# Patient Record
Sex: Female | Born: 1950
Health system: Southern US, Community
[De-identification: ages and names within clinical notes are randomized; demographics above are authoritative.]

## PROBLEM LIST (undated history)

## (undated) DIAGNOSIS — K469 Unspecified abdominal hernia without obstruction or gangrene: Secondary | ICD-10-CM

## (undated) DIAGNOSIS — K743 Primary biliary cirrhosis: Secondary | ICD-10-CM

## (undated) DIAGNOSIS — E039 Hypothyroidism, unspecified: Secondary | ICD-10-CM

## (undated) DIAGNOSIS — I10 Essential (primary) hypertension: Secondary | ICD-10-CM

## (undated) DIAGNOSIS — E079 Disorder of thyroid, unspecified: Secondary | ICD-10-CM

## (undated) DIAGNOSIS — Z1509 Genetic susceptibility to other malignant neoplasm: Secondary | ICD-10-CM

## (undated) DIAGNOSIS — M8580 Other specified disorders of bone density and structure, unspecified site: Secondary | ICD-10-CM

## (undated) DIAGNOSIS — D638 Anemia in other chronic diseases classified elsewhere: Secondary | ICD-10-CM

## (undated) DIAGNOSIS — C4491 Basal cell carcinoma of skin, unspecified: Secondary | ICD-10-CM

## (undated) DIAGNOSIS — K769 Liver disease, unspecified: Secondary | ICD-10-CM

## (undated) DIAGNOSIS — C189 Malignant neoplasm of colon, unspecified: Secondary | ICD-10-CM

## (undated) DIAGNOSIS — Z8679 Personal history of other diseases of the circulatory system: Secondary | ICD-10-CM

## (undated) DIAGNOSIS — K439 Ventral hernia without obstruction or gangrene: Secondary | ICD-10-CM

## (undated) DIAGNOSIS — Z87898 Personal history of other specified conditions: Secondary | ICD-10-CM

## (undated) DIAGNOSIS — Z1507 Genetic susceptibility to malignant neoplasm of urinary tract: Secondary | ICD-10-CM

## (undated) HISTORY — PX: HERNIA REPAIR: SHX51

## (undated) HISTORY — DX: Personal history of other diseases of the circulatory system: Z86.79

## (undated) HISTORY — DX: Malignant neoplasm of colon, unspecified: C18.9

## (undated) HISTORY — DX: Hypothyroidism, unspecified: E03.9

## (undated) HISTORY — DX: Ventral hernia without obstruction or gangrene: K43.9

## (undated) HISTORY — PX: ABDOMINAL HYSTERECTOMY: SHX81

## (undated) HISTORY — PX: UPPER GASTROINTESTINAL ENDOSCOPY: SHX188

## (undated) HISTORY — DX: Other specified disorders of bone density and structure, unspecified site: M85.80

## (undated) HISTORY — PX: EYE SURGERY: SHX253

## (undated) HISTORY — DX: Basal cell carcinoma of skin, unspecified: C44.91

## (undated) HISTORY — DX: Disorder of thyroid, unspecified: E07.9

## (undated) HISTORY — DX: Liver disease, unspecified: K76.9

## (undated) HISTORY — DX: Unspecified abdominal hernia without obstruction or gangrene: K46.9

## (undated) HISTORY — PX: FRACTURE SURGERY: SHX138

## (undated) HISTORY — PX: TYMPANOSTOMY TUBE PLACEMENT: SHX32

## (undated) HISTORY — PX: OTHER SURGICAL HISTORY: SHX169

## (undated) HISTORY — PX: ABDOMINAL HERNIA REPAIR: SHX539

## (undated) HISTORY — PX: COLON RESECTION: SHX5231

## (undated) HISTORY — DX: Personal history of other specified conditions: Z87.898

## (undated) HISTORY — PX: COLONOSCOPY: SHX174

---

## 2000-10-25 ENCOUNTER — Encounter: Admission: RE | Admit: 2000-10-25 | Discharge: 2000-10-25 | Payer: Self-pay | Admitting: Gastroenterology

## 2000-10-25 ENCOUNTER — Encounter: Payer: Self-pay | Admitting: Gastroenterology

## 2000-11-19 ENCOUNTER — Encounter: Payer: Self-pay | Admitting: Gastroenterology

## 2000-11-19 ENCOUNTER — Encounter: Admission: RE | Admit: 2000-11-19 | Discharge: 2000-11-19 | Payer: Self-pay | Admitting: Gastroenterology

## 2000-11-29 ENCOUNTER — Encounter: Payer: Self-pay | Admitting: Gastroenterology

## 2000-11-29 ENCOUNTER — Ambulatory Visit (HOSPITAL_COMMUNITY): Admission: RE | Admit: 2000-11-29 | Discharge: 2000-11-29 | Payer: Self-pay | Admitting: Gastroenterology

## 2004-02-24 ENCOUNTER — Ambulatory Visit (HOSPITAL_COMMUNITY): Admission: RE | Admit: 2004-02-24 | Discharge: 2004-02-24 | Payer: Self-pay | Admitting: Internal Medicine

## 2004-07-31 ENCOUNTER — Ambulatory Visit (HOSPITAL_COMMUNITY): Admission: RE | Admit: 2004-07-31 | Discharge: 2004-07-31 | Payer: Self-pay | Admitting: Internal Medicine

## 2004-08-10 ENCOUNTER — Ambulatory Visit (HOSPITAL_COMMUNITY): Admission: RE | Admit: 2004-08-10 | Discharge: 2004-08-10 | Payer: Self-pay | Admitting: Internal Medicine

## 2004-08-18 ENCOUNTER — Encounter (HOSPITAL_COMMUNITY): Admission: RE | Admit: 2004-08-18 | Discharge: 2004-08-18 | Payer: Self-pay | Admitting: Internal Medicine

## 2004-10-24 ENCOUNTER — Ambulatory Visit: Payer: Self-pay | Admitting: Urgent Care

## 2004-11-12 HISTORY — PX: SPLENECTOMY: SUR1306

## 2005-11-12 HISTORY — PX: CHOLECYSTECTOMY: SHX55

## 2006-11-12 HISTORY — PX: COLON SURGERY: SHX602

## 2010-02-08 ENCOUNTER — Encounter: Payer: Self-pay | Admitting: Internal Medicine

## 2010-12-12 NOTE — Letter (Signed)
Summary: DISABILITY CLAIM  DISABILITY CLAIM   Imported By: Diana Eves 02/08/2010 15:26:03  _____________________________________________________________________  External Attachment:    Type:   Image     Comment:   External Document

## 2011-03-30 NOTE — Op Note (Signed)
NAME:  Cassidy Barber, Cassidy Barber                           ACCOUNT NO.:  [id number]   MEDICAL RECORD NO.:  [id number]                   PATIENT TYPE:  AMB   LOCATION:  DAY                                  FACILITY:  [facility]   PHYSICIAN:  Lionel December, M.D.                 DATE OF BIRTH:  [birth year]   DATE OF PROCEDURE:  02/24/2004  DATE OF DISCHARGE:                                 OPERATIVE REPORT   PROCEDURE:  Esophagogastroduodenoscopy.   ENDOSCOPIST:  Lionel December, M.D.   INDICATIONS:  Cassidy Barber is a 60 year old Caucasian female who was recently  diagnosed to have cirrhosis at the time of repair of incisional hernia at  the site of laparoscopy port.  Histology was consistent with PPC.  Her AMA  and M2 antibody are also positive.  Her INR is normal. Her platelet count is  also normal at 389,000.  Her bilirubin is 0.7, AP is 953, AST at 111, and  ALT is 42.  Albumin is 3.1.  She is here for esophagogastroduodenoscopy to  find out whether or not she has esophageal varices which would have an  impact on her low term prognosis; esophageal and/or gastric varices which  would assist in her overall management and also have prognostic failure.  The procedure and risks were reviewed with the patient and informed consent  was obtained.   PREOPERATIVE MEDICATIONS:  Cetacaine spray for oropharyngeal topical  anesthesia, Demerol 50 mg IV and Versed 8 mg IV in divided dose.   FINDINGS:  Procedure performed in endoscopy suite.  The patient's vital  signs and O2 saturation were monitored during the procedure and remained  stable.  The patient was placed in the left lateral recumbent position and  Olympus videoscope was passed via the oropharynx without any difficulty into  the esophagus.   ESOPHAGUS:  Mucosa of the proximal and middle segment was normal.  One  column of esophageal varices was 10 cm long; it was grade 2.  Perhaps a  little more prominent just above the GE junction.  Two other columns were  much shorter less than 3 cm in length.  The GE junction was at 38 cm.   STOMACH:  It was empty and distended very well with insufflation.  Examination of the mucosa revealed mosaic pattern with edema.  There was  prominence to antral folds with patchy erythema, but no erosions or ulcers  are noted.  The pyloric channel was patent.  Angularis was unremarkable.  No  fundal varices identified.  Multiple tiny polyps are noted at fundal mucosa  and 3 of these were biopsied for histology.  The largest one was 4 mm.  Others were small.  Endoscopically these were suspicious for hyperplastic  polyps.   DUODENUM:  Examination of the bulb and second part of the duodenum was  normal.   Endoscope was withdrawn.  The patient tolerated the procedure well.  FINAL DIAGNOSES:  1. Grade 3 columns of esophageal varices.  Two columns are very short and     the third column is 10-cm long.  2. No evidence of gastric varices.  3. Portal gastropathy.  4. Helicobacter pylori  infection, however, will be ruled out.  5. Multiple small fundal polyps, 3 of which were biopsied for histology.  6. Endoscopically these are suspicious for hyperplastic polyps.  7. Normal examination of the bulb and postbulbar duodenum.   RECOMMENDATIONS:  1. She will continue her __________.  2. I will be contacting the patient with biopsy results.  3. She will have LFTs and H. pylori serology prior to her next visit 3     months' from now.  4. Given endoscopic findings she will be reendoscoped in a year to monitor     progress of these varices.      ___________________________________________                                            Lionel December, M.D.   NR/MEDQ  D:  02/24/2004  T:  02/24/2004  Job:  [id number]   cc:   Henry A. Cleotis Nipper, M.D.  [address]  [place]  [place] [address]  Fax: [phone]   Wyvonnia Lora  [address]  [place]  [place] [address]  Fax: [phone]   Lynett Fish, M.D.  [address] [place]  [place]  [address]  Fax: [phone]

## 2011-03-30 NOTE — Consult Note (Signed)
NAME:  Maire, Govan NO.:  [id number]   MEDICAL RECORD NO.:  [id number]                  PATIENT TYPE:   LOCATION:                                       FACILITY:   PHYSICIAN:  Lionel December, M.D.                 DATE OF BIRTH:  July 20, 1951   DATE OF CONSULTATION:  DATE OF DISCHARGE:                                   CONSULTATION   REASON FOR CONSULTATION:  For management and further evaluation of recently  diagnosed cirrhosis felt to be PBC.   HISTORY OF PRESENT ILLNESS:  The patient is a 60 year old Caucasian female  who is referred through the courtesy of Dr. Cleotis Nipper for evaluation of  recently diagnosed cirrhosis. She is accompanied to our office by her  husband. The patient underwent laparoscopy splenectomy in March 2004 for  ITP. At one point, her platelet count dropped to 10,000, and she required  platelet transfusion. She did not response to prednisone. Dr. Cleotis Nipper  reviewed the liver which looked normal and therefore did not biopsy it. The  patient developed incisional hernia at one of the port sites, and she  therefore had this repaired on January 25, 2004. At the time of the surgery,  she was noted to have obvious changes of cirrhosis to her liver. Biopsy was  therefore obtained. It showed fibrosis, marked lymphoplasmacytic  inflammatory response around and in the bile ducts with ductal  proliferation. Therefore, PBC was suspected. She went on to have  antimitochondrial antibody which is 4.7 (February 04, 2004) which is abnormally  strongly positive. The patient meanwhile denies prior history of jaundice  or hepatitis. Although she does not remember, her husband feels that she had  elevated transaminases four years ago when she went for blood donation. It  is unclear what other studies she had by [employer]. She denies pruritus,  skin rash. She has noted some weakness and fatigue. She has chronic back  pain. She recalls she had EGD over 10  years ago which was normal. Her  appetite is fair. She has not lost any weight recently. Her bowels move  regularly.   She is presently on Synthroid 75 mcg q.d. and Darvocet-N 100 which she  generally takes b.i.d. She is not on any OTC medications.   PAST MEDICAL HISTORY:  She has been hypothyroid for 22 years. She had  splenectomy for refractory ITP in March 2004 and repair of ventral  incisional hernia last month. Chronic low back pain.   She had high risk screening colonoscopy in March 2004 by Dr. Cleotis Nipper which  was within normal limits.   She has been given pneumococcal/meningococcal vaccine prior to her  splenectomy last year. She has not had ARB vaccine.   ALLERGIES:  None known.   FAMILY HISTORY:  Both parents are in good health. Father has been treated  for colon carcinoma when he was 62 and  then again at age 66, and he is 74.  One sister had surgery for colon carcinoma at age 66 when she had part of  colon resected for carcinoma about 5 years ago when she was 60 years old.  She has a brother with CVA but doing fairly well.   SOCIAL HISTORY:  She is married. She has one child in good health. She works  at [employer] in [place]. She does office work. She has never smoked  cigarettes and drinks alcohol very occasionally.   PHYSICAL EXAMINATION:  GENERAL:  Pleasant, well-developed, well-nourished,  Caucasian female who is in no acute distress. She weighs 154 pounds. She is  5 feet 6 inches tall. Pulse 84 per minute, blood pressure 110/70,  temperature 98.0.  HEENT:  Conjunctivae is pink. Sclerae is nonicteric. Oropharyngeal mucosa is  normal.  NECK:  Without masses or thyromegaly. No spider angiomata are noted.  CARDIAC:  Regular rate and rhythm. Normal S1 and S3. No murmur or gallop  noted.  LUNGS:  Clear to auscultation.  ABDOMEN:  Symmetrical. She has well healed laparoscopy wounds. She has some  induration around the site. She had herniorrhaphy a few weeks ago.  Abdomen  is soft. Liver edge is listing below RCM. Span is percussed to be 11 cm.  EXTREMITIES:  She does not have peripheral edema, clubbing, or skin rash.   STUDIES:  Ultrasound from February 09, 2004 revealed mild coarsening to texture  of the liver. Hepatic and portal veins were normal with a normal flow.  Gallbladder was unremarkable. She had tiny amount of fluid adjacent to the  gallbladder. There was no free ascites.   Liver histology as above. I will review slides from Dr. [place]az[place] at a later  date.   I do not have a copy of recent LFTs.   Hepatitis antibody A IgM, hepatitis B surface antigen, hepatitis C antibody  were all negative.   ASSESSMENT:  The patient is a 60 year old Caucasian female who was recently  found to have cirrhosis. Histology shows typical changes of PBC, at least  stage II, maybe III. Her ANA is also positive. She, however, does not have  any symptoms or stigmata of chronic liver disease. Last year, she had  splenectomy for refractory ITP which is a separate issue. I am not aware of  close association of ITP and PBC. Given that she has compensated disease, I  feel her long prognosis is excellent.   RECOMMENDATIONS:  1. She will have CBC, Chem 20, prothrombin time, and M2 antibody __________     lab today.  2. Will schedule her for bone density study.  3. She will undergo esophagogastroduodenoscopy to make sure she does not     have esophageal varices. The presence of varices would indicate less     favorable prognosis.  4. She will also have a baseline bone density study.  5. Esophagogastroduodenoscopy looking for esophageal varices. Based on     clinical evaluation, the index of suspicion is very low that she would     have varices.  6. She needs to be given vaccine for hepatitis A and B and will ask Dr.     Margo Common to assist with this.  7. She will also have bone density. 8. Will start Urso 500 mg p.o. b.i.d., prescription given for three months      with three refills.  9. I have also suggested she should take calcium with vitamin D, at least 1  g of calcium daily, and take one multivitamin with minerals.  10.      We will plan to see her back in the office in six months from now.  11.      I will be contacting the patient with the results of pending     studies.   I would like to thank Dr. Cleotis Nipper for the opportunity to participate in  the care of this nice lady.      ___________________________________________                                            Lionel December, M.D.   NR/MEDQ  D:  02/17/2004  T:  02/18/2004  Job:  [id number]   cc:   Sherilyn Cooter A. Cleotis Nipper, M.D.  [address]  [place]  [place] [address]  Fax: [phone]   Wyvonnia Lora  [address]  [place]  [place] [address]  Fax: [phone]   Lynett Fish, M.D.  [address] [place]  [place] [address]  Fax: [phone]

## 2011-07-23 ENCOUNTER — Other Ambulatory Visit (HOSPITAL_COMMUNITY): Payer: Self-pay | Admitting: Cardiovascular Disease

## 2011-07-23 DIAGNOSIS — Z01810 Encounter for preprocedural cardiovascular examination: Secondary | ICD-10-CM

## 2011-07-24 ENCOUNTER — Ambulatory Visit (HOSPITAL_BASED_OUTPATIENT_CLINIC_OR_DEPARTMENT_OTHER): Payer: Medicare Other | Admitting: Radiology

## 2011-07-24 ENCOUNTER — Ambulatory Visit (HOSPITAL_COMMUNITY): Payer: Medicare Other | Attending: Cardiology | Admitting: Radiology

## 2011-07-24 ENCOUNTER — Other Ambulatory Visit (HOSPITAL_COMMUNITY): Payer: Self-pay | Admitting: Radiology

## 2011-07-24 ENCOUNTER — Encounter (INDEPENDENT_AMBULATORY_CARE_PROVIDER_SITE_OTHER): Payer: Medicare Other

## 2011-07-24 VITALS — Ht 64.0 in | Wt 176.0 lb

## 2011-07-24 DIAGNOSIS — R0609 Other forms of dyspnea: Secondary | ICD-10-CM

## 2011-07-24 DIAGNOSIS — K7460 Unspecified cirrhosis of liver: Secondary | ICD-10-CM | POA: Insufficient documentation

## 2011-07-24 DIAGNOSIS — K745 Biliary cirrhosis, unspecified: Secondary | ICD-10-CM

## 2011-07-24 DIAGNOSIS — Z85038 Personal history of other malignant neoplasm of large intestine: Secondary | ICD-10-CM | POA: Insufficient documentation

## 2011-07-24 DIAGNOSIS — E039 Hypothyroidism, unspecified: Secondary | ICD-10-CM | POA: Insufficient documentation

## 2011-07-24 DIAGNOSIS — I059 Rheumatic mitral valve disease, unspecified: Secondary | ICD-10-CM | POA: Insufficient documentation

## 2011-07-24 DIAGNOSIS — Z01810 Encounter for preprocedural cardiovascular examination: Secondary | ICD-10-CM

## 2011-07-24 DIAGNOSIS — R0989 Other specified symptoms and signs involving the circulatory and respiratory systems: Secondary | ICD-10-CM

## 2011-07-24 DIAGNOSIS — Z01818 Encounter for other preprocedural examination: Secondary | ICD-10-CM

## 2011-07-24 DIAGNOSIS — I079 Rheumatic tricuspid valve disease, unspecified: Secondary | ICD-10-CM | POA: Insufficient documentation

## 2011-07-24 DIAGNOSIS — I379 Nonrheumatic pulmonary valve disorder, unspecified: Secondary | ICD-10-CM | POA: Insufficient documentation

## 2011-07-24 DIAGNOSIS — I10 Essential (primary) hypertension: Secondary | ICD-10-CM | POA: Insufficient documentation

## 2011-07-24 DIAGNOSIS — E669 Obesity, unspecified: Secondary | ICD-10-CM | POA: Insufficient documentation

## 2011-07-24 MED ORDER — REGADENOSON 0.4 MG/5ML IV SOLN: 0.4000 mg | Freq: Once | INTRAVENOUS | Status: DC

## 2011-07-24 MED ORDER — TECHNETIUM TC 99M TETROFOSMIN IV KIT: 33.0000 | PACK | Freq: Once | INTRAVENOUS | Status: DC | PRN

## 2011-07-24 MED ORDER — TECHNETIUM TC 99M TETROFOSMIN IV KIT: 11.0000 | PACK | Freq: Once | INTRAVENOUS | Status: DC | PRN

## 2011-07-24 NOTE — Progress Notes (Signed)
[facility] SITE 3 NUCLEAR MED [address] [place] [address] [phone]  Cardiology Nuclear Med Study  Cassidy Barber is a 60 y.o. female [id number] [birth year]   Nuclear Med Background Indication for Stress Test:  Evaluation for Ischemia and Surgical Clearance: Pending liver transplant History:  '10 Stress Echo:EF=60-65%; '11 [id number] Cardiac Risk Factors: Hypertension and Overweight  Symptoms:  DOE and Fatigue   Nuclear Pre-Procedure Caffeine/Decaff Intake:  None NPO After: 7:00pm   Lungs:  Clear.  O2 Sat 98% on RA. IV 0.9% NS with Angio Cath:  20g  IV Site: R Antecubital  IV Started by:  Irean Hong, RN  Chest Size (in):  36 Cup Size: B  Height: 5\' 4"  (1.626 m)  Weight:  176 lb (79.833 kg)  BMI:  Body mass index is 30.21 kg/(m^2). Tech Comments:  Propranolol held x 24 hours    Nuclear Med Study 1 or 2 day study: 1 day  Stress Test Type:  Treadmill/Lexiscan  Reading MD: Olga Millers, MD  Order Authorizing Provider:  Sandria Senter, MD  Resting Radionuclide: Technetium 81m Tetrofosmin  Resting Radionuclide Dose: 10.8 mCi   Stress Radionuclide:  Technetium 58m Tetrofosmin  Stress Radionuclide Dose: 33.0 mCi           Stress Protocol Rest HR: 59 Stress HR: 87  Rest BP: 105/73 Stress BP: 123/54  Exercise Time (min): 2:00 METS: n/a   Predicted Max HR: 160 bpm % Max HR: 54.38 bpm Rate Pressure Product: 03474   Dose of Adenosine (mg):  n/a Dose of Lexiscan: 0.4 mg  Dose of Atropine (mg): n/a Dose of Dobutamine: n/a mcg/kg/min (at max HR)  Stress Test Technologist: Smiley Houseman, CMA-N  Nuclear Technologist:  Domenic Polite, CNMT     Rest Procedure:  Myocardial perfusion imaging was performed at rest 45 minutes following the intravenous administration of Technetium 53m Tetrofosmin.  Rest ECG: No acute changes.  Stress Procedure:  The patient received IV Lexiscan 0.4 mg over 15-seconds.  Technetium 31m Tetrofosmin injected at  30-seconds.  There were no significant changes with Lexiscan, occasional PAC's.  Quantitative spect images were obtained after a 45 minute delay.  Stress ECG: No significant change from baseline ECG  QPS Raw Data Images:  Acquisition technically good; normal left ventricular size. Stress Images:  Normal homogeneous uptake in all areas of the myocardium. Rest Images:  Normal homogeneous uptake in all areas of the myocardium. Subtraction (SDS):  No evidence of ischemia. Transient Ischemic Dilatation (Normal <1.22):  1.00 Lung/Heart Ratio (Normal <0.45):  0.44  Quantitative Gated Spect Images QGS EDV:  87 ml QGS ESV:  24 ml QGS cine images:  NL LV Function; NL Wall Motion QGS EF: 73%  Impression Exercise Capacity:  Lexiscan with low level exercise. BP Response:  Normal blood pressure response. Clinical Symptoms:  No chest pain. ECG Impression:  No significant ST segment change suggestive of ischemia. Comparison with Prior Nuclear Study: No images to compare  Overall Impression:  Normal stress nuclear study.  Olga Millers

## 2011-07-25 ENCOUNTER — Encounter (HOSPITAL_COMMUNITY): Payer: Self-pay

## 2011-08-16 DIAGNOSIS — I2729 Other secondary pulmonary hypertension: Secondary | ICD-10-CM | POA: Insufficient documentation

## 2011-09-04 DIAGNOSIS — K469 Unspecified abdominal hernia without obstruction or gangrene: Secondary | ICD-10-CM | POA: Insufficient documentation

## 2011-11-16 DIAGNOSIS — K432 Incisional hernia without obstruction or gangrene: Secondary | ICD-10-CM | POA: Diagnosis not present

## 2011-11-16 DIAGNOSIS — K745 Biliary cirrhosis, unspecified: Secondary | ICD-10-CM | POA: Diagnosis not present

## 2011-12-11 DIAGNOSIS — I8510 Secondary esophageal varices without bleeding: Secondary | ICD-10-CM | POA: Diagnosis present

## 2011-12-11 DIAGNOSIS — R918 Other nonspecific abnormal finding of lung field: Secondary | ICD-10-CM | POA: Diagnosis not present

## 2011-12-11 DIAGNOSIS — Z7682 Awaiting organ transplant status: Secondary | ICD-10-CM | POA: Diagnosis not present

## 2011-12-11 DIAGNOSIS — L02219 Cutaneous abscess of trunk, unspecified: Secondary | ICD-10-CM | POA: Diagnosis not present

## 2011-12-11 DIAGNOSIS — K439 Ventral hernia without obstruction or gangrene: Secondary | ICD-10-CM | POA: Diagnosis not present

## 2011-12-11 DIAGNOSIS — R188 Other ascites: Secondary | ICD-10-CM | POA: Diagnosis not present

## 2011-12-11 DIAGNOSIS — T8140XA Infection following a procedure, unspecified, initial encounter: Secondary | ICD-10-CM | POA: Diagnosis not present

## 2011-12-11 DIAGNOSIS — I10 Essential (primary) hypertension: Secondary | ICD-10-CM | POA: Diagnosis present

## 2011-12-11 DIAGNOSIS — E039 Hypothyroidism, unspecified: Secondary | ICD-10-CM | POA: Diagnosis present

## 2011-12-11 DIAGNOSIS — Z9049 Acquired absence of other specified parts of digestive tract: Secondary | ICD-10-CM | POA: Diagnosis not present

## 2011-12-11 DIAGNOSIS — K766 Portal hypertension: Secondary | ICD-10-CM | POA: Diagnosis present

## 2011-12-11 DIAGNOSIS — Z79899 Other long term (current) drug therapy: Secondary | ICD-10-CM | POA: Diagnosis not present

## 2011-12-11 DIAGNOSIS — Z85038 Personal history of other malignant neoplasm of large intestine: Secondary | ICD-10-CM | POA: Diagnosis not present

## 2011-12-11 DIAGNOSIS — K7460 Unspecified cirrhosis of liver: Secondary | ICD-10-CM | POA: Diagnosis not present

## 2011-12-11 DIAGNOSIS — Z9889 Other specified postprocedural states: Secondary | ICD-10-CM | POA: Diagnosis not present

## 2011-12-11 DIAGNOSIS — K745 Biliary cirrhosis, unspecified: Secondary | ICD-10-CM | POA: Diagnosis not present

## 2011-12-11 DIAGNOSIS — Z5181 Encounter for therapeutic drug level monitoring: Secondary | ICD-10-CM | POA: Diagnosis not present

## 2011-12-11 DIAGNOSIS — Z48816 Encounter for surgical aftercare following surgery on the genitourinary system: Secondary | ICD-10-CM | POA: Diagnosis not present

## 2011-12-11 DIAGNOSIS — K319 Disease of stomach and duodenum, unspecified: Secondary | ICD-10-CM | POA: Diagnosis present

## 2011-12-11 DIAGNOSIS — Z452 Encounter for adjustment and management of vascular access device: Secondary | ICD-10-CM | POA: Diagnosis not present

## 2011-12-11 DIAGNOSIS — K8309 Other cholangitis: Secondary | ICD-10-CM | POA: Diagnosis not present

## 2011-12-11 DIAGNOSIS — R935 Abnormal findings on diagnostic imaging of other abdominal regions, including retroperitoneum: Secondary | ICD-10-CM | POA: Diagnosis not present

## 2011-12-11 DIAGNOSIS — J90 Pleural effusion, not elsewhere classified: Secondary | ICD-10-CM | POA: Diagnosis not present

## 2011-12-11 DIAGNOSIS — L03319 Cellulitis of trunk, unspecified: Secondary | ICD-10-CM | POA: Diagnosis not present

## 2011-12-19 DIAGNOSIS — L02219 Cutaneous abscess of trunk, unspecified: Secondary | ICD-10-CM | POA: Diagnosis not present

## 2011-12-19 DIAGNOSIS — Z48816 Encounter for surgical aftercare following surgery on the genitourinary system: Secondary | ICD-10-CM | POA: Diagnosis not present

## 2011-12-19 DIAGNOSIS — K7460 Unspecified cirrhosis of liver: Secondary | ICD-10-CM | POA: Diagnosis not present

## 2011-12-19 DIAGNOSIS — Z5181 Encounter for therapeutic drug level monitoring: Secondary | ICD-10-CM | POA: Diagnosis not present

## 2011-12-19 DIAGNOSIS — Z452 Encounter for adjustment and management of vascular access device: Secondary | ICD-10-CM | POA: Diagnosis not present

## 2011-12-21 DIAGNOSIS — Z48816 Encounter for surgical aftercare following surgery on the genitourinary system: Secondary | ICD-10-CM | POA: Diagnosis not present

## 2011-12-21 DIAGNOSIS — K7460 Unspecified cirrhosis of liver: Secondary | ICD-10-CM | POA: Diagnosis not present

## 2011-12-21 DIAGNOSIS — Z452 Encounter for adjustment and management of vascular access device: Secondary | ICD-10-CM | POA: Diagnosis not present

## 2011-12-21 DIAGNOSIS — Z7901 Long term (current) use of anticoagulants: Secondary | ICD-10-CM | POA: Diagnosis not present

## 2011-12-21 DIAGNOSIS — L02219 Cutaneous abscess of trunk, unspecified: Secondary | ICD-10-CM | POA: Diagnosis not present

## 2011-12-21 DIAGNOSIS — Z792 Long term (current) use of antibiotics: Secondary | ICD-10-CM | POA: Diagnosis not present

## 2011-12-21 DIAGNOSIS — Z5181 Encounter for therapeutic drug level monitoring: Secondary | ICD-10-CM | POA: Diagnosis not present

## 2011-12-26 DIAGNOSIS — Z9889 Other specified postprocedural states: Secondary | ICD-10-CM | POA: Diagnosis not present

## 2011-12-26 DIAGNOSIS — Z452 Encounter for adjustment and management of vascular access device: Secondary | ICD-10-CM | POA: Diagnosis not present

## 2011-12-26 DIAGNOSIS — Z9089 Acquired absence of other organs: Secondary | ICD-10-CM | POA: Diagnosis not present

## 2011-12-26 DIAGNOSIS — K469 Unspecified abdominal hernia without obstruction or gangrene: Secondary | ICD-10-CM | POA: Diagnosis not present

## 2011-12-26 DIAGNOSIS — L02219 Cutaneous abscess of trunk, unspecified: Secondary | ICD-10-CM | POA: Diagnosis not present

## 2011-12-26 DIAGNOSIS — K7460 Unspecified cirrhosis of liver: Secondary | ICD-10-CM | POA: Diagnosis not present

## 2011-12-26 DIAGNOSIS — Z980 Intestinal bypass and anastomosis status: Secondary | ICD-10-CM | POA: Diagnosis not present

## 2011-12-26 DIAGNOSIS — Z5181 Encounter for therapeutic drug level monitoring: Secondary | ICD-10-CM | POA: Diagnosis not present

## 2011-12-26 DIAGNOSIS — Z48816 Encounter for surgical aftercare following surgery on the genitourinary system: Secondary | ICD-10-CM | POA: Diagnosis not present

## 2012-01-02 DIAGNOSIS — L02219 Cutaneous abscess of trunk, unspecified: Secondary | ICD-10-CM | POA: Diagnosis not present

## 2012-01-02 DIAGNOSIS — L03319 Cellulitis of trunk, unspecified: Secondary | ICD-10-CM | POA: Diagnosis not present

## 2012-01-02 DIAGNOSIS — K7460 Unspecified cirrhosis of liver: Secondary | ICD-10-CM | POA: Diagnosis not present

## 2012-01-02 DIAGNOSIS — Z5181 Encounter for therapeutic drug level monitoring: Secondary | ICD-10-CM | POA: Diagnosis not present

## 2012-01-02 DIAGNOSIS — Z452 Encounter for adjustment and management of vascular access device: Secondary | ICD-10-CM | POA: Diagnosis not present

## 2012-01-02 DIAGNOSIS — Z48816 Encounter for surgical aftercare following surgery on the genitourinary system: Secondary | ICD-10-CM | POA: Diagnosis not present

## 2012-01-02 DIAGNOSIS — Z9089 Acquired absence of other organs: Secondary | ICD-10-CM | POA: Diagnosis not present

## 2012-01-09 DIAGNOSIS — L03319 Cellulitis of trunk, unspecified: Secondary | ICD-10-CM | POA: Diagnosis not present

## 2012-01-09 DIAGNOSIS — Z452 Encounter for adjustment and management of vascular access device: Secondary | ICD-10-CM | POA: Diagnosis not present

## 2012-01-09 DIAGNOSIS — Z48816 Encounter for surgical aftercare following surgery on the genitourinary system: Secondary | ICD-10-CM | POA: Diagnosis not present

## 2012-01-09 DIAGNOSIS — K7460 Unspecified cirrhosis of liver: Secondary | ICD-10-CM | POA: Diagnosis not present

## 2012-01-09 DIAGNOSIS — L02219 Cutaneous abscess of trunk, unspecified: Secondary | ICD-10-CM | POA: Diagnosis not present

## 2012-01-09 DIAGNOSIS — Z5181 Encounter for therapeutic drug level monitoring: Secondary | ICD-10-CM | POA: Diagnosis not present

## 2012-01-11 DIAGNOSIS — K7460 Unspecified cirrhosis of liver: Secondary | ICD-10-CM | POA: Diagnosis not present

## 2012-01-11 DIAGNOSIS — K430 Incisional hernia with obstruction, without gangrene: Secondary | ICD-10-CM | POA: Diagnosis not present

## 2012-01-21 DIAGNOSIS — K7460 Unspecified cirrhosis of liver: Secondary | ICD-10-CM | POA: Diagnosis not present

## 2012-02-13 DIAGNOSIS — K7460 Unspecified cirrhosis of liver: Secondary | ICD-10-CM | POA: Diagnosis not present

## 2012-02-18 DIAGNOSIS — K7460 Unspecified cirrhosis of liver: Secondary | ICD-10-CM | POA: Diagnosis not present

## 2012-02-21 DIAGNOSIS — L821 Other seborrheic keratosis: Secondary | ICD-10-CM | POA: Diagnosis not present

## 2012-02-21 DIAGNOSIS — D239 Other benign neoplasm of skin, unspecified: Secondary | ICD-10-CM | POA: Diagnosis not present

## 2012-02-21 DIAGNOSIS — L570 Actinic keratosis: Secondary | ICD-10-CM | POA: Diagnosis not present

## 2012-03-11 DIAGNOSIS — K439 Ventral hernia without obstruction or gangrene: Secondary | ICD-10-CM | POA: Diagnosis not present

## 2012-03-11 DIAGNOSIS — Z9889 Other specified postprocedural states: Secondary | ICD-10-CM | POA: Diagnosis not present

## 2012-03-11 DIAGNOSIS — R188 Other ascites: Secondary | ICD-10-CM | POA: Diagnosis not present

## 2012-03-11 DIAGNOSIS — K469 Unspecified abdominal hernia without obstruction or gangrene: Secondary | ICD-10-CM | POA: Diagnosis not present

## 2012-03-24 ENCOUNTER — Encounter (INDEPENDENT_AMBULATORY_CARE_PROVIDER_SITE_OTHER): Payer: Self-pay | Admitting: Internal Medicine

## 2012-03-24 ENCOUNTER — Ambulatory Visit (INDEPENDENT_AMBULATORY_CARE_PROVIDER_SITE_OTHER): Payer: BLUE CROSS/BLUE SHIELD | Admitting: Internal Medicine

## 2012-03-24 VITALS — BP 110/70 | HR 74 | Temp 98.2°F | Resp 20 | Ht 65.0 in | Wt 147.0 lb

## 2012-03-24 DIAGNOSIS — K745 Biliary cirrhosis, unspecified: Secondary | ICD-10-CM

## 2012-03-24 DIAGNOSIS — R188 Other ascites: Secondary | ICD-10-CM | POA: Diagnosis not present

## 2012-03-24 DIAGNOSIS — E039 Hypothyroidism, unspecified: Secondary | ICD-10-CM | POA: Insufficient documentation

## 2012-03-24 DIAGNOSIS — K432 Incisional hernia without obstruction or gangrene: Secondary | ICD-10-CM | POA: Insufficient documentation

## 2012-03-24 DIAGNOSIS — K743 Primary biliary cirrhosis: Secondary | ICD-10-CM | POA: Insufficient documentation

## 2012-03-24 NOTE — Progress Notes (Signed)
Presenting complaint;  Followup for primary biliary cirrhosis.  History of present illness;  Patient is  61 year old Caucasian female who was diagnosed with PBC in April 2005 when she had laparoscopy for repair of ventral hernia. She had advanced disease on liver biopsy. She was begun on Urso and referred to [facility] [place] for transplant evaluation. As far as her liver disease is concerned she had problems with fluid overload and ascites following her last surgery in November 2012 for hernia repair. She states her fluid overload has been well controlled with medications. She has developed yet another ventral hernia at  right flank. She still has drainage of mucopurulent material from midline abdominal scar/wound. She was advised by her hepatologist to reestablish care locally in case of an emergency. She was seen at [facility] 2 weeks ago. She has EGD and colonoscopy scheduled for next week. She has good appetite and eats multiple small meals. She denies nausea vomiting or dysphagia. She rarely experiences heartburn; her bowels move daily. She denies melena or rectal bleeding. She is using abdominal binder to support hernia in the right flank. This hernia was repaired in November 2012 complicated by wound infection and she was on IV antibiotics in the hospital for 8 days. Her weight has been stable. She denies confusion or somnolence. She had complete blood count 2 weeks ago and her MELD score was 6.   Current Medications: Current Outpatient Prescriptions  Medication Sig Dispense Refill  . Calcium Carbonate-Vitamin D (CALCIUM 600 + D PO) Take by mouth 2 (two) times daily.      . ciprofloxacin (CIPRO) 750 MG tablet Take 750 mg by mouth once a week.      . furosemide (LASIX) 40 MG tablet Take 60 mg by mouth 2 (two) times daily.      [name] GAVILYTE-N WITH FLAVOR PACK 420 G solution       . hydrOXYzine (ATARAX/VISTARIL) 25 MG tablet Take 25 mg by mouth as needed.      [name] levothyroxine  (SYNTHROID, LEVOTHROID) 150 MCG tablet Take 150 mcg by mouth daily.      . potassium chloride (K-DUR) 10 MEQ tablet 10 mEq daily.       . propranolol (INDERAL) 80 MG tablet Take 80 mg by mouth daily.      [name] spironolactone (ALDACTONE) 100 MG tablet Take 150 mg by mouth 2 (two) times daily.      . ursodiol (ACTIGALL) 300 MG capsule Take 300 mg by mouth. The patient states that she is taking 3 in the morning and 3 in the evening      . Zinc 50 MG TABS Take by mouth 2 (two) times daily.       Past medical history; Hypothyroidism for 30 years. Chronic low back pain. History of multiple right-sided colonic polyp one or more with high-grade dysplasia leading to right, colectomy in 2006 or 2007. Splenectomy in March 2004 for ITP. Her platelet count apparently was 10K. CBC was diagnosed in April 2005 as above. Multiple surgeries for ventral herniae(8 or 9 total0. She still has a large hernia and right flank. Most recent surgery was in November 2012 when she had segment of her colon resected because of adhesions. Allergies; Codeine and oxycodone. Family history; Father was diagnosed with colon carcinoma at age 33 and had second primary at 22 died of pancreatic carcinoma at age 67. Mother is 57 years old and in good health. One brother died at age 55. Cause unknown but he drank  too much alcohol. Sister had surgery for colon carcinoma at age 59 and is fine at age 81. Social history; She's married. Has a 76 year old son in good health. She worked at [employer] for 30 years but now on disability. She has never smoked cigarettes. Has had a few drinks of alcohol in her lifetime but none since March 2005.  Objective: Blood pressure 110/70, pulse 74, temperature 98.2 F (36.8 C), temperature source Oral, resp. rate 20, height 5\' 5"  (1.651 m), weight 147 lb (66.679 kg).  Conjunctiva is pink. Sclera is nonicteric Oropharyngeal mucosa is normal. No neck masses or thyromegaly noted. Cardiac exam with  regular rhythm normal S1 and S2. No murmur or gallop noted. Lungs are clear to auscultation. Abdomen;  No LE edema or clubbing noted.  Labs/studies Results:  Assessment:  As far as patient's PBC is concerned she appears to be doing well. She developed ascites following her last surgery November 2012 for hernia repair. Clinically she does not have ascites. I believe diuretic dose could be reduced. Since she has an appointment at [facility] next week I would like her to discuss diuretic therapy with her hepatologist.    Plan:. EGD and colonoscopy at [facility] as planned. Consider diuretic dose reduction. Will talk with her hepatologist at [facility] next week Office visit in 3 months unless new symptoms develop.

## 2012-03-24 NOTE — Patient Instructions (Signed)
Check with your physician at [facility] if furosemide or spironolactone does could be reviewed.

## 2012-04-03 DIAGNOSIS — B999 Unspecified infectious disease: Secondary | ICD-10-CM | POA: Diagnosis not present

## 2012-04-03 DIAGNOSIS — K31819 Angiodysplasia of stomach and duodenum without bleeding: Secondary | ICD-10-CM | POA: Diagnosis not present

## 2012-04-03 DIAGNOSIS — Z980 Intestinal bypass and anastomosis status: Secondary | ICD-10-CM | POA: Diagnosis not present

## 2012-04-03 DIAGNOSIS — I8510 Secondary esophageal varices without bleeding: Secondary | ICD-10-CM | POA: Diagnosis not present

## 2012-04-03 DIAGNOSIS — K7460 Unspecified cirrhosis of liver: Secondary | ICD-10-CM | POA: Diagnosis not present

## 2012-04-03 DIAGNOSIS — Z86010 Personal history of colonic polyps: Secondary | ICD-10-CM | POA: Diagnosis not present

## 2012-04-03 DIAGNOSIS — D126 Benign neoplasm of colon, unspecified: Secondary | ICD-10-CM | POA: Diagnosis not present

## 2012-04-03 DIAGNOSIS — T8579XA Infection and inflammatory reaction due to other internal prosthetic devices, implants and grafts, initial encounter: Secondary | ICD-10-CM | POA: Diagnosis not present

## 2012-04-22 ENCOUNTER — Encounter (INDEPENDENT_AMBULATORY_CARE_PROVIDER_SITE_OTHER): Payer: Self-pay

## 2012-06-16 DIAGNOSIS — Z800 Family history of malignant neoplasm of digestive organs: Secondary | ICD-10-CM | POA: Diagnosis not present

## 2012-06-16 DIAGNOSIS — K7460 Unspecified cirrhosis of liver: Secondary | ICD-10-CM | POA: Diagnosis not present

## 2012-06-16 DIAGNOSIS — T8579XA Infection and inflammatory reaction due to other internal prosthetic devices, implants and grafts, initial encounter: Secondary | ICD-10-CM | POA: Diagnosis not present

## 2012-06-16 DIAGNOSIS — I8500 Esophageal varices without bleeding: Secondary | ICD-10-CM | POA: Diagnosis not present

## 2012-06-16 DIAGNOSIS — K469 Unspecified abdominal hernia without obstruction or gangrene: Secondary | ICD-10-CM | POA: Diagnosis not present

## 2012-06-16 DIAGNOSIS — E039 Hypothyroidism, unspecified: Secondary | ICD-10-CM | POA: Diagnosis not present

## 2012-06-24 ENCOUNTER — Ambulatory Visit (INDEPENDENT_AMBULATORY_CARE_PROVIDER_SITE_OTHER): Payer: Medicare Other | Admitting: Internal Medicine

## 2012-07-30 DIAGNOSIS — K7460 Unspecified cirrhosis of liver: Secondary | ICD-10-CM | POA: Diagnosis not present

## 2012-07-30 DIAGNOSIS — Z79899 Other long term (current) drug therapy: Secondary | ICD-10-CM | POA: Diagnosis not present

## 2012-07-30 DIAGNOSIS — B958 Unspecified staphylococcus as the cause of diseases classified elsewhere: Secondary | ICD-10-CM | POA: Diagnosis not present

## 2012-07-30 DIAGNOSIS — A491 Streptococcal infection, unspecified site: Secondary | ICD-10-CM | POA: Diagnosis not present

## 2012-07-30 DIAGNOSIS — L02219 Cutaneous abscess of trunk, unspecified: Secondary | ICD-10-CM | POA: Diagnosis not present

## 2012-07-30 DIAGNOSIS — B9689 Other specified bacterial agents as the cause of diseases classified elsewhere: Secondary | ICD-10-CM | POA: Diagnosis not present

## 2012-07-30 DIAGNOSIS — Z85038 Personal history of other malignant neoplasm of large intestine: Secondary | ICD-10-CM | POA: Diagnosis not present

## 2012-07-30 DIAGNOSIS — L03319 Cellulitis of trunk, unspecified: Secondary | ICD-10-CM | POA: Diagnosis not present

## 2012-07-30 DIAGNOSIS — L98499 Non-pressure chronic ulcer of skin of other sites with unspecified severity: Secondary | ICD-10-CM | POA: Diagnosis not present

## 2012-07-30 DIAGNOSIS — T8579XA Infection and inflammatory reaction due to other internal prosthetic devices, implants and grafts, initial encounter: Secondary | ICD-10-CM | POA: Diagnosis not present

## 2012-08-02 DIAGNOSIS — L03319 Cellulitis of trunk, unspecified: Secondary | ICD-10-CM | POA: Diagnosis not present

## 2012-08-02 DIAGNOSIS — Z85038 Personal history of other malignant neoplasm of large intestine: Secondary | ICD-10-CM | POA: Diagnosis not present

## 2012-08-02 DIAGNOSIS — T8140XA Infection following a procedure, unspecified, initial encounter: Secondary | ICD-10-CM | POA: Diagnosis not present

## 2012-08-02 DIAGNOSIS — L02219 Cutaneous abscess of trunk, unspecified: Secondary | ICD-10-CM | POA: Diagnosis not present

## 2012-08-02 DIAGNOSIS — K7460 Unspecified cirrhosis of liver: Secondary | ICD-10-CM | POA: Diagnosis not present

## 2012-08-04 DIAGNOSIS — L03319 Cellulitis of trunk, unspecified: Secondary | ICD-10-CM | POA: Diagnosis not present

## 2012-08-04 DIAGNOSIS — L02219 Cutaneous abscess of trunk, unspecified: Secondary | ICD-10-CM | POA: Diagnosis not present

## 2012-08-04 DIAGNOSIS — K7460 Unspecified cirrhosis of liver: Secondary | ICD-10-CM | POA: Diagnosis not present

## 2012-08-04 DIAGNOSIS — T8140XA Infection following a procedure, unspecified, initial encounter: Secondary | ICD-10-CM | POA: Diagnosis not present

## 2012-08-04 DIAGNOSIS — Z85038 Personal history of other malignant neoplasm of large intestine: Secondary | ICD-10-CM | POA: Diagnosis not present

## 2012-08-06 DIAGNOSIS — Z85038 Personal history of other malignant neoplasm of large intestine: Secondary | ICD-10-CM | POA: Diagnosis not present

## 2012-08-06 DIAGNOSIS — A491 Streptococcal infection, unspecified site: Secondary | ICD-10-CM | POA: Diagnosis not present

## 2012-08-06 DIAGNOSIS — K7460 Unspecified cirrhosis of liver: Secondary | ICD-10-CM | POA: Diagnosis not present

## 2012-08-06 DIAGNOSIS — B958 Unspecified staphylococcus as the cause of diseases classified elsewhere: Secondary | ICD-10-CM | POA: Diagnosis not present

## 2012-08-06 DIAGNOSIS — B9689 Other specified bacterial agents as the cause of diseases classified elsewhere: Secondary | ICD-10-CM | POA: Diagnosis not present

## 2012-08-06 DIAGNOSIS — T8579XA Infection and inflammatory reaction due to other internal prosthetic devices, implants and grafts, initial encounter: Secondary | ICD-10-CM | POA: Diagnosis not present

## 2012-08-07 DIAGNOSIS — L02219 Cutaneous abscess of trunk, unspecified: Secondary | ICD-10-CM | POA: Diagnosis not present

## 2012-08-07 DIAGNOSIS — Z85038 Personal history of other malignant neoplasm of large intestine: Secondary | ICD-10-CM | POA: Diagnosis not present

## 2012-08-07 DIAGNOSIS — T8140XA Infection following a procedure, unspecified, initial encounter: Secondary | ICD-10-CM | POA: Diagnosis not present

## 2012-08-07 DIAGNOSIS — K7460 Unspecified cirrhosis of liver: Secondary | ICD-10-CM | POA: Diagnosis not present

## 2012-08-16 DIAGNOSIS — T8140XA Infection following a procedure, unspecified, initial encounter: Secondary | ICD-10-CM | POA: Diagnosis not present

## 2012-08-16 DIAGNOSIS — L02219 Cutaneous abscess of trunk, unspecified: Secondary | ICD-10-CM | POA: Diagnosis not present

## 2012-08-16 DIAGNOSIS — K7460 Unspecified cirrhosis of liver: Secondary | ICD-10-CM | POA: Diagnosis not present

## 2012-08-16 DIAGNOSIS — Z85038 Personal history of other malignant neoplasm of large intestine: Secondary | ICD-10-CM | POA: Diagnosis not present

## 2012-08-20 DIAGNOSIS — L02219 Cutaneous abscess of trunk, unspecified: Secondary | ICD-10-CM | POA: Diagnosis not present

## 2012-08-20 DIAGNOSIS — B958 Unspecified staphylococcus as the cause of diseases classified elsewhere: Secondary | ICD-10-CM | POA: Diagnosis not present

## 2012-08-20 DIAGNOSIS — M795 Residual foreign body in soft tissue: Secondary | ICD-10-CM | POA: Diagnosis not present

## 2012-08-20 DIAGNOSIS — E8809 Other disorders of plasma-protein metabolism, not elsewhere classified: Secondary | ICD-10-CM | POA: Diagnosis not present

## 2012-08-20 DIAGNOSIS — T8130XA Disruption of wound, unspecified, initial encounter: Secondary | ICD-10-CM | POA: Diagnosis not present

## 2012-08-20 DIAGNOSIS — D638 Anemia in other chronic diseases classified elsewhere: Secondary | ICD-10-CM | POA: Diagnosis not present

## 2012-08-20 DIAGNOSIS — L98499 Non-pressure chronic ulcer of skin of other sites with unspecified severity: Secondary | ICD-10-CM | POA: Diagnosis not present

## 2012-08-20 DIAGNOSIS — L03319 Cellulitis of trunk, unspecified: Secondary | ICD-10-CM | POA: Diagnosis not present

## 2012-08-20 DIAGNOSIS — Z1889 Other specified retained foreign body fragments: Secondary | ICD-10-CM | POA: Diagnosis not present

## 2012-08-20 DIAGNOSIS — T8140XA Infection following a procedure, unspecified, initial encounter: Secondary | ICD-10-CM | POA: Diagnosis not present

## 2012-08-20 DIAGNOSIS — T8131XA Disruption of external operation (surgical) wound, not elsewhere classified, initial encounter: Secondary | ICD-10-CM | POA: Diagnosis not present

## 2012-08-20 DIAGNOSIS — A491 Streptococcal infection, unspecified site: Secondary | ICD-10-CM | POA: Diagnosis not present

## 2012-08-22 DIAGNOSIS — K7460 Unspecified cirrhosis of liver: Secondary | ICD-10-CM | POA: Diagnosis not present

## 2012-08-22 DIAGNOSIS — T8140XA Infection following a procedure, unspecified, initial encounter: Secondary | ICD-10-CM | POA: Diagnosis not present

## 2012-08-22 DIAGNOSIS — L02219 Cutaneous abscess of trunk, unspecified: Secondary | ICD-10-CM | POA: Diagnosis not present

## 2012-08-22 DIAGNOSIS — Z85038 Personal history of other malignant neoplasm of large intestine: Secondary | ICD-10-CM | POA: Diagnosis not present

## 2012-08-26 DIAGNOSIS — Z85038 Personal history of other malignant neoplasm of large intestine: Secondary | ICD-10-CM | POA: Diagnosis not present

## 2012-08-26 DIAGNOSIS — T8140XA Infection following a procedure, unspecified, initial encounter: Secondary | ICD-10-CM | POA: Diagnosis not present

## 2012-08-26 DIAGNOSIS — L03319 Cellulitis of trunk, unspecified: Secondary | ICD-10-CM | POA: Diagnosis not present

## 2012-08-26 DIAGNOSIS — K7460 Unspecified cirrhosis of liver: Secondary | ICD-10-CM | POA: Diagnosis not present

## 2012-08-26 DIAGNOSIS — L02219 Cutaneous abscess of trunk, unspecified: Secondary | ICD-10-CM | POA: Diagnosis not present

## 2012-08-29 DIAGNOSIS — L03319 Cellulitis of trunk, unspecified: Secondary | ICD-10-CM | POA: Diagnosis not present

## 2012-08-29 DIAGNOSIS — L02219 Cutaneous abscess of trunk, unspecified: Secondary | ICD-10-CM | POA: Diagnosis not present

## 2012-08-29 DIAGNOSIS — K7460 Unspecified cirrhosis of liver: Secondary | ICD-10-CM | POA: Diagnosis not present

## 2012-08-29 DIAGNOSIS — T8140XA Infection following a procedure, unspecified, initial encounter: Secondary | ICD-10-CM | POA: Diagnosis not present

## 2012-08-29 DIAGNOSIS — Z85038 Personal history of other malignant neoplasm of large intestine: Secondary | ICD-10-CM | POA: Diagnosis not present

## 2012-09-01 DIAGNOSIS — Z1231 Encounter for screening mammogram for malignant neoplasm of breast: Secondary | ICD-10-CM | POA: Diagnosis not present

## 2012-09-02 DIAGNOSIS — L02219 Cutaneous abscess of trunk, unspecified: Secondary | ICD-10-CM | POA: Diagnosis not present

## 2012-09-02 DIAGNOSIS — T8140XA Infection following a procedure, unspecified, initial encounter: Secondary | ICD-10-CM | POA: Diagnosis not present

## 2012-09-02 DIAGNOSIS — K7460 Unspecified cirrhosis of liver: Secondary | ICD-10-CM | POA: Diagnosis not present

## 2012-09-02 DIAGNOSIS — Z85038 Personal history of other malignant neoplasm of large intestine: Secondary | ICD-10-CM | POA: Diagnosis not present

## 2012-09-04 DIAGNOSIS — T8140XA Infection following a procedure, unspecified, initial encounter: Secondary | ICD-10-CM | POA: Diagnosis not present

## 2012-09-04 DIAGNOSIS — L02219 Cutaneous abscess of trunk, unspecified: Secondary | ICD-10-CM | POA: Diagnosis not present

## 2012-09-04 DIAGNOSIS — M795 Residual foreign body in soft tissue: Secondary | ICD-10-CM | POA: Diagnosis not present

## 2012-09-04 DIAGNOSIS — L03319 Cellulitis of trunk, unspecified: Secondary | ICD-10-CM | POA: Diagnosis not present

## 2012-09-04 DIAGNOSIS — B958 Unspecified staphylococcus as the cause of diseases classified elsewhere: Secondary | ICD-10-CM | POA: Diagnosis not present

## 2012-09-04 DIAGNOSIS — T8130XA Disruption of wound, unspecified, initial encounter: Secondary | ICD-10-CM | POA: Diagnosis not present

## 2012-09-04 DIAGNOSIS — L98499 Non-pressure chronic ulcer of skin of other sites with unspecified severity: Secondary | ICD-10-CM | POA: Diagnosis not present

## 2012-09-05 DIAGNOSIS — L02219 Cutaneous abscess of trunk, unspecified: Secondary | ICD-10-CM | POA: Diagnosis not present

## 2012-09-05 DIAGNOSIS — K7460 Unspecified cirrhosis of liver: Secondary | ICD-10-CM | POA: Diagnosis not present

## 2012-09-05 DIAGNOSIS — Z85038 Personal history of other malignant neoplasm of large intestine: Secondary | ICD-10-CM | POA: Diagnosis not present

## 2012-09-05 DIAGNOSIS — T8140XA Infection following a procedure, unspecified, initial encounter: Secondary | ICD-10-CM | POA: Diagnosis not present

## 2012-09-08 DIAGNOSIS — L02219 Cutaneous abscess of trunk, unspecified: Secondary | ICD-10-CM | POA: Diagnosis not present

## 2012-09-08 DIAGNOSIS — T8140XA Infection following a procedure, unspecified, initial encounter: Secondary | ICD-10-CM | POA: Diagnosis not present

## 2012-09-08 DIAGNOSIS — K7460 Unspecified cirrhosis of liver: Secondary | ICD-10-CM | POA: Diagnosis not present

## 2012-09-08 DIAGNOSIS — Z85038 Personal history of other malignant neoplasm of large intestine: Secondary | ICD-10-CM | POA: Diagnosis not present

## 2012-09-11 DIAGNOSIS — T8140XA Infection following a procedure, unspecified, initial encounter: Secondary | ICD-10-CM | POA: Diagnosis not present

## 2012-09-11 DIAGNOSIS — L98499 Non-pressure chronic ulcer of skin of other sites with unspecified severity: Secondary | ICD-10-CM | POA: Diagnosis not present

## 2012-09-11 DIAGNOSIS — M795 Residual foreign body in soft tissue: Secondary | ICD-10-CM | POA: Diagnosis not present

## 2012-09-11 DIAGNOSIS — B958 Unspecified staphylococcus as the cause of diseases classified elsewhere: Secondary | ICD-10-CM | POA: Diagnosis not present

## 2012-09-11 DIAGNOSIS — L02219 Cutaneous abscess of trunk, unspecified: Secondary | ICD-10-CM | POA: Diagnosis not present

## 2012-09-11 DIAGNOSIS — Z182 Retained plastic fragments: Secondary | ICD-10-CM | POA: Diagnosis not present

## 2012-09-11 DIAGNOSIS — T8130XA Disruption of wound, unspecified, initial encounter: Secondary | ICD-10-CM | POA: Diagnosis not present

## 2012-09-11 DIAGNOSIS — S31109A Unspecified open wound of abdominal wall, unspecified quadrant without penetration into peritoneal cavity, initial encounter: Secondary | ICD-10-CM | POA: Diagnosis not present

## 2012-09-12 DIAGNOSIS — K7460 Unspecified cirrhosis of liver: Secondary | ICD-10-CM | POA: Diagnosis not present

## 2012-09-12 DIAGNOSIS — Z85038 Personal history of other malignant neoplasm of large intestine: Secondary | ICD-10-CM | POA: Diagnosis not present

## 2012-09-12 DIAGNOSIS — L02219 Cutaneous abscess of trunk, unspecified: Secondary | ICD-10-CM | POA: Diagnosis not present

## 2012-09-12 DIAGNOSIS — T8140XA Infection following a procedure, unspecified, initial encounter: Secondary | ICD-10-CM | POA: Diagnosis not present

## 2012-09-15 DIAGNOSIS — K7460 Unspecified cirrhosis of liver: Secondary | ICD-10-CM | POA: Diagnosis not present

## 2012-09-15 DIAGNOSIS — T8140XA Infection following a procedure, unspecified, initial encounter: Secondary | ICD-10-CM | POA: Diagnosis not present

## 2012-09-15 DIAGNOSIS — L02219 Cutaneous abscess of trunk, unspecified: Secondary | ICD-10-CM | POA: Diagnosis not present

## 2012-09-15 DIAGNOSIS — Z85038 Personal history of other malignant neoplasm of large intestine: Secondary | ICD-10-CM | POA: Diagnosis not present

## 2012-09-17 DIAGNOSIS — Z85038 Personal history of other malignant neoplasm of large intestine: Secondary | ICD-10-CM | POA: Diagnosis not present

## 2012-09-17 DIAGNOSIS — K7460 Unspecified cirrhosis of liver: Secondary | ICD-10-CM | POA: Diagnosis not present

## 2012-09-17 DIAGNOSIS — L02219 Cutaneous abscess of trunk, unspecified: Secondary | ICD-10-CM | POA: Diagnosis not present

## 2012-09-17 DIAGNOSIS — L03319 Cellulitis of trunk, unspecified: Secondary | ICD-10-CM | POA: Diagnosis not present

## 2012-09-17 DIAGNOSIS — T8140XA Infection following a procedure, unspecified, initial encounter: Secondary | ICD-10-CM | POA: Diagnosis not present

## 2012-09-19 DIAGNOSIS — K7460 Unspecified cirrhosis of liver: Secondary | ICD-10-CM | POA: Diagnosis not present

## 2012-09-19 DIAGNOSIS — Z85038 Personal history of other malignant neoplasm of large intestine: Secondary | ICD-10-CM | POA: Diagnosis not present

## 2012-09-19 DIAGNOSIS — L03319 Cellulitis of trunk, unspecified: Secondary | ICD-10-CM | POA: Diagnosis not present

## 2012-09-19 DIAGNOSIS — L02219 Cutaneous abscess of trunk, unspecified: Secondary | ICD-10-CM | POA: Diagnosis not present

## 2012-09-19 DIAGNOSIS — T8140XA Infection following a procedure, unspecified, initial encounter: Secondary | ICD-10-CM | POA: Diagnosis not present

## 2012-09-23 DIAGNOSIS — K7460 Unspecified cirrhosis of liver: Secondary | ICD-10-CM | POA: Diagnosis not present

## 2012-09-23 DIAGNOSIS — Z85038 Personal history of other malignant neoplasm of large intestine: Secondary | ICD-10-CM | POA: Diagnosis not present

## 2012-09-23 DIAGNOSIS — L02219 Cutaneous abscess of trunk, unspecified: Secondary | ICD-10-CM | POA: Diagnosis not present

## 2012-09-23 DIAGNOSIS — L03319 Cellulitis of trunk, unspecified: Secondary | ICD-10-CM | POA: Diagnosis not present

## 2012-09-23 DIAGNOSIS — T8140XA Infection following a procedure, unspecified, initial encounter: Secondary | ICD-10-CM | POA: Diagnosis not present

## 2012-09-26 DIAGNOSIS — K7460 Unspecified cirrhosis of liver: Secondary | ICD-10-CM | POA: Diagnosis not present

## 2012-09-26 DIAGNOSIS — Z85038 Personal history of other malignant neoplasm of large intestine: Secondary | ICD-10-CM | POA: Diagnosis not present

## 2012-09-26 DIAGNOSIS — L03319 Cellulitis of trunk, unspecified: Secondary | ICD-10-CM | POA: Diagnosis not present

## 2012-09-26 DIAGNOSIS — T8140XA Infection following a procedure, unspecified, initial encounter: Secondary | ICD-10-CM | POA: Diagnosis not present

## 2012-09-26 DIAGNOSIS — L02219 Cutaneous abscess of trunk, unspecified: Secondary | ICD-10-CM | POA: Diagnosis not present

## 2012-09-30 DIAGNOSIS — T8140XA Infection following a procedure, unspecified, initial encounter: Secondary | ICD-10-CM | POA: Diagnosis not present

## 2012-09-30 DIAGNOSIS — Z85038 Personal history of other malignant neoplasm of large intestine: Secondary | ICD-10-CM | POA: Diagnosis not present

## 2012-09-30 DIAGNOSIS — K7460 Unspecified cirrhosis of liver: Secondary | ICD-10-CM | POA: Diagnosis not present

## 2012-09-30 DIAGNOSIS — L03319 Cellulitis of trunk, unspecified: Secondary | ICD-10-CM | POA: Diagnosis not present

## 2012-09-30 DIAGNOSIS — T8579XA Infection and inflammatory reaction due to other internal prosthetic devices, implants and grafts, initial encounter: Secondary | ICD-10-CM | POA: Diagnosis not present

## 2012-09-30 DIAGNOSIS — L98499 Non-pressure chronic ulcer of skin of other sites with unspecified severity: Secondary | ICD-10-CM | POA: Diagnosis not present

## 2012-09-30 DIAGNOSIS — L02219 Cutaneous abscess of trunk, unspecified: Secondary | ICD-10-CM | POA: Diagnosis not present

## 2012-09-30 DIAGNOSIS — T8131XA Disruption of external operation (surgical) wound, not elsewhere classified, initial encounter: Secondary | ICD-10-CM | POA: Diagnosis not present

## 2012-09-30 DIAGNOSIS — K439 Ventral hernia without obstruction or gangrene: Secondary | ICD-10-CM | POA: Diagnosis not present

## 2012-09-30 DIAGNOSIS — B999 Unspecified infectious disease: Secondary | ICD-10-CM | POA: Diagnosis not present

## 2012-09-30 DIAGNOSIS — Z1889 Other specified retained foreign body fragments: Secondary | ICD-10-CM | POA: Diagnosis not present

## 2012-10-01 DIAGNOSIS — K7460 Unspecified cirrhosis of liver: Secondary | ICD-10-CM | POA: Diagnosis not present

## 2012-10-01 DIAGNOSIS — L02219 Cutaneous abscess of trunk, unspecified: Secondary | ICD-10-CM | POA: Diagnosis not present

## 2012-10-01 DIAGNOSIS — T8140XA Infection following a procedure, unspecified, initial encounter: Secondary | ICD-10-CM | POA: Diagnosis not present

## 2012-10-01 DIAGNOSIS — Z85038 Personal history of other malignant neoplasm of large intestine: Secondary | ICD-10-CM | POA: Diagnosis not present

## 2012-10-28 DIAGNOSIS — T8140XA Infection following a procedure, unspecified, initial encounter: Secondary | ICD-10-CM | POA: Diagnosis not present

## 2012-10-28 DIAGNOSIS — L03319 Cellulitis of trunk, unspecified: Secondary | ICD-10-CM | POA: Diagnosis not present

## 2012-10-28 DIAGNOSIS — T85898A Other specified complication of other internal prosthetic devices, implants and grafts, initial encounter: Secondary | ICD-10-CM | POA: Diagnosis not present

## 2012-10-28 DIAGNOSIS — Z1889 Other specified retained foreign body fragments: Secondary | ICD-10-CM | POA: Diagnosis not present

## 2012-10-28 DIAGNOSIS — M795 Residual foreign body in soft tissue: Secondary | ICD-10-CM | POA: Diagnosis not present

## 2012-10-28 DIAGNOSIS — T8131XA Disruption of external operation (surgical) wound, not elsewhere classified, initial encounter: Secondary | ICD-10-CM | POA: Diagnosis not present

## 2012-10-28 DIAGNOSIS — L02219 Cutaneous abscess of trunk, unspecified: Secondary | ICD-10-CM | POA: Diagnosis not present

## 2012-11-11 DIAGNOSIS — T8131XA Disruption of external operation (surgical) wound, not elsewhere classified, initial encounter: Secondary | ICD-10-CM | POA: Diagnosis not present

## 2012-11-11 DIAGNOSIS — Z182 Retained plastic fragments: Secondary | ICD-10-CM | POA: Diagnosis not present

## 2012-11-11 DIAGNOSIS — M795 Residual foreign body in soft tissue: Secondary | ICD-10-CM | POA: Diagnosis not present

## 2012-11-11 DIAGNOSIS — T8140XA Infection following a procedure, unspecified, initial encounter: Secondary | ICD-10-CM | POA: Diagnosis not present

## 2012-11-11 DIAGNOSIS — Z1889 Other specified retained foreign body fragments: Secondary | ICD-10-CM | POA: Diagnosis not present

## 2012-11-11 DIAGNOSIS — T85898A Other specified complication of other internal prosthetic devices, implants and grafts, initial encounter: Secondary | ICD-10-CM | POA: Diagnosis not present

## 2012-11-11 DIAGNOSIS — L02219 Cutaneous abscess of trunk, unspecified: Secondary | ICD-10-CM | POA: Diagnosis not present

## 2012-11-25 DIAGNOSIS — T8131XA Disruption of external operation (surgical) wound, not elsewhere classified, initial encounter: Secondary | ICD-10-CM | POA: Diagnosis not present

## 2012-11-25 DIAGNOSIS — T8130XA Disruption of wound, unspecified, initial encounter: Secondary | ICD-10-CM | POA: Diagnosis not present

## 2012-11-30 DIAGNOSIS — Z85038 Personal history of other malignant neoplasm of large intestine: Secondary | ICD-10-CM | POA: Diagnosis not present

## 2012-11-30 DIAGNOSIS — K7460 Unspecified cirrhosis of liver: Secondary | ICD-10-CM | POA: Diagnosis not present

## 2012-11-30 DIAGNOSIS — T8140XA Infection following a procedure, unspecified, initial encounter: Secondary | ICD-10-CM | POA: Diagnosis not present

## 2012-11-30 DIAGNOSIS — L02219 Cutaneous abscess of trunk, unspecified: Secondary | ICD-10-CM | POA: Diagnosis not present

## 2012-12-03 DIAGNOSIS — T8140XA Infection following a procedure, unspecified, initial encounter: Secondary | ICD-10-CM | POA: Diagnosis not present

## 2012-12-03 DIAGNOSIS — K7460 Unspecified cirrhosis of liver: Secondary | ICD-10-CM | POA: Diagnosis not present

## 2012-12-03 DIAGNOSIS — Z85038 Personal history of other malignant neoplasm of large intestine: Secondary | ICD-10-CM | POA: Diagnosis not present

## 2012-12-03 DIAGNOSIS — L02219 Cutaneous abscess of trunk, unspecified: Secondary | ICD-10-CM | POA: Diagnosis not present

## 2012-12-10 DIAGNOSIS — K7460 Unspecified cirrhosis of liver: Secondary | ICD-10-CM | POA: Diagnosis not present

## 2012-12-10 DIAGNOSIS — Z85038 Personal history of other malignant neoplasm of large intestine: Secondary | ICD-10-CM | POA: Diagnosis not present

## 2012-12-10 DIAGNOSIS — L02219 Cutaneous abscess of trunk, unspecified: Secondary | ICD-10-CM | POA: Diagnosis not present

## 2012-12-10 DIAGNOSIS — T8140XA Infection following a procedure, unspecified, initial encounter: Secondary | ICD-10-CM | POA: Diagnosis not present

## 2012-12-18 DIAGNOSIS — L02219 Cutaneous abscess of trunk, unspecified: Secondary | ICD-10-CM | POA: Diagnosis not present

## 2012-12-18 DIAGNOSIS — T8140XA Infection following a procedure, unspecified, initial encounter: Secondary | ICD-10-CM | POA: Diagnosis not present

## 2012-12-18 DIAGNOSIS — L03319 Cellulitis of trunk, unspecified: Secondary | ICD-10-CM | POA: Diagnosis not present

## 2012-12-18 DIAGNOSIS — Z85038 Personal history of other malignant neoplasm of large intestine: Secondary | ICD-10-CM | POA: Diagnosis not present

## 2012-12-18 DIAGNOSIS — K7460 Unspecified cirrhosis of liver: Secondary | ICD-10-CM | POA: Diagnosis not present

## 2012-12-23 ENCOUNTER — Encounter (INDEPENDENT_AMBULATORY_CARE_PROVIDER_SITE_OTHER): Payer: Self-pay | Admitting: Internal Medicine

## 2012-12-23 ENCOUNTER — Ambulatory Visit (INDEPENDENT_AMBULATORY_CARE_PROVIDER_SITE_OTHER): Payer: Medicare Other | Admitting: Internal Medicine

## 2012-12-23 VITALS — BP 102/68 | HR 74 | Temp 97.4°F | Resp 18 | Ht 65.0 in | Wt 148.0 lb

## 2012-12-23 DIAGNOSIS — R109 Unspecified abdominal pain: Secondary | ICD-10-CM | POA: Diagnosis not present

## 2012-12-23 DIAGNOSIS — K745 Biliary cirrhosis, unspecified: Secondary | ICD-10-CM | POA: Diagnosis not present

## 2012-12-23 DIAGNOSIS — K439 Ventral hernia without obstruction or gangrene: Secondary | ICD-10-CM

## 2012-12-23 DIAGNOSIS — L089 Local infection of the skin and subcutaneous tissue, unspecified: Secondary | ICD-10-CM | POA: Diagnosis not present

## 2012-12-23 DIAGNOSIS — K738 Other chronic hepatitis, not elsewhere classified: Secondary | ICD-10-CM | POA: Diagnosis not present

## 2012-12-23 DIAGNOSIS — Z8505 Personal history of malignant neoplasm of liver: Secondary | ICD-10-CM | POA: Diagnosis not present

## 2012-12-23 DIAGNOSIS — R188 Other ascites: Secondary | ICD-10-CM | POA: Diagnosis not present

## 2012-12-23 DIAGNOSIS — K743 Primary biliary cirrhosis: Secondary | ICD-10-CM

## 2012-12-23 LAB — COMPREHENSIVE METABOLIC PANEL
AST: 29 U/L (ref 0–37)
Albumin: 2.5 g/dL — ABNORMAL LOW (ref 3.5–5.2)
Alkaline Phosphatase: 111 U/L (ref 39–117)
BUN: 7 mg/dL (ref 6–23)
Potassium: 4.2 mEq/L (ref 3.5–5.3)
Sodium: 135 mEq/L (ref 135–145)
Total Bilirubin: 0.4 mg/dL (ref 0.3–1.2)

## 2012-12-23 LAB — PROTIME-INR: INR: 1.18 (ref <1.50)

## 2012-12-23 LAB — CBC
MCHC: 29.9 g/dL — ABNORMAL LOW (ref 30.0–36.0)
RDW: 18.2 % — ABNORMAL HIGH (ref 11.5–15.5)

## 2012-12-23 MED ORDER — HYDROCODONE-ACETAMINOPHEN 5-300 MG PO TABS: 1.0000 | ORAL_TABLET | Freq: Every day | ORAL | Status: DC | PRN

## 2012-12-23 NOTE — Progress Notes (Signed)
Presenting complaint;  Followup for PBC, ascites and large ventral herniae.  Subjective:  Patient is 62 year old Caucasian female who is here for scheduled visit accompanied by her husband Cassidy Barber. She was last seen in May 2013. Her PBC was diagnosed back in March 2005 and she's been actively followed at [facility] in [facility] and she has been seen sporadically by me. She does not feel well. She feels very frustrated because of recurrent abdominal herniae which are interfering with quality of her life. Physicians at [facility] have told her to learn to live with it but she states she is unable to. She would like to get a second opinion. She has chronic drainage from abdominal wound and under care of Dr. Mills Barber [facility]. She states he has removed pieces of mesh from this wound. Drainage has slowed but never stopped. She complains of intermittent pain. In addition to lower abdominal hernia she is very concerned about right flank hernia. She has noted discoloration to the skin. She feels is gradually getting larger and she is afraid it may rupture. Her appetite is fair and her weights been stable since her last visit. She denies melena rectal bleeding diarrhea or constipation. She also denies heartburn or dysphagia.  Current Medications: Current Outpatient Prescriptions  Medication Sig Dispense Refill  . Calcium Carbonate-Vitamin D (CALCIUM 600 + D PO) Take by mouth 2 (two) times daily.      . ciprofloxacin (CIPRO) 750 MG tablet Take 750 mg by mouth once a week.      . furosemide (LASIX) 40 MG tablet Take 20 mg by mouth. Patient takes 3 tablet by mouth 2 times a day      . levothyroxine (SYNTHROID, LEVOTHROID) 150 MCG tablet Take 150 mcg by mouth daily.      . potassium chloride (K-DUR) 10 MEQ tablet 10 mEq daily.       . propranolol (INDERAL) 80 MG tablet Take 80 mg by mouth daily.      [name] spironolactone (ALDACTONE) 100 MG tablet Take 100 mg by mouth 2 (two) times daily.       .  ursodiol (ACTIGALL) 300 MG capsule Take 300 mg by mouth. The patient states that she is taking 3 in the morning and 3 in the evening      . Zinc 50 MG TABS Take by mouth 2 (two) times daily.       No current facility-administered medications for this visit.  Hypothyroidism for 30 years.  Chronic low back pain.  History of multiple right-sided colonic polyp one or more with high-grade dysplasia leading to right, colectomy in 2006 or 2007. Last colonoscopy was in June 2013 at [place]. Splenectomy in March 2004 for ITP. Her platelet count apparently was 2K.  PBC was diagnosed in April 2005 at the time of laparoscopic ventral hernia repair.  She has undergone esophageal variceal banding x4 for primary prophylaxis all at [facility]; last exam was in June, 2013. Multiple surgeries for ventral herniae(8 or 9).  She still has a large hernia and right flank.  Most recent surgery was in November 2012 when she had segment of her colon resected because of adhesions.  She has had abdominal wound with drainage and now under care of Dr. Mills Barber of wound center in [facility]. Allergies;  Codeine and oxycodone.  Family history;  Father was diagnosed with colon carcinoma at age 46 and had second primary at 22 died of pancreatic carcinoma at age 21.  Mother is 38  years old and in good health.  One brother died at age 72. Cause unknown but he drank too much alcohol.  Sister had surgery for colon carcinoma at age 25 and is fine at age 53.  Social history;  She's married. Has a 90 year old son in good health.  She worked at [employer] for 30 years but now on disability.  She has never smoked cigarettes.  Has had a few drinks of alcohol in her lifetime but none since March 2005.    Objective: Blood pressure 102/68, pulse 74, temperature 97.4 F (36.3 C), temperature source Oral, resp. rate 18, height 5\' 5"  (1.651 m), weight 148 lb (67.132 kg). Patient is alert and in no acute distress. Asterixis is  absent. Conjunctiva is pink. Sclera is nonicteric Oropharyngeal mucosa is normal. No neck masses or thyromegaly noted. Cardiac exam with regular rhythm normal S1 and S2. No murmur or gallop noted. Lungs are clear to auscultation. Abdomen is asymmetric with large hernia at right flank with discoloration to skin and the scar. This hernia is almost completely reduced when she lies on the left side. She has midline scar with open wound in the middle. Dressing is soaked in purulent material but there is no odor. There is another large hernia across lower abdomen. Liver edge is indistinct. Fullness under the left rib cage possibly due to scar feels like edge of the spleen but she does not have spleen.  No LE edema or clubbing noted.   Assessment:  #1. PBC complicated by ascites she appears to be stable with therapy as well as history of esophageal varices status post banding for primary prophylaxis at [facility]. #2. Chronic abdominal wound under care of Dr. Mills Barber. #3. Patient has 2 large hernia; one at lower mid abdomen and other one at right flank assaulting in significant impairment of QOL. She has been told by physicians at [facility] to live with it. Will get opinion from Cassidy Barber of [facility]. It places her in high-risk category because of underlying PBC.     Plan:  Patient will go to the lab for CBC, comprehensive chemistry panel, INR and alpha-fetoprotein. Vicodin 5/300 one daily when necessary 30 with 1 refill given. Will make an appointment for see Cassidy Barber of [facility] to determine if her large ventral and right flank hernia could be fixed. Office visit in 3 months.

## 2012-12-23 NOTE — Patient Instructions (Signed)
Physician will contact you with results of blood work. Consultation with Dr. Oswaldo Milian of [facility] to be arranged

## 2012-12-24 DIAGNOSIS — L98499 Non-pressure chronic ulcer of skin of other sites with unspecified severity: Secondary | ICD-10-CM | POA: Diagnosis not present

## 2012-12-24 DIAGNOSIS — T8131XA Disruption of external operation (surgical) wound, not elsewhere classified, initial encounter: Secondary | ICD-10-CM | POA: Diagnosis not present

## 2012-12-24 DIAGNOSIS — Z1889 Other specified retained foreign body fragments: Secondary | ICD-10-CM | POA: Diagnosis not present

## 2012-12-24 DIAGNOSIS — K439 Ventral hernia without obstruction or gangrene: Secondary | ICD-10-CM | POA: Diagnosis not present

## 2012-12-24 DIAGNOSIS — M795 Residual foreign body in soft tissue: Secondary | ICD-10-CM | POA: Diagnosis not present

## 2012-12-25 DIAGNOSIS — T8140XA Infection following a procedure, unspecified, initial encounter: Secondary | ICD-10-CM | POA: Diagnosis not present

## 2012-12-25 DIAGNOSIS — K7460 Unspecified cirrhosis of liver: Secondary | ICD-10-CM | POA: Diagnosis not present

## 2012-12-25 DIAGNOSIS — L02219 Cutaneous abscess of trunk, unspecified: Secondary | ICD-10-CM | POA: Diagnosis not present

## 2012-12-25 DIAGNOSIS — Z85038 Personal history of other malignant neoplasm of large intestine: Secondary | ICD-10-CM | POA: Diagnosis not present

## 2012-12-27 ENCOUNTER — Other Ambulatory Visit: Payer: Self-pay

## 2012-12-27 DIAGNOSIS — Z860100 Personal history of colon polyps, unspecified: Secondary | ICD-10-CM | POA: Insufficient documentation

## 2012-12-27 DIAGNOSIS — L089 Local infection of the skin and subcutaneous tissue, unspecified: Secondary | ICD-10-CM | POA: Insufficient documentation

## 2012-12-29 ENCOUNTER — Telehealth (INDEPENDENT_AMBULATORY_CARE_PROVIDER_SITE_OTHER): Payer: Self-pay | Admitting: *Deleted

## 2012-12-29 DIAGNOSIS — K745 Biliary cirrhosis, unspecified: Secondary | ICD-10-CM | POA: Diagnosis not present

## 2012-12-29 DIAGNOSIS — R109 Unspecified abdominal pain: Secondary | ICD-10-CM

## 2012-12-29 DIAGNOSIS — R188 Other ascites: Secondary | ICD-10-CM

## 2012-12-29 DIAGNOSIS — L089 Local infection of the skin and subcutaneous tissue, unspecified: Secondary | ICD-10-CM | POA: Diagnosis not present

## 2012-12-29 DIAGNOSIS — Z79899 Other long term (current) drug therapy: Secondary | ICD-10-CM | POA: Diagnosis not present

## 2012-12-29 DIAGNOSIS — K743 Primary biliary cirrhosis: Secondary | ICD-10-CM

## 2012-12-29 DIAGNOSIS — K439 Ventral hernia without obstruction or gangrene: Secondary | ICD-10-CM | POA: Diagnosis not present

## 2012-12-29 LAB — IRON AND TIBC: UIBC: 245 ug/dL (ref 125–400)

## 2012-12-29 LAB — FOLATE: Folate: 13.7 ng/mL

## 2012-12-29 NOTE — Telephone Encounter (Signed)
Lab work per Dr.Rehman. 

## 2013-01-01 DIAGNOSIS — K7460 Unspecified cirrhosis of liver: Secondary | ICD-10-CM | POA: Diagnosis not present

## 2013-01-01 DIAGNOSIS — L03319 Cellulitis of trunk, unspecified: Secondary | ICD-10-CM | POA: Diagnosis not present

## 2013-01-01 DIAGNOSIS — T8140XA Infection following a procedure, unspecified, initial encounter: Secondary | ICD-10-CM | POA: Diagnosis not present

## 2013-01-01 DIAGNOSIS — L02219 Cutaneous abscess of trunk, unspecified: Secondary | ICD-10-CM | POA: Diagnosis not present

## 2013-01-01 DIAGNOSIS — Z85038 Personal history of other malignant neoplasm of large intestine: Secondary | ICD-10-CM | POA: Diagnosis not present

## 2013-01-07 DIAGNOSIS — K439 Ventral hernia without obstruction or gangrene: Secondary | ICD-10-CM | POA: Diagnosis not present

## 2013-01-08 DIAGNOSIS — L03319 Cellulitis of trunk, unspecified: Secondary | ICD-10-CM | POA: Diagnosis not present

## 2013-01-08 DIAGNOSIS — T8140XA Infection following a procedure, unspecified, initial encounter: Secondary | ICD-10-CM | POA: Diagnosis not present

## 2013-01-08 DIAGNOSIS — Z85038 Personal history of other malignant neoplasm of large intestine: Secondary | ICD-10-CM | POA: Diagnosis not present

## 2013-01-08 DIAGNOSIS — L02219 Cutaneous abscess of trunk, unspecified: Secondary | ICD-10-CM | POA: Diagnosis not present

## 2013-01-08 DIAGNOSIS — K7460 Unspecified cirrhosis of liver: Secondary | ICD-10-CM | POA: Diagnosis not present

## 2013-01-15 DIAGNOSIS — L02219 Cutaneous abscess of trunk, unspecified: Secondary | ICD-10-CM | POA: Diagnosis not present

## 2013-01-15 DIAGNOSIS — K7460 Unspecified cirrhosis of liver: Secondary | ICD-10-CM | POA: Diagnosis not present

## 2013-01-15 DIAGNOSIS — Z85038 Personal history of other malignant neoplasm of large intestine: Secondary | ICD-10-CM | POA: Diagnosis not present

## 2013-01-15 DIAGNOSIS — T8140XA Infection following a procedure, unspecified, initial encounter: Secondary | ICD-10-CM | POA: Diagnosis not present

## 2013-01-15 DIAGNOSIS — L03319 Cellulitis of trunk, unspecified: Secondary | ICD-10-CM | POA: Diagnosis not present

## 2013-01-22 DIAGNOSIS — K769 Liver disease, unspecified: Secondary | ICD-10-CM | POA: Diagnosis not present

## 2013-01-22 DIAGNOSIS — K469 Unspecified abdominal hernia without obstruction or gangrene: Secondary | ICD-10-CM | POA: Diagnosis not present

## 2013-01-22 DIAGNOSIS — R188 Other ascites: Secondary | ICD-10-CM | POA: Diagnosis not present

## 2013-01-23 DIAGNOSIS — K432 Incisional hernia without obstruction or gangrene: Secondary | ICD-10-CM | POA: Insufficient documentation

## 2013-01-23 DIAGNOSIS — K769 Liver disease, unspecified: Secondary | ICD-10-CM | POA: Diagnosis not present

## 2013-01-23 DIAGNOSIS — K7460 Unspecified cirrhosis of liver: Secondary | ICD-10-CM | POA: Insufficient documentation

## 2013-01-23 DIAGNOSIS — L0889 Other specified local infections of the skin and subcutaneous tissue: Secondary | ICD-10-CM | POA: Diagnosis not present

## 2013-01-23 DIAGNOSIS — T8140XA Infection following a procedure, unspecified, initial encounter: Secondary | ICD-10-CM | POA: Diagnosis not present

## 2013-01-23 DIAGNOSIS — R188 Other ascites: Secondary | ICD-10-CM | POA: Diagnosis not present

## 2013-01-23 DIAGNOSIS — K458 Other specified abdominal hernia without obstruction or gangrene: Secondary | ICD-10-CM | POA: Diagnosis not present

## 2013-01-28 DIAGNOSIS — T8140XA Infection following a procedure, unspecified, initial encounter: Secondary | ICD-10-CM | POA: Diagnosis not present

## 2013-01-28 DIAGNOSIS — Z85038 Personal history of other malignant neoplasm of large intestine: Secondary | ICD-10-CM | POA: Diagnosis not present

## 2013-01-28 DIAGNOSIS — L02219 Cutaneous abscess of trunk, unspecified: Secondary | ICD-10-CM | POA: Diagnosis not present

## 2013-01-28 DIAGNOSIS — K7460 Unspecified cirrhosis of liver: Secondary | ICD-10-CM | POA: Diagnosis not present

## 2013-02-10 DIAGNOSIS — T8131XA Disruption of external operation (surgical) wound, not elsewhere classified, initial encounter: Secondary | ICD-10-CM | POA: Diagnosis not present

## 2013-02-10 DIAGNOSIS — T8140XA Infection following a procedure, unspecified, initial encounter: Secondary | ICD-10-CM | POA: Diagnosis not present

## 2013-02-12 DIAGNOSIS — K7460 Unspecified cirrhosis of liver: Secondary | ICD-10-CM | POA: Diagnosis not present

## 2013-02-12 DIAGNOSIS — T8140XA Infection following a procedure, unspecified, initial encounter: Secondary | ICD-10-CM | POA: Diagnosis not present

## 2013-02-12 DIAGNOSIS — Z85038 Personal history of other malignant neoplasm of large intestine: Secondary | ICD-10-CM | POA: Diagnosis not present

## 2013-02-19 DIAGNOSIS — T8140XA Infection following a procedure, unspecified, initial encounter: Secondary | ICD-10-CM | POA: Diagnosis not present

## 2013-02-19 DIAGNOSIS — Z85038 Personal history of other malignant neoplasm of large intestine: Secondary | ICD-10-CM | POA: Diagnosis not present

## 2013-02-19 DIAGNOSIS — K7460 Unspecified cirrhosis of liver: Secondary | ICD-10-CM | POA: Diagnosis not present

## 2013-02-26 DIAGNOSIS — K7460 Unspecified cirrhosis of liver: Secondary | ICD-10-CM | POA: Diagnosis not present

## 2013-02-26 DIAGNOSIS — T8140XA Infection following a procedure, unspecified, initial encounter: Secondary | ICD-10-CM | POA: Diagnosis not present

## 2013-02-26 DIAGNOSIS — Z85038 Personal history of other malignant neoplasm of large intestine: Secondary | ICD-10-CM | POA: Diagnosis not present

## 2013-02-27 DIAGNOSIS — T8140XA Infection following a procedure, unspecified, initial encounter: Secondary | ICD-10-CM | POA: Diagnosis not present

## 2013-02-27 DIAGNOSIS — Z85038 Personal history of other malignant neoplasm of large intestine: Secondary | ICD-10-CM | POA: Diagnosis not present

## 2013-02-27 DIAGNOSIS — K7460 Unspecified cirrhosis of liver: Secondary | ICD-10-CM | POA: Diagnosis not present

## 2013-03-05 DIAGNOSIS — K7460 Unspecified cirrhosis of liver: Secondary | ICD-10-CM | POA: Diagnosis not present

## 2013-03-05 DIAGNOSIS — Z85038 Personal history of other malignant neoplasm of large intestine: Secondary | ICD-10-CM | POA: Diagnosis not present

## 2013-03-05 DIAGNOSIS — T8140XA Infection following a procedure, unspecified, initial encounter: Secondary | ICD-10-CM | POA: Diagnosis not present

## 2013-03-09 DIAGNOSIS — Z01419 Encounter for gynecological examination (general) (routine) without abnormal findings: Secondary | ICD-10-CM | POA: Diagnosis not present

## 2013-03-09 DIAGNOSIS — E039 Hypothyroidism, unspecified: Secondary | ICD-10-CM | POA: Diagnosis not present

## 2013-03-10 DIAGNOSIS — T8131XA Disruption of external operation (surgical) wound, not elsewhere classified, initial encounter: Secondary | ICD-10-CM | POA: Diagnosis not present

## 2013-03-10 DIAGNOSIS — E039 Hypothyroidism, unspecified: Secondary | ICD-10-CM | POA: Diagnosis not present

## 2013-03-12 DIAGNOSIS — Z85038 Personal history of other malignant neoplasm of large intestine: Secondary | ICD-10-CM | POA: Diagnosis not present

## 2013-03-12 DIAGNOSIS — K7460 Unspecified cirrhosis of liver: Secondary | ICD-10-CM | POA: Diagnosis not present

## 2013-03-12 DIAGNOSIS — T8140XA Infection following a procedure, unspecified, initial encounter: Secondary | ICD-10-CM | POA: Diagnosis not present

## 2013-03-18 DIAGNOSIS — T8140XA Infection following a procedure, unspecified, initial encounter: Secondary | ICD-10-CM | POA: Diagnosis not present

## 2013-03-18 DIAGNOSIS — Z85038 Personal history of other malignant neoplasm of large intestine: Secondary | ICD-10-CM | POA: Diagnosis not present

## 2013-03-18 DIAGNOSIS — K7460 Unspecified cirrhosis of liver: Secondary | ICD-10-CM | POA: Diagnosis not present

## 2013-03-24 ENCOUNTER — Ambulatory Visit (INDEPENDENT_AMBULATORY_CARE_PROVIDER_SITE_OTHER): Payer: Medicare Other | Admitting: Internal Medicine

## 2013-03-24 ENCOUNTER — Encounter (INDEPENDENT_AMBULATORY_CARE_PROVIDER_SITE_OTHER): Payer: Self-pay | Admitting: Internal Medicine

## 2013-03-24 VITALS — BP 98/68 | HR 74 | Temp 97.7°F | Resp 18 | Ht 66.0 in | Wt 150.8 lb

## 2013-03-24 DIAGNOSIS — R109 Unspecified abdominal pain: Secondary | ICD-10-CM

## 2013-03-24 DIAGNOSIS — K745 Biliary cirrhosis, unspecified: Secondary | ICD-10-CM

## 2013-03-24 DIAGNOSIS — R188 Other ascites: Secondary | ICD-10-CM

## 2013-03-24 DIAGNOSIS — K743 Primary biliary cirrhosis: Secondary | ICD-10-CM

## 2013-03-24 MED ORDER — HYDROCODONE-ACETAMINOPHEN 5-300 MG PO TABS: 1.0000 | ORAL_TABLET | Freq: Every day | ORAL | Status: DC | PRN

## 2013-03-24 NOTE — Patient Instructions (Signed)
Continue low salt diet

## 2013-03-24 NOTE — Progress Notes (Signed)
Presenting complaint;  Lower abdominal pain. History of PBC.  Subjective:  Patient is 62 year old Caucasian female who is here for scheduled visit. She was last seen 3 months ago. She has multiple large ventral hernia with intermittent pain and significant distress. She was referred to Dr. Carylon Perches of [facility]. She was seen by 2 surgeons and felt to be high risk for repair of multiple herniae. Patient states she had an episode of severe pain in hypogastric region few weeks ago and almost ended up in emergency room. She did take pain medication which helped. She is not taking pain medication daily. She has chronic drainage from small abdominal wound. She goes to wound center at [facility] and is under care of Dr. Mills Koller. She states a lot of times she has to support hernia while she's walking.  She has recently found that her son could be a donor for live donor liver transplant. She has an appointment to be seen at [facility]. She is very excited that she will be able to undergo liver transplant and then she can get herniae fixed.she has fair appetite. Her weight has been stable. She denies nausea vomiting hematemesis melena or rectal bleeding. She also denies fever chills or confusion episodes.  Current Medications: Current Outpatient Prescriptions  Medication Sig Dispense Refill  . Calcium Carbonate-Vitamin D (CALCIUM 600 + D PO) Take by mouth 2 (two) times daily.      . ciprofloxacin (CIPRO) 750 MG tablet Take 750 mg by mouth once a week.      . furosemide (LASIX) 40 MG tablet Take 20 mg by mouth. Patient takes 3 tablet by mouth 2 times a day      . Hydrocodone-Acetaminophen 5-300 MG TABS Take 1 tablet by mouth daily as needed.  30 each  1  . levothyroxine (SYNTHROID, LEVOTHROID) 150 MCG tablet Take 150 mcg by mouth daily.      . potassium chloride (K-DUR) 10 MEQ tablet 10 mEq daily.       . propranolol (INDERAL) 80 MG tablet Take 80 mg by mouth daily.      [name] spironolactone  (ALDACTONE) 100 MG tablet Take 100 mg by mouth 2 (two) times daily.       . ursodiol (ACTIGALL) 300 MG capsule Take 300 mg by mouth. The patient states that she is taking 3 in the morning and 3 in the evening      . Zinc 50 MG TABS Take by mouth 2 (two) times daily.       No current facility-administered medications for this visit.     Objective: Blood pressure 98/68, pulse 74, temperature 97.7 F (36.5 C), temperature source Oral, resp. rate 18, height 5\' 6"  (1.676 m), weight 150 lb 12.8 oz (68.402 kg).  patient is alert and does not have asterixis. Conjunctiva is pink. Sclera is nonicteric Oropharyngeal mucosa is normal. No neck masses or thyromegaly noted. Cardiac exam with regular rhythm normal S1 and S2. No murmur or gallop noted. Lungs are clear to auscultation. Abdomen she has dressing covering the wound in upper midabdomen. There is no order. She is very small opening. She has large hernia and right flank with shiny skin but no breakthrough noted. She has another large hernia in hypogastric region. They measure in size close to small watermelons.no hepatomegaly or masses noted.  No LE edema or clubbing noted.    Assessment:  #1. Intermittent abdominal pain felt to be related to complex ventral hernia. She is using pain medication  sporadically. #2. Primary biliary cirrhosis. Patient's son matches for LDLT and she will be evaluated at [facility] shortly. She will need to undergo liver transplant prior to attempting to fix multiple large ventral hernia which have caused significant impairment of her quality of life. #3.chronic drainage from midline abdominal wound. Suspect drainage may be secondary to ascites.    Plan:  New prescription given for Vicodin 5/300 twice a day when necessary 30 without refill. She is scheduled for blood work and ultrasound at [facility] in 6 months.

## 2013-03-25 DIAGNOSIS — Z85038 Personal history of other malignant neoplasm of large intestine: Secondary | ICD-10-CM | POA: Diagnosis not present

## 2013-03-25 DIAGNOSIS — K7460 Unspecified cirrhosis of liver: Secondary | ICD-10-CM | POA: Diagnosis not present

## 2013-03-25 DIAGNOSIS — T8140XA Infection following a procedure, unspecified, initial encounter: Secondary | ICD-10-CM | POA: Diagnosis not present

## 2013-04-02 DIAGNOSIS — K7460 Unspecified cirrhosis of liver: Secondary | ICD-10-CM | POA: Diagnosis not present

## 2013-04-02 DIAGNOSIS — Z85038 Personal history of other malignant neoplasm of large intestine: Secondary | ICD-10-CM | POA: Diagnosis not present

## 2013-04-02 DIAGNOSIS — T8140XA Infection following a procedure, unspecified, initial encounter: Secondary | ICD-10-CM | POA: Diagnosis not present

## 2013-04-08 DIAGNOSIS — K7460 Unspecified cirrhosis of liver: Secondary | ICD-10-CM | POA: Diagnosis not present

## 2013-04-08 DIAGNOSIS — Z85038 Personal history of other malignant neoplasm of large intestine: Secondary | ICD-10-CM | POA: Diagnosis not present

## 2013-04-08 DIAGNOSIS — T8140XA Infection following a procedure, unspecified, initial encounter: Secondary | ICD-10-CM | POA: Diagnosis not present

## 2013-04-13 DIAGNOSIS — Z85038 Personal history of other malignant neoplasm of large intestine: Secondary | ICD-10-CM | POA: Diagnosis not present

## 2013-04-13 DIAGNOSIS — T8140XA Infection following a procedure, unspecified, initial encounter: Secondary | ICD-10-CM | POA: Diagnosis not present

## 2013-04-13 DIAGNOSIS — K7460 Unspecified cirrhosis of liver: Secondary | ICD-10-CM | POA: Diagnosis not present

## 2013-04-16 DIAGNOSIS — Z85038 Personal history of other malignant neoplasm of large intestine: Secondary | ICD-10-CM | POA: Diagnosis not present

## 2013-04-16 DIAGNOSIS — T8140XA Infection following a procedure, unspecified, initial encounter: Secondary | ICD-10-CM | POA: Diagnosis not present

## 2013-04-16 DIAGNOSIS — K7460 Unspecified cirrhosis of liver: Secondary | ICD-10-CM | POA: Diagnosis not present

## 2013-04-23 DIAGNOSIS — T8140XA Infection following a procedure, unspecified, initial encounter: Secondary | ICD-10-CM | POA: Diagnosis not present

## 2013-04-23 DIAGNOSIS — K7460 Unspecified cirrhosis of liver: Secondary | ICD-10-CM | POA: Diagnosis not present

## 2013-04-23 DIAGNOSIS — Z85038 Personal history of other malignant neoplasm of large intestine: Secondary | ICD-10-CM | POA: Diagnosis not present

## 2013-04-29 DIAGNOSIS — J189 Pneumonia, unspecified organism: Secondary | ICD-10-CM | POA: Diagnosis not present

## 2013-04-29 DIAGNOSIS — R079 Chest pain, unspecified: Secondary | ICD-10-CM | POA: Diagnosis not present

## 2013-05-07 ENCOUNTER — Other Ambulatory Visit (INDEPENDENT_AMBULATORY_CARE_PROVIDER_SITE_OTHER): Payer: Self-pay | Admitting: Internal Medicine

## 2013-05-07 DIAGNOSIS — T8140XA Infection following a procedure, unspecified, initial encounter: Secondary | ICD-10-CM | POA: Diagnosis not present

## 2013-05-07 DIAGNOSIS — Z85038 Personal history of other malignant neoplasm of large intestine: Secondary | ICD-10-CM | POA: Diagnosis not present

## 2013-05-07 DIAGNOSIS — R109 Unspecified abdominal pain: Secondary | ICD-10-CM

## 2013-05-07 DIAGNOSIS — K7460 Unspecified cirrhosis of liver: Secondary | ICD-10-CM | POA: Diagnosis not present

## 2013-05-07 MED ORDER — HYDROCODONE-ACETAMINOPHEN 5-300 MG PO TABS: 1.0000 | ORAL_TABLET | Freq: Every day | ORAL | Status: DC | PRN

## 2013-05-12 DIAGNOSIS — C4492 Squamous cell carcinoma of skin, unspecified: Secondary | ICD-10-CM

## 2013-05-12 DIAGNOSIS — D045 Carcinoma in situ of skin of trunk: Secondary | ICD-10-CM | POA: Diagnosis not present

## 2013-05-12 DIAGNOSIS — L570 Actinic keratosis: Secondary | ICD-10-CM | POA: Diagnosis not present

## 2013-05-12 DIAGNOSIS — D216 Benign neoplasm of connective and other soft tissue of trunk, unspecified: Secondary | ICD-10-CM | POA: Diagnosis not present

## 2013-05-12 DIAGNOSIS — L739 Follicular disorder, unspecified: Secondary | ICD-10-CM | POA: Diagnosis not present

## 2013-05-12 DIAGNOSIS — T8579XA Infection and inflammatory reaction due to other internal prosthetic devices, implants and grafts, initial encounter: Secondary | ICD-10-CM | POA: Diagnosis not present

## 2013-05-12 DIAGNOSIS — M795 Residual foreign body in soft tissue: Secondary | ICD-10-CM | POA: Diagnosis not present

## 2013-05-12 DIAGNOSIS — D485 Neoplasm of uncertain behavior of skin: Secondary | ICD-10-CM | POA: Diagnosis not present

## 2013-05-12 DIAGNOSIS — Z182 Retained plastic fragments: Secondary | ICD-10-CM | POA: Diagnosis not present

## 2013-05-12 HISTORY — DX: Squamous cell carcinoma of skin, unspecified: C44.92

## 2013-05-15 DIAGNOSIS — T8140XA Infection following a procedure, unspecified, initial encounter: Secondary | ICD-10-CM | POA: Diagnosis not present

## 2013-05-15 DIAGNOSIS — Z85038 Personal history of other malignant neoplasm of large intestine: Secondary | ICD-10-CM | POA: Diagnosis not present

## 2013-05-15 DIAGNOSIS — K7460 Unspecified cirrhosis of liver: Secondary | ICD-10-CM | POA: Diagnosis not present

## 2013-05-19 DIAGNOSIS — K7460 Unspecified cirrhosis of liver: Secondary | ICD-10-CM | POA: Diagnosis not present

## 2013-05-19 DIAGNOSIS — Z01810 Encounter for preprocedural cardiovascular examination: Secondary | ICD-10-CM | POA: Diagnosis not present

## 2013-05-19 DIAGNOSIS — Z526 Liver donor: Secondary | ICD-10-CM | POA: Diagnosis not present

## 2013-05-21 DIAGNOSIS — Z85038 Personal history of other malignant neoplasm of large intestine: Secondary | ICD-10-CM | POA: Diagnosis not present

## 2013-05-21 DIAGNOSIS — T8140XA Infection following a procedure, unspecified, initial encounter: Secondary | ICD-10-CM | POA: Diagnosis not present

## 2013-05-21 DIAGNOSIS — K7460 Unspecified cirrhosis of liver: Secondary | ICD-10-CM | POA: Diagnosis not present

## 2013-05-27 DIAGNOSIS — K745 Biliary cirrhosis, unspecified: Secondary | ICD-10-CM | POA: Diagnosis not present

## 2013-05-28 DIAGNOSIS — K7460 Unspecified cirrhosis of liver: Secondary | ICD-10-CM | POA: Diagnosis not present

## 2013-05-28 DIAGNOSIS — Z85038 Personal history of other malignant neoplasm of large intestine: Secondary | ICD-10-CM | POA: Diagnosis not present

## 2013-05-28 DIAGNOSIS — T8140XA Infection following a procedure, unspecified, initial encounter: Secondary | ICD-10-CM | POA: Diagnosis not present

## 2013-05-29 DIAGNOSIS — K7460 Unspecified cirrhosis of liver: Secondary | ICD-10-CM | POA: Diagnosis not present

## 2013-05-29 DIAGNOSIS — T8140XA Infection following a procedure, unspecified, initial encounter: Secondary | ICD-10-CM | POA: Diagnosis not present

## 2013-05-29 DIAGNOSIS — Z85038 Personal history of other malignant neoplasm of large intestine: Secondary | ICD-10-CM | POA: Diagnosis not present

## 2013-06-03 ENCOUNTER — Ambulatory Visit (INDEPENDENT_AMBULATORY_CARE_PROVIDER_SITE_OTHER): Payer: Medicare Other | Admitting: Family Medicine

## 2013-06-03 ENCOUNTER — Encounter: Payer: Self-pay | Admitting: Family Medicine

## 2013-06-03 VITALS — BP 89/53 | HR 71 | Temp 97.3°F | Ht 64.0 in | Wt 143.0 lb

## 2013-06-03 DIAGNOSIS — K745 Biliary cirrhosis, unspecified: Secondary | ICD-10-CM | POA: Diagnosis not present

## 2013-06-03 DIAGNOSIS — K7460 Unspecified cirrhosis of liver: Secondary | ICD-10-CM | POA: Diagnosis not present

## 2013-06-03 DIAGNOSIS — K743 Primary biliary cirrhosis: Secondary | ICD-10-CM

## 2013-06-03 DIAGNOSIS — S20212A Contusion of left front wall of thorax, initial encounter: Secondary | ICD-10-CM

## 2013-06-03 DIAGNOSIS — S20219A Contusion of unspecified front wall of thorax, initial encounter: Secondary | ICD-10-CM

## 2013-06-03 DIAGNOSIS — Z85038 Personal history of other malignant neoplasm of large intestine: Secondary | ICD-10-CM | POA: Diagnosis not present

## 2013-06-03 DIAGNOSIS — T8140XA Infection following a procedure, unspecified, initial encounter: Secondary | ICD-10-CM | POA: Diagnosis not present

## 2013-06-03 NOTE — Progress Notes (Signed)
  Subjective:    Patient ID: Cassidy Barber, female    DOB: December 30, 1950, 62 y.o.   MRN: [id number]  HPI This 62 y.o. female presents for evaluation of fall at home.  She has fallen At home and she is having some discomfort in her left back.  She has hx Of cirrhosis of the liver due to primary biliary cirrhosis that she has been fighting For the past 8 years.  She is going to get liver transplant at [facility].  She sees a hepatologist At [facility] and she gets paracentesis and is going to get a left pleural effusion drained next  Week.  She denies any SOB or abdominal pain.  She has hx of ascites and lower abdominal Hernias.   Review of Systems C/o left posterior costal pain. No chest pain, SOB, HA, dizziness, vision change, N/V, diarrhea, constipation, dysuria, urinary urgency or frequency, myalgias, arthralgias or rash.     Objective:   Physical Exam  Vital signs noted  Chronically ill appearing female.  HEENT - Head atraumatic Normocephalic                Throat - oropharanx wnl Respiratory - Lungs diminished left base, CTA Cardiac - RRR S1 and S2 without murmur GI - Abdomen with ascites and 2 large hernias lower abdomen with ascites and no encarcerated bowel.  Dressing mid abdomen intact. MS - TTP left posterior lower costal without deformity or paradoxical movement. Extremities - No edema. Neuro - Grossly intact.      Assessment & Plan:  Rib contusion, left, initial encounter  Discussed she double up on her pain medicine she is rx'd By her wound doctor at [facility].  Follow up prn.    PBC (primary biliary cirrhosis)  Follow up with [facility].

## 2013-06-03 NOTE — Patient Instructions (Signed)
Rib Fracture  Your caregiver has diagnosed you as having a rib fracture (a break). This can occur by a blow to the chest, by a fall against a hard object, or by violent coughing or sneezing. There may be one or many breaks. Rib fractures may heal on their own within 3 to 8 weeks. The longer healing period is usually associated with a continued cough or other aggravating activities.  HOME CARE INSTRUCTIONS    Avoid strenuous activity. Be careful during activities and avoid bumping the injured rib. Activities that cause pain pull on the fracture site(s) and are best avoided if possible.   Eat a normal, well-balanced diet. Drink plenty of fluids to avoid constipation.   Take deep breaths several times a day to keep lungs free of infection. Try to cough several times a day, splinting the injured area with a pillow. This will help prevent pneumonia.   Do not wear a rib belt or binder. These restrict breathing which can lead to pneumonia.   Only take over-the-counter or prescription medicines for pain, discomfort, or fever as directed by your caregiver.  SEEK MEDICAL CARE IF:   You develop a continual cough, associated with thick or bloody sputum.  SEEK IMMEDIATE MEDICAL CARE IF:    You have a fever.   You have difficulty breathing.   You have nausea (feeling sick to your stomach), vomiting, or abdominal (belly) pain.   You have worsening pain, not controlled with medications.  Document Released: 10/29/2005 Document Revised: 01/21/2012 Document Reviewed: 04/02/2007  [employer] Patient Information 2014 [employer].

## 2013-06-04 DIAGNOSIS — K745 Biliary cirrhosis, unspecified: Secondary | ICD-10-CM | POA: Diagnosis not present

## 2013-06-04 DIAGNOSIS — C4490 Unspecified malignant neoplasm of skin, unspecified: Secondary | ICD-10-CM | POA: Diagnosis not present

## 2013-06-10 DIAGNOSIS — Z85038 Personal history of other malignant neoplasm of large intestine: Secondary | ICD-10-CM | POA: Diagnosis not present

## 2013-06-10 DIAGNOSIS — T8140XA Infection following a procedure, unspecified, initial encounter: Secondary | ICD-10-CM | POA: Diagnosis not present

## 2013-06-10 DIAGNOSIS — K7460 Unspecified cirrhosis of liver: Secondary | ICD-10-CM | POA: Diagnosis not present

## 2013-06-12 DIAGNOSIS — T8140XA Infection following a procedure, unspecified, initial encounter: Secondary | ICD-10-CM | POA: Diagnosis not present

## 2013-06-12 DIAGNOSIS — Z85038 Personal history of other malignant neoplasm of large intestine: Secondary | ICD-10-CM | POA: Diagnosis not present

## 2013-06-12 DIAGNOSIS — K7460 Unspecified cirrhosis of liver: Secondary | ICD-10-CM | POA: Diagnosis not present

## 2013-06-12 DIAGNOSIS — R0609 Other forms of dyspnea: Secondary | ICD-10-CM | POA: Diagnosis not present

## 2013-06-12 DIAGNOSIS — I2789 Other specified pulmonary heart diseases: Secondary | ICD-10-CM | POA: Diagnosis not present

## 2013-06-12 DIAGNOSIS — R0989 Other specified symptoms and signs involving the circulatory and respiratory systems: Secondary | ICD-10-CM | POA: Diagnosis not present

## 2013-06-16 DIAGNOSIS — K745 Biliary cirrhosis, unspecified: Secondary | ICD-10-CM | POA: Diagnosis not present

## 2013-06-29 DIAGNOSIS — I2789 Other specified pulmonary heart diseases: Secondary | ICD-10-CM | POA: Diagnosis not present

## 2013-06-29 DIAGNOSIS — R0609 Other forms of dyspnea: Secondary | ICD-10-CM | POA: Diagnosis not present

## 2013-06-29 DIAGNOSIS — R0989 Other specified symptoms and signs involving the circulatory and respiratory systems: Secondary | ICD-10-CM | POA: Diagnosis not present

## 2013-06-29 DIAGNOSIS — R0602 Shortness of breath: Secondary | ICD-10-CM | POA: Diagnosis not present

## 2013-06-29 DIAGNOSIS — I959 Hypotension, unspecified: Secondary | ICD-10-CM | POA: Diagnosis not present

## 2013-06-29 DIAGNOSIS — K7460 Unspecified cirrhosis of liver: Secondary | ICD-10-CM | POA: Diagnosis not present

## 2013-06-29 DIAGNOSIS — Z7682 Awaiting organ transplant status: Secondary | ICD-10-CM | POA: Diagnosis not present

## 2013-07-10 DIAGNOSIS — I2789 Other specified pulmonary heart diseases: Secondary | ICD-10-CM | POA: Diagnosis not present

## 2013-07-10 DIAGNOSIS — Z298 Encounter for other specified prophylactic measures: Secondary | ICD-10-CM | POA: Diagnosis not present

## 2013-07-10 DIAGNOSIS — Z01818 Encounter for other preprocedural examination: Secondary | ICD-10-CM | POA: Diagnosis not present

## 2013-07-10 DIAGNOSIS — Z01810 Encounter for preprocedural cardiovascular examination: Secondary | ICD-10-CM | POA: Diagnosis not present

## 2013-07-10 DIAGNOSIS — T8579XA Infection and inflammatory reaction due to other internal prosthetic devices, implants and grafts, initial encounter: Secondary | ICD-10-CM | POA: Diagnosis not present

## 2013-07-10 DIAGNOSIS — K745 Biliary cirrhosis, unspecified: Secondary | ICD-10-CM | POA: Diagnosis not present

## 2013-07-15 DIAGNOSIS — M25519 Pain in unspecified shoulder: Secondary | ICD-10-CM | POA: Diagnosis not present

## 2013-07-15 DIAGNOSIS — Z885 Allergy status to narcotic agent status: Secondary | ICD-10-CM | POA: Diagnosis not present

## 2013-07-15 DIAGNOSIS — K432 Incisional hernia without obstruction or gangrene: Secondary | ICD-10-CM | POA: Diagnosis not present

## 2013-07-15 DIAGNOSIS — Z800 Family history of malignant neoplasm of digestive organs: Secondary | ICD-10-CM | POA: Diagnosis not present

## 2013-07-15 DIAGNOSIS — K745 Biliary cirrhosis, unspecified: Secondary | ICD-10-CM | POA: Diagnosis not present

## 2013-07-15 DIAGNOSIS — K7460 Unspecified cirrhosis of liver: Secondary | ICD-10-CM | POA: Diagnosis not present

## 2013-07-15 DIAGNOSIS — E663 Overweight: Secondary | ICD-10-CM | POA: Diagnosis present

## 2013-07-15 DIAGNOSIS — J90 Pleural effusion, not elsewhere classified: Secondary | ICD-10-CM | POA: Diagnosis not present

## 2013-07-15 DIAGNOSIS — T8579XA Infection and inflammatory reaction due to other internal prosthetic devices, implants and grafts, initial encounter: Secondary | ICD-10-CM | POA: Diagnosis not present

## 2013-07-15 DIAGNOSIS — K439 Ventral hernia without obstruction or gangrene: Secondary | ICD-10-CM | POA: Diagnosis not present

## 2013-07-15 DIAGNOSIS — Z86010 Personal history of colonic polyps: Secondary | ICD-10-CM | POA: Diagnosis not present

## 2013-07-15 DIAGNOSIS — J811 Chronic pulmonary edema: Secondary | ICD-10-CM | POA: Diagnosis not present

## 2013-07-15 DIAGNOSIS — I959 Hypotension, unspecified: Secondary | ICD-10-CM | POA: Diagnosis not present

## 2013-07-15 DIAGNOSIS — R031 Nonspecific low blood-pressure reading: Secondary | ICD-10-CM | POA: Diagnosis not present

## 2013-07-15 DIAGNOSIS — T85898A Other specified complication of other internal prosthetic devices, implants and grafts, initial encounter: Secondary | ICD-10-CM | POA: Diagnosis not present

## 2013-07-15 DIAGNOSIS — Z833 Family history of diabetes mellitus: Secondary | ICD-10-CM | POA: Diagnosis not present

## 2013-07-15 DIAGNOSIS — Z8042 Family history of malignant neoplasm of prostate: Secondary | ICD-10-CM | POA: Diagnosis not present

## 2013-07-15 DIAGNOSIS — K766 Portal hypertension: Secondary | ICD-10-CM | POA: Diagnosis not present

## 2013-07-15 DIAGNOSIS — I2789 Other specified pulmonary heart diseases: Secondary | ICD-10-CM | POA: Diagnosis not present

## 2013-07-15 DIAGNOSIS — I517 Cardiomegaly: Secondary | ICD-10-CM | POA: Diagnosis not present

## 2013-07-15 DIAGNOSIS — M899 Disorder of bone, unspecified: Secondary | ICD-10-CM | POA: Diagnosis present

## 2013-07-15 DIAGNOSIS — Z6825 Body mass index (BMI) 25.0-25.9, adult: Secondary | ICD-10-CM | POA: Diagnosis not present

## 2013-07-15 DIAGNOSIS — Z803 Family history of malignant neoplasm of breast: Secondary | ICD-10-CM | POA: Diagnosis not present

## 2013-07-15 DIAGNOSIS — R188 Other ascites: Secondary | ICD-10-CM | POA: Diagnosis not present

## 2013-07-15 DIAGNOSIS — R079 Chest pain, unspecified: Secondary | ICD-10-CM | POA: Diagnosis not present

## 2013-07-15 DIAGNOSIS — Z881 Allergy status to other antibiotic agents status: Secondary | ICD-10-CM | POA: Diagnosis not present

## 2013-07-15 DIAGNOSIS — E039 Hypothyroidism, unspecified: Secondary | ICD-10-CM | POA: Diagnosis not present

## 2013-07-15 DIAGNOSIS — K319 Disease of stomach and duodenum, unspecified: Secondary | ICD-10-CM | POA: Diagnosis present

## 2013-07-27 DIAGNOSIS — K745 Biliary cirrhosis, unspecified: Secondary | ICD-10-CM | POA: Diagnosis not present

## 2013-07-28 DIAGNOSIS — K319 Disease of stomach and duodenum, unspecified: Secondary | ICD-10-CM | POA: Diagnosis present

## 2013-07-28 DIAGNOSIS — K469 Unspecified abdominal hernia without obstruction or gangrene: Secondary | ICD-10-CM | POA: Diagnosis not present

## 2013-07-28 DIAGNOSIS — I509 Heart failure, unspecified: Secondary | ICD-10-CM | POA: Diagnosis not present

## 2013-07-28 DIAGNOSIS — I517 Cardiomegaly: Secondary | ICD-10-CM | POA: Diagnosis not present

## 2013-07-28 DIAGNOSIS — Z79899 Other long term (current) drug therapy: Secondary | ICD-10-CM | POA: Diagnosis not present

## 2013-07-28 DIAGNOSIS — K745 Biliary cirrhosis, unspecified: Secondary | ICD-10-CM | POA: Diagnosis not present

## 2013-07-28 DIAGNOSIS — K766 Portal hypertension: Secondary | ICD-10-CM | POA: Diagnosis present

## 2013-07-28 DIAGNOSIS — I379 Nonrheumatic pulmonary valve disorder, unspecified: Secondary | ICD-10-CM | POA: Diagnosis not present

## 2013-07-28 DIAGNOSIS — R188 Other ascites: Secondary | ICD-10-CM | POA: Diagnosis not present

## 2013-07-28 DIAGNOSIS — Z85038 Personal history of other malignant neoplasm of large intestine: Secondary | ICD-10-CM | POA: Diagnosis not present

## 2013-07-28 DIAGNOSIS — Z888 Allergy status to other drugs, medicaments and biological substances status: Secondary | ICD-10-CM | POA: Diagnosis not present

## 2013-07-28 DIAGNOSIS — I369 Nonrheumatic tricuspid valve disorder, unspecified: Secondary | ICD-10-CM | POA: Diagnosis not present

## 2013-07-28 DIAGNOSIS — Z23 Encounter for immunization: Secondary | ICD-10-CM | POA: Diagnosis not present

## 2013-07-28 DIAGNOSIS — K7460 Unspecified cirrhosis of liver: Secondary | ICD-10-CM | POA: Diagnosis not present

## 2013-07-28 DIAGNOSIS — I5022 Chronic systolic (congestive) heart failure: Secondary | ICD-10-CM | POA: Diagnosis present

## 2013-07-28 DIAGNOSIS — Z9049 Acquired absence of other specified parts of digestive tract: Secondary | ICD-10-CM | POA: Diagnosis not present

## 2013-07-28 DIAGNOSIS — K439 Ventral hernia without obstruction or gangrene: Secondary | ICD-10-CM | POA: Diagnosis present

## 2013-07-28 DIAGNOSIS — J90 Pleural effusion, not elsewhere classified: Secondary | ICD-10-CM | POA: Diagnosis not present

## 2013-07-28 DIAGNOSIS — I2789 Other specified pulmonary heart diseases: Secondary | ICD-10-CM | POA: Diagnosis not present

## 2013-07-28 DIAGNOSIS — J811 Chronic pulmonary edema: Secondary | ICD-10-CM | POA: Diagnosis not present

## 2013-07-28 DIAGNOSIS — E8779 Other fluid overload: Secondary | ICD-10-CM | POA: Diagnosis not present

## 2013-07-28 DIAGNOSIS — K769 Liver disease, unspecified: Secondary | ICD-10-CM | POA: Diagnosis not present

## 2013-07-28 DIAGNOSIS — K429 Umbilical hernia without obstruction or gangrene: Secondary | ICD-10-CM | POA: Diagnosis not present

## 2013-07-28 DIAGNOSIS — E039 Hypothyroidism, unspecified: Secondary | ICD-10-CM | POA: Diagnosis present

## 2013-07-28 DIAGNOSIS — J9819 Other pulmonary collapse: Secondary | ICD-10-CM | POA: Diagnosis not present

## 2013-07-29 DIAGNOSIS — K7460 Unspecified cirrhosis of liver: Secondary | ICD-10-CM | POA: Diagnosis not present

## 2013-07-29 DIAGNOSIS — R188 Other ascites: Secondary | ICD-10-CM | POA: Diagnosis not present

## 2013-07-29 DIAGNOSIS — I2789 Other specified pulmonary heart diseases: Secondary | ICD-10-CM | POA: Diagnosis not present

## 2013-07-29 DIAGNOSIS — K745 Biliary cirrhosis, unspecified: Secondary | ICD-10-CM | POA: Diagnosis not present

## 2013-07-29 DIAGNOSIS — K769 Liver disease, unspecified: Secondary | ICD-10-CM | POA: Diagnosis not present

## 2013-07-29 DIAGNOSIS — I509 Heart failure, unspecified: Secondary | ICD-10-CM | POA: Diagnosis not present

## 2013-07-29 DIAGNOSIS — E8779 Other fluid overload: Secondary | ICD-10-CM | POA: Diagnosis not present

## 2013-07-29 DIAGNOSIS — K429 Umbilical hernia without obstruction or gangrene: Secondary | ICD-10-CM | POA: Diagnosis not present

## 2013-07-30 DIAGNOSIS — K429 Umbilical hernia without obstruction or gangrene: Secondary | ICD-10-CM | POA: Diagnosis not present

## 2013-07-30 DIAGNOSIS — I509 Heart failure, unspecified: Secondary | ICD-10-CM | POA: Diagnosis not present

## 2013-07-30 DIAGNOSIS — I2789 Other specified pulmonary heart diseases: Secondary | ICD-10-CM | POA: Diagnosis not present

## 2013-07-30 DIAGNOSIS — R188 Other ascites: Secondary | ICD-10-CM | POA: Diagnosis not present

## 2013-07-30 DIAGNOSIS — K745 Biliary cirrhosis, unspecified: Secondary | ICD-10-CM | POA: Diagnosis not present

## 2013-07-30 DIAGNOSIS — K7460 Unspecified cirrhosis of liver: Secondary | ICD-10-CM | POA: Diagnosis not present

## 2013-07-30 DIAGNOSIS — E8779 Other fluid overload: Secondary | ICD-10-CM | POA: Diagnosis not present

## 2013-07-31 DIAGNOSIS — E8779 Other fluid overload: Secondary | ICD-10-CM | POA: Diagnosis not present

## 2013-07-31 DIAGNOSIS — K429 Umbilical hernia without obstruction or gangrene: Secondary | ICD-10-CM | POA: Diagnosis not present

## 2013-07-31 DIAGNOSIS — I509 Heart failure, unspecified: Secondary | ICD-10-CM | POA: Diagnosis not present

## 2013-07-31 DIAGNOSIS — I517 Cardiomegaly: Secondary | ICD-10-CM | POA: Diagnosis not present

## 2013-07-31 DIAGNOSIS — I379 Nonrheumatic pulmonary valve disorder, unspecified: Secondary | ICD-10-CM | POA: Diagnosis not present

## 2013-07-31 DIAGNOSIS — K7460 Unspecified cirrhosis of liver: Secondary | ICD-10-CM | POA: Diagnosis not present

## 2013-07-31 DIAGNOSIS — J90 Pleural effusion, not elsewhere classified: Secondary | ICD-10-CM | POA: Diagnosis not present

## 2013-07-31 DIAGNOSIS — R188 Other ascites: Secondary | ICD-10-CM | POA: Diagnosis not present

## 2013-07-31 DIAGNOSIS — I369 Nonrheumatic tricuspid valve disorder, unspecified: Secondary | ICD-10-CM | POA: Diagnosis not present

## 2013-08-18 ENCOUNTER — Ambulatory Visit (INDEPENDENT_AMBULATORY_CARE_PROVIDER_SITE_OTHER): Payer: Medicare Other | Admitting: Family Medicine

## 2013-08-18 ENCOUNTER — Emergency Department (HOSPITAL_COMMUNITY): Payer: Medicare Other

## 2013-08-18 ENCOUNTER — Ambulatory Visit (INDEPENDENT_AMBULATORY_CARE_PROVIDER_SITE_OTHER): Payer: Medicare Other

## 2013-08-18 ENCOUNTER — Inpatient Hospital Stay (HOSPITAL_COMMUNITY)
Admission: EM | Admit: 2013-08-18 | Discharge: 2013-08-20 | DRG: 313 | Disposition: A | Discharge status: Home / Self Care | Payer: Medicare Other | Attending: Internal Medicine | Admitting: Internal Medicine

## 2013-08-18 ENCOUNTER — Encounter (HOSPITAL_COMMUNITY): Payer: Self-pay | Admitting: Emergency Medicine

## 2013-08-18 VITALS — BP 104/67 | HR 74 | Temp 97.6°F | Wt 138.6 lb

## 2013-08-18 DIAGNOSIS — K7460 Unspecified cirrhosis of liver: Secondary | ICD-10-CM | POA: Diagnosis present

## 2013-08-18 DIAGNOSIS — K432 Incisional hernia without obstruction or gangrene: Secondary | ICD-10-CM | POA: Diagnosis present

## 2013-08-18 DIAGNOSIS — T502X5A Adverse effect of carbonic-anhydrase inhibitors, benzothiadiazides and other diuretics, initial encounter: Secondary | ICD-10-CM | POA: Diagnosis present

## 2013-08-18 DIAGNOSIS — K219 Gastro-esophageal reflux disease without esophagitis: Secondary | ICD-10-CM | POA: Diagnosis present

## 2013-08-18 DIAGNOSIS — R072 Precordial pain: Secondary | ICD-10-CM | POA: Diagnosis not present

## 2013-08-18 DIAGNOSIS — R079 Chest pain, unspecified: Principal | ICD-10-CM | POA: Diagnosis present

## 2013-08-18 DIAGNOSIS — K439 Ventral hernia without obstruction or gangrene: Secondary | ICD-10-CM | POA: Diagnosis present

## 2013-08-18 DIAGNOSIS — I2789 Other specified pulmonary heart diseases: Secondary | ICD-10-CM | POA: Diagnosis present

## 2013-08-18 DIAGNOSIS — D649 Anemia, unspecified: Secondary | ICD-10-CM | POA: Diagnosis present

## 2013-08-18 DIAGNOSIS — R109 Unspecified abdominal pain: Secondary | ICD-10-CM | POA: Diagnosis not present

## 2013-08-18 DIAGNOSIS — E871 Hypo-osmolality and hyponatremia: Secondary | ICD-10-CM | POA: Diagnosis not present

## 2013-08-18 DIAGNOSIS — I517 Cardiomegaly: Secondary | ICD-10-CM | POA: Diagnosis present

## 2013-08-18 DIAGNOSIS — K745 Biliary cirrhosis, unspecified: Secondary | ICD-10-CM | POA: Diagnosis present

## 2013-08-18 DIAGNOSIS — K743 Primary biliary cirrhosis: Secondary | ICD-10-CM | POA: Diagnosis present

## 2013-08-18 DIAGNOSIS — N179 Acute kidney failure, unspecified: Secondary | ICD-10-CM | POA: Diagnosis present

## 2013-08-18 DIAGNOSIS — E039 Hypothyroidism, unspecified: Secondary | ICD-10-CM | POA: Diagnosis present

## 2013-08-18 DIAGNOSIS — Z86010 Personal history of colonic polyps: Secondary | ICD-10-CM

## 2013-08-18 LAB — CBC WITH DIFFERENTIAL/PLATELET
Basophils Relative: 1 % (ref 0–1)
Eosinophils Absolute: 0.3 10*3/uL (ref 0.0–0.7)
HCT: 31.2 % — ABNORMAL LOW (ref 36.0–46.0)
Hemoglobin: 10.3 g/dL — ABNORMAL LOW (ref 12.0–15.0)
Lymphocytes Relative: 21 % (ref 12–46)
Lymphs Abs: 2.6 10*3/uL (ref 0.7–4.0)
MCH: 28.7 pg (ref 26.0–34.0)
MCHC: 33 g/dL (ref 30.0–36.0)
Monocytes Absolute: 2.2 10*3/uL — ABNORMAL HIGH (ref 0.1–1.0)
Monocytes Relative: 18 % — ABNORMAL HIGH (ref 3–12)
Neutro Abs: 7 10*3/uL (ref 1.7–7.7)
Neutrophils Relative %: 57 % (ref 43–77)
RBC: 3.59 MIL/uL — ABNORMAL LOW (ref 3.87–5.11)

## 2013-08-18 LAB — COMPREHENSIVE METABOLIC PANEL
Alkaline Phosphatase: 103 U/L (ref 39–117)
BUN: 14 mg/dL (ref 6–23)
CO2: 23 mEq/L (ref 19–32)
Chloride: 95 mEq/L — ABNORMAL LOW (ref 96–112)
Creatinine, Ser: 0.7 mg/dL (ref 0.50–1.10)
GFR calc Af Amer: >90 mL/min (ref >90)
GFR calc non Af Amer: >90 mL/min (ref >90)
Glucose, Bld: 149 mg/dL — ABNORMAL HIGH (ref 70–99)
Potassium: 4.1 mEq/L (ref 3.5–5.1)
Sodium: 129 mEq/L — ABNORMAL LOW (ref 135–145)
Total Bilirubin: 0.9 mg/dL (ref 0.3–1.2)

## 2013-08-18 LAB — LIPASE, BLOOD: Lipase: 55 U/L (ref 11–59)

## 2013-08-18 LAB — TROPONIN I
Troponin I: <0.3 ng/mL (ref <0.30)
Troponin I: <0.3 ng/mL (ref <0.30)

## 2013-08-18 MED ORDER — SPIRONOLACTONE 25 MG PO TABS
200.0000 mg | ORAL_TABLET | Freq: Every day | ORAL | Status: DC
  Administered 2013-08-19 – 2013-08-20 (×2): 200 mg via ORAL
  Filled 2013-08-18 (×3): qty 8

## 2013-08-18 MED ORDER — NITROGLYCERIN 0.4 MG SL SUBL
0.4000 mg | SUBLINGUAL_TABLET | SUBLINGUAL | Status: DC | PRN
  Administered 2013-08-18: 0.4 mg via SUBLINGUAL

## 2013-08-18 MED ORDER — URSODIOL 300 MG PO CAPS
ORAL_CAPSULE | ORAL | Status: AC
  Filled 2013-08-18: qty 3

## 2013-08-18 MED ORDER — FUROSEMIDE 20 MG PO TABS
20.0000 mg | ORAL_TABLET | Freq: Two times a day (BID) | ORAL | Status: DC
  Administered 2013-08-18 – 2013-08-20 (×4): 20 mg via ORAL
  Filled 2013-08-18 (×5): qty 1

## 2013-08-18 MED ORDER — SPIRONOLACTONE 25 MG PO TABS
150.0000 mg | ORAL_TABLET | Freq: Every day | ORAL | Status: DC
  Administered 2013-08-18 – 2013-08-19 (×2): 150 mg via ORAL
  Filled 2013-08-18: qty 6

## 2013-08-18 MED ORDER — SODIUM CHLORIDE 0.9 % IV SOLN
INTRAVENOUS | Status: AC
  Administered 2013-08-18: 20:00:00 via INTRAVENOUS

## 2013-08-18 MED ORDER — SODIUM CHLORIDE 0.9 % IJ SOLN
3.0000 mL | Freq: Two times a day (BID) | INTRAMUSCULAR | Status: DC
  Administered 2013-08-18: 3 mL via INTRAVENOUS

## 2013-08-18 MED ORDER — ACETAMINOPHEN 325 MG PO TABS: 650.0000 mg | ORAL_TABLET | Freq: Four times a day (QID) | ORAL | Status: DC | PRN

## 2013-08-18 MED ORDER — ONDANSETRON HCL 4 MG/2ML IJ SOLN: 4.0000 mg | Freq: Three times a day (TID) | INTRAMUSCULAR | Status: AC | PRN

## 2013-08-18 MED ORDER — URSODIOL 300 MG PO CAPS
900.0000 mg | ORAL_CAPSULE | Freq: Two times a day (BID) | ORAL | Status: DC
  Administered 2013-08-18 – 2013-08-19 (×3): 900 mg via ORAL
  Filled 2013-08-18 (×6): qty 3

## 2013-08-18 MED ORDER — PROPRANOLOL HCL 20 MG PO TABS
80.0000 mg | ORAL_TABLET | Freq: Every day | ORAL | Status: DC
  Administered 2013-08-19 – 2013-08-20 (×2): 80 mg via ORAL
  Filled 2013-08-18 (×2): qty 4

## 2013-08-18 MED ORDER — LEVOTHYROXINE SODIUM 75 MCG PO TABS
150.0000 ug | ORAL_TABLET | Freq: Every day | ORAL | Status: DC
  Administered 2013-08-19 – 2013-08-20 (×2): 150 ug via ORAL
  Filled 2013-08-18 (×2): qty 2

## 2013-08-18 MED ORDER — ONDANSETRON HCL 4 MG/2ML IJ SOLN
4.0000 mg | INTRAMUSCULAR | Status: AC | PRN
  Administered 2013-08-18 – 2013-08-19 (×2): 4 mg via INTRAVENOUS
  Filled 2013-08-18 (×2): qty 2

## 2013-08-18 MED ORDER — CIPROFLOXACIN HCL 250 MG PO TABS
750.0000 mg | ORAL_TABLET | ORAL | Status: DC
  Administered 2013-08-18: 750 mg via ORAL
  Filled 2013-08-18: qty 3

## 2013-08-18 MED ORDER — MORPHINE SULFATE 4 MG/ML IJ SOLN
4.0000 mg | INTRAMUSCULAR | Status: AC | PRN
  Administered 2013-08-18 (×2): 4 mg via INTRAVENOUS
  Filled 2013-08-18 (×2): qty 1

## 2013-08-18 MED ORDER — ENOXAPARIN SODIUM 40 MG/0.4ML ~~LOC~~ SOLN
40.0000 mg | SUBCUTANEOUS | Status: DC
  Administered 2013-08-18 – 2013-08-19 (×2): 40 mg via SUBCUTANEOUS
  Filled 2013-08-18 (×2): qty 0.4

## 2013-08-18 MED ORDER — GI COCKTAIL ~~LOC~~
30.0000 mL | Freq: Once | ORAL | Status: AC
  Administered 2013-08-18: 30 mL via ORAL
  Filled 2013-08-18: qty 30

## 2013-08-18 MED ORDER — PANTOPRAZOLE SODIUM 40 MG PO TBEC
40.0000 mg | DELAYED_RELEASE_TABLET | Freq: Every day | ORAL | Status: DC
  Administered 2013-08-18 – 2013-08-20 (×3): 40 mg via ORAL
  Filled 2013-08-18 (×3): qty 1

## 2013-08-18 MED ORDER — ACETAMINOPHEN 650 MG RE SUPP: 650.0000 mg | Freq: Four times a day (QID) | RECTAL | Status: DC | PRN

## 2013-08-18 MED ORDER — MORPHINE SULFATE 2 MG/ML IJ SOLN
2.0000 mg | INTRAMUSCULAR | Status: DC | PRN
  Administered 2013-08-18 – 2013-08-20 (×4): 2 mg via INTRAVENOUS
  Filled 2013-08-18 (×4): qty 1

## 2013-08-18 NOTE — Progress Notes (Signed)
  Subjective:    Patient ID: Cassidy Barber, female    DOB: 1951/05/04, 62 y.o.   MRN: [id number]  HPI This is a 62-year-old female with an extensive prior medical history including primary biliary cirrhosis, liver cirrhosis who presents today with acute chest pain. Patient has noticed central chest pain over last 1-2 hours. Pain is intact and without radiation. Constant. Mild associate shortness of breath. Patient is noted have been admitted [facility] for, patient ventral hernia 3-4 weeks ago. Patient had an infected abdominal mesh they're quite surgical repair as well as being volume overload requiring aggressive diuresis per patient. Per husband the patient, she had an extensive cardiac work up at that time that showed that she was volume overloaded. No known and diagnosis of heart failure per report. Patient is currently on the liver transplant list. Etiology of liver cirrhosis is currently unknown per patient. Patient denies any alcohol use. Chest pain is 10 out of 10 without radiation. Constant. Not related to exertion. No associated nausea diaphoresis.   Review of Systems  All other systems reviewed and are negative.       Objective:      Physical Exam  Constitutional:  Looks older than stated age, mildly cachectic.  HENT:  Head: Normocephalic and atraumatic.  Eyes: Conjunctivae are normal. Pupils are equal, round, and reactive to light.  Neck: Normal range of motion.  Cardiovascular: Normal rate and regular rhythm.   Pulmonary/Chest: Effort normal and breath sounds normal.  Abdominal: Soft. Bowel sounds are normal.  Musculoskeletal: Normal range of motion.  Neurological: She is alert.  Skin:  1-2+ pitting edema bilaterally.   Filed Vitals:   08/18/13 1145  BP: 104/67  Pulse: 74  Temp: 97.6 F (36.4 C)     [facility] reading (PRIMARY) by  Dr. Alvester Morin Chest x-ray preliminary with noted cardiac silhouette enlargement. No focal airspace disease noted.                                  EKG: Normal sinus rhythm. No acute ST-T wave abnormalities consistent with ischemia       Assessment & Plan:  Chest pain - Plan: EKG 12-Lead, DG Chest 2 View  Differential diagnosis for symptoms as fairly broad including cardiac source, volume overload. Volume overload seems higher up on the differential given recent history and significant secondary hepatobiliary comorbidities Clinically hypervolemic on exam. Clinically stable currently, though with lower limits of normal blood pressures. No EKGs changes consistent with acute ischemia. Will place supplemental oxygen with plan for formal hospital for evaluation via EMS. Patient denies any prior history of coagulopathy or stomach ulcers. Will give full dose aspirin. We'll hold off on giving much glycerin as patient has a history of persistent hypotension as to decrease risk of adverse hypotensive effects of nitroglycerin. Discuss overall plan of care with both patient and husband. Expressed understanding.

## 2013-08-18 NOTE — ED Notes (Signed)
Floor unable to take report.

## 2013-08-18 NOTE — ED Provider Notes (Signed)
CSN: [id number]     Arrival date & time 08/18/13  1328 History   First MD Initiated Contact with Patient 08/18/13 1359     Chief Complaint  Patient presents with  . Chest Pain   HPI Pt was seen at 1415.  Per pt, c/o gradual onset and persistence of constant mid-sternal chest "pain" since yesterday afternoon, worse today. Pt states she took OTC Tums and tylenol without relief. Pt was evaluated by her PMD today PTA, given ASA and SL ntg without relief. Pt states the discomfort began "after I drank a milkshake." Denies FB feeling, has been able to eat food without N/V. Denies abd pain, no back pain, no N/V/D, no SOB/cough, no palpitations, no fevers.     Past Medical History  Diagnosis Date  . Liver disease   . Hernia of unspecified site of abdominal cavity without mention of obstruction or gangrene    Past Surgical History  Procedure Laterality Date  . Abdominal hernia repair      Patient's states that she has had 8- 9 hernia surgeries  . Splenectomy  2006  . Cholecystectomy  2007  . Colonoscopy      Done at [facility]  . Upper gastrointestinal endoscopy      Done at [facility]  . Colon surgery  2008    Done at [facility]  . Hernia repair     Family History  Problem Relation Age of Onset  . Healthy Mother   . Prostate cancer Father   . Colon cancer Sister   . Healthy Son    History  Substance Use Topics  . Smoking status: Never Smoker   . Smokeless tobacco: Never Used  . Alcohol Use: No    Review of Systems ROS: Statement: All systems negative except as marked or noted in the HPI; Constitutional: Negative for fever and chills. ; ; Eyes: Negative for eye pain, redness and discharge. ; ; ENMT: Negative for ear pain, hoarseness, nasal congestion, sinus pressure and sore throat. ; ; Cardiovascular: +CP. Negative for palpitations, diaphoresis, dyspnea and peripheral edema. ; ; Respiratory: Negative for cough, wheezing and stridor. ; ; Gastrointestinal: Negative for nausea, vomiting, diarrhea,  abdominal pain, blood in stool, hematemesis, jaundice and rectal bleeding. . ; ; Genitourinary: Negative for dysuria, flank pain and hematuria. ; ; Musculoskeletal: Negative for back pain and neck pain. Negative for swelling and trauma.; ; Skin: Negative for pruritus, rash, abrasions, blisters, bruising and skin lesion.; ; Neuro: Negative for headache, lightheadedness and neck stiffness. Negative for weakness, altered level of consciousness , altered mental status, extremity weakness, paresthesias, involuntary movement, seizure and syncope.      Allergies  Codeine and Oxycodone  Home Medications   Current Outpatient Rx  Name  Route  Sig  Dispense  Refill  . Calcium Carbonate-Vitamin D (CALCIUM 600 + D PO)   Oral   Take by mouth 2 (two) times daily.         . furosemide (LASIX) 40 MG tablet   Oral   Take 20 mg by mouth 2 (two) times daily.          [name] levothyroxine (SYNTHROID, LEVOTHROID) 150 MCG tablet   Oral   Take 150 mcg by mouth daily.         . propranolol (INDERAL) 80 MG tablet   Oral   Take 80 mg by mouth daily.         [name] spironolactone (ALDACTONE) 100 MG tablet   Oral   Take 150-200  mg by mouth 2 (two) times daily. Patient takes 2 tablets in the morning and 1&1/2 tablet in the afternoon         . ursodiol (ACTIGALL) 300 MG capsule   Oral   Take 900 mg by mouth 2 (two) times daily. The patient states that she is taking 3 in the morning and 3 in the evening         . Zinc 50 MG TABS   Oral   Take by mouth 2 (two) times daily.         . ciprofloxacin (CIPRO) 750 MG tablet   Oral   Take 750 mg by mouth once a week.          BP 124/86  Pulse 72  Temp(Src) 98.3 F (36.8 C) (Oral)  Resp 19  Ht 5\' 4"  (1.626 m)  Wt 135 lb (61.236 kg)  BMI 23.16 kg/m2  SpO2 96% Physical Exam 1420: Physical examination:  Nursing notes reviewed; Vital signs and O2 SAT reviewed;  Constitutional: Well developed, Well nourished, Well hydrated, In no acute distress; Head:   Normocephalic, atraumatic; Eyes: EOMI, PERRL, No scleral icterus; ENMT: Mouth and pharynx normal, Mucous membranes moist; Neck: Supple, Full range of motion, No lymphadenopathy; Cardiovascular: Regular rate and rhythm, No gallop; Respiratory: Breath sounds clear & equal bilaterally, No wheezes.  Speaking full sentences with ease, Normal respiratory effort/excursion; Chest: Nontender, Movement normal; Abdomen: Soft, Nontender, Nondistended, Normal bowel sounds; Genitourinary: No CVA tenderness; Extremities: Pulses normal, No tenderness, No edema, No calf edema or asymmetry.; Neuro: AA&Ox3, Major CN grossly intact.  Speech clear. No gross focal motor or sensory deficits in extremities.; Skin: Color normal, Warm, Dry.   ED Course  Procedures   MDM  MDM Reviewed: previous chart, nursing note and vitals Reviewed previous: labs Interpretation: labs, ECG and x-ray      Date: 08/18/2013  Rate: 72  Rhythm: normal sinus rhythm  QRS Axis: normal  Intervals: normal  ST/T Wave abnormalities: normal  Conduction Disutrbances:none  Narrative Interpretation:   Old EKG Reviewed: none available.  Results for orders placed during the hospital encounter of 08/18/13  CBC WITH DIFFERENTIAL      Result Value Range   WBC 12.2 (*) 4.0 - 10.5 K/uL   RBC 3.59 (*) 3.87 - 5.11 MIL/uL   Hemoglobin 10.3 (*) 12.0 - 15.0 g/dL   HCT 04.5 (*) 40.9 - 81.1 %   MCV 86.9  78.0 - 100.0 fL   MCH 28.7  26.0 - 34.0 pg   MCHC 33.0  30.0 - 36.0 g/dL   RDW 91.4 (*) 78.2 - 95.6 %   Platelets 364  150 - 400 K/uL   Neutrophils Relative % 57  43 - 77 %   Neutro Abs 7.0  1.7 - 7.7 K/uL   Lymphocytes Relative 21  12 - 46 %   Lymphs Abs 2.6  0.7 - 4.0 K/uL   Monocytes Relative 18 (*) 3 - 12 %   Monocytes Absolute 2.2 (*) 0.1 - 1.0 K/uL   Eosinophils Relative 2  0 - 5 %   Eosinophils Absolute 0.3  0.0 - 0.7 K/uL   Basophils Relative 1  0 - 1 %   Basophils Absolute 0.2 (*) 0.0 - 0.1 K/uL  COMPREHENSIVE METABOLIC PANEL       Result Value Range   Sodium 129 (*) 135 - 145 mEq/L   Potassium 4.1  3.5 - 5.1 mEq/L   Chloride 95 (*) 96 - 112 mEq/L  CO2 23  19 - 32 mEq/L   Glucose, Bld 149 (*) 70 - 99 mg/dL   BUN 14  6 - 23 mg/dL   Creatinine, Ser 1.61  0.50 - 1.10 mg/dL   Calcium 8.9  8.4 - 09.6 mg/dL   Total Protein 8.3  6.0 - 8.3 g/dL   Albumin 2.4 (*) 3.5 - 5.2 g/dL   AST 26  0 - 37 U/L   ALT 9  0 - 35 U/L   Alkaline Phosphatase 103  39 - 117 U/L   Total Bilirubin 0.9  0.3 - 1.2 mg/dL   GFR calc non Af Amer >90  >90 mL/min   GFR calc Af Amer >90  >90 mL/min  TROPONIN I      Result Value Range   Troponin I <0.30  <0.30 ng/mL  LIPASE, BLOOD      Result Value Range   Lipase 55  11 - 59 U/L   Dg Chest 2 View 08/18/2013   CLINICAL DATA:  Chest pain.  EXAM: CHEST  2 VIEW  COMPARISON:  None.  FINDINGS: Midline trachea. Mild to moderate cardiomegaly. Mediastinal contours otherwise within normal limits. Trace left-sided pleural fluid or thickening. No pneumothorax. No congestive failure. No lobar consolidation. Extensive upper abdominal surgical changes.  IMPRESSION: Cardiomegaly, without congestive failure.  Trace left-sided pleural fluid or thickening.   Electronically Signed   By: Jeronimo Greaves M.D.   On: 08/18/2013 13:57    Results for CORENE, RESNICK (MRN [id number]) as of 08/18/2013 15:34  Ref. Range 12/23/2012 11:40 08/18/2013 13:51  Hemoglobin Latest Range: 12.0-15.0 g/dL 9.0 (L) 91.4 (L)  HCT Latest Range: 36.0-46.0 % 30.1 (L) 31.2 (L)   Results for DEEPA, BARTHEL (MRN [id number]) as of 08/18/2013 15:34  Ref. Range 12/23/2012 11:40 08/18/2013 13:51  Sodium Latest Range: 135-145 mEq/L 135 129 (L)    1640:  Improved after ASA, SL ntg, morphine and GI cocktail. Will re-check 2nd/delta troponin. H/H per baseline. Mildly hyponatremic compared to previous labs. Pt very concerned regarding "any heart issue" that "might hold up my liver transplant" scheduled in the next few weeks.  Dx and testing d/w pt and family.   Questions answered.  Verb understanding, agreeable to observation admit.  T/C to [facility], case discussed, including:  HPI, pertinent PM/SHx, VS/PE, dx testing, ED course and treatment:  Agreeable to observation admit, requests to write temporary orders, obtain tele bed to team 2.   Laray Anger, DO 08/21/13 1408

## 2013-08-18 NOTE — ED Notes (Signed)
Floor unable to take report at this time.

## 2013-08-18 NOTE — H&P (Signed)
[facility] History and Physical  JAYLEE FREEZE WUJ:[id number] DOB: Jun 27, 1951 DOA: 08/18/2013  Referring physician:  PCP: Rudi Heap, MD  Specialists:   Chief Complaint: chest pain   HPI: GAYNA BRADDY is a 62 y.o. female with PMH of hypothyroidism, Primary biliary cirrhosis (awaiting liver transplant) presented with substernal non radiating 8/10 constant chest pain since yesterday; denies exertional symptoms; no SOB, no cough, no fever, no nausea vomiting or diarrhea;   Review of Systems: The patient denies anorexia, fever, weight loss,, vision loss, decreased hearing, hoarseness, syncope, dyspnea on exertion, peripheral edema, balance deficits, hemoptysis, abdominal pain, melena, hematochezia, severe indigestion/heartburn, hematuria, incontinence, genital sores, muscle weakness, suspicious skin lesions, transient blindness, difficulty walking, depression, unusual weight change, abnormal bleeding, enlarged lymph nodes, angioedema, and breast masses.    Past Medical History  Diagnosis Date  . Liver disease   . Hernia of unspecified site of abdominal cavity without mention of obstruction or gangrene    Past Surgical History  Procedure Laterality Date  . Abdominal hernia repair      Patient's states that she has had 8- 9 hernia surgeries  . Splenectomy  2006  . Cholecystectomy  2007  . Colonoscopy      Done at [facility]  . Upper gastrointestinal endoscopy      Done at [facility]  . Colon surgery  2008    Done at [facility]  . Hernia repair     Social History:  reports that she has never smoked. She has never used smokeless tobacco. She reports that she does not drink alcohol or use illicit drugs. Lives at home:  where does patient live--home, ALF, SNF? and with whom if at home? Yes:  Can patient participate in ADLs?  Allergies  Allergen Reactions  . Codeine Nausea Only  . Oxycodone Nausea Only    Family History  Problem Relation Age of Onset  . Healthy Mother   . Prostate cancer  Father   . Colon cancer Sister   . Healthy Son     (be sure to complete)  Prior to Admission medications   Medication Sig Start Date End Date Taking? Authorizing Provider  Calcium Carbonate-Vitamin D (CALCIUM 600 + D PO) Take by mouth 2 (two) times daily.   Yes Historical Provider, MD  furosemide (LASIX) 40 MG tablet Take 20 mg by mouth 2 (two) times daily.    Yes Historical Provider, MD  levothyroxine (SYNTHROID, LEVOTHROID) 150 MCG tablet Take 150 mcg by mouth daily.   Yes Historical Provider, MD  propranolol (INDERAL) 80 MG tablet Take 80 mg by mouth daily.   Yes Historical Provider, MD  spironolactone (ALDACTONE) 100 MG tablet Take 150-200 mg by mouth 2 (two) times daily. Patient takes 2 tablets in the morning and 1&1/2 tablet in the afternoon   Yes Historical Provider, MD  ursodiol (ACTIGALL) 300 MG capsule Take 900 mg by mouth 2 (two) times daily. The patient states that she is taking 3 in the morning and 3 in the evening   Yes Historical Provider, MD  Zinc 50 MG TABS Take by mouth 2 (two) times daily.   Yes Historical Provider, MD  ciprofloxacin (CIPRO) 750 MG tablet Take 750 mg by mouth once a week.    Historical Provider, MD   Physical Exam: Filed Vitals:   08/18/13 1545  BP: 106/64  Pulse: 76  Temp:   Resp:      General:  Alert   Eyes: eom-i  ENT: no oral ulcers   Neck:  supple   Cardiovascular: s1,s2 rrr  Respiratory: cta BL   Abdomen: soft, large hernia, mid abd recent surgical scar with opening wound non draining   Skin: no rash   Musculoskeletal: no le edema   Psychiatric: no hallucinations   Neurologic: cn 2-12 intact   Labs on Admission:  Basic Metabolic Panel:  Recent Labs Lab 08/18/13 1351  NA 129*  K 4.1  CL 95*  CO2 23  GLUCOSE 149*  BUN 14  CREATININE 0.70  CALCIUM 8.9   Liver Function Tests:  Recent Labs Lab 08/18/13 1351  AST 26  ALT 9  ALKPHOS 103  BILITOT 0.9  PROT 8.3  ALBUMIN 2.4*    Recent Labs Lab 08/18/13 1351   LIPASE 55   No results found for this basename: AMMONIA,  in the last 168 hours CBC:  Recent Labs Lab 08/18/13 1351  WBC 12.2*  NEUTROABS 7.0  HGB 10.3*  HCT 31.2*  MCV 86.9  PLT 364   Cardiac Enzymes:  Recent Labs Lab 08/18/13 1351  TROPONINI <0.30    BNP (last 3 results) No results found for this basename: PROBNP,  in the last 8760 hours CBG: No results found for this basename: GLUCAP,  in the last 168 hours  Radiological Exams on Admission: Dg Chest 2 View  08/18/2013   CLINICAL DATA:  Chest pain.  EXAM: CHEST  2 VIEW  COMPARISON:  None.  FINDINGS: Midline trachea. Mild to moderate cardiomegaly. Mediastinal contours otherwise within normal limits. Trace left-sided pleural fluid or thickening. No pneumothorax. No congestive failure. No lobar consolidation. Extensive upper abdominal surgical changes.  IMPRESSION: Cardiomegaly, without congestive failure.  Trace left-sided pleural fluid or thickening.   Electronically Signed   By: Jeronimo Greaves M.D.   On: 08/18/2013 13:57   Dg Chest Portable 1 View  08/18/2013   *RADIOLOGY REPORT*  Clinical Data: Chest pain since yesterday, nonsmoker, subsequent encounter.  PORTABLE CHEST - 1 VIEW  Comparison: Earlier same day  Findings: Grossly unchanged enlarged cardiac silhouette and mediastinal contours.  Decreased lung volumes with corresponding worsening of bibasilar opacities, left greater than right. The pulmonary vasculature is less distinct than present examination with cephalization of flow.  There is a minimal amount of pleural parenchymal thickening along the right minor fissure.  Trace bilateral effusions are not excluded.  No pneumothorax.  Grossly unchanged bones. Surgical clips overlie the upper abdomen.  IMPRESSION: Decreased lung volumes with findings worrisome for mild pulmonary edema and worsening bibasilar atelectasis.   Original Report Authenticated By: Tacey Ruiz, MD    EKG: Independently reviewed. NSR, non specific ST  changes; no previous ecg to compare   Assessment/Plan Active Problems:   PBC (primary biliary cirrhosis)   Hypothyroidism   Ventral hernia, recurrent   Chest pain   62 y.o. female with PMH of hypothyroidism, Primary biliary cirrhosis (awaiting liver transplant) presented with substernal non radiating 8/10 constant chest pain since yesterday;  1. Chest pain atypical presentation; ECG, initial troponins unremarkable; h/o GERD -per patient she recently had extensive cardiac work up during hernia repair; and per patient "tests were normal"; only RHC available in EPIC showed mild pulmonary HTN;  -echo (2012): LVEF 55%; no LHC results available ;  -monitor on tele, serial ECG, troponins; start PPI  2. Primary biliary cirrhosis (awaiting liver transplant)  -cont diuresis on home regimen; cont cipro as ordered  -Patient is noted have been admitted [facility] for hernia repair 3-4 weeks ago. Patient had an infected abdominal mesh  they're quite surgical repair as well as being volume overload requiring aggressive diuresis per patient.  3. Hypo Na likely related to cirrhosis, and diuresis -cont diuresis; monitor Na;   4. Anemia likely AOCD; no s/s of acute bleeding; monitor      NO:  if consultant consulted, please document name and whether formally or informally consulted  Code Status: full (must indicate code status--if unknown or must be presumed, indicate so) Family Communication: none at the bedside (indicate person spoken with, if applicable, with phone number if by telephone) Disposition Plan: home (indicate anticipated LOS)  Time spent: >35 minutes   Esperanza Sheets [facility] Pager [phone]  If 7PM-7AM, please contact night-coverage [email or web address] Password [email or web address] 08/18/2013, 4:55 PM

## 2013-08-18 NOTE — ED Notes (Signed)
Pt c/o central sharp pain to center of chest started after eating banana milkshake around 230pm. Denies radiation/sob/dizziness/n/v/d. Pt arrived laughing with EMS. NAD. Pt has hx of liver failure. No peripheral swelling noted. Denies any other sx's

## 2013-08-18 NOTE — ED Notes (Signed)
Pt came from [facility]. staes gave her aspirin 324 and done a chest xray there. ems gave one ntg sl with no relief.

## 2013-08-19 DIAGNOSIS — I2789 Other specified pulmonary heart diseases: Secondary | ICD-10-CM | POA: Diagnosis not present

## 2013-08-19 DIAGNOSIS — K745 Biliary cirrhosis, unspecified: Secondary | ICD-10-CM | POA: Diagnosis not present

## 2013-08-19 DIAGNOSIS — N179 Acute kidney failure, unspecified: Secondary | ICD-10-CM | POA: Diagnosis not present

## 2013-08-19 DIAGNOSIS — R079 Chest pain, unspecified: Secondary | ICD-10-CM | POA: Diagnosis not present

## 2013-08-19 DIAGNOSIS — E871 Hypo-osmolality and hyponatremia: Secondary | ICD-10-CM | POA: Diagnosis not present

## 2013-08-19 LAB — BASIC METABOLIC PANEL
BUN: 23 mg/dL (ref 6–23)
CO2: 20 mEq/L (ref 19–32)
Calcium: 8.4 mg/dL (ref 8.4–10.5)
Chloride: 91 mEq/L — ABNORMAL LOW (ref 96–112)
Glucose, Bld: 130 mg/dL — ABNORMAL HIGH (ref 70–99)
Sodium: 123 mEq/L — ABNORMAL LOW (ref 135–145)

## 2013-08-19 LAB — TROPONIN I
Troponin I: <0.3 ng/mL (ref <0.30)
Troponin I: <0.3 ng/mL (ref <0.30)

## 2013-08-19 MED ORDER — ZOLPIDEM TARTRATE 5 MG PO TABS
5.0000 mg | ORAL_TABLET | Freq: Once | ORAL | Status: AC
  Administered 2013-08-19: 5 mg via ORAL
  Filled 2013-08-19: qty 1

## 2013-08-19 MED ORDER — SODIUM CHLORIDE 0.9 % IV SOLN
INTRAVENOUS | Status: DC
  Administered 2013-08-19: 15:00:00 via INTRAVENOUS

## 2013-08-19 MED ORDER — DOCUSATE SODIUM 100 MG PO CAPS
200.0000 mg | ORAL_CAPSULE | Freq: Every day | ORAL | Status: DC
  Administered 2013-08-19: 200 mg via ORAL
  Filled 2013-08-19: qty 2

## 2013-08-19 NOTE — Care Management Note (Unsigned)
    Page 1 of 1   08/19/2013     2:48:59 PM   CARE MANAGEMENT NOTE 08/19/2013  Patient:  Cassidy Barber, Cassidy Barber   Account Number:  [id number]  Date Initiated:  08/19/2013  Documentation initiated by:  Rosemary Holms  Subjective/Objective Assessment:   pt admitted from home where she lives with her spouse. Eager to DC. Anticipate DC home. No needs identified.     Action/Plan:   Anticipated DC Date:  08/20/2013   Anticipated DC Plan:  HOME/SELF CARE      DC Planning Services  CM consult      Choice offered to / List presented to:             Status of service:  Completed, signed off Medicare Important Message given?   (If response is "NO", the following Medicare IM given date fields will be blank) Date Medicare IM given:   Date Additional Medicare IM given:    Discharge Disposition:    Per UR Regulation:    If discussed at Long Length of Stay Meetings, dates discussed:    Comments:  08/19/13 Rosemary Holms RN BSN CM

## 2013-08-19 NOTE — Progress Notes (Signed)
[facility] PROGRESS NOTE  Cassidy Barber ZOX:[id number] DOB: 1951-07-29 DOA: 08/18/2013 PCP: Rudi Heap, MD  Assessment/Plan: 62 y.o. female with PMH of hypothyroidism, Primary biliary cirrhosis (awaiting liver transplant) presented with substernal non radiating 8/10 constant chest pain;   1. Chest pain atypical presentation; ECG, troponins unremarkable; h/o GERD  -per patient she recently had extensive cardiac work up during hernia repair; and per patient "tests were normal"; only RHC available in EPIC showed mild pulmonary HTN;  -echo (2012): LVEF 55%; no LHC results available ;  -offered cardiology consultation; patient declined; cont  PPI  2. Primary biliary cirrhosis (awaiting liver transplant)  -cont diuresis on home regimen; cont cipro as ordered  -Patient is noted have been admitted [facility] for hernia repair 3-4 weeks ago. Patient had an infected abdominal mesh they're quite surgical repair as well as being volume overload requiring aggressive diuresis per patient.  3. Hypo Na likely related to cirrhosis, and diuresis  -Na 19-->123; recheck in AM   4. AKI ? Diuretic related; patient reports taking lasix 80 mg BID +spironaloactone 150 mg BID  -decreased lasix to 20 BID; cont spironolactone; cont gentle diuresis due to low urine output   -recheck Na in AM  4. Anemia likely AOCD; no s/s of acute bleeding; monitor    Code Status: full Family Communication: sister at the bedside  (indicate person spoken with, relationship, and if by phone, the number) Disposition Plan: home    Consultants:  None   Procedures:  CXR  Antibiotics:  None  (indicate start date, and stop date if known)  HPI/Subjective: Alert, pain resolved   Objective: Filed Vitals:   08/19/13 0535  BP: 91/58  Pulse: 95  Temp: 98.3 F (36.8 C)  Resp: 20    Intake/Output Summary (Last 24 hours) at 08/19/13 1408 Last data filed at 08/18/13 2300  Gross per 24 hour  Intake 265.17 ml   Output      0 ml  Net 265.17 ml   Filed Weights   08/18/13 1348 08/18/13 1841 08/19/13 0624  Weight: 61.236 kg (135 lb) 61.236 kg (135 lb) 67.994 kg (149 lb 14.4 oz)    Exam:   General:  Alert   Cardiovascular: s1,s2 rrr  Respiratory: few LL crackles      Abdomen: soft, soft, large hernia, mid abd recent surgical scar with opening wound non draining    Musculoskeletal: non pitting edema    Data Reviewed: Basic Metabolic Panel:  Recent Labs Lab 08/18/13 1351 08/19/13 0544  NA 129* 123*  K 4.1 4.7  CL 95* 91*  CO2 23 20  GLUCOSE 149* 130*  BUN 14 23  CREATININE 0.70 1.27*  CALCIUM 8.9 8.4   Liver Function Tests:  Recent Labs Lab 08/18/13 1351  AST 26  ALT 9  ALKPHOS 103  BILITOT 0.9  PROT 8.3  ALBUMIN 2.4*    Recent Labs Lab 08/18/13 1351  LIPASE 55   No results found for this basename: AMMONIA,  in the last 168 hours CBC:  Recent Labs Lab 08/18/13 1351  WBC 12.2*  NEUTROABS 7.0  HGB 10.3*  HCT 31.2*  MCV 86.9  PLT 364   Cardiac Enzymes:  Recent Labs Lab 08/18/13 1351 08/18/13 1637 08/18/13 2243 08/19/13 0544 08/19/13 1048  TROPONINI <0.30 <0.30 <0.30 <0.30 <0.30   BNP (last 3 results) No results found for this basename: PROBNP,  in the last 8760 hours CBG: No results found for this basename: GLUCAP,  in the last 168  hours  No results found for this or any previous visit (from the past 240 hour(s)).   Studies: Dg Chest 2 View  08/18/2013   CLINICAL DATA:  Chest pain.  EXAM: CHEST  2 VIEW  COMPARISON:  None.  FINDINGS: Midline trachea. Mild to moderate cardiomegaly. Mediastinal contours otherwise within normal limits. Trace left-sided pleural fluid or thickening. No pneumothorax. No congestive failure. No lobar consolidation. Extensive upper abdominal surgical changes.  IMPRESSION: Cardiomegaly, without congestive failure.  Trace left-sided pleural fluid or thickening.   Electronically Signed   By: Jeronimo Greaves M.D.   On:  08/18/2013 13:57   Dg Chest Portable 1 View  08/18/2013   *RADIOLOGY REPORT*  Clinical Data: Chest pain since yesterday, nonsmoker, subsequent encounter.  PORTABLE CHEST - 1 VIEW  Comparison: Earlier same day  Findings: Grossly unchanged enlarged cardiac silhouette and mediastinal contours.  Decreased lung volumes with corresponding worsening of bibasilar opacities, left greater than right. The pulmonary vasculature is less distinct than present examination with cephalization of flow.  There is a minimal amount of pleural parenchymal thickening along the right minor fissure.  Trace bilateral effusions are not excluded.  No pneumothorax.  Grossly unchanged bones. Surgical clips overlie the upper abdomen.  IMPRESSION: Decreased lung volumes with findings worrisome for mild pulmonary edema and worsening bibasilar atelectasis.   Original Report Authenticated By: Tacey Ruiz, MD    Scheduled Meds: . ciprofloxacin  750 mg Oral Weekly  . enoxaparin (LOVENOX) injection  40 mg Subcutaneous Q24H  . furosemide  20 mg Oral BID  . levothyroxine  150 mcg Oral QAC breakfast  . pantoprazole  40 mg Oral Daily  . propranolol  80 mg Oral Daily  . sodium chloride  3 mL Intravenous Q12H  . spironolactone  150 mg Oral QAC supper  . spironolactone  200 mg Oral QAC breakfast  . ursodiol  900 mg Oral BID   Continuous Infusions:   Active Problems:   PBC (primary biliary cirrhosis)   Hypothyroidism   Ventral hernia, recurrent   Chest pain    Time spent: >35 minutes     Esperanza Sheets  [facility] Pager [phone]. If 7PM-7AM, please contact night-coverage at [email or web address], password [facility] 08/19/2013, 2:08 PM  LOS: 1 day

## 2013-08-19 NOTE — Progress Notes (Signed)
UR Chart Review Completed  

## 2013-08-20 DIAGNOSIS — R079 Chest pain, unspecified: Principal | ICD-10-CM

## 2013-08-20 DIAGNOSIS — R109 Unspecified abdominal pain: Secondary | ICD-10-CM | POA: Diagnosis not present

## 2013-08-20 DIAGNOSIS — E871 Hypo-osmolality and hyponatremia: Secondary | ICD-10-CM

## 2013-08-20 LAB — BASIC METABOLIC PANEL
CO2: 21 mEq/L (ref 19–32)
Chloride: 90 mEq/L — ABNORMAL LOW (ref 96–112)
GFR calc Af Amer: 40 mL/min — ABNORMAL LOW (ref >90)
Potassium: 5 mEq/L (ref 3.5–5.1)
Sodium: 120 mEq/L — ABNORMAL LOW (ref 135–145)

## 2013-08-20 NOTE — Progress Notes (Signed)
Discharge instructions given on medications,and follow up visits,patient verbalized understanding. Accompanied by staff to an awaiting vehicle. 

## 2013-08-20 NOTE — Discharge Summary (Signed)
Physician Discharge Summary  FALANA CLAGG ONG:[id number] DOB: Jun 03, 1951 DOA: 08/18/2013  PCP: Rudi Heap, MD  Admit date: 08/18/2013 Discharge date: 08/20/2013  Time spent: 45 minutes  Recommendations for Outpatient Follow-up:  -Will be discharged home today, has appointment at [facility] tomorrow for continued workup for her planned liver transplant. -We'll need a BMET to recheck her sodium levels in no more than 2-3 days.   Discharge Diagnoses:  Principal Problem:   Chest pain Active Problems:   PBC (primary biliary cirrhosis)   Hypothyroidism   Ventral hernia, recurrent   Discharge Condition: Stable and improved  Filed Weights   08/18/13 1348 08/18/13 1841 08/19/13 0624  Weight: 61.236 kg (135 lb) 61.236 kg (135 lb) 67.994 kg (149 lb 14.4 oz)    History of present illness:  Cassidy Barber is a 62 y.o. female with PMH of hypothyroidism, Primary biliary cirrhosis (awaiting liver transplant) presented with substernal non radiating 8/10 constant chest pain since yesterday; denies exertional symptoms; no SOB, no cough, no fever, no nausea vomiting or diarrhea. We were asked to admit her for further evaluation and management.   Hospital Course:   Chest pain -Resolved, no further episodes while in the hospital. -Has ruled out for acute coronary syndrome with 3 sets of negative troponins and serial EKGs that did not demonstrate acute ischemic changes. -Patient has had recent extensive cardiology workup at [facility] in preparation for her liver transplant, she states she had a 2-D echocardiogram 2 weeks ago (results not available), she has declined cardiology consultation this admission.  Primary biliary cirrhosis -Awaiting liver transplant.  Hyponatremia -Related to cirrhosis. -Sodium has remained stable around 120-123, she is asymptomatic.  Acute renal failure -Creatinine around 1.2-1.3 this hospitalization. -Her Lasix has been decreased from 80 twice a day to 20 twice a day,  spironolactone has been continued. -Will need to have her renal function rechecked upon followup at [facility].  Procedures:  None   Consultations:  None  Discharge Instructions  Discharge Orders   Future Appointments Provider Department Dept Phone   09/28/2013 9:30 AM Malissa Hippo, MD [facility] [phone]   Future Orders Complete By Expires   Discontinue IV  As directed    Increase activity slowly  As directed        Medication List         ALDACTONE 100 MG tablet  Generic drug:  spironolactone  Take 150-200 mg by mouth 2 (two) times daily. Patient takes 2 tablets in the morning and 1&1/2 tablet in the afternoon     CALCIUM 600 + D PO  Take by mouth 2 (two) times daily.     ciprofloxacin 750 MG tablet  Commonly known as:  CIPRO  Take 750 mg by mouth once a week.     furosemide 40 MG tablet  Commonly known as:  LASIX  Take 20 mg by mouth 2 (two) times daily.     levothyroxine 150 MCG tablet  Commonly known as:  SYNTHROID, LEVOTHROID  Take 150 mcg by mouth daily.     propranolol 80 MG tablet  Commonly known as:  INDERAL  Take 80 mg by mouth daily.     ursodiol 300 MG capsule  Commonly known as:  ACTIGALL  Take 900 mg by mouth 2 (two) times daily. The patient states that she is taking 3 in the morning and 3 in the evening     Zinc 50 MG Tabs  Take by mouth 2 (two) times daily.  Allergies  Allergen Reactions  . Codeine Nausea Only  . Oxycodone Nausea Only       Follow-up Information   Follow up with Rudi Heap, MD. Schedule an appointment as soon as possible for a visit in 2 weeks.   Specialty:  Family Medicine   Contact information:   [address] [place] [address] [phone]        The results of significant diagnostics from this hospitalization (including imaging, microbiology, ancillary and laboratory) are listed below for reference.    Significant Diagnostic Studies: Dg Chest 2 View  08/18/2013    CLINICAL DATA:  Chest pain.  EXAM: CHEST  2 VIEW  COMPARISON:  None.  FINDINGS: Midline trachea. Mild to moderate cardiomegaly. Mediastinal contours otherwise within normal limits. Trace left-sided pleural fluid or thickening. No pneumothorax. No congestive failure. No lobar consolidation. Extensive upper abdominal surgical changes.  IMPRESSION: Cardiomegaly, without congestive failure.  Trace left-sided pleural fluid or thickening.   Electronically Signed   By: Jeronimo Greaves M.D.   On: 08/18/2013 13:57   Dg Chest Portable 1 View  08/18/2013   *RADIOLOGY REPORT*  Clinical Data: Chest pain since yesterday, nonsmoker, subsequent encounter.  PORTABLE CHEST - 1 VIEW  Comparison: Earlier same day  Findings: Grossly unchanged enlarged cardiac silhouette and mediastinal contours.  Decreased lung volumes with corresponding worsening of bibasilar opacities, left greater than right. The pulmonary vasculature is less distinct than present examination with cephalization of flow.  There is a minimal amount of pleural parenchymal thickening along the right minor fissure.  Trace bilateral effusions are not excluded.  No pneumothorax.  Grossly unchanged bones. Surgical clips overlie the upper abdomen.  IMPRESSION: Decreased lung volumes with findings worrisome for mild pulmonary edema and worsening bibasilar atelectasis.   Original Report Authenticated By: Tacey Ruiz, MD    Microbiology: No results found for this or any previous visit (from the past 240 hour(s)).   Labs: Basic Metabolic Panel:  Recent Labs Lab 08/18/13 1351 08/19/13 0544 08/20/13 0545  NA 129* 123* 120*  K 4.1 4.7 5.0  CL 95* 91* 90*  CO2 23 20 21   GLUCOSE 149* 130* 130*  BUN 14 23 33*  CREATININE 0.70 1.27* 1.57*  CALCIUM 8.9 8.4 8.4   Liver Function Tests:  Recent Labs Lab 08/18/13 1351  AST 26  ALT 9  ALKPHOS 103  BILITOT 0.9  PROT 8.3  ALBUMIN 2.4*    Recent Labs Lab 08/18/13 1351  LIPASE 55   No results found for  this basename: AMMONIA,  in the last 168 hours CBC:  Recent Labs Lab 08/18/13 1351  WBC 12.2*  NEUTROABS 7.0  HGB 10.3*  HCT 31.2*  MCV 86.9  PLT 364   Cardiac Enzymes:  Recent Labs Lab 08/18/13 1351 08/18/13 1637 08/18/13 2243 08/19/13 0544 08/19/13 1048  TROPONINI <0.30 <0.30 <0.30 <0.30 <0.30   BNP: BNP (last 3 results) No results found for this basename: PROBNP,  in the last 8760 hours CBG: No results found for this basename: GLUCAP,  in the last 168 hours     Signed:  Chaya Jan  [facility] Pager: [phone] 08/20/2013, 10:37 AM

## 2013-08-24 DIAGNOSIS — K745 Biliary cirrhosis, unspecified: Secondary | ICD-10-CM | POA: Diagnosis not present

## 2013-08-31 DIAGNOSIS — R188 Other ascites: Secondary | ICD-10-CM | POA: Diagnosis not present

## 2013-08-31 DIAGNOSIS — K469 Unspecified abdominal hernia without obstruction or gangrene: Secondary | ICD-10-CM | POA: Diagnosis not present

## 2013-08-31 DIAGNOSIS — Z9889 Other specified postprocedural states: Secondary | ICD-10-CM | POA: Diagnosis not present

## 2013-08-31 DIAGNOSIS — Z006 Encounter for examination for normal comparison and control in clinical research program: Secondary | ICD-10-CM | POA: Diagnosis not present

## 2013-08-31 DIAGNOSIS — K7689 Other specified diseases of liver: Secondary | ICD-10-CM | POA: Diagnosis not present

## 2013-08-31 DIAGNOSIS — K7460 Unspecified cirrhosis of liver: Secondary | ICD-10-CM | POA: Diagnosis not present

## 2013-08-31 DIAGNOSIS — Z01818 Encounter for other preprocedural examination: Secondary | ICD-10-CM | POA: Diagnosis not present

## 2013-09-07 DIAGNOSIS — K745 Biliary cirrhosis, unspecified: Secondary | ICD-10-CM | POA: Diagnosis not present

## 2013-09-08 ENCOUNTER — Ambulatory Visit (INDEPENDENT_AMBULATORY_CARE_PROVIDER_SITE_OTHER): Payer: Medicare Other | Admitting: General Practice

## 2013-09-08 ENCOUNTER — Ambulatory Visit (INDEPENDENT_AMBULATORY_CARE_PROVIDER_SITE_OTHER): Payer: Medicare Other

## 2013-09-08 ENCOUNTER — Encounter: Payer: Self-pay | Admitting: General Practice

## 2013-09-08 VITALS — BP 110/70 | HR 75 | Temp 97.1°F | Ht 64.0 in | Wt 140.0 lb

## 2013-09-08 DIAGNOSIS — R52 Pain, unspecified: Secondary | ICD-10-CM

## 2013-09-08 DIAGNOSIS — S2239XA Fracture of one rib, unspecified side, initial encounter for closed fracture: Secondary | ICD-10-CM | POA: Diagnosis not present

## 2013-09-08 DIAGNOSIS — S2232XA Fracture of one rib, left side, initial encounter for closed fracture: Secondary | ICD-10-CM

## 2013-09-08 MED ORDER — HYDROCODONE-ACETAMINOPHEN 5-325 MG PO TABS: 1.0000 | ORAL_TABLET | Freq: Three times a day (TID) | ORAL | Status: DC | PRN

## 2013-09-08 NOTE — Progress Notes (Signed)
  Subjective:    Patient ID: Cassidy Barber, female    DOB: 08-12-1951, 62 y.o.   MRN: [id number]  HPI Patient presents today for follow up from fall. She reports falling two days ago while walking up relatives driveway. She is unsure of how she fell, but landed in the grass and impact on left side chest area. She denies any increased shortness of breath.  She reports being scheduled for a liver transplant is scheduled for Thursday September 17, 2013.     Review of Systems  Constitutional: Negative for fever and chills.  Respiratory: Negative for chest tightness and shortness of breath.   Cardiovascular: Negative for chest pain and palpitations.  Musculoskeletal:       Left chest pain with taking deep breath  Neurological: Negative for dizziness, weakness and headaches.       Objective:   Physical Exam  Constitutional: She is oriented to person, place, and time. She appears well-developed and well-nourished.  Cardiovascular: Normal rate, regular rhythm and normal heart sounds.   Pulmonary/Chest: Effort normal. No respiratory distress. She exhibits no tenderness.  Decreased left lower base breath sounds  Neurological: She is alert and oriented to person, place, and time.  Skin: Skin is warm and dry.  Psychiatric: She has a normal mood and affect.     [facility] reading (PRIMARY) by  Coralie Keens, FNP-C, left pleural fluid noted.                                                                    Assessment & Plan:  1. Pain  - DG Ribs Unilateral Left; Future (final reports received and discussed with patient)  2. Left rib fracture, closed, initial encounter  - HYDROcodone-acetaminophen (NORCO/VICODIN) 5-325 MG per tablet; Take 1 tablet by mouth every 8 (eight) hours as needed for pain.  Dispense: 10 tablet; Refill: 0 -Patient transplant coordinator at [facility] notified and faxed copy of xray report per patient request. Coordinator verbalized that  patient would be contacted via phone, for admission into hospital on Thursday, Oct. 30 instead of Monday Nov. 3rd.  -instructed patient to seek emergency medical treatment if shortness of breath worsens or increased pain -Patient verbalized understanding Coralie Keens, FNP-C

## 2013-09-08 NOTE — Patient Instructions (Signed)
Rib Fracture Your caregiver has diagnosed you as having a rib fracture (a break). This can occur by a blow to the chest, by a fall against a hard object, or by violent coughing or sneezing. There may be one or many breaks. Rib fractures may heal on their own within 3 to 8 weeks. The longer healing period is usually associated with a continued cough or other aggravating activities. HOME CARE INSTRUCTIONS   Avoid strenuous activity. Be careful during activities and avoid bumping the injured rib. Activities that cause pain pull on the fracture site(s) and are best avoided if possible.  Eat a normal, well-balanced diet. Drink plenty of fluids to avoid constipation.  Take deep breaths several times a day to keep lungs free of infection. Try to cough several times a day, splinting the injured area with a pillow. This will help prevent pneumonia.  Do not wear a rib belt or binder. These restrict breathing which can lead to pneumonia.  Only take over-the-counter or prescription medicines for pain, discomfort, or fever as directed by your caregiver. SEEK MEDICAL CARE IF:  You develop a continual cough, associated with thick or bloody sputum. SEEK IMMEDIATE MEDICAL CARE IF:   You have a fever.  You have difficulty breathing.  You have nausea (feeling sick to your stomach), vomiting, or abdominal (belly) pain.  You have worsening pain, not controlled with medications. Document Released: 10/29/2005 Document Revised: 01/21/2012 Document Reviewed: 04/02/2007 [facility] Patient Information 2014 [place], [place].  Hemothorax A hemothorax is a collection of blood in the space between the chest wall and the lung. The medical term for this space is the pleural cavity. It is also called the pleural space. The most common cause for this condition is a chest injury. It can also happen from:  Diseases in the chest.  Blood clotting problems.  Taking blood thinning medicine.  Cancer of the chest.  Lung and  heart surgeries. Mild cases of hemothorax may clear without treatment in a couple weeks. More severe hemothorax may require surgical treatment. Because the blood compresses the lung and takes up space, some of the symptoms from this are:  Rapid, difficult breathing and shortness of breath.  Rapid heart rate, anxiety, and restlessness.  The blood pressure may be low and not high enough to support life. Sometimes with a hemothorax there is also pneumothorax. This means air in the chest. The air is not in the lung but is located outside the lung between the lung and the chest wall. When this happens it can cause added problems because the necessary lung space is taken up by something that is preventing the lungs from working. DIAGNOSIS  Your caregiver can usually tell what is wrong by examination and a chest X-ray. TREATMENT   If a hemothorax is mild, it may be watched to see if it will get better without treatment.  A more severe case may require having a tube put in to drain the pleural space of the lung. This is called a thoracostomy. It may also be called a chest tube or chest drain.  If bleeding continues, surgery may be required to go into the chest to stop the bleeding. Opening the chest is called a thoracotomy. Document Released: 07/25/2004 Document Revised: 01/21/2012 Document Reviewed: 08/18/2008 [facility] Patient Information 2014 [place], [place].

## 2013-09-09 ENCOUNTER — Telehealth (INDEPENDENT_AMBULATORY_CARE_PROVIDER_SITE_OTHER): Payer: Self-pay | Admitting: *Deleted

## 2013-09-09 NOTE — Telephone Encounter (Signed)
Forwarded to Dr.Rehman as a FYI. 

## 2013-09-09 NOTE — Telephone Encounter (Signed)
Cassidy Barber cancelled her apt for 09/28/13, she will be having her liver transplant.

## 2013-09-10 NOTE — Telephone Encounter (Signed)
Thanks for letting me know!

## 2013-09-14 DIAGNOSIS — K745 Biliary cirrhosis, unspecified: Secondary | ICD-10-CM | POA: Diagnosis present

## 2013-09-14 DIAGNOSIS — I10 Essential (primary) hypertension: Secondary | ICD-10-CM | POA: Diagnosis present

## 2013-09-14 DIAGNOSIS — E039 Hypothyroidism, unspecified: Secondary | ICD-10-CM | POA: Diagnosis present

## 2013-09-14 DIAGNOSIS — K219 Gastro-esophageal reflux disease without esophagitis: Secondary | ICD-10-CM | POA: Diagnosis present

## 2013-09-14 DIAGNOSIS — I2789 Other specified pulmonary heart diseases: Secondary | ICD-10-CM | POA: Diagnosis present

## 2013-09-28 ENCOUNTER — Ambulatory Visit (INDEPENDENT_AMBULATORY_CARE_PROVIDER_SITE_OTHER): Payer: Medicare Other | Admitting: Internal Medicine

## 2013-10-26 ENCOUNTER — Encounter (HOSPITAL_COMMUNITY): Payer: Self-pay | Admitting: Emergency Medicine

## 2013-10-26 ENCOUNTER — Inpatient Hospital Stay (HOSPITAL_COMMUNITY)
Admission: EM | Admit: 2013-10-26 | Discharge: 2013-10-27 | DRG: 689 | Disposition: A | Discharge status: Home / Self Care | Payer: Medicare Other | Attending: Family Medicine | Admitting: Family Medicine

## 2013-10-26 DIAGNOSIS — Z800 Family history of malignant neoplasm of digestive organs: Secondary | ICD-10-CM

## 2013-10-26 DIAGNOSIS — K769 Liver disease, unspecified: Secondary | ICD-10-CM | POA: Diagnosis not present

## 2013-10-26 DIAGNOSIS — G9340 Encephalopathy, unspecified: Secondary | ICD-10-CM

## 2013-10-26 DIAGNOSIS — D638 Anemia in other chronic diseases classified elsewhere: Secondary | ICD-10-CM

## 2013-10-26 DIAGNOSIS — K7682 Hepatic encephalopathy: Secondary | ICD-10-CM | POA: Diagnosis not present

## 2013-10-26 DIAGNOSIS — N390 Urinary tract infection, site not specified: Principal | ICD-10-CM | POA: Diagnosis present

## 2013-10-26 DIAGNOSIS — K7290 Hepatic failure, unspecified without coma: Secondary | ICD-10-CM | POA: Diagnosis present

## 2013-10-26 DIAGNOSIS — K743 Primary biliary cirrhosis: Secondary | ICD-10-CM

## 2013-10-26 DIAGNOSIS — E7220 Disorder of urea cycle metabolism, unspecified: Secondary | ICD-10-CM | POA: Diagnosis present

## 2013-10-26 DIAGNOSIS — K745 Biliary cirrhosis, unspecified: Secondary | ICD-10-CM | POA: Diagnosis not present

## 2013-10-26 DIAGNOSIS — R4182 Altered mental status, unspecified: Secondary | ICD-10-CM | POA: Diagnosis not present

## 2013-10-26 DIAGNOSIS — Z9089 Acquired absence of other organs: Secondary | ICD-10-CM

## 2013-10-26 DIAGNOSIS — E871 Hypo-osmolality and hyponatremia: Secondary | ICD-10-CM | POA: Diagnosis present

## 2013-10-26 HISTORY — DX: Primary biliary cirrhosis: K74.3

## 2013-10-26 HISTORY — DX: Essential (primary) hypertension: I10

## 2013-10-26 HISTORY — DX: Anemia in other chronic diseases classified elsewhere: D63.8

## 2013-10-26 LAB — CBC WITH DIFFERENTIAL/PLATELET
Basophils Absolute: 0.2 10*3/uL — ABNORMAL HIGH (ref 0.0–0.1)
Eosinophils Relative: 3 % (ref 0–5)
HCT: 31.2 % — ABNORMAL LOW (ref 36.0–46.0)
Hemoglobin: 10 g/dL — ABNORMAL LOW (ref 12.0–15.0)
Lymphocytes Relative: 33 % (ref 12–46)
Lymphs Abs: 2.2 10*3/uL (ref 0.7–4.0)
MCV: 83.9 fL (ref 78.0–100.0)
Monocytes Absolute: 1 10*3/uL (ref 0.1–1.0)
Monocytes Relative: 15 % — ABNORMAL HIGH (ref 3–12)
Neutro Abs: 3.1 10*3/uL (ref 1.7–7.7)
Neutrophils Relative %: 47 % (ref 43–77)
RBC: 3.72 MIL/uL — ABNORMAL LOW (ref 3.87–5.11)
RDW: 17.6 % — ABNORMAL HIGH (ref 11.5–15.5)
WBC: 6.7 10*3/uL (ref 4.0–10.5)

## 2013-10-26 LAB — URINALYSIS, ROUTINE W REFLEX MICROSCOPIC
Bilirubin Urine: NEGATIVE
Glucose, UA: NEGATIVE mg/dL
Hgb urine dipstick: NEGATIVE
Specific Gravity, Urine: 1.01 (ref 1.005–1.030)
Urobilinogen, UA: 0.2 mg/dL (ref 0.0–1.0)

## 2013-10-26 LAB — URINE MICROSCOPIC-ADD ON

## 2013-10-26 LAB — COMPREHENSIVE METABOLIC PANEL
ALT: 17 U/L (ref 0–35)
AST: 42 U/L — ABNORMAL HIGH (ref 0–37)
Albumin: 2.4 g/dL — ABNORMAL LOW (ref 3.5–5.2)
BUN: 15 mg/dL (ref 6–23)
CO2: 20 mEq/L (ref 19–32)
Chloride: 101 mEq/L (ref 96–112)
Creatinine, Ser: 0.9 mg/dL (ref 0.50–1.10)
GFR calc Af Amer: 78 mL/min — ABNORMAL LOW (ref >90)
GFR calc non Af Amer: 67 mL/min — ABNORMAL LOW (ref >90)
Glucose, Bld: 129 mg/dL — ABNORMAL HIGH (ref 70–99)
Total Bilirubin: 0.4 mg/dL (ref 0.3–1.2)

## 2013-10-26 LAB — APTT: aPTT: 30 seconds (ref 24–37)

## 2013-10-26 MED ORDER — DEXTROSE 5 % IV SOLN
1.0000 g | INTRAVENOUS | Status: DC
  Administered 2013-10-27: 1 g via INTRAVENOUS
  Filled 2013-10-26 (×2): qty 10

## 2013-10-26 MED ORDER — DEXTROSE 5 % IV SOLN
1.0000 g | Freq: Once | INTRAVENOUS | Status: AC
  Administered 2013-10-26: 1 g via INTRAVENOUS
  Filled 2013-10-26: qty 10

## 2013-10-26 MED ORDER — PROPRANOLOL HCL 20 MG PO TABS
60.0000 mg | ORAL_TABLET | Freq: Every day | ORAL | Status: DC
Start: 2013-10-26 — End: 2013-10-27
  Administered 2013-10-26 – 2013-10-27 (×2): 60 mg via ORAL
  Filled 2013-10-26 (×2): qty 3

## 2013-10-26 MED ORDER — LACTULOSE 10 GM/15ML PO SOLN
ORAL | Status: AC
  Filled 2013-10-26: qty 30

## 2013-10-26 MED ORDER — HEPARIN SODIUM (PORCINE) 5000 UNIT/ML IJ SOLN
5000.0000 [IU] | Freq: Three times a day (TID) | INTRAMUSCULAR | Status: DC
  Administered 2013-10-26 – 2013-10-27 (×3): 5000 [IU] via SUBCUTANEOUS
  Filled 2013-10-26 (×3): qty 1

## 2013-10-26 MED ORDER — SODIUM CHLORIDE 0.9 % IV SOLN: 250.0000 mL | INTRAVENOUS | Status: DC | PRN

## 2013-10-26 MED ORDER — URSODIOL 300 MG PO CAPS
900.0000 mg | ORAL_CAPSULE | Freq: Two times a day (BID) | ORAL | Status: DC
  Administered 2013-10-26 – 2013-10-27 (×2): 900 mg via ORAL
  Filled 2013-10-26 (×4): qty 3

## 2013-10-26 MED ORDER — SODIUM CHLORIDE 0.9 % IJ SOLN
3.0000 mL | Freq: Two times a day (BID) | INTRAMUSCULAR | Status: DC
  Administered 2013-10-26 – 2013-10-27 (×2): 3 mL via INTRAVENOUS

## 2013-10-26 MED ORDER — LACTULOSE 10 GM/15ML PO SOLN
20.0000 g | Freq: Three times a day (TID) | ORAL | Status: DC
  Administered 2013-10-26 – 2013-10-27 (×2): 20 g via ORAL
  Filled 2013-10-26 (×2): qty 30

## 2013-10-26 MED ORDER — ZOLPIDEM TARTRATE 5 MG PO TABS
5.0000 mg | ORAL_TABLET | Freq: Once | ORAL | Status: AC
  Administered 2013-10-26: 5 mg via ORAL
  Filled 2013-10-26: qty 1

## 2013-10-26 MED ORDER — LACTULOSE 10 GM/15ML PO SOLN
30.0000 g | Freq: Once | ORAL | Status: AC
  Administered 2013-10-26: 30 g via ORAL
  Filled 2013-10-26: qty 60

## 2013-10-26 MED ORDER — LEVOTHYROXINE SODIUM 75 MCG PO TABS
150.0000 ug | ORAL_TABLET | Freq: Every day | ORAL | Status: DC
  Administered 2013-10-26 – 2013-10-27 (×2): 150 ug via ORAL
  Filled 2013-10-26 (×2): qty 2

## 2013-10-26 MED ORDER — SPIRONOLACTONE 25 MG PO TABS
200.0000 mg | ORAL_TABLET | Freq: Every day | ORAL | Status: DC
  Administered 2013-10-27: 200 mg via ORAL
  Filled 2013-10-26: qty 8

## 2013-10-26 MED ORDER — FUROSEMIDE 40 MG PO TABS
40.0000 mg | ORAL_TABLET | Freq: Two times a day (BID) | ORAL | Status: DC
  Administered 2013-10-26 – 2013-10-27 (×2): 40 mg via ORAL
  Filled 2013-10-26 (×2): qty 1

## 2013-10-26 MED ORDER — ONDANSETRON HCL 4 MG PO TABS: 4.0000 mg | ORAL_TABLET | Freq: Four times a day (QID) | ORAL | Status: DC | PRN

## 2013-10-26 MED ORDER — SODIUM CHLORIDE 0.9 % IJ SOLN: 3.0000 mL | INTRAMUSCULAR | Status: DC | PRN

## 2013-10-26 MED ORDER — SPIRONOLACTONE 25 MG PO TABS: 100.0000 mg | ORAL_TABLET | Freq: Two times a day (BID) | ORAL | Status: DC

## 2013-10-26 MED ORDER — ONDANSETRON HCL 4 MG/2ML IJ SOLN
4.0000 mg | Freq: Four times a day (QID) | INTRAMUSCULAR | Status: DC | PRN
  Administered 2013-10-26: 4 mg via INTRAVENOUS
  Filled 2013-10-26: qty 2

## 2013-10-26 MED ORDER — SPIRONOLACTONE 25 MG PO TABS
100.0000 mg | ORAL_TABLET | Freq: Every day | ORAL | Status: DC
  Administered 2013-10-26: 100 mg via ORAL
  Filled 2013-10-26: qty 4

## 2013-10-26 NOTE — H&P (Signed)
History and Physical  Cassidy Barber UJW:[id number] DOB: [birth year] DOA: 10/26/2013  Referring physician: Donnetta Hutching, MD in ED PCP: Rudi Heap, MD   Chief Complaint: Confused  HPI:  62 year old woman presents the emergency department with acute onset of confusion today. Initial evaluation suggested UTI and hepatic encephalopathy and the patient was referred for admission.  Obtained from patient, husband and sister at bedside. Patient has been sick over the last week to 10 days with episodes of diarrhea earlier in the week which have resolved. She had one episode of vomiting today. Husband notes that he came home today and found her confused on the commode and so brought her to the hospital for further evaluation. She has a history of primary biliary cirrhosis and liver transplant scheduled for January 8 (donor is family member). No specific aggravating or alleviating factors. She has never had hepatic encephalopathy by report  In the emergency deparment now to be afebrile stable vital signs. No hypoxia. Basic metabolic panel notable for sodium 132. LFTs unremarkable. Albumin 2.4. INR normal. Hemoglobin stable 10.0. Urinalysis grossly positive. Ammonia 171.  Review of Systems:  Negative for fever, visual changes, sore throat, rash, new muscle aches, chest pain, SOB, dysuria, bleeding  Past Medical History  Diagnosis Date  . Liver disease   . Hernia of unspecified site of abdominal cavity without mention of obstruction or gangrene   . Thyroid disease     Past Surgical History  Procedure Laterality Date  . Abdominal hernia repair      Patient's states that she has had 8- 9 hernia surgeries  . Splenectomy  2006  . Cholecystectomy  2007  . Colonoscopy      Done [facility]  . Upper gastrointestinal endoscopy      Done at [facility]  . Colon surgery  2008    Done at [facility]  . Hernia repair      Social History:  reports that she has never smoked. She has never used smokeless tobacco. She reports  that she does not drink alcohol or use illicit drugs.  Allergies  Allergen Reactions  . Codeine Nausea Only  . Oxycodone Nausea Only    Family History  Problem Relation Age of Onset  . Healthy Mother   . Prostate cancer Father   . Colon cancer Sister   . Healthy Son      Prior to Admission medications   Medication Sig Start Date End Date Taking? Authorizing Provider  Calcium Carbonate-Vitamin D (CALCIUM 600 + D PO) Take by mouth 2 (two) times daily.   Yes Historical Provider, MD  ciprofloxacin (CIPRO) 750 MG tablet Take 750 mg by mouth once a week.   Yes Historical Provider, MD  furosemide (LASIX) 40 MG tablet Take 20 mg by mouth 2 (two) times daily.    Yes Historical Provider, MD  levothyroxine (SYNTHROID, LEVOTHROID) 150 MCG tablet Take 150 mcg by mouth daily.   Yes Historical Provider, MD  propranolol (INDERAL) 60 MG tablet Take 60 mg by mouth daily.   Yes Historical Provider, MD  spironolactone (ALDACTONE) 100 MG tablet Take 100-200 mg by mouth 2 (two) times daily. takes 2 tablets in the morning and 1 tablet in the afternoon   Yes Historical Provider, MD  ursodiol (ACTIGALL) 300 MG capsule Take 900 mg by mouth 2 (two) times daily. The patient states that she is taking 3 in the morning and 3 in the evening   Yes Historical Provider, MD  Zinc 50 MG TABS Take by  mouth 2 (two) times daily.   Yes Historical Provider, MD   Physical Exam: Filed Vitals:   10/26/13 0930  BP: 119/66  Pulse: 67  Temp: 97.6 F (36.4 C)  TempSrc: Oral  Resp: 18  Height: 5\' 3"  (1.6 m)  Weight: 68.04 kg (150 lb)  SpO2: 100%    General: Appears calm and comfortable Eyes: PERRL, normal lids, irises  ENT: grossly normal hearing, lips Neck: no LAD, masses or thyromegaly Cardiovascular: RRR, no m/r/g. No LE edema. Respiratory: CTA bilaterally, no w/r/r. Normal respiratory effort. Abdomen: soft, massive right-sided hernia, nontender Skin: no rash or induration s Musculoskeletal: grossly normal tone  BUE/BLE. Grossly normal strength all extremities. Psychiatric: Slightly confused, alert and oriented to self, hospital, month, confused as to year. Neurologic: Cranial nerves 2-12 appear intact.  Wt Readings from Last 3 Encounters:  10/26/13 68.04 kg (150 lb)  09/08/13 63.504 kg (140 lb)  08/19/13 67.994 kg (149 lb 14.4 oz)    Labs on Admission:  Basic Metabolic Panel:  Recent Labs Lab 10/26/13 0950  NA 132*  K 4.1  CL 101  CO2 20  GLUCOSE 129*  BUN 15  CREATININE 0.90  CALCIUM 8.6    Liver Function Tests:  Recent Labs Lab 10/26/13 0950  AST 42*  ALT 17  ALKPHOS 116  BILITOT 0.4  PROT 8.5*  ALBUMIN 2.4*    Recent Labs Lab 10/26/13 0950  AMMONIA 171*    CBC:  Recent Labs Lab 10/26/13 0950  WBC 6.7  NEUTROABS 3.1  HGB 10.0*  HCT 31.2*  MCV 83.9  PLT 481*    EKG: Independently reviewed. Sinus rhythm, no acute changes   Principal Problem:   UTI (lower urinary tract infection) Active Problems:   Hepatic encephalopathy   Acute encephalopathy   Anemia of chronic disease   Primary biliary cirrhosis   Assessment/Plan 1. Acute encephalopathy, likely from UTI, possibly contributed to by hepatic encephalopathy 2. UTI 3. Hyperammonemia 4. Possible hepatic encephalopathy 5. Anemia of chronic disease, at baseline 6. Liver failure with hyponatremia, laboratory parameters appear to be at baseline 7. Primary biliary cirrhosis, followed at [facility] with transplant planned   Admit to medical floor  Empiric antibiotics, followup urine culture  Treat hyperammonemia with lactulose  CMP, CBC, ammonia in AM  Med reconciliation  Discussed above plan with husband and sister at bedside, all questions answered  Code Status: full code  DVT prophylaxis:heparin Family Communication:  Disposition Plan/Anticipated LOS: admit 2 days  Time spent: 65 minutes  Brendia Sacks, MD  [facility] Pager [phone] 10/26/2013, 12:05 PM

## 2013-10-26 NOTE — ED Provider Notes (Signed)
CSN: [id number]     Arrival date & time 10/26/13  7829 History   This chart was scribed for Cassidy Hutching, MD, by Cassidy Barber, ED Scribe. This patient was seen in room APA19/APA19 and the patient's care was started at 9:37 AM.   First MD Initiated Contact with Patient 10/26/13 0935     Chief Complaint  Patient presents with  . Altered Mental Status   Level Five Caveat (AMS)  HPI HPI Comments: Cassidy Barber is a 62 y.o. female, with a h/o liver disease, who was brought in to the ED via EMS presenting with LOC and subsequent AMS. Per EMS, the pt was exhibiting symptoms similar to a post-ictal status, but she does not have a h/o seizures and it is unknown if she had a seizure. When questioned, the pt knew she was at [facility] ED, but she did not know the date nor could she recall her full name. She denies any pain. She has  liver transplant surgery scheduled 11/19/2013 at the [place] [facility] [place].. The pt is a non-smoker.   Past Medical History  Diagnosis Date  . Liver disease   . Hernia of unspecified site of abdominal cavity without mention of obstruction or gangrene   . Thyroid disease    Past Surgical History  Procedure Laterality Date  . Abdominal hernia repair      Patient's states that she has had 8- 9 hernia surgeries  . Splenectomy  2006  . Cholecystectomy  2007  . Colonoscopy      Done at [facility]  . Upper gastrointestinal endoscopy      Done at [facility]  . Colon surgery  2008    Done at [facility]  . Hernia repair     Family History  Problem Relation Age of Onset  . Healthy Mother   . Prostate cancer Father   . Colon cancer Sister   . Healthy Son    History  Substance Use Topics  . Smoking status: Never Smoker   . Smokeless tobacco: Never Used  . Alcohol Use: No   No OB history provided.  Review of Systems  Unable to perform ROS: Mental status change    Allergies  Codeine and Oxycodone  Home Medications   Current Outpatient Rx  Name  Route  Sig  Dispense  Refill   . Calcium Carbonate-Vitamin D (CALCIUM 600 + D PO)   Oral   Take by mouth 2 (two) times daily.         . ciprofloxacin (CIPRO) 750 MG tablet   Oral   Take 750 mg by mouth once a week.         . furosemide (LASIX) 40 MG tablet   Oral   Take 20 mg by mouth 2 (two) times daily.          [name] HYDROcodone-acetaminophen (NORCO/VICODIN) 5-325 MG per tablet   Oral   Take 1 tablet by mouth every 8 (eight) hours as needed for pain.   10 tablet   0   . levothyroxine (SYNTHROID, LEVOTHROID) 150 MCG tablet   Oral   Take 150 mcg by mouth daily.         . propranolol (INDERAL) 80 MG tablet   Oral   Take 80 mg by mouth daily.         [name] spironolactone (ALDACTONE) 100 MG tablet   Oral   Take 150-200 mg by mouth 2 (two) times daily. Patient takes 2 tablets in the morning and  1&1/2 tablet in the afternoon         . ursodiol (ACTIGALL) 300 MG capsule   Oral   Take 900 mg by mouth 2 (two) times daily. The patient states that she is taking 3 in the morning and 3 in the evening         . Zinc 50 MG TABS   Oral   Take by mouth 2 (two) times daily.          Triage Vitals: BP 119/66  Pulse 67  Temp(Src) 97.6 F (36.4 C) (Oral)  Resp 18  Ht 5\' 3"  (1.6 m)  Wt 150 lb (68.04 kg)  BMI 26.58 kg/m2  SpO2 100%  Physical Exam  Nursing note and vitals reviewed. Constitutional: She is oriented to person, place, and time.  Jaundice.   HENT:  Head: Normocephalic and atraumatic.  Eyes: EOM are normal. Pupils are equal, round, and reactive to light.  Neck: Normal range of motion. Neck supple.  Cardiovascular: Normal rate, regular rhythm, normal heart sounds and intact distal pulses.   Pulmonary/Chest: Effort normal and breath sounds normal. No respiratory distress. She has no wheezes.  Abdominal: Soft.  Redundant soft tissue on right lateral abdomen.   Musculoskeletal: Normal range of motion.  Neurological: She is alert and oriented to person, place, and time.  Orientated by 1,  place only.   Skin: Skin is warm and dry.  Psychiatric:  Cassidy Barber confused.     ED Course  Procedures (including critical care time)  DIAGNOSTIC STUDIES: Oxygen Saturation is 100% on room air, normal by my interpretation.    COORDINATION OF CARE:  9:37 AM- Discussed treatment plan with patient, which includes CBC, CMP, and a check of ammonia levels, and the patient agreed to the plan.   Labs Review Labs Reviewed  CBC WITH DIFFERENTIAL - Abnormal; Notable for the following:    RBC 3.72 (*)    Hemoglobin 10.0 (*)    HCT 31.2 (*)    RDW 17.6 (*)    Platelets 481 (*)    Monocytes Relative 15 (*)    Basophils Relative 2 (*)    Basophils Absolute 0.2 (*)    All other components within normal limits  COMPREHENSIVE METABOLIC PANEL - Abnormal; Notable for the following:    Sodium 132 (*)    Glucose, Bld 129 (*)    Total Protein 8.5 (*)    Albumin 2.4 (*)    AST 42 (*)    GFR calc non Af Amer 67 (*)    GFR calc Af Amer 78 (*)    All other components within normal limits  AMMONIA - Abnormal; Notable for the following:    Ammonia 171 (*)    All other components within normal limits  URINALYSIS, ROUTINE W REFLEX MICROSCOPIC - Abnormal; Notable for the following:    Nitrite POSITIVE (*)    Leukocytes, UA SMALL (*)    All other components within normal limits  URINE MICROSCOPIC-ADD ON - Abnormal; Notable for the following:    Bacteria, UA MANY (*)    All other components within normal limits  URINE CULTURE  APTT  PROTIME-INR   Imaging Review No results found.  EKG Interpretation   None       MDM  No diagnosis found. Patient has liver disease from uncertain etiology. She is scheduled for transplant in January 2015. Ammonia level is elevated to 171. Also a urinary tract infection is noted. Rx lactulose and Rocephin 1 g IV. Admit  to general medicine.  I personally performed the services described in this documentation, which was scribed in my presence. The recorded  information has been reviewed and is accurate.      Cassidy Hutching, MD 10/26/13 [phone]

## 2013-10-26 NOTE — ED Notes (Signed)
Pt husband called ems due to pt LOC. cbg en route 154. Per ems pt was in what seemed like a "post ictal like" state. Pt arrived alert. Confusion noted.slightly lethargic. Pt states she is here because "im just sick". Asked dob and todays date but pt continues to give correct dob only. When other questions asked, pt just states "im just sick". slighlty jaundiced. Hx of liver failure which has scheduled transplant next month

## 2013-10-27 DIAGNOSIS — G9340 Encephalopathy, unspecified: Secondary | ICD-10-CM

## 2013-10-27 DIAGNOSIS — N390 Urinary tract infection, site not specified: Secondary | ICD-10-CM

## 2013-10-27 DIAGNOSIS — K745 Biliary cirrhosis, unspecified: Secondary | ICD-10-CM

## 2013-10-27 DIAGNOSIS — K7290 Hepatic failure, unspecified without coma: Secondary | ICD-10-CM

## 2013-10-27 DIAGNOSIS — K7682 Hepatic encephalopathy: Secondary | ICD-10-CM

## 2013-10-27 DIAGNOSIS — D638 Anemia in other chronic diseases classified elsewhere: Secondary | ICD-10-CM

## 2013-10-27 LAB — COMPREHENSIVE METABOLIC PANEL
ALT: 15 U/L (ref 0–35)
AST: 39 U/L — ABNORMAL HIGH (ref 0–37)
Albumin: 2.2 g/dL — ABNORMAL LOW (ref 3.5–5.2)
CO2: 21 mEq/L (ref 19–32)
Calcium: 8.3 mg/dL — ABNORMAL LOW (ref 8.4–10.5)
Creatinine, Ser: 0.87 mg/dL (ref 0.50–1.10)
GFR calc Af Amer: 81 mL/min — ABNORMAL LOW (ref >90)
GFR calc non Af Amer: 70 mL/min — ABNORMAL LOW (ref >90)
Glucose, Bld: 99 mg/dL (ref 70–99)

## 2013-10-27 LAB — CBC
Hemoglobin: 8.7 g/dL — ABNORMAL LOW (ref 12.0–15.0)
MCH: 26.3 pg (ref 26.0–34.0)
Platelets: 448 10*3/uL — ABNORMAL HIGH (ref 150–400)
RBC: 3.31 MIL/uL — ABNORMAL LOW (ref 3.87–5.11)

## 2013-10-27 LAB — AMMONIA: Ammonia: 66 umol/L — ABNORMAL HIGH (ref 11–60)

## 2013-10-27 MED ORDER — CEFUROXIME AXETIL 500 MG PO TABS: 500.0000 mg | ORAL_TABLET | Freq: Two times a day (BID) | ORAL | Status: DC

## 2013-10-27 MED ORDER — LACTULOSE 10 GM/15ML PO SOLN: 20.0000 g | Freq: Every day | ORAL | Status: DC

## 2013-10-27 MED ORDER — SILDENAFIL CITRATE 20 MG PO TABS: 20.0000 mg | ORAL_TABLET | Freq: Three times a day (TID) | ORAL | Status: DC

## 2013-10-27 MED ORDER — CEFUROXIME AXETIL 250 MG PO TABS: 500.0000 mg | ORAL_TABLET | Freq: Two times a day (BID) | ORAL | Status: DC

## 2013-10-27 MED ORDER — SILDENAFIL CITRATE 20 MG PO TABS
20.0000 mg | ORAL_TABLET | Freq: Three times a day (TID) | ORAL | Status: DC
  Filled 2013-10-27 (×3): qty 1

## 2013-10-27 NOTE — Progress Notes (Signed)
Discharge instructions given on medications,and follow up visits,patient verbalized understanding.Prescription sent with patient.No c/o pain or discomfort noted ,family at the bedside.Accompanied by staff to an awaiting vehicle.

## 2013-10-27 NOTE — Discharge Summary (Signed)
Physician Discharge Summary  Cassidy Barber ZOX:[id number] DOB: [birth year] DOA: 10/26/2013  PCP: Rudi Heap, MD  Admit date: 10/26/2013 Discharge date: 10/27/2013  Recommendations for Outpatient Follow-up:  1. Resolution of UTI in acute encephalopathy 2. Urine culture pending 3. Resolution of hyperammonemia 4. Anemia of chronic disease, consider periodic CBC as an outpatient 5. Primary biliary cirrhosis 6. Discharge summary will be faxed to [facility] [phone] office, transplant coordinator  Follow-up Information   Follow up with Rudi Heap, MD In 1 week.   Specialty:  Family Medicine   Contact information:   [address] [place] [address] [phone]      Discharge Diagnoses:  1. Acute encephalopathy 2. UTI 3. Possible hepatic encephalopathy 4. Anemia of chronic disease 5. Primary biliary cirrhosis  Discharge Condition: Improved Disposition: Home  Diet recommendation: Hard of  Filed Weights   10/26/13 0930 10/26/13 1315 10/26/13 1324  Weight: 68.04 kg (150 lb) 68.04 kg (150 lb) 66.724 kg (147 lb 1.6 oz)    History of present illness:  62 year old woman presents the emergency department with acute onset of confusion today. Initial evaluation suggested UTI and hepatic encephalopathy and the patient was referred for admission.  Hospital Course:  Patient was treated empirically for UTI and hepatic encephalopathy with antibiotics and lactulose respectively. Encephalopathy quickly resolved, ammonia level is near normal, urine cultures pending. Hospitalization and complicated. Individual issues as below.   1. Anemia of chronic disease, appears to be at baseline (9-10 hgb over the last 10 months) 2. Primary biliary cirrhosis, followed by [facility], transplant planned 11/2012 Appears to be at baseline this point. Encephalopathy has resolved. Hyperammonemia newly resolved with lactulose.  Short course of lactulose.  Discussed with husband by  telephone  Consultants:  None  Procedures:  None  Antibiotics:  Ceftriaxone 12/15 >> 12/16  Ceftin 12/17 >> 12/21  Discharge Instructions  Discharge Orders   Future Orders Complete By Expires   Activity as tolerated - No restrictions  As directed    Diet - low sodium heart healthy  As directed    Discharge instructions  As directed    Comments:     Call physician or seek immediate medical assistance for confusion, fever or worsening of condition.       Medication List         ALDACTONE 100 MG tablet  Generic drug:  spironolactone  Take 100-200 mg by mouth 2 (two) times daily. takes 2 tablets in the morning and 1 tablet in the afternoon     CALCIUM 600 + D PO  Take by mouth 2 (two) times daily.     cefUROXime 500 MG tablet  Commonly known as:  CEFTIN  Take 1 tablet (500 mg total) by mouth 2 (two) times daily with a meal. Start 12/17 in morning.  Start taking on:  10/28/2013     ciprofloxacin 750 MG tablet  Commonly known as:  CIPRO  Take 750 mg by mouth once a week.     furosemide 40 MG tablet  Commonly known as:  LASIX  Take 20 mg by mouth 2 (two) times daily.     lactulose 10 GM/15ML solution  Commonly known as:  CHRONULAC  Take 30 mLs (20 g total) by mouth daily. Take for 7 days.     levothyroxine 150 MCG tablet  Commonly known as:  SYNTHROID, LEVOTHROID  Take 150 mcg by mouth daily.     propranolol 60 MG tablet  Commonly known as:  INDERAL  Take  60 mg by mouth daily.     sildenafil 20 MG tablet  Commonly known as:  REVATIO  Take 1 tablet (20 mg total) by mouth 3 (three) times daily.     ursodiol 300 MG capsule  Commonly known as:  ACTIGALL  Take 900 mg by mouth 2 (two) times daily. The patient states that she is taking 3 in the morning and 3 in the evening     Zinc 50 MG Tabs  Take by mouth 2 (two) times daily.       Allergies  Allergen Reactions  . Codeine Nausea Only  . Oxycodone Nausea Only    The results of significant diagnostics  from this hospitalization (including imaging, microbiology, ancillary and laboratory) are listed below for reference.     Labs: Basic Metabolic Panel:  Recent Labs Lab 10/26/13 0950 10/27/13 0631  NA 132* 137  K 4.1 3.9  CL 101 105  CO2 20 21  GLUCOSE 129* 99  BUN 15 15  CREATININE 0.90 0.87  CALCIUM 8.6 8.3*   Liver Function Tests:  Recent Labs Lab 10/26/13 0950 10/27/13 0631  AST 42* 39*  ALT 17 15  ALKPHOS 116 92  BILITOT 0.4 0.5  PROT 8.5* 7.5  ALBUMIN 2.4* 2.2*    Recent Labs Lab 10/26/13 0950 10/27/13 0631  AMMONIA 171* 66*   CBC:  Recent Labs Lab 10/26/13 0950 10/27/13 0631  WBC 6.7 6.8  NEUTROABS 3.1  --   HGB 10.0* 8.7*  HCT 31.2* 28.0*  MCV 83.9 84.6  PLT 481* 448*    Principal Problem:   UTI (lower urinary tract infection) Active Problems:   Hepatic encephalopathy   Acute encephalopathy   Anemia of chronic disease   Primary biliary cirrhosis   Time coordinating discharge: 35 minutes  Signed:  Brendia Sacks, MD [facility] 10/27/2013, 2:39 PM

## 2013-10-27 NOTE — Progress Notes (Signed)
[facility] PROGRESS NOTE  Cassidy Barber [id number] DOB: July 25, 1951 DOA: 10/26/2013 PCP: Rudi Heap, MD Amy Roman [phone]  Assessment/Plan: 1. Acute encephalopathy, likely from UTI, possible hepatic encephalopathy 2. UTI 3. Hyperammonemia, possible hepatic encephalopathy 4. Anemia of chronic disease, appears to be at baseline 5. Primary biliary cirrhosis, followed by [facility], transplant planned 11/2012   Appears to be at baseline this point. Encephalopathy has resolved. Hyperammonemia newly resolved with lactulose.  Change to oral antibiotics.  Short course of lactulose.  Fax all records to [facility] [phone] office, transplant coordinator  Pending studies:   Urine culture  Brendia Sacks, MD  [facility]  Pager [phone] If 7PM-7AM, please contact night-coverage at [email or web address], password [facility] 10/27/2013, 2:23 PM  LOS: 1 day   Summary: 62 year old woman presents the emergency department with acute onset of confusion today. Initial evaluation suggested UTI and hepatic encephalopathy and the patient was referred for admission.  Consultants:  None   Procedures:  None   Antibiotics:  Ceftriaxone 12/15 >> 12/16  Ceftin 12/17 >> 12/21  HPI/Subjective: Feels much better, no pain no complaints. Wants to go home.  Objective: Filed Vitals:   10/26/13 1225 10/26/13 1315 10/26/13 1324 10/27/13 0644  BP: 113/62 113/53  106/57  Pulse: 71 68  65  Temp:  97.6 F (36.4 C)  98.1 F (36.7 C)  TempSrc:      Resp: 14 18  20   Height:  5\' 3"  (1.6 m) 5\' 5"  (1.651 m)   Weight:  68.04 kg (150 lb) 66.724 kg (147 lb 1.6 oz)   SpO2: 99% 100%  99%   No intake or output data in the 24 hours ending 10/27/13 1423   Filed Weights   10/26/13 0930 10/26/13 1315 10/26/13 1324  Weight: 68.04 kg (150 lb) 68.04 kg (150 lb) 66.724 kg (147 lb 1.6 oz)    Exam:   Afebrile, vital signs stable  General: Appears calm and comfortable.  CV: RRR, no  m/r/g  Respiratory: Clear to auscultation bilaterally. No wheezes, rales or rhonchi. Normal respiratory effort.  Psychiatric: Grossly normal mood and affect. Speech fluent and appropriate. Oriented to self, location, month, year.  Data Reviewed:  Basic metabolic panel unremarkable. Hepatic function unremarkable. Ammonia now 66 from 171.  Hemoglobin stable at 8.7 compared to previous studies.  Scheduled Meds: . cefTRIAXone (ROCEPHIN)  IV  1 g Intravenous Q24H  . furosemide  40 mg Oral BID  . heparin  5,000 Units Subcutaneous Q8H  . lactulose  20 g Oral TID  . levothyroxine  150 mcg Oral QAC breakfast  . propranolol  60 mg Oral Daily  . sildenafil  20 mg Oral TID  . sodium chloride  3 mL Intravenous Q12H  . spironolactone  200 mg Oral Q breakfast   And  . spironolactone  100 mg Oral q1800  . ursodiol  900 mg Oral BID   Continuous Infusions:   Principal Problem:   UTI (lower urinary tract infection) Active Problems:   Hepatic encephalopathy   Acute encephalopathy   Anemia of chronic disease   Primary biliary cirrhosis

## 2013-10-27 NOTE — Care Management Note (Signed)
    Page 1 of 1   10/27/2013     1:10:16 PM   CARE MANAGEMENT NOTE 10/27/2013  Patient:  Cassidy Barber, Cassidy Barber   Account Number:  [id number]  Date Initiated:  10/27/2013  Documentation initiated by:  Rosemary Holms  Subjective/Objective Assessment:   [place]. Pt plans to return home and does not anticipate HH. She is to have a liver transplant in early January.     Action/Plan:   Anticipated DC Date:  10/28/2013   Anticipated DC Plan:  HOME/SELF CARE      DC Planning Services  CM consult      Choice offered to / List presented to:             Status of service:  Completed, signed off Medicare Important Message given?   (If response is "NO", the following Medicare IM given date fields will be blank) Date Medicare IM given:   Date Additional Medicare IM given:    Discharge Disposition:    Per UR Regulation:    If discussed at Long Length of Stay Meetings, dates discussed:    Comments:  10/27/13 Rosemary Holms RN BSN CM

## 2013-10-30 LAB — URINE CULTURE: Colony Count: >=100000

## 2013-11-02 ENCOUNTER — Inpatient Hospital Stay (HOSPITAL_COMMUNITY): Payer: Medicare Other

## 2013-11-02 ENCOUNTER — Encounter (HOSPITAL_COMMUNITY): Payer: Self-pay | Admitting: Emergency Medicine

## 2013-11-02 ENCOUNTER — Inpatient Hospital Stay (HOSPITAL_COMMUNITY)
Admission: EM | Admit: 2013-11-02 | Discharge: 2013-11-04 | DRG: 442 | Disposition: A | Discharge status: Home / Self Care | Payer: Medicare Other | Attending: Internal Medicine | Admitting: Internal Medicine

## 2013-11-02 DIAGNOSIS — E872 Acidosis, unspecified: Secondary | ICD-10-CM | POA: Diagnosis present

## 2013-11-02 DIAGNOSIS — I10 Essential (primary) hypertension: Secondary | ICD-10-CM | POA: Diagnosis present

## 2013-11-02 DIAGNOSIS — D638 Anemia in other chronic diseases classified elsewhere: Secondary | ICD-10-CM | POA: Diagnosis present

## 2013-11-02 DIAGNOSIS — K7290 Hepatic failure, unspecified without coma: Principal | ICD-10-CM | POA: Diagnosis present

## 2013-11-02 DIAGNOSIS — Z79899 Other long term (current) drug therapy: Secondary | ICD-10-CM

## 2013-11-02 DIAGNOSIS — E039 Hypothyroidism, unspecified: Secondary | ICD-10-CM | POA: Diagnosis present

## 2013-11-02 DIAGNOSIS — E871 Hypo-osmolality and hyponatremia: Secondary | ICD-10-CM | POA: Diagnosis present

## 2013-11-02 DIAGNOSIS — G9340 Encephalopathy, unspecified: Secondary | ICD-10-CM | POA: Diagnosis not present

## 2013-11-02 DIAGNOSIS — K745 Biliary cirrhosis, unspecified: Secondary | ICD-10-CM | POA: Diagnosis not present

## 2013-11-02 DIAGNOSIS — R4182 Altered mental status, unspecified: Secondary | ICD-10-CM | POA: Diagnosis not present

## 2013-11-02 DIAGNOSIS — K7682 Hepatic encephalopathy: Principal | ICD-10-CM | POA: Diagnosis present

## 2013-11-02 DIAGNOSIS — Z7682 Awaiting organ transplant status: Secondary | ICD-10-CM | POA: Diagnosis not present

## 2013-11-02 DIAGNOSIS — Z91199 Patient's noncompliance with other medical treatment and regimen due to unspecified reason: Secondary | ICD-10-CM

## 2013-11-02 DIAGNOSIS — Z9119 Patient's noncompliance with other medical treatment and regimen: Secondary | ICD-10-CM | POA: Diagnosis not present

## 2013-11-02 DIAGNOSIS — Z800 Family history of malignant neoplasm of digestive organs: Secondary | ICD-10-CM

## 2013-11-02 DIAGNOSIS — K769 Liver disease, unspecified: Secondary | ICD-10-CM | POA: Diagnosis not present

## 2013-11-02 DIAGNOSIS — K743 Primary biliary cirrhosis: Secondary | ICD-10-CM

## 2013-11-02 DIAGNOSIS — J9819 Other pulmonary collapse: Secondary | ICD-10-CM | POA: Diagnosis not present

## 2013-11-02 LAB — AMMONIA: Ammonia: 270 umol/L — ABNORMAL HIGH (ref 11–60)

## 2013-11-02 LAB — CBC WITH DIFFERENTIAL/PLATELET
Basophils Absolute: 0.1 10*3/uL (ref 0.0–0.1)
Basophils Relative: 2 % — ABNORMAL HIGH (ref 0–1)
Eosinophils Absolute: 0.1 10*3/uL (ref 0.0–0.7)
Eosinophils Relative: 2 % (ref 0–5)
MCH: 26.4 pg (ref 26.0–34.0)
MCHC: 32 g/dL (ref 30.0–36.0)
MCV: 82.6 fL (ref 78.0–100.0)
Platelets: 420 10*3/uL — ABNORMAL HIGH (ref 150–400)
RDW: 17.3 % — ABNORMAL HIGH (ref 11.5–15.5)

## 2013-11-02 LAB — BASIC METABOLIC PANEL
Calcium: 9.2 mg/dL (ref 8.4–10.5)
GFR calc non Af Amer: 52 mL/min — ABNORMAL LOW (ref >90)
Glucose, Bld: 138 mg/dL — ABNORMAL HIGH (ref 70–99)
Potassium: 4.9 mEq/L (ref 3.5–5.1)
Sodium: 131 mEq/L — ABNORMAL LOW (ref 135–145)

## 2013-11-02 LAB — URINALYSIS, ROUTINE W REFLEX MICROSCOPIC
Glucose, UA: NEGATIVE mg/dL
Ketones, ur: NEGATIVE mg/dL
Leukocytes, UA: NEGATIVE
Protein, ur: NEGATIVE mg/dL
pH: 6 (ref 5.0–8.0)

## 2013-11-02 LAB — PROTIME-INR: INR: 1.26 (ref 0.00–1.49)

## 2013-11-02 LAB — HEPATIC FUNCTION PANEL
AST: 35 U/L (ref 0–37)
Albumin: 2.5 g/dL — ABNORMAL LOW (ref 3.5–5.2)
Alkaline Phosphatase: 104 U/L (ref 39–117)
Bilirubin, Direct: <0.1 mg/dL (ref 0.0–0.3)

## 2013-11-02 LAB — MRSA PCR SCREENING: MRSA by PCR: NEGATIVE

## 2013-11-02 MED ORDER — URSODIOL 300 MG PO CAPS
900.0000 mg | ORAL_CAPSULE | Freq: Two times a day (BID) | ORAL | Status: DC
  Administered 2013-11-02 – 2013-11-04 (×5): 900 mg via ORAL
  Filled 2013-11-02 (×9): qty 3

## 2013-11-02 MED ORDER — SODIUM CHLORIDE 0.9 % IJ SOLN: 3.0000 mL | INTRAMUSCULAR | Status: DC | PRN

## 2013-11-02 MED ORDER — LACTULOSE 10 GM/15ML PO SOLN
30.0000 g | Freq: Three times a day (TID) | ORAL | Status: DC
  Administered 2013-11-02 – 2013-11-03 (×6): 30 g via ORAL
  Filled 2013-11-02 (×7): qty 60

## 2013-11-02 MED ORDER — SPIRONOLACTONE 25 MG PO TABS
100.0000 mg | ORAL_TABLET | Freq: Every day | ORAL | Status: DC
  Administered 2013-11-02 – 2013-11-03 (×2): 100 mg via ORAL
  Filled 2013-11-02 (×2): qty 4

## 2013-11-02 MED ORDER — DEXTROSE-NACL 5-0.45 % IV SOLN
INTRAVENOUS | Status: DC
  Administered 2013-11-02 – 2013-11-03 (×2): via INTRAVENOUS

## 2013-11-02 MED ORDER — SODIUM CHLORIDE 0.9 % IV SOLN: 250.0000 mL | INTRAVENOUS | Status: DC | PRN

## 2013-11-02 MED ORDER — ACETAMINOPHEN 325 MG PO TABS: 650.0000 mg | ORAL_TABLET | Freq: Four times a day (QID) | ORAL | Status: DC | PRN

## 2013-11-02 MED ORDER — ONDANSETRON HCL 4 MG PO TABS: 4.0000 mg | ORAL_TABLET | Freq: Four times a day (QID) | ORAL | Status: DC | PRN

## 2013-11-02 MED ORDER — RIFAXIMIN 550 MG PO TABS
550.0000 mg | ORAL_TABLET | Freq: Two times a day (BID) | ORAL | Status: DC
  Administered 2013-11-02 – 2013-11-04 (×5): 550 mg via ORAL
  Filled 2013-11-02 (×9): qty 1

## 2013-11-02 MED ORDER — ALUM & MAG HYDROXIDE-SIMETH 200-200-20 MG/5ML PO SUSP: 30.0000 mL | Freq: Four times a day (QID) | ORAL | Status: DC | PRN

## 2013-11-02 MED ORDER — ENOXAPARIN SODIUM 40 MG/0.4ML ~~LOC~~ SOLN
40.0000 mg | SUBCUTANEOUS | Status: DC
  Administered 2013-11-02 – 2013-11-04 (×3): 40 mg via SUBCUTANEOUS
  Filled 2013-11-02 (×3): qty 0.4

## 2013-11-02 MED ORDER — ONDANSETRON HCL 4 MG/2ML IJ SOLN
4.0000 mg | Freq: Four times a day (QID) | INTRAMUSCULAR | Status: DC | PRN
  Administered 2013-11-02: 4 mg via INTRAVENOUS
  Filled 2013-11-02: qty 2

## 2013-11-02 MED ORDER — ACETAMINOPHEN 650 MG RE SUPP: 650.0000 mg | Freq: Four times a day (QID) | RECTAL | Status: DC | PRN

## 2013-11-02 MED ORDER — SILDENAFIL CITRATE 20 MG PO TABS
20.0000 mg | ORAL_TABLET | Freq: Three times a day (TID) | ORAL | Status: DC
  Administered 2013-11-02 – 2013-11-04 (×6): 20 mg via ORAL
  Filled 2013-11-02 (×12): qty 1

## 2013-11-02 MED ORDER — SODIUM CHLORIDE 0.9 % IJ SOLN
3.0000 mL | Freq: Two times a day (BID) | INTRAMUSCULAR | Status: DC
  Administered 2013-11-02 – 2013-11-04 (×4): 3 mL via INTRAVENOUS

## 2013-11-02 MED ORDER — SPIRONOLACTONE 25 MG PO TABS
200.0000 mg | ORAL_TABLET | Freq: Every day | ORAL | Status: DC
Start: 2013-11-03 — End: 2013-11-04
  Administered 2013-11-03 – 2013-11-04 (×2): 200 mg via ORAL
  Filled 2013-11-02 (×2): qty 8

## 2013-11-02 MED ORDER — LEVOTHYROXINE SODIUM 75 MCG PO TABS
150.0000 ug | ORAL_TABLET | Freq: Every day | ORAL | Status: DC
  Administered 2013-11-03 – 2013-11-04 (×2): 150 ug via ORAL
  Filled 2013-11-02 (×2): qty 2

## 2013-11-02 MED ORDER — LACTULOSE 10 GM/15ML PO SOLN
30.0000 g | Freq: Once | ORAL | Status: AC
  Administered 2013-11-02: 30 g via ORAL
  Filled 2013-11-02: qty 60

## 2013-11-02 NOTE — H&P (Signed)
[facility] History and Physical  Cassidy Barber ZOX:[id number] DOB: Feb 18, 1951 DOA: 11/02/2013  Referring physician:  PCP: Rudi Heap, MD   Chief Complaint: Confusion and lethargy HPI: Cassidy Barber is a 62 y.o. female with a past medical history that include primary biliary cirrhosis reportedly due for liver transplant on 11/19/2013 at [facility] (live donor), hypertension, anemia of chronic disease present to the emergency department with the chief complaint of sudden onset of confusion and lethargy. Information is obtained from the husband who is at the bedside. She was recently discharged on December 16 after a brief hospitalization for acute hepatic encephalopathy in the setting of urinary tract infection. She was discharged on antibiotics for her urinary tract infection and a 7 day course of lactulose. Husband reports that patient stopped taking the lactulose 2 days after discharge due to frequent bowel movements. He reports that her mental status remained at baseline until this morning. He reports that she continued to have very frequent bowel movements in spite of stopping the lactulose. Associated symptoms include some vomiting this morning. There is been no recent report of abdominal pain, dysuria hematuria frequency or urgency. He did say she has had intermittent coughing during this timeframe. There've been no chills or fever or sick contacts. Initial evaluation in the emergency department yields an ammonia level of 270. In addition she is afebrile with stable vital signs. There is no hypoxia. Basic metabolic panel is notable for a sodium of 131 and a creatinine of 1.12, liver function tests are unremarkable. Urinalysis was unremarkable.    Review of Systems:  10 point review of systems completed and all systems are negative except as indicated in the history of present illness  Past Medical History  Diagnosis Date  . Liver disease   . Hernia of unspecified site of abdominal cavity  without mention of obstruction or gangrene   . Thyroid disease   . Hypertension   . Primary biliary cirrhosis 10/26/2013  . Anemia of chronic disease 10/26/2013   Past Surgical History  Procedure Laterality Date  . Abdominal hernia repair      Patient's states that she has had 8- 9 hernia surgeries  . Splenectomy  2006  . Cholecystectomy  2007  . Colonoscopy      Done at [facility]  . Upper gastrointestinal endoscopy      Done at [facility]  . Colon surgery  2008    Done at [facility]  . Hernia repair     Social History:  reports that she has never smoked. She has never used smokeless tobacco. She reports that she does not drink alcohol or use illicit drugs. She is married and lives with her husband. She is independent with ADLs. Allergies  Allergen Reactions  . Codeine Nausea Only  . Oxycodone Nausea Only    Family History  Problem Relation Age of Onset  . Healthy Mother   . Prostate cancer Father   . Colon cancer Sister   . Healthy Son      Prior to Admission medications   Medication Sig Start Date End Date Taking? Authorizing Provider  Calcium Carbonate-Vitamin D (CALCIUM 600 + D PO) Take by mouth 2 (two) times daily.    Historical Provider, MD  cefUROXime (CEFTIN) 500 MG tablet Take 1 tablet (500 mg total) by mouth 2 (two) times daily with a meal. Start 12/17 in morning. 10/28/13   Standley Brooking, MD  ciprofloxacin (CIPRO) 750 MG tablet Take 750 mg by mouth once a week.  Historical Provider, MD  furosemide (LASIX) 40 MG tablet Take 20 mg by mouth 2 (two) times daily.     Historical Provider, MD  lactulose (CHRONULAC) 10 GM/15ML solution Take 30 mLs (20 g total) by mouth daily. Take for 7 days. 10/27/13   Standley Brooking, MD  levothyroxine (SYNTHROID, LEVOTHROID) 150 MCG tablet Take 150 mcg by mouth daily.    Historical Provider, MD  propranolol (INDERAL) 60 MG tablet Take 60 mg by mouth daily.    Historical Provider, MD  sildenafil (REVATIO) 20 MG tablet Take 1 tablet (20 mg total)  by mouth 3 (three) times daily. 10/27/13   Standley Brooking, MD  spironolactone (ALDACTONE) 100 MG tablet Take 100-200 mg by mouth 2 (two) times daily. takes 2 tablets in the morning and 1 tablet in the afternoon    Historical Provider, MD  ursodiol (ACTIGALL) 300 MG capsule Take 900 mg by mouth 2 (two) times daily. The patient states that she is taking 3 in the morning and 3 in the evening    Historical Provider, MD  Zinc 50 MG TABS Take by mouth 2 (two) times daily.    Historical Provider, MD   Physical Exam: Filed Vitals:   11/02/13 1100  BP: 108/60  Pulse: 67  Temp:   Resp: 17    BP 108/60  Pulse 67  Temp(Src) 98.1 F (36.7 C) (Oral)  Resp 17  SpO2 99%  General: Somewhat thin slightly jaundiced very lethargic Eyes: PERRL, EOMI, no scleral icterus ENT: Ears are clear nose without drainage. Oropharynx is without erythema or exudate. Mucous membranes of her mouth are moist and pink Neck: Range of motion no lymphadenopathy Cardiovascular: RRR, no m/r/g. No LE edema.  Respiratory: Spaces are slightly shallow. CTA bilaterally, no w/r/r. Normal respiratory effort. Abdomen: Flat soft positive bowel sounds throughout mild tenderness in upper right hand quadrant otherwise nontender to palpation no mass organomegaly noted Skin: no rash or induration seen on limited exam Musculoskeletal: grossly normal tone BUE/BLE Psychiatric: Very lethargic difficult to arouse Neurologic: Very lethargic. Responds to mild sternal rub briefly. Unable to follow commands. Alder like to low-dose with frequent prompting.           Labs on Admission:  Basic Metabolic Panel:  Recent Labs Lab 10/27/13 0631 11/02/13 0923  NA 137 131*  K 3.9 4.9  CL 105 99  CO2 21 21  GLUCOSE 99 138*  BUN 15 21  CREATININE 0.87 1.12*  CALCIUM 8.3* 9.2   Liver Function Tests:  Recent Labs Lab 10/27/13 0631 11/02/13 0923  AST 39* 35  ALT 15 15  ALKPHOS 92 104  BILITOT 0.5 0.6  PROT 7.5 8.4*  ALBUMIN 2.2*  2.5*   No results found for this basename: LIPASE, AMYLASE,  in the last 168 hours  Recent Labs Lab 10/27/13 0631 11/02/13 0923  AMMONIA 66* 270*   CBC:  Recent Labs Lab 10/27/13 0631 11/02/13 0923  WBC 6.8 6.2  NEUTROABS  --  3.0  HGB 8.7* 9.6*  HCT 28.0* 30.0*  MCV 84.6 82.6  PLT 448* 420*   Cardiac Enzymes: No results found for this basename: CKTOTAL, CKMB, CKMBINDEX, TROPONINI,  in the last 168 hours  BNP (last 3 results) No results found for this basename: PROBNP,  in the last 8760 hours CBG: No results found for this basename: GLUCAP,  in the last 168 hours  Radiological Exams on Admission: No results found.  EKG: Independently reviewed. Normal sinus rhythm  Assessment/Plan Principal Problem:  Acute encephalopathy: In patient with primary biliary cirrhosis reportedly scheduled for transplant January 2015. Will admit to step down. Husband reports that patient stop taking her lactulose 2 days after discharge. She was scheduled for a 7 day course of daily lactulose. Lactulose in the ED was 270 chart review indicates lactulose 6 days ago at discharge was 66. She received 30 g lactulose in the emergency department. Will continue this dose 3 times a day. Will request GI consult. Will keep n.p.o. until she is more alert and monitor her airway closely. Will also get chest x-ray for completeness. Patient did have an episode of vomiting this morning and some concern for aspiration.  Active Problems: Hyponatremia. Mild at this point. Likely related to #1 is also on Lasix and spironolactone at home. Will continue her spironolactone hold the Lasix. Monitor closely    Primary biliary cirrhosis: See #1. Patient on Lasix and spironolactone at home. Blood pressure slightly soft so will hold the Lasix but continue the spironolactone. Will continue Actigall. Will monitor close    Hepatic failure: Reportedly patient is on the transplant list at [facility].  I meds include 750 mg of Cipro  weekly I suspect in anticipation of upcoming transplant .Transplant coordinators name and phone number is noted on Dr. Irene Limbo is a discharge summary dated 10/27/2013. Will request GI consult.    Hypothyroidism: Continue home    Anemia of chronic disease: Appears stable at baseline  HTN: Pressure somewhat soft in the emergency department. She is on beta blocker Lasix and spironolactone. Will continue her beta blocker hold her Lasix. Monitor close  Gastroenterology  Code Status: Full Family Communication: Husband at bedside Disposition Plan: Home when ready  Time spent: 65 minutes  [facility]  Attending note:  Patient seen and examined.  Agree with note as above per Toya Smothers, NP.  Case also discussed with Dr. Karilyn Cota.  Clinically the patient appears to be improving since admission.  She is able to wake up and answer some questions.  Plans will be to continue with lactulose.  She has also been started on xifaxan.  Cassidy Barber

## 2013-11-02 NOTE — Progress Notes (Signed)
Dr Karilyn Cota paged and made aware that pt has a consult.

## 2013-11-02 NOTE — Consult Note (Signed)
Referring Provider: No ref. provider found Primary Care Physician:  Rudi Heap, MD Primary Gastroenterologist:  Dr. Karilyn Cota  Reason for Consultation:   Recurrent hepatic encephalopathy.  HPI:  Patient is drowsy and unable to provide history which was obtained from her husband who is at bedside. Patient is 62 year old Caucasian female who has advanced primary biliary cirrhosis and has been on waiting list for liver transplant for several months. She was scheduled to undergo live donor liver transplant last month it was postponed because of elevated right-sided pressures. According to her husband she was treated and had repeat cardiac catheter which was normal. Now she is scheduled to undergo liver transplant on 11/19/2013. Her son is the donor. Patient was admitted to this facility on 10/26/2013 for acute onset of confusion and elevated serum ammonia levels. She apparently had been sick for one week with diarrhea and one episode of vomiting. Patient's serum ammonia was 171 and it dropped to 66 within 24 hours with lactulose. He was discharged on 10/27/2013 but did not take lactulose as recommended. She apparently has been having diarrhea. She had multiple bowel movements yesterday and during the night. Early this morning she was noted to be disoriented and confused by her husband and she was also drowsy. She was therefore brought to the emergency room for evaluation. Serum ammonia was 270 umol/L. Patient was admitted to ICU and begun on lactulose. Patient's husband states that she is more alert than she was this morning. There is no history of nausea vomiting melena or rectal bleeding. There is also no history of fever, chills or dysuria. There is also no history of recent fall. She has lost about 10 pounds in the last 6 months.     Prior to Admission medications   Medication Sig Start Date End Date Taking? Authorizing Provider  Calcium Carbonate-Vitamin D (CALCIUM 600 + D PO) Take by mouth 2  (two) times daily.    Historical Provider, MD  cefUROXime (CEFTIN) 500 MG tablet Take 1 tablet (500 mg total) by mouth 2 (two) times daily with a meal. Start 12/17 in morning. 10/28/13   Standley Brooking, MD  ciprofloxacin (CIPRO) 750 MG tablet Take 750 mg by mouth once a week.    Historical Provider, MD  furosemide (LASIX) 40 MG tablet Take 20 mg by mouth 2 (two) times daily.     Historical Provider, MD  lactulose (CHRONULAC) 10 GM/15ML solution Take 30 mLs (20 g total) by mouth daily. Take for 7 days. 10/27/13   Standley Brooking, MD  levothyroxine (SYNTHROID, LEVOTHROID) 150 MCG tablet Take 150 mcg by mouth daily.    Historical Provider, MD  propranolol (INDERAL) 60 MG tablet Take 60 mg by mouth daily.    Historical Provider, MD  sildenafil (REVATIO) 20 MG tablet Take 1 tablet (20 mg total) by mouth 3 (three) times daily. 10/27/13   Standley Brooking, MD  spironolactone (ALDACTONE) 100 MG tablet Take 100-200 mg by mouth 2 (two) times daily. takes 2 tablets in the morning and 1 tablet in the afternoon    Historical Provider, MD  ursodiol (ACTIGALL) 300 MG capsule Take 900 mg by mouth 2 (two) times daily. The patient states that she is taking 3 in the morning and 3 in the evening    Historical Provider, MD  Zinc 50 MG TABS Take by mouth 2 (two) times daily.    Historical Provider, MD    Current Facility-Administered Medications  Medication Dose Route Frequency Provider Last Rate Last Dose  .  0.9 %  sodium chloride infusion  250 mL Intravenous PRN Gwenyth Bender, NP      . acetaminophen (TYLENOL) tablet 650 mg  650 mg Oral Q6H PRN Gwenyth Bender, NP       Or  . acetaminophen (TYLENOL) suppository 650 mg  650 mg Rectal Q6H PRN Gwenyth Bender, NP      . alum & mag hydroxide-simeth (MAALOX/MYLANTA) 200-200-20 MG/5ML suspension 30 mL  30 mL Oral Q6H PRN Lesle Chris Black, NP      . dextrose 5 %-0.45 % sodium chloride infusion   Intravenous Continuous Malissa Hippo, MD      . enoxaparin (LOVENOX)  injection 40 mg  40 mg Subcutaneous Q24H Gwenyth Bender, NP   40 mg at 11/02/13 1302  . lactulose (CHRONULAC) 10 GM/15ML solution 30 g  30 g Oral TID Gwenyth Bender, NP   30 g at 11/02/13 1306  . levothyroxine (SYNTHROID, LEVOTHROID) tablet 150 mcg  150 mcg Oral QAC breakfast Gwenyth Bender, NP      . ondansetron [facility]) tablet 4 mg  4 mg Oral Q6H PRN Gwenyth Bender, NP       Or  . ondansetron [facility]) injection 4 mg  4 mg Intravenous Q6H PRN Gwenyth Bender, NP      . rifaximin [facility]) tablet 550 mg  550 mg Oral BID Malissa Hippo, MD      . sildenafil (REVATIO) tablet 20 mg  20 mg Oral TID Gwenyth Bender, NP      . sodium chloride 0.9 % injection 3 mL  3 mL Intravenous Q12H Gwenyth Bender, NP   3 mL at 11/02/13 1306  . sodium chloride 0.9 % injection 3 mL  3 mL Intravenous PRN Gwenyth Bender, NP      . spironolactone (ALDACTONE) tablet 100 mg  100 mg Oral QPC supper Erick Blinks, MD      . Melene Muller ON 11/03/2013] spironolactone (ALDACTONE) tablet 200 mg  200 mg Oral QPC breakfast Gwenyth Bender, NP      . ursodiol (ACTIGALL) capsule 900 mg  900 mg Oral BID Gwenyth Bender, NP       Past medical history; Hypothyroidism for 30 years.  Chronic low back pain.  History of multiple right-sided colonic polyp one or more with high-grade dysplasia leading to right, colectomy in 2006 or 2007. Last colonoscopy was in June 2013 at [facility].  Splenectomy in March 2004 for ITP. Her platelet count apparently was 2K.  PBC was diagnosed in April 2005 at the time of laparoscopic ventral hernia repair.  She has undergone esophageal variceal banding x4 for primary prophylaxis all at [facility]; last exam was in June, 2013.  Multiple surgeries for ventral herniae(8 or 9). She had part of her colon resected during one of the surgeries in November 2012. Most recently ventral hernia was repaired about 3 months ago. She has large right flank hernia which may be fixed at the time of liver transplant or at a later date.  Allergies;   Codeine and oxycodone.  Family history;  Father was diagnosed with colon carcinoma at age 64 and had second primary at 59 died of pancreatic carcinoma at age 3.  Mother is 61 years old and in good health.  One brother died at age 46. Cause unknown but he drank too much alcohol.  Sister had surgery for colon carcinoma at age 22 and is fine at age 69.  Social history;  She's married. Has a 67 year old son in good health.  She worked at [employer] for 30 years but now on disability.  She has never smoked cigarettes.  Has had a few drinks of alcohol in her lifetime but none since March 2005.     Review of Systems: See HPI, otherwise normal ROS  Physical Exam: BP 107/57  Pulse 63  Temp(Src) 98.2 F (36.8 C) (Axillary)  Resp 16  Ht 5\' 5"  (1.651 m)  Wt 139 lb 12.4 oz (63.4 kg)  BMI 23.26 kg/m2  SpO2 99% Patient is drowsy but able to answer simple questions. She recognizes me by name. Asterixis present.  conjunctiva is pink. Sclerae nonicteric.  Pupils are equal and reactive.  Oropharyngeal mucosa is dry otherwise normal. No neck masses or thyromegaly noted. Neck is supple. Cardiac exam with regular rhythm normal S1 and S2. No murmur or gallop noted. Lungs are clear to auscultation. Abdomen is flat with large hernia at right flank of ascites of cantaloupe. There is well-healed Midline scar. Abdomen is flat soft and nontender without hepatomegaly. No angioedema noted.  .   Lab Results;  Recent Labs  11/02/13 0923  WBC 6.2  HGB 9.6*  HCT 30.0*  PLT 420*   BMET  Recent Labs  11/02/13 0923  NA 131*  K 4.9  CL 99  CO2 21  GLUCOSE 138*  BUN 21  CREATININE 1.12*  CALCIUM 9.2   LFT  Recent Labs  11/02/13 0923  PROT 8.4*  ALBUMIN 2.5*  AST 35  ALT 15  ALKPHOS 104  BILITOT 0.6  BILIDIR <0.1  IBILI NOT CALCULATED     Assessment; Patient is a 62 year old Caucasian female who has advanced PBC and hoping to undergo liver transplant(LDLT) on  11/19/2012. She was admitted with mental status changes and elevated serum ammonia. She was felt to have hepatic encephalopathy and admitted to ICU. Patient was briefly hospitalized for similar symptomatology one week ago and discharged after 24 hours. She has  recurrent hepatic encephalopathy. She has not been taking lactulose as recommended. There are no obvious triggers other than she may be mildly dehydrated. She has fairly advanced liver disease and hepatic encephalopathy may be spontaneous. Her mental status seems to have improved since admission. She definitely will benefit from addition of Xifaxan.  Recommendations; Continue lactulose at current dose. Xifaxan 550 mg by mouth twice a day. D5 half-normal saline at 75 mL per hour. Will begin 80 g protein 4 g sodium diet when she is more alert.   LOS: 0 days   REHMAN,NAJEEB U  11/02/2013, 1:42 PM

## 2013-11-02 NOTE — ED Notes (Signed)
Pt comes from home via EMS with c/o altered mental status which family first noticed this morning. Pt was recently admitted into hospital with similar symptoms and diagnosed with elevated ammonia levels. Family states pt had diarrhea last night and became altered this morning. Pt is alert to self but not to place, time, situation.

## 2013-11-02 NOTE — ED Provider Notes (Signed)
CSN: [id number]     Arrival date & time 11/02/13  0901 History  This chart was scribed for Benny Lennert, MD by Leone Payor, ED Scribe. This patient was seen in room APA15/APA15 and the patient's care was started 9:26 AM.    Chief Complaint  Patient presents with  . Altered Mental Status    Patient is a 62 y.o. female presenting with altered mental status. The history is provided by the EMS personnel (pt is confused). No language interpreter was used.  Altered Mental Status Presenting symptoms: disorientation and lethargy   Severity:  Moderate Most recent episode:  Today Episode history:  Multiple Duration:  2 hours Timing:  Constant Progression:  Unable to specify Chronicity:  Recurrent Context: not alcohol use     HPI Comments: Cassidy Barber is a 62 y.o. female brought in by ambulance, who presents to the Emergency Department complaining of altered mental status that began this morning. She was recently admitted into the hospital with similar symptoms and was diagnosed with elevated ammonia levels. Per nursing note, pt's family states she had diarrhea last night and became altered this morning.   Past Medical History  Diagnosis Date  . Liver disease   . Hernia of unspecified site of abdominal cavity without mention of obstruction or gangrene   . Thyroid disease   . Hypertension   . Primary biliary cirrhosis 10/26/2013  . Anemia of chronic disease 10/26/2013   Past Surgical History  Procedure Laterality Date  . Abdominal hernia repair      Patient's states that she has had 8- 9 hernia surgeries  . Splenectomy  2006  . Cholecystectomy  2007  . Colonoscopy      Done at [facility]  . Upper gastrointestinal endoscopy      Done at [facility]  . Colon surgery  2008    Done at [facility]  . Hernia repair     Family History  Problem Relation Age of Onset  . Healthy Mother   . Prostate cancer Father   . Colon cancer Sister   . Healthy Son    History  Substance Use Topics  . Smoking  status: Never Smoker   . Smokeless tobacco: Never Used  . Alcohol Use: No   OB History   Grav Para Term Preterm Abortions TAB SAB Ect Mult Living                 Review of Systems  Unable to perform ROS: Mental status change    Allergies  Codeine and Oxycodone  Home Medications   Current Outpatient Rx  Name  Route  Sig  Dispense  Refill  . Calcium Carbonate-Vitamin D (CALCIUM 600 + D PO)   Oral   Take by mouth 2 (two) times daily.         . cefUROXime (CEFTIN) 500 MG tablet   Oral   Take 1 tablet (500 mg total) by mouth 2 (two) times daily with a meal. Start 12/17 in morning.   10 tablet   0   . ciprofloxacin (CIPRO) 750 MG tablet   Oral   Take 750 mg by mouth once a week.         . furosemide (LASIX) 40 MG tablet   Oral   Take 20 mg by mouth 2 (two) times daily.          [name] lactulose (CHRONULAC) 10 GM/15ML solution   Oral   Take 30 mLs (20 g total) by mouth daily.  Take for 7 days.   240 mL   0   . levothyroxine (SYNTHROID, LEVOTHROID) 150 MCG tablet   Oral   Take 150 mcg by mouth daily.         . propranolol (INDERAL) 60 MG tablet   Oral   Take 60 mg by mouth daily.         . sildenafil (REVATIO) 20 MG tablet   Oral   Take 1 tablet (20 mg total) by mouth 3 (three) times daily.         [name] spironolactone (ALDACTONE) 100 MG tablet   Oral   Take 100-200 mg by mouth 2 (two) times daily. takes 2 tablets in the morning and 1 tablet in the afternoon         . ursodiol (ACTIGALL) 300 MG capsule   Oral   Take 900 mg by mouth 2 (two) times daily. The patient states that she is taking 3 in the morning and 3 in the evening         . Zinc 50 MG TABS   Oral   Take by mouth 2 (two) times daily.          BP 107/66  Pulse 62  Resp 16  SpO2 99% Physical Exam  Nursing note and vitals reviewed. Constitutional: She appears well-developed. She appears lethargic.  HENT:  Head: Normocephalic.  Eyes: Conjunctivae and EOM are normal. No scleral  icterus.  Neck: Neck supple. No thyromegaly present.  Cardiovascular: Normal rate, regular rhythm and normal heart sounds.  Exam reveals no gallop and no friction rub.   No murmur heard. Pulmonary/Chest: Effort normal and breath sounds normal. No stridor. She has no wheezes. She has no rales. She exhibits no tenderness.  Abdominal: She exhibits no distension. There is no tenderness. There is no rebound.  Musculoskeletal: Normal range of motion. She exhibits no edema.  Lymphadenopathy:    She has no cervical adenopathy.  Neurological: She appears lethargic. She is disoriented. She exhibits normal muscle tone. Coordination normal.  Only oriented to person.   Skin: No rash noted. No erythema.  Psychiatric: She has a normal mood and affect. Her behavior is normal.    ED Course  Procedures   DIAGNOSTIC STUDIES: Oxygen Saturation is 99% on RA, normal by my interpretation.    COORDINATION OF CARE: 9:30 AM Discussed treatment plan with pt at bedside and pt agreed to plan.   Labs Review Labs Reviewed  CBC WITH DIFFERENTIAL  BASIC METABOLIC PANEL  AMMONIA  URINALYSIS, ROUTINE W REFLEX MICROSCOPIC   Imaging Review No results found.  EKG Interpretation   None       MDM  The chart was scribed for me under my direct supervision.  I personally performed the history, physical, and medical decision making and all procedures in the evaluation of this patient.Benny Lennert, MD 11/02/13 1058

## 2013-11-03 DIAGNOSIS — D638 Anemia in other chronic diseases classified elsewhere: Secondary | ICD-10-CM | POA: Diagnosis not present

## 2013-11-03 DIAGNOSIS — K769 Liver disease, unspecified: Secondary | ICD-10-CM | POA: Diagnosis not present

## 2013-11-03 DIAGNOSIS — K7290 Hepatic failure, unspecified without coma: Secondary | ICD-10-CM | POA: Diagnosis not present

## 2013-11-03 DIAGNOSIS — Z7682 Awaiting organ transplant status: Secondary | ICD-10-CM | POA: Diagnosis not present

## 2013-11-03 DIAGNOSIS — E871 Hypo-osmolality and hyponatremia: Secondary | ICD-10-CM | POA: Diagnosis not present

## 2013-11-03 DIAGNOSIS — E872 Acidosis: Secondary | ICD-10-CM | POA: Diagnosis not present

## 2013-11-03 LAB — COMPREHENSIVE METABOLIC PANEL
ALT: 12 U/L (ref 0–35)
Albumin: 2.2 g/dL — ABNORMAL LOW (ref 3.5–5.2)
Alkaline Phosphatase: 89 U/L (ref 39–117)
BUN: 19 mg/dL (ref 6–23)
CO2: 18 mEq/L — ABNORMAL LOW (ref 19–32)
Calcium: 8.3 mg/dL — ABNORMAL LOW (ref 8.4–10.5)
Chloride: 104 mEq/L (ref 96–112)
GFR calc Af Amer: 90 mL/min — ABNORMAL LOW (ref >90)
GFR calc non Af Amer: 77 mL/min — ABNORMAL LOW (ref >90)
Glucose, Bld: 119 mg/dL — ABNORMAL HIGH (ref 70–99)
Total Bilirubin: 0.4 mg/dL (ref 0.3–1.2)
Total Protein: 7.4 g/dL (ref 6.0–8.3)

## 2013-11-03 LAB — AMMONIA: Ammonia: 90 umol/L — ABNORMAL HIGH (ref 11–60)

## 2013-11-03 MED ORDER — ZOLPIDEM TARTRATE 5 MG PO TABS
5.0000 mg | ORAL_TABLET | Freq: Every evening | ORAL | Status: DC | PRN
  Administered 2013-11-03 (×2): 5 mg via ORAL
  Filled 2013-11-03 (×2): qty 1

## 2013-11-03 NOTE — Plan of Care (Signed)
Problem: Phase I Progression Outcomes Goal: OOB as tolerated unless otherwise ordered Outcome: Progressing 11/03/13 1139 Patient assisted up to chair this morning, tolerated well. Up in chair for about an hour and half. Stated comfortable. Ambulates in room to bedside commode with nursing supervision. General weakness. Earnstine Regal, RN

## 2013-11-03 NOTE — Care Management Note (Signed)
    Page 1 of 1   11/03/2013     3:10:30 PM   CARE MANAGEMENT NOTE 11/03/2013  Patient:  Cassidy Barber, Cassidy Barber   Account Number:  [id number]  Date Initiated:  11/03/2013  Documentation initiated by:  Rosemary Holms  Subjective/Objective Assessment:   Pt from home and will DC to home. Active with [facility]. Requests a BSC from [facility].     Action/Plan:   Anticipated DC Date:  11/04/2013   Anticipated DC Plan:  HOME W HOME HEALTH SERVICES      DC Planning Services  CM consult      [facility] Choice  Resumption Of Svcs/PTA Provider   Choice offered to / List presented to:     DME arranged  BEDSIDE COMMODE      DME agency  [facility]        Status of service:  Completed, signed off Medicare Important Message given?   (If response is "NO", the following Medicare IM given date fields will be blank) Date Medicare IM given:   Date Additional Medicare IM given:    Discharge Disposition:    Per UR Regulation:    If discussed at Long Length of Stay Meetings, dates discussed:    Comments:  11/03/13 Rosemary Holms RN BSN CM

## 2013-11-03 NOTE — Progress Notes (Signed)
Subjective; Patient states she feels better. She still feels weak. She has good appetite. She denies headache nausea vomiting shortness of breath or abdominal pain. She has no difficulty voiding. She says her bowels have been moving.  Objective; BP 107/69  Pulse 75  Temp(Src) 98.7 F (37.1 C) (Oral)  Resp 16  Ht 5\' 5"  (1.651 m)  Wt 139 lb 1.8 oz (63.1 kg)  BMI 23.15 kg/m2  SpO2 100% Patient is alert and has asterixis. Abdominal exam pertinent for large right flank hernia but no hepatomegaly or tenderness noted. No LE edema noted.  Lab data; Serum ammonia 90; it was 270 yesterday(11-60). Serum sodium 133, potassium 3.8, chloride 104, CO2 18, glucose 119, BUN 19, creatinine 0.80. Serum calcium 8.3 Bilirubin 0.4, AP 89, AST 29, ALT 12 and albumin 2.2. INR 1.26 MELD score is 9.  Assessment; Recurrent hepatic encephalopathy in a patient with advanced PBC who is scheduled to undergo live donor liver transplant on 11/19/2013. Significant improvement in last 24 hours. She will need both lactulose and Xifaxan for management of HE until the time of liver transplant. Oral intake is satisfactory and I agree with stopping IV fluids.  Recommendations; Agree with plans to transfer patient to regular floor. May need lower doses of lactulose. Will adjust dose either later today or in am.

## 2013-11-03 NOTE — Progress Notes (Signed)
UR chart review completed.  

## 2013-11-03 NOTE — Progress Notes (Signed)
Patient seen and examined.  Agree with note as above per Toya Smothers, NP.  Patient is significantly improved with lactulose and xifaxan.  Ammonia level down to 90. She is much more awake and alert. Continue current treatments. Transfer to floor.  MEMON,JEHANZEB

## 2013-11-03 NOTE — Progress Notes (Signed)
[facility] PROGRESS NOTE  Cassidy Barber ZOX:[id number] DOB: 14-Aug-1951 DOA: 11/02/2013 PCP: Rudi Heap, MD  Assessment/Plan: Acute Hepatic encephalopathy: In patient with primary biliary cirrhosis reportedly scheduled for transplant January 2015. Much improved this am. Ammonia level 90 this am. Complains of "running". Tolerating diet. Will continue lactulose and rifaximin. Appreciate GI assistance. Per GI pt will need to titrate lactulose prn stool. Will keep dose as is today and decrease tomorrow. Per gi will need to stay on rifaximin couple of weeks.   Active Problems:  Hyponatremia. Improving. Likely related to #1 and frequent stool. IV fluids will be discontinued as tolerating diet well. Continue spironolactone and holding lasix. Continue rifaximin. Monitor closely   Metabolic acidosis: mild. Likely related to frequent stools secondary to lactulose. Will monitor closely and adjust as indicated.   Primary biliary cirrhosis: See #1. Holding the Lasix but continue the spironolactone.continue Actigall. Will monitor close   Hepatic failure: Reportedly patient is scheduled for live donor  transplant  at [facility] 11/19/12. Transplant coordinators name and phone number is noted on Dr. Irene Limbo is a discharge summary dated 10/27/2013.    Hypothyroidism: Continue home  Anemia of chronic disease: Remains stable at baseline   HTN: Pressure somewhat soft in the emergency department. She is on beta blocker Lasix and spironolactone at home. SBP range 95-117. Will continue her beta blocker and  hold her Lasix. Monitor close    Code Status: full Family Communication: none present Disposition Plan: home hopefully tomorrow   Consultants:  GI  Procedures:  none  Antibiotics:  none  HPI/Subjective: Sitting up eating breakfast. Has no memory of yesterday. Denies pain/discomfort.   Objective: Filed Vitals:   11/03/13 0849  BP:   Pulse:   Temp: 98.7 F (37.1 C)  Resp:      Intake/Output Summary (Last 24 hours) at 11/03/13 0857 Last data filed at 11/03/13 0840  Gross per 24 hour  Intake 1904.25 ml  Output      0 ml  Net 1904.25 ml   Filed Weights   11/02/13 1100 11/03/13 0500  Weight: 63.4 kg (139 lb 12.4 oz) 63.1 kg (139 lb 1.8 oz)    Exam:   General:  Alert, NAD  Cardiovascular: RRR No MGR No LE edema  Respiratory: normal effort BS clear bilaterally no wheeze  Abdomen: soft +BS non-tender to palpation  Musculoskeletal: no clubbing no cyanosis   Data Reviewed: Basic Metabolic Panel:  Recent Labs Lab 11/02/13 0923 11/03/13 0442  NA 131* 133*  K 4.9 3.8  CL 99 104  CO2 21 18*  GLUCOSE 138* 119*  BUN 21 19  CREATININE 1.12* 0.80  CALCIUM 9.2 8.3*   Liver Function Tests:  Recent Labs Lab 11/02/13 0923 11/03/13 0442  AST 35 29  ALT 15 12  ALKPHOS 104 89  BILITOT 0.6 0.4  PROT 8.4* 7.4  ALBUMIN 2.5* 2.2*   No results found for this basename: LIPASE, AMYLASE,  in the last 168 hours  Recent Labs Lab 11/02/13 0923 11/03/13 0442  AMMONIA 270* 90*   CBC:  Recent Labs Lab 11/02/13 0923  WBC 6.2  NEUTROABS 3.0  HGB 9.6*  HCT 30.0*  MCV 82.6  PLT 420*   Cardiac Enzymes: No results found for this basename: CKTOTAL, CKMB, CKMBINDEX, TROPONINI,  in the last 168 hours BNP (last 3 results) No results found for this basename: PROBNP,  in the last 8760 hours CBG: No results found for this basename: GLUCAP,  in the last 168 hours  Recent Results (from the past 240 hour(s))  URINE CULTURE     Status: None   Collection Time    10/26/13 11:35 AM      Result Value Range Status   Specimen Description URINE, CLEAN CATCH   Final   Special Requests NONE   Final   Culture  Setup Time     Final   Value: 10/28/2013 14:00     Performed at [facility] Count     Final   Value: >=100,000 COLONIES/ML     Performed at [facility]   Culture     Final   Value: Multiple bacterial morphotypes present,  none predominant. Suggest appropriate recollection if clinically indicated.     Performed at [facility]   Report Status 10/30/2013 FINAL   Final  MRSA PCR SCREENING     Status: None   Collection Time    11/02/13 11:43 AM      Result Value Range Status   MRSA by PCR NEGATIVE  NEGATIVE Final   Comment:            The GeneXpert MRSA Assay (FDA     approved for NASAL specimens     only), is one component of a     comprehensive MRSA colonization     surveillance program. It is not     intended to diagnose MRSA     infection nor to guide or     monitor treatment for     MRSA infections.     Studies: Portable Chest 1 View  11/02/2013   CLINICAL DATA:  Pleural effusion.  Cough.  EXAM: PORTABLE CHEST - 1 VIEW  COMPARISON:  09/08/2013.  FINDINGS: Previously identified left pleural fluid collection has resolved. Mild atelectasis versus infiltrates noted in the lung bases. Poor inspiration. Heart size and pulmonary vascularity normal. No pleural effusion or pneumothorax. No acute bony abnormality. Degenerative changes both shoulders.  IMPRESSION: 1. Interim resolution of left pleural effusion from 09/08/2013. 2. Poor inspiration with mild bibasilar subsegmental atelectasis. Mild infiltrates in lung bases cannot be excluded.   Electronically Signed   By: Maisie Fus  Register   On: 11/02/2013 14:04    Scheduled Meds: . enoxaparin (LOVENOX) injection  40 mg Subcutaneous Q24H  . lactulose  30 g Oral TID  . levothyroxine  150 mcg Oral QAC breakfast  . rifaximin  550 mg Oral BID  . sildenafil  20 mg Oral TID  . sodium chloride  3 mL Intravenous Q12H  . spironolactone  100 mg Oral QPC supper  . spironolactone  200 mg Oral QPC breakfast  . ursodiol  900 mg Oral BID   Continuous Infusions:   Principal Problem:   Acute encephalopathy Active Problems:   Hypothyroidism   Anemia of chronic disease   Primary biliary cirrhosis   Hepatic failure   Hyponatremia   Encephalopathy, hepatic    Metabolic acidosis    Time spent: 30 minutes    [facility] M  [facility] Pager [phone]. If 7PM-7AM, please contact night-coverage at [email or web address], password [facility] 11/03/2013, 8:57 AM  LOS: 1 day

## 2013-11-03 NOTE — Progress Notes (Signed)
11/03/13 1108 Report called to Ian Malkin, LPN. Patient in stable condition, transferring to room 337 on Dept 300. Earnstine Regal, RN

## 2013-11-03 NOTE — Progress Notes (Signed)
11/03/13 1220 Patient was transferred to Dept 300 in stable condition via w/c accompanied by nurse tech. Earnstine Regal, RN

## 2013-11-04 DIAGNOSIS — K7290 Hepatic failure, unspecified without coma: Secondary | ICD-10-CM | POA: Diagnosis not present

## 2013-11-04 DIAGNOSIS — K769 Liver disease, unspecified: Secondary | ICD-10-CM | POA: Diagnosis not present

## 2013-11-04 DIAGNOSIS — D638 Anemia in other chronic diseases classified elsewhere: Secondary | ICD-10-CM | POA: Diagnosis not present

## 2013-11-04 DIAGNOSIS — K745 Biliary cirrhosis, unspecified: Secondary | ICD-10-CM | POA: Diagnosis not present

## 2013-11-04 DIAGNOSIS — Z7682 Awaiting organ transplant status: Secondary | ICD-10-CM | POA: Diagnosis not present

## 2013-11-04 DIAGNOSIS — E871 Hypo-osmolality and hyponatremia: Secondary | ICD-10-CM | POA: Diagnosis not present

## 2013-11-04 LAB — CBC
HCT: 26.5 % — ABNORMAL LOW (ref 36.0–46.0)
Hemoglobin: 8.3 g/dL — ABNORMAL LOW (ref 12.0–15.0)
MCHC: 31.3 g/dL (ref 30.0–36.0)
MCV: 83.9 fL (ref 78.0–100.0)
Platelets: 376 10*3/uL (ref 150–400)
RBC: 3.16 MIL/uL — ABNORMAL LOW (ref 3.87–5.11)
RDW: 17.1 % — ABNORMAL HIGH (ref 11.5–15.5)
WBC: 7.4 10*3/uL (ref 4.0–10.5)

## 2013-11-04 LAB — BASIC METABOLIC PANEL
BUN: 14 mg/dL (ref 6–23)
Chloride: 104 mEq/L (ref 96–112)
Creatinine, Ser: 0.67 mg/dL (ref 0.50–1.10)
GFR calc Af Amer: >90 mL/min (ref >90)
GFR calc non Af Amer: >90 mL/min (ref >90)
Glucose, Bld: 104 mg/dL — ABNORMAL HIGH (ref 70–99)

## 2013-11-04 LAB — AMMONIA: Ammonia: 84 umol/L — ABNORMAL HIGH (ref 11–60)

## 2013-11-04 MED ORDER — LACTULOSE 10 GM/15ML PO SOLN: 20.0000 g | Freq: Two times a day (BID) | ORAL | Status: DC

## 2013-11-04 MED ORDER — RIFAXIMIN 550 MG PO TABS: 550.0000 mg | ORAL_TABLET | Freq: Two times a day (BID) | ORAL | Status: DC

## 2013-11-04 NOTE — Discharge Summary (Signed)
Physician Discharge Summary  Cassidy Barber ZOX:[id number] DOB: June 23, 1951 DOA: 11/02/2013  PCP: Rudi Heap, MD  Admit date: 11/02/2013 Discharge date: 11/04/2013  Time spent: 40 minutes  Recommendations for Outpatient Follow-up:  1. Followup with gastroenterology as previously scheduled  Discharge Diagnoses:  Principal Problem:   Acute encephalopathy Active Problems:   Hypothyroidism   Anemia of chronic disease   Primary biliary cirrhosis   Hepatic failure   Hyponatremia   Encephalopathy, hepatic   Metabolic acidosis   Discharge Condition: Improved  Diet recommendation: Low-salt  Filed Weights   11/02/13 1100 11/03/13 0500  Weight: 63.4 kg (139 lb 12.4 oz) 63.1 kg (139 lb 1.8 oz)    History of present illness:  Cassidy Barber is a 62 y.o. female with a past medical history that include primary biliary cirrhosis reportedly due for liver transplant on 11/19/2013 at [facility] (live donor), hypertension, anemia of chronic disease present to the emergency department with the chief complaint of sudden onset of confusion and lethargy. Information is obtained from the husband who is at the bedside. She was recently discharged on December 16 after a brief hospitalization for acute hepatic encephalopathy in the setting of urinary tract infection. She was discharged on antibiotics for her urinary tract infection and a 7 day course of lactulose. Husband reports that patient stopped taking the lactulose 2 days after discharge due to frequent bowel movements. He reports that her mental status remained at baseline until this morning. He reports that she continued to have very frequent bowel movements in spite of stopping the lactulose. Associated symptoms include some vomiting this morning. There is been no recent report of abdominal pain, dysuria hematuria frequency or urgency. He did say she has had intermittent coughing during this timeframe. There've been no chills or fever or sick contacts.  Initial evaluation in the emergency department yields an ammonia level of 270. In addition she is afebrile with stable vital signs. There is no hypoxia. Basic metabolic panel is notable for a sodium of 131 and a creatinine of 1.12, liver function tests are unremarkable. Urinalysis was unremarkable.   Hospital Course:  This patient was admitted to the hospital for hepatic encephalopathy. This is likely related to dehydration and noncompliance with lactulose. Patient was started back on lactulose, hydrated with IV fluids and started on Xifaxan by gastroenterology. Her encephalopathy quickly improved. Ammonia level on admission was noted to be 270. This quickly went down to 84. Patient is now awake, alert, ambulating and anxious to discharge home. She is scheduled to followup at [facility] for preoperative workup regarding her liver transplant next week. Patient has been cleared for discharge by gastroenterology.  Procedures:  None  Consultations:  Gastroenterology.  Discharge Exam: Filed Vitals:   11/04/13 0427  BP: 98/52  Pulse:   Temp: 98 F (36.7 C)  Resp: 18    General: No acute distress Cardiovascular: S1, S2, regular rate and rhythm Respiratory: Clear to auscultation bilaterally  Discharge Instructions  Discharge Orders   Future Orders Complete By Expires   Call MD for:  extreme fatigue  As directed    Call MD for:  persistant dizziness or light-headedness  As directed    Call MD for:  temperature >100.4  As directed    Diet - low sodium heart healthy  As directed    Increase activity slowly  As directed        Medication List    STOP taking these medications       propranolol 60  MG tablet  Commonly known as:  INDERAL      TAKE these medications       ALDACTONE 100 MG tablet  Generic drug:  spironolactone  Take 100-200 mg by mouth 2 (two) times daily. takes 2 tablets in the morning and 1 tablet in the afternoon     CALCIUM 600 + D PO  Take by mouth 2 (two) times  daily.     cefUROXime 500 MG tablet  Commonly known as:  CEFTIN  Take 1 tablet (500 mg total) by mouth 2 (two) times daily with a meal. Start 12/17 in morning.     furosemide 40 MG tablet  Commonly known as:  LASIX  Take 20 mg by mouth 2 (two) times daily.     lactulose 10 GM/15ML solution  Commonly known as:  CHRONULAC  Take 30 mLs (20 g total) by mouth 2 (two) times daily. Take for 7 days.     levothyroxine 150 MCG tablet  Commonly known as:  SYNTHROID, LEVOTHROID  Take 150 mcg by mouth daily.     rifaximin 550 MG Tabs tablet  Commonly known as:  XIFAXAN  Take 1 tablet (550 mg total) by mouth 2 (two) times daily.     sildenafil 20 MG tablet  Commonly known as:  REVATIO  Take 1 tablet (20 mg total) by mouth 3 (three) times daily.     ursodiol 300 MG capsule  Commonly known as:  ACTIGALL  Take 900 mg by mouth 2 (two) times daily. The patient states that she is taking 3 in the morning and 3 in the evening     Zinc 50 MG Tabs  Take by mouth 2 (two) times daily.       Allergies  Allergen Reactions  . Codeine Nausea Only  . Oxycodone Nausea Only       Follow-up Information   Follow up with Rudi Heap, MD. Schedule an appointment as soon as possible for a visit in 2 weeks.   Specialty:  Family Medicine   Contact information:   [address] [place] [address] [phone]        The results of significant diagnostics from this hospitalization (including imaging, microbiology, ancillary and laboratory) are listed below for reference.    Significant Diagnostic Studies: Portable Chest 1 View  11/02/2013   CLINICAL DATA:  Pleural effusion.  Cough.  EXAM: PORTABLE CHEST - 1 VIEW  COMPARISON:  09/08/2013.  FINDINGS: Previously identified left pleural fluid collection has resolved. Mild atelectasis versus infiltrates noted in the lung bases. Poor inspiration. Heart size and pulmonary vascularity normal. No pleural effusion or pneumothorax. No acute bony  abnormality. Degenerative changes both shoulders.  IMPRESSION: 1. Interim resolution of left pleural effusion from 09/08/2013. 2. Poor inspiration with mild bibasilar subsegmental atelectasis. Mild infiltrates in lung bases cannot be excluded.   Electronically Signed   By: Maisie Fus  Register   On: 11/02/2013 14:04    Microbiology: Recent Results (from the past 240 hour(s))  URINE CULTURE     Status: None   Collection Time    10/26/13 11:35 AM      Result Value Range Status   Specimen Description URINE, CLEAN CATCH   Final   Special Requests NONE   Final   Culture  Setup Time     Final   Value: 10/28/2013 14:00     Performed at [facility] Count     Final   Value: >=100,000 COLONIES/ML  Performed at [facility]     Final   Value: Multiple bacterial morphotypes present, none predominant. Suggest appropriate recollection if clinically indicated.     Performed at [facility]   Report Status 10/30/2013 FINAL   Final  MRSA PCR SCREENING     Status: None   Collection Time    11/02/13 11:43 AM      Result Value Range Status   MRSA by PCR NEGATIVE  NEGATIVE Final   Comment:            The GeneXpert MRSA Assay (FDA     approved for NASAL specimens     only), is one component of a     comprehensive MRSA colonization     surveillance program. It is not     intended to diagnose MRSA     infection nor to guide or     monitor treatment for     MRSA infections.     Labs: Basic Metabolic Panel:  Recent Labs Lab 11/02/13 0923 11/03/13 0442 11/04/13 0603  NA 131* 133* 133*  K 4.9 3.8 4.2  CL 99 104 104  CO2 21 18* 19  GLUCOSE 138* 119* 104*  BUN 21 19 14   CREATININE 1.12* 0.80 0.67  CALCIUM 9.2 8.3* 8.1*   Liver Function Tests:  Recent Labs Lab 11/02/13 0923 11/03/13 0442  AST 35 29  ALT 15 12  ALKPHOS 104 89  BILITOT 0.6 0.4  PROT 8.4* 7.4  ALBUMIN 2.5* 2.2*   No results found for this basename: LIPASE, AMYLASE,  in the  last 168 hours  Recent Labs Lab 11/02/13 0923 11/03/13 0442 11/04/13 0604  AMMONIA 270* 90* 84*   CBC:  Recent Labs Lab 11/02/13 0923 11/04/13 0603  WBC 6.2 7.4  NEUTROABS 3.0  --   HGB 9.6* 8.3*  HCT 30.0* 26.5*  MCV 82.6 83.9  PLT 420* 376   Cardiac Enzymes: No results found for this basename: CKTOTAL, CKMB, CKMBINDEX, TROPONINI,  in the last 168 hours BNP: BNP (last 3 results) No results found for this basename: PROBNP,  in the last 8760 hours CBG: No results found for this basename: GLUCAP,  in the last 168 hours     Signed:  MEMON,JEHANZEB  [facility] 11/04/2013, 6:53 PM

## 2013-11-04 NOTE — Progress Notes (Signed)
Subjective; Patient has no complaints. She had multiple bowel movements in the last 24 hours. She denies abdominal pain melena or rectal bleeding. She has good appetite and ate all of her breakfast. She says she has appointment at [facility] ([place] on 11/09/2013.  Objective; BP 98/52  Pulse 83  Temp(Src) 98 F (36.7 C) (Oral)  Resp 18  Ht 5\' 5"  (1.651 m)  Wt 139 lb 1.8 oz (63.1 kg)  BMI 23.15 kg/m2  SpO2 98% Patient is alert and does not have asterixis. Conjunctiva is somewhat pale. Sclerae nonicteric. No neck masses noted. Cardiac exam with a regular rhythm normal S1 and S2. No murmur or gallop noted. Lungs are clear to auscultation. Abdomen is asymmetric with large right flank hernia which is soft and nontender. Abdomen is otherwise flat and soft. No LE edema noted.  Lab data; WBC 7.4, H&H is 8.3 and 26.5 and platelet count 376K Serum sodium 133, potassium 4.2, chloride 104, CO2 19, BUN 14, creatinine 0.67, glucose 104 and calcium 8.1 Serum ammonia 84.  Assessment; #1. Hepatic encephalopathy. Clinically much improved. Serum ammonia is coming down but still elevated. Patient will need to continue Xifaxan and lactulose until the time of liver transplant. Lactulose dose could be reduced. #2. Anemia. She generally runs hemoglobin of around 10 and now it is 8.3. No evidence of overt GI bleed. She has an appointment [facility] next week and if they recommend transfusion will be arranged here or at that facility. #3. End stage PBC. Patient to undergo LDLT on 11/19/2013.  Recommendations; Agree with discharge planning. Continue Xifaxan at 550 mg by mouth twice a day. Lactulose 15-30 mL by mouth twice a day. Goal is for her to have 3-4 bowel movements per day.

## 2013-11-04 NOTE — Progress Notes (Signed)
D/c instructions reviewed with patient and husband.  Verbalized understanding. Pt dc'd to home with husband.  Schonewitz, Candelaria Stagers 11/04/2013

## 2013-11-12 LAB — HM MAMMOGRAPHY

## 2013-11-16 DIAGNOSIS — N6009 Solitary cyst of unspecified breast: Secondary | ICD-10-CM | POA: Diagnosis not present

## 2013-11-16 DIAGNOSIS — K7460 Unspecified cirrhosis of liver: Secondary | ICD-10-CM | POA: Diagnosis not present

## 2013-11-16 DIAGNOSIS — Z1231 Encounter for screening mammogram for malignant neoplasm of breast: Secondary | ICD-10-CM | POA: Diagnosis not present

## 2013-11-16 DIAGNOSIS — R928 Other abnormal and inconclusive findings on diagnostic imaging of breast: Secondary | ICD-10-CM | POA: Diagnosis not present

## 2013-11-16 DIAGNOSIS — N63 Unspecified lump in unspecified breast: Secondary | ICD-10-CM | POA: Diagnosis not present

## 2013-11-16 DIAGNOSIS — Z800 Family history of malignant neoplasm of digestive organs: Secondary | ICD-10-CM | POA: Diagnosis not present

## 2013-11-19 DIAGNOSIS — E871 Hypo-osmolality and hyponatremia: Secondary | ICD-10-CM | POA: Diagnosis not present

## 2013-11-19 DIAGNOSIS — R748 Abnormal levels of other serum enzymes: Secondary | ICD-10-CM | POA: Diagnosis not present

## 2013-11-19 DIAGNOSIS — J9819 Other pulmonary collapse: Secondary | ICD-10-CM | POA: Diagnosis not present

## 2013-11-19 DIAGNOSIS — I509 Heart failure, unspecified: Secondary | ICD-10-CM | POA: Diagnosis not present

## 2013-11-19 DIAGNOSIS — R569 Unspecified convulsions: Secondary | ICD-10-CM | POA: Diagnosis not present

## 2013-11-19 DIAGNOSIS — K7460 Unspecified cirrhosis of liver: Secondary | ICD-10-CM | POA: Diagnosis not present

## 2013-11-19 DIAGNOSIS — R578 Other shock: Secondary | ICD-10-CM | POA: Diagnosis not present

## 2013-11-19 DIAGNOSIS — J9589 Other postprocedural complications and disorders of respiratory system, not elsewhere classified: Secondary | ICD-10-CM | POA: Diagnosis not present

## 2013-11-19 DIAGNOSIS — IMO0002 Reserved for concepts with insufficient information to code with codable children: Secondary | ICD-10-CM | POA: Diagnosis not present

## 2013-11-19 DIAGNOSIS — Z86718 Personal history of other venous thrombosis and embolism: Secondary | ICD-10-CM | POA: Diagnosis not present

## 2013-11-19 DIAGNOSIS — K766 Portal hypertension: Secondary | ICD-10-CM | POA: Diagnosis present

## 2013-11-19 DIAGNOSIS — Z298 Encounter for other specified prophylactic measures: Secondary | ICD-10-CM | POA: Diagnosis not present

## 2013-11-19 DIAGNOSIS — K7689 Other specified diseases of liver: Secondary | ICD-10-CM | POA: Diagnosis not present

## 2013-11-19 DIAGNOSIS — Z4682 Encounter for fitting and adjustment of non-vascular catheter: Secondary | ICD-10-CM | POA: Diagnosis not present

## 2013-11-19 DIAGNOSIS — I270 Primary pulmonary hypertension: Secondary | ICD-10-CM | POA: Diagnosis not present

## 2013-11-19 DIAGNOSIS — K660 Peritoneal adhesions (postprocedural) (postinfection): Secondary | ICD-10-CM | POA: Diagnosis not present

## 2013-11-19 DIAGNOSIS — I2789 Other specified pulmonary heart diseases: Secondary | ICD-10-CM | POA: Diagnosis not present

## 2013-11-19 DIAGNOSIS — Z0289 Encounter for other administrative examinations: Secondary | ICD-10-CM | POA: Diagnosis not present

## 2013-11-19 DIAGNOSIS — E039 Hypothyroidism, unspecified: Secondary | ICD-10-CM | POA: Diagnosis present

## 2013-11-19 DIAGNOSIS — D899 Disorder involving the immune mechanism, unspecified: Secondary | ICD-10-CM | POA: Diagnosis not present

## 2013-11-19 DIAGNOSIS — Z452 Encounter for adjustment and management of vascular access device: Secondary | ICD-10-CM | POA: Diagnosis not present

## 2013-11-19 DIAGNOSIS — Z526 Liver donor: Secondary | ICD-10-CM | POA: Diagnosis not present

## 2013-11-19 DIAGNOSIS — K432 Incisional hernia without obstruction or gangrene: Secondary | ICD-10-CM | POA: Diagnosis present

## 2013-11-19 DIAGNOSIS — K430 Incisional hernia with obstruction, without gangrene: Secondary | ICD-10-CM | POA: Diagnosis not present

## 2013-11-19 DIAGNOSIS — R0902 Hypoxemia: Secondary | ICD-10-CM | POA: Diagnosis not present

## 2013-11-19 DIAGNOSIS — K319 Disease of stomach and duodenum, unspecified: Secondary | ICD-10-CM | POA: Diagnosis present

## 2013-11-19 DIAGNOSIS — I498 Other specified cardiac arrhythmias: Secondary | ICD-10-CM | POA: Diagnosis not present

## 2013-11-19 DIAGNOSIS — Z9089 Acquired absence of other organs: Secondary | ICD-10-CM | POA: Diagnosis not present

## 2013-11-19 DIAGNOSIS — J90 Pleural effusion, not elsewhere classified: Secondary | ICD-10-CM | POA: Diagnosis not present

## 2013-11-19 DIAGNOSIS — R918 Other nonspecific abnormal finding of lung field: Secondary | ICD-10-CM | POA: Diagnosis not present

## 2013-11-19 DIAGNOSIS — I517 Cardiomegaly: Secondary | ICD-10-CM | POA: Diagnosis not present

## 2013-11-19 DIAGNOSIS — F0390 Unspecified dementia without behavioral disturbance: Secondary | ICD-10-CM | POA: Diagnosis present

## 2013-11-19 DIAGNOSIS — R109 Unspecified abdominal pain: Secondary | ICD-10-CM | POA: Diagnosis not present

## 2013-11-19 DIAGNOSIS — R198 Other specified symptoms and signs involving the digestive system and abdomen: Secondary | ICD-10-CM | POA: Diagnosis not present

## 2013-11-19 DIAGNOSIS — Z008 Encounter for other general examination: Secondary | ICD-10-CM | POA: Diagnosis not present

## 2013-11-19 DIAGNOSIS — K745 Biliary cirrhosis, unspecified: Secondary | ICD-10-CM | POA: Diagnosis not present

## 2013-11-19 DIAGNOSIS — F05 Delirium due to known physiological condition: Secondary | ICD-10-CM | POA: Diagnosis not present

## 2013-11-19 DIAGNOSIS — Z79899 Other long term (current) drug therapy: Secondary | ICD-10-CM | POA: Diagnosis not present

## 2013-11-19 DIAGNOSIS — G8918 Other acute postprocedural pain: Secondary | ICD-10-CM | POA: Diagnosis not present

## 2013-11-19 DIAGNOSIS — R7989 Other specified abnormal findings of blood chemistry: Secondary | ICD-10-CM | POA: Diagnosis not present

## 2013-11-19 DIAGNOSIS — J811 Chronic pulmonary edema: Secondary | ICD-10-CM | POA: Diagnosis not present

## 2013-11-19 DIAGNOSIS — R197 Diarrhea, unspecified: Secondary | ICD-10-CM | POA: Diagnosis not present

## 2013-11-19 DIAGNOSIS — Z944 Liver transplant status: Secondary | ICD-10-CM | POA: Diagnosis not present

## 2013-11-19 DIAGNOSIS — G9340 Encephalopathy, unspecified: Secondary | ICD-10-CM | POA: Diagnosis not present

## 2013-11-19 DIAGNOSIS — R404 Transient alteration of awareness: Secondary | ICD-10-CM | POA: Diagnosis not present

## 2013-11-19 DIAGNOSIS — J9600 Acute respiratory failure, unspecified whether with hypoxia or hypercapnia: Secondary | ICD-10-CM | POA: Diagnosis not present

## 2013-11-19 DIAGNOSIS — R9431 Abnormal electrocardiogram [ECG] [EKG]: Secondary | ICD-10-CM | POA: Diagnosis not present

## 2013-11-19 DIAGNOSIS — E46 Unspecified protein-calorie malnutrition: Secondary | ICD-10-CM | POA: Diagnosis not present

## 2013-11-19 DIAGNOSIS — I279 Pulmonary heart disease, unspecified: Secondary | ICD-10-CM | POA: Diagnosis present

## 2013-11-19 DIAGNOSIS — I81 Portal vein thrombosis: Secondary | ICD-10-CM | POA: Diagnosis not present

## 2013-11-19 DIAGNOSIS — K769 Liver disease, unspecified: Secondary | ICD-10-CM | POA: Diagnosis not present

## 2013-11-19 DIAGNOSIS — I10 Essential (primary) hypertension: Secondary | ICD-10-CM | POA: Diagnosis not present

## 2013-11-19 DIAGNOSIS — E870 Hyperosmolality and hypernatremia: Secondary | ICD-10-CM | POA: Diagnosis not present

## 2013-11-19 DIAGNOSIS — R112 Nausea with vomiting, unspecified: Secondary | ICD-10-CM | POA: Diagnosis not present

## 2013-11-19 HISTORY — PX: LIVER TRANSPLANT: SHX410

## 2013-12-03 DIAGNOSIS — E46 Unspecified protein-calorie malnutrition: Secondary | ICD-10-CM | POA: Diagnosis not present

## 2013-12-03 DIAGNOSIS — I2789 Other specified pulmonary heart diseases: Secondary | ICD-10-CM | POA: Diagnosis not present

## 2013-12-03 DIAGNOSIS — Z48815 Encounter for surgical aftercare following surgery on the digestive system: Secondary | ICD-10-CM | POA: Diagnosis not present

## 2013-12-03 DIAGNOSIS — Z79899 Other long term (current) drug therapy: Secondary | ICD-10-CM | POA: Diagnosis not present

## 2013-12-03 DIAGNOSIS — Z944 Liver transplant status: Secondary | ICD-10-CM | POA: Diagnosis not present

## 2013-12-03 DIAGNOSIS — T17308A Unspecified foreign body in larynx causing other injury, initial encounter: Secondary | ICD-10-CM | POA: Diagnosis not present

## 2013-12-03 DIAGNOSIS — R569 Unspecified convulsions: Secondary | ICD-10-CM | POA: Diagnosis not present

## 2013-12-03 DIAGNOSIS — Z940 Kidney transplant status: Secondary | ICD-10-CM | POA: Diagnosis not present

## 2013-12-04 DIAGNOSIS — Z944 Liver transplant status: Secondary | ICD-10-CM | POA: Diagnosis not present

## 2013-12-04 DIAGNOSIS — E46 Unspecified protein-calorie malnutrition: Secondary | ICD-10-CM | POA: Diagnosis not present

## 2013-12-04 DIAGNOSIS — I2789 Other specified pulmonary heart diseases: Secondary | ICD-10-CM | POA: Diagnosis not present

## 2013-12-04 DIAGNOSIS — R569 Unspecified convulsions: Secondary | ICD-10-CM | POA: Diagnosis not present

## 2013-12-04 DIAGNOSIS — Z48815 Encounter for surgical aftercare following surgery on the digestive system: Secondary | ICD-10-CM | POA: Diagnosis not present

## 2013-12-07 DIAGNOSIS — R569 Unspecified convulsions: Secondary | ICD-10-CM | POA: Diagnosis not present

## 2013-12-07 DIAGNOSIS — R609 Edema, unspecified: Secondary | ICD-10-CM | POA: Diagnosis not present

## 2013-12-07 DIAGNOSIS — I2789 Other specified pulmonary heart diseases: Secondary | ICD-10-CM | POA: Diagnosis not present

## 2013-12-07 DIAGNOSIS — Z09 Encounter for follow-up examination after completed treatment for conditions other than malignant neoplasm: Secondary | ICD-10-CM | POA: Diagnosis not present

## 2013-12-07 DIAGNOSIS — Z940 Kidney transplant status: Secondary | ICD-10-CM | POA: Diagnosis not present

## 2013-12-07 DIAGNOSIS — E46 Unspecified protein-calorie malnutrition: Secondary | ICD-10-CM | POA: Diagnosis not present

## 2013-12-07 DIAGNOSIS — Z944 Liver transplant status: Secondary | ICD-10-CM | POA: Diagnosis not present

## 2013-12-07 DIAGNOSIS — R000 Tachycardia, unspecified: Secondary | ICD-10-CM | POA: Diagnosis not present

## 2013-12-07 DIAGNOSIS — Z79899 Other long term (current) drug therapy: Secondary | ICD-10-CM | POA: Diagnosis not present

## 2013-12-07 DIAGNOSIS — Z298 Encounter for other specified prophylactic measures: Secondary | ICD-10-CM | POA: Diagnosis not present

## 2013-12-07 DIAGNOSIS — Z48815 Encounter for surgical aftercare following surgery on the digestive system: Secondary | ICD-10-CM | POA: Diagnosis not present

## 2013-12-08 DIAGNOSIS — Z944 Liver transplant status: Secondary | ICD-10-CM | POA: Diagnosis not present

## 2013-12-08 DIAGNOSIS — R569 Unspecified convulsions: Secondary | ICD-10-CM | POA: Diagnosis not present

## 2013-12-08 DIAGNOSIS — E46 Unspecified protein-calorie malnutrition: Secondary | ICD-10-CM | POA: Diagnosis not present

## 2013-12-08 DIAGNOSIS — I2789 Other specified pulmonary heart diseases: Secondary | ICD-10-CM | POA: Diagnosis not present

## 2013-12-08 DIAGNOSIS — Z48815 Encounter for surgical aftercare following surgery on the digestive system: Secondary | ICD-10-CM | POA: Diagnosis not present

## 2013-12-09 DIAGNOSIS — I2789 Other specified pulmonary heart diseases: Secondary | ICD-10-CM | POA: Diagnosis not present

## 2013-12-09 DIAGNOSIS — Z48815 Encounter for surgical aftercare following surgery on the digestive system: Secondary | ICD-10-CM | POA: Diagnosis not present

## 2013-12-09 DIAGNOSIS — R569 Unspecified convulsions: Secondary | ICD-10-CM | POA: Diagnosis not present

## 2013-12-09 DIAGNOSIS — E46 Unspecified protein-calorie malnutrition: Secondary | ICD-10-CM | POA: Diagnosis not present

## 2013-12-09 DIAGNOSIS — Z944 Liver transplant status: Secondary | ICD-10-CM | POA: Diagnosis not present

## 2013-12-10 DIAGNOSIS — Z5181 Encounter for therapeutic drug level monitoring: Secondary | ICD-10-CM | POA: Diagnosis not present

## 2013-12-10 DIAGNOSIS — Z944 Liver transplant status: Secondary | ICD-10-CM | POA: Diagnosis not present

## 2013-12-10 DIAGNOSIS — I2789 Other specified pulmonary heart diseases: Secondary | ICD-10-CM | POA: Diagnosis not present

## 2013-12-10 DIAGNOSIS — Z940 Kidney transplant status: Secondary | ICD-10-CM | POA: Diagnosis not present

## 2013-12-10 DIAGNOSIS — E46 Unspecified protein-calorie malnutrition: Secondary | ICD-10-CM | POA: Diagnosis not present

## 2013-12-10 DIAGNOSIS — R569 Unspecified convulsions: Secondary | ICD-10-CM | POA: Diagnosis not present

## 2013-12-10 DIAGNOSIS — Z48815 Encounter for surgical aftercare following surgery on the digestive system: Secondary | ICD-10-CM | POA: Diagnosis not present

## 2013-12-10 DIAGNOSIS — Z09 Encounter for follow-up examination after completed treatment for conditions other than malignant neoplasm: Secondary | ICD-10-CM | POA: Diagnosis not present

## 2013-12-10 DIAGNOSIS — Z79899 Other long term (current) drug therapy: Secondary | ICD-10-CM | POA: Diagnosis not present

## 2013-12-11 DIAGNOSIS — I2789 Other specified pulmonary heart diseases: Secondary | ICD-10-CM | POA: Diagnosis not present

## 2013-12-11 DIAGNOSIS — Z944 Liver transplant status: Secondary | ICD-10-CM | POA: Diagnosis not present

## 2013-12-11 DIAGNOSIS — R569 Unspecified convulsions: Secondary | ICD-10-CM | POA: Diagnosis not present

## 2013-12-11 DIAGNOSIS — Z48815 Encounter for surgical aftercare following surgery on the digestive system: Secondary | ICD-10-CM | POA: Diagnosis not present

## 2013-12-11 DIAGNOSIS — E46 Unspecified protein-calorie malnutrition: Secondary | ICD-10-CM | POA: Diagnosis not present

## 2013-12-14 DIAGNOSIS — Z79899 Other long term (current) drug therapy: Secondary | ICD-10-CM | POA: Diagnosis not present

## 2013-12-14 DIAGNOSIS — R569 Unspecified convulsions: Secondary | ICD-10-CM | POA: Diagnosis not present

## 2013-12-14 DIAGNOSIS — Z09 Encounter for follow-up examination after completed treatment for conditions other than malignant neoplasm: Secondary | ICD-10-CM | POA: Diagnosis not present

## 2013-12-14 DIAGNOSIS — E46 Unspecified protein-calorie malnutrition: Secondary | ICD-10-CM | POA: Diagnosis not present

## 2013-12-14 DIAGNOSIS — Z944 Liver transplant status: Secondary | ICD-10-CM | POA: Diagnosis not present

## 2013-12-14 DIAGNOSIS — Z940 Kidney transplant status: Secondary | ICD-10-CM | POA: Diagnosis not present

## 2013-12-14 DIAGNOSIS — I2789 Other specified pulmonary heart diseases: Secondary | ICD-10-CM | POA: Diagnosis not present

## 2013-12-14 DIAGNOSIS — R079 Chest pain, unspecified: Secondary | ICD-10-CM | POA: Diagnosis not present

## 2013-12-14 DIAGNOSIS — Z48815 Encounter for surgical aftercare following surgery on the digestive system: Secondary | ICD-10-CM | POA: Diagnosis not present

## 2013-12-16 DIAGNOSIS — E46 Unspecified protein-calorie malnutrition: Secondary | ICD-10-CM | POA: Diagnosis not present

## 2013-12-16 DIAGNOSIS — Z48815 Encounter for surgical aftercare following surgery on the digestive system: Secondary | ICD-10-CM | POA: Diagnosis not present

## 2013-12-16 DIAGNOSIS — R569 Unspecified convulsions: Secondary | ICD-10-CM | POA: Diagnosis not present

## 2013-12-16 DIAGNOSIS — Z944 Liver transplant status: Secondary | ICD-10-CM | POA: Diagnosis not present

## 2013-12-16 DIAGNOSIS — I2789 Other specified pulmonary heart diseases: Secondary | ICD-10-CM | POA: Diagnosis not present

## 2013-12-17 DIAGNOSIS — Z48815 Encounter for surgical aftercare following surgery on the digestive system: Secondary | ICD-10-CM | POA: Diagnosis not present

## 2013-12-17 DIAGNOSIS — Z79899 Other long term (current) drug therapy: Secondary | ICD-10-CM | POA: Diagnosis not present

## 2013-12-17 DIAGNOSIS — E46 Unspecified protein-calorie malnutrition: Secondary | ICD-10-CM | POA: Diagnosis not present

## 2013-12-17 DIAGNOSIS — Z7902 Long term (current) use of antithrombotics/antiplatelets: Secondary | ICD-10-CM | POA: Diagnosis not present

## 2013-12-17 DIAGNOSIS — R569 Unspecified convulsions: Secondary | ICD-10-CM | POA: Diagnosis not present

## 2013-12-17 DIAGNOSIS — Z944 Liver transplant status: Secondary | ICD-10-CM | POA: Diagnosis not present

## 2013-12-17 DIAGNOSIS — Z09 Encounter for follow-up examination after completed treatment for conditions other than malignant neoplasm: Secondary | ICD-10-CM | POA: Diagnosis not present

## 2013-12-17 DIAGNOSIS — I2789 Other specified pulmonary heart diseases: Secondary | ICD-10-CM | POA: Diagnosis not present

## 2013-12-17 DIAGNOSIS — Z940 Kidney transplant status: Secondary | ICD-10-CM | POA: Diagnosis not present

## 2013-12-18 DIAGNOSIS — IMO0002 Reserved for concepts with insufficient information to code with codable children: Secondary | ICD-10-CM | POA: Diagnosis not present

## 2013-12-18 DIAGNOSIS — R188 Other ascites: Secondary | ICD-10-CM | POA: Diagnosis not present

## 2013-12-18 DIAGNOSIS — K7689 Other specified diseases of liver: Secondary | ICD-10-CM | POA: Diagnosis not present

## 2013-12-18 DIAGNOSIS — Z944 Liver transplant status: Secondary | ICD-10-CM | POA: Diagnosis not present

## 2013-12-21 DIAGNOSIS — Z48815 Encounter for surgical aftercare following surgery on the digestive system: Secondary | ICD-10-CM | POA: Diagnosis not present

## 2013-12-21 DIAGNOSIS — E46 Unspecified protein-calorie malnutrition: Secondary | ICD-10-CM | POA: Diagnosis not present

## 2013-12-21 DIAGNOSIS — R569 Unspecified convulsions: Secondary | ICD-10-CM | POA: Diagnosis not present

## 2013-12-21 DIAGNOSIS — Z944 Liver transplant status: Secondary | ICD-10-CM | POA: Diagnosis not present

## 2013-12-21 DIAGNOSIS — Z79899 Other long term (current) drug therapy: Secondary | ICD-10-CM | POA: Diagnosis not present

## 2013-12-21 DIAGNOSIS — Z09 Encounter for follow-up examination after completed treatment for conditions other than malignant neoplasm: Secondary | ICD-10-CM | POA: Diagnosis not present

## 2013-12-21 DIAGNOSIS — I2789 Other specified pulmonary heart diseases: Secondary | ICD-10-CM | POA: Diagnosis not present

## 2013-12-21 DIAGNOSIS — Z940 Kidney transplant status: Secondary | ICD-10-CM | POA: Diagnosis not present

## 2013-12-21 DIAGNOSIS — Z5181 Encounter for therapeutic drug level monitoring: Secondary | ICD-10-CM | POA: Diagnosis not present

## 2013-12-22 DIAGNOSIS — Z944 Liver transplant status: Secondary | ICD-10-CM | POA: Diagnosis not present

## 2013-12-22 DIAGNOSIS — Z48815 Encounter for surgical aftercare following surgery on the digestive system: Secondary | ICD-10-CM | POA: Diagnosis not present

## 2013-12-22 DIAGNOSIS — E46 Unspecified protein-calorie malnutrition: Secondary | ICD-10-CM | POA: Diagnosis not present

## 2013-12-22 DIAGNOSIS — I2789 Other specified pulmonary heart diseases: Secondary | ICD-10-CM | POA: Diagnosis not present

## 2013-12-22 DIAGNOSIS — R569 Unspecified convulsions: Secondary | ICD-10-CM | POA: Diagnosis not present

## 2013-12-24 DIAGNOSIS — Z79899 Other long term (current) drug therapy: Secondary | ICD-10-CM | POA: Diagnosis not present

## 2013-12-24 DIAGNOSIS — Z48815 Encounter for surgical aftercare following surgery on the digestive system: Secondary | ICD-10-CM | POA: Diagnosis not present

## 2013-12-24 DIAGNOSIS — Z944 Liver transplant status: Secondary | ICD-10-CM | POA: Diagnosis not present

## 2013-12-24 DIAGNOSIS — E46 Unspecified protein-calorie malnutrition: Secondary | ICD-10-CM | POA: Diagnosis not present

## 2013-12-24 DIAGNOSIS — Z940 Kidney transplant status: Secondary | ICD-10-CM | POA: Diagnosis not present

## 2013-12-24 DIAGNOSIS — Z09 Encounter for follow-up examination after completed treatment for conditions other than malignant neoplasm: Secondary | ICD-10-CM | POA: Diagnosis not present

## 2013-12-24 DIAGNOSIS — R569 Unspecified convulsions: Secondary | ICD-10-CM | POA: Diagnosis not present

## 2013-12-24 DIAGNOSIS — Z7901 Long term (current) use of anticoagulants: Secondary | ICD-10-CM | POA: Diagnosis not present

## 2013-12-24 DIAGNOSIS — I2789 Other specified pulmonary heart diseases: Secondary | ICD-10-CM | POA: Diagnosis not present

## 2013-12-25 DIAGNOSIS — I10 Essential (primary) hypertension: Secondary | ICD-10-CM | POA: Diagnosis not present

## 2013-12-25 DIAGNOSIS — I509 Heart failure, unspecified: Secondary | ICD-10-CM | POA: Diagnosis not present

## 2013-12-25 DIAGNOSIS — I2789 Other specified pulmonary heart diseases: Secondary | ICD-10-CM | POA: Diagnosis not present

## 2013-12-25 DIAGNOSIS — K7460 Unspecified cirrhosis of liver: Secondary | ICD-10-CM | POA: Diagnosis not present

## 2013-12-28 DIAGNOSIS — E46 Unspecified protein-calorie malnutrition: Secondary | ICD-10-CM | POA: Diagnosis not present

## 2013-12-28 DIAGNOSIS — Z48815 Encounter for surgical aftercare following surgery on the digestive system: Secondary | ICD-10-CM | POA: Diagnosis not present

## 2013-12-28 DIAGNOSIS — R569 Unspecified convulsions: Secondary | ICD-10-CM | POA: Diagnosis not present

## 2013-12-28 DIAGNOSIS — Z09 Encounter for follow-up examination after completed treatment for conditions other than malignant neoplasm: Secondary | ICD-10-CM | POA: Diagnosis not present

## 2013-12-28 DIAGNOSIS — Z79899 Other long term (current) drug therapy: Secondary | ICD-10-CM | POA: Diagnosis not present

## 2013-12-28 DIAGNOSIS — Z940 Kidney transplant status: Secondary | ICD-10-CM | POA: Diagnosis not present

## 2013-12-28 DIAGNOSIS — Z5181 Encounter for therapeutic drug level monitoring: Secondary | ICD-10-CM | POA: Diagnosis not present

## 2013-12-28 DIAGNOSIS — Z944 Liver transplant status: Secondary | ICD-10-CM | POA: Diagnosis not present

## 2013-12-28 DIAGNOSIS — I2789 Other specified pulmonary heart diseases: Secondary | ICD-10-CM | POA: Diagnosis not present

## 2013-12-31 DIAGNOSIS — Z79899 Other long term (current) drug therapy: Secondary | ICD-10-CM | POA: Diagnosis not present

## 2013-12-31 DIAGNOSIS — E46 Unspecified protein-calorie malnutrition: Secondary | ICD-10-CM | POA: Diagnosis not present

## 2013-12-31 DIAGNOSIS — I2789 Other specified pulmonary heart diseases: Secondary | ICD-10-CM | POA: Diagnosis not present

## 2013-12-31 DIAGNOSIS — R079 Chest pain, unspecified: Secondary | ICD-10-CM | POA: Diagnosis not present

## 2013-12-31 DIAGNOSIS — Z944 Liver transplant status: Secondary | ICD-10-CM | POA: Diagnosis not present

## 2013-12-31 DIAGNOSIS — R569 Unspecified convulsions: Secondary | ICD-10-CM | POA: Diagnosis not present

## 2013-12-31 DIAGNOSIS — Z48815 Encounter for surgical aftercare following surgery on the digestive system: Secondary | ICD-10-CM | POA: Diagnosis not present

## 2013-12-31 DIAGNOSIS — Z09 Encounter for follow-up examination after completed treatment for conditions other than malignant neoplasm: Secondary | ICD-10-CM | POA: Diagnosis not present

## 2013-12-31 DIAGNOSIS — Z940 Kidney transplant status: Secondary | ICD-10-CM | POA: Diagnosis not present

## 2014-01-01 DIAGNOSIS — Z48298 Encounter for aftercare following other organ transplant: Secondary | ICD-10-CM | POA: Diagnosis not present

## 2014-01-01 DIAGNOSIS — Z944 Liver transplant status: Secondary | ICD-10-CM | POA: Diagnosis not present

## 2014-01-01 DIAGNOSIS — Z006 Encounter for examination for normal comparison and control in clinical research program: Secondary | ICD-10-CM | POA: Diagnosis not present

## 2014-01-01 DIAGNOSIS — R1011 Right upper quadrant pain: Secondary | ICD-10-CM | POA: Diagnosis not present

## 2014-01-04 DIAGNOSIS — R079 Chest pain, unspecified: Secondary | ICD-10-CM | POA: Diagnosis not present

## 2014-01-04 DIAGNOSIS — Z79899 Other long term (current) drug therapy: Secondary | ICD-10-CM | POA: Diagnosis not present

## 2014-01-04 DIAGNOSIS — I2789 Other specified pulmonary heart diseases: Secondary | ICD-10-CM | POA: Diagnosis not present

## 2014-01-04 DIAGNOSIS — Z940 Kidney transplant status: Secondary | ICD-10-CM | POA: Diagnosis not present

## 2014-01-04 DIAGNOSIS — Z09 Encounter for follow-up examination after completed treatment for conditions other than malignant neoplasm: Secondary | ICD-10-CM | POA: Diagnosis not present

## 2014-01-04 DIAGNOSIS — Z944 Liver transplant status: Secondary | ICD-10-CM | POA: Diagnosis not present

## 2014-01-04 DIAGNOSIS — E46 Unspecified protein-calorie malnutrition: Secondary | ICD-10-CM | POA: Diagnosis not present

## 2014-01-04 DIAGNOSIS — R569 Unspecified convulsions: Secondary | ICD-10-CM | POA: Diagnosis not present

## 2014-01-04 DIAGNOSIS — Z48815 Encounter for surgical aftercare following surgery on the digestive system: Secondary | ICD-10-CM | POA: Diagnosis not present

## 2014-01-09 DIAGNOSIS — Z48814 Encounter for surgical aftercare following surgery on the teeth or oral cavity: Secondary | ICD-10-CM

## 2014-01-09 DIAGNOSIS — I2789 Other specified pulmonary heart diseases: Secondary | ICD-10-CM

## 2014-01-09 DIAGNOSIS — R569 Unspecified convulsions: Secondary | ICD-10-CM | POA: Diagnosis not present

## 2014-01-09 DIAGNOSIS — E46 Unspecified protein-calorie malnutrition: Secondary | ICD-10-CM | POA: Diagnosis not present

## 2014-01-09 DIAGNOSIS — Z48815 Encounter for surgical aftercare following surgery on the digestive system: Secondary | ICD-10-CM | POA: Diagnosis not present

## 2014-01-09 DIAGNOSIS — Z944 Liver transplant status: Secondary | ICD-10-CM

## 2014-01-11 DIAGNOSIS — I2789 Other specified pulmonary heart diseases: Secondary | ICD-10-CM | POA: Diagnosis not present

## 2014-01-11 DIAGNOSIS — E46 Unspecified protein-calorie malnutrition: Secondary | ICD-10-CM | POA: Diagnosis not present

## 2014-01-11 DIAGNOSIS — Z09 Encounter for follow-up examination after completed treatment for conditions other than malignant neoplasm: Secondary | ICD-10-CM | POA: Diagnosis not present

## 2014-01-11 DIAGNOSIS — Z79899 Other long term (current) drug therapy: Secondary | ICD-10-CM | POA: Diagnosis not present

## 2014-01-11 DIAGNOSIS — R079 Chest pain, unspecified: Secondary | ICD-10-CM | POA: Diagnosis not present

## 2014-01-11 DIAGNOSIS — Z940 Kidney transplant status: Secondary | ICD-10-CM | POA: Diagnosis not present

## 2014-01-11 DIAGNOSIS — Z944 Liver transplant status: Secondary | ICD-10-CM | POA: Diagnosis not present

## 2014-01-11 DIAGNOSIS — R569 Unspecified convulsions: Secondary | ICD-10-CM | POA: Diagnosis not present

## 2014-01-11 DIAGNOSIS — Z48815 Encounter for surgical aftercare following surgery on the digestive system: Secondary | ICD-10-CM | POA: Diagnosis not present

## 2014-01-14 DIAGNOSIS — Z79899 Other long term (current) drug therapy: Secondary | ICD-10-CM | POA: Diagnosis not present

## 2014-01-14 DIAGNOSIS — Z09 Encounter for follow-up examination after completed treatment for conditions other than malignant neoplasm: Secondary | ICD-10-CM | POA: Diagnosis not present

## 2014-01-14 DIAGNOSIS — Z944 Liver transplant status: Secondary | ICD-10-CM | POA: Diagnosis not present

## 2014-01-14 DIAGNOSIS — R569 Unspecified convulsions: Secondary | ICD-10-CM | POA: Diagnosis not present

## 2014-01-14 DIAGNOSIS — E46 Unspecified protein-calorie malnutrition: Secondary | ICD-10-CM | POA: Diagnosis not present

## 2014-01-14 DIAGNOSIS — R079 Chest pain, unspecified: Secondary | ICD-10-CM | POA: Diagnosis not present

## 2014-01-14 DIAGNOSIS — Z48815 Encounter for surgical aftercare following surgery on the digestive system: Secondary | ICD-10-CM | POA: Diagnosis not present

## 2014-01-14 DIAGNOSIS — Z940 Kidney transplant status: Secondary | ICD-10-CM | POA: Diagnosis not present

## 2014-01-14 DIAGNOSIS — I2789 Other specified pulmonary heart diseases: Secondary | ICD-10-CM | POA: Diagnosis not present

## 2014-01-18 DIAGNOSIS — K651 Peritoneal abscess: Secondary | ICD-10-CM | POA: Diagnosis not present

## 2014-01-18 DIAGNOSIS — Z944 Liver transplant status: Secondary | ICD-10-CM | POA: Diagnosis not present

## 2014-01-18 DIAGNOSIS — Q859 Phakomatosis, unspecified: Secondary | ICD-10-CM | POA: Diagnosis not present

## 2014-01-18 DIAGNOSIS — K668 Other specified disorders of peritoneum: Secondary | ICD-10-CM | POA: Diagnosis not present

## 2014-01-18 DIAGNOSIS — IMO0002 Reserved for concepts with insufficient information to code with codable children: Secondary | ICD-10-CM | POA: Diagnosis not present

## 2014-01-19 DIAGNOSIS — E46 Unspecified protein-calorie malnutrition: Secondary | ICD-10-CM | POA: Diagnosis not present

## 2014-01-19 DIAGNOSIS — Z944 Liver transplant status: Secondary | ICD-10-CM | POA: Diagnosis not present

## 2014-01-19 DIAGNOSIS — Z48815 Encounter for surgical aftercare following surgery on the digestive system: Secondary | ICD-10-CM | POA: Diagnosis not present

## 2014-01-19 DIAGNOSIS — I2789 Other specified pulmonary heart diseases: Secondary | ICD-10-CM | POA: Diagnosis not present

## 2014-01-19 DIAGNOSIS — R569 Unspecified convulsions: Secondary | ICD-10-CM | POA: Diagnosis not present

## 2014-01-20 DIAGNOSIS — I2789 Other specified pulmonary heart diseases: Secondary | ICD-10-CM | POA: Diagnosis not present

## 2014-01-20 DIAGNOSIS — E46 Unspecified protein-calorie malnutrition: Secondary | ICD-10-CM | POA: Diagnosis not present

## 2014-01-20 DIAGNOSIS — Z48815 Encounter for surgical aftercare following surgery on the digestive system: Secondary | ICD-10-CM | POA: Diagnosis not present

## 2014-01-20 DIAGNOSIS — Z944 Liver transplant status: Secondary | ICD-10-CM | POA: Diagnosis not present

## 2014-01-20 DIAGNOSIS — R569 Unspecified convulsions: Secondary | ICD-10-CM | POA: Diagnosis not present

## 2014-01-22 DIAGNOSIS — Z48815 Encounter for surgical aftercare following surgery on the digestive system: Secondary | ICD-10-CM | POA: Diagnosis not present

## 2014-01-22 DIAGNOSIS — I2789 Other specified pulmonary heart diseases: Secondary | ICD-10-CM | POA: Diagnosis not present

## 2014-01-22 DIAGNOSIS — R079 Chest pain, unspecified: Secondary | ICD-10-CM | POA: Diagnosis not present

## 2014-01-22 DIAGNOSIS — Z944 Liver transplant status: Secondary | ICD-10-CM | POA: Diagnosis not present

## 2014-01-22 DIAGNOSIS — Z09 Encounter for follow-up examination after completed treatment for conditions other than malignant neoplasm: Secondary | ICD-10-CM | POA: Diagnosis not present

## 2014-01-22 DIAGNOSIS — E46 Unspecified protein-calorie malnutrition: Secondary | ICD-10-CM | POA: Diagnosis not present

## 2014-01-22 DIAGNOSIS — R569 Unspecified convulsions: Secondary | ICD-10-CM | POA: Diagnosis not present

## 2014-01-22 DIAGNOSIS — Z940 Kidney transplant status: Secondary | ICD-10-CM | POA: Diagnosis not present

## 2014-01-22 DIAGNOSIS — Z79899 Other long term (current) drug therapy: Secondary | ICD-10-CM | POA: Diagnosis not present

## 2014-01-25 DIAGNOSIS — R569 Unspecified convulsions: Secondary | ICD-10-CM | POA: Diagnosis not present

## 2014-01-25 DIAGNOSIS — R079 Chest pain, unspecified: Secondary | ICD-10-CM | POA: Diagnosis not present

## 2014-01-25 DIAGNOSIS — Z79899 Other long term (current) drug therapy: Secondary | ICD-10-CM | POA: Diagnosis not present

## 2014-01-25 DIAGNOSIS — I2789 Other specified pulmonary heart diseases: Secondary | ICD-10-CM | POA: Diagnosis not present

## 2014-01-25 DIAGNOSIS — E46 Unspecified protein-calorie malnutrition: Secondary | ICD-10-CM | POA: Diagnosis not present

## 2014-01-25 DIAGNOSIS — Z09 Encounter for follow-up examination after completed treatment for conditions other than malignant neoplasm: Secondary | ICD-10-CM | POA: Diagnosis not present

## 2014-01-25 DIAGNOSIS — Z944 Liver transplant status: Secondary | ICD-10-CM | POA: Diagnosis not present

## 2014-01-25 DIAGNOSIS — Z48815 Encounter for surgical aftercare following surgery on the digestive system: Secondary | ICD-10-CM | POA: Diagnosis not present

## 2014-01-25 DIAGNOSIS — Z940 Kidney transplant status: Secondary | ICD-10-CM | POA: Diagnosis not present

## 2014-01-28 DIAGNOSIS — Z79899 Other long term (current) drug therapy: Secondary | ICD-10-CM | POA: Diagnosis not present

## 2014-01-28 DIAGNOSIS — Z944 Liver transplant status: Secondary | ICD-10-CM | POA: Diagnosis not present

## 2014-01-28 DIAGNOSIS — R569 Unspecified convulsions: Secondary | ICD-10-CM | POA: Diagnosis not present

## 2014-01-28 DIAGNOSIS — Z794 Long term (current) use of insulin: Secondary | ICD-10-CM | POA: Diagnosis not present

## 2014-01-28 DIAGNOSIS — Z940 Kidney transplant status: Secondary | ICD-10-CM | POA: Diagnosis not present

## 2014-01-28 DIAGNOSIS — R079 Chest pain, unspecified: Secondary | ICD-10-CM | POA: Diagnosis not present

## 2014-01-28 DIAGNOSIS — Z48815 Encounter for surgical aftercare following surgery on the digestive system: Secondary | ICD-10-CM | POA: Diagnosis not present

## 2014-01-28 DIAGNOSIS — E46 Unspecified protein-calorie malnutrition: Secondary | ICD-10-CM | POA: Diagnosis not present

## 2014-01-28 DIAGNOSIS — Z09 Encounter for follow-up examination after completed treatment for conditions other than malignant neoplasm: Secondary | ICD-10-CM | POA: Diagnosis not present

## 2014-01-28 DIAGNOSIS — I2789 Other specified pulmonary heart diseases: Secondary | ICD-10-CM | POA: Diagnosis not present

## 2014-01-29 DIAGNOSIS — R188 Other ascites: Secondary | ICD-10-CM | POA: Diagnosis not present

## 2014-01-29 DIAGNOSIS — Z944 Liver transplant status: Secondary | ICD-10-CM | POA: Diagnosis not present

## 2014-02-01 DIAGNOSIS — Z48815 Encounter for surgical aftercare following surgery on the digestive system: Secondary | ICD-10-CM | POA: Diagnosis not present

## 2014-02-01 DIAGNOSIS — Z940 Kidney transplant status: Secondary | ICD-10-CM | POA: Diagnosis not present

## 2014-02-01 DIAGNOSIS — R079 Chest pain, unspecified: Secondary | ICD-10-CM | POA: Diagnosis not present

## 2014-02-01 DIAGNOSIS — Z79899 Other long term (current) drug therapy: Secondary | ICD-10-CM | POA: Diagnosis not present

## 2014-02-01 DIAGNOSIS — I2789 Other specified pulmonary heart diseases: Secondary | ICD-10-CM | POA: Diagnosis not present

## 2014-02-01 DIAGNOSIS — R569 Unspecified convulsions: Secondary | ICD-10-CM | POA: Diagnosis not present

## 2014-02-01 DIAGNOSIS — Z09 Encounter for follow-up examination after completed treatment for conditions other than malignant neoplasm: Secondary | ICD-10-CM | POA: Diagnosis not present

## 2014-02-01 DIAGNOSIS — E46 Unspecified protein-calorie malnutrition: Secondary | ICD-10-CM | POA: Diagnosis not present

## 2014-02-01 DIAGNOSIS — Z944 Liver transplant status: Secondary | ICD-10-CM | POA: Diagnosis not present

## 2014-02-02 ENCOUNTER — Telehealth: Payer: Self-pay | Admitting: General Practice

## 2014-02-02 ENCOUNTER — Ambulatory Visit (INDEPENDENT_AMBULATORY_CARE_PROVIDER_SITE_OTHER): Payer: Medicare Other

## 2014-02-02 ENCOUNTER — Encounter: Payer: Self-pay | Admitting: General Practice

## 2014-02-02 ENCOUNTER — Ambulatory Visit (INDEPENDENT_AMBULATORY_CARE_PROVIDER_SITE_OTHER): Payer: Medicare Other | Admitting: General Practice

## 2014-02-02 VITALS — BP 107/70 | HR 78 | Temp 98.2°F | Ht 65.0 in | Wt 146.6 lb

## 2014-02-02 DIAGNOSIS — M5430 Sciatica, unspecified side: Secondary | ICD-10-CM

## 2014-02-02 DIAGNOSIS — Z944 Liver transplant status: Secondary | ICD-10-CM | POA: Diagnosis not present

## 2014-02-02 DIAGNOSIS — M5432 Sciatica, left side: Secondary | ICD-10-CM

## 2014-02-02 MED ORDER — PREDNISONE (PAK) 10 MG PO TABS: ORAL_TABLET | ORAL | Status: DC

## 2014-02-02 MED ORDER — METHYLPREDNISOLONE ACETATE 80 MG/ML IJ SUSP
80.0000 mg | Freq: Once | INTRAMUSCULAR | Status: AC
  Administered 2014-02-02: 80 mg via INTRAMUSCULAR

## 2014-02-02 NOTE — Patient Instructions (Signed)
Sciatica °Sciatica is pain, weakness, numbness, or tingling along the path of the sciatic nerve. The nerve starts in the lower back and runs down the back of each leg. The nerve controls the muscles in the lower leg and in the back of the knee, while also providing sensation to the back of the thigh, lower leg, and the sole of your foot. Sciatica is a symptom of another medical condition. For instance, nerve damage or certain conditions, such as a herniated disk or bone spur on the spine, pinch or put pressure on the sciatic nerve. This causes the pain, weakness, or other sensations normally associated with sciatica. Generally, sciatica only affects one side of the body. °CAUSES  °· Herniated or slipped disc. °· Degenerative disk disease. °· A pain disorder involving the narrow muscle in the buttocks (piriformis syndrome). °· Pelvic injury or fracture. °· Pregnancy. °· Tumor (rare). °SYMPTOMS  °Symptoms can vary from mild to very severe. The symptoms usually travel from the low back to the buttocks and down the back of the leg. Symptoms can include: °· Mild tingling or dull aches in the lower back, leg, or hip. °· Numbness in the back of the calf or sole of the foot. °· Burning sensations in the lower back, leg, or hip. °· Sharp pains in the lower back, leg, or hip. °· Leg weakness. °· Severe back pain inhibiting movement. °These symptoms may get worse with coughing, sneezing, laughing, or prolonged sitting or standing. Also, being overweight may worsen symptoms. °DIAGNOSIS  °Your caregiver will perform a physical exam to look for common symptoms of sciatica. He or she may ask you to do certain movements or activities that would trigger sciatic nerve pain. Other tests may be performed to find the cause of the sciatica. These may include: °· Blood tests. °· X-rays. °· Imaging tests, such as an MRI or CT scan. °TREATMENT  °Treatment is directed at the cause of the sciatic pain. Sometimes, treatment is not necessary  and the pain and discomfort goes away on its own. If treatment is needed, your caregiver may suggest: °· Over-the-counter medicines to relieve pain. °· Prescription medicines, such as anti-inflammatory medicine, muscle relaxants, or narcotics. °· Applying heat or ice to the painful area. °· Steroid injections to lessen pain, irritation, and inflammation around the nerve. °· Reducing activity during periods of pain. °· Exercising and stretching to strengthen your abdomen and improve flexibility of your spine. Your caregiver may suggest losing weight if the extra weight makes the back pain worse. °· Physical therapy. °· Surgery to eliminate what is pressing or pinching the nerve, such as a bone spur or part of a herniated disk. °HOME CARE INSTRUCTIONS  °· Only take over-the-counter or prescription medicines for pain or discomfort as directed by your caregiver. °· Apply ice to the affected area for 20 minutes, 3 4 times a day for the first 48 72 hours. Then try heat in the same way. °· Exercise, stretch, or perform your usual activities if these do not aggravate your pain. °· Attend physical therapy sessions as directed by your caregiver. °· Keep all follow-up appointments as directed by your caregiver. °· Do not wear high heels or shoes that do not provide proper support. °· Check your mattress to see if it is too soft. A firm mattress may lessen your pain and discomfort. °SEEK IMMEDIATE MEDICAL CARE IF:  °· You lose control of your bowel or bladder (incontinence). °· You have increasing weakness in the lower back,   pelvis, buttocks, or legs. °· You have redness or swelling of your back. °· You have a burning sensation when you urinate. °· You have pain that gets worse when you lie down or awakens you at night. °· Your pain is worse than you have experienced in the past. °· Your pain is lasting longer than 4 weeks. °· You are suddenly losing weight without reason. °MAKE SURE YOU: °· Understand these  instructions. °· Will watch your condition. °· Will get help right away if you are not doing well or get worse. °Document Released: 10/23/2001 Document Revised: 04/29/2012 Document Reviewed: 03/09/2012 °ExitCare® Patient Information ©2014 [employer]. ° °

## 2014-02-02 NOTE — Progress Notes (Signed)
   Subjective:    Patient ID: Cassidy Barber, female    DOB: [birth year], 63 y.o.   MRN: [id number]  Back Pain This is a new problem. The current episode started in the past 7 days. The problem occurs intermittently. The problem is unchanged. The pain is present in the lumbar spine. The quality of the pain is described as aching and burning. The pain radiates to the left thigh and left knee. The pain is at a severity of 6/10. The pain is the same all the time. The symptoms are aggravated by standing, sitting and position. Pertinent negatives include no bladder incontinence, bowel incontinence, chest pain, numbness, tingling or weakness. Risk factors include history of steroid use. She has tried nothing for the symptoms.  History of liver transplant January 2015.      Review of Systems  Constitutional: Negative for chills and fatigue.  Respiratory: Negative for chest tightness and shortness of breath.   Cardiovascular: Negative for chest pain and palpitations.  Gastrointestinal: Negative for bowel incontinence.  Genitourinary: Negative for bladder incontinence.  Musculoskeletal: Positive for back pain.  Neurological: Negative for dizziness, tingling, weakness and numbness.       Objective:   Physical Exam  Constitutional: She is oriented to person, place, and time. She appears well-developed and well-nourished.  Cardiovascular: Normal rate, regular rhythm and normal heart sounds.   Pulmonary/Chest: Effort normal and breath sounds normal. No respiratory distress. She exhibits no tenderness.  Abdominal: Soft. Bowel sounds are normal. She exhibits no distension. There is no tenderness.  Neurological: She is alert and oriented to person, place, and time.  Skin: Skin is warm and dry.  Psychiatric: She has a normal mood and affect.     [facility] reading (PRIMARY) by Erby Pian, FNP-C, degenerative disc disease.      Assessment & Plan:  1. Left sided sciatica -Provider spoke with  Lanelle Bal, the transplant coordinator at [place], to discuss steroid treatment for sciatica. - DG Lumbar Spine Complete; Future - predniSONE (STERAPRED UNI-PAK) 10 MG tablet; Take as directed  Dispense: 21 tablet; Refill: 0 - methylPREDNISolone acetate (DEPO-MEDROL) injection 80 mg; Inject 1 mL (80 mg total) into the muscle once. -provided and discussed sciatica and home care instructions -RTO if symptoms worsen or unresolved, will refer to ortho May seek emergency medical treatment Patient verbalized understanding Erby Pian, FNP-C

## 2014-02-02 NOTE — Telephone Encounter (Signed)
appt at 11:10 with mae

## 2014-02-04 DIAGNOSIS — N269 Renal sclerosis, unspecified: Secondary | ICD-10-CM | POA: Diagnosis not present

## 2014-02-04 DIAGNOSIS — Z79899 Other long term (current) drug therapy: Secondary | ICD-10-CM | POA: Diagnosis not present

## 2014-02-04 DIAGNOSIS — Z09 Encounter for follow-up examination after completed treatment for conditions other than malignant neoplasm: Secondary | ICD-10-CM | POA: Diagnosis not present

## 2014-02-04 DIAGNOSIS — Z85038 Personal history of other malignant neoplasm of large intestine: Secondary | ICD-10-CM | POA: Diagnosis not present

## 2014-02-04 DIAGNOSIS — K7460 Unspecified cirrhosis of liver: Secondary | ICD-10-CM | POA: Diagnosis not present

## 2014-02-04 DIAGNOSIS — Z944 Liver transplant status: Secondary | ICD-10-CM | POA: Insufficient documentation

## 2014-02-04 DIAGNOSIS — Z940 Kidney transplant status: Secondary | ICD-10-CM | POA: Diagnosis not present

## 2014-02-04 DIAGNOSIS — E46 Unspecified protein-calorie malnutrition: Secondary | ICD-10-CM | POA: Diagnosis not present

## 2014-02-04 DIAGNOSIS — I2789 Other specified pulmonary heart diseases: Secondary | ICD-10-CM | POA: Diagnosis not present

## 2014-02-04 DIAGNOSIS — Z48815 Encounter for surgical aftercare following surgery on the digestive system: Secondary | ICD-10-CM | POA: Diagnosis not present

## 2014-02-04 DIAGNOSIS — Z5181 Encounter for therapeutic drug level monitoring: Secondary | ICD-10-CM | POA: Diagnosis not present

## 2014-02-04 DIAGNOSIS — R569 Unspecified convulsions: Secondary | ICD-10-CM | POA: Diagnosis not present

## 2014-02-08 DIAGNOSIS — Z944 Liver transplant status: Secondary | ICD-10-CM | POA: Diagnosis not present

## 2014-02-08 DIAGNOSIS — Z298 Encounter for other specified prophylactic measures: Secondary | ICD-10-CM | POA: Diagnosis not present

## 2014-02-11 DIAGNOSIS — E46 Unspecified protein-calorie malnutrition: Secondary | ICD-10-CM | POA: Diagnosis not present

## 2014-02-11 DIAGNOSIS — Z09 Encounter for follow-up examination after completed treatment for conditions other than malignant neoplasm: Secondary | ICD-10-CM | POA: Diagnosis not present

## 2014-02-11 DIAGNOSIS — Z48815 Encounter for surgical aftercare following surgery on the digestive system: Secondary | ICD-10-CM | POA: Diagnosis not present

## 2014-02-11 DIAGNOSIS — Z944 Liver transplant status: Secondary | ICD-10-CM | POA: Diagnosis not present

## 2014-02-11 DIAGNOSIS — Z940 Kidney transplant status: Secondary | ICD-10-CM | POA: Diagnosis not present

## 2014-02-11 DIAGNOSIS — Z794 Long term (current) use of insulin: Secondary | ICD-10-CM | POA: Diagnosis not present

## 2014-02-11 DIAGNOSIS — I2789 Other specified pulmonary heart diseases: Secondary | ICD-10-CM | POA: Diagnosis not present

## 2014-02-11 DIAGNOSIS — K7460 Unspecified cirrhosis of liver: Secondary | ICD-10-CM | POA: Diagnosis not present

## 2014-02-11 DIAGNOSIS — Z85038 Personal history of other malignant neoplasm of large intestine: Secondary | ICD-10-CM | POA: Diagnosis not present

## 2014-02-11 DIAGNOSIS — Z79899 Other long term (current) drug therapy: Secondary | ICD-10-CM | POA: Diagnosis not present

## 2014-02-11 DIAGNOSIS — R569 Unspecified convulsions: Secondary | ICD-10-CM | POA: Diagnosis not present

## 2014-02-15 DIAGNOSIS — R569 Unspecified convulsions: Secondary | ICD-10-CM | POA: Diagnosis not present

## 2014-02-15 DIAGNOSIS — E46 Unspecified protein-calorie malnutrition: Secondary | ICD-10-CM | POA: Diagnosis not present

## 2014-02-15 DIAGNOSIS — Z79899 Other long term (current) drug therapy: Secondary | ICD-10-CM | POA: Diagnosis not present

## 2014-02-15 DIAGNOSIS — Z944 Liver transplant status: Secondary | ICD-10-CM | POA: Diagnosis not present

## 2014-02-15 DIAGNOSIS — Z48815 Encounter for surgical aftercare following surgery on the digestive system: Secondary | ICD-10-CM | POA: Diagnosis not present

## 2014-02-15 DIAGNOSIS — Z09 Encounter for follow-up examination after completed treatment for conditions other than malignant neoplasm: Secondary | ICD-10-CM | POA: Diagnosis not present

## 2014-02-15 DIAGNOSIS — Z940 Kidney transplant status: Secondary | ICD-10-CM | POA: Diagnosis not present

## 2014-02-15 DIAGNOSIS — I2789 Other specified pulmonary heart diseases: Secondary | ICD-10-CM | POA: Diagnosis not present

## 2014-02-16 ENCOUNTER — Telehealth: Payer: Self-pay | Admitting: General Practice

## 2014-02-16 DIAGNOSIS — M5430 Sciatica, unspecified side: Secondary | ICD-10-CM

## 2014-02-17 ENCOUNTER — Other Ambulatory Visit: Payer: Self-pay | Admitting: General Practice

## 2014-02-17 NOTE — Telephone Encounter (Signed)
Needs ortho referral if still having pain. Any preference in location?

## 2014-02-17 NOTE — Telephone Encounter (Signed)
Left message on voicemail that we would not be able to provide pain medication and that a referral to an orthopedist is appropriate. It appears that this option was discussed at her last appointment. Referral placed. Asked patient to call the referral department at [phone] if she had a preference for which location or provider. Otherwise we typically use [facility].

## 2014-02-18 DIAGNOSIS — Z5181 Encounter for therapeutic drug level monitoring: Secondary | ICD-10-CM | POA: Diagnosis not present

## 2014-02-18 DIAGNOSIS — Z940 Kidney transplant status: Secondary | ICD-10-CM | POA: Diagnosis not present

## 2014-02-18 DIAGNOSIS — Z09 Encounter for follow-up examination after completed treatment for conditions other than malignant neoplasm: Secondary | ICD-10-CM | POA: Diagnosis not present

## 2014-02-18 DIAGNOSIS — Z79899 Other long term (current) drug therapy: Secondary | ICD-10-CM | POA: Diagnosis not present

## 2014-02-18 DIAGNOSIS — Z48815 Encounter for surgical aftercare following surgery on the digestive system: Secondary | ICD-10-CM | POA: Diagnosis not present

## 2014-02-18 DIAGNOSIS — E46 Unspecified protein-calorie malnutrition: Secondary | ICD-10-CM | POA: Diagnosis not present

## 2014-02-18 DIAGNOSIS — I2789 Other specified pulmonary heart diseases: Secondary | ICD-10-CM | POA: Diagnosis not present

## 2014-02-18 DIAGNOSIS — Z944 Liver transplant status: Secondary | ICD-10-CM | POA: Diagnosis not present

## 2014-02-18 DIAGNOSIS — R569 Unspecified convulsions: Secondary | ICD-10-CM | POA: Diagnosis not present

## 2014-02-22 DIAGNOSIS — M549 Dorsalgia, unspecified: Secondary | ICD-10-CM | POA: Diagnosis not present

## 2014-02-22 DIAGNOSIS — R935 Abnormal findings on diagnostic imaging of other abdominal regions, including retroperitoneum: Secondary | ICD-10-CM | POA: Diagnosis not present

## 2014-02-22 DIAGNOSIS — K7460 Unspecified cirrhosis of liver: Secondary | ICD-10-CM | POA: Diagnosis not present

## 2014-02-22 DIAGNOSIS — Z944 Liver transplant status: Secondary | ICD-10-CM | POA: Diagnosis not present

## 2014-02-22 DIAGNOSIS — Z2989 Encounter for other specified prophylactic measures: Secondary | ICD-10-CM | POA: Diagnosis not present

## 2014-02-22 DIAGNOSIS — Z298 Encounter for other specified prophylactic measures: Secondary | ICD-10-CM | POA: Diagnosis not present

## 2014-02-22 DIAGNOSIS — K6389 Other specified diseases of intestine: Secondary | ICD-10-CM | POA: Diagnosis not present

## 2014-02-22 DIAGNOSIS — K560 Paralytic ileus: Secondary | ICD-10-CM | POA: Diagnosis not present

## 2014-02-22 DIAGNOSIS — R188 Other ascites: Secondary | ICD-10-CM | POA: Diagnosis not present

## 2014-02-25 DIAGNOSIS — I2789 Other specified pulmonary heart diseases: Secondary | ICD-10-CM | POA: Diagnosis not present

## 2014-02-25 DIAGNOSIS — Z944 Liver transplant status: Secondary | ICD-10-CM | POA: Diagnosis not present

## 2014-02-25 DIAGNOSIS — Z48815 Encounter for surgical aftercare following surgery on the digestive system: Secondary | ICD-10-CM | POA: Diagnosis not present

## 2014-02-25 DIAGNOSIS — R569 Unspecified convulsions: Secondary | ICD-10-CM | POA: Diagnosis not present

## 2014-02-25 DIAGNOSIS — E46 Unspecified protein-calorie malnutrition: Secondary | ICD-10-CM | POA: Diagnosis not present

## 2014-03-01 DIAGNOSIS — R569 Unspecified convulsions: Secondary | ICD-10-CM | POA: Diagnosis not present

## 2014-03-01 DIAGNOSIS — I2789 Other specified pulmonary heart diseases: Secondary | ICD-10-CM | POA: Diagnosis not present

## 2014-03-01 DIAGNOSIS — E46 Unspecified protein-calorie malnutrition: Secondary | ICD-10-CM | POA: Diagnosis not present

## 2014-03-01 DIAGNOSIS — Z944 Liver transplant status: Secondary | ICD-10-CM | POA: Diagnosis not present

## 2014-03-01 DIAGNOSIS — Z48815 Encounter for surgical aftercare following surgery on the digestive system: Secondary | ICD-10-CM | POA: Diagnosis not present

## 2014-03-01 DIAGNOSIS — Z5181 Encounter for therapeutic drug level monitoring: Secondary | ICD-10-CM | POA: Diagnosis not present

## 2014-03-01 DIAGNOSIS — Z4801 Encounter for change or removal of surgical wound dressing: Secondary | ICD-10-CM | POA: Diagnosis not present

## 2014-03-08 DIAGNOSIS — R7989 Other specified abnormal findings of blood chemistry: Secondary | ICD-10-CM | POA: Diagnosis not present

## 2014-03-08 DIAGNOSIS — R188 Other ascites: Secondary | ICD-10-CM | POA: Diagnosis not present

## 2014-03-08 DIAGNOSIS — Z298 Encounter for other specified prophylactic measures: Secondary | ICD-10-CM | POA: Diagnosis not present

## 2014-03-08 DIAGNOSIS — K7689 Other specified diseases of liver: Secondary | ICD-10-CM | POA: Diagnosis not present

## 2014-03-08 DIAGNOSIS — R935 Abnormal findings on diagnostic imaging of other abdominal regions, including retroperitoneum: Secondary | ICD-10-CM | POA: Diagnosis not present

## 2014-03-08 DIAGNOSIS — Z944 Liver transplant status: Secondary | ICD-10-CM | POA: Diagnosis not present

## 2014-03-15 DIAGNOSIS — I2789 Other specified pulmonary heart diseases: Secondary | ICD-10-CM | POA: Diagnosis not present

## 2014-03-15 DIAGNOSIS — Z09 Encounter for follow-up examination after completed treatment for conditions other than malignant neoplasm: Secondary | ICD-10-CM | POA: Diagnosis not present

## 2014-03-15 DIAGNOSIS — R569 Unspecified convulsions: Secondary | ICD-10-CM | POA: Diagnosis not present

## 2014-03-15 DIAGNOSIS — E46 Unspecified protein-calorie malnutrition: Secondary | ICD-10-CM | POA: Diagnosis not present

## 2014-03-15 DIAGNOSIS — Z48815 Encounter for surgical aftercare following surgery on the digestive system: Secondary | ICD-10-CM | POA: Diagnosis not present

## 2014-03-15 DIAGNOSIS — Z944 Liver transplant status: Secondary | ICD-10-CM | POA: Diagnosis not present

## 2014-03-15 DIAGNOSIS — R7989 Other specified abnormal findings of blood chemistry: Secondary | ICD-10-CM | POA: Diagnosis not present

## 2014-03-15 DIAGNOSIS — Z5181 Encounter for therapeutic drug level monitoring: Secondary | ICD-10-CM | POA: Diagnosis not present

## 2014-03-15 DIAGNOSIS — T8640 Unspecified complication of liver transplant: Secondary | ICD-10-CM | POA: Diagnosis not present

## 2014-03-15 DIAGNOSIS — K319 Disease of stomach and duodenum, unspecified: Secondary | ICD-10-CM | POA: Diagnosis not present

## 2014-03-15 DIAGNOSIS — Z79899 Other long term (current) drug therapy: Secondary | ICD-10-CM | POA: Diagnosis not present

## 2014-03-15 DIAGNOSIS — Z48298 Encounter for aftercare following other organ transplant: Secondary | ICD-10-CM | POA: Diagnosis not present

## 2014-03-15 DIAGNOSIS — K929 Disease of digestive system, unspecified: Secondary | ICD-10-CM | POA: Diagnosis not present

## 2014-03-15 DIAGNOSIS — K831 Obstruction of bile duct: Secondary | ICD-10-CM | POA: Diagnosis not present

## 2014-03-18 DIAGNOSIS — R569 Unspecified convulsions: Secondary | ICD-10-CM | POA: Diagnosis not present

## 2014-03-18 DIAGNOSIS — Z09 Encounter for follow-up examination after completed treatment for conditions other than malignant neoplasm: Secondary | ICD-10-CM | POA: Diagnosis not present

## 2014-03-18 DIAGNOSIS — E46 Unspecified protein-calorie malnutrition: Secondary | ICD-10-CM | POA: Diagnosis not present

## 2014-03-18 DIAGNOSIS — Z5181 Encounter for therapeutic drug level monitoring: Secondary | ICD-10-CM | POA: Diagnosis not present

## 2014-03-18 DIAGNOSIS — Z48815 Encounter for surgical aftercare following surgery on the digestive system: Secondary | ICD-10-CM | POA: Diagnosis not present

## 2014-03-18 DIAGNOSIS — I2789 Other specified pulmonary heart diseases: Secondary | ICD-10-CM | POA: Diagnosis not present

## 2014-03-18 DIAGNOSIS — Z944 Liver transplant status: Secondary | ICD-10-CM | POA: Diagnosis not present

## 2014-03-18 DIAGNOSIS — Z79899 Other long term (current) drug therapy: Secondary | ICD-10-CM | POA: Diagnosis not present

## 2014-03-22 DIAGNOSIS — R569 Unspecified convulsions: Secondary | ICD-10-CM | POA: Diagnosis not present

## 2014-03-22 DIAGNOSIS — I2789 Other specified pulmonary heart diseases: Secondary | ICD-10-CM | POA: Diagnosis not present

## 2014-03-25 DIAGNOSIS — E46 Unspecified protein-calorie malnutrition: Secondary | ICD-10-CM | POA: Diagnosis not present

## 2014-03-25 DIAGNOSIS — R569 Unspecified convulsions: Secondary | ICD-10-CM | POA: Diagnosis not present

## 2014-03-25 DIAGNOSIS — I2789 Other specified pulmonary heart diseases: Secondary | ICD-10-CM | POA: Diagnosis not present

## 2014-03-25 DIAGNOSIS — Z944 Liver transplant status: Secondary | ICD-10-CM | POA: Diagnosis not present

## 2014-03-25 DIAGNOSIS — Z48815 Encounter for surgical aftercare following surgery on the digestive system: Secondary | ICD-10-CM | POA: Diagnosis not present

## 2014-03-26 ENCOUNTER — Ambulatory Visit (INDEPENDENT_AMBULATORY_CARE_PROVIDER_SITE_OTHER): Payer: Medicare Other | Admitting: Family

## 2014-03-26 ENCOUNTER — Other Ambulatory Visit: Payer: Medicare Other

## 2014-03-26 ENCOUNTER — Ambulatory Visit (INDEPENDENT_AMBULATORY_CARE_PROVIDER_SITE_OTHER): Payer: Medicare Other

## 2014-03-26 VITALS — BP 131/70 | HR 100 | Temp 97.1°F | Ht 65.0 in | Wt 147.0 lb

## 2014-03-26 DIAGNOSIS — K5900 Constipation, unspecified: Secondary | ICD-10-CM | POA: Diagnosis not present

## 2014-03-26 DIAGNOSIS — K769 Liver disease, unspecified: Secondary | ICD-10-CM | POA: Diagnosis not present

## 2014-03-26 DIAGNOSIS — T8649 Other complications of liver transplant: Principal | ICD-10-CM

## 2014-03-26 DIAGNOSIS — T85590A Other mechanical complication of bile duct prosthesis, initial encounter: Secondary | ICD-10-CM

## 2014-03-26 DIAGNOSIS — T8640 Unspecified complication of liver transplant: Secondary | ICD-10-CM | POA: Diagnosis not present

## 2014-03-26 DIAGNOSIS — T85898A Other specified complication of other internal prosthetic devices, implants and grafts, initial encounter: Secondary | ICD-10-CM

## 2014-03-26 DIAGNOSIS — L299 Pruritus, unspecified: Secondary | ICD-10-CM

## 2014-03-26 DIAGNOSIS — Z944 Liver transplant status: Secondary | ICD-10-CM | POA: Diagnosis not present

## 2014-03-26 LAB — CMP14+EGFR
ALT: 39 IU/L — AB (ref 0–32)
AST: 52 IU/L — ABNORMAL HIGH (ref 0–40)
Albumin/Globulin Ratio: 1.2 (ref 1.1–2.5)
Albumin: 3.7 g/dL (ref 3.6–4.8)
Alkaline Phosphatase: 537 IU/L — ABNORMAL HIGH (ref 39–117)
BUN/Creatinine Ratio: 18 (ref 11–26)
BUN: 23 mg/dL (ref 8–27)
CALCIUM: 9.6 mg/dL (ref 8.7–10.3)
CHLORIDE: 100 mmol/L (ref 97–108)
CO2: 22 mmol/L (ref 18–29)
Creatinine, Ser: 1.26 mg/dL — ABNORMAL HIGH (ref 0.57–1.00)
GFR calc Af Amer: 53 mL/min/{1.73_m2} — ABNORMAL LOW (ref >59)
GFR, EST NON AFRICAN AMERICAN: 46 mL/min/{1.73_m2} — AB (ref >59)
GLUCOSE: 124 mg/dL — AB (ref 65–99)
Globulin, Total: 3.1 g/dL (ref 1.5–4.5)
POTASSIUM: 4.8 mmol/L (ref 3.5–5.2)
Sodium: 139 mmol/L (ref 134–144)
TOTAL PROTEIN: 6.8 g/dL (ref 6.0–8.5)
Total Bilirubin: 2.6 mg/dL — ABNORMAL HIGH (ref 0.0–1.2)

## 2014-03-26 LAB — MAGNESIUM: MAGNESIUM: 1.8 mg/dL (ref 1.6–2.6)

## 2014-03-26 LAB — PHOSPHORUS: Phosphorus: 4.3 mg/dL (ref 2.5–4.5)

## 2014-03-26 LAB — URIC ACID: URIC ACID: 8.8 mg/dL — AB (ref 2.5–7.1)

## 2014-03-26 MED ORDER — ALPRAZOLAM 0.25 MG PO TABS: 0.2500 mg | ORAL_TABLET | Freq: Two times a day (BID) | ORAL | Status: DC | PRN

## 2014-03-26 NOTE — Progress Notes (Signed)
   Subjective:    Patient ID: Cassidy Barber, female    DOB: 1951-07-15, 63 y.o.   MRN: [id number]  HPI Pt presents to office with severe itching over entire body that started two nights ago. Pt states she has "cramping" in your feet, legs and arms that started the same time. Pt had a liver transplant 11/19/13 and stent placement in her liver and pancrease on 03/15/14. Pt states she had the transplant in [facility] and has been in contact. Her MD is faxing over labs that they want drawn.    Review of Systems  All other systems reviewed and are negative.      Objective:   Physical Exam  Vitals reviewed. Constitutional: She is oriented to person, place, and time. She appears well-developed and well-nourished.  HENT:  Head: Normocephalic.  Right Ear: External ear normal.  Left Ear: External ear normal.  Nose: Nose normal.  Mouth/Throat: Oropharynx is clear and moist.  Eyes: Pupils are equal, round, and reactive to light.  Bilateral Scleral yellowish tint  Cardiovascular: Normal rate, regular rhythm, normal heart sounds and intact distal pulses.   No murmur heard. Pulmonary/Chest: Effort normal and breath sounds normal. No respiratory distress. She has no wheezes.  Abdominal: Soft. Bowel sounds are normal. There is tenderness (Mild tenderness in RUQ).  Musculoskeletal: Normal range of motion.  Neurological: She is alert and oriented to person, place, and time.  Skin: Skin is dry. There is erythema.  Pt constantly itching entire body  Psychiatric: Thought content normal. Her mood appears anxious.  Very tearful and anxious   Xray-Stents in place, pt's colon full of stool- Preliminary reading by [name], FNP [facility]   BP 131/70  Pulse 100  Temp(Src) 97.1 F (36.2 C) (Oral)  Ht 5\' 5"  (1.651 m)  Wt 147 lb (66.679 kg)  BMI 24.46 kg/m2      Assessment & Plan:  1. Hx of liver transplant - DG Abd 1 View; Future  2. Itching -Take RX of hydroxyzine on a schdue for the next few days  until we can get itching under control  3. Constipation -Prune juice -Miralax and stool softeners  Follow-up with [facility] asap X-ray and lab work faxed to [facility] (liver transplant clinic)  [name], FNP

## 2014-03-26 NOTE — Patient Instructions (Addendum)
Pruritus  Pruritis is an itch. There are many different problems that can cause an itch. Dry skin is one of the most common causes of itching. Most cases of itching do not require medical attention.  HOME CARE INSTRUCTIONS  Make sure your skin is moistened on a regular basis. A moisturizer that contains petroleum jelly is best for keeping moisture in your skin. If you develop a rash, you may try the following for relief:   Use corticosteroid cream.  Apply cool compresses to the affected areas.  Bathe with Epsom salts or baking soda in the bathwater.  Soak in colloidal oatmeal baths. These are available at your pharmacy.  Apply baking soda paste to the rash. Stir water into baking soda until it reaches a paste-like consistency.  Use an anti-itch lotion.  Take over-the-counter diphenhydramine medicine by mouth as the instructions direct.  Avoid scratching. Scratching may cause the rash to become infected. If itching is very bad, your caregiver may suggest prescription lotions or creams to lessen your symptoms.  Avoid hot showers, which can make itching worse. A cold shower may help with itching as long as you use a moisturizer after the shower. SEEK MEDICAL CARE IF: The itching does not go away after several days. Document Released: 07/11/2011 Document Revised: 01/21/2012 Document Reviewed: 07/11/2011 [facility] Patient Information 2014 [place], [place]. Constipation, Adult Constipation is when a person has fewer than 3 bowel movements a week; has difficulty having a bowel movement; or has stools that are dry, hard, or larger than normal. As people grow older, constipation is more common. If you try to fix constipation with medicines that make you have a bowel movement (laxatives), the problem may get worse. Long-term laxative use may cause the muscles of the colon to become weak. A low-fiber diet, not taking in enough fluids, and taking certain medicines may make constipation worse. CAUSES    Certain medicines, such as antidepressants, pain medicine, iron supplements, antacids, and water pills.   Certain diseases, such as diabetes, irritable bowel syndrome (IBS), thyroid disease, or depression.   Not drinking enough water.   Not eating enough fiber-rich foods.   Stress or travel.  Lack of physical activity or exercise.  Not going to the restroom when there is the urge to have a bowel movement.  Ignoring the urge to have a bowel movement.  Using laxatives too much. SYMPTOMS   Having fewer than 3 bowel movements a week.   Straining to have a bowel movement.   Having hard, dry, or larger than normal stools.   Feeling full or bloated.   Pain in the lower abdomen.  Not feeling relief after having a bowel movement. DIAGNOSIS  Your caregiver will take a medical history and perform a physical exam. Further testing may be done for severe constipation. Some tests may include:   A barium enema X-ray to examine your rectum, colon, and sometimes, your small intestine.  A sigmoidoscopy to examine your lower colon.  A colonoscopy to examine your entire colon. TREATMENT  Treatment will depend on the severity of your constipation and what is causing it. Some dietary treatments include drinking more fluids and eating more fiber-rich foods. Lifestyle treatments may include regular exercise. If these diet and lifestyle recommendations do not help, your caregiver may recommend taking over-the-counter laxative medicines to help you have bowel movements. Prescription medicines may be prescribed if over-the-counter medicines do not work.  HOME CARE INSTRUCTIONS   Increase dietary fiber in your diet, such as fruits, vegetables,  whole grains, and beans. Limit high-fat and processed sugars in your diet, such as [place] fries, hamburgers, cookies, candies, and soda.   A fiber supplement may be added to your diet if you cannot get enough fiber from foods.   Drink enough  fluids to keep your urine clear or pale yellow.   Exercise regularly or as directed by your caregiver.   Go to the restroom when you have the urge to go. Do not hold it.  Only take medicines as directed by your caregiver. Do not take other medicines for constipation without talking to your caregiver first. [place] IF:   You have bright red blood in your stool.   Your constipation lasts for more than 4 days or gets worse.   You have abdominal or rectal pain.   You have thin, pencil-like stools.  You have unexplained weight loss. MAKE SURE YOU:   Understand these instructions.  Will watch your condition.  Will get help right away if you are not doing well or get worse. Document Released: 07/27/2004 Document Revised: 01/21/2012 Document Reviewed: 08/10/2013 [facility] Patient Information 2014 [place], [place].

## 2014-03-29 DIAGNOSIS — E039 Hypothyroidism, unspecified: Secondary | ICD-10-CM | POA: Diagnosis not present

## 2014-03-29 DIAGNOSIS — R569 Unspecified convulsions: Secondary | ICD-10-CM | POA: Diagnosis not present

## 2014-03-29 DIAGNOSIS — I2789 Other specified pulmonary heart diseases: Secondary | ICD-10-CM | POA: Diagnosis not present

## 2014-03-29 DIAGNOSIS — Z5181 Encounter for therapeutic drug level monitoring: Secondary | ICD-10-CM | POA: Diagnosis not present

## 2014-03-29 DIAGNOSIS — Z79899 Other long term (current) drug therapy: Secondary | ICD-10-CM | POA: Diagnosis not present

## 2014-03-29 DIAGNOSIS — Z48815 Encounter for surgical aftercare following surgery on the digestive system: Secondary | ICD-10-CM | POA: Diagnosis not present

## 2014-03-29 DIAGNOSIS — Z944 Liver transplant status: Secondary | ICD-10-CM | POA: Diagnosis not present

## 2014-03-29 DIAGNOSIS — E46 Unspecified protein-calorie malnutrition: Secondary | ICD-10-CM | POA: Diagnosis not present

## 2014-03-30 DIAGNOSIS — Z48815 Encounter for surgical aftercare following surgery on the digestive system: Secondary | ICD-10-CM | POA: Diagnosis not present

## 2014-03-30 DIAGNOSIS — R569 Unspecified convulsions: Secondary | ICD-10-CM | POA: Diagnosis not present

## 2014-03-30 DIAGNOSIS — Z944 Liver transplant status: Secondary | ICD-10-CM | POA: Diagnosis not present

## 2014-03-30 DIAGNOSIS — I2789 Other specified pulmonary heart diseases: Secondary | ICD-10-CM | POA: Diagnosis not present

## 2014-03-30 DIAGNOSIS — E46 Unspecified protein-calorie malnutrition: Secondary | ICD-10-CM | POA: Diagnosis not present

## 2014-03-31 DIAGNOSIS — R7989 Other specified abnormal findings of blood chemistry: Secondary | ICD-10-CM | POA: Diagnosis not present

## 2014-04-01 DIAGNOSIS — T8640 Unspecified complication of liver transplant: Secondary | ICD-10-CM | POA: Diagnosis not present

## 2014-04-01 DIAGNOSIS — T85898A Other specified complication of other internal prosthetic devices, implants and grafts, initial encounter: Secondary | ICD-10-CM | POA: Diagnosis not present

## 2014-04-01 DIAGNOSIS — Z944 Liver transplant status: Secondary | ICD-10-CM | POA: Diagnosis not present

## 2014-04-01 DIAGNOSIS — I10 Essential (primary) hypertension: Secondary | ICD-10-CM | POA: Diagnosis not present

## 2014-04-01 DIAGNOSIS — E039 Hypothyroidism, unspecified: Secondary | ICD-10-CM | POA: Diagnosis not present

## 2014-04-01 DIAGNOSIS — Z48298 Encounter for aftercare following other organ transplant: Secondary | ICD-10-CM | POA: Diagnosis not present

## 2014-04-01 DIAGNOSIS — Z85038 Personal history of other malignant neoplasm of large intestine: Secondary | ICD-10-CM | POA: Diagnosis not present

## 2014-04-01 DIAGNOSIS — K838 Other specified diseases of biliary tract: Secondary | ICD-10-CM | POA: Diagnosis not present

## 2014-04-01 DIAGNOSIS — Z9089 Acquired absence of other organs: Secondary | ICD-10-CM | POA: Diagnosis not present

## 2014-04-01 DIAGNOSIS — K831 Obstruction of bile duct: Secondary | ICD-10-CM | POA: Diagnosis not present

## 2014-04-01 DIAGNOSIS — Z4682 Encounter for fitting and adjustment of non-vascular catheter: Secondary | ICD-10-CM | POA: Diagnosis not present

## 2014-04-01 DIAGNOSIS — Z7982 Long term (current) use of aspirin: Secondary | ICD-10-CM | POA: Diagnosis not present

## 2014-04-01 DIAGNOSIS — Z4659 Encounter for fitting and adjustment of other gastrointestinal appliance and device: Secondary | ICD-10-CM | POA: Diagnosis not present

## 2014-04-02 DIAGNOSIS — Z48815 Encounter for surgical aftercare following surgery on the digestive system: Secondary | ICD-10-CM | POA: Diagnosis not present

## 2014-04-02 DIAGNOSIS — I2789 Other specified pulmonary heart diseases: Secondary | ICD-10-CM | POA: Diagnosis not present

## 2014-04-02 DIAGNOSIS — R569 Unspecified convulsions: Secondary | ICD-10-CM | POA: Diagnosis not present

## 2014-04-02 DIAGNOSIS — E46 Unspecified protein-calorie malnutrition: Secondary | ICD-10-CM | POA: Diagnosis not present

## 2014-04-02 DIAGNOSIS — Z944 Liver transplant status: Secondary | ICD-10-CM | POA: Diagnosis not present

## 2014-04-05 DIAGNOSIS — Z944 Liver transplant status: Secondary | ICD-10-CM | POA: Diagnosis not present

## 2014-04-05 DIAGNOSIS — Z5181 Encounter for therapeutic drug level monitoring: Secondary | ICD-10-CM | POA: Diagnosis not present

## 2014-04-05 DIAGNOSIS — E46 Unspecified protein-calorie malnutrition: Secondary | ICD-10-CM | POA: Diagnosis not present

## 2014-04-05 DIAGNOSIS — I2789 Other specified pulmonary heart diseases: Secondary | ICD-10-CM | POA: Diagnosis not present

## 2014-04-05 DIAGNOSIS — Z48815 Encounter for surgical aftercare following surgery on the digestive system: Secondary | ICD-10-CM | POA: Diagnosis not present

## 2014-04-05 DIAGNOSIS — R569 Unspecified convulsions: Secondary | ICD-10-CM | POA: Diagnosis not present

## 2014-04-06 DIAGNOSIS — M4310 Spondylolisthesis, site unspecified: Secondary | ICD-10-CM | POA: Diagnosis not present

## 2014-04-06 DIAGNOSIS — M545 Low back pain, unspecified: Secondary | ICD-10-CM | POA: Diagnosis not present

## 2014-04-06 DIAGNOSIS — M47817 Spondylosis without myelopathy or radiculopathy, lumbosacral region: Secondary | ICD-10-CM | POA: Diagnosis not present

## 2014-04-06 DIAGNOSIS — IMO0002 Reserved for concepts with insufficient information to code with codable children: Secondary | ICD-10-CM | POA: Diagnosis not present

## 2014-04-11 DIAGNOSIS — R569 Unspecified convulsions: Secondary | ICD-10-CM

## 2014-04-11 DIAGNOSIS — I2789 Other specified pulmonary heart diseases: Secondary | ICD-10-CM

## 2014-04-11 DIAGNOSIS — Z48814 Encounter for surgical aftercare following surgery on the teeth or oral cavity: Secondary | ICD-10-CM

## 2014-04-11 DIAGNOSIS — Z48815 Encounter for surgical aftercare following surgery on the digestive system: Secondary | ICD-10-CM

## 2014-04-11 DIAGNOSIS — Z944 Liver transplant status: Secondary | ICD-10-CM

## 2014-04-11 DIAGNOSIS — E46 Unspecified protein-calorie malnutrition: Secondary | ICD-10-CM

## 2014-04-12 DIAGNOSIS — E46 Unspecified protein-calorie malnutrition: Secondary | ICD-10-CM | POA: Diagnosis not present

## 2014-04-12 DIAGNOSIS — Z944 Liver transplant status: Secondary | ICD-10-CM | POA: Diagnosis not present

## 2014-04-12 DIAGNOSIS — R569 Unspecified convulsions: Secondary | ICD-10-CM | POA: Diagnosis not present

## 2014-04-12 DIAGNOSIS — I2789 Other specified pulmonary heart diseases: Secondary | ICD-10-CM | POA: Diagnosis not present

## 2014-04-12 DIAGNOSIS — Z48815 Encounter for surgical aftercare following surgery on the digestive system: Secondary | ICD-10-CM | POA: Diagnosis not present

## 2014-04-12 DIAGNOSIS — Z5181 Encounter for therapeutic drug level monitoring: Secondary | ICD-10-CM | POA: Diagnosis not present

## 2014-04-13 DIAGNOSIS — Z944 Liver transplant status: Secondary | ICD-10-CM | POA: Diagnosis not present

## 2014-04-13 DIAGNOSIS — I2789 Other specified pulmonary heart diseases: Secondary | ICD-10-CM | POA: Diagnosis not present

## 2014-04-13 DIAGNOSIS — E46 Unspecified protein-calorie malnutrition: Secondary | ICD-10-CM | POA: Diagnosis not present

## 2014-04-13 DIAGNOSIS — R569 Unspecified convulsions: Secondary | ICD-10-CM | POA: Diagnosis not present

## 2014-04-13 DIAGNOSIS — Z48815 Encounter for surgical aftercare following surgery on the digestive system: Secondary | ICD-10-CM | POA: Diagnosis not present

## 2014-04-15 DIAGNOSIS — Z944 Liver transplant status: Secondary | ICD-10-CM | POA: Diagnosis not present

## 2014-04-15 DIAGNOSIS — I2789 Other specified pulmonary heart diseases: Secondary | ICD-10-CM | POA: Diagnosis not present

## 2014-04-15 DIAGNOSIS — E46 Unspecified protein-calorie malnutrition: Secondary | ICD-10-CM | POA: Diagnosis not present

## 2014-04-15 DIAGNOSIS — Z5181 Encounter for therapeutic drug level monitoring: Secondary | ICD-10-CM | POA: Diagnosis not present

## 2014-04-15 DIAGNOSIS — Z48815 Encounter for surgical aftercare following surgery on the digestive system: Secondary | ICD-10-CM | POA: Diagnosis not present

## 2014-04-15 DIAGNOSIS — R569 Unspecified convulsions: Secondary | ICD-10-CM | POA: Diagnosis not present

## 2014-04-19 DIAGNOSIS — R569 Unspecified convulsions: Secondary | ICD-10-CM | POA: Diagnosis not present

## 2014-04-19 DIAGNOSIS — E46 Unspecified protein-calorie malnutrition: Secondary | ICD-10-CM | POA: Diagnosis not present

## 2014-04-19 DIAGNOSIS — Z48815 Encounter for surgical aftercare following surgery on the digestive system: Secondary | ICD-10-CM | POA: Diagnosis not present

## 2014-04-19 DIAGNOSIS — Z944 Liver transplant status: Secondary | ICD-10-CM | POA: Diagnosis not present

## 2014-04-19 DIAGNOSIS — Z5181 Encounter for therapeutic drug level monitoring: Secondary | ICD-10-CM | POA: Diagnosis not present

## 2014-04-19 DIAGNOSIS — I2789 Other specified pulmonary heart diseases: Secondary | ICD-10-CM | POA: Diagnosis not present

## 2014-04-20 DIAGNOSIS — Z944 Liver transplant status: Secondary | ICD-10-CM | POA: Diagnosis not present

## 2014-04-20 DIAGNOSIS — Z48815 Encounter for surgical aftercare following surgery on the digestive system: Secondary | ICD-10-CM | POA: Diagnosis not present

## 2014-04-20 DIAGNOSIS — E46 Unspecified protein-calorie malnutrition: Secondary | ICD-10-CM | POA: Diagnosis not present

## 2014-04-20 DIAGNOSIS — I2789 Other specified pulmonary heart diseases: Secondary | ICD-10-CM | POA: Diagnosis not present

## 2014-04-20 DIAGNOSIS — R569 Unspecified convulsions: Secondary | ICD-10-CM | POA: Diagnosis not present

## 2014-04-22 DIAGNOSIS — I2789 Other specified pulmonary heart diseases: Secondary | ICD-10-CM | POA: Diagnosis not present

## 2014-04-22 DIAGNOSIS — Z944 Liver transplant status: Secondary | ICD-10-CM | POA: Diagnosis not present

## 2014-04-22 DIAGNOSIS — Z48815 Encounter for surgical aftercare following surgery on the digestive system: Secondary | ICD-10-CM | POA: Diagnosis not present

## 2014-04-22 DIAGNOSIS — E46 Unspecified protein-calorie malnutrition: Secondary | ICD-10-CM | POA: Diagnosis not present

## 2014-04-22 DIAGNOSIS — R569 Unspecified convulsions: Secondary | ICD-10-CM | POA: Diagnosis not present

## 2014-04-23 DIAGNOSIS — M5126 Other intervertebral disc displacement, lumbar region: Secondary | ICD-10-CM | POA: Diagnosis not present

## 2014-04-23 DIAGNOSIS — Z298 Encounter for other specified prophylactic measures: Secondary | ICD-10-CM | POA: Diagnosis not present

## 2014-04-23 DIAGNOSIS — M48061 Spinal stenosis, lumbar region without neurogenic claudication: Secondary | ICD-10-CM | POA: Diagnosis not present

## 2014-04-23 DIAGNOSIS — M539 Dorsopathy, unspecified: Secondary | ICD-10-CM | POA: Diagnosis not present

## 2014-04-23 DIAGNOSIS — Z944 Liver transplant status: Secondary | ICD-10-CM | POA: Diagnosis not present

## 2014-04-23 DIAGNOSIS — Z79899 Other long term (current) drug therapy: Secondary | ICD-10-CM | POA: Diagnosis not present

## 2014-04-26 DIAGNOSIS — I2789 Other specified pulmonary heart diseases: Secondary | ICD-10-CM | POA: Diagnosis not present

## 2014-04-26 DIAGNOSIS — E46 Unspecified protein-calorie malnutrition: Secondary | ICD-10-CM | POA: Diagnosis not present

## 2014-04-26 DIAGNOSIS — R569 Unspecified convulsions: Secondary | ICD-10-CM | POA: Diagnosis not present

## 2014-04-26 DIAGNOSIS — Z79899 Other long term (current) drug therapy: Secondary | ICD-10-CM | POA: Diagnosis not present

## 2014-04-26 DIAGNOSIS — Z48815 Encounter for surgical aftercare following surgery on the digestive system: Secondary | ICD-10-CM | POA: Diagnosis not present

## 2014-04-26 DIAGNOSIS — Z5181 Encounter for therapeutic drug level monitoring: Secondary | ICD-10-CM | POA: Diagnosis not present

## 2014-04-26 DIAGNOSIS — Z944 Liver transplant status: Secondary | ICD-10-CM | POA: Diagnosis not present

## 2014-04-27 DIAGNOSIS — Z48815 Encounter for surgical aftercare following surgery on the digestive system: Secondary | ICD-10-CM | POA: Diagnosis not present

## 2014-04-27 DIAGNOSIS — I2789 Other specified pulmonary heart diseases: Secondary | ICD-10-CM | POA: Diagnosis not present

## 2014-04-27 DIAGNOSIS — Z944 Liver transplant status: Secondary | ICD-10-CM | POA: Diagnosis not present

## 2014-04-27 DIAGNOSIS — R569 Unspecified convulsions: Secondary | ICD-10-CM | POA: Diagnosis not present

## 2014-04-27 DIAGNOSIS — E46 Unspecified protein-calorie malnutrition: Secondary | ICD-10-CM | POA: Diagnosis not present

## 2014-04-29 DIAGNOSIS — I2789 Other specified pulmonary heart diseases: Secondary | ICD-10-CM | POA: Diagnosis not present

## 2014-04-29 DIAGNOSIS — R569 Unspecified convulsions: Secondary | ICD-10-CM | POA: Diagnosis not present

## 2014-04-29 DIAGNOSIS — E46 Unspecified protein-calorie malnutrition: Secondary | ICD-10-CM | POA: Diagnosis not present

## 2014-04-29 DIAGNOSIS — Z944 Liver transplant status: Secondary | ICD-10-CM | POA: Diagnosis not present

## 2014-04-29 DIAGNOSIS — Z48815 Encounter for surgical aftercare following surgery on the digestive system: Secondary | ICD-10-CM | POA: Diagnosis not present

## 2014-05-03 DIAGNOSIS — Z5181 Encounter for therapeutic drug level monitoring: Secondary | ICD-10-CM | POA: Diagnosis not present

## 2014-05-03 DIAGNOSIS — R569 Unspecified convulsions: Secondary | ICD-10-CM | POA: Diagnosis not present

## 2014-05-03 DIAGNOSIS — E46 Unspecified protein-calorie malnutrition: Secondary | ICD-10-CM | POA: Diagnosis not present

## 2014-05-03 DIAGNOSIS — Z79899 Other long term (current) drug therapy: Secondary | ICD-10-CM | POA: Diagnosis not present

## 2014-05-03 DIAGNOSIS — Z48815 Encounter for surgical aftercare following surgery on the digestive system: Secondary | ICD-10-CM | POA: Diagnosis not present

## 2014-05-03 DIAGNOSIS — Z944 Liver transplant status: Secondary | ICD-10-CM | POA: Diagnosis not present

## 2014-05-03 DIAGNOSIS — I2789 Other specified pulmonary heart diseases: Secondary | ICD-10-CM | POA: Diagnosis not present

## 2014-05-06 ENCOUNTER — Other Ambulatory Visit: Payer: Self-pay | Admitting: *Deleted

## 2014-05-10 DIAGNOSIS — Z48815 Encounter for surgical aftercare following surgery on the digestive system: Secondary | ICD-10-CM | POA: Diagnosis not present

## 2014-05-10 DIAGNOSIS — Z79899 Other long term (current) drug therapy: Secondary | ICD-10-CM | POA: Diagnosis not present

## 2014-05-10 DIAGNOSIS — Z944 Liver transplant status: Secondary | ICD-10-CM | POA: Diagnosis not present

## 2014-05-10 DIAGNOSIS — E46 Unspecified protein-calorie malnutrition: Secondary | ICD-10-CM | POA: Diagnosis not present

## 2014-05-10 DIAGNOSIS — I2789 Other specified pulmonary heart diseases: Secondary | ICD-10-CM | POA: Diagnosis not present

## 2014-05-10 DIAGNOSIS — Z5181 Encounter for therapeutic drug level monitoring: Secondary | ICD-10-CM | POA: Diagnosis not present

## 2014-05-10 DIAGNOSIS — R569 Unspecified convulsions: Secondary | ICD-10-CM | POA: Diagnosis not present

## 2014-05-14 DIAGNOSIS — D509 Iron deficiency anemia, unspecified: Secondary | ICD-10-CM | POA: Diagnosis not present

## 2014-05-14 DIAGNOSIS — Z4659 Encounter for fitting and adjustment of other gastrointestinal appliance and device: Secondary | ICD-10-CM | POA: Diagnosis not present

## 2014-05-14 DIAGNOSIS — Z466 Encounter for fitting and adjustment of urinary device: Secondary | ICD-10-CM | POA: Diagnosis not present

## 2014-05-14 DIAGNOSIS — Z4682 Encounter for fitting and adjustment of non-vascular catheter: Secondary | ICD-10-CM | POA: Diagnosis not present

## 2014-05-14 DIAGNOSIS — K831 Obstruction of bile duct: Secondary | ICD-10-CM | POA: Diagnosis not present

## 2014-05-14 DIAGNOSIS — Z8719 Personal history of other diseases of the digestive system: Secondary | ICD-10-CM | POA: Diagnosis not present

## 2014-05-14 DIAGNOSIS — K838 Other specified diseases of biliary tract: Secondary | ICD-10-CM | POA: Diagnosis not present

## 2014-05-14 DIAGNOSIS — Z944 Liver transplant status: Secondary | ICD-10-CM | POA: Diagnosis not present

## 2014-05-24 DIAGNOSIS — Z5181 Encounter for therapeutic drug level monitoring: Secondary | ICD-10-CM | POA: Diagnosis not present

## 2014-05-24 DIAGNOSIS — Z944 Liver transplant status: Secondary | ICD-10-CM | POA: Diagnosis not present

## 2014-05-24 DIAGNOSIS — R569 Unspecified convulsions: Secondary | ICD-10-CM | POA: Diagnosis not present

## 2014-05-24 DIAGNOSIS — Z2989 Encounter for other specified prophylactic measures: Secondary | ICD-10-CM | POA: Diagnosis not present

## 2014-05-24 DIAGNOSIS — E46 Unspecified protein-calorie malnutrition: Secondary | ICD-10-CM | POA: Diagnosis not present

## 2014-05-24 DIAGNOSIS — I2789 Other specified pulmonary heart diseases: Secondary | ICD-10-CM | POA: Diagnosis not present

## 2014-05-24 DIAGNOSIS — R5381 Other malaise: Secondary | ICD-10-CM | POA: Diagnosis not present

## 2014-05-24 DIAGNOSIS — Z298 Encounter for other specified prophylactic measures: Secondary | ICD-10-CM | POA: Diagnosis not present

## 2014-05-24 DIAGNOSIS — Z48815 Encounter for surgical aftercare following surgery on the digestive system: Secondary | ICD-10-CM | POA: Diagnosis not present

## 2014-05-24 DIAGNOSIS — R5383 Other fatigue: Secondary | ICD-10-CM | POA: Diagnosis not present

## 2014-05-28 DIAGNOSIS — M949 Disorder of cartilage, unspecified: Secondary | ICD-10-CM | POA: Diagnosis not present

## 2014-05-28 DIAGNOSIS — Z298 Encounter for other specified prophylactic measures: Secondary | ICD-10-CM | POA: Diagnosis not present

## 2014-05-28 DIAGNOSIS — M48061 Spinal stenosis, lumbar region without neurogenic claudication: Secondary | ICD-10-CM | POA: Diagnosis not present

## 2014-05-28 DIAGNOSIS — M8448XA Pathological fracture, other site, initial encounter for fracture: Secondary | ICD-10-CM | POA: Diagnosis not present

## 2014-05-28 DIAGNOSIS — IMO0002 Reserved for concepts with insufficient information to code with codable children: Secondary | ICD-10-CM | POA: Diagnosis not present

## 2014-05-28 DIAGNOSIS — M899 Disorder of bone, unspecified: Secondary | ICD-10-CM | POA: Diagnosis not present

## 2014-05-28 DIAGNOSIS — S3210XA Unspecified fracture of sacrum, initial encounter for closed fracture: Secondary | ICD-10-CM | POA: Diagnosis not present

## 2014-05-28 DIAGNOSIS — I2789 Other specified pulmonary heart diseases: Secondary | ICD-10-CM | POA: Diagnosis not present

## 2014-05-28 DIAGNOSIS — M539 Dorsopathy, unspecified: Secondary | ICD-10-CM | POA: Diagnosis not present

## 2014-05-28 DIAGNOSIS — S322XXA Fracture of coccyx, initial encounter for closed fracture: Secondary | ICD-10-CM | POA: Diagnosis not present

## 2014-05-28 DIAGNOSIS — M8448XD Pathological fracture, other site, subsequent encounter for fracture with routine healing: Secondary | ICD-10-CM | POA: Diagnosis not present

## 2014-05-28 DIAGNOSIS — M4310 Spondylolisthesis, site unspecified: Secondary | ICD-10-CM | POA: Diagnosis not present

## 2014-05-31 ENCOUNTER — Other Ambulatory Visit: Payer: Medicare Other

## 2014-05-31 DIAGNOSIS — Z944 Liver transplant status: Secondary | ICD-10-CM | POA: Diagnosis not present

## 2014-05-31 DIAGNOSIS — R569 Unspecified convulsions: Secondary | ICD-10-CM | POA: Diagnosis not present

## 2014-05-31 DIAGNOSIS — I2789 Other specified pulmonary heart diseases: Secondary | ICD-10-CM | POA: Diagnosis not present

## 2014-05-31 DIAGNOSIS — Z79899 Other long term (current) drug therapy: Secondary | ICD-10-CM | POA: Diagnosis not present

## 2014-05-31 DIAGNOSIS — E46 Unspecified protein-calorie malnutrition: Secondary | ICD-10-CM | POA: Diagnosis not present

## 2014-05-31 DIAGNOSIS — Z48815 Encounter for surgical aftercare following surgery on the digestive system: Secondary | ICD-10-CM | POA: Diagnosis not present

## 2014-05-31 LAB — COMPREHENSIVE METABOLIC PANEL
ALBUMIN: 3.5 g/dL — AB (ref 3.6–4.8)
ALT: 16 IU/L (ref 0–32)
AST: 25 IU/L (ref 0–40)
Albumin/Globulin Ratio: 1.3 (ref 1.1–2.5)
Alkaline Phosphatase: 150 IU/L — ABNORMAL HIGH (ref 39–117)
BUN/Creatinine Ratio: 22 (ref 11–26)
BUN: 20 mg/dL (ref 8–27)
CALCIUM: 8.7 mg/dL (ref 8.7–10.3)
CHLORIDE: 103 mmol/L (ref 97–108)
CO2: 20 mmol/L (ref 18–29)
Creatinine, Ser: 0.92 mg/dL (ref 0.57–1.00)
GFR calc Af Amer: 77 mL/min/{1.73_m2} (ref >59)
GFR calc non Af Amer: 66 mL/min/{1.73_m2} (ref >59)
Globulin, Total: 2.7 g/dL (ref 1.5–4.5)
Glucose: 93 mg/dL (ref 65–99)
POTASSIUM: 4.7 mmol/L (ref 3.5–5.2)
Sodium: 139 mmol/L (ref 134–144)
TOTAL PROTEIN: 6.2 g/dL (ref 6.0–8.5)

## 2014-05-31 LAB — CBC
HCT: 25.3 % — ABNORMAL LOW (ref 34.0–46.6)
Hemoglobin: 7.4 g/dL — CL (ref 11.1–15.9)
MCH: 19.7 pg — AB (ref 26.6–33.0)
MCHC: 29.2 g/dL — AB (ref 31.5–35.7)
MCV: 67 fL — AB (ref 79–97)
PLATELETS: 487 10*3/uL — AB (ref 150–379)
RBC: 3.76 x10E6/uL — ABNORMAL LOW (ref 3.77–5.28)
RDW: 19.9 % — ABNORMAL HIGH (ref 12.3–15.4)
WBC: 9.4 10*3/uL (ref 3.4–10.8)

## 2014-05-31 LAB — LIPID PANEL
CHOL/HDL RATIO: 3.2 ratio (ref 0.0–4.4)
Cholesterol, Total: 138 mg/dL (ref 100–199)
HDL: 43 mg/dL (ref >39)
LDL CALC: 69 mg/dL (ref 0–99)
Triglycerides: 130 mg/dL (ref 0–149)
VLDL CHOLESTEROL CAL: 26 mg/dL (ref 5–40)

## 2014-05-31 LAB — MAGNESIUM: Magnesium: 1.8 mg/dL (ref 1.6–2.6)

## 2014-05-31 LAB — PROTIME-INR
INR: 1.1 (ref 0.8–1.2)
Prothrombin Time: 10.8 s (ref 9.1–12.0)

## 2014-05-31 LAB — PHOSPHORUS: PHOSPHORUS: 4.2 mg/dL (ref 2.5–4.5)

## 2014-05-31 LAB — URIC ACID: URIC ACID: 8 mg/dL — AB (ref 2.5–7.1)

## 2014-06-01 LAB — TACROLIMUS, BLOOD: Tacrolimus Lvl: 5.2 ng/mL (ref 2.0–20.0)

## 2014-06-07 ENCOUNTER — Other Ambulatory Visit (INDEPENDENT_AMBULATORY_CARE_PROVIDER_SITE_OTHER): Payer: Medicare Other

## 2014-06-07 DIAGNOSIS — Z79899 Other long term (current) drug therapy: Secondary | ICD-10-CM

## 2014-06-07 DIAGNOSIS — Z944 Liver transplant status: Secondary | ICD-10-CM

## 2014-06-07 DIAGNOSIS — R188 Other ascites: Secondary | ICD-10-CM | POA: Diagnosis not present

## 2014-06-07 NOTE — Progress Notes (Signed)
Pt came in for lab  only 

## 2014-06-08 LAB — CMP14+EGFR
A/G RATIO: 1.1 (ref 1.1–2.5)
ALT: 19 IU/L (ref 0–32)
AST: 30 IU/L (ref 0–40)
Albumin: 3.3 g/dL — ABNORMAL LOW (ref 3.6–4.8)
Alkaline Phosphatase: 199 IU/L — ABNORMAL HIGH (ref 39–117)
BUN/Creatinine Ratio: 26 (ref 11–26)
BUN: 21 mg/dL (ref 8–27)
CO2: 21 mmol/L (ref 18–29)
Calcium: 8.6 mg/dL — ABNORMAL LOW (ref 8.7–10.3)
Chloride: 102 mmol/L (ref 97–108)
Creatinine, Ser: 0.81 mg/dL (ref 0.57–1.00)
GFR calc Af Amer: 89 mL/min/{1.73_m2} (ref >59)
GFR, EST NON AFRICAN AMERICAN: 78 mL/min/{1.73_m2} (ref >59)
Globulin, Total: 2.9 g/dL (ref 1.5–4.5)
Glucose: 95 mg/dL (ref 65–99)
POTASSIUM: 4.4 mmol/L (ref 3.5–5.2)
SODIUM: 140 mmol/L (ref 134–144)
Total Bilirubin: <0.2 mg/dL (ref 0.0–1.2)
Total Protein: 6.2 g/dL (ref 6.0–8.5)

## 2014-06-08 LAB — PHOSPHORUS: Phosphorus: 4.5 mg/dL (ref 2.5–4.5)

## 2014-06-08 LAB — CBC WITH DIFFERENTIAL
Basophils Absolute: 0.1 10*3/uL (ref 0.0–0.2)
Basos: 1 %
EOS ABS: 0.5 10*3/uL — AB (ref 0.0–0.4)
Eos: 6 %
HCT: 28 % — ABNORMAL LOW (ref 34.0–46.6)
Hemoglobin: 7.9 g/dL — CL (ref 11.1–15.9)
IMMATURE GRANS (ABS): 0 10*3/uL (ref 0.0–0.1)
Immature Granulocytes: 0 %
Lymphocytes Absolute: 3.2 10*3/uL — ABNORMAL HIGH (ref 0.7–3.1)
Lymphs: 44 %
MCH: 19.3 pg — AB (ref 26.6–33.0)
MCHC: 28.2 g/dL — AB (ref 31.5–35.7)
MCV: 68 fL — ABNORMAL LOW (ref 79–97)
MONOS ABS: 0.9 10*3/uL (ref 0.1–0.9)
Monocytes: 12 %
NEUTROS PCT: 37 %
Neutrophils Absolute: 2.7 10*3/uL (ref 1.4–7.0)
PLATELETS: 513 10*3/uL — AB (ref 150–379)
RBC: 4.1 x10E6/uL (ref 3.77–5.28)
RDW: 20 % — AB (ref 12.3–15.4)
WBC: 7.4 10*3/uL (ref 3.4–10.8)

## 2014-06-08 LAB — LIPID PANEL
CHOL/HDL RATIO: 2.8 ratio (ref 0.0–4.4)
Cholesterol, Total: 145 mg/dL (ref 100–199)
HDL: 51 mg/dL (ref >39)
LDL Calculated: 70 mg/dL (ref 0–99)
Triglycerides: 120 mg/dL (ref 0–149)
VLDL Cholesterol Cal: 24 mg/dL (ref 5–40)

## 2014-06-08 LAB — URIC ACID: Uric Acid: 8.6 mg/dL — ABNORMAL HIGH (ref 2.5–7.1)

## 2014-06-08 LAB — MAGNESIUM: Magnesium: 2 mg/dL (ref 1.6–2.6)

## 2014-06-08 LAB — PROTIME-INR
INR: 1.1 (ref 0.8–1.2)
PROTHROMBIN TIME: 10.7 s (ref 9.1–12.0)

## 2014-06-08 LAB — TACROLIMUS, BLOOD: TACROLIMUS LVL: 3 ng/mL (ref 2.0–20.0)

## 2014-06-14 ENCOUNTER — Other Ambulatory Visit (INDEPENDENT_AMBULATORY_CARE_PROVIDER_SITE_OTHER): Payer: Medicare Other

## 2014-06-14 DIAGNOSIS — R52 Pain, unspecified: Secondary | ICD-10-CM

## 2014-06-14 DIAGNOSIS — Z944 Liver transplant status: Secondary | ICD-10-CM

## 2014-06-14 DIAGNOSIS — K743 Primary biliary cirrhosis: Secondary | ICD-10-CM

## 2014-06-14 DIAGNOSIS — Z09 Encounter for follow-up examination after completed treatment for conditions other than malignant neoplasm: Secondary | ICD-10-CM | POA: Diagnosis not present

## 2014-06-14 DIAGNOSIS — S20219A Contusion of unspecified front wall of thorax, initial encounter: Secondary | ICD-10-CM | POA: Diagnosis not present

## 2014-06-14 DIAGNOSIS — Z79899 Other long term (current) drug therapy: Secondary | ICD-10-CM | POA: Diagnosis not present

## 2014-06-14 DIAGNOSIS — K745 Biliary cirrhosis, unspecified: Secondary | ICD-10-CM | POA: Diagnosis not present

## 2014-06-14 DIAGNOSIS — S20212A Contusion of left front wall of thorax, initial encounter: Secondary | ICD-10-CM

## 2014-06-15 LAB — CBC WITH DIFFERENTIAL
Basophils Absolute: 0.1 10*3/uL (ref 0.0–0.2)
Basos: 1 %
EOS: 2 %
Eosinophils Absolute: 0.2 10*3/uL (ref 0.0–0.4)
HCT: 29.8 % — ABNORMAL LOW (ref 34.0–46.6)
Hemoglobin: 8.2 g/dL — CL (ref 11.1–15.9)
IMMATURE GRANS (ABS): 0 10*3/uL (ref 0.0–0.1)
Immature Granulocytes: 0 %
Lymphocytes Absolute: 3.5 10*3/uL — ABNORMAL HIGH (ref 0.7–3.1)
Lymphs: 39 %
MCH: 18.4 pg — ABNORMAL LOW (ref 26.6–33.0)
MCHC: 27.5 g/dL — ABNORMAL LOW (ref 31.5–35.7)
MCV: 67 fL — ABNORMAL LOW (ref 79–97)
MONOCYTES: 13 %
MONOS ABS: 1.2 10*3/uL — AB (ref 0.1–0.9)
NEUTROS PCT: 45 %
Neutrophils Absolute: 4 10*3/uL (ref 1.4–7.0)
PLATELETS: 606 10*3/uL — AB (ref 150–379)
RBC: 4.46 x10E6/uL (ref 3.77–5.28)
RDW: 20.7 % — ABNORMAL HIGH (ref 12.3–15.4)
WBC: 9 10*3/uL (ref 3.4–10.8)

## 2014-06-15 LAB — IRON AND TIBC
Iron Saturation: 2 % — CL (ref 15–55)
Iron: 9 ug/dL — CL (ref 35–155)
TIBC: 388 ug/dL (ref 250–450)
UIBC: 379 ug/dL — AB (ref 150–375)

## 2014-06-15 LAB — PHOSPHORUS: PHOSPHORUS: 4.7 mg/dL — AB (ref 2.5–4.5)

## 2014-06-15 LAB — COMPREHENSIVE METABOLIC PANEL
ALT: 14 IU/L (ref 0–32)
AST: 25 IU/L (ref 0–40)
Albumin/Globulin Ratio: 1.4 (ref 1.1–2.5)
Albumin: 4.1 g/dL (ref 3.6–4.8)
Alkaline Phosphatase: 189 IU/L — ABNORMAL HIGH (ref 39–117)
BUN/Creatinine Ratio: 17 (ref 11–26)
BUN: 16 mg/dL (ref 8–27)
CALCIUM: 9 mg/dL (ref 8.7–10.3)
CHLORIDE: 101 mmol/L (ref 97–108)
CO2: 21 mmol/L (ref 18–29)
Creatinine, Ser: 0.96 mg/dL (ref 0.57–1.00)
GFR calc Af Amer: 73 mL/min/{1.73_m2} (ref >59)
GFR calc non Af Amer: 63 mL/min/{1.73_m2} (ref >59)
GLUCOSE: 113 mg/dL — AB (ref 65–99)
Globulin, Total: 3 g/dL (ref 1.5–4.5)
Potassium: 4.6 mmol/L (ref 3.5–5.2)
Sodium: 139 mmol/L (ref 134–144)
TOTAL PROTEIN: 7.1 g/dL (ref 6.0–8.5)
Total Bilirubin: 0.2 mg/dL (ref 0.0–1.2)

## 2014-06-15 LAB — HAPTOGLOBIN: Haptoglobin: 194 mg/dL (ref 34–200)

## 2014-06-15 LAB — MAGNESIUM: MAGNESIUM: 1.9 mg/dL (ref 1.6–2.6)

## 2014-06-15 LAB — B12 AND FOLATE PANEL
Folate: >19.9 ng/mL (ref >3.0)
VITAMIN B 12: 295 pg/mL (ref 211–946)

## 2014-06-15 LAB — TRANSFERRIN: Transferrin: 323 mg/dL (ref 200–370)

## 2014-06-15 LAB — ERYTHROPOIETIN: ERYTHROPOIETIN: 113 m[IU]/mL — AB (ref 2.6–18.5)

## 2014-06-15 LAB — EVEROLIMUS: Everolimus: 4.1 ng/mL (ref 3.0–8.0)

## 2014-06-15 LAB — FERRITIN: Ferritin: 10 ng/mL — ABNORMAL LOW (ref 15–150)

## 2014-06-15 LAB — TACROLIMUS, BLOOD: Tacrolimus Lvl: 4 ng/mL (ref 2.0–20.0)

## 2014-06-21 ENCOUNTER — Other Ambulatory Visit (INDEPENDENT_AMBULATORY_CARE_PROVIDER_SITE_OTHER): Payer: Medicare Other

## 2014-06-21 DIAGNOSIS — Z944 Liver transplant status: Secondary | ICD-10-CM | POA: Diagnosis not present

## 2014-06-21 DIAGNOSIS — R5383 Other fatigue: Secondary | ICD-10-CM

## 2014-06-21 DIAGNOSIS — R5381 Other malaise: Secondary | ICD-10-CM

## 2014-06-21 DIAGNOSIS — K745 Biliary cirrhosis, unspecified: Secondary | ICD-10-CM

## 2014-06-21 DIAGNOSIS — Z09 Encounter for follow-up examination after completed treatment for conditions other than malignant neoplasm: Secondary | ICD-10-CM

## 2014-06-21 DIAGNOSIS — Z79899 Other long term (current) drug therapy: Secondary | ICD-10-CM

## 2014-06-21 NOTE — Progress Notes (Signed)
Patient came in for lab ordered by [facility]

## 2014-06-22 LAB — CMP14+EGFR
ALT: 15 IU/L (ref 0–32)
AST: 29 IU/L (ref 0–40)
Albumin/Globulin Ratio: 1.3 (ref 1.1–2.5)
Albumin: 3.4 g/dL — ABNORMAL LOW (ref 3.6–4.8)
Alkaline Phosphatase: 181 IU/L — ABNORMAL HIGH (ref 39–117)
BUN/Creatinine Ratio: 19 (ref 11–26)
BUN: 20 mg/dL (ref 8–27)
CALCIUM: 8.9 mg/dL (ref 8.7–10.3)
CHLORIDE: 102 mmol/L (ref 97–108)
CO2: 23 mmol/L (ref 18–29)
Creatinine, Ser: 1.05 mg/dL — ABNORMAL HIGH (ref 0.57–1.00)
GFR calc Af Amer: 65 mL/min/{1.73_m2} (ref >59)
GFR, EST NON AFRICAN AMERICAN: 57 mL/min/{1.73_m2} — AB (ref >59)
GLUCOSE: 86 mg/dL (ref 65–99)
Globulin, Total: 2.7 g/dL (ref 1.5–4.5)
POTASSIUM: 4.8 mmol/L (ref 3.5–5.2)
Sodium: 139 mmol/L (ref 134–144)
Total Bilirubin: <0.2 mg/dL (ref 0.0–1.2)
Total Protein: 6.1 g/dL (ref 6.0–8.5)

## 2014-06-22 LAB — CBC WITH DIFFERENTIAL
BASOS: 2 %
Basophils Absolute: 0.1 10*3/uL (ref 0.0–0.2)
EOS: 6 %
Eosinophils Absolute: 0.4 10*3/uL (ref 0.0–0.4)
HEMATOCRIT: 26.3 % — AB (ref 34.0–46.6)
HEMOGLOBIN: 7.5 g/dL — AB (ref 11.1–15.9)
Immature Grans (Abs): 0 10*3/uL (ref 0.0–0.1)
Immature Granulocytes: 0 %
LYMPHS: 51 %
Lymphocytes Absolute: 3.7 10*3/uL — ABNORMAL HIGH (ref 0.7–3.1)
MCH: 18.9 pg — ABNORMAL LOW (ref 26.6–33.0)
MCHC: 28.5 g/dL — ABNORMAL LOW (ref 31.5–35.7)
MCV: 66 fL — AB (ref 79–97)
Monocytes Absolute: 1.1 10*3/uL — ABNORMAL HIGH (ref 0.1–0.9)
Monocytes: 16 %
Neutrophils Absolute: 1.8 10*3/uL (ref 1.4–7.0)
Neutrophils Relative %: 25 %
Platelets: 565 10*3/uL — ABNORMAL HIGH (ref 150–379)
RBC: 3.97 x10E6/uL (ref 3.77–5.28)
RDW: 20.4 % — ABNORMAL HIGH (ref 12.3–15.4)
WBC: 7.2 10*3/uL (ref 3.4–10.8)

## 2014-06-22 LAB — EVEROLIMUS: EVEROLIMUS: 3.7 ng/mL (ref 3.0–8.0)

## 2014-06-22 LAB — URIC ACID: URIC ACID: 11.3 mg/dL — AB (ref 2.5–7.1)

## 2014-06-22 LAB — PHOSPHORUS: PHOSPHORUS: 4.2 mg/dL (ref 2.5–4.5)

## 2014-06-22 LAB — TACROLIMUS, BLOOD: Tacrolimus Lvl: 4.6 ng/mL (ref 2.0–20.0)

## 2014-06-22 LAB — MAGNESIUM: MAGNESIUM: 2 mg/dL (ref 1.6–2.6)

## 2014-07-02 DIAGNOSIS — Z01419 Encounter for gynecological examination (general) (routine) without abnormal findings: Secondary | ICD-10-CM | POA: Diagnosis not present

## 2014-07-02 DIAGNOSIS — E039 Hypothyroidism, unspecified: Secondary | ICD-10-CM | POA: Diagnosis not present

## 2014-07-05 ENCOUNTER — Other Ambulatory Visit (INDEPENDENT_AMBULATORY_CARE_PROVIDER_SITE_OTHER): Payer: Medicare Other

## 2014-07-05 DIAGNOSIS — Z944 Liver transplant status: Secondary | ICD-10-CM

## 2014-07-05 DIAGNOSIS — R5381 Other malaise: Secondary | ICD-10-CM

## 2014-07-05 DIAGNOSIS — Z79899 Other long term (current) drug therapy: Secondary | ICD-10-CM

## 2014-07-05 DIAGNOSIS — K745 Biliary cirrhosis, unspecified: Secondary | ICD-10-CM

## 2014-07-05 DIAGNOSIS — Z09 Encounter for follow-up examination after completed treatment for conditions other than malignant neoplasm: Secondary | ICD-10-CM

## 2014-07-05 DIAGNOSIS — R5383 Other fatigue: Secondary | ICD-10-CM | POA: Diagnosis not present

## 2014-07-05 NOTE — Progress Notes (Signed)
Pt came in for lab  only 

## 2014-07-07 LAB — CMP14+EGFR
A/G RATIO: 1.2 (ref 1.1–2.5)
ALT: 28 IU/L (ref 0–32)
AST: 41 IU/L — AB (ref 0–40)
Albumin: 3.6 g/dL (ref 3.6–4.8)
Alkaline Phosphatase: 290 IU/L — ABNORMAL HIGH (ref 39–117)
BUN/Creatinine Ratio: 22 (ref 11–26)
BUN: 20 mg/dL (ref 8–27)
CALCIUM: 9.1 mg/dL (ref 8.7–10.3)
CHLORIDE: 100 mmol/L (ref 97–108)
CO2: 25 mmol/L (ref 18–29)
Creatinine, Ser: 0.91 mg/dL (ref 0.57–1.00)
GFR calc Af Amer: 78 mL/min/{1.73_m2} (ref >59)
GFR, EST NON AFRICAN AMERICAN: 67 mL/min/{1.73_m2} (ref >59)
Globulin, Total: 2.9 g/dL (ref 1.5–4.5)
Glucose: 100 mg/dL — ABNORMAL HIGH (ref 65–99)
POTASSIUM: 4.3 mmol/L (ref 3.5–5.2)
SODIUM: 141 mmol/L (ref 134–144)
Total Bilirubin: 0.4 mg/dL (ref 0.0–1.2)
Total Protein: 6.5 g/dL (ref 6.0–8.5)

## 2014-07-07 LAB — CBC WITH DIFFERENTIAL
BASOS ABS: 0.2 10*3/uL (ref 0.0–0.2)
Basos: 3 %
EOS ABS: 0.4 10*3/uL (ref 0.0–0.4)
Eos: 5 %
HCT: 33 % — ABNORMAL LOW (ref 34.0–46.6)
Hemoglobin: 9.2 g/dL — ABNORMAL LOW (ref 11.1–15.9)
Immature Grans (Abs): 0 10*3/uL (ref 0.0–0.1)
Immature Granulocytes: 0 %
LYMPHS: 39 %
Lymphocytes Absolute: 2.8 10*3/uL (ref 0.7–3.1)
MCH: 21.5 pg — ABNORMAL LOW (ref 26.6–33.0)
MCHC: 27.9 g/dL — AB (ref 31.5–35.7)
MCV: 77 fL — ABNORMAL LOW (ref 79–97)
Monocytes Absolute: 1 10*3/uL — ABNORMAL HIGH (ref 0.1–0.9)
Monocytes: 14 %
Neutrophils Absolute: 2.9 10*3/uL (ref 1.4–7.0)
Neutrophils Relative %: 39 %
Platelets: 476 10*3/uL — ABNORMAL HIGH (ref 150–379)
RBC: 4.28 x10E6/uL (ref 3.77–5.28)
RDW: 31.2 % — AB (ref 12.3–15.4)
WBC: 7.3 10*3/uL (ref 3.4–10.8)

## 2014-07-07 LAB — PHOSPHORUS: Phosphorus: 4.7 mg/dL — ABNORMAL HIGH (ref 2.5–4.5)

## 2014-07-07 LAB — LIPID PANEL
CHOL/HDL RATIO: 2.6 ratio (ref 0.0–4.4)
Cholesterol, Total: 156 mg/dL (ref 100–199)
HDL: 60 mg/dL (ref >39)
LDL Calculated: 73 mg/dL (ref 0–99)
Triglycerides: 116 mg/dL (ref 0–149)
VLDL Cholesterol Cal: 23 mg/dL (ref 5–40)

## 2014-07-07 LAB — TACROLIMUS, BLOOD: TACROLIMUS LVL: 3.6 ng/mL (ref 2.0–20.0)

## 2014-07-07 LAB — EVEROLIMUS: EVEROLIMUS: 2.2 ng/mL — AB (ref 3.0–8.0)

## 2014-07-07 LAB — MAGNESIUM: Magnesium: 1.8 mg/dL (ref 1.6–2.6)

## 2014-07-07 LAB — URIC ACID: URIC ACID: 10.7 mg/dL — AB (ref 2.5–7.1)

## 2014-07-12 ENCOUNTER — Encounter: Payer: Self-pay | Admitting: Family Medicine

## 2014-07-12 ENCOUNTER — Other Ambulatory Visit (INDEPENDENT_AMBULATORY_CARE_PROVIDER_SITE_OTHER): Payer: Medicare Other

## 2014-07-12 DIAGNOSIS — Z944 Liver transplant status: Secondary | ICD-10-CM | POA: Diagnosis not present

## 2014-07-13 LAB — CMP14+EGFR
ALT: 29 IU/L (ref 0–32)
AST: 42 IU/L — ABNORMAL HIGH (ref 0–40)
Albumin/Globulin Ratio: 1.3 (ref 1.1–2.5)
Albumin: 3.4 g/dL — ABNORMAL LOW (ref 3.6–4.8)
Alkaline Phosphatase: 357 IU/L — ABNORMAL HIGH (ref 39–117)
BUN/Creatinine Ratio: 22 (ref 11–26)
BUN: 16 mg/dL (ref 8–27)
CALCIUM: 8.8 mg/dL (ref 8.7–10.3)
CO2: 23 mmol/L (ref 18–29)
CREATININE: 0.73 mg/dL (ref 0.57–1.00)
Chloride: 102 mmol/L (ref 97–108)
GFR, EST AFRICAN AMERICAN: 101 mL/min/{1.73_m2} (ref >59)
GFR, EST NON AFRICAN AMERICAN: 88 mL/min/{1.73_m2} (ref >59)
GLOBULIN, TOTAL: 2.7 g/dL (ref 1.5–4.5)
GLUCOSE: 94 mg/dL (ref 65–99)
POTASSIUM: 4.7 mmol/L (ref 3.5–5.2)
Sodium: 139 mmol/L (ref 134–144)
TOTAL PROTEIN: 6.1 g/dL (ref 6.0–8.5)
Total Bilirubin: 0.3 mg/dL (ref 0.0–1.2)

## 2014-07-13 LAB — CBC WITH DIFFERENTIAL
BASOS: 2 %
Basophils Absolute: 0.2 10*3/uL (ref 0.0–0.2)
EOS: 4 %
Eosinophils Absolute: 0.3 10*3/uL (ref 0.0–0.4)
HEMATOCRIT: 32.8 % — AB (ref 34.0–46.6)
Hemoglobin: 9.7 g/dL — ABNORMAL LOW (ref 11.1–15.9)
Immature Grans (Abs): 0 10*3/uL (ref 0.0–0.1)
Immature Granulocytes: 0 %
LYMPHS ABS: 2.9 10*3/uL (ref 0.7–3.1)
LYMPHS: 43 %
MCH: 23 pg — ABNORMAL LOW (ref 26.6–33.0)
MCHC: 29.6 g/dL — ABNORMAL LOW (ref 31.5–35.7)
MCV: 78 fL — ABNORMAL LOW (ref 79–97)
MONOCYTES: 19 %
Monocytes Absolute: 1.4 10*3/uL — ABNORMAL HIGH (ref 0.1–0.9)
NEUTROS ABS: 2.3 10*3/uL (ref 1.4–7.0)
Neutrophils Relative %: 32 %
Platelets: 459 10*3/uL — ABNORMAL HIGH (ref 150–379)
RBC: 4.21 x10E6/uL (ref 3.77–5.28)
WBC: 7 10*3/uL (ref 3.4–10.8)

## 2014-07-13 LAB — EVEROLIMUS: Everolimus: 3 ng/mL (ref 3.0–8.0)

## 2014-07-13 LAB — TACROLIMUS, BLOOD: Tacrolimus Lvl: 1.8 ng/mL — ABNORMAL LOW (ref 2.0–20.0)

## 2014-07-20 ENCOUNTER — Other Ambulatory Visit (INDEPENDENT_AMBULATORY_CARE_PROVIDER_SITE_OTHER): Payer: Medicare Other

## 2014-07-20 DIAGNOSIS — K745 Biliary cirrhosis, unspecified: Secondary | ICD-10-CM

## 2014-07-20 DIAGNOSIS — Z944 Liver transplant status: Secondary | ICD-10-CM

## 2014-07-20 DIAGNOSIS — Z09 Encounter for follow-up examination after completed treatment for conditions other than malignant neoplasm: Secondary | ICD-10-CM | POA: Diagnosis not present

## 2014-07-20 DIAGNOSIS — Z79899 Other long term (current) drug therapy: Secondary | ICD-10-CM | POA: Diagnosis not present

## 2014-07-20 NOTE — Progress Notes (Signed)
Lab only 

## 2014-07-22 LAB — CMP14+EGFR
ALT: 35 IU/L — ABNORMAL HIGH (ref 0–32)
AST: 39 IU/L (ref 0–40)
Albumin/Globulin Ratio: 1.2 (ref 1.1–2.5)
Albumin: 3.4 g/dL — ABNORMAL LOW (ref 3.6–4.8)
Alkaline Phosphatase: 419 IU/L — ABNORMAL HIGH (ref 39–117)
BUN/Creatinine Ratio: 23 (ref 11–26)
BUN: 23 mg/dL (ref 8–27)
CHLORIDE: 102 mmol/L (ref 97–108)
CO2: 26 mmol/L (ref 18–29)
CREATININE: 1.02 mg/dL — AB (ref 0.57–1.00)
Calcium: 9 mg/dL (ref 8.7–10.3)
GFR calc Af Amer: 68 mL/min/{1.73_m2} (ref >59)
GFR calc non Af Amer: 59 mL/min/{1.73_m2} — ABNORMAL LOW (ref >59)
GLOBULIN, TOTAL: 2.8 g/dL (ref 1.5–4.5)
GLUCOSE: 90 mg/dL (ref 65–99)
Potassium: 4.5 mmol/L (ref 3.5–5.2)
Sodium: 142 mmol/L (ref 134–144)
TOTAL PROTEIN: 6.2 g/dL (ref 6.0–8.5)
Total Bilirubin: 0.3 mg/dL (ref 0.0–1.2)

## 2014-07-22 LAB — CBC WITH DIFFERENTIAL
BASOS: 2 %
Basophils Absolute: 0.2 10*3/uL (ref 0.0–0.2)
EOS ABS: 0.4 10*3/uL (ref 0.0–0.4)
EOS: 6 %
HEMATOCRIT: 33.4 % — AB (ref 34.0–46.6)
Hemoglobin: 10.2 g/dL — ABNORMAL LOW (ref 11.1–15.9)
Immature Grans (Abs): 0 10*3/uL (ref 0.0–0.1)
Immature Granulocytes: 0 %
LYMPHS ABS: 2.9 10*3/uL (ref 0.7–3.1)
Lymphs: 44 %
MCH: 24.9 pg — AB (ref 26.6–33.0)
MCHC: 30.5 g/dL — ABNORMAL LOW (ref 31.5–35.7)
MCV: 82 fL (ref 79–97)
MONOS ABS: 1.1 10*3/uL — AB (ref 0.1–0.9)
Monocytes: 16 %
Neutrophils Absolute: 2.1 10*3/uL (ref 1.4–7.0)
Neutrophils Relative %: 32 %
Platelets: 404 10*3/uL — ABNORMAL HIGH (ref 150–379)
RBC: 4.1 x10E6/uL (ref 3.77–5.28)
WBC: 6.7 10*3/uL (ref 3.4–10.8)

## 2014-07-22 LAB — TACROLIMUS, BLOOD: TACROLIMUS LVL: 3.6 ng/mL (ref 2.0–20.0)

## 2014-07-22 LAB — MAGNESIUM: MAGNESIUM: 1.9 mg/dL (ref 1.6–2.6)

## 2014-07-22 LAB — FERRITIN: Ferritin: 34 ng/mL (ref 15–150)

## 2014-07-22 LAB — EVEROLIMUS: EVEROLIMUS: 3.7 ng/mL (ref 3.0–8.0)

## 2014-07-22 LAB — ERYTHROPOIETIN: ERYTHROPOIETIN: 24.6 m[IU]/mL — AB (ref 2.6–18.5)

## 2014-07-22 LAB — FOLATE: Folate: 17.1 ng/mL (ref >3.0)

## 2014-07-22 LAB — PHOSPHORUS: Phosphorus: 5.3 mg/dL — ABNORMAL HIGH (ref 2.5–4.5)

## 2014-07-22 LAB — IRON AND TIBC
IRON: 26 ug/dL — AB (ref 35–155)
Iron Saturation: 9 % — CL (ref 15–55)
TIBC: 293 ug/dL (ref 250–450)
UIBC: 267 ug/dL (ref 150–375)

## 2014-07-22 LAB — HAPTOGLOBIN: Haptoglobin: 133 mg/dL (ref 34–200)

## 2014-07-22 LAB — VITAMIN B12: Vitamin B-12: 311 pg/mL (ref 211–946)

## 2014-07-23 DIAGNOSIS — I509 Heart failure, unspecified: Secondary | ICD-10-CM | POA: Diagnosis not present

## 2014-07-23 DIAGNOSIS — I2789 Other specified pulmonary heart diseases: Secondary | ICD-10-CM | POA: Diagnosis not present

## 2014-07-23 DIAGNOSIS — Z944 Liver transplant status: Secondary | ICD-10-CM | POA: Diagnosis not present

## 2014-07-23 DIAGNOSIS — K432 Incisional hernia without obstruction or gangrene: Secondary | ICD-10-CM | POA: Diagnosis not present

## 2014-07-23 DIAGNOSIS — I10 Essential (primary) hypertension: Secondary | ICD-10-CM | POA: Diagnosis not present

## 2014-08-02 ENCOUNTER — Other Ambulatory Visit (INDEPENDENT_AMBULATORY_CARE_PROVIDER_SITE_OTHER): Payer: Medicare Other

## 2014-08-02 DIAGNOSIS — K745 Biliary cirrhosis, unspecified: Secondary | ICD-10-CM

## 2014-08-02 DIAGNOSIS — Z79899 Other long term (current) drug therapy: Secondary | ICD-10-CM

## 2014-08-02 DIAGNOSIS — Z944 Liver transplant status: Secondary | ICD-10-CM | POA: Diagnosis not present

## 2014-08-02 DIAGNOSIS — Z09 Encounter for follow-up examination after completed treatment for conditions other than malignant neoplasm: Secondary | ICD-10-CM

## 2014-08-02 DIAGNOSIS — R52 Pain, unspecified: Secondary | ICD-10-CM | POA: Diagnosis not present

## 2014-08-02 NOTE — Progress Notes (Signed)
Labs for transplant doctor

## 2014-08-04 LAB — COMPREHENSIVE METABOLIC PANEL
ALT: 22 IU/L (ref 0–32)
AST: 31 IU/L (ref 0–40)
Albumin/Globulin Ratio: 1.2 (ref 1.1–2.5)
Albumin: 3.3 g/dL — ABNORMAL LOW (ref 3.6–4.8)
Alkaline Phosphatase: 313 IU/L — ABNORMAL HIGH (ref 39–117)
BUN / CREAT RATIO: 23 (ref 11–26)
BUN: 15 mg/dL (ref 8–27)
CALCIUM: 8.7 mg/dL (ref 8.7–10.3)
CO2: 23 mmol/L (ref 18–29)
Chloride: 103 mmol/L (ref 97–108)
Creatinine, Ser: 0.64 mg/dL (ref 0.57–1.00)
GFR calc Af Amer: 110 mL/min/{1.73_m2} (ref >59)
GFR, EST NON AFRICAN AMERICAN: 95 mL/min/{1.73_m2} (ref >59)
GLOBULIN, TOTAL: 2.8 g/dL (ref 1.5–4.5)
Glucose: 96 mg/dL (ref 65–99)
Potassium: 4.6 mmol/L (ref 3.5–5.2)
SODIUM: 141 mmol/L (ref 134–144)
Total Bilirubin: <0.2 mg/dL (ref 0.0–1.2)
Total Protein: 6.1 g/dL (ref 6.0–8.5)

## 2014-08-04 LAB — CBC WITH DIFFERENTIAL
BASOS: 3 %
Basophils Absolute: 0.2 10*3/uL (ref 0.0–0.2)
EOS ABS: 0.3 10*3/uL (ref 0.0–0.4)
Eos: 5 %
HCT: 32.3 % — ABNORMAL LOW (ref 34.0–46.6)
Hemoglobin: 9.9 g/dL — ABNORMAL LOW (ref 11.1–15.9)
IMMATURE GRANS (ABS): 0 10*3/uL (ref 0.0–0.1)
IMMATURE GRANULOCYTES: 0 %
LYMPHS: 44 %
Lymphocytes Absolute: 2.6 10*3/uL (ref 0.7–3.1)
MCH: 24.3 pg — ABNORMAL LOW (ref 26.6–33.0)
MCHC: 30.7 g/dL — ABNORMAL LOW (ref 31.5–35.7)
MCV: 79 fL (ref 79–97)
Monocytes Absolute: 0.9 10*3/uL (ref 0.1–0.9)
Monocytes: 15 %
NEUTROS PCT: 33 %
Neutrophils Absolute: 1.9 10*3/uL (ref 1.4–7.0)
PLATELETS: 446 10*3/uL — AB (ref 150–379)
RBC: 4.07 x10E6/uL (ref 3.77–5.28)
WBC: 5.9 10*3/uL (ref 3.4–10.8)

## 2014-08-04 LAB — HAPTOGLOBIN: HAPTOGLOBIN: 176 mg/dL (ref 34–200)

## 2014-08-04 LAB — B12 AND FOLATE PANEL
FOLATE: 16.2 ng/mL (ref >3.0)
Vitamin B-12: 305 pg/mL (ref 211–946)

## 2014-08-04 LAB — FERRITIN: Ferritin: 22 ng/mL (ref 15–150)

## 2014-08-04 LAB — TACROLIMUS, BLOOD: Tacrolimus Lvl: 3.3 ng/mL (ref 2.0–20.0)

## 2014-08-04 LAB — IRON AND TIBC
IRON SATURATION: 5 % — AB (ref 15–55)
Iron: 14 ug/dL — ABNORMAL LOW (ref 35–155)
TIBC: 269 ug/dL (ref 250–450)
UIBC: 255 ug/dL (ref 150–375)

## 2014-08-04 LAB — TRANSFERRIN: Transferrin: 253 mg/dL (ref 200–370)

## 2014-08-04 LAB — ERYTHROPOIETIN: ERYTHROPOIETIN: 22.7 m[IU]/mL — AB (ref 2.6–18.5)

## 2014-08-04 LAB — EVEROLIMUS: Everolimus: 2.8 ng/mL — ABNORMAL LOW (ref 3.0–8.0)

## 2014-08-04 LAB — ALKALINE PHOSPHATASE, ISOENZYMES
BONE FRACTION: 28 % (ref 14–68)
INTESTINAL FRAC.: 0 % (ref 0–18)
LIVER FRACTION: 72 % (ref 18–85)

## 2014-08-04 LAB — MAGNESIUM: Magnesium: 1.8 mg/dL (ref 1.6–2.6)

## 2014-08-04 LAB — PHOSPHORUS: Phosphorus: 4.2 mg/dL (ref 2.5–4.5)

## 2014-08-16 DIAGNOSIS — I10 Essential (primary) hypertension: Secondary | ICD-10-CM | POA: Diagnosis not present

## 2014-08-16 DIAGNOSIS — Z01810 Encounter for preprocedural cardiovascular examination: Secondary | ICD-10-CM | POA: Diagnosis not present

## 2014-08-16 DIAGNOSIS — Z418 Encounter for other procedures for purposes other than remedying health state: Secondary | ICD-10-CM | POA: Diagnosis not present

## 2014-08-16 DIAGNOSIS — K469 Unspecified abdominal hernia without obstruction or gangrene: Secondary | ICD-10-CM | POA: Diagnosis not present

## 2014-08-16 DIAGNOSIS — K432 Incisional hernia without obstruction or gangrene: Secondary | ICD-10-CM | POA: Diagnosis not present

## 2014-08-16 DIAGNOSIS — I509 Heart failure, unspecified: Secondary | ICD-10-CM | POA: Diagnosis not present

## 2014-08-16 DIAGNOSIS — Z23 Encounter for immunization: Secondary | ICD-10-CM | POA: Diagnosis not present

## 2014-08-16 DIAGNOSIS — Z4823 Encounter for aftercare following liver transplant: Secondary | ICD-10-CM | POA: Diagnosis not present

## 2014-08-16 DIAGNOSIS — Z79899 Other long term (current) drug therapy: Secondary | ICD-10-CM | POA: Diagnosis not present

## 2014-08-16 DIAGNOSIS — Z22322 Carrier or suspected carrier of Methicillin resistant Staphylococcus aureus: Secondary | ICD-10-CM | POA: Diagnosis not present

## 2014-08-16 DIAGNOSIS — Z944 Liver transplant status: Secondary | ICD-10-CM | POA: Diagnosis not present

## 2014-08-16 DIAGNOSIS — K439 Ventral hernia without obstruction or gangrene: Secondary | ICD-10-CM | POA: Diagnosis not present

## 2014-08-17 DIAGNOSIS — R0600 Dyspnea, unspecified: Secondary | ICD-10-CM | POA: Diagnosis not present

## 2014-08-30 ENCOUNTER — Other Ambulatory Visit: Payer: Medicare Other

## 2014-08-30 DIAGNOSIS — K743 Primary biliary cirrhosis: Secondary | ICD-10-CM

## 2014-08-30 DIAGNOSIS — R52 Pain, unspecified: Secondary | ICD-10-CM | POA: Diagnosis not present

## 2014-08-30 DIAGNOSIS — Z944 Liver transplant status: Secondary | ICD-10-CM

## 2014-08-30 DIAGNOSIS — Z09 Encounter for follow-up examination after completed treatment for conditions other than malignant neoplasm: Secondary | ICD-10-CM

## 2014-08-30 DIAGNOSIS — R5383 Other fatigue: Secondary | ICD-10-CM | POA: Diagnosis not present

## 2014-08-30 DIAGNOSIS — E079 Disorder of thyroid, unspecified: Secondary | ICD-10-CM | POA: Diagnosis not present

## 2014-08-30 DIAGNOSIS — K769 Liver disease, unspecified: Secondary | ICD-10-CM | POA: Diagnosis not present

## 2014-08-30 DIAGNOSIS — D638 Anemia in other chronic diseases classified elsewhere: Secondary | ICD-10-CM | POA: Diagnosis not present

## 2014-08-30 DIAGNOSIS — K745 Biliary cirrhosis, unspecified: Secondary | ICD-10-CM | POA: Diagnosis not present

## 2014-08-30 NOTE — Progress Notes (Signed)
Labs only

## 2014-08-31 LAB — CMP14+EGFR
A/G RATIO: 1.3 (ref 1.1–2.5)
ALBUMIN: 3.8 g/dL (ref 3.6–4.8)
ALK PHOS: 348 IU/L — AB (ref 39–117)
ALT: 26 IU/L (ref 0–32)
AST: 39 IU/L (ref 0–40)
BUN / CREAT RATIO: 28 — AB (ref 11–26)
BUN: 23 mg/dL (ref 8–27)
CO2: 26 mmol/L (ref 18–29)
CREATININE: 0.82 mg/dL (ref 0.57–1.00)
Calcium: 9.1 mg/dL (ref 8.7–10.3)
Chloride: 101 mmol/L (ref 97–108)
GFR calc non Af Amer: 76 mL/min/{1.73_m2} (ref >59)
GFR, EST AFRICAN AMERICAN: 88 mL/min/{1.73_m2} (ref >59)
GLOBULIN, TOTAL: 3 g/dL (ref 1.5–4.5)
Glucose: 97 mg/dL (ref 65–99)
Potassium: 4.3 mmol/L (ref 3.5–5.2)
SODIUM: 141 mmol/L (ref 134–144)
Total Protein: 6.8 g/dL (ref 6.0–8.5)

## 2014-08-31 LAB — IRON AND TIBC
Iron Saturation: 7 % — CL (ref 15–55)
Iron: 22 ug/dL — ABNORMAL LOW (ref 35–155)
TIBC: 308 ug/dL (ref 250–450)
UIBC: 286 ug/dL (ref 150–375)

## 2014-08-31 LAB — CBC WITH DIFFERENTIAL
Basophils Absolute: 0.1 10*3/uL (ref 0.0–0.2)
Basos: 2 %
EOS: 5 %
Eosinophils Absolute: 0.4 10*3/uL (ref 0.0–0.4)
HCT: 33.3 % — ABNORMAL LOW (ref 34.0–46.6)
Hemoglobin: 10.3 g/dL — ABNORMAL LOW (ref 11.1–15.9)
IMMATURE GRANS (ABS): 0 10*3/uL (ref 0.0–0.1)
IMMATURE GRANULOCYTES: 0 %
Lymphocytes Absolute: 3.2 10*3/uL — ABNORMAL HIGH (ref 0.7–3.1)
Lymphs: 43 %
MCH: 25.4 pg — ABNORMAL LOW (ref 26.6–33.0)
MCHC: 30.9 g/dL — ABNORMAL LOW (ref 31.5–35.7)
MCV: 82 fL (ref 79–97)
MONOCYTES: 13 %
Monocytes Absolute: 1 10*3/uL — ABNORMAL HIGH (ref 0.1–0.9)
Neutrophils Absolute: 2.7 10*3/uL (ref 1.4–7.0)
Neutrophils Relative %: 37 %
PLATELETS: 411 10*3/uL — AB (ref 150–379)
RBC: 4.05 x10E6/uL (ref 3.77–5.28)
RDW: 22.1 % — ABNORMAL HIGH (ref 12.3–15.4)
WBC: 7.3 10*3/uL (ref 3.4–10.8)

## 2014-08-31 LAB — B12 AND FOLATE PANEL
Folate: >19.9 ng/mL (ref >3.0)
Vitamin B-12: 372 pg/mL (ref 211–946)

## 2014-08-31 LAB — TRANSFERRIN: TRANSFERRIN: 268 mg/dL (ref 200–370)

## 2014-08-31 LAB — PHOSPHORUS: PHOSPHORUS: 5 mg/dL — AB (ref 2.5–4.5)

## 2014-08-31 LAB — MAGNESIUM: Magnesium: 1.9 mg/dL (ref 1.6–2.6)

## 2014-08-31 LAB — ERYTHROPOIETIN: ERYTHROPOIETIN: 34.5 m[IU]/mL — AB (ref 2.6–18.5)

## 2014-08-31 LAB — TACROLIMUS, BLOOD: TACROLIMUS LVL: 4.1 ng/mL (ref 2.0–20.0)

## 2014-08-31 LAB — HAPTOGLOBIN: HAPTOGLOBIN: 182 mg/dL (ref 34–200)

## 2014-08-31 LAB — EVEROLIMUS: Everolimus: 5.6 ng/mL (ref 3.0–8.0)

## 2014-08-31 LAB — FERRITIN: FERRITIN: 32 ng/mL (ref 15–150)

## 2014-09-01 DIAGNOSIS — Z8719 Personal history of other diseases of the digestive system: Secondary | ICD-10-CM | POA: Diagnosis not present

## 2014-09-01 DIAGNOSIS — C184 Malignant neoplasm of transverse colon: Secondary | ICD-10-CM | POA: Diagnosis not present

## 2014-09-01 DIAGNOSIS — D649 Anemia, unspecified: Secondary | ICD-10-CM | POA: Diagnosis not present

## 2014-09-01 DIAGNOSIS — Z980 Intestinal bypass and anastomosis status: Secondary | ICD-10-CM | POA: Diagnosis not present

## 2014-09-01 DIAGNOSIS — Z800 Family history of malignant neoplasm of digestive organs: Secondary | ICD-10-CM | POA: Diagnosis not present

## 2014-09-01 DIAGNOSIS — K3189 Other diseases of stomach and duodenum: Secondary | ICD-10-CM | POA: Diagnosis not present

## 2014-09-01 DIAGNOSIS — K31819 Angiodysplasia of stomach and duodenum without bleeding: Secondary | ICD-10-CM | POA: Diagnosis not present

## 2014-09-01 DIAGNOSIS — D509 Iron deficiency anemia, unspecified: Secondary | ICD-10-CM | POA: Diagnosis not present

## 2014-09-01 DIAGNOSIS — Z944 Liver transplant status: Secondary | ICD-10-CM | POA: Diagnosis not present

## 2014-09-01 DIAGNOSIS — K6289 Other specified diseases of anus and rectum: Secondary | ICD-10-CM | POA: Diagnosis not present

## 2014-09-01 DIAGNOSIS — K648 Other hemorrhoids: Secondary | ICD-10-CM | POA: Diagnosis not present

## 2014-09-13 DIAGNOSIS — Z01810 Encounter for preprocedural cardiovascular examination: Secondary | ICD-10-CM | POA: Diagnosis not present

## 2014-09-13 DIAGNOSIS — Z01818 Encounter for other preprocedural examination: Secondary | ICD-10-CM | POA: Diagnosis not present

## 2014-09-13 DIAGNOSIS — R569 Unspecified convulsions: Secondary | ICD-10-CM | POA: Diagnosis not present

## 2014-09-13 DIAGNOSIS — Z418 Encounter for other procedures for purposes other than remedying health state: Secondary | ICD-10-CM | POA: Diagnosis not present

## 2014-09-13 DIAGNOSIS — C189 Malignant neoplasm of colon, unspecified: Secondary | ICD-10-CM | POA: Diagnosis not present

## 2014-09-13 DIAGNOSIS — C184 Malignant neoplasm of transverse colon: Secondary | ICD-10-CM | POA: Diagnosis not present

## 2014-09-13 DIAGNOSIS — Z944 Liver transplant status: Secondary | ICD-10-CM | POA: Diagnosis not present

## 2014-09-15 DIAGNOSIS — C184 Malignant neoplasm of transverse colon: Secondary | ICD-10-CM | POA: Diagnosis not present

## 2014-09-16 DIAGNOSIS — K660 Peritoneal adhesions (postprocedural) (postinfection): Secondary | ICD-10-CM | POA: Diagnosis present

## 2014-09-16 DIAGNOSIS — Z800 Family history of malignant neoplasm of digestive organs: Secondary | ICD-10-CM | POA: Diagnosis not present

## 2014-09-16 DIAGNOSIS — Z4823 Encounter for aftercare following liver transplant: Secondary | ICD-10-CM | POA: Diagnosis not present

## 2014-09-16 DIAGNOSIS — K739 Chronic hepatitis, unspecified: Secondary | ICD-10-CM | POA: Diagnosis not present

## 2014-09-16 DIAGNOSIS — K432 Incisional hernia without obstruction or gangrene: Secondary | ICD-10-CM | POA: Diagnosis not present

## 2014-09-16 DIAGNOSIS — D123 Benign neoplasm of transverse colon: Secondary | ICD-10-CM | POA: Diagnosis not present

## 2014-09-16 DIAGNOSIS — Z5181 Encounter for therapeutic drug level monitoring: Secondary | ICD-10-CM | POA: Diagnosis not present

## 2014-09-16 DIAGNOSIS — K654 Sclerosing mesenteritis: Secondary | ICD-10-CM | POA: Diagnosis not present

## 2014-09-16 DIAGNOSIS — C184 Malignant neoplasm of transverse colon: Secondary | ICD-10-CM | POA: Diagnosis not present

## 2014-09-16 DIAGNOSIS — C189 Malignant neoplasm of colon, unspecified: Secondary | ICD-10-CM | POA: Diagnosis not present

## 2014-09-16 DIAGNOSIS — Z809 Family history of malignant neoplasm, unspecified: Secondary | ICD-10-CM | POA: Diagnosis not present

## 2014-09-16 DIAGNOSIS — Z944 Liver transplant status: Secondary | ICD-10-CM | POA: Diagnosis not present

## 2014-09-16 DIAGNOSIS — G8918 Other acute postprocedural pain: Secondary | ICD-10-CM | POA: Diagnosis not present

## 2014-09-16 DIAGNOSIS — Z79899 Other long term (current) drug therapy: Secondary | ICD-10-CM | POA: Diagnosis not present

## 2014-09-16 DIAGNOSIS — Z9889 Other specified postprocedural states: Secondary | ICD-10-CM | POA: Diagnosis not present

## 2014-09-16 DIAGNOSIS — Z1509 Genetic susceptibility to other malignant neoplasm: Secondary | ICD-10-CM | POA: Diagnosis not present

## 2014-09-16 DIAGNOSIS — Z418 Encounter for other procedures for purposes other than remedying health state: Secondary | ICD-10-CM | POA: Diagnosis not present

## 2014-09-16 DIAGNOSIS — K759 Inflammatory liver disease, unspecified: Secondary | ICD-10-CM | POA: Diagnosis not present

## 2014-09-17 DIAGNOSIS — C801 Malignant (primary) neoplasm, unspecified: Secondary | ICD-10-CM | POA: Insufficient documentation

## 2014-09-24 ENCOUNTER — Emergency Department (HOSPITAL_COMMUNITY): Payer: Medicare Other

## 2014-09-24 ENCOUNTER — Encounter (HOSPITAL_COMMUNITY): Payer: Self-pay | Admitting: Emergency Medicine

## 2014-09-24 ENCOUNTER — Emergency Department (HOSPITAL_COMMUNITY)
Admission: EM | Admit: 2014-09-24 | Discharge: 2014-09-24 | Disposition: A | Discharge status: Home / Self Care | Payer: Medicare Other | Attending: Emergency Medicine | Admitting: Emergency Medicine

## 2014-09-24 DIAGNOSIS — Z791 Long term (current) use of non-steroidal anti-inflammatories (NSAID): Secondary | ICD-10-CM | POA: Insufficient documentation

## 2014-09-24 DIAGNOSIS — R1031 Right lower quadrant pain: Secondary | ICD-10-CM | POA: Diagnosis not present

## 2014-09-24 DIAGNOSIS — Z792 Long term (current) use of antibiotics: Secondary | ICD-10-CM | POA: Insufficient documentation

## 2014-09-24 DIAGNOSIS — Z9889 Other specified postprocedural states: Secondary | ICD-10-CM | POA: Insufficient documentation

## 2014-09-24 DIAGNOSIS — Z79899 Other long term (current) drug therapy: Secondary | ICD-10-CM | POA: Diagnosis not present

## 2014-09-24 DIAGNOSIS — E079 Disorder of thyroid, unspecified: Secondary | ICD-10-CM | POA: Diagnosis not present

## 2014-09-24 DIAGNOSIS — Z7982 Long term (current) use of aspirin: Secondary | ICD-10-CM | POA: Diagnosis not present

## 2014-09-24 DIAGNOSIS — G8918 Other acute postprocedural pain: Secondary | ICD-10-CM

## 2014-09-24 DIAGNOSIS — B9689 Other specified bacterial agents as the cause of diseases classified elsewhere: Secondary | ICD-10-CM | POA: Diagnosis not present

## 2014-09-24 DIAGNOSIS — Z9089 Acquired absence of other organs: Secondary | ICD-10-CM | POA: Diagnosis not present

## 2014-09-24 DIAGNOSIS — Z8719 Personal history of other diseases of the digestive system: Secondary | ICD-10-CM | POA: Diagnosis not present

## 2014-09-24 DIAGNOSIS — R109 Unspecified abdominal pain: Secondary | ICD-10-CM | POA: Diagnosis present

## 2014-09-24 DIAGNOSIS — I10 Essential (primary) hypertension: Secondary | ICD-10-CM | POA: Insufficient documentation

## 2014-09-24 DIAGNOSIS — N390 Urinary tract infection, site not specified: Secondary | ICD-10-CM | POA: Diagnosis not present

## 2014-09-24 DIAGNOSIS — D649 Anemia, unspecified: Secondary | ICD-10-CM | POA: Diagnosis not present

## 2014-09-24 LAB — COMPREHENSIVE METABOLIC PANEL
ALK PHOS: 416 U/L — AB (ref 39–117)
ALT: 12 U/L (ref 0–35)
ANION GAP: 12 (ref 5–15)
AST: 19 U/L (ref 0–37)
Albumin: 2.5 g/dL — ABNORMAL LOW (ref 3.5–5.2)
BILIRUBIN TOTAL: 0.2 mg/dL — AB (ref 0.3–1.2)
BUN: 10 mg/dL (ref 6–23)
CHLORIDE: 94 meq/L — AB (ref 96–112)
CO2: 31 meq/L (ref 19–32)
Calcium: 8.2 mg/dL — ABNORMAL LOW (ref 8.4–10.5)
Creatinine, Ser: 0.78 mg/dL (ref 0.50–1.10)
GFR calc non Af Amer: 87 mL/min — ABNORMAL LOW (ref >90)
GLUCOSE: 150 mg/dL — AB (ref 70–99)
Potassium: 2.9 mEq/L — CL (ref 3.7–5.3)
SODIUM: 137 meq/L (ref 137–147)
TOTAL PROTEIN: 6.4 g/dL (ref 6.0–8.3)

## 2014-09-24 LAB — URINALYSIS, ROUTINE W REFLEX MICROSCOPIC
BILIRUBIN URINE: NEGATIVE
Glucose, UA: NEGATIVE mg/dL
HGB URINE DIPSTICK: NEGATIVE
KETONES UR: NEGATIVE mg/dL
Nitrite: POSITIVE — AB
Protein, ur: NEGATIVE mg/dL
Specific Gravity, Urine: 1.01 (ref 1.005–1.030)
UROBILINOGEN UA: 0.2 mg/dL (ref 0.0–1.0)
pH: 5.5 (ref 5.0–8.0)

## 2014-09-24 LAB — CBC WITH DIFFERENTIAL/PLATELET
BASOS PCT: 1 % (ref 0–1)
Basophils Absolute: 0.1 10*3/uL (ref 0.0–0.1)
Eosinophils Absolute: 0.3 10*3/uL (ref 0.0–0.7)
Eosinophils Relative: 3 % (ref 0–5)
HCT: 31.9 % — ABNORMAL LOW (ref 36.0–46.0)
Hemoglobin: 10 g/dL — ABNORMAL LOW (ref 12.0–15.0)
LYMPHS ABS: 1.7 10*3/uL (ref 0.7–4.0)
LYMPHS PCT: 14 % (ref 12–46)
MCH: 25.5 pg — ABNORMAL LOW (ref 26.0–34.0)
MCHC: 31.3 g/dL (ref 30.0–36.0)
MCV: 81.4 fL (ref 78.0–100.0)
MONO ABS: 1.4 10*3/uL — AB (ref 0.1–1.0)
Monocytes Relative: 11 % (ref 3–12)
NEUTROS ABS: 9.2 10*3/uL — AB (ref 1.7–7.7)
NEUTROS PCT: 72 % (ref 43–77)
Platelets: 574 10*3/uL — ABNORMAL HIGH (ref 150–400)
RBC: 3.92 MIL/uL (ref 3.87–5.11)
RDW: 17.3 % — ABNORMAL HIGH (ref 11.5–15.5)
WBC: 12.7 10*3/uL — AB (ref 4.0–10.5)

## 2014-09-24 LAB — URINE MICROSCOPIC-ADD ON

## 2014-09-24 MED ORDER — SODIUM CHLORIDE 0.9 % IV BOLUS (SEPSIS)
500.0000 mL | Freq: Once | INTRAVENOUS | Status: AC
  Administered 2014-09-24: 500 mL via INTRAVENOUS

## 2014-09-24 MED ORDER — POTASSIUM CHLORIDE 10 MEQ/100ML IV SOLN
10.0000 meq | Freq: Once | INTRAVENOUS | Status: AC
  Administered 2014-09-24: 10 meq via INTRAVENOUS
  Filled 2014-09-24: qty 100

## 2014-09-24 MED ORDER — IOHEXOL 300 MG/ML  SOLN
50.0000 mL | Freq: Once | INTRAMUSCULAR | Status: AC | PRN
  Administered 2014-09-24: 50 mL via ORAL

## 2014-09-24 MED ORDER — OXYCODONE-ACETAMINOPHEN 5-325 MG PO TABS: 1.0000 | ORAL_TABLET | ORAL | Status: DC | PRN

## 2014-09-24 MED ORDER — DEXTROSE 5 % IV SOLN: 1.0000 g | Freq: Once | INTRAVENOUS | Status: DC

## 2014-09-24 MED ORDER — CIPROFLOXACIN HCL 250 MG PO TABS
500.0000 mg | ORAL_TABLET | Freq: Once | ORAL | Status: AC
  Administered 2014-09-24: 500 mg via ORAL
  Filled 2014-09-24: qty 2

## 2014-09-24 MED ORDER — ONDANSETRON HCL 4 MG/2ML IJ SOLN
4.0000 mg | Freq: Once | INTRAMUSCULAR | Status: AC
  Administered 2014-09-24: 4 mg via INTRAMUSCULAR
  Filled 2014-09-24: qty 2

## 2014-09-24 MED ORDER — HYDROCODONE-ACETAMINOPHEN 5-325 MG PO TABS: 1.0000 | ORAL_TABLET | ORAL | Status: DC | PRN

## 2014-09-24 MED ORDER — HYDROMORPHONE HCL 1 MG/ML IJ SOLN
1.0000 mg | Freq: Once | INTRAMUSCULAR | Status: AC
  Administered 2014-09-24: 1 mg via INTRAMUSCULAR
  Filled 2014-09-24: qty 1

## 2014-09-24 MED ORDER — CIPROFLOXACIN HCL 500 MG PO TABS: 500.0000 mg | ORAL_TABLET | Freq: Two times a day (BID) | ORAL | Status: DC

## 2014-09-24 MED ORDER — IOHEXOL 300 MG/ML  SOLN: 100.0000 mL | Freq: Once | INTRAMUSCULAR | Status: DC | PRN

## 2014-09-24 MED ORDER — IOHEXOL 300 MG/ML  SOLN
100.0000 mL | Freq: Once | INTRAMUSCULAR | Status: AC | PRN
  Administered 2014-09-24: 100 mL via INTRAVENOUS

## 2014-09-24 MED ORDER — HYDROMORPHONE HCL 1 MG/ML IJ SOLN
1.0000 mg | Freq: Once | INTRAMUSCULAR | Status: AC
  Administered 2014-09-24: 1 mg via INTRAVENOUS
  Filled 2014-09-24: qty 1

## 2014-09-24 NOTE — ED Notes (Signed)
PA notified pt would like to see her.

## 2014-09-24 NOTE — ED Notes (Signed)
Received call from [place], [phone] nurse transplant coordinator at [facility]. Pt had liver transplant at [facility] in January 2015. Pt had colon resection (d/t colon ca), hysterectomy and hernia repair on 09/16/14 at [facility].   Pt sent to ED for evaluation of severe lower abd pain. If transfer to [facility] needed transfer coordinator # [phone].

## 2014-09-24 NOTE — ED Provider Notes (Signed)
Medical screening examination/treatment/procedure(s) were conducted as a shared visit with non-physician practitioner(s) and myself.  I personally evaluated the patient during the encounter.   EKG Interpretation None      Pt is a 63 y.o. F with history of hypertension, liver cirrhosis status post liver transplant at [facility] who recently underwent partial colectomy, hysterectomy and hernia repair one week ago. Presenting with lower abdominal pain.  Patient reports pain has been present since she was discharged. No fevers, chills, nausea, vomiting or diarrhea. She reports that she feels the Vicodin she was discharged home with is not helping. On exam, patient's abdominal exam is very benign. Her surgical incision is clean, dry and intact without drainage. Labs show mild leukocytosis and mild hypokalemia. We'll replace potassium. CT of her abdomen and pelvis shows a left-sided pleural effusion patient denies shortness of breath and has no hypoxia. She also has signs suggestive of postop changes. She also appears to have a urinary tract infection. We'll treat her UTI. She has follow-up with her surgeon in 2 days. I feel she is safe to be discharged home. Patient and her husband are comfortable with this plan.  [place], DO 09/24/14 [phone]

## 2014-09-24 NOTE — Discharge Instructions (Signed)
Urinary Tract Infection A urinary tract infection (UTI) can occur any place along the urinary tract. The tract includes the kidneys, ureters, bladder, and urethra. A type of germ called bacteria often causes a UTI. UTIs are often helped with antibiotic medicine.  HOME CARE   If given, take antibiotics as told by your doctor. Finish them even if you start to feel better.  Drink enough fluids to keep your pee (urine) clear or pale yellow.  Avoid tea, drinks with caffeine, and bubbly (carbonated) drinks.  Pee often. Avoid holding your pee in for a long time.  Pee before and after having sex (intercourse).  Wipe from front to back after you poop (bowel movement) if you are a woman. Use each tissue only once. GET HELP RIGHT AWAY IF:   You have back pain.  You have lower belly (abdominal) pain.  You have chills.  You feel sick to your stomach (nauseous).  You throw up (vomit).  Your burning or discomfort with peeing does not go away.  You have a fever.  Your symptoms are not better in 3 days. MAKE SURE YOU:   Understand these instructions.  Will watch your condition.  Will get help right away if you are not doing well or get worse. Document Released: 04/16/2008 Document Revised: 07/23/2012 Document Reviewed: 05/29/2012 [facility] Patient Information 2015 [place], [place]. This information is not intended to replace advice given to you by your health care provider. Make sure you discuss any questions you have with your health care provider.  Pain Relief Preoperatively and Postoperatively Being a good patient does not mean being a silent one.If you have questions, problems, or concerns about the pain you may feel after surgery, let your caregiver know.Patients have the right to assessment and management of pain. The treatment of pain after surgery is important to speed up recovery and return to normal activities. Severe pain after surgery, and the fear or anxiety associated with  that pain, may cause extreme discomfort that:  Prevents sleep.  Decreases the ability to breathe deeply and cough. This can cause pneumonia or other upper airway infections.  Causes your heart to beat faster and your blood pressure to be higher.  Increases the risk for constipation and bloating.  Decreases the ability of wounds to heal.  May result in depression, increased anxiety, and feelings of helplessness. Relief of pain before surgery is also important because it will lessen the pain after surgery. Patients who receive both pain relief before and after surgery experience greater pain relief than those who only receive pain relief after surgery. Let your caregiver know if you are having uncontrolled pain.This is very important.Pain after surgery is more difficult to manage if it is permitted to become severe, so prompt and adequate treatment of acute pain is necessary. PAIN CONTROL METHODS Your caregivers follow policies and procedures about the management of patient pain.These guidelines should be explained to you before surgery.Plans for pain control after surgery must be mutually decided upon and instituted with your full understanding and agreement.Do not be afraid to ask questions regarding the care you are receiving.There are many different ways your caregivers will attempt to control your pain, including the following methods. As needed pain control  You may be given pain medicine either through your intravenous (IV) tube, or as a pill or liquid you can swallow. You will need to let your caregiver know when you are having pain. Then, your caregiver will give you the pain medicine ordered for you.  Your pain medicine may make you constipated. If constipation occurs, drink more liquids if you can. Your caregiver may have you take a mild laxative. IV patient-controlled analgesia pump (PCA pump)  You can get your pain medicine through the IV tube which goes into your vein. You  are able to control the amount of pain medicine that you get. The pain medicine flows in through an IV tube and is controlled by a pump. This pump gives you a set amount of pain medicine when you push the button hooked up to it. Nobody should push this button but you or someone specifically assigned by you to do so. It is set up to keep you from accidentally giving yourself too much pain medicine. You will be able to start using your pain pump in the recovery room after your surgery. This method can be helpful for most types of surgery.  If you are still having too much pain, tell your caregiver. Also, tell your caregiver if you are feeling too sleepy or nauseous. Continuous epidural pain control  A thin, soft tube (catheter) is put into your back. Pain medicine flows through the catheter to lessen pain in the part of your body where the surgery is done. Continuous epidural pain control may work best for you if you are having surgery on your chest, abdomen, hip area, or legs. The epidural catheter is usually put into your back just before surgery. The catheter is left in until you can eat and take medicine by mouth. In most cases, this may take 2 to 3 days.  Giving pain medicine through the epidural catheter may help you heal faster because:  Your bowel gets back to normal faster.  You can get back to eating sooner.  You can be up and walking sooner. Medicine that numbs the area (local anesthetic)  You may receive an injection of pain medicine near where the pain is (local infiltration).  You may receive an injection of pain medicine near the nerve that controls the sensation to a specific part of the body (peripheral nerve block).  Medicine may be put in the spine to block pain (spinal block). Opioids  Moderate to moderately severe acute pain after surgery may respond to opioids.Opioids are narcotic pain medicine. Opioids are often combined with non-narcotic medicines to improve pain relief,  diminish the risk of side effects, and reduce the chance of addiction.  If you follow your caregiver's directions about taking opioids and you do not have a history of substance abuse, your risk of becoming addicted is exceptionally small.Opioids are given for short periods of time in careful doses to prevent addiction. Other methods of pain control include:  Steroids.  Physical therapy.  Heat and cold therapy.  Compression, such as wrapping an elastic bandage around the area of pain.  Massage. These various ways of controlling pain may be used together. Combining different methods of pain control is called multimodal analgesia. Using this approach has many benefits, including being able to eat, move around, and leave the hospital sooner. Document Released: 01/19/2003 Document Revised: 01/21/2012 Document Reviewed: 01/23/2011 [facility] Patient Information 2015 [place], [place]. This information is not intended to replace advice given to you by your health care provider. Make sure you discuss any questions you have with your health care provider.

## 2014-09-24 NOTE — ED Notes (Signed)
CRITICAL VALUE ALERT  Critical value received:  Potassium 2.9  Date of notification:  09/24/14  Time of notification:  0626  Critical value read back:Yes.    Nurse who received alert:  [email or web address]  MD notified (1st page):  Idol,J.  Time of first page:  1015  MD notified (2nd page):  Time of second page:  Responding MD:  [email or web address].[email or web address]  Time MD responded:  9485

## 2014-09-24 NOTE — ED Notes (Signed)
CT made aware pt finished po contrast.

## 2014-09-24 NOTE — ED Provider Notes (Signed)
CSN: [id number]     Arrival date & time 09/24/14  4098 History   First MD Initiated Contact with Patient 09/24/14 [phone]     Chief Complaint  Patient presents with  . Abdominal Pain     (Consider location/radiation/quality/duration/timing/severity/associated sxs/prior Treatment) The history is provided by the patient and the spouse.   Cassidy Barber is a 63 y.o. female with past medical history of hypertension and cirrhosis status post liver transplant January 2015 at [facility], new diagnosis of colon cancer who underwent partial colectomy in addition to hernia repair and total hysterectomy 1 week ago at [facility].  She was discharged 5 days ago with pain symptoms fairly well controlled with hydrocodone.  Over the past 3 days she's developed increasing lower abdominal pain which is worsened with bowel movements.  Her pain is constant but describes sharp stabbing pain with bowel movements which have been watery to soft, dark green in color, nonbloody.  She denies fevers or chills, she does have nausea without emesis.  She was able to drink an Ensure this morning which did not worsen her symptoms.she denies shortness of breath, chest pain, cough, dysuria.  She contacted her surgeon at [facility] and was referred here for further evaluation.  She took a hydrocodone early this morning without symptom relief.  She endorses general weakness and fatigue.  Husband states she has dropped 10 pounds since her discharge home, although states she had fluid retention when she was discharged.  She takes Lasix twice a day.    Past Medical History  Diagnosis Date  . Liver disease   . Hernia of unspecified site of abdominal cavity without mention of obstruction or gangrene   . Thyroid disease   . Hypertension   . Primary biliary cirrhosis 10/26/2013  . Anemia of chronic disease 10/26/2013   Past Surgical History  Procedure Laterality Date  . Abdominal hernia repair      Patient's states that she has had 8- 9 hernia surgeries   . Splenectomy  2006  . Cholecystectomy  2007  . Colonoscopy      Done at [facility]  . Upper gastrointestinal endoscopy      Done at [facility]  . Colon surgery  2008    Done at [facility]  . Hernia repair    . Liver transplant  [id number]  . Colon resection    . Abdominal hysterectomy     Family History  Problem Relation Age of Onset  . Prostate cancer Father   . Colon cancer Sister   . Healthy Son    History  Substance Use Topics  . Smoking status: Never Smoker   . Smokeless tobacco: Never Used  . Alcohol Use: No   OB History    No data available     Review of Systems  Constitutional: Positive for fatigue. Negative for fever and chills.  HENT: Negative for congestion and sore throat.   Eyes: Negative.   Respiratory: Negative for chest tightness and shortness of breath.   Cardiovascular: Negative for chest pain.  Gastrointestinal: Positive for abdominal pain and diarrhea. Negative for nausea and vomiting.  Genitourinary: Negative.   Musculoskeletal: Negative for joint swelling, arthralgias and neck pain.  Skin: Negative.  Negative for rash and wound.  Neurological: Positive for weakness. Negative for dizziness, light-headedness, numbness and headaches.  Psychiatric/Behavioral: Negative.       Allergies  Codeine and Oxycodone  Home Medications   Prior to Admission medications   Medication Sig Start Date End Date Taking? Authorizing  Provider  acetaminophen (TYLENOL) 325 MG tablet Take 650 mg by mouth every 6 (six) hours as needed for mild pain or moderate pain.    Yes Historical Provider, MD  ALPRAZolam Duanne Moron) 0.5 MG tablet Take 0.5 mg by mouth at bedtime as needed for anxiety or sleep.   Yes Historical Provider, MD  aspirin 325 MG tablet Take 325 mg by mouth daily.   Yes Historical Provider, MD  docusate sodium (COLACE) 100 MG capsule Take 100 mg by mouth at bedtime as needed for mild constipation.   Yes Historical Provider, MD  ferrous sulfate 325 (65 FE) MG tablet Take 325 mg by  mouth 2 (two) times daily with a meal.   Yes Historical Provider, MD  furosemide (LASIX) 40 MG tablet Take 20-40 mg by mouth 2 (two) times daily. 40 mg in the morning and 20 mg in the evening.   Yes Historical Provider, MD  hydrOXYzine (VISTARIL) 25 MG capsule Take 25 mg by mouth 3 (three) times daily.   Yes Historical Provider, MD  levETIRAcetam (KEPPRA) 500 MG tablet Take 500 mg by mouth 2 (two) times daily.   Yes Historical Provider, MD  levothyroxine (SYNTHROID, LEVOTHROID) 150 MCG tablet Take 150 mcg by mouth daily.   Yes Historical Provider, MD  losartan (COZAAR) 25 MG tablet Take 25 mg by mouth daily.   Yes Historical Provider, MD  metoprolol succinate (TOPROL-XL) 25 MG 24 hr tablet Take 25 mg by mouth daily.   Yes Historical Provider, MD  naproxen sodium (ANAPROX) 220 MG tablet Take 440 mg by mouth once.   Yes Historical Provider, MD  pantoprazole (PROTONIX) 40 MG tablet Take 40 mg by mouth daily.   Yes Historical Provider, MD  potassium chloride SA (K-DUR,KLOR-CON) 20 MEQ tablet Take 20 mEq by mouth daily.   Yes Historical Provider, MD  senna (SENOKOT) 8.6 MG TABS tablet Take 1 tablet by mouth daily as needed for mild constipation.   Yes Historical Provider, MD  tacrolimus (PROGRAF) 1 MG capsule Take 2 mg by mouth 2 (two) times daily.   Yes Historical Provider, MD  ursodiol (ACTIGALL) 300 MG capsule Take 900 mg by mouth 2 (two) times daily. The patient states that she is taking 3 in the morning and 3 in the evening   Yes Historical Provider, MD  zolpidem (AMBIEN) 5 MG tablet Take 5 mg by mouth at bedtime as needed for sleep.   Yes Historical Provider, MD  ALPRAZolam (XANAX) 0.25 MG tablet Take 1 tablet (0.25 mg total) by mouth 2 (two) times daily as needed for anxiety. Patient not taking: Reported on 09/24/2014 03/26/14   [name], FNP  ciprofloxacin (CIPRO) 500 MG tablet Take 1 tablet (500 mg total) by mouth 2 (two) times daily. 09/24/14   Evalee Jefferson, PA-C  HYDROcodone-acetaminophen  (NORCO/VICODIN) 5-325 MG per tablet Take 1 tablet by mouth every 4 (four) hours as needed. 09/24/14   Evalee Jefferson, PA-C  oxyCODONE-acetaminophen (PERCOCET/ROXICET) 5-325 MG per tablet Take 1 tablet by mouth every 4 (four) hours as needed. 09/24/14   Evalee Jefferson, PA-C  sildenafil (REVATIO) 20 MG tablet Take 20 mg by mouth 3 (three) times daily.    Historical Provider, MD  traMADol (ULTRAM) 50 MG tablet Take by mouth every 6 (six) hours as needed.    Historical Provider, MD  valGANciclovir (VALCYTE) 450 MG tablet Take by mouth daily.    Historical Provider, MD   BP 140/76 mmHg  Pulse 86  Temp(Src) 98.3 F (36.8 C) (Oral)  Resp 18  Ht 5\' 4"  (1.626 m)  Wt 143 lb (64.864 kg)  BMI 24.53 kg/m2  SpO2 99% Physical Exam  Constitutional: She appears well-developed and well-nourished. She appears distressed.  Patient appears uncomfortable.  HENT:  Head: Normocephalic and atraumatic.  Dry buccal mucosa  Eyes: Conjunctivae are normal.  Neck: Normal range of motion.  Cardiovascular: Normal rate, regular rhythm, normal heart sounds and intact distal pulses.   Pulmonary/Chest: Effort normal and breath sounds normal. She has no wheezes.  Abdominal: Soft. Bowel sounds are normal. She exhibits no distension. There is tenderness in the right lower quadrant and suprapubic area. There is no rigidity, no rebound and no guarding.  Well appearing midline surgical incision with staples in place.  Dressing over right lower abdomen.  Musculoskeletal: Normal range of motion.  Neurological: She is alert.  Skin: Skin is warm and dry.  Psychiatric: She has a normal mood and affect.  Nursing note and vitals reviewed.   ED Course  Procedures (including critical care time) Labs Review Labs Reviewed  CBC WITH DIFFERENTIAL - Abnormal; Notable for the following:    WBC 12.7 (*)    Hemoglobin 10.0 (*)    HCT 31.9 (*)    MCH 25.5 (*)    RDW 17.3 (*)    Platelets 574 (*)    Neutro Abs 9.2 (*)    Monocytes Absolute  1.4 (*)    All other components within normal limits  COMPREHENSIVE METABOLIC PANEL - Abnormal; Notable for the following:    Potassium 2.9 (*)    Chloride 94 (*)    Glucose, Bld 150 (*)    Calcium 8.2 (*)    Albumin 2.5 (*)    Alkaline Phosphatase 416 (*)    Total Bilirubin 0.2 (*)    GFR calc non Af Amer 87 (*)    All other components within normal limits  URINALYSIS, ROUTINE W REFLEX MICROSCOPIC - Abnormal; Notable for the following:    Nitrite POSITIVE (*)    Leukocytes, UA TRACE (*)    All other components within normal limits  URINE MICROSCOPIC-ADD ON - Abnormal; Notable for the following:    Squamous Epithelial / LPF MANY (*)    Bacteria, UA MANY (*)    All other components within normal limits    Imaging Review Ct Abdomen Pelvis W Contrast  09/24/2014   CLINICAL DATA:  Postoperative right lower quadrant abdominal pain following colon resection and hysterectomy  EXAM: CT ABDOMEN AND PELVIS WITH CONTRAST  TECHNIQUE: Multidetector CT imaging of the abdomen and pelvis was performed using the standard protocol following bolus administration of intravenous contrast.  CONTRAST:  53mL OMNIPAQUE IOHEXOL 300 MG/ML SOLN, 117mL OMNIPAQUE IOHEXOL 300 MG/ML SOLN  COMPARISON:  09/20/2011.  FINDINGS: Lung bases show with findings consistent with a large left-sided pleural effusion. Some associated atelectatic changes are noted.  There are changes consistent with the known history of liver transplant. No focal mass lesion is noted. The gallbladder is been surgically removed. The spleen has been removed and a small accessory splenule is noted in the left upper quadrant stable from previous exams. Kidneys demonstrate a normal enhancement pattern bilaterally without focal mass lesion or calculi. Normal excretion of contrast material is noted bilaterally. The pancreas is well visualized and within normal limits.  Free fluid is noted within the pelvis but to a greater degree in the upper abdomen  particular posterior to the liver transplant on the right underneath the dome of the diaphragm. These changes are  likely postoperative in nature. No extravasated contrast is noted. Additionally a subcutaneous fluid collection is noted along the anterior abdominal wall containing some air. This may simply represent a resolving postoperative seroma although the possibility of abscess could not be totally excluded given air within. It measures approximately 9.1 x 1.7 cm in greatest transverse and AP dimensions respectively. It extends for approximately 5 cm in the craniocaudad projection. A few mildly dilated loops of small bowel are noted although no definitive obstructive changes are seen. The anastomotic site in the right lower quadrant appears widely patent.  The bladder is well distended. No filling defect is seen. No significant lymphadenopathy is noted. Mild aortoiliac calcifications are seen. The uterus is not seen consistent with the given clinical history. The bony structures show no acute abnormality. Bilateral pars defects are noted at L5-S1 with grade 2 anterolisthesis which is stable from the prior exam.  IMPRESSION: Postoperative changes are noted consistent with the patient's known history of prior liver transplant as well as recent colonic resection, hysterectomy and hernia repair.  Free fluid is noted within the abdomen a portion which may be postoperative in nature. This is predominantly located in the right upper quadrant posterior to the liver transplant.  Large left pleural effusion.  Mild dilatation of the small bowel although anastomotic sites appear widely patent.  Air-fluid collection in the subcutaneous tissues in the anterior abdominal wall. This may simply represent a postoperative seroma although the possibility of a developing abscess cannot be totally excluded.   Electronically Signed   By: Inez Catalina M.D.   On: 09/24/2014 12:31     EKG Interpretation None      MDM   Final  diagnoses:  Post-op pain  UTI (lower urinary tract infection)    Patients labs and/or radiological studies were viewed and considered during the medical decision making and disposition process. Pt with post op pain, uti and hypokalemia, with pain improved at time of dc.  She was given an IV run of potassium.  Patient and husband endorse that she has her presurgical Lasix but not her potassium since arriving home and will start taking this daily.  She was prescribed Cipro for her UTI.  Prescribed hydrocodone for pain relief.  She has follow-up with her surgeon in 3 days.  Patient was advised to return here or call her surgeon for any worsening symptoms over the weekend.  Discussed CT results with patient, most findings suggesting postop changes.  She denies shortness of breath and chest pain, upon there is pleural effusion on her CT scan.  She ambulated in the department without shortness of breath or desaturation.  She refuses plain film chest x-ray today.  She was encouraged to follow this up with history her surgeon in 3 days as planned, returning here for any worsening symptoms.    Evalee Jefferson, PA-C 09/24/14 [phone]

## 2014-09-24 NOTE — ED Notes (Addendum)
Pt reports sent from [facility] for RLQ pain for several days. Pt reports pain is more intense with BM. Pt denies n/v/d.

## 2014-09-27 DIAGNOSIS — G8918 Other acute postprocedural pain: Secondary | ICD-10-CM | POA: Diagnosis not present

## 2014-09-27 DIAGNOSIS — Z944 Liver transplant status: Secondary | ICD-10-CM | POA: Diagnosis not present

## 2014-09-27 DIAGNOSIS — Z418 Encounter for other procedures for purposes other than remedying health state: Secondary | ICD-10-CM | POA: Diagnosis not present

## 2014-09-27 DIAGNOSIS — Z483 Aftercare following surgery for neoplasm: Secondary | ICD-10-CM | POA: Diagnosis not present

## 2014-09-27 DIAGNOSIS — C189 Malignant neoplasm of colon, unspecified: Secondary | ICD-10-CM | POA: Diagnosis not present

## 2014-09-27 DIAGNOSIS — Z09 Encounter for follow-up examination after completed treatment for conditions other than malignant neoplasm: Secondary | ICD-10-CM | POA: Diagnosis not present

## 2014-10-04 ENCOUNTER — Encounter (HOSPITAL_COMMUNITY): Payer: Self-pay | Admitting: Emergency Medicine

## 2014-10-04 ENCOUNTER — Emergency Department (HOSPITAL_COMMUNITY)
Admission: EM | Admit: 2014-10-04 | Discharge: 2014-10-04 | Disposition: A | Discharge status: Other Acute Inpatient Hospital | Payer: Medicare Other | Attending: Emergency Medicine | Admitting: Emergency Medicine

## 2014-10-04 ENCOUNTER — Emergency Department (HOSPITAL_COMMUNITY): Payer: Medicare Other

## 2014-10-04 DIAGNOSIS — R1084 Generalized abdominal pain: Secondary | ICD-10-CM | POA: Diagnosis not present

## 2014-10-04 DIAGNOSIS — Z90722 Acquired absence of ovaries, bilateral: Secondary | ICD-10-CM | POA: Diagnosis present

## 2014-10-04 DIAGNOSIS — K3189 Other diseases of stomach and duodenum: Secondary | ICD-10-CM | POA: Diagnosis present

## 2014-10-04 DIAGNOSIS — A088 Other specified intestinal infections: Secondary | ICD-10-CM | POA: Diagnosis not present

## 2014-10-04 DIAGNOSIS — K31819 Angiodysplasia of stomach and duodenum without bleeding: Secondary | ICD-10-CM | POA: Diagnosis present

## 2014-10-04 DIAGNOSIS — I272 Other secondary pulmonary hypertension: Secondary | ICD-10-CM | POA: Diagnosis not present

## 2014-10-04 DIAGNOSIS — Z8719 Personal history of other diseases of the digestive system: Secondary | ICD-10-CM | POA: Diagnosis not present

## 2014-10-04 DIAGNOSIS — Z9049 Acquired absence of other specified parts of digestive tract: Secondary | ICD-10-CM | POA: Diagnosis present

## 2014-10-04 DIAGNOSIS — Z85038 Personal history of other malignant neoplasm of large intestine: Secondary | ICD-10-CM | POA: Diagnosis not present

## 2014-10-04 DIAGNOSIS — R188 Other ascites: Secondary | ICD-10-CM | POA: Diagnosis not present

## 2014-10-04 DIAGNOSIS — Z800 Family history of malignant neoplasm of digestive organs: Secondary | ICD-10-CM | POA: Diagnosis not present

## 2014-10-04 DIAGNOSIS — Z9081 Acquired absence of spleen: Secondary | ICD-10-CM | POA: Diagnosis present

## 2014-10-04 DIAGNOSIS — K831 Obstruction of bile duct: Secondary | ICD-10-CM | POA: Diagnosis not present

## 2014-10-04 DIAGNOSIS — D649 Anemia, unspecified: Secondary | ICD-10-CM | POA: Diagnosis not present

## 2014-10-04 DIAGNOSIS — I10 Essential (primary) hypertension: Secondary | ICD-10-CM | POA: Diagnosis not present

## 2014-10-04 DIAGNOSIS — Z90710 Acquired absence of both cervix and uterus: Secondary | ICD-10-CM | POA: Diagnosis not present

## 2014-10-04 DIAGNOSIS — K839 Disease of biliary tract, unspecified: Secondary | ICD-10-CM | POA: Diagnosis not present

## 2014-10-04 DIAGNOSIS — T8649 Other complications of liver transplant: Secondary | ICD-10-CM | POA: Diagnosis not present

## 2014-10-04 DIAGNOSIS — R748 Abnormal levels of other serum enzymes: Secondary | ICD-10-CM | POA: Insufficient documentation

## 2014-10-04 DIAGNOSIS — R7989 Other specified abnormal findings of blood chemistry: Secondary | ICD-10-CM | POA: Diagnosis not present

## 2014-10-04 DIAGNOSIS — D72829 Elevated white blood cell count, unspecified: Secondary | ICD-10-CM | POA: Diagnosis not present

## 2014-10-04 DIAGNOSIS — R109 Unspecified abdominal pain: Secondary | ICD-10-CM | POA: Diagnosis not present

## 2014-10-04 DIAGNOSIS — K9189 Other postprocedural complications and disorders of digestive system: Secondary | ICD-10-CM | POA: Diagnosis not present

## 2014-10-04 DIAGNOSIS — Z792 Long term (current) use of antibiotics: Secondary | ICD-10-CM | POA: Diagnosis not present

## 2014-10-04 DIAGNOSIS — Z7982 Long term (current) use of aspirin: Secondary | ICD-10-CM | POA: Diagnosis not present

## 2014-10-04 DIAGNOSIS — G8918 Other acute postprocedural pain: Secondary | ICD-10-CM | POA: Diagnosis not present

## 2014-10-04 DIAGNOSIS — Z944 Liver transplant status: Secondary | ICD-10-CM | POA: Insufficient documentation

## 2014-10-04 DIAGNOSIS — Z4823 Encounter for aftercare following liver transplant: Secondary | ICD-10-CM | POA: Diagnosis not present

## 2014-10-04 DIAGNOSIS — Z86010 Personal history of colonic polyps: Secondary | ICD-10-CM | POA: Diagnosis not present

## 2014-10-04 DIAGNOSIS — K766 Portal hypertension: Secondary | ICD-10-CM | POA: Diagnosis not present

## 2014-10-04 DIAGNOSIS — G40909 Epilepsy, unspecified, not intractable, without status epilepticus: Secondary | ICD-10-CM | POA: Diagnosis present

## 2014-10-04 DIAGNOSIS — Z5181 Encounter for therapeutic drug level monitoring: Secondary | ICD-10-CM | POA: Diagnosis not present

## 2014-10-04 DIAGNOSIS — E079 Disorder of thyroid, unspecified: Secondary | ICD-10-CM | POA: Diagnosis not present

## 2014-10-04 DIAGNOSIS — Z79891 Long term (current) use of opiate analgesic: Secondary | ICD-10-CM | POA: Diagnosis not present

## 2014-10-04 DIAGNOSIS — Z4659 Encounter for fitting and adjustment of other gastrointestinal appliance and device: Secondary | ICD-10-CM | POA: Diagnosis not present

## 2014-10-04 DIAGNOSIS — J90 Pleural effusion, not elsewhere classified: Secondary | ICD-10-CM | POA: Diagnosis not present

## 2014-10-04 DIAGNOSIS — Z418 Encounter for other procedures for purposes other than remedying health state: Secondary | ICD-10-CM | POA: Diagnosis not present

## 2014-10-04 DIAGNOSIS — R17 Unspecified jaundice: Secondary | ICD-10-CM | POA: Insufficient documentation

## 2014-10-04 DIAGNOSIS — Z885 Allergy status to narcotic agent status: Secondary | ICD-10-CM | POA: Diagnosis not present

## 2014-10-04 DIAGNOSIS — Z881 Allergy status to other antibiotic agents status: Secondary | ICD-10-CM | POA: Diagnosis not present

## 2014-10-04 DIAGNOSIS — E039 Hypothyroidism, unspecified: Secondary | ICD-10-CM | POA: Diagnosis present

## 2014-10-04 DIAGNOSIS — Z79899 Other long term (current) drug therapy: Secondary | ICD-10-CM | POA: Insufficient documentation

## 2014-10-04 DIAGNOSIS — N390 Urinary tract infection, site not specified: Secondary | ICD-10-CM | POA: Diagnosis not present

## 2014-10-04 DIAGNOSIS — Z940 Kidney transplant status: Secondary | ICD-10-CM | POA: Diagnosis not present

## 2014-10-04 LAB — COMPREHENSIVE METABOLIC PANEL
ALBUMIN: 2.3 g/dL — AB (ref 3.5–5.2)
ALT: 22 U/L (ref 0–35)
ANION GAP: 13 (ref 5–15)
AST: 51 U/L — ABNORMAL HIGH (ref 0–37)
Alkaline Phosphatase: 1026 U/L — ABNORMAL HIGH (ref 39–117)
BUN: 15 mg/dL (ref 6–23)
CO2: 28 mEq/L (ref 19–32)
CREATININE: 1.07 mg/dL (ref 0.50–1.10)
Calcium: 9 mg/dL (ref 8.4–10.5)
Chloride: 93 mEq/L — ABNORMAL LOW (ref 96–112)
GFR calc non Af Amer: 54 mL/min — ABNORMAL LOW (ref >90)
GFR, EST AFRICAN AMERICAN: 63 mL/min — AB (ref >90)
Glucose, Bld: 106 mg/dL — ABNORMAL HIGH (ref 70–99)
Potassium: 4.1 mEq/L (ref 3.7–5.3)
Sodium: 134 mEq/L — ABNORMAL LOW (ref 137–147)
TOTAL PROTEIN: 7.8 g/dL (ref 6.0–8.3)
Total Bilirubin: 2.7 mg/dL — ABNORMAL HIGH (ref 0.3–1.2)

## 2014-10-04 LAB — CBC WITH DIFFERENTIAL/PLATELET
Basophils Absolute: 0.3 10*3/uL — ABNORMAL HIGH (ref 0.0–0.1)
Basophils Relative: 2 % — ABNORMAL HIGH (ref 0–1)
Eosinophils Absolute: 1 10*3/uL — ABNORMAL HIGH (ref 0.0–0.7)
Eosinophils Relative: 9 % — ABNORMAL HIGH (ref 0–5)
HEMATOCRIT: 32.9 % — AB (ref 36.0–46.0)
HEMOGLOBIN: 10.2 g/dL — AB (ref 12.0–15.0)
LYMPHS PCT: 23 % (ref 12–46)
Lymphs Abs: 2.7 10*3/uL (ref 0.7–4.0)
MCH: 25.8 pg — ABNORMAL LOW (ref 26.0–34.0)
MCHC: 31 g/dL (ref 30.0–36.0)
MCV: 83.3 fL (ref 78.0–100.0)
MONO ABS: 1.5 10*3/uL — AB (ref 0.1–1.0)
Monocytes Relative: 13 % — ABNORMAL HIGH (ref 3–12)
NEUTROS ABS: 6.2 10*3/uL (ref 1.7–7.7)
NEUTROS PCT: 53 % (ref 43–77)
Platelets: 962 10*3/uL (ref 150–400)
RBC: 3.95 MIL/uL (ref 3.87–5.11)
RDW: 17.9 % — ABNORMAL HIGH (ref 11.5–15.5)
WBC: 11.7 10*3/uL — AB (ref 4.0–10.5)

## 2014-10-04 LAB — URINALYSIS, ROUTINE W REFLEX MICROSCOPIC
Glucose, UA: NEGATIVE mg/dL
HGB URINE DIPSTICK: NEGATIVE
Ketones, ur: NEGATIVE mg/dL
Leukocytes, UA: NEGATIVE
Nitrite: NEGATIVE
Protein, ur: NEGATIVE mg/dL
SPECIFIC GRAVITY, URINE: 1.02 (ref 1.005–1.030)
UROBILINOGEN UA: 0.2 mg/dL (ref 0.0–1.0)
pH: 5.5 (ref 5.0–8.0)

## 2014-10-04 LAB — LIPASE, BLOOD: Lipase: 7 U/L — ABNORMAL LOW (ref 11–59)

## 2014-10-04 MED ORDER — IOHEXOL 300 MG/ML  SOLN
50.0000 mL | Freq: Once | INTRAMUSCULAR | Status: AC | PRN
  Administered 2014-10-04: 50 mL via ORAL

## 2014-10-04 MED ORDER — ONDANSETRON HCL 4 MG/2ML IJ SOLN
4.0000 mg | Freq: Once | INTRAMUSCULAR | Status: DC
Start: 2014-10-04 — End: 2014-10-04

## 2014-10-04 MED ORDER — ONDANSETRON HCL 4 MG/2ML IJ SOLN
4.0000 mg | Freq: Once | INTRAMUSCULAR | Status: AC
  Administered 2014-10-04: 4 mg via INTRAVENOUS
  Filled 2014-10-04: qty 2

## 2014-10-04 MED ORDER — HYDROMORPHONE HCL 1 MG/ML IJ SOLN
1.0000 mg | Freq: Once | INTRAMUSCULAR | Status: AC
Start: 2014-10-04 — End: 2014-10-04
  Administered 2014-10-04: 1 mg via INTRAVENOUS
  Filled 2014-10-04: qty 1

## 2014-10-04 MED ORDER — SODIUM CHLORIDE 0.9 % IV SOLN
Freq: Once | INTRAVENOUS | Status: AC
  Administered 2014-10-04: 11:00:00 via INTRAVENOUS

## 2014-10-04 MED ORDER — SODIUM CHLORIDE 0.9 % IV BOLUS (SEPSIS)
1000.0000 mL | Freq: Once | INTRAVENOUS | Status: AC
  Administered 2014-10-04: 1000 mL via INTRAVENOUS

## 2014-10-04 MED ORDER — HYDROMORPHONE HCL 2 MG/ML IJ SOLN
INTRAMUSCULAR | Status: AC
  Filled 2014-10-04: qty 1

## 2014-10-04 MED ORDER — IOHEXOL 300 MG/ML  SOLN
100.0000 mL | Freq: Once | INTRAMUSCULAR | Status: AC | PRN
  Administered 2014-10-04: 100 mL via INTRAVENOUS

## 2014-10-04 MED ORDER — HYDROMORPHONE HCL 1 MG/ML IJ SOLN
1.0000 mg | Freq: Once | INTRAMUSCULAR | Status: AC
Start: 2014-10-04 — End: 2014-10-04
  Administered 2014-10-04: 1 mg via INTRAVENOUS

## 2014-10-04 NOTE — ED Notes (Signed)
[facility] here to pick up patient for transport to [facility]- Emergency Dept, they left at this time with patient.

## 2014-10-04 NOTE — ED Notes (Signed)
CRITICAL VALUE ALERT  Critical value received:  Platelet 962,000  Date of notification:  10/04/2014  Time of notification:  0959  Critical value read back:Yes.    Nurse who received alert:  Iona Coach  MD notified (1st page):  Lacinda Axon  Time of first page:  1000  Responding MD:  Lacinda Axon  Time MD responded:  1000

## 2014-10-04 NOTE — ED Notes (Signed)
Facesheet faxed to [facility], per their charge nurse request.

## 2014-10-04 NOTE — ED Notes (Signed)
Called spoke with [facility] Emergency Dept charge nurse; Cleatis Polka, patient report given, all questions answered.

## 2014-10-04 NOTE — ED Notes (Signed)
[facility] called for transport 

## 2014-10-04 NOTE — ED Notes (Signed)
Patient complaining of abdominal pain x 2 weeks since having surgery. Patient states she had hernia surgery, hysterectomy, and part of her colon removed. States pain has stopped since surgery and has worsened. States she was treated here recently for same. States dilaudid pills are not helping pain.

## 2014-10-04 NOTE — ED Provider Notes (Signed)
CSN: [id number]     Arrival date & time 10/04/14  0815 History  This chart was scribed for Nat Christen, MD by Edison Simon, ED Scribe. This patient was seen in room APA01/APA01 and the patient's care was started at 8:34 AM.    Chief Complaint  Patient presents with  . Abdominal Pain   The history is provided by the patient. No language interpreter was used.    HPI Comments: Cassidy Barber is a 63 y.o. female who presents to the Emergency Department complaining of intermittent, "grabbing," aching abdominal pain with onset 3.5 weeks ago status post surgery for total abdominal hysterectomy, colon resection, and hernia repair at [facility] to treat colon cancer to which she is genetically presdisposed. She also had a liver transplant in January, after which she did well. She was treated here 10 days for the same and had a CT. She saw a Psychologist, sport and exercise at [facility] 1 week ago and has an appointment to return again in 1 week. She states she called [facility] 3 days ago and was told to come here and have another CT scan. She reports associated decreased food and fluid intake. She denies nausea, vomiting, or bowel changes. She has been using Dilaudid and a nausea medication.  Past Medical History  Diagnosis Date  . Liver disease   . Hernia of unspecified site of abdominal cavity without mention of obstruction or gangrene   . Thyroid disease   . Hypertension   . Primary biliary cirrhosis 10/26/2013  . Anemia of chronic disease 10/26/2013   Past Surgical History  Procedure Laterality Date  . Abdominal hernia repair      Patient's states that she has had 8- 9 hernia surgeries  . Splenectomy  2006  . Cholecystectomy  2007  . Colonoscopy      Done at [facility]  . Upper gastrointestinal endoscopy      Done at [facility]  . Colon surgery  2008    Done at [facility]  . Hernia repair    . Liver transplant  [id number]  . Colon resection    . Abdominal hysterectomy     Family History  Problem Relation Age of Onset  . Prostate cancer Father   .  Colon cancer Sister   . Healthy Son    History  Substance Use Topics  . Smoking status: Never Smoker   . Smokeless tobacco: Never Used  . Alcohol Use: No   OB History    No data available     Review of Systems A complete 10 system review of systems was obtained and all systems are negative except as noted in the HPI and PMH.    Allergies  Codeine and Oxycodone  Home Medications   Prior to Admission medications   Medication Sig Start Date End Date Taking? Authorizing Provider  acetaminophen (TYLENOL) 325 MG tablet Take 650 mg by mouth every 6 (six) hours as needed for mild pain or moderate pain.    Yes Historical Provider, MD  ALPRAZolam Duanne Moron) 0.5 MG tablet Take 0.5 mg by mouth at bedtime as needed for anxiety or sleep.   Yes Historical Provider, MD  aspirin 325 MG tablet Take 325 mg by mouth daily.   Yes Historical Provider, MD  ciprofloxacin (CIPRO) 500 MG tablet Take 1 tablet (500 mg total) by mouth 2 (two) times daily. 09/24/14  Yes Evalee Jefferson, PA-C  ferrous sulfate 325 (65 FE) MG tablet Take 325 mg by mouth 2 (two) times daily with a meal.  Yes Historical Provider, MD  furosemide (LASIX) 40 MG tablet Take 20-40 mg by mouth 2 (two) times daily. 40 mg in the morning and 20 mg in the evening.   Yes Historical Provider, MD  HYDROmorphone (DILAUDID) 2 MG tablet Take 1 tablet by mouth every 4 (four) hours as needed for severe pain.  09/27/14  Yes Historical Provider, MD  hydrOXYzine (VISTARIL) 25 MG capsule Take 25 mg by mouth 3 (three) times daily.   Yes Historical Provider, MD  levETIRAcetam (KEPPRA) 500 MG tablet Take 500 mg by mouth 2 (two) times daily.   Yes Historical Provider, MD  levothyroxine (SYNTHROID, LEVOTHROID) 150 MCG tablet Take 150 mcg by mouth daily.   Yes Historical Provider, MD  losartan (COZAAR) 25 MG tablet Take 25 mg by mouth daily.   Yes Historical Provider, MD  metoprolol succinate (TOPROL-XL) 25 MG 24 hr tablet Take 25 mg by mouth daily.   Yes Historical  Provider, MD  pantoprazole (PROTONIX) 40 MG tablet Take 40 mg by mouth daily.   Yes Historical Provider, MD  potassium chloride SA (K-DUR,KLOR-CON) 20 MEQ tablet Take 20 mEq by mouth daily.   Yes Historical Provider, MD  senna (SENOKOT) 8.6 MG TABS tablet Take 1 tablet by mouth daily as needed for mild constipation.   Yes Historical Provider, MD  sildenafil (REVATIO) 20 MG tablet Take 20 mg by mouth 3 (three) times daily.   Yes Historical Provider, MD  tacrolimus (PROGRAF) 1 MG capsule Take 2 mg by mouth 2 (two) times daily.   Yes Historical Provider, MD  ursodiol (ACTIGALL) 300 MG capsule Take 900 mg by mouth 2 (two) times daily. The patient states that she is taking 3 in the morning and 3 in the evening   Yes Historical Provider, MD  valGANciclovir (VALCYTE) 450 MG tablet Take 450 mg by mouth daily.    Yes Historical Provider, MD  zolpidem (AMBIEN) 5 MG tablet Take 5 mg by mouth at bedtime as needed for sleep.   Yes Historical Provider, MD  ALPRAZolam (XANAX) 0.25 MG tablet Take 1 tablet (0.25 mg total) by mouth 2 (two) times daily as needed for anxiety. Patient not taking: Reported on 09/24/2014 03/26/14   [name], FNP  HYDROcodone-acetaminophen (NORCO/VICODIN) 5-325 MG per tablet Take 1 tablet by mouth every 4 (four) hours as needed. Patient not taking: Reported on 10/04/2014 09/24/14   Evalee Jefferson, PA-C  oxyCODONE-acetaminophen (PERCOCET/ROXICET) 5-325 MG per tablet Take 1 tablet by mouth every 4 (four) hours as needed. Patient not taking: Reported on 10/04/2014 09/24/14   Evalee Jefferson, PA-C   BP 121/59 mmHg  Pulse 103  Temp(Src) 98.1 F (36.7 C) (Oral)  Resp 18  Ht 5\' 4"  (1.626 m)  Wt 135 lb (61.236 kg)  BMI 23.16 kg/m2  SpO2 90% Physical Exam  Constitutional: She is oriented to person, place, and time. She appears well-developed and well-nourished.  Looks dehydrated  HENT:  Head: Normocephalic and atraumatic.  Eyes: Conjunctivae and EOM are normal. Pupils are equal, round,  and reactive to light.  Neck: Normal range of motion. Neck supple.  Cardiovascular: Normal rate, regular rhythm and normal heart sounds.   Pulmonary/Chest: Effort normal and breath sounds normal.  Abdominal: Soft. Bowel sounds are normal. She exhibits no distension. There is tenderness (tender generally). There is no rebound and no guarding.  Vertical scar 25cm in length with stapled  Musculoskeletal: Normal range of motion.  Neurological: She is alert and oriented to person, place, and time.  Skin:  Skin is warm and dry.  Psychiatric: She has a normal mood and affect. Her behavior is normal.  Nursing note and vitals reviewed.   ED Course  Procedures (including critical care time)  DIAGNOSTIC STUDIES: Oxygen Saturation is 99% on room air, normal by my interpretation.    COORDINATION OF CARE: 8:44 AM Discussed treatment plan with patient at beside, including fluids, pain medication, and CT scan. The patient agrees with the plan and has no further questions at this time.   Labs Review Labs Reviewed  COMPREHENSIVE METABOLIC PANEL - Abnormal; Notable for the following:    Sodium 134 (*)    Chloride 93 (*)    Glucose, Bld 106 (*)    Albumin 2.3 (*)    AST 51 (*)    Alkaline Phosphatase 1026 (*)    Total Bilirubin 2.7 (*)    GFR calc non Af Amer 54 (*)    GFR calc Af Amer 63 (*)    All other components within normal limits  CBC WITH DIFFERENTIAL - Abnormal; Notable for the following:    WBC 11.7 (*)    Hemoglobin 10.2 (*)    HCT 32.9 (*)    MCH 25.8 (*)    RDW 17.9 (*)    Platelets 962 (*)    Monocytes Relative 13 (*)    Monocytes Absolute 1.5 (*)    Eosinophils Relative 9 (*)    Eosinophils Absolute 1.0 (*)    Basophils Relative 2 (*)    Basophils Absolute 0.3 (*)    All other components within normal limits  LIPASE, BLOOD - Abnormal; Notable for the following:    Lipase 7 (*)    All other components within normal limits  URINALYSIS, ROUTINE W REFLEX MICROSCOPIC -  Abnormal; Notable for the following:    Bilirubin Urine MODERATE (*)    All other components within normal limits  PATHOLOGIST SMEAR REVIEW    Imaging Review Ct Abdomen Pelvis W Contrast  10/04/2014   CLINICAL DATA:  Pain all over abdomen. History of cirrhosis and liver transplant. Hysterectomy 6 weeks ago.  EXAM: CT ABDOMEN AND PELVIS WITH CONTRAST  TECHNIQUE: Multidetector CT imaging of the abdomen and pelvis was performed using the standard protocol following bolus administration of intravenous contrast.  CONTRAST:  23mL OMNIPAQUE IOHEXOL 300 MG/ML SOLN, 142mL OMNIPAQUE IOHEXOL 300 MG/ML SOLN  COMPARISON:  09/24/2014  FINDINGS: Again noted is a small-to-moderate sized left pleural effusion with compressive atelectasis in left lower lobe. Tiny right pleural effusion.  Again noted are skin staples along the anterior abdomen. There continues to be a subcutaneous fluid collection anterior to the abdominal wall in the lower abdomen. This collection now contains less gas. Collection roughly measures 13.2 x 2.3 cm and previously measured 9.2 x 1.8 cm at a similar level.  Postsurgical changes compatible with a liver transplantation. There is residual fluid in the right upper abdomen adjacent to the liver which has decreased in size. Gallbladder and spleen are surgically absent. A large splenic remnant or splenule is stable. No acute abnormality involving the pancreas or kidneys. Probable small left renal cysts without hydronephrosis. Adrenal glands are difficult to evaluate. Mild intrahepatic biliary dilatation which is unchanged. Portal venous system is patent. Again noted are prominent venous structures in the upper abdomen.  Uterus is absent. Fluid in the pelvis has resolved. Urinary bladder is moderately distended. Anastomotic surgical clips in the distal colon. Distal colon contains a large amount of stool. Evidence for partial colectomy. There are dilated  loops of small bowel, particularly in the left  abdomen. Again noted is mild mesenteric edema. Decreased abdominal ascites. Grade 2 anterolisthesis at L5-S1 due to bilateral pars defects at L5. No acute bone abnormality.  IMPRESSION: Subcutaneous fluid collection in the anterior lower abdomen has mildly enlarged in size. There is decreased gas within this collection. Findings are most compatible with a postoperative fluid collection, such as a seroma.  There is a small amount of residual fluid around the liver and in the right upper quadrant. However, markedly decreased abdominal ascites compared to the previous examination. There continues to be some mesenteric edema.  Dilated loops of small bowel with post surgical changes. There is also a large amount of stool in the remaining colon. Dilated loops of small bowel could be related to constipation but cannot exclude a partial small bowel obstruction.  Residual pleural effusions, left side greater than right.  Postsurgical changes compatible with liver transplantation.   Electronically Signed   By: Markus Daft M.D.   On: 10/04/2014 10:55     EKG Interpretation None     CRITICAL CARE Performed by: Nat Christen Total critical care time: 30 Critical care time was exclusive of separately billable procedures and treating other patients. Critical care was necessary to treat or prevent imminent or life-threatening deterioration. Critical care was time spent personally by me on the following activities: development of treatment plan with patient and/or surrogate as well as nursing, discussions with consultants, evaluation of patient's response to treatment, examination of patient, obtaining history from patient or surrogate, ordering and performing treatments and interventions, ordering and review of laboratory studies, ordering and review of radiographic studies, pulse oximetry and re-evaluation of patient's condition. MDM   Final diagnoses:  Abdominal pain  Total bilirubin, elevated  Elevated alkaline  phosphatase level  H/O liver transplant     Status post liver transplant in January 2015.  Also status post colonic resection, TAH-BSO, and hernia repair approximately 3 1/2 weeks ago at the [facility] in [place]. Patient is now having persistent abdominal pain and is dehydrated. She is afebrile. However alkaline phosphatase and total bilirubin are elevated.  CT abdomen pelvis show no acute findings. Patient given IV fluids and pain management. Discussed with Dr. Rodena Medin and Dr Stann Mainland at the [facility] [place] in [place], [place]. Transfer patient.    Nat Christen, MD 10/04/14 [phone]

## 2014-10-05 DIAGNOSIS — R7989 Other specified abnormal findings of blood chemistry: Secondary | ICD-10-CM | POA: Diagnosis not present

## 2014-10-05 DIAGNOSIS — Z944 Liver transplant status: Secondary | ICD-10-CM | POA: Diagnosis not present

## 2014-10-05 DIAGNOSIS — R109 Unspecified abdominal pain: Secondary | ICD-10-CM | POA: Diagnosis not present

## 2014-10-05 DIAGNOSIS — K839 Disease of biliary tract, unspecified: Secondary | ICD-10-CM | POA: Diagnosis not present

## 2014-10-05 LAB — PATHOLOGIST SMEAR REVIEW

## 2014-10-06 DIAGNOSIS — Z944 Liver transplant status: Secondary | ICD-10-CM | POA: Diagnosis not present

## 2014-10-06 DIAGNOSIS — K831 Obstruction of bile duct: Secondary | ICD-10-CM | POA: Diagnosis not present

## 2014-10-07 DIAGNOSIS — R197 Diarrhea, unspecified: Secondary | ICD-10-CM | POA: Insufficient documentation

## 2014-10-08 DIAGNOSIS — Z792 Long term (current) use of antibiotics: Secondary | ICD-10-CM | POA: Diagnosis not present

## 2014-10-09 DIAGNOSIS — Z48816 Encounter for surgical aftercare following surgery on the genitourinary system: Secondary | ICD-10-CM | POA: Diagnosis not present

## 2014-10-09 DIAGNOSIS — Z5181 Encounter for therapeutic drug level monitoring: Secondary | ICD-10-CM | POA: Diagnosis not present

## 2014-10-09 DIAGNOSIS — Z944 Liver transplant status: Secondary | ICD-10-CM | POA: Diagnosis not present

## 2014-10-09 DIAGNOSIS — Z452 Encounter for adjustment and management of vascular access device: Secondary | ICD-10-CM | POA: Diagnosis not present

## 2014-10-09 DIAGNOSIS — N390 Urinary tract infection, site not specified: Secondary | ICD-10-CM | POA: Diagnosis not present

## 2014-10-09 DIAGNOSIS — K745 Biliary cirrhosis, unspecified: Secondary | ICD-10-CM | POA: Diagnosis not present

## 2014-10-09 DIAGNOSIS — Z48815 Encounter for surgical aftercare following surgery on the digestive system: Secondary | ICD-10-CM | POA: Diagnosis not present

## 2014-10-12 DIAGNOSIS — N390 Urinary tract infection, site not specified: Secondary | ICD-10-CM | POA: Diagnosis not present

## 2014-10-12 DIAGNOSIS — Z944 Liver transplant status: Secondary | ICD-10-CM | POA: Diagnosis not present

## 2014-10-12 DIAGNOSIS — Z452 Encounter for adjustment and management of vascular access device: Secondary | ICD-10-CM | POA: Diagnosis not present

## 2014-10-12 DIAGNOSIS — K745 Biliary cirrhosis, unspecified: Secondary | ICD-10-CM | POA: Diagnosis not present

## 2014-10-12 DIAGNOSIS — Z48816 Encounter for surgical aftercare following surgery on the genitourinary system: Secondary | ICD-10-CM | POA: Diagnosis not present

## 2014-10-12 DIAGNOSIS — Z48815 Encounter for surgical aftercare following surgery on the digestive system: Secondary | ICD-10-CM | POA: Diagnosis not present

## 2014-10-14 DIAGNOSIS — Z48815 Encounter for surgical aftercare following surgery on the digestive system: Secondary | ICD-10-CM | POA: Diagnosis not present

## 2014-10-14 DIAGNOSIS — Z452 Encounter for adjustment and management of vascular access device: Secondary | ICD-10-CM | POA: Diagnosis not present

## 2014-10-14 DIAGNOSIS — Z48816 Encounter for surgical aftercare following surgery on the genitourinary system: Secondary | ICD-10-CM | POA: Diagnosis not present

## 2014-10-14 DIAGNOSIS — Z944 Liver transplant status: Secondary | ICD-10-CM | POA: Diagnosis not present

## 2014-10-14 DIAGNOSIS — N390 Urinary tract infection, site not specified: Secondary | ICD-10-CM | POA: Diagnosis not present

## 2014-10-14 DIAGNOSIS — K745 Biliary cirrhosis, unspecified: Secondary | ICD-10-CM | POA: Diagnosis not present

## 2014-10-18 DIAGNOSIS — Z452 Encounter for adjustment and management of vascular access device: Secondary | ICD-10-CM | POA: Diagnosis not present

## 2014-10-18 DIAGNOSIS — N390 Urinary tract infection, site not specified: Secondary | ICD-10-CM | POA: Diagnosis not present

## 2014-10-18 DIAGNOSIS — Z48815 Encounter for surgical aftercare following surgery on the digestive system: Secondary | ICD-10-CM | POA: Diagnosis not present

## 2014-10-18 DIAGNOSIS — K745 Biliary cirrhosis, unspecified: Secondary | ICD-10-CM | POA: Diagnosis not present

## 2014-10-18 DIAGNOSIS — Z944 Liver transplant status: Secondary | ICD-10-CM | POA: Diagnosis not present

## 2014-10-18 DIAGNOSIS — Z48816 Encounter for surgical aftercare following surgery on the genitourinary system: Secondary | ICD-10-CM | POA: Diagnosis not present

## 2014-10-25 ENCOUNTER — Other Ambulatory Visit (INDEPENDENT_AMBULATORY_CARE_PROVIDER_SITE_OTHER): Payer: Medicare Other

## 2014-10-25 DIAGNOSIS — Z944 Liver transplant status: Secondary | ICD-10-CM | POA: Diagnosis not present

## 2014-10-25 NOTE — Progress Notes (Signed)
Transplant patient ordered by Hendricks Limes

## 2014-10-26 LAB — CBC WITH DIFFERENTIAL
Basophils Absolute: 0.3 10*3/uL — ABNORMAL HIGH (ref 0.0–0.2)
Basos: 3 %
EOS: 17 %
Eosinophils Absolute: 1.3 10*3/uL — ABNORMAL HIGH (ref 0.0–0.4)
HCT: 33 % — ABNORMAL LOW (ref 34.0–46.6)
Hemoglobin: 9.9 g/dL — ABNORMAL LOW (ref 11.1–15.9)
IMMATURE GRANULOCYTES: 0 %
Immature Grans (Abs): 0 10*3/uL (ref 0.0–0.1)
LYMPHS ABS: 2.9 10*3/uL (ref 0.7–3.1)
Lymphs: 40 %
MCH: 26.5 pg — ABNORMAL LOW (ref 26.6–33.0)
MCHC: 30 g/dL — AB (ref 31.5–35.7)
MCV: 88 fL (ref 79–97)
Monocytes Absolute: 0.8 10*3/uL (ref 0.1–0.9)
Monocytes: 11 %
Neutrophils Absolute: 2.2 10*3/uL (ref 1.4–7.0)
Neutrophils Relative %: 29 %
Platelets: 520 10*3/uL — ABNORMAL HIGH (ref 150–379)
RBC: 3.74 x10E6/uL — ABNORMAL LOW (ref 3.77–5.28)
RDW: 21.4 % — AB (ref 12.3–15.4)
WBC: 7.4 10*3/uL (ref 3.4–10.8)

## 2014-10-26 LAB — CMP14+EGFR
ALT: 18 IU/L (ref 0–32)
AST: 39 IU/L (ref 0–40)
Albumin/Globulin Ratio: 0.9 — ABNORMAL LOW (ref 1.1–2.5)
Albumin: 3.2 g/dL — ABNORMAL LOW (ref 3.6–4.8)
Alkaline Phosphatase: 315 IU/L — ABNORMAL HIGH (ref 39–117)
BILIRUBIN TOTAL: 0.4 mg/dL (ref 0.0–1.2)
BUN/Creatinine Ratio: 20 (ref 11–26)
BUN: 11 mg/dL (ref 8–27)
CO2: 21 mmol/L (ref 18–29)
Calcium: 8.8 mg/dL (ref 8.7–10.3)
Chloride: 105 mmol/L (ref 97–108)
Creatinine, Ser: 0.55 mg/dL — ABNORMAL LOW (ref 0.57–1.00)
GFR calc non Af Amer: 100 mL/min/{1.73_m2} (ref >59)
GFR, EST AFRICAN AMERICAN: 115 mL/min/{1.73_m2} (ref >59)
GLUCOSE: 86 mg/dL (ref 65–99)
Globulin, Total: 3.4 g/dL (ref 1.5–4.5)
POTASSIUM: 5.2 mmol/L (ref 3.5–5.2)
Sodium: 139 mmol/L (ref 134–144)
TOTAL PROTEIN: 6.6 g/dL (ref 6.0–8.5)

## 2014-10-26 LAB — TACROLIMUS, BLOOD: TACROLIMUS LVL: 4.5 ng/mL (ref 2.0–20.0)

## 2014-10-26 LAB — MAGNESIUM: Magnesium: 1.9 mg/dL (ref 1.6–2.3)

## 2014-10-26 LAB — EVEROLIMUS: EVEROLIMUS: 5.3 ng/mL (ref 3.0–8.0)

## 2014-10-26 LAB — PHOSPHORUS: Phosphorus: 4.5 mg/dL (ref 2.5–4.5)

## 2014-11-01 ENCOUNTER — Other Ambulatory Visit (INDEPENDENT_AMBULATORY_CARE_PROVIDER_SITE_OTHER): Payer: Medicare Other

## 2014-11-01 DIAGNOSIS — Z944 Liver transplant status: Secondary | ICD-10-CM | POA: Diagnosis not present

## 2014-11-01 NOTE — Progress Notes (Signed)
Lab only for dr Hendricks Limes

## 2014-11-03 LAB — CMP14+EGFR
A/G RATIO: 1 — AB (ref 1.1–2.5)
ALT: 34 IU/L — ABNORMAL HIGH (ref 0–32)
AST: 46 IU/L — AB (ref 0–40)
Albumin: 3.3 g/dL — ABNORMAL LOW (ref 3.6–4.8)
Alkaline Phosphatase: 284 IU/L — ABNORMAL HIGH (ref 39–117)
BUN/Creatinine Ratio: 25 (ref 11–26)
BUN: 15 mg/dL (ref 8–27)
CALCIUM: 8.9 mg/dL (ref 8.7–10.3)
CO2: 21 mmol/L (ref 18–29)
CREATININE: 0.6 mg/dL (ref 0.57–1.00)
Chloride: 106 mmol/L (ref 97–108)
GFR calc Af Amer: 112 mL/min/{1.73_m2} (ref >59)
GFR, EST NON AFRICAN AMERICAN: 97 mL/min/{1.73_m2} (ref >59)
GLUCOSE: 94 mg/dL (ref 65–99)
Globulin, Total: 3.3 g/dL (ref 1.5–4.5)
Potassium: 5.3 mmol/L — ABNORMAL HIGH (ref 3.5–5.2)
SODIUM: 139 mmol/L (ref 134–144)
TOTAL PROTEIN: 6.6 g/dL (ref 6.0–8.5)
Total Bilirubin: 0.3 mg/dL (ref 0.0–1.2)

## 2014-11-03 LAB — CBC WITH DIFFERENTIAL
Basophils Absolute: 0.2 10*3/uL (ref 0.0–0.2)
Basos: 2 %
EOS: 14 %
Eosinophils Absolute: 1.1 10*3/uL — ABNORMAL HIGH (ref 0.0–0.4)
HEMATOCRIT: 34.2 % (ref 34.0–46.6)
Hemoglobin: 10.2 g/dL — ABNORMAL LOW (ref 11.1–15.9)
IMMATURE GRANULOCYTES: 0 %
Immature Grans (Abs): 0 10*3/uL (ref 0.0–0.1)
LYMPHS: 40 %
Lymphocytes Absolute: 3.2 10*3/uL — ABNORMAL HIGH (ref 0.7–3.1)
MCH: 26.2 pg — ABNORMAL LOW (ref 26.6–33.0)
MCHC: 29.8 g/dL — ABNORMAL LOW (ref 31.5–35.7)
MCV: 88 fL (ref 79–97)
MONOCYTES: 11 %
Monocytes Absolute: 0.9 10*3/uL (ref 0.1–0.9)
Neutrophils Absolute: 2.7 10*3/uL (ref 1.4–7.0)
Neutrophils Relative %: 33 %
Platelets: 516 10*3/uL — ABNORMAL HIGH (ref 150–379)
RBC: 3.9 x10E6/uL (ref 3.77–5.28)
RDW: 20.7 % — AB (ref 12.3–15.4)
WBC: 8 10*3/uL (ref 3.4–10.8)

## 2014-11-03 LAB — PHOSPHORUS: PHOSPHORUS: 5.1 mg/dL — AB (ref 2.5–4.5)

## 2014-11-03 LAB — TACROLIMUS, BLOOD: TACROLIMUS LVL: 3.7 ng/mL (ref 2.0–20.0)

## 2014-11-03 LAB — MAGNESIUM: Magnesium: 1.8 mg/dL (ref 1.6–2.3)

## 2014-11-03 LAB — EVEROLIMUS: EVEROLIMUS: 3.8 ng/mL (ref 3.0–8.0)

## 2014-11-08 ENCOUNTER — Other Ambulatory Visit (INDEPENDENT_AMBULATORY_CARE_PROVIDER_SITE_OTHER): Payer: Medicare Other

## 2014-11-08 DIAGNOSIS — Z944 Liver transplant status: Secondary | ICD-10-CM

## 2014-11-08 NOTE — Progress Notes (Signed)
Lab only ordered by dr. Hendricks Limes transplant doctor

## 2014-11-09 LAB — CBC WITH DIFFERENTIAL
BASOS ABS: 0.2 10*3/uL (ref 0.0–0.2)
BASOS: 3 %
EOS ABS: 0.7 10*3/uL — AB (ref 0.0–0.4)
Eos: 10 %
HEMATOCRIT: 33.9 % — AB (ref 34.0–46.6)
HEMOGLOBIN: 10.1 g/dL — AB (ref 11.1–15.9)
Immature Grans (Abs): 0 10*3/uL (ref 0.0–0.1)
Immature Granulocytes: 0 %
LYMPHS ABS: 3.5 10*3/uL — AB (ref 0.7–3.1)
LYMPHS: 49 %
MCH: 26 pg — ABNORMAL LOW (ref 26.6–33.0)
MCHC: 29.8 g/dL — AB (ref 31.5–35.7)
MCV: 87 fL (ref 79–97)
MONOCYTES: 14 %
Monocytes Absolute: 1 10*3/uL — ABNORMAL HIGH (ref 0.1–0.9)
NEUTROS ABS: 1.7 10*3/uL (ref 1.4–7.0)
Neutrophils Relative %: 24 %
Platelets: 392 10*3/uL — ABNORMAL HIGH (ref 150–379)
RBC: 3.89 x10E6/uL (ref 3.77–5.28)
RDW: 20.2 % — ABNORMAL HIGH (ref 12.3–15.4)
WBC: 7.2 10*3/uL (ref 3.4–10.8)

## 2014-11-09 LAB — CMP14+EGFR
ALT: 59 IU/L — AB (ref 0–32)
AST: 71 IU/L — ABNORMAL HIGH (ref 0–40)
Albumin/Globulin Ratio: 1.1 (ref 1.1–2.5)
Albumin: 3.5 g/dL — ABNORMAL LOW (ref 3.6–4.8)
Alkaline Phosphatase: 380 IU/L — ABNORMAL HIGH (ref 39–117)
BUN/Creatinine Ratio: 17 (ref 11–26)
BUN: 9 mg/dL (ref 8–27)
CALCIUM: 8.9 mg/dL (ref 8.7–10.3)
CHLORIDE: 106 mmol/L (ref 97–108)
CO2: 21 mmol/L (ref 18–29)
Creatinine, Ser: 0.53 mg/dL — ABNORMAL LOW (ref 0.57–1.00)
GFR calc non Af Amer: 101 mL/min/{1.73_m2} (ref >59)
GFR, EST AFRICAN AMERICAN: 117 mL/min/{1.73_m2} (ref >59)
GLOBULIN, TOTAL: 3.1 g/dL (ref 1.5–4.5)
GLUCOSE: 89 mg/dL (ref 65–99)
Potassium: 4.9 mmol/L (ref 3.5–5.2)
Sodium: 139 mmol/L (ref 134–144)
Total Bilirubin: 0.3 mg/dL (ref 0.0–1.2)
Total Protein: 6.6 g/dL (ref 6.0–8.5)

## 2014-11-09 LAB — PHOSPHORUS: Phosphorus: 4.3 mg/dL (ref 2.5–4.5)

## 2014-11-09 LAB — TACROLIMUS, BLOOD: TACROLIMUS LVL: 3.7 ng/mL (ref 2.0–20.0)

## 2014-11-09 LAB — MAGNESIUM: MAGNESIUM: 1.7 mg/dL (ref 1.6–2.3)

## 2014-11-09 LAB — EVEROLIMUS: Everolimus: 2 ng/mL — ABNORMAL LOW (ref 3.0–8.0)

## 2014-11-11 ENCOUNTER — Ambulatory Visit (INDEPENDENT_AMBULATORY_CARE_PROVIDER_SITE_OTHER): Payer: Medicare Other | Admitting: Family Medicine

## 2014-11-11 DIAGNOSIS — N390 Urinary tract infection, site not specified: Secondary | ICD-10-CM | POA: Diagnosis not present

## 2014-11-11 DIAGNOSIS — Z5181 Encounter for therapeutic drug level monitoring: Secondary | ICD-10-CM | POA: Diagnosis not present

## 2014-11-11 DIAGNOSIS — K745 Biliary cirrhosis, unspecified: Secondary | ICD-10-CM

## 2014-11-11 DIAGNOSIS — Z452 Encounter for adjustment and management of vascular access device: Secondary | ICD-10-CM | POA: Diagnosis not present

## 2014-11-11 DIAGNOSIS — Z48815 Encounter for surgical aftercare following surgery on the digestive system: Secondary | ICD-10-CM

## 2014-11-11 DIAGNOSIS — Z48816 Encounter for surgical aftercare following surgery on the genitourinary system: Secondary | ICD-10-CM

## 2014-11-11 DIAGNOSIS — Z944 Liver transplant status: Secondary | ICD-10-CM | POA: Diagnosis not present

## 2014-11-19 DIAGNOSIS — C189 Malignant neoplasm of colon, unspecified: Secondary | ICD-10-CM | POA: Diagnosis not present

## 2014-11-19 DIAGNOSIS — K831 Obstruction of bile duct: Secondary | ICD-10-CM | POA: Diagnosis not present

## 2014-11-19 DIAGNOSIS — Z09 Encounter for follow-up examination after completed treatment for conditions other than malignant neoplasm: Secondary | ICD-10-CM | POA: Diagnosis not present

## 2014-11-19 DIAGNOSIS — T8585XA Stenosis due to internal prosthetic devices, implants and grafts, not elsewhere classified, initial encounter: Secondary | ICD-10-CM | POA: Diagnosis not present

## 2014-11-19 DIAGNOSIS — K9189 Other postprocedural complications and disorders of digestive system: Secondary | ICD-10-CM | POA: Diagnosis not present

## 2014-11-19 DIAGNOSIS — Z800 Family history of malignant neoplasm of digestive organs: Secondary | ICD-10-CM | POA: Diagnosis not present

## 2014-11-19 DIAGNOSIS — Z944 Liver transplant status: Secondary | ICD-10-CM | POA: Diagnosis not present

## 2014-11-19 DIAGNOSIS — Z4659 Encounter for fitting and adjustment of other gastrointestinal appliance and device: Secondary | ICD-10-CM | POA: Diagnosis not present

## 2014-11-22 ENCOUNTER — Other Ambulatory Visit (INDEPENDENT_AMBULATORY_CARE_PROVIDER_SITE_OTHER): Payer: Medicare Other

## 2014-11-22 DIAGNOSIS — R52 Pain, unspecified: Secondary | ICD-10-CM

## 2014-11-22 DIAGNOSIS — Z944 Liver transplant status: Secondary | ICD-10-CM

## 2014-11-22 DIAGNOSIS — K743 Primary biliary cirrhosis: Secondary | ICD-10-CM | POA: Diagnosis not present

## 2014-11-22 NOTE — Progress Notes (Signed)
Transplant patient for Dr Ermalene [facility]

## 2014-11-24 LAB — CMP14+EGFR
ALK PHOS: 272 IU/L — AB (ref 39–117)
ALT: 34 IU/L — ABNORMAL HIGH (ref 0–32)
AST: 45 IU/L — ABNORMAL HIGH (ref 0–40)
Albumin/Globulin Ratio: 1.1 (ref 1.1–2.5)
Albumin: 3.4 g/dL — ABNORMAL LOW (ref 3.6–4.8)
BUN/Creatinine Ratio: 26 (ref 11–26)
BUN: 15 mg/dL (ref 8–27)
CHLORIDE: 105 mmol/L (ref 97–108)
CO2: 21 mmol/L (ref 18–29)
Calcium: 8.6 mg/dL — ABNORMAL LOW (ref 8.7–10.3)
Creatinine, Ser: 0.57 mg/dL (ref 0.57–1.00)
GFR calc Af Amer: 114 mL/min/{1.73_m2} (ref >59)
GFR calc non Af Amer: 99 mL/min/{1.73_m2} (ref >59)
GLUCOSE: 90 mg/dL (ref 65–99)
Globulin, Total: 3.1 g/dL (ref 1.5–4.5)
POTASSIUM: 4.2 mmol/L (ref 3.5–5.2)
SODIUM: 140 mmol/L (ref 134–144)
TOTAL PROTEIN: 6.5 g/dL (ref 6.0–8.5)

## 2014-11-24 LAB — CBC WITH DIFFERENTIAL
BASOS: 3 %
Basophils Absolute: 0.2 10*3/uL (ref 0.0–0.2)
EOS ABS: 0.8 10*3/uL — AB (ref 0.0–0.4)
Eos: 11 %
HCT: 31 % — ABNORMAL LOW (ref 34.0–46.6)
Hemoglobin: 9.8 g/dL — ABNORMAL LOW (ref 11.1–15.9)
Immature Grans (Abs): 0 10*3/uL (ref 0.0–0.1)
Immature Granulocytes: 0 %
Lymphocytes Absolute: 3.7 10*3/uL — ABNORMAL HIGH (ref 0.7–3.1)
Lymphs: 51 %
MCH: 26.9 pg (ref 26.6–33.0)
MCHC: 31.6 g/dL (ref 31.5–35.7)
MCV: 85 fL (ref 79–97)
MONOS ABS: 1 10*3/uL — AB (ref 0.1–0.9)
Monocytes: 14 %
Neutrophils Absolute: 1.5 10*3/uL (ref 1.4–7.0)
Neutrophils Relative %: 21 %
Platelets: 378 10*3/uL (ref 150–379)
RBC: 3.64 x10E6/uL — ABNORMAL LOW (ref 3.77–5.28)
RDW: 18.6 % — AB (ref 12.3–15.4)
WBC: 7.3 10*3/uL (ref 3.4–10.8)

## 2014-11-24 LAB — MAGNESIUM: Magnesium: 1.8 mg/dL (ref 1.6–2.3)

## 2014-11-24 LAB — PHOSPHORUS: PHOSPHORUS: 4.3 mg/dL (ref 2.5–4.5)

## 2014-11-24 LAB — EVEROLIMUS: EVEROLIMUS: 5 ng/mL (ref 3.0–8.0)

## 2014-11-24 LAB — TACROLIMUS, BLOOD: Tacrolimus Lvl: 3.7 ng/mL (ref 2.0–20.0)

## 2014-11-29 ENCOUNTER — Other Ambulatory Visit: Payer: Medicare Other

## 2014-11-29 DIAGNOSIS — Z944 Liver transplant status: Secondary | ICD-10-CM | POA: Diagnosis not present

## 2014-11-29 DIAGNOSIS — R5383 Other fatigue: Secondary | ICD-10-CM | POA: Diagnosis not present

## 2014-11-29 DIAGNOSIS — K743 Primary biliary cirrhosis: Secondary | ICD-10-CM

## 2014-11-30 LAB — CMP14+EGFR
ALT: 36 IU/L — AB (ref 0–32)
AST: 41 IU/L — AB (ref 0–40)
Albumin/Globulin Ratio: 1.1 (ref 1.1–2.5)
Albumin: 3.5 g/dL — ABNORMAL LOW (ref 3.6–4.8)
Alkaline Phosphatase: 249 IU/L — ABNORMAL HIGH (ref 39–117)
BUN/Creatinine Ratio: 17 (ref 11–26)
BUN: 11 mg/dL (ref 8–27)
CO2: 22 mmol/L (ref 18–29)
Calcium: 8.8 mg/dL (ref 8.7–10.3)
Chloride: 104 mmol/L (ref 97–108)
Creatinine, Ser: 0.64 mg/dL (ref 0.57–1.00)
GFR calc non Af Amer: 95 mL/min/{1.73_m2} (ref >59)
GFR, EST AFRICAN AMERICAN: 110 mL/min/{1.73_m2} (ref >59)
Globulin, Total: 3.1 g/dL (ref 1.5–4.5)
Glucose: 91 mg/dL (ref 65–99)
Potassium: 4.6 mmol/L (ref 3.5–5.2)
SODIUM: 138 mmol/L (ref 134–144)
TOTAL PROTEIN: 6.6 g/dL (ref 6.0–8.5)

## 2014-11-30 LAB — MAGNESIUM: Magnesium: 1.8 mg/dL (ref 1.6–2.3)

## 2014-11-30 LAB — CBC WITH DIFFERENTIAL
BASOS ABS: 0.2 10*3/uL (ref 0.0–0.2)
BASOS: 3 %
EOS ABS: 0.5 10*3/uL — AB (ref 0.0–0.4)
EOS: 7 %
HCT: 32.4 % — ABNORMAL LOW (ref 34.0–46.6)
HEMOGLOBIN: 10.1 g/dL — AB (ref 11.1–15.9)
IMMATURE GRANULOCYTES: 0 %
Immature Grans (Abs): 0 10*3/uL (ref 0.0–0.1)
Lymphocytes Absolute: 2.8 10*3/uL (ref 0.7–3.1)
Lymphs: 40 %
MCH: 26.2 pg — ABNORMAL LOW (ref 26.6–33.0)
MCHC: 31.2 g/dL — ABNORMAL LOW (ref 31.5–35.7)
MCV: 84 fL (ref 79–97)
Monocytes Absolute: 1.2 10*3/uL — ABNORMAL HIGH (ref 0.1–0.9)
Monocytes: 17 %
NEUTROS ABS: 2.3 10*3/uL (ref 1.4–7.0)
Neutrophils Relative %: 33 %
PLATELETS: 445 10*3/uL — AB (ref 150–379)
RBC: 3.86 x10E6/uL (ref 3.77–5.28)
RDW: 18.8 % — ABNORMAL HIGH (ref 12.3–15.4)
WBC: 6.9 10*3/uL (ref 3.4–10.8)

## 2014-11-30 LAB — PHOSPHORUS: Phosphorus: 4.8 mg/dL — ABNORMAL HIGH (ref 2.5–4.5)

## 2014-12-08 ENCOUNTER — Other Ambulatory Visit: Payer: Medicare Other

## 2014-12-08 ENCOUNTER — Encounter: Payer: Self-pay | Admitting: Family Medicine

## 2014-12-08 ENCOUNTER — Ambulatory Visit (INDEPENDENT_AMBULATORY_CARE_PROVIDER_SITE_OTHER): Payer: Medicare Other | Admitting: Family Medicine

## 2014-12-08 VITALS — BP 111/75 | HR 74 | Temp 97.1°F | Ht 63.0 in | Wt 133.2 lb

## 2014-12-08 DIAGNOSIS — E038 Other specified hypothyroidism: Secondary | ICD-10-CM

## 2014-12-08 DIAGNOSIS — C189 Malignant neoplasm of colon, unspecified: Secondary | ICD-10-CM

## 2014-12-08 DIAGNOSIS — R1010 Upper abdominal pain, unspecified: Secondary | ICD-10-CM | POA: Diagnosis not present

## 2014-12-08 DIAGNOSIS — Z944 Liver transplant status: Secondary | ICD-10-CM | POA: Diagnosis not present

## 2014-12-08 DIAGNOSIS — E034 Atrophy of thyroid (acquired): Secondary | ICD-10-CM

## 2014-12-08 DIAGNOSIS — R1011 Right upper quadrant pain: Secondary | ICD-10-CM | POA: Diagnosis not present

## 2014-12-08 DIAGNOSIS — R197 Diarrhea, unspecified: Secondary | ICD-10-CM | POA: Diagnosis not present

## 2014-12-08 DIAGNOSIS — G8929 Other chronic pain: Secondary | ICD-10-CM | POA: Diagnosis not present

## 2014-12-08 DIAGNOSIS — E0789 Other specified disorders of thyroid: Secondary | ICD-10-CM | POA: Diagnosis not present

## 2014-12-08 NOTE — Patient Instructions (Signed)
Hypothyroidism The thyroid is a large gland located in the lower front of your neck. The thyroid gland helps control metabolism. Metabolism is how your body handles food. It controls metabolism with the hormone thyroxine. When this gland is underactive (hypothyroid), it produces too little hormone.  CAUSES These include:   Absence or destruction of thyroid tissue.  Goiter due to iodine deficiency.  Goiter due to medications.  Congenital defects (since birth).  Problems with the pituitary. This causes a lack of TSH (thyroid stimulating hormone). This hormone tells the thyroid to turn out more hormone. SYMPTOMS  Lethargy (feeling as though you have no energy)  Cold intolerance  Weight gain (in spite of normal food intake)  Dry skin  Coarse hair  Menstrual irregularity (if severe, may lead to infertility)  Slowing of thought processes Cardiac problems are also caused by insufficient amounts of thyroid hormone. Hypothyroidism in the newborn is cretinism, and is an extreme form. It is important that this form be treated adequately and immediately or it will lead rapidly to retarded physical and mental development. DIAGNOSIS  To prove hypothyroidism, your caregiver may do blood tests and ultrasound tests. Sometimes the signs are hidden. It may be necessary for your caregiver to watch this illness with blood tests either before or after diagnosis and treatment. TREATMENT  Low levels of thyroid hormone are increased by using synthetic thyroid hormone. This is a safe, effective treatment. It usually takes about four weeks to gain the full effects of the medication. After you have the full effect of the medication, it will generally take another four weeks for problems to leave. Your caregiver may start you on low doses. If you have had heart problems the dose may be gradually increased. It is generally not an emergency to get rapidly to normal. HOME CARE INSTRUCTIONS   Take your  medications as your caregiver suggests. Let your caregiver know of any medications you are taking or start taking. Your caregiver will help you with dosage schedules.  As your condition improves, your dosage needs may increase. It will be necessary to have continuing blood tests as suggested by your caregiver.  Report all suspected medication side effects to your caregiver. SEEK MEDICAL CARE IF: Seek medical care if you develop:  Sweating.  Tremulousness (tremors).  Anxiety.  Rapid weight loss.  Heat intolerance.  Emotional swings.  Diarrhea.  Weakness. SEEK IMMEDIATE MEDICAL CARE IF:  You develop chest pain, an irregular heart beat (palpitations), or a rapid heart beat. MAKE SURE YOU:   Understand these instructions.  Will watch your condition.  Will get help right away if you are not doing well or get worse. Document Released: 10/29/2005 Document Revised: 01/21/2012 Document Reviewed: 06/18/2008 ExitCare Patient Information 2015 [employer]. This information is not intended to replace advice given to you by your health care provider. Make sure you discuss any questions you have with your health care provider.  

## 2014-12-08 NOTE — Addendum Note (Signed)
Addended by: Selmer Dominion on: 12/08/2014 01:29 PM   Modules accepted: Orders

## 2014-12-08 NOTE — Progress Notes (Signed)
Subjective:    Patient ID: Cassidy Barber, female    DOB: [birth year], 64 y.o.   MRN: [id number]  HPI Patient is here today to establish care.  Allergies  Allergen Reactions  . Codeine Nausea Only  . Oxycodone Nausea Only    Outpatient Encounter Prescriptions as of 12/08/2014  Medication Sig  . acetaminophen (TYLENOL) 325 MG tablet Take 650 mg by mouth every 6 (six) hours as needed for mild pain or moderate pain.   [name] ALPRAZolam (XANAX) 0.5 MG tablet Take 0.5 mg by mouth at bedtime as needed for anxiety or sleep.  [name] aspirin 325 MG tablet Take 325 mg by mouth daily.  [name] EVEROLIMUS PO Take 5 mg by mouth daily.  Take 3 mg in the AM and 2 mg in the PM  . ferrous sulfate 325 (65 FE) MG tablet Take 325 mg by mouth 2 (two) times daily with a meal.  . HYDROcodone-acetaminophen (NORCO/VICODIN) 5-325 MG per tablet Take 1 tablet by mouth every 4 (four) hours as needed.  . hydrOXYzine (VISTARIL) 25 MG capsule Take 25 mg by mouth 3 (three) times daily.  [name] levETIRAcetam (KEPPRA) 500 MG tablet Take 500 mg by mouth 2 (two) times daily.  [name] levothyroxine (SYNTHROID, LEVOTHROID) 150 MCG tablet Take 150 mcg by mouth daily.  [name] loperamide (IMODIUM) 2 MG capsule Take 1 capsule by mouth 3 times daily as needed for diarrhea (please stop after one dose if diarrhea stops to avoid constipation).  [name] losartan (COZAAR) 25 MG tablet Take 25 mg by mouth daily.  . magnesium oxide (MAG-OX) 400 MG tablet Take 1 tablet by mouth daily.  . metoprolol succinate (TOPROL-XL) 25 MG 24 hr tablet Take 25 mg by mouth daily.  . pantoprazole (PROTONIX) 40 MG tablet Take 40 mg by mouth daily.  . potassium chloride SA (K-DUR,KLOR-CON) 20 MEQ tablet Take 20 mEq by mouth daily.  [name] senna (SENOKOT) 8.6 MG TABS tablet Take 1 tablet by mouth daily as needed for mild constipation.  . tacrolimus (PROGRAF) 1 MG capsule Take 2 mg by mouth 2 (two) times daily.  . ursodiol (ACTIGALL) 300 MG capsule Take 900 mg by mouth 2 (two) times daily. The  patient states that she is taking 3 in the morning and 3 in the evening  . zolpidem (AMBIEN) 5 MG tablet Take 5 mg by mouth at bedtime as needed for sleep.  [name] GAVILYTE-N WITH FLAVOR PACK 420 G solution   . [DISCONTINUED] ALPRAZolam (XANAX) 0.25 MG tablet Take 1 tablet (0.25 mg total) by mouth 2 (two) times daily as needed for anxiety. (Patient not taking: Reported on 09/24/2014)  . [DISCONTINUED] ciprofloxacin (CIPRO) 500 MG tablet Take 1 tablet (500 mg total) by mouth 2 (two) times daily. (Patient not taking: Reported on 12/08/2014)  . [DISCONTINUED] furosemide (LASIX) 40 MG tablet Take 20-40 mg by mouth 2 (two) times daily. 40 mg in the morning and 20 mg in the evening.  . [DISCONTINUED] HYDROmorphone (DILAUDID) 2 MG tablet Take 1 tablet by mouth every 4 (four) hours as needed for severe pain.   . [DISCONTINUED] oxyCODONE-acetaminophen (PERCOCET/ROXICET) 5-325 MG per tablet Take 1 tablet by mouth every 4 (four) hours as needed. (Patient not taking: Reported on 10/04/2014)  . [DISCONTINUED] sildenafil (REVATIO) 20 MG tablet Take 20 mg by mouth 3 (three) times daily.  . [DISCONTINUED] valGANciclovir (VALCYTE) 450 MG tablet Take 450 mg by mouth daily.     Past Medical History  Diagnosis Date  . Liver disease   .  Hernia of unspecified site of abdominal cavity without mention of obstruction or gangrene   . Thyroid disease   . Hypertension   . Primary biliary cirrhosis 10/26/2013  . Anemia of chronic disease 10/26/2013    Past Surgical History  Procedure Laterality Date  . Abdominal hernia repair      Patient's states that she has had 8- 9 hernia surgeries  . Splenectomy  2006  . Cholecystectomy  2007  . Colonoscopy      Done at [facility]  . Upper gastrointestinal endoscopy      Done at [facility]  . Colon surgery  2008    Done at [facility]  . Hernia repair    . Liver transplant  [id number]  . Colon resection    . Abdominal hysterectomy      History   Social History  . Marital Status: Married     Spouse Name: N/A    Number of Children: N/A  . Years of Education: N/A   Occupational History  . Not on file.   Social History Main Topics  . Smoking status: Never Smoker   . Smokeless tobacco: Never Used  . Alcohol Use: No  . Drug Use: No  . Sexual Activity: Yes    Birth Control/ Protection: None   Other Topics Concern  . Not on file   Social History Narrative   Review of Systems     Objective:   Physical Exam BP 111/75 mmHg  Pulse 74  Temp(Src) 97.1 F (36.2 C) (Oral)  Ht 5\' 3"  (1.6 m)  Wt 133 lb 3.2 oz (60.419 kg)  BMI 23.60 kg/m2        Assessment & Plan:

## 2014-12-08 NOTE — Progress Notes (Signed)
Subjective:     Cassidy Barber is a 64 y.o. female who presents for follow up of hypothyroidism. Current symptoms: diarrhea. Patient denies heat / cold intolerance. Symptoms have been well-controlled.Patient presents for follow-up on  thyroid. She has a history of hypothyroidism for many years. It has been stable recently. Pt. denies any change in  voice, loss of hair, heat or cold intolerance. Energy level has been poor, however, this is likely related to her liver transplant and subsequent hernia surgery and colectomy. In November 2015 she was having significant right upper quadrant pain and found to have a stenotic bile duct. A stent was placed through ERCP. Subsequently she was found to have recurrent adenocarcinoma of the colon with a polyp being found in the transverse colon. Colectomy was performed. She was left with 12 inches of colon after her near-complete colectomy in November 2015.[name] She denies constipation. No myxedema. Medication is as noted below. Verified that pt is taking it daily on an empty stomach. Well tolerated. She continues to take medication for the liver transplant. She is followed by [facility]. Her nurse coordinator is Savannah belt. Phone number [phone]. Her transplant surgeon is Dr. Luther Hearing. Her transplant just is Dr. Durward Parcel. They can be reached at [phone]  Allergies  Allergen Reactions  . Codeine Nausea Only  . Oxycodone Nausea Only    Outpatient Encounter Prescriptions as of 12/08/2014  Medication Sig  . acetaminophen (TYLENOL) 325 MG tablet Take 650 mg by mouth every 6 (six) hours as needed for mild pain or moderate pain.   [name] ALPRAZolam (XANAX) 0.5 MG tablet Take 0.5 mg by mouth at bedtime as needed for anxiety or sleep.  [name] aspirin 325 MG tablet Take 325 mg by mouth daily.  [name] EVEROLIMUS PO Take 5 mg by mouth daily.  Take 3 mg in the AM and 2 mg in the PM  . ferrous sulfate 325 (65 FE) MG tablet Take 325 mg by mouth 2 (two) times daily with a  meal.  . HYDROcodone-acetaminophen (NORCO/VICODIN) 5-325 MG per tablet Take 1 tablet by mouth every 4 (four) hours as needed.  . hydrOXYzine (VISTARIL) 25 MG capsule Take 25 mg by mouth 3 (three) times daily.  [name] levETIRAcetam (KEPPRA) 500 MG tablet Take 500 mg by mouth 2 (two) times daily.  [name] levothyroxine (SYNTHROID, LEVOTHROID) 150 MCG tablet Take 150 mcg by mouth daily.  [name] loperamide (IMODIUM) 2 MG capsule Take 1 capsule by mouth 3 times daily as needed for diarrhea (please stop after one dose if diarrhea stops to avoid constipation).  [name] losartan (COZAAR) 25 MG tablet Take 25 mg by mouth daily.  . magnesium oxide (MAG-OX) 400 MG tablet Take 1 tablet by mouth daily.  . metoprolol succinate (TOPROL-XL) 25 MG 24 hr tablet Take 25 mg by mouth daily.  . pantoprazole (PROTONIX) 40 MG tablet Take 40 mg by mouth daily.  . potassium chloride SA (K-DUR,KLOR-CON) 20 MEQ tablet Take 20 mEq by mouth daily.  [name] senna (SENOKOT) 8.6 MG TABS tablet Take 1 tablet by mouth daily as needed for mild constipation.  . tacrolimus (PROGRAF) 1 MG capsule Take 2 mg by mouth 2 (two) times daily.  . ursodiol (ACTIGALL) 300 MG capsule Take 900 mg by mouth 2 (two) times daily. The patient states that she is taking 3 in the morning and 3 in the evening  . zolpidem (AMBIEN) 5 MG tablet Take 5 mg by mouth at bedtime as needed for sleep.  [name] GAVILYTE-N  WITH FLAVOR PACK 420 G solution   . [DISCONTINUED] ALPRAZolam (XANAX) 0.25 MG tablet Take 1 tablet (0.25 mg total) by mouth 2 (two) times daily as needed for anxiety. (Patient not taking: Reported on 09/24/2014)  . [DISCONTINUED] ciprofloxacin (CIPRO) 500 MG tablet Take 1 tablet (500 mg total) by mouth 2 (two) times daily. (Patient not taking: Reported on 12/08/2014)  . [DISCONTINUED] furosemide (LASIX) 40 MG tablet Take 20-40 mg by mouth 2 (two) times daily. 40 mg in the morning and 20 mg in the evening.  . [DISCONTINUED] HYDROmorphone (DILAUDID) 2 MG tablet Take 1 tablet by mouth  every 4 (four) hours as needed for severe pain.   . [DISCONTINUED] oxyCODONE-acetaminophen (PERCOCET/ROXICET) 5-325 MG per tablet Take 1 tablet by mouth every 4 (four) hours as needed. (Patient not taking: Reported on 10/04/2014)  . [DISCONTINUED] sildenafil (REVATIO) 20 MG tablet Take 20 mg by mouth 3 (three) times daily.  . [DISCONTINUED] valGANciclovir (VALCYTE) 450 MG tablet Take 450 mg by mouth daily.     Past Medical History  Diagnosis Date  . Liver disease   . Hernia of unspecified site of abdominal cavity without mention of obstruction or gangrene   . Thyroid disease   . Hypertension   . Primary biliary cirrhosis 10/26/2013  . Anemia of chronic disease 10/26/2013    Past Surgical History  Procedure Laterality Date  . Abdominal hernia repair      Patient's states that she has had 8- 9 hernia surgeries  . Splenectomy  2006  . Cholecystectomy  2007  . Colonoscopy      Done at [facility]  . Upper gastrointestinal endoscopy      Done at [facility]  . Colon surgery  2008    Done at [facility]  . Hernia repair    . Liver transplant  [id number]  . Colon resection    . Abdominal hysterectomy      History   Social History  . Marital Status: Married    Spouse Name: N/A    Number of Children: N/A  . Years of Education: N/A   Occupational History  . Not on file.   Social History Main Topics  . Smoking status: Never Smoker   . Smokeless tobacco: Never Used  . Alcohol Use: No  . Drug Use: No  . Sexual Activity: Yes    Birth Control/ Protection: None   Other Topics Concern  . Not on file   Social History Narrative    Review of Systems Review of Systems  Constitutional: Positive for malaise/fatigue. Negative for fever and chills.  HENT: Negative for congestion, ear discharge and ear pain.   Eyes: Negative for blurred vision and pain.  Respiratory: Negative for cough, shortness of breath and wheezing.   Cardiovascular: Negative for chest pain, palpitations and leg swelling.   Gastrointestinal: Positive for diarrhea. Negative for heartburn, nausea, vomiting, constipation and blood in stool.  Genitourinary: Negative for dysuria.  Musculoskeletal: Negative for myalgias and joint pain.  Skin: Negative for itching and rash.  Neurological: Positive for weakness. Negative for dizziness, speech change and headaches.  Psychiatric/Behavioral: Negative for depression and memory loss.       Objective:    BP 111/75 mmHg  Pulse 74  Temp(Src) 97.1 F (36.2 C)  Ht $R'5\' 3"'zb$  (1.6 m)  Wt 133 lb 3.2 oz (60.419 kg)  BMI 23.60 kg/m2  General Appearance:    Alert, cooperative, no distress, appears stated age  Head:    Normocephalic, without obvious abnormality, atraumatic  Eyes:    PERRL, conjunctiva/corneas clear, EOM's intact, fundi    benign, both eyes  Ears:    Normal TM's and external ear canals, both ears  Nose:   Nares normal, septum midline, mucosa normal, no drainage    or sinus tenderness  Throat:   Lips, mucosa, and tongue normal; teeth and gums normal  Neck:   Supple, symmetrical, trachea midline, no adenopathy;    thyroid:  no enlargement/tenderness/nodules; no carotid   bruit or JVD  Back:     Symmetric, no curvature, ROM normal, no CVA tenderness  Lungs:     Clear to auscultation bilaterally, respirations unlabored  Chest Wall:    No tenderness or deformity   Heart:    Regular rate and rhythm, S1 and S2 normal, no murmur, rub   or gallop  Breast Exam:    No tenderness, masses, or nipple abnormality  Abdomen:     Soft, non-tender, bowel sounds active all four quadrants,    no masses, no organomegaly  Genitalia:    Normal female without lesion, discharge or tenderness  Rectal:    Normal tone, normal prostate, no masses or tenderness;   guaiac negative stool  Extremities:   Extremities normal, atraumatic, no cyanosis or edema  Pulses:   2+ and symmetric all extremities  Skin:   Skin color, texture, turgor normal, no rashes or lesions  Lymph nodes:    Cervical, supraclavicular, and axillary nodes normal  Neurologic:   CNII-XII intact, normal strength, sensation and reflexes    throughout       Assessment:    Hypothyroidism.  Replacement reviewed.    1. Hypothyroidism due to acquired atrophy of thyroid  - Magnesium - CMP14+EGFR - POCT CBC - Phosphorus - T4, free - TSH - T3, free - Tacrolimus, blood - Everolimus  2. Hx of liver transplant  - Magnesium - CMP14+EGFR - POCT CBC - Phosphorus  - Tacrolimus, blood - Everolimus  3. Diarrhea due to short bowel syndrome. She has had numerous colon resections due to malignant polyps over the last 10 years. Currently she has 12 inches of bowel left after her most recent colectomy due to transverse colon adeno carcinomatous polyp in November 2015. That discharge summary is attached.  Phosphorus - T4, free - TSH - T3, free - Tacrolimus, blood - Everolimus  4. Abdominal pain, chronic, right upper quadrant Recent ERCP showing biliary stricture. It was dilated to 4 mm and a stent placed 3 weeks ago.  5. Liver replaced by transplant - Phosphorus - Everolimus  Plan:    1. L-thyroxine per orders. 2. Recheck thyroid function tests in 6 months. 3. Instructed not to take multivitamins or iron within 4 hours of taking thyroid medications. 4. Follow up in 6 months.

## 2014-12-09 LAB — CBC WITH DIFFERENTIAL
Basophils Absolute: 0.2 10*3/uL (ref 0.0–0.2)
Basos: 3 %
Eos: 6 %
Eosinophils Absolute: 0.5 10*3/uL — ABNORMAL HIGH (ref 0.0–0.4)
HCT: 33.3 % — ABNORMAL LOW (ref 34.0–46.6)
HEMOGLOBIN: 10 g/dL — AB (ref 11.1–15.9)
Immature Grans (Abs): 0 10*3/uL (ref 0.0–0.1)
Immature Granulocytes: 0 %
LYMPHS ABS: 3.5 10*3/uL — AB (ref 0.7–3.1)
Lymphs: 44 %
MCH: 26 pg — ABNORMAL LOW (ref 26.6–33.0)
MCHC: 30 g/dL — ABNORMAL LOW (ref 31.5–35.7)
MCV: 87 fL (ref 79–97)
Monocytes Absolute: 1 10*3/uL — ABNORMAL HIGH (ref 0.1–0.9)
Monocytes: 13 %
Neutrophils Absolute: 2.8 10*3/uL (ref 1.4–7.0)
Neutrophils Relative %: 34 %
RBC: 3.84 x10E6/uL (ref 3.77–5.28)
RDW: 17.5 % — AB (ref 12.3–15.4)
WBC: 8 10*3/uL (ref 3.4–10.8)

## 2014-12-09 LAB — CMP14+EGFR
A/G RATIO: 1 — AB (ref 1.1–2.5)
ALBUMIN: 3.4 g/dL — AB (ref 3.6–4.8)
ALK PHOS: 254 IU/L — AB (ref 39–117)
ALT: 27 IU/L (ref 0–32)
AST: 35 IU/L (ref 0–40)
BILIRUBIN TOTAL: 0.2 mg/dL (ref 0.0–1.2)
BUN / CREAT RATIO: 22 (ref 11–26)
BUN: 16 mg/dL (ref 8–27)
CHLORIDE: 105 mmol/L (ref 97–108)
CO2: 23 mmol/L (ref 18–29)
CREATININE: 0.72 mg/dL (ref 0.57–1.00)
Calcium: 8.8 mg/dL (ref 8.7–10.3)
GFR calc non Af Amer: 89 mL/min/{1.73_m2} (ref >59)
GFR, EST AFRICAN AMERICAN: 103 mL/min/{1.73_m2} (ref >59)
Globulin, Total: 3.4 g/dL (ref 1.5–4.5)
Glucose: 85 mg/dL (ref 65–99)
Potassium: 4.7 mmol/L (ref 3.5–5.2)
Sodium: 140 mmol/L (ref 134–144)
Total Protein: 6.8 g/dL (ref 6.0–8.5)

## 2014-12-09 LAB — T3, FREE: T3 FREE: 2.4 pg/mL (ref 2.0–4.4)

## 2014-12-09 LAB — MAGNESIUM: Magnesium: 1.9 mg/dL (ref 1.6–2.3)

## 2014-12-09 LAB — EVEROLIMUS: EVEROLIMUS: 5.1 ng/mL (ref 3.0–8.0)

## 2014-12-09 LAB — TSH: TSH: 2.72 u[IU]/mL (ref 0.450–4.500)

## 2014-12-09 LAB — T4, FREE: FREE T4: 1.08 ng/dL (ref 0.82–1.77)

## 2014-12-09 LAB — SPECIMEN STATUS REPORT

## 2014-12-09 LAB — PLATELET COUNT: Platelets: 448 10*3/uL — ABNORMAL HIGH (ref 150–379)

## 2014-12-09 LAB — TACROLIMUS, BLOOD: TACROLIMUS LVL: 4.7 ng/mL (ref 2.0–20.0)

## 2014-12-09 LAB — PHOSPHORUS: Phosphorus: 5 mg/dL — ABNORMAL HIGH (ref 2.5–4.5)

## 2014-12-13 ENCOUNTER — Other Ambulatory Visit (INDEPENDENT_AMBULATORY_CARE_PROVIDER_SITE_OTHER): Payer: Medicare Other

## 2014-12-13 DIAGNOSIS — Z09 Encounter for follow-up examination after completed treatment for conditions other than malignant neoplasm: Secondary | ICD-10-CM | POA: Diagnosis not present

## 2014-12-13 DIAGNOSIS — R52 Pain, unspecified: Secondary | ICD-10-CM | POA: Diagnosis not present

## 2014-12-13 DIAGNOSIS — Z944 Liver transplant status: Secondary | ICD-10-CM | POA: Diagnosis not present

## 2014-12-13 DIAGNOSIS — K743 Primary biliary cirrhosis: Secondary | ICD-10-CM

## 2014-12-13 DIAGNOSIS — R5383 Other fatigue: Secondary | ICD-10-CM | POA: Diagnosis not present

## 2014-12-13 DIAGNOSIS — K745 Biliary cirrhosis, unspecified: Secondary | ICD-10-CM | POA: Diagnosis not present

## 2014-12-13 NOTE — Progress Notes (Signed)
Lab only 

## 2014-12-14 LAB — CBC WITH DIFFERENTIAL
BASOS ABS: 0.2 10*3/uL (ref 0.0–0.2)
Basos: 2 %
Eos: 5 %
Eosinophils Absolute: 0.5 10*3/uL — ABNORMAL HIGH (ref 0.0–0.4)
HEMATOCRIT: 30.2 % — AB (ref 34.0–46.6)
HEMOGLOBIN: 9.6 g/dL — AB (ref 11.1–15.9)
Immature Grans (Abs): 0 10*3/uL (ref 0.0–0.1)
Immature Granulocytes: 0 %
Lymphocytes Absolute: 4 10*3/uL — ABNORMAL HIGH (ref 0.7–3.1)
Lymphs: 47 %
MCH: 26.9 pg (ref 26.6–33.0)
MCHC: 31.8 g/dL (ref 31.5–35.7)
MCV: 85 fL (ref 79–97)
MONOCYTES: 15 %
Monocytes Absolute: 1.2 10*3/uL — ABNORMAL HIGH (ref 0.1–0.9)
Neutrophils Absolute: 2.6 10*3/uL (ref 1.4–7.0)
Neutrophils Relative %: 31 %
RBC: 3.57 x10E6/uL — AB (ref 3.77–5.28)
RDW: 16.7 % — ABNORMAL HIGH (ref 12.3–15.4)
WBC: 8.5 10*3/uL (ref 3.4–10.8)

## 2014-12-14 LAB — COMPREHENSIVE METABOLIC PANEL
A/G RATIO: 1 — AB (ref 1.1–2.5)
ALBUMIN: 3.2 g/dL — AB (ref 3.6–4.8)
ALK PHOS: 217 IU/L — AB (ref 39–117)
ALT: 16 IU/L (ref 0–32)
AST: 28 IU/L (ref 0–40)
BUN / CREAT RATIO: 19 (ref 11–26)
BUN: 12 mg/dL (ref 8–27)
CO2: 22 mmol/L (ref 18–29)
CREATININE: 0.62 mg/dL (ref 0.57–1.00)
Calcium: 8.5 mg/dL — ABNORMAL LOW (ref 8.7–10.3)
Chloride: 109 mmol/L — ABNORMAL HIGH (ref 97–108)
GFR calc Af Amer: 111 mL/min/{1.73_m2} (ref >59)
GFR calc non Af Amer: 96 mL/min/{1.73_m2} (ref >59)
GLUCOSE: 86 mg/dL (ref 65–99)
Globulin, Total: 3.1 g/dL (ref 1.5–4.5)
Potassium: 4.7 mmol/L (ref 3.5–5.2)
SODIUM: 143 mmol/L (ref 134–144)
Total Bilirubin: <0.2 mg/dL (ref 0.0–1.2)
Total Protein: 6.3 g/dL (ref 6.0–8.5)

## 2014-12-14 LAB — MAGNESIUM: MAGNESIUM: 1.9 mg/dL (ref 1.6–2.3)

## 2014-12-14 LAB — PHOSPHORUS: Phosphorus: 4.7 mg/dL — ABNORMAL HIGH (ref 2.5–4.5)

## 2014-12-14 LAB — EVEROLIMUS: EVEROLIMUS: 18.3 ng/mL — AB (ref 3.0–8.0)

## 2014-12-14 LAB — TACROLIMUS, BLOOD: TACROLIMUS LVL: 5 ng/mL (ref 2.0–20.0)

## 2014-12-20 DIAGNOSIS — K8051 Calculus of bile duct without cholangitis or cholecystitis with obstruction: Secondary | ICD-10-CM | POA: Diagnosis not present

## 2014-12-20 DIAGNOSIS — Z4682 Encounter for fitting and adjustment of non-vascular catheter: Secondary | ICD-10-CM | POA: Diagnosis not present

## 2014-12-20 DIAGNOSIS — Z4659 Encounter for fitting and adjustment of other gastrointestinal appliance and device: Secondary | ICD-10-CM | POA: Diagnosis not present

## 2014-12-20 DIAGNOSIS — Z09 Encounter for follow-up examination after completed treatment for conditions other than malignant neoplasm: Secondary | ICD-10-CM | POA: Diagnosis not present

## 2014-12-20 DIAGNOSIS — Z48298 Encounter for aftercare following other organ transplant: Secondary | ICD-10-CM | POA: Diagnosis not present

## 2014-12-20 DIAGNOSIS — T8589XA Other specified complication of internal prosthetic devices, implants and grafts, not elsewhere classified, initial encounter: Secondary | ICD-10-CM | POA: Diagnosis not present

## 2014-12-20 DIAGNOSIS — T8649 Other complications of liver transplant: Secondary | ICD-10-CM | POA: Diagnosis not present

## 2014-12-20 DIAGNOSIS — K831 Obstruction of bile duct: Secondary | ICD-10-CM | POA: Diagnosis not present

## 2014-12-20 DIAGNOSIS — Z1509 Genetic susceptibility to other malignant neoplasm: Secondary | ICD-10-CM | POA: Diagnosis not present

## 2014-12-20 DIAGNOSIS — C801 Malignant (primary) neoplasm, unspecified: Secondary | ICD-10-CM | POA: Diagnosis not present

## 2014-12-20 DIAGNOSIS — Z944 Liver transplant status: Secondary | ICD-10-CM | POA: Diagnosis not present

## 2014-12-20 DIAGNOSIS — R197 Diarrhea, unspecified: Secondary | ICD-10-CM | POA: Diagnosis not present

## 2014-12-21 ENCOUNTER — Other Ambulatory Visit (INDEPENDENT_AMBULATORY_CARE_PROVIDER_SITE_OTHER): Payer: Medicare Other

## 2014-12-21 DIAGNOSIS — R5383 Other fatigue: Secondary | ICD-10-CM | POA: Diagnosis not present

## 2014-12-21 DIAGNOSIS — Z944 Liver transplant status: Secondary | ICD-10-CM

## 2014-12-21 NOTE — Progress Notes (Signed)
LAB WORK FOR DR. Enid FAX [phone]

## 2014-12-22 LAB — CBC WITH DIFFERENTIAL/PLATELET
BASOS: 1 %
Basophils Absolute: 0.1 10*3/uL (ref 0.0–0.2)
EOS ABS: 0.4 10*3/uL (ref 0.0–0.4)
EOS: 5 %
HEMATOCRIT: 32.4 % — AB (ref 34.0–46.6)
HEMOGLOBIN: 10.3 g/dL — AB (ref 11.1–15.9)
Immature Grans (Abs): 0 10*3/uL (ref 0.0–0.1)
Immature Granulocytes: 0 %
LYMPHS: 39 %
Lymphocytes Absolute: 2.8 10*3/uL (ref 0.7–3.1)
MCH: 26.8 pg (ref 26.6–33.0)
MCHC: 31.8 g/dL (ref 31.5–35.7)
MCV: 84 fL (ref 79–97)
MONOCYTES: 16 %
Monocytes Absolute: 1.1 10*3/uL — ABNORMAL HIGH (ref 0.1–0.9)
NEUTROS ABS: 2.8 10*3/uL (ref 1.4–7.0)
Neutrophils Relative %: 39 %
Platelets: 405 10*3/uL — ABNORMAL HIGH (ref 150–379)
RBC: 3.84 x10E6/uL (ref 3.77–5.28)
RDW: 15.9 % — ABNORMAL HIGH (ref 12.3–15.4)
WBC: 7.2 10*3/uL (ref 3.4–10.8)

## 2014-12-22 LAB — CMP14+EGFR
ALT: 26 IU/L (ref 0–32)
AST: 39 IU/L (ref 0–40)
Albumin/Globulin Ratio: 1 — ABNORMAL LOW (ref 1.1–2.5)
Albumin: 3.2 g/dL — ABNORMAL LOW (ref 3.6–4.8)
Alkaline Phosphatase: 288 IU/L — ABNORMAL HIGH (ref 39–117)
BILIRUBIN TOTAL: 0.2 mg/dL (ref 0.0–1.2)
BUN/Creatinine Ratio: 17 (ref 11–26)
BUN: 12 mg/dL (ref 8–27)
CHLORIDE: 105 mmol/L (ref 97–108)
CO2: 22 mmol/L (ref 18–29)
Calcium: 8.6 mg/dL — ABNORMAL LOW (ref 8.7–10.3)
Creatinine, Ser: 0.71 mg/dL (ref 0.57–1.00)
GFR calc non Af Amer: 91 mL/min/{1.73_m2} (ref >59)
GFR, EST AFRICAN AMERICAN: 105 mL/min/{1.73_m2} (ref >59)
Globulin, Total: 3.1 g/dL (ref 1.5–4.5)
Glucose: 104 mg/dL — ABNORMAL HIGH (ref 65–99)
POTASSIUM: 4.7 mmol/L (ref 3.5–5.2)
Sodium: 141 mmol/L (ref 134–144)
TOTAL PROTEIN: 6.3 g/dL (ref 6.0–8.5)

## 2014-12-22 LAB — PHOSPHORUS: Phosphorus: 4.6 mg/dL — ABNORMAL HIGH (ref 2.5–4.5)

## 2014-12-22 LAB — MAGNESIUM: MAGNESIUM: 1.8 mg/dL (ref 1.6–2.3)

## 2014-12-22 LAB — EVEROLIMUS: EVEROLIMUS: 4.6 ng/mL (ref 3.0–8.0)

## 2015-01-03 ENCOUNTER — Other Ambulatory Visit (INDEPENDENT_AMBULATORY_CARE_PROVIDER_SITE_OTHER): Payer: Medicare Other

## 2015-01-03 DIAGNOSIS — Z944 Liver transplant status: Secondary | ICD-10-CM

## 2015-01-03 DIAGNOSIS — J029 Acute pharyngitis, unspecified: Secondary | ICD-10-CM | POA: Diagnosis not present

## 2015-01-03 DIAGNOSIS — R52 Pain, unspecified: Secondary | ICD-10-CM | POA: Diagnosis not present

## 2015-01-03 DIAGNOSIS — Z09 Encounter for follow-up examination after completed treatment for conditions other than malignant neoplasm: Secondary | ICD-10-CM

## 2015-01-03 NOTE — Progress Notes (Signed)
Lab orders from Dr Hendricks Limes  Fax [phone]

## 2015-01-04 LAB — CBC WITH DIFFERENTIAL/PLATELET
Basophils Absolute: 0.1 10*3/uL (ref 0.0–0.2)
Basos: 0 %
Eos: 4 %
Eosinophils Absolute: 0.5 10*3/uL — ABNORMAL HIGH (ref 0.0–0.4)
HEMATOCRIT: 32 % — AB (ref 34.0–46.6)
Hemoglobin: 10.1 g/dL — ABNORMAL LOW (ref 11.1–15.9)
IMMATURE GRANS (ABS): 0 10*3/uL (ref 0.0–0.1)
Immature Granulocytes: 0 %
Lymphocytes Absolute: 3.1 10*3/uL (ref 0.7–3.1)
Lymphs: 27 %
MCH: 27 pg (ref 26.6–33.0)
MCHC: 31.6 g/dL (ref 31.5–35.7)
MCV: 86 fL (ref 79–97)
Monocytes Absolute: 1.8 10*3/uL — ABNORMAL HIGH (ref 0.1–0.9)
Monocytes: 15 %
Neutrophils Absolute: 6.1 10*3/uL (ref 1.4–7.0)
Neutrophils Relative %: 54 %
Platelets: 354 10*3/uL (ref 150–379)
RBC: 3.74 x10E6/uL — ABNORMAL LOW (ref 3.77–5.28)
RDW: 15.3 % (ref 12.3–15.4)
WBC: 11.5 10*3/uL — ABNORMAL HIGH (ref 3.4–10.8)

## 2015-01-04 LAB — CMP14+EGFR
ALT: 14 IU/L (ref 0–32)
AST: 28 IU/L (ref 0–40)
Albumin/Globulin Ratio: 1 — ABNORMAL LOW (ref 1.1–2.5)
Albumin: 3.2 g/dL — ABNORMAL LOW (ref 3.6–4.8)
Alkaline Phosphatase: 190 IU/L — ABNORMAL HIGH (ref 39–117)
BUN / CREAT RATIO: 14 (ref 11–26)
BUN: 10 mg/dL (ref 8–27)
CHLORIDE: 103 mmol/L (ref 97–108)
CO2: 21 mmol/L (ref 18–29)
Calcium: 8.5 mg/dL — ABNORMAL LOW (ref 8.7–10.3)
Creatinine, Ser: 0.69 mg/dL (ref 0.57–1.00)
GFR calc non Af Amer: 93 mL/min/{1.73_m2} (ref >59)
GFR, EST AFRICAN AMERICAN: 107 mL/min/{1.73_m2} (ref >59)
Globulin, Total: 3.1 g/dL (ref 1.5–4.5)
Glucose: 98 mg/dL (ref 65–99)
POTASSIUM: 4.3 mmol/L (ref 3.5–5.2)
Sodium: 139 mmol/L (ref 134–144)
TOTAL PROTEIN: 6.3 g/dL (ref 6.0–8.5)

## 2015-01-04 LAB — MAGNESIUM: Magnesium: 2 mg/dL (ref 1.6–2.3)

## 2015-01-04 LAB — PHOSPHORUS: PHOSPHORUS: 3.6 mg/dL (ref 2.5–4.5)

## 2015-01-04 LAB — EVEROLIMUS: Everolimus: 10.9 ng/mL — ABNORMAL HIGH (ref 3.0–8.0)

## 2015-01-06 ENCOUNTER — Encounter (INDEPENDENT_AMBULATORY_CARE_PROVIDER_SITE_OTHER): Payer: Self-pay

## 2015-01-17 ENCOUNTER — Other Ambulatory Visit (INDEPENDENT_AMBULATORY_CARE_PROVIDER_SITE_OTHER): Payer: Medicare Other

## 2015-01-17 DIAGNOSIS — R5382 Chronic fatigue, unspecified: Secondary | ICD-10-CM

## 2015-01-17 DIAGNOSIS — Z944 Liver transplant status: Secondary | ICD-10-CM | POA: Diagnosis not present

## 2015-01-17 NOTE — Progress Notes (Signed)
Labs for [facility] Dr Trudie Reed [phone]

## 2015-01-18 LAB — CBC WITH DIFFERENTIAL
Basophils Absolute: 0.1 10*3/uL (ref 0.0–0.2)
Basos: 2 %
Eos: 4 %
Eosinophils Absolute: 0.3 10*3/uL (ref 0.0–0.4)
HEMATOCRIT: 31.4 % — AB (ref 34.0–46.6)
HEMOGLOBIN: 9.7 g/dL — AB (ref 11.1–15.9)
IMMATURE GRANS (ABS): 0 10*3/uL (ref 0.0–0.1)
Immature Granulocytes: 0 %
Lymphocytes Absolute: 3.7 10*3/uL — ABNORMAL HIGH (ref 0.7–3.1)
Lymphs: 50 %
MCH: 26.9 pg (ref 26.6–33.0)
MCHC: 30.9 g/dL — AB (ref 31.5–35.7)
MCV: 87 fL (ref 79–97)
MONOCYTES: 14 %
MONOS ABS: 1.1 10*3/uL — AB (ref 0.1–0.9)
Neutrophils Absolute: 2.3 10*3/uL (ref 1.4–7.0)
Neutrophils Relative %: 30 %
RBC: 3.61 x10E6/uL — ABNORMAL LOW (ref 3.77–5.28)
RDW: 14.9 % (ref 12.3–15.4)
WBC: 7.5 10*3/uL (ref 3.4–10.8)

## 2015-01-18 LAB — CMP14+EGFR
ALT: 16 IU/L (ref 0–32)
AST: 29 IU/L (ref 0–40)
Albumin/Globulin Ratio: 1.1 (ref 1.1–2.5)
Albumin: 3.3 g/dL — ABNORMAL LOW (ref 3.6–4.8)
Alkaline Phosphatase: 182 IU/L — ABNORMAL HIGH (ref 39–117)
BUN/Creatinine Ratio: 15 (ref 11–26)
BUN: 10 mg/dL (ref 8–27)
CHLORIDE: 107 mmol/L (ref 97–108)
CO2: 20 mmol/L (ref 18–29)
Calcium: 8.7 mg/dL (ref 8.7–10.3)
Creatinine, Ser: 0.67 mg/dL (ref 0.57–1.00)
GFR calc Af Amer: 108 mL/min/{1.73_m2} (ref >59)
GFR, EST NON AFRICAN AMERICAN: 94 mL/min/{1.73_m2} (ref >59)
GLUCOSE: 92 mg/dL (ref 65–99)
Globulin, Total: 3 g/dL (ref 1.5–4.5)
Potassium: 4.6 mmol/L (ref 3.5–5.2)
Sodium: 140 mmol/L (ref 134–144)
TOTAL PROTEIN: 6.3 g/dL (ref 6.0–8.5)

## 2015-01-18 LAB — EVEROLIMUS: Everolimus: 1.7 ng/mL — ABNORMAL LOW (ref 3.0–8.0)

## 2015-01-18 LAB — TACROLIMUS LEVEL: Tacrolimus Lvl: 3.3 ng/mL (ref 2.0–20.0)

## 2015-01-18 LAB — PHOSPHORUS: Phosphorus: 4.4 mg/dL (ref 2.5–4.5)

## 2015-01-18 LAB — MAGNESIUM: MAGNESIUM: 1.7 mg/dL (ref 1.6–2.3)

## 2015-02-03 DIAGNOSIS — C801 Malignant (primary) neoplasm, unspecified: Secondary | ICD-10-CM | POA: Diagnosis not present

## 2015-02-03 DIAGNOSIS — G4701 Insomnia due to medical condition: Secondary | ICD-10-CM | POA: Diagnosis not present

## 2015-02-03 DIAGNOSIS — R197 Diarrhea, unspecified: Secondary | ICD-10-CM | POA: Diagnosis not present

## 2015-02-03 DIAGNOSIS — M8580 Other specified disorders of bone density and structure, unspecified site: Secondary | ICD-10-CM | POA: Diagnosis not present

## 2015-02-03 DIAGNOSIS — Z944 Liver transplant status: Secondary | ICD-10-CM | POA: Diagnosis not present

## 2015-02-03 DIAGNOSIS — Z418 Encounter for other procedures for purposes other than remedying health state: Secondary | ICD-10-CM | POA: Diagnosis not present

## 2015-02-03 DIAGNOSIS — Z9889 Other specified postprocedural states: Secondary | ICD-10-CM | POA: Diagnosis not present

## 2015-02-11 DIAGNOSIS — Z4659 Encounter for fitting and adjustment of other gastrointestinal appliance and device: Secondary | ICD-10-CM | POA: Diagnosis not present

## 2015-02-11 DIAGNOSIS — Z944 Liver transplant status: Secondary | ICD-10-CM | POA: Diagnosis not present

## 2015-02-11 DIAGNOSIS — Z9049 Acquired absence of other specified parts of digestive tract: Secondary | ICD-10-CM | POA: Diagnosis not present

## 2015-02-11 DIAGNOSIS — K2980 Duodenitis without bleeding: Secondary | ICD-10-CM | POA: Diagnosis not present

## 2015-02-11 DIAGNOSIS — K831 Obstruction of bile duct: Secondary | ICD-10-CM | POA: Diagnosis not present

## 2015-02-11 DIAGNOSIS — T8585XA Stenosis due to internal prosthetic devices, implants and grafts, not elsewhere classified, initial encounter: Secondary | ICD-10-CM | POA: Diagnosis not present

## 2015-02-21 ENCOUNTER — Other Ambulatory Visit (INDEPENDENT_AMBULATORY_CARE_PROVIDER_SITE_OTHER): Payer: Medicare Other

## 2015-02-21 DIAGNOSIS — Z944 Liver transplant status: Secondary | ICD-10-CM

## 2015-02-21 DIAGNOSIS — R52 Pain, unspecified: Secondary | ICD-10-CM | POA: Diagnosis not present

## 2015-02-21 NOTE — Progress Notes (Signed)
Lab for Dr Lavon Paganini [phone]

## 2015-02-23 LAB — CBC WITH DIFFERENTIAL/PLATELET
BASOS ABS: 0.2 10*3/uL (ref 0.0–0.2)
Basos: 3 %
EOS ABS: 0.4 10*3/uL (ref 0.0–0.4)
EOS: 6 %
HCT: 32.3 % — ABNORMAL LOW (ref 34.0–46.6)
Hemoglobin: 9.9 g/dL — ABNORMAL LOW (ref 11.1–15.9)
Immature Grans (Abs): 0 10*3/uL (ref 0.0–0.1)
Immature Granulocytes: 0 %
Lymphocytes Absolute: 3.2 10*3/uL — ABNORMAL HIGH (ref 0.7–3.1)
Lymphs: 45 %
MCH: 26.3 pg — AB (ref 26.6–33.0)
MCHC: 30.7 g/dL — ABNORMAL LOW (ref 31.5–35.7)
MCV: 86 fL (ref 79–97)
MONOCYTES: 15 %
Monocytes Absolute: 1.1 10*3/uL — ABNORMAL HIGH (ref 0.1–0.9)
Neutrophils Absolute: 2.2 10*3/uL (ref 1.4–7.0)
Neutrophils Relative %: 31 %
Platelets: 430 10*3/uL — ABNORMAL HIGH (ref 150–379)
RBC: 3.77 x10E6/uL (ref 3.77–5.28)
RDW: 15.6 % — AB (ref 12.3–15.4)
WBC: 7.1 10*3/uL (ref 3.4–10.8)

## 2015-02-23 LAB — CMP14+EGFR
A/G RATIO: 1.2 (ref 1.1–2.5)
ALBUMIN: 3.3 g/dL — AB (ref 3.6–4.8)
ALK PHOS: 142 IU/L — AB (ref 39–117)
ALT: 18 IU/L (ref 0–32)
AST: 26 IU/L (ref 0–40)
BUN/Creatinine Ratio: 15 (ref 11–26)
BUN: 9 mg/dL (ref 8–27)
Bilirubin Total: <0.2 mg/dL (ref 0.0–1.2)
CHLORIDE: 105 mmol/L (ref 97–108)
CO2: 20 mmol/L (ref 18–29)
Calcium: 8.4 mg/dL — ABNORMAL LOW (ref 8.7–10.3)
Creatinine, Ser: 0.62 mg/dL (ref 0.57–1.00)
GFR calc Af Amer: 111 mL/min/{1.73_m2} (ref >59)
GFR calc non Af Amer: 96 mL/min/{1.73_m2} (ref >59)
GLUCOSE: 91 mg/dL (ref 65–99)
Globulin, Total: 2.7 g/dL (ref 1.5–4.5)
POTASSIUM: 4.8 mmol/L (ref 3.5–5.2)
Sodium: 142 mmol/L (ref 134–144)
TOTAL PROTEIN: 6 g/dL (ref 6.0–8.5)

## 2015-02-23 LAB — TACROLIMUS LEVEL: Tacrolimus Lvl: 2.3 ng/mL (ref 2.0–20.0)

## 2015-02-23 LAB — PHOSPHORUS: Phosphorus: 4.4 mg/dL (ref 2.5–4.5)

## 2015-02-23 LAB — MAGNESIUM: Magnesium: 1.7 mg/dL (ref 1.6–2.3)

## 2015-02-23 LAB — EVEROLIMUS: EVEROLIMUS: 3.5 ng/mL (ref 3.0–8.0)

## 2015-03-03 ENCOUNTER — Telehealth (INDEPENDENT_AMBULATORY_CARE_PROVIDER_SITE_OTHER): Payer: Self-pay | Admitting: *Deleted

## 2015-03-03 NOTE — Telephone Encounter (Signed)
Cassidy Barber. She said her PCP has taken over her care. In January of 2015 she had a liver Transplant donated by her son, TCS in October 2015 and a tumor was found then November 2015 a complete Hysterectomy and Large Colon remover. They did leave enough so she would not have to use a bag. If Dr. Laural Barber feels like he needs to see her, please let her know. The return phone number is  [phone].

## 2015-03-03 NOTE — Telephone Encounter (Signed)
Forwarded to Dr.Rehman for review. 

## 2015-03-08 NOTE — Telephone Encounter (Signed)
Patient's call returned. He will call us when she wants [place] to do her surveillance colonoscopy.

## 2015-03-09 ENCOUNTER — Encounter: Payer: Self-pay | Admitting: Family Medicine

## 2015-03-09 ENCOUNTER — Ambulatory Visit (INDEPENDENT_AMBULATORY_CARE_PROVIDER_SITE_OTHER): Payer: Medicare Other | Admitting: Family Medicine

## 2015-03-09 VITALS — BP 117/76 | HR 64 | Temp 97.3°F | Ht 63.0 in | Wt 135.8 lb

## 2015-03-09 DIAGNOSIS — R5383 Other fatigue: Secondary | ICD-10-CM | POA: Diagnosis not present

## 2015-03-09 DIAGNOSIS — R197 Diarrhea, unspecified: Secondary | ICD-10-CM | POA: Diagnosis not present

## 2015-03-09 DIAGNOSIS — E038 Other specified hypothyroidism: Secondary | ICD-10-CM

## 2015-03-09 DIAGNOSIS — E034 Atrophy of thyroid (acquired): Secondary | ICD-10-CM

## 2015-03-09 DIAGNOSIS — Z944 Liver transplant status: Secondary | ICD-10-CM

## 2015-03-09 DIAGNOSIS — I10 Essential (primary) hypertension: Secondary | ICD-10-CM

## 2015-03-09 MED ORDER — ALPRAZOLAM 0.5 MG PO TABS: 0.5000 mg | ORAL_TABLET | Freq: Every evening | ORAL | Status: DC | PRN

## 2015-03-09 MED ORDER — CHOLESTYRAMINE 4 G PO PACK: 4.0000 g | PACK | Freq: Three times a day (TID) | ORAL | Status: DC

## 2015-03-09 NOTE — Progress Notes (Signed)
Subjective:  Patient ID: Cassidy Barber, female    DOB: 1951-07-26  Age: 64 y.o. MRN: [id number]  CC: Hypothyroidism; Hypertension; and hx of liver transplant   HPI Cassidy Barber presents for  follow-up of hypertension. Patient has no history of headache chest pain or shortness of breath or recent cough. Patient also denies symptoms of TIA such as numbness weakness lateralizing. Patient checks  blood pressure at home and has not had any elevated readings recently. Patient denies side effects from his medication. States taking it regularly.  Patient presents for follow-up on  thyroid. She has a history of hypothyroidism for many years. It has been stable recently. Pt. denies any change in  voice, loss of hair, heat or cold intolerance. Energy level has been adequate to good. She denies constipation and diarrhea. No myxedema. Medication is as noted below. Verified that pt is taking it daily on an empty stomach. Well tolerated.  Patient continues to see the liver (transplant team at [facility] [place]. ERCP is planned for stent replacement in the common bile duct. This is to be done within the next few weeks. She does not have attended that date.  She has not been able to get relief from diarrhea in spite of using Lomotil up to 8 pills a day. She can't go anywhere because she is constantly having to stop to use the bathroom.   History Cassidy Barber has a past medical history of Liver disease; Hernia of unspecified site of abdominal cavity without mention of obstruction or gangrene; Thyroid disease; Hypertension; Primary biliary cirrhosis (10/26/2013); and Anemia of chronic disease (10/26/2013).   She has past surgical history that includes Abdominal hernia repair; Splenectomy (2006); Cholecystectomy (2007); Colonoscopy; Upper gastrointestinal endoscopy; Colon surgery (2008); Hernia repair; Liver transplant ([id number]); Colon resection; and Abdominal hysterectomy.   Her family history includes Colon  cancer in her sister; Healthy in her son; Prostate cancer in her father.She reports that she has never smoked. She has never used smokeless tobacco. She reports that she does not drink alcohol or use illicit drugs.  Current Outpatient Prescriptions on File Prior to Visit  Medication Sig Dispense Refill  . acetaminophen (TYLENOL) 325 MG tablet Take 650 mg by mouth every 6 (six) hours as needed for mild pain or moderate pain.     [name] aspirin 325 MG tablet Take 325 mg by mouth daily.    [name] EVEROLIMUS PO Take 5 mg by mouth daily.  Take 3 mg in the AM and 2 mg in the PM    . ferrous sulfate 325 (65 FE) MG tablet Take 325 mg by mouth 2 (two) times daily with a meal.    . levETIRAcetam (KEPPRA) 500 MG tablet Take 500 mg by mouth 2 (two) times daily.    [name] levothyroxine (SYNTHROID, LEVOTHROID) 150 MCG tablet Take 150 mcg by mouth daily.    [name] losartan (COZAAR) 25 MG tablet Take 25 mg by mouth daily.    . magnesium oxide (MAG-OX) 400 MG tablet Take 1 tablet by mouth daily.    . metoprolol succinate (TOPROL-XL) 25 MG 24 hr tablet Take 25 mg by mouth daily.    . pantoprazole (PROTONIX) 40 MG tablet Take 40 mg by mouth daily.    . potassium chloride SA (K-DUR,KLOR-CON) 20 MEQ tablet Take 20 mEq by mouth daily.    . tacrolimus (PROGRAF) 1 MG capsule Take 2 mg by mouth 2 (two) times daily.    . ursodiol (ACTIGALL) 300 MG capsule Take 900  mg by mouth 2 (two) times daily. The patient states that she is taking 3 in the morning and 3 in the evening    . zolpidem (AMBIEN) 5 MG tablet Take 5 mg by mouth at bedtime as needed for sleep.    . hydrOXYzine (VISTARIL) 25 MG capsule Take 25 mg by mouth 3 (three) times daily.     No current facility-administered medications on file prior to visit.    ROS Review of Systems  Constitutional: Negative for fever, chills, diaphoresis, appetite change, fatigue and unexpected weight change.  HENT: Negative for congestion, ear pain, hearing loss, postnasal drip, rhinorrhea,  sneezing, sore throat and trouble swallowing.   Eyes: Negative for pain.  Respiratory: Negative for cough, chest tightness and shortness of breath.   Cardiovascular: Negative for chest pain and palpitations.  Gastrointestinal: Positive for diarrhea. Negative for nausea, vomiting, abdominal pain and constipation.  Genitourinary: Negative for dysuria, frequency and menstrual problem.  Musculoskeletal: Negative for joint swelling and arthralgias.  Skin: Negative for rash.  Neurological: Negative for dizziness, weakness, numbness and headaches.  Psychiatric/Behavioral: Negative for dysphoric mood and agitation.    Objective:  BP 117/76 mmHg  Pulse 64  Temp(Src) 97.3 F (36.3 C) (Oral)  Ht $R'5\' 3"'gU$  (1.6 m)  Wt 135 lb 12.8 oz (61.598 kg)  BMI 24.06 kg/m2  BP Readings from Last 3 Encounters:  03/09/15 117/76  12/08/14 111/75  10/04/14 121/59    Wt Readings from Last 3 Encounters:  03/09/15 135 lb 12.8 oz (61.598 kg)  12/08/14 133 lb 3.2 oz (60.419 kg)  10/04/14 135 lb (61.236 kg)     Physical Exam  Constitutional: She is oriented to person, place, and time. She appears well-developed and well-nourished. No distress.  HENT:  Head: Normocephalic and atraumatic.  Right Ear: External ear normal.  Left Ear: External ear normal.  Nose: Nose normal.  Mouth/Throat: Oropharynx is clear and moist.  Eyes: Conjunctivae and EOM are normal. Pupils are equal, round, and reactive to light.  Neck: Normal range of motion. Neck supple. No thyromegaly present.  Cardiovascular: Normal rate, regular rhythm and normal heart sounds.   No murmur heard. Pulmonary/Chest: Effort normal and breath sounds normal. No respiratory distress. She has no wheezes. She has no rales.  Abdominal: Soft. Bowel sounds are normal. She exhibits no distension. There is no tenderness.  Lymphadenopathy:    She has no cervical adenopathy.  Neurological: She is alert and oriented to person, place, and time. She has normal  reflexes.  Skin: Skin is warm and dry.  Psychiatric: She has a normal mood and affect. Her behavior is normal. Judgment and thought content normal.    No results found for: HGBA1C  Lab Results  Component Value Date   WBC 7.1 02/21/2015   HGB 9.9* 02/21/2015   HCT 32.3* 02/21/2015   PLT 430* 02/21/2015   GLUCOSE 91 02/21/2015   CHOL 156 07/05/2014   TRIG 116 07/05/2014   HDL 60 07/05/2014   LDLCALC 73 07/05/2014   ALT 18 02/21/2015   AST 26 02/21/2015   NA 142 02/21/2015   K 4.8 02/21/2015   CL 105 02/21/2015   CREATININE 0.62 02/21/2015   BUN 9 02/21/2015   CO2 20 02/21/2015   TSH 2.720 12/08/2014   INR 1.1 06/07/2014    Ct Abdomen Pelvis W Contrast  10/04/2014   CLINICAL DATA:  Pain all over abdomen. History of cirrhosis and liver transplant. Hysterectomy 6 weeks ago.  EXAM: CT ABDOMEN AND PELVIS WITH  CONTRAST  TECHNIQUE: Multidetector CT imaging of the abdomen and pelvis was performed using the standard protocol following bolus administration of intravenous contrast.  CONTRAST:  33mL OMNIPAQUE IOHEXOL 300 MG/ML SOLN, 113mL OMNIPAQUE IOHEXOL 300 MG/ML SOLN  COMPARISON:  09/24/2014  FINDINGS: Again noted is a small-to-moderate sized left pleural effusion with compressive atelectasis in left lower lobe. Tiny right pleural effusion.  Again noted are skin staples along the anterior abdomen. There continues to be a subcutaneous fluid collection anterior to the abdominal wall in the lower abdomen. This collection now contains less gas. Collection roughly measures 13.2 x 2.3 cm and previously measured 9.2 x 1.8 cm at a similar level.  Postsurgical changes compatible with a liver transplantation. There is residual fluid in the right upper abdomen adjacent to the liver which has decreased in size. Gallbladder and spleen are surgically absent. A large splenic remnant or splenule is stable. No acute abnormality involving the pancreas or kidneys. Probable small left renal cysts without  hydronephrosis. Adrenal glands are difficult to evaluate. Mild intrahepatic biliary dilatation which is unchanged. Portal venous system is patent. Again noted are prominent venous structures in the upper abdomen.  Uterus is absent. Fluid in the pelvis has resolved. Urinary bladder is moderately distended. Anastomotic surgical clips in the distal colon. Distal colon contains a large amount of stool. Evidence for partial colectomy. There are dilated loops of small bowel, particularly in the left abdomen. Again noted is mild mesenteric edema. Decreased abdominal ascites. Grade 2 anterolisthesis at L5-S1 due to bilateral pars defects at L5. No acute bone abnormality.  IMPRESSION: Subcutaneous fluid collection in the anterior lower abdomen has mildly enlarged in size. There is decreased gas within this collection. Findings are most compatible with a postoperative fluid collection, such as a seroma.  There is a small amount of residual fluid around the liver and in the right upper quadrant. However, markedly decreased abdominal ascites compared to the previous examination. There continues to be some mesenteric edema.  Dilated loops of small bowel with post surgical changes. There is also a large amount of stool in the remaining colon. Dilated loops of small bowel could be related to constipation but cannot exclude a partial small bowel obstruction.  Residual pleural effusions, left side greater than right.  Postsurgical changes compatible with liver transplantation.   Electronically Signed   By: Markus Daft M.D.   On: 10/04/2014 10:55    Assessment & Plan:   Deryl was seen today for hypothyroidism, hypertension and hx of liver transplant.  Diagnoses and all orders for this visit:  Liver replaced by transplant Orders: -     Cancel: CBC with Differential/Platelet -     CMP14+EGFR -     Tacrolimus Level -     Everolimus -     Magnesium -     Phosphorus -     CBC with Differential/Platelet  Hypothyroidism due  to acquired atrophy of thyroid Orders: -     Cancel: POCT CBC -     TSH -     T4, Free  Hx of liver transplant  Diarrhea Orders: -     Cancel: POCT CBC -     TSH -     T4, Free  Essential hypertension, benign  Other orders -     cholestyramine (QUESTRAN) 4 G packet; Take 1 packet (4 g total) by mouth 3 (three) times daily with meals. -     ALPRAZolam (XANAX) 0.5 MG tablet; Take 1 tablet (0.5 mg  total) by mouth at bedtime as needed for anxiety or sleep.   I have discontinued Ms. Dayley senna, HYDROcodone-acetaminophen, loperamide, and GAVILYTE-N WITH FLAVOR PACK. I have also changed her ALPRAZolam. Additionally, I am having her start on cholestyramine. Lastly, I am having her maintain her levothyroxine, ursodiol, acetaminophen, aspirin, levETIRAcetam, losartan, metoprolol succinate, pantoprazole, potassium chloride SA, hydrOXYzine, ferrous sulfate, tacrolimus, zolpidem, EVEROLIMUS PO, magnesium oxide, Biotin, Vitamin D3, and diphenoxylate-atropine.  Meds ordered this encounter  Medications  . Biotin 1 MG CAPS    Sig: Take 1 capsule by mouth daily.  . Cholecalciferol (VITAMIN D3) 2000 UNITS capsule    Sig: Take 1 capsule by mouth daily.  . diphenoxylate-atropine (LOMOTIL) 2.5-0.025 MG per tablet    Sig: Take 1 tablet by mouth 3 (three) times daily as needed.  . cholestyramine (QUESTRAN) 4 G packet    Sig: Take 1 packet (4 g total) by mouth 3 (three) times daily with meals.    Dispense:  60 each    Refill:  12  . ALPRAZolam (XANAX) 0.5 MG tablet    Sig: Take 1 tablet (0.5 mg total) by mouth at bedtime as needed for anxiety or sleep.    Dispense:  30 tablet    Refill:  5     Follow-up: Return in about 6 months (around 09/08/2015).  Claretta Fraise, M.D.

## 2015-03-10 DIAGNOSIS — I10 Essential (primary) hypertension: Secondary | ICD-10-CM | POA: Insufficient documentation

## 2015-03-11 LAB — CMP14+EGFR
ALBUMIN: 3.3 g/dL — AB (ref 3.6–4.8)
ALT: 17 IU/L (ref 0–32)
AST: 25 IU/L (ref 0–40)
Albumin/Globulin Ratio: 1.1 (ref 1.1–2.5)
Alkaline Phosphatase: 208 IU/L — ABNORMAL HIGH (ref 39–117)
BUN/Creatinine Ratio: 14 (ref 11–26)
BUN: 10 mg/dL (ref 8–27)
CALCIUM: 8.8 mg/dL (ref 8.7–10.3)
CO2: 22 mmol/L (ref 18–29)
Chloride: 105 mmol/L (ref 97–108)
Creatinine, Ser: 0.71 mg/dL (ref 0.57–1.00)
GFR calc Af Amer: 105 mL/min/{1.73_m2} (ref >59)
GFR, EST NON AFRICAN AMERICAN: 91 mL/min/{1.73_m2} (ref >59)
GLUCOSE: 89 mg/dL (ref 65–99)
Globulin, Total: 3 g/dL (ref 1.5–4.5)
Potassium: 5.4 mmol/L — ABNORMAL HIGH (ref 3.5–5.2)
SODIUM: 139 mmol/L (ref 134–144)
TOTAL PROTEIN: 6.3 g/dL (ref 6.0–8.5)

## 2015-03-11 LAB — PHOSPHORUS: Phosphorus: 3.8 mg/dL (ref 2.5–4.5)

## 2015-03-11 LAB — CBC WITH DIFFERENTIAL/PLATELET
BASOS ABS: 0.1 10*3/uL (ref 0.0–0.2)
BASOS: 2 %
EOS (ABSOLUTE): 0.3 10*3/uL (ref 0.0–0.4)
Eos: 5 %
HEMATOCRIT: 31.8 % — AB (ref 34.0–46.6)
HEMOGLOBIN: 9.8 g/dL — AB (ref 11.1–15.9)
IMMATURE GRANS (ABS): 0 10*3/uL (ref 0.0–0.1)
IMMATURE GRANULOCYTES: 0 %
LYMPHS ABS: 2.8 10*3/uL (ref 0.7–3.1)
LYMPHS: 40 %
MCH: 26.1 pg — ABNORMAL LOW (ref 26.6–33.0)
MCHC: 30.8 g/dL — ABNORMAL LOW (ref 31.5–35.7)
MCV: 85 fL (ref 79–97)
MONOCYTES: 13 %
Monocytes Absolute: 0.9 10*3/uL (ref 0.1–0.9)
Neutrophils Absolute: 2.8 10*3/uL (ref 1.4–7.0)
Neutrophils: 40 %
Platelets: 434 10*3/uL — ABNORMAL HIGH (ref 150–379)
RBC: 3.75 x10E6/uL — ABNORMAL LOW (ref 3.77–5.28)
RDW: 15.5 % — AB (ref 12.3–15.4)
WBC: 6.9 10*3/uL (ref 3.4–10.8)

## 2015-03-11 LAB — EVEROLIMUS: Everolimus: 4.1 ng/mL (ref 3.0–8.0)

## 2015-03-11 LAB — TSH: TSH: 3.79 u[IU]/mL (ref 0.450–4.500)

## 2015-03-11 LAB — MAGNESIUM: Magnesium: 2.1 mg/dL (ref 1.6–2.3)

## 2015-03-11 LAB — T4, FREE: Free T4: 1.03 ng/dL (ref 0.82–1.77)

## 2015-03-11 LAB — TACROLIMUS LEVEL: Tacrolimus Lvl: 3 ng/mL (ref 2.0–20.0)

## 2015-03-22 ENCOUNTER — Ambulatory Visit (INDEPENDENT_AMBULATORY_CARE_PROVIDER_SITE_OTHER): Payer: Medicare Other | Admitting: Internal Medicine

## 2015-04-04 ENCOUNTER — Other Ambulatory Visit (INDEPENDENT_AMBULATORY_CARE_PROVIDER_SITE_OTHER): Payer: Medicare Other

## 2015-04-04 DIAGNOSIS — Z944 Liver transplant status: Secondary | ICD-10-CM

## 2015-04-04 NOTE — Progress Notes (Signed)
Labs for dr. Durward Parcel cmp cbc magnesuim, phosphorus, tacrolimus level dx z94.4

## 2015-04-05 LAB — CBC WITH DIFFERENTIAL/PLATELET
BASOS: 1 %
Basophils Absolute: 0 10*3/uL (ref 0.0–0.2)
EOS (ABSOLUTE): 0.1 10*3/uL (ref 0.0–0.4)
EOS: 2 %
HEMOGLOBIN: 11.2 g/dL (ref 11.1–15.9)
Hematocrit: 36 % (ref 34.0–46.6)
Immature Grans (Abs): 0 10*3/uL (ref 0.0–0.1)
Immature Granulocytes: 0 %
Lymphocytes Absolute: 3.4 10*3/uL — ABNORMAL HIGH (ref 0.7–3.1)
Lymphs: 52 %
MCH: 26.9 pg (ref 26.6–33.0)
MCHC: 31.1 g/dL — ABNORMAL LOW (ref 31.5–35.7)
MCV: 86 fL (ref 79–97)
MONOCYTES: 11 %
Monocytes Absolute: 0.7 10*3/uL (ref 0.1–0.9)
NEUTROS ABS: 2.2 10*3/uL (ref 1.4–7.0)
Neutrophils: 34 %
Platelets: 456 10*3/uL — ABNORMAL HIGH (ref 150–379)
RBC: 4.17 x10E6/uL (ref 3.77–5.28)
RDW: 15.7 % — AB (ref 12.3–15.4)
WBC: 6.5 10*3/uL (ref 3.4–10.8)

## 2015-04-05 LAB — COMPREHENSIVE METABOLIC PANEL
A/G RATIO: 1.1 (ref 1.1–2.5)
ALBUMIN: 3.6 g/dL (ref 3.6–4.8)
ALT: 31 IU/L (ref 0–32)
AST: 38 IU/L (ref 0–40)
Alkaline Phosphatase: 352 IU/L — ABNORMAL HIGH (ref 39–117)
BUN / CREAT RATIO: 17 (ref 11–26)
BUN: 14 mg/dL (ref 8–27)
Bilirubin Total: <0.2 mg/dL (ref 0.0–1.2)
CO2: 18 mmol/L (ref 18–29)
CREATININE: 0.81 mg/dL (ref 0.57–1.00)
Calcium: 8.8 mg/dL (ref 8.7–10.3)
Chloride: 103 mmol/L (ref 97–108)
GFR calc Af Amer: 89 mL/min/{1.73_m2} (ref >59)
GFR calc non Af Amer: 78 mL/min/{1.73_m2} (ref >59)
GLOBULIN, TOTAL: 3.2 g/dL (ref 1.5–4.5)
Glucose: 87 mg/dL (ref 65–99)
Potassium: 5.1 mmol/L (ref 3.5–5.2)
Sodium: 139 mmol/L (ref 134–144)
Total Protein: 6.8 g/dL (ref 6.0–8.5)

## 2015-04-05 LAB — EVEROLIMUS: Everolimus: 7.5 ng/mL (ref 3.0–8.0)

## 2015-04-05 LAB — PHOSPHORUS: Phosphorus: 4.3 mg/dL (ref 2.5–4.5)

## 2015-04-05 LAB — MAGNESIUM: Magnesium: 1.6 mg/dL (ref 1.6–2.3)

## 2015-04-05 LAB — TACROLIMUS LEVEL: TACROLIMUS LVL: 8.4 ng/mL (ref 2.0–20.0)

## 2015-04-21 DIAGNOSIS — Z4689 Encounter for fitting and adjustment of other specified devices: Secondary | ICD-10-CM | POA: Diagnosis not present

## 2015-04-21 DIAGNOSIS — Z9049 Acquired absence of other specified parts of digestive tract: Secondary | ICD-10-CM | POA: Diagnosis not present

## 2015-04-21 DIAGNOSIS — Z4659 Encounter for fitting and adjustment of other gastrointestinal appliance and device: Secondary | ICD-10-CM | POA: Diagnosis not present

## 2015-04-21 DIAGNOSIS — K838 Other specified diseases of biliary tract: Secondary | ICD-10-CM | POA: Diagnosis not present

## 2015-04-21 DIAGNOSIS — K831 Obstruction of bile duct: Secondary | ICD-10-CM | POA: Diagnosis not present

## 2015-04-21 DIAGNOSIS — K8050 Calculus of bile duct without cholangitis or cholecystitis without obstruction: Secondary | ICD-10-CM | POA: Diagnosis not present

## 2015-04-21 DIAGNOSIS — Z944 Liver transplant status: Secondary | ICD-10-CM | POA: Diagnosis not present

## 2015-04-21 DIAGNOSIS — K743 Primary biliary cirrhosis: Secondary | ICD-10-CM | POA: Diagnosis not present

## 2015-05-09 ENCOUNTER — Other Ambulatory Visit (INDEPENDENT_AMBULATORY_CARE_PROVIDER_SITE_OTHER): Payer: Medicare Other

## 2015-05-09 DIAGNOSIS — Z944 Liver transplant status: Secondary | ICD-10-CM | POA: Diagnosis not present

## 2015-05-09 NOTE — Progress Notes (Signed)
Lab only 

## 2015-05-10 LAB — CBC WITH DIFFERENTIAL/PLATELET
Basophils Absolute: 0.2 10*3/uL (ref 0.0–0.2)
Basos: 2 %
EOS (ABSOLUTE): 0.3 10*3/uL (ref 0.0–0.4)
Eos: 4 %
Hematocrit: 31.1 % — ABNORMAL LOW (ref 34.0–46.6)
Hemoglobin: 9.6 g/dL — ABNORMAL LOW (ref 11.1–15.9)
Immature Grans (Abs): 0 10*3/uL (ref 0.0–0.1)
Immature Granulocytes: 0 %
Lymphocytes Absolute: 3.1 10*3/uL (ref 0.7–3.1)
Lymphs: 40 %
MCH: 25.9 pg — ABNORMAL LOW (ref 26.6–33.0)
MCHC: 30.9 g/dL — ABNORMAL LOW (ref 31.5–35.7)
MCV: 84 fL (ref 79–97)
Monocytes Absolute: 1.1 10*3/uL — ABNORMAL HIGH (ref 0.1–0.9)
Monocytes: 15 %
Neutrophils Absolute: 3 10*3/uL (ref 1.4–7.0)
Neutrophils: 39 %
Platelets: 460 10*3/uL — ABNORMAL HIGH (ref 150–379)
RBC: 3.71 x10E6/uL — ABNORMAL LOW (ref 3.77–5.28)
RDW: 15.6 % — ABNORMAL HIGH (ref 12.3–15.4)
WBC: 7.7 10*3/uL (ref 3.4–10.8)

## 2015-05-10 LAB — COMPREHENSIVE METABOLIC PANEL
ALBUMIN: 3.5 g/dL — AB (ref 3.6–4.8)
ALT: 18 IU/L (ref 0–32)
AST: 29 IU/L (ref 0–40)
Albumin/Globulin Ratio: 1.2 (ref 1.1–2.5)
Alkaline Phosphatase: 265 IU/L — ABNORMAL HIGH (ref 39–117)
BUN / CREAT RATIO: 14 (ref 11–26)
BUN: 10 mg/dL (ref 8–27)
Bilirubin Total: <0.2 mg/dL (ref 0.0–1.2)
CALCIUM: 8.4 mg/dL — AB (ref 8.7–10.3)
CO2: 21 mmol/L (ref 18–29)
CREATININE: 0.72 mg/dL (ref 0.57–1.00)
Chloride: 105 mmol/L (ref 97–108)
GFR calc Af Amer: 102 mL/min/{1.73_m2} (ref >59)
GFR, EST NON AFRICAN AMERICAN: 89 mL/min/{1.73_m2} (ref >59)
GLOBULIN, TOTAL: 3 g/dL (ref 1.5–4.5)
Glucose: 91 mg/dL (ref 65–99)
Potassium: 4.7 mmol/L (ref 3.5–5.2)
Sodium: 142 mmol/L (ref 134–144)
TOTAL PROTEIN: 6.5 g/dL (ref 6.0–8.5)

## 2015-05-10 LAB — EVEROLIMUS: Everolimus: 5.6 ng/mL (ref 3.0–8.0)

## 2015-05-10 LAB — TACROLIMUS LEVEL: TACROLIMUS LVL: 3 ng/mL (ref 2.0–20.0)

## 2015-05-10 LAB — PHOSPHORUS: PHOSPHORUS: 4 mg/dL (ref 2.5–4.5)

## 2015-05-10 LAB — MAGNESIUM: MAGNESIUM: 1.8 mg/dL (ref 1.6–2.3)

## 2015-05-18 DIAGNOSIS — C44329 Squamous cell carcinoma of skin of other parts of face: Secondary | ICD-10-CM | POA: Diagnosis not present

## 2015-05-18 DIAGNOSIS — C44320 Squamous cell carcinoma of skin of unspecified parts of face: Secondary | ICD-10-CM | POA: Diagnosis not present

## 2015-05-18 DIAGNOSIS — D0470 Carcinoma in situ of skin of unspecified lower limb, including hip: Secondary | ICD-10-CM | POA: Diagnosis not present

## 2015-05-18 DIAGNOSIS — C4442 Squamous cell carcinoma of skin of scalp and neck: Secondary | ICD-10-CM | POA: Diagnosis not present

## 2015-05-18 DIAGNOSIS — C44529 Squamous cell carcinoma of skin of other part of trunk: Secondary | ICD-10-CM | POA: Diagnosis not present

## 2015-05-18 DIAGNOSIS — D0472 Carcinoma in situ of skin of left lower limb, including hip: Secondary | ICD-10-CM | POA: Diagnosis not present

## 2015-05-18 DIAGNOSIS — C44629 Squamous cell carcinoma of skin of left upper limb, including shoulder: Secondary | ICD-10-CM | POA: Diagnosis not present

## 2015-05-31 ENCOUNTER — Encounter: Payer: Self-pay | Admitting: *Deleted

## 2015-06-06 ENCOUNTER — Other Ambulatory Visit (INDEPENDENT_AMBULATORY_CARE_PROVIDER_SITE_OTHER): Payer: Medicare Other

## 2015-06-06 DIAGNOSIS — Z944 Liver transplant status: Secondary | ICD-10-CM | POA: Diagnosis not present

## 2015-06-06 NOTE — Progress Notes (Signed)
Lab work for [facility] transplant center fax [phone]

## 2015-06-07 LAB — CBC WITH DIFFERENTIAL/PLATELET
Basophils Absolute: 0.1 10*3/uL (ref 0.0–0.2)
Basos: 2 %
EOS (ABSOLUTE): 0.3 10*3/uL (ref 0.0–0.4)
EOS: 4 %
Hematocrit: 32.7 % — ABNORMAL LOW (ref 34.0–46.6)
Hemoglobin: 10 g/dL — ABNORMAL LOW (ref 11.1–15.9)
Immature Grans (Abs): 0 10*3/uL (ref 0.0–0.1)
Immature Granulocytes: 0 %
Lymphocytes Absolute: 3.1 10*3/uL (ref 0.7–3.1)
Lymphs: 48 %
MCH: 26.3 pg — ABNORMAL LOW (ref 26.6–33.0)
MCHC: 30.6 g/dL — AB (ref 31.5–35.7)
MCV: 86 fL (ref 79–97)
MONOCYTES: 13 %
Monocytes Absolute: 0.9 10*3/uL (ref 0.1–0.9)
NEUTROS PCT: 33 %
Neutrophils Absolute: 2.1 10*3/uL (ref 1.4–7.0)
PLATELETS: 388 10*3/uL — AB (ref 150–379)
RBC: 3.8 x10E6/uL (ref 3.77–5.28)
RDW: 15.8 % — ABNORMAL HIGH (ref 12.3–15.4)
WBC: 6.5 10*3/uL (ref 3.4–10.8)

## 2015-06-07 LAB — CMP14+EGFR
A/G RATIO: 1.2 (ref 1.1–2.5)
ALK PHOS: 238 IU/L — AB (ref 39–117)
ALT: 18 IU/L (ref 0–32)
AST: 28 IU/L (ref 0–40)
Albumin: 3.4 g/dL — ABNORMAL LOW (ref 3.6–4.8)
BUN/Creatinine Ratio: 21 (ref 11–26)
BUN: 14 mg/dL (ref 8–27)
Bilirubin Total: <0.2 mg/dL (ref 0.0–1.2)
CHLORIDE: 104 mmol/L (ref 97–108)
CO2: 22 mmol/L (ref 18–29)
Calcium: 8.6 mg/dL — ABNORMAL LOW (ref 8.7–10.3)
Creatinine, Ser: 0.67 mg/dL (ref 0.57–1.00)
GFR calc non Af Amer: 93 mL/min/{1.73_m2} (ref >59)
GFR, EST AFRICAN AMERICAN: 107 mL/min/{1.73_m2} (ref >59)
GLUCOSE: 100 mg/dL — AB (ref 65–99)
Globulin, Total: 2.9 g/dL (ref 1.5–4.5)
Potassium: 4.8 mmol/L (ref 3.5–5.2)
SODIUM: 140 mmol/L (ref 134–144)
TOTAL PROTEIN: 6.3 g/dL (ref 6.0–8.5)

## 2015-06-07 LAB — EVEROLIMUS: EVEROLIMUS: 4.2 ng/mL (ref 3.0–8.0)

## 2015-06-07 LAB — MAGNESIUM: MAGNESIUM: 1.9 mg/dL (ref 1.6–2.3)

## 2015-06-07 LAB — TACROLIMUS LEVEL: Tacrolimus Lvl: 3.3 ng/mL (ref 2.0–20.0)

## 2015-06-07 LAB — PHOSPHORUS: PHOSPHORUS: 4.4 mg/dL (ref 2.5–4.5)

## 2015-06-09 DIAGNOSIS — C44320 Squamous cell carcinoma of skin of unspecified parts of face: Secondary | ICD-10-CM | POA: Diagnosis not present

## 2015-06-09 DIAGNOSIS — C44629 Squamous cell carcinoma of skin of left upper limb, including shoulder: Secondary | ICD-10-CM | POA: Diagnosis not present

## 2015-06-09 DIAGNOSIS — C4442 Squamous cell carcinoma of skin of scalp and neck: Secondary | ICD-10-CM | POA: Diagnosis not present

## 2015-06-14 NOTE — Patient Outreach (Signed)
[facility]) Care Management  06/14/2015  Cassidy Barber Mar 30, 1951 [id number]   Referral from Espino List, assigned [name], RN to outreach.  [name]. [place], [place] Management [place] Assistant Phone: [phone] Fax: [phone]

## 2015-06-23 DIAGNOSIS — Z944 Liver transplant status: Secondary | ICD-10-CM | POA: Diagnosis not present

## 2015-06-23 DIAGNOSIS — K529 Noninfective gastroenteritis and colitis, unspecified: Secondary | ICD-10-CM | POA: Diagnosis not present

## 2015-06-23 DIAGNOSIS — Z418 Encounter for other procedures for purposes other than remedying health state: Secondary | ICD-10-CM | POA: Diagnosis not present

## 2015-06-23 DIAGNOSIS — Z1509 Genetic susceptibility to other malignant neoplasm: Secondary | ICD-10-CM | POA: Diagnosis not present

## 2015-06-23 DIAGNOSIS — K591 Functional diarrhea: Secondary | ICD-10-CM | POA: Diagnosis not present

## 2015-06-23 DIAGNOSIS — Z48298 Encounter for aftercare following other organ transplant: Secondary | ICD-10-CM | POA: Diagnosis not present

## 2015-06-23 DIAGNOSIS — Z85038 Personal history of other malignant neoplasm of large intestine: Secondary | ICD-10-CM | POA: Diagnosis not present

## 2015-06-23 DIAGNOSIS — Z22322 Carrier or suspected carrier of Methicillin resistant Staphylococcus aureus: Secondary | ICD-10-CM | POA: Diagnosis not present

## 2015-06-30 ENCOUNTER — Encounter: Payer: Self-pay | Admitting: *Deleted

## 2015-06-30 ENCOUNTER — Other Ambulatory Visit: Payer: Self-pay | Admitting: *Deleted

## 2015-06-30 NOTE — Patient Outreach (Signed)
[facility]) Care Management  06/30/2015  Cassidy Barber 06-28-1951 [id number]   High Risk referral: Telephone call to patient who was advised of reason for call and of [facility] care management services. Patient states she has history of liver transplant and colon removal in 2015 and currently everything is under control.  States she follows up with specialists at [facility] where she had her surgery. States she has established with primary care doctor in [place], [place].  States getting medications without problems and taking as prescribed by her doctor. Voices understanding of importance of medication adherence.   Patient states she is independent but has support from her spouse as needed.  Patient voices she has no needs for case/disease management at this time but appreciates the call.  Plan: will close case; send MD closure letter.   [name], RN BSN CCM Care Management Coordinator [facility] Care Management  [phone]

## 2015-06-30 NOTE — Patient Outreach (Signed)
[facility]) Care Management  06/30/2015  Cassidy Barber 1951-09-07 [id number]   Notification received from [name], [facility] to close case due to consumer being assessed, no further intervention required.   [name] [name], [place] Care Management Assistant

## 2015-07-11 ENCOUNTER — Other Ambulatory Visit (INDEPENDENT_AMBULATORY_CARE_PROVIDER_SITE_OTHER): Payer: Medicare Other

## 2015-07-11 DIAGNOSIS — Z944 Liver transplant status: Secondary | ICD-10-CM

## 2015-07-12 LAB — CBC WITH DIFFERENTIAL/PLATELET
BASOS: 2 %
Basophils Absolute: 0.1 10*3/uL (ref 0.0–0.2)
EOS (ABSOLUTE): 0.2 10*3/uL (ref 0.0–0.4)
Eos: 4 %
Hematocrit: 32.9 % — ABNORMAL LOW (ref 34.0–46.6)
Hemoglobin: 10.3 g/dL — ABNORMAL LOW (ref 11.1–15.9)
Immature Grans (Abs): 0 10*3/uL (ref 0.0–0.1)
Immature Granulocytes: 0 %
Lymphocytes Absolute: 3.2 10*3/uL — ABNORMAL HIGH (ref 0.7–3.1)
Lymphs: 49 %
MCH: 26.8 pg (ref 26.6–33.0)
MCHC: 31.3 g/dL — ABNORMAL LOW (ref 31.5–35.7)
MCV: 86 fL (ref 79–97)
MONOS ABS: 1.1 10*3/uL — AB (ref 0.1–0.9)
Monocytes: 17 %
NEUTROS ABS: 1.8 10*3/uL (ref 1.4–7.0)
Neutrophils: 28 %
PLATELETS: 406 10*3/uL — AB (ref 150–379)
RBC: 3.85 x10E6/uL (ref 3.77–5.28)
RDW: 15.7 % — AB (ref 12.3–15.4)
WBC: 6.3 10*3/uL (ref 3.4–10.8)

## 2015-07-12 LAB — CMP14+EGFR
A/G RATIO: 1.1 (ref 1.1–2.5)
ALT: 16 IU/L (ref 0–32)
AST: 25 IU/L (ref 0–40)
Albumin: 3.3 g/dL — ABNORMAL LOW (ref 3.6–4.8)
Alkaline Phosphatase: 147 IU/L — ABNORMAL HIGH (ref 39–117)
BUN/Creatinine Ratio: 20 (ref 11–26)
BUN: 15 mg/dL (ref 8–27)
Bilirubin Total: <0.2 mg/dL (ref 0.0–1.2)
CO2: 21 mmol/L (ref 18–29)
Calcium: 8.6 mg/dL — ABNORMAL LOW (ref 8.7–10.3)
Chloride: 106 mmol/L (ref 97–108)
Creatinine, Ser: 0.75 mg/dL (ref 0.57–1.00)
GFR calc non Af Amer: 85 mL/min/{1.73_m2} (ref >59)
GFR, EST AFRICAN AMERICAN: 97 mL/min/{1.73_m2} (ref >59)
GLOBULIN, TOTAL: 2.9 g/dL (ref 1.5–4.5)
Glucose: 96 mg/dL (ref 65–99)
POTASSIUM: 5 mmol/L (ref 3.5–5.2)
SODIUM: 143 mmol/L (ref 134–144)
Total Protein: 6.2 g/dL (ref 6.0–8.5)

## 2015-07-12 LAB — PHOSPHORUS: Phosphorus: 4.1 mg/dL (ref 2.5–4.5)

## 2015-07-12 LAB — EVEROLIMUS: Everolimus: 4.6 ng/mL (ref 3.0–8.0)

## 2015-07-12 LAB — TACROLIMUS LEVEL: TACROLIMUS LVL: 3.1 ng/mL (ref 2.0–20.0)

## 2015-08-01 DIAGNOSIS — Z1231 Encounter for screening mammogram for malignant neoplasm of breast: Secondary | ICD-10-CM | POA: Diagnosis not present

## 2015-08-08 ENCOUNTER — Other Ambulatory Visit (INDEPENDENT_AMBULATORY_CARE_PROVIDER_SITE_OTHER): Payer: Medicare Other

## 2015-08-08 DIAGNOSIS — Z944 Liver transplant status: Secondary | ICD-10-CM

## 2015-08-08 NOTE — Progress Notes (Signed)
Labs for [facility] center

## 2015-08-09 DIAGNOSIS — D485 Neoplasm of uncertain behavior of skin: Secondary | ICD-10-CM | POA: Diagnosis not present

## 2015-08-09 DIAGNOSIS — D0462 Carcinoma in situ of skin of left upper limb, including shoulder: Secondary | ICD-10-CM | POA: Diagnosis not present

## 2015-08-09 DIAGNOSIS — L570 Actinic keratosis: Secondary | ICD-10-CM | POA: Diagnosis not present

## 2015-08-09 DIAGNOSIS — C4442 Squamous cell carcinoma of skin of scalp and neck: Secondary | ICD-10-CM | POA: Diagnosis not present

## 2015-08-09 DIAGNOSIS — D045 Carcinoma in situ of skin of trunk: Secondary | ICD-10-CM | POA: Diagnosis not present

## 2015-08-09 LAB — COMPREHENSIVE METABOLIC PANEL
A/G RATIO: 1.3 (ref 1.1–2.5)
ALT: 20 IU/L (ref 0–32)
AST: 25 IU/L (ref 0–40)
Albumin: 3.5 g/dL — ABNORMAL LOW (ref 3.6–4.8)
Alkaline Phosphatase: 186 IU/L — ABNORMAL HIGH (ref 39–117)
BUN/Creatinine Ratio: 18 (ref 11–26)
BUN: 13 mg/dL (ref 8–27)
CALCIUM: 8.3 mg/dL — AB (ref 8.7–10.3)
CHLORIDE: 108 mmol/L (ref 97–108)
CO2: 23 mmol/L (ref 18–29)
Creatinine, Ser: 0.73 mg/dL (ref 0.57–1.00)
GFR calc non Af Amer: 87 mL/min/{1.73_m2} (ref >59)
GFR, EST AFRICAN AMERICAN: 101 mL/min/{1.73_m2} (ref >59)
GLUCOSE: 95 mg/dL (ref 65–99)
Globulin, Total: 2.8 g/dL (ref 1.5–4.5)
POTASSIUM: 4.5 mmol/L (ref 3.5–5.2)
Sodium: 144 mmol/L (ref 134–144)
TOTAL PROTEIN: 6.3 g/dL (ref 6.0–8.5)

## 2015-08-09 LAB — CBC WITH DIFFERENTIAL/PLATELET
BASOS ABS: 0.1 10*3/uL (ref 0.0–0.2)
Basos: 2 %
EOS (ABSOLUTE): 0.3 10*3/uL (ref 0.0–0.4)
Eos: 5 %
Hematocrit: 33.5 % — ABNORMAL LOW (ref 34.0–46.6)
Hemoglobin: 10.5 g/dL — ABNORMAL LOW (ref 11.1–15.9)
IMMATURE GRANS (ABS): 0 10*3/uL (ref 0.0–0.1)
Immature Granulocytes: 0 %
LYMPHS: 43 %
Lymphocytes Absolute: 2.8 10*3/uL (ref 0.7–3.1)
MCH: 27.3 pg (ref 26.6–33.0)
MCHC: 31.3 g/dL — ABNORMAL LOW (ref 31.5–35.7)
MCV: 87 fL (ref 79–97)
MONOCYTES: 20 %
Monocytes Absolute: 1.3 10*3/uL — ABNORMAL HIGH (ref 0.1–0.9)
NEUTROS PCT: 30 %
Neutrophils Absolute: 1.9 10*3/uL (ref 1.4–7.0)
PLATELETS: 407 10*3/uL — AB (ref 150–379)
RBC: 3.84 x10E6/uL (ref 3.77–5.28)
RDW: 15 % (ref 12.3–15.4)
WBC: 6.3 10*3/uL (ref 3.4–10.8)

## 2015-08-09 LAB — TACROLIMUS LEVEL: TACROLIMUS LVL: 3.1 ng/mL (ref 2.0–20.0)

## 2015-08-09 LAB — EVEROLIMUS: Everolimus: 4.6 ng/mL (ref 3.0–8.0)

## 2015-08-09 LAB — PHOSPHORUS: PHOSPHORUS: 4.1 mg/dL (ref 2.5–4.5)

## 2015-08-09 LAB — MAGNESIUM: Magnesium: 2 mg/dL (ref 1.6–2.3)

## 2015-08-24 ENCOUNTER — Telehealth: Payer: Self-pay | Admitting: Family Medicine

## 2015-08-24 NOTE — Telephone Encounter (Signed)
Patient states that she received letter today about mammogram.

## 2015-08-31 DIAGNOSIS — Z029 Encounter for administrative examinations, unspecified: Secondary | ICD-10-CM | POA: Diagnosis not present

## 2015-09-01 DIAGNOSIS — Z800 Family history of malignant neoplasm of digestive organs: Secondary | ICD-10-CM | POA: Diagnosis not present

## 2015-09-01 DIAGNOSIS — I10 Essential (primary) hypertension: Secondary | ICD-10-CM | POA: Diagnosis not present

## 2015-09-01 DIAGNOSIS — Z7982 Long term (current) use of aspirin: Secondary | ICD-10-CM | POA: Diagnosis not present

## 2015-09-01 DIAGNOSIS — I472 Ventricular tachycardia: Secondary | ICD-10-CM | POA: Diagnosis not present

## 2015-09-01 DIAGNOSIS — Z4823 Encounter for aftercare following liver transplant: Secondary | ICD-10-CM | POA: Diagnosis not present

## 2015-09-01 DIAGNOSIS — Z8669 Personal history of other diseases of the nervous system and sense organs: Secondary | ICD-10-CM | POA: Diagnosis not present

## 2015-09-01 DIAGNOSIS — K743 Primary biliary cirrhosis: Secondary | ICD-10-CM | POA: Diagnosis not present

## 2015-09-01 DIAGNOSIS — E039 Hypothyroidism, unspecified: Secondary | ICD-10-CM | POA: Diagnosis not present

## 2015-09-01 DIAGNOSIS — Z944 Liver transplant status: Secondary | ICD-10-CM | POA: Diagnosis not present

## 2015-09-01 DIAGNOSIS — K831 Obstruction of bile duct: Secondary | ICD-10-CM | POA: Diagnosis not present

## 2015-09-01 DIAGNOSIS — Z9049 Acquired absence of other specified parts of digestive tract: Secondary | ICD-10-CM | POA: Diagnosis not present

## 2015-09-01 DIAGNOSIS — Z79899 Other long term (current) drug therapy: Secondary | ICD-10-CM | POA: Diagnosis not present

## 2015-09-01 DIAGNOSIS — G40909 Epilepsy, unspecified, not intractable, without status epilepticus: Secondary | ICD-10-CM | POA: Diagnosis not present

## 2015-09-01 DIAGNOSIS — Z23 Encounter for immunization: Secondary | ICD-10-CM | POA: Diagnosis not present

## 2015-09-01 DIAGNOSIS — Z418 Encounter for other procedures for purposes other than remedying health state: Secondary | ICD-10-CM | POA: Diagnosis not present

## 2015-09-01 DIAGNOSIS — Z1509 Genetic susceptibility to other malignant neoplasm: Secondary | ICD-10-CM | POA: Diagnosis not present

## 2015-09-01 DIAGNOSIS — K9189 Other postprocedural complications and disorders of digestive system: Secondary | ICD-10-CM | POA: Diagnosis not present

## 2015-09-05 ENCOUNTER — Encounter: Payer: Self-pay | Admitting: Family Medicine

## 2015-09-08 DIAGNOSIS — Z029 Encounter for administrative examinations, unspecified: Secondary | ICD-10-CM | POA: Diagnosis not present

## 2015-09-12 ENCOUNTER — Other Ambulatory Visit (INDEPENDENT_AMBULATORY_CARE_PROVIDER_SITE_OTHER): Payer: Medicare Other

## 2015-09-12 DIAGNOSIS — Z944 Liver transplant status: Secondary | ICD-10-CM

## 2015-09-12 NOTE — Progress Notes (Signed)
Lab work for Dr Vicente Serene Argo CMP, CBC, MAGNESIUM, PHOSPHORUS, PROGRAF, EVERLIMUS Z94.4

## 2015-09-13 ENCOUNTER — Encounter (INDEPENDENT_AMBULATORY_CARE_PROVIDER_SITE_OTHER): Payer: Self-pay | Admitting: Internal Medicine

## 2015-09-13 ENCOUNTER — Ambulatory Visit (INDEPENDENT_AMBULATORY_CARE_PROVIDER_SITE_OTHER): Payer: Medicare Other | Admitting: Internal Medicine

## 2015-09-13 ENCOUNTER — Encounter (INDEPENDENT_AMBULATORY_CARE_PROVIDER_SITE_OTHER): Payer: Self-pay | Admitting: *Deleted

## 2015-09-13 ENCOUNTER — Other Ambulatory Visit (INDEPENDENT_AMBULATORY_CARE_PROVIDER_SITE_OTHER): Payer: Self-pay | Admitting: Internal Medicine

## 2015-09-13 VITALS — BP 104/78 | HR 70 | Temp 97.8°F | Resp 18 | Ht 66.0 in | Wt 144.0 lb

## 2015-09-13 DIAGNOSIS — Z1509 Genetic susceptibility to other malignant neoplasm: Secondary | ICD-10-CM

## 2015-09-13 DIAGNOSIS — Z85038 Personal history of other malignant neoplasm of large intestine: Secondary | ICD-10-CM

## 2015-09-13 DIAGNOSIS — K529 Noninfective gastroenteritis and colitis, unspecified: Secondary | ICD-10-CM | POA: Diagnosis not present

## 2015-09-13 DIAGNOSIS — R197 Diarrhea, unspecified: Secondary | ICD-10-CM
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
## 2015-09-13 NOTE — Patient Instructions (Signed)
Flexible sigmoidoscopy to be scheduled. Take diphenoxylate 1 tablet by mouth 30 minutes before each meal and at bedtime. Take Librax or clindex 1 capsule by mouth 30 minutes before each meal. Keep stool diary until office visit in 2 months(frequency and consistency of stools).

## 2015-09-14 DIAGNOSIS — C4442 Squamous cell carcinoma of skin of scalp and neck: Secondary | ICD-10-CM | POA: Diagnosis not present

## 2015-09-15 LAB — CMP14+EGFR
ALK PHOS: 288 IU/L — AB (ref 39–117)
ALT: 30 IU/L (ref 0–32)
AST: 38 IU/L (ref 0–40)
Albumin/Globulin Ratio: 1.3 (ref 1.1–2.5)
Albumin: 3.5 g/dL — ABNORMAL LOW (ref 3.6–4.8)
BUN/Creatinine Ratio: 21 (ref 11–26)
BUN: 18 mg/dL (ref 8–27)
CHLORIDE: 105 mmol/L (ref 97–106)
CO2: 23 mmol/L (ref 18–29)
Calcium: 8.4 mg/dL — ABNORMAL LOW (ref 8.7–10.3)
Creatinine, Ser: 0.86 mg/dL (ref 0.57–1.00)
GFR calc Af Amer: 83 mL/min/{1.73_m2} (ref >59)
GFR calc non Af Amer: 72 mL/min/{1.73_m2} (ref >59)
GLUCOSE: 94 mg/dL (ref 65–99)
Globulin, Total: 2.8 g/dL (ref 1.5–4.5)
Potassium: 4.5 mmol/L (ref 3.5–5.2)
Sodium: 142 mmol/L (ref 136–144)
Total Protein: 6.3 g/dL (ref 6.0–8.5)

## 2015-09-15 LAB — CBC WITH DIFFERENTIAL/PLATELET
BASOS ABS: 0.2 10*3/uL (ref 0.0–0.2)
Basos: 3 %
EOS (ABSOLUTE): 0.3 10*3/uL (ref 0.0–0.4)
Eos: 4 %
HEMOGLOBIN: 10.2 g/dL — AB (ref 11.1–15.9)
Hematocrit: 33.1 % — ABNORMAL LOW (ref 34.0–46.6)
IMMATURE GRANS (ABS): 0 10*3/uL (ref 0.0–0.1)
Immature Granulocytes: 0 %
LYMPHS ABS: 3 10*3/uL (ref 0.7–3.1)
LYMPHS: 39 %
MCH: 26.4 pg — ABNORMAL LOW (ref 26.6–33.0)
MCHC: 30.8 g/dL — AB (ref 31.5–35.7)
MCV: 86 fL (ref 79–97)
MONOCYTES: 15 %
Monocytes Absolute: 1.1 10*3/uL — ABNORMAL HIGH (ref 0.1–0.9)
Neutrophils Absolute: 2.9 10*3/uL (ref 1.4–7.0)
Neutrophils: 39 %
PLATELETS: 406 10*3/uL — AB (ref 150–379)
RBC: 3.86 x10E6/uL (ref 3.77–5.28)
RDW: 14.9 % (ref 12.3–15.4)
WBC: 7.5 10*3/uL (ref 3.4–10.8)

## 2015-09-15 LAB — MAGNESIUM: Magnesium: 1.9 mg/dL (ref 1.6–2.3)

## 2015-09-15 LAB — TACROLIMUS LEVEL: TACROLIMUS LVL: 4.6 ng/mL (ref 2.0–20.0)

## 2015-09-15 LAB — EVEROLIMUS: Everolimus: 6.4 ng/mL (ref 3.0–8.0)

## 2015-09-15 LAB — PHOSPHORUS: PHOSPHORUS: 4.5 mg/dL (ref 2.5–4.5)

## 2015-09-23 ENCOUNTER — Ambulatory Visit: Payer: Medicare Other | Admitting: Family Medicine

## 2015-09-27 ENCOUNTER — Encounter: Payer: Self-pay | Admitting: Family Medicine

## 2015-09-27 ENCOUNTER — Ambulatory Visit (INDEPENDENT_AMBULATORY_CARE_PROVIDER_SITE_OTHER): Payer: Medicare Other | Admitting: Family Medicine

## 2015-09-27 VITALS — BP 122/72 | HR 72 | Temp 97.2°F | Ht 66.0 in | Wt 148.8 lb

## 2015-09-27 DIAGNOSIS — I10 Essential (primary) hypertension: Secondary | ICD-10-CM | POA: Diagnosis not present

## 2015-09-27 DIAGNOSIS — R569 Unspecified convulsions: Secondary | ICD-10-CM

## 2015-09-27 DIAGNOSIS — K591 Functional diarrhea: Secondary | ICD-10-CM

## 2015-09-27 DIAGNOSIS — E039 Hypothyroidism, unspecified: Secondary | ICD-10-CM

## 2015-09-27 NOTE — Progress Notes (Signed)
Subjective:  Patient ID: Cassidy Barber, female    DOB: 1950-11-17  Age: 64 y.o. MRN: [id number]  CC: Seizures   HPI Cassidy Barber presents for had one seizure in the remote past one day after transplant. However she has had no seizures since that time. She would like to discontinue the antiseizure drugs. She is taking immunosuppressants and would like to simplify her regimen. She denies any specific side effects from the [place].Patient presents for follow-up on  thyroid. She has a history of hypothyroidism for many years. It has been stable recently. Pt. denies any change in  voice, loss of hair, heat or cold intolerance. Energy level has been adequate to good. She denies constipation and diarrhea. No myxedema. Medication is as noted below. Verified that pt is taking it daily on an empty stomach. Well tolerated. She is due to have follow-up blood work on her immune suppressants on November 28. She is agreeable to having her TSH and free T4 drawn at that time. Other blood work recently performed was reviewed and is attached. For insurance purposes she is having to change her transplant care from [facility] in [place] to [place], in [place]. She wants to make sure that referrals are in order. Additionally she wants to be sure she is connected with GI and with neurology.   History Cassidy Barber has a past medical history of Liver disease; Hernia of unspecified site of abdominal cavity without mention of obstruction or gangrene; Thyroid disease; Hypertension; Primary biliary cirrhosis ([place]) (10/26/2013); and Anemia of chronic disease (10/26/2013).   She has past surgical history that includes Abdominal hernia repair; Splenectomy (2006); Cholecystectomy (2007); Colonoscopy; Upper gastrointestinal endoscopy; Colon surgery (2008); Hernia repair; Liver transplant ([id number]); Colon resection; and Abdominal hysterectomy.   Her family history includes Colon cancer in her sister; Healthy in her son; Prostate cancer in her  father.She reports that she has never smoked. She has never used smokeless tobacco. She reports that she does not drink alcohol or use illicit drugs.  Outpatient Prescriptions Prior to Visit  Medication Sig Dispense Refill  . acetaminophen (TYLENOL) 325 MG tablet Take 650 mg by mouth every 6 (six) hours as needed for mild pain or moderate pain.     [name] ALPRAZolam (XANAX) 0.5 MG tablet Take 1 tablet (0.5 mg total) by mouth at bedtime as needed for anxiety or sleep. 30 tablet 5  . aspirin 325 MG tablet Take 325 mg by mouth daily.    . Biotin 1 MG CAPS Take 1 capsule by mouth daily.    . Cholecalciferol (VITAMIN D3) 2000 UNITS capsule Take 1 capsule by mouth daily.    . cholestyramine (QUESTRAN) 4 G packet Take 1 packet (4 g total) by mouth 3 (three) times daily with meals. 60 each 12  . clidinium-chlordiazePOXIDE (LIBRAX) 5-2.5 MG capsule Take 1 capsule by mouth 3 (three) times daily before meals.    . diphenoxylate-atropine (LOMOTIL) 2.5-0.025 MG per tablet Take 1 tablet by mouth 4 (four) times daily.    [name] EVEROLIMUS PO Take 5 mg by mouth daily.  Take 3 mg in the AM and 2 mg in the PM    . ferrous sulfate 325 (65 FE) MG tablet Take 325 mg by mouth 2 (two) times daily with a meal.    . hydrOXYzine (VISTARIL) 25 MG capsule Take 25 mg by mouth 3 (three) times daily.    [name] levETIRAcetam (KEPPRA) 500 MG tablet Take 500 mg by mouth 2 (two) times daily.    [name]  levothyroxine (SYNTHROID, LEVOTHROID) 150 MCG tablet Take 150 mcg by mouth daily.    [name] losartan (COZAAR) 25 MG tablet Take 25 mg by mouth daily.    . magnesium oxide (MAG-OX) 400 MG tablet Take 1 tablet by mouth daily.    . metoprolol succinate (TOPROL-XL) 25 MG 24 hr tablet Take 25 mg by mouth daily.    . mupirocin ointment (BACTROBAN) 2 % Apply 1 application topically as needed.   1  . pantoprazole (PROTONIX) 40 MG tablet Take 40 mg by mouth daily.    . potassium chloride SA (K-DUR,KLOR-CON) 20 MEQ tablet Take 20 mEq by mouth daily.    . tacrolimus  (PROGRAF) 1 MG capsule Take 2 mg by mouth 2 (two) times daily.    . ursodiol (ACTIGALL) 300 MG capsule Take 300 mg by mouth 2 (two) times daily.     [name] zolpidem (AMBIEN) 5 MG tablet Take 5 mg by mouth at bedtime as needed for sleep.    . ciprofloxacin (CIPRO) 250 MG tablet Take 250 mg by mouth 2 (two) times daily.   0   No facility-administered medications prior to visit.    ROS Review of Systems  Constitutional: Negative for fever, chills, diaphoresis, appetite change, fatigue and unexpected weight change.  HENT: Negative for congestion, ear pain, hearing loss, postnasal drip, rhinorrhea, sneezing, sore throat and trouble swallowing.   Eyes: Negative for pain.  Respiratory: Negative for cough, chest tightness and shortness of breath.   Cardiovascular: Negative for chest pain and palpitations.  Gastrointestinal: Positive for diarrhea (sx daily in spite of meds noted below.). Negative for nausea, vomiting, abdominal pain and constipation.  Genitourinary: Negative for dysuria, frequency and menstrual problem.  Musculoskeletal: Negative for joint swelling and arthralgias.  Skin: Negative for rash.  Neurological: Negative for dizziness, weakness, numbness and headaches.  Psychiatric/Behavioral: Negative for dysphoric mood and agitation.    Objective:  BP 122/72 mmHg  Pulse 72  Temp(Src) 97.2 F (36.2 C) (Oral)  Ht 5\' 6"  (1.676 m)  Wt 148 lb 12.8 oz (67.495 kg)  BMI 24.03 kg/m2  SpO2 99%  BP Readings from Last 3 Encounters:  09/27/15 122/72  09/13/15 104/78  03/09/15 117/76    Wt Readings from Last 3 Encounters:  09/27/15 148 lb 12.8 oz (67.495 kg)  09/13/15 144 lb (65.318 kg)  03/09/15 135 lb 12.8 oz (61.598 kg)     Physical Exam  Constitutional: She is oriented to person, place, and time. She appears well-developed and well-nourished. No distress.  HENT:  Head: Normocephalic and atraumatic.  Right Ear: External ear normal.  Left Ear: External ear normal.  Nose: Nose  normal.  Mouth/Throat: Oropharynx is clear and moist.  Eyes: Conjunctivae and EOM are normal. Pupils are equal, round, and reactive to light.  Neck: Normal range of motion. Neck supple. No thyromegaly present.  Cardiovascular: Normal rate, regular rhythm and normal heart sounds.   No murmur heard. Pulmonary/Chest: Effort normal and breath sounds normal. No respiratory distress. She has no wheezes. She has no rales.  Abdominal: Soft. Bowel sounds are normal. She exhibits no distension. There is no tenderness.  Lymphadenopathy:    She has no cervical adenopathy.  Neurological: She is alert and oriented to person, place, and time. She has normal reflexes.  Skin: Skin is warm and dry.  Psychiatric: She has a normal mood and affect. Her behavior is normal. Judgment and thought content normal.    No results found for: HGBA1C  Lab Results  Component Value  Date   WBC 7.5 09/12/2015   HGB 9.9* 02/21/2015   HCT 33.1* 09/12/2015   PLT 430* 02/21/2015   GLUCOSE 94 09/12/2015   CHOL 156 07/05/2014   TRIG 116 07/05/2014   HDL 60 07/05/2014   LDLCALC 73 07/05/2014   ALT 30 09/12/2015   AST 38 09/12/2015   NA 142 09/12/2015   K 4.5 09/12/2015   CL 105 09/12/2015   CREATININE 0.86 09/12/2015   BUN 18 09/12/2015   CO2 23 09/12/2015   TSH 3.790 03/09/2015   INR 1.1 06/07/2014    Ct Abdomen Pelvis W Contrast  10/04/2014  CLINICAL DATA:  Pain all over abdomen. History of cirrhosis and liver transplant. Hysterectomy 6 weeks ago. EXAM: CT ABDOMEN AND PELVIS WITH CONTRAST TECHNIQUE: Multidetector CT imaging of the abdomen and pelvis was performed using the standard protocol following bolus administration of intravenous contrast. CONTRAST:  7mL OMNIPAQUE IOHEXOL 300 MG/ML SOLN, 156mL OMNIPAQUE IOHEXOL 300 MG/ML SOLN COMPARISON:  09/24/2014 FINDINGS: Again noted is a small-to-moderate sized left pleural effusion with compressive atelectasis in left lower lobe. Tiny right pleural effusion. Again  noted are skin staples along the anterior abdomen. There continues to be a subcutaneous fluid collection anterior to the abdominal wall in the lower abdomen. This collection now contains less gas. Collection roughly measures 13.2 x 2.3 cm and previously measured 9.2 x 1.8 cm at a similar level. Postsurgical changes compatible with a liver transplantation. There is residual fluid in the right upper abdomen adjacent to the liver which has decreased in size. Gallbladder and spleen are surgically absent. A large splenic remnant or splenule is stable. No acute abnormality involving the pancreas or kidneys. Probable small left renal cysts without hydronephrosis. Adrenal glands are difficult to evaluate. Mild intrahepatic biliary dilatation which is unchanged. Portal venous system is patent. Again noted are prominent venous structures in the upper abdomen. Uterus is absent. Fluid in the pelvis has resolved. Urinary bladder is moderately distended. Anastomotic surgical clips in the distal colon. Distal colon contains a large amount of stool. Evidence for partial colectomy. There are dilated loops of small bowel, particularly in the left abdomen. Again noted is mild mesenteric edema. Decreased abdominal ascites. Grade 2 anterolisthesis at L5-S1 due to bilateral pars defects at L5. No acute bone abnormality. IMPRESSION: Subcutaneous fluid collection in the anterior lower abdomen has mildly enlarged in size. There is decreased gas within this collection. Findings are most compatible with a postoperative fluid collection, such as a seroma. There is a small amount of residual fluid around the liver and in the right upper quadrant. However, markedly decreased abdominal ascites compared to the previous examination. There continues to be some mesenteric edema. Dilated loops of small bowel with post surgical changes. There is also a large amount of stool in the remaining colon. Dilated loops of small bowel could be related to  constipation but cannot exclude a partial small bowel obstruction. Residual pleural effusions, left side greater than right. Postsurgical changes compatible with liver transplantation. Electronically Signed   By: Markus Daft M.D.   On: 10/04/2014 10:55    Assessment & Plan:   Cassidy Barber was seen today for seizures.  Diagnoses and all orders for this visit:  Hypothyroidism, unspecified hypothyroidism type -     TSH -     T4, Free  Essential hypertension, benign  Functional diarrhea  Seizures ([place]) -     EEG; Future  Other orders -     Cancel: Ambulatory referral to Neurology  I have discontinued Cassidy Barber ciprofloxacin. I am also having her maintain her levothyroxine, ursodiol, acetaminophen, aspirin, levETIRAcetam, losartan, metoprolol succinate, pantoprazole, potassium chloride SA, hydrOXYzine, ferrous sulfate, tacrolimus, zolpidem, EVEROLIMUS PO, magnesium oxide, Biotin, Vitamin D3, diphenoxylate-atropine, cholestyramine, ALPRAZolam, mupirocin ointment, and clidinium-chlordiazePOXIDE.  No orders of the defined types were placed in this encounter.    She is to have an EEG done. If that is unremarkable she will taper off of the Keppra by one half pill daily each 2 weeks. At the end of 6 weeks she will have a repeat EEG. If that is negative she will stay off the medicine and resume driving.   Follow-up: Return in about 6 months (around 03/26/2016) for Hypothyroidism, hypertension.  Claretta Fraise, M.D.

## 2015-09-27 NOTE — Patient Instructions (Signed)
Check with your GI doctor about using Xifaxan  to prevent diarrhea.

## 2015-09-29 DIAGNOSIS — L570 Actinic keratosis: Secondary | ICD-10-CM | POA: Diagnosis not present

## 2015-09-29 DIAGNOSIS — L905 Scar conditions and fibrosis of skin: Secondary | ICD-10-CM | POA: Diagnosis not present

## 2015-09-29 DIAGNOSIS — C44529 Squamous cell carcinoma of skin of other part of trunk: Secondary | ICD-10-CM | POA: Diagnosis not present

## 2015-10-05 ENCOUNTER — Telehealth: Payer: Self-pay | Admitting: Family Medicine

## 2015-10-05 NOTE — Telephone Encounter (Signed)
Please review and advise.

## 2015-10-10 ENCOUNTER — Other Ambulatory Visit: Payer: Medicare Other

## 2015-10-10 ENCOUNTER — Other Ambulatory Visit: Payer: Self-pay

## 2015-10-10 DIAGNOSIS — M5432 Sciatica, left side: Secondary | ICD-10-CM

## 2015-10-10 DIAGNOSIS — E039 Hypothyroidism, unspecified: Secondary | ICD-10-CM

## 2015-10-10 DIAGNOSIS — G8929 Other chronic pain: Secondary | ICD-10-CM | POA: Diagnosis not present

## 2015-10-10 DIAGNOSIS — R569 Unspecified convulsions: Secondary | ICD-10-CM

## 2015-10-10 DIAGNOSIS — Z944 Liver transplant status: Secondary | ICD-10-CM

## 2015-10-10 DIAGNOSIS — R1011 Right upper quadrant pain: Secondary | ICD-10-CM

## 2015-10-10 DIAGNOSIS — R1010 Upper abdominal pain, unspecified: Secondary | ICD-10-CM | POA: Diagnosis not present

## 2015-10-10 NOTE — Telephone Encounter (Signed)
Please review my note from earlier this afternoon regarding my recommendation for tapering the medication levetiracetam (Keppra)

## 2015-10-10 NOTE — Telephone Encounter (Signed)
Referral was put back in as Neurology since as just EEG I could not see it until patient told me she was suppose to be referred    Please address question of medication

## 2015-10-10 NOTE — Telephone Encounter (Signed)
Don't start weaning off of the seizure medicine until after the first EEG is done. Then follow the schedule we discussed over 6 weeks. Then a second EEG will be done to determine if you can stay off of the medicine. If either EEG is positive he will need to continue the medication.

## 2015-10-11 LAB — CBC WITH DIFFERENTIAL/PLATELET
BASOS: 2 %
Basophils Absolute: 0.2 10*3/uL (ref 0.0–0.2)
EOS (ABSOLUTE): 0.4 10*3/uL (ref 0.0–0.4)
EOS: 5 %
HEMATOCRIT: 31.5 % — AB (ref 34.0–46.6)
HEMOGLOBIN: 9.8 g/dL — AB (ref 11.1–15.9)
IMMATURE GRANS (ABS): 0 10*3/uL (ref 0.0–0.1)
IMMATURE GRANULOCYTES: 0 %
LYMPHS: 43 %
Lymphocytes Absolute: 3.4 10*3/uL — ABNORMAL HIGH (ref 0.7–3.1)
MCH: 26.5 pg — ABNORMAL LOW (ref 26.6–33.0)
MCHC: 31.1 g/dL — ABNORMAL LOW (ref 31.5–35.7)
MCV: 85 fL (ref 79–97)
Monocytes Absolute: 1 10*3/uL — ABNORMAL HIGH (ref 0.1–0.9)
Monocytes: 14 %
NEUTROS PCT: 36 %
Neutrophils Absolute: 2.7 10*3/uL (ref 1.4–7.0)
Platelets: 400 10*3/uL — ABNORMAL HIGH (ref 150–379)
RBC: 3.7 x10E6/uL — ABNORMAL LOW (ref 3.77–5.28)
RDW: 15.1 % (ref 12.3–15.4)
WBC: 7.7 10*3/uL (ref 3.4–10.8)

## 2015-10-11 LAB — THYROID PANEL WITH TSH
Free Thyroxine Index: 2.4 (ref 1.2–4.9)
T3 UPTAKE RATIO: 29 % (ref 24–39)
T4, Total: 8.2 ug/dL (ref 4.5–12.0)
TSH: 3.75 u[IU]/mL (ref 0.450–4.500)

## 2015-10-11 LAB — COMPREHENSIVE METABOLIC PANEL
ALBUMIN: 3.2 g/dL — AB (ref 3.6–4.8)
ALK PHOS: 172 IU/L — AB (ref 39–117)
ALT: 15 IU/L (ref 0–32)
AST: 23 IU/L (ref 0–40)
Albumin/Globulin Ratio: 1.1 (ref 1.1–2.5)
BUN / CREAT RATIO: 16 (ref 11–26)
BUN: 12 mg/dL (ref 8–27)
Bilirubin Total: <0.2 mg/dL (ref 0.0–1.2)
CO2: 21 mmol/L (ref 18–29)
CREATININE: 0.76 mg/dL (ref 0.57–1.00)
Calcium: 8.4 mg/dL — ABNORMAL LOW (ref 8.7–10.3)
Chloride: 104 mmol/L (ref 97–106)
GFR calc non Af Amer: 83 mL/min/{1.73_m2} (ref >59)
GFR, EST AFRICAN AMERICAN: 96 mL/min/{1.73_m2} (ref >59)
GLUCOSE: 92 mg/dL (ref 65–99)
Globulin, Total: 2.9 g/dL (ref 1.5–4.5)
Potassium: 4.9 mmol/L (ref 3.5–5.2)
Sodium: 137 mmol/L (ref 136–144)
TOTAL PROTEIN: 6.1 g/dL (ref 6.0–8.5)

## 2015-10-11 LAB — TACROLIMUS LEVEL: TACROLIMUS LVL: 4.5 ng/mL (ref 2.0–20.0)

## 2015-10-11 LAB — MAGNESIUM: Magnesium: 1.8 mg/dL (ref 1.6–2.3)

## 2015-10-11 LAB — PHOSPHORUS: PHOSPHORUS: 3.7 mg/dL (ref 2.5–4.5)

## 2015-10-11 LAB — EVEROLIMUS: EVEROLIMUS: 5.8 ng/mL (ref 3.0–8.0)

## 2015-10-12 NOTE — Telephone Encounter (Signed)
Cassidy Barber, pt does not want to go to [place], [place] or [place]

## 2015-10-13 DIAGNOSIS — C44629 Squamous cell carcinoma of skin of left upper limb, including shoulder: Secondary | ICD-10-CM | POA: Diagnosis not present

## 2015-10-20 ENCOUNTER — Ambulatory Visit (HOSPITAL_COMMUNITY)
Admission: RE | Admit: 2015-10-20 | Discharge: 2015-10-20 | Disposition: A | Discharge status: Home / Self Care | Payer: Medicare Other | Source: Ambulatory Visit | Attending: Internal Medicine | Admitting: Internal Medicine

## 2015-10-20 ENCOUNTER — Encounter (HOSPITAL_COMMUNITY): Payer: Self-pay | Admitting: *Deleted

## 2015-10-20 ENCOUNTER — Encounter (HOSPITAL_COMMUNITY)
Admission: RE | Disposition: A | Discharge status: Home / Self Care | Payer: Self-pay | Source: Ambulatory Visit | Attending: Internal Medicine

## 2015-10-20 DIAGNOSIS — E079 Disorder of thyroid, unspecified: Secondary | ICD-10-CM | POA: Diagnosis not present

## 2015-10-20 DIAGNOSIS — Z8042 Family history of malignant neoplasm of prostate: Secondary | ICD-10-CM | POA: Insufficient documentation

## 2015-10-20 DIAGNOSIS — Z86010 Personal history of colonic polyps: Secondary | ICD-10-CM | POA: Diagnosis not present

## 2015-10-20 DIAGNOSIS — R197 Diarrhea, unspecified: Secondary | ICD-10-CM | POA: Insufficient documentation

## 2015-10-20 DIAGNOSIS — Z1509 Genetic susceptibility to other malignant neoplasm: Secondary | ICD-10-CM | POA: Insufficient documentation

## 2015-10-20 DIAGNOSIS — Z9049 Acquired absence of other specified parts of digestive tract: Secondary | ICD-10-CM | POA: Diagnosis not present

## 2015-10-20 DIAGNOSIS — Z800 Family history of malignant neoplasm of digestive organs: Secondary | ICD-10-CM | POA: Diagnosis not present

## 2015-10-20 DIAGNOSIS — Z85038 Personal history of other malignant neoplasm of large intestine: Secondary | ICD-10-CM | POA: Insufficient documentation

## 2015-10-20 DIAGNOSIS — I10 Essential (primary) hypertension: Secondary | ICD-10-CM | POA: Diagnosis not present

## 2015-10-20 DIAGNOSIS — Z7982 Long term (current) use of aspirin: Secondary | ICD-10-CM | POA: Diagnosis not present

## 2015-10-20 DIAGNOSIS — K644 Residual hemorrhoidal skin tags: Secondary | ICD-10-CM | POA: Insufficient documentation

## 2015-10-20 DIAGNOSIS — Z79899 Other long term (current) drug therapy: Secondary | ICD-10-CM | POA: Insufficient documentation

## 2015-10-20 DIAGNOSIS — K648 Other hemorrhoids: Secondary | ICD-10-CM | POA: Diagnosis not present

## 2015-10-20 HISTORY — PX: FLEXIBLE SIGMOIDOSCOPY: SHX5431

## 2015-10-20 SURGERY — SIGMOIDOSCOPY, FLEXIBLE
Anesthesia: Moderate Sedation

## 2015-10-20 MED ORDER — STERILE WATER FOR IRRIGATION IR SOLN
Status: DC | PRN
  Administered 2015-10-20: 13:00:00

## 2015-10-20 MED ORDER — MEPERIDINE HCL 50 MG/ML IJ SOLN
INTRAMUSCULAR | Status: DC
Start: 2015-10-20 — End: 2015-10-20
  Filled 2015-10-20: qty 1

## 2015-10-20 MED ORDER — MEPERIDINE HCL 25 MG/ML IJ SOLN
INTRAMUSCULAR | Status: DC | PRN
  Administered 2015-10-20 (×2): 25 mg via INTRAVENOUS

## 2015-10-20 MED ORDER — MIDAZOLAM HCL 5 MG/5ML IJ SOLN
INTRAMUSCULAR | Status: AC
  Filled 2015-10-20: qty 10

## 2015-10-20 MED ORDER — SODIUM CHLORIDE 0.9 % IV SOLN
INTRAVENOUS | Status: DC
  Administered 2015-10-20: 1000 mL via INTRAVENOUS

## 2015-10-20 MED ORDER — MIDAZOLAM HCL 5 MG/5ML IJ SOLN
INTRAMUSCULAR | Status: DC | PRN
  Administered 2015-10-20 (×3): 2 mg via INTRAVENOUS

## 2015-10-20 NOTE — Op Note (Signed)
FLEXIBLE SIGMOIDOSCOPY PROCEDURE REPORT  PATIENT:  MILLENA GOSSAGE  MR#:  [id number] Birthdate:  05/17/1951, 64 y.o., female Endoscopist:  Dr. Rogene Houston, MD  Procedure Date: 10/20/2015  Procedure:   Flexible sigmoidoscopy  Indications:  Patient is 64 year old Caucasian female who has limb syndrome and history of colonic polyps in colon carcinoma. She is status post subtotal colectomy in November 2015. She is returning for surveillance examination of remaining lower GI tract.  Informed Consent:  The procedure and risks were reviewed with the patient and informed consent was obtained.  Medications:  Demerol 50 mg IV Versed 6 mg IV  Description of procedure:  After a digital rectal exam was performed, that colonoscope was advanced from the anus through the rectum and colon to the area of ileocolonic anastomosis. As scope was gradually withdrawn and mucosa was carefully examined and findings noted. Scope was retroflexed in the rectum to examine anorectal junction  Findings:   Prep suboptimal. Mucosa of distal sigmoid colon and rectum was unremarkable. Small hemorrhoids noted below the dentate line.   Therapeutic/Diagnostic Maneuvers Performed:   None  Complications:  None  EBL: None  Impression:  Small external hemorrhoids otherwise normal flexible sigmoidoscopy but exam somewhat limited because of quality of prep.  Recommendations:  Standard instructions given. Patient will return for another flexible sigmoidoscopy in 6 months following colonoscopy prep.  REHMAN,NAJEEB U  10/20/2015 1:58 PM  CC: Dr. Claretta Fraise, MD & Dr. Rayne Du ref. provider found CC: Dr. Laurier Nancy, MD at [facility]

## 2015-10-20 NOTE — Discharge Instructions (Signed)
Resume usual medications and diet. No driving for 24 hours. Physician will call with results of blood tests. Next flexible sigmoidoscopy in 6 months with full prep.  Gastrointestinal Endoscopy, Care After Refer to this sheet in the next few weeks. These instructions provide you with information on caring for yourself after your procedure. Your caregiver may also give you more specific instructions. Your treatment has been planned according to current medical practices, but problems sometimes occur. Call your caregiver if you have any problems or questions after your procedure. HOME CARE INSTRUCTIONS  If you were given medicine to help you relax (sedative), do not drive, operate machinery, or sign important documents for 24 hours.  Avoid alcohol and hot or warm beverages for the first 24 hours after the procedure.  Only take over-the-counter or prescription medicines for pain, discomfort, or fever as directed by your caregiver. You may resume taking your normal medicines unless your caregiver tells you otherwise. Ask your caregiver when you may resume taking medicines that may cause bleeding, such as aspirin, clopidogrel, or warfarin.  You may return to your normal diet and activities on the day after your procedure, or as directed by your caregiver. Walking may help to reduce any bloated feeling in your abdomen.  Drink enough fluids to keep your urine clear or pale yellow.  You may gargle with salt water if you have a sore throat. SEEK IMMEDIATE MEDICAL CARE IF:  You have severe nausea or vomiting.  You have severe abdominal pain, abdominal cramps that last longer than 6 hours, or abdominal swelling (distention).  You have severe shoulder or back pain.  You have trouble swallowing.  You have shortness of breath, your breathing is shallow, or you are breathing faster than normal.  You have a fever or a rapid heartbeat.  You vomit blood or material that looks like coffee  grounds.  You have bloody, black, or tarry stools. MAKE SURE YOU:  Understand these instructions.  Will watch your condition.  Will get help right away if you are not doing well or get worse.   This information is not intended to replace advice given to you by your health care provider. Make sure you discuss any questions you have with your health care provider.   Document Released: 06/12/2004 Document Revised: 11/19/2014 Document Reviewed: 01/29/2012 Elsevier Interactive Patient Education [employer].

## 2015-10-20 NOTE — H&P (Signed)
Cassidy Barber is an 64 y.o. female.   Chief Complaint: Patient is here for flexible sigmoidoscopy. HPI: This 64 year old Caucasian female was history of colonic polyps colon carcinoma. She was diagnosed with an syndrome. She had right hemicolectomy 2012 and in November last year she had subtotal colectomy for carcinoma and she remains in remission. Since her surgery she's of diarrhea. Now she is having 3-4 stools per day. Frequency has decreased. She denies abdominal or rectal bleeding.  Past Medical History  Diagnosis Date  . Liver disease   . Hernia of unspecified site of abdominal cavity without mention of obstruction or gangrene   . Thyroid disease   . Hypertension   . Primary biliary cirrhosis ([place]) 10/26/2013  . Anemia of chronic disease 10/26/2013    Past Surgical History  Procedure Laterality Date  . Abdominal hernia repair      Patient's states that she has had 8- 9 hernia surgeries  . Splenectomy  2006  . Cholecystectomy  2007  . Colonoscopy      Done at [facility]  . Upper gastrointestinal endoscopy      Done at [facility]  . Colon surgery  2008    Done at [facility]  . Hernia repair    . Liver transplant  [id number]  . Colon resection    . Abdominal hysterectomy      Family History  Problem Relation Age of Onset  . Prostate cancer Father   . Colon cancer Sister   . Healthy Son    Social History:  reports that she has never smoked. She has never used smokeless tobacco. She reports that she does not drink alcohol or use illicit drugs.  Allergies:  Allergies  Allergen Reactions  . Codeine Nausea Only  . Oxycodone Nausea Only  . Cephalexin Swelling    Swelling of arms, face, hands, per patient finished rest of bottle. Tolerated zosyn    Medications Prior to Admission  Medication Sig Dispense Refill  . acetaminophen (TYLENOL) 325 MG tablet Take 650 mg by mouth every 6 (six) hours as needed for mild pain or moderate pain.     [name] ALPRAZolam (XANAX) 0.5 MG tablet Take 1 tablet (0.5 mg  total) by mouth at bedtime as needed for anxiety or sleep. 30 tablet 5  . aspirin 325 MG tablet Take 325 mg by mouth daily.    . Cholecalciferol (VITAMIN D3) 2000 UNITS capsule Take 1 capsule by mouth daily.    . diphenoxylate-atropine (LOMOTIL) 2.5-0.025 MG per tablet Take 1 tablet by mouth 4 (four) times daily.    [name] EVEROLIMUS PO Take 5 mg by mouth daily.  Take 3 mg in the AM and 2 mg in the PM    . ferrous sulfate 325 (65 FE) MG tablet Take 325 mg by mouth 2 (two) times daily with a meal.    . levETIRAcetam (KEPPRA) 500 MG tablet Take 500 mg by mouth 2 (two) times daily.    [name] levothyroxine (SYNTHROID, LEVOTHROID) 150 MCG tablet Take 150 mcg by mouth daily.    [name] losartan (COZAAR) 25 MG tablet Take 25 mg by mouth daily.    . magnesium oxide (MAG-OX) 400 MG tablet Take 1 tablet by mouth daily.    . metoprolol succinate (TOPROL-XL) 25 MG 24 hr tablet Take 25 mg by mouth daily.    . pantoprazole (PROTONIX) 40 MG tablet Take 40 mg by mouth daily.    . potassium chloride SA (K-DUR,KLOR-CON) 20 MEQ tablet Take 20 mEq by mouth  daily.    . tacrolimus (PROGRAF) 1 MG capsule Take 2 mg by mouth 2 (two) times daily.    . ursodiol (ACTIGALL) 300 MG capsule Take 300 mg by mouth 2 (two) times daily.     . cholestyramine (QUESTRAN) 4 G packet Take 1 packet (4 g total) by mouth 3 (three) times daily with meals. 60 each 12  . hydrOXYzine (VISTARIL) 25 MG capsule Take 25 mg by mouth 3 (three) times daily.    . mupirocin ointment (BACTROBAN) 2 % Apply 1 application topically as needed.   1  . zolpidem (AMBIEN) 5 MG tablet Take 5 mg by mouth at bedtime as needed for sleep.      No results found for this or any previous visit (from the past 48 hour(s)). No results found.  ROS  Blood pressure 136/72, pulse 71, temperature 98 F (36.7 C), temperature source Oral, resp. rate 16, height 5\' 4"  (1.626 m), weight 141 lb (63.957 kg), SpO2 100 %. Physical Exam  Constitutional: She appears well-developed and  well-nourished.  HENT:  Mouth/Throat: Oropharynx is clear and moist.  Eyes: Conjunctivae are normal. No scleral icterus.  Neck: No thyromegaly present.  Cardiovascular: Normal rate, regular rhythm and normal heart sounds.   No murmur heard. Respiratory: Effort normal and breath sounds normal.  GI:  Multiple scars. Flabby abdominal wall. Soft and nontender without organomegaly or masses.  Musculoskeletal: She exhibits no edema.  Lymphadenopathy:    She has no cervical adenopathy.  Neurological: She is alert.  Skin: Skin is warm and dry.     Assessment/Plan History of colonic polyps and colon carcinoma. Lynch syndrome. Surveillance flexible sigmoidoscopy.  REHMAN,NAJEEB U 10/20/2015, 1:23 PM

## 2015-10-21 ENCOUNTER — Telehealth (INDEPENDENT_AMBULATORY_CARE_PROVIDER_SITE_OTHER): Payer: Self-pay | Admitting: *Deleted

## 2015-10-21 DIAGNOSIS — K7290 Hepatic failure, unspecified without coma: Secondary | ICD-10-CM

## 2015-10-21 DIAGNOSIS — R109 Unspecified abdominal pain: Secondary | ICD-10-CM

## 2015-10-21 DIAGNOSIS — K7682 Hepatic encephalopathy: Secondary | ICD-10-CM

## 2015-10-21 DIAGNOSIS — K743 Primary biliary cirrhosis: Secondary | ICD-10-CM

## 2015-10-21 DIAGNOSIS — C189 Malignant neoplasm of colon, unspecified: Secondary | ICD-10-CM

## 2015-10-21 LAB — CEA: CEA: 4.2 ng/mL (ref 0.0–4.7)

## 2015-10-21 NOTE — Telephone Encounter (Signed)
Per Dr.Rehman the patient will need to have labs drawn in 6 months. 

## 2015-10-24 ENCOUNTER — Encounter (HOSPITAL_COMMUNITY): Payer: Self-pay | Admitting: Internal Medicine

## 2015-10-24 DIAGNOSIS — Z4823 Encounter for aftercare following liver transplant: Secondary | ICD-10-CM | POA: Diagnosis not present

## 2015-10-24 DIAGNOSIS — Z79899 Other long term (current) drug therapy: Secondary | ICD-10-CM | POA: Diagnosis not present

## 2015-10-27 DIAGNOSIS — Z029 Encounter for administrative examinations, unspecified: Secondary | ICD-10-CM | POA: Diagnosis not present

## 2015-11-08 ENCOUNTER — Other Ambulatory Visit: Payer: Medicare Other

## 2015-11-08 DIAGNOSIS — Z944 Liver transplant status: Secondary | ICD-10-CM | POA: Diagnosis not present

## 2015-11-08 NOTE — Progress Notes (Signed)
Labs for [facility] cbc cmp magnesium phosphorus tacrolimus level dx Z94.4

## 2015-11-09 ENCOUNTER — Other Ambulatory Visit: Payer: Self-pay | Admitting: Family Medicine

## 2015-11-09 LAB — COMPREHENSIVE METABOLIC PANEL
A/G RATIO: 1.2 (ref 1.1–2.5)
ALT: 17 IU/L (ref 0–32)
AST: 21 IU/L (ref 0–40)
Albumin: 3.4 g/dL — ABNORMAL LOW (ref 3.6–4.8)
Alkaline Phosphatase: 155 IU/L — ABNORMAL HIGH (ref 39–117)
BUN/Creatinine Ratio: 16 (ref 11–26)
BUN: 12 mg/dL (ref 8–27)
CO2: 22 mmol/L (ref 18–29)
Calcium: 8.8 mg/dL (ref 8.7–10.3)
Chloride: 105 mmol/L (ref 96–106)
Creatinine, Ser: 0.74 mg/dL (ref 0.57–1.00)
GFR calc Af Amer: 99 mL/min/{1.73_m2} (ref >59)
GFR, EST NON AFRICAN AMERICAN: 86 mL/min/{1.73_m2} (ref >59)
GLUCOSE: 98 mg/dL (ref 65–99)
Globulin, Total: 2.9 g/dL (ref 1.5–4.5)
Potassium: 4.9 mmol/L (ref 3.5–5.2)
Sodium: 142 mmol/L (ref 134–144)
Total Protein: 6.3 g/dL (ref 6.0–8.5)

## 2015-11-09 LAB — CBC WITH DIFFERENTIAL/PLATELET
BASOS ABS: 0.1 10*3/uL (ref 0.0–0.2)
BASOS: 1 %
EOS (ABSOLUTE): 0.3 10*3/uL (ref 0.0–0.4)
Eos: 4 %
Hematocrit: 32.9 % — ABNORMAL LOW (ref 34.0–46.6)
Hemoglobin: 10.5 g/dL — ABNORMAL LOW (ref 11.1–15.9)
IMMATURE GRANULOCYTES: 0 %
Immature Grans (Abs): 0 10*3/uL (ref 0.0–0.1)
LYMPHS: 49 %
Lymphocytes Absolute: 3 10*3/uL (ref 0.7–3.1)
MCH: 26.9 pg (ref 26.6–33.0)
MCHC: 31.9 g/dL (ref 31.5–35.7)
MCV: 84 fL (ref 79–97)
MONOS ABS: 1 10*3/uL — AB (ref 0.1–0.9)
Monocytes: 16 %
NEUTROS ABS: 1.9 10*3/uL (ref 1.4–7.0)
NEUTROS PCT: 30 %
PLATELETS: 386 10*3/uL — AB (ref 150–379)
RBC: 3.9 x10E6/uL (ref 3.77–5.28)
RDW: 15.4 % (ref 12.3–15.4)
WBC: 6.2 10*3/uL (ref 3.4–10.8)

## 2015-11-09 LAB — TACROLIMUS LEVEL: TACROLIMUS LVL: 4.2 ng/mL (ref 2.0–20.0)

## 2015-11-09 LAB — PHOSPHORUS: PHOSPHORUS: 4.2 mg/dL (ref 2.5–4.5)

## 2015-11-09 LAB — MAGNESIUM: Magnesium: 1.8 mg/dL (ref 1.6–2.3)

## 2015-11-09 LAB — EVEROLIMUS: Everolimus: 4.8 ng/mL (ref 3.0–8.0)

## 2015-11-09 NOTE — Telephone Encounter (Signed)
Please review and advise.

## 2015-11-09 NOTE — Telephone Encounter (Signed)
Last filled and seen 09/27/15. [facility]

## 2015-11-10 NOTE — Telephone Encounter (Signed)
[facility] into [facility] Okayed per Dr Livia Snellen

## 2015-11-11 DIAGNOSIS — L570 Actinic keratosis: Secondary | ICD-10-CM | POA: Diagnosis not present

## 2015-11-21 ENCOUNTER — Encounter: Payer: Self-pay | Admitting: Family Medicine

## 2015-11-21 ENCOUNTER — Ambulatory Visit (INDEPENDENT_AMBULATORY_CARE_PROVIDER_SITE_OTHER): Payer: Medicare Other | Admitting: Family Medicine

## 2015-11-21 ENCOUNTER — Ambulatory Visit: Payer: Medicare Other | Admitting: Neurology

## 2015-11-21 VITALS — BP 144/91 | HR 74 | Temp 97.0°F | Ht 66.0 in | Wt 145.2 lb

## 2015-11-21 DIAGNOSIS — J0100 Acute maxillary sinusitis, unspecified: Secondary | ICD-10-CM | POA: Diagnosis not present

## 2015-11-21 MED ORDER — POTASSIUM CHLORIDE CRYS ER 20 MEQ PO TBCR: 20.0000 meq | EXTENDED_RELEASE_TABLET | Freq: Every day | ORAL | Status: DC

## 2015-11-21 MED ORDER — AMOXICILLIN 500 MG PO CAPS: 500.0000 mg | ORAL_CAPSULE | Freq: Three times a day (TID) | ORAL | Status: DC

## 2015-11-21 NOTE — Progress Notes (Signed)
Subjective:    Patient ID: Cassidy Barber, female    DOB: September 26, 1951, 65 y.o.   MRN: [id number]  HPI 65 year old female with a one-week history of sinus congestion and drainage. She denies any chest congestion or cough. Her past history is significant for liver transplant 2 years ago. She is taking antirejection drugs, tacrolimus. She has been followed by doctors at [facility] but now will be seeing a doctor at [facility].  Patient Active Problem List   Diagnosis Date Noted  . Seizures ([place]) 09/27/2015  . Essential hypertension, benign 03/10/2015  . Hx of liver transplant ([place]) 12/08/2014  . Diarrhea 12/08/2014  . Colon cancer ([place]) 12/08/2014  . Hepatic encephalopathy ([place]) 10/26/2013  . Anemia of chronic disease 10/26/2013  . Personal history of colonic polyps 12/27/2012  . PBC (primary biliary cirrhosis) ([place]) 03/24/2012  . Hypothyroidism 03/24/2012  . Ventral hernia, recurrent 03/24/2012   Outpatient Encounter Prescriptions as of 11/21/2015  Medication Sig  . acetaminophen (TYLENOL) 325 MG tablet Take 650 mg by mouth every 6 (six) hours as needed for mild pain or moderate pain.   [name] ALPRAZolam (XANAX) 0.5 MG tablet TAKE 1 TABLET AT BEDTIME AS NEEDED FOR SLEEP  . aspirin 325 MG tablet Take 325 mg by mouth daily.  . Cholecalciferol (VITAMIN D3) 2000 UNITS capsule Take 1 capsule by mouth daily.  . cholestyramine (QUESTRAN) 4 G packet Take 1 packet (4 g total) by mouth 3 (three) times daily with meals.  [name] EVEROLIMUS PO Take 5 mg by mouth daily.  Take 3 mg in the AM and 2 mg in the PM  . ferrous sulfate 325 (65 FE) MG tablet Take 325 mg by mouth 2 (two) times daily with a meal.  . hydrOXYzine (VISTARIL) 25 MG capsule Take 25 mg by mouth 3 (three) times daily.  [name] levETIRAcetam (KEPPRA) 500 MG tablet Take 500 mg by mouth 2 (two) times daily.  [name] levothyroxine (SYNTHROID, LEVOTHROID) 150 MCG tablet Take 150 mcg by mouth daily.  [name] losartan (COZAAR) 25 MG tablet Take 25 mg by mouth daily.  .  magnesium oxide (MAG-OX) 400 MG tablet Take 1 tablet by mouth daily.  . metoprolol succinate (TOPROL-XL) 25 MG 24 hr tablet Take 25 mg by mouth daily.  . mupirocin ointment (BACTROBAN) 2 % Apply 1 application topically as needed.   . pantoprazole (PROTONIX) 40 MG tablet Take 40 mg by mouth daily.  . potassium chloride SA (K-DUR,KLOR-CON) 20 MEQ tablet Take 20 mEq by mouth daily.  . tacrolimus (PROGRAF) 1 MG capsule Take 2 mg by mouth 2 (two) times daily.  . ursodiol (ACTIGALL) 300 MG capsule Take 300 mg by mouth 2 (two) times daily.   [name] zolpidem (AMBIEN) 5 MG tablet Take 5 mg by mouth at bedtime as needed for sleep.  . diphenoxylate-atropine (LOMOTIL) 2.5-0.025 MG per tablet Take 1 tablet by mouth 4 (four) times daily. Reported on 11/21/2015   No facility-administered encounter medications on file as of 11/21/2015.      Review of Systems  Constitutional: Negative.   HENT: Positive for congestion and postnasal drip.   Respiratory: Negative.   Cardiovascular: Negative.   Neurological: Negative.   Psychiatric/Behavioral: Negative.        Objective:   Physical Exam  Constitutional: She is oriented to person, place, and time. She appears well-developed and well-nourished.  HENT:  There is tenderness in the maxillary area with percussion  Neck: Normal range of motion.  Cardiovascular: Normal rate and regular rhythm.  Pulmonary/Chest: Effort normal and breath sounds normal. She has no wheezes.  Neurological: She is alert and oriented to person, place, and time.          Assessment & Plan:  1. Acute maxillary sinusitis, recurrence not specified Given complicated past history and immunosuppression will treat with amoxicillin 500 3 times a day. Continue using Mucinex to keep sinus is open and draining  Wardell Honour MD

## 2015-11-22 ENCOUNTER — Ambulatory Visit (INDEPENDENT_AMBULATORY_CARE_PROVIDER_SITE_OTHER): Payer: Medicare Other | Admitting: Internal Medicine

## 2015-11-22 ENCOUNTER — Encounter (INDEPENDENT_AMBULATORY_CARE_PROVIDER_SITE_OTHER): Payer: Self-pay | Admitting: Internal Medicine

## 2015-11-22 ENCOUNTER — Telehealth: Payer: Self-pay | Admitting: Family Medicine

## 2015-11-22 VITALS — BP 102/70 | HR 66 | Temp 98.1°F | Resp 18 | Ht 66.0 in | Wt 148.2 lb

## 2015-11-22 DIAGNOSIS — K529 Noninfective gastroenteritis and colitis, unspecified: Secondary | ICD-10-CM

## 2015-11-22 DIAGNOSIS — K219 Gastro-esophageal reflux disease without esophagitis: Secondary | ICD-10-CM

## 2015-11-22 MED ORDER — DICYCLOMINE HCL 20 MG PO TABS: 20.0000 mg | ORAL_TABLET | Freq: Three times a day (TID) | ORAL | Status: DC

## 2015-11-22 MED ORDER — PANTOPRAZOLE SODIUM 40 MG PO TBEC: 40.0000 mg | DELAYED_RELEASE_TABLET | Freq: Every day | ORAL | Status: DC

## 2015-11-22 MED ORDER — DIPHENOXYLATE-ATROPINE 2.5-0.025 MG PO TABS: 1.0000 | ORAL_TABLET | Freq: Four times a day (QID) | ORAL | Status: DC

## 2015-11-22 MED ORDER — LEVOTHYROXINE SODIUM 150 MCG PO TABS: 150.0000 ug | ORAL_TABLET | Freq: Every day | ORAL | Status: DC

## 2015-11-22 NOTE — Telephone Encounter (Signed)
Okay at current level for 6 mos. Thanks ws 

## 2015-11-22 NOTE — Telephone Encounter (Signed)
Pt aware rx sent to pharmacy.

## 2015-11-22 NOTE — Telephone Encounter (Signed)
We have never given her levothyroxine but can see it in her chart, ok to send in refill?

## 2015-11-22 NOTE — Progress Notes (Signed)
Presenting complaint;  Follow-up for chronic diarrhea.  Subjective:   Patient is 65 year old Caucasian female who is here for scheduled visit regarding chronic diarrhea.  She is having as many as 6-8 stools per day. She has not had a normal stool in several months.she has urgency and has had a few accidents since her last visit. She generally would not eat a meal if she has to leave home for errands.   She has ganed 4 poundher l visit of [id number].  She denies nause vomitig abdoiapain melena or rectal bleeding.  She is interested in switching her anti-spasmodic because Librax has high co-pay.  She also needs to know if she can stop urso.  Current Medications: Outpatient Encounter Prescriptions as of 11/22/2015  Medication Sig  . acetaminophen (TYLENOL) 325 MG tablet Take 650 mg by mouth every 6 (six) hours as needed for mild pain or moderate pain.   [name] ALPRAZolam (XANAX) 0.5 MG tablet TAKE 1 TABLET AT BEDTIME AS NEEDED FOR SLEEP  . amoxicillin (AMOXIL) 500 MG capsule Take 1 capsule (500 mg total) by mouth 3 (three) times daily.  [name] aspirin 325 MG tablet Take 325 mg by mouth daily.  . Cholecalciferol (VITAMIN D3) 2000 UNITS capsule Take 1 capsule by mouth daily.  . diphenoxylate-atropine (LOMOTIL) 2.5-0.025 MG per tablet Take 1 tablet by mouth 4 (four) times daily. Reported on 11/22/2015  . EVEROLIMUS PO Take 5 mg by mouth daily.  Take 3 mg in the AM and 2 mg in the PM  . ferrous sulfate 325 (65 FE) MG tablet Take 325 mg by mouth 2 (two) times daily with a meal.  . hydrOXYzine (VISTARIL) 25 MG capsule Take 25 mg by mouth 3 (three) times daily.  [name] levETIRAcetam (KEPPRA) 500 MG tablet Take 500 mg by mouth 2 (two) times daily.  [name] levothyroxine (SYNTHROID, LEVOTHROID) 150 MCG tablet Take 1 tablet (150 mcg total) by mouth daily.  [name] losartan (COZAAR) 25 MG tablet Take 25 mg by mouth daily.  . magnesium oxide (MAG-OX) 400 MG tablet Take 250 mg by mouth daily. Take 1 tablet by mouth daily.  . metoprolol  succinate (TOPROL-XL) 25 MG 24 hr tablet Take 25 mg by mouth daily.  . mupirocin ointment (BACTROBAN) 2 % Apply 1 application topically as needed.   . pantoprazole (PROTONIX) 40 MG tablet Take 40 mg by mouth daily.  . potassium chloride SA (K-DUR,KLOR-CON) 20 MEQ tablet Take 1 tablet (20 mEq total) by mouth daily.  . tacrolimus (PROGRAF) 1 MG capsule Take 2 mg by mouth 2 (two) times daily.  . ursodiol (ACTIGALL) 300 MG capsule Take 300 mg by mouth 2 (two) times daily.   [name] zolpidem (AMBIEN) 5 MG tablet Take 5 mg by mouth at bedtime as needed for sleep.  . cholestyramine (QUESTRAN) 4 G packet Take 1 packet (4 g total) by mouth 3 (three) times daily with meals. (Patient not taking: Reported on 11/22/2015)   No facility-administered encounter medications on file as of 11/22/2015.     Objective: Blood pressure 102/70, pulse 66, temperature 98.1 F (36.7 C), temperature source Oral, resp. rate 18, height 5\' 6"  (1.676 m), weight 148 lb 3.2 oz (67.223 kg). Patient is alert and in no acute distress. Conjunctiva is pink. Sclera is nonicteric Oropharyngeal mucosa is normal. No neck masses or thyromegaly noted. Cardiac exam with regular rhythm normal S1 and S2. No murmur or gallop noted. Lungs are clear to auscultation. Abdomen is symmetrical. Bowel sounds are normal. On palpation it is  soft and nontender without organomegaly or masses. No LE edema or clubbing noted.  Labs/studies Results:  lab studies from 11/08/2015 noted  H&H 10.5 and 32.9 and platelt count 386K.  AST 21, ALT 17, AP 155 and albumin 2.4.  CEA was 4.2 on 10/20/2015.  Assessment:  #1.  Chronic diarrhea secondary to loss of colon. Frequency should decrease with intestinal adaptation. #2.  History of PBC. Status post LDLT two years ago. Alkaline phosphatase is mildly elevated. Transaminases are normal. She could come off Urso if Dr. Merrilee Jansky, her transplant hepatogist at [facility] agrees. #3. GERD. Treatment is working. #4.  History of  colon carcinoma. She has Lynch syndrome. She underwent flexible sigmoidoscopy one month but prep was suboptimal.  Plan:  Discontinue Librax because of cost reasons. Dicyclomine 20 mg by muth 30 minutes before each meal. Continue diphenoxylate 1 tablet by mouth before each meal. New prescription give for one month with two refills. New prescription given for pantoprazole. Decrease Urso to 300 mg by mouth daily until office visit with Dr.Berg of [place]. Office visit in 4 months.     Addendum; I talk with Dr. Laurier Nancy of [facility]. He agrees with stopping Urso. Patient called and message left on her cell phone.

## 2015-11-22 NOTE — Patient Instructions (Addendum)
Remember to take Prevalite or cholestyramine 2 hours before or after taking other medications. Decrease Urso to 300 mg by mouth daily

## 2015-11-30 ENCOUNTER — Encounter: Payer: Self-pay | Admitting: Neurology

## 2015-11-30 ENCOUNTER — Ambulatory Visit (INDEPENDENT_AMBULATORY_CARE_PROVIDER_SITE_OTHER): Payer: Medicare Other | Admitting: Neurology

## 2015-11-30 VITALS — BP 110/72 | HR 73 | Resp 16 | Wt 148.0 lb

## 2015-11-30 DIAGNOSIS — R569 Unspecified convulsions: Secondary | ICD-10-CM

## 2015-11-30 DIAGNOSIS — Z944 Liver transplant status: Secondary | ICD-10-CM | POA: Insufficient documentation

## 2015-11-30 MED ORDER — LEVETIRACETAM 500 MG PO TABS: ORAL_TABLET | ORAL | Status: DC

## 2015-11-30 NOTE — Patient Instructions (Addendum)
1. Routine EEG today 2. If EEG is normal, start reducing Keppra 500mg : Take 1 tab twice a day for 2 weeks, then 1 tab at bedtime for 2 weeks, then stop. 3. With medication taper, recommend holding off on driving for 6 months 4. Follow-up in 2-3 months, call for any changes  YOU HAVE BEEN SCHEDULED AT [facility] FOR MRI ON 12/09/15. PLEASE ARRIVE @ 12:30PM.   [place], [place] [address]  [phone]

## 2015-11-30 NOTE — Procedures (Signed)
ELECTROENCEPHALOGRAM REPORT  Date of Study: 11/30/2015  Patient's Name: Cassidy Barber MRN: [id number] Date of Birth: 05-20-1951  Referring Provider: Dr. Ellouise Newer  Clinical History: This is a 65 year old woman who had a seizure after liver transplant. She is interested in tapering off Keppra.  Medications: Keppra, Tylenol, Xanax, Amoxil, ASA, Vitamin D, Questran, 65Fe, Bentyl, Everolimus, Lomotil, Vistaril,Levothyroxine, Cozaar, Mag-Ox, Toprol XL, Protonix, K-Dur,KlorCon, Ptograf, Bactroban  Technical Summary: A multichannel digital EEG recording measured by the international 10-20 system with electrodes applied with paste and impedances below 5000 ohms performed in our laboratory with EKG monitoring in an awake and asleep patient.  Hyperventilation and photic stimulation were performed.  The digital EEG was referentially recorded, reformatted, and digitally filtered in a variety of bipolar and referential montages for optimal display.    Description: The patient is awake and asleep during the recording.  During maximal wakefulness, there is a symmetric, medium voltage 11 Hz posterior dominant rhythm that attenuates with eye opening.  The record is symmetric.  During drowsiness and stage I sleep, there is an increase in theta slowing of the background with rare vertex waves seen.  Hyperventilation and photic stimulation did not elicit any abnormalities.  There were no epileptiform discharges or electrographic seizures seen.    EKG lead was unremarkable.  Impression: This awake and asleep EEG is normal.    Clinical Correlation: A normal EEG does not exclude a clinical diagnosis of epilepsy.  If further clinical questions remain, prolonged EEG may be helpful.  Clinical correlation is advised.   Ellouise Newer, M.D.

## 2015-11-30 NOTE — Progress Notes (Signed)
NEUROLOGY CONSULTATION NOTE  Cassidy Barber MRN: [id number] DOB: May 15, 1951  Referring provider: Dr. Claretta Fraise Primary care provider: Dr. Claretta Fraise  Reason for consult:  Would like to stop Keppra  Dear Dr Livia Snellen:  Thank you for your kind referral of Cassidy Barber for consultation of the above symptoms. Although her history is well known to you, please allow me to reiterate it for the purpose of our medical record. Records and images were personally reviewed where available.  HISTORY OF PRESENT ILLNESS: This is a pleasant 65 year old right-handed woman with a history of hypertension, hypothyroidism, liver cirrhosis s/p liver transplant in January 2015, who had a seizure during her hospitalization for the transplant. Per records reviewed from [facility], she began mumbling, became unresponsive, eyes rolled back, then both arms started shaking. Her hospital course was complicated by encephalopathy believed to be hepatic in nature, and she was also on Tacrolimus at the time of the seizure. She had an EEG which was abnormal due to background slowing and FIRDA, indicating moderate encephalopathy, no epileptiform discharges seen. Her MRI brain without contrast was abnormal, showing symmetric signal abnormality involving the posterior more than anterior centrum semiovale, major forcepses, the posterior corpus callosum and the periventricular white matter of the temporal horn of the lateral ventricles. This might represent osmotic, metabolic, or toxic abnormality versus infection. Specific considerations include tacrolimus toxicity, hyperbilirubinemia in the setting of hepatic encephalopathy. The appearance would be atypical for PRES. Recommend short interval followup. She reports that she has not had a follow-up MRI brain. She was discharged home on Keppra 1000mg  BID. She had seen Neurology in November 2015 after her hospital stay, and tapering of Keppra was discussed, however she declined at that time  due to concern for repeat seizures. She denies any prior history of seizures. No seizures since January 2015. She denies any staring/unresponsive episodes, gaps in time, olfactory/gustatory hallucinations, deja vu, rising epigastric sensation, focal numbness/tingling/weakness, myoclonic jerks. Memory is good. She denies any headaches, dizziness, vision changes, dysarthria, dysphagia, neck/back pain. She has chronic diarrhea 3-4 times a day.  She had a normal birth and early development.  There is no history of febrile convulsions, CNS infections such as meningitis/encephalitis, significant traumatic brain injury, neurosurgical procedures, or family history of seizures.  She is now interested in tapering off Keppra and had discussed this with her PCP. Keppra dose was decreased to 1000mg  in AM, 500mg  in PM. She reports doing well with taper and would like to continue. She denies any side effects on Keppra.  Laboratory Data: Lab Results  Component Value Date   WBC 6.2 11/08/2015   HGB 9.9* 02/21/2015   HCT 32.9* 11/08/2015   MCV 84 11/08/2015   PLT 386* 11/08/2015     Chemistry      Component Value Date/Time   NA 142 11/08/2015 1001   NA 134* 10/04/2014 0850   K 4.9 11/08/2015 1001   CL 105 11/08/2015 1001   CO2 22 11/08/2015 1001   BUN 12 11/08/2015 1001   BUN 15 10/04/2014 0850   CREATININE 0.74 11/08/2015 1001   CREATININE 0.62 12/23/2012 1140      Component Value Date/Time   CALCIUM 8.8 11/08/2015 1001   ALKPHOS 155* 11/08/2015 1001   AST 21 11/08/2015 1001   ALT 17 11/08/2015 1001   BILITOT <0.2 11/08/2015 1001   BILITOT <0.2 12/13/2014 0855      PAST MEDICAL HISTORY: Past Medical History  Diagnosis Date  . Liver disease   .  Hernia of unspecified site of abdominal cavity without mention of obstruction or gangrene   . Thyroid disease   . Hypertension   . Primary biliary cirrhosis ([place]) 10/26/2013  . Anemia of chronic disease 10/26/2013    PAST SURGICAL HISTORY: Past  Surgical History  Procedure Laterality Date  . Abdominal hernia repair      Patient's states that she has had 8- 9 hernia surgeries  . Splenectomy  2006  . Cholecystectomy  2007  . Colonoscopy      Done at [facility]  . Upper gastrointestinal endoscopy      Done at [facility]  . Colon surgery  2008    Done at [facility]  . Hernia repair    . Liver transplant  [id number]  . Colon resection    . Abdominal hysterectomy    . Flexible sigmoidoscopy N/A 10/20/2015    Procedure: FLEXIBLE SIGMOIDOSCOPY;  Surgeon: Rogene Houston, MD;  Location: [facility];  Service: Endoscopy;  Laterality: N/A;  73 - Dr Laural Golden has meeting until 1:00    MEDICATIONS: Current Outpatient Prescriptions on File Prior to Visit  Medication Sig Dispense Refill  . acetaminophen (TYLENOL) 325 MG tablet Take 650 mg by mouth every 6 (six) hours as needed for mild pain or moderate pain.     [name] ALPRAZolam (XANAX) 0.5 MG tablet TAKE 1 TABLET AT BEDTIME AS NEEDED FOR SLEEP 30 tablet 1  . amoxicillin (AMOXIL) 500 MG capsule Take 1 capsule (500 mg total) by mouth 3 (three) times daily. 30 capsule 0  . aspirin 325 MG tablet Take 325 mg by mouth daily.    . Cholecalciferol (VITAMIN D3) 2000 UNITS capsule Take 1 capsule by mouth daily.    . cholestyramine (QUESTRAN) 4 G packet Take 1 packet (4 g total) by mouth 3 (three) times daily with meals. 60 each 12  . dicyclomine (BENTYL) 20 MG tablet Take 1 tablet (20 mg total) by mouth 3 (three) times daily before meals. 90 tablet 5  . diphenoxylate-atropine (LOMOTIL) 2.5-0.025 MG tablet Take 1 tablet by mouth 4 (four) times daily. Reported on 11/22/2015 90 tablet 2  . EVEROLIMUS PO Take 5 mg by mouth daily.  Take 3 mg in the AM and 2 mg in the PM    . ferrous sulfate 325 (65 FE) MG tablet Take 325 mg by mouth 2 (two) times daily with a meal.    . hydrOXYzine (VISTARIL) 25 MG capsule Take 25 mg by mouth 3 (three) times daily as needed.     . levETIRAcetam (KEPPRA) 500 MG tablet Take 2 tablets in the am & 1  tablet in the pm    . levothyroxine (SYNTHROID, LEVOTHROID) 150 MCG tablet Take 1 tablet (150 mcg total) by mouth daily. 90 tablet 1  . losartan (COZAAR) 25 MG tablet Take 25 mg by mouth daily.    . magnesium oxide (MAG-OX) 400 MG tablet Take 250 mg by mouth daily. Take 1 tablet by mouth daily.    . metoprolol succinate (TOPROL-XL) 25 MG 24 hr tablet Take 25 mg by mouth daily.    . pantoprazole (PROTONIX) 40 MG tablet Take 1 tablet (40 mg total) by mouth daily before breakfast. 30 tablet 11  . potassium chloride SA (K-DUR,KLOR-CON) 20 MEQ tablet Take 1 tablet (20 mEq total) by mouth daily. 30 tablet 5  . tacrolimus (PROGRAF) 1 MG capsule Take 2 mg by mouth 2 (two) times daily.    [name] zolpidem (AMBIEN) 5 MG tablet Take 5 mg  by mouth at bedtime as needed for sleep.    . mupirocin ointment (BACTROBAN) 2 % Apply 1 application topically as needed. Reported on 11/30/2015  1   No current facility-administered medications on file prior to visit.    ALLERGIES: Allergies  Allergen Reactions  . Codeine Nausea Only  . Oxycodone Nausea Only  . Cephalexin Swelling    Swelling of arms, face, hands, per patient finished rest of bottle. Tolerated zosyn    FAMILY HISTORY: Family History  Problem Relation Age of Onset  . Prostate cancer Father   . Colon cancer Sister   . Healthy Son     SOCIAL HISTORY: Social History   Social History  . Marital Status: Married    Spouse Name: N/A  . Number of Children: 1  . Years of Education: N/A   Occupational History  . Not on file.   Social History Main Topics  . Smoking status: Never Smoker   . Smokeless tobacco: Never Used  . Alcohol Use: No  . Drug Use: No  . Sexual Activity: Yes    Birth Control/ Protection: None   Other Topics Concern  . Not on file   Social History Narrative    REVIEW OF SYSTEMS: Constitutional: No fevers, chills, or sweats, no generalized fatigue, change in appetite Eyes: No visual changes, double vision, eye  pain Ear, nose and throat: No hearing loss, ear pain, nasal congestion, sore throat Cardiovascular: No chest pain, palpitations Respiratory:  No shortness of breath at rest or with exertion, wheezes GastrointestinaI: No nausea, vomiting, +diarrhea, no abdominal pain, fecal incontinence Genitourinary:  No dysuria, urinary retention or frequency Musculoskeletal:  No neck pain, back pain Integumentary: No rash, pruritus, skin lesions Neurological: as above Psychiatric: No depression, insomnia, anxiety Endocrine: No palpitations, fatigue, diaphoresis, mood swings, change in appetite, change in weight, increased thirst Hematologic/Lymphatic:  No anemia, purpura, petechiae. Allergic/Immunologic: no itchy/runny eyes, nasal congestion, recent allergic reactions, rashes  PHYSICAL EXAM: Filed Vitals:   11/30/15 1243  BP: 110/72  Pulse: 73  Resp: 16   General: No acute distress Head:  Normocephalic/atraumatic Eyes: Fundoscopic exam shows bilateral sharp discs, no vessel changes, exudates, or hemorrhages Neck: supple, no paraspinal tenderness, full range of motion Back: No paraspinal tenderness Heart: regular rate and rhythm Lungs: Clear to auscultation bilaterally. Vascular: No carotid bruits. Skin/Extremities: No rash, no edema Neurological Exam: Mental status: alert and oriented to person, place, and time, no dysarthria or aphasia, Fund of knowledge is appropriate.  Remote memory intact. 1/3 delayed recall.  Attention and concentration are normal.    Able to name objects and repeat phrases. Cranial nerves: CN I: not tested CN II: pupils equal, round and reactive to light, visual fields intact, fundi unremarkable. CN III, IV, VI:  full range of motion, no nystagmus, no ptosis CN V: facial sensation intact CN VII: upper and lower face symmetric CN VIII: hearing intact to finger rub CN IX, X: gag intact, uvula midline CN XI: sternocleidomastoid and trapezius muscles intact CN XII: tongue  midline Bulk & Tone: normal, no fasciculations. Motor: 5/5 throughout with no pronator drift. Sensation: intact to light touch, cold, pin, vibration and joint position sense.  No extinction to double simultaneous stimulation.  Romberg test negative Deep Tendon Reflexes: +2 throughout, no ankle clonus Plantar responses: downgoing bilaterally Cerebellar: no incoordination on finger to nose, heel to shin. No dysdiadochokinesia Gait: narrow-based and steady, able to tandem walk adequately. Tremor: none  IMPRESSION: This is a pleasant 65 year old  right-handed woman with a history of hypertension, hypothyroidism, liver cirrhosis s/p liver transplant in January 2015, with a single seizure during the time of her hospitalization while she was encephalopathic (hyperbilirubinemia s/p transplant and on tacrolimus. She has been seizure-free since the provoked seizure, and would like to taper off Keppra. We discussed that we can certainly start doing this, however would do a repeat EEG first, and if normal, she will start tapering schedule. I discussed her MRI brain findings during her hospital stay, interval follow-up scan was recommended however she has not had this done. A follow-up MRI brain with and without contrast will be ordered. [place] driving laws were discussed with the patient, and she knows to stop driving after a seizure, until 6 months seizure-free. We also discussed that with medication taper, recommendation is to refrain from driving for 6 months. She expressed understanding and will follow-up in 2-3 months. She knows to call our office for any changes.   Thank you for allowing me to participate in the care of this patient. Please do not hesitate to call for any questions or concerns.   Ellouise Newer, M.D.  CC: Dr. Livia Snellen

## 2015-12-01 ENCOUNTER — Telehealth: Payer: Self-pay | Admitting: Family Medicine

## 2015-12-01 ENCOUNTER — Encounter: Payer: Self-pay | Admitting: Family

## 2015-12-01 ENCOUNTER — Ambulatory Visit (INDEPENDENT_AMBULATORY_CARE_PROVIDER_SITE_OTHER): Payer: Medicare Other | Admitting: Family

## 2015-12-01 VITALS — BP 129/77 | HR 65 | Temp 97.1°F | Ht 66.0 in | Wt 147.4 lb

## 2015-12-01 DIAGNOSIS — H9192 Unspecified hearing loss, left ear: Secondary | ICD-10-CM

## 2015-12-01 DIAGNOSIS — H938X2 Other specified disorders of left ear: Secondary | ICD-10-CM | POA: Diagnosis not present

## 2015-12-01 MED ORDER — MOMETASONE FUROATE 50 MCG/ACT NA SUSP: 2.0000 | Freq: Every day | NASAL | Status: DC

## 2015-12-01 MED ORDER — METOPROLOL SUCCINATE ER 25 MG PO TB24: 25.0000 mg | ORAL_TABLET | Freq: Every day | ORAL | Status: DC

## 2015-12-01 MED ORDER — PANTOPRAZOLE SODIUM 40 MG PO TBEC: 40.0000 mg | DELAYED_RELEASE_TABLET | Freq: Every day | ORAL | Status: DC

## 2015-12-01 MED ORDER — LOSARTAN POTASSIUM 25 MG PO TABS: 25.0000 mg | ORAL_TABLET | Freq: Every day | ORAL | Status: DC

## 2015-12-01 NOTE — Addendum Note (Signed)
Addended by: [name] A on: 12/01/2015 03:07 PM   Modules accepted: Orders

## 2015-12-01 NOTE — Progress Notes (Signed)
   Subjective:    Patient ID: Cassidy Barber, female    DOB: 1951/10/27, 65 y.o.   MRN: [id number]  PT presents to the office today for recurrent left ear fullness. Pt was given  Amoxicillin 500 mg TID and told to take Mucinex. PT states she is still taking both of these with no relief.  Ear Fullness  There is pain in the left ear. This is a recurrent problem. The current episode started 1 to 4 weeks ago. The problem occurs every few minutes. The problem has been waxing and waning. There has been no fever. The pain is at a severity of 0/10. The patient is experiencing no pain. Associated symptoms include rhinorrhea. Pertinent negatives include no coughing, diarrhea, ear discharge, headaches or sore throat. She has tried antibiotics for the symptoms. The treatment provided mild relief.      Review of Systems  Constitutional: Negative.   HENT: Positive for rhinorrhea. Negative for ear discharge and sore throat.   Eyes: Negative.   Respiratory: Negative.  Negative for cough and shortness of breath.   Cardiovascular: Negative.  Negative for palpitations.  Gastrointestinal: Negative.  Negative for diarrhea.  Endocrine: Negative.   Genitourinary: Negative.   Musculoskeletal: Negative.   Neurological: Negative.  Negative for headaches.  Hematological: Negative.   Psychiatric/Behavioral: Negative.   All other systems reviewed and are negative.      Objective:   Physical Exam  Constitutional: She is oriented to person, place, and time. She appears well-developed and well-nourished. No distress.  HENT:  Head: Normocephalic and atraumatic.  Right Ear: External ear normal.  Left Ear: External ear normal. A middle ear effusion is present.  Mouth/Throat: Oropharynx is clear and moist.  Nasal passage erythemas with mild swelling    Eyes: Pupils are equal, round, and reactive to light.  Neck: Normal range of motion. Neck supple. No thyromegaly present.  Cardiovascular: Normal rate, regular  rhythm, normal heart sounds and intact distal pulses.   No murmur heard. Pulmonary/Chest: Effort normal and breath sounds normal. No respiratory distress. She has no wheezes.  Abdominal: Soft. Bowel sounds are normal. She exhibits no distension. There is no tenderness.  Musculoskeletal: Normal range of motion. She exhibits no edema or tenderness.  Neurological: She is alert and oriented to person, place, and time. She has normal reflexes. No cranial nerve deficit.  Skin: Skin is warm and dry.  Psychiatric: She has a normal mood and affect. Her behavior is normal. Judgment and thought content normal.  Vitals reviewed.     BP 129/77 mmHg  Pulse 65  Temp(Src) 97.1 F (36.2 C) (Oral)  Ht 5\' 6"  (1.676 m)  Wt 147 lb 6.4 oz (66.86 kg)  BMI 23.80 kg/m2     Assessment & Plan:  1. Ear fullness, left - mometasone (NASONEX) 50 MCG/ACT nasal spray; Place 2 sprays into the nose daily.  Dispense: 17 g; Refill: 12 - Ambulatory referral to ENT  2. Decreased hearing, left - mometasone (NASONEX) 50 MCG/ACT nasal spray; Place 2 sprays into the nose daily.  Dispense: 17 g; Refill: 12 - Ambulatory referral to ENT  Continue Amoxicillin and Mucinex- Prob more viral? Will send to ENT to rule out hearing loss RTO prn  [name], FNP

## 2015-12-01 NOTE — Telephone Encounter (Signed)
-----   Message from Cameron Sprang, MD sent at 11/30/2015  4:54 PM EST ----- Pls let her know EEG is normal, can proceed with Keppra taper, call for any problems. Thanks

## 2015-12-01 NOTE — Patient Instructions (Signed)
Hearing Loss °Hearing loss is a partial or total loss of the ability to hear. This can be temporary or permanent, and it can happen in one or both ears. Hearing loss may be referred to as deafness. °Medical care is necessary to treat hearing loss properly and to prevent the condition from getting worse. Your hearing may partially or completely come back, depending on what caused your hearing loss and how severe it is. In some cases, hearing loss is permanent. °CAUSES °Common causes of hearing loss include:  °· Too much wax in the ear canal.   °· Infection of the ear canal or middle ear.   °· Fluid in the middle ear.   °· Injury to the ear or surrounding area.   °· An object stuck in the ear.   °· Prolonged exposure to loud sounds, such as music.   °Less common causes of hearing loss include:  °· Tumors in the ear.   °· Viral or bacterial infections, such as meningitis.   °· A hole in the eardrum (perforated eardrum). °· Problems with the hearing nerve that sends signals between the brain and the ear. °· Certain medicines.   °SYMPTOMS  °Symptoms of this condition may include: °· Difficulty telling the difference between sounds. °· Difficulty following a conversation when there is background noise. °· Lack of response to sounds in your environment. This may be most noticeable when you do not respond to startling sounds. °· Needing to turn up the volume on the television, radio, etc. °· Ringing in the ears. °· Dizziness. °· Pain in the ears. °DIAGNOSIS °This condition is diagnosed based on a physical exam and a hearing test (audiometry). The audiometry test will be performed by a hearing specialist (audiologist). You may also be referred to an ear, nose, and throat (ENT) specialist (otolaryngologist).  °TREATMENT °Treatment for recent onset of hearing loss may include:  °· Ear wax removal.   °· Being prescribed medicines to prevent infection (antibiotics).   °· Being prescribed medicines to reduce inflammation  (corticosteroids).   °HOME CARE INSTRUCTIONS °· If you were prescribed an antibiotic medicine, take it as told by your health care provider. Do not stop taking the antibiotic even if you start to feel better. °· Take over-the-counter and prescription medicines only as told by your health care provider. °· Avoid loud noises.   °· Return to your normal activities as told by your health care provider. Ask your health care provider what activities are safe for you. °· Keep all follow-up visits as told by your health care provider. This is important. °SEEK MEDICAL CARE IF:  °· You feel dizzy.   °· You develop new symptoms.   °· You vomit or feel nauseous.   °· You have a fever.   °SEEK IMMEDIATE MEDICAL CARE IF: °· You develop sudden changes in your vision.   °· You have severe ear pain.   °· You have new or increased weakness. °· You have a severe headache. °  °This information is not intended to replace advice given to you by your health care provider. Make sure you discuss any questions you have with your health care provider. °  °Document Released: 10/29/2005 Document Revised: 07/20/2015 Document Reviewed: 03/16/2015 °Elsevier Interactive Patient Education ©2016 [employer] ° °

## 2015-12-01 NOTE — Telephone Encounter (Signed)
Patient notified of results & advisement. 

## 2015-12-02 DIAGNOSIS — D849 Immunodeficiency, unspecified: Secondary | ICD-10-CM | POA: Insufficient documentation

## 2015-12-05 ENCOUNTER — Other Ambulatory Visit: Payer: Medicare Other

## 2015-12-05 DIAGNOSIS — T8649 Other complications of liver transplant: Principal | ICD-10-CM

## 2015-12-05 DIAGNOSIS — K831 Obstruction of bile duct: Secondary | ICD-10-CM

## 2015-12-05 DIAGNOSIS — D89810 Acute graft-versus-host disease: Secondary | ICD-10-CM

## 2015-12-06 LAB — CMP14+EGFR
A/G RATIO: 1.3 (ref 1.1–2.5)
ALT: 11 IU/L (ref 0–32)
AST: 22 IU/L (ref 0–40)
Albumin: 3.6 g/dL (ref 3.6–4.8)
Alkaline Phosphatase: 205 IU/L — ABNORMAL HIGH (ref 39–117)
BUN/Creatinine Ratio: 14 (ref 11–26)
BUN: 10 mg/dL (ref 8–27)
Bilirubin Total: <0.2 mg/dL (ref 0.0–1.2)
CALCIUM: 8.4 mg/dL — AB (ref 8.7–10.3)
CO2: 22 mmol/L (ref 18–29)
CREATININE: 0.74 mg/dL (ref 0.57–1.00)
Chloride: 103 mmol/L (ref 96–106)
GFR calc Af Amer: 99 mL/min/{1.73_m2} (ref >59)
GFR, EST NON AFRICAN AMERICAN: 86 mL/min/{1.73_m2} (ref >59)
Globulin, Total: 2.7 g/dL (ref 1.5–4.5)
Glucose: 93 mg/dL (ref 65–99)
POTASSIUM: 4.4 mmol/L (ref 3.5–5.2)
Sodium: 141 mmol/L (ref 134–144)
Total Protein: 6.3 g/dL (ref 6.0–8.5)

## 2015-12-06 LAB — CBC WITH DIFFERENTIAL/PLATELET
BASOS ABS: 0.1 10*3/uL (ref 0.0–0.2)
Basos: 1 %
EOS (ABSOLUTE): 0.2 10*3/uL (ref 0.0–0.4)
Eos: 3 %
HEMOGLOBIN: 10.1 g/dL — AB (ref 11.1–15.9)
Hematocrit: 33.3 % — ABNORMAL LOW (ref 34.0–46.6)
IMMATURE GRANS (ABS): 0 10*3/uL (ref 0.0–0.1)
Immature Granulocytes: 0 %
LYMPHS: 42 %
Lymphocytes Absolute: 2.4 10*3/uL (ref 0.7–3.1)
MCH: 25.6 pg — ABNORMAL LOW (ref 26.6–33.0)
MCHC: 30.3 g/dL — ABNORMAL LOW (ref 31.5–35.7)
MCV: 85 fL (ref 79–97)
MONOCYTES: 18 %
Monocytes Absolute: 1.1 10*3/uL — ABNORMAL HIGH (ref 0.1–0.9)
Neutrophils Absolute: 2.2 10*3/uL (ref 1.4–7.0)
Neutrophils: 36 %
Platelets: 424 10*3/uL — ABNORMAL HIGH (ref 150–379)
RBC: 3.94 x10E6/uL (ref 3.77–5.28)
RDW: 15.2 % (ref 12.3–15.4)
WBC: 5.9 10*3/uL (ref 3.4–10.8)

## 2015-12-06 LAB — IRON AND TIBC
IRON SATURATION: 8 % — AB (ref 15–55)
Iron: 20 ug/dL — ABNORMAL LOW (ref 27–139)
Total Iron Binding Capacity: 237 ug/dL — ABNORMAL LOW (ref 250–450)
UIBC: 217 ug/dL (ref 118–369)

## 2015-12-06 LAB — EVEROLIMUS: EVEROLIMUS: 5 ng/mL (ref 3.0–8.0)

## 2015-12-06 LAB — TACROLIMUS LEVEL: TACROLIMUS LVL: 3.1 ng/mL (ref 2.0–20.0)

## 2015-12-12 ENCOUNTER — Encounter: Payer: Self-pay | Admitting: Family Medicine

## 2015-12-12 ENCOUNTER — Ambulatory Visit (INDEPENDENT_AMBULATORY_CARE_PROVIDER_SITE_OTHER): Payer: Medicare Other

## 2015-12-12 ENCOUNTER — Ambulatory Visit (INDEPENDENT_AMBULATORY_CARE_PROVIDER_SITE_OTHER): Payer: Medicare Other | Admitting: Family Medicine

## 2015-12-12 ENCOUNTER — Ambulatory Visit: Payer: Medicare Other | Admitting: Pediatrics

## 2015-12-12 VITALS — BP 118/75 | HR 88 | Temp 98.0°F | Ht 66.0 in | Wt 149.0 lb

## 2015-12-12 DIAGNOSIS — M79641 Pain in right hand: Secondary | ICD-10-CM | POA: Diagnosis not present

## 2015-12-12 DIAGNOSIS — M79643 Pain in unspecified hand: Secondary | ICD-10-CM

## 2015-12-12 MED ORDER — HYDROCODONE-ACETAMINOPHEN 5-325 MG PO TABS
1.0000 | ORAL_TABLET | Freq: Four times a day (QID) | ORAL | Status: DC | PRN
Start: 2015-12-12 — End: 2016-01-23

## 2015-12-12 MED ORDER — PREDNISONE 10 MG PO TABS: ORAL_TABLET | ORAL | Status: DC

## 2015-12-12 NOTE — Progress Notes (Signed)
Subjective:  Patient ID: Cassidy Barber, female    DOB: [birth year]  Age: 65 y.o. MRN: [id number]  CC: Hand Pain   HPI Cassidy Barber presents for Sudden onset severe pain both hands and wrists starting yesterday evening. Was sitting watching TV. NKI.  Took hydrocodone aand a xanax at hs but only got 2-3 hours relief. Didn't sleep after that. Pain 8/10 now. Somewhat achy all over with onset concurrent with hands. This includes back, shoulder girdle and lower ext as well. [name]   History Cassidy Barber has a past medical history of Liver disease; Hernia of unspecified site of abdominal cavity without mention of obstruction or gangrene; Thyroid disease; Hypertension; Primary biliary cirrhosis ([place]) (10/26/2013); and Anemia of chronic disease (10/26/2013).   She has past surgical history that includes Abdominal hernia repair; Splenectomy (2006); Cholecystectomy (2007); Colonoscopy; Upper gastrointestinal endoscopy; Colon surgery (2008); Hernia repair; Liver transplant ([id number]); Colon resection; Abdominal hysterectomy; and Flexible sigmoidoscopy (N/A, 10/20/2015).   Her family history includes Colon cancer in her sister; Healthy in her son; Prostate cancer in her father.She reports that she has never smoked. She has never used smokeless tobacco. She reports that she does not drink alcohol or use illicit drugs.    ROS Review of Systems  Constitutional: Negative for fever, activity change and appetite change.  HENT: Negative for congestion, rhinorrhea and sore throat.   Eyes: Negative for visual disturbance.  Respiratory: Negative for cough and shortness of breath.   Cardiovascular: Negative for chest pain and palpitations.  Gastrointestinal: Negative for nausea, abdominal pain and diarrhea.  Genitourinary: Negative for dysuria.  Musculoskeletal: Negative for myalgias and arthralgias.    Objective:  BP 118/75 mmHg  Pulse 88  Temp(Src) 98 F (36.7 C) (Oral)  Ht '5\' 6"'$  (1.676 m)  Wt 149 lb (67.586 kg)   BMI 24.06 kg/m2  SpO2 100%  BP Readings from Last 3 Encounters:  12/12/15 118/75  12/01/15 129/77  11/30/15 110/72    Wt Readings from Last 3 Encounters:  12/12/15 149 lb (67.586 kg)  12/01/15 147 lb 6.4 oz (66.86 kg)  11/30/15 148 lb (67.132 kg)     Physical Exam  Constitutional: She is oriented to person, place, and time. She appears well-developed and well-nourished.  HENT:  Head: Normocephalic.  Neck: Normal range of motion.  Cardiovascular: Normal rate and regular rhythm.   No murmur heard. Pulmonary/Chest: Effort normal and breath sounds normal.  Musculoskeletal: She exhibits edema (All MCP &PIP, bilat) and tenderness.  Neurological: She is alert and oriented to person, place, and time. She has normal reflexes. She displays normal reflexes. No cranial nerve deficit. She exhibits normal muscle tone. Coordination normal.  Psychiatric: She has a normal mood and affect.     Lab Results  Component Value Date   WBC 5.9 12/05/2015   HGB 9.9* 02/21/2015   HCT 33.3* 12/05/2015   PLT 424* 12/05/2015   GLUCOSE 93 12/05/2015   CHOL 156 07/05/2014   TRIG 116 07/05/2014   HDL 60 07/05/2014   LDLCALC 73 07/05/2014   ALT 11 12/05/2015   AST 22 12/05/2015   NA 141 12/05/2015   K 4.4 12/05/2015   CL 103 12/05/2015   CREATININE 0.74 12/05/2015   BUN 10 12/05/2015   CO2 22 12/05/2015   TSH 3.750 10/10/2015   INR 1.1 06/07/2014    No results found.  Assessment & Plan:   Cassidy Barber was seen today for hand pain.  Diagnoses and all orders for this visit:  Pain  of hand, unspecified laterality -     DG Hand Complete Left; Future -     DG Hand Complete Right; Future -     CBC with Differential/Platelet -     CMP14+EGFR -     Uric acid -     Rheumatoid factor -     Sedimentation rate  Other orders -     predniSONE (DELTASONE) 10 MG tablet; Take 5 daily for 3 days followed by 4,3,2 and 1 for 3 days each. -     HYDROcodone-acetaminophen (NORCO) 5-325 MG tablet; Take 1  tablet by mouth every 6 (six) hours as needed for moderate pain.    Due to sudden onset, will look at rheumatoid and other CVD. XR showed significant arthritis, but doesn't explain the sudden onset.   I am having Cassidy Barber start on predniSONE and HYDROcodone-acetaminophen. I am also having her maintain her acetaminophen, aspirin, hydrOXYzine, ferrous sulfate, tacrolimus, zolpidem, EVEROLIMUS PO, magnesium oxide, Vitamin D3, cholestyramine, mupirocin ointment, ALPRAZolam, potassium chloride SA, levothyroxine, diphenoxylate-atropine, dicyclomine, levETIRAcetam, mometasone, metoprolol succinate, losartan, and pantoprazole.  Meds ordered this encounter  Medications  . predniSONE (DELTASONE) 10 MG tablet    Sig: Take 5 daily for 3 days followed by 4,3,2 and 1 for 3 days each.    Dispense:  45 tablet    Refill:  0  . HYDROcodone-acetaminophen (NORCO) 5-325 MG tablet    Sig: Take 1 tablet by mouth every 6 (six) hours as needed for moderate pain.    Dispense:  30 tablet    Refill:  0     Follow-up: Return if symptoms worsen or fail to improve.  Claretta Fraise, M.D.

## 2015-12-13 LAB — CMP14+EGFR
ALBUMIN: 3.4 g/dL — AB (ref 3.6–4.8)
ALT: 27 IU/L (ref 0–32)
AST: 40 IU/L (ref 0–40)
Albumin/Globulin Ratio: 1.4 (ref 1.1–2.5)
Alkaline Phosphatase: 178 IU/L — ABNORMAL HIGH (ref 39–117)
BUN / CREAT RATIO: 13 (ref 11–26)
BUN: 10 mg/dL (ref 8–27)
Bilirubin Total: 0.3 mg/dL (ref 0.0–1.2)
CALCIUM: 8.3 mg/dL — AB (ref 8.7–10.3)
CO2: 22 mmol/L (ref 18–29)
CREATININE: 0.76 mg/dL (ref 0.57–1.00)
Chloride: 100 mmol/L (ref 96–106)
GFR, EST AFRICAN AMERICAN: 96 mL/min/{1.73_m2} (ref >59)
GFR, EST NON AFRICAN AMERICAN: 83 mL/min/{1.73_m2} (ref >59)
GLOBULIN, TOTAL: 2.5 g/dL (ref 1.5–4.5)
GLUCOSE: 120 mg/dL — AB (ref 65–99)
Potassium: 4.3 mmol/L (ref 3.5–5.2)
SODIUM: 136 mmol/L (ref 134–144)
TOTAL PROTEIN: 5.9 g/dL — AB (ref 6.0–8.5)

## 2015-12-13 LAB — CBC WITH DIFFERENTIAL/PLATELET
BASOS: 0 %
Basophils Absolute: 0 10*3/uL (ref 0.0–0.2)
EOS (ABSOLUTE): 0 10*3/uL (ref 0.0–0.4)
Eos: 0 %
HEMATOCRIT: 32 % — AB (ref 34.0–46.6)
HEMOGLOBIN: 10.1 g/dL — AB (ref 11.1–15.9)
IMMATURE GRANULOCYTES: 0 %
Immature Grans (Abs): 0 10*3/uL (ref 0.0–0.1)
Lymphocytes Absolute: 2.4 10*3/uL (ref 0.7–3.1)
Lymphs: 23 %
MCH: 25.8 pg — ABNORMAL LOW (ref 26.6–33.0)
MCHC: 31.6 g/dL (ref 31.5–35.7)
MCV: 82 fL (ref 79–97)
MONOS ABS: 0.8 10*3/uL (ref 0.1–0.9)
Monocytes: 7 %
NEUTROS ABS: 7.4 10*3/uL — AB (ref 1.4–7.0)
NEUTROS PCT: 70 %
Platelets: 337 10*3/uL (ref 150–379)
RBC: 3.91 x10E6/uL (ref 3.77–5.28)
RDW: 15 % (ref 12.3–15.4)
WBC: 10.6 10*3/uL (ref 3.4–10.8)

## 2015-12-13 LAB — URIC ACID: Uric Acid: 5.4 mg/dL (ref 2.5–7.1)

## 2015-12-13 LAB — SEDIMENTATION RATE: SED RATE: 15 mm/h (ref 0–40)

## 2015-12-13 LAB — RHEUMATOID FACTOR: Rhuematoid fact SerPl-aCnc: 14.1 IU/mL — ABNORMAL HIGH (ref 0.0–13.9)

## 2015-12-19 ENCOUNTER — Other Ambulatory Visit (INDEPENDENT_AMBULATORY_CARE_PROVIDER_SITE_OTHER): Payer: Medicare Other

## 2015-12-19 ENCOUNTER — Other Ambulatory Visit: Payer: Self-pay | Admitting: *Deleted

## 2015-12-19 DIAGNOSIS — D89810 Acute graft-versus-host disease: Secondary | ICD-10-CM | POA: Diagnosis not present

## 2015-12-19 DIAGNOSIS — K831 Obstruction of bile duct: Secondary | ICD-10-CM

## 2015-12-19 DIAGNOSIS — T8649 Other complications of liver transplant: Principal | ICD-10-CM

## 2015-12-21 LAB — SPECIMEN STATUS REPORT

## 2015-12-22 LAB — CBC WITH DIFFERENTIAL/PLATELET
BASOS ABS: 0.1 10*3/uL (ref 0.0–0.2)
Basos: 1 %
EOS (ABSOLUTE): 0.5 10*3/uL — ABNORMAL HIGH (ref 0.0–0.4)
Eos: 5 %
HEMOGLOBIN: 10.8 g/dL — AB (ref 11.1–15.9)
Hematocrit: 34.2 % (ref 34.0–46.6)
IMMATURE GRANS (ABS): 0 10*3/uL (ref 0.0–0.1)
Immature Granulocytes: 0 %
LYMPHS: 28 %
Lymphocytes Absolute: 2.8 10*3/uL (ref 0.7–3.1)
MCH: 26.5 pg — ABNORMAL LOW (ref 26.6–33.0)
MCHC: 31.6 g/dL (ref 31.5–35.7)
MCV: 84 fL (ref 79–97)
Monocytes Absolute: 1.5 10*3/uL — ABNORMAL HIGH (ref 0.1–0.9)
Monocytes: 16 %
NEUTROS PCT: 50 %
Neutrophils Absolute: 4.8 10*3/uL (ref 1.4–7.0)
Platelets: 418 10*3/uL — ABNORMAL HIGH (ref 150–379)
RBC: 4.08 x10E6/uL (ref 3.77–5.28)
RDW: 15.4 % (ref 12.3–15.4)
WBC: 9.7 10*3/uL (ref 3.4–10.8)

## 2015-12-22 LAB — CMP14+EGFR
ALT: 24 IU/L (ref 0–32)
AST: 23 IU/L (ref 0–40)
Albumin/Globulin Ratio: 1.3 (ref 1.1–2.5)
Albumin: 3.3 g/dL — ABNORMAL LOW (ref 3.6–4.8)
Alkaline Phosphatase: 168 IU/L — ABNORMAL HIGH (ref 39–117)
BUN/Creatinine Ratio: 16 (ref 11–26)
BUN: 13 mg/dL (ref 8–27)
Bilirubin Total: <0.2 mg/dL (ref 0.0–1.2)
CALCIUM: 8.4 mg/dL — AB (ref 8.7–10.3)
CO2: 23 mmol/L (ref 18–29)
CREATININE: 0.79 mg/dL (ref 0.57–1.00)
Chloride: 100 mmol/L (ref 96–106)
GFR calc Af Amer: 91 mL/min/{1.73_m2} (ref >59)
GFR, EST NON AFRICAN AMERICAN: 79 mL/min/{1.73_m2} (ref >59)
GLUCOSE: 96 mg/dL (ref 65–99)
Globulin, Total: 2.6 g/dL (ref 1.5–4.5)
POTASSIUM: 4.3 mmol/L (ref 3.5–5.2)
Sodium: 139 mmol/L (ref 134–144)
Total Protein: 5.9 g/dL — ABNORMAL LOW (ref 6.0–8.5)

## 2015-12-22 LAB — TACROLIMUS LEVEL: TACROLIMUS LVL: 2.4 ng/mL (ref 2.0–20.0)

## 2015-12-22 LAB — EVEROLIMUS: EVEROLIMUS: 2.9 ng/mL — AB (ref 3.0–8.0)

## 2015-12-27 DIAGNOSIS — Z944 Liver transplant status: Secondary | ICD-10-CM | POA: Diagnosis not present

## 2016-01-02 ENCOUNTER — Other Ambulatory Visit (INDEPENDENT_AMBULATORY_CARE_PROVIDER_SITE_OTHER): Payer: Medicare Other

## 2016-01-02 DIAGNOSIS — Z944 Liver transplant status: Secondary | ICD-10-CM

## 2016-01-02 NOTE — Progress Notes (Signed)
Labs for Dr Weldon Inches Cmp, cbc, everolimus, tacrolimus K83.1

## 2016-01-03 LAB — CBC WITH DIFFERENTIAL/PLATELET
BASOS ABS: 0.1 10*3/uL (ref 0.0–0.2)
Basos: 2 %
EOS (ABSOLUTE): 0.4 10*3/uL (ref 0.0–0.4)
Eos: 7 %
HEMOGLOBIN: 10.4 g/dL — AB (ref 11.1–15.9)
Hematocrit: 33.6 % — ABNORMAL LOW (ref 34.0–46.6)
IMMATURE GRANS (ABS): 0 10*3/uL (ref 0.0–0.1)
IMMATURE GRANULOCYTES: 0 %
LYMPHS ABS: 2.6 10*3/uL (ref 0.7–3.1)
Lymphs: 44 %
MCH: 26.1 pg — AB (ref 26.6–33.0)
MCHC: 31 g/dL — ABNORMAL LOW (ref 31.5–35.7)
MCV: 84 fL (ref 79–97)
Monocytes Absolute: 1 10*3/uL — ABNORMAL HIGH (ref 0.1–0.9)
Monocytes: 18 %
NEUTROS ABS: 1.7 10*3/uL (ref 1.4–7.0)
Neutrophils: 29 %
PLATELETS: 381 10*3/uL — AB (ref 150–379)
RBC: 3.99 x10E6/uL (ref 3.77–5.28)
RDW: 16.3 % — ABNORMAL HIGH (ref 12.3–15.4)
WBC: 5.9 10*3/uL (ref 3.4–10.8)

## 2016-01-03 LAB — CMP14+EGFR
A/G RATIO: 1.3 (ref 1.1–2.5)
ALBUMIN: 3.6 g/dL (ref 3.6–4.8)
ALT: 25 IU/L (ref 0–32)
AST: 28 IU/L (ref 0–40)
Alkaline Phosphatase: 189 IU/L — ABNORMAL HIGH (ref 39–117)
BUN / CREAT RATIO: 19 (ref 11–26)
BUN: 14 mg/dL (ref 8–27)
CHLORIDE: 105 mmol/L (ref 96–106)
CO2: 22 mmol/L (ref 18–29)
Calcium: 8.7 mg/dL (ref 8.7–10.3)
Creatinine, Ser: 0.72 mg/dL (ref 0.57–1.00)
GFR calc non Af Amer: 89 mL/min/{1.73_m2} (ref >59)
GFR, EST AFRICAN AMERICAN: 102 mL/min/{1.73_m2} (ref >59)
Globulin, Total: 2.7 g/dL (ref 1.5–4.5)
Glucose: 95 mg/dL (ref 65–99)
POTASSIUM: 4.6 mmol/L (ref 3.5–5.2)
SODIUM: 140 mmol/L (ref 134–144)
TOTAL PROTEIN: 6.3 g/dL (ref 6.0–8.5)

## 2016-01-03 LAB — EVEROLIMUS: Everolimus: 5.9 ng/mL (ref 3.0–8.0)

## 2016-01-03 LAB — TACROLIMUS LEVEL: TACROLIMUS LVL: 3.9 ng/mL (ref 2.0–20.0)

## 2016-01-16 ENCOUNTER — Other Ambulatory Visit: Payer: Self-pay | Admitting: Family Medicine

## 2016-01-16 ENCOUNTER — Telehealth: Payer: Self-pay | Admitting: Neurology

## 2016-01-16 ENCOUNTER — Other Ambulatory Visit: Payer: Medicare Other

## 2016-01-16 DIAGNOSIS — T8649 Other complications of liver transplant: Secondary | ICD-10-CM | POA: Diagnosis not present

## 2016-01-16 DIAGNOSIS — D899 Disorder involving the immune mechanism, unspecified: Secondary | ICD-10-CM | POA: Diagnosis not present

## 2016-01-16 DIAGNOSIS — K831 Obstruction of bile duct: Secondary | ICD-10-CM | POA: Diagnosis not present

## 2016-01-16 DIAGNOSIS — Z944 Liver transplant status: Secondary | ICD-10-CM | POA: Diagnosis not present

## 2016-01-16 NOTE — Telephone Encounter (Signed)
PT called in regards to her MRI and if she needed to keep her 01/30/2016 appointment CB# [phone]

## 2016-01-16 NOTE — Telephone Encounter (Signed)
Lmovm to rtn my call. 

## 2016-01-17 LAB — CMP14+EGFR
ALK PHOS: 231 IU/L — AB (ref 39–117)
ALT: 25 IU/L (ref 0–32)
AST: 29 IU/L (ref 0–40)
Albumin/Globulin Ratio: 1.3 (ref 1.1–2.5)
Albumin: 3.4 g/dL — ABNORMAL LOW (ref 3.6–4.8)
BUN/Creatinine Ratio: 26 (ref 11–26)
BUN: 17 mg/dL (ref 8–27)
Bilirubin Total: <0.2 mg/dL (ref 0.0–1.2)
CALCIUM: 8.5 mg/dL — AB (ref 8.7–10.3)
CO2: 20 mmol/L (ref 18–29)
CREATININE: 0.65 mg/dL (ref 0.57–1.00)
Chloride: 105 mmol/L (ref 96–106)
GFR calc Af Amer: 109 mL/min/{1.73_m2} (ref >59)
GFR, EST NON AFRICAN AMERICAN: 94 mL/min/{1.73_m2} (ref >59)
GLUCOSE: 105 mg/dL — AB (ref 65–99)
Globulin, Total: 2.7 g/dL (ref 1.5–4.5)
Potassium: 4.5 mmol/L (ref 3.5–5.2)
Sodium: 141 mmol/L (ref 134–144)
Total Protein: 6.1 g/dL (ref 6.0–8.5)

## 2016-01-17 LAB — CBC WITH DIFFERENTIAL/PLATELET
BASOS: 2 %
Basophils Absolute: 0.1 10*3/uL (ref 0.0–0.2)
EOS (ABSOLUTE): 0.3 10*3/uL (ref 0.0–0.4)
EOS: 4 %
HEMATOCRIT: 33.9 % — AB (ref 34.0–46.6)
Hemoglobin: 10.3 g/dL — ABNORMAL LOW (ref 11.1–15.9)
IMMATURE GRANS (ABS): 0 10*3/uL (ref 0.0–0.1)
IMMATURE GRANULOCYTES: 0 %
LYMPHS: 32 %
Lymphocytes Absolute: 3.1 10*3/uL (ref 0.7–3.1)
MCH: 26.1 pg — ABNORMAL LOW (ref 26.6–33.0)
MCHC: 30.4 g/dL — ABNORMAL LOW (ref 31.5–35.7)
MCV: 86 fL (ref 79–97)
MONOS ABS: 1.1 10*3/uL — AB (ref 0.1–0.9)
Monocytes: 12 %
NEUTROS PCT: 50 %
Neutrophils Absolute: 4.9 10*3/uL (ref 1.4–7.0)
PLATELETS: 362 10*3/uL (ref 150–379)
RBC: 3.94 x10E6/uL (ref 3.77–5.28)
RDW: 15.8 % — AB (ref 12.3–15.4)
WBC: 9.6 10*3/uL (ref 3.4–10.8)

## 2016-01-17 LAB — TACROLIMUS LEVEL: TACROLIMUS LVL: 3.9 ng/mL (ref 2.0–20.0)

## 2016-01-17 LAB — EVEROLIMUS: Everolimus: 8 ng/mL (ref 3.0–8.0)

## 2016-01-17 NOTE — Telephone Encounter (Signed)
Pt is returning your call please call back [phone]

## 2016-01-17 NOTE — Telephone Encounter (Signed)
I spoke with patient she wanted to check and see if she still needed to come back in for her f/u. I did tell her that she should still keep her f/u appt for Dr. Delice Lesch to talk with her about how she's been doing now that she is off of the [place].

## 2016-01-23 ENCOUNTER — Ambulatory Visit (INDEPENDENT_AMBULATORY_CARE_PROVIDER_SITE_OTHER): Payer: Medicare Other | Admitting: Family Medicine

## 2016-01-23 ENCOUNTER — Encounter: Payer: Self-pay | Admitting: Family Medicine

## 2016-01-23 VITALS — BP 138/84 | HR 71 | Temp 98.3°F | Ht 66.0 in | Wt 148.1 lb

## 2016-01-23 DIAGNOSIS — H6503 Acute serous otitis media, bilateral: Secondary | ICD-10-CM | POA: Diagnosis not present

## 2016-01-23 MED ORDER — ALPRAZOLAM 0.5 MG PO TABS: 0.5000 mg | ORAL_TABLET | Freq: Every evening | ORAL | Status: DC | PRN

## 2016-01-23 NOTE — Progress Notes (Signed)
Subjective:  Patient ID: Cassidy Barber, female    DOB: [birth year]  Age: 65 y.o. MRN: [id number]  CC: Nasal Congestion   HPI [name] presents for Patient presents with upper respiratory congestion. Ears feels stopped up. Having trouble hearing for the last few days. Rhinorrhea last week is almost gone.There is no fever no chills no sweats. The patient denies being short of breath. Onset was 7 days ago. Ears gradually worsening   History Keylan has a past medical history of Liver disease; Hernia of unspecified site of abdominal cavity without mention of obstruction or gangrene; Thyroid disease; Hypertension; Primary biliary cirrhosis ([place]) (10/26/2013); and Anemia of chronic disease (10/26/2013).   She has past surgical history that includes Abdominal hernia repair; Splenectomy (2006); Cholecystectomy (2007); Colonoscopy; Upper gastrointestinal endoscopy; Colon surgery (2008); Hernia repair; Liver transplant ([id number]); Colon resection; Abdominal hysterectomy; and Flexible sigmoidoscopy (N/A, 10/20/2015).   Her family history includes Colon cancer in her sister; Healthy in her son; Prostate cancer in her father.She reports that she has never smoked. She has never used smokeless tobacco. She reports that she does not drink alcohol or use illicit drugs.    ROS Review of Systems  Constitutional: Negative for fever, chills, activity change and appetite change.  HENT: Positive for congestion, ear pain, hearing loss, postnasal drip and rhinorrhea. Negative for ear discharge, nosebleeds, sinus pressure, sneezing and trouble swallowing.   Respiratory: Negative for chest tightness and shortness of breath.   Cardiovascular: Negative for chest pain and palpitations.  Skin: Negative for rash.    Objective:  BP 138/84 mmHg  Pulse 71  Temp(Src) 98.3 F (36.8 C) (Oral)  Ht 5\' 6"  (1.676 m)  Wt 148 lb 2 oz (67.189 kg)  BMI 23.92 kg/m2  BP Readings from Last 3 Encounters:  01/23/16 138/84    12/12/15 118/75  12/01/15 129/77    Wt Readings from Last 3 Encounters:  01/23/16 148 lb 2 oz (67.189 kg)  12/12/15 149 lb (67.586 kg)  12/01/15 147 lb 6.4 oz (66.86 kg)     Physical Exam  Constitutional: She appears well-developed and well-nourished.  HENT:  Head: Normocephalic and atraumatic.  Right Ear: No tenderness. A middle ear effusion is present. No decreased hearing is noted.  Left Ear: External ear normal. No tenderness. A middle ear effusion is present. No decreased hearing is noted.  Nose: Mucosal edema present. Right sinus exhibits no frontal sinus tenderness. Left sinus exhibits no frontal sinus tenderness.  Mouth/Throat: No oropharyngeal exudate or posterior oropharyngeal erythema.  Right sided effusion EAC, cleared with lavage  Neck: No Brudzinski's sign noted.  Pulmonary/Chest: Breath sounds normal. No respiratory distress.  Lymphadenopathy:       Head (right side): No preauricular adenopathy present.       Head (left side): No preauricular adenopathy present.       Right cervical: No superficial cervical adenopathy present.      Left cervical: No superficial cervical adenopathy present.     Lab Results  Component Value Date   WBC 9.6 01/16/2016   HGB 9.9* 02/21/2015   HCT 33.9* 01/16/2016   PLT 362 01/16/2016   GLUCOSE 105* 01/16/2016   CHOL 156 07/05/2014   TRIG 116 07/05/2014   HDL 60 07/05/2014   LDLCALC 73 07/05/2014   ALT 25 01/16/2016   AST 29 01/16/2016   NA 141 01/16/2016   K 4.5 01/16/2016   CL 105 01/16/2016   CREATININE 0.65 01/16/2016   BUN 17 01/16/2016  CO2 20 01/16/2016   TSH 3.750 10/10/2015   INR 1.1 06/07/2014    No results found.  Assessment & Plan:   Tailee was seen today for nasal congestion.  Diagnoses and all orders for this visit:  Bilateral acute serous otitis media, recurrence not specified  Other orders -     ALPRAZolam (XANAX) 0.5 MG tablet; Take 1 tablet (0.5 mg total) by mouth at bedtime as needed. for  sleep     I have discontinued Ms. Trausch zolpidem, levETIRAcetam, predniSONE, and HYDROcodone-acetaminophen. I have also changed her ALPRAZolam. Additionally, I am having her maintain her acetaminophen, aspirin, hydrOXYzine, ferrous sulfate, tacrolimus, EVEROLIMUS PO, magnesium oxide, Vitamin D3, cholestyramine, mupirocin ointment, potassium chloride SA, levothyroxine, diphenoxylate-atropine, dicyclomine, mometasone, metoprolol succinate, losartan, and pantoprazole.  Meds ordered this encounter  Medications  . ALPRAZolam (XANAX) 0.5 MG tablet    Sig: Take 1 tablet (0.5 mg total) by mouth at bedtime as needed. for sleep    Dispense:  30 tablet    Refill:  1     Follow-up: Return if symptoms worsen or fail to improve.  Claretta Fraise, M.D.

## 2016-01-24 ENCOUNTER — Telehealth: Payer: Self-pay | Admitting: Family Medicine

## 2016-01-24 DIAGNOSIS — Z944 Liver transplant status: Secondary | ICD-10-CM | POA: Diagnosis not present

## 2016-01-24 MED ORDER — AMOXICILLIN-POT CLAVULANATE 875-125 MG PO TABS: 1.0000 | ORAL_TABLET | Freq: Two times a day (BID) | ORAL | Status: DC

## 2016-01-24 NOTE — Telephone Encounter (Signed)
The requested med has been sent to the pharmacy.  Please let the patient know. Thanks, WS 

## 2016-01-24 NOTE — Telephone Encounter (Signed)
Detailed message sent that rx sent to pharmacy.

## 2016-01-25 DIAGNOSIS — Z944 Liver transplant status: Secondary | ICD-10-CM | POA: Diagnosis not present

## 2016-01-30 ENCOUNTER — Other Ambulatory Visit: Payer: Self-pay

## 2016-01-30 ENCOUNTER — Ambulatory Visit (INDEPENDENT_AMBULATORY_CARE_PROVIDER_SITE_OTHER): Payer: Medicare Other | Admitting: Neurology

## 2016-01-30 ENCOUNTER — Other Ambulatory Visit: Payer: Medicare Other

## 2016-01-30 ENCOUNTER — Encounter: Payer: Self-pay | Admitting: Neurology

## 2016-01-30 VITALS — BP 122/70 | HR 102 | Resp 16 | Wt 149.0 lb

## 2016-01-30 DIAGNOSIS — T8649 Other complications of liver transplant: Secondary | ICD-10-CM | POA: Diagnosis not present

## 2016-01-30 DIAGNOSIS — K831 Obstruction of bile duct: Secondary | ICD-10-CM | POA: Diagnosis not present

## 2016-01-30 DIAGNOSIS — R569 Unspecified convulsions: Secondary | ICD-10-CM | POA: Diagnosis not present

## 2016-01-30 NOTE — Progress Notes (Signed)
NEUROLOGY FOLLOW UP OFFICE NOTE  RAESHAWN DEMETRIUS [id number]  HISTORY OF PRESENT ILLNESS: I had the pleasure of seeing Cassidy Barber in follow-up in the neurology clinic on 01/30/2016.  The patient was last seen 2 months ago for evaluation of continued need for Keppra after she had one seizure during her hospitalization for liver transplant in January 2015. EEG showed diffuse slowing. MRI showed symmetric signal changes in the posterior more than anterior centrum semiovale, major forcepses, posterior corpus callosum and periventricular white matter of the temporal horn of the lateral ventricles, which might represent osmotic, metabolic, or toxic abnormality versus infection. Specific considerations included tacrolimus toxicity, hyperbilirubinemia, atypical for PRES. I personally reviewed repeat MRI brain with and without contrast done 12/09/15 which showed T2 FLAIR signal hyperintensity in the splenium of the corpus callosum extending into the periventricular white matter of the parietal occipital lobes, no abnormal enhancement or restricted diffusion. This appears improved from previous scan. Her wake and sleep EEG was normal. Since she has had only one seizure in the setting of abnormal MRI brain post-transplant, we agreed to start tapering off Keppra. She has been off medication for more than a month with no seizures or seizure-like symptoms. She has been having head congestion for the past 2 weeks and will be seeing her doctor tomorrow. She denies any dizziness, diplopia, focal numbness/tingling/weakness, olfactory/gustatory hallucinations, myoclonic jerks. She denies any falls.   HPI: This is a pleasant 65 yo RH woman with a history of hypertension, hypothyroidism, liver cirrhosis s/p liver transplant in January 2015, who had a seizure during her hospitalization for the transplant. Per records reviewed from [facility], she began mumbling, became unresponsive, eyes rolled back, then both arms started shaking. Her  hospital course was complicated by encephalopathy believed to be hepatic in nature, and she was also on Tacrolimus at the time of the seizure. She had an EEG which was abnormal due to background slowing and FIRDA, indicating moderate encephalopathy, no epileptiform discharges seen. Her MRI brain without contrast was abnormal, showing symmetric signal abnormality involving the posterior more than anterior centrum semiovale, major forcepses, the posterior corpus callosum and the periventricular white matter of the temporal horn of the lateral ventricles. This might represent osmotic, metabolic, or toxic abnormality versus infection. Specific considerations include tacrolimus toxicity, hyperbilirubinemia in the setting of hepatic encephalopathy. The appearance would be atypical for PRES. Recommend short interval followup. She reports that she has not had a follow-up MRI brain. She was discharged home on Keppra 1000mg  BID. She had seen Neurology in November 2015 after her hospital stay, and tapering of Keppra was discussed, however she declined at that time due to concern for repeat seizures. She denies any prior history of seizures. No seizures since January 2015. She denies any staring/unresponsive episodes, gaps in time, olfactory/gustatory hallucinations, deja vu, rising epigastric sensation, focal numbness/tingling/weakness, myoclonic jerks. Memory is good. She denies any headaches, dizziness, vision changes, dysarthria, dysphagia, neck/back pain. She has chronic diarrhea 3-4 times a day. She had a normal birth and early development. There is no history of febrile convulsions, CNS infections such as meningitis/encephalitis, significant traumatic brain injury, neurosurgical procedures, or family history of seizures.  She is now interested in tapering off Keppra and had discussed this with her PCP. Keppra dose was decreased to 1000mg  in AM, 500mg  in PM. She reports doing well with taper and would like to  continue. She denies any side effects on Keppra.  PAST MEDICAL HISTORY: Past Medical History  Diagnosis Date  .  Liver disease   . Hernia of unspecified site of abdominal cavity without mention of obstruction or gangrene   . Thyroid disease   . Hypertension   . Primary biliary cirrhosis ([place]) 10/26/2013  . Anemia of chronic disease 10/26/2013    MEDICATIONS: Current Outpatient Prescriptions on File Prior to Visit  Medication Sig Dispense Refill  . acetaminophen (TYLENOL) 325 MG tablet Take 650 mg by mouth every 6 (six) hours as needed for mild pain or moderate pain.     [name] ALPRAZolam (XANAX) 0.5 MG tablet Take 1 tablet (0.5 mg total) by mouth at bedtime as needed. for sleep 30 tablet 1  . amoxicillin-clavulanate (AUGMENTIN) 875-125 MG tablet Take 1 tablet by mouth 2 (two) times daily. Take all of this medication 20 tablet 0  . aspirin 325 MG tablet Take 325 mg by mouth daily.    . Cholecalciferol (VITAMIN D3) 2000 UNITS capsule Take 1 capsule by mouth daily.    . cholestyramine (QUESTRAN) 4 G packet Take 1 packet (4 g total) by mouth 3 (three) times daily with meals. 60 each 12  . dicyclomine (BENTYL) 20 MG tablet Take 1 tablet (20 mg total) by mouth 3 (three) times daily before meals. (Patient taking differently: Take 20 mg by mouth 3 (three) times daily before meals. Takes as needed) 90 tablet 5  . diphenoxylate-atropine (LOMOTIL) 2.5-0.025 MG tablet Take 1 tablet by mouth 4 (four) times daily. Reported on 11/22/2015 90 tablet 2  . EVEROLIMUS PO Take 5 mg by mouth daily.  Take 3 mg in the AM and 2 mg in the PM    . ferrous sulfate 325 (65 FE) MG tablet Take 325 mg by mouth 2 (two) times daily with a meal.    . hydrOXYzine (VISTARIL) 25 MG capsule Take 25 mg by mouth 3 (three) times daily as needed.     [name] levothyroxine (SYNTHROID, LEVOTHROID) 150 MCG tablet Take 1 tablet (150 mcg total) by mouth daily. 90 tablet 1  . losartan (COZAAR) 25 MG tablet Take 1 tablet (25 mg total) by mouth daily.  90 tablet 1  . magnesium oxide (MAG-OX) 400 MG tablet Take 250 mg by mouth daily. Take 1 tablet by mouth daily.    . metoprolol succinate (TOPROL-XL) 25 MG 24 hr tablet Take 1 tablet (25 mg total) by mouth daily. 90 tablet 1  . mometasone (NASONEX) 50 MCG/ACT nasal spray Place 2 sprays into the nose daily. 17 g 12  . mupirocin ointment (BACTROBAN) 2 % Apply 1 application topically as needed. Reported on 11/30/2015  1  . pantoprazole (PROTONIX) 40 MG tablet Take 1 tablet (40 mg total) by mouth daily before breakfast. 90 tablet 2  . potassium chloride SA (K-DUR,KLOR-CON) 20 MEQ tablet Take 1 tablet (20 mEq total) by mouth daily. 30 tablet 5  . tacrolimus (PROGRAF) 1 MG capsule Take 2 mg by mouth 2 (two) times daily.     No current facility-administered medications on file prior to visit.    ALLERGIES: Allergies  Allergen Reactions  . Codeine Nausea Only  . Oxycodone Nausea Only  . Cephalexin Swelling    Swelling of arms, face, hands, per patient finished rest of bottle. Tolerated zosyn    FAMILY HISTORY: Family History  Problem Relation Age of Onset  . Prostate cancer Father   . Colon cancer Sister   . Healthy Son     SOCIAL HISTORY: Social History   Social History  . Marital Status: Married    Spouse Name:  N/A  . Number of Children: 1  . Years of Education: N/A   Occupational History  . Not on file.   Social History Main Topics  . Smoking status: Never Smoker   . Smokeless tobacco: Never Used  . Alcohol Use: No  . Drug Use: No  . Sexual Activity: Yes    Birth Control/ Protection: None   Other Topics Concern  . Not on file   Social History Narrative    REVIEW OF SYSTEMS: Constitutional: No fevers, chills, or sweats, no generalized fatigue, change in appetite Eyes: No visual changes, double vision, eye pain Ear, nose and throat: No hearing loss, ear pain, nasal congestion, sore throat Cardiovascular: No chest pain, palpitations Respiratory:  No shortness of  breath at rest or with exertion, wheezes GastrointestinaI: No nausea, vomiting, diarrhea, abdominal pain, fecal incontinence Genitourinary:  No dysuria, urinary retention or frequency Musculoskeletal:  No neck pain, back pain Integumentary: No rash, pruritus, skin lesions Neurological: as above Psychiatric: No depression, insomnia, anxiety Endocrine: No palpitations, fatigue, diaphoresis, mood swings, change in appetite, change in weight, increased thirst Hematologic/Lymphatic:  No anemia, purpura, petechiae. Allergic/Immunologic: no itchy/runny eyes, nasal congestion, recent allergic reactions, rashes  PHYSICAL EXAM: Filed Vitals:   01/30/16 1129  BP: 122/70  Pulse: 102  Resp: 16   General: No acute distress Head:  Normocephalic/atraumatic Neck: supple, no paraspinal tenderness, full range of motion Heart:  Regular rate and rhythm Lungs:  Clear to auscultation bilaterally Back: No paraspinal tenderness Skin/Extremities: No rash, no edema Neurological Exam: alert and oriented to person, place, and time. No aphasia or dysarthria. Fund of knowledge is appropriate.  Recent and remote memory are intact.  Attention and concentration are normal.    Able to name objects and repeat phrases. Cranial nerves: Pupils equal, round, reactive to light. Extraocular movements intact with no nystagmus. Visual fields full. Facial sensation intact. No facial asymmetry. Tongue, uvula, palate midline.  Motor: Bulk and tone normal, muscle strength 5/5 throughout with no pronator drift.  Sensation to light touch intact.  No extinction to double simultaneous stimulation.  Deep tendon reflexes 2+ throughout, toes downgoing.  Finger to nose testing intact.  Gait narrow-based and steady, able to tandem walk adequately.  Romberg negative.  IMPRESSION: This is a pleasant 65 yo RH woman with a history of hypertension, hypothyroidism, liver cirrhosis s/p liver transplant in January 2015, with a single seizure during the  time of her hospitalization while she was encephalopathic (hyperbilirubinemia s/p transplant and on tacrolimus). She has been seizure-free since the provoked seizure, MRI brain shows improvement from prior scan. Routine EEG normal. She has been off Keppra for a month with no seizure recurrence. We again discussed holding off on driving 6 months from medication taper. Continue to monitor symptoms off medication. She knows to call our office for any changes and will follow-up in 1 year or earlier if needed.   Thank you for allowing me to participate in her care.  Please do not hesitate to call for any questions or concerns.  The duration of this appointment visit was 25 minutes of face-to-face time with the patient.  Greater than 50% of this time was spent in counseling, explanation of diagnosis, planning of further management, and coordination of care.   Ellouise Newer, M.D.   CC: Dr. Livia Snellen

## 2016-01-30 NOTE — Patient Instructions (Signed)
1. You look great, continue to monitor symptoms 2. Recommend no driving 6 months after stopping seizure medication 3. Follow-up in 1 year, call for any change in symptoms

## 2016-01-31 DIAGNOSIS — J3089 Other allergic rhinitis: Secondary | ICD-10-CM | POA: Diagnosis not present

## 2016-01-31 DIAGNOSIS — H6533 Chronic mucoid otitis media, bilateral: Secondary | ICD-10-CM | POA: Diagnosis not present

## 2016-01-31 LAB — CBC WITH DIFFERENTIAL/PLATELET
BASOS ABS: 0.2 10*3/uL (ref 0.0–0.2)
BASOS: 2 %
EOS (ABSOLUTE): 0.8 10*3/uL — ABNORMAL HIGH (ref 0.0–0.4)
Eos: 8 %
HEMOGLOBIN: 10.6 g/dL — AB (ref 11.1–15.9)
Hematocrit: 34.2 % (ref 34.0–46.6)
Immature Grans (Abs): 0 10*3/uL (ref 0.0–0.1)
Immature Granulocytes: 0 %
LYMPHS ABS: 3.2 10*3/uL — AB (ref 0.7–3.1)
Lymphs: 33 %
MCH: 26.4 pg — AB (ref 26.6–33.0)
MCHC: 31 g/dL — AB (ref 31.5–35.7)
MCV: 85 fL (ref 79–97)
MONOCYTES: 14 %
Monocytes Absolute: 1.3 10*3/uL — ABNORMAL HIGH (ref 0.1–0.9)
NEUTROS ABS: 4.2 10*3/uL (ref 1.4–7.0)
Neutrophils: 43 %
Platelets: 431 10*3/uL — ABNORMAL HIGH (ref 150–379)
RBC: 4.02 x10E6/uL (ref 3.77–5.28)
RDW: 15.2 % (ref 12.3–15.4)
WBC: 9.7 10*3/uL (ref 3.4–10.8)

## 2016-01-31 LAB — COMPREHENSIVE METABOLIC PANEL
A/G RATIO: 1.5 (ref 1.2–2.2)
ALBUMIN: 3.5 g/dL — AB (ref 3.6–4.8)
ALK PHOS: 246 IU/L — AB (ref 39–117)
ALT: 32 IU/L (ref 0–32)
AST: 37 IU/L (ref 0–40)
BUN / CREAT RATIO: 16 (ref 11–26)
BUN: 13 mg/dL (ref 8–27)
CHLORIDE: 102 mmol/L (ref 96–106)
CO2: 24 mmol/L (ref 18–29)
Calcium: 8.3 mg/dL — ABNORMAL LOW (ref 8.7–10.3)
Creatinine, Ser: 0.82 mg/dL (ref 0.57–1.00)
GFR calc non Af Amer: 76 mL/min/{1.73_m2} (ref >59)
GFR, EST AFRICAN AMERICAN: 87 mL/min/{1.73_m2} (ref >59)
GLOBULIN, TOTAL: 2.3 g/dL (ref 1.5–4.5)
GLUCOSE: 87 mg/dL (ref 65–99)
POTASSIUM: 4.5 mmol/L (ref 3.5–5.2)
SODIUM: 141 mmol/L (ref 134–144)
Total Protein: 5.8 g/dL — ABNORMAL LOW (ref 6.0–8.5)

## 2016-01-31 LAB — EVEROLIMUS: EVEROLIMUS: 9.9 ng/mL — AB (ref 3.0–8.0)

## 2016-01-31 LAB — TACROLIMUS LEVEL: Tacrolimus Lvl: 7.1 ng/mL (ref 2.0–20.0)

## 2016-02-20 ENCOUNTER — Other Ambulatory Visit: Payer: Medicare Other

## 2016-02-20 ENCOUNTER — Other Ambulatory Visit: Payer: Self-pay

## 2016-02-20 DIAGNOSIS — D899 Disorder involving the immune mechanism, unspecified: Secondary | ICD-10-CM | POA: Diagnosis not present

## 2016-02-20 DIAGNOSIS — Z944 Liver transplant status: Secondary | ICD-10-CM | POA: Diagnosis not present

## 2016-02-20 DIAGNOSIS — T8649 Other complications of liver transplant: Secondary | ICD-10-CM | POA: Diagnosis not present

## 2016-02-20 DIAGNOSIS — K831 Obstruction of bile duct: Secondary | ICD-10-CM | POA: Diagnosis not present

## 2016-02-22 LAB — CMP14+EGFR
ALK PHOS: 269 IU/L — AB (ref 39–117)
ALT: 25 IU/L (ref 0–32)
AST: 30 IU/L (ref 0–40)
Albumin/Globulin Ratio: 1.3 (ref 1.2–2.2)
Albumin: 3.5 g/dL — ABNORMAL LOW (ref 3.6–4.8)
BUN/Creatinine Ratio: 15 (ref 12–28)
BUN: 14 mg/dL (ref 8–27)
CHLORIDE: 103 mmol/L (ref 96–106)
CO2: 21 mmol/L (ref 18–29)
CREATININE: 0.93 mg/dL (ref 0.57–1.00)
Calcium: 8.4 mg/dL — ABNORMAL LOW (ref 8.7–10.3)
GFR calc Af Amer: 75 mL/min/{1.73_m2} (ref >59)
GFR calc non Af Amer: 65 mL/min/{1.73_m2} (ref >59)
GLUCOSE: 95 mg/dL (ref 65–99)
Globulin, Total: 2.6 g/dL (ref 1.5–4.5)
Potassium: 5 mmol/L (ref 3.5–5.2)
SODIUM: 140 mmol/L (ref 134–144)
Total Protein: 6.1 g/dL (ref 6.0–8.5)

## 2016-02-22 LAB — CBC WITH DIFFERENTIAL/PLATELET
BASOS ABS: 0.1 10*3/uL (ref 0.0–0.2)
Basos: 2 %
EOS (ABSOLUTE): 0.3 10*3/uL (ref 0.0–0.4)
EOS: 5 %
HEMATOCRIT: 33.6 % — AB (ref 34.0–46.6)
Hemoglobin: 10.3 g/dL — ABNORMAL LOW (ref 11.1–15.9)
IMMATURE GRANULOCYTES: 0 %
Immature Grans (Abs): 0 10*3/uL (ref 0.0–0.1)
LYMPHS ABS: 2.6 10*3/uL (ref 0.7–3.1)
Lymphs: 39 %
MCH: 27 pg (ref 26.6–33.0)
MCHC: 30.7 g/dL — AB (ref 31.5–35.7)
MCV: 88 fL (ref 79–97)
MONOCYTES: 18 %
MONOS ABS: 1.2 10*3/uL — AB (ref 0.1–0.9)
NEUTROS PCT: 36 %
Neutrophils Absolute: 2.4 10*3/uL (ref 1.4–7.0)
PLATELETS: 424 10*3/uL — AB (ref 150–379)
RBC: 3.82 x10E6/uL (ref 3.77–5.28)
RDW: 15.3 % (ref 12.3–15.4)
WBC: 6.6 10*3/uL (ref 3.4–10.8)

## 2016-02-22 LAB — EVEROLIMUS: EVEROLIMUS: 8.1 ng/mL — AB (ref 3.0–8.0)

## 2016-02-22 LAB — TACROLIMUS LEVEL: TACROLIMUS LVL: 3.4 ng/mL (ref 2.0–20.0)

## 2016-03-01 DIAGNOSIS — Z944 Liver transplant status: Secondary | ICD-10-CM | POA: Diagnosis not present

## 2016-03-09 DIAGNOSIS — D485 Neoplasm of uncertain behavior of skin: Secondary | ICD-10-CM | POA: Diagnosis not present

## 2016-03-09 DIAGNOSIS — C44529 Squamous cell carcinoma of skin of other part of trunk: Secondary | ICD-10-CM | POA: Diagnosis not present

## 2016-03-09 DIAGNOSIS — C44329 Squamous cell carcinoma of skin of other parts of face: Secondary | ICD-10-CM | POA: Diagnosis not present

## 2016-03-09 DIAGNOSIS — L720 Epidermal cyst: Secondary | ICD-10-CM | POA: Diagnosis not present

## 2016-03-09 DIAGNOSIS — D239 Other benign neoplasm of skin, unspecified: Secondary | ICD-10-CM | POA: Diagnosis not present

## 2016-03-23 DIAGNOSIS — Z944 Liver transplant status: Secondary | ICD-10-CM | POA: Diagnosis not present

## 2016-03-26 DIAGNOSIS — Z944 Liver transplant status: Secondary | ICD-10-CM | POA: Diagnosis not present

## 2016-03-26 DIAGNOSIS — D899 Disorder involving the immune mechanism, unspecified: Secondary | ICD-10-CM | POA: Diagnosis not present

## 2016-03-26 DIAGNOSIS — Z949 Transplanted organ and tissue status, unspecified: Secondary | ICD-10-CM | POA: Diagnosis not present

## 2016-03-27 ENCOUNTER — Ambulatory Visit (INDEPENDENT_AMBULATORY_CARE_PROVIDER_SITE_OTHER): Payer: Medicare Other | Admitting: Internal Medicine

## 2016-03-27 ENCOUNTER — Encounter (INDEPENDENT_AMBULATORY_CARE_PROVIDER_SITE_OTHER): Payer: Self-pay | Admitting: Internal Medicine

## 2016-03-27 VITALS — BP 130/80 | HR 64 | Temp 97.7°F | Resp 18 | Ht 66.0 in | Wt 146.2 lb

## 2016-03-27 DIAGNOSIS — Z1509 Genetic susceptibility to other malignant neoplasm: Secondary | ICD-10-CM

## 2016-03-27 DIAGNOSIS — K529 Noninfective gastroenteritis and colitis, unspecified: Secondary | ICD-10-CM | POA: Diagnosis not present

## 2016-03-27 DIAGNOSIS — Z944 Liver transplant status: Secondary | ICD-10-CM | POA: Diagnosis not present

## 2016-03-27 MED ORDER — CHOLESTYRAMINE 4 G PO PACK: 2.0000 g | PACK | Freq: Two times a day (BID) | ORAL | Status: DC

## 2016-03-27 NOTE — Patient Instructions (Signed)
Flexible sigmoidoscopy to be scheduled in first or second week of July 2017

## 2016-03-27 NOTE — Progress Notes (Signed)
Presenting complaint;  Follow-up for chronic diarrhea. History of colon cancer/Lynne syndrome.  Subjective:  Patient is 65 year old Caucasian female who is here for scheduled visit. She remains with diarrhea. She has anywhere from 4-6 stools per day. She is still having intermittent accidents. Eyes melena or rectal bleeding. However she rarely has a bowel movement at night. She is not taking cholestyramine. She takes diphenoxylate every day and Imodium on days when she has to leave house. She has occasional pain in left upper quadrant of abdomen. She has very good appetite. She has lost 2 pounds in the last 4 months. He tells me her alkaline phosphatase was mildly elevated and she is scheduled to undergo MRCP. She has an appointment with Dr. Merrilee J[facility] at [facility] after studies completed. There is concern for anastomotic bile duct stricture or recurrence of PBC and transplanted liver. She had LDLT at [facility] in January 2015. She had flexible sigmoidoscopy in December 2016 and exam was unremarkable prep was suboptimal. He was advised to have flexible sigmoidoscopy in 6 months.    Current Medications: Outpatient Encounter Prescriptions as of 03/27/2016  Medication Sig  . acetaminophen (TYLENOL) 325 MG tablet Take 650 mg by mouth every 6 (six) hours as needed for mild pain or moderate pain.   [name] ALPRAZolam (XANAX) 0.5 MG tablet Take 1 tablet (0.5 mg total) by mouth at bedtime as needed. for sleep  . aspirin 325 MG tablet Take 325 mg by mouth daily.  . Cholecalciferol (VITAMIN D3) 2000 UNITS capsule Take 1 capsule by mouth daily.  . diphenoxylate-atropine (LOMOTIL) 2.5-0.025 MG tablet Take 1 tablet by mouth 4 (four) times daily. Reported on 11/22/2015  . EVEROLIMUS PO Takes 6 Po in the morning , 4 po at night.  . ferrous sulfate 325 (65 FE) MG tablet Take 325 mg by mouth 2 (two) times daily with a meal.  . hydrOXYzine (VISTARIL) 25 MG capsule Take 25 mg by mouth 3 (three) times daily as needed.   [name]  levothyroxine (SYNTHROID, LEVOTHROID) 150 MCG tablet Take 1 tablet (150 mcg total) by mouth daily.  [name] loperamide (IMODIUM) 2 MG capsule Take 2 mg by mouth. Patient states that she takes on days that she is not at home. May be 2-4 per day.  . losartan (COZAAR) 25 MG tablet Take 1 tablet (25 mg total) by mouth daily.  . magnesium oxide (MAG-OX) 400 MG tablet Take 250 mg by mouth daily. Take 1 tablet by mouth daily.  . metoprolol succinate (TOPROL-XL) 25 MG 24 hr tablet Take 1 tablet (25 mg total) by mouth daily.  . mometasone (NASONEX) 50 MCG/ACT nasal spray Place 2 sprays into the nose daily. (Patient taking differently: Place 2 sprays into the nose daily. AS NEEDED)  . mupirocin ointment (BACTROBAN) 2 % Apply 1 application topically as needed. Reported on 11/30/2015  . pantoprazole (PROTONIX) 40 MG tablet Take 1 tablet (40 mg total) by mouth daily before breakfast.  . potassium chloride SA (K-DUR,KLOR-CON) 20 MEQ tablet Take 1 tablet (20 mEq total) by mouth daily.  . tacrolimus (PROGRAF) 1 MG capsule Take 2 mg by mouth 2 (two) times daily.  . cholestyramine (QUESTRAN) 4 G packet Take 1 packet (4 g total) by mouth 3 (three) times daily with meals. (Patient not taking: Reported on 03/27/2016)  . dicyclomine (BENTYL) 20 MG tablet Take 1 tablet (20 mg total) by mouth 3 (three) times daily before meals. (Patient not taking: Reported on 03/27/2016)  . [DISCONTINUED] amoxicillin-clavulanate (AUGMENTIN) 875-125 MG tablet Take 1  tablet by mouth 2 (two) times daily. Take all of this medication (Patient not taking: Reported on 03/27/2016)   No facility-administered encounter medications on file as of 03/27/2016.     Objective: Blood pressure 130/80, pulse 64, temperature 97.7 F (36.5 C), temperature source Oral, resp. rate 18, height 5\' 6"  (1.676 m), weight 146 lb 3.2 oz (66.316 kg). Patient is alert and in no acute distress. Conjunctiva is pink. Sclera is nonicteric Oropharyngeal mucosa is normal. No neck  masses or thyromegaly noted. Cardiac exam with regular rhythm normal S1 and S2. No murmur or gallop noted. Lungs are clear to auscultation. Abdomen is symmetrical with multiple scars and on palpation abdominal wall is very floppy and weak. Liver edge is easily palpable below right costal margin. No organomegaly or masses. No LE edema or clubbing noted.  Labs/studies Results: Lab data from 03/12/2016  WBC 7.3, H&H 10.9 and 35.7 and platelet count 441K.   BUN 12, creatinine 0.85 Bilirubin less than 0.2, AP 293, AST 35, ALT 24 and albumin 3.5. AP was 168 on 12/19/2015 AP was 246 on 01/30/2016 AP was 269 on 02/20/2016.   Assessment:  #1. Chronic diarrhea secondary to loss of most of her colon. She is requiring and had diarrhea chronically. #2. History of colon carcinoma and Lynch syndrome. Last sigmoidoscopy was in December 2016 and she would be due 1 next month or in July. She will need her preparation. #3. Mildly elevated alkaline phosphatase in a patient with history of liver transplant. She also has history of CBD stricture and is scheduled to undergo MRCP. She could also have recurrent PBC in transplanted liver. She is under care of Dr. Laurier Nancy of [facility].   Plan:  Patient advised to take diphenoxylate on schedule rather than when necessary basis. She should also take cholestyramine 2 g by mouth twice a day for a few weeks and see if it makes significant difference in frequency of her bowel movements away she can stop this medication. Patient advised that she should take it twice before after taking other medications. Flexible sigmoidoscopy within the next month or two. Physician visit in 6 months.

## 2016-03-29 ENCOUNTER — Encounter (INDEPENDENT_AMBULATORY_CARE_PROVIDER_SITE_OTHER): Payer: Self-pay | Admitting: *Deleted

## 2016-03-29 ENCOUNTER — Other Ambulatory Visit (INDEPENDENT_AMBULATORY_CARE_PROVIDER_SITE_OTHER): Payer: Self-pay | Admitting: *Deleted

## 2016-03-29 DIAGNOSIS — C189 Malignant neoplasm of colon, unspecified: Secondary | ICD-10-CM

## 2016-03-29 DIAGNOSIS — K743 Primary biliary cirrhosis: Secondary | ICD-10-CM

## 2016-03-29 DIAGNOSIS — R109 Unspecified abdominal pain: Secondary | ICD-10-CM

## 2016-03-29 DIAGNOSIS — K7682 Hepatic encephalopathy: Secondary | ICD-10-CM

## 2016-03-29 DIAGNOSIS — K7290 Hepatic failure, unspecified without coma: Secondary | ICD-10-CM

## 2016-04-02 ENCOUNTER — Ambulatory Visit (INDEPENDENT_AMBULATORY_CARE_PROVIDER_SITE_OTHER): Payer: Medicare Other | Admitting: Family Medicine

## 2016-04-02 ENCOUNTER — Encounter: Payer: Self-pay | Admitting: Family Medicine

## 2016-04-02 VITALS — BP 133/66 | HR 75 | Temp 97.7°F | Ht 66.0 in | Wt 149.6 lb

## 2016-04-02 DIAGNOSIS — J329 Chronic sinusitis, unspecified: Secondary | ICD-10-CM | POA: Diagnosis not present

## 2016-04-02 DIAGNOSIS — J40 Bronchitis, not specified as acute or chronic: Secondary | ICD-10-CM | POA: Diagnosis not present

## 2016-04-02 MED ORDER — PSEUDOEPHEDRINE-GUAIFENESIN ER 60-600 MG PO TB12: 1.0000 | ORAL_TABLET | Freq: Two times a day (BID) | ORAL | Status: DC

## 2016-04-02 MED ORDER — HYDROCODONE-HOMATROPINE 5-1.5 MG/5ML PO SYRP: 5.0000 mL | ORAL_SOLUTION | Freq: Four times a day (QID) | ORAL | Status: DC | PRN

## 2016-04-02 MED ORDER — AMOXICILLIN-POT CLAVULANATE 875-125 MG PO TABS: 1.0000 | ORAL_TABLET | Freq: Two times a day (BID) | ORAL | Status: DC

## 2016-04-02 NOTE — Progress Notes (Signed)
Subjective:  Patient ID: Cassidy Barber, female    DOB: Nov 15, 1950  Age: 65 y.o. MRN: [id number]  CC: URI   HPI MCKAYLEE MANDELLA presents for Patient presents with upper respiratory congestion. Rhinorrhea that is frequently purulent. There is moderate sore throat. Patient reports coughing frequently as well.-colored/purulent sputum noted. There is no fever no chills no sweats. The patient denies being short of breath. Onset was 3-5 days ago. Gradually worsening in spite of home remedies.    History Janellie has a past medical history of Liver disease; Hernia of unspecified site of abdominal cavity without mention of obstruction or gangrene; Thyroid disease; Hypertension; Primary biliary cirrhosis ([place]) (10/26/2013); and Anemia of chronic disease (10/26/2013).   She has past surgical history that includes Abdominal hernia repair; Splenectomy (2006); Cholecystectomy (2007); Colonoscopy; Upper gastrointestinal endoscopy; Colon surgery (2008); Hernia repair; Liver transplant ([id number]); Colon resection; Abdominal hysterectomy; and Flexible sigmoidoscopy (N/A, 10/20/2015).   Her family history includes Colon cancer in her sister; Healthy in her son; Prostate cancer in her father.She reports that she has never smoked. She has never used smokeless tobacco. She reports that she does not drink alcohol or use illicit drugs.    ROS Review of Systems  Constitutional: Negative for fever, chills, activity change and appetite change.  HENT: Positive for congestion, postnasal drip, rhinorrhea and sinus pressure. Negative for ear discharge, ear pain, hearing loss, nosebleeds, sneezing and trouble swallowing.   Respiratory: Negative for chest tightness and shortness of breath.   Cardiovascular: Negative for chest pain and palpitations.  Skin: Negative for rash.    Objective:  BP 133/66 mmHg  Pulse 75  Temp(Src) 97.7 F (36.5 C) (Oral)  Ht 5\' 6"  (1.676 m)  Wt 149 lb 9.6 oz (67.858 kg)  BMI 24.16 kg/m2   SpO2 100%  BP Readings from Last 3 Encounters:  04/02/16 133/66  03/27/16 130/80  01/30/16 122/70    Wt Readings from Last 3 Encounters:  04/02/16 149 lb 9.6 oz (67.858 kg)  03/27/16 146 lb 3.2 oz (66.316 kg)  01/30/16 149 lb (67.586 kg)     Physical Exam  Constitutional: She appears well-developed and well-nourished.  HENT:  Head: Normocephalic and atraumatic.  Right Ear: Tympanic membrane and external ear normal. No decreased hearing is noted.  Left Ear: Tympanic membrane and external ear normal. No decreased hearing is noted.  Nose: Mucosal edema present. Right sinus exhibits maxillary sinus tenderness. Right sinus exhibits no frontal sinus tenderness. Left sinus exhibits maxillary sinus tenderness. Left sinus exhibits no frontal sinus tenderness.  Mouth/Throat: No oropharyngeal exudate or posterior oropharyngeal erythema.  Neck: No Brudzinski's sign noted.  Pulmonary/Chest: Breath sounds normal. No respiratory distress.  Lymphadenopathy:       Head (right side): No preauricular adenopathy present.       Head (left side): No preauricular adenopathy present.       Right cervical: No superficial cervical adenopathy present.      Left cervical: No superficial cervical adenopathy present.     Lab Results  Component Value Date   WBC 6.6 02/20/2016   HGB 9.9* 02/21/2015   HCT 33.6* 02/20/2016   PLT 424* 02/20/2016   GLUCOSE 95 02/20/2016   CHOL 156 07/05/2014   TRIG 116 07/05/2014   HDL 60 07/05/2014   LDLCALC 73 07/05/2014   ALT 25 02/20/2016   AST 30 02/20/2016   NA 140 02/20/2016   K 5.0 02/20/2016   CL 103 02/20/2016   CREATININE 0.93 02/20/2016   BUN  14 02/20/2016   CO2 21 02/20/2016   TSH 3.750 10/10/2015   INR 1.1 06/07/2014    No results found.  Assessment & Plan:   Maureena was seen today for uri.  Diagnoses and all orders for this visit:  Sinobronchitis  Other orders -     amoxicillin-clavulanate (AUGMENTIN) 875-125 MG tablet; Take 1 tablet by  mouth 2 (two) times daily. Take all of this medication -     pseudoephedrine-guaifenesin (MUCINEX D) 60-600 MG 12 hr tablet; Take 1 tablet by mouth every 12 (twelve) hours. As needed for congestion -     HYDROcodone-homatropine (HYCODAN) 5-1.5 MG/5ML syrup; Take 5 mLs by mouth every 6 (six) hours as needed for cough.      I am having Ms. Sedberry start on amoxicillin-clavulanate, pseudoephedrine-guaifenesin, and HYDROcodone-homatropine. I am also having her maintain her acetaminophen, aspirin, hydrOXYzine, ferrous sulfate, tacrolimus, EVEROLIMUS PO, magnesium oxide, Vitamin D3, mupirocin ointment, potassium chloride SA, levothyroxine, diphenoxylate-atropine, mometasone, metoprolol succinate, losartan, pantoprazole, ALPRAZolam, loperamide, cholestyramine, and PREVALITE.  Meds ordered this encounter  Medications  . PREVALITE 4 g packet    Sig: Take 4 g by mouth daily.     Refill:  11  . amoxicillin-clavulanate (AUGMENTIN) 875-125 MG tablet    Sig: Take 1 tablet by mouth 2 (two) times daily. Take all of this medication    Dispense:  20 tablet    Refill:  0  . pseudoephedrine-guaifenesin (MUCINEX D) 60-600 MG 12 hr tablet    Sig: Take 1 tablet by mouth every 12 (twelve) hours. As needed for congestion    Dispense:  20 tablet    Refill:  0  . HYDROcodone-homatropine (HYCODAN) 5-1.5 MG/5ML syrup    Sig: Take 5 mLs by mouth every 6 (six) hours as needed for cough.    Dispense:  120 mL    Refill:  0     Follow-up: Return if symptoms worsen or fail to improve.  Claretta Fraise, M.D.

## 2016-04-04 DIAGNOSIS — Z944 Liver transplant status: Secondary | ICD-10-CM | POA: Diagnosis not present

## 2016-04-04 DIAGNOSIS — K743 Primary biliary cirrhosis: Secondary | ICD-10-CM | POA: Diagnosis not present

## 2016-04-04 DIAGNOSIS — R7989 Other specified abnormal findings of blood chemistry: Secondary | ICD-10-CM | POA: Diagnosis not present

## 2016-04-04 DIAGNOSIS — K831 Obstruction of bile duct: Secondary | ICD-10-CM | POA: Diagnosis not present

## 2016-04-04 DIAGNOSIS — R945 Abnormal results of liver function studies: Secondary | ICD-10-CM | POA: Diagnosis not present

## 2016-04-05 DIAGNOSIS — C44629 Squamous cell carcinoma of skin of left upper limb, including shoulder: Secondary | ICD-10-CM | POA: Diagnosis not present

## 2016-04-05 DIAGNOSIS — C44622 Squamous cell carcinoma of skin of right upper limb, including shoulder: Secondary | ICD-10-CM | POA: Diagnosis not present

## 2016-04-05 DIAGNOSIS — D0462 Carcinoma in situ of skin of left upper limb, including shoulder: Secondary | ICD-10-CM | POA: Diagnosis not present

## 2016-04-05 DIAGNOSIS — L089 Local infection of the skin and subcutaneous tissue, unspecified: Secondary | ICD-10-CM | POA: Diagnosis not present

## 2016-04-05 DIAGNOSIS — C44329 Squamous cell carcinoma of skin of other parts of face: Secondary | ICD-10-CM | POA: Diagnosis not present

## 2016-04-05 DIAGNOSIS — L570 Actinic keratosis: Secondary | ICD-10-CM | POA: Diagnosis not present

## 2016-04-11 ENCOUNTER — Other Ambulatory Visit: Payer: Self-pay | Admitting: Family Medicine

## 2016-04-13 ENCOUNTER — Encounter: Payer: Self-pay | Admitting: Neurology

## 2016-04-16 ENCOUNTER — Encounter: Payer: Self-pay | Admitting: Family

## 2016-04-16 ENCOUNTER — Ambulatory Visit (INDEPENDENT_AMBULATORY_CARE_PROVIDER_SITE_OTHER): Payer: Medicare Other | Admitting: Family

## 2016-04-16 VITALS — BP 131/80 | HR 76 | Temp 97.5°F | Ht 66.0 in | Wt 145.8 lb

## 2016-04-16 DIAGNOSIS — J069 Acute upper respiratory infection, unspecified: Secondary | ICD-10-CM | POA: Diagnosis not present

## 2016-04-16 MED ORDER — METHYLPREDNISOLONE ACETATE 80 MG/ML IJ SUSP
80.0000 mg | Freq: Once | INTRAMUSCULAR | Status: AC
  Administered 2016-04-16: 80 mg via INTRAMUSCULAR

## 2016-04-16 MED ORDER — FLUTICASONE PROPIONATE 50 MCG/ACT NA SUSP: 2.0000 | Freq: Every day | NASAL | Status: DC

## 2016-04-16 NOTE — Patient Instructions (Signed)
Upper Respiratory Infection, Adult Most upper respiratory infections (URIs) are a viral infection of the air passages leading to the lungs. A URI affects the nose, throat, and upper air passages. The most common type of URI is nasopharyngitis and is typically referred to as "the common cold." URIs run their course and usually go away on their own. Most of the time, a URI does not require medical attention, but sometimes a bacterial infection in the upper airways can follow a viral infection. This is called a secondary infection. Sinus and middle ear infections are common types of secondary upper respiratory infections. Bacterial pneumonia can also complicate a URI. A URI can worsen asthma and chronic obstructive pulmonary disease (COPD). Sometimes, these complications can require emergency medical care and may be life threatening.  CAUSES Almost all URIs are caused by viruses. A virus is a type of germ and can spread from one person to another.  RISKS FACTORS You may be at risk for a URI if:   You smoke.   You have chronic heart or lung disease.  You have a weakened defense (immune) system.   You are very young or very old.   You have nasal allergies or asthma.  You work in crowded or poorly ventilated areas.  You work in health care facilities or schools. SIGNS AND SYMPTOMS  Symptoms typically develop 2-3 days after you come in contact with a cold virus. Most viral URIs last 7-10 days. However, viral URIs from the influenza virus (flu virus) can last 14-18 days and are typically more severe. Symptoms may include:   Runny or stuffy (congested) nose.   Sneezing.   Cough.   Sore throat.   Headache.   Fatigue.   Fever.   Loss of appetite.   Pain in your forehead, behind your eyes, and over your cheekbones (sinus pain).  Muscle aches.  DIAGNOSIS  Your health care provider may diagnose a URI by:  Physical exam.  Tests to check that your symptoms are not due to  another condition such as:  Strep throat.  Sinusitis.  Pneumonia.  Asthma. TREATMENT  A URI goes away on its own with time. It cannot be cured with medicines, but medicines may be prescribed or recommended to relieve symptoms. Medicines may help:  Reduce your fever.  Reduce your cough.  Relieve nasal congestion. HOME CARE INSTRUCTIONS   Take medicines only as directed by your health care provider.   Gargle warm saltwater or take cough drops to comfort your throat as directed by your health care provider.  Use a warm mist humidifier or inhale steam from a shower to increase air moisture. This may make it easier to breathe.  Drink enough fluid to keep your urine clear or pale yellow.   Eat soups and other clear broths and maintain good nutrition.   Rest as needed.   Return to work when your temperature has returned to normal or as your health care provider advises. You may need to stay home longer to avoid infecting others. You can also use a face mask and careful hand washing to prevent spread of the virus.  Increase the usage of your inhaler if you have asthma.   Do not use any tobacco products, including cigarettes, chewing tobacco, or electronic cigarettes. If you need help quitting, ask your health care provider. PREVENTION  The best way to protect yourself from getting a cold is to practice good hygiene.   Avoid oral or hand contact with people with cold   symptoms.   Wash your hands often if contact occurs.  There is no clear evidence that vitamin C, vitamin E, echinacea, or exercise reduces the chance of developing a cold. However, it is always recommended to get plenty of rest, exercise, and practice good nutrition.  SEEK MEDICAL CARE IF:   You are getting worse rather than better.   Your symptoms are not controlled by medicine.   You have chills.  You have worsening shortness of breath.  You have brown or red mucus.  You have yellow or brown nasal  discharge.  You have pain in your face, especially when you bend forward.  You have a fever.  You have swollen neck glands.  You have pain while swallowing.  You have white areas in the back of your throat. SEEK IMMEDIATE MEDICAL CARE IF:   You have severe or persistent:  Headache.  Ear pain.  Sinus pain.  Chest pain.  You have chronic lung disease and any of the following:  Wheezing.  Prolonged cough.  Coughing up blood.  A change in your usual mucus.  You have a stiff neck.  You have changes in your:  Vision.  Hearing.  Thinking.  Mood. MAKE SURE YOU:   Understand these instructions.  Will watch your condition.  Will get help right away if you are not doing well or get worse.   This information is not intended to replace advice given to you by your health care provider. Make sure you discuss any questions you have with your health care provider.   Document Released: 04/24/2001 Document Revised: 03/15/2015 Document Reviewed: 02/03/2014 Elsevier Interactive Patient Education 2016 [employer] Inc.  

## 2016-04-16 NOTE — Progress Notes (Signed)
   Subjective:    Patient ID: Cassidy Barber, female    DOB: 1951-03-06, 65 y.o.   MRN: [id number]  Sinus Problem This is a new problem. The current episode started more than 1 month ago. The problem has been waxing and waning since onset. There has been no fever. Her pain is at a severity of 0/10. She is experiencing no pain. Associated symptoms include congestion, coughing, a hoarse voice and sneezing. Pertinent negatives include no ear pain (ear congestion), headaches, shortness of breath, sinus pressure or sore throat. Past treatments include oral decongestants. The treatment provided mild relief.      Review of Systems  HENT: Positive for congestion, hoarse voice and sneezing. Negative for ear pain (ear congestion), sinus pressure and sore throat.   Respiratory: Positive for cough. Negative for shortness of breath.   Neurological: Negative for headaches.  All other systems reviewed and are negative.      Objective:   Physical Exam  Constitutional: She is oriented to person, place, and time. She appears well-developed and well-nourished. No distress.  HENT:  Head: Normocephalic and atraumatic.  Right Ear: Tympanic membrane is bulging.  Left Ear: Tympanic membrane is bulging.  Nose: Right sinus exhibits no maxillary sinus tenderness and no frontal sinus tenderness. Left sinus exhibits no maxillary sinus tenderness and no frontal sinus tenderness.  Mouth/Throat: Oropharynx is clear and moist.  Nasal passage erythemas with mild swelling    Eyes: Pupils are equal, round, and reactive to light.  Neck: Normal range of motion. Neck supple. No thyromegaly present.  Cardiovascular: Normal rate, regular rhythm, normal heart sounds and intact distal pulses.   No murmur heard. Pulmonary/Chest: Effort normal and breath sounds normal. No respiratory distress. She has no wheezes.  Abdominal: Soft. Bowel sounds are normal. She exhibits no distension. There is no tenderness.  Musculoskeletal:  Normal range of motion. She exhibits no edema or tenderness.  Neurological: She is alert and oriented to person, place, and time.  Skin: Skin is warm and dry.  Psychiatric: She has a normal mood and affect. Her behavior is normal. Judgment and thought content normal.  Vitals reviewed.     BP 131/80 mmHg  Pulse 76  Temp(Src) 97.5 F (36.4 C) (Oral)  Ht 5\' 6"  (1.676 m)  Wt 145 lb 12.8 oz (66.134 kg)  BMI 23.54 kg/m2     Assessment & Plan:  1. Acute upper respiratory infection -- Take meds as prescribed - Use a cool mist humidifier  -Use saline nose sprays frequently -Saline irrigations of the nose can be very helpful if done frequently.  * 4X daily for 1 week*  * Use of a nettie pot can be helpful with this. Follow directions with this* -Force fluids -For any cough or congestion  Use plain Mucinex- regular strength or max strength is fine   * Children- consult with Pharmacist for dosing -For fever or aces or pains- take tylenol or ibuprofen appropriate for age and weight.  * for fevers greater than 101 orally you may alternate ibuprofen and tylenol every  3 hours. -Throat lozenges if help -New toothbr - methylPREDNISolone acetate (DEPO-MEDROL) injection 80 mg; Inject 1 mL (80 mg total) into the muscle once. - fluticasone (FLONASE) 50 MCG/ACT nasal spray; Place 2 sprays into both nostrils daily.  Dispense: 16 g; Refill: [place], FNP

## 2016-04-20 DIAGNOSIS — Z944 Liver transplant status: Secondary | ICD-10-CM | POA: Diagnosis not present

## 2016-04-20 DIAGNOSIS — C189 Malignant neoplasm of colon, unspecified: Secondary | ICD-10-CM | POA: Diagnosis not present

## 2016-04-20 DIAGNOSIS — K743 Primary biliary cirrhosis: Secondary | ICD-10-CM | POA: Diagnosis not present

## 2016-04-20 DIAGNOSIS — D899 Disorder involving the immune mechanism, unspecified: Secondary | ICD-10-CM | POA: Diagnosis not present

## 2016-04-20 DIAGNOSIS — R109 Unspecified abdominal pain: Secondary | ICD-10-CM | POA: Diagnosis not present

## 2016-04-20 DIAGNOSIS — K7290 Hepatic failure, unspecified without coma: Secondary | ICD-10-CM | POA: Diagnosis not present

## 2016-04-21 LAB — CEA: CEA: 0.9 ng/mL

## 2016-04-23 ENCOUNTER — Telehealth (INDEPENDENT_AMBULATORY_CARE_PROVIDER_SITE_OTHER): Payer: Self-pay | Admitting: *Deleted

## 2016-04-23 DIAGNOSIS — K7682 Hepatic encephalopathy: Secondary | ICD-10-CM

## 2016-04-23 DIAGNOSIS — C189 Malignant neoplasm of colon, unspecified: Secondary | ICD-10-CM

## 2016-04-23 DIAGNOSIS — K743 Primary biliary cirrhosis: Secondary | ICD-10-CM

## 2016-04-23 DIAGNOSIS — K7290 Hepatic failure, unspecified without coma: Secondary | ICD-10-CM

## 2016-04-23 NOTE — Telephone Encounter (Signed)
Per Dr.Rehman the patient will need to have labs drawn in 6 months. 

## 2016-04-26 DIAGNOSIS — C44529 Squamous cell carcinoma of skin of other part of trunk: Secondary | ICD-10-CM | POA: Diagnosis not present

## 2016-05-02 DIAGNOSIS — H6533 Chronic mucoid otitis media, bilateral: Secondary | ICD-10-CM | POA: Diagnosis not present

## 2016-05-02 DIAGNOSIS — Z944 Liver transplant status: Secondary | ICD-10-CM | POA: Diagnosis not present

## 2016-05-08 ENCOUNTER — Other Ambulatory Visit: Payer: Self-pay | Admitting: Family Medicine

## 2016-05-08 DIAGNOSIS — Z944 Liver transplant status: Secondary | ICD-10-CM | POA: Diagnosis not present

## 2016-05-10 DIAGNOSIS — C44629 Squamous cell carcinoma of skin of left upper limb, including shoulder: Secondary | ICD-10-CM | POA: Diagnosis not present

## 2016-05-10 DIAGNOSIS — L988 Other specified disorders of the skin and subcutaneous tissue: Secondary | ICD-10-CM | POA: Diagnosis not present

## 2016-05-22 DIAGNOSIS — Z949 Transplanted organ and tissue status, unspecified: Secondary | ICD-10-CM | POA: Diagnosis not present

## 2016-05-22 DIAGNOSIS — D899 Disorder involving the immune mechanism, unspecified: Secondary | ICD-10-CM | POA: Diagnosis not present

## 2016-05-22 DIAGNOSIS — Z944 Liver transplant status: Secondary | ICD-10-CM | POA: Diagnosis not present

## 2016-05-23 ENCOUNTER — Other Ambulatory Visit (INDEPENDENT_AMBULATORY_CARE_PROVIDER_SITE_OTHER): Payer: Self-pay | Admitting: Internal Medicine

## 2016-05-23 ENCOUNTER — Other Ambulatory Visit (INDEPENDENT_AMBULATORY_CARE_PROVIDER_SITE_OTHER): Payer: Self-pay | Admitting: *Deleted

## 2016-05-23 ENCOUNTER — Encounter (INDEPENDENT_AMBULATORY_CARE_PROVIDER_SITE_OTHER): Payer: Self-pay | Admitting: *Deleted

## 2016-05-23 DIAGNOSIS — Z1509 Genetic susceptibility to other malignant neoplasm: Secondary | ICD-10-CM

## 2016-05-23 DIAGNOSIS — Z85038 Personal history of other malignant neoplasm of large intestine: Secondary | ICD-10-CM

## 2016-05-23 MED ORDER — SUPREP BOWEL PREP KIT 17.5-3.13-1.6 GM/177ML PO SOLN: 1.0000 | Freq: Once | ORAL | Status: DC

## 2016-05-23 NOTE — Telephone Encounter (Signed)
Patient needs suprep 

## 2016-05-24 DIAGNOSIS — C44329 Squamous cell carcinoma of skin of other parts of face: Secondary | ICD-10-CM | POA: Diagnosis not present

## 2016-05-24 DIAGNOSIS — L570 Actinic keratosis: Secondary | ICD-10-CM | POA: Diagnosis not present

## 2016-05-24 DIAGNOSIS — C44321 Squamous cell carcinoma of skin of nose: Secondary | ICD-10-CM | POA: Diagnosis not present

## 2016-05-29 DIAGNOSIS — D899 Disorder involving the immune mechanism, unspecified: Secondary | ICD-10-CM | POA: Diagnosis not present

## 2016-05-29 DIAGNOSIS — Z85828 Personal history of other malignant neoplasm of skin: Secondary | ICD-10-CM | POA: Diagnosis not present

## 2016-05-29 DIAGNOSIS — Z944 Liver transplant status: Secondary | ICD-10-CM | POA: Diagnosis not present

## 2016-05-30 ENCOUNTER — Other Ambulatory Visit: Payer: Self-pay | Admitting: Family

## 2016-06-05 DIAGNOSIS — H524 Presbyopia: Secondary | ICD-10-CM | POA: Diagnosis not present

## 2016-06-12 DIAGNOSIS — Z944 Liver transplant status: Secondary | ICD-10-CM | POA: Diagnosis not present

## 2016-06-25 DIAGNOSIS — Z944 Liver transplant status: Secondary | ICD-10-CM | POA: Diagnosis not present

## 2016-07-11 ENCOUNTER — Ambulatory Visit (HOSPITAL_COMMUNITY)
Admission: RE | Admit: 2016-07-11 | Discharge: 2016-07-11 | Disposition: A | Discharge status: Home / Self Care | Payer: Medicare Other | Source: Ambulatory Visit | Attending: Internal Medicine | Admitting: Internal Medicine

## 2016-07-11 ENCOUNTER — Encounter (HOSPITAL_COMMUNITY)
Admission: RE | Disposition: A | Discharge status: Home / Self Care | Payer: Self-pay | Source: Ambulatory Visit | Attending: Internal Medicine

## 2016-07-11 ENCOUNTER — Encounter (HOSPITAL_COMMUNITY): Payer: Self-pay | Admitting: *Deleted

## 2016-07-11 DIAGNOSIS — K644 Residual hemorrhoidal skin tags: Secondary | ICD-10-CM | POA: Diagnosis not present

## 2016-07-11 DIAGNOSIS — Z1509 Genetic susceptibility to other malignant neoplasm: Secondary | ICD-10-CM | POA: Diagnosis not present

## 2016-07-11 DIAGNOSIS — Z85038 Personal history of other malignant neoplasm of large intestine: Secondary | ICD-10-CM | POA: Insufficient documentation

## 2016-07-11 DIAGNOSIS — Z79899 Other long term (current) drug therapy: Secondary | ICD-10-CM | POA: Insufficient documentation

## 2016-07-11 DIAGNOSIS — K6289 Other specified diseases of anus and rectum: Secondary | ICD-10-CM | POA: Insufficient documentation

## 2016-07-11 DIAGNOSIS — Z7951 Long term (current) use of inhaled steroids: Secondary | ICD-10-CM | POA: Diagnosis not present

## 2016-07-11 DIAGNOSIS — K6389 Other specified diseases of intestine: Secondary | ICD-10-CM | POA: Insufficient documentation

## 2016-07-11 DIAGNOSIS — Z944 Liver transplant status: Secondary | ICD-10-CM | POA: Diagnosis not present

## 2016-07-11 DIAGNOSIS — I10 Essential (primary) hypertension: Secondary | ICD-10-CM | POA: Diagnosis not present

## 2016-07-11 DIAGNOSIS — Z1211 Encounter for screening for malignant neoplasm of colon: Secondary | ICD-10-CM | POA: Diagnosis not present

## 2016-07-11 DIAGNOSIS — Z7982 Long term (current) use of aspirin: Secondary | ICD-10-CM | POA: Insufficient documentation

## 2016-07-11 DIAGNOSIS — E079 Disorder of thyroid, unspecified: Secondary | ICD-10-CM | POA: Insufficient documentation

## 2016-07-11 DIAGNOSIS — Z9081 Acquired absence of spleen: Secondary | ICD-10-CM | POA: Diagnosis not present

## 2016-07-11 DIAGNOSIS — Z08 Encounter for follow-up examination after completed treatment for malignant neoplasm: Secondary | ICD-10-CM | POA: Diagnosis not present

## 2016-07-11 HISTORY — PX: FLEXIBLE SIGMOIDOSCOPY: SHX5431

## 2016-07-11 HISTORY — DX: Genetic susceptibility to other malignant neoplasm: Z15.09

## 2016-07-11 HISTORY — DX: Genetic susceptibility to malignant neoplasm of urinary tract: Z15.07

## 2016-07-11 SURGERY — SIGMOIDOSCOPY, FLEXIBLE
Anesthesia: Moderate Sedation

## 2016-07-11 MED ORDER — STERILE WATER FOR IRRIGATION IR SOLN
Status: DC | PRN
  Administered 2016-07-11: 2.5 mL

## 2016-07-11 MED ORDER — ONDANSETRON HCL 4 MG/2ML IJ SOLN
INTRAMUSCULAR | Status: AC
  Filled 2016-07-11: qty 2

## 2016-07-11 MED ORDER — SODIUM CHLORIDE 0.9 % IV SOLN
INTRAVENOUS | Status: DC
Start: 2016-07-11 — End: 2016-07-11
  Administered 2016-07-11: 13:00:00 via INTRAVENOUS

## 2016-07-11 MED ORDER — MEPERIDINE HCL 25 MG/ML IJ SOLN
INTRAMUSCULAR | Status: DC | PRN
  Administered 2016-07-11 (×2): 25 mg via INTRAVENOUS

## 2016-07-11 MED ORDER — MEPERIDINE HCL 100 MG/ML IJ SOLN
INTRAMUSCULAR | Status: AC
  Filled 2016-07-11: qty 2

## 2016-07-11 MED ORDER — MIDAZOLAM HCL 5 MG/5ML IJ SOLN
INTRAMUSCULAR | Status: AC
  Filled 2016-07-11: qty 10

## 2016-07-11 MED ORDER — MIDAZOLAM HCL 5 MG/5ML IJ SOLN
INTRAMUSCULAR | Status: DC | PRN
  Administered 2016-07-11: 2 mg via INTRAVENOUS
  Administered 2016-07-11 (×2): 3 mg via INTRAVENOUS

## 2016-07-11 NOTE — H&P (Signed)
Cassidy Barber is an 65 y.o. female.   Chief Complaint: Patient is here for flexible sigmoidoscopy. HPI: She is 65 year old Caucasian female who has history of colon carcinoma and Lynch syndrome is here for surveillance flexible sigmoidoscopy. She only has rectum and rim of sigmoid colon remaining. Last exam was in December 2016. She has 3-4 bowel movements on most days. She denies melena or rectal bleeding.  Past Medical History:  Diagnosis Date  . Anemia of chronic disease 10/26/2013  . Hernia of unspecified site of abdominal cavity without mention of obstruction or gangrene   . Hypertension   . Liver disease   . Lynch syndrome   . Primary biliary cirrhosis ([place]) 10/26/2013  . Thyroid disease     Past Surgical History:  Procedure Laterality Date  . ABDOMINAL HERNIA REPAIR     Patient's states that she has had 8- 9 hernia surgeries  . ABDOMINAL HYSTERECTOMY    . CHOLECYSTECTOMY  2007  . COLON RESECTION    . COLON SURGERY  2008   Done at [facility]  . COLONOSCOPY     Done at [facility]  . FLEXIBLE SIGMOIDOSCOPY N/A 10/20/2015   Procedure: FLEXIBLE SIGMOIDOSCOPY;  Surgeon: Rogene Houston, MD;  Location: [facility];  Service: Endoscopy;  Laterality: N/A;  1 - Dr Laural Golden has meeting until 1:00  . HERNIA REPAIR    . LIVER TRANSPLANT  [id number]  . SPLENECTOMY  2006  . UPPER GASTROINTESTINAL ENDOSCOPY     Done at [facility]    Family History  Problem Relation Age of Onset  . Prostate cancer Father   . Colon cancer Sister   . Healthy Son    Social History:  reports that she has never smoked. She has never used smokeless tobacco. She reports that she does not drink alcohol or use drugs.  Allergies:  Allergies  Allergen Reactions  . Codeine Nausea Only  . Cephalexin Swelling    Swelling of arms, face, hands, per patient finished rest of bottle. Tolerated zosyn    Medications Prior to Admission  Medication Sig Dispense Refill  . acetaminophen (TYLENOL) 325 MG tablet Take 650 mg by mouth every  6 (six) hours as needed for mild pain or moderate pain.     [name] ALPRAZolam (XANAX) 0.5 MG tablet Take 1 tablet (0.5 mg total) by mouth at bedtime as needed. for sleep 30 tablet 1  . aspirin 325 MG tablet Take 325 mg by mouth daily.    . Cholecalciferol (VITAMIN D3) 2000 UNITS capsule Take 1 capsule by mouth daily.    . cholestyramine (QUESTRAN) 4 g packet Take 0.5 packets (2 g total) by mouth 2 (two) times daily. 60 each 12  . diphenoxylate-atropine (LOMOTIL) 2.5-0.025 MG tablet Take 1 tablet by mouth 4 (four) times daily. Reported on 11/22/2015 90 tablet 2  . EVEROLIMUS PO 6 mg. Takes 6 Po in the morning , 4 po at night.    . ferrous sulfate 325 (65 FE) MG tablet Take 325 mg by mouth 2 (two) times daily with a meal.    . levothyroxine (SYNTHROID, LEVOTHROID) 150 MCG tablet Take 1 tablet (150 mcg total) by mouth daily. 90 tablet 1  . loperamide (IMODIUM) 2 MG capsule Take 2 mg by mouth. Patient states that she takes on days that she is not at home. May be 2-4 per day.    . losartan (COZAAR) 25 MG tablet Take 1 tablet (25 mg total) by mouth daily. 90 tablet 1  . magnesium  oxide (MAG-OX) 400 MG tablet Take 250 mg by mouth daily. Take 1 tablet by mouth daily.    . metoprolol succinate (TOPROL-XL) 25 MG 24 hr tablet TAKE ONE (1) TABLET EACH DAY 90 tablet 1  . pantoprazole (PROTONIX) 40 MG tablet Take 1 tablet (40 mg total) by mouth daily before breakfast. 90 tablet 2  . potassium chloride SA (K-DUR,KLOR-CON) 20 MEQ tablet TAKE ONE (1) TABLET EACH DAY 30 tablet 2  . PREVALITE 4 g packet Take 4 g by mouth daily.   11  . pseudoephedrine-guaifenesin (MUCINEX D) 60-600 MG 12 hr tablet TAKE 1 TABLET EVERY 12 HOURS AS NEEDED FOR CONGESTION 20 tablet 2  . SUPREP BOWEL PREP KIT 17.5-3.13-1.6 GM/180ML SOLN Take 1 kit by mouth once. 1 Bottle 0  . tacrolimus (PROGRAF) 1 MG capsule Take 2 mg by mouth 2 (two) times daily.    . fluticasone (FLONASE) 50 MCG/ACT nasal spray Place 2 sprays into both nostrils daily. 16 g 6   . HYDROcodone-homatropine (HYCODAN) 5-1.5 MG/5ML syrup Take 5 mLs by mouth every 6 (six) hours as needed for cough. 120 mL 0  . hydrOXYzine (VISTARIL) 25 MG capsule Take 25 mg by mouth 3 (three) times daily as needed.     . mometasone (NASONEX) 50 MCG/ACT nasal spray Place 2 sprays into the nose daily. (Patient taking differently: Place 2 sprays into the nose daily. AS NEEDED) 17 g 12  . mupirocin ointment (BACTROBAN) 2 % Apply 1 application topically as needed. Reported on 11/30/2015  1    No results found for this or any previous visit (from the past 48 hour(s)). No results found.  ROS  Blood pressure (!) 150/79, pulse 79, temperature 98.2 F (36.8 C), temperature source Oral, resp. rate (!) 22, height '5\' 3"'$  (1.6 m), weight 150 lb (68 kg), SpO2 98 %. Physical Exam  Constitutional: She appears well-developed and well-nourished.  HENT:  Mouth/Throat: Oropharynx is clear and moist.  Eyes: Conjunctivae are normal. No scleral icterus.  Neck: No thyromegaly present.  Cardiovascular: Normal rate, regular rhythm and normal heart sounds.   No murmur heard. Respiratory: Effort normal and breath sounds normal.  GI:  Abdomen is flat with long midline and right mid abdominal scar. Abdominal wall is thin but no hernia noted. Abdomen soft and nontender.  Musculoskeletal: She exhibits no edema.  Lymphadenopathy:    She has no cervical adenopathy.  Neurological: She is alert.  Skin: Skin is warm and dry.     Assessment/Plan History of CRC and Lynch syndrome. Surveillance flexible sigmoidoscopy.  Hildred Laser, MD 07/11/2016, 12:47 PM

## 2016-07-11 NOTE — Discharge Instructions (Signed)
Resume usual medications and diet. No driving for 24 hours. Next flexible sigmoidoscopy in one year.        Flexible Sigmoidoscopy, Care After Refer to this sheet in the next few weeks. These instructions provide you with information on caring for yourself after your procedure. Your health care provider may also give you more specific instructions. Your treatment has been planned according to current medical practices, but problems sometimes occur. Call your health care provider if you have any problems or questions after your procedure. WHAT TO EXPECT AFTER THE PROCEDURE After your procedure, it is typical to have the following:   Abdominal cramps.  Bloating.  A small amount of rectal bleeding if you had a biopsy. HOME CARE INSTRUCTIONS  Only take over-the-counter or prescription medicines for pain, fever, or discomfort as directed by your health care provider.  Resume your normal diet and activities as directed by your health care provider. SEEK MEDICAL CARE IF:  You have abdominal pain or cramping that lasts longer than 1 hour after the procedure.  You continue to have small amounts of rectal bleeding after 24 hours.  You have nausea or vomiting.  You feel weak or dizzy. SEEK IMMEDIATE MEDICAL CARE IF:   You have a fever.  You pass large blood clots or see a large amount of blood in the toilet after having a bowel movement. This may also occur 10-14 days after the procedure. It is more likely if you had a biopsy.  You develop abdominal pain that is not relieved with medicine or your abdominal pain gets worse.  You have nausea or vomiting for more than 24 hours after the procedure.   This information is not intended to replace advice given to you by your health care provider. Make sure you discuss any questions you have with your health care provider.   Document Released: 11/03/2013 Document Reviewed: 11/03/2013 Elsevier Interactive Patient Education [employer].

## 2016-07-11 NOTE — Op Note (Signed)
[facility] Patient Name: Cassidy Barber Procedure Date: 07/11/2016 12:40 PM MRN: [id number] Date of Birth: 1951/05/17 Attending MD: Hildred Laser , MD CSN: [id number] Age: 65 Admit Type: Outpatient Procedure:                Flexible Sigmoidoscopy Indications:              High risk colon cancer surveillance: Personal                            history of colon cancer, High risk colon cancer                            surveillance: Personal history of hereditary                            nonpolyposis colorectal cancer (Lynch Syndrome) Providers:                Hildred Laser, MD, Lurline Del, RN, Isabella Stalling,                            Technician Referring MD:             Claretta Fraise, MD Medicines:                Meperidine 50 mg IV, Midazolam 8 mg IV Complications:            No immediate complications. Estimated Blood Loss:     Estimated blood loss: none. Procedure:                Pre-Anesthesia Assessment:                           - Prior to the procedure, a History and Physical                            was performed, and patient medications and                            allergies were reviewed. The patient's tolerance of                            previous anesthesia was also reviewed. The risks                            and benefits of the procedure and the sedation                            options and risks were discussed with the patient.                            All questions were answered, and informed consent                            was obtained. Prior Anticoagulants: The patient  last took aspirin 1 day prior to the procedure. ASA                            Grade Assessment: III - A patient with severe                            systemic disease. After reviewing the risks and                            benefits, the patient was deemed in satisfactory                            condition to undergo the procedure.          After obtaining informed consent, the scope was                            passed under direct vision. The [id number]                            [id number]) scope was introduced through the anus and                            advanced to the the ileo-sigmoid anastomosis. The                            flexible sigmoidoscopy was accomplished without                            difficulty. The patient tolerated the procedure                            well. The quality of the bowel preparation was good. Scope In: 1:00:47 PM Scope Out: 1:07:21 PM Total Procedure Duration: 0 hours 6 minutes 34 seconds  Findings:      The perianal and digital rectal examinations were normal. Pertinent       negatives include normal sphincter tone and no palpable rectal lesions.      The ileum appeared normal.      The anastomosis appeared normal.      The rectum and distal sigmoid colon appeared normal.      External hemorrhoids were found during retroflexion. The hemorrhoids       were small.      Anal papilla(e) were hypertrophied. Impression:               - The terminal ileum is normal.                           - The colonic anastomosis is normal. ileocolonic                            anastoted at 30 cms from the anal margin                           - The rectum and distal sigmoid colon are normal.                           -  External hemorrhoids.                           - Anal papilla(e) were hypertrophied.                           - No specimens collected. Moderate Sedation:      Moderate (conscious) sedation was administered by the endoscopy nurse       and supervised by the endoscopist. The following parameters were       monitored: oxygen saturation, heart rate, blood pressure, CO2       capnography and response to care. Total physician intraservice time was       14 minutes. Recommendation:           - Discharge patient to home.                           - Resume previous diet.                            - Continue present medications.                           - Repeat flexible sigmoidoscopy in 1 year for                            surveillance. Procedure Code(s):        --- Professional ---                           [phone], Sigmoidoscopy, flexible; diagnostic,                            including collection of specimen(s) by brushing or                            washing, when performed (separate procedure)                           99152, Moderate sedation services provided by the                            same physician or other qualified health care                            professional performing the diagnostic or                            therapeutic service that the sedation supports,                            requiring the presence of an independent trained                            observer to assist in the monitoring of the  patient's level of consciousness and physiological                            status; initial 15 minutes of intraservice time,                            patient age 92 years or older Diagnosis Code(s):        --- Professional ---                           [id number], Personal history of other malignant                            neoplasm of large intestine                           Z15.09, Genetic susceptibility to other malignant                            neoplasm                           K64.4, Residual hemorrhoidal skin tags                           K62.89, Other specified diseases of anus and rectum CPT copyright 2016 [employer]. All rights reserved. The codes documented in this report are preliminary and upon coder review may  be revised to meet current compliance requirements. Hildred Laser, MD Hildred Laser, MD 07/11/2016 1:22:06 PM This report has been signed electronically. Number of Addenda: 0

## 2016-07-12 DIAGNOSIS — Z944 Liver transplant status: Secondary | ICD-10-CM | POA: Diagnosis not present

## 2016-07-12 DIAGNOSIS — C44329 Squamous cell carcinoma of skin of other parts of face: Secondary | ICD-10-CM | POA: Diagnosis not present

## 2016-07-17 ENCOUNTER — Encounter (HOSPITAL_COMMUNITY): Payer: Self-pay | Admitting: Internal Medicine

## 2016-08-02 DIAGNOSIS — D045 Carcinoma in situ of skin of trunk: Secondary | ICD-10-CM | POA: Diagnosis not present

## 2016-08-02 DIAGNOSIS — L720 Epidermal cyst: Secondary | ICD-10-CM | POA: Diagnosis not present

## 2016-08-02 DIAGNOSIS — D485 Neoplasm of uncertain behavior of skin: Secondary | ICD-10-CM | POA: Diagnosis not present

## 2016-08-02 DIAGNOSIS — L570 Actinic keratosis: Secondary | ICD-10-CM | POA: Diagnosis not present

## 2016-08-02 DIAGNOSIS — C44529 Squamous cell carcinoma of skin of other part of trunk: Secondary | ICD-10-CM | POA: Diagnosis not present

## 2016-08-13 DIAGNOSIS — Z944 Liver transplant status: Secondary | ICD-10-CM | POA: Diagnosis not present

## 2016-08-14 DIAGNOSIS — Z944 Liver transplant status: Secondary | ICD-10-CM | POA: Diagnosis not present

## 2016-08-14 DIAGNOSIS — D899 Disorder involving the immune mechanism, unspecified: Secondary | ICD-10-CM | POA: Diagnosis not present

## 2016-08-27 DIAGNOSIS — Z298 Encounter for other specified prophylactic measures: Secondary | ICD-10-CM | POA: Diagnosis not present

## 2016-08-27 DIAGNOSIS — D899 Disorder involving the immune mechanism, unspecified: Secondary | ICD-10-CM | POA: Diagnosis not present

## 2016-08-27 DIAGNOSIS — Z944 Liver transplant status: Secondary | ICD-10-CM | POA: Diagnosis not present

## 2016-08-28 ENCOUNTER — Encounter: Payer: Self-pay | Admitting: Family

## 2016-08-28 ENCOUNTER — Ambulatory Visit (INDEPENDENT_AMBULATORY_CARE_PROVIDER_SITE_OTHER): Payer: Medicare Other | Admitting: Family

## 2016-08-28 VITALS — BP 138/77 | HR 67 | Temp 97.0°F | Ht 63.0 in | Wt 152.2 lb

## 2016-08-28 DIAGNOSIS — K219 Gastro-esophageal reflux disease without esophagitis: Secondary | ICD-10-CM | POA: Diagnosis not present

## 2016-08-28 DIAGNOSIS — E039 Hypothyroidism, unspecified: Secondary | ICD-10-CM | POA: Diagnosis not present

## 2016-08-28 DIAGNOSIS — Z944 Liver transplant status: Secondary | ICD-10-CM | POA: Diagnosis not present

## 2016-08-28 DIAGNOSIS — Z23 Encounter for immunization: Secondary | ICD-10-CM | POA: Diagnosis not present

## 2016-08-28 DIAGNOSIS — I10 Essential (primary) hypertension: Secondary | ICD-10-CM

## 2016-08-28 DIAGNOSIS — E559 Vitamin D deficiency, unspecified: Secondary | ICD-10-CM

## 2016-08-28 DIAGNOSIS — F411 Generalized anxiety disorder: Secondary | ICD-10-CM | POA: Diagnosis not present

## 2016-08-28 DIAGNOSIS — G4700 Insomnia, unspecified: Secondary | ICD-10-CM

## 2016-08-28 DIAGNOSIS — D638 Anemia in other chronic diseases classified elsewhere: Secondary | ICD-10-CM

## 2016-08-28 MED ORDER — METOPROLOL SUCCINATE ER 25 MG PO TB24: ORAL_TABLET | ORAL | 1 refills | Status: DC

## 2016-08-28 MED ORDER — ALPRAZOLAM 0.5 MG PO TABS: 0.5000 mg | ORAL_TABLET | Freq: Every evening | ORAL | 1 refills | Status: DC | PRN

## 2016-08-28 MED ORDER — LOSARTAN POTASSIUM 25 MG PO TABS: 25.0000 mg | ORAL_TABLET | Freq: Every day | ORAL | 1 refills | Status: DC

## 2016-08-28 NOTE — Addendum Note (Signed)
Addended by: Shelbie Ammons on: 08/28/2016 11:22 AM   Modules accepted: Orders

## 2016-08-28 NOTE — Progress Notes (Signed)
Subjective:    Patient ID: Cassidy Barber, female    DOB: 1951-09-28, 65 y.o.   MRN: [id number]  Pt presents to the office today for chronic follow up. PT had a liver transplant in January 2015.  Hypertension  This is a chronic problem. The current episode started more than 1 year ago. The problem has been resolved since onset. The problem is controlled. Associated symptoms include anxiety. Pertinent negatives include no blurred vision, headaches, palpitations, peripheral edema or shortness of breath. Risk factors for coronary artery disease include obesity, post-menopausal state and sedentary lifestyle. Past treatments include beta blockers and angiotensin blockers. The current treatment provides moderate improvement. Hypertensive end-organ damage includes a thyroid problem. There is no history of kidney disease, CAD/MI, CVA or heart failure.  Gastroesophageal Reflux  She complains of a hoarse voice. She reports no belching, no coughing, no heartburn or no wheezing. This is a chronic problem. The current episode started more than 1 year ago. The problem occurs rarely. The problem has been resolved. The symptoms are aggravated by lying down and certain foods. She has tried a PPI for the symptoms. The treatment provided significant relief.  Anxiety  Presents for follow-up visit. Symptoms include insomnia and nervous/anxious behavior. Patient reports no decreased concentration, depressed mood, excessive worry, irritability, palpitations, restlessness or shortness of breath. Symptoms occur occasionally.   Her past medical history is significant for anemia.  Thyroid Problem  Presents for follow-up visit. Symptoms include anxiety, diarrhea, dry skin and hoarse voice. Patient reports no constipation, depressed mood, palpitations or weight gain. The symptoms have been stable. There is no history of heart failure.  Anemia  Presents for follow-up visit. Symptoms include bruises/bleeds easily. There has been  no leg swelling, light-headedness or palpitations. There is no history of heart failure.  Diarrhea   This is a chronic problem. The current episode started more than 1 year ago. The problem occurs 5 to 10 times per day. The problem has been waxing and waning. Pertinent negatives include no coughing or headaches. She has tried anti-motility drug and increased fluids for the symptoms.  Insomnia  Primary symptoms: difficulty falling asleep, frequent awakening.  The current episode started more than one year. The onset quality is gradual. The problem occurs every several days. The problem has been waxing and waning since onset. Past treatments include medication. The treatment provided moderate relief.      Review of Systems  Constitutional: Negative for irritability and weight gain.  HENT: Positive for hoarse voice.   Eyes: Negative for blurred vision.  Respiratory: Negative for cough, shortness of breath and wheezing.   Cardiovascular: Negative for palpitations.  Gastrointestinal: Positive for diarrhea. Negative for constipation and heartburn.  Neurological: Negative for light-headedness and headaches.  Hematological: Bruises/bleeds easily.  Psychiatric/Behavioral: Negative for decreased concentration. The patient is nervous/anxious and has insomnia.   All other systems reviewed and are negative.      Objective:   Physical Exam  Constitutional: She is oriented to person, place, and time. She appears well-developed and well-nourished. No distress.  HENT:  Head: Normocephalic and atraumatic.  Right Ear: External ear normal.  Left Ear: External ear normal.  Nose: Nose normal.  Mouth/Throat: Oropharynx is clear and moist.  Eyes: Pupils are equal, round, and reactive to light.  Neck: Normal range of motion. Neck supple. No thyromegaly present.  Cardiovascular: Normal rate, regular rhythm, normal heart sounds and intact distal pulses.   No murmur heard. Pulmonary/Chest: Effort normal and  breath sounds  normal. No respiratory distress. She has no wheezes.  Abdominal: Soft. Bowel sounds are normal. She exhibits no distension. There is no tenderness.  Musculoskeletal: Normal range of motion. She exhibits no edema or tenderness.  Neurological: She is alert and oriented to person, place, and time.  Skin: Skin is warm and dry.  Psychiatric: She has a normal mood and affect. Her behavior is normal. Judgment and thought content normal.  Vitals reviewed.     BP 138/77   Pulse 67   Temp 97 F (36.1 C) (Oral)   Ht 5\' 3"  (1.6 m)   Wt 152 lb 3.2 oz (69 kg)   BMI 26.96 kg/m      Assessment & Plan:  1. Essential hypertension, benign  2. Hypothyroidism, unspecified type - Thyroid Panel With TSH  3. Anemia of chronic disease  4. Hx of liver transplant ([place])  5. GAD (generalized anxiety disorder) - ALPRAZolam (XANAX) 0.5 MG tablet; Take 1 tablet (0.5 mg total) by mouth at bedtime as needed. for sleep  Dispense: 30 tablet; Refill: 1  6. Vitamin D deficiency - VITAMIN D 25 Hydroxy (Vit-D Deficiency, Fractures)  7. Gastroesophageal reflux disease, esophagitis presence not specified  8. Insomnia, unspecified type - ALPRAZolam (XANAX) 0.5 MG tablet; Take 1 tablet (0.5 mg total) by mouth at bedtime as needed. for sleep  Dispense: 30 tablet; Refill: 1    Continue all meds Labs pending, labs reviewed CBC and CMP from [facility] from her [facility]  Health Maintenance reviewed Diet and exercise encouraged RTO 6 months   [name], FNP

## 2016-08-28 NOTE — Patient Instructions (Signed)

## 2016-08-29 LAB — THYROID PANEL WITH TSH
Free Thyroxine Index: 2.5 (ref 1.2–4.9)
T3 Uptake Ratio: 29 % (ref 24–39)
T4, Total: 8.7 ug/dL (ref 4.5–12.0)
TSH: 0.644 u[IU]/mL (ref 0.450–4.500)

## 2016-08-29 LAB — VITAMIN D 25 HYDROXY (VIT D DEFICIENCY, FRACTURES): VIT D 25 HYDROXY: 23.9 ng/mL — AB (ref 30.0–100.0)

## 2016-08-30 ENCOUNTER — Encounter (INDEPENDENT_AMBULATORY_CARE_PROVIDER_SITE_OTHER): Payer: Self-pay | Admitting: Internal Medicine

## 2016-08-30 ENCOUNTER — Other Ambulatory Visit (INDEPENDENT_AMBULATORY_CARE_PROVIDER_SITE_OTHER): Payer: Self-pay | Admitting: *Deleted

## 2016-08-30 ENCOUNTER — Telehealth (INDEPENDENT_AMBULATORY_CARE_PROVIDER_SITE_OTHER): Payer: Self-pay | Admitting: *Deleted

## 2016-08-30 ENCOUNTER — Other Ambulatory Visit: Payer: Self-pay | Admitting: Family

## 2016-08-30 ENCOUNTER — Telehealth (INDEPENDENT_AMBULATORY_CARE_PROVIDER_SITE_OTHER): Payer: Self-pay | Admitting: Internal Medicine

## 2016-08-30 DIAGNOSIS — L570 Actinic keratosis: Secondary | ICD-10-CM | POA: Diagnosis not present

## 2016-08-30 DIAGNOSIS — L905 Scar conditions and fibrosis of skin: Secondary | ICD-10-CM | POA: Diagnosis not present

## 2016-08-30 DIAGNOSIS — D485 Neoplasm of uncertain behavior of skin: Secondary | ICD-10-CM | POA: Diagnosis not present

## 2016-08-30 DIAGNOSIS — L814 Other melanin hyperpigmentation: Secondary | ICD-10-CM | POA: Diagnosis not present

## 2016-08-30 DIAGNOSIS — L820 Inflamed seborrheic keratosis: Secondary | ICD-10-CM | POA: Diagnosis not present

## 2016-08-30 DIAGNOSIS — D0439 Carcinoma in situ of skin of other parts of face: Secondary | ICD-10-CM | POA: Diagnosis not present

## 2016-08-30 DIAGNOSIS — L821 Other seborrheic keratosis: Secondary | ICD-10-CM | POA: Diagnosis not present

## 2016-08-30 MED ORDER — VITAMIN D (ERGOCALCIFEROL) 1.25 MG (50000 UNIT) PO CAPS: 50000.0000 [IU] | ORAL_CAPSULE | ORAL | 3 refills | Status: DC

## 2016-08-30 MED ORDER — DIPHENOXYLATE-ATROPINE 2.5-0.025 MG PO TABS: 1.0000 | ORAL_TABLET | Freq: Four times a day (QID) | ORAL | 2 refills | Status: DC

## 2016-08-30 NOTE — Telephone Encounter (Signed)
Patient called, stated that her pharmacy sent a request for a refill on Dphenatrop (sp? Patient spelled it out) and it hasn't been called in.  She'd like this called in or a call letting her know if we are or are not going to be able to.  [phone]

## 2016-08-30 NOTE — Telephone Encounter (Signed)
need a refill on Diphen/Atrop  2.5 mg   [facility] said they had sent a fax but had no response let me know if you can or [facility]ide you refill  Thanks  Fareeda Money  [phone]    This will be addressed with Dr.Rehman.

## 2016-08-30 NOTE — Telephone Encounter (Signed)
This will be addressed with Dr.Rehman. We have not rec'd a request from her pharmacy for this medication.

## 2016-08-30 NOTE — Telephone Encounter (Signed)
Rx was sent to the patient's pharmacy.

## 2016-08-30 NOTE — Telephone Encounter (Signed)
Rx h as been sent to the patient's pharmacy. She is aware.

## 2016-08-30 NOTE — Telephone Encounter (Signed)
Patient called for a refill on Lomotil. This was called to [facility]. Per Dr.Rehman's approval. Patient is aware.

## 2016-09-04 ENCOUNTER — Encounter: Payer: Self-pay | Admitting: Family

## 2016-09-04 ENCOUNTER — Ambulatory Visit (INDEPENDENT_AMBULATORY_CARE_PROVIDER_SITE_OTHER): Payer: Medicare Other | Admitting: Family

## 2016-09-04 VITALS — BP 122/72 | HR 70 | Temp 97.8°F | Ht 63.0 in | Wt 149.0 lb

## 2016-09-04 DIAGNOSIS — Z0000 Encounter for general adult medical examination without abnormal findings: Secondary | ICD-10-CM | POA: Diagnosis not present

## 2016-09-04 DIAGNOSIS — Z01419 Encounter for gynecological examination (general) (routine) without abnormal findings: Secondary | ICD-10-CM

## 2016-09-04 MED ORDER — LOPERAMIDE HCL 2 MG PO CAPS: ORAL_CAPSULE | ORAL | 1 refills | Status: DC

## 2016-09-04 NOTE — Patient Instructions (Signed)

## 2016-09-04 NOTE — Addendum Note (Signed)
Addended by: [name] A on: 09/04/2016 11:08 AM   Modules accepted: Orders

## 2016-09-04 NOTE — Progress Notes (Signed)
   Subjective:    Patient ID: Cassidy Barber, female    DOB: 06-22-1951, 65 y.o.   MRN: [id number]   HPI Pt presents to the office today for pap today. PT was seen in the office on 08/28/16 for chronic follow up and was encouraged to make pap appointment. PT states she is doing well and has no complaints today. No changes from our previous visit. Pt denies any headache, palpitations, SOB, or edema at this time. PT had several skin lesions removed this week from dermatologists.    Review of Systems  Musculoskeletal: Positive for back pain.  All other systems reviewed and are negative.      Objective:   Physical Exam  Constitutional: She is oriented to person, place, and time. She appears well-developed and well-nourished. No distress.  HENT:  Head: Normocephalic and atraumatic.  Right Ear: External ear normal.  Mouth/Throat: Oropharynx is clear and moist.  Eyes: Pupils are equal, round, and reactive to light.  Neck: Normal range of motion. Neck supple. No thyromegaly present.  Cardiovascular: Normal rate, regular rhythm, normal heart sounds and intact distal pulses.   No murmur heard. Pulmonary/Chest: Effort normal and breath sounds normal. No respiratory distress. She has no wheezes. Right breast exhibits no inverted nipple, no mass, no nipple discharge, no skin change and no tenderness. Left breast exhibits no inverted nipple, no mass, no nipple discharge, no skin change and no tenderness. Breasts are symmetrical.  Abdominal: Soft. Bowel sounds are normal. She exhibits no distension. There is no tenderness.  Genitourinary: Vagina normal. No vaginal discharge found.  Genitourinary Comments: Bimanual exam- no adnexal masses or tenderness, ovaries nonpalpable   Cervix not present- No discharge   Musculoskeletal: Normal range of motion. She exhibits no edema or tenderness.  Neurological: She is alert and oriented to person, place, and time. She has normal reflexes. No cranial nerve  deficit.  Skin: Skin is warm and dry.  Psychiatric: She has a normal mood and affect. Her behavior is normal. Judgment and thought content normal.  Vitals reviewed.    BP 122/72   Pulse 70   Temp 97.8 F (36.6 C) (Oral)   Ht 5\' 3"  (1.6 m)   Wt 149 lb (67.6 kg)   BMI 26.39 kg/m       Assessment & Plan:  1. Physical exam - Pap IG (Image Guided)  2. Encounter for gynecological examination without abnormal finding - Pap IG (Image Guided)   Continue all meds and keep appts with [facility] Labs discussed Health Maintenance reviewed- Mammogram scheduled for February, Zoster vaccine refused related to cost Diet and exercise encouraged RTO as needed and keep chronic follow up appts!  [name], FNP

## 2016-09-07 ENCOUNTER — Telehealth: Payer: Self-pay | Admitting: Family

## 2016-09-07 ENCOUNTER — Other Ambulatory Visit: Payer: Self-pay | Admitting: Family

## 2016-09-07 DIAGNOSIS — I10 Essential (primary) hypertension: Secondary | ICD-10-CM

## 2016-09-07 MED ORDER — LOPERAMIDE HCL 2 MG PO CAPS: ORAL_CAPSULE | ORAL | 1 refills | Status: DC

## 2016-09-07 MED ORDER — LOSARTAN POTASSIUM 25 MG PO TABS: 25.0000 mg | ORAL_TABLET | Freq: Every day | ORAL | 1 refills | Status: DC

## 2016-09-07 MED ORDER — VITAMIN D (ERGOCALCIFEROL) 1.25 MG (50000 UNIT) PO CAPS: 50000.0000 [IU] | ORAL_CAPSULE | ORAL | 3 refills | Status: DC

## 2016-09-07 MED ORDER — METOPROLOL SUCCINATE ER 25 MG PO TB24: ORAL_TABLET | ORAL | 1 refills | Status: DC

## 2016-09-07 NOTE — Telephone Encounter (Signed)
RX sent to new pharmacy

## 2016-09-09 ENCOUNTER — Encounter (INDEPENDENT_AMBULATORY_CARE_PROVIDER_SITE_OTHER): Payer: Self-pay | Admitting: Internal Medicine

## 2016-09-09 LAB — PAP IG (IMAGE GUIDED): PAP Smear Comment: 0

## 2016-09-10 ENCOUNTER — Telehealth (INDEPENDENT_AMBULATORY_CARE_PROVIDER_SITE_OTHER): Payer: Self-pay | Admitting: *Deleted

## 2016-09-10 NOTE — Telephone Encounter (Signed)
I have been summons to appear in court Nov 27 and thought you would help me not to have to go. With my diarrhea and don't think I could stay in a jury seat that long.     Please let me know as soon as you can I have a paper to fill out I can come to your office when ever you say.     Thank you  Cassidy Barber

## 2016-09-11 ENCOUNTER — Encounter (INDEPENDENT_AMBULATORY_CARE_PROVIDER_SITE_OTHER): Payer: Self-pay | Admitting: *Deleted

## 2016-09-11 NOTE — Telephone Encounter (Signed)
Hi Shellena ,  Do you have the paper work that [place] will need to complete for jury duty? If so, Please drop it off at the office with our receptionist and she will give it to me. Once it is completed we will call you to pick it up.  Thank You,  Alexiz Cothran

## 2016-09-12 ENCOUNTER — Encounter (INDEPENDENT_AMBULATORY_CARE_PROVIDER_SITE_OTHER): Payer: Self-pay | Admitting: Internal Medicine

## 2016-09-12 DIAGNOSIS — Z944 Liver transplant status: Secondary | ICD-10-CM | POA: Diagnosis not present

## 2016-09-14 ENCOUNTER — Encounter (INDEPENDENT_AMBULATORY_CARE_PROVIDER_SITE_OTHER): Payer: Self-pay | Admitting: Internal Medicine

## 2016-09-17 DIAGNOSIS — D899 Disorder involving the immune mechanism, unspecified: Secondary | ICD-10-CM | POA: Diagnosis not present

## 2016-09-17 DIAGNOSIS — Z944 Liver transplant status: Secondary | ICD-10-CM | POA: Diagnosis not present

## 2016-09-21 ENCOUNTER — Telehealth: Payer: Self-pay | Admitting: *Deleted

## 2016-09-21 ENCOUNTER — Telehealth: Payer: Self-pay | Admitting: Family

## 2016-09-21 ENCOUNTER — Ambulatory Visit (INDEPENDENT_AMBULATORY_CARE_PROVIDER_SITE_OTHER): Payer: Medicare Other | Admitting: Family

## 2016-09-21 ENCOUNTER — Ambulatory Visit (HOSPITAL_COMMUNITY)
Admission: RE | Admit: 2016-09-21 | Discharge: 2016-09-21 | Disposition: A | Discharge status: Home / Self Care | Payer: Medicare Other | Source: Ambulatory Visit | Attending: Family | Admitting: Family

## 2016-09-21 ENCOUNTER — Encounter: Payer: Self-pay | Admitting: Family

## 2016-09-21 VITALS — BP 134/78 | HR 66 | Temp 97.0°F | Ht 63.0 in | Wt 155.6 lb

## 2016-09-21 DIAGNOSIS — M4316 Spondylolisthesis, lumbar region: Secondary | ICD-10-CM | POA: Insufficient documentation

## 2016-09-21 DIAGNOSIS — M545 Low back pain, unspecified: Secondary | ICD-10-CM

## 2016-09-21 DIAGNOSIS — R109 Unspecified abdominal pain: Secondary | ICD-10-CM | POA: Diagnosis not present

## 2016-09-21 DIAGNOSIS — Z944 Liver transplant status: Secondary | ICD-10-CM | POA: Insufficient documentation

## 2016-09-21 DIAGNOSIS — I700 Atherosclerosis of aorta: Secondary | ICD-10-CM | POA: Diagnosis not present

## 2016-09-21 DIAGNOSIS — R319 Hematuria, unspecified: Secondary | ICD-10-CM

## 2016-09-21 DIAGNOSIS — M5440 Lumbago with sciatica, unspecified side: Secondary | ICD-10-CM | POA: Insufficient documentation

## 2016-09-21 DIAGNOSIS — N200 Calculus of kidney: Secondary | ICD-10-CM | POA: Insufficient documentation

## 2016-09-21 DIAGNOSIS — Z9081 Acquired absence of spleen: Secondary | ICD-10-CM | POA: Insufficient documentation

## 2016-09-21 DIAGNOSIS — K439 Ventral hernia without obstruction or gangrene: Secondary | ICD-10-CM | POA: Insufficient documentation

## 2016-09-21 DIAGNOSIS — N281 Cyst of kidney, acquired: Secondary | ICD-10-CM | POA: Insufficient documentation

## 2016-09-21 DIAGNOSIS — M47897 Other spondylosis, lumbosacral region: Secondary | ICD-10-CM | POA: Diagnosis not present

## 2016-09-21 DIAGNOSIS — Z9889 Other specified postprocedural states: Secondary | ICD-10-CM | POA: Insufficient documentation

## 2016-09-21 LAB — URINALYSIS, COMPLETE
Bilirubin, UA: NEGATIVE
Glucose, UA: NEGATIVE
Ketones, UA: NEGATIVE
Nitrite, UA: NEGATIVE
PH UA: 5 (ref 5.0–7.5)
PROTEIN UA: NEGATIVE
Specific Gravity, UA: 1.02 (ref 1.005–1.030)
UUROB: 0.2 mg/dL (ref 0.2–1.0)

## 2016-09-21 LAB — MICROSCOPIC EXAMINATION

## 2016-09-21 MED ORDER — HYDROCODONE-ACETAMINOPHEN 5-325 MG PO TABS: 1.0000 | ORAL_TABLET | Freq: Two times a day (BID) | ORAL | 0 refills | Status: DC | PRN

## 2016-09-21 NOTE — Progress Notes (Signed)
   Subjective:    Patient ID: Cassidy Barber, female    DOB: [birth year], 65 y.o.   MRN: [id number]  Back Pain  This is a recurrent problem. The current episode started in the past 7 days. The problem occurs intermittently. The problem is unchanged. The pain is present in the thoracic spine. The quality of the pain is described as aching. The pain does not radiate. The pain is at a severity of 8/10. The pain is moderate. The symptoms are aggravated by bending and twisting. Pertinent negatives include no bladder incontinence, bowel incontinence, dysuria, leg pain, numbness, tingling or weakness. (Urine frequency ) She has tried analgesics, bed rest and NSAIDs for the symptoms. The treatment provided mild relief.      Review of Systems  Gastrointestinal: Negative for bowel incontinence.  Genitourinary: Negative for bladder incontinence and dysuria.  Musculoskeletal: Positive for back pain.  Neurological: Negative for tingling, weakness and numbness.  All other systems reviewed and are negative.      Objective:   Physical Exam  Constitutional: She is oriented to person, place, and time. She appears well-developed and well-nourished. No distress.  Eyes: Pupils are equal, round, and reactive to light.  Neck: Normal range of motion. Neck supple. No thyromegaly present.  Cardiovascular: Normal rate, regular rhythm, normal heart sounds and intact distal pulses.   No murmur heard. Pulmonary/Chest: Effort normal and breath sounds normal. No respiratory distress. She has no wheezes.  Abdominal: Soft. Bowel sounds are normal. She exhibits no distension. There is no tenderness.  Musculoskeletal: Normal range of motion. She exhibits no edema or tenderness.  Right flank pain   Neurological: She is alert and oriented to person, place, and time.  Skin: Skin is warm and dry.  Psychiatric: She has a normal mood and affect. Her behavior is normal. Judgment and thought content normal.  Vitals  reviewed.     BP 134/78   Pulse 66   Temp 97 F (36.1 C) (Oral)   Ht 5\' 3"  (1.6 m)   Wt 155 lb 9.6 oz (70.6 kg)   BMI 27.56 kg/m      Assessment & Plan:  1. Bilateral low back pain with sciatica, sciatica laterality unspecified, unspecified chronicity - CT RENAL STONE STUDY; Future  2. Acute bilateral low back pain without sciatica - Urinalysis, Complete - HYDROcodone-acetaminophen (NORCO/VICODIN) 5-325 MG tablet; Take 1 tablet by mouth every 12 (twelve) hours as needed for moderate pain.  Dispense: 20 tablet; Refill: 0 - CT RENAL STONE STUDY; Future  3. Hematuria, unspecified type - HYDROcodone-acetaminophen (NORCO/VICODIN) 5-325 MG tablet; Take 1 tablet by mouth every 12 (twelve) hours as needed for moderate pain.  Dispense: 20 tablet; Refill: 0 - CT RENAL STONE STUDY; Future  4. Rt flank pain - HYDROcodone-acetaminophen (NORCO/VICODIN) 5-325 MG tablet; Take 1 tablet by mouth every 12 (twelve) hours as needed for moderate pain.  Dispense: 20 tablet; Refill: 0 - CT RENAL STONE STUDY; Future  Force fluids CT scan pending to rule out Nephrolithiasis Pain rx given to take as needed for pain  [name], FNP

## 2016-09-21 NOTE — Telephone Encounter (Signed)
Patient requesting results of test done today. Please advise

## 2016-09-21 NOTE — Patient Instructions (Addendum)
Kidney Stones °Kidney stones (urolithiasis) are deposits that form inside your kidneys. The intense pain is caused by the stone moving through the urinary tract. When the stone moves, the ureter goes into spasm around the stone. The stone is usually passed in the urine.  °CAUSES  °· A disorder that makes certain neck glands produce too much parathyroid hormone (primary hyperparathyroidism). °· A buildup of uric acid crystals, similar to gout in your joints. °· Narrowing (stricture) of the ureter. °· A kidney obstruction present at birth (congenital obstruction). °· Previous surgery on the kidney or ureters. °· Numerous kidney infections. °SYMPTOMS  °· Feeling sick to your stomach (nauseous). °· Throwing up (vomiting). °· Blood in the urine (hematuria). °· Pain that usually spreads (radiates) to the groin. °· Frequency or urgency of urination. °DIAGNOSIS  °· Taking a history and physical exam. °· Blood or urine tests. °· CT scan. °· Occasionally, an examination of the inside of the urinary bladder (cystoscopy) is performed. °TREATMENT  °· Observation. °· Increasing your fluid intake. °· Extracorporeal shock wave lithotripsy--This is a noninvasive procedure that uses shock waves to break up kidney stones. °· Surgery may be needed if you have severe pain or persistent obstruction. There are various surgical procedures. Most of the procedures are performed with the use of small instruments. Only small incisions are needed to accommodate these instruments, so recovery time is minimized. °The size, location, and chemical composition are all important variables that will determine the proper choice of action for you. Talk to your health care provider to better understand your situation so that you will minimize the risk of injury to yourself and your kidney.  °HOME CARE INSTRUCTIONS  °· Drink enough water and fluids to keep your urine clear or pale yellow. This will help you to pass the stone or stone fragments. °· Strain  all urine through the provided strainer. Keep all particulate matter and stones for your health care provider to see. The stone causing the pain may be as small as a grain of salt. It is very important to use the strainer each and every time you pass your urine. The collection of your stone will allow your health care provider to analyze it and verify that a stone has actually passed. The stone analysis will often identify what you can do to reduce the incidence of recurrences. °· Only take over-the-counter or prescription medicines for pain, discomfort, or fever as directed by your health care provider. °· Keep all follow-up visits as told by your health care provider. This is important. °· Get follow-up X-rays if required. The absence of pain does not always mean that the stone has passed. It may have only stopped moving. If the urine remains completely obstructed, it can cause loss of kidney function or even complete destruction of the kidney. It is your responsibility to make sure X-rays and follow-ups are completed. Ultrasounds of the kidney can show blockages and the status of the kidney. Ultrasounds are not associated with any radiation and can be performed easily in a matter of minutes. °· Make changes to your daily diet as told by your health care provider. You may be told to: °¨ Limit the amount of salt that you eat. °¨ Eat 5 or more servings of fruits and vegetables each day. °¨ Limit the amount of meat, poultry, fish, and eggs that you eat. °· Collect a 24-hour urine sample as told by your health care provider. You may need to collect another urine sample every 6-12   months. °SEEK MEDICAL CARE IF: °· You experience pain that is progressive and unresponsive to any pain medicine you have been prescribed. °SEEK IMMEDIATE MEDICAL CARE IF:  °· Pain cannot be controlled with the prescribed medicine. °· You have a fever or shaking chills. °· The severity or intensity of pain increases over 18 hours and is not  relieved by pain medicine. °· You develop a new onset of abdominal pain. °· You feel faint or pass out. °· You are unable to urinate. °  °This information is not intended to replace advice given to you by your health care provider. Make sure you discuss any questions you have with your health care provider. °  °Document Released: 10/29/2005 Document Revised: 07/20/2015 Document Reviewed: 04/01/2013 °Elsevier Interactive Patient Education ©2016 [employer] ° °

## 2016-09-24 ENCOUNTER — Other Ambulatory Visit: Payer: Self-pay | Admitting: Family

## 2016-09-24 MED ORDER — SULFAMETHOXAZOLE-TRIMETHOPRIM 800-160 MG PO TABS: 1.0000 | ORAL_TABLET | Freq: Two times a day (BID) | ORAL | 0 refills | Status: DC

## 2016-09-26 DIAGNOSIS — Z944 Liver transplant status: Secondary | ICD-10-CM | POA: Diagnosis not present

## 2016-10-01 ENCOUNTER — Encounter (INDEPENDENT_AMBULATORY_CARE_PROVIDER_SITE_OTHER): Payer: Self-pay | Admitting: *Deleted

## 2016-10-01 ENCOUNTER — Other Ambulatory Visit (INDEPENDENT_AMBULATORY_CARE_PROVIDER_SITE_OTHER): Payer: Self-pay | Admitting: *Deleted

## 2016-10-01 DIAGNOSIS — K7682 Hepatic encephalopathy: Secondary | ICD-10-CM

## 2016-10-01 DIAGNOSIS — C189 Malignant neoplasm of colon, unspecified: Secondary | ICD-10-CM

## 2016-10-01 DIAGNOSIS — K7290 Hepatic failure, unspecified without coma: Secondary | ICD-10-CM

## 2016-10-01 DIAGNOSIS — K743 Primary biliary cirrhosis: Secondary | ICD-10-CM

## 2016-10-02 ENCOUNTER — Ambulatory Visit (INDEPENDENT_AMBULATORY_CARE_PROVIDER_SITE_OTHER): Payer: Medicare Other | Admitting: Internal Medicine

## 2016-10-14 ENCOUNTER — Encounter: Payer: Self-pay | Admitting: Family

## 2016-10-15 ENCOUNTER — Other Ambulatory Visit: Payer: Self-pay | Admitting: Family

## 2016-10-15 DIAGNOSIS — G4700 Insomnia, unspecified: Secondary | ICD-10-CM

## 2016-10-15 DIAGNOSIS — F411 Generalized anxiety disorder: Secondary | ICD-10-CM

## 2016-10-15 DIAGNOSIS — Z944 Liver transplant status: Secondary | ICD-10-CM | POA: Diagnosis not present

## 2016-10-15 DIAGNOSIS — D899 Disorder involving the immune mechanism, unspecified: Secondary | ICD-10-CM | POA: Diagnosis not present

## 2016-10-15 MED ORDER — ALPRAZOLAM 0.5 MG PO TABS: 0.5000 mg | ORAL_TABLET | Freq: Every evening | ORAL | 2 refills | Status: DC | PRN

## 2016-10-15 NOTE — Progress Notes (Signed)
Aware.  Xanax was called in.

## 2016-10-16 ENCOUNTER — Encounter: Payer: Self-pay | Admitting: Family

## 2016-10-16 DIAGNOSIS — Z944 Liver transplant status: Secondary | ICD-10-CM | POA: Diagnosis not present

## 2016-10-23 DIAGNOSIS — K743 Primary biliary cirrhosis: Secondary | ICD-10-CM | POA: Diagnosis not present

## 2016-10-23 DIAGNOSIS — C189 Malignant neoplasm of colon, unspecified: Secondary | ICD-10-CM | POA: Diagnosis not present

## 2016-10-23 DIAGNOSIS — K7290 Hepatic failure, unspecified without coma: Secondary | ICD-10-CM | POA: Diagnosis not present

## 2016-10-24 LAB — CEA: CEA: 1.4 ng/mL

## 2016-10-25 ENCOUNTER — Other Ambulatory Visit (INDEPENDENT_AMBULATORY_CARE_PROVIDER_SITE_OTHER): Payer: Self-pay | Admitting: *Deleted

## 2016-10-25 DIAGNOSIS — K7290 Hepatic failure, unspecified without coma: Secondary | ICD-10-CM

## 2016-10-25 DIAGNOSIS — K7682 Hepatic encephalopathy: Secondary | ICD-10-CM

## 2016-10-25 DIAGNOSIS — K743 Primary biliary cirrhosis: Secondary | ICD-10-CM

## 2016-10-30 ENCOUNTER — Other Ambulatory Visit: Payer: Self-pay | Admitting: Family

## 2016-10-30 DIAGNOSIS — I10 Essential (primary) hypertension: Secondary | ICD-10-CM

## 2016-11-09 DIAGNOSIS — L570 Actinic keratosis: Secondary | ICD-10-CM | POA: Diagnosis not present

## 2016-11-13 DIAGNOSIS — Z944 Liver transplant status: Secondary | ICD-10-CM | POA: Diagnosis not present

## 2016-11-16 ENCOUNTER — Other Ambulatory Visit: Payer: Self-pay | Admitting: Family

## 2016-11-16 DIAGNOSIS — F411 Generalized anxiety disorder: Secondary | ICD-10-CM

## 2016-11-16 DIAGNOSIS — G4700 Insomnia, unspecified: Secondary | ICD-10-CM

## 2016-11-19 DIAGNOSIS — Z944 Liver transplant status: Secondary | ICD-10-CM | POA: Diagnosis not present

## 2016-11-19 DIAGNOSIS — Z298 Encounter for other specified prophylactic measures: Secondary | ICD-10-CM | POA: Diagnosis not present

## 2016-12-04 DIAGNOSIS — T8649 Other complications of liver transplant: Secondary | ICD-10-CM | POA: Diagnosis not present

## 2016-12-04 DIAGNOSIS — E039 Hypothyroidism, unspecified: Secondary | ICD-10-CM | POA: Diagnosis not present

## 2016-12-04 DIAGNOSIS — Z944 Liver transplant status: Secondary | ICD-10-CM | POA: Diagnosis not present

## 2016-12-04 DIAGNOSIS — Z4823 Encounter for aftercare following liver transplant: Secondary | ICD-10-CM | POA: Diagnosis not present

## 2016-12-04 DIAGNOSIS — I10 Essential (primary) hypertension: Secondary | ICD-10-CM | POA: Diagnosis not present

## 2016-12-04 DIAGNOSIS — K831 Obstruction of bile duct: Secondary | ICD-10-CM | POA: Diagnosis not present

## 2016-12-04 DIAGNOSIS — D899 Disorder involving the immune mechanism, unspecified: Secondary | ICD-10-CM | POA: Diagnosis not present

## 2016-12-04 DIAGNOSIS — Z1509 Genetic susceptibility to other malignant neoplasm: Secondary | ICD-10-CM | POA: Diagnosis not present

## 2016-12-06 DIAGNOSIS — D044 Carcinoma in situ of skin of scalp and neck: Secondary | ICD-10-CM | POA: Diagnosis not present

## 2016-12-06 DIAGNOSIS — D0462 Carcinoma in situ of skin of left upper limb, including shoulder: Secondary | ICD-10-CM | POA: Diagnosis not present

## 2016-12-06 DIAGNOSIS — C44529 Squamous cell carcinoma of skin of other part of trunk: Secondary | ICD-10-CM | POA: Diagnosis not present

## 2016-12-06 DIAGNOSIS — L570 Actinic keratosis: Secondary | ICD-10-CM | POA: Diagnosis not present

## 2016-12-07 DIAGNOSIS — Z944 Liver transplant status: Secondary | ICD-10-CM | POA: Diagnosis not present

## 2016-12-25 ENCOUNTER — Ambulatory Visit (INDEPENDENT_AMBULATORY_CARE_PROVIDER_SITE_OTHER): Payer: Medicare Other | Admitting: Internal Medicine

## 2016-12-25 ENCOUNTER — Encounter (INDEPENDENT_AMBULATORY_CARE_PROVIDER_SITE_OTHER): Payer: Self-pay | Admitting: Internal Medicine

## 2016-12-25 VITALS — BP 120/68 | HR 66 | Temp 98.2°F | Resp 18 | Ht 66.0 in | Wt 144.8 lb

## 2016-12-25 DIAGNOSIS — D509 Iron deficiency anemia, unspecified: Secondary | ICD-10-CM | POA: Diagnosis not present

## 2016-12-25 DIAGNOSIS — K529 Noninfective gastroenteritis and colitis, unspecified: Secondary | ICD-10-CM | POA: Diagnosis not present

## 2016-12-25 DIAGNOSIS — E039 Hypothyroidism, unspecified: Secondary | ICD-10-CM

## 2016-12-25 LAB — IRON AND TIBC
%SAT: 5 % — AB (ref 11–50)
Iron: 15 ug/dL — ABNORMAL LOW (ref 45–160)
TIBC: 286 ug/dL (ref 250–450)
UIBC: 271 ug/dL (ref 125–400)

## 2016-12-25 LAB — TSH: TSH: 1.24 mIU/L

## 2016-12-25 LAB — FERRITIN: Ferritin: 14 ng/mL — ABNORMAL LOW (ref 20–288)

## 2016-12-25 NOTE — Patient Instructions (Addendum)
Physician will call with results of blood tests. Next flexible sigmoidoscopy in August 2018.

## 2016-12-25 NOTE — Progress Notes (Signed)
Presenting complaint;  Follow-up for chronic diarrhea. History of anemia.  Subjective:  Cassidy Barber is 66 year old Caucasian female who is here for scheduled visit. She states she was seen by Dr. Merrilee Jansky of [facility] last month and had blood work(see lab data). She feels she is doing well. She has an average of 3-4 stools per day. Ranges anywhere from 2-6 stools per day. Stool consistency is between loose and soft stools. She has not had any accidents or nocturnal bowel movements. She takes Imodium anywhere from 2-6 tablets per day. She has good appetite. She has not lost any weight since her last visit. She is not having any problems with fluid retention. She says heartburn is well controlled with therapy. She denies rectal bleeding. Her stools have been black since she hasn't on by mouth iron. She has occasional abdominal pain which is always relieved with defecation. She complains of feeling weak. She feels she has no energy. She denies lightheadedness. She states her sister was recently diagnosed with lung cancer. She believe it is primary but does not know the tissue type. She also wants to have blood test to make sure she is on appropriate dose of levothyroxine.   Current Medications: Outpatient Encounter Prescriptions as of 12/25/2016  Medication Sig  . acetaminophen (TYLENOL) 325 MG tablet Take 650 mg by mouth every 6 (six) hours as needed for mild pain or moderate pain.   [name] ALPRAZolam (XANAX) 0.5 MG tablet TAKE ONE TABLET AT BEDTIME AS NEEDED  . aspirin 325 MG tablet Take 325 mg by mouth daily.  . Cholecalciferol (VITAMIN D3) 2000 UNITS capsule Take 1 capsule by mouth daily.  . cholestyramine (QUESTRAN) 4 g packet Take 0.5 packets (2 g total) by mouth 2 (two) times daily.  . diphenoxylate-atropine (LOMOTIL) 2.5-0.025 MG tablet Take 1 tablet by mouth 4 (four) times daily. Reported on 11/22/2015  . EVEROLIMUS PO 6 mg. Takes 6 Po in the morning , 4 po at night.  . ferrous sulfate 325 (65 FE) MG tablet  Take 325 mg by mouth 2 (two) times daily with a meal.  . fluticasone (FLONASE) 50 MCG/ACT nasal spray Place 2 sprays into both nostrils daily.  [name] HYDROcodone-acetaminophen (NORCO/VICODIN) 5-325 MG tablet Take 1 tablet by mouth every 12 (twelve) hours as needed for moderate pain.  . hydrOXYzine (VISTARIL) 25 MG capsule Take 25 mg by mouth 3 (three) times daily as needed.   [name] levothyroxine (SYNTHROID, LEVOTHROID) 150 MCG tablet Take 1 tablet (150 mcg total) by mouth daily.  [name] loperamide (IMODIUM) 2 MG capsule TAKE 2 CAPSULES 4 TIMES DAILY AS NEEDED  . losartan (COZAAR) 25 MG tablet TAKE ONE (1) TABLET EACH DAY  . magnesium oxide (MAG-OX) 400 MG tablet Take 250 mg by mouth daily. Take 1 tablet by mouth daily.  . metoprolol succinate (TOPROL-XL) 25 MG 24 hr tablet TAKE ONE (1) TABLET EACH DAY  . mometasone (NASONEX) 50 MCG/ACT nasal spray Place 2 sprays into the nose daily. (Patient taking differently: Place 2 sprays into the nose daily. AS NEEDED)  . mupirocin ointment (BACTROBAN) 2 % Apply 1 application topically as needed. Reported on 11/30/2015  . pantoprazole (PROTONIX) 40 MG tablet Take 1 tablet (40 mg total) by mouth daily before breakfast.  . potassium chloride SA (K-DUR,KLOR-CON) 20 MEQ tablet TAKE ONE (1) TABLET EACH DAY  . PREVALITE 4 g packet Take 4 g by mouth daily.   . pseudoephedrine-guaifenesin (MUCINEX D) 60-600 MG 12 hr tablet TAKE 1 TABLET EVERY 12 HOURS AS NEEDED  FOR CONGESTION  . tacrolimus (PROGRAF) 1 MG capsule Take 2 mg by mouth 2 (two) times daily.  . [DISCONTINUED] loperamide (IMODIUM) 2 MG capsule Patient states that she takes on days that she is not at home. May be 2-4 per day. (Patient not taking: Reported on 12/25/2016)  . [DISCONTINUED] sulfamethoxazole-trimethoprim (BACTRIM DS) 800-160 MG tablet Take 1 tablet by mouth 2 (two) times daily. (Patient not taking: Reported on 12/25/2016)  . [DISCONTINUED] Vitamin D, Ergocalciferol, (DRISDOL) 50000 units CAPS capsule Take 1  capsule (50,000 Units total) by mouth every 7 (seven) days. (Patient not taking: Reported on 12/25/2016)   No facility-administered encounter medications on file as of 12/25/2016.      Objective: Blood pressure 120/68, pulse 66, temperature 98.2 F (36.8 C), temperature source Oral, resp. rate 18, height 5\' 6"  (1.676 m), weight 144 lb 12.8 oz (65.7 kg). Patient is alert and in no acute distress. She does not have asterixis. Conjunctiva is pink. Sclera is nonicteric Oropharyngeal mucosa is normal. No neck masses or thyromegaly noted. Cardiac exam with regular rhythm normal S1 and S2. No murmur or gallop noted. Lungs are clear to auscultation. Abdomen is full with multiple scars abdominal wall is thin. No organomegaly or masses. No LE edema or clubbing noted.  Labs/studies Results: Lab data from 11/19/2016  WBC 6.1, H&H 9.4 and 31.2 and platelet count 425K.  BUN 15, creatinine 0.87  Serum potassium 5.6. It was 5.7 on 10/15/2016  Bilirubin less than 0.2, AP 236, AST 25, ALT 18, total protein 6.6 and albumin 3.6  Calcium 8.8.   Alkaline phosphatase levels on different dates as follows    301 on 10/15/2016   339 on 09/17/2016   318 on 08/27/2016   382 on 08/14/2016   Iron studies from 12/05/2015  Serum iron 20 TIBC 237 and saturation 8%.  Assessment:  #1.Chronic diarrhea secondary to loss of all of her colon. Diarrhea would appear to be primarily due to fluid losses rather than malabsorption. Symptom control appears to be satisfactory except she has more bowel movements with certain foods. She is not having any side effects with combination of diphenoxylate and Imodium. #2. Iron deficiency anemia. She had low serum iron and saturation 1 year ago. She has not responded to by mouth iron. If she has iron malabsorption she will benefit from iron infusion. #3. History of colon carcinoma. She has Lynch syndrome. She has rectum in situ. Flexible sigmoidoscopy was in August 2017. #4.  Mildly elevated alkaline phosphatase. She is status post LDLT for PBC and she also required biliary stenting for anastomotic stricture. Alkaline phosphatase is coming down. She is being followed closely by Dr. Laurier Nancy of [facility]. Other concern would be recurrence of PBC in transplanted liver. #5. History of hypothyroidism. No recent TSH on file.  Plan:  Medication list updated. Patient will go the lab for serum iron TIBC and ferritin. Will also check TSH as she has not had it in over a year. Patient will continue diphenoxylate on schedule an loperamide on as-needed basis. Continue cholestyramine as before. Office visit in 6 months. Neck supple sigmoidoscopy in August 20

## 2016-12-31 DIAGNOSIS — Z1231 Encounter for screening mammogram for malignant neoplasm of breast: Secondary | ICD-10-CM | POA: Diagnosis not present

## 2016-12-31 DIAGNOSIS — Z803 Family history of malignant neoplasm of breast: Secondary | ICD-10-CM | POA: Diagnosis not present

## 2017-01-03 ENCOUNTER — Encounter (INDEPENDENT_AMBULATORY_CARE_PROVIDER_SITE_OTHER): Payer: Self-pay | Admitting: Internal Medicine

## 2017-01-09 ENCOUNTER — Encounter: Payer: Medicare Other | Admitting: *Deleted

## 2017-01-09 ENCOUNTER — Encounter (HOSPITAL_COMMUNITY): Payer: Medicare Other

## 2017-01-09 ENCOUNTER — Encounter (HOSPITAL_COMMUNITY)
Admission: RE | Admit: 2017-01-09 | Discharge: 2017-01-09 | Disposition: A | Discharge status: Home / Self Care | Payer: Medicare Other | Source: Ambulatory Visit | Attending: Internal Medicine | Admitting: Internal Medicine

## 2017-01-09 DIAGNOSIS — D509 Iron deficiency anemia, unspecified: Secondary | ICD-10-CM | POA: Insufficient documentation

## 2017-01-09 MED ORDER — SODIUM CHLORIDE 0.9 % IV SOLN
510.0000 mg | Freq: Once | INTRAVENOUS | Status: AC
  Administered 2017-01-09: 510 mg via INTRAVENOUS
  Filled 2017-01-09: qty 17

## 2017-01-09 MED ORDER — SODIUM CHLORIDE 0.9 % IV SOLN
INTRAVENOUS | Status: DC
  Administered 2017-01-09: 09:00:00 via INTRAVENOUS

## 2017-01-14 DIAGNOSIS — Z944 Liver transplant status: Secondary | ICD-10-CM | POA: Diagnosis not present

## 2017-01-16 ENCOUNTER — Encounter (HOSPITAL_COMMUNITY): Payer: Self-pay

## 2017-01-16 ENCOUNTER — Encounter (HOSPITAL_COMMUNITY)
Admission: RE | Admit: 2017-01-16 | Discharge: 2017-01-16 | Disposition: A | Discharge status: Home / Self Care | Payer: Medicare Other | Source: Ambulatory Visit | Attending: Internal Medicine | Admitting: Internal Medicine

## 2017-01-16 DIAGNOSIS — D509 Iron deficiency anemia, unspecified: Secondary | ICD-10-CM | POA: Insufficient documentation

## 2017-01-16 MED ORDER — SODIUM CHLORIDE 0.9 % IV SOLN
510.0000 mg | Freq: Once | INTRAVENOUS | Status: AC
  Administered 2017-01-16: 510 mg via INTRAVENOUS
  Filled 2017-01-16: qty 17

## 2017-01-16 MED ORDER — SODIUM CHLORIDE 0.9 % IV SOLN
INTRAVENOUS | Status: DC
  Administered 2017-01-16: 1000 mL via INTRAVENOUS

## 2017-01-16 NOTE — Progress Notes (Signed)
Blood return noted in IV before start of iron infusion.  No complaints at site.  Site clean , dry and intact.  No bruising or swelling noted.

## 2017-01-21 ENCOUNTER — Encounter: Payer: Medicare Other | Admitting: *Deleted

## 2017-01-22 ENCOUNTER — Other Ambulatory Visit: Payer: Self-pay | Admitting: Family

## 2017-01-22 ENCOUNTER — Other Ambulatory Visit (INDEPENDENT_AMBULATORY_CARE_PROVIDER_SITE_OTHER): Payer: Self-pay | Admitting: Internal Medicine

## 2017-01-22 DIAGNOSIS — I10 Essential (primary) hypertension: Secondary | ICD-10-CM

## 2017-01-24 ENCOUNTER — Other Ambulatory Visit (INDEPENDENT_AMBULATORY_CARE_PROVIDER_SITE_OTHER): Payer: Self-pay | Admitting: Internal Medicine

## 2017-01-27 DIAGNOSIS — Z944 Liver transplant status: Secondary | ICD-10-CM | POA: Diagnosis not present

## 2017-01-30 ENCOUNTER — Encounter (HOSPITAL_COMMUNITY): Payer: Self-pay

## 2017-01-30 ENCOUNTER — Encounter (HOSPITAL_COMMUNITY)
Admission: RE | Admit: 2017-01-30 | Discharge: 2017-01-30 | Disposition: A | Discharge status: Home / Self Care | Payer: Medicare Other | Source: Ambulatory Visit | Attending: Internal Medicine | Admitting: Internal Medicine

## 2017-01-30 ENCOUNTER — Ambulatory Visit: Payer: Medicare Other | Admitting: Neurology

## 2017-01-30 DIAGNOSIS — D509 Iron deficiency anemia, unspecified: Secondary | ICD-10-CM | POA: Diagnosis not present

## 2017-01-30 LAB — CBC
HCT: 33.7 % — ABNORMAL LOW (ref 36.0–46.0)
Hemoglobin: 10.4 g/dL — ABNORMAL LOW (ref 12.0–15.0)
MCH: 26.5 pg (ref 26.0–34.0)
MCHC: 30.9 g/dL (ref 30.0–36.0)
MCV: 85.8 fL (ref 78.0–100.0)
PLATELETS: 255 10*3/uL (ref 150–400)
RBC: 3.93 MIL/uL (ref 3.87–5.11)
RDW: 18 % — AB (ref 11.5–15.5)
WBC: 6.6 10*3/uL (ref 4.0–10.5)

## 2017-01-30 NOTE — Progress Notes (Signed)
Results for Cassidy Barber, Cassidy Barber (MRN [id number]) as of 01/30/2017 14:00  Ref. Range 12/31/2016 00:00 01/30/2017 08:54  WBC Latest Ref Range: 4.0 - 10.5 K/uL  6.6  RBC Latest Ref Range: 3.87 - 5.11 MIL/uL  3.93  Hemoglobin Latest Ref Range: 12.0 - 15.0 g/dL  10.4 (L)  HCT Latest Ref Range: 36.0 - 46.0 %  33.7 (L)  MCV Latest Ref Range: 78.0 - 100.0 fL  85.8  MCH Latest Ref Range: 26.0 - 34.0 pg  26.5  MCHC Latest Ref Range: 30.0 - 36.0 g/dL  30.9  RDW Latest Ref Range: 11.5 - 15.5 %  18.0 (H)  Platelets Latest Ref Range: 150 - 400 K/uL  255  HM MAMMOGRAPHY Unknown [place]

## 2017-02-02 ENCOUNTER — Encounter: Payer: Self-pay | Admitting: Family

## 2017-02-05 ENCOUNTER — Encounter: Payer: Self-pay | Admitting: Family

## 2017-02-05 ENCOUNTER — Ambulatory Visit (INDEPENDENT_AMBULATORY_CARE_PROVIDER_SITE_OTHER): Payer: Medicare Other | Admitting: Family

## 2017-02-05 VITALS — BP 134/78 | HR 64 | Temp 97.5°F | Ht 66.0 in | Wt 141.0 lb

## 2017-02-05 DIAGNOSIS — R300 Dysuria: Secondary | ICD-10-CM | POA: Diagnosis not present

## 2017-02-05 DIAGNOSIS — N3001 Acute cystitis with hematuria: Secondary | ICD-10-CM

## 2017-02-05 LAB — URINALYSIS, COMPLETE
Bilirubin, UA: NEGATIVE
Glucose, UA: NEGATIVE
Nitrite, UA: POSITIVE — AB
PH UA: 5 (ref 5.0–7.5)
Protein, UA: NEGATIVE
Specific Gravity, UA: 1.025 (ref 1.005–1.030)
Urobilinogen, Ur: 0.2 mg/dL (ref 0.2–1.0)

## 2017-02-05 LAB — MICROSCOPIC EXAMINATION
RENAL EPITHEL UA: NONE SEEN /HPF
WBC, UA: >30 /hpf — AB (ref 0–?)

## 2017-02-05 MED ORDER — SULFAMETHOXAZOLE-TRIMETHOPRIM 800-160 MG PO TABS: 1.0000 | ORAL_TABLET | Freq: Two times a day (BID) | ORAL | 0 refills | Status: DC

## 2017-02-05 NOTE — Progress Notes (Signed)
   Subjective:    Patient ID: Cassidy Barber, female    DOB: [birth year], 66 y.o.   MRN: [id number]  Dysuria   This is a new problem. The current episode started in the past 7 days. The problem occurs every urination. The problem has been waxing and waning. The quality of the pain is described as burning. The pain is at a severity of 8/10. The pain is moderate. There has been no fever. Associated symptoms include frequency, hesitancy and urgency. Pertinent negatives include no flank pain or hematuria. She has tried increased fluids for the symptoms. The treatment provided mild relief.      Review of Systems  Genitourinary: Positive for dysuria, frequency, hesitancy and urgency. Negative for flank pain and hematuria.  All other systems reviewed and are negative.      Objective:   Physical Exam  Constitutional: She is oriented to person, place, and time. She appears well-developed and well-nourished. No distress.  HENT:  Head: Normocephalic and atraumatic.  Eyes: Pupils are equal, round, and reactive to light.  Neck: Normal range of motion. Neck supple. No thyromegaly present.  Cardiovascular: Normal rate, regular rhythm, normal heart sounds and intact distal pulses.   No murmur heard. Pulmonary/Chest: Effort normal and breath sounds normal. No respiratory distress. She has no wheezes.  Abdominal: Soft. Bowel sounds are normal. She exhibits no distension. There is no tenderness.  Musculoskeletal: Normal range of motion. She exhibits no edema or tenderness.  Neurological: She is alert and oriented to person, place, and time.  Skin: Skin is warm and dry.  Psychiatric: She has a normal mood and affect. Her behavior is normal. Judgment and thought content normal.  Vitals reviewed.     BP 134/78   Pulse 64   Temp 97.5 F (36.4 C) (Oral)   Ht 5\' 6"  (1.676 m)   Wt 141 lb (64 kg)   BMI 22.76 kg/m      Assessment & Plan:  1. Dysuria - Urinalysis, Complete  2. Acute cystitis with  hematuria Force fluids AZO over the counter X2 days RTO prn Culture pending - sulfamethoxazole-trimethoprim (BACTRIM DS) 800-160 MG tablet; Take 1 tablet by mouth 2 (two) times daily.  Dispense: 14 tablet; Refill: 0 - Urine culture  [name], FNP

## 2017-02-05 NOTE — Patient Instructions (Signed)

## 2017-02-08 LAB — URINE CULTURE

## 2017-02-11 ENCOUNTER — Other Ambulatory Visit: Payer: Self-pay | Admitting: Family

## 2017-02-11 MED ORDER — NITROFURANTOIN MONOHYD MACRO 100 MG PO CAPS: 100.0000 mg | ORAL_CAPSULE | Freq: Two times a day (BID) | ORAL | 0 refills | Status: DC

## 2017-02-12 DIAGNOSIS — Z944 Liver transplant status: Secondary | ICD-10-CM | POA: Diagnosis not present

## 2017-02-12 DIAGNOSIS — D899 Disorder involving the immune mechanism, unspecified: Secondary | ICD-10-CM | POA: Diagnosis not present

## 2017-02-13 DIAGNOSIS — Z944 Liver transplant status: Secondary | ICD-10-CM | POA: Diagnosis not present

## 2017-02-14 DIAGNOSIS — L739 Follicular disorder, unspecified: Secondary | ICD-10-CM | POA: Diagnosis not present

## 2017-02-14 DIAGNOSIS — C44629 Squamous cell carcinoma of skin of left upper limb, including shoulder: Secondary | ICD-10-CM | POA: Diagnosis not present

## 2017-02-14 DIAGNOSIS — L570 Actinic keratosis: Secondary | ICD-10-CM | POA: Diagnosis not present

## 2017-02-14 DIAGNOSIS — L821 Other seborrheic keratosis: Secondary | ICD-10-CM | POA: Diagnosis not present

## 2017-02-14 DIAGNOSIS — D492 Neoplasm of unspecified behavior of bone, soft tissue, and skin: Secondary | ICD-10-CM | POA: Diagnosis not present

## 2017-02-14 DIAGNOSIS — D044 Carcinoma in situ of skin of scalp and neck: Secondary | ICD-10-CM | POA: Diagnosis not present

## 2017-02-26 ENCOUNTER — Ambulatory Visit (INDEPENDENT_AMBULATORY_CARE_PROVIDER_SITE_OTHER): Payer: Medicare Other | Admitting: Family

## 2017-02-26 ENCOUNTER — Encounter: Payer: Self-pay | Admitting: Family

## 2017-02-26 VITALS — BP 128/82 | HR 73 | Temp 97.1°F | Ht 66.0 in | Wt 139.6 lb

## 2017-02-26 DIAGNOSIS — E039 Hypothyroidism, unspecified: Secondary | ICD-10-CM | POA: Diagnosis not present

## 2017-02-26 DIAGNOSIS — R350 Frequency of micturition: Secondary | ICD-10-CM

## 2017-02-26 DIAGNOSIS — Z944 Liver transplant status: Secondary | ICD-10-CM | POA: Diagnosis not present

## 2017-02-26 DIAGNOSIS — F411 Generalized anxiety disorder: Secondary | ICD-10-CM

## 2017-02-26 DIAGNOSIS — I10 Essential (primary) hypertension: Secondary | ICD-10-CM

## 2017-02-26 DIAGNOSIS — D638 Anemia in other chronic diseases classified elsewhere: Secondary | ICD-10-CM

## 2017-02-26 DIAGNOSIS — E559 Vitamin D deficiency, unspecified: Secondary | ICD-10-CM

## 2017-02-26 DIAGNOSIS — G4700 Insomnia, unspecified: Secondary | ICD-10-CM | POA: Diagnosis not present

## 2017-02-26 DIAGNOSIS — K219 Gastro-esophageal reflux disease without esophagitis: Secondary | ICD-10-CM

## 2017-02-26 LAB — URINALYSIS, COMPLETE
Bilirubin, UA: NEGATIVE
GLUCOSE, UA: NEGATIVE
Ketones, UA: NEGATIVE
Leukocytes, UA: NEGATIVE
NITRITE UA: NEGATIVE
Protein, UA: NEGATIVE
Specific Gravity, UA: 1.02 (ref 1.005–1.030)
UUROB: 0.2 mg/dL (ref 0.2–1.0)
pH, UA: 5 (ref 5.0–7.5)

## 2017-02-26 LAB — MICROSCOPIC EXAMINATION: Renal Epithel, UA: NONE SEEN /hpf

## 2017-02-26 MED ORDER — ALPRAZOLAM 0.5 MG PO TABS: 0.5000 mg | ORAL_TABLET | Freq: Every evening | ORAL | 3 refills | Status: DC | PRN

## 2017-02-26 MED ORDER — HYDROCODONE-ACETAMINOPHEN 5-325 MG PO TABS: 1.0000 | ORAL_TABLET | Freq: Two times a day (BID) | ORAL | 0 refills | Status: DC | PRN

## 2017-02-26 NOTE — Progress Notes (Signed)
Subjective:    Patient ID: Cassidy Barber, female    DOB: Nov 23, 1950, 66 y.o.   MRN: [id number]  Pt presents to the office today for chronic follow up. PT had a liver transplant in January 2015.  Hypertension  This is a chronic problem. The current episode started more than 1 year ago. The problem has been resolved since onset. The problem is controlled. Associated symptoms include anxiety. Pertinent negatives include no blurred vision, headaches, palpitations, peripheral edema or shortness of breath. Risk factors for coronary artery disease include obesity, post-menopausal state and sedentary lifestyle. Past treatments include beta blockers and angiotensin blockers. The current treatment provides moderate improvement. There is no history of kidney disease, CAD/MI, CVA or heart failure. Identifiable causes of hypertension include a thyroid problem.  Gastroesophageal Reflux  She complains of a hoarse voice. She reports no belching, no coughing, no heartburn or no wheezing. This is a chronic problem. The current episode started more than 1 year ago. The problem occurs rarely. The problem has been resolved. The symptoms are aggravated by lying down and certain foods. She has tried a PPI for the symptoms. The treatment provided significant relief.  Anxiety  Presents for follow-up visit. Symptoms include insomnia and nervous/anxious behavior. Patient reports no decreased concentration, depressed mood, excessive worry, irritability, palpitations, restlessness or shortness of breath. Symptoms occur occasionally.   Her past medical history is significant for anemia.  Thyroid Problem  Presents for follow-up visit. Symptoms include anxiety, diarrhea, dry skin and hoarse voice. Patient reports no constipation, depressed mood, palpitations or weight gain. The symptoms have been stable. There is no history of heart failure.  Anemia  Presents for follow-up visit. Symptoms include bruises/bleeds easily. There has  been no leg swelling, light-headedness or palpitations. There is no history of heart failure.  Diarrhea   This is a chronic problem. The current episode started more than 1 year ago. The problem occurs 5 to 10 times per day. The problem has been waxing and waning. Pertinent negatives include no coughing, headaches or vomiting. She has tried anti-motility drug and increased fluids for the symptoms.  Insomnia  Primary symptoms: fragmented sleep, difficulty falling asleep, frequent awakening.  The current episode started more than one year. The onset quality is gradual. The problem occurs every several days. The problem has been waxing and waning since onset. Past treatments include medication. The treatment provided moderate relief.  Urinary Frequency   This is a recurrent problem. The current episode started 1 to 4 weeks ago. The problem has been unchanged. The patient is experiencing no pain. There has been no fever. Associated symptoms include frequency and urgency. Pertinent negatives include no hematuria, hesitancy or vomiting. She has tried antibiotics for the symptoms. The treatment provided moderate relief.      Review of Systems  Constitutional: Negative for irritability and weight gain.  HENT: Positive for hoarse voice.   Eyes: Negative for blurred vision.  Respiratory: Negative for cough, shortness of breath and wheezing.   Cardiovascular: Negative for palpitations.  Gastrointestinal: Positive for diarrhea. Negative for constipation, heartburn and vomiting.  Genitourinary: Positive for frequency and urgency. Negative for hematuria and hesitancy.  Neurological: Negative for light-headedness and headaches.  Hematological: Bruises/bleeds easily.  Psychiatric/Behavioral: Negative for decreased concentration. The patient is nervous/anxious and has insomnia.   All other systems reviewed and are negative.      Objective:   Physical Exam  Constitutional: She is oriented to person,  place, and time. She appears well-developed and  well-nourished. No distress.  HENT:  Head: Normocephalic and atraumatic.  Right Ear: External ear normal.  Left Ear: External ear normal.  Nose: Nose normal.  Mouth/Throat: Oropharynx is clear and moist.  Eyes: Pupils are equal, round, and reactive to light.  Neck: Normal range of motion. Neck supple. No thyromegaly present.  Cardiovascular: Normal rate, regular rhythm, normal heart sounds and intact distal pulses.   No murmur heard. Pulmonary/Chest: Effort normal and breath sounds normal. No respiratory distress. She has no wheezes.  Abdominal: Soft. Bowel sounds are normal. She exhibits no distension. There is no tenderness.  Musculoskeletal: Normal range of motion. She exhibits no edema or tenderness.  Neurological: She is alert and oriented to person, place, and time.  Skin: Skin is warm and dry.  Psychiatric: She has a normal mood and affect. Her behavior is normal. Judgment and thought content normal.  Vitals reviewed.     BP 128/82   Pulse 73   Temp 97.1 F (36.2 C) (Oral)   Ht '5\' 6"'$  (1.676 m)   Wt 139 lb 9.6 oz (63.3 kg)   BMI 22.53 kg/m      Assessment & Plan:  1. Essential hypertension, benign - BMP8+EGFR  2. Gastroesophageal reflux disease, esophagitis presence not specified - BMP8+EGFR  3. Acquired hypothyroidism - Thyroid Panel With TSH - BMP8+EGFR  4. Vitamin D deficiency - BMP8+EGFR - VITAMIN D 25 Hydroxy (Vit-D Deficiency, Fractures)  5. Liver transplant recipient [facility]) - BMP8+EGFR  6. Insomnia, unspecified type - BMP8+EGFR - ALPRAZolam (XANAX) 0.5 MG tablet; Take 1 tablet (0.5 mg total) by mouth at bedtime as needed.  Dispense: 30 tablet; Refill: 3  7. GAD (generalized anxiety disorder) - BMP8+EGFR - ALPRAZolam (XANAX) 0.5 MG tablet; Take 1 tablet (0.5 mg total) by mouth at bedtime as needed.  Dispense: 30 tablet; Refill: 3  8. Anemia of chronic disease - BMP8+EGFR - Anemia Profile B  9.  Urinary frequency - BMP8+EGFR - Urinalysis, Complete - Urine culture   Continue all meds Labs pending Health Maintenance reviewed Diet and exercise encouraged RTO 6 months   [name], FNP

## 2017-02-26 NOTE — Patient Instructions (Signed)
Health Maintenance, Female Adopting a healthy lifestyle and getting preventive care can go a long way to promote health and wellness. Talk with your health care provider about what schedule of regular examinations is right for you. This is a good chance for you to check in with your provider about disease prevention and staying healthy. In between checkups, there are plenty of things you can do on your own. Experts have done a lot of research about which lifestyle changes and preventive measures are most likely to keep you healthy. Ask your health care provider for more information. Weight and diet Eat a healthy diet  Be sure to include plenty of vegetables, fruits, low-fat dairy products, and lean protein.  Do not eat a lot of foods high in solid fats, added sugars, or salt.  Get regular exercise. This is one of the most important things you can do for your health.  Most adults should exercise for at least 150 minutes each week. The exercise should increase your heart rate and make you sweat (moderate-intensity exercise).  Most adults should also do strengthening exercises at least twice a week. This is in addition to the moderate-intensity exercise. Maintain a healthy weight  Body mass index (BMI) is a measurement that can be used to identify possible weight problems. It estimates body fat based on height and weight. Your health care provider can help determine your BMI and help you achieve or maintain a healthy weight.  For females 76 years of age and older:  A BMI below 18.5 is considered underweight.  A BMI of 18.5 to 24.9 is normal.  A BMI of 25 to 29.9 is considered overweight.  A BMI of 30 and above is considered obese. Watch levels of cholesterol and blood lipids  You should start having your blood tested for lipids and cholesterol at 66 years of age, then have this test every 5 years.  You may need to have your cholesterol levels checked more often if:  Your lipid or  cholesterol levels are high.  You are older than 66 years of age.  You are at high risk for heart disease. Cancer screening Lung Cancer  Lung cancer screening is recommended for adults 64-42 years old who are at high risk for lung cancer because of a history of smoking.  A yearly low-dose CT scan of the lungs is recommended for people who:  Currently smoke.  Have quit within the past 15 years.  Have at least a 30-pack-year history of smoking. A pack year is smoking an average of one pack of cigarettes a day for 1 year.  Yearly screening should continue until it has been 15 years since you quit.  Yearly screening should stop if you develop a health problem that would prevent you from having lung cancer treatment. Breast Cancer  Practice breast self-awareness. This means understanding how your breasts normally appear and feel.  It also means doing regular breast self-exams. Let your health care provider know about any changes, no matter how small.  If you are in your 20s or 30s, you should have a clinical breast exam (CBE) by a health care provider every 1-3 years as part of a regular health exam.  If you are 34 or older, have a CBE every year. Also consider having a breast X-ray (mammogram) every year.  If you have a family history of breast cancer, talk to your health care provider about genetic screening.  If you are at high risk for breast cancer, talk  to your health care provider about having an MRI and a mammogram every year.  Breast cancer gene (BRCA) assessment is recommended for women who have family members with BRCA-related cancers. BRCA-related cancers include:  Breast.  Ovarian.  Tubal.  Peritoneal cancers.  Results of the assessment will determine the need for genetic counseling and BRCA1 and BRCA2 testing. Cervical Cancer  Your health care provider may recommend that you be screened regularly for cancer of the pelvic organs (ovaries, uterus, and vagina).  This screening involves a pelvic examination, including checking for microscopic changes to the surface of your cervix (Pap test). You may be encouraged to have this screening done every 3 years, beginning at age 24.  For women ages 66-65, health care providers may recommend pelvic exams and Pap testing every 3 years, or they may recommend the Pap and pelvic exam, combined with testing for human papilloma virus (HPV), every 5 years. Some types of HPV increase your risk of cervical cancer. Testing for HPV may also be done on women of any age with unclear Pap test results.  Other health care providers may not recommend any screening for nonpregnant women who are considered low risk for pelvic cancer and who do not have symptoms. Ask your health care provider if a screening pelvic exam is right for you.  If you have had past treatment for cervical cancer or a condition that could lead to cancer, you need Pap tests and screening for cancer for at least 20 years after your treatment. If Pap tests have been discontinued, your risk factors (such as having a new sexual partner) need to be reassessed to determine if screening should resume. Some women have medical problems that increase the chance of getting cervical cancer. In these cases, your health care provider may recommend more frequent screening and Pap tests. Colorectal Cancer  This type of cancer can be detected and often prevented.  Routine colorectal cancer screening usually begins at 66 years of age and continues through 66 years of age.  Your health care provider may recommend screening at an earlier age if you have risk factors for colon cancer.  Your health care provider may also recommend using home test kits to check for hidden blood in the stool.  A small camera at the end of a tube can be used to examine your colon directly (sigmoidoscopy or colonoscopy). This is done to check for the earliest forms of colorectal cancer.  Routine  screening usually begins at age 41.  Direct examination of the colon should be repeated every 5-10 years through 66 years of age. However, you may need to be screened more often if early forms of precancerous polyps or small growths are found. Skin Cancer  Check your skin from head to toe regularly.  Tell your health care provider about any new moles or changes in moles, especially if there is a change in a mole's shape or color.  Also tell your health care provider if you have a mole that is larger than the size of a pencil eraser.  Always use sunscreen. Apply sunscreen liberally and repeatedly throughout the day.  Protect yourself by wearing long sleeves, pants, a wide-brimmed hat, and sunglasses whenever you are outside. Heart disease, diabetes, and high blood pressure  High blood pressure causes heart disease and increases the risk of stroke. High blood pressure is more likely to develop in:  People who have blood pressure in the high end of the normal range (130-139/85-89 mm Hg).  People who are overweight or obese.  People who are African American.  If you are 59-24 years of age, have your blood pressure checked every 3-5 years. If you are 34 years of age or older, have your blood pressure checked every year. You should have your blood pressure measured twice-once when you are at a hospital or clinic, and once when you are not at a hospital or clinic. Record the average of the two measurements. To check your blood pressure when you are not at a hospital or clinic, you can use:  An automated blood pressure machine at a pharmacy.  A home blood pressure monitor.  If you are between 29 years and 60 years old, ask your health care provider if you should take aspirin to prevent strokes.  Have regular diabetes screenings. This involves taking a blood sample to check your fasting blood sugar level.  If you are at a normal weight and have a low risk for diabetes, have this test once  every three years after 66 years of age.  If you are overweight and have a high risk for diabetes, consider being tested at a younger age or more often. Preventing infection Hepatitis B  If you have a higher risk for hepatitis B, you should be screened for this virus. You are considered at high risk for hepatitis B if:  You were born in a country where hepatitis B is common. Ask your health care provider which countries are considered high risk.  Your parents were born in a high-risk country, and you have not been immunized against hepatitis B (hepatitis B vaccine).  You have HIV or AIDS.  You use needles to inject street drugs.  You live with someone who has hepatitis B.  You have had sex with someone who has hepatitis B.  You get hemodialysis treatment.  You take certain medicines for conditions, including cancer, organ transplantation, and autoimmune conditions. Hepatitis C  Blood testing is recommended for:  Everyone born from [birth year] through 1965.  Anyone with known risk factors for hepatitis C. Sexually transmitted infections (STIs)  You should be screened for sexually transmitted infections (STIs) including gonorrhea and chlamydia if:  You are sexually active and are younger than 66 years of age.  You are older than 66 years of age and your health care provider tells you that you are at risk for this type of infection.  Your sexual activity has changed since you were last screened and you are at an increased risk for chlamydia or gonorrhea. Ask your health care provider if you are at risk.  If you do not have HIV, but are at risk, it may be recommended that you take a prescription medicine daily to prevent HIV infection. This is called pre-exposure prophylaxis (PrEP). You are considered at risk if:  You are sexually active and do not regularly use condoms or know the HIV status of your partner(s).  You take drugs by injection.  You are sexually active with a partner  who has HIV. Talk with your health care provider about whether you are at high risk of being infected with HIV. If you choose to begin PrEP, you should first be tested for HIV. You should then be tested every 3 months for as long as you are taking PrEP. Pregnancy  If you are premenopausal and you may become pregnant, ask your health care provider about preconception counseling.  If you may become pregnant, take 400 to 800 micrograms (mcg) of folic acid  every day.  If you want to prevent pregnancy, talk to your health care provider about birth control (contraception). Osteoporosis and menopause  Osteoporosis is a disease in which the bones lose minerals and strength with aging. This can result in serious bone fractures. Your risk for osteoporosis can be identified using a bone density scan.  If you are 4 years of age or older, or if you are at risk for osteoporosis and fractures, ask your health care provider if you should be screened.  Ask your health care provider whether you should take a calcium or vitamin D supplement to lower your risk for osteoporosis.  Menopause may have certain physical symptoms and risks.  Hormone replacement therapy may reduce some of these symptoms and risks. Talk to your health care provider about whether hormone replacement therapy is right for you. Follow these instructions at home:  Schedule regular health, dental, and eye exams.  Stay current with your immunizations.  Do not use any tobacco products including cigarettes, chewing tobacco, or electronic cigarettes.  If you are pregnant, do not drink alcohol.  If you are breastfeeding, limit how much and how often you drink alcohol.  Limit alcohol intake to no more than 1 drink per day for nonpregnant women. One drink equals 12 ounces of beer, 5 ounces of wine, or 1 ounces of hard liquor.  Do not use street drugs.  Do not share needles.  Ask your health care provider for help if you need support  or information about quitting drugs.  Tell your health care provider if you often feel depressed.  Tell your health care provider if you have ever been abused or do not feel safe at home. This information is not intended to replace advice given to you by your health care provider. Make sure you discuss any questions you have with your health care provider. Document Released: 05/14/2011 Document Revised: 04/05/2016 Document Reviewed: 08/02/2015 Elsevier Interactive Patient Education  2017 [employer].

## 2017-02-27 ENCOUNTER — Other Ambulatory Visit: Payer: Self-pay | Admitting: Family

## 2017-02-27 LAB — BMP8+EGFR
BUN/Creatinine Ratio: 17 (ref 12–28)
BUN: 16 mg/dL (ref 8–27)
CALCIUM: 8.5 mg/dL — AB (ref 8.7–10.3)
CO2: 22 mmol/L (ref 18–29)
Chloride: 103 mmol/L (ref 96–106)
Creatinine, Ser: 0.92 mg/dL (ref 0.57–1.00)
GFR, EST AFRICAN AMERICAN: 76 mL/min/{1.73_m2} (ref >59)
GFR, EST NON AFRICAN AMERICAN: 66 mL/min/{1.73_m2} (ref >59)
Glucose: 127 mg/dL — ABNORMAL HIGH (ref 65–99)
Potassium: 4.7 mmol/L (ref 3.5–5.2)
Sodium: 140 mmol/L (ref 134–144)

## 2017-02-27 LAB — ANEMIA PROFILE B
BASOS ABS: 0.1 10*3/uL (ref 0.0–0.2)
Basos: 1 %
EOS (ABSOLUTE): 0.2 10*3/uL (ref 0.0–0.4)
Eos: 3 %
Ferritin: 95 ng/mL (ref 15–150)
Folate: >20 ng/mL (ref >3.0)
Hematocrit: 33.5 % — ABNORMAL LOW (ref 34.0–46.6)
Hemoglobin: 10.5 g/dL — ABNORMAL LOW (ref 11.1–15.9)
IMMATURE GRANS (ABS): 0 10*3/uL (ref 0.0–0.1)
Immature Granulocytes: 0 %
Iron Saturation: 15 % (ref 15–55)
Iron: 31 ug/dL (ref 27–139)
LYMPHS: 38 %
Lymphocytes Absolute: 2.4 10*3/uL (ref 0.7–3.1)
MCH: 26.6 pg (ref 26.6–33.0)
MCHC: 31.3 g/dL — ABNORMAL LOW (ref 31.5–35.7)
MCV: 85 fL (ref 79–97)
MONOCYTES: 15 %
Monocytes Absolute: 0.9 10*3/uL (ref 0.1–0.9)
NEUTROS ABS: 2.8 10*3/uL (ref 1.4–7.0)
Neutrophils: 43 %
PLATELETS: 298 10*3/uL (ref 150–379)
RBC: 3.95 x10E6/uL (ref 3.77–5.28)
RDW: 16 % — ABNORMAL HIGH (ref 12.3–15.4)
RETIC CT PCT: 1.7 % (ref 0.6–2.6)
Total Iron Binding Capacity: 212 ug/dL — ABNORMAL LOW (ref 250–450)
UIBC: 181 ug/dL (ref 118–369)
VITAMIN B 12: 379 pg/mL (ref 232–1245)
WBC: 6.4 10*3/uL (ref 3.4–10.8)

## 2017-02-27 LAB — VITAMIN D 25 HYDROXY (VIT D DEFICIENCY, FRACTURES): VIT D 25 HYDROXY: 26.7 ng/mL — AB (ref 30.0–100.0)

## 2017-02-27 LAB — THYROID PANEL WITH TSH
Free Thyroxine Index: 2.1 (ref 1.2–4.9)
T3 Uptake Ratio: 26 % (ref 24–39)
T4, Total: 8.2 ug/dL (ref 4.5–12.0)
TSH: 2.37 u[IU]/mL (ref 0.450–4.500)

## 2017-02-27 MED ORDER — NITROFURANTOIN MONOHYD MACRO 100 MG PO CAPS: 100.0000 mg | ORAL_CAPSULE | Freq: Two times a day (BID) | ORAL | 0 refills | Status: DC

## 2017-02-28 DIAGNOSIS — C44629 Squamous cell carcinoma of skin of left upper limb, including shoulder: Secondary | ICD-10-CM | POA: Diagnosis not present

## 2017-02-28 DIAGNOSIS — L988 Other specified disorders of the skin and subcutaneous tissue: Secondary | ICD-10-CM | POA: Diagnosis not present

## 2017-02-28 LAB — URINE CULTURE

## 2017-03-14 DIAGNOSIS — L570 Actinic keratosis: Secondary | ICD-10-CM | POA: Diagnosis not present

## 2017-03-14 DIAGNOSIS — D044 Carcinoma in situ of skin of scalp and neck: Secondary | ICD-10-CM | POA: Diagnosis not present

## 2017-03-18 DIAGNOSIS — Z944 Liver transplant status: Secondary | ICD-10-CM | POA: Diagnosis not present

## 2017-03-22 ENCOUNTER — Other Ambulatory Visit: Payer: Self-pay | Admitting: Family

## 2017-03-28 ENCOUNTER — Other Ambulatory Visit: Payer: Self-pay

## 2017-03-28 DIAGNOSIS — I10 Essential (primary) hypertension: Secondary | ICD-10-CM

## 2017-03-28 MED ORDER — METOPROLOL SUCCINATE ER 25 MG PO TB24: ORAL_TABLET | ORAL | 1 refills | Status: DC

## 2017-04-01 DIAGNOSIS — Z944 Liver transplant status: Secondary | ICD-10-CM | POA: Diagnosis not present

## 2017-04-04 ENCOUNTER — Other Ambulatory Visit (INDEPENDENT_AMBULATORY_CARE_PROVIDER_SITE_OTHER): Payer: Self-pay | Admitting: *Deleted

## 2017-04-04 ENCOUNTER — Encounter (INDEPENDENT_AMBULATORY_CARE_PROVIDER_SITE_OTHER): Payer: Self-pay | Admitting: *Deleted

## 2017-04-04 ENCOUNTER — Encounter: Payer: Self-pay | Admitting: Family

## 2017-04-04 ENCOUNTER — Ambulatory Visit (INDEPENDENT_AMBULATORY_CARE_PROVIDER_SITE_OTHER): Payer: Medicare Other | Admitting: Family

## 2017-04-04 VITALS — BP 137/80 | HR 74 | Temp 97.4°F | Ht 66.0 in | Wt 140.2 lb

## 2017-04-04 DIAGNOSIS — R300 Dysuria: Secondary | ICD-10-CM

## 2017-04-04 DIAGNOSIS — K743 Primary biliary cirrhosis: Secondary | ICD-10-CM

## 2017-04-04 DIAGNOSIS — N3001 Acute cystitis with hematuria: Secondary | ICD-10-CM

## 2017-04-04 DIAGNOSIS — K7290 Hepatic failure, unspecified without coma: Secondary | ICD-10-CM

## 2017-04-04 DIAGNOSIS — K7682 Hepatic encephalopathy: Secondary | ICD-10-CM

## 2017-04-04 LAB — URINALYSIS, COMPLETE
Bilirubin, UA: NEGATIVE
Glucose, UA: NEGATIVE
KETONES UA: NEGATIVE
NITRITE UA: NEGATIVE
PROTEIN UA: NEGATIVE
Specific Gravity, UA: 1.025 (ref 1.005–1.030)
UUROB: 0.2 mg/dL (ref 0.2–1.0)
pH, UA: 5.5 (ref 5.0–7.5)

## 2017-04-04 LAB — MICROSCOPIC EXAMINATION

## 2017-04-04 MED ORDER — CIPROFLOXACIN HCL 500 MG PO TABS: 500.0000 mg | ORAL_TABLET | Freq: Two times a day (BID) | ORAL | 0 refills | Status: DC

## 2017-04-04 NOTE — Patient Instructions (Signed)

## 2017-04-04 NOTE — Progress Notes (Signed)
   Subjective:    Patient ID: Cassidy Barber, female    DOB: 1951-07-20, 66 y.o.   MRN: [id number]  Dysuria   This is a recurrent problem. The current episode started in the past 7 days. The problem occurs every urination. The problem has been gradually worsening. The quality of the pain is described as burning. The pain is at a severity of 10/10. The pain is mild. Associated symptoms include flank pain, frequency, hesitancy, nausea and urgency. Pertinent negatives include no hematuria or vomiting. She has tried increased fluids for the symptoms. The treatment provided no relief.      Review of Systems  Gastrointestinal: Positive for nausea. Negative for vomiting.  Genitourinary: Positive for dysuria, flank pain, frequency, hesitancy and urgency. Negative for hematuria.  All other systems reviewed and are negative.      Objective:   Physical Exam  Constitutional: She is oriented to person, place, and time. She appears well-developed and well-nourished. No distress.  HENT:  Head: Normocephalic.  Eyes: Pupils are equal, round, and reactive to light.  Cardiovascular: Normal rate, regular rhythm, normal heart sounds and intact distal pulses.   No murmur heard. Pulmonary/Chest: Effort normal and breath sounds normal. No respiratory distress. She has no wheezes.  Abdominal: Soft. Bowel sounds are normal. She exhibits no distension. There is no tenderness.  Musculoskeletal: Normal range of motion. She exhibits no edema or tenderness.  Negative CVA tenderness   Neurological: She is alert and oriented to person, place, and time.  Skin: Skin is warm and dry.  Psychiatric: She has a normal mood and affect. Her behavior is normal. Judgment and thought content normal.  Vitals reviewed.     BP 137/80   Pulse 74   Temp 97.4 F (36.3 C) (Oral)   Ht 5\' 6"  (1.676 m)   Wt 140 lb 3.2 oz (63.6 kg)   BMI 22.63 kg/m      Assessment & Plan:  1. Dysuria - Urinalysis, Complete - Urine  culture  2. Acute cystitis with hematuria Force fluids AZO over the counter X2 days RTO prn Culture pending - ciprofloxacin (CIPRO) 500 MG tablet; Take 1 tablet (500 mg total) by mouth 2 (two) times daily.  Dispense: 14 tablet; Refill: 0   [name], FNP

## 2017-04-05 ENCOUNTER — Ambulatory Visit: Payer: Medicare Other | Admitting: Nurse Practitioner

## 2017-04-07 LAB — URINE CULTURE

## 2017-04-08 ENCOUNTER — Other Ambulatory Visit: Payer: Self-pay | Admitting: Family

## 2017-04-08 ENCOUNTER — Encounter: Payer: Self-pay | Admitting: Family

## 2017-04-08 DIAGNOSIS — I10 Essential (primary) hypertension: Secondary | ICD-10-CM

## 2017-04-09 ENCOUNTER — Other Ambulatory Visit: Payer: Self-pay | Admitting: *Deleted

## 2017-04-09 ENCOUNTER — Other Ambulatory Visit: Payer: Self-pay | Admitting: Family

## 2017-04-09 DIAGNOSIS — I10 Essential (primary) hypertension: Secondary | ICD-10-CM

## 2017-04-09 MED ORDER — AMOXICILLIN-POT CLAVULANATE 875-125 MG PO TABS: 1.0000 | ORAL_TABLET | Freq: Two times a day (BID) | ORAL | 0 refills | Status: DC

## 2017-04-09 MED ORDER — LOSARTAN POTASSIUM 25 MG PO TABS: ORAL_TABLET | ORAL | 0 refills | Status: DC

## 2017-04-12 ENCOUNTER — Encounter: Payer: Self-pay | Admitting: Family

## 2017-04-12 MED ORDER — LOPERAMIDE HCL 2 MG PO CAPS: ORAL_CAPSULE | ORAL | 1 refills | Status: DC

## 2017-04-18 DIAGNOSIS — Z944 Liver transplant status: Secondary | ICD-10-CM | POA: Diagnosis not present

## 2017-04-29 DIAGNOSIS — K743 Primary biliary cirrhosis: Secondary | ICD-10-CM | POA: Diagnosis not present

## 2017-04-29 DIAGNOSIS — K7290 Hepatic failure, unspecified without coma: Secondary | ICD-10-CM | POA: Diagnosis not present

## 2017-04-30 ENCOUNTER — Other Ambulatory Visit (INDEPENDENT_AMBULATORY_CARE_PROVIDER_SITE_OTHER): Payer: Self-pay | Admitting: *Deleted

## 2017-04-30 DIAGNOSIS — K7682 Hepatic encephalopathy: Secondary | ICD-10-CM

## 2017-04-30 DIAGNOSIS — Z85038 Personal history of other malignant neoplasm of large intestine: Secondary | ICD-10-CM

## 2017-04-30 DIAGNOSIS — K7290 Hepatic failure, unspecified without coma: Secondary | ICD-10-CM

## 2017-04-30 DIAGNOSIS — Z944 Liver transplant status: Secondary | ICD-10-CM | POA: Diagnosis not present

## 2017-04-30 LAB — CEA: CEA: 0.7 ng/mL

## 2017-05-14 DIAGNOSIS — Z298 Encounter for other specified prophylactic measures: Secondary | ICD-10-CM | POA: Diagnosis not present

## 2017-05-14 DIAGNOSIS — Z944 Liver transplant status: Secondary | ICD-10-CM | POA: Diagnosis not present

## 2017-05-24 DIAGNOSIS — Z944 Liver transplant status: Secondary | ICD-10-CM | POA: Diagnosis not present

## 2017-05-29 DIAGNOSIS — Z944 Liver transplant status: Secondary | ICD-10-CM | POA: Diagnosis not present

## 2017-06-07 ENCOUNTER — Encounter: Payer: Self-pay | Admitting: Family

## 2017-06-07 ENCOUNTER — Other Ambulatory Visit: Payer: Self-pay | Admitting: Family

## 2017-06-07 DIAGNOSIS — I10 Essential (primary) hypertension: Secondary | ICD-10-CM

## 2017-06-07 MED ORDER — VITAMIN D (ERGOCALCIFEROL) 1.25 MG (50000 UNIT) PO CAPS: 50000.0000 [IU] | ORAL_CAPSULE | ORAL | 3 refills | Status: DC

## 2017-06-07 MED ORDER — LEVOTHYROXINE SODIUM 150 MCG PO TABS
150.0000 ug | ORAL_TABLET | Freq: Every day | ORAL | 1 refills | Status: DC
Start: 2017-06-07 — End: 2017-11-25

## 2017-06-07 MED ORDER — METOPROLOL SUCCINATE ER 25 MG PO TB24: ORAL_TABLET | ORAL | 1 refills | Status: DC

## 2017-06-07 MED ORDER — PANTOPRAZOLE SODIUM 40 MG PO TBEC: 40.0000 mg | DELAYED_RELEASE_TABLET | Freq: Every day | ORAL | 2 refills | Status: DC

## 2017-06-10 ENCOUNTER — Encounter (INDEPENDENT_AMBULATORY_CARE_PROVIDER_SITE_OTHER): Payer: Self-pay | Admitting: *Deleted

## 2017-06-10 DIAGNOSIS — Z298 Encounter for other specified prophylactic measures: Secondary | ICD-10-CM | POA: Diagnosis not present

## 2017-06-10 DIAGNOSIS — Z944 Liver transplant status: Secondary | ICD-10-CM | POA: Diagnosis not present

## 2017-06-14 DIAGNOSIS — L739 Follicular disorder, unspecified: Secondary | ICD-10-CM | POA: Diagnosis not present

## 2017-06-14 DIAGNOSIS — D0461 Carcinoma in situ of skin of right upper limb, including shoulder: Secondary | ICD-10-CM | POA: Diagnosis not present

## 2017-06-14 DIAGNOSIS — C4442 Squamous cell carcinoma of skin of scalp and neck: Secondary | ICD-10-CM | POA: Diagnosis not present

## 2017-06-14 DIAGNOSIS — C44622 Squamous cell carcinoma of skin of right upper limb, including shoulder: Secondary | ICD-10-CM | POA: Diagnosis not present

## 2017-06-14 DIAGNOSIS — C44529 Squamous cell carcinoma of skin of other part of trunk: Secondary | ICD-10-CM | POA: Diagnosis not present

## 2017-06-14 DIAGNOSIS — D492 Neoplasm of unspecified behavior of bone, soft tissue, and skin: Secondary | ICD-10-CM | POA: Diagnosis not present

## 2017-06-25 ENCOUNTER — Ambulatory Visit (INDEPENDENT_AMBULATORY_CARE_PROVIDER_SITE_OTHER): Payer: Medicare Other | Admitting: Internal Medicine

## 2017-06-25 ENCOUNTER — Encounter (INDEPENDENT_AMBULATORY_CARE_PROVIDER_SITE_OTHER): Payer: Self-pay | Admitting: Internal Medicine

## 2017-06-25 ENCOUNTER — Encounter (INDEPENDENT_AMBULATORY_CARE_PROVIDER_SITE_OTHER): Payer: Self-pay | Admitting: *Deleted

## 2017-06-25 ENCOUNTER — Telehealth (INDEPENDENT_AMBULATORY_CARE_PROVIDER_SITE_OTHER): Payer: Self-pay | Admitting: *Deleted

## 2017-06-25 VITALS — BP 124/68 | HR 66 | Temp 97.3°F | Resp 18 | Ht 66.0 in | Wt 140.1 lb

## 2017-06-25 DIAGNOSIS — K529 Noninfective gastroenteritis and colitis, unspecified: Secondary | ICD-10-CM

## 2017-06-25 DIAGNOSIS — D509 Iron deficiency anemia, unspecified: Secondary | ICD-10-CM

## 2017-06-25 DIAGNOSIS — Z85038 Personal history of other malignant neoplasm of large intestine: Secondary | ICD-10-CM | POA: Diagnosis not present

## 2017-06-25 MED ORDER — SUPREP BOWEL PREP KIT 17.5-3.13-1.6 GM/177ML PO SOLN: 1.0000 | Freq: Once | ORAL | 0 refills | Status: AC

## 2017-06-25 NOTE — Progress Notes (Signed)
Presenting complaint;  Follow-up for chronic diarrhea and anemia. Patient states outlined phosphatase is going up.  Database and Subjective:  Patient is 66 year old Caucasian female who has history of PBC and underwent LDLT in January 2015 at [facility]. Her disease was complicated by anastomotic stricture requiring biliary stenting. Her transplant hepatologist Dr. Merrilee Jansky moved to [facility] and she is under his care now. She also has history of colon carcinoma and is had most of her colon removed. He has Lynch syndrome. She has remained in remission. She has chronic diarrhea because of loss of most of her colon. She was last seen 6 months ago.  She states she is doing much better as cirrhosis or stool frequency is concerned. She has anywhere from 3-4 stools per day. Her stools are mostly soft to loose. She has essentially figured out what foods to avoid and what foods to eat. She takes antidiarrheals only when she has to leave house. She has not had any accidents lately. She has lost 4 pounds since her last visit. She states she is full of energy and never gets tired. She denies melena or rectal bleeding. She tells me her liver tests have been abnormal and there is concern if she has recurrent biliary anastomotic stricture. She is scheduled to undergo MRCP in 2 weeks. She is not having abdominal pain or pruritus. She also denies fever or chills.   Current Medications: Outpatient Encounter Prescriptions as of 06/25/2017  Medication Sig  . acetaminophen (TYLENOL) 325 MG tablet Take 650 mg by mouth every 6 (six) hours as needed for mild pain or moderate pain.   [name] ALPRAZolam (XANAX) 0.5 MG tablet Take 1 tablet (0.5 mg total) by mouth at bedtime as needed.  [name] aspirin 325 MG tablet Take 325 mg by mouth daily.  . Cholecalciferol (VITAMIN D3) 2000 UNITS capsule Take 1 capsule by mouth daily.  [name] dicyclomine (BENTYL) 20 MG tablet TAKE ONE TABLET BY MOUTH THREE TIMES A DAY BEFORE  MEALS  . diphenoxylate-atropine (LOMOTIL) 2.5-0.025 MG tablet TAKE 1 TABLET BY MOUTH FOUR TIMES DAILY  . EVEROLIMUS PO 6 mg. Takes 6 Po in the morning , 6 po at night.  . ferrous sulfate 325 (65 FE) MG tablet Take 325 mg by mouth 2 (two) times daily with a meal.  . fluticasone (FLONASE) 50 MCG/ACT nasal spray Place 2 sprays into both nostrils daily.  . hydrOXYzine (VISTARIL) 25 MG capsule Take 25 mg by mouth 3 (three) times daily as needed.   [name] levothyroxine (SYNTHROID, LEVOTHROID) 150 MCG tablet Take 1 tablet (150 mcg total) by mouth daily.  [name] loperamide (IMODIUM) 2 MG capsule TAKE 2 CAPSULES 4 TIMES DAILY AS NEEDED  . losartan (COZAAR) 25 MG tablet TAKE ONE (1) TABLET EACH DAY  . metoprolol succinate (TOPROL-XL) 25 MG 24 hr tablet TAKE ONE (1) TABLET EACH DAY  . mometasone (NASONEX) 50 MCG/ACT nasal spray Place 2 sprays into the nose daily. (Patient taking differently: Place 2 sprays into the nose daily. AS NEEDED)  . mupirocin ointment (BACTROBAN) 2 % Apply 1 application topically as needed. Reported on 11/30/2015  . pantoprazole (PROTONIX) 40 MG tablet Take 1 tablet (40 mg total) by mouth daily before breakfast.  . PREVALITE 4 g packet Take 4 g by mouth daily.   . pseudoephedrine-guaifenesin (MUCINEX D) 60-600 MG 12 hr tablet TAKE 1 TABLET EVERY 12 HOURS AS NEEDED FOR CONGESTION  . tacrolimus (PROGRAF) 1 MG capsule Take 2 mg by mouth. Patient is taking 2  in the morning and 1 at night.  . Vitamin D, Ergocalciferol, (DRISDOL) 50000 units CAPS capsule Take 1 capsule (50,000 Units total) by mouth every 7 (seven) days.  . [DISCONTINUED] amoxicillin-clavulanate (AUGMENTIN) 875-125 MG tablet Take 1 tablet by mouth 2 (two) times daily. (Patient not taking: Reported on 06/25/2017)  . [DISCONTINUED] HYDROcodone-acetaminophen (NORCO/VICODIN) 5-325 MG tablet Take 1 tablet by mouth every 12 (twelve) hours as needed for moderate pain. (Patient not taking: Reported on 06/25/2017)  . [DISCONTINUED] magnesium  oxide (MAG-OX) 400 MG tablet Take 250 mg by mouth daily. Take 1 tablet by mouth daily.   No facility-administered encounter medications on file as of 06/25/2017.      Objective: Blood pressure 124/68, pulse 66, temperature (!) 97.3 F (36.3 C), temperature source Oral, resp. rate 18, height 5\' 6"  (1.676 m), weight 140 lb 1.6 oz (63.5 kg). Patient is alert and in no acute distress. Conjunctiva is pink. Sclera is nonicteric Oropharyngeal mucosa is normal. No neck masses or thyromegaly noted. Cardiac exam with regular rhythm normal S1 and S2. No murmur or gallop noted. Lungs are clear to auscultation. Abdomen is symmetrical with extensive scarring. She has small incisional hernia involving lower midline scar. No organomegaly or masses. No LE edema or clubbing noted.  Labs/studies Results: Lab data from 06/10/2017  H&H 10.4 and 34.7 and MCV 87  WBC 5.5 and platelet count 390K  BUN 27 creatinine 0.93  Glucose 98  Calcium 8.6  Bilirubin less than 0.2, AP 335, AST 41, ALT 32 and albumin 8.7.   Alkaline phosphatase was 235 on 05/14/2017.  Assessment:  #1. Chronic diarrhea secondary to loss of most of her colon. She is doing quite well with dietary measures and when necessary use of antidiarrheals. Symptomatic improvement also would appear to be due to intestinal adaptation.  #2. History of colon carcinoma. She has Lynch syndrome. Last sigmoidoscopy was in August 2017. She is due for surveillance examination.  #3. History of iron deficiency anemia. H&H is gradually coming up.  #4. Elevated alkaline phosphatase. She is asymptomatic. Differential diagnosis includes recurrence of PBC and transplanted liver versus recurrent biliary anastomotic stricture. She is scheduled for MRCP at [facility] after which she is scheduled see Dr. Merrilee Jansky.   Plan:  Will schedule patient for flexible sigmoidoscopy next month. Office visit in 6 months.

## 2017-06-25 NOTE — Patient Instructions (Signed)
Flexible sigmoidoscopy to be scheduled 

## 2017-06-25 NOTE — Telephone Encounter (Signed)
Patient needs suprep 

## 2017-06-26 ENCOUNTER — Other Ambulatory Visit (INDEPENDENT_AMBULATORY_CARE_PROVIDER_SITE_OTHER): Payer: Self-pay | Admitting: *Deleted

## 2017-06-26 DIAGNOSIS — D509 Iron deficiency anemia, unspecified: Secondary | ICD-10-CM

## 2017-06-26 DIAGNOSIS — K529 Noninfective gastroenteritis and colitis, unspecified: Secondary | ICD-10-CM

## 2017-06-26 DIAGNOSIS — Z944 Liver transplant status: Secondary | ICD-10-CM | POA: Diagnosis not present

## 2017-06-27 DIAGNOSIS — Z944 Liver transplant status: Secondary | ICD-10-CM | POA: Diagnosis not present

## 2017-07-10 DIAGNOSIS — K743 Primary biliary cirrhosis: Secondary | ICD-10-CM | POA: Diagnosis not present

## 2017-07-10 DIAGNOSIS — Z944 Liver transplant status: Secondary | ICD-10-CM | POA: Diagnosis not present

## 2017-07-10 DIAGNOSIS — R748 Abnormal levels of other serum enzymes: Secondary | ICD-10-CM | POA: Diagnosis not present

## 2017-07-10 DIAGNOSIS — Z1509 Genetic susceptibility to other malignant neoplasm: Secondary | ICD-10-CM | POA: Diagnosis not present

## 2017-07-16 ENCOUNTER — Encounter: Payer: Self-pay | Admitting: Family

## 2017-07-16 ENCOUNTER — Other Ambulatory Visit: Payer: Self-pay | Admitting: Family

## 2017-07-16 ENCOUNTER — Encounter (INDEPENDENT_AMBULATORY_CARE_PROVIDER_SITE_OTHER): Payer: Self-pay | Admitting: Internal Medicine

## 2017-07-16 DIAGNOSIS — F411 Generalized anxiety disorder: Secondary | ICD-10-CM

## 2017-07-16 DIAGNOSIS — G4700 Insomnia, unspecified: Secondary | ICD-10-CM

## 2017-07-16 MED ORDER — ALPRAZOLAM 0.5 MG PO TABS: 0.5000 mg | ORAL_TABLET | Freq: Every evening | ORAL | 3 refills | Status: DC | PRN

## 2017-07-22 DIAGNOSIS — Z944 Liver transplant status: Secondary | ICD-10-CM | POA: Diagnosis not present

## 2017-07-22 DIAGNOSIS — Z298 Encounter for other specified prophylactic measures: Secondary | ICD-10-CM | POA: Diagnosis not present

## 2017-07-26 DIAGNOSIS — Z23 Encounter for immunization: Secondary | ICD-10-CM | POA: Diagnosis not present

## 2017-07-31 DIAGNOSIS — I10 Essential (primary) hypertension: Secondary | ICD-10-CM | POA: Diagnosis not present

## 2017-07-31 DIAGNOSIS — K838 Other specified diseases of biliary tract: Secondary | ICD-10-CM | POA: Diagnosis not present

## 2017-07-31 DIAGNOSIS — Z1509 Genetic susceptibility to other malignant neoplasm: Secondary | ICD-10-CM | POA: Diagnosis not present

## 2017-07-31 DIAGNOSIS — R748 Abnormal levels of other serum enzymes: Secondary | ICD-10-CM | POA: Diagnosis not present

## 2017-07-31 DIAGNOSIS — E039 Hypothyroidism, unspecified: Secondary | ICD-10-CM | POA: Diagnosis not present

## 2017-07-31 DIAGNOSIS — K831 Obstruction of bile duct: Secondary | ICD-10-CM | POA: Diagnosis not present

## 2017-07-31 DIAGNOSIS — R932 Abnormal findings on diagnostic imaging of liver and biliary tract: Secondary | ICD-10-CM | POA: Diagnosis not present

## 2017-07-31 DIAGNOSIS — Z79899 Other long term (current) drug therapy: Secondary | ICD-10-CM | POA: Diagnosis not present

## 2017-07-31 DIAGNOSIS — Z4659 Encounter for fitting and adjustment of other gastrointestinal appliance and device: Secondary | ICD-10-CM | POA: Diagnosis not present

## 2017-07-31 DIAGNOSIS — T8649 Other complications of liver transplant: Secondary | ICD-10-CM | POA: Diagnosis not present

## 2017-08-05 DIAGNOSIS — Z944 Liver transplant status: Secondary | ICD-10-CM | POA: Diagnosis not present

## 2017-08-07 DIAGNOSIS — C44329 Squamous cell carcinoma of skin of other parts of face: Secondary | ICD-10-CM | POA: Diagnosis not present

## 2017-08-07 DIAGNOSIS — C44722 Squamous cell carcinoma of skin of right lower limb, including hip: Secondary | ICD-10-CM | POA: Diagnosis not present

## 2017-08-07 DIAGNOSIS — C44629 Squamous cell carcinoma of skin of left upper limb, including shoulder: Secondary | ICD-10-CM | POA: Diagnosis not present

## 2017-08-07 DIAGNOSIS — D045 Carcinoma in situ of skin of trunk: Secondary | ICD-10-CM | POA: Diagnosis not present

## 2017-08-07 DIAGNOSIS — C44529 Squamous cell carcinoma of skin of other part of trunk: Secondary | ICD-10-CM | POA: Diagnosis not present

## 2017-08-09 ENCOUNTER — Encounter (HOSPITAL_COMMUNITY)
Admission: RE | Disposition: A | Discharge status: Home / Self Care | Payer: Self-pay | Source: Ambulatory Visit | Attending: Internal Medicine

## 2017-08-09 ENCOUNTER — Ambulatory Visit (HOSPITAL_COMMUNITY)
Admission: RE | Admit: 2017-08-09 | Discharge: 2017-08-09 | Disposition: A | Discharge status: Home / Self Care | Payer: Medicare Other | Source: Ambulatory Visit | Attending: Internal Medicine | Admitting: Internal Medicine

## 2017-08-09 ENCOUNTER — Encounter (HOSPITAL_COMMUNITY): Payer: Self-pay | Admitting: *Deleted

## 2017-08-09 DIAGNOSIS — Z79899 Other long term (current) drug therapy: Secondary | ICD-10-CM | POA: Insufficient documentation

## 2017-08-09 DIAGNOSIS — Z800 Family history of malignant neoplasm of digestive organs: Secondary | ICD-10-CM | POA: Diagnosis not present

## 2017-08-09 DIAGNOSIS — K9189 Other postprocedural complications and disorders of digestive system: Secondary | ICD-10-CM | POA: Diagnosis not present

## 2017-08-09 DIAGNOSIS — Z8042 Family history of malignant neoplasm of prostate: Secondary | ICD-10-CM | POA: Insufficient documentation

## 2017-08-09 DIAGNOSIS — Z885 Allergy status to narcotic agent status: Secondary | ICD-10-CM | POA: Insufficient documentation

## 2017-08-09 DIAGNOSIS — K529 Noninfective gastroenteritis and colitis, unspecified: Secondary | ICD-10-CM

## 2017-08-09 DIAGNOSIS — K6389 Other specified diseases of intestine: Secondary | ICD-10-CM | POA: Diagnosis not present

## 2017-08-09 DIAGNOSIS — I10 Essential (primary) hypertension: Secondary | ICD-10-CM | POA: Diagnosis not present

## 2017-08-09 DIAGNOSIS — Z85038 Personal history of other malignant neoplasm of large intestine: Secondary | ICD-10-CM | POA: Insufficient documentation

## 2017-08-09 DIAGNOSIS — Z1509 Genetic susceptibility to other malignant neoplasm: Secondary | ICD-10-CM | POA: Insufficient documentation

## 2017-08-09 DIAGNOSIS — Z9049 Acquired absence of other specified parts of digestive tract: Secondary | ICD-10-CM | POA: Diagnosis not present

## 2017-08-09 DIAGNOSIS — K6289 Other specified diseases of anus and rectum: Secondary | ICD-10-CM | POA: Diagnosis not present

## 2017-08-09 DIAGNOSIS — E079 Disorder of thyroid, unspecified: Secondary | ICD-10-CM | POA: Insufficient documentation

## 2017-08-09 DIAGNOSIS — D509 Iron deficiency anemia, unspecified: Secondary | ICD-10-CM

## 2017-08-09 DIAGNOSIS — Z881 Allergy status to other antibiotic agents status: Secondary | ICD-10-CM | POA: Diagnosis not present

## 2017-08-09 DIAGNOSIS — D120 Benign neoplasm of cecum: Secondary | ICD-10-CM | POA: Diagnosis not present

## 2017-08-09 DIAGNOSIS — Z08 Encounter for follow-up examination after completed treatment for malignant neoplasm: Secondary | ICD-10-CM | POA: Diagnosis not present

## 2017-08-09 DIAGNOSIS — Z944 Liver transplant status: Secondary | ICD-10-CM | POA: Diagnosis not present

## 2017-08-09 HISTORY — PX: POLYPECTOMY: SHX5525

## 2017-08-09 HISTORY — PX: FLEXIBLE SIGMOIDOSCOPY: SHX5431

## 2017-08-09 SURGERY — SIGMOIDOSCOPY, FLEXIBLE
Anesthesia: Moderate Sedation

## 2017-08-09 MED ORDER — STERILE WATER FOR IRRIGATION IR SOLN
Status: DC | PRN
  Administered 2017-08-09: 13:00:00

## 2017-08-09 MED ORDER — MIDAZOLAM HCL 5 MG/5ML IJ SOLN
INTRAMUSCULAR | Status: DC | PRN
  Administered 2017-08-09 (×2): 2 mg via INTRAVENOUS

## 2017-08-09 MED ORDER — MEPERIDINE HCL 50 MG/ML IJ SOLN
INTRAMUSCULAR | Status: AC
  Filled 2017-08-09: qty 1

## 2017-08-09 MED ORDER — MIDAZOLAM HCL 5 MG/5ML IJ SOLN
INTRAMUSCULAR | Status: AC
  Filled 2017-08-09: qty 10

## 2017-08-09 MED ORDER — MEPERIDINE HCL 25 MG/ML IJ SOLN
INTRAMUSCULAR | Status: DC | PRN
  Administered 2017-08-09 (×2): 25 mg via INTRAVENOUS

## 2017-08-09 MED ORDER — SODIUM CHLORIDE 0.9 % IV SOLN
INTRAVENOUS | Status: DC
  Administered 2017-08-09: 1000 mL via INTRAVENOUS

## 2017-08-09 NOTE — H&P (Signed)
Cassidy Barber is an 66 y.o. female.   Chief Complaint: Patient is here for flexible sigmoidoscopy. HPI: patient is 59 old [place] female was history of colon carcinoma and Lynch syndrome and is here for surveillance flexible sigmoidoscopy. She has rec and distal segment of sigmoid colon in situ. She remains with 3-4 bwel movements per day. She denies melena or rectal bleeding. Last sigmoidoscopy was in August 2017. She had biliary stenting last week at [facility] for cholestasis felt to be d to biliary anastomotic stricture. She was also begun on Urso.  Past Medical History:  Diagnosis Date  . Anemia of chronic disease 10/26/2013  . Hernia of unspecified site of abdominal cavity without mention of obstruction or gangrene   . Hypertension   . Liver disease   . Lynch syndrome   . Primary biliary cirrhosis ([place]) 10/26/2013  . Thyroid disease     Past Surgical History:  Procedure Laterality Date  . ABDOMINAL HERNIA REPAIR     Patient's states that she has had 8- 9 hernia surgeries  . ABDOMINAL HYSTERECTOMY    . CHOLECYSTECTOMY  2007  . COLON RESECTION    . COLON SURGERY  2008   Done at [facility]  . COLONOSCOPY     Done at [facility]  . FLEXIBLE SIGMOIDOSCOPY N/A 10/20/2015   Procedure: FLEXIBLE SIGMOIDOSCOPY;  Surgeon: Rogene Houston, MD;  Location: [facility];  Service: Endoscopy;  Laterality: N/A;  36 - Dr Laural Golden has meeting until 1:00  . FLEXIBLE SIGMOIDOSCOPY N/A 07/11/2016   Procedure: FLEXIBLE SIGMOIDOSCOPY;  Surgeon: Rogene Houston, MD;  Location: [facility];  Service: Endoscopy;  Laterality: N/A;  1200  . HERNIA REPAIR    . LIVER TRANSPLANT  [id number]  . SPLENECTOMY  2006  . UPPER GASTROINTESTINAL ENDOSCOPY     Done at [facility]    Family History  Problem Relation Age of Onset  . Prostate cancer Father   . Colon cancer Sister   . Healthy Son    Social History:  reports that she has never smoked. She has never used smokeless tobacco. She reports that she does not drink alcohol or use  drugs.  Allergies:  Allergies  Allergen Reactions  . Codeine Nausea Only  . Cephalexin Swelling    Swelling of arms, face, hands, per patient finished rest of bottle. Tolerated zosyn    Medications Prior to Admission  Medication Sig Dispense Refill  . acetaminophen (TYLENOL) 325 MG tablet Take 650 mg by mouth every 6 (six) hours as needed for mild pain or moderate pain.     [name] ALPRAZolam (XANAX) 0.5 MG tablet Take 1 tablet (0.5 mg total) by mouth at bedtime as needed. 30 tablet 3  . aspirin 325 MG tablet Take 325 mg by mouth daily.    . Cholecalciferol (VITAMIN D3) 2000 UNITS capsule Take 1 capsule by mouth daily.    [name] EVEROLIMUS PO 6 mg. Takes 6 Po in the morning , 6 po at night.    . ferrous sulfate 325 (65 FE) MG tablet Take 325 mg by mouth 2 (two) times daily with a meal.    . levothyroxine (SYNTHROID, LEVOTHROID) 150 MCG tablet Take 1 tablet (150 mcg total) by mouth daily. 90 tablet 1  . loperamide (IMODIUM) 2 MG capsule TAKE 2 CAPSULES 4 TIMES DAILY AS NEEDED 90 capsule 1  . losartan (COZAAR) 25 MG tablet TAKE ONE (1) TABLET EACH DAY 90 tablet 0  . magnesium oxide (MAG-OX) 400 MG tablet Take 400  mg by mouth daily.    . metoprolol succinate (TOPROL-XL) 25 MG 24 hr tablet TAKE ONE (1) TABLET EACH DAY 90 tablet 1  . pantoprazole (PROTONIX) 40 MG tablet Take 1 tablet (40 mg total) by mouth daily before breakfast. 90 tablet 2  . potassium chloride SA (K-DUR,KLOR-CON) 20 MEQ tablet Take 20 mEq by mouth 2 (two) times daily.    . tacrolimus (PROGRAF) 1 MG capsule Take 1-2 mg by mouth. Patient is taking 2 in the morning and 1 at night.    . ursodiol (ACTIGALL) 300 MG capsule Take 300 mg by mouth 2 (two) times daily.    . Vitamin D, Ergocalciferol, (DRISDOL) 50000 units CAPS capsule Take 1 capsule (50,000 Units total) by mouth every 7 (seven) days. (Patient taking differently: Take 50,000 Units by mouth every 7 (seven) days. thursday) 12 capsule 3  . dicyclomine (BENTYL) 20 MG tablet TAKE ONE  TABLET BY MOUTH THREE TIMES A DAY BEFORE MEALS 90 tablet 5  . diphenoxylate-atropine (LOMOTIL) 2.5-0.025 MG tablet TAKE 1 TABLET BY MOUTH FOUR TIMES DAILY 90 tablet 2  . fluticasone (FLONASE) 50 MCG/ACT nasal spray Place 2 sprays into both nostrils daily. (Patient taking differently: Place 2 sprays into both nostrils daily as needed. ) 16 g 6  . hydrOXYzine (VISTARIL) 25 MG capsule Take 25 mg by mouth 3 (three) times daily as needed.     . mupirocin ointment (BACTROBAN) 2 % Apply 1 application topically as needed. Reported on 11/30/2015  1  . PREVALITE 4 g packet Take 4 g by mouth daily as needed.   11    No results found for this or any previous visit (from the past 48 hour(s)). No results found.  ROS  Blood pressure (!) 157/86, pulse 72, temperature 98.2 F (36.8 C), temperature source Oral, resp. rate 20, height 5\' 4"  (1.626 m), weight 135 lb (61.2 kg), SpO2 100 %. Physical Exam  Constitutional: She appears well-developed and well-nourished.  HENT:  Mouth/Throat: Oropharynx is clear and moist.  Eyes: Conjunctivae are normal. No scleral icterus.  Neck: No thyromegaly present.  Cardiovascular: Normal rate, regular rhythm and normal heart sounds.   No murmur heard. Respiratory: Effort normal and breath sounds normal.  GI:  Long right mid abdominal scar along with long midline scar.No organomegaly or masses.  Musculoskeletal: She exhibits no edema.  Lymphadenopathy:    She has no cervical adenopathy.  Neurological: She is alert.  Skin: Skin is warm and dry.     Assessment/Plan History of colon carcinoma. Lynch syndrome. Surveillance flexible sigmoidoscopy.  Hildred Laser, MD 08/09/2017, 1:03 PM

## 2017-08-09 NOTE — Discharge Instructions (Signed)
Resume aspirin on 08/11/2017. Resume other medications and diet as before.. No driving for 24 hours. Physician will call with biopsy results.   Colonoscopy, Adult, Care After This sheet gives you information about how to care for yourself after your procedure. Your doctor may also give you more specific instructions. If you have problems or questions, call your doctor. Follow these instructions at home: General instructions   For the first 24 hours after the procedure: ? Do not drive or use machinery. ? Do not sign important documents. ? Do not drink alcohol. ? Do your daily activities more slowly than normal. ? Eat foods that are soft and easy to digest. ? Rest often.  Take over-the-counter or prescription medicines only as told by your doctor.  It is up to you to get the results of your procedure. Ask your doctor, or the department performing the procedure, when your results will be ready. To help cramping and bloating:  Try walking around.  Put heat on your belly (abdomen) as told by your doctor. Use a heat source that your doctor recommends, such as a moist heat pack or a heating pad. ? Put a towel between your skin and the heat source. ? Leave the heat on for 20-30 minutes. ? Remove the heat if your skin turns bright red. This is especially important if you cannot feel pain, heat, or cold. You can get burned. Eating and drinking  Drink enough fluid to keep your pee (urine) clear or pale yellow.  Return to your normal diet as told by your doctor. Avoid heavy or fried foods that are hard to digest.  Avoid drinking alcohol for as long as told by your doctor. Contact a doctor if:  You have blood in your poop (stool) 2-3 days after the procedure. Get help right away if:  You have more than a small amount of blood in your poop.  You see large clumps of tissue (blood clots) in your poop.  Your belly is swollen.  You feel sick to your stomach (nauseous).  You throw up  (vomit).  You have a fever.  You have belly pain that gets worse, and medicine does not help your pain. This information is not intended to replace advice given to you by your health care provider. Make sure you discuss any questions you have with your health care provider. Document Released: 12/01/2010 Document Revised: 07/23/2016 Document Reviewed: 07/23/2016 Elsevier Interactive Patient Education  2017 [place].  Colon Polyps Polyps are tissue growths inside the body. Polyps can grow in many places, including the large intestine (colon). A polyp may be a round bump or a mushroom-shaped growth. You could have one polyp or several. Most colon polyps are noncancerous (benign). However, some colon polyps can become cancerous over time. What are the causes? The exact cause of colon polyps is not known. What increases the risk? This condition is more likely to develop in people who:  Have a family history of colon cancer or colon polyps.  Are older than 57 or older than 45 if they are African American.  Have inflammatory bowel disease, such as ulcerative colitis or Crohn disease.  Are overweight.  Smoke cigarettes.  Do not get enough exercise.  Drink too much alcohol.  Eat a diet that is: ? High in fat and red meat. ? Low in fiber.  Had childhood cancer that was treated with abdominal radiation.  What are the signs or symptoms? Most polyps do not cause symptoms. If you have  symptoms, they may include:  Blood coming from your rectum when having a bowel movement.  Blood in your stool.The stool may look dark red or black.  A change in bowel habits, such as constipation or diarrhea.  How is this diagnosed? This condition is diagnosed with a colonoscopy. This is a procedure that uses a lighted, flexible scope to look at the inside of your colon. How is this treated? Treatment for this condition involves removing any polyps that are found. Those polyps will then be  tested for cancer. If cancer is found, your health care provider will talk to you about options for colon cancer treatment. Follow these instructions at home: Diet  Eat plenty of fiber, such as fruits, vegetables, and whole grains.  Eat foods that are high in calcium and vitamin D, such as milk, cheese, yogurt, eggs, liver, fish, and broccoli.  Limit foods high in fat, red meats, and processed meats, such as hot dogs, sausage, bacon, and lunch meats.  Maintain a healthy weight, or lose weight if recommended by your health care provider. General instructions  Do not smoke cigarettes.  Do not drink alcohol excessively.  Keep all follow-up visits as told by your health care provider. This is important. This includes keeping regularly scheduled colonoscopies. Talk to your health care provider about when you need a colonoscopy.  Exercise every day or as told by your health care provider. Contact a health care provider if:  You have new or worsening bleeding during a bowel movement.  You have new or increased blood in your stool.  You have a change in bowel habits.  You unexpectedly lose weight. This information is not intended to replace advice given to you by your health care provider. Make sure you discuss any questions you have with your health care provider. Document Released: 07/25/2004 Document Revised: 04/05/2016 Document Reviewed: 09/19/2015 Elsevier Interactive Patient Education  [employer].

## 2017-08-09 NOTE — Op Note (Signed)
[facility] Patient Name: Cassidy Barber Procedure Date: 08/09/2017 12:30 PM MRN: [id number] Date of Birth: 02-26-1951 Attending MD: Hildred Laser , MD CSN: [id number] Age: 66 Admit Type: Outpatient Procedure:                Flexible Sigmoidoscopy Indications:              High risk colon cancer surveillance: Personal                            history of colon cancer Providers:                Hildred Laser, MD, Otis Peak B. Sharon Seller, RN, Aram Candela Referring MD:             Theador Hawthorne Hawks, NP Medicines:                Meperidine 50 mg IV, Midazolam 4 mg IV Complications:            No immediate complications. Estimated Blood Loss:     Estimated blood loss was minimal. Procedure:                Pre-Anesthesia Assessment:                           - Prior to the procedure, a History and Physical                            was performed, and patient medications and                            allergies were reviewed. The patient's tolerance of                            previous anesthesia was also reviewed. The risks                            and benefits of the procedure and the sedation                            options and risks were discussed with the patient.                            All questions were answered, and informed consent                            was obtained. Prior Anticoagulants: The patient                            last took aspirin 2 days prior to the procedure.                            ASA Grade Assessment: III - A patient with severe  systemic disease. After reviewing the risks and                            benefits, the patient was deemed in satisfactory                            condition to undergo the procedure.                           After obtaining informed consent, the scope was                            passed under direct vision. The [id number]                            ([id number])  scope was introduced through the anus and                            advanced to the the ileo-sigmoid anastomosis. The                            flexible sigmoidoscopy was accomplished without                            difficulty. The patient tolerated the procedure                            well. The quality of the bowel preparation was                            excellent. Scope In: 1:13:36 PM Scope Out: 1:26:05 PM Total Procedure Duration: 0 hours 12 minutes 29 seconds  Findings:      The perianal and digital rectal examinations were normal.      There was evidence of a prior end-to-end ileo-colonic anastomosis at 24       cm proximal to the anus. This was patent and was characterized by       ulcerated polyp at anastomosis. The anastomosis was traversed after       dilation. Biopsies were taken with a cold forceps for histology. The       pathology specimen was placed into Bottle Number 1.      A small polyp with surface erosion was found in distal small bowel       proximal tonastomosis. Biopsy taken.      The rectum and distal sigmoid colon appeared normal.      Anal papilla(e) were hypertrophied. Impression:               - Patent end-to-end ileo-colonic anastomosis                            located 24 cm from the anal margin, characterized                            by ulcerated polyp at anastomosis. Biopsied.                           -  One small polypwith surface erosion proximal to                            ileocolonic anastomosis. Biopsied.                           - The rectum and distal sigmoid colon are normal.                           - Anal papilla(e) were hypertrophied. Moderate Sedation:      Moderate (conscious) sedation was administered by the endoscopy nurse       and supervised by the endoscopist. The following parameters were       monitored: oxygen saturation, heart rate, blood pressure, CO2       capnography and response to care. Total physician  intraservice time was       17 minutes. Recommendation:           - Patient has a contact number available for                            emergencies. The signs and symptoms of potential                            delayed complications were discussed with the                            patient. Return to normal activities tomorrow.                            Written discharge instructions were provided to the                            patient.                           - Resume previous diet today.                           - Continue present medications.                           - Resume aspirin at prior dose 08/11/2017.                           - Await pathology results.                           - Repeat exam in one year. Procedure Code(s):        --- Professional ---                           [phone], Sigmoidoscopy, flexible; with biopsy, single                            or multiple  [id number], Moderate sedation services provided by the                            same physician or other qualified health care                            professional performing the diagnostic or                            therapeutic service that the sedation supports,                            requiring the presence of an independent trained                            observer to assist in the monitoring of the                            patient's level of consciousness and physiological                            status; initial 15 minutes of intraservice time,                            patient age 27 years or older Diagnosis Code(s):        --- Professional ---                           [id number], Personal history of other malignant                            neoplasm of large intestine                           D12.0, Benign neoplasm of cecum                           K62.89, Other specified diseases of anus and rectum CPT copyright 2016 [employer]. All rights  reserved. The codes documented in this report are preliminary and upon coder review may  be revised to meet current compliance requirements. Hildred Laser, MD Hildred Laser, MD 08/09/2017 1:42:16 PM This report has been signed electronically. Number of Addenda: 0

## 2017-08-14 ENCOUNTER — Encounter (HOSPITAL_COMMUNITY): Payer: Self-pay | Admitting: Internal Medicine

## 2017-08-19 DIAGNOSIS — Z944 Liver transplant status: Secondary | ICD-10-CM | POA: Diagnosis not present

## 2017-08-19 DIAGNOSIS — Z298 Encounter for other specified prophylactic measures: Secondary | ICD-10-CM | POA: Diagnosis not present

## 2017-09-02 ENCOUNTER — Encounter: Payer: Self-pay | Admitting: Physician Assistant

## 2017-09-02 ENCOUNTER — Ambulatory Visit (INDEPENDENT_AMBULATORY_CARE_PROVIDER_SITE_OTHER): Payer: Medicare Other | Admitting: Physician Assistant

## 2017-09-02 VITALS — BP 139/74 | HR 77 | Temp 98.8°F | Ht 64.0 in | Wt 143.2 lb

## 2017-09-02 DIAGNOSIS — R300 Dysuria: Secondary | ICD-10-CM

## 2017-09-02 DIAGNOSIS — N3001 Acute cystitis with hematuria: Secondary | ICD-10-CM

## 2017-09-02 LAB — URINALYSIS
Bilirubin, UA: NEGATIVE
Glucose, UA: NEGATIVE
Ketones, UA: NEGATIVE
NITRITE UA: NEGATIVE
Protein, UA: NEGATIVE
SPEC GRAV UA: 1.02 (ref 1.005–1.030)
UUROB: 0.2 mg/dL (ref 0.2–1.0)
pH, UA: 5.5 (ref 5.0–7.5)

## 2017-09-02 MED ORDER — HYDROCODONE-ACETAMINOPHEN 10-325 MG PO TABS: 1.0000 | ORAL_TABLET | ORAL | 0 refills | Status: DC | PRN

## 2017-09-02 MED ORDER — CIPROFLOXACIN HCL 500 MG PO TABS: 500.0000 mg | ORAL_TABLET | Freq: Two times a day (BID) | ORAL | 0 refills | Status: DC

## 2017-09-02 NOTE — Progress Notes (Signed)
BP 139/74   Pulse 77   Temp 98.8 F (37.1 C) (Oral)   Ht 5\' 4"  (1.626 m)   Wt 143 lb 3.2 oz (65 kg)   BMI 24.58 kg/m    Subjective:    Patient ID: Cassidy Barber, female    DOB: Mar 21, 1951, 66 y.o.   MRN: [id number]  HPI: [name] is a 66 y.o. female presenting on 09/02/2017 for Urinary Tract Infection; Urinary Frequency; and Back Pain (low )  This patient has had several days of dysuria, frequency and nocturia. There is also pain over the bladder in the suprapubic region, no back pain. Denies leakage or hematuria.  Denies fever or chills. No pain in flank area.  Relevant past medical, surgical, family and social history reviewed and updated as indicated. Allergies and medications reviewed and updated.  Past Medical History:  Diagnosis Date  . Anemia of chronic disease 10/26/2013  . Hernia of unspecified site of abdominal cavity without mention of obstruction or gangrene   . Hypertension   . Liver disease   . Lynch syndrome   . Primary biliary cirrhosis ([place]) 10/26/2013  . Thyroid disease     Past Surgical History:  Procedure Laterality Date  . ABDOMINAL HERNIA REPAIR     Patient's states that she has had 8- 9 hernia surgeries  . ABDOMINAL HYSTERECTOMY    . CHOLECYSTECTOMY  2007  . COLON RESECTION    . COLON SURGERY  2008   Done at [facility]  . COLONOSCOPY     Done at [facility]  . FLEXIBLE SIGMOIDOSCOPY N/A 10/20/2015   Procedure: FLEXIBLE SIGMOIDOSCOPY;  Surgeon: Rogene Houston, MD;  Location: [facility];  Service: Endoscopy;  Laterality: N/A;  77 - Dr Laural Golden has meeting until 1:00  . FLEXIBLE SIGMOIDOSCOPY N/A 07/11/2016   Procedure: FLEXIBLE SIGMOIDOSCOPY;  Surgeon: Rogene Houston, MD;  Location: [facility];  Service: Endoscopy;  Laterality: N/A;  1200  . FLEXIBLE SIGMOIDOSCOPY N/A 08/09/2017   Procedure: FLEXIBLE SIGMOIDOSCOPY;  Surgeon: Rogene Houston, MD;  Location: [facility];  Service: Endoscopy;  Laterality: N/A;  1:00  . HERNIA REPAIR    . LIVER  TRANSPLANT  [id number]  . POLYPECTOMY  08/09/2017   Procedure: POLYPECTOMY;  Surgeon: Rogene Houston, MD;  Location: [facility];  Service: Endoscopy;;  colon small bowel  . SPLENECTOMY  2006  . UPPER GASTROINTESTINAL ENDOSCOPY     Done at [facility]    Review of Systems  Constitutional: Negative.   HENT: Negative.   Eyes: Negative.   Respiratory: Negative.   Gastrointestinal: Positive for diarrhea.  Genitourinary: Positive for difficulty urinating, dysuria and urgency. Negative for flank pain.    Allergies as of 09/02/2017      Reactions   Codeine Nausea Only   Oxycodone Nausea Only   Cephalexin Swelling   Swelling of arms, face, hands, per patient finished rest of bottle. Tolerated zosyn      Medication List       Accurate as of 09/02/17  4:39 PM. Always use your most recent med list.          acetaminophen 325 MG tablet Commonly known as:  TYLENOL Take 650 mg by mouth every 6 (six) hours as needed for mild pain or moderate pain.   ALPRAZolam 0.5 MG tablet Commonly known as:  XANAX Take 1 tablet (0.5 mg total) by mouth at bedtime as needed.   aspirin EC 81 MG tablet Take 81 mg by mouth  daily.   ciprofloxacin 500 MG tablet Commonly known as:  CIPRO Take 1 tablet (500 mg total) by mouth 2 (two) times daily.   dicyclomine 20 MG tablet Commonly known as:  BENTYL TAKE ONE TABLET BY MOUTH THREE TIMES A DAY BEFORE MEALS   diphenoxylate-atropine 2.5-0.025 MG tablet Commonly known as:  LOMOTIL TAKE 1 TABLET BY MOUTH FOUR TIMES DAILY   EVEROLIMUS PO 6 mg. Takes 6 Po in the morning , 6 po at night.   ferrous sulfate 325 (65 FE) MG tablet Take 325 mg by mouth 2 (two) times daily with a meal.   fluticasone 50 MCG/ACT nasal spray Commonly known as:  FLONASE Place 2 sprays into both nostrils daily.   HYDROcodone-acetaminophen 10-325 MG tablet Commonly known as:  NORCO Take 1 tablet by mouth every 4 (four) hours as needed.   hydrOXYzine 25 MG capsule Commonly known  as:  VISTARIL Take 25 mg by mouth 3 (three) times daily as needed.   levothyroxine 150 MCG tablet Commonly known as:  SYNTHROID, LEVOTHROID Take 1 tablet (150 mcg total) by mouth daily.   loperamide 2 MG capsule Commonly known as:  IMODIUM TAKE 2 CAPSULES 4 TIMES DAILY AS NEEDED   losartan 25 MG tablet Commonly known as:  COZAAR TAKE ONE (1) TABLET EACH DAY   magnesium oxide 400 MG tablet Commonly known as:  MAG-OX Take 400 mg by mouth daily.   metoprolol succinate 25 MG 24 hr tablet Commonly known as:  TOPROL-XL TAKE ONE (1) TABLET EACH DAY   mupirocin ointment 2 % Commonly known as:  BACTROBAN Apply 1 application topically as needed. Reported on 11/30/2015   pantoprazole 40 MG tablet Commonly known as:  PROTONIX Take 1 tablet (40 mg total) by mouth daily before breakfast.   potassium chloride SA 20 MEQ tablet Commonly known as:  K-DUR,KLOR-CON Take 20 mEq by mouth 2 (two) times daily.   PREVALITE 4 g packet Generic drug:  cholestyramine light Take 4 g by mouth daily as needed.   tacrolimus 1 MG capsule Commonly known as:  PROGRAF Take 1-2 mg by mouth. Patient is taking 2 in the morning and 1 at night.   ursodiol 250 MG tablet Commonly known as:  ACTIGALL Take 250 mg by mouth 2 (two) times daily.   Vitamin D (Ergocalciferol) 50000 units Caps capsule Commonly known as:  DRISDOL Take 1 capsule (50,000 Units total) by mouth every 7 (seven) days.   Vitamin D3 2000 units capsule Take 1 capsule by mouth daily.          Objective:    BP 139/74   Pulse 77   Temp 98.8 F (37.1 C) (Oral)   Ht 5\' 4"  (1.626 m)   Wt 143 lb 3.2 oz (65 kg)   BMI 24.58 kg/m   Allergies  Allergen Reactions  . Codeine Nausea Only  . Oxycodone Nausea Only  . Cephalexin Swelling    Swelling of arms, face, hands, per patient finished rest of bottle. Tolerated zosyn    Physical Exam  Constitutional: She is oriented to person, place, and time. She appears well-developed and  well-nourished.  HENT:  Head: Normocephalic and atraumatic.  Eyes: Pupils are equal, round, and reactive to light. Conjunctivae are normal.  Cardiovascular: Normal rate, regular rhythm, normal heart sounds and intact distal pulses.   Pulmonary/Chest: Effort normal and breath sounds normal.  Abdominal: Soft. Bowel sounds are normal. She exhibits no distension and no mass. There is tenderness in the suprapubic area. There  is no rebound, no guarding and no CVA tenderness.  Neurological: She is alert and oriented to person, place, and time. She has normal reflexes.  Skin: Skin is warm and dry. No rash noted.  Psychiatric: She has a normal mood and affect. Her behavior is normal. Judgment and thought content normal.    Results for orders placed or performed in visit on 09/02/17  Urinalysis  Result Value Ref Range   Specific Gravity, UA 1.020 1.005 - 1.030   pH, UA 5.5 5.0 - 7.5   Color, UA Yellow Yellow   Appearance Ur Clear Clear   Leukocytes, UA 1+ (A) Negative   Protein, UA Negative Negative/Trace   Glucose, UA Negative Negative   Ketones, UA Negative Negative   RBC, UA 2+ (A) Negative   Bilirubin, UA Negative Negative   Urobilinogen, Ur 0.2 0.2 - 1.0 mg/dL   Nitrite, UA Negative Negative      Assessment & Plan:   1. Dysuria - Urine Culture - aspirin EC 81 MG tablet; Take 81 mg by mouth daily.  2. Acute cystitis with hematuria - Urinalysis    Current Outpatient Prescriptions:  .  acetaminophen (TYLENOL) 325 MG tablet, Take 650 mg by mouth every 6 (six) hours as needed for mild pain or moderate pain. , Disp: , Rfl:  .  ALPRAZolam (XANAX) 0.5 MG tablet, Take 1 tablet (0.5 mg total) by mouth at bedtime as needed., Disp: 30 tablet, Rfl: 3 .  aspirin EC 81 MG tablet, Take 81 mg by mouth daily., Disp: , Rfl:  .  Cholecalciferol (VITAMIN D3) 2000 UNITS capsule, Take 1 capsule by mouth daily., Disp: , Rfl:  .  dicyclomine (BENTYL) 20 MG tablet, TAKE ONE TABLET BY MOUTH THREE TIMES  A DAY BEFORE MEALS, Disp: 90 tablet, Rfl: 5 .  diphenoxylate-atropine (LOMOTIL) 2.5-0.025 MG tablet, TAKE 1 TABLET BY MOUTH FOUR TIMES DAILY, Disp: 90 tablet, Rfl: 2 .  EVEROLIMUS PO, 6 mg. Takes 6 Po in the morning , 6 po at night., Disp: , Rfl:  .  ferrous sulfate 325 (65 FE) MG tablet, Take 325 mg by mouth 2 (two) times daily with a meal., Disp: , Rfl:  .  fluticasone (FLONASE) 50 MCG/ACT nasal spray, Place 2 sprays into both nostrils daily. (Patient taking differently: Place 2 sprays into both nostrils daily as needed. ), Disp: 16 g, Rfl: 6 .  hydrOXYzine (VISTARIL) 25 MG capsule, Take 25 mg by mouth 3 (three) times daily as needed. , Disp: , Rfl:  .  levothyroxine (SYNTHROID, LEVOTHROID) 150 MCG tablet, Take 1 tablet (150 mcg total) by mouth daily., Disp: 90 tablet, Rfl: 1 .  loperamide (IMODIUM) 2 MG capsule, TAKE 2 CAPSULES 4 TIMES DAILY AS NEEDED, Disp: 90 capsule, Rfl: 1 .  losartan (COZAAR) 25 MG tablet, TAKE ONE (1) TABLET EACH DAY, Disp: 90 tablet, Rfl: 0 .  magnesium oxide (MAG-OX) 400 MG tablet, Take 400 mg by mouth daily., Disp: , Rfl:  .  metoprolol succinate (TOPROL-XL) 25 MG 24 hr tablet, TAKE ONE (1) TABLET EACH DAY, Disp: 90 tablet, Rfl: 1 .  mupirocin ointment (BACTROBAN) 2 %, Apply 1 application topically as needed. Reported on 11/30/2015, Disp: , Rfl: 1 .  pantoprazole (PROTONIX) 40 MG tablet, Take 1 tablet (40 mg total) by mouth daily before breakfast., Disp: 90 tablet, Rfl: 2 .  potassium chloride SA (K-DUR,KLOR-CON) 20 MEQ tablet, Take 20 mEq by mouth 2 (two) times daily., Disp: , Rfl:  .  PREVALITE 4 g  packet, Take 4 g by mouth daily as needed. , Disp: , Rfl: 11 .  tacrolimus (PROGRAF) 1 MG capsule, Take 1-2 mg by mouth. Patient is taking 2 in the morning and 1 at night., Disp: , Rfl:  .  ursodiol (ACTIGALL) 250 MG tablet, Take 250 mg by mouth 2 (two) times daily., Disp: , Rfl:  .  Vitamin D, Ergocalciferol, (DRISDOL) 50000 units CAPS capsule, Take 1 capsule (50,000 Units  total) by mouth every 7 (seven) days. (Patient taking differently: Take 50,000 Units by mouth every 7 (seven) days. thursday), Disp: 12 capsule, Rfl: 3 .  ciprofloxacin (CIPRO) 500 MG tablet, Take 1 tablet (500 mg total) by mouth 2 (two) times daily., Disp: 14 tablet, Rfl: 0 .  HYDROcodone-acetaminophen (NORCO) 10-325 MG tablet, Take 1 tablet by mouth every 4 (four) hours as needed., Disp: 40 tablet, Rfl: 0 Continue all other maintenance medications as listed above.  Follow up plan: Return if symptoms worsen or fail to improve.  Educational handout given for [place] PA-C [place] [address]  [place], [place] [address] [phone]   09/02/2017, 4:39 PM

## 2017-09-02 NOTE — Patient Instructions (Signed)
In a few days you may receive a survey in the mail or online from [employer] regarding your visit with us today. Please take a moment to fill this out. Your feedback is very important to our whole office. It can help us better understand your needs as well as improve your experience and satisfaction. Thank you for taking your time to complete it. We care about you.  Merril Isakson, PA-C  

## 2017-09-03 DIAGNOSIS — Z944 Liver transplant status: Secondary | ICD-10-CM | POA: Diagnosis not present

## 2017-09-03 DIAGNOSIS — D899 Disorder involving the immune mechanism, unspecified: Secondary | ICD-10-CM | POA: Diagnosis not present

## 2017-09-04 LAB — URINE CULTURE

## 2017-09-06 DIAGNOSIS — Z944 Liver transplant status: Secondary | ICD-10-CM | POA: Diagnosis not present

## 2017-09-09 DIAGNOSIS — Z944 Liver transplant status: Secondary | ICD-10-CM | POA: Diagnosis not present

## 2017-09-12 ENCOUNTER — Ambulatory Visit (INDEPENDENT_AMBULATORY_CARE_PROVIDER_SITE_OTHER): Payer: Medicare Other | Admitting: Family

## 2017-09-12 ENCOUNTER — Ambulatory Visit (INDEPENDENT_AMBULATORY_CARE_PROVIDER_SITE_OTHER): Payer: Medicare Other

## 2017-09-12 ENCOUNTER — Encounter: Payer: Self-pay | Admitting: Family

## 2017-09-12 VITALS — BP 124/66 | HR 70 | Temp 97.2°F | Ht 64.0 in | Wt 142.6 lb

## 2017-09-12 DIAGNOSIS — M5136 Other intervertebral disc degeneration, lumbar region: Secondary | ICD-10-CM

## 2017-09-12 DIAGNOSIS — M545 Low back pain, unspecified: Secondary | ICD-10-CM

## 2017-09-12 DIAGNOSIS — Z87440 Personal history of urinary (tract) infections: Secondary | ICD-10-CM | POA: Diagnosis not present

## 2017-09-12 DIAGNOSIS — M4310 Spondylolisthesis, site unspecified: Secondary | ICD-10-CM | POA: Diagnosis not present

## 2017-09-12 LAB — MICROSCOPIC EXAMINATION: Renal Epithel, UA: NONE SEEN /hpf

## 2017-09-12 LAB — URINALYSIS, COMPLETE
BILIRUBIN UA: NEGATIVE
GLUCOSE, UA: NEGATIVE
Ketones, UA: NEGATIVE
Leukocytes, UA: NEGATIVE
Nitrite, UA: NEGATIVE
PH UA: 7 (ref 5.0–7.5)
Protein, UA: NEGATIVE
SPEC GRAV UA: 1.02 (ref 1.005–1.030)
Urobilinogen, Ur: 0.2 mg/dL (ref 0.2–1.0)

## 2017-09-12 MED ORDER — CYCLOBENZAPRINE HCL 5 MG PO TABS: 5.0000 mg | ORAL_TABLET | Freq: Three times a day (TID) | ORAL | 1 refills | Status: DC | PRN

## 2017-09-12 NOTE — Addendum Note (Signed)
Addended by: [name] A on: 09/12/2017 02:17 PM   Modules accepted: Orders

## 2017-09-12 NOTE — Progress Notes (Addendum)
   Subjective:    Patient ID: Cassidy Barber, female    DOB: 1951-05-03, 66 y.o.   MRN: [id number]  Back Pain  This is a new problem. The current episode started 1 to 4 weeks ago. The problem occurs intermittently. The problem is unchanged. The pain is present in the lumbar spine. The quality of the pain is described as aching. The pain radiates to the left thigh. The pain is at a severity of 10/10. The pain is moderate. The symptoms are aggravated by lying down. Associated symptoms include leg pain. Pertinent negatives include no bladder incontinence, bowel incontinence, dysuria, tingling or weakness. She has tried bed rest and NSAIDs for the symptoms. The treatment provided mild relief.      Review of Systems  Gastrointestinal: Negative for bowel incontinence.  Genitourinary: Negative for bladder incontinence and dysuria.  Musculoskeletal: Positive for back pain.  Neurological: Negative for tingling and weakness.  All other systems reviewed and are negative.      Objective:   Physical Exam  Constitutional: She is oriented to person, place, and time. She appears well-developed and well-nourished. No distress.  HENT:  Head: Normocephalic.  Eyes: Pupils are equal, round, and reactive to light.  Neck: Normal range of motion. Neck supple. No thyromegaly present.  Cardiovascular: Normal rate, regular rhythm, normal heart sounds and intact distal pulses.   No murmur heard. Pulmonary/Chest: Effort normal and breath sounds normal. No respiratory distress. She has no wheezes.  Abdominal: Soft. Bowel sounds are normal. She exhibits no distension. There is no tenderness.  Musculoskeletal: She exhibits tenderness. She exhibits no edema.  Lower lumbar tenderness, full ROM with extension and flexion  Neurological: She is alert and oriented to person, place, and time.  Skin: Skin is warm and dry.  Psychiatric: She has a normal mood and affect. Her behavior is normal. Judgment and thought content  normal.  Vitals reviewed.   Lumbar x-ray- L5 defect, degenerative disease present,  Preliminary reading by [name], FNP [facility]   BP 124/66   Pulse 70   Temp (!) 97.2 F (36.2 C) (Oral)   Ht 5\' 4"  (1.626 m)   Wt 142 lb 9.6 oz (64.7 kg)   BMI 24.48 kg/m      Assessment & Plan:  1. Acute bilateral low back pain without sciatica Rest Ice and heat  ROM exercises  RTO prn  - Urinalysis, Complete - Urine Culture - DG Lumbar Spine 2-3 Views; Future - cyclobenzaprine (FLEXERIL) 5 MG tablet; Take 1 tablet (5 mg total) by mouth 3 (three) times daily as needed for muscle spasms.  Dispense: 60 tablet; Refill: 1  2. Hx: UTI (urinary tract infection) - Urinalysis, Complete - Urine Culture   3. Anterolisthesis - MR Lumbar Spine Wo Contrast; Future - Ambulatory referral to Neurosurgery  4. Degenerative lumbar disc - MR Lumbar Spine Wo Contrast; Future - Ambulatory referral to Neurosurgery    [name], FNP

## 2017-09-12 NOTE — Patient Instructions (Signed)

## 2017-09-12 NOTE — Addendum Note (Signed)
Addended by: [name] A on: 09/12/2017 01:18 PM   Modules accepted: Orders

## 2017-09-15 LAB — URINE CULTURE

## 2017-09-16 ENCOUNTER — Other Ambulatory Visit: Payer: Self-pay | Admitting: Family

## 2017-09-16 MED ORDER — SULFAMETHOXAZOLE-TRIMETHOPRIM 800-160 MG PO TABS: 1.0000 | ORAL_TABLET | Freq: Two times a day (BID) | ORAL | 0 refills | Status: DC

## 2017-09-17 ENCOUNTER — Encounter: Payer: Self-pay | Admitting: Family

## 2017-09-19 ENCOUNTER — Encounter: Payer: Self-pay | Admitting: Family

## 2017-09-19 ENCOUNTER — Other Ambulatory Visit: Payer: Self-pay | Admitting: Family

## 2017-09-19 MED ORDER — ALPRAZOLAM 1 MG PO TABS: ORAL_TABLET | ORAL | 0 refills | Status: DC

## 2017-09-19 NOTE — Progress Notes (Signed)
Xanax 1 mg Prescription sent to pharmacy to take 30 mins prior to MRI

## 2017-09-20 ENCOUNTER — Other Ambulatory Visit: Payer: Self-pay | Admitting: Family

## 2017-09-20 DIAGNOSIS — I10 Essential (primary) hypertension: Secondary | ICD-10-CM

## 2017-09-26 ENCOUNTER — Other Ambulatory Visit: Payer: Self-pay | Admitting: Family

## 2017-09-27 ENCOUNTER — Telehealth: Payer: Self-pay | Admitting: Family

## 2017-09-30 ENCOUNTER — Ambulatory Visit (HOSPITAL_COMMUNITY): Payer: Medicare Other

## 2017-09-30 ENCOUNTER — Telehealth: Payer: Self-pay | Admitting: Family

## 2017-09-30 NOTE — Telephone Encounter (Signed)
Cassidy Barber was working on this for me. Have you heard anything yet?

## 2017-10-01 ENCOUNTER — Encounter: Payer: Self-pay | Admitting: Family

## 2017-10-01 ENCOUNTER — Telehealth: Payer: Self-pay

## 2017-10-01 ENCOUNTER — Other Ambulatory Visit: Payer: Self-pay | Admitting: Family

## 2017-10-01 NOTE — Telephone Encounter (Signed)
We are working on this and I am calling to try to get it approved.

## 2017-10-01 NOTE — Progress Notes (Signed)
Cassidy Barber, I called to try to get the MRI approved, but states the case is closed and I can not reopen case. I did leave a voicemail to open an appeal. If this gets denied and Neurosurgeon will not see patient, then we could send to Ortho?

## 2017-10-01 NOTE — Telephone Encounter (Signed)
Patient is aware 

## 2017-10-01 NOTE — Telephone Encounter (Signed)
Patient is aware that Alyse Low is working to get this approved.  She wants to let you know if it is approved, she will need an open MRI.

## 2017-10-05 DIAGNOSIS — Z944 Liver transplant status: Secondary | ICD-10-CM | POA: Diagnosis not present

## 2017-10-07 ENCOUNTER — Other Ambulatory Visit (INDEPENDENT_AMBULATORY_CARE_PROVIDER_SITE_OTHER): Payer: Self-pay | Admitting: *Deleted

## 2017-10-07 ENCOUNTER — Encounter (INDEPENDENT_AMBULATORY_CARE_PROVIDER_SITE_OTHER): Payer: Self-pay | Admitting: *Deleted

## 2017-10-07 DIAGNOSIS — K7290 Hepatic failure, unspecified without coma: Secondary | ICD-10-CM

## 2017-10-07 DIAGNOSIS — Z298 Encounter for other specified prophylactic measures: Secondary | ICD-10-CM | POA: Diagnosis not present

## 2017-10-07 DIAGNOSIS — K7682 Hepatic encephalopathy: Secondary | ICD-10-CM

## 2017-10-07 DIAGNOSIS — Z85038 Personal history of other malignant neoplasm of large intestine: Secondary | ICD-10-CM

## 2017-10-07 DIAGNOSIS — Z944 Liver transplant status: Secondary | ICD-10-CM | POA: Diagnosis not present

## 2017-10-09 ENCOUNTER — Telehealth: Payer: Self-pay

## 2017-10-09 NOTE — Telephone Encounter (Signed)
Pt talked with her insurance company and they told her no one had contacted them for Peer to Peer Review. I called insurance company again and was told that a new request cannot be made until 60 days from date of denial which is 09/25/2017,however you can call [phone] and ask for an appeal. They do have it on record that you called on 10/01/17, but the case had already closed.You should have been forwarded to appeals at that time.

## 2017-10-11 ENCOUNTER — Encounter: Payer: Self-pay | Admitting: Family

## 2017-10-14 ENCOUNTER — Other Ambulatory Visit: Payer: Self-pay | Admitting: Family

## 2017-10-14 DIAGNOSIS — I10 Essential (primary) hypertension: Secondary | ICD-10-CM | POA: Diagnosis not present

## 2017-10-14 DIAGNOSIS — M5416 Radiculopathy, lumbar region: Secondary | ICD-10-CM | POA: Diagnosis not present

## 2017-10-14 MED ORDER — HYDROCODONE-ACETAMINOPHEN 10-325 MG PO TABS: 1.0000 | ORAL_TABLET | ORAL | 0 refills | Status: DC | PRN

## 2017-10-16 ENCOUNTER — Other Ambulatory Visit: Payer: Self-pay | Admitting: Student

## 2017-10-16 DIAGNOSIS — M5416 Radiculopathy, lumbar region: Secondary | ICD-10-CM

## 2017-10-17 ENCOUNTER — Other Ambulatory Visit: Payer: Self-pay | Admitting: Physician Assistant

## 2017-10-17 DIAGNOSIS — L814 Other melanin hyperpigmentation: Secondary | ICD-10-CM | POA: Diagnosis not present

## 2017-10-17 DIAGNOSIS — C4442 Squamous cell carcinoma of skin of scalp and neck: Secondary | ICD-10-CM | POA: Diagnosis not present

## 2017-10-17 DIAGNOSIS — D492 Neoplasm of unspecified behavior of bone, soft tissue, and skin: Secondary | ICD-10-CM | POA: Diagnosis not present

## 2017-10-17 DIAGNOSIS — C44529 Squamous cell carcinoma of skin of other part of trunk: Secondary | ICD-10-CM | POA: Diagnosis not present

## 2017-10-17 DIAGNOSIS — D045 Carcinoma in situ of skin of trunk: Secondary | ICD-10-CM | POA: Diagnosis not present

## 2017-10-17 DIAGNOSIS — C4441 Basal cell carcinoma of skin of scalp and neck: Secondary | ICD-10-CM | POA: Diagnosis not present

## 2017-10-22 DIAGNOSIS — Z298 Encounter for other specified prophylactic measures: Secondary | ICD-10-CM | POA: Diagnosis not present

## 2017-10-22 DIAGNOSIS — Z944 Liver transplant status: Secondary | ICD-10-CM | POA: Diagnosis not present

## 2017-10-23 ENCOUNTER — Other Ambulatory Visit (INDEPENDENT_AMBULATORY_CARE_PROVIDER_SITE_OTHER): Payer: Self-pay | Admitting: *Deleted

## 2017-10-23 DIAGNOSIS — K7682 Hepatic encephalopathy: Secondary | ICD-10-CM

## 2017-10-23 DIAGNOSIS — K7290 Hepatic failure, unspecified without coma: Secondary | ICD-10-CM

## 2017-10-23 DIAGNOSIS — Z85038 Personal history of other malignant neoplasm of large intestine: Secondary | ICD-10-CM

## 2017-10-25 ENCOUNTER — Ambulatory Visit
Admission: RE | Admit: 2017-10-25 | Discharge: 2017-10-25 | Disposition: A | Discharge status: Home / Self Care | Payer: Medicare Other | Source: Ambulatory Visit | Attending: Student | Admitting: Student

## 2017-10-25 DIAGNOSIS — M5416 Radiculopathy, lumbar region: Secondary | ICD-10-CM

## 2017-10-25 DIAGNOSIS — M48061 Spinal stenosis, lumbar region without neurogenic claudication: Secondary | ICD-10-CM | POA: Diagnosis not present

## 2017-10-30 DIAGNOSIS — I10 Essential (primary) hypertension: Secondary | ICD-10-CM | POA: Diagnosis not present

## 2017-10-30 DIAGNOSIS — M4316 Spondylolisthesis, lumbar region: Secondary | ICD-10-CM | POA: Diagnosis not present

## 2017-11-05 ENCOUNTER — Encounter (INDEPENDENT_AMBULATORY_CARE_PROVIDER_SITE_OTHER): Payer: Self-pay | Admitting: Internal Medicine

## 2017-11-06 DIAGNOSIS — Z944 Liver transplant status: Secondary | ICD-10-CM | POA: Diagnosis not present

## 2017-11-06 DIAGNOSIS — K831 Obstruction of bile duct: Secondary | ICD-10-CM | POA: Diagnosis not present

## 2017-11-06 DIAGNOSIS — Z4659 Encounter for fitting and adjustment of other gastrointestinal appliance and device: Secondary | ICD-10-CM | POA: Diagnosis not present

## 2017-11-06 DIAGNOSIS — Z4589 Encounter for adjustment and management of other implanted devices: Secondary | ICD-10-CM | POA: Diagnosis not present

## 2017-11-08 DIAGNOSIS — Z944 Liver transplant status: Secondary | ICD-10-CM | POA: Diagnosis not present

## 2017-11-11 ENCOUNTER — Encounter (INDEPENDENT_AMBULATORY_CARE_PROVIDER_SITE_OTHER): Payer: Self-pay | Admitting: Internal Medicine

## 2017-11-13 DIAGNOSIS — K7290 Hepatic failure, unspecified without coma: Secondary | ICD-10-CM | POA: Diagnosis not present

## 2017-11-13 DIAGNOSIS — Z298 Encounter for other specified prophylactic measures: Secondary | ICD-10-CM | POA: Diagnosis not present

## 2017-11-13 DIAGNOSIS — Z944 Liver transplant status: Secondary | ICD-10-CM | POA: Diagnosis not present

## 2017-11-13 DIAGNOSIS — Z85038 Personal history of other malignant neoplasm of large intestine: Secondary | ICD-10-CM | POA: Diagnosis not present

## 2017-11-14 LAB — CEA: CEA: 3.4 ng/mL (ref 0.0–4.7)

## 2017-11-21 ENCOUNTER — Ambulatory Visit: Payer: Medicare Other | Admitting: Family

## 2017-11-21 ENCOUNTER — Encounter: Payer: Self-pay | Admitting: Family

## 2017-11-21 VITALS — BP 120/79 | HR 75 | Temp 96.9°F | Ht 64.0 in | Wt 141.0 lb

## 2017-11-21 DIAGNOSIS — N3001 Acute cystitis with hematuria: Secondary | ICD-10-CM | POA: Diagnosis not present

## 2017-11-21 DIAGNOSIS — R300 Dysuria: Secondary | ICD-10-CM

## 2017-11-21 DIAGNOSIS — F411 Generalized anxiety disorder: Secondary | ICD-10-CM | POA: Diagnosis not present

## 2017-11-21 DIAGNOSIS — M47816 Spondylosis without myelopathy or radiculopathy, lumbar region: Secondary | ICD-10-CM | POA: Diagnosis not present

## 2017-11-21 DIAGNOSIS — M4316 Spondylolisthesis, lumbar region: Secondary | ICD-10-CM | POA: Diagnosis not present

## 2017-11-21 LAB — URINALYSIS, COMPLETE
Bilirubin, UA: NEGATIVE
Glucose, UA: NEGATIVE
NITRITE UA: POSITIVE — AB
Protein, UA: NEGATIVE
SPEC GRAV UA: 1.025 (ref 1.005–1.030)
UUROB: 0.2 mg/dL (ref 0.2–1.0)
pH, UA: 5 (ref 5.0–7.5)

## 2017-11-21 LAB — MICROSCOPIC EXAMINATION: WBC, UA: >30 /hpf — AB (ref 0–?)

## 2017-11-21 MED ORDER — CEFTRIAXONE SODIUM 1 G IJ SOLR
1.0000 g | Freq: Once | INTRAMUSCULAR | Status: AC
  Administered 2017-11-21: 1 g via INTRAMUSCULAR

## 2017-11-21 MED ORDER — ALPRAZOLAM 0.5 MG PO TABS: 0.5000 mg | ORAL_TABLET | Freq: Every evening | ORAL | 5 refills | Status: DC | PRN

## 2017-11-21 MED ORDER — SULFAMETHOXAZOLE-TRIMETHOPRIM 800-160 MG PO TABS: 1.0000 | ORAL_TABLET | Freq: Two times a day (BID) | ORAL | 0 refills | Status: DC

## 2017-11-21 NOTE — Progress Notes (Addendum)
   Subjective:    Patient ID: Cassidy Barber, female    DOB: 12/27/1950, 67 y.o.   MRN: [id number]  Pt presents to the office today with urinary frequency. She is also requesting a refill on her xanax. Anxiety stable at this time.  Dysuria   This is a new problem. The current episode started 1 to 4 weeks ago. The problem has been gradually worsening. The quality of the pain is described as burning. The pain is mild. Associated symptoms include frequency (every hour) and urgency. Pertinent negatives include no hematuria, hesitancy, nausea or vomiting. She has tried increased fluids for the symptoms. The treatment provided mild relief.      Review of Systems  Gastrointestinal: Negative for nausea and vomiting.  Genitourinary: Positive for dysuria, frequency (every hour) and urgency. Negative for hematuria and hesitancy.  All other systems reviewed and are negative.      Objective:   Physical Exam  Constitutional: She is oriented to person, place, and time. She appears well-developed and well-nourished. No distress.  HENT:  Head: Normocephalic.  Eyes: Pupils are equal, round, and reactive to light.  Neck: Normal range of motion. Neck supple. No thyromegaly present.  Cardiovascular: Normal rate, regular rhythm, normal heart sounds and intact distal pulses.  No murmur heard. Pulmonary/Chest: Effort normal and breath sounds normal. No respiratory distress. She has no wheezes.  Abdominal: Soft. Bowel sounds are normal. She exhibits no distension. There is no tenderness.  Musculoskeletal: Normal range of motion. She exhibits no edema or tenderness.  Neurological: She is alert and oriented to person, place, and time.  Skin: Skin is warm and dry.  Psychiatric: She has a normal mood and affect. Her behavior is normal. Judgment and thought content normal.  Vitals reviewed.    BP 120/79   Pulse 75   Temp (!) 96.9 F (36.1 C) (Oral)   Ht 5\' 4"  (1.626 m)   Wt 141 lb (64 kg)   BMI 24.20  kg/m      Assessment & Plan:  1. Dysuria - Urinalysis, Complete  2. Acute cystitis with hematuria Force fluids AZO over the counter X2 days RTO prn Culture pending - Urine Culture - sulfamethoxazole-trimethoprim (BACTRIM DS) 800-160 MG tablet; Take 1 tablet by mouth 2 (two) times daily.  Dispense: 14 tablet; Refill: 0 -- cefTRIAXone (ROCEPHIN) injection 1 g  3. GAD (generalized anxiety disorder) - ALPRAZolam (XANAX) 0.5 MG tablet; Take 1 tablet (0.5 mg total) by mouth at bedtime as needed.  Dispense: 30 tablet; Refill: [place], FNP

## 2017-11-21 NOTE — Addendum Note (Signed)
Addended by: [name] A on: 11/21/2017 08:36 AM   Modules accepted: Orders

## 2017-11-21 NOTE — Patient Instructions (Signed)

## 2017-11-23 LAB — URINE CULTURE

## 2017-11-25 ENCOUNTER — Other Ambulatory Visit: Payer: Self-pay | Admitting: Family

## 2017-11-25 MED ORDER — AMOXICILLIN-POT CLAVULANATE 875-125 MG PO TABS
1.0000 | ORAL_TABLET | Freq: Two times a day (BID) | ORAL | 0 refills | Status: DC
Start: 2017-11-25 — End: 2017-12-10

## 2017-12-03 ENCOUNTER — Other Ambulatory Visit: Payer: Self-pay | Admitting: *Deleted

## 2017-12-03 DIAGNOSIS — Z944 Liver transplant status: Secondary | ICD-10-CM

## 2017-12-03 DIAGNOSIS — Z298 Encounter for other specified prophylactic measures: Secondary | ICD-10-CM

## 2017-12-04 DIAGNOSIS — M47816 Spondylosis without myelopathy or radiculopathy, lumbar region: Secondary | ICD-10-CM | POA: Diagnosis not present

## 2017-12-04 DIAGNOSIS — I10 Essential (primary) hypertension: Secondary | ICD-10-CM | POA: Diagnosis not present

## 2017-12-05 DIAGNOSIS — Z944 Liver transplant status: Secondary | ICD-10-CM | POA: Diagnosis not present

## 2017-12-05 DIAGNOSIS — C4442 Squamous cell carcinoma of skin of scalp and neck: Secondary | ICD-10-CM | POA: Diagnosis not present

## 2017-12-06 ENCOUNTER — Other Ambulatory Visit: Payer: Medicare Other

## 2017-12-06 DIAGNOSIS — Z944 Liver transplant status: Secondary | ICD-10-CM

## 2017-12-06 DIAGNOSIS — Z298 Encounter for other specified prophylactic measures: Secondary | ICD-10-CM | POA: Diagnosis not present

## 2017-12-10 ENCOUNTER — Ambulatory Visit (INDEPENDENT_AMBULATORY_CARE_PROVIDER_SITE_OTHER): Payer: Medicare Other

## 2017-12-10 ENCOUNTER — Ambulatory Visit: Payer: Medicare Other | Admitting: Family

## 2017-12-10 ENCOUNTER — Encounter: Payer: Self-pay | Admitting: Family

## 2017-12-10 ENCOUNTER — Other Ambulatory Visit: Payer: Self-pay | Admitting: Family

## 2017-12-10 VITALS — BP 136/78 | HR 89 | Temp 96.9°F | Ht 64.0 in | Wt 145.0 lb

## 2017-12-10 DIAGNOSIS — Z780 Asymptomatic menopausal state: Secondary | ICD-10-CM | POA: Diagnosis not present

## 2017-12-10 DIAGNOSIS — R635 Abnormal weight gain: Secondary | ICD-10-CM | POA: Diagnosis not present

## 2017-12-10 DIAGNOSIS — R5383 Other fatigue: Secondary | ICD-10-CM

## 2017-12-10 DIAGNOSIS — J020 Streptococcal pharyngitis: Secondary | ICD-10-CM | POA: Diagnosis not present

## 2017-12-10 DIAGNOSIS — R399 Unspecified symptoms and signs involving the genitourinary system: Secondary | ICD-10-CM | POA: Diagnosis not present

## 2017-12-10 DIAGNOSIS — R109 Unspecified abdominal pain: Secondary | ICD-10-CM

## 2017-12-10 DIAGNOSIS — J029 Acute pharyngitis, unspecified: Secondary | ICD-10-CM

## 2017-12-10 DIAGNOSIS — M810 Age-related osteoporosis without current pathological fracture: Secondary | ICD-10-CM | POA: Diagnosis not present

## 2017-12-10 LAB — URINALYSIS, COMPLETE
BILIRUBIN UA: NEGATIVE
GLUCOSE, UA: NEGATIVE
Leukocytes, UA: NEGATIVE
NITRITE UA: NEGATIVE
Protein, UA: NEGATIVE
SPEC GRAV UA: 1.02 (ref 1.005–1.030)
UUROB: 0.2 mg/dL (ref 0.2–1.0)
pH, UA: 5 (ref 5.0–7.5)

## 2017-12-10 LAB — MICROSCOPIC EXAMINATION
Epithelial Cells (non renal): >10 /hpf — AB (ref 0–10)
Renal Epithel, UA: NONE SEEN /hpf

## 2017-12-10 LAB — RAPID STREP SCREEN (MED CTR MEBANE ONLY): Strep Gp A Ag, IA W/Reflex: POSITIVE — AB

## 2017-12-10 MED ORDER — AMOXICILLIN-POT CLAVULANATE 875-125 MG PO TABS: 1.0000 | ORAL_TABLET | Freq: Two times a day (BID) | ORAL | 0 refills | Status: DC

## 2017-12-10 NOTE — Progress Notes (Signed)
   Subjective:    Patient ID: Cassidy Barber, female    DOB: 22-Mar-1951, 67 y.o.   MRN: [id number]  PT presents to the office today with sore throat and recurrent urinary frequency. Pt states she is complaining of weight gain and fatigue. Pt states she has been on the Keto diet for the last three weeks and exercising.  Sore Throat   This is a new problem. The current episode started yesterday. The problem has been gradually worsening. There has been no fever. The pain is at a severity of 3/10. The pain is mild. Associated symptoms include congestion, ear pain (left ), headaches and a hoarse voice. Pertinent negatives include no coughing. She has tried acetaminophen for the symptoms.  Urinary Frequency   This is a recurrent problem. The current episode started in the past 7 days. The problem occurs intermittently. The problem has been waxing and waning. Associated symptoms include frequency, hesitancy and urgency. She has tried increased fluids for the symptoms.      Review of Systems  HENT: Positive for congestion, ear pain (left ) and hoarse voice.   Respiratory: Negative for cough.   Genitourinary: Positive for frequency, hesitancy and urgency.  Neurological: Positive for headaches.       Objective:   Physical Exam  Constitutional: She is oriented to person, place, and time. She appears well-developed and well-nourished. No distress.  HENT:  Head: Normocephalic and atraumatic.  Right Ear: External ear normal.  Left Ear: There is tenderness.  Nose: Rhinorrhea present.  Mouth/Throat: Posterior oropharyngeal erythema present.  Eyes: Pupils are equal, round, and reactive to light.  Neck: Normal range of motion. Neck supple. No thyromegaly present.  Cardiovascular: Normal rate, regular rhythm, normal heart sounds and intact distal pulses.  No murmur heard. Pulmonary/Chest: Effort normal and breath sounds normal. No respiratory distress. She has no wheezes.  Abdominal: Soft. Bowel sounds  are normal. She exhibits no distension. There is no tenderness.  Musculoskeletal: Normal range of motion. She exhibits no edema or tenderness.  Neurological: She is alert and oriented to person, place, and time. She has normal reflexes. No cranial nerve deficit.  Skin: Skin is warm and dry.  Psychiatric: She has a normal mood and affect. Her behavior is normal. Judgment and thought content normal.  Vitals reviewed.     BP 136/78   Pulse 89   Temp (!) 96.9 F (36.1 C) (Oral)   Ht _0  (1.626 m)   Wt 145 lb (65.8 kg)   BMI 24.89 kg/m      Assessment & Plan:  1. Sore throat - Rapid Strep Screen (Not at [facility]) - BMP8+EGFR  2. Abdominal pressure - Urinalysis, Complete - Urine Culture - BMP8+EGFR  3. Fatigue, unspecified type - TSH - BMP8+EGFR  4. Strep throat - Take meds as prescribed - Use a cool mist humidifier  -Use saline nose sprays frequently -Force fluids -For any cough or congestion  Use plain Mucinex- regular strength or max strength is fine -For fever or aces or pains- take tylenol or ibuprofen appropriate for age and weight. -Throat lozenges if help -New toothbrush in 3 days - amoxicillin-clavulanate (AUGMENTIN) 875-125 MG tablet; Take 1 tablet by mouth 2 (two) times daily.  Dispense: 14 tablet; Refill: 0  5. UTI symptoms - amoxicillin-clavulanate (AUGMENTIN) 875-125 MG tablet; Take 1 tablet by mouth 2 (two) times daily.  Dispense: 14 tablet; Refill: 0  6. Weight gain - TSH - BMP8+EGFR    [name], FNP

## 2017-12-10 NOTE — Patient Instructions (Signed)
Strep Throat Strep throat is a bacterial infection of the throat. Your health care provider may call the infection tonsillitis or pharyngitis, depending on whether there is swelling in the tonsils or at the back of the throat. Strep throat is most common during the cold months of the year in children who are 5-67 years of age, but it can happen during any season in people of any age. This infection is spread from person to person (contagious) through coughing, sneezing, or close contact. What are the causes? Strep throat is caused by the bacteria called Streptococcus pyogenes. What increases the risk? This condition is more likely to develop in:  People who spend time in crowded places where the infection can spread easily.  People who have close contact with someone who has strep throat.  What are the signs or symptoms? Symptoms of this condition include:  Fever or chills.  Redness, swelling, or pain in the tonsils or throat.  Pain or difficulty when swallowing.  White or yellow spots on the tonsils or throat.  Swollen, tender glands in the neck or under the jaw.  Red rash all over the body (rare).  How is this diagnosed? This condition is diagnosed by performing a rapid strep test or by taking a swab of your throat (throat culture test). Results from a rapid strep test are usually ready in a few minutes, but throat culture test results are available after one or two days. How is this treated? This condition is treated with antibiotic medicine. Follow these instructions at home: Medicines  Take over-the-counter and prescription medicines only as told by your health care provider.  Take your antibiotic as told by your health care provider. Do not stop taking the antibiotic even if you start to feel better.  Have family members who also have a sore throat or fever tested for strep throat. They may need antibiotics if they have the strep infection. Eating and drinking  Do not  share food, drinking cups, or personal items that could cause the infection to spread to other people.  If swallowing is difficult, try eating soft foods until your sore throat feels better.  Drink enough fluid to keep your urine clear or pale yellow. General instructions  Gargle with a salt-water mixture 3-4 times per day or as needed. To make a salt-water mixture, completely dissolve -1 tsp of salt in 1 cup of warm water.  Make sure that all household members wash their hands well.  Get plenty of rest.  Stay home from school or work until you have been taking antibiotics for 24 hours.  Keep all follow-up visits as told by your health care provider. This is important. Contact a health care provider if:  The glands in your neck continue to get bigger.  You develop a rash, cough, or earache.  You cough up a thick liquid that is green, yellow-brown, or bloody.  You have pain or discomfort that does not get better with medicine.  Your problems seem to be getting worse rather than better.  You have a fever. Get help right away if:  You have new symptoms, such as vomiting, severe headache, stiff or painful neck, chest pain, or shortness of breath.  You have severe throat pain, drooling, or changes in your voice.  You have swelling of the neck, or the skin on the neck becomes red and tender.  You have signs of dehydration, such as fatigue, dry mouth, and decreased urination.  You become increasingly sleepy, or   you cannot wake up completely.  Your joints become red or painful. This information is not intended to replace advice given to you by your health care provider. Make sure you discuss any questions you have with your health care provider. Document Released: 10/26/2000 Document Revised: 06/27/2016 Document Reviewed: 02/21/2015 Elsevier Interactive Patient Education  2018 Elsevier Inc.  

## 2017-12-11 LAB — BMP8+EGFR
BUN/Creatinine Ratio: 20 (ref 12–28)
BUN: 19 mg/dL (ref 8–27)
CALCIUM: 8.6 mg/dL — AB (ref 8.7–10.3)
CHLORIDE: 104 mmol/L (ref 96–106)
CO2: 20 mmol/L (ref 20–29)
Creatinine, Ser: 0.93 mg/dL (ref 0.57–1.00)
GFR calc non Af Amer: 64 mL/min/{1.73_m2} (ref >59)
GFR, EST AFRICAN AMERICAN: 74 mL/min/{1.73_m2} (ref >59)
Glucose: 155 mg/dL — ABNORMAL HIGH (ref 65–99)
Potassium: 4.5 mmol/L (ref 3.5–5.2)
Sodium: 140 mmol/L (ref 134–144)

## 2017-12-11 LAB — URINE CULTURE

## 2017-12-11 LAB — TSH: TSH: 1.78 u[IU]/mL (ref 0.450–4.500)

## 2017-12-12 ENCOUNTER — Other Ambulatory Visit: Payer: Self-pay | Admitting: Family

## 2017-12-12 ENCOUNTER — Ambulatory Visit (INDEPENDENT_AMBULATORY_CARE_PROVIDER_SITE_OTHER): Payer: Medicare Other | Admitting: *Deleted

## 2017-12-12 ENCOUNTER — Encounter: Payer: Self-pay | Admitting: *Deleted

## 2017-12-12 VITALS — BP 127/69 | HR 78 | Ht 63.0 in | Wt 147.0 lb

## 2017-12-12 DIAGNOSIS — Z0000 Encounter for general adult medical examination without abnormal findings: Secondary | ICD-10-CM

## 2017-12-12 MED ORDER — ALENDRONATE SODIUM 70 MG PO TABS: 70.0000 mg | ORAL_TABLET | ORAL | 2 refills | Status: DC

## 2017-12-12 NOTE — Patient Instructions (Signed)
  [name] , Thank you for taking time to come for your Medicare Wellness Visit. I appreciate your ongoing commitment to your health goals. Please review the following plan we discussed and let me know if I can assist you in the future.   These are the goals we discussed: Goals    . Exercise 150 min/wk Moderate Activity       This is a list of the screening recommended for you and due dates:  Health Maintenance  Topic Date Due  . Mammogram  12/31/2018  . Colon Cancer Screening  08/12/2024  . Tetanus Vaccine  08/28/2026  . Flu Shot  Completed  . DEXA scan (bone density measurement)  Completed  .  Hepatitis C: One time screening is recommended by Center for Disease Control  (CDC) for  adults born from [birth year] through 1965.   Completed  . Pneumonia vaccines  Completed

## 2017-12-12 NOTE — Progress Notes (Signed)
Subjective:   Cassidy Barber is a 67 y.o. female who presents for an Initial Medicare Annual Wellness Visit.  Review of Systems    Reports that her health is better than last year. Successful liver transplant 4 years ago. She does report some decreased energy levels recently. Saw PCP and labwork to evaluate.   Cardiac Risk Factors include: advanced age (>45men, >105 women)  Musculoskeletal: Low back pain. Injections 2 weeks ago that are not helping    Other systems negative today.  Objective:    Today's Vitals   12/12/17 1005 12/12/17 1007  BP: 127/69   Pulse: 78   Weight: 147 lb (66.7 kg)   Height: 5\' 3"  (1.6 m)   PainSc:  7    Body mass index is 26.04 kg/m.  Advanced Directives 12/12/2017 08/09/2017 07/11/2016 10/20/2015 10/04/2014 09/24/2014 11/02/2013  Does Patient Have a Medical Advance Directive? No No No No No No Patient does not have advance directive;Patient would not like information  Would patient like information on creating a medical advance directive? No - Patient declined - No - patient declined information No - patient declined information No - patient declined information No - patient declined information -  Pre-existing out of facility DNR order (yellow form or pink MOST form) - - - - - - No    Current Medications (verified) Outpatient Encounter Medications as of 12/12/2017  Medication Sig  . acetaminophen (TYLENOL) 325 MG tablet Take 650 mg by mouth every 6 (six) hours as needed for mild pain or moderate pain.   [name] alendronate (FOSAMAX) 70 MG tablet Take 1 tablet (70 mg total) by mouth every 7 (seven) days. Take with a full glass of water on an empty stomach.  . ALPRAZolam (XANAX) 0.5 MG tablet Take 1 tablet (0.5 mg total) by mouth at bedtime as needed.  [name] amoxicillin-clavulanate (AUGMENTIN) 875-125 MG tablet Take 1 tablet by mouth 2 (two) times daily.  [name] aspirin EC 81 MG tablet Take 81 mg by mouth daily.  . Cholecalciferol (VITAMIN D3) 2000 UNITS capsule Take  1 capsule by mouth daily.  . cyclobenzaprine (FLEXERIL) 5 MG tablet Take 1 tablet (5 mg total) by mouth 3 (three) times daily as needed for muscle spasms.  [name] dicyclomine (BENTYL) 20 MG tablet TAKE ONE TABLET BY MOUTH THREE TIMES A DAY BEFORE MEALS  . diphenoxylate-atropine (LOMOTIL) 2.5-0.025 MG tablet TAKE 1 TABLET BY MOUTH FOUR TIMES DAILY  . EVEROLIMUS PO 6 mg. Takes 6 Po in the morning , 6 po at night.  . ferrous sulfate 325 (65 FE) MG tablet Take 325 mg by mouth 2 (two) times daily with a meal.  . fluticasone (FLONASE) 50 MCG/ACT nasal spray Place 2 sprays into both nostrils daily.  . hydrOXYzine (VISTARIL) 25 MG capsule Take 25 mg by mouth 3 (three) times daily as needed.   [name] levothyroxine (SYNTHROID, LEVOTHROID) 150 MCG tablet TAKE 1 TABLET BY MOUTH ONCE DAILY  . loperamide (IMODIUM) 2 MG capsule TAKE 2 CAPSULES 4 TIMES DAILY AS NEEDED  . losartan (COZAAR) 25 MG tablet TAKE ONE (1) TABLET EACH DAY  . magnesium oxide (MAG-OX) 400 MG tablet Take 400 mg by mouth daily.  . mupirocin ointment (BACTROBAN) 2 % Apply 1 application topically as needed. Reported on 11/30/2015  . pantoprazole (PROTONIX) 40 MG tablet Take 1 tablet (40 mg total) by mouth daily before breakfast.  . PREVALITE 4 g packet Take 4 g by mouth daily as needed.   . tacrolimus (PROGRAF)  1 MG capsule Take 1-2 mg by mouth. Patient is taking 2 in the morning and 1 at night.  . ursodiol (ACTIGALL) 250 MG tablet Take 250 mg by mouth 2 (two) times daily.  . Vitamin D, Ergocalciferol, (DRISDOL) 50000 units CAPS capsule Take 1 capsule (50,000 Units total) by mouth every 7 (seven) days. (Patient taking differently: Take 50,000 Units by mouth every 7 (seven) days. thursday)  . [DISCONTINUED] metoprolol succinate (TOPROL-XL) 25 MG 24 hr tablet TAKE ONE (1) TABLET EACH DAY   No facility-administered encounter medications on file as of 12/12/2017.     Allergies (verified) Ciprofloxacin; Codeine; and Oxycodone   History: Past Medical  History:  Diagnosis Date  . Anemia of chronic disease 10/26/2013  . Hernia of unspecified site of abdominal cavity without mention of obstruction or gangrene   . Hypertension   . Liver disease   . Lynch syndrome   . Lynch syndrome   . Primary biliary cirrhosis ([place]) 10/26/2013  . Thyroid disease    Past Surgical History:  Procedure Laterality Date  . ABDOMINAL HERNIA REPAIR     Patient's states that she has had 8- 9 hernia surgeries  . ABDOMINAL HYSTERECTOMY    . CHOLECYSTECTOMY  2007  . COLON RESECTION    . COLON SURGERY  2008   Done at [facility]  . COLONOSCOPY     Done at [facility]  . FLEXIBLE SIGMOIDOSCOPY N/A 10/20/2015   Procedure: FLEXIBLE SIGMOIDOSCOPY;  Surgeon: Rogene Houston, MD;  [facility];  Service: Endoscopy;  Laterality: N/A;  46 - Dr Laural Golden has meeting until 1:00  . FLEXIBLE SIGMOIDOSCOPY N/A 07/11/2016   Procedure: FLEXIBLE SIGMOIDOSCOPY;  Surgeon: Rogene Houston, MD;  Location: [facility];  Service: Endoscopy;  Laterality: N/A;  1200  . FLEXIBLE SIGMOIDOSCOPY N/A 08/09/2017   Procedure: FLEXIBLE SIGMOIDOSCOPY;  Surgeon: Rogene Houston, MD;  Location: [facility];  Service: Endoscopy;  Laterality: N/A;  1:00  . HERNIA REPAIR    . LIVER TRANSPLANT  [id number]  . POLYPECTOMY  08/09/2017   Procedure: POLYPECTOMY;  Surgeon: Rogene Houston, MD;  Location: [facility];  Service: Endoscopy;;  colon small bowel  . SPLENECTOMY  2006  . UPPER GASTROINTESTINAL ENDOSCOPY     Done at [facility]   Family History  Problem Relation Age of Onset  . Prostate cancer Father   . Colon cancer Father   . Colon cancer Sister   . Lung cancer Sister   . Healthy Son   . Alcohol abuse Brother    Social History   Socioeconomic History  . Marital status: Married    Spouse name: Not on file  . Number of children: 1  . Years of education: Not on file  . Highest education level: 12th grade  Social Needs  . Financial resource strain: Not hard at all  . Food insecurity -  worry: Never true  . Food insecurity - inability: Never true  . Transportation needs - medical: No  . Transportation needs - non-medical: No  Occupational History  . Occupation: Disability    Employer: [employer]    Comment: Multimedia programmer  Tobacco Use  . Smoking status: Never Smoker  . Smokeless tobacco: Never Used  Substance and Sexual Activity  . Alcohol use: No    Alcohol/week: 0.0 oz  . Drug use: No  . Sexual activity: Yes    Birth control/protection: None  Other Topics Concern  . Not on file  Social History Narrative  Patient lives in a two story home with her husband. She has an adult son. She is retired from being an Web designer for 30 years.     Tobacco Counseling No tobacco use  Clinical Intake:     Pain : 0-10 Pain Score: 7  Pain Type: Chronic pain Pain Descriptors / Indicators: Aching Pain Onset: More than a month ago Pain Frequency: Intermittent Pain Relieving Factors: Aleve and spinal injections Effect of Pain on Daily Activities: Mild  Pain Relieving Factors: Aleve and spinal injections  Nutritional Status: BMI 25 -29 Overweight Nutritional Risks: Unintentional weight gain Diabetes: No  How often do you need to have someone help you when you read instructions, pamphlets, or other written materials from your doctor or pharmacy?: 1 - Never What is the last grade level you completed in school?: 12th     Information entered by :: Chong Sicilian, RN   Activities of Daily Living In your present state of health, do you have any difficulty performing the following activities: 12/12/2017  Hearing? N  Vision? N  Comment [facility]. Has cataracts and has not scheduled surgery. Due for eye exam.  Difficulty concentrating or making decisions? N  Walking or climbing stairs? N  Dressing or bathing? N  Doing errands, shopping? N  Preparing Food and eating ? N  Using the Toilet? N  In the past six months, have you accidently  leaked urine? N  Do you have problems with loss of bowel control? N  Managing your Medications? N  Comment Keeps them organized in a pill box  Managing your Finances? N  Housekeeping or managing your Housekeeping? N  Some recent data might be hidden     Immunizations and Health Maintenance Immunization History  Administered Date(s) Administered  . Influenza Split 08/06/2011, 09/18/2012, 09/13/2015  . Influenza,inj,Quad PF,6+ Mos 08/07/2010, 07/31/2013, 08/16/2014, 09/01/2015  . Influenza-Unspecified 07/13/2013, 09/01/2015, 08/27/2016, 07/26/2017  . Pneumococcal Conjugate-13 09/01/2015, 07/26/2017  . Pneumococcal Polysaccharide-23 08/06/2011, 07/13/2017  . Pneumococcal-Unspecified 07/13/2012  . Tdap 08/28/2016   There are no preventive care reminders to display for this patient.  Patient Care Team: [name], FNP as PCP - General (Family Medicine)  Sherley Bounds, MD- neurosurgery  No hospitalizations, ER visits, or surgeries this past year     Assessment:   This is a routine wellness examination for Cassidy Barber.  Hearing/Vision screen No deficits noted during visit  Dietary issues and exercise activities discussed: Current Exercise Habits: Structured exercise class, Type of exercise: walking;strength training/weights;calisthenics, Time (Minutes): 60, Frequency (Times/Week): 3, Weekly Exercise (Minutes/Week): 180, Intensity: Moderate  Goals    . Exercise 150 min/wk Moderate Activity      Depression Screen PHQ 2/9 Scores 12/12/2017 12/10/2017 11/21/2017 09/12/2017 09/02/2017 04/04/2017 02/26/2017  PHQ - 2 Score 1 1 0 0 0 0 0    Fall Risk Fall Risk  12/12/2017 12/10/2017 11/21/2017 09/12/2017 09/02/2017  Falls in the past year? No No No No No    Is the patient's home free of loose throw rugs in walkways, pet beds, electrical cords, etc?   yes      Grab bars in the bathroom? no      Handrails on the stairs?   yes      Adequate lighting?   yes   Cognitive Function: MMSE -  Mini Mental State Exam 12/12/2017  Orientation to time 5  Orientation to Place 5  Registration 3  Attention/ Calculation 5  Recall 3  Language- name 2 objects 2  Language- repeat 1  Language- follow 3 step command 3  Language- read & follow direction 1  Write a sentence 1  Copy design 0  Total score 29    Normal exam    Screening Tests Health Maintenance  Topic Date Due  . MAMMOGRAM  12/31/2018  . COLONOSCOPY  08/12/2024  . TETANUS/TDAP  08/28/2026  . INFLUENZA VACCINE  Completed  . DEXA SCAN  Completed  . Hepatitis C Screening  Completed  . PNA vac Low Risk Adult  Completed     Plan:   Keep f/u with PCP Continue to stay active. Aim for at least 150 minutes of moderate activity a week  I have personally reviewed and noted the following in the patient's chart:   . Medical and social history . Use of alcohol, tobacco or illicit drugs  . Current medications and supplements . Functional ability and status . Nutritional status . Physical activity . Advanced directives . List of other physicians . Hospitalizations, surgeries, and ER visits in previous 12 months . Vitals . Screenings to include cognitive, depression, and falls . Referrals and appointments  In addition, I have reviewed and discussed with patient certain preventive protocols, quality metrics, and best practice recommendations. A written personalized care plan for preventive services as well as general preventive health recommendations were provided to patient.     Chong Sicilian, RN  12/13/2017    I have reviewed and agree with the above AWV documentation.   [name], FNP

## 2017-12-13 ENCOUNTER — Other Ambulatory Visit: Payer: Self-pay | Admitting: Family

## 2017-12-13 DIAGNOSIS — I10 Essential (primary) hypertension: Secondary | ICD-10-CM

## 2017-12-16 ENCOUNTER — Encounter: Payer: Self-pay | Admitting: Family

## 2017-12-20 LAB — CMP14+EGFR
A/G RATIO: 1.3 (ref 1.2–2.2)
ALBUMIN: 3.3 g/dL — AB (ref 3.6–4.8)
ALT: 19 IU/L (ref 0–32)
AST: 24 IU/L (ref 0–40)
Alkaline Phosphatase: 100 IU/L (ref 39–117)
BUN / CREAT RATIO: 17 (ref 12–28)
BUN: 15 mg/dL (ref 8–27)
CALCIUM: 8.3 mg/dL — AB (ref 8.7–10.3)
CHLORIDE: 105 mmol/L (ref 96–106)
CO2: 26 mmol/L (ref 20–29)
Creatinine, Ser: 0.86 mg/dL (ref 0.57–1.00)
GFR calc non Af Amer: 71 mL/min/{1.73_m2} (ref >59)
GFR, EST AFRICAN AMERICAN: 81 mL/min/{1.73_m2} (ref >59)
GLOBULIN, TOTAL: 2.5 g/dL (ref 1.5–4.5)
Glucose: 99 mg/dL (ref 65–99)
POTASSIUM: 4.8 mmol/L (ref 3.5–5.2)
Sodium: 141 mmol/L (ref 134–144)
TOTAL PROTEIN: 5.8 g/dL — AB (ref 6.0–8.5)

## 2017-12-20 LAB — TACROLIMUS LEVEL: Tacrolimus Lvl: 2.7 ng/mL (ref 2.0–20.0)

## 2017-12-20 LAB — CBC WITH DIFFERENTIAL/PLATELET
BASOS ABS: 0.2 10*3/uL (ref 0.0–0.2)
Basos: 3 %
EOS (ABSOLUTE): 0.3 10*3/uL (ref 0.0–0.4)
Eos: 6 %
HEMOGLOBIN: 10.8 g/dL — AB (ref 11.1–15.9)
Hematocrit: 34.1 % (ref 34.0–46.6)
IMMATURE GRANS (ABS): 0 10*3/uL (ref 0.0–0.1)
Immature Granulocytes: 0 %
LYMPHS ABS: 2.2 10*3/uL (ref 0.7–3.1)
LYMPHS: 45 %
MCH: 27.3 pg (ref 26.6–33.0)
MCHC: 31.7 g/dL (ref 31.5–35.7)
MCV: 86 fL (ref 79–97)
Monocytes Absolute: 0.7 10*3/uL (ref 0.1–0.9)
Monocytes: 15 %
NEUTROS ABS: 1.5 10*3/uL (ref 1.4–7.0)
Neutrophils: 31 %
Platelets: 322 10*3/uL (ref 150–379)
RBC: 3.95 x10E6/uL (ref 3.77–5.28)
RDW: 16.9 % — ABNORMAL HIGH (ref 12.3–15.4)
WBC: 4.8 10*3/uL (ref 3.4–10.8)

## 2017-12-20 LAB — MAGNESIUM: Magnesium: 2 mg/dL (ref 1.6–2.3)

## 2017-12-20 LAB — EVEROLIMUS: Everolimus: 4.9 ng/mL (ref 3.0–8.0)

## 2017-12-25 ENCOUNTER — Encounter: Payer: Self-pay | Admitting: Physician Assistant

## 2017-12-25 ENCOUNTER — Ambulatory Visit: Payer: Medicare Other | Admitting: Physician Assistant

## 2017-12-25 VITALS — BP 118/70 | HR 79 | Temp 98.2°F | Ht 63.0 in | Wt 144.4 lb

## 2017-12-25 DIAGNOSIS — J029 Acute pharyngitis, unspecified: Secondary | ICD-10-CM | POA: Diagnosis not present

## 2017-12-25 DIAGNOSIS — J069 Acute upper respiratory infection, unspecified: Secondary | ICD-10-CM

## 2017-12-25 DIAGNOSIS — J020 Streptococcal pharyngitis: Secondary | ICD-10-CM

## 2017-12-25 LAB — RAPID STREP SCREEN (MED CTR MEBANE ONLY): STREP GP A AG, IA W/REFLEX: NEGATIVE

## 2017-12-25 LAB — CULTURE, GROUP A STREP

## 2017-12-25 MED ORDER — CLINDAMYCIN HCL 300 MG PO CAPS: 300.0000 mg | ORAL_CAPSULE | Freq: Three times a day (TID) | ORAL | 1 refills | Status: DC

## 2017-12-25 MED ORDER — FLUTICASONE PROPIONATE 50 MCG/ACT NA SUSP: 2.0000 | Freq: Every day | NASAL | 6 refills | Status: DC

## 2017-12-25 MED ORDER — METHYLPREDNISOLONE ACETATE 80 MG/ML IJ SUSP: 80.0000 mg | Freq: Once | INTRAMUSCULAR | Status: DC

## 2017-12-29 NOTE — Patient Instructions (Signed)
In a few days you may receive a survey in the mail or online from [employer] regarding your visit with us today. Please take a moment to fill this out. Your feedback is very important to our whole office. It can help us better understand your needs as well as improve your experience and satisfaction. Thank you for taking your time to complete it. We care about you.  Nurah Petrides, PA-C  

## 2017-12-29 NOTE — Progress Notes (Signed)
BP 118/70   Pulse 79   Temp 98.2 F (36.8 C) (Oral)   Ht 5\' 3"  (1.6 m)   Wt 144 lb 6.4 oz (65.5 kg)   BMI 25.58 kg/m    Subjective:    Patient ID: Cassidy Barber, female    DOB: 17-Nov-1950, 67 y.o.   MRN: [id number]  HPI: Cassidy Barber is a 67 y.o. female presenting on 12/25/2017 for head congestion; chest congestion; and Sore Throat  This patient has had many days of sinus headache and postnasal drainage. There is copious drainage at times. Denies any fever at this time. There has been a history of sinus infections in the past.  No history of sinus surgery. There is cough at night. It has become more prevalent in recent days.  Relevant past medical, surgical, family and social history reviewed and updated as indicated. Allergies and medications reviewed and updated.  Past Medical History:  Diagnosis Date  . Anemia of chronic disease 10/26/2013  . Hernia of unspecified site of abdominal cavity without mention of obstruction or gangrene   . Hypertension   . Liver disease   . Lynch syndrome   . Lynch syndrome   . Primary biliary cirrhosis ([place]) 10/26/2013  . Thyroid disease     Past Surgical History:  Procedure Laterality Date  . ABDOMINAL HERNIA REPAIR     Patient's states that she has had 8- 9 hernia surgeries  . ABDOMINAL HYSTERECTOMY    . CHOLECYSTECTOMY  2007  . COLON RESECTION    . COLON SURGERY  2008   Done at [facility]  . COLONOSCOPY     Done at [facility]  . FLEXIBLE SIGMOIDOSCOPY N/A 10/20/2015   Procedure: FLEXIBLE SIGMOIDOSCOPY;  Surgeon: Rogene Houston, MD;  [facility];  Service: Endoscopy;  Laterality: N/A;  28 - Dr Laural Golden has meeting until 1:00  . FLEXIBLE SIGMOIDOSCOPY N/A 07/11/2016   Procedure: FLEXIBLE SIGMOIDOSCOPY;  Surgeon: Rogene Houston, MD;  Location: [facility];  Service: Endoscopy;  Laterality: N/A;  1200  . FLEXIBLE SIGMOIDOSCOPY N/A 08/09/2017   Procedure: FLEXIBLE SIGMOIDOSCOPY;  Surgeon: Rogene Houston, MD;  Location: [facility];   Service: Endoscopy;  Laterality: N/A;  1:00  . HERNIA REPAIR    . LIVER TRANSPLANT  [id number]  . POLYPECTOMY  08/09/2017   Procedure: POLYPECTOMY;  Surgeon: Rogene Houston, MD;  Location: [facility];  Service: Endoscopy;;  colon small bowel  . SPLENECTOMY  2006  . UPPER GASTROINTESTINAL ENDOSCOPY     Done at [facility]    Review of Systems  Constitutional: Positive for chills and fatigue. Negative for activity change and appetite change.  HENT: Positive for congestion, postnasal drip and sore throat.   Eyes: Negative.   Respiratory: Positive for cough and wheezing.   Cardiovascular: Negative.  Negative for chest pain, palpitations and leg swelling.  Gastrointestinal: Negative.   Genitourinary: Negative.   Musculoskeletal: Negative.   Skin: Negative.   Neurological: Positive for headaches.    Allergies as of 12/25/2017      Reactions   Ciprofloxacin Itching   Codeine Nausea Only   Oxycodone Nausea Only      Medication List        Accurate as of 12/25/17 11:59 PM. Always use your most recent med list.          acetaminophen 325 MG tablet Commonly known as:  TYLENOL Take 650 mg by mouth every 6 (six) hours as needed for mild pain  or moderate pain.   alendronate 70 MG tablet Commonly known as:  FOSAMAX Take 1 tablet (70 mg total) by mouth every 7 (seven) days. Take with a full glass of water on an empty stomach.   ALPRAZolam 0.5 MG tablet Commonly known as:  XANAX Take 1 tablet (0.5 mg total) by mouth at bedtime as needed.   aspirin EC 81 MG tablet Take 81 mg by mouth daily.   clindamycin 300 MG capsule Commonly known as:  CLEOCIN Take 1 capsule (300 mg total) by mouth 3 (three) times daily.   cyclobenzaprine 5 MG tablet Commonly known as:  FLEXERIL Take 1 tablet (5 mg total) by mouth 3 (three) times daily as needed for muscle spasms.   dicyclomine 20 MG tablet Commonly known as:  BENTYL TAKE ONE TABLET BY MOUTH THREE TIMES A DAY BEFORE MEALS     diphenoxylate-atropine 2.5-0.025 MG tablet Commonly known as:  LOMOTIL TAKE 1 TABLET BY MOUTH FOUR TIMES DAILY   EVEROLIMUS PO 6 mg. Takes 6 Po in the morning , 6 po at night.   ferrous sulfate 325 (65 FE) MG tablet Take 325 mg by mouth 2 (two) times daily with a meal.   fluticasone 50 MCG/ACT nasal spray Commonly known as:  FLONASE Place 2 sprays into both nostrils daily.   hydrOXYzine 25 MG capsule Commonly known as:  VISTARIL Take 25 mg by mouth 3 (three) times daily as needed.   levothyroxine 150 MCG tablet Commonly known as:  SYNTHROID, LEVOTHROID TAKE 1 TABLET BY MOUTH ONCE DAILY   loperamide 2 MG capsule Commonly known as:  IMODIUM TAKE 2 CAPSULES 4 TIMES DAILY AS NEEDED   losartan 25 MG tablet Commonly known as:  COZAAR TAKE ONE (1) TABLET EACH DAY   magnesium oxide 400 MG tablet Commonly known as:  MAG-OX Take 400 mg by mouth daily.   metoprolol succinate 25 MG 24 hr tablet Commonly known as:  TOPROL-XL TAKE 1 TABLET BY MOUTH ONCE DAILY   mupirocin ointment 2 % Commonly known as:  BACTROBAN Apply 1 application topically as needed. Reported on 11/30/2015   pantoprazole 40 MG tablet Commonly known as:  PROTONIX Take 1 tablet (40 mg total) by mouth daily before breakfast.   PREVALITE 4 g packet Generic drug:  cholestyramine light Take 4 g by mouth daily as needed.   tacrolimus 1 MG capsule Commonly known as:  PROGRAF Take 1-2 mg by mouth. Patient is taking 2 in the morning and 1 at night.   ursodiol 250 MG tablet Commonly known as:  ACTIGALL Take 250 mg by mouth 2 (two) times daily.   Vitamin D (Ergocalciferol) 50000 units Caps capsule Commonly known as:  DRISDOL Take 1 capsule (50,000 Units total) by mouth every 7 (seven) days.   Vitamin D3 2000 units capsule Take 1 capsule by mouth daily.          Objective:    BP 118/70   Pulse 79   Temp 98.2 F (36.8 C) (Oral)   Ht 5\' 3"  (1.6 m)   Wt 144 lb 6.4 oz (65.5 kg)   BMI 25.58 kg/m    Allergies  Allergen Reactions  . Ciprofloxacin Itching  . Codeine Nausea Only  . Oxycodone Nausea Only    Physical Exam  Constitutional: She is oriented to person, place, and time. She appears well-developed and well-nourished.  HENT:  Head: Normocephalic and atraumatic.  Right Ear: Tympanic membrane and external ear normal. No middle ear effusion.  Left Ear: Tympanic  membrane and external ear normal.  No middle ear effusion.  Nose: Mucosal edema and rhinorrhea present. Right sinus exhibits no maxillary sinus tenderness. Left sinus exhibits no maxillary sinus tenderness.  Mouth/Throat: Uvula is midline. Posterior oropharyngeal erythema present.  Eyes: Conjunctivae and EOM are normal. Pupils are equal, round, and reactive to light. Right eye exhibits no discharge. Left eye exhibits no discharge.  Neck: Normal range of motion.  Cardiovascular: Normal rate, regular rhythm and normal heart sounds.  Pulmonary/Chest: Effort normal and breath sounds normal. No respiratory distress. She has no wheezes.  Abdominal: Soft.  Lymphadenopathy:    She has no cervical adenopathy.  Neurological: She is alert and oriented to person, place, and time.  Skin: Skin is warm and dry.  Psychiatric: She has a normal mood and affect.    Results for orders placed or performed in visit on 12/25/17  Rapid Strep Screen (Not at [facility])  Result Value Ref Range   Strep Gp A Ag, IA W/Reflex Negative Negative  Culture, Group A Strep  Result Value Ref Range   Strep A Culture CANCELED       Assessment & Plan:   1. Sore throat - Rapid Strep Screen (Not at [facility]) - methylPREDNISolone acetate (DEPO-MEDROL) injection 80 mg - Culture, Group A Strep  2. Acute upper respiratory infection - methylPREDNISolone acetate (DEPO-MEDROL) injection 80 mg - clindamycin (CLEOCIN) 300 MG capsule; Take 1 capsule (300 mg total) by mouth 3 (three) times daily.  Dispense: 30 capsule; Refill: 1 - fluticasone (FLONASE) 50 MCG/ACT  nasal spray; Place 2 sprays into both nostrils daily.  Dispense: 16 g; Refill: 6  3. Strep pharyngitis     Current Outpatient Medications:  .  acetaminophen (TYLENOL) 325 MG tablet, Take 650 mg by mouth every 6 (six) hours as needed for mild pain or moderate pain. , Disp: , Rfl:  .  alendronate (FOSAMAX) 70 MG tablet, Take 1 tablet (70 mg total) by mouth every 7 (seven) days. Take with a full glass of water on an empty stomach., Disp: 12 tablet, Rfl: 2 .  ALPRAZolam (XANAX) 0.5 MG tablet, Take 1 tablet (0.5 mg total) by mouth at bedtime as needed., Disp: 30 tablet, Rfl: 5 .  aspirin EC 81 MG tablet, Take 81 mg by mouth daily., Disp: , Rfl:  .  Cholecalciferol (VITAMIN D3) 2000 UNITS capsule, Take 1 capsule by mouth daily., Disp: , Rfl:  .  clindamycin (CLEOCIN) 300 MG capsule, Take 1 capsule (300 mg total) by mouth 3 (three) times daily., Disp: 30 capsule, Rfl: 1 .  cyclobenzaprine (FLEXERIL) 5 MG tablet, Take 1 tablet (5 mg total) by mouth 3 (three) times daily as needed for muscle spasms., Disp: 60 tablet, Rfl: 1 .  dicyclomine (BENTYL) 20 MG tablet, TAKE ONE TABLET BY MOUTH THREE TIMES A DAY BEFORE MEALS, Disp: 90 tablet, Rfl: 5 .  diphenoxylate-atropine (LOMOTIL) 2.5-0.025 MG tablet, TAKE 1 TABLET BY MOUTH FOUR TIMES DAILY, Disp: 90 tablet, Rfl: 2 .  EVEROLIMUS PO, 6 mg. Takes 6 Po in the morning , 6 po at night., Disp: , Rfl:  .  ferrous sulfate 325 (65 FE) MG tablet, Take 325 mg by mouth 2 (two) times daily with a meal., Disp: , Rfl:  .  fluticasone (FLONASE) 50 MCG/ACT nasal spray, Place 2 sprays into both nostrils daily., Disp: 16 g, Rfl: 6 .  hydrOXYzine (VISTARIL) 25 MG capsule, Take 25 mg by mouth 3 (three) times daily as needed. , Disp: , Rfl:  .  levothyroxine (SYNTHROID, LEVOTHROID) 150 MCG tablet, TAKE 1 TABLET BY MOUTH ONCE DAILY, Disp: 90 tablet, Rfl: 0 .  loperamide (IMODIUM) 2 MG capsule, TAKE 2 CAPSULES 4 TIMES DAILY AS NEEDED, Disp: 90 capsule, Rfl: 1 .  losartan (COZAAR)  25 MG tablet, TAKE ONE (1) TABLET EACH DAY, Disp: 90 tablet, Rfl: 0 .  magnesium oxide (MAG-OX) 400 MG tablet, Take 400 mg by mouth daily., Disp: , Rfl:  .  metoprolol succinate (TOPROL-XL) 25 MG 24 hr tablet, TAKE 1 TABLET BY MOUTH ONCE DAILY, Disp: 90 tablet, Rfl: 1 .  mupirocin ointment (BACTROBAN) 2 %, Apply 1 application topically as needed. Reported on 11/30/2015, Disp: , Rfl: 1 .  pantoprazole (PROTONIX) 40 MG tablet, Take 1 tablet (40 mg total) by mouth daily before breakfast., Disp: 90 tablet, Rfl: 2 .  PREVALITE 4 g packet, Take 4 g by mouth daily as needed. , Disp: , Rfl: 11 .  tacrolimus (PROGRAF) 1 MG capsule, Take 1-2 mg by mouth. Patient is taking 2 in the morning and 1 at night., Disp: , Rfl:  .  ursodiol (ACTIGALL) 250 MG tablet, Take 250 mg by mouth 2 (two) times daily., Disp: , Rfl:  .  Vitamin D, Ergocalciferol, (DRISDOL) 50000 units CAPS capsule, Take 1 capsule (50,000 Units total) by mouth every 7 (seven) days. (Patient taking differently: Take 50,000 Units by mouth every 7 (seven) days. thursday), Disp: 12 capsule, Rfl: 3  Current Facility-Administered Medications:  .  methylPREDNISolone acetate (DEPO-MEDROL) injection 80 mg, 80 mg, Intramuscular, Once, Luvada Salamone S, PA-C Continue all other maintenance medications as listed above.  Follow up plan: No Follow-up on file.  Educational handout given for [place] PA-C [facility] [address]  [place], [place] [address] [phone]   12/29/2017, 4:37 PM

## 2017-12-31 ENCOUNTER — Ambulatory Visit (INDEPENDENT_AMBULATORY_CARE_PROVIDER_SITE_OTHER): Payer: Medicare Other | Admitting: Internal Medicine

## 2017-12-31 ENCOUNTER — Other Ambulatory Visit: Payer: Self-pay | Admitting: Family

## 2017-12-31 ENCOUNTER — Encounter (INDEPENDENT_AMBULATORY_CARE_PROVIDER_SITE_OTHER): Payer: Self-pay | Admitting: Internal Medicine

## 2017-12-31 VITALS — BP 118/74 | HR 72 | Temp 97.9°F | Resp 18 | Ht 66.0 in | Wt 144.2 lb

## 2017-12-31 DIAGNOSIS — K529 Noninfective gastroenteritis and colitis, unspecified: Secondary | ICD-10-CM

## 2017-12-31 DIAGNOSIS — K219 Gastro-esophageal reflux disease without esophagitis: Secondary | ICD-10-CM

## 2017-12-31 DIAGNOSIS — I10 Essential (primary) hypertension: Secondary | ICD-10-CM

## 2017-12-31 DIAGNOSIS — Z85038 Personal history of other malignant neoplasm of large intestine: Secondary | ICD-10-CM | POA: Diagnosis not present

## 2017-12-31 NOTE — Progress Notes (Signed)
Presenting complaint:   Follow-up for chronic diarrhe and GERD.  Subjective:  Patient is 68 year old Caucasian female who is here for scheduled visit.  She was last seen in the office on 06/25/2017.  She underwent flexible sigmoidoscopy in September 2018. She states she is doing well.  She is not taking dicyclomine or diphenoxylate anymore.  She is having 3-4 bowel movements per day.  Most of her stools are soft.  She knows what foods to avoid.  She does not take Imodium OTC daily.  She only takes it on days when she has to leave her house. She is on p.o. iron and her stools are black.  She denies rectal bleeding.  She also denies abdominal pain.  She uses support because of ventral hernia.  She states she was begun on medication for osteoporosis.  Heartburn is well controlled with therapy. She states she had biliary stent removed back in December 2018 and her liver tests have been normal. She has good appetite.  She has gained 4 pounds since her last visit.   Current Medications: Outpatient Encounter Medications as of 12/31/2017  Medication Sig  . acetaminophen (TYLENOL) 325 MG tablet Take 650 mg by mouth every 6 (six) hours as needed for mild pain or moderate pain.   [name] alendronate (FOSAMAX) 70 MG tablet Take 1 tablet (70 mg total) by mouth every 7 (seven) days. Take with a full glass of water on an empty stomach.  . ALPRAZolam (XANAX) 0.5 MG tablet Take 1 tablet (0.5 mg total) by mouth at bedtime as needed.  [name] aspirin EC 81 MG tablet Take 81 mg by mouth daily.  [name] CALCIUM CITRATE PO Take 1,200 mg by mouth.  . Cholecalciferol (VITAMIN D3) 2000 UNITS capsule Take 1 capsule by mouth daily.  . clindamycin (CLEOCIN) 300 MG capsule Take 1 capsule (300 mg total) by mouth 3 (three) times daily.  . cyclobenzaprine (FLEXERIL) 5 MG tablet Take 1 tablet (5 mg total) by mouth 3 (three) times daily as needed for muscle spasms.  [name] dicyclomine (BENTYL) 20 MG tablet TAKE ONE TABLET BY MOUTH THREE TIMES A DAY  BEFORE MEALS  . diphenoxylate-atropine (LOMOTIL) 2.5-0.025 MG tablet TAKE 1 TABLET BY MOUTH FOUR TIMES DAILY  . EVEROLIMUS PO 6 mg. Takes 6 Po in the morning , 6 po at night.  . ferrous sulfate 325 (65 FE) MG tablet Take 325 mg by mouth 2 (two) times daily with a meal.  . fluticasone (FLONASE) 50 MCG/ACT nasal spray Place 2 sprays into both nostrils daily.  . hydrOXYzine (VISTARIL) 25 MG capsule Take 25 mg by mouth 3 (three) times daily as needed.   [name] levothyroxine (SYNTHROID, LEVOTHROID) 150 MCG tablet TAKE 1 TABLET BY MOUTH ONCE DAILY  . loperamide (IMODIUM) 2 MG capsule TAKE 2 CAPSULES 4 TIMES DAILY AS NEEDED  . losartan (COZAAR) 25 MG tablet TAKE ONE (1) TABLET EACH DAY  . magnesium oxide (MAG-OX) 400 MG tablet Take 400 mg by mouth daily.  . metoprolol succinate (TOPROL-XL) 25 MG 24 hr tablet TAKE 1 TABLET BY MOUTH ONCE DAILY  . Multiple Vitamins-Minerals (HAIR SKIN AND NAILS FORMULA PO) Take by mouth 2 (two) times daily.  . Multiple Vitamins-Minerals (WOMENS 50+ MULTI VITAMIN/MIN PO) Take by mouth daily.  . pantoprazole (PROTONIX) 40 MG tablet Take 1 tablet (40 mg total) by mouth daily before breakfast.  . tacrolimus (PROGRAF) 1 MG capsule Take 1-2 mg by mouth. Patient is taking 2 in the morning and 1 at night.  [name]  ursodiol (ACTIGALL) 250 MG tablet Take 250 mg by mouth 2 (two) times daily.  . Vitamin D, Ergocalciferol, (DRISDOL) 50000 units CAPS capsule Take 1 capsule (50,000 Units total) by mouth every 7 (seven) days. (Patient taking differently: Take 50,000 Units by mouth every 7 (seven) days. thursday)  . vitamin E (VITAMIN E) 400 UNIT capsule Take 400 Units by mouth daily.  . [DISCONTINUED] mupirocin ointment (BACTROBAN) 2 % Apply 1 application topically as needed. Reported on 11/30/2015  . [DISCONTINUED] PREVALITE 4 g packet Take 4 g by mouth daily as needed.    Facility-Administered Encounter Medications as of 12/31/2017  Medication  . methylPREDNISolone acetate (DEPO-MEDROL) injection  80 mg     Objective: Blood pressure 118/74, pulse 72, temperature 97.9 F (36.6 C), temperature source Oral, resp. rate 18, height 5\' 6"  (1.676 m), weight 144 lb 3.2 oz (65.4 kg). Patient is alert and in no acute distress. Conjunctiva is pink. Sclera is nonicteric Oropharyngeal mucosa is normal. No neck masses or thyromegaly noted. Cardiac exam with regular rhythm normal S1 and S2. No murmur or gallop noted. Lungs are clear to auscultation. Abdomen is symmetrical.  She has multiple scars.  She has large ventral hernia in epigastric region.  Abdomen is soft and nontender with organomegaly or masses. No LE edema or clubbing noted.  Labs/studies Results: Lab data from 12/18/2017  WBC 4.8, H&H 10.8 and 34.1 and platelet count 322K.  BUN 15, creatinine 0.86. Bilirubin 0.2.  AP 100, AST 24, ALT 19, total protein 5.8 with albumin of 3.3 and serum calcium 8.3.   Assessment:  #1.  Chronic diarrhea secondary to loss of most of her colon because of colon carcinoma.   she has adapted quite well.  She is not requiring diphenoxylate or dicyclomine.  She is using Imodium OTC on as-needed basis.  #2.  History of colon carcinoma.  Patient has Lynch syndrome.  She underwent subtotal colectomy.  She has rectum and distal sigmoid colon remaining.  Last exam was in September 2018 and next exam would be in 01 August 2018  #3.  GERD.  She is doing well with PPI therapy.  Will trial her on every other day dose and see what happens.  If she does well prescription will be changed at next refill.  #4.  History of PBC.  Status post LDLT at [facility].  Now she is followed at [facility].   Plan:  Patient will try pantoprazole 40 mg every other day. Flexible sigmoidoscopy in September 2019. Office visit in 1 year.

## 2017-12-31 NOTE — Patient Instructions (Addendum)
Next flexible sigmoidoscopy in September 2019. Can try pantoprazole 4 times a week as discussed.

## 2018-01-01 ENCOUNTER — Other Ambulatory Visit: Payer: Self-pay | Admitting: Family

## 2018-01-01 DIAGNOSIS — Z1231 Encounter for screening mammogram for malignant neoplasm of breast: Secondary | ICD-10-CM | POA: Diagnosis not present

## 2018-01-01 DIAGNOSIS — I10 Essential (primary) hypertension: Secondary | ICD-10-CM

## 2018-01-01 MED ORDER — LEVOTHYROXINE SODIUM 150 MCG PO TABS: 150.0000 ug | ORAL_TABLET | Freq: Every day | ORAL | 0 refills | Status: DC

## 2018-01-01 MED ORDER — LOSARTAN POTASSIUM 25 MG PO TABS
ORAL_TABLET | ORAL | 0 refills | Status: DC
Start: 2018-01-01 — End: 2018-01-27

## 2018-01-03 DIAGNOSIS — H6993 Unspecified Eustachian tube disorder, bilateral: Secondary | ICD-10-CM | POA: Diagnosis not present

## 2018-01-03 DIAGNOSIS — H6523 Chronic serous otitis media, bilateral: Secondary | ICD-10-CM | POA: Diagnosis not present

## 2018-01-15 ENCOUNTER — Other Ambulatory Visit: Payer: Medicare Other

## 2018-01-15 DIAGNOSIS — Z298 Encounter for other specified prophylactic measures: Secondary | ICD-10-CM | POA: Diagnosis not present

## 2018-01-15 DIAGNOSIS — Z944 Liver transplant status: Secondary | ICD-10-CM

## 2018-01-17 LAB — CBC WITH DIFFERENTIAL/PLATELET
BASOS: 3 %
Basophils Absolute: 0.2 10*3/uL (ref 0.0–0.2)
EOS (ABSOLUTE): 0.2 10*3/uL (ref 0.0–0.4)
EOS: 4 %
Hematocrit: 36.2 % (ref 34.0–46.6)
Hemoglobin: 11.5 g/dL (ref 11.1–15.9)
IMMATURE GRANS (ABS): 0 10*3/uL (ref 0.0–0.1)
IMMATURE GRANULOCYTES: 0 %
LYMPHS: 39 %
Lymphocytes Absolute: 2.3 10*3/uL (ref 0.7–3.1)
MCH: 27 pg (ref 26.6–33.0)
MCHC: 31.8 g/dL (ref 31.5–35.7)
MCV: 85 fL (ref 79–97)
Monocytes Absolute: 1.2 10*3/uL — ABNORMAL HIGH (ref 0.1–0.9)
Monocytes: 21 %
NEUTROS PCT: 33 %
Neutrophils Absolute: 1.9 10*3/uL (ref 1.4–7.0)
PLATELETS: 316 10*3/uL (ref 150–379)
RBC: 4.26 x10E6/uL (ref 3.77–5.28)
RDW: 14.4 % (ref 12.3–15.4)
WBC: 5.7 10*3/uL (ref 3.4–10.8)

## 2018-01-17 LAB — CMP14+EGFR
ALT: 18 IU/L (ref 0–32)
AST: 24 IU/L (ref 0–40)
Albumin/Globulin Ratio: 1.3 (ref 1.2–2.2)
Albumin: 3.7 g/dL (ref 3.6–4.8)
Alkaline Phosphatase: 122 IU/L — ABNORMAL HIGH (ref 39–117)
BUN/Creatinine Ratio: 20 (ref 12–28)
BUN: 16 mg/dL (ref 8–27)
Bilirubin Total: <0.2 mg/dL (ref 0.0–1.2)
CALCIUM: 9 mg/dL (ref 8.7–10.3)
CO2: 25 mmol/L (ref 20–29)
CREATININE: 0.79 mg/dL (ref 0.57–1.00)
Chloride: 104 mmol/L (ref 96–106)
GFR calc Af Amer: 90 mL/min/{1.73_m2} (ref >59)
GFR, EST NON AFRICAN AMERICAN: 78 mL/min/{1.73_m2} (ref >59)
GLUCOSE: 70 mg/dL (ref 65–99)
Globulin, Total: 2.8 g/dL (ref 1.5–4.5)
POTASSIUM: 4.8 mmol/L (ref 3.5–5.2)
Sodium: 140 mmol/L (ref 134–144)
Total Protein: 6.5 g/dL (ref 6.0–8.5)

## 2018-01-17 LAB — EVEROLIMUS: Everolimus: 6 ng/mL (ref 3.0–8.0)

## 2018-01-17 LAB — MAGNESIUM: MAGNESIUM: 2 mg/dL (ref 1.6–2.3)

## 2018-01-17 LAB — TACROLIMUS LEVEL: Tacrolimus Lvl: 2.2 ng/mL (ref 2.0–20.0)

## 2018-01-21 ENCOUNTER — Other Ambulatory Visit: Payer: Self-pay | Admitting: Physician Assistant

## 2018-01-21 DIAGNOSIS — D485 Neoplasm of uncertain behavior of skin: Secondary | ICD-10-CM | POA: Diagnosis not present

## 2018-01-21 DIAGNOSIS — L570 Actinic keratosis: Secondary | ICD-10-CM | POA: Diagnosis not present

## 2018-01-21 DIAGNOSIS — D489 Neoplasm of uncertain behavior, unspecified: Secondary | ICD-10-CM | POA: Diagnosis not present

## 2018-01-21 DIAGNOSIS — D044 Carcinoma in situ of skin of scalp and neck: Secondary | ICD-10-CM | POA: Diagnosis not present

## 2018-01-21 DIAGNOSIS — C44529 Squamous cell carcinoma of skin of other part of trunk: Secondary | ICD-10-CM | POA: Diagnosis not present

## 2018-01-21 DIAGNOSIS — C44622 Squamous cell carcinoma of skin of right upper limb, including shoulder: Secondary | ICD-10-CM | POA: Diagnosis not present

## 2018-01-21 DIAGNOSIS — D229 Melanocytic nevi, unspecified: Secondary | ICD-10-CM

## 2018-01-21 DIAGNOSIS — C4490 Unspecified malignant neoplasm of skin, unspecified: Secondary | ICD-10-CM | POA: Diagnosis not present

## 2018-01-21 HISTORY — DX: Melanocytic nevi, unspecified: D22.9

## 2018-01-22 ENCOUNTER — Encounter: Payer: Self-pay | Admitting: Family

## 2018-01-24 DIAGNOSIS — H6993 Unspecified Eustachian tube disorder, bilateral: Secondary | ICD-10-CM | POA: Diagnosis not present

## 2018-01-24 DIAGNOSIS — H90A22 Sensorineural hearing loss, unilateral, left ear, with restricted hearing on the contralateral side: Secondary | ICD-10-CM | POA: Diagnosis not present

## 2018-01-24 DIAGNOSIS — H90A11 Conductive hearing loss, unilateral, right ear with restricted hearing on the contralateral side: Secondary | ICD-10-CM | POA: Diagnosis not present

## 2018-01-27 ENCOUNTER — Ambulatory Visit: Payer: Medicare Other | Admitting: Family

## 2018-01-27 ENCOUNTER — Encounter: Payer: Self-pay | Admitting: Family

## 2018-01-27 ENCOUNTER — Ambulatory Visit (INDEPENDENT_AMBULATORY_CARE_PROVIDER_SITE_OTHER): Payer: Medicare Other

## 2018-01-27 VITALS — BP 139/86 | HR 78 | Temp 97.2°F | Ht 66.0 in | Wt 143.4 lb

## 2018-01-27 DIAGNOSIS — M7582 Other shoulder lesions, left shoulder: Secondary | ICD-10-CM

## 2018-01-27 DIAGNOSIS — M19012 Primary osteoarthritis, left shoulder: Secondary | ICD-10-CM | POA: Diagnosis not present

## 2018-01-27 DIAGNOSIS — M25512 Pain in left shoulder: Secondary | ICD-10-CM

## 2018-01-27 MED ORDER — NAPROXEN 500 MG PO TABS: 500.0000 mg | ORAL_TABLET | Freq: Two times a day (BID) | ORAL | 1 refills | Status: DC

## 2018-01-27 MED ORDER — PREDNISONE 10 MG (21) PO TBPK: ORAL_TABLET | ORAL | 0 refills | Status: DC

## 2018-01-27 NOTE — Patient Instructions (Signed)
Rotator Cuff Tendinitis Rotator cuff tendinitis is inflammation of the tough, cord-like bands that connect muscle to bone (tendons) in the rotator cuff. The rotator cuff includes all of the muscles and tendons that connect the arm to the shoulder. The rotator cuff holds the head of the upper arm bone (humerus) in the cup (fossa) of the shoulder blade (scapula). This condition can lead to a long-lasting (chronic) tear. The tear may be partial or complete. What are the causes? This condition is usually caused by overusing the rotator cuff. What increases the risk? This condition is more likely to develop in athletes and workers who frequently use their shoulder or reach over their heads. This can include activities such as:  Tennis.  Baseball or softball.  Swimming.  Construction work.  Painting.  What are the signs or symptoms? Symptoms of this condition include:  Pain spreading (radiating) from the shoulder to the upper arm.  Swelling and tenderness in front of the shoulder.  Pain when reaching, pulling, or lifting the arm above the head.  Pain when lowering the arm from above the head.  Minor pain in the shoulder when resting.  Increased pain in the shoulder at night.  Difficulty placing the arm behind the back.  How is this diagnosed? This condition is diagnosed with a medical history and physical exam. Tests may also be done, including:  X-rays.  MRI.  Ultrasounds.  CT or MR arthrogram. During this test, a contrast material is injected and then images are taken.  How is this treated? Treatment for this condition depends on the severity of the condition. In less severe cases, treatment may include:  Rest. This may be done with a sling that holds the shoulder still (immobilization). Your health care provider may also recommend avoiding activities that involve lifting your arm over your head.  Icing the shoulder.  Anti-inflammatory medicines, such as aspirin or  ibuprofen.  In more severe cases, treatment may include:  Physical therapy.  Steroid injections.  Surgery.  Follow these instructions at home: If you have a sling:  Wear the sling as told by your health care provider. Remove it only as told by your health care provider.  Loosen the sling if your fingers tingle, become numb, or turn cold and blue.  Keep the sling clean.  If the sling is not waterproof, do not let it get wet. Remove it, if allowed, or cover it with a watertight covering when you take a bath or shower. Managing pain, stiffness, and swelling  If directed, put ice on the injured area. ? If you have a removable sling, remove it as told by your health care provider. ? Put ice in a plastic bag. ? Place a towel between your skin and the bag. ? Leave the ice on for 20 minutes, 2-3 times a day.  Move your fingers often to avoid stiffness and to lessen swelling.  Raise (elevate) the injured area above the level of your heart while you are lying down.  Find a comfortable sleeping position or sleep on a recliner, if available. Driving  Do not drive or use heavy machinery while taking prescription pain medicine.  Ask your health care provider when it is safe to drive if you have a sling on your arm. Activity  Rest your shoulder as told by your health care provider.  Return to your normal activities as told by your health care provider. Ask your health care provider what activities are safe for you.  Do any   exercises or stretches as told by your health care provider.  If you do repetitive overhead tasks, take small breaks in between and include stretching exercises as told by your health care provider. General instructions  Do not use any products that contain nicotine or tobacco, such as cigarettes and e-cigarettes. These can delay healing. If you need help quitting, ask your health care provider.  Take over-the-counter and prescription medicines only as told by  your health care provider.  Keep all follow-up visits as told by your health care provider. This is important. Contact a health care provider if:  Your pain gets worse.  You have new pain in your arm, hands, or fingers.  Your pain is not relieved with medicine or does not get better after 6 weeks of treatment.  You have cracking sensations when moving your shoulder in certain directions.  You hear a snapping sound after using your shoulder, followed by severe pain and weakness. Get help right away if:  Your arm, hand, or fingers are numb or tingling.  Your arm, hand, or fingers are swollen or painful or they turn white or blue. Summary  Rotator cuff tendinitis is inflammation of the tough, cord-like bands that connect muscle to bone (tendons) in the rotator cuff.  This condition is usually caused by overusing the rotator cuff, which includes all of the muscles and tendons that connect the arm to the shoulder.  This condition is more likely to develop in athletes and workers who frequently use their shoulder or reach over their heads.  Treatment generally includes rest, anti-inflammatory medicines, and icing. In some cases, physical therapy and steroid injections may be needed. In severe cases, surgery may be needed. This information is not intended to replace advice given to you by your health care provider. Make sure you discuss any questions you have with your health care provider. Document Released: 01/19/2004 Document Revised: 10/15/2016 Document Reviewed: 10/15/2016 Elsevier Interactive Patient Education  2017 [employer]  

## 2018-01-27 NOTE — Progress Notes (Signed)
   Subjective:    Patient ID: Cassidy Barber, female    DOB: 03/17/1951, 67 y.o.   MRN: [id number]  Shoulder Pain   The pain is present in the left shoulder. This is a new problem. The current episode started 1 to 4 weeks ago. There has been no history of extremity trauma. The problem occurs intermittently. The problem has been waxing and waning. The quality of the pain is described as aching. The pain is at a severity of 10/10. The pain is moderate. Associated symptoms include an inability to bear weight and a limited range of motion. Pertinent negatives include no numbness or stiffness. The symptoms are aggravated by activity. She has tried acetaminophen, rest and NSAIDS for the symptoms. The treatment provided mild relief.      Review of Systems  Musculoskeletal: Negative for stiffness.  Neurological: Negative for numbness.  All other systems reviewed and are negative.      Objective:   Physical Exam  Constitutional: She is oriented to person, place, and time. She appears well-developed and well-nourished. No distress.  HENT:  Head: Normocephalic and atraumatic.  Right Ear: External ear normal.  Left Ear: External ear normal.  Nose: Nose normal.  Mouth/Throat: Oropharynx is clear and moist.  Eyes: Pupils are equal, round, and reactive to light.  Neck: Normal range of motion. Neck supple. No thyromegaly present.  Cardiovascular: Normal rate, regular rhythm, normal heart sounds and intact distal pulses.  No murmur heard. Pulmonary/Chest: Effort normal and breath sounds normal. No respiratory distress. She has no wheezes.  Abdominal: Soft. Bowel sounds are normal. She exhibits no distension. There is no tenderness.  Musculoskeletal: She exhibits tenderness. She exhibits no edema.  Left shoulder pain with abduction  Neurological: She is alert and oriented to person, place, and time.  Skin: Skin is warm and dry.  Psychiatric: She has a normal mood and affect. Her behavior is normal.  Judgment and thought content normal.  Vitals reviewed.   X-ray-   BP 139/86   Pulse 78   Temp (!) 97.2 F (36.2 C) (Oral)   Ht 5\' 6"  (1.676 m)   Wt 143 lb 6.4 oz (65 kg)   BMI 23.15 kg/m      Assessment & Plan:  1. Tendinitis of left rotator cuff Rest Ice  ROM exercises encouraged RTO prn  - DG Shoulder Left; Future - predniSONE (STERAPRED UNI-PAK 21 TAB) 10 MG (21) TBPK tablet; Use as directed  Dispense: 21 tablet; Refill: 0 - naproxen (NAPROSYN) 500 MG tablet; Take 1 tablet (500 mg total) by mouth 2 (two) times daily with a meal.  Dispense: 60 tablet; Refill: 1  2. Acute pain of left shoulder - DG Shoulder Left; Future    [name], FNP

## 2018-02-10 DIAGNOSIS — Z944 Liver transplant status: Secondary | ICD-10-CM | POA: Diagnosis not present

## 2018-02-13 ENCOUNTER — Other Ambulatory Visit: Payer: Medicare Other

## 2018-02-13 DIAGNOSIS — Z298 Encounter for other specified prophylactic measures: Secondary | ICD-10-CM

## 2018-02-13 DIAGNOSIS — Z944 Liver transplant status: Secondary | ICD-10-CM

## 2018-02-14 LAB — CBC WITH DIFFERENTIAL/PLATELET
BASOS: 1 %
Basophils Absolute: 0.1 10*3/uL (ref 0.0–0.2)
EOS (ABSOLUTE): 0.2 10*3/uL (ref 0.0–0.4)
EOS: 3 %
HEMATOCRIT: 32.2 % — AB (ref 34.0–46.6)
HEMOGLOBIN: 10.4 g/dL — AB (ref 11.1–15.9)
Immature Grans (Abs): 0 10*3/uL (ref 0.0–0.1)
Immature Granulocytes: 0 %
LYMPHS ABS: 2.5 10*3/uL (ref 0.7–3.1)
Lymphs: 35 %
MCH: 25.9 pg — ABNORMAL LOW (ref 26.6–33.0)
MCHC: 32.3 g/dL (ref 31.5–35.7)
MCV: 80 fL (ref 79–97)
MONOCYTES: 16 %
Monocytes Absolute: 1.2 10*3/uL — ABNORMAL HIGH (ref 0.1–0.9)
NEUTROS ABS: 3.2 10*3/uL (ref 1.4–7.0)
Neutrophils: 45 %
Platelets: 331 10*3/uL (ref 150–379)
RBC: 4.01 x10E6/uL (ref 3.77–5.28)
RDW: 14.1 % (ref 12.3–15.4)
WBC: 7.2 10*3/uL (ref 3.4–10.8)

## 2018-02-14 LAB — CMP14+EGFR
ALBUMIN: 3.3 g/dL — AB (ref 3.6–4.8)
ALT: 19 IU/L (ref 0–32)
AST: 24 IU/L (ref 0–40)
Albumin/Globulin Ratio: 1.4 (ref 1.2–2.2)
Alkaline Phosphatase: 100 IU/L (ref 39–117)
BUN / CREAT RATIO: 17 (ref 12–28)
BUN: 14 mg/dL (ref 8–27)
Bilirubin Total: <0.2 mg/dL (ref 0.0–1.2)
CALCIUM: 8.8 mg/dL (ref 8.7–10.3)
CO2: 24 mmol/L (ref 20–29)
CREATININE: 0.83 mg/dL (ref 0.57–1.00)
Chloride: 104 mmol/L (ref 96–106)
GFR, EST AFRICAN AMERICAN: 85 mL/min/{1.73_m2} (ref >59)
GFR, EST NON AFRICAN AMERICAN: 74 mL/min/{1.73_m2} (ref >59)
GLUCOSE: 97 mg/dL (ref 65–99)
Globulin, Total: 2.4 g/dL (ref 1.5–4.5)
Potassium: 4.5 mmol/L (ref 3.5–5.2)
SODIUM: 140 mmol/L (ref 134–144)
TOTAL PROTEIN: 5.7 g/dL — AB (ref 6.0–8.5)

## 2018-02-14 LAB — EVEROLIMUS: Everolimus: 8.2 ng/mL — ABNORMAL HIGH (ref 3.0–8.0)

## 2018-02-14 LAB — MAGNESIUM: Magnesium: 1.9 mg/dL (ref 1.6–2.3)

## 2018-02-14 LAB — TACROLIMUS LEVEL: TACROLIMUS LVL: 2.3 ng/mL (ref 2.0–20.0)

## 2018-02-24 ENCOUNTER — Other Ambulatory Visit: Payer: Self-pay | Admitting: Physician Assistant

## 2018-02-24 DIAGNOSIS — J069 Acute upper respiratory infection, unspecified: Secondary | ICD-10-CM

## 2018-03-03 ENCOUNTER — Encounter: Payer: Self-pay | Admitting: Family

## 2018-03-03 ENCOUNTER — Ambulatory Visit (INDEPENDENT_AMBULATORY_CARE_PROVIDER_SITE_OTHER): Payer: Medicare Other

## 2018-03-03 ENCOUNTER — Ambulatory Visit: Payer: Medicare Other | Admitting: Family

## 2018-03-03 VITALS — BP 124/70 | HR 94 | Temp 97.1°F | Ht 66.0 in | Wt 148.4 lb

## 2018-03-03 DIAGNOSIS — R0781 Pleurodynia: Secondary | ICD-10-CM

## 2018-03-03 DIAGNOSIS — W108XXA Fall (on) (from) other stairs and steps, initial encounter: Secondary | ICD-10-CM | POA: Diagnosis not present

## 2018-03-03 DIAGNOSIS — S20212A Contusion of left front wall of thorax, initial encounter: Secondary | ICD-10-CM | POA: Diagnosis not present

## 2018-03-03 DIAGNOSIS — M25512 Pain in left shoulder: Secondary | ICD-10-CM

## 2018-03-03 DIAGNOSIS — S4992XA Unspecified injury of left shoulder and upper arm, initial encounter: Secondary | ICD-10-CM | POA: Diagnosis not present

## 2018-03-03 NOTE — Progress Notes (Signed)
   Subjective:    Patient ID: Cassidy Barber, female    DOB: [birth year], 67 y.o.   MRN: [id number]  Chest Pain    Fall  The accident occurred 3 to 5 days ago. The fall occurred while standing. She fell from a height of 3 to 5 ft. She landed on grass. The pain is present in the right upper arm (left rib). The pain is moderate. The symptoms are aggravated by rotation. Pertinent negatives include no bowel incontinence, hematuria, loss of consciousness, tingling or visual change. She has tried elevation, NSAID and rest for the symptoms. The treatment provided mild relief.      Review of Systems  Cardiovascular: Positive for chest pain.  Gastrointestinal: Negative for bowel incontinence.  Genitourinary: Negative for hematuria.  Neurological: Negative for tingling and loss of consciousness.  All other systems reviewed and are negative.      Objective:   Physical Exam  Constitutional: She is oriented to person, place, and time. She appears well-developed and well-nourished. No distress.  HENT:  Head: Normocephalic.  Neck: Normal range of motion. Neck supple. No thyromegaly present.  Cardiovascular: Normal rate, regular rhythm, normal heart sounds and intact distal pulses.  No murmur heard. Pulmonary/Chest: Effort normal and breath sounds normal. No respiratory distress. She has no wheezes.  Abdominal: Soft. Bowel sounds are normal. She exhibits no distension. There is no tenderness.  Musculoskeletal: She exhibits tenderness. She exhibits no edema.  Unable to lift arm greater than 45 degrees, left rib pain with deep breathing and palpation  Neurological: She is alert and oriented to person, place, and time.  Skin: Skin is warm and dry.  Psychiatric: She has a normal mood and affect. Her behavior is normal. Judgment and thought content normal.  Vitals reviewed.    BP 124/70   Pulse 94   Temp (!) 97.1 F (36.2 C) (Oral)   Ht 5\' 6"  (1.676 m)   Wt 148 lb 6.4 oz (67.3 kg)   BMI 23.95  kg/m      Assessment & Plan:  1. Fall down stairs, initial encounter - DG Shoulder Left; Future - DG Ribs Unilateral W/Chest Left; Future  2. Acute pain of left shoulder - DG Shoulder Left; Future  3. Rib pain on left side - DG Ribs Unilateral W/Chest Left; Future  4. Contusion of rib on left side, initial encounter   Fall preventions  Discussed Rest Ice ROM exercises encouraged Continue naproxen  RTO prn and keep chronic follow up   Cassidy Dun, FNP

## 2018-03-03 NOTE — Patient Instructions (Signed)
Rib Contusion A rib contusion is a deep bruise on your rib area. Contusions are the result of a blunt trauma that causes bleeding and injury to the tissues under the skin. A rib contusion may involve bruising of the ribs and of the skin and muscles in the area. The skin overlying the contusion may turn blue, purple, or yellow. Minor injuries will give you a painless contusion, but more severe contusions may stay painful and swollen for a few weeks. What are the causes? A contusion is usually caused by a blow, trauma, or direct force to an area of the body. This often occurs while playing contact sports. What are the signs or symptoms?  Swelling and redness of the injured area.  Discoloration of the injured area.  Tenderness and soreness of the injured area.  Pain with or without movement. How is this diagnosed? The diagnosis can be made by taking a medical history and performing a physical exam. An X-ray, CT scan, or MRI may be needed to determine if there were any associated injuries, such as broken bones (fractures) or internal injuries. How is this treated? Often, the best treatment for a rib contusion is rest. Icing or applying cold compresses to the injured area may help reduce swelling and inflammation. Deep breathing exercises may be recommended to reduce the risk of partial lung collapse and pneumonia. Over-the-counter or prescription medicines may also be recommended for pain control. Follow these instructions at home:  Apply ice to the injured area: ? Put ice in a plastic bag. ? Place a towel between your skin and the bag. ? Leave the ice on for 20 minutes, 2-3 times per day.  Take medicines only as directed by your health care provider.  Rest the injured area. Avoid strenuous activity and any activities or movements that cause pain. Be careful during activities and avoid bumping the injured area.  Perform deep-breathing exercises as directed by your health care provider.  Do  not lift anything that is heavier than 5 lb (2.3 kg) until your health care provider approves.  Do not use any tobacco products, including cigarettes, chewing tobacco, or electronic cigarettes. If you need help quitting, ask your health care provider. Contact a health care provider if:  You have increased bruising or swelling.  You have pain that is not controlled with treatment.  You have a fever. Get help right away if:  You have difficulty breathing or shortness of breath.  You develop a continual cough, or you cough up thick or bloody sputum.  You feel sick to your stomach (nauseous), you throw up (vomit), or you have abdominal pain. This information is not intended to replace advice given to you by your health care provider. Make sure you discuss any questions you have with your health care provider. Document Released: 07/24/2001 Document Revised: 04/05/2016 Document Reviewed: 08/10/2014 Elsevier Interactive Patient Education  2018 [employer]  

## 2018-03-07 DIAGNOSIS — C4449 Other specified malignant neoplasm of skin of scalp and neck: Secondary | ICD-10-CM | POA: Diagnosis not present

## 2018-03-07 DIAGNOSIS — D485 Neoplasm of uncertain behavior of skin: Secondary | ICD-10-CM | POA: Diagnosis not present

## 2018-03-10 ENCOUNTER — Encounter: Payer: Self-pay | Admitting: Family

## 2018-03-10 ENCOUNTER — Other Ambulatory Visit: Payer: Self-pay | Admitting: Family

## 2018-03-10 ENCOUNTER — Other Ambulatory Visit: Payer: Self-pay

## 2018-03-10 DIAGNOSIS — Z944 Liver transplant status: Secondary | ICD-10-CM | POA: Diagnosis not present

## 2018-03-10 MED ORDER — PANTOPRAZOLE SODIUM 40 MG PO TBEC: 40.0000 mg | DELAYED_RELEASE_TABLET | Freq: Every day | ORAL | 2 refills | Status: DC

## 2018-03-10 MED ORDER — PREDNISONE 10 MG (21) PO TBPK: ORAL_TABLET | ORAL | 0 refills | Status: DC

## 2018-03-12 DIAGNOSIS — Z85828 Personal history of other malignant neoplasm of skin: Secondary | ICD-10-CM | POA: Diagnosis not present

## 2018-03-12 DIAGNOSIS — Z944 Liver transplant status: Secondary | ICD-10-CM | POA: Diagnosis not present

## 2018-03-12 DIAGNOSIS — D899 Disorder involving the immune mechanism, unspecified: Secondary | ICD-10-CM | POA: Diagnosis not present

## 2018-03-31 ENCOUNTER — Encounter: Payer: Self-pay | Admitting: Family

## 2018-03-31 ENCOUNTER — Ambulatory Visit: Payer: Medicare Other | Admitting: Family

## 2018-03-31 VITALS — BP 142/70 | HR 70 | Temp 97.1°F | Ht 66.0 in | Wt 148.8 lb

## 2018-03-31 DIAGNOSIS — S46002D Unspecified injury of muscle(s) and tendon(s) of the rotator cuff of left shoulder, subsequent encounter: Secondary | ICD-10-CM

## 2018-03-31 DIAGNOSIS — Z298 Encounter for other specified prophylactic measures: Secondary | ICD-10-CM | POA: Diagnosis not present

## 2018-03-31 DIAGNOSIS — Z944 Liver transplant status: Secondary | ICD-10-CM | POA: Diagnosis not present

## 2018-03-31 DIAGNOSIS — Z23 Encounter for immunization: Secondary | ICD-10-CM

## 2018-03-31 NOTE — Patient Instructions (Signed)

## 2018-03-31 NOTE — Progress Notes (Signed)
   Subjective:    Patient ID: Cassidy Barber, female    DOB: 08/23/1951, 67 y.o.   MRN: [id number]  Chief Complaint  Patient presents with  . left arm pain    three weeks    Shoulder Injury   The left shoulder is affected. The incident occurred more than 1 week ago. The injury mechanism was a fall. The quality of the pain is described as aching. The pain radiates to the left hand and left arm. The pain is at a severity of 10/10. The pain is moderate. Pertinent negatives include no muscle weakness. The symptoms are aggravated by movement and palpation. She has tried acetaminophen and rest for the symptoms.      Review of Systems  All other systems reviewed and are negative.      Objective:   Physical Exam  Constitutional: She is oriented to person, place, and time. She appears well-developed and well-nourished. No distress.  HENT:  Head: Normocephalic and atraumatic.  Right Ear: External ear normal.  Left Ear: External ear normal.  Mouth/Throat: Oropharynx is clear and moist.  Eyes: Pupils are equal, round, and reactive to light.  Neck: Normal range of motion. Neck supple. No thyromegaly present.  Cardiovascular: Normal rate, regular rhythm, normal heart sounds and intact distal pulses.  No murmur heard. Pulmonary/Chest: Effort normal and breath sounds normal. No respiratory distress. She has no wheezes.  Abdominal: Soft. Bowel sounds are normal. She exhibits no distension. There is no tenderness.  Musculoskeletal: She exhibits edema (trace BLE). She exhibits no tenderness.  Unable to lift left arm greater than 45 degree  Neurological: She is alert and oriented to person, place, and time. She has normal reflexes. No cranial nerve deficit.  Skin: Skin is warm and dry.  Psychiatric: She has a normal mood and affect. Her behavior is normal. Judgment and thought content normal.  Vitals reviewed.    BP (!) 142/70   Pulse 70   Temp (!) 97.1 F (36.2 C) (Oral)   Ht _0  (1.676  m)   Wt 148 lb 12.8 oz (67.5 kg)   BMI 24.02 kg/m      Assessment & Plan:  Cassidy Barber was seen today for left arm pain.  Diagnoses and all orders for this visit:  Injury of left rotator cuff, subsequent encounter -     Ambulatory referral to Orthopedic Surgery  Need for prophylactic immunotherapy -     CMP14+EGFR -     CBC with Differential/Platelet -     Magnesium -     Tacrolimus Level -     Everolimus  Will do referral Ortho Continue naprosyn  Continue ROM exercises  Follow up with Ortho, will let them order scan Keep follow up with specialists    [name], FNP

## 2018-03-31 NOTE — Addendum Note (Signed)
Addended by: Shelbie Ammons on: 03/31/2018 03:10 PM   Modules accepted: Orders

## 2018-04-01 LAB — CMP14+EGFR
A/G RATIO: 1.5 (ref 1.2–2.2)
ALT: 13 IU/L (ref 0–32)
AST: 21 IU/L (ref 0–40)
Albumin: 3.5 g/dL — ABNORMAL LOW (ref 3.6–4.8)
Alkaline Phosphatase: 100 IU/L (ref 39–117)
BUN/Creatinine Ratio: 16 (ref 12–28)
BUN: 14 mg/dL (ref 8–27)
Bilirubin Total: 0.2 mg/dL (ref 0.0–1.2)
CALCIUM: 8.3 mg/dL — AB (ref 8.7–10.3)
CO2: 24 mmol/L (ref 20–29)
CREATININE: 0.86 mg/dL (ref 0.57–1.00)
Chloride: 103 mmol/L (ref 96–106)
GFR calc Af Amer: 81 mL/min/{1.73_m2} (ref >59)
GFR calc non Af Amer: 71 mL/min/{1.73_m2} (ref >59)
GLUCOSE: 88 mg/dL (ref 65–99)
Globulin, Total: 2.4 g/dL (ref 1.5–4.5)
POTASSIUM: 4.3 mmol/L (ref 3.5–5.2)
Sodium: 143 mmol/L (ref 134–144)
Total Protein: 5.9 g/dL — ABNORMAL LOW (ref 6.0–8.5)

## 2018-04-01 LAB — CBC WITH DIFFERENTIAL/PLATELET
Basophils Absolute: 0.1 10*3/uL (ref 0.0–0.2)
Basos: 2 %
EOS (ABSOLUTE): 0.4 10*3/uL (ref 0.0–0.4)
Eos: 7 %
HEMOGLOBIN: 8.5 g/dL — AB (ref 11.1–15.9)
Hematocrit: 28.4 % — ABNORMAL LOW (ref 34.0–46.6)
IMMATURE GRANS (ABS): 0 10*3/uL (ref 0.0–0.1)
IMMATURE GRANULOCYTES: 0 %
LYMPHS: 42 %
Lymphocytes Absolute: 2.4 10*3/uL (ref 0.7–3.1)
MCH: 23.3 pg — ABNORMAL LOW (ref 26.6–33.0)
MCHC: 29.9 g/dL — ABNORMAL LOW (ref 31.5–35.7)
MCV: 78 fL — AB (ref 79–97)
MONOCYTES: 14 %
Monocytes Absolute: 0.8 10*3/uL (ref 0.1–0.9)
NEUTROS PCT: 35 %
Neutrophils Absolute: 2 10*3/uL (ref 1.4–7.0)
PLATELETS: 364 10*3/uL (ref 150–450)
RBC: 3.65 x10E6/uL — ABNORMAL LOW (ref 3.77–5.28)
RDW: 15.8 % — ABNORMAL HIGH (ref 12.3–15.4)
WBC: 5.8 10*3/uL (ref 3.4–10.8)

## 2018-04-01 LAB — TACROLIMUS LEVEL: Tacrolimus Lvl: NOT DETECTED ng/mL (ref 2.0–20.0)

## 2018-04-01 LAB — EVEROLIMUS: EVEROLIMUS: 8.8 ng/mL — AB (ref 3.0–8.0)

## 2018-04-01 LAB — MAGNESIUM: MAGNESIUM: 2 mg/dL (ref 1.6–2.3)

## 2018-04-04 ENCOUNTER — Other Ambulatory Visit: Payer: Self-pay

## 2018-04-04 ENCOUNTER — Emergency Department (HOSPITAL_COMMUNITY)
Admission: EM | Admit: 2018-04-04 | Discharge: 2018-04-05 | Disposition: A | Discharge status: Other Acute Inpatient Hospital | Payer: Medicare Other | Attending: Emergency Medicine | Admitting: Emergency Medicine

## 2018-04-04 ENCOUNTER — Encounter (HOSPITAL_COMMUNITY): Payer: Self-pay | Admitting: Emergency Medicine

## 2018-04-04 ENCOUNTER — Emergency Department (HOSPITAL_COMMUNITY): Payer: Medicare Other

## 2018-04-04 DIAGNOSIS — Z7982 Long term (current) use of aspirin: Secondary | ICD-10-CM | POA: Insufficient documentation

## 2018-04-04 DIAGNOSIS — Z85038 Personal history of other malignant neoplasm of large intestine: Secondary | ICD-10-CM | POA: Insufficient documentation

## 2018-04-04 DIAGNOSIS — D649 Anemia, unspecified: Secondary | ICD-10-CM | POA: Diagnosis not present

## 2018-04-04 DIAGNOSIS — K469 Unspecified abdominal hernia without obstruction or gangrene: Secondary | ICD-10-CM | POA: Diagnosis not present

## 2018-04-04 DIAGNOSIS — D72829 Elevated white blood cell count, unspecified: Secondary | ICD-10-CM | POA: Diagnosis not present

## 2018-04-04 DIAGNOSIS — Z79899 Other long term (current) drug therapy: Secondary | ICD-10-CM | POA: Insufficient documentation

## 2018-04-04 DIAGNOSIS — K436 Other and unspecified ventral hernia with obstruction, without gangrene: Secondary | ICD-10-CM | POA: Diagnosis not present

## 2018-04-04 DIAGNOSIS — G43A0 Cyclical vomiting, not intractable: Secondary | ICD-10-CM | POA: Diagnosis not present

## 2018-04-04 DIAGNOSIS — E039 Hypothyroidism, unspecified: Secondary | ICD-10-CM | POA: Diagnosis not present

## 2018-04-04 DIAGNOSIS — J189 Pneumonia, unspecified organism: Secondary | ICD-10-CM | POA: Insufficient documentation

## 2018-04-04 DIAGNOSIS — I10 Essential (primary) hypertension: Secondary | ICD-10-CM | POA: Insufficient documentation

## 2018-04-04 DIAGNOSIS — Z944 Liver transplant status: Secondary | ICD-10-CM | POA: Diagnosis not present

## 2018-04-04 DIAGNOSIS — R109 Unspecified abdominal pain: Secondary | ICD-10-CM | POA: Diagnosis not present

## 2018-04-04 DIAGNOSIS — R112 Nausea with vomiting, unspecified: Secondary | ICD-10-CM | POA: Insufficient documentation

## 2018-04-04 DIAGNOSIS — R6883 Chills (without fever): Secondary | ICD-10-CM | POA: Insufficient documentation

## 2018-04-04 DIAGNOSIS — R61 Generalized hyperhidrosis: Secondary | ICD-10-CM | POA: Insufficient documentation

## 2018-04-04 DIAGNOSIS — R509 Fever, unspecified: Secondary | ICD-10-CM | POA: Diagnosis not present

## 2018-04-04 DIAGNOSIS — R1030 Lower abdominal pain, unspecified: Secondary | ICD-10-CM | POA: Diagnosis not present

## 2018-04-04 DIAGNOSIS — R1110 Vomiting, unspecified: Secondary | ICD-10-CM | POA: Diagnosis not present

## 2018-04-04 DIAGNOSIS — R918 Other nonspecific abnormal finding of lung field: Secondary | ICD-10-CM | POA: Diagnosis not present

## 2018-04-04 DIAGNOSIS — Z4682 Encounter for fitting and adjustment of non-vascular catheter: Secondary | ICD-10-CM | POA: Diagnosis not present

## 2018-04-04 LAB — COMPREHENSIVE METABOLIC PANEL
ALK PHOS: 103 U/L (ref 38–126)
ALT: 19 U/L (ref 14–54)
AST: 33 U/L (ref 15–41)
Albumin: 3.5 g/dL (ref 3.5–5.0)
Anion gap: 12 (ref 5–15)
BUN: 20 mg/dL (ref 6–20)
CALCIUM: 9 mg/dL (ref 8.9–10.3)
CHLORIDE: 97 mmol/L — AB (ref 101–111)
CO2: 29 mmol/L (ref 22–32)
CREATININE: 1.02 mg/dL — AB (ref 0.44–1.00)
GFR calc Af Amer: >60 mL/min (ref >60)
GFR calc non Af Amer: 56 mL/min — ABNORMAL LOW (ref >60)
GLUCOSE: 164 mg/dL — AB (ref 65–99)
Potassium: 3.4 mmol/L — ABNORMAL LOW (ref 3.5–5.1)
SODIUM: 138 mmol/L (ref 135–145)
Total Bilirubin: 0.7 mg/dL (ref 0.3–1.2)
Total Protein: 7.3 g/dL (ref 6.5–8.1)

## 2018-04-04 LAB — URINALYSIS, ROUTINE W REFLEX MICROSCOPIC
Bacteria, UA: NONE SEEN
Bilirubin Urine: NEGATIVE
Glucose, UA: NEGATIVE mg/dL
KETONES UR: 80 mg/dL — AB
Leukocytes, UA: NEGATIVE
Nitrite: NEGATIVE
Protein, ur: NEGATIVE mg/dL
pH: 7 (ref 5.0–8.0)

## 2018-04-04 LAB — CBC
HCT: 32.6 % — ABNORMAL LOW (ref 36.0–46.0)
Hemoglobin: 9.7 g/dL — ABNORMAL LOW (ref 12.0–15.0)
MCH: 23.8 pg — AB (ref 26.0–34.0)
MCHC: 29.8 g/dL — AB (ref 30.0–36.0)
MCV: 79.9 fL (ref 78.0–100.0)
PLATELETS: 415 10*3/uL — AB (ref 150–400)
RBC: 4.08 MIL/uL (ref 3.87–5.11)
RDW: 16.7 % — AB (ref 11.5–15.5)
WBC: 10.8 10*3/uL — ABNORMAL HIGH (ref 4.0–10.5)

## 2018-04-04 LAB — LIPASE, BLOOD: LIPASE: 21 U/L (ref 11–51)

## 2018-04-04 LAB — LACTIC ACID, PLASMA: Lactic Acid, Venous: 1.3 mmol/L (ref 0.5–1.9)

## 2018-04-04 MED ORDER — KCL IN DEXTROSE-NACL 40-5-0.45 MEQ/L-%-% IV SOLN
INTRAVENOUS | Status: DC
  Administered 2018-04-05: 100 mL/h via INTRAVENOUS
  Filled 2018-04-04 (×7): qty 1000

## 2018-04-04 MED ORDER — SODIUM CHLORIDE 0.9 % IV SOLN: INTRAVENOUS | Status: DC

## 2018-04-04 MED ORDER — SODIUM CHLORIDE 0.9 % IV SOLN
1.0000 g | Freq: Once | INTRAVENOUS | Status: AC
  Administered 2018-04-04: 1 g via INTRAVENOUS
  Filled 2018-04-04: qty 1

## 2018-04-04 MED ORDER — SODIUM CHLORIDE 0.9 % IV BOLUS
500.0000 mL | Freq: Once | INTRAVENOUS | Status: AC
  Administered 2018-04-04: 500 mL via INTRAVENOUS

## 2018-04-04 MED ORDER — HYDROMORPHONE HCL 1 MG/ML IJ SOLN
0.5000 mg | Freq: Once | INTRAMUSCULAR | Status: AC
  Administered 2018-04-04: 0.5 mg via INTRAVENOUS
  Filled 2018-04-04: qty 1

## 2018-04-04 MED ORDER — IOPAMIDOL (ISOVUE-300) INJECTION 61%
100.0000 mL | Freq: Once | INTRAVENOUS | Status: AC | PRN
  Administered 2018-04-04: 100 mL via INTRAVENOUS

## 2018-04-04 MED ORDER — ONDANSETRON 4 MG PO TBDP
4.0000 mg | ORAL_TABLET | Freq: Once | ORAL | Status: AC | PRN
  Administered 2018-04-04: 4 mg via ORAL
  Filled 2018-04-04: qty 1

## 2018-04-04 MED ORDER — SODIUM CHLORIDE 0.9 % IV BOLUS
1500.0000 mL | Freq: Once | INTRAVENOUS | Status: AC
  Administered 2018-04-04: 1000 mL via INTRAVENOUS

## 2018-04-04 MED ORDER — VANCOMYCIN HCL IN DEXTROSE 1-5 GM/200ML-% IV SOLN
1000.0000 mg | Freq: Once | INTRAVENOUS | Status: AC
  Administered 2018-04-04: 1000 mg via INTRAVENOUS
  Filled 2018-04-04: qty 200

## 2018-04-04 NOTE — Progress Notes (Signed)
Pharmacy Note:  Initial antibiotic(s) regimen of Vancomycin ordered by EDP to treat PNA.  Estimated Creatinine Clearance: 50.8 mL/min (A) (by C-G formula based on SCr of 1.02 mg/dL (H)).   Allergies  Allergen Reactions  . Ciprofloxacin Itching  . Codeine Nausea Only  . Oxycodone Nausea Only    Vitals:   04/04/18 2030 04/04/18 2100  BP: 103/84 (!) 123/94  Pulse: 96   Resp: 18   Temp:    SpO2: 100%     Anti-infectives (From admission, onward)   Start     Dose/Rate Route Frequency Ordered Stop   04/04/18 2130  ceFEPIme (MAXIPIME) 1 g in sodium chloride 0.9 % 100 mL IVPB     1 g 200 mL/hr over 30 Minutes Intravenous  Once 04/04/18 2115        Antimicrobials this admission:  Vanc 5/24 >>  Cefepime 5/24 >>   Dose adjustments this admission:  n/a   Microbiology results:   BCx:   UCx:    Sputum:    MRSA PCR:    Plan: Initial dose(s) of Vancomycin X 1 ordered. F/U admission orders for further dosing if therapy continued.  Pricilla Larsson, [facility] 04/04/2018 9:33 PM

## 2018-04-04 NOTE — ED Provider Notes (Signed)
[facility] EMERGENCY DEPARTMENT Provider Note   CSN: [id number] Arrival date & time: 04/04/18  1432     History   Chief Complaint Chief Complaint  Patient presents with  . Abdominal Pain    HPI Cassidy Barber is a 67 y.o. female.  Patient presents with onset of centralized abdominal pain a little bit lower quadrant area with onset at [place] yesterday while eating.  Patient had multiple episodes of nausea and vomiting approximately 20 times today alone.  No diarrhea.  Patient states last night she had fever and chills and sweats.  Patient's past medical history is significant for liver transplant done at [facility] in 2017.  Secondary to primary biliary cholangitis.  Patient's also had 2 bouts of colon cancer first one on the right side of the resection and got transverse colon cancer resection their work-up then showed that she had family genetic disorder called Lynch syndrome or disorder and therefore all of her colon was removed patient was extremely high risk for recurrent cancer.  Patient developed a ventral hernia as they were larger they had been made smaller still and knows that she has a small ventral hernia in the lower part of her abdomen.  Patient's GI doctor is Dr. Melony Overly.  He is followed her remnant of her colon following the subtotal colectomy.  Patient used to be followed entirely at [facility] but due to insurance changes she recently switched to follow the transplant service at [facility].  Is been seen by them since 2017.     Past Medical History:  Diagnosis Date  . Anemia of chronic disease 10/26/2013  . Hernia of unspecified site of abdominal cavity without mention of obstruction or gangrene   . Hypertension   . Liver disease   . Lynch syndrome   . Lynch syndrome   . Primary biliary cirrhosis ([place]) 10/26/2013  . Thyroid disease     Patient Active Problem List   Diagnosis Date Noted  . Chronic diarrhea 06/26/2017  . Iron deficiency anemia  06/26/2017  . GAD (generalized anxiety disorder) 08/28/2016  . Vitamin D deficiency 08/28/2016  . GERD (gastroesophageal reflux disease) 08/28/2016  . Insomnia 08/28/2016  . Convulsions ([place]) 11/30/2015  . Liver transplant recipient [facility]) 11/30/2015  . Seizures ([place]) 09/27/2015  . Essential hypertension, benign 03/10/2015  . Hx of liver transplant ([place]) 12/08/2014  . Diarrhea 12/08/2014  . Colon cancer ([place]) 12/08/2014  . Hepatic encephalopathy ([place]) 10/26/2013  . Anemia of chronic disease 10/26/2013  . Personal history of colonic polyps 12/27/2012  . PBC (primary biliary cirrhosis) 03/24/2012  . Hypothyroidism 03/24/2012  . Ventral hernia, recurrent 03/24/2012    Past Surgical History:  Procedure Laterality Date  . ABDOMINAL HERNIA REPAIR     Patient's states that she has had 8- 9 hernia surgeries  . ABDOMINAL HYSTERECTOMY    . CHOLECYSTECTOMY  2007  . COLON RESECTION    . COLON SURGERY  2008   Done at [facility]  . COLONOSCOPY     Done at [facility]  . FLEXIBLE SIGMOIDOSCOPY N/A 10/20/2015   Procedure: FLEXIBLE SIGMOIDOSCOPY;  Surgeon: Rogene Houston, MD;  Location: [facility] ENDO SUITE;  Service: Endoscopy;  Laterality: N/A;  56 - Dr Laural Golden has meeting until 1:00  . FLEXIBLE SIGMOIDOSCOPY N/A 07/11/2016   Procedure: FLEXIBLE SIGMOIDOSCOPY;  Surgeon: Rogene Houston, MD;  Location: [facility] ENDO SUITE;  Service: Endoscopy;  Laterality: N/A;  1200  . FLEXIBLE SIGMOIDOSCOPY N/A 08/09/2017   Procedure: FLEXIBLE SIGMOIDOSCOPY;  Surgeon: Rogene Houston, MD;  Location: [facility] ENDO SUITE;  Service: Endoscopy;  Laterality: N/A;  1:00  . HERNIA REPAIR    . LIVER TRANSPLANT  [id number]  . POLYPECTOMY  08/09/2017   Procedure: POLYPECTOMY;  Surgeon: Rogene Houston, MD;  Location: [facility] ENDO SUITE;  Service: Endoscopy;;  colon small bowel  . SPLENECTOMY  2006  . UPPER GASTROINTESTINAL ENDOSCOPY     Done at [facility]     OB History   None      Home Medications    Prior to Admission medications   Medication Sig  Start Date End Date Taking? Authorizing Provider  acetaminophen (TYLENOL) 325 MG tablet Take 650 mg by mouth every 6 (six) hours as needed for mild pain or moderate pain.     [provider]  alendronate (FOSAMAX) 70 MG tablet Take 1 tablet (70 mg total) by mouth every 7 (seven) days. Take with a full glass of water on an empty stomach. 12/12/17 12/12/18  [name], FNP  ALPRAZolam Duanne Moron) 0.5 MG tablet Take 1 tablet (0.5 mg total) by mouth at bedtime as needed. 11/21/17   [name] A, FNP  aspirin EC 81 MG tablet Take 81 mg by mouth daily.    [provider]  CALCIUM CITRATE PO Take 1,200 mg by mouth.    [provider]  Cholecalciferol (VITAMIN D3) 2000 UNITS capsule Take 1 capsule by mouth daily.    [provider]  Cyanocobalamin (VITAMIN B 12 PO) Take 1,000 mg by mouth daily.    [provider]  cyclobenzaprine (FLEXERIL) 5 MG tablet Take 1 tablet (5 mg total) by mouth 3 (three) times daily as needed for muscle spasms. 09/12/17   [name], FNP  ferrous sulfate 325 (65 FE) MG tablet Take 1 tablet (325 mg total) by mouth daily with breakfast. 12/31/17   Rehman, Mechele Dawley, MD  fluticasone (FLONASE) 50 MCG/ACT nasal spray Place 2 sprays into both nostrils daily. 12/25/17   Terald Sleeper, PA-C  hydrOXYzine (VISTARIL) 25 MG capsule Take 25 mg by mouth 3 (three) times daily as needed.     [provider]  levothyroxine (SYNTHROID, LEVOTHROID) 150 MCG tablet Take 1 tablet (150 mcg total) by mouth daily. 01/01/18   Dettinger, Fransisca Kaufmann, MD  loperamide (IMODIUM) 2 MG capsule TAKE 2 CAPSULES 4 TIMES DAILY AS NEEDED 04/12/17   [name] A, FNP  losartan (COZAAR) 25 MG tablet TAKE 1 TABLET BY MOUTH ONCE DAILY 01/01/18   [name] A, FNP  magnesium oxide (MAG-OX) 400 MG tablet Take 400 mg by mouth daily.    [provider]  metoprolol succinate (TOPROL-XL) 25 MG 24 hr tablet TAKE 1 TABLET BY MOUTH ONCE DAILY 12/13/17   [name] A, FNP  Multiple Vitamins-Minerals (HAIR SKIN AND NAILS FORMULA PO) Take by mouth 2 (two) times daily.    [provider]  Multiple Vitamins-Minerals (WOMENS 50+ MULTI VITAMIN/MIN PO) Take by mouth daily.    [provider]  naproxen (NAPROSYN) 500 MG tablet Take 1 tablet (500 mg total) by mouth 2 (two) times daily with a meal. 01/27/18   Hawks, Alyse Low A, FNP  pantoprazole (PROTONIX) 40 MG tablet Take 1 tablet (40 mg total) by mouth daily before breakfast. 03/10/18   [name], FNP  propranolol (INDERAL) 80 MG tablet Take by mouth.    [provider]  tacrolimus (PROGRAF) 1 MG capsule Take 1-2 mg by mouth. Patient is taking 2  in the morning and 1 at night.    [provider]  ursodiol (ACTIGALL) 250 MG tablet Take 250 mg by mouth 2 (two) times daily.    [provider]  Vitamin D, Ergocalciferol, (DRISDOL) 50000 units CAPS capsule Take 1 capsule (50,000 Units total) by mouth every 7 (seven) days. Patient taking differently: Take 50,000 Units by mouth every 7 (seven) days. thursday 06/07/17   [name], FNP  vitamin E (VITAMIN E) 400 UNIT capsule Take 400 Units by mouth daily.    [provider]  ZORTRESS 0.5 MG TABS  03/10/18   [provider]    Family History Family History  Problem Relation Age of Onset  . Prostate cancer Father   . Colon cancer Father   . Colon cancer Sister   . Lung cancer Sister   . Healthy Son   . Alcohol abuse Brother     Social History Social History   Tobacco Use  . Smoking status: Never Smoker  . Smokeless tobacco: Never Used  Substance Use Topics  . Alcohol use: No    Alcohol/week: 0.0 oz  . Drug use: No     Allergies   Ciprofloxacin; Codeine; and Oxycodone   Review of Systems Review of Systems  Constitutional: Positive for chills and fever.  HENT: Negative for congestion and sore throat.   Eyes: Negative for visual disturbance.  Respiratory: Negative for  shortness of breath.   Cardiovascular: Negative for chest pain.  Gastrointestinal: Positive for abdominal pain, nausea and vomiting.  Genitourinary: Negative for dysuria.  Musculoskeletal: Negative for back pain.  Skin: Negative for rash.  Allergic/Immunologic: Positive for immunocompromised state.  Neurological: Negative for headaches.  Hematological: Does not bruise/bleed easily.  Psychiatric/Behavioral: Negative for confusion.     Physical Exam Updated Vital Signs BP (!) 123/94   Pulse 96   Temp 100 F (37.8 C) (Oral)   Resp 18   Ht 1.676 m (5\' 6" )   Wt 67.1 kg (148 lb)   SpO2 100%   BMI 23.89 kg/m   Physical Exam  Constitutional: She is oriented to person, place, and time. She appears well-developed and well-nourished. She appears distressed.  HENT:  Mucous membranes dry.  Eyes: Pupils are equal, round, and reactive to light. Conjunctivae and EOM are normal.  Neck: Neck supple.  Cardiovascular: Normal rate and regular rhythm.  Pulmonary/Chest: Effort normal and breath sounds normal. No respiratory distress. She has no wheezes. She has no rales.  Abdominal: Soft. Bowel sounds are normal. She exhibits mass. There is tenderness.  Palpable mass tender in nature measuring 3 to 4 cm at the lower part of her ventral midline incision.  Mild diffuse tenderness throughout the abdomen.  Musculoskeletal: Normal range of motion. She exhibits no edema.  Neurological: She is alert and oriented to person, place, and time. No sensory deficit. She exhibits normal muscle tone. Coordination normal.  Skin: Skin is warm.  Nursing note and vitals reviewed.    ED Treatments / Results  Labs (all labs ordered are listed, but only abnormal results are displayed) Labs Reviewed  COMPREHENSIVE METABOLIC PANEL - Abnormal; Notable for the following components:      Result Value   Potassium 3.4 (*)    Chloride 97 (*)    Glucose, Bld 164 (*)    Creatinine, Ser 1.02 (*)    GFR calc non Af Amer  56 (*)    All other components within normal limits  CBC - Abnormal; Notable for the  following components:   WBC 10.8 (*)    Hemoglobin 9.7 (*)    HCT 32.6 (*)    MCH 23.8 (*)    MCHC 29.8 (*)    RDW 16.7 (*)    Platelets 415 (*)    All other components within normal limits  CULTURE, BLOOD (ROUTINE X 2)  CULTURE, BLOOD (ROUTINE X 2)  LIPASE, BLOOD  URINALYSIS, ROUTINE W REFLEX MICROSCOPIC  LACTIC ACID, PLASMA    EKG None  Radiology Ct Chest Wo Contrast  Result Date: 04/04/2018 CLINICAL DATA:  Abdominal pain and vomiting since last evening. Abnormality noted in the right middle lobe on earlier CT. EXAM: CT CHEST WITHOUT CONTRAST TECHNIQUE: Multidetector CT imaging of the chest was performed following the standard protocol without IV contrast. COMPARISON:  Same day CT abdomen and pelvis FINDINGS: Cardiovascular: Conventional branch pattern of the great vessels with atherosclerotic calcifications in the proximal right brachiocephalic and origin of the left subclavian arteries. Nonaneurysmal atherosclerotic aorta. The unenhanced pulmonary arterial system is unremarkable. Cardiomegaly without pericardial effusion. Coronary arteriosclerosis identified along the left circumflex and LAD. Mediastinum/Nodes: Fluid-filled esophagus possibly from reflux. No lymphadenopathy. Patent trachea and mainstem bronchi. Lungs/Pleura: Mix of ground-glass and patchy alveolar consolidations in the right middle lobe medial segment suspicious for aspiration pneumonia given fluid distention of the esophagus and history of vomiting. Subpleural atelectasis is noted at each lung base. No dominant mass, effusion or pneumothorax. Upper Abdomen: Postop change of the transplanted liver. Splenectomy with small splenules. Partially included dilated fluid-filled small bowel noted. The stomach is moderately distended with fluid. Musculoskeletal: No chest wall mass or suspicious bone lesions identified. IMPRESSION: 1. Mixed  ground-glass and alveolar opacities in the medial segment of right middle lobe. This in conjunction with history of vomiting and fluid/debris within the visualized esophagus may be secondary to aspiration. Follow-up to ensure clearance is suggested. 2. Coronary arteriosclerosis. 3. Mild aortic atherosclerosis. Aortic Atherosclerosis (ICD10-I70.0). Electronically Signed   By: Ashley Royalty M.D.   On: 04/04/2018 21:04   Ct Abdomen Pelvis W Contrast  Result Date: 04/04/2018 CLINICAL DATA:  Abdominal pain lower an umbilical with vomiting EXAM: CT ABDOMEN AND PELVIS WITH CONTRAST TECHNIQUE: Multidetector CT imaging of the abdomen and pelvis was performed using the standard protocol following bolus administration of intravenous contrast. CONTRAST:  178mL ISOVUE-300 IOPAMIDOL (ISOVUE-300) INJECTION 61% COMPARISON:  CT 09/21/2016, 10/04/2014 FINDINGS: Lower chest: Lung bases demonstrate ground-glass density in the right middle lobe with mild nodularity. No pleural effusion. Heart size within normal limits. Air distention of small hiatal hernia Hepatobiliary: History of liver transplant. No focal hepatic abnormality. Status post cholecystectomy. No biliary dilatation. Pancreas: Atrophic pancreas.  No inflammatory changes. Spleen: Status post splenectomy. Splenule in the left upper quadrant, no change. Adrenals/Urinary Tract: Adrenal glands are within normal limits. No hydronephrosis. Cyst midpole left kidney. Possible cystocele. Stomach/Bowel: Mild fluid enlargement of the stomach. Dilated duodenum, jejunal small bowel loops and proximal ileum consistent with bowel obstruction. Transition point at a mid to lower abdominal ventral hernia were fascial defect measures approximately 2.6 cm transverse. Fluid and soft tissue stranding in the hernia sac. Bowel distal to this is decompressed. Subtotal colectomy. No colon wall thickening. Extensive postsurgical changes of the colon and small bowel. Vascular/Lymphatic: Moderate  aortic atherosclerosis. No aneurysmal dilatation. Subcentimeter retroperitoneal nodes. Reproductive: Status post hysterectomy. No adnexal masses. Other: Negative for free air or free fluid. Broad base midline and right abdominal ventral hernia containing fat and small bowel. Musculoskeletal: Chronic pars defect at L5 with  9 mm anterolisthesis L5 on S1. IMPRESSION: 1. Small bowel obstruction with transition point at a mid to lower ventral hernia. Moderate fluid and edema surrounding the bowel within the hernia sac suspicious for incarcerated hernia. 2. Subtotal colectomy with extensive postsurgical changes of the bowel 3. Status post liver transplant. Splenectomy with stable splenule in the left upper quadrant 4. Ground-glass density and nodularity in the right middle lobe suspicious for respiratory infection/pneumonia. Electronically Signed   By: Donavan Foil M.D.   On: 04/04/2018 19:49    Procedures Procedures (including critical care time)  CRITICAL CARE Performed by: Fredia Sorrow Total critical care time: 90 minutes Critical care time was exclusive of separately billable procedures and treating other patients. Critical care was necessary to treat or prevent imminent or life-threatening deterioration. Critical care was time spent personally by me on the following activities: development of treatment plan with patient and/or surrogate as well as nursing, discussions with consultants, evaluation of patient's response to treatment, examination of patient, obtaining history from patient or surrogate, ordering and performing treatments and interventions, ordering and review of laboratory studies, ordering and review of radiographic studies, pulse oximetry and re-evaluation of patient's condition.   Medications Ordered in ED Medications  0.9 %  sodium chloride infusion (has no administration in time range)  vancomycin (VANCOCIN) IVPB 1000 mg/200 mL premix (has no administration in time range)    sodium chloride 0.9 % bolus 1,500 mL (has no administration in time range)  dextrose 5 % and 0.45 % NaCl with KCl 40 mEq/L infusion (has no administration in time range)  ondansetron (ZOFRAN-ODT) disintegrating tablet 4 mg (4 mg Oral Given 04/04/18 1449)  sodium chloride 0.9 % bolus 500 mL (0 mLs Intravenous Stopped 04/04/18 2148)  HYDROmorphone (DILAUDID) injection 0.5 mg (0.5 mg Intravenous Given 04/04/18 1948)  iopamidol (ISOVUE-300) 61 % injection 100 mL (100 mLs Intravenous Contrast Given 04/04/18 1922)  ceFEPIme (MAXIPIME) 1 g in sodium chloride 0.9 % 100 mL IVPB (1 g Intravenous New Bag/Given 04/04/18 2147)     Initial Impression / Assessment and Plan / ED Course  I have reviewed the triage vital signs and the nursing notes.  Pertinent labs & imaging results that were available during my care of the patient were reviewed by me and considered in my medical decision making (see chart for details).    Patient status post liver transplant still on immunosuppressive therapy in 2015.  Patient presents tonight with bowel obstruction probably secondary to incarcerated ventral hernia.  Complicated by right middle lobe pneumonia.  Patient started on broad-spectrum antibiotics for immunosuppressed patient with probable healthcare acquired pneumonia.  Patient had blood cultures done.  Patient received 2 L of normal saline fluid bolus started on IV drip with D5 half-normal saline without potassium in it.  NG tube started.  Patient requiring 2 L of oxygen for some mild hypoxia with sats going down into the 80s.  Patient's vital signs are without any significant abnormalities.  Not hypotensive not tachycardic.  Discussed with transplant service at [facility] where patient is followed they have accepted her she will go from here to the emergency department there will be evaluated by the transplant service.  Patient will be transported by [facility] critical care transport service.  EMTALA  completed.    Patient's pain controlled here with 1 mg of hydromorphone.  Patient may require supplemental pain medication prior to transfer.  Accepting surgeon is Dr. Cain Saupe    Final Clinical Impressions(s) / ED Diagnoses   Final diagnoses:  Liver transplanted ([place])  HCAP (healthcare-associated pneumonia)  Ventral hernia with bowel obstruction    ED Discharge Orders    None       Fredia Sorrow, MD 04/04/18 [phone]

## 2018-04-04 NOTE — ED Notes (Signed)
Pt O2 sat in upper 80's, placed on [place] 2L

## 2018-04-04 NOTE — ED Triage Notes (Signed)
Patient complaining of abdominal pain with vomiting since last night.

## 2018-04-05 ENCOUNTER — Emergency Department (HOSPITAL_COMMUNITY): Payer: Medicare Other

## 2018-04-05 DIAGNOSIS — Z85038 Personal history of other malignant neoplasm of large intestine: Secondary | ICD-10-CM | POA: Diagnosis not present

## 2018-04-05 DIAGNOSIS — D72829 Elevated white blood cell count, unspecified: Secondary | ICD-10-CM | POA: Diagnosis not present

## 2018-04-05 DIAGNOSIS — K436 Other and unspecified ventral hernia with obstruction, without gangrene: Secondary | ICD-10-CM | POA: Diagnosis not present

## 2018-04-05 DIAGNOSIS — R6883 Chills (without fever): Secondary | ICD-10-CM | POA: Diagnosis not present

## 2018-04-05 DIAGNOSIS — R112 Nausea with vomiting, unspecified: Secondary | ICD-10-CM | POA: Diagnosis not present

## 2018-04-05 DIAGNOSIS — Z944 Liver transplant status: Secondary | ICD-10-CM | POA: Diagnosis not present

## 2018-04-05 DIAGNOSIS — R109 Unspecified abdominal pain: Secondary | ICD-10-CM | POA: Diagnosis not present

## 2018-04-05 DIAGNOSIS — R61 Generalized hyperhidrosis: Secondary | ICD-10-CM | POA: Diagnosis not present

## 2018-04-05 DIAGNOSIS — D899 Disorder involving the immune mechanism, unspecified: Secondary | ICD-10-CM | POA: Diagnosis not present

## 2018-04-05 DIAGNOSIS — J189 Pneumonia, unspecified organism: Secondary | ICD-10-CM | POA: Diagnosis not present

## 2018-04-05 DIAGNOSIS — Z85828 Personal history of other malignant neoplasm of skin: Secondary | ICD-10-CM | POA: Diagnosis not present

## 2018-04-05 DIAGNOSIS — K469 Unspecified abdominal hernia without obstruction or gangrene: Secondary | ICD-10-CM | POA: Diagnosis not present

## 2018-04-05 DIAGNOSIS — Z1509 Genetic susceptibility to other malignant neoplasm: Secondary | ICD-10-CM | POA: Diagnosis not present

## 2018-04-05 DIAGNOSIS — E039 Hypothyroidism, unspecified: Secondary | ICD-10-CM | POA: Diagnosis not present

## 2018-04-05 DIAGNOSIS — Z4682 Encounter for fitting and adjustment of non-vascular catheter: Secondary | ICD-10-CM | POA: Diagnosis not present

## 2018-04-05 DIAGNOSIS — Z7983 Long term (current) use of bisphosphonates: Secondary | ICD-10-CM | POA: Diagnosis not present

## 2018-04-05 DIAGNOSIS — D649 Anemia, unspecified: Secondary | ICD-10-CM | POA: Diagnosis not present

## 2018-04-05 DIAGNOSIS — Z9049 Acquired absence of other specified parts of digestive tract: Secondary | ICD-10-CM | POA: Diagnosis not present

## 2018-04-05 DIAGNOSIS — K430 Incisional hernia with obstruction, without gangrene: Secondary | ICD-10-CM | POA: Diagnosis not present

## 2018-04-05 DIAGNOSIS — K56609 Unspecified intestinal obstruction, unspecified as to partial versus complete obstruction: Secondary | ICD-10-CM | POA: Diagnosis not present

## 2018-04-05 DIAGNOSIS — R509 Fever, unspecified: Secondary | ICD-10-CM | POA: Diagnosis not present

## 2018-04-05 DIAGNOSIS — R918 Other nonspecific abnormal finding of lung field: Secondary | ICD-10-CM | POA: Diagnosis not present

## 2018-04-05 DIAGNOSIS — Z79899 Other long term (current) drug therapy: Secondary | ICD-10-CM | POA: Diagnosis not present

## 2018-04-05 DIAGNOSIS — I10 Essential (primary) hypertension: Secondary | ICD-10-CM | POA: Diagnosis not present

## 2018-04-05 DIAGNOSIS — Z7982 Long term (current) use of aspirin: Secondary | ICD-10-CM | POA: Diagnosis not present

## 2018-04-05 MED ORDER — HYDROMORPHONE HCL 1 MG/ML IJ SOLN
0.5000 mg | Freq: Once | INTRAMUSCULAR | Status: AC
  Administered 2018-04-05: 0.5 mg via INTRAVENOUS
  Filled 2018-04-05: qty 1

## 2018-04-05 NOTE — ED Notes (Signed)
NG tube placed, placement verification orders for xray put in

## 2018-04-05 NOTE — ED Notes (Signed)
Pt left with [facility] ground transport reporting no pain, 136mL of dex/KCL infused until left hand IV infiltrated. IV removed by Etta Quill travel nurse.

## 2018-04-06 DIAGNOSIS — D899 Disorder involving the immune mechanism, unspecified: Secondary | ICD-10-CM | POA: Diagnosis not present

## 2018-04-06 DIAGNOSIS — K436 Other and unspecified ventral hernia with obstruction, without gangrene: Secondary | ICD-10-CM | POA: Diagnosis not present

## 2018-04-06 DIAGNOSIS — Z9049 Acquired absence of other specified parts of digestive tract: Secondary | ICD-10-CM | POA: Diagnosis not present

## 2018-04-06 DIAGNOSIS — Z944 Liver transplant status: Secondary | ICD-10-CM | POA: Diagnosis not present

## 2018-04-07 DIAGNOSIS — Z9049 Acquired absence of other specified parts of digestive tract: Secondary | ICD-10-CM | POA: Diagnosis not present

## 2018-04-07 DIAGNOSIS — Z944 Liver transplant status: Secondary | ICD-10-CM | POA: Diagnosis not present

## 2018-04-07 DIAGNOSIS — D899 Disorder involving the immune mechanism, unspecified: Secondary | ICD-10-CM | POA: Diagnosis not present

## 2018-04-07 DIAGNOSIS — Z1509 Genetic susceptibility to other malignant neoplasm: Secondary | ICD-10-CM | POA: Diagnosis not present

## 2018-04-07 DIAGNOSIS — K436 Other and unspecified ventral hernia with obstruction, without gangrene: Secondary | ICD-10-CM | POA: Diagnosis not present

## 2018-04-08 DIAGNOSIS — I10 Essential (primary) hypertension: Secondary | ICD-10-CM | POA: Insufficient documentation

## 2018-04-08 DIAGNOSIS — D899 Disorder involving the immune mechanism, unspecified: Secondary | ICD-10-CM | POA: Diagnosis not present

## 2018-04-08 DIAGNOSIS — K436 Other and unspecified ventral hernia with obstruction, without gangrene: Secondary | ICD-10-CM | POA: Diagnosis not present

## 2018-04-08 DIAGNOSIS — Z944 Liver transplant status: Secondary | ICD-10-CM | POA: Diagnosis not present

## 2018-04-09 DIAGNOSIS — D899 Disorder involving the immune mechanism, unspecified: Secondary | ICD-10-CM | POA: Diagnosis not present

## 2018-04-09 DIAGNOSIS — K436 Other and unspecified ventral hernia with obstruction, without gangrene: Secondary | ICD-10-CM | POA: Diagnosis not present

## 2018-04-09 DIAGNOSIS — Z944 Liver transplant status: Secondary | ICD-10-CM | POA: Diagnosis not present

## 2018-04-09 LAB — CULTURE, BLOOD (ROUTINE X 2)
Culture: NO GROWTH
Culture: NO GROWTH
SPECIAL REQUESTS: ADEQUATE

## 2018-04-09 MED ORDER — LEVOTHYROXINE SODIUM 75 MCG PO TABS
150.00 | ORAL_TABLET | ORAL | Status: DC
Start: 2018-04-10 — End: 2018-04-09

## 2018-04-09 MED ORDER — CEPHALEXIN 500 MG PO CAPS
500.00 | ORAL_CAPSULE | ORAL | Status: DC
Start: 2018-04-09 — End: 2018-04-09

## 2018-04-09 MED ORDER — MELATONIN 3 MG PO TABS
3.00 | ORAL_TABLET | ORAL | Status: DC
Start: 2018-04-09 — End: 2018-04-09

## 2018-04-09 MED ORDER — ACETAMINOPHEN 325 MG PO TABS
975.00 | ORAL_TABLET | ORAL | Status: DC
Start: 2018-04-09 — End: 2018-04-09

## 2018-04-09 MED ORDER — DIPHENHYDRAMINE HCL 25 MG PO CAPS
25.00 | ORAL_CAPSULE | ORAL | Status: DC
Start: ? — End: 2018-04-09

## 2018-04-09 MED ORDER — HEPARIN SODIUM (PORCINE) 5000 UNIT/ML IJ SOLN
5000.00 | INTRAMUSCULAR | Status: DC
Start: 2018-04-09 — End: 2018-04-09

## 2018-04-09 MED ORDER — ALPRAZOLAM 0.5 MG PO TABS
.50 | ORAL_TABLET | ORAL | Status: DC
Start: 2018-04-09 — End: 2018-04-09

## 2018-04-09 MED ORDER — LIDOCAINE HCL 1 % IJ SOLN
0.50 | INTRAMUSCULAR | Status: DC
Start: ? — End: 2018-04-09

## 2018-04-09 MED ORDER — TACROLIMUS 1 MG PO CAPS
2.00 | ORAL_CAPSULE | ORAL | Status: DC
Start: 2018-04-09 — End: 2018-04-09

## 2018-04-09 MED ORDER — HYDROMORPHONE HCL 2 MG PO TABS
1.00 | ORAL_TABLET | ORAL | Status: DC
Start: ? — End: 2018-04-09

## 2018-04-09 MED ORDER — METOPROLOL SUCCINATE ER 25 MG PO TB24
25.00 | ORAL_TABLET | ORAL | Status: DC
Start: 2018-04-10 — End: 2018-04-09

## 2018-04-10 ENCOUNTER — Telehealth: Payer: Self-pay | Admitting: Family

## 2018-04-11 ENCOUNTER — Other Ambulatory Visit: Payer: Medicare Other

## 2018-04-11 ENCOUNTER — Ambulatory Visit: Payer: Medicare Other | Admitting: *Deleted

## 2018-04-11 VITALS — BP 146/78 | HR 72

## 2018-04-11 DIAGNOSIS — Z0130 Encounter for examination of blood pressure without abnormal findings: Secondary | ICD-10-CM

## 2018-04-11 DIAGNOSIS — Z298 Encounter for other specified prophylactic measures: Secondary | ICD-10-CM | POA: Diagnosis not present

## 2018-04-11 NOTE — Progress Notes (Signed)
bp ck BP  146 78 P 72

## 2018-04-14 ENCOUNTER — Other Ambulatory Visit: Payer: Medicare Other

## 2018-04-14 DIAGNOSIS — Z298 Encounter for other specified prophylactic measures: Secondary | ICD-10-CM | POA: Diagnosis not present

## 2018-04-14 LAB — CBC WITH DIFFERENTIAL/PLATELET
BASOS ABS: 0.2 10*3/uL (ref 0.0–0.2)
Basos: 2 %
EOS (ABSOLUTE): 0.4 10*3/uL (ref 0.0–0.4)
EOS: 6 %
HEMOGLOBIN: 10.2 g/dL — AB (ref 11.1–15.9)
Hematocrit: 34.4 % (ref 34.0–46.6)
IMMATURE GRANULOCYTES: 2 %
Immature Grans (Abs): 0.1 10*3/uL (ref 0.0–0.1)
LYMPHS ABS: 2.9 10*3/uL (ref 0.7–3.1)
Lymphs: 41 %
MCH: 24.3 pg — AB (ref 26.6–33.0)
MCHC: 29.7 g/dL — ABNORMAL LOW (ref 31.5–35.7)
MCV: 82 fL (ref 79–97)
MONOS ABS: 0.4 10*3/uL (ref 0.1–0.9)
Monocytes: 5 %
NEUTROS PCT: 44 %
Neutrophils Absolute: 3.2 10*3/uL (ref 1.4–7.0)
PLATELETS: 546 10*3/uL — AB (ref 150–450)
RBC: 4.2 x10E6/uL (ref 3.77–5.28)
RDW: 17.7 % — AB (ref 12.3–15.4)
WBC: 7 10*3/uL (ref 3.4–10.8)

## 2018-04-14 LAB — CMP14+EGFR
ALK PHOS: 230 IU/L — AB (ref 39–117)
ALT: 16 IU/L (ref 0–32)
AST: 22 IU/L (ref 0–40)
Albumin/Globulin Ratio: 1 — ABNORMAL LOW (ref 1.2–2.2)
Albumin: 3 g/dL — ABNORMAL LOW (ref 3.6–4.8)
BILIRUBIN TOTAL: 0.2 mg/dL (ref 0.0–1.2)
BUN/Creatinine Ratio: 10 — ABNORMAL LOW (ref 12–28)
BUN: 7 mg/dL — ABNORMAL LOW (ref 8–27)
CHLORIDE: 106 mmol/L (ref 96–106)
CO2: 22 mmol/L (ref 20–29)
Calcium: 8.8 mg/dL (ref 8.7–10.3)
Creatinine, Ser: 0.72 mg/dL (ref 0.57–1.00)
GFR calc Af Amer: 101 mL/min/{1.73_m2} (ref >59)
GFR calc non Af Amer: 88 mL/min/{1.73_m2} (ref >59)
GLOBULIN, TOTAL: 2.9 g/dL (ref 1.5–4.5)
GLUCOSE: 95 mg/dL (ref 65–99)
Potassium: 4.3 mmol/L (ref 3.5–5.2)
SODIUM: 144 mmol/L (ref 134–144)
Total Protein: 5.9 g/dL — ABNORMAL LOW (ref 6.0–8.5)

## 2018-04-14 LAB — TACROLIMUS LEVEL: Tacrolimus Lvl: 6 ng/mL (ref 2.0–20.0)

## 2018-04-15 LAB — CBC WITH DIFFERENTIAL/PLATELET
Basophils Absolute: 0.1 10*3/uL (ref 0.0–0.2)
Basos: 1 %
EOS (ABSOLUTE): 0.2 10*3/uL (ref 0.0–0.4)
EOS: 3 %
HEMOGLOBIN: 9.2 g/dL — AB (ref 11.1–15.9)
Hematocrit: 30.3 % — ABNORMAL LOW (ref 34.0–46.6)
IMMATURE GRANULOCYTES: 0 %
Immature Grans (Abs): 0 10*3/uL (ref 0.0–0.1)
LYMPHS ABS: 1.6 10*3/uL (ref 0.7–3.1)
Lymphs: 23 %
MCH: 24.1 pg — ABNORMAL LOW (ref 26.6–33.0)
MCHC: 30.4 g/dL — AB (ref 31.5–35.7)
MCV: 80 fL (ref 79–97)
MONOS ABS: 1.3 10*3/uL — AB (ref 0.1–0.9)
Monocytes: 19 %
NEUTROS PCT: 54 %
Neutrophils Absolute: 3.7 10*3/uL (ref 1.4–7.0)
Platelets: 764 10*3/uL — ABNORMAL HIGH (ref 150–450)
RBC: 3.81 x10E6/uL (ref 3.77–5.28)
RDW: 18.6 % — AB (ref 12.3–15.4)
WBC: 6.9 10*3/uL (ref 3.4–10.8)

## 2018-04-15 LAB — CMP14+EGFR
A/G RATIO: 1.2 (ref 1.2–2.2)
ALBUMIN: 2.9 g/dL — AB (ref 3.6–4.8)
ALK PHOS: 170 IU/L — AB (ref 39–117)
ALT: 7 IU/L (ref 0–32)
AST: 13 IU/L (ref 0–40)
BUN / CREAT RATIO: 10 — AB (ref 12–28)
BUN: 8 mg/dL (ref 8–27)
CHLORIDE: 103 mmol/L (ref 96–106)
CO2: 24 mmol/L (ref 20–29)
Calcium: 8.5 mg/dL — ABNORMAL LOW (ref 8.7–10.3)
Creatinine, Ser: 0.83 mg/dL (ref 0.57–1.00)
GFR calc non Af Amer: 74 mL/min/{1.73_m2} (ref >59)
GFR, EST AFRICAN AMERICAN: 85 mL/min/{1.73_m2} (ref >59)
GLUCOSE: 87 mg/dL (ref 65–99)
Globulin, Total: 2.5 g/dL (ref 1.5–4.5)
Potassium: 4.7 mmol/L (ref 3.5–5.2)
Sodium: 141 mmol/L (ref 134–144)
TOTAL PROTEIN: 5.4 g/dL — AB (ref 6.0–8.5)

## 2018-04-15 LAB — TACROLIMUS LEVEL: TACROLIMUS LVL: 2.2 ng/mL (ref 2.0–20.0)

## 2018-04-16 DIAGNOSIS — Z944 Liver transplant status: Secondary | ICD-10-CM | POA: Diagnosis not present

## 2018-04-16 DIAGNOSIS — M19112 Post-traumatic osteoarthritis, left shoulder: Secondary | ICD-10-CM | POA: Diagnosis not present

## 2018-04-18 DIAGNOSIS — D899 Disorder involving the immune mechanism, unspecified: Secondary | ICD-10-CM | POA: Diagnosis not present

## 2018-04-18 DIAGNOSIS — K450 Other specified abdominal hernia with obstruction, without gangrene: Secondary | ICD-10-CM | POA: Diagnosis not present

## 2018-04-18 DIAGNOSIS — Z944 Liver transplant status: Secondary | ICD-10-CM | POA: Diagnosis not present

## 2018-04-18 DIAGNOSIS — D649 Anemia, unspecified: Secondary | ICD-10-CM | POA: Diagnosis not present

## 2018-04-21 ENCOUNTER — Encounter: Payer: Self-pay | Admitting: Family

## 2018-04-21 ENCOUNTER — Encounter (INDEPENDENT_AMBULATORY_CARE_PROVIDER_SITE_OTHER): Payer: Self-pay | Admitting: Internal Medicine

## 2018-04-22 ENCOUNTER — Other Ambulatory Visit: Payer: Self-pay | Admitting: Physician Assistant

## 2018-04-22 ENCOUNTER — Telehealth: Payer: Self-pay | Admitting: Family

## 2018-04-22 DIAGNOSIS — D045 Carcinoma in situ of skin of trunk: Secondary | ICD-10-CM | POA: Diagnosis not present

## 2018-04-22 DIAGNOSIS — C44529 Squamous cell carcinoma of skin of other part of trunk: Secondary | ICD-10-CM | POA: Diagnosis not present

## 2018-04-22 DIAGNOSIS — C44629 Squamous cell carcinoma of skin of left upper limb, including shoulder: Secondary | ICD-10-CM | POA: Diagnosis not present

## 2018-04-28 ENCOUNTER — Other Ambulatory Visit: Payer: Medicare Other

## 2018-04-28 ENCOUNTER — Other Ambulatory Visit: Payer: Self-pay

## 2018-04-28 ENCOUNTER — Encounter: Payer: Self-pay | Admitting: Physical Therapy

## 2018-04-28 ENCOUNTER — Ambulatory Visit: Payer: Medicare Other | Attending: Orthopedic Surgery | Admitting: Physical Therapy

## 2018-04-28 DIAGNOSIS — Z298 Encounter for other specified prophylactic measures: Secondary | ICD-10-CM | POA: Diagnosis not present

## 2018-04-28 DIAGNOSIS — M25512 Pain in left shoulder: Secondary | ICD-10-CM | POA: Diagnosis not present

## 2018-04-28 DIAGNOSIS — M6281 Muscle weakness (generalized): Secondary | ICD-10-CM | POA: Diagnosis not present

## 2018-04-28 DIAGNOSIS — G8929 Other chronic pain: Secondary | ICD-10-CM | POA: Diagnosis not present

## 2018-04-28 NOTE — Therapy (Signed)
[facility] [place], [place], [address] Phone: [phone]   Fax:  [phone]  Physical Therapy Evaluation  Patient Details  Name: Cassidy Barber MRN: [id number] Date of Birth: 02/12/1951 Referring Provider: Justice Britain MD   Encounter Date: 04/28/2018  PT End of Session - 04/28/18 1144    Visit Number  1    Number of Visits  12    Date for PT Re-Evaluation  05/26/18    PT Start Time  0930    PT Stop Time  1008    PT Time Calculation (min)  38 min       Past Medical History:  Diagnosis Date  . Anemia of chronic disease 10/26/2013  . Hernia of unspecified site of abdominal cavity without mention of obstruction or gangrene   . Hypertension   . Liver disease   . Lynch syndrome   . Lynch syndrome   . Primary biliary cirrhosis ([place]) 10/26/2013  . Thyroid disease     Past Surgical History:  Procedure Laterality Date  . ABDOMINAL HERNIA REPAIR     Patient's states that she has had 8- 9 hernia surgeries  . ABDOMINAL HYSTERECTOMY    . CHOLECYSTECTOMY  2007  . COLON RESECTION    . COLON SURGERY  2008   Done at [facility]  . COLONOSCOPY     Done at [facility]  . FLEXIBLE SIGMOIDOSCOPY N/A 10/20/2015   Procedure: FLEXIBLE SIGMOIDOSCOPY;  Surgeon: Rogene Houston, MD;  [facility];  Service: Endoscopy;  Laterality: N/A;  13 - Dr Laural Golden has meeting until 1:00  . FLEXIBLE SIGMOIDOSCOPY N/A 07/11/2016   Procedure: FLEXIBLE SIGMOIDOSCOPY;  Surgeon: Rogene Houston, MD;  [facility];  Service: Endoscopy;  Laterality: N/A;  1200  . FLEXIBLE SIGMOIDOSCOPY N/A 08/09/2017   Procedure: FLEXIBLE SIGMOIDOSCOPY;  Surgeon: Rogene Houston, MD;  [facility];  Service: Endoscopy;  Laterality: N/A;  1:00  . HERNIA REPAIR    . LIVER TRANSPLANT  [id number]  . POLYPECTOMY  08/09/2017   Procedure: POLYPECTOMY;  Surgeon: Rogene Houston, MD;  [facility];  Service: Endoscopy;;  colon small bowel  .  SPLENECTOMY  2006  . UPPER GASTROINTESTINAL ENDOSCOPY     Done at [facility]    There were no vitals filed for this visit.   Subjective Assessment - 04/28/18 1147    Subjective  The patient reports falling about 6 months ago and injuring her left shoulder.  She reports that a recent injection has been helpful but certain movements produce severe left shoulder pain.  Her resting pain-level today is a 4/10.      Patient Stated Goals  Do my hair easier.    Currently in Pain?  Yes    Pain Score  4     Pain Location  Shoulder    Pain Orientation  Left    Pain Descriptors / Indicators  Aching;Throbbing    Pain Type  Chronic pain    Pain Radiating Towards  Right UE sometimes pain referring to wrist.    Pain Onset  More than a month ago    Pain Frequency  Constant    Aggravating Factors   See above.    Pain Relieving Factors  See above.         [facility] PT Assessment - 04/28/18 0001      Assessment   Medical Diagnosis  Rotator cuff arthropathy-left.    Referring Provider  Justice Britain  MD    Onset Date/Surgical Date  -- ~6 months.      Precautions   Precautions  None      Restrictions   Weight Bearing Restrictions  No      Balance Screen   Has the patient fallen in the past 6 months  Yes    How many times?  -- 1.    Has the patient had a decrease in activity level because of a fear of falling?   No    Is the patient reluctant to leave their home because of a fear of falling?   No      Home Environment   Living Environment  Private residence      Prior Function   Level of Independence  Independent      Observation/Other Assessments   Observations  Atrophy in region of left Infraspinatus fossa.      Posture/Postural Control   Posture/Postural Control  Postural limitations    Postural Limitations  Rounded Shoulders;Forward head      ROM / Strength   AROM / PROM / Strength  AROM;Strength      AROM   Overall AROM Comments  155 degrees of left shoulder flexion in anti-gravity  plane (patient states she is having a good day); ER= 75 degrees; full IR.      Strength   Overall Strength Comments  Flexion= 3/5; ER= 3+/5; IR= 4/5.      Palpation   Palpation comment  Tender to palpation in area of left Infraspinatus especially near humeral attachment.      Special Tests   Other special tests  Normal UE DTR's; some reproducible pain with a left shoulder impingement test; (+) left Drop Arm test.      Ambulation/Gait   Gait Comments  WNL.                Objective measurements completed on examination: See above findings.      [facility] Adult PT Treatment/Exercise - 04/28/18 0001      Modalities   Modalities  Electrical Stimulation      Electrical Stimulation   Electrical Stimulation Location  Right posterior cuff region.    Electrical Stimulation Action  Pre-mod.    Electrical Stimulation Parameters  80-150 Hz x 15 minutes.    Electrical Stimulation Goals  Pain               PT Short Term Goals - 04/28/18 1212      PT SHORT TERM GOAL #1   Title  STG's=LTG's.        PT Long Term Goals - 04/28/18 1212      PT LONG TERM GOAL #1   Title  Independent with a HEP.    Time  4    Period  Weeks    Status  New      PT LONG TERM GOAL #2   Title  Increase left shoulder strength to a solid 4/5 so she can easily perform antigravity tasks.    Time  4    Period  Weeks    Status  New      PT LONG TERM GOAL #3   Title  Perform haircare activites with left UE.    Time  4    Period  Weeks    Status  New             Plan - 04/28/18 1158    Clinical Impression Statement  The patient presents to OPPT  with a left shoulder injury after a fall approximately 6 months ago.  The patient has significant left shoulder weakness and a positive Drop Arm test.  She has limited functional use of her left UE especially with overhead activites.  patient expected to do well with skilled physical therapy intervention.    Clinical Presentation  Evolving     Clinical Presentation due to:  Not improving.    Clinical Decision Making  Moderate    Rehab Potential  Good    PT Frequency  3x / week    PT Duration  4 weeks    PT Treatment/Interventions  ADLs/Self Care Home Management;Cryotherapy;Electrical Stimulation;Ultrasound;Moist Heat;Therapeutic activities;Therapeutic exercise;Patient/family education;Manual techniques;Passive range of motion;Dry needling    PT Next Visit Plan  AAROM to left shoulder progressing to AROM and isometrics; combo e'stim and U/S and STW/M.    Consulted and Agree with Plan of Care  Patient       Patient will benefit from skilled therapeutic intervention in order to improve the following deficits and impairments:  Decreased activity tolerance, Decreased range of motion, Decreased strength, Pain  Visit Diagnosis: Chronic left shoulder pain - Plan: PT plan of care cert/re-cert  Muscle weakness (generalized) - Plan: PT plan of care cert/re-cert     Problem List Patient Active Problem List   Diagnosis Date Noted  . Chronic diarrhea 06/26/2017  . Iron deficiency anemia 06/26/2017  . GAD (generalized anxiety disorder) 08/28/2016  . Vitamin D deficiency 08/28/2016  . GERD (gastroesophageal reflux disease) 08/28/2016  . Insomnia 08/28/2016  . Convulsions ([place]) 11/30/2015  . Liver transplant recipient [facility]) 11/30/2015  . Seizures ([place]) 09/27/2015  . Essential hypertension, benign 03/10/2015  . Hx of liver transplant ([place]) 12/08/2014  . Diarrhea 12/08/2014  . Colon cancer ([place]) 12/08/2014  . Hepatic encephalopathy ([place]) 10/26/2013  . Anemia of chronic disease 10/26/2013  . Personal history of colonic polyps 12/27/2012  . PBC (primary biliary cirrhosis) 03/24/2012  . Hypothyroidism 03/24/2012  . Ventral hernia, recurrent 03/24/2012    Hridaan Bouse, [place] MPT 04/28/2018, 12:17 PM  [facility] [address] [place], [place], [address] Phone: [phone]   Fax:   [phone]  Name: Cassidy Barber MRN: [id number] Date of Birth: 1951/03/07

## 2018-05-01 LAB — CMP14+EGFR
ALBUMIN: 3.6 g/dL (ref 3.6–4.8)
ALT: 10 IU/L (ref 0–32)
AST: 17 IU/L (ref 0–40)
Albumin/Globulin Ratio: 1.2 (ref 1.2–2.2)
Alkaline Phosphatase: 147 IU/L — ABNORMAL HIGH (ref 39–117)
BUN/Creatinine Ratio: 15 (ref 12–28)
BUN: 14 mg/dL (ref 8–27)
Bilirubin Total: 0.3 mg/dL (ref 0.0–1.2)
CALCIUM: 9.4 mg/dL (ref 8.7–10.3)
CO2: 25 mmol/L (ref 20–29)
CREATININE: 0.92 mg/dL (ref 0.57–1.00)
Chloride: 101 mmol/L (ref 96–106)
GFR calc Af Amer: 75 mL/min/{1.73_m2} (ref >59)
GFR, EST NON AFRICAN AMERICAN: 65 mL/min/{1.73_m2} (ref >59)
GLOBULIN, TOTAL: 2.9 g/dL (ref 1.5–4.5)
GLUCOSE: 90 mg/dL (ref 65–99)
Potassium: 4.7 mmol/L (ref 3.5–5.2)
SODIUM: 141 mmol/L (ref 134–144)
Total Protein: 6.5 g/dL (ref 6.0–8.5)

## 2018-05-01 LAB — CBC WITH DIFFERENTIAL/PLATELET
BASOS: 2 %
Basophils Absolute: 0.2 10*3/uL (ref 0.0–0.2)
EOS (ABSOLUTE): 1.1 10*3/uL — ABNORMAL HIGH (ref 0.0–0.4)
EOS: 12 %
HEMATOCRIT: 35.2 % (ref 34.0–46.6)
HEMOGLOBIN: 10.7 g/dL — AB (ref 11.1–15.9)
IMMATURE GRANS (ABS): 0 10*3/uL (ref 0.0–0.1)
Immature Granulocytes: 0 %
LYMPHS: 26 %
Lymphocytes Absolute: 2.3 10*3/uL (ref 0.7–3.1)
MCH: 25.3 pg — ABNORMAL LOW (ref 26.6–33.0)
MCHC: 30.4 g/dL — ABNORMAL LOW (ref 31.5–35.7)
MCV: 83 fL (ref 79–97)
MONOCYTES: 14 %
MONOS ABS: 1.2 10*3/uL — AB (ref 0.1–0.9)
Neutrophils Absolute: 4.2 10*3/uL (ref 1.4–7.0)
Neutrophils: 46 %
Platelets: 377 10*3/uL (ref 150–450)
RBC: 4.23 x10E6/uL (ref 3.77–5.28)
RDW: 21.8 % — ABNORMAL HIGH (ref 12.3–15.4)
WBC: 8.9 10*3/uL (ref 3.4–10.8)

## 2018-05-01 LAB — TACROLIMUS LEVEL: TACROLIMUS LVL: 4.3 ng/mL (ref 2.0–20.0)

## 2018-05-01 LAB — EVEROLIMUS: EVEROLIMUS: NOT DETECTED ng/mL (ref 3.0–8.0)

## 2018-05-01 LAB — MAGNESIUM: Magnesium: 1.7 mg/dL (ref 1.6–2.3)

## 2018-05-02 ENCOUNTER — Ambulatory Visit: Payer: Medicare Other | Admitting: Physical Therapy

## 2018-05-02 DIAGNOSIS — M6281 Muscle weakness (generalized): Secondary | ICD-10-CM

## 2018-05-02 DIAGNOSIS — M25512 Pain in left shoulder: Principal | ICD-10-CM

## 2018-05-02 DIAGNOSIS — G8929 Other chronic pain: Secondary | ICD-10-CM

## 2018-05-02 NOTE — Therapy (Signed)
[facility] [place], [place], [address] Phone: [phone]   Fax:  [phone]  Physical Therapy Treatment  Patient Details  Name: Cassidy Barber MRN: [id number] Date of Birth: 19-Nov-1950 Referring Provider: Justice Britain MD   Encounter Date: 05/02/2018  PT End of Session - 05/02/18 0931    Visit Number  2    Number of Visits  12    Date for PT Re-Evaluation  05/26/18    PT Start Time  0900    PT Stop Time  0950    PT Time Calculation (min)  50 min    Activity Tolerance  Patient tolerated treatment well    Behavior During Therapy  [facility] for tasks assessed/performed       Past Medical History:  Diagnosis Date  . Anemia of chronic disease 10/26/2013  . Hernia of unspecified site of abdominal cavity without mention of obstruction or gangrene   . Hypertension   . Liver disease   . Lynch syndrome   . Lynch syndrome   . Primary biliary cirrhosis ([place]) 10/26/2013  . Thyroid disease     Past Surgical History:  Procedure Laterality Date  . ABDOMINAL HERNIA REPAIR     Patient's states that she has had 8- 9 hernia surgeries  . ABDOMINAL HYSTERECTOMY    . CHOLECYSTECTOMY  2007  . COLON RESECTION    . COLON SURGERY  2008   [facility]  . COLONOSCOPY     Done at [facility]  . FLEXIBLE SIGMOIDOSCOPY N/A 10/20/2015   Procedure: FLEXIBLE SIGMOIDOSCOPY;  Surgeon: Rogene Houston, MD;  [facility];  Service: Endoscopy;  Laterality: N/A;  22 - Dr Laural Golden has meeting until 1:00  . FLEXIBLE SIGMOIDOSCOPY N/A 07/11/2016   Procedure: FLEXIBLE SIGMOIDOSCOPY;  Surgeon: Rogene Houston, MD;  [facility];  Service: Endoscopy;  Laterality: N/A;  1200  . FLEXIBLE SIGMOIDOSCOPY N/A 08/09/2017   Procedure: FLEXIBLE SIGMOIDOSCOPY;  Surgeon: Rogene Houston, MD;  [facility];  Service: Endoscopy;  Laterality: N/A;  1:00  . HERNIA REPAIR    . LIVER TRANSPLANT  [id number]  . POLYPECTOMY  08/09/2017   Procedure:  POLYPECTOMY;  Surgeon: Rogene Houston, MD;  [facility];  Service: Endoscopy;;  colon small bowel  . SPLENECTOMY  2006  . UPPER GASTROINTESTINAL ENDOSCOPY     Done at [facility]    There were no vitals filed for this visit.  Subjective Assessment - 05/02/18 0922    Subjective  Pt arriving to therapy reporting no pain at rest, but reporting pain when trying to get ready this morning and when fixing her hair.     Patient Stated Goals  Do my hair easier.    Currently in Pain?  Yes    Pain Score  5  when fixing her hair    Pain Location  Shoulder    Pain Orientation  Left    Pain Descriptors / Indicators  Aching;Throbbing    Pain Type  Chronic pain    Aggravating Factors   reaching upward, fixing hair    Pain Relieving Factors  biofreeze                       [facility] Adult PT Treatment/Exercise - 05/02/18 0001      Exercises   Exercises  Shoulder      Shoulder Exercises: Standing   Flexion  AROM;Strengthening;10 reps;Theraband    Theraband Level (  Shoulder Flexion)  Level 1 (Yellow)    ABduction  AROM;Strengthening;10 reps;Theraband    Theraband Level (Shoulder ABduction)  Level 1 (Yellow)    Extension  AROM;Strengthening;10 reps;Theraband    Theraband Level (Shoulder Extension)  Level 1 (Yellow)    Row  AROM;Strengthening;10 reps;Theraband    Theraband Level (Shoulder Row)  Level 1 (Yellow)      Shoulder Exercises: Pulleys   Flexion  2 minutes      Shoulder Exercises: ROM/Strengthening   UBE (Upper Arm Bike)  5 minutes 2.5/2.5 minutes forward and back 120 speed      Shoulder Exercises: Isometric Strengthening   Flexion  5X10"    Extension  5X10"    External Rotation  5X10"    Internal Rotation  5X10"      Shoulder Exercises: Stretch   Corner Stretch  3 reps;20 seconds      Modalities   Modalities  Electrical Stimulation;Moist Heat      Moist Heat Therapy   Number Minutes Moist Heat  15 Minutes    Moist Heat Location  Shoulder      Electrical  Stimulation   Electrical Stimulation Location  Pt reported she will use her TENS machine at home    Electrical Stimulation Action  --    Electrical Stimulation Parameters  --    Electrical Stimulation Goals  --      Manual Therapy   Manual Therapy  Soft tissue mobilization;Scapular mobilization;Passive ROM    Manual therapy comments  Pt with tenderness over biceps and infraspinatus     Soft tissue mobilization  STW to left shoulder    Scapular Mobilization  gentle scapular mobs    Passive ROM  all planes               PT Short Term Goals - 04/28/18 1212      PT SHORT TERM GOAL #1   Title  STG's=LTG's.        PT Long Term Goals - 05/02/18 1002      PT LONG TERM GOAL #1   Title  Independent with a HEP.    Time  4    Period  Weeks    Status  On-going      PT LONG TERM GOAL #2   Title  Increase left shoulder strength to a solid 4/5 so she can easily perform antigravity tasks.    Time  4    Period  Weeks    Status  New      PT LONG TERM GOAL #3   Title  Perform haircare activites with left UE.    Period  Weeks    Status  New            Plan - 05/02/18 8756    Clinical Impression Statement  Pt tolerating treatment well reporting mild increase in pain with ER on anterior shoulder and with flexion. Pt reporting less pain following STW and moist heat. Contiue skilled PT to progress toward goals set.     Clinical Decision Making  Moderate    Rehab Potential  Good    PT Frequency  3x / week    PT Duration  4 weeks    PT Treatment/Interventions  ADLs/Self Care Home Management;Cryotherapy;Electrical Stimulation;Ultrasound;Moist Heat;Therapeutic activities;Therapeutic exercise;Patient/family education;Manual techniques;Passive range of motion;Dry needling    PT Next Visit Plan  AAROM to left shoulder progressing to AROM and isometrics; combo e'stim and U/S and STW/M.    Consulted and Agree with Plan of  Care  Patient       Patient will benefit from skilled  therapeutic intervention in order to improve the following deficits and impairments:  Decreased activity tolerance, Decreased range of motion, Decreased strength, Pain  Visit Diagnosis: Chronic left shoulder pain  Muscle weakness (generalized)     Problem List Patient Active Problem List   Diagnosis Date Noted  . Chronic diarrhea 06/26/2017  . Iron deficiency anemia 06/26/2017  . GAD (generalized anxiety disorder) 08/28/2016  . Vitamin D deficiency 08/28/2016  . GERD (gastroesophageal reflux disease) 08/28/2016  . Insomnia 08/28/2016  . Convulsions ([place]) 11/30/2015  . Liver transplant recipient [facility]) 11/30/2015  . Seizures ([place]) 09/27/2015  . Essential hypertension, benign 03/10/2015  . Hx of liver transplant ([place]) 12/08/2014  . Diarrhea 12/08/2014  . Colon cancer ([place]) 12/08/2014  . Hepatic encephalopathy ([place]) 10/26/2013  . Anemia of chronic disease 10/26/2013  . Personal history of colonic polyps 12/27/2012  . PBC (primary biliary cirrhosis) 03/24/2012  . Hypothyroidism 03/24/2012  . Ventral hernia, recurrent 03/24/2012    Oretha Caprice, MPT 05/02/2018, 10:25 AM  [facility] [address] [place], [place], [address] Phone: [phone]   Fax:  [phone]  Name: Cassidy Barber MRN: [id number] Date of Birth: 22-Jun-1951

## 2018-05-07 ENCOUNTER — Encounter: Payer: Self-pay | Admitting: Physical Therapy

## 2018-05-07 ENCOUNTER — Ambulatory Visit: Payer: Medicare Other | Admitting: Physical Therapy

## 2018-05-07 DIAGNOSIS — M6281 Muscle weakness (generalized): Secondary | ICD-10-CM

## 2018-05-07 DIAGNOSIS — M25512 Pain in left shoulder: Secondary | ICD-10-CM | POA: Diagnosis not present

## 2018-05-07 DIAGNOSIS — G8929 Other chronic pain: Secondary | ICD-10-CM | POA: Diagnosis not present

## 2018-05-07 NOTE — Therapy (Addendum)
[facility] [address] [place], [place], [address] Phone: [phone]   Fax:  [phone]  Physical Therapy Treatment/Discharge  Patient Details  Name: Nelva B Kirchgessner MRN: [id number] Date of Birth: 03/21/1951 Referring Provider: Kevin Supple MD   Encounter Date: 05/07/2018  PT End of Session - 05/07/18 1147    Visit Number  3    Number of Visits  12    Date for PT Re-Evaluation  05/26/18    PT Start Time  0900    PT Stop Time  0953    PT Time Calculation (min)  53 min       Past Medical History:  Diagnosis Date  . Anemia of chronic disease 10/26/2013  . Hernia of unspecified site of abdominal cavity without mention of obstruction or gangrene   . Hypertension   . Liver disease   . Lynch syndrome   . Lynch syndrome   . Primary biliary cirrhosis ([facility]) 10/26/2013  . Thyroid disease     Past Surgical History:  Procedure Laterality Date  . ABDOMINAL HERNIA REPAIR     Patient's states that she has had 8- 9 hernia surgeries  . ABDOMINAL HYSTERECTOMY    . CHOLECYSTECTOMY  2007  . COLON RESECTION    . COLON SURGERY  2008   Done at [facility]  . COLONOSCOPY     Done at [facility]  . FLEXIBLE SIGMOIDOSCOPY N/A 10/20/2015   Procedure: FLEXIBLE SIGMOIDOSCOPY;  Surgeon: Najeeb U Rehman, MD;  [facility];  Service: Endoscopy;  Laterality: N/A;  115 - Dr Rehman has meeting until 1:00  . FLEXIBLE SIGMOIDOSCOPY N/A 07/11/2016   Procedure: FLEXIBLE SIGMOIDOSCOPY;  Surgeon: Najeeb U Rehman, MD;  [facility];  Service: Endoscopy;  Laterality: N/A;  1200  . FLEXIBLE SIGMOIDOSCOPY N/A 08/09/2017   Procedure: FLEXIBLE SIGMOIDOSCOPY;  Surgeon: Rehman, Najeeb U, MD;  [facility];  Service: Endoscopy;  Laterality: N/A;  1:00  . HERNIA REPAIR    . LIVER TRANSPLANT  [id number]  . POLYPECTOMY  08/09/2017   Procedure: POLYPECTOMY;  Surgeon: Rehman, Najeeb U, MD;  [facility];  Service: Endoscopy;;  colon small bowel  .  SPLENECTOMY  2006  . UPPER GASTROINTESTINAL ENDOSCOPY     Done at [facility]    There were no vitals filed for this visit.  Subjective Assessment - 05/07/18 0938    Subjective  I'm doing better.  Tried my TENS at home but your machine is much better.    Patient Stated Goals  Do my hair easier.    Currently in Pain?  Yes    Pain Score  3     Pain Location  Shoulder    Pain Orientation  Left    Pain Descriptors / Indicators  Aching;Throbbing    Pain Type  Chronic pain    Pain Onset  More than a month ago                       [facility] Adult PT Treatment/Exercise - 05/07/18 0001      Exercises   Exercises  Shoulder      Shoulder Exercises: Pulleys   Flexion  5 minutes      Shoulder Exercises: ROM/Strengthening   UBE (Upper Arm Bike)  8 minutes.      Modalities   Modalities  Electrical Stimulation;Ultrasound      Moist Heat Therapy   Number Minutes Moist Heat  20 Minutes      Moist Heat Location  -- Left shoulder.      Acupuncturist Location  Right posterior cuff.    Electrical Stimulation Action  Pre-mod.    Electrical Stimulation Parameters  80-150 Hz x 20 minutes.    Electrical Stimulation Goals  Pain      Ultrasound   Ultrasound Location  Left posterior cuff    Ultrasound Parameters  Combo e'stim/U/S at 1.50 W/CM2 x 10 minutes.               PT Short Term Goals - 04/28/18 1212      PT SHORT TERM GOAL #1   Title  STG's=LTG's.        PT Long Term Goals - 05/02/18 1002      PT LONG TERM GOAL #1   Title  Independent with a HEP.    Time  4    Period  Weeks    Status  On-going      PT LONG TERM GOAL #2   Title  Increase left shoulder strength to a solid 4/5 so she can easily perform antigravity tasks.    Time  4    Period  Weeks    Status  New      PT LONG TERM GOAL #3   Title  Perform haircare activites with left UE.    Period  Weeks    Status  New              Patient will benefit from  skilled therapeutic intervention in order to improve the following deficits and impairments:     Visit Diagnosis: Chronic left shoulder pain  Muscle weakness (generalized)     Problem List Patient Active Problem List   Diagnosis Date Noted  . Chronic diarrhea 06/26/2017  . Iron deficiency anemia 06/26/2017  . GAD (generalized anxiety disorder) 08/28/2016  . Vitamin D deficiency 08/28/2016  . GERD (gastroesophageal reflux disease) 08/28/2016  . Insomnia 08/28/2016  . Convulsions ([place]) 11/30/2015  . Liver transplant recipient [facility]) 11/30/2015  . Seizures ([place]) 09/27/2015  . Essential hypertension, benign 03/10/2015  . Hx of liver transplant ([place]) 12/08/2014  . Diarrhea 12/08/2014  . Colon cancer ([place]) 12/08/2014  . Hepatic encephalopathy ([place]) 10/26/2013  . Anemia of chronic disease 10/26/2013  . Personal history of colonic polyps 12/27/2012  . PBC (primary biliary cirrhosis) 03/24/2012  . Hypothyroidism 03/24/2012  . Ventral hernia, recurrent 03/24/2012   PHYSICAL THERAPY DISCHARGE SUMMARY  Visits from Start of Care: 3  Current functional level related to goals / functional outcomes: See above   Remaining deficits: Goals not met   Education / Equipment: HEP Plan: Patient agrees to discharge.  Patient goals were not met. Patient is being discharged due to not returning since the last visit.  ?????      Maniyah Moller, [place] MPT 05/07/2018, 11:48 AM  [facility] [address] [place], [place], [address] Phone: [phone]   Fax:  [phone]  Name: RAETTA AGOSTINELLI MRN: [id number] Date of Birth: 03/27/1951

## 2018-05-12 ENCOUNTER — Ambulatory Visit: Payer: Medicare Other | Admitting: Family

## 2018-05-12 ENCOUNTER — Encounter: Payer: Self-pay | Admitting: Family

## 2018-05-12 VITALS — BP 125/81 | HR 78 | Temp 97.8°F | Ht 66.0 in | Wt 141.4 lb

## 2018-05-12 DIAGNOSIS — M19012 Primary osteoarthritis, left shoulder: Secondary | ICD-10-CM | POA: Diagnosis not present

## 2018-05-12 DIAGNOSIS — K219 Gastro-esophageal reflux disease without esophagitis: Secondary | ICD-10-CM | POA: Diagnosis not present

## 2018-05-12 DIAGNOSIS — J029 Acute pharyngitis, unspecified: Secondary | ICD-10-CM

## 2018-05-12 DIAGNOSIS — F458 Other somatoform disorders: Secondary | ICD-10-CM | POA: Diagnosis not present

## 2018-05-12 DIAGNOSIS — R0989 Other specified symptoms and signs involving the circulatory and respiratory systems: Secondary | ICD-10-CM

## 2018-05-12 DIAGNOSIS — F411 Generalized anxiety disorder: Secondary | ICD-10-CM

## 2018-05-12 DIAGNOSIS — R198 Other specified symptoms and signs involving the digestive system and abdomen: Secondary | ICD-10-CM

## 2018-05-12 LAB — RAPID STREP SCREEN (MED CTR MEBANE ONLY): Strep Gp A Ag, IA W/Reflex: NEGATIVE

## 2018-05-12 LAB — CULTURE, GROUP A STREP

## 2018-05-12 MED ORDER — ALPRAZOLAM 0.5 MG PO TABS: 0.5000 mg | ORAL_TABLET | Freq: Every evening | ORAL | 5 refills | Status: DC | PRN

## 2018-05-12 MED ORDER — HYDROCODONE-ACETAMINOPHEN 5-325 MG PO TABS: 1.0000 | ORAL_TABLET | Freq: Four times a day (QID) | ORAL | 0 refills | Status: DC | PRN

## 2018-05-12 NOTE — Progress Notes (Signed)
Subjective:    Patient ID: Cassidy Barber, female    DOB: 08-20-1951, 67 y.o.   MRN: [id number]  Chief Complaint  Patient presents with  . lump in  throat    Sore Throat   This is a new problem. The current episode started 1 to 4 weeks ago. The problem has been unchanged. The pain is worse on the right side. There has been no fever. The pain is at a severity of 2/10. The pain is mild. Associated symptoms include congestion, headaches, a hoarse voice and trouble swallowing. Pertinent negatives include no coughing, ear discharge, ear pain or plugged ear sensation. She has tried acetaminophen for the symptoms. The treatment provided mild relief.  Anxiety  Presents for follow-up visit. Symptoms include excessive worry, irritability, malaise and nervous/anxious behavior. Symptoms occur occasionally. The severity of symptoms is moderate.    Shoulder Pain   The pain is present in the left shoulder. This is a recurrent problem. The current episode started more than 1 month ago. There has been a history of trauma. The problem occurs intermittently. The problem has been waxing and waning. The quality of the pain is described as aching. The pain is at a severity of 7/10. The pain is moderate. She has tried acetaminophen, oral narcotics and rest for the symptoms. The treatment provided mild relief.      Review of Systems  Constitutional: Positive for irritability.  HENT: Positive for congestion, hoarse voice and trouble swallowing. Negative for ear discharge and ear pain.   Respiratory: Negative for cough.   Neurological: Positive for headaches.  Psychiatric/Behavioral: The patient is nervous/anxious.   All other systems reviewed and are negative.      Objective:   Physical Exam  Constitutional: She is oriented to person, place, and time. She appears well-developed and well-nourished. No distress.  HENT:  Head: Normocephalic and atraumatic.  Right Ear: External ear normal.  Left Ear:  External ear normal.  Mouth/Throat: Posterior oropharyngeal erythema present.  Eyes: Pupils are equal, round, and reactive to light.  Neck: Normal range of motion. Neck supple. No thyromegaly present.  Cardiovascular: Normal rate, regular rhythm, normal heart sounds and intact distal pulses.  No murmur heard. Pulmonary/Chest: Effort normal and breath sounds normal. No respiratory distress. She has no wheezes.  Abdominal: Soft. Bowel sounds are normal. She exhibits no distension. There is no tenderness.  Musculoskeletal: Normal range of motion. She exhibits no edema or tenderness.  Neurological: She is alert and oriented to person, place, and time. She has normal reflexes. No cranial nerve deficit.  Skin: Skin is warm and dry.  Psychiatric: She has a normal mood and affect. Her behavior is normal. Judgment and thought content normal.  Vitals reviewed.     BP 125/81   Pulse 78   Temp 97.8 F (36.6 C) (Oral)   Ht 5\' 6"  (1.676 m)   Wt 141 lb 6.4 oz (64.1 kg)   BMI 22.82 kg/m      Assessment & Plan:  Lyanna was seen today for lump in  throat.  Diagnoses and all orders for this visit:  Sore throat -     Rapid Strep Screen ([facility] & [facility] ONLY)  Globus pharyngeus  GAD (generalized anxiety disorder) -     ALPRAZolam (XANAX) 0.5 MG tablet; Take 1 tablet (0.5 mg total) by mouth at bedtime as needed.  Primary osteoarthritis of left shoulder -     HYDROcodone-acetaminophen (NORCO/VICODIN) 5-325 MG tablet; Take 1 tablet by mouth every  6 (six) hours as needed for moderate pain.  Gastroesophageal reflux disease, esophagitis presence not specified   Pt will restart flonase Continue Protonix  She has follow up with GI tomorrow Strep negative today, but culture pending RTO if symptoms worsen or do improve  [name], FNP

## 2018-05-12 NOTE — Addendum Note (Signed)
Addended by: Shelbie Ammons on: 05/12/2018 03:53 PM   Modules accepted: Orders

## 2018-05-12 NOTE — Patient Instructions (Signed)
Globus Pharyngeus Globus pharyngeus is a condition that makes it feel like you have a lump in your throat. It may also feel like you have something stuck in the front of your throat. This feeling may come and go. It is not painful, and it does not make it harder to swallow food or liquid. Globus pharyngeus does not cause changes that a health care provider can see during a physical exam. This condition usually goes away without treatment. What are the causes? Often, no cause can be found. The most common cause of globus pharyngeus is a condition that causes stomach juices to flow back up into the throat (gastroesophageal reflux). Other possible causes include:  Overstimulation of nerves that control swallowing.  Irritation of nerves that control swallowing (neuralgia).  An enlarged gland in the lower neck (thyroid gland).  Growth of tonsil tissue at the base of the tongue (lingual tonsil).  Anxiety.  Depression.  What are the signs or symptoms? The main symptom of this condition is a feeling of a lump in your throat. This feeling usually comes and goes. How is this diagnosed? This condition may be diagnosed after other conditions have been ruled out. You may have tests, such as:  A swallow study.  Ear, nose, and throat evaluation.  An exam of your throat using a thin, flexible tube with a light and camera on the end (endoscopy).  How is this treated? This condition may go away on its own, without treatment. In some cases, antidepressant medicines may be helpful. Follow these instructions at home:  Follow instructions from your health care provider about eating or drinking restrictions.  Take over-the-counter and prescription medicines only as told by your health care provider.  Keep all follow-up visits as told by your health care provider. This is important.  Follow instructions from your health care provider about home care for gastroesophageal reflux. Your health care  provider may recommend that you: ? Do not eat or drink anything that causes heartburn. ? Do not eat heavy meals close to bedtime. ? Do not drink caffeine. ? Do not drink alcohol. ? Raise the head of your bed. ? Sleep on your left side. Contact a health care provider if:  Your symptoms get worse.  You have throat pain.  You have trouble swallowing.  Food or liquid comes back up into your mouth.  You lose weight without trying. Get help right away if:  You develop swelling in your throat. Summary  Globus pharyngeus is a condition that makes it feel like you have a lump in your throat.  This condition usually goes away without treatment. This information is not intended to replace advice given to you by your health care provider. Make sure you discuss any questions you have with your health care provider. Document Released: 07/04/2016 Document Revised: 07/04/2016 Document Reviewed: 07/04/2016 Elsevier Interactive Patient Education  2018 Elsevier Inc.  

## 2018-05-12 NOTE — Addendum Note (Signed)
Addended by: Shelbie Ammons on: 05/12/2018 04:13 PM   Modules accepted: Orders

## 2018-05-13 DIAGNOSIS — Z944 Liver transplant status: Secondary | ICD-10-CM | POA: Diagnosis not present

## 2018-05-13 DIAGNOSIS — D899 Disorder involving the immune mechanism, unspecified: Secondary | ICD-10-CM | POA: Diagnosis not present

## 2018-05-14 DIAGNOSIS — Z944 Liver transplant status: Secondary | ICD-10-CM | POA: Diagnosis not present

## 2018-05-15 LAB — UPPER RESPIRATORY CULTURE, ROUTINE

## 2018-05-20 ENCOUNTER — Other Ambulatory Visit: Payer: Self-pay | Admitting: Physician Assistant

## 2018-05-20 DIAGNOSIS — C44622 Squamous cell carcinoma of skin of right upper limb, including shoulder: Secondary | ICD-10-CM | POA: Diagnosis not present

## 2018-05-20 DIAGNOSIS — C44722 Squamous cell carcinoma of skin of right lower limb, including hip: Secondary | ICD-10-CM | POA: Diagnosis not present

## 2018-05-20 DIAGNOSIS — L570 Actinic keratosis: Secondary | ICD-10-CM | POA: Diagnosis not present

## 2018-05-20 DIAGNOSIS — C44729 Squamous cell carcinoma of skin of left lower limb, including hip: Secondary | ICD-10-CM | POA: Diagnosis not present

## 2018-05-21 ENCOUNTER — Ambulatory Visit (HOSPITAL_COMMUNITY): Payer: Medicare Other | Admitting: Internal Medicine

## 2018-05-21 DIAGNOSIS — Z944 Liver transplant status: Secondary | ICD-10-CM | POA: Diagnosis not present

## 2018-05-22 ENCOUNTER — Encounter: Payer: Self-pay | Admitting: Family

## 2018-05-26 ENCOUNTER — Other Ambulatory Visit: Payer: Medicare Other

## 2018-05-26 ENCOUNTER — Encounter (INDEPENDENT_AMBULATORY_CARE_PROVIDER_SITE_OTHER): Payer: Self-pay | Admitting: Internal Medicine

## 2018-05-26 ENCOUNTER — Ambulatory Visit: Payer: Medicare Other | Admitting: Physical Therapy

## 2018-05-26 DIAGNOSIS — E039 Hypothyroidism, unspecified: Secondary | ICD-10-CM

## 2018-05-26 DIAGNOSIS — Z298 Encounter for other specified prophylactic measures: Secondary | ICD-10-CM

## 2018-05-27 LAB — THYROID PANEL WITH TSH
Free Thyroxine Index: 2.6 (ref 1.2–4.9)
T3 Uptake Ratio: 29 % (ref 24–39)
T4, Total: 8.9 ug/dL (ref 4.5–12.0)
TSH: 0.324 u[IU]/mL — AB (ref 0.450–4.500)

## 2018-05-28 ENCOUNTER — Encounter (INDEPENDENT_AMBULATORY_CARE_PROVIDER_SITE_OTHER): Payer: Self-pay | Admitting: Internal Medicine

## 2018-05-28 DIAGNOSIS — M12819 Other specific arthropathies, not elsewhere classified, unspecified shoulder: Secondary | ICD-10-CM | POA: Insufficient documentation

## 2018-05-28 DIAGNOSIS — M19112 Post-traumatic osteoarthritis, left shoulder: Secondary | ICD-10-CM | POA: Diagnosis not present

## 2018-05-28 DIAGNOSIS — M75100 Unspecified rotator cuff tear or rupture of unspecified shoulder, not specified as traumatic: Secondary | ICD-10-CM | POA: Insufficient documentation

## 2018-05-29 LAB — CBC WITH DIFFERENTIAL/PLATELET
BASOS: 2 %
Basophils Absolute: 0.1 10*3/uL (ref 0.0–0.2)
EOS (ABSOLUTE): 0.5 10*3/uL — ABNORMAL HIGH (ref 0.0–0.4)
EOS: 8 %
HEMATOCRIT: 37.2 % (ref 34.0–46.6)
Hemoglobin: 11.3 g/dL (ref 11.1–15.9)
IMMATURE GRANULOCYTES: 0 %
Immature Grans (Abs): 0 10*3/uL (ref 0.0–0.1)
LYMPHS: 34 %
Lymphocytes Absolute: 2.3 10*3/uL (ref 0.7–3.1)
MCH: 25.5 pg — ABNORMAL LOW (ref 26.6–33.0)
MCHC: 30.4 g/dL — ABNORMAL LOW (ref 31.5–35.7)
MCV: 84 fL (ref 79–97)
Monocytes Absolute: 0.7 10*3/uL (ref 0.1–0.9)
Monocytes: 10 %
NEUTROS PCT: 46 %
Neutrophils Absolute: 3.2 10*3/uL (ref 1.4–7.0)
Platelets: 444 10*3/uL (ref 150–450)
RBC: 4.43 x10E6/uL (ref 3.77–5.28)
RDW: 20.9 % — ABNORMAL HIGH (ref 12.3–15.4)
WBC: 6.8 10*3/uL (ref 3.4–10.8)

## 2018-05-29 LAB — CMP14+EGFR
ALT: 12 IU/L (ref 0–32)
AST: 24 IU/L (ref 0–40)
Albumin/Globulin Ratio: 1.1 — ABNORMAL LOW (ref 1.2–2.2)
Albumin: 3.7 g/dL (ref 3.6–4.8)
Alkaline Phosphatase: 222 IU/L — ABNORMAL HIGH (ref 39–117)
BUN/Creatinine Ratio: 16 (ref 12–28)
BUN: 12 mg/dL (ref 8–27)
Bilirubin Total: <0.2 mg/dL (ref 0.0–1.2)
CALCIUM: 9.1 mg/dL (ref 8.7–10.3)
CO2: 21 mmol/L (ref 20–29)
CREATININE: 0.74 mg/dL (ref 0.57–1.00)
Chloride: 104 mmol/L (ref 96–106)
GFR calc Af Amer: 97 mL/min/{1.73_m2} (ref >59)
GFR, EST NON AFRICAN AMERICAN: 84 mL/min/{1.73_m2} (ref >59)
GLOBULIN, TOTAL: 3.3 g/dL (ref 1.5–4.5)
Glucose: 85 mg/dL (ref 65–99)
Potassium: 4.4 mmol/L (ref 3.5–5.2)
SODIUM: 142 mmol/L (ref 134–144)
Total Protein: 7 g/dL (ref 6.0–8.5)

## 2018-05-29 LAB — TACROLIMUS LEVEL: Tacrolimus Lvl: 3.4 ng/mL (ref 2.0–20.0)

## 2018-05-29 LAB — MAGNESIUM: Magnesium: 1.9 mg/dL (ref 1.6–2.3)

## 2018-05-29 LAB — EVEROLIMUS: EVEROLIMUS: 10 ng/mL — AB (ref 3.0–8.0)

## 2018-06-02 ENCOUNTER — Other Ambulatory Visit: Payer: Medicare Other

## 2018-06-02 DIAGNOSIS — Z298 Encounter for other specified prophylactic measures: Secondary | ICD-10-CM

## 2018-06-03 LAB — CBC WITH DIFFERENTIAL/PLATELET
BASOS: 2 %
Basophils Absolute: 0.1 10*3/uL (ref 0.0–0.2)
EOS (ABSOLUTE): 0.6 10*3/uL — AB (ref 0.0–0.4)
Eos: 11 %
HEMATOCRIT: 34.2 % (ref 34.0–46.6)
Hemoglobin: 10.6 g/dL — ABNORMAL LOW (ref 11.1–15.9)
Immature Grans (Abs): 0 10*3/uL (ref 0.0–0.1)
Immature Granulocytes: 0 %
LYMPHS ABS: 2.1 10*3/uL (ref 0.7–3.1)
Lymphs: 37 %
MCH: 25.6 pg — ABNORMAL LOW (ref 26.6–33.0)
MCHC: 31 g/dL — AB (ref 31.5–35.7)
MCV: 83 fL (ref 79–97)
MONOS ABS: 0.8 10*3/uL (ref 0.1–0.9)
Monocytes: 13 %
NEUTROS PCT: 37 %
Neutrophils Absolute: 2.2 10*3/uL (ref 1.4–7.0)
Platelets: 375 10*3/uL (ref 150–450)
RBC: 4.14 x10E6/uL (ref 3.77–5.28)
RDW: 20.3 % — ABNORMAL HIGH (ref 12.3–15.4)
WBC: 5.8 10*3/uL (ref 3.4–10.8)

## 2018-06-03 LAB — CMP14+EGFR
A/G RATIO: 1.1 — AB (ref 1.2–2.2)
ALBUMIN: 3.5 g/dL — AB (ref 3.6–4.8)
ALK PHOS: 283 IU/L — AB (ref 39–117)
ALT: 17 IU/L (ref 0–32)
AST: 31 IU/L (ref 0–40)
BUN / CREAT RATIO: 19 (ref 12–28)
BUN: 15 mg/dL (ref 8–27)
Bilirubin Total: <0.2 mg/dL (ref 0.0–1.2)
CO2: 26 mmol/L (ref 20–29)
Calcium: 8.9 mg/dL (ref 8.7–10.3)
Chloride: 103 mmol/L (ref 96–106)
Creatinine, Ser: 0.78 mg/dL (ref 0.57–1.00)
GFR calc Af Amer: 91 mL/min/{1.73_m2} (ref >59)
GFR calc non Af Amer: 79 mL/min/{1.73_m2} (ref >59)
GLOBULIN, TOTAL: 3.2 g/dL (ref 1.5–4.5)
Glucose: 97 mg/dL (ref 65–99)
POTASSIUM: 4.5 mmol/L (ref 3.5–5.2)
SODIUM: 142 mmol/L (ref 134–144)
Total Protein: 6.7 g/dL (ref 6.0–8.5)

## 2018-06-03 LAB — MAGNESIUM: Magnesium: 2 mg/dL (ref 1.6–2.3)

## 2018-06-03 LAB — EVEROLIMUS: EVEROLIMUS: 7.7 ng/mL (ref 3.0–8.0)

## 2018-06-03 LAB — TACROLIMUS LEVEL: TACROLIMUS LVL: NOT DETECTED ng/mL (ref 2.0–20.0)

## 2018-06-04 ENCOUNTER — Other Ambulatory Visit: Payer: Self-pay | Admitting: Family Medicine

## 2018-06-04 ENCOUNTER — Encounter: Payer: Self-pay | Admitting: Family

## 2018-06-04 ENCOUNTER — Other Ambulatory Visit: Payer: Self-pay | Admitting: Family

## 2018-06-05 ENCOUNTER — Encounter: Payer: Self-pay | Admitting: Family

## 2018-06-05 ENCOUNTER — Other Ambulatory Visit: Payer: Self-pay | Admitting: Family

## 2018-06-05 MED ORDER — LEVOTHYROXINE SODIUM 125 MCG PO TABS: 125.0000 ug | ORAL_TABLET | Freq: Every day | ORAL | 1 refills | Status: DC

## 2018-06-09 ENCOUNTER — Encounter: Payer: Self-pay | Admitting: Family

## 2018-06-09 ENCOUNTER — Other Ambulatory Visit: Payer: Medicare Other

## 2018-06-09 DIAGNOSIS — Z298 Encounter for other specified prophylactic measures: Secondary | ICD-10-CM | POA: Diagnosis not present

## 2018-06-10 ENCOUNTER — Encounter (INDEPENDENT_AMBULATORY_CARE_PROVIDER_SITE_OTHER): Payer: Self-pay | Admitting: Internal Medicine

## 2018-06-10 ENCOUNTER — Encounter (INDEPENDENT_AMBULATORY_CARE_PROVIDER_SITE_OTHER): Payer: Self-pay | Admitting: *Deleted

## 2018-06-10 ENCOUNTER — Encounter (INDEPENDENT_AMBULATORY_CARE_PROVIDER_SITE_OTHER): Payer: Self-pay

## 2018-06-10 ENCOUNTER — Encounter: Payer: Self-pay | Admitting: Family

## 2018-06-10 LAB — CMP14+EGFR
ALBUMIN: 3.8 g/dL (ref 3.6–4.8)
ALK PHOS: 260 IU/L — AB (ref 39–117)
ALT: 15 IU/L (ref 0–32)
AST: 34 IU/L (ref 0–40)
Albumin/Globulin Ratio: 1.2 (ref 1.2–2.2)
BUN / CREAT RATIO: 14 (ref 12–28)
BUN: 11 mg/dL (ref 8–27)
Bilirubin Total: <0.2 mg/dL (ref 0.0–1.2)
CO2: 24 mmol/L (ref 20–29)
CREATININE: 0.76 mg/dL (ref 0.57–1.00)
Calcium: 8.9 mg/dL (ref 8.7–10.3)
Chloride: 100 mmol/L (ref 96–106)
GFR calc Af Amer: 94 mL/min/{1.73_m2} (ref >59)
GFR calc non Af Amer: 81 mL/min/{1.73_m2} (ref >59)
GLUCOSE: 63 mg/dL — AB (ref 65–99)
Globulin, Total: 3.1 g/dL (ref 1.5–4.5)
Potassium: 4.9 mmol/L (ref 3.5–5.2)
Sodium: 143 mmol/L (ref 134–144)
Total Protein: 6.9 g/dL (ref 6.0–8.5)

## 2018-06-10 LAB — CBC WITH DIFFERENTIAL/PLATELET
Basophils Absolute: 0.1 10*3/uL (ref 0.0–0.2)
Basos: 2 %
EOS (ABSOLUTE): 0.5 10*3/uL — ABNORMAL HIGH (ref 0.0–0.4)
EOS: 9 %
HEMATOCRIT: 34.5 % (ref 34.0–46.6)
HEMOGLOBIN: 10.8 g/dL — AB (ref 11.1–15.9)
IMMATURE GRANS (ABS): 0 10*3/uL (ref 0.0–0.1)
IMMATURE GRANULOCYTES: 0 %
LYMPHS ABS: 2.4 10*3/uL (ref 0.7–3.1)
Lymphs: 41 %
MCH: 25.8 pg — ABNORMAL LOW (ref 26.6–33.0)
MCHC: 31.3 g/dL — ABNORMAL LOW (ref 31.5–35.7)
MCV: 83 fL (ref 79–97)
MONOCYTES: 15 %
Monocytes Absolute: 0.9 10*3/uL (ref 0.1–0.9)
NEUTROS PCT: 33 %
Neutrophils Absolute: 2 10*3/uL (ref 1.4–7.0)
Platelets: 517 10*3/uL — ABNORMAL HIGH (ref 150–450)
RBC: 4.18 x10E6/uL (ref 3.77–5.28)
RDW: 20.4 % — ABNORMAL HIGH (ref 12.3–15.4)
WBC: 5.8 10*3/uL (ref 3.4–10.8)

## 2018-06-10 LAB — TACROLIMUS LEVEL: Tacrolimus Lvl: 2.6 ng/mL (ref 2.0–20.0)

## 2018-06-10 LAB — MAGNESIUM: Magnesium: 2 mg/dL (ref 1.6–2.3)

## 2018-06-10 LAB — EVEROLIMUS: Everolimus: NOT DETECTED ng/mL (ref 3.0–8.0)

## 2018-06-11 ENCOUNTER — Other Ambulatory Visit: Payer: Self-pay | Admitting: Physician Assistant

## 2018-06-11 DIAGNOSIS — C44529 Squamous cell carcinoma of skin of other part of trunk: Secondary | ICD-10-CM | POA: Diagnosis not present

## 2018-06-12 ENCOUNTER — Other Ambulatory Visit (INDEPENDENT_AMBULATORY_CARE_PROVIDER_SITE_OTHER): Payer: Self-pay | Admitting: *Deleted

## 2018-06-12 DIAGNOSIS — Z86010 Personal history of colonic polyps: Secondary | ICD-10-CM

## 2018-06-12 DIAGNOSIS — Z85038 Personal history of other malignant neoplasm of large intestine: Secondary | ICD-10-CM

## 2018-06-12 DIAGNOSIS — Z1509 Genetic susceptibility to other malignant neoplasm: Secondary | ICD-10-CM | POA: Insufficient documentation

## 2018-06-12 DIAGNOSIS — K219 Gastro-esophageal reflux disease without esophagitis: Secondary | ICD-10-CM

## 2018-06-13 ENCOUNTER — Ambulatory Visit: Payer: Medicare Other | Admitting: Family

## 2018-06-13 ENCOUNTER — Encounter: Payer: Self-pay | Admitting: Family

## 2018-06-13 ENCOUNTER — Ambulatory Visit (INDEPENDENT_AMBULATORY_CARE_PROVIDER_SITE_OTHER): Payer: Medicare Other

## 2018-06-13 VITALS — BP 152/88 | HR 72 | Temp 97.1°F | Ht 66.0 in | Wt 142.4 lb

## 2018-06-13 DIAGNOSIS — M25512 Pain in left shoulder: Secondary | ICD-10-CM

## 2018-06-13 DIAGNOSIS — Z01818 Encounter for other preprocedural examination: Secondary | ICD-10-CM

## 2018-06-13 NOTE — Progress Notes (Signed)
   Subjective:    Patient ID: Cassidy Barber, female    DOB: 08/05/1951, 67 y.o.   MRN: [id number]  Chief Complaint  Patient presents with  . Pre-op Exam    HPI Pt presents to the office today for surgical clearance for reversed total shoulder. States she can not lift her left shoulder greater than 10 degrees. States she has intermittent aching pain of 8 out 10. States she is taking tylenol as needed.    Review of Systems  All other systems reviewed and are negative.      Objective:   Physical Exam  Constitutional: She is oriented to person, place, and time. She appears well-developed and well-nourished. No distress.  HENT:  Head: Normocephalic and atraumatic.  Right Ear: External ear normal.  Left Ear: External ear normal.  Mouth/Throat: Oropharynx is clear and moist.  Eyes: Pupils are equal, round, and reactive to light.  Neck: Normal range of motion. Neck supple. No thyromegaly present.  Cardiovascular: Normal rate, regular rhythm, normal heart sounds and intact distal pulses.  No murmur heard. Pulmonary/Chest: Effort normal and breath sounds normal. No respiratory distress. She has no wheezes.  Abdominal: Soft. Bowel sounds are normal. She exhibits no distension. There is no tenderness.  Musculoskeletal: She exhibits no edema or tenderness.  Cannot lift left shoulder greater than 10 degrees  Neurological: She is alert and oriented to person, place, and time. She has normal reflexes. No cranial nerve deficit.  Skin: Skin is warm and dry.  Psychiatric: She has a normal mood and affect. Her behavior is normal. Judgment and thought content normal.  Vitals reviewed.  EKG-Sinus  Rhythm   BP (!) 152/88   Pulse 72   Temp (!) 97.1 F (36.2 C) (Oral)   Ht 5' 6" (1.676 m)   Wt 142 lb 6.4 oz (64.6 kg)   BMI 22.98 kg/m      Assessment & Plan:  Rhyanna B Rivadeneira comes in today with chief complaint of Pre-op Exam   Diagnosis and orders addressed:  1. Pre-op evaluation Keep  follow up with [facility] pending, once back will complete forms  Keep follow up  - EKG 12-Lead - DG Chest 2 View; Future - BMP8+EGFR - TSH - CBC with Differential/Platelet - Hepatic function panel  2. Acute pain of left shoulder   Christy Hawks, FNP  

## 2018-06-13 NOTE — Patient Instructions (Signed)
Reverse Total Shoulder Replacement, Care After This sheet gives you information about how to care for yourself after your procedure. Your health care provider may also give you more specific instructions. If you have problems or questions, contact your health care provider. What can I expect after the procedure? After the procedure, it is common to have:  Pain.  Stiffness.  Follow these instructions at home: If you have a sling:  Wear the sling as told by your health care provider. Remove it only as told by your health care provider.  Loosen the sling if your fingers tingle, become numb, or turn cold and blue.  Keep the sling clean.  If the sling is not waterproof, do not let it get wet. Bathing  Do not take baths, swim, or use a hot tub until your health care provider approves. Ask your health care provider if you may take showers. You may only be allowed to take sponge baths for bathing.  If your sling is not waterproof, cover it with a watertight covering when you take a bath or shower.  Keep your bandage (dressing) dry until your health care provider says it can be removed. Incision care   Follow instructions from your health care provider about how to take care of your incision. Make sure you: ? Wash your hands with soap and water before you change your bandage (dressing). If soap and water are not available, use hand sanitizer. ? Change your dressing as told by your health care provider. ? Leave stitches (sutures), skin glue, or adhesive strips in place. These skin closures may need to stay in place for 2 weeks or longer. If adhesive strip edges start to loosen and curl up, you may trim the loose edges. Do not remove adhesive strips completely unless your health care provider tells you to do that.  Check your incision every day for signs of infection. Check for: ? More redness, swelling, or pain. ? More fluid or blood. ? Warmth. ? Pus or a bad smell. Driving  Ask your  health care provider when it is safe for you to drive.  Do not drive or use heavy machinery while taking prescription pain medicine.  Do not drive for 24 hours if you were given a medicine to help you relax (sedative). Activity  Return to your normal activities as told by your health care provider. Ask your health care provider what activities are safe for you.  Do shoulder exercises as told by your health care provider.  Do not lift your arm above shoulder level until your health care provider approves.  Do not make large arm movements.  Do not push or pull things until your health care provider approves.  Do not lift anything that is heavier than 5 lbs (2.3 kg) until your health care provider approves. Managing pain, stiffness, and swelling   If directed, put ice on your shoulder. ? Put ice in a plastic bag. ? Place a towel between your skin and the bag. ? Leave the ice on for 20 minutes, 2-3 times a day.  Move your fingers and hand often to avoid stiffness and to lessen swelling. General instructions  Do not use any products that contain nicotine or tobacco, such as cigarettes and e-cigarettes. These can delay bone healing. If you need help quitting, ask your health care provider.  To prevent or treat constipation while you are taking prescription pain medicine, your health care provider may recommend that you: ? Drink enough fluid to keep  your urine clear or pale yellow. ? Take over-the-counter or prescription medicines. ? Eat foods that are high in fiber, such as fresh fruits and vegetables, whole grains, and beans. ? Limit foods that are high in fat and processed sugars, such as fried and sweet foods.  Take over-the-counter and prescription medicines only as told by your health care provider.  Keep all follow-up visits as told by your health care provider. This is important. Contact a health care provider if:  You feel nauseous or you vomit.  You are constipated.  Constipation is when you have: ? Fewer bowel movements in a week than normal. ? Difficulty having a bowel movement. ? Stools that are dry, hard, or larger than normal.  Your arm tingles or feels numb.  Your pain gets worse, even after taking pain medicine.  You have more redness, swelling, or pain around your incision.  You have more fluid or blood coming from your incision.  Your incision feels warm to the touch.  You have pus or a bad smell coming from your incision.  You have a fever. Get help right away if:  Your shoulder joint moves out of place.  Your incision comes apart. This information is not intended to replace advice given to you by your health care provider. Make sure you discuss any questions you have with your health care provider. Document Released: 05/01/2016 Document Revised: 05/18/2016 Document Reviewed: 05/01/2016 Elsevier Interactive Patient Education  [employer].

## 2018-06-14 LAB — CBC WITH DIFFERENTIAL/PLATELET
Basophils Absolute: 0.2 10*3/uL (ref 0.0–0.2)
Basos: 2 %
EOS (ABSOLUTE): 0.3 10*3/uL (ref 0.0–0.4)
EOS: 5 %
HEMATOCRIT: 31.9 % — AB (ref 34.0–46.6)
HEMOGLOBIN: 10.2 g/dL — AB (ref 11.1–15.9)
IMMATURE GRANS (ABS): 0 10*3/uL (ref 0.0–0.1)
IMMATURE GRANULOCYTES: 0 %
LYMPHS: 30 %
Lymphocytes Absolute: 2.1 10*3/uL (ref 0.7–3.1)
MCH: 25.9 pg — ABNORMAL LOW (ref 26.6–33.0)
MCHC: 32 g/dL (ref 31.5–35.7)
MCV: 81 fL (ref 79–97)
MONOCYTES: 18 %
Monocytes Absolute: 1.3 10*3/uL — ABNORMAL HIGH (ref 0.1–0.9)
NEUTROS PCT: 45 %
Neutrophils Absolute: 3.3 10*3/uL (ref 1.4–7.0)
Platelets: 591 10*3/uL — ABNORMAL HIGH (ref 150–450)
RBC: 3.94 x10E6/uL (ref 3.77–5.28)
RDW: 20.6 % — AB (ref 12.3–15.4)
WBC: 7.2 10*3/uL (ref 3.4–10.8)

## 2018-06-14 LAB — BMP8+EGFR
BUN/Creatinine Ratio: 15 (ref 12–28)
BUN: 15 mg/dL (ref 8–27)
CALCIUM: 9.2 mg/dL (ref 8.7–10.3)
CHLORIDE: 99 mmol/L (ref 96–106)
CO2: 25 mmol/L (ref 20–29)
Creatinine, Ser: 1.03 mg/dL — ABNORMAL HIGH (ref 0.57–1.00)
GFR calc Af Amer: 65 mL/min/{1.73_m2} (ref >59)
GFR, EST NON AFRICAN AMERICAN: 56 mL/min/{1.73_m2} — AB (ref >59)
Glucose: 93 mg/dL (ref 65–99)
POTASSIUM: 5.1 mmol/L (ref 3.5–5.2)
SODIUM: 141 mmol/L (ref 134–144)

## 2018-06-14 LAB — HEPATIC FUNCTION PANEL
ALBUMIN: 3.7 g/dL (ref 3.6–4.8)
ALT: 15 IU/L (ref 0–32)
AST: 26 IU/L (ref 0–40)
Alkaline Phosphatase: 230 IU/L — ABNORMAL HIGH (ref 39–117)
BILIRUBIN, DIRECT: 0.06 mg/dL (ref 0.00–0.40)
Total Protein: 6.7 g/dL (ref 6.0–8.5)

## 2018-06-14 LAB — TSH: TSH: 1.17 u[IU]/mL (ref 0.450–4.500)

## 2018-06-16 ENCOUNTER — Encounter: Payer: Self-pay | Admitting: Family

## 2018-06-20 DIAGNOSIS — Z944 Liver transplant status: Secondary | ICD-10-CM | POA: Diagnosis not present

## 2018-06-21 ENCOUNTER — Other Ambulatory Visit: Payer: Self-pay | Admitting: Family

## 2018-06-23 ENCOUNTER — Other Ambulatory Visit: Payer: Medicare Other

## 2018-06-23 DIAGNOSIS — Z298 Encounter for other specified prophylactic measures: Secondary | ICD-10-CM | POA: Diagnosis not present

## 2018-06-23 NOTE — Telephone Encounter (Signed)
Last Vit D 02/26/17   26.7

## 2018-06-24 LAB — CBC WITH DIFFERENTIAL/PLATELET
BASOS ABS: 0.2 10*3/uL (ref 0.0–0.2)
Basos: 3 %
EOS (ABSOLUTE): 0.3 10*3/uL (ref 0.0–0.4)
Eos: 5 %
HEMATOCRIT: 33.2 % — AB (ref 34.0–46.6)
HEMOGLOBIN: 10.2 g/dL — AB (ref 11.1–15.9)
Immature Grans (Abs): 0 10*3/uL (ref 0.0–0.1)
Immature Granulocytes: 0 %
Lymphocytes Absolute: 1.6 10*3/uL (ref 0.7–3.1)
Lymphs: 26 %
MCH: 25.5 pg — AB (ref 26.6–33.0)
MCHC: 30.7 g/dL — ABNORMAL LOW (ref 31.5–35.7)
MCV: 83 fL (ref 79–97)
MONOCYTES: 13 %
MONOS ABS: 0.8 10*3/uL (ref 0.1–0.9)
NEUTROS ABS: 3.3 10*3/uL (ref 1.4–7.0)
Neutrophils: 53 %
Platelets: 469 10*3/uL — ABNORMAL HIGH (ref 150–450)
RBC: 4 x10E6/uL (ref 3.77–5.28)
RDW: 21 % — ABNORMAL HIGH (ref 12.3–15.4)
WBC: 6.2 10*3/uL (ref 3.4–10.8)

## 2018-06-24 LAB — CMP14+EGFR
ALK PHOS: 261 IU/L — AB (ref 39–117)
ALT: 14 IU/L (ref 0–32)
AST: 25 IU/L (ref 0–40)
Albumin/Globulin Ratio: 1.2 (ref 1.2–2.2)
Albumin: 3.5 g/dL — ABNORMAL LOW (ref 3.6–4.8)
BUN/Creatinine Ratio: 16 (ref 12–28)
BUN: 15 mg/dL (ref 8–27)
CHLORIDE: 103 mmol/L (ref 96–106)
CO2: 27 mmol/L (ref 20–29)
Calcium: 9.2 mg/dL (ref 8.7–10.3)
Creatinine, Ser: 0.93 mg/dL (ref 0.57–1.00)
GFR calc non Af Amer: 64 mL/min/{1.73_m2} (ref >59)
GFR, EST AFRICAN AMERICAN: 74 mL/min/{1.73_m2} (ref >59)
GLUCOSE: 104 mg/dL — AB (ref 65–99)
Globulin, Total: 2.9 g/dL (ref 1.5–4.5)
Potassium: 4.5 mmol/L (ref 3.5–5.2)
Sodium: 142 mmol/L (ref 134–144)
TOTAL PROTEIN: 6.4 g/dL (ref 6.0–8.5)

## 2018-06-24 LAB — EVEROLIMUS: Everolimus: NOT DETECTED ng/mL (ref 3.0–8.0)

## 2018-06-24 LAB — MAGNESIUM: MAGNESIUM: 1.7 mg/dL (ref 1.6–2.3)

## 2018-06-24 LAB — TACROLIMUS LEVEL: Tacrolimus Lvl: 6.1 ng/mL (ref 2.0–20.0)

## 2018-06-25 DIAGNOSIS — Z944 Liver transplant status: Secondary | ICD-10-CM | POA: Diagnosis not present

## 2018-06-27 ENCOUNTER — Other Ambulatory Visit: Payer: Self-pay | Admitting: Physician Assistant

## 2018-06-27 ENCOUNTER — Ambulatory Visit: Payer: Medicare Other | Admitting: Family

## 2018-06-27 DIAGNOSIS — D0439 Carcinoma in situ of skin of other parts of face: Secondary | ICD-10-CM | POA: Diagnosis not present

## 2018-06-27 DIAGNOSIS — C44729 Squamous cell carcinoma of skin of left lower limb, including hip: Secondary | ICD-10-CM | POA: Diagnosis not present

## 2018-06-27 DIAGNOSIS — C44722 Squamous cell carcinoma of skin of right lower limb, including hip: Secondary | ICD-10-CM | POA: Diagnosis not present

## 2018-06-27 DIAGNOSIS — L859 Epidermal thickening, unspecified: Secondary | ICD-10-CM | POA: Diagnosis not present

## 2018-06-27 DIAGNOSIS — D485 Neoplasm of uncertain behavior of skin: Secondary | ICD-10-CM | POA: Diagnosis not present

## 2018-06-30 ENCOUNTER — Other Ambulatory Visit: Payer: Self-pay | Admitting: Family

## 2018-06-30 DIAGNOSIS — I10 Essential (primary) hypertension: Secondary | ICD-10-CM

## 2018-07-07 NOTE — Pre-Procedure Instructions (Signed)
Cassidy Barber  07/07/2018      [facility] [address] [place] [address] [place] [address] Phone: [phone] Fax: [phone]  [place], [place] [address] Phone: [phone] Fax: [phone]    Your procedure is scheduled on September 5  Report to [place] at 1030 A.M.  Call this number if you have problems the morning of surgery:  [phone]   Remember:  Do not eat or drink after midnight.      Take these medicines the morning of surgery with A SIP OF WATER  acetaminophen (TYLENOL) cephALEXin (KEFLEX) cyclobenzaprine (FLEXERIL) fluticasone (FLONASE) levothyroxine (SYNTHROID)  metoprolol succinate (TOPROL-XL) pantoprazole (PROTONIX)  propranolol (INDERAL)  tacrolimus (PROGRAF) ZORTRESS   7 days prior to surgery STOP taking any Aspirin(unless otherwise instructed by your surgeon), Aleve, Naproxen, Ibuprofen, Motrin, Advil, Goody's, BC's, all herbal medications, fish oil, and all vitamins  Follow your surgeon's instructions on when to stop Asprin.  If no instructions were given by your surgeon then you will need to call the office to get those instructions.     Do not wear jewelry, make-up or nail polish.  Do not wear lotions, powders, or perfumes, or deodorant.  Do not shave 48 hours prior to surgery.   Do not bring valuables to the hospital.  [facility] is not responsible for any belongings or valuables.  Contacts, dentures or bridgework may not be worn into surgery.  Leave your suitcase in the car.  After surgery it may be brought to your room.  For patients admitted to the hospital, discharge time will be determined by your treatment team.  Patients discharged the day of surgery will not be allowed to drive home.    Special instructions:   [facility]- Preparing For Surgery  Before surgery, you can play an important role.  Because skin is not sterile, your skin needs to be as free of germs as possible. You can reduce the number of germs on your skin by washing with CHG (chlorahexidine gluconate) Soap before surgery.  CHG is an antiseptic cleaner which kills germs and bonds with the skin to continue killing germs even after washing.    Oral Hygiene is also important to reduce your risk of infection.  Remember - BRUSH YOUR TEETH THE MORNING OF SURGERY WITH YOUR REGULAR TOOTHPASTE  Please do not use if you have an allergy to CHG or antibacterial soaps. If your skin becomes reddened/irritated stop using the CHG.  Do not shave (including legs and underarms) for at least 48 hours prior to first CHG shower. It is OK to shave your face.  Please follow these instructions carefully.   1. Shower the NIGHT BEFORE SURGERY and the MORNING OF SURGERY with CHG.   2. If you chose to wash your hair, wash your hair first as usual with your normal shampoo.  3. After you shampoo, rinse your hair and body thoroughly to remove the shampoo.  4. Use CHG as you would any other liquid soap. You can apply CHG directly to the skin and wash gently with a scrungie or a clean washcloth.   5. Apply the CHG Soap to your body ONLY FROM THE NECK DOWN.  Do not use on open wounds or open sores. Avoid contact with your eyes, ears, mouth and genitals (private parts). Wash Face and genitals (private parts)  with your  normal soap.  6. Wash thoroughly, paying special attention to the area where your surgery will be performed.  7. Thoroughly rinse your body with warm water from the neck down.  8. DO NOT shower/wash with your normal soap after using and rinsing off the CHG Soap.  9. Pat yourself dry with a CLEAN TOWEL.  10. Wear CLEAN PAJAMAS to bed the night before surgery, wear comfortable clothes the morning of surgery  11. Place CLEAN SHEETS on your bed the night of your first shower and DO NOT SLEEP WITH PETS.    Day of Surgery:  Do not  apply any deodorants/lotions.  Please wear clean clothes to the hospital/surgery center.   Remember to brush your teeth WITH YOUR REGULAR TOOTHPASTE.    Please read over the following fact sheets that you were given.

## 2018-07-08 ENCOUNTER — Other Ambulatory Visit: Payer: Self-pay

## 2018-07-08 ENCOUNTER — Encounter (HOSPITAL_COMMUNITY): Payer: Self-pay

## 2018-07-08 ENCOUNTER — Encounter (HOSPITAL_COMMUNITY)
Admission: RE | Admit: 2018-07-08 | Discharge: 2018-07-08 | Disposition: A | Discharge status: Home / Self Care | Payer: Medicare Other | Source: Ambulatory Visit | Attending: Orthopedic Surgery | Admitting: Orthopedic Surgery

## 2018-07-08 DIAGNOSIS — M12812 Other specific arthropathies, not elsewhere classified, left shoulder: Secondary | ICD-10-CM | POA: Diagnosis not present

## 2018-07-08 DIAGNOSIS — Z944 Liver transplant status: Secondary | ICD-10-CM | POA: Insufficient documentation

## 2018-07-08 DIAGNOSIS — Z7982 Long term (current) use of aspirin: Secondary | ICD-10-CM | POA: Insufficient documentation

## 2018-07-08 DIAGNOSIS — Z01812 Encounter for preprocedural laboratory examination: Secondary | ICD-10-CM | POA: Insufficient documentation

## 2018-07-08 DIAGNOSIS — E039 Hypothyroidism, unspecified: Secondary | ICD-10-CM | POA: Insufficient documentation

## 2018-07-08 DIAGNOSIS — Z90710 Acquired absence of both cervix and uterus: Secondary | ICD-10-CM | POA: Diagnosis not present

## 2018-07-08 DIAGNOSIS — D638 Anemia in other chronic diseases classified elsewhere: Secondary | ICD-10-CM | POA: Diagnosis not present

## 2018-07-08 DIAGNOSIS — Z79890 Hormone replacement therapy (postmenopausal): Secondary | ICD-10-CM | POA: Insufficient documentation

## 2018-07-08 DIAGNOSIS — Z79899 Other long term (current) drug therapy: Secondary | ICD-10-CM | POA: Insufficient documentation

## 2018-07-08 DIAGNOSIS — Z7951 Long term (current) use of inhaled steroids: Secondary | ICD-10-CM | POA: Diagnosis not present

## 2018-07-08 DIAGNOSIS — Z01818 Encounter for other preprocedural examination: Secondary | ICD-10-CM | POA: Diagnosis not present

## 2018-07-08 DIAGNOSIS — Z7983 Long term (current) use of bisphosphonates: Secondary | ICD-10-CM | POA: Insufficient documentation

## 2018-07-08 DIAGNOSIS — I10 Essential (primary) hypertension: Secondary | ICD-10-CM | POA: Insufficient documentation

## 2018-07-08 DIAGNOSIS — Z9049 Acquired absence of other specified parts of digestive tract: Secondary | ICD-10-CM | POA: Diagnosis not present

## 2018-07-08 LAB — COMPREHENSIVE METABOLIC PANEL
ALT: 26 U/L (ref 0–44)
ANION GAP: 10 (ref 5–15)
AST: 41 U/L (ref 15–41)
Albumin: 3.1 g/dL — ABNORMAL LOW (ref 3.5–5.0)
Alkaline Phosphatase: 287 U/L — ABNORMAL HIGH (ref 38–126)
BUN: 16 mg/dL (ref 8–23)
CHLORIDE: 104 mmol/L (ref 98–111)
CO2: 23 mmol/L (ref 22–32)
Calcium: 9.3 mg/dL (ref 8.9–10.3)
Creatinine, Ser: 0.94 mg/dL (ref 0.44–1.00)
Glucose, Bld: 102 mg/dL — ABNORMAL HIGH (ref 70–99)
Potassium: 4.8 mmol/L (ref 3.5–5.1)
Sodium: 137 mmol/L (ref 135–145)
Total Bilirubin: 0.7 mg/dL (ref 0.3–1.2)
Total Protein: 7 g/dL (ref 6.5–8.1)

## 2018-07-08 LAB — SURGICAL PCR SCREEN
MRSA, PCR: NEGATIVE
STAPHYLOCOCCUS AUREUS: NEGATIVE

## 2018-07-08 LAB — CBC
HEMATOCRIT: 34.5 % — AB (ref 36.0–46.0)
HEMOGLOBIN: 10.7 g/dL — AB (ref 12.0–15.0)
MCH: 27 pg (ref 26.0–34.0)
MCHC: 31 g/dL (ref 30.0–36.0)
MCV: 86.9 fL (ref 78.0–100.0)
Platelets: 497 10*3/uL — ABNORMAL HIGH (ref 150–400)
RBC: 3.97 MIL/uL (ref 3.87–5.11)
RDW: 19.9 % — ABNORMAL HIGH (ref 11.5–15.5)
WBC: 7.1 10*3/uL (ref 4.0–10.5)

## 2018-07-08 NOTE — Progress Notes (Addendum)
PCP - [name] Cardiologist - denies  Chest x-ray - 06/13/18 EKG - 06/13/18 Stress Test - 2012 ECHO - 2012 Cardiac Cath - denies   Aspirin Instructions: last dose 8/19  Anesthesia review: yes, hx liver transplant Patients last dose zortress was 4 weeks  Patient denies shortness of breath, fever, cough and chest pain at PAT appointment   Patient verbalized understanding of instructions that were given to them at the PAT appointment. Patient was also instructed that they will need to review over the PAT instructions again at home before surgery.

## 2018-07-09 NOTE — Progress Notes (Signed)
Anesthesia Chart Review:  Case:  [id number] Date/Time:  07/17/18 1215   Procedure:  LEFT REVERSE SHOULDER ARTHROPLASTY (Left Shoulder) - 171min   Anesthesia type:  General   Pre-op diagnosis:  left shoulder rotator cuff tear arthropathy   Location:  [facility] OR ROOM 06 / [facility]   Surgeon:  Justice Britain, MD     DISCUSSION: 67 yo female never smoker for above procedure. Pertinent hx includes HTN, Anemia, Hypothyroid, Hx of seizure (One seizure while encephalopathic in ICU s/p liver transplant) now off Keppra. S/p liver transplant 2015.  Surgical clearance from transplant physician Dr. Merrilee Jansky on pt chart.  She saw her PCP 06/13/18 for preop clearance, note on pt chart.  Anticipate she can proceed with surgery as planned barring acute status change.  VS: BP 136/80   Pulse 68   Temp 36.7 C (Oral)   Resp 16   Ht 5\' 4"  (1.626 m)   Wt 64.2 kg   SpO2 100%   BMI 24.29 kg/m   PROVIDERS: [name], FNP is PCP last seen 06/13/2018  Laurier Nancy, MD is Transplant medicine physician last visit 05/13/2018.  LABS: Labs reviewed: Acceptable for surgery. (all labs ordered are listed, but only abnormal results are displayed)  Labs Reviewed  COMPREHENSIVE METABOLIC PANEL - Abnormal; Notable for the following components:      Result Value   Glucose, Bld 102 (*)    Albumin 3.1 (*)    Alkaline Phosphatase 287 (*)    All other components within normal limits  CBC - Abnormal; Notable for the following components:   Hemoglobin 10.7 (*)    HCT 34.5 (*)    RDW 19.9 (*)    Platelets 497 (*)    All other components within normal limits  SURGICAL PCR SCREEN     IMAGES: CT Chest 04/08/2018 (care everywhere): IMPRESSION:   1.No evidence of intrathoracic metastatic disease. 2.Right middle lobe peribronchial vascular consolidative and groundglass opacities are favored to represent a sequela of an infectious inflammatory process such as aspiration. Dilated fluid-filled esophagus seen along  with bibasilar atelectasis/scarring would support a diagnosis of aspiration. 3.Hyperattenuating material within the renal parenchyma may be due to previously administered contrast versus calcium. Correlation with prior imaging is recommended.  EKG: 06/13/2018: Sinus rhythm   CV: TTE 08/16/2014 (Care everywhere):  Left Ventricle: Normal cavity size and wall thickness. Ejection fraction  is 50 - 55%. Left ventricular systolic function is low normal.  Mild diastolic dysfunction.  Right Ventricle: Normal cavity size and ejection fraction.   Past Medical History:  Diagnosis Date  . Anemia of chronic disease 10/26/2013  . Hernia of unspecified site of abdominal cavity without mention of obstruction or gangrene   . Hypertension   . Liver disease   . Lynch syndrome   . Lynch syndrome   . Primary biliary cirrhosis ([place]) 10/26/2013  . Thyroid disease     Past Surgical History:  Procedure Laterality Date  . ABDOMINAL HERNIA REPAIR     Patient's states that she has had 8- 9 hernia surgeries  . ABDOMINAL HYSTERECTOMY    . CHOLECYSTECTOMY  2007  . COLON RESECTION    . COLON SURGERY  2008   Done at [facility]  . COLONOSCOPY     Done at [facility]  . EYE SURGERY     lasix  . FLEXIBLE SIGMOIDOSCOPY N/A 10/20/2015   Procedure: FLEXIBLE SIGMOIDOSCOPY;  Surgeon: Rogene Houston, MD;  Location: [facility];  Service: Endoscopy;  Laterality: N/A;  115 - Dr Laural Golden has meeting until 1:00  . FLEXIBLE SIGMOIDOSCOPY N/A 07/11/2016   Procedure: FLEXIBLE SIGMOIDOSCOPY;  Surgeon: Rogene Houston, MD;  Location: [facility];  Service: Endoscopy;  Laterality: N/A;  1200  . FLEXIBLE SIGMOIDOSCOPY N/A 08/09/2017   Procedure: FLEXIBLE SIGMOIDOSCOPY;  Surgeon: Rogene Houston, MD;  Location: [facility];  Service: Endoscopy;  Laterality: N/A;  1:00  . HERNIA REPAIR    . LIVER TRANSPLANT  [id number]  . POLYPECTOMY  08/09/2017   Procedure: POLYPECTOMY;  Surgeon: Rogene Houston, MD;  Location: [facility];   Service: Endoscopy;;  colon small bowel  . SPLENECTOMY  2006  . UPPER GASTROINTESTINAL ENDOSCOPY     Done at [facility]    MEDICATIONS: . acetaminophen (TYLENOL) 325 MG tablet  . alendronate (FOSAMAX) 70 MG tablet  . ALPRAZolam (XANAX) 0.5 MG tablet  . aspirin EC 81 MG tablet  . CALCIUM CITRATE PO  . cephALEXin (KEFLEX) 500 MG capsule  . Cyanocobalamin (VITAMIN B 12 PO)  . cyclobenzaprine (FLEXERIL) 5 MG tablet  . ferrous sulfate 325 (65 FE) MG tablet  . fluorouracil (EFUDEX) 5 % cream  . fluticasone (FLONASE) 50 MCG/ACT nasal spray  . hydrOXYzine (VISTARIL) 25 MG capsule  . levothyroxine (SYNTHROID) 125 MCG tablet  . loperamide (IMODIUM) 2 MG capsule  . losartan (COZAAR) 25 MG tablet  . magnesium oxide (MAG-OX) 400 MG tablet  . metoprolol succinate (TOPROL-XL) 25 MG 24 hr tablet  . Multiple Vitamins-Minerals (HAIR SKIN AND NAILS FORMULA PO)  . pantoprazole (PROTONIX) 40 MG tablet  . propranolol (INDERAL) 80 MG tablet  . tacrolimus (PROGRAF) 1 MG capsule  . ursodiol (ACTIGALL) 250 MG tablet  . Vitamin D, Ergocalciferol, (DRISDOL) 50000 units CAPS capsule  . vitamin E (VITAMIN E) 400 UNIT capsule  . ZORTRESS 0.5 MG TABS   No current facility-administered medications for this encounter.     Wynonia Musty [facility] Stay Center/Anesthesiology Phone [phone] 07/09/2018 3:14 PM

## 2018-07-16 MED ORDER — TRANEXAMIC ACID 1000 MG/10ML IV SOLN
1000.0000 mg | INTRAVENOUS | Status: AC
  Administered 2018-07-17: 1000 mg via INTRAVENOUS
  Filled 2018-07-16: qty 1000

## 2018-07-17 ENCOUNTER — Encounter (HOSPITAL_COMMUNITY): Payer: Self-pay | Admitting: *Deleted

## 2018-07-17 ENCOUNTER — Inpatient Hospital Stay (HOSPITAL_COMMUNITY): Payer: Medicare Other | Admitting: Physician Assistant

## 2018-07-17 ENCOUNTER — Inpatient Hospital Stay (HOSPITAL_COMMUNITY): Payer: Medicare Other | Admitting: Certified Registered"

## 2018-07-17 ENCOUNTER — Inpatient Hospital Stay (HOSPITAL_COMMUNITY)
Admission: RE | Admit: 2018-07-17 | Discharge: 2018-07-18 | DRG: 483 | Disposition: A | Discharge status: Home / Self Care | Payer: Medicare Other | Source: Ambulatory Visit | Attending: Orthopedic Surgery | Admitting: Orthopedic Surgery

## 2018-07-17 ENCOUNTER — Encounter (HOSPITAL_COMMUNITY)
Admission: RE | Disposition: A | Discharge status: Home / Self Care | Payer: Self-pay | Source: Ambulatory Visit | Attending: Orthopedic Surgery

## 2018-07-17 ENCOUNTER — Other Ambulatory Visit: Payer: Self-pay

## 2018-07-17 DIAGNOSIS — Z90710 Acquired absence of both cervix and uterus: Secondary | ICD-10-CM

## 2018-07-17 DIAGNOSIS — D638 Anemia in other chronic diseases classified elsewhere: Secondary | ICD-10-CM | POA: Diagnosis not present

## 2018-07-17 DIAGNOSIS — Z9081 Acquired absence of spleen: Secondary | ICD-10-CM | POA: Diagnosis not present

## 2018-07-17 DIAGNOSIS — Z1509 Genetic susceptibility to other malignant neoplasm: Secondary | ICD-10-CM

## 2018-07-17 DIAGNOSIS — Z79890 Hormone replacement therapy (postmenopausal): Secondary | ICD-10-CM

## 2018-07-17 DIAGNOSIS — Z811 Family history of alcohol abuse and dependence: Secondary | ICD-10-CM | POA: Diagnosis not present

## 2018-07-17 DIAGNOSIS — Z8042 Family history of malignant neoplasm of prostate: Secondary | ICD-10-CM

## 2018-07-17 DIAGNOSIS — M19012 Primary osteoarthritis, left shoulder: Principal | ICD-10-CM | POA: Diagnosis present

## 2018-07-17 DIAGNOSIS — Z881 Allergy status to other antibiotic agents status: Secondary | ICD-10-CM | POA: Diagnosis not present

## 2018-07-17 DIAGNOSIS — Z96619 Presence of unspecified artificial shoulder joint: Secondary | ICD-10-CM

## 2018-07-17 DIAGNOSIS — Z885 Allergy status to narcotic agent status: Secondary | ICD-10-CM

## 2018-07-17 DIAGNOSIS — Z7951 Long term (current) use of inhaled steroids: Secondary | ICD-10-CM | POA: Diagnosis not present

## 2018-07-17 DIAGNOSIS — Z7982 Long term (current) use of aspirin: Secondary | ICD-10-CM

## 2018-07-17 DIAGNOSIS — Z800 Family history of malignant neoplasm of digestive organs: Secondary | ICD-10-CM | POA: Diagnosis not present

## 2018-07-17 DIAGNOSIS — M12812 Other specific arthropathies, not elsewhere classified, left shoulder: Secondary | ICD-10-CM | POA: Diagnosis not present

## 2018-07-17 DIAGNOSIS — Z23 Encounter for immunization: Secondary | ICD-10-CM | POA: Diagnosis not present

## 2018-07-17 DIAGNOSIS — Z79899 Other long term (current) drug therapy: Secondary | ICD-10-CM | POA: Diagnosis not present

## 2018-07-17 DIAGNOSIS — Z7983 Long term (current) use of bisphosphonates: Secondary | ICD-10-CM | POA: Diagnosis not present

## 2018-07-17 DIAGNOSIS — I10 Essential (primary) hypertension: Secondary | ICD-10-CM | POA: Diagnosis present

## 2018-07-17 DIAGNOSIS — Z944 Liver transplant status: Secondary | ICD-10-CM

## 2018-07-17 DIAGNOSIS — Z801 Family history of malignant neoplasm of trachea, bronchus and lung: Secondary | ICD-10-CM

## 2018-07-17 DIAGNOSIS — E039 Hypothyroidism, unspecified: Secondary | ICD-10-CM | POA: Diagnosis not present

## 2018-07-17 DIAGNOSIS — G8918 Other acute postprocedural pain: Secondary | ICD-10-CM | POA: Diagnosis not present

## 2018-07-17 DIAGNOSIS — Z9049 Acquired absence of other specified parts of digestive tract: Secondary | ICD-10-CM | POA: Diagnosis not present

## 2018-07-17 DIAGNOSIS — M25512 Pain in left shoulder: Secondary | ICD-10-CM | POA: Diagnosis present

## 2018-07-17 HISTORY — PX: REVERSE SHOULDER ARTHROPLASTY: SHX5054

## 2018-07-17 SURGERY — ARTHROPLASTY, SHOULDER, TOTAL, REVERSE
Anesthesia: General | Site: Shoulder | Laterality: Left

## 2018-07-17 MED ORDER — CHLORHEXIDINE GLUCONATE 4 % EX LIQD: 60.0000 mL | Freq: Once | CUTANEOUS | Status: DC

## 2018-07-17 MED ORDER — ALPRAZOLAM 0.5 MG PO TABS
0.5000 mg | ORAL_TABLET | Freq: Every evening | ORAL | Status: DC | PRN
  Administered 2018-07-17: 0.5 mg via ORAL
  Filled 2018-07-17: qty 1

## 2018-07-17 MED ORDER — LACTATED RINGERS IV SOLN
INTRAVENOUS | Status: DC
  Administered 2018-07-17: 15:00:00 via INTRAVENOUS

## 2018-07-17 MED ORDER — HYDROXYZINE HCL 25 MG PO TABS
25.0000 mg | ORAL_TABLET | Freq: Two times a day (BID) | ORAL | Status: DC
  Administered 2018-07-17 – 2018-07-18 (×2): 25 mg via ORAL
  Filled 2018-07-17 (×2): qty 1

## 2018-07-17 MED ORDER — EPHEDRINE 5 MG/ML INJ
INTRAVENOUS | Status: AC
  Filled 2018-07-17: qty 20

## 2018-07-17 MED ORDER — LOSARTAN POTASSIUM 25 MG PO TABS: 25.0000 mg | ORAL_TABLET | Freq: Every day | ORAL | Status: DC

## 2018-07-17 MED ORDER — INFLUENZA VAC SPLIT HIGH-DOSE 0.5 ML IM SUSY
0.5000 mL | PREFILLED_SYRINGE | INTRAMUSCULAR | Status: AC
  Administered 2018-07-18: 0.5 mL via INTRAMUSCULAR
  Filled 2018-07-17: qty 0.5

## 2018-07-17 MED ORDER — MIDAZOLAM HCL 2 MG/2ML IJ SOLN
INTRAMUSCULAR | Status: AC
  Filled 2018-07-17: qty 2

## 2018-07-17 MED ORDER — FENTANYL CITRATE (PF) 100 MCG/2ML IJ SOLN
INTRAMUSCULAR | Status: DC | PRN
  Administered 2018-07-17: 50 ug via INTRAVENOUS

## 2018-07-17 MED ORDER — SODIUM CHLORIDE 0.9 % IV SOLN
INTRAVENOUS | Status: DC | PRN
  Administered 2018-07-17: 20 ug/min via INTRAVENOUS

## 2018-07-17 MED ORDER — ONDANSETRON HCL 4 MG/2ML IJ SOLN
INTRAMUSCULAR | Status: AC
  Filled 2018-07-17: qty 2

## 2018-07-17 MED ORDER — ACETAMINOPHEN 325 MG PO TABS: 325.0000 mg | ORAL_TABLET | Freq: Four times a day (QID) | ORAL | Status: DC | PRN

## 2018-07-17 MED ORDER — METOPROLOL SUCCINATE ER 25 MG PO TB24
25.0000 mg | ORAL_TABLET | Freq: Every day | ORAL | Status: DC
  Administered 2018-07-18: 25 mg via ORAL
  Filled 2018-07-17: qty 1

## 2018-07-17 MED ORDER — METOCLOPRAMIDE HCL 5 MG/ML IJ SOLN: 5.0000 mg | Freq: Three times a day (TID) | INTRAMUSCULAR | Status: DC | PRN

## 2018-07-17 MED ORDER — DEXAMETHASONE SODIUM PHOSPHATE 10 MG/ML IJ SOLN
INTRAMUSCULAR | Status: AC
  Filled 2018-07-17: qty 1

## 2018-07-17 MED ORDER — POLYETHYLENE GLYCOL 3350 17 G PO PACK: 17.0000 g | PACK | Freq: Every day | ORAL | Status: DC | PRN

## 2018-07-17 MED ORDER — MIDAZOLAM HCL 2 MG/2ML IJ SOLN
1.0000 mg | Freq: Once | INTRAMUSCULAR | Status: AC
  Administered 2018-07-17: 1 mg via INTRAVENOUS

## 2018-07-17 MED ORDER — OXYCODONE HCL 5 MG PO TABS: 5.0000 mg | ORAL_TABLET | Freq: Once | ORAL | Status: DC | PRN

## 2018-07-17 MED ORDER — CEFAZOLIN SODIUM-DEXTROSE 2-4 GM/100ML-% IV SOLN
INTRAVENOUS | Status: AC
  Filled 2018-07-17: qty 100

## 2018-07-17 MED ORDER — PANTOPRAZOLE SODIUM 40 MG PO TBEC
40.0000 mg | DELAYED_RELEASE_TABLET | Freq: Every day | ORAL | Status: DC
  Administered 2018-07-18: 40 mg via ORAL
  Filled 2018-07-17: qty 1

## 2018-07-17 MED ORDER — HYDROXYZINE PAMOATE 25 MG PO CAPS: 25.0000 mg | ORAL_CAPSULE | Freq: Two times a day (BID) | ORAL | Status: DC

## 2018-07-17 MED ORDER — MENTHOL 3 MG MT LOZG: 1.0000 | LOZENGE | OROMUCOSAL | Status: DC | PRN

## 2018-07-17 MED ORDER — MAGNESIUM CITRATE PO SOLN: 1.0000 | Freq: Once | ORAL | Status: DC | PRN

## 2018-07-17 MED ORDER — LACTATED RINGERS IV SOLN
INTRAVENOUS | Status: DC
  Administered 2018-07-17: 12:00:00 via INTRAVENOUS

## 2018-07-17 MED ORDER — METHOCARBAMOL 1000 MG/10ML IJ SOLN
500.0000 mg | Freq: Four times a day (QID) | INTRAVENOUS | Status: DC | PRN
  Filled 2018-07-17: qty 5

## 2018-07-17 MED ORDER — ONDANSETRON HCL 4 MG/2ML IJ SOLN: 4.0000 mg | Freq: Once | INTRAMUSCULAR | Status: DC | PRN

## 2018-07-17 MED ORDER — PROPOFOL 10 MG/ML IV BOLUS
INTRAVENOUS | Status: DC | PRN
  Administered 2018-07-17: 120 mg via INTRAVENOUS

## 2018-07-17 MED ORDER — ONDANSETRON HCL 4 MG/2ML IJ SOLN: 4.0000 mg | Freq: Four times a day (QID) | INTRAMUSCULAR | Status: DC | PRN

## 2018-07-17 MED ORDER — ROCURONIUM BROMIDE 100 MG/10ML IV SOLN
INTRAVENOUS | Status: DC | PRN
  Administered 2018-07-17: 40 mg via INTRAVENOUS

## 2018-07-17 MED ORDER — EPHEDRINE SULFATE-NACL 50-0.9 MG/10ML-% IV SOSY
PREFILLED_SYRINGE | INTRAVENOUS | Status: DC | PRN
  Administered 2018-07-17: 5 mg via INTRAVENOUS

## 2018-07-17 MED ORDER — ASPIRIN EC 81 MG PO TBEC
81.0000 mg | DELAYED_RELEASE_TABLET | Freq: Every day | ORAL | Status: DC
  Administered 2018-07-18: 81 mg via ORAL
  Filled 2018-07-17: qty 1

## 2018-07-17 MED ORDER — SUGAMMADEX SODIUM 200 MG/2ML IV SOLN
INTRAVENOUS | Status: DC | PRN
  Administered 2018-07-17: 200 mg via INTRAVENOUS

## 2018-07-17 MED ORDER — METHOCARBAMOL 500 MG PO TABS
500.0000 mg | ORAL_TABLET | Freq: Four times a day (QID) | ORAL | Status: DC | PRN
  Administered 2018-07-17 – 2018-07-18 (×2): 500 mg via ORAL
  Filled 2018-07-17 (×2): qty 1

## 2018-07-17 MED ORDER — ALUM & MAG HYDROXIDE-SIMETH 200-200-20 MG/5ML PO SUSP: 30.0000 mL | ORAL | Status: DC | PRN

## 2018-07-17 MED ORDER — PHENOL 1.4 % MT LIQD: 1.0000 | OROMUCOSAL | Status: DC | PRN

## 2018-07-17 MED ORDER — BISACODYL 5 MG PO TBEC: 5.0000 mg | DELAYED_RELEASE_TABLET | Freq: Every day | ORAL | Status: DC | PRN

## 2018-07-17 MED ORDER — FENTANYL CITRATE (PF) 100 MCG/2ML IJ SOLN
INTRAMUSCULAR | Status: AC
  Filled 2018-07-17: qty 2

## 2018-07-17 MED ORDER — PROPOFOL 10 MG/ML IV BOLUS
INTRAVENOUS | Status: AC
  Filled 2018-07-17: qty 20

## 2018-07-17 MED ORDER — DOCUSATE SODIUM 100 MG PO CAPS
100.0000 mg | ORAL_CAPSULE | Freq: Two times a day (BID) | ORAL | Status: DC
  Administered 2018-07-18: 100 mg via ORAL
  Filled 2018-07-17: qty 1

## 2018-07-17 MED ORDER — HYDROCODONE-ACETAMINOPHEN 7.5-325 MG PO TABS
1.0000 | ORAL_TABLET | ORAL | Status: DC | PRN
  Administered 2018-07-17: 2 via ORAL
  Administered 2018-07-17 – 2018-07-18 (×2): 1 via ORAL
  Administered 2018-07-18: 2 via ORAL
  Filled 2018-07-17: qty 2
  Filled 2018-07-17 (×2): qty 1
  Filled 2018-07-17: qty 2

## 2018-07-17 MED ORDER — ONDANSETRON HCL 4 MG/2ML IJ SOLN
INTRAMUSCULAR | Status: DC | PRN
  Administered 2018-07-17: 4 mg via INTRAVENOUS

## 2018-07-17 MED ORDER — DIPHENHYDRAMINE HCL 12.5 MG/5ML PO ELIX
12.5000 mg | ORAL_SOLUTION | ORAL | Status: DC | PRN
  Administered 2018-07-18: 12.5 mg via ORAL
  Filled 2018-07-17: qty 10

## 2018-07-17 MED ORDER — HYDROCODONE-ACETAMINOPHEN 5-325 MG PO TABS: 1.0000 | ORAL_TABLET | ORAL | Status: DC | PRN

## 2018-07-17 MED ORDER — METOCLOPRAMIDE HCL 5 MG PO TABS: 5.0000 mg | ORAL_TABLET | Freq: Three times a day (TID) | ORAL | Status: DC | PRN

## 2018-07-17 MED ORDER — EPHEDRINE 5 MG/ML INJ
INTRAVENOUS | Status: AC
  Filled 2018-07-17: qty 10

## 2018-07-17 MED ORDER — FENTANYL CITRATE (PF) 250 MCG/5ML IJ SOLN
INTRAMUSCULAR | Status: AC
  Filled 2018-07-17: qty 5

## 2018-07-17 MED ORDER — ONDANSETRON HCL 4 MG PO TABS: 4.0000 mg | ORAL_TABLET | Freq: Four times a day (QID) | ORAL | Status: DC | PRN

## 2018-07-17 MED ORDER — FENTANYL CITRATE (PF) 100 MCG/2ML IJ SOLN
50.0000 ug | Freq: Once | INTRAMUSCULAR | Status: AC
  Administered 2018-07-17: 50 ug via INTRAVENOUS

## 2018-07-17 MED ORDER — LIDOCAINE 2% (20 MG/ML) 5 ML SYRINGE
INTRAMUSCULAR | Status: AC
  Filled 2018-07-17: qty 5

## 2018-07-17 MED ORDER — FENTANYL CITRATE (PF) 100 MCG/2ML IJ SOLN: 25.0000 ug | INTRAMUSCULAR | Status: DC | PRN

## 2018-07-17 MED ORDER — ROCURONIUM BROMIDE 50 MG/5ML IV SOSY
PREFILLED_SYRINGE | INTRAVENOUS | Status: AC
  Filled 2018-07-17: qty 5

## 2018-07-17 MED ORDER — DEXAMETHASONE SODIUM PHOSPHATE 10 MG/ML IJ SOLN
INTRAMUSCULAR | Status: DC | PRN
  Administered 2018-07-17: 10 mg via INTRAVENOUS

## 2018-07-17 MED ORDER — OXYCODONE HCL 5 MG/5ML PO SOLN: 5.0000 mg | Freq: Once | ORAL | Status: DC | PRN

## 2018-07-17 MED ORDER — LIDOCAINE 2% (20 MG/ML) 5 ML SYRINGE
INTRAMUSCULAR | Status: DC | PRN
  Administered 2018-07-17: 50 mg via INTRAVENOUS

## 2018-07-17 MED ORDER — CEFAZOLIN SODIUM-DEXTROSE 2-4 GM/100ML-% IV SOLN
2.0000 g | INTRAVENOUS | Status: AC
  Administered 2018-07-17: 2 g via INTRAVENOUS

## 2018-07-17 MED ORDER — MORPHINE SULFATE (PF) 2 MG/ML IV SOLN
0.5000 mg | INTRAVENOUS | Status: DC | PRN
  Administered 2018-07-17 (×2): 1 mg via INTRAVENOUS
  Filled 2018-07-17 (×2): qty 1

## 2018-07-17 MED ORDER — LEVOTHYROXINE SODIUM 125 MCG PO TABS
125.0000 ug | ORAL_TABLET | Freq: Every day | ORAL | Status: DC
  Administered 2018-07-18: 125 ug via ORAL
  Filled 2018-07-17 (×2): qty 1

## 2018-07-17 MED ORDER — BUPIVACAINE LIPOSOME 1.3 % IJ SUSP
INTRAMUSCULAR | Status: DC | PRN
  Administered 2018-07-17: 10 mL

## 2018-07-17 MED ORDER — 0.9 % SODIUM CHLORIDE (POUR BTL) OPTIME
TOPICAL | Status: DC | PRN
  Administered 2018-07-17: 1000 mL

## 2018-07-17 MED ORDER — BUPIVACAINE HCL (PF) 0.5 % IJ SOLN
INTRAMUSCULAR | Status: DC | PRN
  Administered 2018-07-17: 10 mL

## 2018-07-17 SURGICAL SUPPLY — 55 items
BASEPLATE GLENOID SHLDR SM (Shoulder) ×2 IMPLANT
BLADE SAW SGTL 83.5X18.5 (BLADE) ×2 IMPLANT
COVER SURGICAL LIGHT HANDLE (MISCELLANEOUS) ×2 IMPLANT
CUP SUT UNIV REVERS 36 NEUTRAL (Cup) ×2 IMPLANT
DERMABOND ADHESIVE PROPEN (GAUZE/BANDAGES/DRESSINGS) ×1
DERMABOND ADVANCED (GAUZE/BANDAGES/DRESSINGS) ×1
DERMABOND ADVANCED .7 DNX12 (GAUZE/BANDAGES/DRESSINGS) ×1 IMPLANT
DERMABOND ADVANCED .7 DNX6 (GAUZE/BANDAGES/DRESSINGS) ×1 IMPLANT
DRAPE ORTHO SPLIT 77X108 STRL (DRAPES) ×2
DRAPE SURG 17X11 SM STRL (DRAPES) ×2 IMPLANT
DRAPE SURG ORHT 6 SPLT 77X108 (DRAPES) ×2 IMPLANT
DRAPE U-SHAPE 47X51 STRL (DRAPES) ×2 IMPLANT
DRSG AQUACEL AG ADV 3.5X10 (GAUZE/BANDAGES/DRESSINGS) ×2 IMPLANT
DURAPREP 26ML APPLICATOR (WOUND CARE) ×2 IMPLANT
ELECT BLADE 4.0 EZ CLEAN MEGAD (MISCELLANEOUS) ×2
ELECT CAUTERY BLADE 6.4 (BLADE) ×2 IMPLANT
ELECT REM PT RETURN 9FT ADLT (ELECTROSURGICAL) ×2
ELECTRODE BLDE 4.0 EZ CLN MEGD (MISCELLANEOUS) ×1 IMPLANT
ELECTRODE REM PT RTRN 9FT ADLT (ELECTROSURGICAL) ×1 IMPLANT
FACESHIELD WRAPAROUND (MASK) ×6 IMPLANT
GLENOSPHERE LATERAL 36MM+4 (Shoulder) ×2 IMPLANT
GLOVE BIO SURGEON STRL SZ7.5 (GLOVE) ×2 IMPLANT
GLOVE BIO SURGEON STRL SZ8 (GLOVE) ×2 IMPLANT
GLOVE EUDERMIC 7 POWDERFREE (GLOVE) ×2 IMPLANT
GLOVE SS BIOGEL STRL SZ 7.5 (GLOVE) ×1 IMPLANT
GLOVE SUPERSENSE BIOGEL SZ 7.5 (GLOVE) ×1
GOWN STRL REUS W/ TWL LRG LVL3 (GOWN DISPOSABLE) ×1 IMPLANT
GOWN STRL REUS W/ TWL XL LVL3 (GOWN DISPOSABLE) ×2 IMPLANT
GOWN STRL REUS W/TWL LRG LVL3 (GOWN DISPOSABLE) ×1
GOWN STRL REUS W/TWL XL LVL3 (GOWN DISPOSABLE) ×2
INSERT HUMERAL 36 +6 (Shoulder) ×2 IMPLANT
KIT BASIN OR (CUSTOM PROCEDURE TRAY) ×2 IMPLANT
KIT TURNOVER KIT B (KITS) ×2 IMPLANT
MANIFOLD NEPTUNE II (INSTRUMENTS) ×2 IMPLANT
NEEDLE TAPERED W/ NITINOL LOOP (MISCELLANEOUS) ×2 IMPLANT
NS IRRIG 1000ML POUR BTL (IV SOLUTION) ×2 IMPLANT
PACK SHOULDER (CUSTOM PROCEDURE TRAY) ×2 IMPLANT
PAD ARMBOARD 7.5X6 YLW CONV (MISCELLANEOUS) ×4 IMPLANT
RESTRAINT HEAD UNIVERSAL NS (MISCELLANEOUS) ×2 IMPLANT
SCREW CENTRAL NONLOCK 6.5X20MM (Shoulder) ×2 IMPLANT
SCREW LOCK GLENOID UNI PERI (Screw) ×2 IMPLANT
SCREW LOCK PERIPHERAL 30MM (Shoulder) ×2 IMPLANT
SET PIN UNIVERSAL REVERSE (SET/KITS/TRAYS/PACK) ×2 IMPLANT
SLING ARM FOAM STRAP LRG (SOFTGOODS) ×2 IMPLANT
SPONGE LAP 18X18 X RAY DECT (DISPOSABLE) ×4 IMPLANT
SPONGE LAP 4X18 RFD (DISPOSABLE) ×2 IMPLANT
STEM HUMERAL MOD SZ 5 135 DEG (Stem) ×2 IMPLANT
SUT MNCRL AB 3-0 PS2 18 (SUTURE) ×2 IMPLANT
SUT MON AB 2-0 CT1 27 (SUTURE) ×2 IMPLANT
SUT VIC AB 1 CT1 27 (SUTURE) ×1
SUT VIC AB 1 CT1 27XBRD ANBCTR (SUTURE) ×1 IMPLANT
SUTURE TAPE 1.3 40 TPR END (SUTURE) ×1 IMPLANT
SUTURETAPE 1.3 40 TPR END (SUTURE) ×2
TOWEL OR 17X26 10 PK STRL BLUE (TOWEL DISPOSABLE) ×2 IMPLANT
WATER STERILE IRR 1000ML POUR (IV SOLUTION) ×2 IMPLANT

## 2018-07-17 NOTE — Discharge Instructions (Signed)
° °Kevin M. Supple, M.D., F.A.A.O.S. °Orthopaedic Surgery °Specializing in Arthroscopic and Reconstructive °Surgery of the Shoulder and Knee °[phone] °[address] - [place], [place] [address] - Fax [phone] ° ° °POST-OP TOTAL SHOULDER REPLACEMENT INSTRUCTIONS ° °1. Call the office at [phone] to schedule your first post-op appointment 10-14 days from the date of your surgery. ° °2. The bandage over your incision is waterproof. You may begin showering with this dressing on. You may leave this dressing on until first follow up appointment within 2 weeks. We prefer you leave this dressing in place until follow up however after 5-7 days if you are having itching or skin irritation and would like to remove it you may do so. Go slow and tug at the borders gently to break the bond the dressing has with the skin. At this point if there is no drainage it is okay to go without a bandage or you may cover it with a light guaze and tape. You can also expect significant bruising around your shoulder that will drift down your arm and into your chest wall. This is very normal and should resolve over several days. ° ° 3. Wear your sling/immobilizer at all times except to perform the exercises below or to occasionally let your arm dangle by your side to stretch your elbow. You also need to sleep in your sling immobilizer until instructed otherwise. ° °4. Range of motion to your elbow, wrist, and hand are encouraged 3-5 times daily. Exercise to your hand and fingers helps to reduce swelling you may experience. ° °5. Utilize ice to the shoulder 3-5 times minimum a day and additionally if you are experiencing pain. ° °6. Prescriptions for a pain medication and a muscle relaxant are provided for you. It is recommended that if you are experiencing pain that you pain medication alone is not controlling, add the muscle relaxant along with the pain medication which can give additional pain relief. The first 1-2 days  is generally the most severe of your pain and then should gradually decrease. As your pain lessens it is recommended that you decrease your use of the pain medications to an "as needed basis'" only and to always comply with the recommended dosages of the pain medications. ° °7. Pain medications can produce constipation along with their use. If you experience this, the use of an over the counter stool softener or laxative daily is recommended.  ° °8. For additional questions or concerns, please do not hesitate to call the office. If after hours there is an answering service to forward your concerns to the physician on call. ° °9.Pain control following an exparel block ° °To help control your post-operative pain you received a nerve block  performed with Exparel which is a long acting anesthetic (numbing agent) which can provide pain relief and sensations of numbness (and relief of pain) in the operative shoulder and arm for up to 3 days. Sometimes it provides mixed relief, meaning you may still have numbness in certain areas of the arm but can still be able to move  parts of that arm, hand, and fingers. We recommend that your prescribed pain medications  be used as needed. We do not feel it is necessary to "pre medicate" and "stay ahead" of pain.  Taking narcotic pain medications when you are not having any pain can lead to unnecessary and potentially dangerous side effects.  ° °POST-OP EXERCISES ° °Pendulum Exercises ° °Perform pendulum exercises while standing and bending at   the waist. Support your uninvolved arm on a table or chair and allow your operated arm to hang freely. Make sure to do these exercises passively - not using you shoulder muscles. ° °Repeat 20 times. Do 3 sessions per day. ° ° ° ° °

## 2018-07-17 NOTE — Anesthesia Procedure Notes (Signed)
Anesthesia Regional Block: Interscalene brachial plexus block   Pre-Anesthetic Checklist: ,, timeout performed, Correct Patient, Correct Site, Correct Laterality, Correct Procedure, Correct Position, site marked, Risks and benefits discussed, Surgical consent,  Pre-op evaluation,  At surgeon's request  Laterality: Left  Prep: Maximum Sterile Barrier Precautions used, chloraprep       Needles:  Injection technique: Single-shot  Needle Type: Echogenic Stimulator Needle     Needle Length: 9cm  Needle Gauge: 21     Additional Needles:   Procedures:,,,, ultrasound used (permanent image in chart),,,,  Narrative:  Start time: 07/17/2018 12:05 PM End time: 07/17/2018 12:12 PM  Performed by: Personally  Anesthesiologist: Lidia Collum, MD

## 2018-07-17 NOTE — Transfer of Care (Signed)
Immediate Anesthesia Transfer of Care Note  Patient: Cassidy Barber  Procedure(s) Performed: LEFT REVERSE SHOULDER ARTHROPLASTY (Left Shoulder)  Patient Location: PACU  Anesthesia Type:GA combined with regional for post-op pain  Level of Consciousness: awake, alert  and oriented  Airway & Oxygen Therapy: Patient Spontanous Breathing and Patient connected to face mask oxygen  Post-op Assessment: Report given to RN and Post -op Vital signs reviewed and stable  Post vital signs: Reviewed and stable  Last Vitals:  Vitals Value Taken Time  BP 137/87 07/17/2018  2:05 PM  Temp    Pulse 72 07/17/2018  2:07 PM  Resp 15 07/17/2018  2:07 PM  SpO2 100 % 07/17/2018  2:07 PM  Vitals shown include unvalidated device data.  Last Pain:  Vitals:   07/17/18 1125  TempSrc:   PainSc: 9          Complications: No apparent anesthesia complications

## 2018-07-17 NOTE — Anesthesia Procedure Notes (Signed)
Procedure Name: Intubation Date/Time: 07/17/2018 12:28 PM Performed by: Gwyndolyn Saxon, CRNA Pre-anesthesia Checklist: Patient identified, Emergency Drugs available, Suction available and Patient being monitored Patient Re-evaluated:Patient Re-evaluated prior to induction Oxygen Delivery Method: Circle system utilized Preoxygenation: Pre-oxygenation with 100% oxygen Induction Type: IV induction Ventilation: Mask ventilation without difficulty Laryngoscope Size: Mac and 3 Grade View: Grade II Tube type: Oral Tube size: 7.0 mm Number of attempts: 1 Placement Confirmation: ETT inserted through vocal cords under direct vision,  positive ETCO2,  CO2 detector and breath sounds checked- equal and bilateral Secured at: 21 cm Tube secured with: Tape Dental Injury: Teeth and Oropharynx as per pre-operative assessment

## 2018-07-17 NOTE — Anesthesia Preprocedure Evaluation (Signed)
Anesthesia Evaluation  Patient identified by MRN, date of birth, ID band Patient awake    Reviewed: Allergy & Precautions, NPO status , Patient's Chart, lab work & pertinent test results, reviewed documented beta blocker date and time   History of Anesthesia Complications Negative for: history of anesthetic complications  Airway Mallampati: II  TM Distance: >3 FB Neck ROM: Full    Dental no notable dental hx.    Pulmonary neg pulmonary ROS,    Pulmonary exam normal        Cardiovascular hypertension, Pt. on home beta blockers Normal cardiovascular exam     Neuro/Psych negative neurological ROS  negative psych ROS   GI/Hepatic S/p liver transplant 2015   Endo/Other  Hypothyroidism   Renal/GU negative Renal ROS     Musculoskeletal negative musculoskeletal ROS (+)   Abdominal   Peds  Hematology  (+) anemia ,   Anesthesia Other Findings   Reproductive/Obstetrics negative OB ROS                            Anesthesia Physical Anesthesia Plan  ASA: III  Anesthesia Plan: General   Post-op Pain Management: GA combined w/ Regional for post-op pain   Induction:   PONV Risk Score and Plan: 3 and Ondansetron, Dexamethasone, Midazolam and Treatment may vary due to age or medical condition  Airway Management Planned: Oral ETT  Additional Equipment:   Intra-op Plan:   Post-operative Plan: Extubation in OR  Informed Consent: I have reviewed the patients History and Physical, chart, labs and discussed the procedure including the risks, benefits and alternatives for the proposed anesthesia with the patient or authorized representative who has indicated his/her understanding and acceptance.     Plan Discussed with: CRNA, Anesthesiologist and Surgeon  Anesthesia Plan Comments:         Anesthesia Quick Evaluation

## 2018-07-17 NOTE — Plan of Care (Signed)

## 2018-07-17 NOTE — Op Note (Signed)
07/17/2018  1:51 PM  PATIENT:   Cassidy Barber  67 y.o. female  PRE-OPERATIVE DIAGNOSIS:  left shoulder rotator cuff tear arthropathy  POST-OPERATIVE DIAGNOSIS: Same  PROCEDURE: Left reverse shoulder arthroplasty utilizing a press-fit size 5.5 stem, +6 polyethylene insert, 36/+4 glenosphere, small baseplate  SURGEON:  Nicolena Schurman, Metta Clines M.D.  ASSISTANTS: Jenetta Loges, PA-C  ANESTHESIA:   General endotracheal as well as an Exparel intrascalene block  EBL: 100 cc  SPECIMEN: None  Drains: None   PATIENT DISPOSITION:  PACU - hemodynamically stable.    PLAN OF CARE: Admit for overnight observation  Brief history:  Ms. Culmer has been followed for chronic left shoulder pain with underlying rotator cuff tear arthropathy.  She is now experiencing increasing functional limitations and is brought to the operating this time for planned left shoulder reverse arthroplasty  Preoperatively and counseled Ms. Callen regarding treatment options as well as potential risks versus benefits thereof.  Possible surgical complications were all reviewed including potential bleeding, infection, neurovascular injury, persistent pain, loss of motion, anesthetic complication, failure the implant, and possible need for additional surgery.  She understands and accepts and agrees with her planned procedure.  Procedure detail:  After undergoing routine preop evaluation patient received prophylactic antibiotics and interscalene block with Exparel was established in the holding area by the anesthesia department.  Placed supine on the operating table underwent smooth induction of a general endotracheal anesthesia.  Placed in the beachchair position and appropriately padded and protected.  Left shoulder girdle region was then sterilely prepped and draped in standard fashion.  Timeout was called.  An anterior deltopectoral approach right shoulder was made through an 8 cm incision.  Skin flaps were elevated dissection  carried deeply and electrocautery was used for hemostasis.  The deltopectoral interval was then developed from proximal to distal with a vein taken laterally the conjoined tendon was retracted medially upper centimeter the pectoralis major tendon was tenotomized to enhance exposure long head biceps tendon was then tenodesed at the upper border the pectoralis major tendon and the bicep tendon was then removed and excised proximally a venous subscapularis was then elevated from the lesser tuberosity intact with a pair of grasping suture tape sutures.  Capsular attachments from the anterior inferior and inferior aspects of the humeral neck within divided with electrocautery and the humeral head was delivered through the wound.  Using the extra medullary guide we outlined the proposed humeral head resection at the native retroversion of approximate 30 degrees.  The head was osteotomized with an oscillating saw and a metal cap was placed over the cut proximal humeral surface.  At this point we then exposed the glenoid and performed a circumferential labral resection.  Once complete visualization the periphery of the glenoid was achieved a guidepin was then placed into the center of the glenoid with an approximate 10 degree inferior tilt and the glenoid was then reamed and prepared to stable subchondral bony bed.  Our small baseplate was then impacted and transfixed with a central lag screw followed by the inferior and superior locking screws all of which obtained excellent bony purchase and fixation.  A 36+4 glenosphere was then impacted onto the baseplate with stability confirmed.  We then returned our attention to the proximal humerus where the canal was prepared hand reaming to size 6 we have broached to size 5.5 at the native retroversion and then prepared the metaphysis with the appropriate 36 reamer.  A trial implant was placed trial reduction showed good soft  tissue balance good shoulder stability.  Our trial was  then removed and on the back table we assembled the 5 5 stem with a neutral metaphysis.  This was then impacted obtaining excellent fit fixation.  We then performed trial reductions and felt that the +6 polyethylene insert gave [place] the best soft tissue balance.  Our trial poly-was removed the final poly-was impacted with excellent fit and fixation final reduction was then performed in cane with very pleased with the overall soft tissue balance and shoulder motion excellent stability.  The wound was then copiously irrigated and hemostasis was obtained.  The subscapularis was then repaired back to the lesser tuberosity using the eyelets on the collar of our humeral stem.  Final irrigation was then completed.  The deltopectoral interval was closed with a series of figure-of-eight and 1 Vicryl sutures.  2-0 Vicryl used for the subcu layer intracuticular through Monocryl for the skin followed by Dermabond and Aquasol dressing right arm was then placed into a sling and the patient was awakened, extubated, and taken recovery in stable condition  [employer], PA-C was used as an Environmental consultant throughout this case essential for help with positioning the patient, position extremity, tissue ablation, suture management, implantation of prosthesis, wound closure, and intraoperative decision-making.  Marin Shutter MD    contact # [phone]

## 2018-07-17 NOTE — H&P (Signed)
Cassidy Barber    Chief Complaint: left shoulder rotator cuff tear arthropathy HPI: The patient is a 67 y.o. female with chronic and progressively increasing left shoulder pain related to endstage left shoulder rotator cuff tear arthropathy  Past Medical History:  Diagnosis Date  . Anemia of chronic disease 10/26/2013  . Hernia of unspecified site of abdominal cavity without mention of obstruction or gangrene   . Hypertension   . Liver disease   . Lynch syndrome   . Lynch syndrome   . Primary biliary cirrhosis ([place]) 10/26/2013  . Thyroid disease     Past Surgical History:  Procedure Laterality Date  . ABDOMINAL HERNIA REPAIR     Patient's states that she has had 8- 9 hernia surgeries  . ABDOMINAL HYSTERECTOMY    . CHOLECYSTECTOMY  2007  . COLON RESECTION    . COLON SURGERY  2008   Done at [facility]  . COLONOSCOPY     Done at [facility]  . EYE SURGERY     lasix  . FLEXIBLE SIGMOIDOSCOPY N/A 10/20/2015   Procedure: FLEXIBLE SIGMOIDOSCOPY;  Surgeon: Rogene Houston, MD;  [facility];  Service: Endoscopy;  Laterality: N/A;  47 - Dr Laural Golden has meeting until 1:00  . FLEXIBLE SIGMOIDOSCOPY N/A 07/11/2016   Procedure: FLEXIBLE SIGMOIDOSCOPY;  Surgeon: Rogene Houston, MD;  Location: [facility];  Service: Endoscopy;  Laterality: N/A;  1200  . FLEXIBLE SIGMOIDOSCOPY N/A 08/09/2017   Procedure: FLEXIBLE SIGMOIDOSCOPY;  Surgeon: Rogene Houston, MD;  Location: [facility];  Service: Endoscopy;  Laterality: N/A;  1:00  . HERNIA REPAIR    . LIVER TRANSPLANT  [id number]  . POLYPECTOMY  08/09/2017   Procedure: POLYPECTOMY;  Surgeon: Rogene Houston, MD;  Location: [facility];  Service: Endoscopy;;  colon small bowel  . SPLENECTOMY  2006  . UPPER GASTROINTESTINAL ENDOSCOPY     Done at [facility]    Family History  Problem Relation Age of Onset  . Prostate cancer Father   . Colon cancer Father   . Colon cancer Sister   . Lung cancer Sister   . Healthy Son   . Alcohol abuse Brother      Social History:  reports that she has never smoked. She has never used smokeless tobacco. She reports that she does not drink alcohol or use drugs.   Medications Prior to Admission  Medication Sig Dispense Refill  . acetaminophen (TYLENOL) 325 MG tablet Take 650 mg by mouth every 6 (six) hours as needed for mild pain or moderate pain.     [name] alendronate (FOSAMAX) 70 MG tablet Take 1 tablet (70 mg total) by mouth every 7 (seven) days. Take with a full glass of water on an empty stomach. 12 tablet 2  . ALPRAZolam (XANAX) 0.5 MG tablet Take 1 tablet (0.5 mg total) by mouth at bedtime as needed. 30 tablet 5  . aspirin EC 81 MG tablet Take 81 mg by mouth daily.    [name] CALCIUM CITRATE PO Take 1,200 mg by mouth 2 (two) times daily.     . cephALEXin (KEFLEX) 500 MG capsule Take 500 mg by mouth 2 (two) times daily.    . Cyanocobalamin (VITAMIN B 12 PO) Take 1,000 mg by mouth daily.    . ferrous sulfate 325 (65 FE) MG tablet Take 325 mg by mouth 2 (two) times daily with a meal.     . levothyroxine (SYNTHROID) 125 MCG tablet Take 1 tablet (125 mcg total)  by mouth daily. 90 tablet 1  . losartan (COZAAR) 25 MG tablet TAKE 1 TABLET BY MOUTH ONCE DAILY 90 tablet 0  . magnesium oxide (MAG-OX) 400 MG tablet Take 400 mg by mouth daily.    . metoprolol succinate (TOPROL-XL) 25 MG 24 hr tablet TAKE 1 TABLET BY MOUTH ONCE DAILY 90 tablet 1  . Multiple Vitamins-Minerals (HAIR SKIN AND NAILS FORMULA PO) Take 1 tablet by mouth 2 (two) times daily.     . pantoprazole (PROTONIX) 40 MG tablet Take 1 tablet (40 mg total) by mouth daily before breakfast. 90 tablet 2  . propranolol (INDERAL) 80 MG tablet Take 80 mg by mouth daily.     . tacrolimus (PROGRAF) 1 MG capsule Take 2 mg by mouth 2 (two) times daily.     . ursodiol (ACTIGALL) 250 MG tablet Take 250 mg by mouth 2 (two) times daily.    . Vitamin D, Ergocalciferol, (DRISDOL) 50000 units CAPS capsule TAKE 1 CAPSULE BY MOUTH ONCE A WEEK 12 capsule 0  . vitamin E  (VITAMIN E) 400 UNIT capsule Take 400 Units by mouth daily.    [name] ZORTRESS 0.5 MG TABS Take 0.5 mg by mouth 2 (two) times daily.   11  . cyclobenzaprine (FLEXERIL) 5 MG tablet Take 1 tablet (5 mg total) by mouth 3 (three) times daily as needed for muscle spasms. (Patient taking differently: Take 5 mg by mouth 2 (two) times daily as needed for muscle spasms. ) 60 tablet 1  . fluorouracil (EFUDEX) 5 % cream Apply 1 application topically daily as needed (cancer spots).   0  . fluticasone (FLONASE) 50 MCG/ACT nasal spray Place 2 sprays into both nostrils daily. 16 g 6  . hydrOXYzine (VISTARIL) 25 MG capsule Take 25 mg by mouth 2 (two) times daily.     [name] loperamide (IMODIUM) 2 MG capsule TAKE 2 CAPSULES 4 TIMES DAILY AS NEEDED 90 capsule 1     Physical Exam: left shoulder with painful and restricted motion as noted at recent office visits  Vitals  Temp:  [97.9 F (36.6 C)] 97.9 F (36.6 C) (09/05 1108) Pulse Rate:  [66] 66 (09/05 1108) Resp:  [16] 16 (09/05 1108) BP: (158)/(79) 158/79 (09/05 1108) SpO2:  [98 %] 98 % (09/05 1108) Weight:  [64.4 kg] 64.4 kg (09/05 1108)  Assessment/Plan  Impression: left shoulder rotator cuff tear arthropathy  Plan of Action: Procedure(s): LEFT REVERSE SHOULDER ARTHROPLASTY  Cassidy Barber 07/17/2018, 11:45 AM Contact # [phone]

## 2018-07-18 ENCOUNTER — Encounter (HOSPITAL_COMMUNITY): Payer: Self-pay | Admitting: Orthopedic Surgery

## 2018-07-18 NOTE — Plan of Care (Signed)

## 2018-07-18 NOTE — Evaluation (Signed)
Occupational Therapy Evaluation and Discharge Patient Details Name: Cassidy Barber MRN: [id number] DOB: 1951/05/20 Today's Date: 07/18/2018    History of Present Illness  Left reverse shoulder arthroplasty    Clinical Impression   This 67 yo female admitted and underwent above presents to acute OT with all education completed, we will D/C from acute OT.    Follow Up Recommendations  No OT follow up    Equipment Recommendations  None recommended by OT       Precautions / Restrictions Precautions Precautions: Shoulder Type of Shoulder Precautions: If sitting in controlled environment, ok to come out of sling to give neck a break. Please sleep in it to protect until follow up in office. Ok to instruct Pendulums and lap slides as exercises. Ok to use operative arm within the following parameters for ADL purposes- Ok for PROM, AAROM, AROM within pain tolerance and within the following ROM ER 20, ABD 45, FE 60. No resisted internal rotation and no exercises for internal rotation. AROM elbow, wrist, hand to tolerance-YES Shoulder Interventions: Shoulder sling/immobilizer;Off for dressing/bathing/exercises Precaution Booklet Issued: Yes (comment) Restrictions Weight Bearing Restrictions: Yes LUE Weight Bearing: Non weight bearing      Mobility Bed Mobility Overal bed mobility: Independent                Transfers Overall transfer level: Independent                        ADL either performed or assessed with clinical judgement         Vision Patient Visual Report: No change from baseline              Pertinent Vitals/Pain Pain Assessment: No/denies pain(block still effective)     Hand Dominance Right   Extremity/Trunk Assessment Upper Extremity Assessment Upper Extremity Assessment: LUE deficits/detail LUE Deficits / Details: Block only starting to wear off. Has some finger movement for flexion and that is it; feels numb LUE Sensation: decreased light  touch LUE Coordination: decreased fine motor;decreased gross motor           Communication Communication Communication: No difficulties   Cognition Arousal/Alertness: Awake/alert Behavior During Therapy: WFL for tasks assessed/performed Overall Cognitive Status: Within Functional Limits for tasks assessed                                        Exercises Other Exercises Other Exercises: Pt's block is only minimally wearing off. Has minimal finger flexion. I instructed pt and husband in PROM for elbow, forearm, wrist, hand and lap slides until block is worn off and pt can do for herself. I instructed them also in pendulums with pt demonstraiting with good arm (due to operated arm still numb and heavy)--informed her and husband not to do these unitl block has fully worn off.  Other Exercises: They are aware pt is to do exercises 3x/day 10 reps each   Shoulder Instructions Shoulder Instructions Donning/doffing shirt without moving shoulder: Caregiver independent with task Method for sponge bathing under operated UE: Caregiver independent with task Donning/doffing sling/immobilizer: Caregiver independent with task Correct positioning of sling/immobilizer: Caregiver independent with task Pendulum exercises (written home exercise program): Min-guard ROM for elbow, wrist and digits of operated UE: Caregiver independent with task Sling wearing schedule (on at all times/off for ADL's): Independent Proper positioning of operated UE when showering: Independent Dressing change: (  NA) Positioning of UE while sleeping: [place] expects to be discharged to:: Private residence Living Arrangements: Spouse/significant other Available Help at Discharge: Family;Available 24 hours/day                   Bathroom Toilet: Standard     Home Equipment: None          Prior Functioning/Environment Level of Independence: Independent                  OT Problem List: Decreased strength;Decreased range of motion;Impaired UE functional use         OT Goals(Current goals can be found in the care plan section) Acute Rehab OT Goals Patient Stated Goal: home today  OT Frequency:                AM-PAC PT "6 Clicks" Daily Activity     Outcome Measure Help from another person eating meals?: A Little Help from another person taking care of personal grooming?: A Little Help from another person toileting, which includes using toliet, bedpan, or urinal?: A Little Help from another person bathing (including washing, rinsing, drying)?: A Little Help from another person to put on and taking off regular upper body clothing?: A Lot Help from another person to put on and taking off regular lower body clothing?: A Lot 6 Click Score: 16   End of Session Equipment Utilized During Treatment: (sling)  Activity Tolerance: Patient tolerated treatment well Patient left: in bed;with family/visitor present                   Time: 8022-3361 OT Time Calculation (min): 59 min Charges:  OT General Charges $OT Visit: 1 Visit OT Evaluation $OT Eval Moderate Complexity: 1 Mod OT Treatments $Self Care/Home Management : 8-22 mins $Therapeutic Exercise: 23-37 mins  [address], OTR/L Acute [employer] Pager [phone] Office [phone]

## 2018-07-18 NOTE — Progress Notes (Signed)
Cassidy Barber  MRN: [id number] DOB/Age: 01-25-1951 67 y.o. [facility] Procedure: Procedure(s) (LRB): LEFT REVERSE SHOULDER ARTHROPLASTY (Left)     Subjective: Had a great deal of pain yesterday, when asked where she states it was a pulling sensation in base of neck. It resolved with pain meds. Today her block is still in effect. No complaints of pain  Vital Signs Temp:  [97 F (36.1 C)-97.9 F (36.6 C)] 97.9 F (36.6 C) (09/06 0342) Pulse Rate:  [65-98] 75 (09/06 0342) Resp:  [12-19] 18 (09/06 0342) BP: (126-181)/(71-130) 126/72 (09/06 0342) SpO2:  [92 %-100 %] 97 % (09/06 0342) Weight:  [64.4 kg] 64.4 kg (09/05 1108)  Lab Results No results for input(s): WBC, HGB, HCT, PLT in the last 72 hours. BMET No results for input(s): NA, K, CL, CO2, GLUCOSE, BUN, CREATININE, CALCIUM in the last 72 hours. INR  Date Value Ref Range Status  06/07/2014 1.1 0.8 - 1.2 Final    Comment:    Reference interval is for non-anticoagulated patients. Suggested INR therapeutic range for Vitamin K antagonist therapy:    Standard Dose (moderate intensity                   therapeutic range):       2.0 - 3.0    Higher intensity therapeutic range       2.5 - 3.5     Exam Block still in effect Dressing dry        Plan DC after OT  Jenetta Loges PA-C  07/18/2018, 8:44 AM Contact # [phone]

## 2018-07-18 NOTE — Anesthesia Postprocedure Evaluation (Signed)
Anesthesia Post Note  Patient: Cassidy Barber  Procedure(s) Performed: LEFT REVERSE SHOULDER ARTHROPLASTY (Left Shoulder)     Patient location during evaluation: PACU Anesthesia Type: General Level of consciousness: awake and alert Pain management: pain level controlled Vital Signs Assessment: post-procedure vital signs reviewed and stable Respiratory status: spontaneous breathing, nonlabored ventilation, respiratory function stable and patient connected to nasal cannula oxygen Cardiovascular status: blood pressure returned to baseline and stable Postop Assessment: no apparent nausea or vomiting Anesthetic complications: no    Last Vitals:  Vitals:   07/17/18 2029 07/18/18 0342  BP: 136/83 126/72  Pulse: 84 75  Resp: 18 18  Temp: (!) 36.3 C 36.6 C  SpO2: 92% 97%    Last Pain:  Vitals:   07/18/18 0342  TempSrc: Oral  PainSc:                  [name]

## 2018-07-21 NOTE — Discharge Summary (Signed)
PATIENT ID:      Cassidy Barber  MRN:     [id number] DOB/AGE:    [birth year] / 67 y.o.     DISCHARGE SUMMARY  ADMISSION DATE:    07/17/2018 DISCHARGE DATE:  07/18/2018  ADMISSION DIAGNOSIS: left shoulder rotator cuff tear arthropathy Past Medical History:  Diagnosis Date  . Anemia of chronic disease 10/26/2013  . Hernia of unspecified site of abdominal cavity without mention of obstruction or gangrene   . Hypertension   . Liver disease   . Lynch syndrome   . Lynch syndrome   . Primary biliary cirrhosis ([place]) 10/26/2013  . Thyroid disease     DISCHARGE DIAGNOSIS:   Active Problems:   S/p reverse total shoulder arthroplasty   PROCEDURE: Procedure(s): LEFT REVERSE SHOULDER ARTHROPLASTY on 07/17/2018  CONSULTS:    HISTORY:  See H&P in chart.  HOSPITAL COURSE:  Cassidy Barber is a 67 y.o. admitted on 07/17/2018 with a diagnosis of left shoulder rotator cuff tear arthropathy.  They were brought to the operating room on 07/17/2018 and underwent Procedure(s): LEFT REVERSE SHOULDER ARTHROPLASTY.    They were given perioperative antibiotics:  Anti-infectives (From admission, onward)   Start     Dose/Rate Route Frequency Ordered Stop   07/17/18 1103  ceFAZolin (ANCEF) 2-4 GM/100ML-% IVPB    Note to Pharmacy:  Ardine Eng   : cabinet override      07/17/18 1103 07/17/18 1228   07/17/18 1100  ceFAZolin (ANCEF) IVPB 2g/100 mL premix     2 g 200 mL/hr over 30 Minutes Intravenous On call to O.R. 07/17/18 1053 07/17/18 1258    .  Patient underwent the above named procedure and tolerated it well. The following day they were hemodynamically stable and pain was controlled on oral analgesics. They were neurovascularly intact to the operative extremity. OT was ordered and worked with patient per protocol. They were medically and orthopaedically stable for discharge on 07/18/2018.    DIAGNOSTIC STUDIES:  RECENT RADIOGRAPHIC STUDIES :  No results found.  RECENT VITAL SIGNS:  No data  found.[name]  RECENT EKG RESULTS:    Orders placed or performed in visit on 06/13/18  . EKG 12-Lead    DISCHARGE INSTRUCTIONS:    DISCHARGE MEDICATIONS:   Allergies as of 07/18/2018      Reactions   Ciprofloxacin Itching   Codeine Nausea Only   Oxycodone Nausea Only      Medication List    STOP taking these medications   cephALEXin 500 MG capsule Commonly known as:  KEFLEX   ZORTRESS 0.5 MG Tabs Generic drug:  Everolimus     TAKE these medications   acetaminophen 325 MG tablet Commonly known as:  TYLENOL Take 650 mg by mouth every 6 (six) hours as needed for mild pain or moderate pain.   alendronate 70 MG tablet Commonly known as:  FOSAMAX Take 1 tablet (70 mg total) by mouth every 7 (seven) days. Take with a full glass of water on an empty stomach.   ALPRAZolam 0.5 MG tablet Commonly known as:  XANAX Take 1 tablet (0.5 mg total) by mouth at bedtime as needed.   aspirin EC 81 MG tablet Take 81 mg by mouth daily.   CALCIUM CITRATE PO Take 1,200 mg by mouth 2 (two) times daily.   cyclobenzaprine 5 MG tablet Commonly known as:  FLEXERIL Take 1 tablet (5 mg total) by mouth 3 (three) times daily as needed for muscle spasms. What changed:  when to take  this   ferrous sulfate 325 (65 FE) MG tablet Take 325 mg by mouth 2 (two) times daily with a meal.   fluorouracil 5 % cream Commonly known as:  EFUDEX Apply 1 application topically daily as needed (cancer spots).   fluticasone 50 MCG/ACT nasal spray Commonly known as:  FLONASE Place 2 sprays into both nostrils daily.   HAIR SKIN AND NAILS FORMULA PO Take 1 tablet by mouth 2 (two) times daily.   hydrOXYzine 25 MG capsule Commonly known as:  VISTARIL Take 25 mg by mouth 2 (two) times daily.   levothyroxine 125 MCG tablet Commonly known as:  SYNTHROID, LEVOTHROID Take 1 tablet (125 mcg total) by mouth daily.   loperamide 2 MG capsule Commonly known as:  IMODIUM TAKE 2 CAPSULES 4 TIMES DAILY AS NEEDED    losartan 25 MG tablet Commonly known as:  COZAAR TAKE 1 TABLET BY MOUTH ONCE DAILY   magnesium oxide 400 MG tablet Commonly known as:  MAG-OX Take 400 mg by mouth daily.   metoprolol succinate 25 MG 24 hr tablet Commonly known as:  TOPROL-XL TAKE 1 TABLET BY MOUTH ONCE DAILY   pantoprazole 40 MG tablet Commonly known as:  PROTONIX Take 1 tablet (40 mg total) by mouth daily before breakfast.   propranolol 80 MG tablet Commonly known as:  INDERAL Take 80 mg by mouth daily.   tacrolimus 1 MG capsule Commonly known as:  PROGRAF Take 2 mg by mouth 2 (two) times daily.   ursodiol 250 MG tablet Commonly known as:  ACTIGALL Take 250 mg by mouth 2 (two) times daily.   VITAMIN B 12 PO Take 1,000 mg by mouth daily.   Vitamin D (Ergocalciferol) 50000 units Caps capsule Commonly known as:  DRISDOL TAKE 1 CAPSULE BY MOUTH ONCE A WEEK   vitamin E 400 UNIT capsule Generic drug:  vitamin E Take 400 Units by mouth daily.       FOLLOW UP VISIT:   Follow-up Information    Justice Britain, MD.   Specialty:  Orthopedic Surgery Why:  call to be seen in 10-14 days Contact information: [address] [place] [address] [phone]           DISCHARGE NA:TFTD   DISCHARGE CONDITION:  Cassidy Barber for Dr. Justice Britain 07/21/2018, 5:13 PM

## 2018-07-25 DIAGNOSIS — Z944 Liver transplant status: Secondary | ICD-10-CM | POA: Diagnosis not present

## 2018-07-31 ENCOUNTER — Telehealth (INDEPENDENT_AMBULATORY_CARE_PROVIDER_SITE_OTHER): Payer: Self-pay | Admitting: *Deleted

## 2018-07-31 NOTE — Telephone Encounter (Signed)
agree

## 2018-07-31 NOTE — Telephone Encounter (Signed)
Referring MD/PCP: christy hawks   Procedure: flex sig & egd  Reason/Indication:  Hx colon ca, hx polyps, lynch syndrome, GERD  Has patient had this procedure before?  Yes FS (2018) & EGD (2015)  If so, when, by whom and where?    Is there a family history of colon cancer?  Yes, father & sister  Who?  What age when diagnosed?    Is patient diabetic?   no      Does patient have prosthetic heart valve or mechanical valve?  no  Do you have a pacemaker?  no  Has patient ever had endocarditis? no  Has patient had joint replacement within last 12 months?  no  Is patient constipated or do they take laxatives? no  Does patient have a history of alcohol/drug use?  no  Is patient on blood thinner such as Coumadin, Plavix and/or Aspirin? ues  Medications: see epic  Allergies: see epic  Medication Adjustment per Dr Lindi Adie, NP: asa 2 days & iron 10 days  Procedure date & time: 08/21/18 at 930

## 2018-08-01 DIAGNOSIS — M19112 Post-traumatic osteoarthritis, left shoulder: Secondary | ICD-10-CM | POA: Diagnosis not present

## 2018-08-01 DIAGNOSIS — Z471 Aftercare following joint replacement surgery: Secondary | ICD-10-CM | POA: Diagnosis not present

## 2018-08-05 ENCOUNTER — Ambulatory Visit (INDEPENDENT_AMBULATORY_CARE_PROVIDER_SITE_OTHER): Payer: Medicare Other | Admitting: Family Medicine

## 2018-08-05 ENCOUNTER — Encounter: Payer: Self-pay | Admitting: Family Medicine

## 2018-08-05 VITALS — BP 138/88 | HR 89 | Temp 98.1°F | Ht 64.0 in | Wt 142.0 lb

## 2018-08-05 DIAGNOSIS — R21 Rash and other nonspecific skin eruption: Secondary | ICD-10-CM | POA: Diagnosis not present

## 2018-08-05 MED ORDER — CETIRIZINE HCL 10 MG PO TABS: 5.0000 mg | ORAL_TABLET | Freq: Every day | ORAL | 11 refills | Status: DC

## 2018-08-05 NOTE — Progress Notes (Signed)
Subjective: CC: rash PCP: [name], FNP ZOX:WRUE B Cassidy Barber is a 67 y.o. female presenting to clinic today for:  1. Pruritic rash-- Patient has had a rash going on for the last several months. She states that many mornings she wakes up scratching. The rash comes up on her bottom and back and inside of her legs, as well as her hands. She takes a Benadryl every morning, and the rash goes away within 30 minutes. She has noticed a "lump in her throat" over the last few days, when she wakes up. She has not changed her detergents or soaps. The only medication that has changed is one of her anti-rejection medications about 2 months ago, but she has taken this medication in the past and has not had a reaction to it. She has not had a fever, any tick bites, upper respiratory symptoms, or dyspnea. She does not take a long-term antihistamine medication.   ROS: Per HPI  Allergies  Allergen Reactions  . Ciprofloxacin Itching  . Codeine Nausea Only  . Oxycodone Nausea Only   Past Medical History:  Diagnosis Date  . Anemia of chronic disease 10/26/2013  . Hernia of unspecified site of abdominal cavity without mention of obstruction or gangrene   . Hypertension   . Liver disease   . Lynch syndrome   . Lynch syndrome   . Primary biliary cirrhosis ([place]) 10/26/2013  . Thyroid disease     Current Outpatient Medications:  .  acetaminophen (TYLENOL) 325 MG tablet, Take 650 mg by mouth every 6 (six) hours as needed for mild pain or moderate pain. , Disp: , Rfl:  .  alendronate (FOSAMAX) 70 MG tablet, Take 1 tablet (70 mg total) by mouth every 7 (seven) days. Take with a full glass of water on an empty stomach., Disp: 12 tablet, Rfl: 2 .  ALPRAZolam (XANAX) 0.5 MG tablet, Take 1 tablet (0.5 mg total) by mouth at bedtime as needed., Disp: 30 tablet, Rfl: 5 .  aspirin EC 81 MG tablet, Take 81 mg by mouth daily., Disp: , Rfl:  .  CALCIUM CITRATE PO, Take 1,200 mg by mouth 2 (two) times daily. , Disp: ,  Rfl:  .  cetirizine (ZYRTEC) 10 MG tablet, Take 0.5 tablets (5 mg total) by mouth at bedtime., Disp: 30 tablet, Rfl: 11 .  Cyanocobalamin (VITAMIN B 12 PO), Take 1,000 mg by mouth daily., Disp: , Rfl:  .  cyclobenzaprine (FLEXERIL) 5 MG tablet, Take 1 tablet (5 mg total) by mouth 3 (three) times daily as needed for muscle spasms. (Patient taking differently: Take 5 mg by mouth 2 (two) times daily as needed for muscle spasms. ), Disp: 60 tablet, Rfl: 1 .  ferrous sulfate 325 (65 FE) MG tablet, Take 325 mg by mouth 2 (two) times daily with a meal. , Disp: , Rfl:  .  fluorouracil (EFUDEX) 5 % cream, Apply 1 application topically daily as needed (cancer spots). , Disp: , Rfl: 0 .  fluticasone (FLONASE) 50 MCG/ACT nasal spray, Place 2 sprays into both nostrils daily., Disp: 16 g, Rfl: 6 .  hydrOXYzine (VISTARIL) 25 MG capsule, Take 25 mg by mouth 2 (two) times daily. , Disp: , Rfl:  .  levothyroxine (SYNTHROID) 125 MCG tablet, Take 1 tablet (125 mcg total) by mouth daily., Disp: 90 tablet, Rfl: 1 .  loperamide (IMODIUM) 2 MG capsule, TAKE 2 CAPSULES 4 TIMES DAILY AS NEEDED, Disp: 90 capsule, Rfl: 1 .  losartan (COZAAR) 25 MG tablet, TAKE  1 TABLET BY MOUTH ONCE DAILY, Disp: 90 tablet, Rfl: 0 .  magnesium oxide (MAG-OX) 400 MG tablet, Take 400 mg by mouth daily., Disp: , Rfl:  .  metoprolol succinate (TOPROL-XL) 25 MG 24 hr tablet, TAKE 1 TABLET BY MOUTH ONCE DAILY, Disp: 90 tablet, Rfl: 1 .  Multiple Vitamins-Minerals (HAIR SKIN AND NAILS FORMULA PO), Take 1 tablet by mouth 2 (two) times daily. , Disp: , Rfl:  .  pantoprazole (PROTONIX) 40 MG tablet, Take 1 tablet (40 mg total) by mouth daily before breakfast., Disp: 90 tablet, Rfl: 2 .  propranolol (INDERAL) 80 MG tablet, Take 80 mg by mouth daily. , Disp: , Rfl:  .  tacrolimus (PROGRAF) 1 MG capsule, Take 2 mg by mouth 2 (two) times daily. , Disp: , Rfl:  .  ursodiol (ACTIGALL) 250 MG tablet, Take 250 mg by mouth 2 (two) times daily., Disp: , Rfl:  .   Vitamin D, Ergocalciferol, (DRISDOL) 50000 units CAPS capsule, TAKE 1 CAPSULE BY MOUTH ONCE A WEEK, Disp: 12 capsule, Rfl: 0 .  vitamin E (VITAMIN E) 400 UNIT capsule, Take 400 Units by mouth daily., Disp: , Rfl:  Social History   Socioeconomic History  . Marital status: Married    Spouse name: Not on file  . Number of children: 1  . Years of education: Not on file  . Highest education level: 12th grade  Occupational History  . Occupation: Disability    Employer: [employer]    Comment: Multimedia programmer  Social Needs  . Financial resource strain: Not hard at all  . Food insecurity:    Worry: Never true    Inability: Never true  . Transportation needs:    Medical: No    Non-medical: No  Tobacco Use  . Smoking status: Never Smoker  . Smokeless tobacco: Never Used  Substance and Sexual Activity  . Alcohol use: No    Alcohol/week: 0.0 standard drinks  . Drug use: No  . Sexual activity: Yes    Birth control/protection: None  Lifestyle  . Physical activity:    Days per week: 3 days    Minutes per session: 60 min  . Stress: Not at all  Relationships  . Social connections:    Talks on phone: More than three times a week    Gets together: More than three times a week    Attends religious service: More than 4 times per year    Active member of club or organization: Yes    Attends meetings of clubs or organizations: More than 4 times per year    Relationship status: Married  . Intimate partner violence:    Fear of current or ex partner: No    Emotionally abused: No    Physically abused: No    Forced sexual activity: No  Other Topics Concern  . Not on file  Social History Narrative   Patient lives in a two story home with her husband. She has an adult son. She is retired from being an Web designer for 30 years.    Family History  Problem Relation Age of Onset  . Prostate cancer Father   . Colon cancer Father   . Colon cancer Sister   . Lung cancer  Sister   . Healthy Son   . Alcohol abuse Brother     Objective: Office vital signs reviewed. BP 138/88   Pulse 89   Temp 98.1 F (36.7 C) (Oral)   Ht 5\' 4"  (1.626 m)  Wt 64.4 kg   BMI 24.37 kg/m   Physical Examination:  General: Awake, alert, well nourished, No acute distress HEENT: Normal    Neck: No masses palpated. No lymphadenopathy    Ears: Tympanic membranes intact, normal light reflex, no erythema, no bulging    Eyes: PERRLA, extraocular membranes intact, sclera white    Nose: nasal turbinates moist, no nasal discharge    Throat: moist mucus membranes, no erythema, no tonsillar exudate.  Airway is patent Cardio: regular rate and rhythm, S1S2 heard, no murmurs appreciated Pulm: clear to auscultation bilaterally, no wheezes, rhonchi or rales; normal work of breathing on room air GI: soft, non-tender, non-distended, bowel sounds present x4, no hepatomegaly, no  Skin: dry; intact; currently she does not have a rash, when present it is a slightly raised, wheal-like rash   Assessment/ Plan: 67 y.o. female   Non-specific pruritic rash Talked with patient about starting Zyrtec daily to give long-term antihistamine coverage. She can continue taking Benadryl as needed. She was told to come back if her rash worsens, she has trouble breathing, has trouble swallowing, or starts to have a fever. Patient told to follow up with her specialist prescribing her anti-rejection medications to talk about skin reaction. Follow up to office in 4 to 6 weeks with PCP for rash and continued sleep issues.  No orders of the defined types were placed in this encounter.  Meds ordered this encounter  Medications  . cetirizine (ZYRTEC) 10 MG tablet    Sig: Take 0.5 tablets (5 mg total) by mouth at bedtime.    Dispense:  30 tablet    Refill:  [facility] PA-S Patient seen and examined in the presence of Ronnie Doss, DO

## 2018-08-05 NOTE — Patient Instructions (Signed)
Make sure that you ask your specialist about the rash as well.  This seems to be some type of contact since is only in the rear distribution.  I sent in Zyrtec.  Take half a tablet every night at bedtime.  This may cause you to be sleepy and may help with your sleep.  But you may also add in melatonin if you find that you need something extra.  Follow-up with Cassidy Barber in the next 4 to 6 weeks for sleep and to follow-up in your rash.   Rash A rash is a change in the color of the skin. A rash can also change the way your skin feels. There are many different conditions and factors that can cause a rash. Follow these instructions at home: Pay attention to any changes in your symptoms. Follow these instructions to help with your condition: Medicine Take or apply over-the-counter and prescription medicines only as told by your doctor. These may include:  Corticosteroid cream.  Anti-itch lotions.  Oral antihistamines.  Skin Care  Put cool compresses on the affected areas.  Try taking a bath with: ? Epsom salts. Follow the instructions on the packaging. You can get these at your local pharmacy or grocery store. ? Baking soda. Pour a small amount into the bath as told by your doctor. ? Colloidal oatmeal. Follow the instructions on the packaging. You can get this at your local pharmacy or grocery store.  Try putting baking soda paste onto your skin. Stir water into baking soda until it gets like a paste.  Do not scratch or rub your skin.  Avoid covering the rash. Make sure the rash is exposed to air as much as possible. General instructions  Avoid hot showers or baths, which can make itching worse. A cold shower may help.  Avoid scented soaps, detergents, and perfumes. Use gentle soaps, detergents, perfumes, and other cosmetic products.  Avoid anything that causes your rash. Keep a journal to help track what causes your rash. Write down: ? What you eat. ? What cosmetic products you  use. ? What you drink. ? What you wear. This includes jewelry.  Keep all follow-up visits as told by your doctor. This is important. Contact a doctor if:  You sweat at night.  You lose weight.  You pee (urinate) more than normal.  You feel weak.  You throw up (vomit).  Your skin or the whites of your eyes look yellow (jaundice).  Your skin: ? Tingles. ? Is numb.  Your rash: ? Does not go away after a few days. ? Gets worse.  You are: ? More thirsty than normal. ? More tired than normal.  You have: ? New symptoms. ? Pain in your belly (abdomen). ? A fever. ? Watery poop (diarrhea). Get help right away if:  Your rash covers all or most of your body. The rash may or may not be painful.  You have blisters that: ? Are on top of the rash. ? Grow larger. ? Grow together. ? Are painful. ? Are inside your nose or mouth.  You have a rash that: ? Looks like purple pinprick-sized spots all over your body. ? Has a "bull's eye" or looks like a target. ? Is red and painful, causes your skin to peel, and is not from being in the sun too long. This information is not intended to replace advice given to you by your health care provider. Make sure you discuss any questions you have with your health care  provider. Document Released: 04/16/2008 Document Revised: 04/05/2016 Document Reviewed: 03/16/2015 Elsevier Interactive Patient Education  2018 [employer].

## 2018-08-06 ENCOUNTER — Ambulatory Visit: Payer: Medicare Other | Attending: Orthopedic Surgery | Admitting: Physical Therapy

## 2018-08-06 ENCOUNTER — Encounter: Payer: Self-pay | Admitting: Physical Therapy

## 2018-08-06 ENCOUNTER — Other Ambulatory Visit: Payer: Self-pay

## 2018-08-06 DIAGNOSIS — M25512 Pain in left shoulder: Secondary | ICD-10-CM | POA: Insufficient documentation

## 2018-08-06 DIAGNOSIS — M25612 Stiffness of left shoulder, not elsewhere classified: Secondary | ICD-10-CM | POA: Insufficient documentation

## 2018-08-06 DIAGNOSIS — M6281 Muscle weakness (generalized): Secondary | ICD-10-CM | POA: Diagnosis not present

## 2018-08-06 NOTE — Therapy (Addendum)
[facility] [place], [place], [address] Phone: [phone]   Fax:  [phone]  Physical Therapy Evaluation  Patient Details  Name: Cassidy Barber MRN: [id number] Date of Birth: [birth year] Referring Provider: Jenetta Loges, PA-C   Encounter Date: 67/25/2019  PT End of Session - 08/06/18 0915    Visit Number  1    Number of Visits  18    Date for PT Re-Evaluation  09/17/18    PT Start Time  0906    PT Stop Time  0945    PT Time Calculation (min)  39 min    Activity Tolerance  Patient tolerated treatment well    Behavior During Therapy  [facility] for tasks assessed/performed       Past Medical History:  Diagnosis Date  . Anemia of chronic disease 10/26/2013  . Hernia of unspecified site of abdominal cavity without mention of obstruction or gangrene   . Hypertension   . Liver disease   . Lynch syndrome   . Lynch syndrome   . Primary biliary cirrhosis ([place]) 10/26/2013  . Thyroid disease     Past Surgical History:  Procedure Laterality Date  . ABDOMINAL HERNIA REPAIR     Patient's states that she has had 8- 9 hernia surgeries  . ABDOMINAL HYSTERECTOMY    . CHOLECYSTECTOMY  2007  . COLON RESECTION    . COLON SURGERY  2008   Done at [facility]  . COLONOSCOPY     Done at [facility]  . EYE SURGERY     lasix  . FLEXIBLE SIGMOIDOSCOPY N/A 10/20/2015   Procedure: FLEXIBLE SIGMOIDOSCOPY;  Surgeon: Rogene Houston, MD;  [facility];  Service: Endoscopy;  Laterality: N/A;  13 - Dr Laural Golden has meeting until 1:00  . FLEXIBLE SIGMOIDOSCOPY N/A 07/11/2016   Procedure: FLEXIBLE SIGMOIDOSCOPY;  Surgeon: Rogene Houston, MD;  [facility];  Service: Endoscopy;  Laterality: N/A;  1200  . FLEXIBLE SIGMOIDOSCOPY N/A 08/09/2017   Procedure: FLEXIBLE SIGMOIDOSCOPY;  Surgeon: Rogene Houston, MD;  [facility];  Service: Endoscopy;  Laterality: N/A;  1:00  . HERNIA REPAIR    . LIVER TRANSPLANT  [id number]  . POLYPECTOMY   08/09/2017   Procedure: POLYPECTOMY;  Surgeon: Rogene Houston, MD;  [facility];  Service: Endoscopy;;  colon small bowel  . REVERSE SHOULDER ARTHROPLASTY Left 07/17/2018  . REVERSE SHOULDER ARTHROPLASTY Left 07/17/2018   Procedure: LEFT REVERSE SHOULDER ARTHROPLASTY;  Surgeon: Justice Britain, MD;  Location: [facility];  Service: Orthopedics;  Laterality: Left;  169min  . SPLENECTOMY  2006  . UPPER GASTROINTESTINAL ENDOSCOPY     Done at [facility]    There were no vitals filed for this visit.    Subjective: Patient arrives to physical therapy with reports of left shoulder pain secondary to Left reverse total shoulder replacement on 07/17/2018. Patient reports requiring assistance for ADLs from her husband. Patient reports she does not need to wear the sling per MD. Patient reports pain at worst is an 8/10 with movement and pain at best is a 7/10 with rest, ice, and tylenol. Patient reports most of her pain is in the anterior aspect of the left shoulder as well as in the L UT and around the scapula. Patient's goals are to decrease pain, improve movement, improve strength, and return to gardening.   [facility] PT Assessment - 08/06/18 0001      Assessment   Medical Diagnosis  left reverse total  shoulder replacement    Referring Provider  Jenetta Loges, PA-C    Onset Date/Surgical Date  07/17/18    Hand Dominance  Right    Next MD Visit  September 05, 2018    Prior Therapy  yes      Precautions   Precautions  Shoulder    Type of Shoulder Precautions  see media for protocol      Restrictions   Weight Bearing Restrictions  Yes    LUE Weight Bearing  Non weight bearing      Balance Screen   Has the patient fallen in the past 6 months  Yes    How many times?  1    Has the patient had a decrease in activity level because of a fear of falling?   No    Is the patient reluctant to leave their home because of a fear of falling?   No      Home Environment   Living Environment  Private residence       Prior Function   Level of Independence  Needs assistance with homemaking;Needs assistance with ADLs      Posture/Postural Control   Posture/Postural Control  Postural limitations    Postural Limitations  Rounded Shoulders;Forward head      ROM / Strength   AROM / PROM / Strength  PROM      PROM   PROM Assessment Site  Shoulder    Right/Left Shoulder  Left    Left Shoulder Flexion  89 Degrees    Left Shoulder External Rotation  12 Degrees      Palpation   Palpation comment  Tender to palpation to left anterior shoulder, L UT, and L medial border of the scapula                Objective measurements completed on examination: See above findings.      [facility] PT Treatment/Exercise - 08/06/18 0001      Modalities   Modalities  Electrical Stimulation;Vasopneumatic      Electrical Stimulation   Electrical Stimulation Location  Right shoulder    Electrical Stimulation Action  IFC    Electrical Stimulation Parameters  80-150 hz x10    Electrical Stimulation Goals  Pain      Vasopneumatic   Number Minutes Vasopneumatic   10 minutes    Vasopnuematic Location   Shoulder    Vasopneumatic Pressure  Low    Vasopneumatic Temperature   34             PT Education - 08/06/18 1735    Education Details  lateral and AP pendulums    Person(s) Educated  Patient    Methods  Explanation;Demonstration;Handout    Comprehension  Verbalized understanding       PT Short Term Goals - 04/28/18 1212      PT SHORT TERM GOAL #1   Title  STG's=LTG's.        PT Long Term Goals - 08/06/18 1740      PT LONG TERM GOAL #1   Title  Patient will be independent with HEP and its progression    Time  6    Period  Weeks    Status  New      PT LONG TERM GOAL #2   Title  Patient will demonstrate 130+ degrees of left shoulder flexion AROM to improve ability to perform functional tasks    Time  6    Period  Weeks  Status  New      PT LONG TERM GOAL #3   Title  Patient  will demonstrate 65+ degrees of left ER AROM to don/doff apparel.    Time  6    Period  Weeks    Status  New      PT LONG TERM GOAL #4   Title  Patient will demonstrate 4/5 or greater left shoulder MMT in all planes to improve stability during functional tasks.    Time  6    Period  Weeks    Status  New      PT LONG TERM GOAL #5   Title  Patient will report ability to perform all ADLs independently with less than 4/10 left shoulder pain.    Time  6    Period  Weeks    Status  New             Plan - 08/06/18 1736    Clinical Impression Statement  Patient is a 67 year old female who presents to physical therapy with decreased left shoulder PROM and left shoulder pain secondary to left reverse total shoulder replacement on 07/17/18. Patient tender to palpation at anterior aspect of the shoulder, in axilla, and in left UT and scapula. Patient educated on importance of following protocol and AROM should not be performed to allow healing. Patient reported understanding. Patient would benefit from skilled physical therapy to address deficits and address patient's goals.     History and Personal Factors relevant to plan of care:  L reverse total shoulder 07/17/18    Clinical Presentation  Stable    Clinical Decision Making  Low    Rehab Potential  Good    PT Frequency  3x / week    PT Duration  6 weeks    PT Treatment/Interventions  ADLs/Self Care Home Management;Cryotherapy;Electrical Stimulation;Ultrasound;Moist Heat;Therapeutic activities;Therapeutic exercise;Patient/family education;Manual techniques;Passive range of motion;Dry needling;Vasopneumatic Device;Neuromuscular re-education    PT Next Visit Plan  Review HEP, PROM to 90 deg flexion, ER to 30 degrees, IR to abdomen, and Abd to 60 degrees, per protocol, modalities PRN for pain relief    PT Home Exercise Plan  see patient education section    Consulted and Agree with Plan of Care  Patient       Patient will benefit from skilled  therapeutic intervention in order to improve the following deficits and impairments:  Decreased activity tolerance, Decreased range of motion, Decreased strength, Pain, Impaired UE functional use  Visit Diagnosis: Acute pain of left shoulder - Plan: PT plan of care cert/re-cert  Stiffness of left shoulder, not elsewhere classified - Plan: PT plan of care cert/re-cert  Muscle weakness (generalized) - Plan: PT plan of care cert/re-cert     Problem List Patient Active Problem List   Diagnosis Date Noted  . S/p reverse total shoulder arthroplasty 07/17/2018  . History of colon cancer 06/12/2018  . History of colonic polyps 06/12/2018  . 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  . Gastroesophageal reflux disease 06/12/2018  . Chronic diarrhea 06/26/2017  . Iron deficiency anemia 06/26/2017  . GAD (generalized anxiety disorder) 08/28/2016  . Vitamin D deficiency 08/28/2016  . GERD (gastroesophageal reflux disease) 08/28/2016  . Insomnia 08/28/2016  . Convulsions ([place]) 11/30/2015  . Liver transplant recipient [facility]) 11/30/2015  . Seizures ([place]) 09/27/2015  . Essential hypertension, benign 03/10/2015  . Hx of liver transplant ([place]) 12/08/2014  . Diarrhea 12/08/2014  . Colon cancer ([place]) 12/08/2014  . Hepatic encephalopathy ([place]) 10/26/2013  . Anemia  of chronic disease 10/26/2013  . Personal history of colonic polyps 12/27/2012  . PBC (primary biliary cirrhosis) 03/24/2012  . Hypothyroidism 03/24/2012  . Ventral hernia, recurrent 03/24/2012   Gabriela Eves, PT, DPT 08/06/2018, 5:58 PM  [facility] [address] [place], [place], [address] Phone: [phone]   Fax:  [phone]  Name: GIOVANNA KEMMERER MRN: [id number] Date of Birth: Mar 19, 1951

## 2018-08-07 ENCOUNTER — Other Ambulatory Visit: Payer: Self-pay | Admitting: Family Medicine

## 2018-08-07 ENCOUNTER — Encounter: Payer: Self-pay | Admitting: Physical Therapy

## 2018-08-07 ENCOUNTER — Ambulatory Visit: Payer: Medicare Other | Admitting: Physical Therapy

## 2018-08-07 DIAGNOSIS — M25612 Stiffness of left shoulder, not elsewhere classified: Secondary | ICD-10-CM | POA: Diagnosis not present

## 2018-08-07 DIAGNOSIS — M6281 Muscle weakness (generalized): Secondary | ICD-10-CM

## 2018-08-07 DIAGNOSIS — M25512 Pain in left shoulder: Secondary | ICD-10-CM | POA: Diagnosis not present

## 2018-08-07 NOTE — Therapy (Signed)
[facility] [place], [place], [address] Phone: [phone]   Fax:  [phone]  Physical Therapy Treatment  Patient Details  Name: FRANCENIA CHIMENTI MRN: [id number] Date of Birth: 04-Apr-1951 Referring Provider (PT): Jenetta Loges, [place]   Encounter Date: 08/07/2018  PT End of Session - 08/07/18 1509    Visit Number  2    Number of Visits  18    Date for PT Re-Evaluation  09/17/18    Authorization Type  FOTO every 5th visit, Progress note every 10th visit    PT Start Time  1345    PT Stop Time  1433    PT Time Calculation (min)  48 min    Activity Tolerance  Patient tolerated treatment well    Behavior During Therapy  [facility] for tasks assessed/performed       Past Medical History:  Diagnosis Date  . Anemia of chronic disease 10/26/2013  . Hernia of unspecified site of abdominal cavity without mention of obstruction or gangrene   . Hypertension   . Liver disease   . Lynch syndrome   . Lynch syndrome   . Primary biliary cirrhosis ([place]) 10/26/2013  . Thyroid disease     Past Surgical History:  Procedure Laterality Date  . ABDOMINAL HERNIA REPAIR     Patient's states that she has had 8- 9 hernia surgeries  . ABDOMINAL HYSTERECTOMY    . CHOLECYSTECTOMY  2007  . COLON RESECTION    . COLON SURGERY  2008   [facility]  . COLONOSCOPY     Done at [facility]  . EYE SURGERY     lasix  . FLEXIBLE SIGMOIDOSCOPY N/A 10/20/2015   Procedure: FLEXIBLE SIGMOIDOSCOPY;  Surgeon: Rogene Houston, MD;  [facility];  Service: Endoscopy;  Laterality: N/A;  40 - Dr Laural Golden has meeting until 1:00  . FLEXIBLE SIGMOIDOSCOPY N/A 07/11/2016   Procedure: FLEXIBLE SIGMOIDOSCOPY;  Surgeon: Rogene Houston, MD;  [facility];  Service: Endoscopy;  Laterality: N/A;  1200  . FLEXIBLE SIGMOIDOSCOPY N/A 08/09/2017   Procedure: FLEXIBLE SIGMOIDOSCOPY;  Surgeon: Rogene Houston, MD;  [facility];  Service: Endoscopy;   Laterality: N/A;  1:00  . HERNIA REPAIR    . LIVER TRANSPLANT  [id number]  . POLYPECTOMY  08/09/2017   Procedure: POLYPECTOMY;  Surgeon: Rogene Houston, MD;  [facility];  Service: Endoscopy;;  colon small bowel  . REVERSE SHOULDER ARTHROPLASTY Left 07/17/2018  . REVERSE SHOULDER ARTHROPLASTY Left 07/17/2018   Procedure: LEFT REVERSE SHOULDER ARTHROPLASTY;  Surgeon: Justice Britain, MD;  Location: [facility];  Service: Orthopedics;  Laterality: Left;  150min  . SPLENECTOMY  2006  . UPPER GASTROINTESTINAL ENDOSCOPY     Done at [facility]    There were no vitals filed for this visit.  Subjective Assessment - 08/07/18 2022    Subjective  Patient reports feeling a lot more tight today after yesterday's visit.    Pertinent History  L reverse total shoulder replacement 07/17/18, OA    Limitations  Lifting;House hold activities    Patient Stated Goals  use arm again and return to gardening    Currently in Pain?  Yes    Pain Score  8     Pain Location  Shoulder    Pain Orientation  Left;Anterior    Pain Descriptors / Indicators  Tightness    Pain Type  Surgical pain    Pain Onset  1 to 4  weeks ago    Pain Frequency  Constant         [facility] PT Assessment - 08/07/18 0001      Assessment   Medical Diagnosis  left reverse total shoulder replacement    Hand Dominance  Right    Next MD Visit  September 05, 2018    Prior Therapy  yes      Precautions   Precautions  Shoulder    Type of Shoulder Precautions  see media for protocol                   [facility] Adult PT Treatment/Exercise - 08/07/18 0001      Modalities   Modalities  Electrical Stimulation;Vasopneumatic      Electrical Stimulation   Electrical Stimulation Location  Right Shoulder    Electrical Stimulation Action  IFC    Electrical Stimulation Parameters  80-150 hz x15     Electrical Stimulation Goals  Pain      Vasopneumatic   Number Minutes Vasopneumatic   15 minutes    Vasopnuematic Location   Shoulder     Vasopneumatic Pressure  Low    Vasopneumatic Temperature   34      Manual Therapy   Manual Therapy  Passive ROM    Passive ROM  gentle PROM to L shoulder to no >90 degrees of flexion per protocol, IR and ER with oscillations to promote muscle relaxation                  PT Long Term Goals - 08/06/18 1740      PT LONG TERM GOAL #1   Title  Patient will be independent with HEP and its progression    Time  6    Period  Weeks    Status  New      PT LONG TERM GOAL #2   Title  Patient will demonstrate 130+ degrees of left shoulder flexion AROM to improve ability to perform functional tasks    Time  6    Period  Weeks    Status  New      PT LONG TERM GOAL #3   Title  Patient will demonstrate 65+ degrees of left ER AROM to don/doff apparel.    Time  6    Period  Weeks    Status  New      PT LONG TERM GOAL #4   Title  Patient will demonstrate 4/5 or greater left shoulder MMT in all planes to improve stability during functional tasks.    Time  6    Period  Weeks    Status  New      PT LONG TERM GOAL #5   Title  Patient will report ability to perform all ADLs independently with less than 4/10 left shoulder pain.    Time  6    Period  Weeks    Status  New            Plan - 08/07/18 1510    Clinical Impression Statement  Patient was able to tolerate treatment well despite initial report of 8/10 pain. Gentle PROM performed with intermittent oscillations to promote muscle relaxation. Patient instructed to continue to ice 20 mins at a time multiple times a day to stay ahead of the pain. Patient reported understanding. Normal response to modalities at end of session.     Clinical Presentation  Stable    Clinical Decision Making  Low    Rehab Potential  Good    PT Frequency  3x / week    PT Duration  6 weeks    PT Treatment/Interventions  ADLs/Self Care Home Management;Cryotherapy;Electrical Stimulation;Ultrasound;Moist Heat;Therapeutic activities;Therapeutic  exercise;Patient/family education;Manual techniques;Passive range of motion;Dry needling;Vasopneumatic Device;Neuromuscular re-education    PT Next Visit Plan  Review HEP, PROM to 90 deg flexion, ER to 30 degrees, IR to abdomen, and Abd to 60 degrees, per protocol, modalities PRN for pain relief    Consulted and Agree with Plan of Care  Patient       Patient will benefit from skilled therapeutic intervention in order to improve the following deficits and impairments:  Decreased activity tolerance, Decreased range of motion, Decreased strength, Pain, Impaired UE functional use  Visit Diagnosis: Acute pain of left shoulder  Stiffness of left shoulder, not elsewhere classified  Muscle weakness (generalized)     Problem List Patient Active Problem List   Diagnosis Date Noted  . S/p reverse total shoulder arthroplasty 07/17/2018  . History of colon cancer 06/12/2018  . History of colonic polyps 06/12/2018  . 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  . Gastroesophageal reflux disease 06/12/2018  . Chronic diarrhea 06/26/2017  . Iron deficiency anemia 06/26/2017  . GAD (generalized anxiety disorder) 08/28/2016  . Vitamin D deficiency 08/28/2016  . GERD (gastroesophageal reflux disease) 08/28/2016  . Insomnia 08/28/2016  . Convulsions ([place]) 11/30/2015  . Liver transplant recipient [facility]) 11/30/2015  . Seizures ([place]) 09/27/2015  . Essential hypertension, benign 03/10/2015  . Hx of liver transplant ([place]) 12/08/2014  . Diarrhea 12/08/2014  . Colon cancer ([place]) 12/08/2014  . Hepatic encephalopathy ([place]) 10/26/2013  . Anemia of chronic disease 10/26/2013  . Personal history of colonic polyps 12/27/2012  . PBC (primary biliary cirrhosis) 03/24/2012  . Hypothyroidism 03/24/2012  . Ventral hernia, recurrent 03/24/2012    Gabriela Eves, PT, DPT 08/07/2018, 8:26 PM  [facility] [address] [place], [place], [address] Phone: [phone]   Fax:   [phone]  Name: AMARILIS BELFLOWER MRN: [id number] Date of Birth: 1951-02-19

## 2018-08-11 ENCOUNTER — Encounter: Payer: Self-pay | Admitting: Physical Therapy

## 2018-08-11 ENCOUNTER — Ambulatory Visit (INDEPENDENT_AMBULATORY_CARE_PROVIDER_SITE_OTHER): Payer: Medicare Other | Admitting: Physician Assistant

## 2018-08-11 ENCOUNTER — Ambulatory Visit: Payer: Medicare Other | Admitting: Physical Therapy

## 2018-08-11 ENCOUNTER — Encounter: Payer: Self-pay | Admitting: Physician Assistant

## 2018-08-11 VITALS — BP 128/83 | HR 99 | Temp 97.7°F | Ht 64.0 in | Wt 142.0 lb

## 2018-08-11 DIAGNOSIS — M6281 Muscle weakness (generalized): Secondary | ICD-10-CM | POA: Diagnosis not present

## 2018-08-11 DIAGNOSIS — L509 Urticaria, unspecified: Secondary | ICD-10-CM | POA: Diagnosis not present

## 2018-08-11 DIAGNOSIS — M25612 Stiffness of left shoulder, not elsewhere classified: Secondary | ICD-10-CM | POA: Diagnosis not present

## 2018-08-11 DIAGNOSIS — Z298 Encounter for other specified prophylactic measures: Secondary | ICD-10-CM

## 2018-08-11 DIAGNOSIS — M25512 Pain in left shoulder: Secondary | ICD-10-CM | POA: Diagnosis not present

## 2018-08-11 DIAGNOSIS — I10 Essential (primary) hypertension: Secondary | ICD-10-CM

## 2018-08-11 MED ORDER — RANITIDINE HCL 150 MG PO TABS: 150.0000 mg | ORAL_TABLET | Freq: Two times a day (BID) | ORAL | 6 refills | Status: DC

## 2018-08-11 MED ORDER — LORATADINE 10 MG PO TABS: 10.0000 mg | ORAL_TABLET | Freq: Every day | ORAL | 3 refills | Status: DC

## 2018-08-11 MED ORDER — METHYLPREDNISOLONE ACETATE 80 MG/ML IJ SUSP
80.0000 mg | Freq: Once | INTRAMUSCULAR | Status: AC
  Administered 2018-08-11: 80 mg via INTRAMUSCULAR

## 2018-08-11 MED ORDER — LOSARTAN POTASSIUM 25 MG PO TABS: 25.0000 mg | ORAL_TABLET | Freq: Every day | ORAL | 0 refills | Status: DC

## 2018-08-11 MED ORDER — DIPHENHYDRAMINE HCL 50 MG/ML IJ SOLN: 50.0000 mg | Freq: Once | INTRAMUSCULAR | Status: DC

## 2018-08-11 NOTE — Patient Instructions (Signed)
Angioedema °Angioedema is sudden swelling in the body. The swelling can happen in any part of the body. It often happens on the skin and causes itchy, bumpy patches (hives) to form. °This condition may: °· Happen only one time. °· Happen more than one time. It may come back at random times. °· Keep coming back for a number of years. Someday it may stop coming back. ° °Follow these instructions at home: °· Take over-the-counter and prescription medicines only as told by your doctor. °· If you were given medicines for emergency allergy treatment, always carry them with you. °· Wear a medical bracelet as told by your doctor. °· Avoid the things that cause your attacks (triggers). °· If this condition was passed to you from your parents and you want to have kids, talk to your doctor. Your kids may also have this condition. °Contact a doctor if: °· You have another attack. °· Your attacks happen more often, even after you take steps to prevent them. °· This condition was passed to you by your parents and you want to have kids. °Get help right away if: °· Your mouth, tongue, or lips get very swollen. °· You have trouble breathing. °· You have trouble swallowing. °· You pass out (faint). °This information is not intended to replace advice given to you by your health care provider. Make sure you discuss any questions you have with your health care provider. °Document Released: 10/17/2009 Document Revised: 05/30/2016 Document Reviewed: 05/08/2016 °Elsevier Interactive Patient Education © 2018 [employer] ° ° ° ° ° °

## 2018-08-11 NOTE — Therapy (Addendum)
[facility] [place], [place], [address] Phone: [phone]   Fax:  [phone]  Physical Therapy Treatment  Patient Details  Name: Cassidy Barber MRN: [id number] Date of Birth: January 21, 1951 Referring Provider (PT): Jenetta Loges, [place]   Encounter Date: 08/11/2018  PT End of Session - 08/11/18 0945    Visit Number  3    Number of Visits  18    Date for PT Re-Evaluation  09/17/18    Authorization Type  FOTO every 5th visit, Progress note every 10th visit    PT Start Time  0900    PT Stop Time  0936   PROM only per patient request   PT Time Calculation (min)  36 min    Activity Tolerance  Patient tolerated treatment well    Behavior During Therapy  [facility] for tasks assessed/performed       Past Medical History:  Diagnosis Date  . Anemia of chronic disease 10/26/2013  . Hernia of unspecified site of abdominal cavity without mention of obstruction or gangrene   . Hypertension   . Liver disease   . Lynch syndrome   . Lynch syndrome   . Primary biliary cirrhosis ([place]) 10/26/2013  . Thyroid disease     Past Surgical History:  Procedure Laterality Date  . ABDOMINAL HERNIA REPAIR     Patient's states that she has had 8- 9 hernia surgeries  . ABDOMINAL HYSTERECTOMY    . CHOLECYSTECTOMY  2007  . COLON RESECTION    . COLON SURGERY  2008   Done at [facility]  . COLONOSCOPY     Done at [facility]  . EYE SURGERY     lasix  . FLEXIBLE SIGMOIDOSCOPY N/A 10/20/2015   Procedure: FLEXIBLE SIGMOIDOSCOPY;  Surgeon: Rogene Houston, MD;  [facility];  Service: Endoscopy;  Laterality: N/A;  22 - Dr Laural Golden has meeting until 1:00  . FLEXIBLE SIGMOIDOSCOPY N/A 07/11/2016   Procedure: FLEXIBLE SIGMOIDOSCOPY;  Surgeon: Rogene Houston, MD;  [facility];  Service: Endoscopy;  Laterality: N/A;  1200  . FLEXIBLE SIGMOIDOSCOPY N/A 08/09/2017   Procedure: FLEXIBLE SIGMOIDOSCOPY;  Surgeon: Rogene Houston, MD;  [facility];   Service: Endoscopy;  Laterality: N/A;  1:00  . HERNIA REPAIR    . LIVER TRANSPLANT  [id number]  . POLYPECTOMY  08/09/2017   Procedure: POLYPECTOMY;  Surgeon: Rogene Houston, MD;  [facility];  Service: Endoscopy;;  colon small bowel  . REVERSE SHOULDER ARTHROPLASTY Left 07/17/2018  . REVERSE SHOULDER ARTHROPLASTY Left 07/17/2018   Procedure: LEFT REVERSE SHOULDER ARTHROPLASTY;  Surgeon: Justice Britain, MD;  Location: [place];  Service: Orthopedics;  Laterality: Left;  172min  . SPLENECTOMY  2006  . UPPER GASTROINTESTINAL ENDOSCOPY     Done at [facility]    There were no vitals filed for this visit.  Subjective Assessment - 08/11/18 0942    Subjective  Patient reports shoulder pain is about 8/10. Requested PROM only as she has          [facility] PT Assessment - 08/11/18 0001      Assessment   Medical Diagnosis  left reverse total shoulder replacement    Onset Date/Surgical Date  07/17/18    Hand Dominance  Right    Next MD Visit  September 05, 2018    Prior Therapy  yes      Precautions   Precautions  Shoulder    Type of Shoulder Precautions  see  media for protocol                   [facility] Adult PT Treatment/Exercise - 08/11/18 0001      Modalities   Modalities  Electrical Stimulation;Vasopneumatic      Acupuncturist Location  Right Shoulder    Electrical Stimulation Action  IFC    Electrical Stimulation Parameters  80-150 hz x15 min    Electrical Stimulation Goals  Pain      Vasopneumatic   Number Minutes Vasopneumatic   15 minutes    Vasopnuematic Location   Shoulder    Vasopneumatic Pressure  Low    Vasopneumatic Temperature   34      Manual Therapy   Manual Therapy  Passive ROM    Passive ROM  gentle PROM to L shoulder to no >90 degrees of flexion per protocol, IR and ER with oscillations to promote muscle relaxation        NO MODALITIES PERFORMED THIS VISIT          PT Long Term Goals - 08/06/18 1740       PT LONG TERM GOAL #1   Title  Patient will be independent with HEP and its progression    Time  6    Period  Weeks    Status  New      PT LONG TERM GOAL #2   Title  Patient will demonstrate 130+ degrees of left shoulder flexion AROM to improve ability to perform functional tasks    Time  6    Period  Weeks    Status  New      PT LONG TERM GOAL #3   Title  Patient will demonstrate 65+ degrees of left ER AROM to don/doff apparel.    Time  6    Period  Weeks    Status  New      PT LONG TERM GOAL #4   Title  Patient will demonstrate 4/5 or greater left shoulder MMT in all planes to improve stability during functional tasks.    Time  6    Period  Weeks    Status  New      PT LONG TERM GOAL #5   Title  Patient will report ability to perform all ADLs independently with less than 4/10 left shoulder pain.    Time  6    Period  Weeks    Status  New            Plan - 08/11/18 1018    Clinical Impression Statement  Patient was able to tolerate treatment despite reports of 8/10 left shoulder pain. Patient noted with discomfort in anterior shoulder and around L breast with PROM but reports it is tolerable. Patient educated to take pain medication prior to the start of therapy to be ahead of the pain. Patient reported understanding. Patient requested to ice and TENS at home as she has hives and would like to go home.    Clinical Presentation  Stable    Clinical Decision Making  Low    Rehab Potential  Good    PT Frequency  3x / week    PT Duration  6 weeks    PT Treatment/Interventions  ADLs/Self Care Home Management;Cryotherapy;Electrical Stimulation;Ultrasound;Moist Heat;Therapeutic activities;Therapeutic exercise;Patient/family education;Manual techniques;Passive range of motion;Dry needling;Vasopneumatic Device;Neuromuscular re-education    PT Next Visit Plan  See protocol; add gentle pulleys and isometrics to abd, ER. PROM, modalities PRN  Consulted and Agree with Plan of  Care  Patient       Patient will benefit from skilled therapeutic intervention in order to improve the following deficits and impairments:  Decreased activity tolerance, Decreased range of motion, Decreased strength, Pain, Impaired UE functional use  Visit Diagnosis: Acute pain of left shoulder  Stiffness of left shoulder, not elsewhere classified  Muscle weakness (generalized)     Problem List Patient Active Problem List   Diagnosis Date Noted  . S/p reverse total shoulder arthroplasty 07/17/2018  . History of colon cancer 06/12/2018  . History of colonic polyps 06/12/2018  . 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  . Gastroesophageal reflux disease 06/12/2018  . Chronic diarrhea 06/26/2017  . Iron deficiency anemia 06/26/2017  . GAD (generalized anxiety disorder) 08/28/2016  . Vitamin D deficiency 08/28/2016  . GERD (gastroesophageal reflux disease) 08/28/2016  . Insomnia 08/28/2016  . Convulsions ([place]) 11/30/2015  . Liver transplant recipient [facility]) 11/30/2015  . Seizures ([place]) 09/27/2015  . Essential hypertension, benign 03/10/2015  . Hx of liver transplant ([place]) 12/08/2014  . Diarrhea 12/08/2014  . Colon cancer ([place]) 12/08/2014  . Hepatic encephalopathy ([place]) 10/26/2013  . Anemia of chronic disease 10/26/2013  . Personal history of colonic polyps 12/27/2012  . PBC (primary biliary cirrhosis) 03/24/2012  . Hypothyroidism 03/24/2012  . Ventral hernia, recurrent 03/24/2012   Gabriela Eves, PT, DPT 08/11/2018, 1:19 PM  [facility] [address] [place], [place], [address] Phone: [phone]   Fax:  [phone]  Name: MAISEN SCHMIT MRN: [id number] Date of Birth: 03/25/1951

## 2018-08-11 NOTE — Addendum Note (Signed)
Addended by: Liliane Bade on: 08/11/2018 08:40 AM   Modules accepted: Orders

## 2018-08-11 NOTE — Addendum Note (Signed)
Addended by: Liliane Bade on: 08/11/2018 08:41 AM   Modules accepted: Orders

## 2018-08-11 NOTE — Progress Notes (Signed)
BP 128/83   Pulse 99   Temp 97.7 F (36.5 C) (Oral)   Ht 5\' 4"  (1.626 m)   Wt 142 lb (64.4 kg)   BMI 24.37 kg/m    Subjective:    Patient ID: Cassidy Barber, female    DOB: 11-Dec-1950, 67 y.o.   MRN: [id number]  HPI: Cassidy Barber is a 67 y.o. female presenting on 08/11/2018 for Rash  She has been having recurrent episodes of red bumps that have severe itching. She has tried OTC meds that have not really helped. Yesterday did zyrtec and benadryl and this morning woke up with severe episode.  No new exposure to meds. Had shoulder replacement 3 weeks ago but the rash had begun before then. She is using a different anti rejection med during the surgical times  Past Medical History:  Diagnosis Date  . Anemia of chronic disease 10/26/2013  . Hernia of unspecified site of abdominal cavity without mention of obstruction or gangrene   . Hypertension   . Liver disease   . Lynch syndrome   . Lynch syndrome   . Primary biliary cirrhosis ([place]) 10/26/2013  . Thyroid disease    Relevant past medical, surgical, family and social history reviewed and updated as indicated. Interim medical history since our last visit reviewed. Allergies and medications reviewed and updated. DATA REVIEWED: CHART IN EPIC  Family History reviewed for pertinent findings.  Review of Systems  Constitutional: Negative.   HENT: Negative.   Eyes: Negative.   Respiratory: Negative.   Gastrointestinal: Negative.   Genitourinary: Negative.   Skin: Positive for color change and rash.    Allergies as of 08/11/2018      Reactions   Ciprofloxacin Itching   Codeine Nausea Only   Oxycodone Nausea Only      Medication List        Accurate as of 08/11/18  8:29 AM. Always use your most recent med list.          acetaminophen 325 MG tablet Commonly known as:  TYLENOL Take 650 mg by mouth every 6 (six) hours as needed for mild pain or moderate pain.   alendronate 70 MG tablet Commonly known as:  FOSAMAX Take  1 tablet (70 mg total) by mouth every 7 (seven) days. Take with a full glass of water on an empty stomach.   ALPRAZolam 0.5 MG tablet Commonly known as:  XANAX Take 1 tablet (0.5 mg total) by mouth at bedtime as needed.   aspirin EC 81 MG tablet Take 81 mg by mouth daily.   CALCIUM CITRATE PO Take 1,200 mg by mouth 2 (two) times daily.   cetirizine 10 MG tablet Commonly known as:  ZYRTEC Take 0.5 tablets (5 mg total) by mouth at bedtime.   cyclobenzaprine 5 MG tablet Commonly known as:  FLEXERIL Take 1 tablet (5 mg total) by mouth 3 (three) times daily as needed for muscle spasms.   ferrous sulfate 325 (65 FE) MG tablet Take 325 mg by mouth 2 (two) times daily with a meal.   fluorouracil 5 % cream Commonly known as:  EFUDEX Apply 1 application topically daily as needed (cancer spots).   fluticasone 50 MCG/ACT nasal spray Commonly known as:  FLONASE Place 2 sprays into both nostrils daily.   HAIR SKIN AND NAILS FORMULA PO Take 1 tablet by mouth 2 (two) times daily.   hydrOXYzine 25 MG capsule Commonly known as:  VISTARIL Take 25 mg by mouth 2 (two) times daily.  hydrOXYzine 25 MG tablet Commonly known as:  ATARAX/VISTARIL TAKE 1 TABLET THREE TIMES DAILY AS NEEDED FOR ITCHING   levothyroxine 125 MCG tablet Commonly known as:  SYNTHROID, LEVOTHROID Take 1 tablet (125 mcg total) by mouth daily.   loperamide 2 MG capsule Commonly known as:  IMODIUM TAKE 2 CAPSULES 4 TIMES DAILY AS NEEDED   loratadine 10 MG tablet Commonly known as:  CLARITIN Take 1 tablet (10 mg total) by mouth daily.   losartan 25 MG tablet Commonly known as:  COZAAR TAKE 1 TABLET BY MOUTH ONCE DAILY   magnesium oxide 400 MG tablet Commonly known as:  MAG-OX Take 400 mg by mouth daily.   metoprolol succinate 25 MG 24 hr tablet Commonly known as:  TOPROL-XL TAKE 1 TABLET BY MOUTH ONCE DAILY   pantoprazole 40 MG tablet Commonly known as:  PROTONIX Take 1 tablet (40 mg total) by mouth  daily before breakfast.   propranolol 80 MG tablet Commonly known as:  INDERAL Take 80 mg by mouth daily.   ranitidine 150 MG tablet Commonly known as:  ZANTAC Take 1 tablet (150 mg total) by mouth 2 (two) times daily.   tacrolimus 1 MG capsule Commonly known as:  PROGRAF Take 2 mg by mouth 2 (two) times daily.   ursodiol 250 MG tablet Commonly known as:  ACTIGALL Take 250 mg by mouth 2 (two) times daily.   VITAMIN B 12 PO Take 1,000 mg by mouth daily.   Vitamin D (Ergocalciferol) 50000 units Caps capsule Commonly known as:  DRISDOL TAKE 1 CAPSULE BY MOUTH ONCE A WEEK   vitamin E 400 UNIT capsule Generic drug:  vitamin E Take 400 Units by mouth daily.          Objective:    BP 128/83   Pulse 99   Temp 97.7 F (36.5 C) (Oral)   Ht 5\' 4"  (1.626 m)   Wt 142 lb (64.4 kg)   BMI 24.37 kg/m   Allergies  Allergen Reactions  . Ciprofloxacin Itching  . Codeine Nausea Only  . Oxycodone Nausea Only    Wt Readings from Last 3 Encounters:  08/11/18 142 lb (64.4 kg)  08/05/18 142 lb (64.4 kg)  07/17/18 142 lb (64.4 kg)    Physical Exam  Constitutional: She is oriented to person, place, and time. She appears well-developed and well-nourished.  HENT:  Head: Normocephalic and atraumatic.  Eyes: Pupils are equal, round, and reactive to light. Conjunctivae and EOM are normal.  Cardiovascular: Normal rate, regular rhythm, normal heart sounds and intact distal pulses.  Pulmonary/Chest: Effort normal and breath sounds normal.  Abdominal: Soft. Bowel sounds are normal.  Neurological: She is alert and oriented to person, place, and time. She has normal reflexes.  Skin: Skin is warm and dry. Rash noted. Rash is urticarial. There is erythema.  Psychiatric: She has a normal mood and affect. Her behavior is normal. Judgment and thought content normal.        Assessment & Plan:   1. Urticaria - methylPREDNISolone acetate (DEPO-MEDROL) injection 80 mg - loratadine  (CLARITIN) 10 MG tablet; Take 1 tablet (10 mg total) by mouth daily.  Dispense: 90 tablet; Refill: 3 - ranitidine (ZANTAC) 150 MG tablet; Take 1 tablet (150 mg total) by mouth 2 (two) times daily.  Dispense: 60 tablet; Refill: 6 - diphenhydrAMINE (BENADRYL) injection 50 mg    Continue all other maintenance medications as listed above.  Follow up plan: No follow-ups on file.  Educational handout given for  hives  Terald Sleeper PA-C [facility] [address]  [place], [place] [address] [phone]   08/11/2018, 8:29 AM

## 2018-08-12 ENCOUNTER — Other Ambulatory Visit: Payer: Self-pay | Admitting: Physician Assistant

## 2018-08-12 DIAGNOSIS — C44722 Squamous cell carcinoma of skin of right lower limb, including hip: Secondary | ICD-10-CM | POA: Diagnosis not present

## 2018-08-12 DIAGNOSIS — C44729 Squamous cell carcinoma of skin of left lower limb, including hip: Secondary | ICD-10-CM | POA: Diagnosis not present

## 2018-08-12 DIAGNOSIS — D044 Carcinoma in situ of skin of scalp and neck: Secondary | ICD-10-CM | POA: Diagnosis not present

## 2018-08-12 DIAGNOSIS — C44622 Squamous cell carcinoma of skin of right upper limb, including shoulder: Secondary | ICD-10-CM | POA: Diagnosis not present

## 2018-08-13 ENCOUNTER — Encounter: Payer: Self-pay | Admitting: Physical Therapy

## 2018-08-13 ENCOUNTER — Ambulatory Visit: Payer: Medicare Other | Attending: Orthopedic Surgery | Admitting: Physical Therapy

## 2018-08-13 DIAGNOSIS — M6281 Muscle weakness (generalized): Secondary | ICD-10-CM

## 2018-08-13 DIAGNOSIS — M25512 Pain in left shoulder: Secondary | ICD-10-CM

## 2018-08-13 DIAGNOSIS — G8929 Other chronic pain: Secondary | ICD-10-CM

## 2018-08-13 DIAGNOSIS — M25612 Stiffness of left shoulder, not elsewhere classified: Secondary | ICD-10-CM

## 2018-08-13 NOTE — Therapy (Signed)
[facility] [place], [place], [address] Phone: [phone]   Fax:  [phone]  Physical Therapy Treatment  Patient Details  Name: Cassidy Barber MRN: [id number] Date of Birth: 1951-02-07 Referring Provider (PT): Jenetta Loges, [place]   Encounter Date: 08/13/2018  PT End of Session - 08/13/18 1157    Visit Number  4    Number of Visits  18    Date for PT Re-Evaluation  09/17/18    Authorization Type  FOTO every 5th visit, Progress note every 10th visit    PT Start Time  0903    PT Stop Time  0958    PT Time Calculation (min)  55 min    Activity Tolerance  Patient tolerated treatment well    Behavior During Therapy  [facility] for tasks assessed/performed       Past Medical History:  Diagnosis Date  . Anemia of chronic disease 10/26/2013  . Hernia of unspecified site of abdominal cavity without mention of obstruction or gangrene   . Hypertension   . Liver disease   . Lynch syndrome   . Lynch syndrome   . Primary biliary cirrhosis ([place]) 10/26/2013  . Thyroid disease     Past Surgical History:  Procedure Laterality Date  . ABDOMINAL HERNIA REPAIR     Patient's states that she has had 8- 9 hernia surgeries  . ABDOMINAL HYSTERECTOMY    . CHOLECYSTECTOMY  2007  . COLON RESECTION    . COLON SURGERY  2008   Done at [facility]  . COLONOSCOPY     Done at [facility]  . EYE SURGERY     lasix  . FLEXIBLE SIGMOIDOSCOPY N/A 10/20/2015   Procedure: FLEXIBLE SIGMOIDOSCOPY;  Surgeon: Rogene Houston, MD;  [facility];  Service: Endoscopy;  Laterality: N/A;  51 - Dr Laural Golden has meeting until 1:00  . FLEXIBLE SIGMOIDOSCOPY N/A 07/11/2016   Procedure: FLEXIBLE SIGMOIDOSCOPY;  Surgeon: Rogene Houston, MD;  [facility];  Service: Endoscopy;  Laterality: N/A;  1200  . FLEXIBLE SIGMOIDOSCOPY N/A 08/09/2017   Procedure: FLEXIBLE SIGMOIDOSCOPY;  Surgeon: Rogene Houston, MD;  [facility];  Service: Endoscopy;   Laterality: N/A;  1:00  . HERNIA REPAIR    . LIVER TRANSPLANT  [id number]  . POLYPECTOMY  08/09/2017   Procedure: POLYPECTOMY;  Surgeon: Rogene Houston, MD;  [facility];  Service: Endoscopy;;  colon small bowel  . REVERSE SHOULDER ARTHROPLASTY Left 07/17/2018  . REVERSE SHOULDER ARTHROPLASTY Left 07/17/2018   Procedure: LEFT REVERSE SHOULDER ARTHROPLASTY;  Surgeon: Justice Britain, MD;  Location: [place];  Service: Orthopedics;  Laterality: Left;  142min  . SPLENECTOMY  2006  . UPPER GASTROINTESTINAL ENDOSCOPY     Done at [facility]    There were no vitals filed for this visit.  Subjective Assessment - 08/13/18 1117    Subjective  No new complaints.    Currently in Pain?  Yes    Pain Score  7     Pain Location  Shoulder    Pain Orientation  Left;Anterior    Pain Descriptors / Indicators  Tightness    Pain Type  Surgical pain    Pain Onset  1 to 4 weeks ago                       [facility] Adult PT Treatment/Exercise - 08/13/18 0001      Modalities   Modalities  Electrical Stimulation  Acupuncturist Location  Left shoulder.    Electrical Stimulation Action  Low-level IFC    Electrical Stimulation Parameters  80-150 Hz x 20 minutes.    Electrical Stimulation Goals  Pain      Vasopneumatic   Number Minutes Vasopneumatic   20 minutes    Vasopnuematic Location   --   Left shoulder.  Pillow btw lt elbow and thorax.   Vasopneumatic Pressure  Low      Manual Therapy   Manual Therapy  Passive ROM    Passive ROM  Gentle PROM to patient's left shoulder within protocol guidelines into flexion and  ER x 23 minutes.                  PT Long Term Goals - 08/06/18 1740      PT LONG TERM GOAL #1   Title  Patient will be independent with HEP and its progression    Time  6    Period  Weeks    Status  New      PT LONG TERM GOAL #2   Title  Patient will demonstrate 130+ degrees of left shoulder flexion AROM to improve  ability to perform functional tasks    Time  6    Period  Weeks    Status  New      PT LONG TERM GOAL #3   Title  Patient will demonstrate 65+ degrees of left ER AROM to don/doff apparel.    Time  6    Period  Weeks    Status  New      PT LONG TERM GOAL #4   Title  Patient will demonstrate 4/5 or greater left shoulder MMT in all planes to improve stability during functional tasks.    Time  6    Period  Weeks    Status  New      PT LONG TERM GOAL #5   Title  Patient will report ability to perform all ADLs independently with less than 4/10 left shoulder pain.    Time  6    Period  Weeks    Status  New            Plan - 08/13/18 1154    Clinical Impression Statement  Patient did very well.  Her passive range of motion is very smooth.  She is pleased with her progress thus far.      PT Treatment/Interventions  ADLs/Self Care Home Management;Cryotherapy;Electrical Stimulation;Ultrasound;Moist Heat;Therapeutic activities;Therapeutic exercise;Patient/family education;Manual techniques;Passive range of motion;Dry needling;Vasopneumatic Device;Neuromuscular re-education    PT Next Visit Plan  See protocol; add gentle pulleys and isometrics to abd, ER. PROM, modalities PRN    PT Home Exercise Plan  see patient education section    Consulted and Agree with Plan of Care  Patient       Patient will benefit from skilled therapeutic intervention in order to improve the following deficits and impairments:  Decreased activity tolerance, Decreased range of motion, Decreased strength, Pain, Impaired UE functional use  Visit Diagnosis: Acute pain of left shoulder  Stiffness of left shoulder, not elsewhere classified  Muscle weakness (generalized)  Chronic left shoulder pain     Problem List Patient Active Problem List   Diagnosis Date Noted  . S/p reverse total shoulder arthroplasty 07/17/2018  . History of colon cancer 06/12/2018  . History of colonic polyps 06/12/2018  .  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  . Gastroesophageal reflux disease 06/12/2018  . Chronic  diarrhea 06/26/2017  . Iron deficiency anemia 06/26/2017  . GAD (generalized anxiety disorder) 08/28/2016  . Vitamin D deficiency 08/28/2016  . GERD (gastroesophageal reflux disease) 08/28/2016  . Insomnia 08/28/2016  . Convulsions ([place]) 11/30/2015  . Liver transplant recipient [facility]) 11/30/2015  . Seizures ([place]) 09/27/2015  . Essential hypertension, benign 03/10/2015  . Hx of liver transplant ([place]) 12/08/2014  . Diarrhea 12/08/2014  . Colon cancer ([place]) 12/08/2014  . Hepatic encephalopathy ([place]) 10/26/2013  . Anemia of chronic disease 10/26/2013  . Personal history of colonic polyps 12/27/2012  . PBC (primary biliary cirrhosis) 03/24/2012  . Hypothyroidism 03/24/2012  . Ventral hernia, recurrent 03/24/2012    Deedee Lybarger, [place] MPT 08/13/2018, 12:11 PM  [facility] [address] [place], [place], [address] Phone: [phone]   Fax:  [phone]  Name: Cassidy Barber MRN: [id number] Date of Birth: 06-May-1951

## 2018-08-14 LAB — CMP14+EGFR
ALT: 20 IU/L (ref 0–32)
AST: 31 IU/L (ref 0–40)
Albumin/Globulin Ratio: 1.1 — ABNORMAL LOW (ref 1.2–2.2)
Albumin: 3.7 g/dL (ref 3.6–4.8)
Alkaline Phosphatase: 546 IU/L — ABNORMAL HIGH (ref 39–117)
BUN/Creatinine Ratio: 16 (ref 12–28)
BUN: 14 mg/dL (ref 8–27)
Bilirubin Total: 0.2 mg/dL (ref 0.0–1.2)
CALCIUM: 9.4 mg/dL (ref 8.7–10.3)
CO2: 23 mmol/L (ref 20–29)
CREATININE: 0.88 mg/dL (ref 0.57–1.00)
Chloride: 99 mmol/L (ref 96–106)
GFR calc Af Amer: 79 mL/min/{1.73_m2} (ref >59)
GFR, EST NON AFRICAN AMERICAN: 68 mL/min/{1.73_m2} (ref >59)
GLOBULIN, TOTAL: 3.3 g/dL (ref 1.5–4.5)
Glucose: 119 mg/dL — ABNORMAL HIGH (ref 65–99)
POTASSIUM: 4.6 mmol/L (ref 3.5–5.2)
SODIUM: 138 mmol/L (ref 134–144)
Total Protein: 7 g/dL (ref 6.0–8.5)

## 2018-08-14 LAB — CBC WITH DIFFERENTIAL/PLATELET
BASOS ABS: 0.1 10*3/uL (ref 0.0–0.2)
BASOS: 1 %
EOS (ABSOLUTE): 0.3 10*3/uL (ref 0.0–0.4)
Eos: 4 %
HEMOGLOBIN: 10.7 g/dL — AB (ref 11.1–15.9)
Hematocrit: 35 % (ref 34.0–46.6)
IMMATURE GRANULOCYTES: 0 %
Immature Grans (Abs): 0 10*3/uL (ref 0.0–0.1)
LYMPHS: 22 %
Lymphocytes Absolute: 1.9 10*3/uL (ref 0.7–3.1)
MCH: 26.3 pg — ABNORMAL LOW (ref 26.6–33.0)
MCHC: 30.6 g/dL — ABNORMAL LOW (ref 31.5–35.7)
MCV: 86 fL (ref 79–97)
MONOCYTES: 7 %
Monocytes Absolute: 0.6 10*3/uL (ref 0.1–0.9)
NEUTROS ABS: 5.7 10*3/uL (ref 1.4–7.0)
NEUTROS PCT: 66 %
PLATELETS: 716 10*3/uL — AB (ref 150–450)
RBC: 4.07 x10E6/uL (ref 3.77–5.28)
RDW: 16.9 % — ABNORMAL HIGH (ref 12.3–15.4)
WBC: 8.6 10*3/uL (ref 3.4–10.8)

## 2018-08-14 LAB — EVEROLIMUS: EVEROLIMUS: NOT DETECTED ng/mL (ref 3.0–8.0)

## 2018-08-14 LAB — TACROLIMUS LEVEL: TACROLIMUS LVL: 5.5 ng/mL (ref 2.0–20.0)

## 2018-08-14 LAB — MAGNESIUM: Magnesium: 1.7 mg/dL (ref 1.6–2.3)

## 2018-08-15 ENCOUNTER — Encounter: Payer: Medicare Other | Admitting: Physical Therapy

## 2018-08-18 ENCOUNTER — Ambulatory Visit (INDEPENDENT_AMBULATORY_CARE_PROVIDER_SITE_OTHER): Payer: Medicare Other | Admitting: Family Medicine

## 2018-08-18 ENCOUNTER — Ambulatory Visit: Payer: Medicare Other | Admitting: Physical Therapy

## 2018-08-18 ENCOUNTER — Encounter: Payer: Self-pay | Admitting: Physical Therapy

## 2018-08-18 ENCOUNTER — Ambulatory Visit: Payer: Medicare Other | Admitting: Family

## 2018-08-18 VITALS — BP 156/99 | HR 89 | Temp 97.0°F | Ht 64.0 in | Wt 143.0 lb

## 2018-08-18 DIAGNOSIS — M25612 Stiffness of left shoulder, not elsewhere classified: Secondary | ICD-10-CM | POA: Diagnosis not present

## 2018-08-18 DIAGNOSIS — M6281 Muscle weakness (generalized): Secondary | ICD-10-CM | POA: Diagnosis not present

## 2018-08-18 DIAGNOSIS — R7989 Other specified abnormal findings of blood chemistry: Secondary | ICD-10-CM

## 2018-08-18 DIAGNOSIS — M25512 Pain in left shoulder: Secondary | ICD-10-CM

## 2018-08-18 DIAGNOSIS — R21 Rash and other nonspecific skin eruption: Secondary | ICD-10-CM

## 2018-08-18 DIAGNOSIS — G8929 Other chronic pain: Secondary | ICD-10-CM | POA: Diagnosis not present

## 2018-08-18 MED ORDER — METHYLPREDNISOLONE ACETATE 80 MG/ML IJ SUSP
80.0000 mg | Freq: Once | INTRAMUSCULAR | Status: AC
  Administered 2018-08-18: 80 mg via INTRAMUSCULAR

## 2018-08-18 NOTE — Patient Instructions (Signed)
When you come in for your other labs later this week, let the lab know you have a red meat lab to get as well for me.  I will contact you with the results once they are available.  I have placed the referral for the local hematologist.  Platelet Count Test Why am I having this test? The platelet count test is performed to obtain a measure of how many platelets you have within a specific amount (volume) of blood. Platelets are specialized cells made in your bone marrow that gather at the site of tissue injury to stop bleeding. Most of the platelets in your body can be found in your bloodstream. Fewer platelets are stored in your liver and spleen. Your health care provider may want you to have this test if you have a rash or other medical condition that suggests that you have a low platelet count. This may include one of these conditions that cause excess bleeding or delayed blood clotting, such as:  Petechiae. These are small hemorrhages in the skin that cause a rash. The rash appears as a collection of red-purple pinprick-sized dots.  Heavy menstrual bleeding, if you are female.  Thrombocytopenia. This is a condition in which you have a low platelet count.  Bone marrow failure.  This test may also be used to monitor treatment for those conditions. What kind of sample is taken? A blood sample is required for this test. It is usually collected by inserting a needle into a vein or by sticking a finger with a small needle. How do I prepare for this test? There is no preparation or fasting required for this test. What are the reference ranges? Reference ranges are considered healthy ranges established after testing a large group of healthy people. Reference ranges may vary among different people, labs, and hospitals. It is your responsibility to obtain your test results. Ask the lab or department performing the test when and how you will get your results.  Adult or elderly: 150,000-400,000 per  microliter.  Child: 150,000-400,000 per microliter.  Infant: 200,000-475,000 per microliter.  Premature infant: 100,000-300,000 per microliter.  Newborn: 150,000-300,000 per microliter.  What do the results mean? Results that are higher than the reference range can indicate a number of health conditions. These may include:  Certain types of cancer, such as leukemia or lymphoma.  Polycythemia vera. This is a condition in which the bone marrow is producing excess amounts of all cell types, including platelets.  Postsplenectomy syndrome. This is a condition that can occur after surgery that is done to remove the spleen (splenectomy).  Rheumatoid arthritis.  Iron-deficiency anemia.  Results that are lower than the reference range can also indicate a number of health conditions. These may include:  Hypersplenism. This is a condition in which the spleen is breaking down platelets faster than normal.  Bleeding (hemorrhage).  Immune thrombocytopenia.  Cancer or chemotherapy treatments for cancers such as leukemia.  Thrombotic thrombocytopenia.  HELLP syndrome.  Abnormal thyroid gland function, such as in Graves disease.  Certain inherited disorders that cause a decreased platelet count.  Disseminated intravascular coagulation (DIC). This is a condition in which abnormal blood-clotting processes occur.  Systemic lupus erythematosus (SLE).  Certain types of anemia, such as pernicious and hemolytic anemias.  Infection.  Talk with your health care provider to discuss your results, treatment options, and if necessary, the need for more tests. Talk with your health care provider if you have any questions about your results. This information is not  intended to replace advice given to you by your health care provider. Make sure you discuss any questions you have with your health care provider. Document Released: 11/23/2004 Document Revised: 07/04/2016 Document Reviewed:  03/24/2014 Elsevier Interactive Patient Education  [employer].

## 2018-08-18 NOTE — Progress Notes (Signed)
Subjective: CC: Rash PCP: [name], FNP ZOX:WRUE Cassidy Barber is a 67 y.o. female presenting to clinic today for:  1. Rash/ thrombocytosis Patient reports that she continues to get an itchy rash along her back and bilateral lower extremities that seems to be intermittent.  She has been taking the prescribed medications including ranitidine, Benadryl and Claritin.  This does seem to help some but she notes that today, she started feeling quite a bit itchier and feels like the Depo-Medrol that she was given at last visit 1 week ago has officially worn off.  She felt that that injection helped tremendously with symptoms and she would like to see if perhaps we could do another round of steroid.  She was seen by her dermatologist who agreed that they thought it was an allergy mediated reaction.  However, there is some concern that it may be related to her antirejection medications.  She has labs scheduled for Wednesday by her specialist.  She also reports that she had an elevation in her platelets that was found recently by her specialist.  She has been referred to hematology at [facility] but is unable to secure an appointment until January.  They were recommending that she see somebody locally in the interim.  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   ROS: Per HPI  Allergies  Allergen Reactions  . Ciprofloxacin Itching  . Codeine Nausea Only   Past Medical History:  Diagnosis Date  . Anemia of chronic disease 10/26/2013  . Hernia of unspecified site of abdominal cavity without mention of obstruction or gangrene   . Hypertension   . Liver disease   . Lynch syndrome   . Lynch syndrome   . Primary biliary cirrhosis ([place]) 10/26/2013  . Thyroid disease     Current Outpatient Medications:  .  acetaminophen (TYLENOL) 500 MG tablet, Take 1,000 mg by mouth 2 (two) times daily as needed for moderate  pain or headache., Disp: , Rfl:  .  alendronate (FOSAMAX) 70 MG tablet, Take 1 tablet (70 mg total) by mouth every 7 (seven) days. Take with a full glass of water on an empty stomach., Disp: 12 tablet, Rfl: 2 .  ALPRAZolam (XANAX) 0.5 MG tablet, Take 1 tablet (0.5 mg total) by mouth at bedtime as needed. (Patient taking differently: Take 0.5 mg by mouth at bedtime. ), Disp: 30 tablet, Rfl: 5 .  aspirin EC 81 MG tablet, Take 81 mg by mouth daily., Disp: , Rfl:  .  Biotin 10000 MCG TABS, Take 10,000 mcg by mouth daily., Disp: , Rfl:  .  CALCIUM CITRATE PO, Take 1,200 mg by mouth 2 (two) times daily. , Disp: , Rfl:  .  cetirizine (ZYRTEC) 10 MG tablet, Take 0.5 tablets (5 mg total) by mouth at bedtime., Disp: 30 tablet, Rfl: 11 .  cyclobenzaprine (FLEXERIL) 5 MG tablet, Take 1 tablet (5 mg total) by mouth 3 (three) times daily as needed for muscle spasms. (Patient taking differently: Take 5 mg by mouth 2 (two) times daily as needed for muscle spasms. ), Disp: 60 tablet, Rfl: 1 .  ferrous sulfate 325 (65 FE) MG tablet, Take 325 mg by mouth 2 (two) times daily with a meal. , Disp: , Rfl:  .  fluorouracil (EFUDEX) 5 % cream, Apply 1 application topically daily as needed (cancer spots). , Disp: , Rfl: 0 .  fluticasone (FLONASE) 50 MCG/ACT nasal  spray, Place 2 sprays into both nostrils daily. (Patient taking differently: Place 2 sprays into both nostrils daily as needed for allergies. ), Disp: 16 g, Rfl: 6 .  HYDROcodone-acetaminophen (NORCO/VICODIN) 5-325 MG tablet, Take 1 tablet by mouth 3 (three) times daily as needed for moderate pain., Disp: , Rfl:  .  hydrocortisone cream 1 %, Apply 1 application topically daily as needed for itching., Disp: , Rfl:  .  hydrOXYzine (ATARAX/VISTARIL) 25 MG tablet, TAKE 1 TABLET THREE TIMES DAILY AS NEEDED FOR ITCHING, Disp: 60 tablet, Rfl: 1 .  levothyroxine (SYNTHROID) 125 MCG tablet, Take 1 tablet (125 mcg total) by mouth daily., Disp: 90 tablet, Rfl: 1 .  loperamide  (IMODIUM) 2 MG capsule, TAKE 2 CAPSULES 4 TIMES DAILY AS NEEDED (Patient taking differently: Take 4 mg by mouth 4 (four) times daily as needed for diarrhea or loose stools. ), Disp: 90 capsule, Rfl: 1 .  loratadine (CLARITIN) 10 MG tablet, Take 1 tablet (10 mg total) by mouth daily., Disp: 90 tablet, Rfl: 3 .  losartan (COZAAR) 25 MG tablet, Take 1 tablet (25 mg total) by mouth daily., Disp: 90 tablet, Rfl: 0 .  magnesium oxide (MAG-OX) 400 MG tablet, Take 400 mg by mouth daily., Disp: , Rfl:  .  metoprolol succinate (TOPROL-XL) 25 MG 24 hr tablet, TAKE 1 TABLET BY MOUTH ONCE DAILY, Disp: 90 tablet, Rfl: 1 .  ondansetron (ZOFRAN) 4 MG tablet, Take 4 mg by mouth every 8 (eight) hours as needed for nausea or vomiting., Disp: , Rfl:  .  oxyCODONE (OXY IR/ROXICODONE) 5 MG immediate release tablet, Take 5 mg by mouth 2 (two) times daily as needed for severe pain., Disp: , Rfl:  .  pantoprazole (PROTONIX) 40 MG tablet, Take 1 tablet (40 mg total) by mouth daily before breakfast., Disp: 90 tablet, Rfl: 2 .  propranolol (INDERAL) 80 MG tablet, Take 80 mg by mouth daily. , Disp: , Rfl:  .  ranitidine (ZANTAC) 150 MG tablet, Take 1 tablet (150 mg total) by mouth 2 (two) times daily., Disp: 60 tablet, Rfl: 6 .  tacrolimus (PROGRAF) 1 MG capsule, Take 2 mg by mouth 2 (two) times daily. , Disp: , Rfl:  .  ursodiol (ACTIGALL) 250 MG tablet, Take 250 mg by mouth 2 (two) times daily., Disp: , Rfl:  .  vitamin Cassidy-12 (CYANOCOBALAMIN) 1000 MCG tablet, Take 1,000 mcg by mouth daily., Disp: , Rfl:  .  Vitamin D, Ergocalciferol, (DRISDOL) 50000 units CAPS capsule, TAKE 1 CAPSULE BY MOUTH ONCE A WEEK, Disp: 12 capsule, Rfl: 0 .  vitamin E (VITAMIN E) 400 UNIT capsule, Take 400 Units by mouth daily., Disp: , Rfl:  Social History   Socioeconomic History  . Marital status: Married    Spouse name: Not on file  . Number of children: 1  . Years of education: Not on file  . Highest education level: 12th grade  Occupational  History  . Occupation: Disability    Employer: [employer]    Comment: Multimedia programmer  Social Needs  . Financial resource strain: Not hard at all  . Food insecurity:    Worry: Never true    Inability: Never true  . Transportation needs:    Medical: No    Non-medical: No  Tobacco Use  . Smoking status: Never Smoker  . Smokeless tobacco: Never Used  Substance and Sexual Activity  . Alcohol use: No    Alcohol/week: 0.0 standard drinks  . Drug use: No  . Sexual  activity: Yes    Birth control/protection: None  Lifestyle  . Physical activity:    Days per week: 3 days    Minutes per session: 60 min  . Stress: Not at all  Relationships  . Social connections:    Talks on phone: More than three times a week    Gets together: More than three times a week    Attends religious service: More than 4 times per year    Active member of club or organization: Yes    Attends meetings of clubs or organizations: More than 4 times per year    Relationship status: Married  . Intimate partner violence:    Fear of current or ex partner: No    Emotionally abused: No    Physically abused: No    Forced sexual activity: No  Other Topics Concern  . Not on file  Social History Narrative   Patient lives in a two story home with her husband. She has an adult son. She is retired from being an Web designer for 30 years.    Family History  Problem Relation Age of Onset  . Prostate cancer Father   . Colon cancer Father   . Colon cancer Sister   . Lung cancer Sister   . Healthy Son   . Alcohol abuse Brother     Objective: Office vital signs reviewed. BP (!) 156/99   Pulse 89   Temp (!) 97 F (36.1 C) (Oral)   Ht 5\' 4"  (1.626 m)   Wt 143 lb (64.9 kg)   BMI 24.55 kg/m   Physical Examination:  General: Awake, alert, well appearing, No acute distress HEENT: sclera white, MMM, no oral mucosal edema.  Airway is patent. Cardio: regular rate and rhythm, S1S2 heard, no  murmurs appreciated Pulm: clear to auscultation bilaterally, no wheezes, rhonchi or rales; normal work of breathing on room air Skin: Multiple, blanching erythematous wheals appreciated along her lower back.  See below picture     Assessment/ Plan: 67 y.o. female   1. Elevated platelet count Referral placed to hematology.  Hopefully they can get her in sooner to take a look at her elevated platelet count.  Difficult to tell if this is related to underlying liver disease versus medication induced versus reactive thrombocytosis - Ambulatory referral to Hematology  2. Rash and nonspecific skin eruption May be medication induced.  She demonstrates no symptoms or signs of anaphylaxis.  She was given an additional dose of Depo-Medrol 80 here in office since this helped quite a bit at last visit.  I have encouraged her to continue oral antihistamines.  Will check alpha gal panel for completion.  May need to consider referral to allergy at some point if etiology of rash cannot be deciphered. - Alpha-Gal Panel; Future - methylPREDNISolone acetate (DEPO-MEDROL) injection 80 mg   No orders of the defined types were placed in this encounter.  No orders of the defined types were placed in this encounter.   Janora Norlander, DO [place] [phone]

## 2018-08-18 NOTE — Therapy (Signed)
[facility] [place], [place], [address] Phone: [phone]   Fax:  [phone]  Physical Therapy Treatment  Patient Details  Name: Cassidy Barber MRN: [id number] Date of Birth: 12-17-1950 Referring Provider (PT): Jenetta Loges, [place]   Encounter Date: 08/18/2018  PT End of Session - 08/18/18 0902    Visit Number  5    Number of Visits  18    Date for PT Re-Evaluation  09/17/18    Authorization Type  FOTO every 5th visit, Progress note every 10th visit    PT Start Time  0900    PT Stop Time  0950    PT Time Calculation (min)  50 min    Activity Tolerance  Patient tolerated treatment well    Behavior During Therapy  [facility] for tasks assessed/performed       Past Medical History:  Diagnosis Date  . Anemia of chronic disease 10/26/2013  . Hernia of unspecified site of abdominal cavity without mention of obstruction or gangrene   . Hypertension   . Liver disease   . Lynch syndrome   . Lynch syndrome   . Primary biliary cirrhosis ([place]) 10/26/2013  . Thyroid disease     Past Surgical History:  Procedure Laterality Date  . ABDOMINAL HERNIA REPAIR     Patient's states that she has had 8- 9 hernia surgeries  . ABDOMINAL HYSTERECTOMY    . CHOLECYSTECTOMY  2007  . COLON RESECTION    . COLON SURGERY  2008   Done at [facility]  . COLONOSCOPY     Done at [facility]  . EYE SURGERY     lasix  . FLEXIBLE SIGMOIDOSCOPY N/A 10/20/2015   Procedure: FLEXIBLE SIGMOIDOSCOPY;  Surgeon: Rogene Houston, MD;  [facility];  Service: Endoscopy;  Laterality: N/A;  60 - Dr Laural Golden has meeting until 1:00  . FLEXIBLE SIGMOIDOSCOPY N/A 07/11/2016   Procedure: FLEXIBLE SIGMOIDOSCOPY;  Surgeon: Rogene Houston, MD;  [facility];  Service: Endoscopy;  Laterality: N/A;  1200  . FLEXIBLE SIGMOIDOSCOPY N/A 08/09/2017   Procedure: FLEXIBLE SIGMOIDOSCOPY;  Surgeon: Rogene Houston, MD;  [facility];  Service: Endoscopy;   Laterality: N/A;  1:00  . HERNIA REPAIR    . LIVER TRANSPLANT  [id number]  . POLYPECTOMY  08/09/2017   Procedure: POLYPECTOMY;  Surgeon: Rogene Houston, MD;  [facility];  Service: Endoscopy;;  colon small bowel  . REVERSE SHOULDER ARTHROPLASTY Left 07/17/2018  . REVERSE SHOULDER ARTHROPLASTY Left 07/17/2018   Procedure: LEFT REVERSE SHOULDER ARTHROPLASTY;  Surgeon: Justice Britain, MD;  Location: [place];  Service: Orthopedics;  Laterality: Left;  139min  . SPLENECTOMY  2006  . UPPER GASTROINTESTINAL ENDOSCOPY     Done at [facility]    There were no vitals filed for this visit.  Subjective Assessment - 08/18/18 0903    Subjective  Patient reported feeling a little more sore today 8/10 and she took 2 Tylenols before coming to therapy.    Pertinent History  L reverse total shoulder replacement 07/17/18, OA    Limitations  Lifting;House hold activities    Patient Stated Goals  use arm again and return to gardening    Currently in Pain?  Yes    Pain Score  8          [facility] PT Assessment - 08/18/18 0001      Assessment   Medical Diagnosis  left reverse total shoulder replacement  Onset Date/Surgical Date  07/17/18    Hand Dominance  Right    Next MD Visit  September 05, 2018    Prior Therapy  yes      Precautions   Precautions  Shoulder    Type of Shoulder Precautions  see media for protocol      Observation/Other Assessments   Focus on Therapeutic Outcomes (FOTO)   70% limited; 79% limited at eval                   [facility] Adult PT Treatment/Exercise - 08/18/18 0001      Exercises   Exercises  Shoulder      Shoulder Exercises: Pulleys   Flexion  3 minutes      Shoulder Exercises: Isometric Strengthening   External Rotation  5X5"    ABduction  5X5"      Modalities   Modalities  Electrical Stimulation      Electrical Stimulation   Electrical Stimulation Location  Left shoulder.    Electrical Stimulation Action  IFC    Electrical Stimulation Parameters   80-150 hz x15 min    Electrical Stimulation Goals  Pain      Vasopneumatic   Number Minutes Vasopneumatic   15 minutes    Vasopnuematic Location   Shoulder    Vasopneumatic Pressure  Low    Vasopneumatic Temperature   52      Manual Therapy   Manual Therapy  Passive ROM    Passive ROM  Gentle PROM to patient's left shoulder within protocol guidelines into flexion, IR, ER and ABD                  PT Long Term Goals - 08/06/18 1740      PT LONG TERM GOAL #1   Title  Patient will be independent with HEP and its progression    Time  6    Period  Weeks    Status  New      PT LONG TERM GOAL #2   Title  Patient will demonstrate 130+ degrees of left shoulder flexion AROM to improve ability to perform functional tasks    Time  6    Period  Weeks    Status  New      PT LONG TERM GOAL #3   Title  Patient will demonstrate 65+ degrees of left ER AROM to don/doff apparel.    Time  6    Period  Weeks    Status  New      PT LONG TERM GOAL #4   Title  Patient will demonstrate 4/5 or greater left shoulder MMT in all planes to improve stability during functional tasks.    Time  6    Period  Weeks    Status  New      PT LONG TERM GOAL #5   Title  Patient will report ability to perform all ADLs independently with less than 4/10 left shoulder pain.    Time  6    Period  Weeks    Status  New            Plan - 08/18/18 0092    Clinical Impression Statement  Patient was able to tolerate progression of treatment well despite reports of 8/10 pain at start of session. Patient denied any increase of pain throughout session. Patient noted with smooth PROM to ranges per protocol. Normal response to modalities upon removal.  Patient's FOTO 70% limited; able to perform  some ADLs but flexes trunk to compensate for arm movement.    Clinical Presentation  Stable    Clinical Decision Making  Low    Rehab Potential  Good    PT Frequency  3x / week    PT Duration  6 weeks    PT  Treatment/Interventions  ADLs/Self Care Home Management;Cryotherapy;Electrical Stimulation;Ultrasound;Moist Heat;Therapeutic activities;Therapeutic exercise;Patient/family education;Manual techniques;Passive range of motion;Dry needling;Vasopneumatic Device;Neuromuscular re-education    PT Next Visit Plan  See protocol; add gentle pulleys and isometrics to abd, ER. PROM, modalities PRN    Consulted and Agree with Plan of Care  Patient       Patient will benefit from skilled therapeutic intervention in order to improve the following deficits and impairments:  Decreased activity tolerance, Decreased range of motion, Decreased strength, Pain, Impaired UE functional use  Visit Diagnosis: Acute pain of left shoulder  Stiffness of left shoulder, not elsewhere classified  Muscle weakness (generalized)     Problem List Patient Active Problem List   Diagnosis Date Noted  . S/p reverse total shoulder arthroplasty 07/17/2018  . History of colon cancer 06/12/2018  . History of colonic polyps 06/12/2018  . 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  . Gastroesophageal reflux disease 06/12/2018  . Chronic diarrhea 06/26/2017  . Iron deficiency anemia 06/26/2017  . GAD (generalized anxiety disorder) 08/28/2016  . Vitamin D deficiency 08/28/2016  . GERD (gastroesophageal reflux disease) 08/28/2016  . Insomnia 08/28/2016  . Convulsions ([place]) 11/30/2015  . Liver transplant recipient [facility]) 11/30/2015  . Seizures ([place]) 09/27/2015  . Essential hypertension, benign 03/10/2015  . Hx of liver transplant ([place]) 12/08/2014  . Diarrhea 12/08/2014  . Colon cancer ([place]) 12/08/2014  . Hepatic encephalopathy ([place]) 10/26/2013  . Anemia of chronic disease 10/26/2013  . Personal history of colonic polyps 12/27/2012  . PBC (primary biliary cirrhosis) 03/24/2012  . Hypothyroidism 03/24/2012  . Ventral hernia, recurrent 03/24/2012   Gabriela Eves, PT, DPT 08/18/2018, 9:54 AM  [facility] [address] [place], [place], [address] Phone: [phone]   Fax:  [phone]  Name: Cassidy Barber MRN: [id number] Date of Birth: May 20, 1951

## 2018-08-19 ENCOUNTER — Encounter (HOSPITAL_COMMUNITY): Payer: Self-pay | Admitting: Internal Medicine

## 2018-08-19 ENCOUNTER — Other Ambulatory Visit: Payer: Self-pay

## 2018-08-19 ENCOUNTER — Inpatient Hospital Stay (HOSPITAL_COMMUNITY): Payer: Medicare Other | Attending: Internal Medicine | Admitting: Internal Medicine

## 2018-08-19 ENCOUNTER — Inpatient Hospital Stay (HOSPITAL_COMMUNITY): Payer: Medicare Other

## 2018-08-19 VITALS — BP 147/84 | HR 79 | Temp 98.0°F | Resp 18 | Wt 140.3 lb

## 2018-08-19 DIAGNOSIS — F411 Generalized anxiety disorder: Secondary | ICD-10-CM | POA: Insufficient documentation

## 2018-08-19 DIAGNOSIS — E039 Hypothyroidism, unspecified: Secondary | ICD-10-CM | POA: Diagnosis not present

## 2018-08-19 DIAGNOSIS — Z85038 Personal history of other malignant neoplasm of large intestine: Secondary | ICD-10-CM | POA: Insufficient documentation

## 2018-08-19 DIAGNOSIS — Z79899 Other long term (current) drug therapy: Secondary | ICD-10-CM | POA: Insufficient documentation

## 2018-08-19 DIAGNOSIS — M7918 Myalgia, other site: Secondary | ICD-10-CM | POA: Diagnosis not present

## 2018-08-19 DIAGNOSIS — D473 Essential (hemorrhagic) thrombocythemia: Secondary | ICD-10-CM | POA: Diagnosis not present

## 2018-08-19 DIAGNOSIS — D509 Iron deficiency anemia, unspecified: Secondary | ICD-10-CM | POA: Diagnosis not present

## 2018-08-19 DIAGNOSIS — I10 Essential (primary) hypertension: Secondary | ICD-10-CM | POA: Diagnosis not present

## 2018-08-19 DIAGNOSIS — R21 Rash and other nonspecific skin eruption: Secondary | ICD-10-CM | POA: Diagnosis not present

## 2018-08-19 DIAGNOSIS — D75839 Thrombocytosis, unspecified: Secondary | ICD-10-CM

## 2018-08-19 DIAGNOSIS — D638 Anemia in other chronic diseases classified elsewhere: Secondary | ICD-10-CM | POA: Insufficient documentation

## 2018-08-19 DIAGNOSIS — Z944 Liver transplant status: Secondary | ICD-10-CM

## 2018-08-19 DIAGNOSIS — E559 Vitamin D deficiency, unspecified: Secondary | ICD-10-CM | POA: Insufficient documentation

## 2018-08-19 DIAGNOSIS — Z1509 Genetic susceptibility to other malignant neoplasm: Secondary | ICD-10-CM | POA: Insufficient documentation

## 2018-08-19 DIAGNOSIS — R899 Unspecified abnormal finding in specimens from other organs, systems and tissues: Secondary | ICD-10-CM

## 2018-08-19 DIAGNOSIS — M25559 Pain in unspecified hip: Secondary | ICD-10-CM

## 2018-08-19 LAB — CBC WITH DIFFERENTIAL/PLATELET
BASOS PCT: 1 %
Basophils Absolute: 0.1 10*3/uL (ref 0.0–0.1)
Eosinophils Absolute: 0.4 10*3/uL (ref 0.0–0.5)
Eosinophils Relative: 5 %
HEMATOCRIT: 36.9 % (ref 36.0–46.0)
HEMOGLOBIN: 11.3 g/dL — AB (ref 12.0–15.0)
Lymphocytes Relative: 30 %
Lymphs Abs: 2.5 10*3/uL (ref 0.7–4.0)
MCH: 26.5 pg (ref 26.0–34.0)
MCHC: 30.6 g/dL (ref 30.0–36.0)
MCV: 86.4 fL (ref 80.0–100.0)
MONOS PCT: 11 %
Monocytes Absolute: 0.9 10*3/uL (ref 0.1–1.0)
NEUTROS ABS: 4.4 10*3/uL (ref 1.7–7.7)
NEUTROS PCT: 53 %
Platelets: 504 10*3/uL — ABNORMAL HIGH (ref 150–400)
RBC: 4.27 MIL/uL (ref 3.87–5.11)
RDW: 17 % — ABNORMAL HIGH (ref 11.5–15.5)
WBC: 8.4 10*3/uL (ref 4.0–10.5)
nRBC: 0 % (ref 0.0–0.2)

## 2018-08-19 LAB — COMPREHENSIVE METABOLIC PANEL
ALBUMIN: 3.7 g/dL (ref 3.5–5.0)
ALK PHOS: 319 U/L — AB (ref 38–126)
ALT: 18 U/L (ref 0–44)
AST: 24 U/L (ref 15–41)
Anion gap: 8 (ref 5–15)
BILIRUBIN TOTAL: 0.4 mg/dL (ref 0.3–1.2)
BUN: 20 mg/dL (ref 8–23)
CO2: 28 mmol/L (ref 22–32)
CREATININE: 0.83 mg/dL (ref 0.44–1.00)
Calcium: 9 mg/dL (ref 8.9–10.3)
Chloride: 102 mmol/L (ref 98–111)
GFR calc Af Amer: >60 mL/min (ref >60)
GLUCOSE: 118 mg/dL — AB (ref 70–99)
Potassium: 4.4 mmol/L (ref 3.5–5.1)
Sodium: 138 mmol/L (ref 135–145)
TOTAL PROTEIN: 8 g/dL (ref 6.5–8.1)

## 2018-08-19 LAB — LACTATE DEHYDROGENASE: LDH: 196 U/L — AB (ref 98–192)

## 2018-08-19 LAB — C-REACTIVE PROTEIN

## 2018-08-19 LAB — FERRITIN: Ferritin: 33 ng/mL (ref 11–307)

## 2018-08-19 LAB — SEDIMENTATION RATE: Sed Rate: 33 mm/hr — ABNORMAL HIGH (ref 0–22)

## 2018-08-19 NOTE — Progress Notes (Signed)
Diagnosis Elevated laboratory test result  Thrombocytosis (HCC) - Plan: CBC with Differential/Platelet, Comprehensive metabolic panel, Lactate dehydrogenase, Ferritin, Protein electrophoresis, serum, Sedimentation rate, C-reactive protein, Rheumatoid factor, JAK2 genotypr, BCR-ABL1, CML/ALL, PCR, QUANT  Pain in joint involving pelvic region and thigh, unspecified laterality  Musculoskeletal pain - Plan: CBC with Differential/Platelet, Comprehensive metabolic panel, Lactate dehydrogenase, Ferritin, Protein electrophoresis, serum, Sedimentation rate, C-reactive protein, Rheumatoid factor, JAK2 genotypr, BCR-ABL1, CML/ALL, PCR, QUANT  Staging Cancer Staging No matching staging information was found for the patient.  Assessment and Plan: 1.  Thrombocytosis.  Pt has history of a liver transplant.  She reports she was told in the past she had elevated platelet count.  She has also had elevations in her alkaline phosphatase.  She is followed by the [place] transplant service at [facility].  Labs done 08/11/2018 showed a white count of 8.6 hemoglobin 10.7 platelets 716,000.  She has a normal differential.  Chemistries within normal limits with a potassium of 4.6 and creatinine of 0.88.  It was noted her alkaline phosphatase was elevated at 546.  Liver function tests were normal bilirubin was 0.2.  She reports she had recent shoulder surgery.  Denies any headaches or blurred vision.  Patient is seen today for consultation due to thrombocytosis.  Labs performed today 08/19/2018 showed a white count 8.4 hemoglobin 11.3 platelets 504,000.  She has a normal differential.  Ferritin was 33, LDH 196, potassium was 4.4, creatinine was 0.83,  She has normal liver function tests, alkaline phosphatase was 319.  Sed rate was elevated at 33.  I am awaiting results of Jak 2 and BCR/Abl testing.  I discussed with the patient this is likely a reactive process as it was noted today her platelet count is improved at 504,000 and sed  rate is elevated at 33.[name]  She will return to clinic in 2 weeks to go over the remainder of her lab studies.  All questions answered and she expressed understanding of the information presented.  2.  Liver transplant.  She is followed by the liver transplant team.  AST and ALT are normal.  She has a bilirubin of 0.4.  Alkaline phosphatase is 319 on labs that were performed today.  She should follow-up with transplant team at [facility] as recommended.  3.  Lynch syndrome.  The patient has undergone colectomy.  She reports she does mammogram screening on a consistent basis.  4.  Hypertension.  Blood pressure is 147/84.  Follow-up with PCP.  5.  Joint pain.  She reports recent shoulder surgery.  SPEP, CRP, Sed rate and RF ordered today.  Pt will follow-up to go over results.    Greater than 40 minutes spent with more than 50% spent in counseling and coordination of care.  HPI: 67 year old female who has a history of a liver transplant.  She reports she was told in the past she had elevated platelet count.  She has also had elevations in her alkaline phosphatase.  She is followed by the [place] transplant service at [facility].  Labs there were done 08/11/2018 showed a white count of 8.6 hemoglobin 10.7 platelets 716,000.  She has a normal differential.  Chemistries within normal limits with a potassium of 4.6 and creatinine of 0.88.  It was noted her alkaline phosphatase was elevated at 546.  Liver function tests were normal bilirubin was 0.2.  She reports she had recent shoulder surgery.  Denies any headaches or blurred vision.  Patient is seen today for consultation due to thrombocytosis.  Problem  List Patient Active Problem List   Diagnosis Date Noted  . S/p reverse total shoulder arthroplasty [Z96.619] 07/17/2018  . History of colon cancer [Z85.038] 06/12/2018  . History of colonic polyps [Z86.010] 06/12/2018  . Lynch syndrome [Z15.09] 06/12/2018  . Gastroesophageal reflux disease [K21.9] 06/12/2018  .  Chronic diarrhea [K52.9] 06/26/2017  . Iron deficiency anemia [D50.9] 06/26/2017  . GAD (generalized anxiety disorder) [F41.1] 08/28/2016  . Vitamin D deficiency [E55.9] 08/28/2016  . GERD (gastroesophageal reflux disease) [K21.9] 08/28/2016  . Insomnia [G47.00] 08/28/2016  . Convulsions ([place]) [R56.9] 11/30/2015  . Liver transplant recipient [facility]) [Z94.4] 11/30/2015  . Seizures ([place]) [R56.9] 09/27/2015  . Essential hypertension, benign [I10] 03/10/2015  . Hx of liver transplant ([place]) [Z94.4] 12/08/2014  . Diarrhea [R19.7] 12/08/2014  . Colon cancer ([place]) [C18.9] 12/08/2014  . Hepatic encephalopathy ([place]) [K72.90] 10/26/2013  . Anemia of chronic disease [D63.8] 10/26/2013  . Personal history of colonic polyps [Z86.010] 12/27/2012  . PBC (primary biliary cirrhosis) [K74.3] 03/24/2012  . Hypothyroidism [E03.9] 03/24/2012  . Ventral hernia, recurrent [K43.2] 03/24/2012    Past Medical History Past Medical History:  Diagnosis Date  . Anemia of chronic disease 10/26/2013  . Hernia of unspecified site of abdominal cavity without mention of obstruction or gangrene   . Hypertension   . Liver disease   . Lynch syndrome   . Lynch syndrome   . Primary biliary cirrhosis ([place]) 10/26/2013  . Thyroid disease     Past Surgical History Past Surgical History:  Procedure Laterality Date  . ABDOMINAL HERNIA REPAIR     Patient's states that she has had 8- 9 hernia surgeries  . ABDOMINAL HYSTERECTOMY    . CHOLECYSTECTOMY  2007  . COLON RESECTION    . COLON SURGERY  2008   Done at [facility]  . COLONOSCOPY     Done at [facility]  . EYE SURGERY     lasix  . FLEXIBLE SIGMOIDOSCOPY N/A 10/20/2015   Procedure: FLEXIBLE SIGMOIDOSCOPY;  Surgeon: Rogene Houston, MD;  [facility];  Service: Endoscopy;  Laterality: N/A;  52 - Dr Laural Golden has meeting until 1:00  . FLEXIBLE SIGMOIDOSCOPY N/A 07/11/2016   Procedure: FLEXIBLE SIGMOIDOSCOPY;  Surgeon: Rogene Houston, MD;  [facility];  Service:  Endoscopy;  Laterality: N/A;  1200  . FLEXIBLE SIGMOIDOSCOPY N/A 08/09/2017   Procedure: FLEXIBLE SIGMOIDOSCOPY;  Surgeon: Rogene Houston, MD;  [facility];  Service: Endoscopy;  Laterality: N/A;  1:00  . HERNIA REPAIR    . LIVER TRANSPLANT  [id number]  . POLYPECTOMY  08/09/2017   Procedure: POLYPECTOMY;  Surgeon: Rogene Houston, MD;  [facility];  Service: Endoscopy;;  colon small bowel  . REVERSE SHOULDER ARTHROPLASTY Left 07/17/2018  . REVERSE SHOULDER ARTHROPLASTY Left 07/17/2018   Procedure: LEFT REVERSE SHOULDER ARTHROPLASTY;  Surgeon: Justice Britain, MD;  Location: [place];  Service: Orthopedics;  Laterality: Left;  171mn  . SPLENECTOMY  2006  . UPPER GASTROINTESTINAL ENDOSCOPY     [facility]   Family History Family History  Problem Relation Age of Onset  . Prostate cancer Father   . Colon cancer Father   . Colon cancer Sister   . Lung cancer Sister   . Healthy Son   . Alcohol abuse Brother      Social History  reports that she has never smoked. She has never used smokeless tobacco. She reports that she does not drink alcohol or use drugs.  Medications  Current Outpatient Medications:  .  acetaminophen (TYLENOL) 500 MG tablet, Take 1,000 mg by mouth 2 (two) times daily as needed for moderate pain or headache., Disp: , Rfl:  .  alendronate (FOSAMAX) 70 MG tablet, Take 1 tablet (70 mg total) by mouth every 7 (seven) days. Take with a full glass of water on an empty stomach., Disp: 12 tablet, Rfl: 2 .  ALPRAZolam (XANAX) 0.5 MG tablet, Take 1 tablet (0.5 mg total) by mouth at bedtime as needed. (Patient taking differently: Take 0.5 mg by mouth at bedtime. ), Disp: 30 tablet, Rfl: 5 .  aspirin EC 81 MG tablet, Take 81 mg by mouth daily., Disp: , Rfl:  .  Biotin 10000 MCG TABS, Take 10,000 mcg by mouth daily., Disp: , Rfl:  .  CALCIUM CITRATE PO, Take 1,200 mg by mouth 2 (two) times daily. , Disp: , Rfl:  .  cetirizine (ZYRTEC) 10 MG tablet, Take 0.5  tablets (5 mg total) by mouth at bedtime., Disp: 30 tablet, Rfl: 11 .  cyclobenzaprine (FLEXERIL) 5 MG tablet, Take 1 tablet (5 mg total) by mouth 3 (three) times daily as needed for muscle spasms. (Patient taking differently: Take 5 mg by mouth 2 (two) times daily as needed for muscle spasms. ), Disp: 60 tablet, Rfl: 1 .  everolimus (AFINITOR) 5 MG tablet, Take 12.5 mg by mouth 2 (two) times daily., Disp: , Rfl:  .  ferrous sulfate 325 (65 FE) MG tablet, Take 325 mg by mouth 2 (two) times daily with a meal. , Disp: , Rfl:  .  fluorouracil (EFUDEX) 5 % cream, Apply 1 application topically daily as needed (cancer spots). , Disp: , Rfl: 0 .  fluticasone (FLONASE) 50 MCG/ACT nasal spray, Place 2 sprays into both nostrils daily. (Patient taking differently: Place 2 sprays into both nostrils daily as needed for allergies. ), Disp: 16 g, Rfl: 6 .  HYDROcodone-acetaminophen (NORCO/VICODIN) 5-325 MG tablet, Take 1 tablet by mouth 3 (three) times daily as needed for moderate pain., Disp: , Rfl:  .  hydrocortisone cream 1 %, Apply 1 application topically daily as needed for itching., Disp: , Rfl:  .  hydrOXYzine (ATARAX/VISTARIL) 25 MG tablet, TAKE 1 TABLET THREE TIMES DAILY AS NEEDED FOR ITCHING, Disp: 60 tablet, Rfl: 1 .  levothyroxine (SYNTHROID) 125 MCG tablet, Take 1 tablet (125 mcg total) by mouth daily., Disp: 90 tablet, Rfl: 1 .  loperamide (IMODIUM) 2 MG capsule, TAKE 2 CAPSULES 4 TIMES DAILY AS NEEDED (Patient taking differently: Take 4 mg by mouth 4 (four) times daily as needed for diarrhea or loose stools. ), Disp: 90 capsule, Rfl: 1 .  loratadine (CLARITIN) 10 MG tablet, Take 1 tablet (10 mg total) by mouth daily., Disp: 90 tablet, Rfl: 3 .  losartan (COZAAR) 25 MG tablet, Take 1 tablet (25 mg total) by mouth daily., Disp: 90 tablet, Rfl: 0 .  magnesium oxide (MAG-OX) 400 MG tablet, Take 400 mg by mouth daily., Disp: , Rfl:  .  metoprolol succinate (TOPROL-XL) 25 MG 24 hr tablet, TAKE 1 TABLET BY  MOUTH ONCE DAILY, Disp: 90 tablet, Rfl: 1 .  ondansetron (ZOFRAN) 4 MG tablet, Take 4 mg by mouth every 8 (eight) hours as needed for nausea or vomiting., Disp: , Rfl:  .  oxyCODONE (OXY IR/ROXICODONE) 5 MG immediate release tablet, Take 5 mg by mouth 2 (two) times daily as needed for severe pain., Disp: , Rfl:  .  pantoprazole (PROTONIX) 40 MG tablet, Take 1 tablet (40  mg total) by mouth daily before breakfast., Disp: 90 tablet, Rfl: 2 .  propranolol (INDERAL) 80 MG tablet, Take 80 mg by mouth daily. , Disp: , Rfl:  .  ranitidine (ZANTAC) 150 MG tablet, Take 1 tablet (150 mg total) by mouth 2 (two) times daily., Disp: 60 tablet, Rfl: 6 .  tacrolimus (PROGRAF) 1 MG capsule, Take 2 mg by mouth 2 (two) times daily. , Disp: , Rfl:  .  ursodiol (ACTIGALL) 250 MG tablet, Take 250 mg by mouth 2 (two) times daily., Disp: , Rfl:  .  vitamin B-12 (CYANOCOBALAMIN) 1000 MCG tablet, Take 1,000 mcg by mouth daily., Disp: , Rfl:  .  Vitamin D, Ergocalciferol, (DRISDOL) 50000 units CAPS capsule, TAKE 1 CAPSULE BY MOUTH ONCE A WEEK, Disp: 12 capsule, Rfl: 0 .  vitamin E (VITAMIN E) 400 UNIT capsule, Take 400 Units by mouth daily., Disp: , Rfl:   Allergies Ciprofloxacin and Codeine  Review of Systems Review of Systems - Oncology ROS negative other than mild abdominal pain   Physical Exam  Vitals Wt Readings from Last 3 Encounters:  08/19/18 140 lb 4.8 oz (63.6 kg)  08/18/18 143 lb (64.9 kg)  08/11/18 142 lb (64.4 kg)   Temp Readings from Last 3 Encounters:  08/19/18 98 F (36.7 C) (Oral)  08/18/18 (!) 97 F (36.1 C) (Oral)  08/11/18 97.7 F (36.5 C) (Oral)   BP Readings from Last 3 Encounters:  08/19/18 (!) 147/84  08/18/18 (!) 156/99  08/11/18 128/83   Pulse Readings from Last 3 Encounters:  08/19/18 79  08/18/18 89  08/11/18 99    Constitutional: Well-developed, well-nourished, and in no distress.   HENT: Head: Normocephalic and atraumatic.  Mouth/Throat: No oropharyngeal exudate.  Mucosa moist. Eyes: Pupils are equal, round, and reactive to light. Conjunctivae are normal. No scleral icterus.  Neck: Normal range of motion. Neck supple. No JVD present.  Cardiovascular: Normal rate, regular rhythm and normal heart sounds.  Exam reveals no gallop and no friction rub.   No murmur heard. Pulmonary/Chest: Effort normal and breath sounds normal. No respiratory distress. No wheezes.No rales.  Abdominal: Soft. Bowel sounds are normal. No distension. Scar noted from prior surgeries.  Slightly tender on RUQ, no guarding.   Musculoskeletal: No edema or tenderness.  Lymphadenopathy: No cervical, axillary or supraclavicular adenopathy.  Neurological: Alert and oriented to person, place, and time. No cranial nerve deficit.  Skin: Skin is warm and dry. No rash noted. No erythema. No pallor.  Psychiatric: Affect and judgment normal.   Labs Appointment on 08/19/2018  Component Date Value Ref Range Status  . WBC 08/19/2018 8.4  4.0 - 10.5 K/uL Final  . RBC 08/19/2018 4.27  3.87 - 5.11 MIL/uL Final  . Hemoglobin 08/19/2018 11.3* 12.0 - 15.0 g/dL Final  . HCT 08/19/2018 36.9  36.0 - 46.0 % Final  . MCV 08/19/2018 86.4  80.0 - 100.0 fL Final  . MCH 08/19/2018 26.5  26.0 - 34.0 pg Final  . MCHC 08/19/2018 30.6  30.0 - 36.0 g/dL Final  . RDW 08/19/2018 17.0* 11.5 - 15.5 % Final  . Platelets 08/19/2018 504* 150 - 400 K/uL Final  . nRBC 08/19/2018 0.0  0.0 - 0.2 % Final  . Neutrophils Relative % 08/19/2018 53  % Final  . Neutro Abs 08/19/2018 4.4  1.7 - 7.7 K/uL Final  . Lymphocytes Relative 08/19/2018 30  % Final  . Lymphs Abs 08/19/2018 2.5  0.7 - 4.0 K/uL Final  . Monocytes Relative 08/19/2018  11  % Final  . Monocytes Absolute 08/19/2018 0.9  0.1 - 1.0 K/uL Final  . Eosinophils Relative 08/19/2018 5  % Final  . Eosinophils Absolute 08/19/2018 0.4  0.0 - 0.5 K/uL Final  . Basophils Relative 08/19/2018 1  % Final  . Basophils Absolute 08/19/2018 0.1  0.0 - 0.1 K/uL Final   Performed  at [facility], [address], [place], [place] [address]  . Sodium 08/19/2018 138  135 - 145 mmol/L Final  . Potassium 08/19/2018 4.4  3.5 - 5.1 mmol/L Final  . Chloride 08/19/2018 102  98 - 111 mmol/L Final  . CO2 08/19/2018 28  22 - 32 mmol/L Final  . Glucose, Bld 08/19/2018 118* 70 - 99 mg/dL Final  . BUN 08/19/2018 20  8 - 23 mg/dL Final  . Creatinine, Ser 08/19/2018 0.83  0.44 - 1.00 mg/dL Final  . Calcium 08/19/2018 9.0  8.9 - 10.3 mg/dL Final  . Total Protein 08/19/2018 8.0  6.5 - 8.1 g/dL Final  . Albumin 08/19/2018 3.7  3.5 - 5.0 g/dL Final  . AST 08/19/2018 24  15 - 41 U/L Final  . ALT 08/19/2018 18  0 - 44 U/L Final  . Alkaline Phosphatase 08/19/2018 319* 38 - 126 U/L Final  . Total Bilirubin 08/19/2018 0.4  0.3 - 1.2 mg/dL Final  . GFR calc non Af Amer 08/19/2018 >60  >60 mL/min Final  . GFR calc Af Amer 08/19/2018 >60  >60 mL/min Final   Comment: (NOTE) The eGFR has been calculated using the CKD EPI equation. This calculation has not been validated in all clinical situations. eGFR's persistently <60 mL/min signify possible Chronic Kidney Disease.   Georgiann Hahn gap 08/19/2018 8  5 - 15 Final   Performed at [facility], [address], [place], [place] [address]  . LDH 08/19/2018 196* 98 - 192 U/L Final   Performed at [facility], [address], [place], [place] [address]  . Ferritin 08/19/2018 33  11 - 307 ng/mL Final   Performed at [facility], [address], [place], [place] [address]  . Sed Rate 08/19/2018 33* 0 - 22 mm/hr Final   Performed at [facility], [address] [place], [place] [address]     Pathology Orders Placed This Encounter  Procedures  . CBC with Differential/Platelet    Standing Status:   Future    Number of Occurrences:   1    Standing Expiration Date:   08/20/2019  . Comprehensive metabolic panel    Standing Status:   Future    Number of Occurrences:   1    Standing Expiration Date:   08/20/2019  . Lactate dehydrogenase    Standing  Status:   Future    Number of Occurrences:   1    Standing Expiration Date:   08/20/2019  . Ferritin    Standing Status:   Future    Number of Occurrences:   1    Standing Expiration Date:   08/20/2019  . Protein electrophoresis, serum    Standing Status:   Future    Number of Occurrences:   1    Standing Expiration Date:   08/20/2019  . Sedimentation rate    Standing Status:   Future    Number of Occurrences:   1    Standing Expiration Date:   08/20/2019  . C-reactive protein    Standing Status:   Future    Number of Occurrences:   1  Standing Expiration Date:   08/20/2019  . Rheumatoid factor    Standing Status:   Future    Number of Occurrences:   1    Standing Expiration Date:   08/20/2019  . JAK2 genotypr    Standing Status:   Future    Number of Occurrences:   1    Standing Expiration Date:   08/20/2019  . BCR-ABL1, CML/ALL, PCR, QUANT    Standing Status:   Future    Number of Occurrences:   1    Standing Expiration Date:   08/20/2019       Zoila Shutter MD

## 2018-08-20 ENCOUNTER — Encounter: Payer: Self-pay | Admitting: Physical Therapy

## 2018-08-20 ENCOUNTER — Ambulatory Visit: Payer: Medicare Other | Admitting: Physical Therapy

## 2018-08-20 ENCOUNTER — Other Ambulatory Visit: Payer: Medicare Other

## 2018-08-20 DIAGNOSIS — R21 Rash and other nonspecific skin eruption: Secondary | ICD-10-CM

## 2018-08-20 DIAGNOSIS — Z298 Encounter for other specified prophylactic measures: Secondary | ICD-10-CM

## 2018-08-20 DIAGNOSIS — M25512 Pain in left shoulder: Secondary | ICD-10-CM | POA: Diagnosis not present

## 2018-08-20 DIAGNOSIS — M6281 Muscle weakness (generalized): Secondary | ICD-10-CM

## 2018-08-20 DIAGNOSIS — M25612 Stiffness of left shoulder, not elsewhere classified: Secondary | ICD-10-CM

## 2018-08-20 DIAGNOSIS — G8929 Other chronic pain: Secondary | ICD-10-CM | POA: Diagnosis not present

## 2018-08-20 LAB — PROTEIN ELECTROPHORESIS, SERUM
A/G RATIO SPE: 0.9 (ref 0.7–1.7)
ALBUMIN ELP: 3.5 g/dL (ref 2.9–4.4)
ALPHA-1-GLOBULIN: 0.3 g/dL (ref 0.0–0.4)
Alpha-2-Globulin: 0.9 g/dL (ref 0.4–1.0)
BETA GLOBULIN: 1.2 g/dL (ref 0.7–1.3)
GAMMA GLOBULIN: 1.7 g/dL (ref 0.4–1.8)
Globulin, Total: 4 g/dL — ABNORMAL HIGH (ref 2.2–3.9)
Total Protein ELP: 7.5 g/dL (ref 6.0–8.5)

## 2018-08-20 LAB — RHEUMATOID FACTOR: RHEUMATOID FACTOR: 10.3 [IU]/mL (ref 0.0–13.9)

## 2018-08-20 NOTE — Therapy (Signed)
[facility] [place], [place], [address] Phone: [phone]   Fax:  [phone]  Physical Therapy Treatment  Patient Details  Name: Cassidy Barber MRN: [id number] Date of Birth: 11-21-1950 Referring Provider (PT): Jenetta Loges, [place]   Encounter Date: 08/20/2018  PT End of Session - 08/20/18 0910    Visit Number  6    Number of Visits  18    Date for PT Re-Evaluation  09/17/18    Authorization Type  FOTO every 5th visit, Progress note every 10th visit    PT Start Time  0900    PT Stop Time  0956    PT Time Calculation (min)  56 min    Activity Tolerance  Patient tolerated treatment well    Behavior During Therapy  [facility] for tasks assessed/performed       Past Medical History:  Diagnosis Date  . Anemia of chronic disease 10/26/2013  . Hernia of unspecified site of abdominal cavity without mention of obstruction or gangrene   . Hypertension   . Liver disease   . Lynch syndrome   . Lynch syndrome   . Primary biliary cirrhosis ([place]) 10/26/2013  . Thyroid disease     Past Surgical History:  Procedure Laterality Date  . ABDOMINAL HERNIA REPAIR     Patient's states that she has had 8- 9 hernia surgeries  . ABDOMINAL HYSTERECTOMY    . CHOLECYSTECTOMY  2007  . COLON RESECTION    . COLON SURGERY  2008   Done at [facility]  . COLONOSCOPY     Done at [facility]  . EYE SURGERY     lasix  . FLEXIBLE SIGMOIDOSCOPY N/A 10/20/2015   Procedure: FLEXIBLE SIGMOIDOSCOPY;  Surgeon: Rogene Houston, MD;  [facility];  Service: Endoscopy;  Laterality: N/A;  4 - Dr Laural Golden has meeting until 1:00  . FLEXIBLE SIGMOIDOSCOPY N/A 07/11/2016   Procedure: FLEXIBLE SIGMOIDOSCOPY;  Surgeon: Rogene Houston, MD;  [facility];  Service: Endoscopy;  Laterality: N/A;  1200  . FLEXIBLE SIGMOIDOSCOPY N/A 08/09/2017   Procedure: FLEXIBLE SIGMOIDOSCOPY;  Surgeon: Rogene Houston, MD;  [facility];  Service: Endoscopy;   Laterality: N/A;  1:00  . HERNIA REPAIR    . LIVER TRANSPLANT  [id number]  . POLYPECTOMY  08/09/2017   Procedure: POLYPECTOMY;  Surgeon: Rogene Houston, MD;  [facility];  Service: Endoscopy;;  colon small bowel  . REVERSE SHOULDER ARTHROPLASTY Left 07/17/2018  . REVERSE SHOULDER ARTHROPLASTY Left 07/17/2018   Procedure: LEFT REVERSE SHOULDER ARTHROPLASTY;  Surgeon: Justice Britain, MD;  Location: [place];  Service: Orthopedics;  Laterality: Left;  132min  . SPLENECTOMY  2006  . UPPER GASTROINTESTINAL ENDOSCOPY     Done at [facility]    There were no vitals filed for this visit.  Subjective Assessment - 08/20/18 0946    Subjective  Patient reports she feels no pain in her shoulder but her back is in a lot of pain.     Pertinent History  L reverse total shoulder replacement 07/17/18, OA    Limitations  Lifting;House hold activities    Currently in Pain?  No/denies         [facility] PT Assessment - 08/20/18 0001      Assessment   Medical Diagnosis  left reverse total shoulder replacement    Onset Date/Surgical Date  07/17/18    Hand Dominance  Right    Next MD Visit  September 05, 2018    Prior Therapy  yes      Precautions   Precautions  Shoulder    Type of Shoulder Precautions  see media for protocol                   [facility] Adult PT Treatment/Exercise - 08/20/18 0001      Exercises   Exercises  Shoulder      Shoulder Exercises: Pulleys   Flexion  5 minutes      Shoulder Exercises: ROM/Strengthening   Nustep  seated ranger flexion 1 minute      Shoulder Exercises: Isometric Strengthening   External Rotation  5X5"    ABduction  5X5"      Modalities   Modalities  Electrical Stimulation;Vasopneumatic      Electrical Stimulation   Electrical Stimulation Location  Left shoulder.    Electrical Stimulation Action  IFC    Electrical Stimulation Parameters  80-150 hz x15 min    Electrical Stimulation Goals  Pain      Vasopneumatic   Number Minutes Vasopneumatic    15 minutes    Vasopnuematic Location   Shoulder    Vasopneumatic Pressure  Low    Vasopneumatic Temperature   34      Manual Therapy   Manual Therapy  Passive ROM    Passive ROM  Gentle PROM to patient's left shoulder within protocol guidelines into flexion, IR, ER and ABD                  PT Long Term Goals - 08/20/18 6283      PT LONG TERM GOAL #1   Title  Patient will be independent with HEP and its progression    Time  6    Period  Weeks    Status  On-going      PT LONG TERM GOAL #2   Title  Patient will demonstrate 130+ degrees of left shoulder flexion AROM to improve ability to perform functional tasks    Time  6    Period  Weeks    Status  On-going      PT LONG TERM GOAL #3   Title  Patient will demonstrate 65+ degrees of left ER AROM to don/doff apparel.    Time  6    Period  Weeks    Status  On-going      PT LONG TERM GOAL #4   Title  Patient will demonstrate 4/5 or greater left shoulder MMT in all planes to improve stability during functional tasks.    Time  6    Period  Weeks    Status  On-going      PT LONG TERM GOAL #5   Title  Patient will report ability to perform all ADLs independently with less than 4/10 left shoulder pain.    Time  6    Period  Weeks    Status  On-going            Plan - 08/20/18 0947    Clinical Impression Statement  Patient was able to tolerate treatment well with no reports of increased pain in right shoulder. Patient was able to tolerate new seated UE ranger with no complaints and demonstrated good form with it. Patient pointed out an area of her shoulder where she did not know what it was. Patient educated it was a bony landmark. Patient's scar is well healed with some hypertrophic scarring in the inferior aspect of the scar. Normal response  to modalities upon removal.     Clinical Presentation  Stable    Clinical Decision Making  Low    Rehab Potential  Good    PT Frequency  3x / week    PT Duration  6 weeks     PT Treatment/Interventions  ADLs/Self Care Home Management;Cryotherapy;Electrical Stimulation;Ultrasound;Moist Heat;Therapeutic activities;Therapeutic exercise;Patient/family education;Manual techniques;Passive range of motion;Dry needling;Vasopneumatic Device;Neuromuscular re-education    PT Next Visit Plan  see protocol, 5 weeks post op 08/21/18; pulleys, isometrics ABD, ER, AAROM flexion to 90-100 degrees, 30 degrees ER in scapular plane, IR at 45 deg in scap plane; modalities PRN    Consulted and Agree with Plan of Care  Patient       Patient will benefit from skilled therapeutic intervention in order to improve the following deficits and impairments:  Decreased activity tolerance, Decreased range of motion, Decreased strength, Pain, Impaired UE functional use  Visit Diagnosis: Acute pain of left shoulder  Stiffness of left shoulder, not elsewhere classified  Muscle weakness (generalized)     Problem List Patient Active Problem List   Diagnosis Date Noted  . S/p reverse total shoulder arthroplasty 07/17/2018  . History of colon cancer 06/12/2018  . History of colonic polyps 06/12/2018  . 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  . Gastroesophageal reflux disease 06/12/2018  . Chronic diarrhea 06/26/2017  . Iron deficiency anemia 06/26/2017  . GAD (generalized anxiety disorder) 08/28/2016  . Vitamin D deficiency 08/28/2016  . GERD (gastroesophageal reflux disease) 08/28/2016  . Insomnia 08/28/2016  . Convulsions ([place]) 11/30/2015  . Liver transplant recipient [facility]) 11/30/2015  . Seizures ([place]) 09/27/2015  . Essential hypertension, benign 03/10/2015  . Hx of liver transplant ([place]) 12/08/2014  . Diarrhea 12/08/2014  . Colon cancer ([place]) 12/08/2014  . Hepatic encephalopathy ([place]) 10/26/2013  . Anemia of chronic disease 10/26/2013  . Personal history of colonic polyps 12/27/2012  . PBC (primary biliary cirrhosis) 03/24/2012  . Hypothyroidism 03/24/2012  . Ventral hernia, recurrent  03/24/2012   Gabriela Eves, PT, DPT 08/20/2018, 9:59 AM  [facility] [address] [place], [place], [address] Phone: [phone]   Fax:  [phone]  Name: Cassidy Barber MRN: [id number] Date of Birth: [birth year]

## 2018-08-21 ENCOUNTER — Other Ambulatory Visit: Payer: Self-pay

## 2018-08-21 ENCOUNTER — Ambulatory Visit (HOSPITAL_COMMUNITY)
Admission: RE | Admit: 2018-08-21 | Discharge: 2018-08-21 | Disposition: A | Discharge status: Home / Self Care | Payer: Medicare Other | Source: Ambulatory Visit | Attending: Internal Medicine | Admitting: Internal Medicine

## 2018-08-21 ENCOUNTER — Encounter (HOSPITAL_COMMUNITY)
Admission: RE | Disposition: A | Discharge status: Home / Self Care | Payer: Self-pay | Source: Ambulatory Visit | Attending: Internal Medicine

## 2018-08-21 ENCOUNTER — Encounter (HOSPITAL_COMMUNITY): Payer: Self-pay | Admitting: *Deleted

## 2018-08-21 DIAGNOSIS — K228 Other specified diseases of esophagus: Secondary | ICD-10-CM | POA: Insufficient documentation

## 2018-08-21 DIAGNOSIS — Z85828 Personal history of other malignant neoplasm of skin: Secondary | ICD-10-CM | POA: Insufficient documentation

## 2018-08-21 DIAGNOSIS — R1011 Right upper quadrant pain: Secondary | ICD-10-CM | POA: Diagnosis not present

## 2018-08-21 DIAGNOSIS — I10 Essential (primary) hypertension: Secondary | ICD-10-CM | POA: Insufficient documentation

## 2018-08-21 DIAGNOSIS — Z7982 Long term (current) use of aspirin: Secondary | ICD-10-CM | POA: Diagnosis not present

## 2018-08-21 DIAGNOSIS — Z944 Liver transplant status: Secondary | ICD-10-CM | POA: Insufficient documentation

## 2018-08-21 DIAGNOSIS — E079 Disorder of thyroid, unspecified: Secondary | ICD-10-CM | POA: Diagnosis not present

## 2018-08-21 DIAGNOSIS — Z1211 Encounter for screening for malignant neoplasm of colon: Secondary | ICD-10-CM | POA: Diagnosis not present

## 2018-08-21 DIAGNOSIS — K2960 Other gastritis without bleeding: Secondary | ICD-10-CM

## 2018-08-21 DIAGNOSIS — Z85038 Personal history of other malignant neoplasm of large intestine: Secondary | ICD-10-CM | POA: Insufficient documentation

## 2018-08-21 DIAGNOSIS — Z96612 Presence of left artificial shoulder joint: Secondary | ICD-10-CM | POA: Insufficient documentation

## 2018-08-21 DIAGNOSIS — Z881 Allergy status to other antibiotic agents status: Secondary | ICD-10-CM | POA: Insufficient documentation

## 2018-08-21 DIAGNOSIS — Z79890 Hormone replacement therapy (postmenopausal): Secondary | ICD-10-CM | POA: Diagnosis not present

## 2018-08-21 DIAGNOSIS — Z9049 Acquired absence of other specified parts of digestive tract: Secondary | ICD-10-CM | POA: Insufficient documentation

## 2018-08-21 DIAGNOSIS — Z800 Family history of malignant neoplasm of digestive organs: Secondary | ICD-10-CM | POA: Diagnosis not present

## 2018-08-21 DIAGNOSIS — Z980 Intestinal bypass and anastomosis status: Secondary | ICD-10-CM | POA: Insufficient documentation

## 2018-08-21 DIAGNOSIS — K3189 Other diseases of stomach and duodenum: Secondary | ICD-10-CM | POA: Insufficient documentation

## 2018-08-21 DIAGNOSIS — Z885 Allergy status to narcotic agent status: Secondary | ICD-10-CM | POA: Diagnosis not present

## 2018-08-21 DIAGNOSIS — K644 Residual hemorrhoidal skin tags: Secondary | ICD-10-CM | POA: Diagnosis not present

## 2018-08-21 DIAGNOSIS — Z86010 Personal history of colonic polyps: Secondary | ICD-10-CM | POA: Diagnosis not present

## 2018-08-21 DIAGNOSIS — Z1509 Genetic susceptibility to other malignant neoplasm: Secondary | ICD-10-CM | POA: Diagnosis not present

## 2018-08-21 DIAGNOSIS — D638 Anemia in other chronic diseases classified elsewhere: Secondary | ICD-10-CM | POA: Insufficient documentation

## 2018-08-21 DIAGNOSIS — Z08 Encounter for follow-up examination after completed treatment for malignant neoplasm: Secondary | ICD-10-CM | POA: Diagnosis not present

## 2018-08-21 DIAGNOSIS — Z79899 Other long term (current) drug therapy: Secondary | ICD-10-CM | POA: Insufficient documentation

## 2018-08-21 DIAGNOSIS — K219 Gastro-esophageal reflux disease without esophagitis: Secondary | ICD-10-CM | POA: Diagnosis not present

## 2018-08-21 DIAGNOSIS — K6289 Other specified diseases of anus and rectum: Secondary | ICD-10-CM

## 2018-08-21 HISTORY — PX: ESOPHAGOGASTRODUODENOSCOPY: SHX5428

## 2018-08-21 HISTORY — PX: FLEXIBLE SIGMOIDOSCOPY: SHX5431

## 2018-08-21 SURGERY — SIGMOIDOSCOPY, FLEXIBLE
Anesthesia: Moderate Sedation

## 2018-08-21 MED ORDER — MIDAZOLAM HCL 5 MG/5ML IJ SOLN
INTRAMUSCULAR | Status: DC | PRN
  Administered 2018-08-21 (×2): 1 mg via INTRAVENOUS
  Administered 2018-08-21 (×2): 2 mg via INTRAVENOUS

## 2018-08-21 MED ORDER — MEPERIDINE HCL 25 MG/ML IJ SOLN: INTRAMUSCULAR | Status: DC | PRN

## 2018-08-21 MED ORDER — LIDOCAINE VISCOUS HCL 2 % MT SOLN
OROMUCOSAL | Status: AC
  Filled 2018-08-21: qty 15

## 2018-08-21 MED ORDER — MIDAZOLAM HCL 5 MG/5ML IJ SOLN
INTRAMUSCULAR | Status: AC
  Filled 2018-08-21: qty 10

## 2018-08-21 MED ORDER — MEPERIDINE HCL 50 MG/ML IJ SOLN
INTRAMUSCULAR | Status: AC
  Filled 2018-08-21: qty 1

## 2018-08-21 MED ORDER — STERILE WATER FOR IRRIGATION IR SOLN
Status: DC | PRN
  Administered 2018-08-21: 09:00:00

## 2018-08-21 MED ORDER — SODIUM CHLORIDE 0.9 % IV SOLN
INTRAVENOUS | Status: DC
  Administered 2018-08-21: 09:00:00 via INTRAVENOUS

## 2018-08-21 MED ORDER — LIDOCAINE VISCOUS HCL 2 % MT SOLN
OROMUCOSAL | Status: DC | PRN
  Administered 2018-08-21: 5 mL via OROMUCOSAL

## 2018-08-21 MED ORDER — MEPERIDINE HCL 50 MG/ML IJ SOLN
INTRAMUSCULAR | Status: DC | PRN
  Administered 2018-08-21: 25 mg
  Administered 2018-08-21: 25 mg via INTRAVENOUS

## 2018-08-21 NOTE — H&P (Signed)
Cassidy Barber is an 67 y.o. female.   Chief Complaint: Patient is here for EGD and flexible sigmoidoscopy. HPI: Patient is 67 year old Caucasian female who has history of Lynch syndrome and is here for surveillance sigmoidoscopy.  She is status post subtotal colectomy with ileoproctostomy.  She is having 3-4 bowel movements per day.  She denies melena or rectal bleeding. Patient complains of right upper quadrant abdominal pain which occurs every evening.  She denies nausea vomiting.  She says heartburn is well controlled with PPI. She had emergency surgery for small bowel obstruction in May 2019.  Last month she had left shoulder arthroplasty.  She fell and injured her shoulder. Patient reports rise in alkaline phosphatase.  She saw Dr. Aletta Edouard few weeks ago.  She has been referred for hematology consultation because she also also has elevated platelet count. Urso dose was increased on her last visit. Family history significant for CRC and father sister and sister's child.  Her niece was 56 at the time of diagnosis(sister's child).  Past Medical History:  Diagnosis Date  . Anemia of chronic disease 10/26/2013  . Hernia of unspecified site of abdominal cavity without mention of obstruction or gangrene   . Hypertension   . Liver disease   . Lynch syndrome   . Lynch syndrome   . Primary biliary cirrhosis ([place]) 10/26/2013  . Thyroid disease     Past Surgical History:  Procedure Laterality Date  . ABDOMINAL HERNIA REPAIR     Patient's states that she has had 8- 9 hernia surgeries  . ABDOMINAL HYSTERECTOMY    . CHOLECYSTECTOMY  2007  . COLON RESECTION    . COLON SURGERY  2008   Done at [facility]  . COLONOSCOPY     Done at [facility]  . EYE SURGERY     lasix  . FLEXIBLE SIGMOIDOSCOPY N/A 10/20/2015   Procedure: FLEXIBLE SIGMOIDOSCOPY;  Surgeon: Rogene Houston, MD;  [facility];  Service: Endoscopy;  Laterality: N/A;  43 - Dr Laural Golden has meeting until 1:00  . FLEXIBLE SIGMOIDOSCOPY N/A  07/11/2016   Procedure: FLEXIBLE SIGMOIDOSCOPY;  Surgeon: Rogene Houston, MD;  [facility];  Service: Endoscopy;  Laterality: N/A;  1200  . FLEXIBLE SIGMOIDOSCOPY N/A 08/09/2017   Procedure: FLEXIBLE SIGMOIDOSCOPY;  Surgeon: Rogene Houston, MD;  [facility];  Service: Endoscopy;  Laterality: N/A;  1:00  . HERNIA REPAIR    . LIVER TRANSPLANT  [id number]  . multiple skin cancers removed    . POLYPECTOMY  08/09/2017   Procedure: POLYPECTOMY;  Surgeon: Rogene Houston, MD;  [facility];  Service: Endoscopy;;  colon small bowel  . REVERSE SHOULDER ARTHROPLASTY Left 07/17/2018  . REVERSE SHOULDER ARTHROPLASTY Left 07/17/2018   Procedure: LEFT REVERSE SHOULDER ARTHROPLASTY;  Surgeon: Justice Britain, MD;  Location: [place];  Service: Orthopedics;  Laterality: Left;  142mn  . SPLENECTOMY  2006  . UPPER GASTROINTESTINAL ENDOSCOPY     [facility]   Family History  Problem Relation Age of Onset  . Prostate cancer Father   . Colon cancer Father   . Colon cancer Sister   . Lung cancer Sister   . Healthy Son   . Alcohol abuse Brother    Social History:  reports that she has never smoked. She has never used smokeless tobacco. She reports that she does not drink alcohol or use drugs.  Allergies:  Allergies  Allergen Reactions  . Ciprofloxacin Itching  . Codeine  Nausea Only    Medications Prior to Admission  Medication Sig Dispense Refill  . acetaminophen (TYLENOL) 500 MG tablet Take 1,000 mg by mouth 2 (two) times daily as needed for moderate pain or headache.    . alendronate (FOSAMAX) 70 MG tablet Take 1 tablet (70 mg total) by mouth every 7 (seven) days. Take with a full glass of water on an empty stomach. 12 tablet 2  . ALPRAZolam (XANAX) 0.5 MG tablet Take 1 tablet (0.5 mg total) by mouth at bedtime as needed. (Patient taking differently: Take 0.5 mg by mouth at bedtime. ) 30 tablet 5  . aspirin EC 81 MG tablet Take 81 mg by mouth daily.    . Biotin 10000  MCG TABS Take 10,000 mcg by mouth daily.    [name] CALCIUM CITRATE PO Take 1,200 mg by mouth 2 (two) times daily.     . cetirizine (ZYRTEC) 10 MG tablet Take 0.5 tablets (5 mg total) by mouth at bedtime. 30 tablet 11  . cyclobenzaprine (FLEXERIL) 5 MG tablet Take 1 tablet (5 mg total) by mouth 3 (three) times daily as needed for muscle spasms. (Patient taking differently: Take 5 mg by mouth 2 (two) times daily as needed for muscle spasms. ) 60 tablet 1  . everolimus (AFINITOR) 5 MG tablet Take 12.5 mg by mouth 2 (two) times daily.    . ferrous sulfate 325 (65 FE) MG tablet Take 325 mg by mouth 2 (two) times daily with a meal.     . fluorouracil (EFUDEX) 5 % cream Apply 1 application topically daily as needed (cancer spots).   0  . HYDROcodone-acetaminophen (NORCO/VICODIN) 5-325 MG tablet Take 1 tablet by mouth 3 (three) times daily as needed for moderate pain.    . hydrocortisone cream 1 % Apply 1 application topically daily as needed for itching.    . levothyroxine (SYNTHROID) 125 MCG tablet Take 1 tablet (125 mcg total) by mouth daily. 90 tablet 1  . loratadine (CLARITIN) 10 MG tablet Take 1 tablet (10 mg total) by mouth daily. 90 tablet 3  . losartan (COZAAR) 25 MG tablet Take 1 tablet (25 mg total) by mouth daily. 90 tablet 0  . magnesium oxide (MAG-OX) 400 MG tablet Take 400 mg by mouth daily.    . metoprolol succinate (TOPROL-XL) 25 MG 24 hr tablet TAKE 1 TABLET BY MOUTH ONCE DAILY 90 tablet 1  . ondansetron (ZOFRAN) 4 MG tablet Take 4 mg by mouth every 8 (eight) hours as needed for nausea or vomiting.    [name] oxyCODONE (OXY IR/ROXICODONE) 5 MG immediate release tablet Take 5 mg by mouth 2 (two) times daily as needed for severe pain.    . pantoprazole (PROTONIX) 40 MG tablet Take 1 tablet (40 mg total) by mouth daily before breakfast. 90 tablet 2  . propranolol (INDERAL) 80 MG tablet Take 80 mg by mouth daily.     . ranitidine (ZANTAC) 150 MG tablet Take 1 tablet (150 mg total) by mouth 2 (two) times  daily. 60 tablet 6  . tacrolimus (PROGRAF) 1 MG capsule Take 2 mg by mouth 2 (two) times daily.     . ursodiol (ACTIGALL) 250 MG tablet Take 250 mg by mouth 2 (two) times daily.    . vitamin B-12 (CYANOCOBALAMIN) 1000 MCG tablet Take 1,000 mcg by mouth daily.    . Vitamin D, Ergocalciferol, (DRISDOL) 50000 units CAPS capsule TAKE 1 CAPSULE BY MOUTH ONCE A WEEK 12 capsule 0  . vitamin E (VITAMIN  E) 400 UNIT capsule Take 400 Units by mouth daily.    . fluticasone (FLONASE) 50 MCG/ACT nasal spray Place 2 sprays into both nostrils daily. (Patient taking differently: Place 2 sprays into both nostrils daily as needed for allergies. ) 16 g 6  . hydrOXYzine (ATARAX/VISTARIL) 25 MG tablet TAKE 1 TABLET THREE TIMES DAILY AS NEEDED FOR ITCHING 60 tablet 1  . loperamide (IMODIUM) 2 MG capsule TAKE 2 CAPSULES 4 TIMES DAILY AS NEEDED (Patient taking differently: Take 4 mg by mouth 4 (four) times daily as needed for diarrhea or loose stools. ) 90 capsule 1    Results for orders placed or performed in visit on 08/20/18 (from the past 48 hour(s))  Alpha-Gal Panel     Status: None (Preliminary result)   Collection Time: 08/20/18 10:11 AM  Result Value Ref Range   Beef (Bos spp) IgE WILL FOLLOW    Class Interpretation WILL FOLLOW    Lamb/Mutton (Ovis spp) IgE WILL FOLLOW    Class Interpretation WILL FOLLOW    Pork (Sus spp) IgE WILL FOLLOW    Class Interpretation WILL FOLLOW    Alpha Gal IgE* WILL FOLLOW   CMP14+EGFR     Status: Abnormal   Collection Time: 08/20/18 10:11 AM  Result Value Ref Range   Glucose 97 65 - 99 mg/dL   BUN 22 8 - 27 mg/dL   Creatinine, Ser 0.86 0.57 - 1.00 mg/dL   GFR calc non Af Amer 70 >59 mL/min/1.73   GFR calc Af Amer 81 >59 mL/min/1.73   BUN/Creatinine Ratio 26 12 - 28   Sodium 138 134 - 144 mmol/L   Potassium 4.6 3.5 - 5.2 mmol/L   Chloride 99 96 - 106 mmol/L   CO2 23 20 - 29 mmol/L   Calcium 9.1 8.7 - 10.3 mg/dL   Total Protein 6.7 6.0 - 8.5 g/dL   Albumin 3.6 3.6 -  4.8 g/dL   Globulin, Total 3.1 1.5 - 4.5 g/dL   Albumin/Globulin Ratio 1.2 1.2 - 2.2   Bilirubin Total <0.2 0.0 - 1.2 mg/dL   Alkaline Phosphatase 289 (H) 39 - 117 IU/L   AST 21 0 - 40 IU/L   ALT 12 0 - 32 IU/L  CBC with Differential/Platelet     Status: Abnormal   Collection Time: 08/20/18 10:11 AM  Result Value Ref Range   WBC 6.4 3.4 - 10.8 x10E3/uL   RBC 3.97 3.77 - 5.28 x10E6/uL   Hemoglobin 10.0 (L) 11.1 - 15.9 g/dL   Hematocrit 34.3 34.0 - 46.6 %   MCV 86 79 - 97 fL   MCH 25.2 (L) 26.6 - 33.0 pg   MCHC 29.2 (L) 31.5 - 35.7 g/dL   RDW 15.4 12.3 - 15.4 %   Platelets 482 (H) 150 - 450 x10E3/uL   Neutrophils 38 Not Estab. %   Lymphs 38 Not Estab. %   Monocytes 14 Not Estab. %   Eos 8 Not Estab. %   Basos 2 Not Estab. %   Neutrophils Absolute 2.5 1.4 - 7.0 x10E3/uL   Lymphocytes Absolute 2.4 0.7 - 3.1 x10E3/uL   Monocytes Absolute 0.9 0.1 - 0.9 x10E3/uL   EOS (ABSOLUTE) 0.5 (H) 0.0 - 0.4 x10E3/uL   Basophils Absolute 0.1 0.0 - 0.2 x10E3/uL   Immature Granulocytes 0 Not Estab. %   Immature Grans (Abs) 0.0 0.0 - 0.1 x10E3/uL  Magnesium     Status: None   Collection Time: 08/20/18 10:11 AM  Result Value Ref Range  Magnesium 1.7 1.6 - 2.3 mg/dL  Tacrolimus Level     Status: None (Preliminary result)   Collection Time: 08/20/18 10:11 AM  Result Value Ref Range   Tacrolimus Lvl WILL FOLLOW   Everolimus     Status: None (Preliminary result)   Collection Time: 08/20/18 10:11 AM  Result Value Ref Range   Everolimus WILL FOLLOW    No results found.  ROS  Blood pressure (!) 138/96, pulse 82, temperature 97.6 F (36.4 C), temperature source Oral, resp. rate 20, height 5' 4" (1.626 m), weight 63.6 kg, SpO2 100 %. Physical Exam  Constitutional: She appears well-developed and well-nourished.  HENT:  Mouth/Throat: Oropharynx is clear and moist.  Eyes: Conjunctivae are normal. No scleral icterus.  Neck: No thyromegaly present.  Cardiovascular: Normal rate, regular rhythm and  normal heart sounds.  No murmur heard. Respiratory: Effort normal and breath sounds normal.  GI:  Abdomen is asymmetric with fullness below the right costal margin.  Multiple abdominal scars.  Abdomen is soft and nontender with organomegaly or masses.  Musculoskeletal: She exhibits no edema.  Lymphadenopathy:    She has no cervical adenopathy.  Neurological: She is alert.  Skin: Skin is warm and dry.     Assessment/Plan Right upper quadrant abdominal pain. Chronic GERD.  Symptoms well controlled with PPI. History of colon carcinoma and Lynch syndrome. Diagnostic EGD and surveillance flexible sigmoidoscopy.  Hildred Laser, MD 08/21/2018, 9:04 AM

## 2018-08-21 NOTE — Op Note (Signed)
[facility] Patient Name: Cassidy Barber Procedure Date: 08/21/2018 9:29 AM MRN: [id number] Date of Birth: 11-26-1950 Attending MD: Hildred Laser , MD CSN: [id number] Age: 67 Admit Type: Outpatient Procedure:                Flexible Sigmoidoscopy Indications:              High risk colon cancer surveillance: Personal                            history of colon cancer; Known Lynch syndrome. Providers:                Hildred Laser, MD, Jeanann Lewandowsky. Sharon Seller, RN, Nelma Rothman, Technician Referring MD:             Theador Hawthorne. Hawks, FNP Medicines:                Midazolam 1 mg IV Complications:            No immediate complications. Estimated Blood Loss:     Estimated blood loss: none. Procedure:                Pre-Anesthesia Assessment:                           - Prior to the procedure, a History and Physical                            was performed, and patient medications and                            allergies were reviewed. The patient's tolerance of                            previous anesthesia was also reviewed. The risks                            and benefits of the procedure and the sedation                            options and risks were discussed with the patient.                            All questions were answered, and informed consent                            was obtained. Prior Anticoagulants: The patient                            last took aspirin 1 day prior to the procedure. ASA                            Grade Assessment: III - A patient with severe  systemic disease. After reviewing the risks and                            benefits, the patient was deemed in satisfactory                            condition to undergo the procedure.                           After obtaining informed consent, the scope was                            passed under direct vision. The [id number]-[id number]   ([id number]) scope was introduced through the anus and                            advanced to the the ileo-sigmoid anastomosis. The                            flexible sigmoidoscopy was accomplished without                            difficulty. The patient tolerated the procedure                            well. The quality of the bowel preparation was                            adequate. Scope In: 9:31:49 AM Scope Out: 9:43:54 AM Total Procedure Duration: 0 hours 12 minutes 5 seconds  Findings:      The perianal and digital rectal examinations were normal.      The ileum appeared normal.      There was evidence of a prior end-to-end ileo-colonic anastomosis at 26       cm proximal to the anus. This was patent. The anastomosis was traversed.      The distal sigmoid colon appeared normal.      The rectum appeared normal.      External hemorrhoids were found during retroflexion. The hemorrhoids       were small.      Anal papilla(e) were hypertrophied. Impression:               - The neo-terminal ileum is normal.                           - Patent end-to-end ileo-colonic anastomosis.                           - The distal sigmoid colon is normal.                           - The rectum is normal.                           - External hemorrhoids.                           -  Anal papilla(e) were hypertrophied.                           - No specimens collected. Moderate Sedation:      Moderate (conscious) sedation was administered by the endoscopy nurse       and supervised by the endoscopist. The following parameters were       monitored: oxygen saturation, heart rate, blood pressure, CO2       capnography and response to care. Total physician intraservice time was       15 minutes. Recommendation:           - Discharge patient to home (with spouse).                           - Resume previous diet today.                           - Continue present medications.                            - Repeat flexible sigmoidoscopy in 1 year for                            surveillance. Procedure Code(s):        --- Professional ---                           [phone], Sigmoidoscopy, flexible; diagnostic,                            including collection of specimen(s) by brushing or                            washing, when performed (separate procedure)                           G0500, Moderate sedation services provided by the                            same physician or other qualified health care                            professional performing a gastrointestinal                            endoscopic service that sedation supports,                            requiring the presence of an independent trained                            observer to assist in the monitoring of the                            patient's level of consciousness and physiological  status; initial 15 minutes of intra-service time;                            patient age 32 years or older (additional time may                            be reported with [phone], as appropriate) Diagnosis Code(s):        --- Professional ---                           [phone], Personal history of other malignant                            neoplasm of large intestine                           K64.4, Residual hemorrhoidal skin tags                           Z98.0, Intestinal bypass and anastomosis status                           K62.89, Other specified diseases of anus and rectum CPT copyright 2018 [employer]. All rights reserved. The codes documented in this report are preliminary and upon coder review may  be revised to meet current compliance requirements. Hildred Laser, MD Hildred Laser, MD 08/21/2018 10:02:52 AM This report has been signed electronically. Number of Addenda: 0

## 2018-08-21 NOTE — Discharge Instructions (Signed)
Resume usual medications including aspirin as before. Resume usual diet. No driving for 24 hours. Physician will call with results of blood test. Next flexible sigmoidoscopy in 1 year.   Upper Endoscopy, Care After Refer to this sheet in the next few weeks. These instructions provide you with information about caring for yourself after your procedure. Your health care provider may also give you more specific instructions. Your treatment has been planned according to current medical practices, but problems sometimes occur. Call your health care provider if you have any problems or questions after your procedure. What can I expect after the procedure? After the procedure, it is common to have:  A sore throat.  Bloating.  Nausea.  Follow these instructions at home:  Follow instructions from your health care provider about what to eat or drink after your procedure.  Return to your normal activities as told by your health care provider. Ask your health care provider what activities are safe for you.  Take over-the-counter and prescription medicines only as told by your health care provider.  Do not drive for 24 hours if you received a sedative.  Keep all follow-up visits as told by your health care provider. This is important. Contact a health care provider if:  You have a sore throat that lasts longer than one day.  You have trouble swallowing. Get help right away if:  You have a fever.  You vomit blood or your vomit looks like coffee grounds.  You have bloody, black, or tarry stools.  You have a severe sore throat or you cannot swallow.  You have difficulty breathing.  You have severe pain in your chest or belly. This information is not intended to replace advice given to you by your health care provider. Make sure you discuss any questions you have with your health care provider. Document Released: 04/29/2012 Document Revised: 04/05/2016 Document Reviewed:  08/11/2015 Elsevier Interactive Patient Education  [employer].

## 2018-08-21 NOTE — Op Note (Signed)
[facility] Patient Name: Cassidy Barber Procedure Date: 08/21/2018 9:17 AM MRN: [id number] Date of Birth: [birth year] Attending MD: Hildred Laser , MD CSN: [id number] Age: 67 Admit Type: Outpatient Procedure:                Upper GI endoscopy Indications:              Abdominal pain in the right upper quadrant Providers:                Hildred Laser, MD, Otis Peak B. Sharon Seller, RN, Nelma Rothman, Technician Referring MD:             Theador Hawthorne. Hawks, FNP Medicines:                Lidocaine spray, Meperidine 50 mg IV, Midazolam 5                            mg IV Complications:            No immediate complications. Estimated Blood Loss:     Estimated blood loss: none. Procedure:                Pre-Anesthesia Assessment:                           - Prior to the procedure, a History and Physical                            was performed, and patient medications and                            allergies were reviewed. The patient's tolerance of                            previous anesthesia was also reviewed. The risks                            and benefits of the procedure and the sedation                            options and risks were discussed with the patient.                            All questions were answered, and informed consent                            was obtained. Prior Anticoagulants: The patient                            last took aspirin 1 day prior to the procedure. ASA                            Grade Assessment: III - A patient with severe  systemic disease. After reviewing the risks and                            benefits, the patient was deemed in satisfactory                            condition to undergo the procedure.                           After obtaining informed consent, the endoscope was                            passed under direct vision. Throughout the                            procedure, the  patient's blood pressure, pulse, and                            oxygen saturations were monitored continuously. The                            GIF-H190 ([id number]) scope was introduced through the                            mouth, and advanced to the second part of duodenum.                            The upper GI endoscopy was accomplished without                            difficulty. The patient tolerated the procedure                            well. Scope In: 9:20:42 AM Scope Out: 9:28:02 AM Total Procedure Duration: 0 hours 7 minutes 20 seconds  Findings:      The examined esophagus was normal.      The Z-line was irregular and was found 40 cm from the incisors.      A few erosions were found in the gastric antrum.      The exam of the stomach was otherwise normal.      The duodenal bulb and second portion of the duodenum were normal. Impression:               - Normal esophagus.                           - Z-line irregular, 40 cm from the incisors.                           - Erosive gastropathy.                           - Normal duodenal bulb and second portion of the  duodenum.                           - No specimens collected. Moderate Sedation:      Moderate (conscious) sedation was administered by the endoscopy nurse       and supervised by the endoscopist. The following parameters were       monitored: oxygen saturation, heart rate, blood pressure, CO2       capnography and response to care. Total physician intraservice time was       13 minutes. Recommendation:           - Patient has a contact number available for                            emergencies. The signs and symptoms of potential                            delayed complications were discussed with the                            patient. Return to normal activities tomorrow.                            Written discharge instructions were provided to the                             patient.                           - Resume previous diet today.                           - Continue present medications.                           - Perform an H. pylori serology today. Procedure Code(s):        --- Professional ---                           [phone], Esophagogastroduodenoscopy, flexible,                            transoral; diagnostic, including collection of                            specimen(s) by brushing or washing, when performed                            (separate procedure)                           G0500, Moderate sedation services provided by the                            same physician or other qualified health care  professional performing a gastrointestinal                            endoscopic service that sedation supports,                            requiring the presence of an independent trained                            observer to assist in the monitoring of the                            patient's level of consciousness and physiological                            status; initial 15 minutes of intra-service time;                            patient age 50 years or older (additional time may                            be reported with [phone], as appropriate) Diagnosis Code(s):        --- Professional ---                           K22.8, Other specified diseases of esophagus                           K31.89, Other diseases of stomach and duodenum                           R10.11, Right upper quadrant pain CPT copyright 2018 American Medical Association. All rights reserved. The codes documented in this report are preliminary and upon coder review may  be revised to meet current compliance requirements. Hildred Laser, MD Hildred Laser, MD 08/21/2018 9:56:43 AM This report has been signed electronically. Number of Addenda: 0

## 2018-08-22 ENCOUNTER — Ambulatory Visit: Payer: Medicare Other | Admitting: Physical Therapy

## 2018-08-22 ENCOUNTER — Encounter: Payer: Self-pay | Admitting: Physical Therapy

## 2018-08-22 DIAGNOSIS — M25612 Stiffness of left shoulder, not elsewhere classified: Secondary | ICD-10-CM

## 2018-08-22 DIAGNOSIS — M6281 Muscle weakness (generalized): Secondary | ICD-10-CM | POA: Diagnosis not present

## 2018-08-22 DIAGNOSIS — M25512 Pain in left shoulder: Secondary | ICD-10-CM | POA: Diagnosis not present

## 2018-08-22 DIAGNOSIS — G8929 Other chronic pain: Secondary | ICD-10-CM | POA: Diagnosis not present

## 2018-08-22 LAB — JAK2 GENOTYPR

## 2018-08-22 LAB — H. PYLORI ANTIBODY, IGG: H PYLORI IGG: 0.48 {index_val} (ref 0.00–0.79)

## 2018-08-22 NOTE — Therapy (Signed)
[facility] [place], [place], [address] Phone: [phone]   Fax:  [phone]  Physical Therapy Treatment  Patient Details  Name: Cassidy Barber MRN: [id number] Date of Birth: 07-19-1951 Referring Provider (PT): Jenetta Loges, [place]   Encounter Date: 08/22/2018  PT End of Session - 08/22/18 0940    Visit Number  7    Number of Visits  18    Date for PT Re-Evaluation  09/17/18    Authorization Type  FOTO every 5th visit, Progress note every 10th visit    PT Start Time  0900    PT Stop Time  0956    PT Time Calculation (min)  56 min    Activity Tolerance  Patient tolerated treatment well    Behavior During Therapy  [facility] for tasks assessed/performed       Past Medical History:  Diagnosis Date  . Anemia of chronic disease 10/26/2013  . Hernia of unspecified site of abdominal cavity without mention of obstruction or gangrene   . Hypertension   . Liver disease   . Lynch syndrome   . Lynch syndrome   . Primary biliary cirrhosis ([place]) 10/26/2013  . Thyroid disease     Past Surgical History:  Procedure Laterality Date  . ABDOMINAL HERNIA REPAIR     Patient's states that she has had 8- 9 hernia surgeries  . ABDOMINAL HYSTERECTOMY    . CHOLECYSTECTOMY  2007  . COLON RESECTION    . COLON SURGERY  2008   Done at [facility]  . COLONOSCOPY     Done at [facility]  . EYE SURGERY     lasix  . FLEXIBLE SIGMOIDOSCOPY N/A 10/20/2015   Procedure: FLEXIBLE SIGMOIDOSCOPY;  Surgeon: Rogene Houston, MD;  [facility];  Service: Endoscopy;  Laterality: N/A;  77 - Dr Laural Golden has meeting until 1:00  . FLEXIBLE SIGMOIDOSCOPY N/A 07/11/2016   Procedure: FLEXIBLE SIGMOIDOSCOPY;  Surgeon: Rogene Houston, MD;  [facility];  Service: Endoscopy;  Laterality: N/A;  1200  . FLEXIBLE SIGMOIDOSCOPY N/A 08/09/2017   Procedure: FLEXIBLE SIGMOIDOSCOPY;  Surgeon: Rogene Houston, MD;  [facility];  Service: Endoscopy;   Laterality: N/A;  1:00  . HERNIA REPAIR    . LIVER TRANSPLANT  [id number]  . multiple skin cancers removed    . POLYPECTOMY  08/09/2017   Procedure: POLYPECTOMY;  Surgeon: Rogene Houston, MD;  [facility];  Service: Endoscopy;;  colon small bowel  . REVERSE SHOULDER ARTHROPLASTY Left 07/17/2018  . REVERSE SHOULDER ARTHROPLASTY Left 07/17/2018   Procedure: LEFT REVERSE SHOULDER ARTHROPLASTY;  Surgeon: Justice Britain, MD;  Location: [place];  Service: Orthopedics;  Laterality: Left;  119min  . SPLENECTOMY  2006  . UPPER GASTROINTESTINAL ENDOSCOPY     Done at [facility]    There were no vitals filed for this visit.  Subjective Assessment - 08/22/18 0911    Subjective  Patient reports more pain in the back than the shoulder today. Reports no pain in shoulder today.    Pertinent History  L reverse total shoulder replacement 07/17/18, OA    Limitations  Lifting;House hold activities    Patient Stated Goals  use arm again and return to gardening    Currently in Pain?  No/denies    Pain Score  0-No pain         [facility] PT Assessment - 08/22/18 0001      Assessment   Medical Diagnosis  left  reverse total shoulder replacement                   [facility] Adult PT Treatment/Exercise - 08/22/18 0001      Exercises   Exercises  Shoulder      Shoulder Exercises: Supine   Protraction  AAROM;Both;10 reps    Flexion  AAROM;Both;10 reps    Flexion Limitations  elbow flexed      Shoulder Exercises: Pulleys   Flexion  5 minutes      Shoulder Exercises: ROM/Strengthening   Nustep  seated ranger flexion/ clockwise, counter clockwise 3 minutes each      Modalities   Modalities  Electrical Stimulation;Vasopneumatic      Electrical Stimulation   Electrical Stimulation Location  Left shoulder.    Electrical Stimulation Action  IFC    Electrical Stimulation Parameters  I80-150 hz x15 min    Electrical Stimulation Goals  Pain      Vasopneumatic   Number Minutes Vasopneumatic   15  minutes    Vasopnuematic Location   Shoulder    Vasopneumatic Pressure  Low    Vasopneumatic Temperature   34      Manual Therapy   Manual Therapy  Passive ROM    Passive ROM  Gentle PROM to patient's left shoulder within protocol guidelines into flexion, IR, ER and ABD                  PT Long Term Goals - 08/20/18 8127      PT LONG TERM GOAL #1   Title  Patient will be independent with HEP and its progression    Time  6    Period  Weeks    Status  On-going      PT LONG TERM GOAL #2   Title  Patient will demonstrate 130+ degrees of left shoulder flexion AROM to improve ability to perform functional tasks    Time  6    Period  Weeks    Status  On-going      PT LONG TERM GOAL #3   Title  Patient will demonstrate 65+ degrees of left ER AROM to don/doff apparel.    Time  6    Period  Weeks    Status  On-going      PT LONG TERM GOAL #4   Title  Patient will demonstrate 4/5 or greater left shoulder MMT in all planes to improve stability during functional tasks.    Time  6    Period  Weeks    Status  On-going      PT LONG TERM GOAL #5   Title  Patient will report ability to perform all ADLs independently with less than 4/10 left shoulder pain.    Time  6    Period  Weeks    Status  On-going            Plan - 08/22/18 0941    Clinical Impression Statement  Patient was able to tolerate progression of treatment well with intermittent reports of muscle fatigue. Patient was able to perform all new AAROM exercises with good form after explanation and tactile cuing. Patient educated on protocol and how we are still in the phase of AAROM. Patient reported understanding. Normal response to modalities upon removal.    Clinical Presentation  Stable    Clinical Decision Making  Low    Rehab Potential  Good    PT Frequency  3x / week    PT  Duration  6 weeks    PT Treatment/Interventions  ADLs/Self Care Home Management;Cryotherapy;Electrical  Stimulation;Ultrasound;Moist Heat;Therapeutic activities;Therapeutic exercise;Patient/family education;Manual techniques;Passive range of motion;Dry needling;Vasopneumatic Device;Neuromuscular re-education    PT Next Visit Plan  see protocol, 5 weeks post op 08/21/18; pulleys, isometrics ABD, ER, AAROM flexion PROM flexion to 90-100 degrees, 30 degrees ER in scapular plane, IR at 45 deg in scap plane; modalities PRN    Consulted and Agree with Plan of Care  Patient       Patient will benefit from skilled therapeutic intervention in order to improve the following deficits and impairments:  Decreased activity tolerance, Decreased range of motion, Decreased strength, Pain, Impaired UE functional use  Visit Diagnosis: Acute pain of left shoulder  Stiffness of left shoulder, not elsewhere classified  Muscle weakness (generalized)     Problem List Patient Active Problem List   Diagnosis Date Noted  . S/p reverse total shoulder arthroplasty 07/17/2018  . History of colon cancer 06/12/2018  . History of colonic polyps 06/12/2018  . 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  . Gastroesophageal reflux disease 06/12/2018  . Chronic diarrhea 06/26/2017  . Iron deficiency anemia 06/26/2017  . GAD (generalized anxiety disorder) 08/28/2016  . Vitamin D deficiency 08/28/2016  . GERD (gastroesophageal reflux disease) 08/28/2016  . Insomnia 08/28/2016  . Convulsions ([place]) 11/30/2015  . Liver transplant recipient [facility]) 11/30/2015  . Seizures ([place]) 09/27/2015  . Essential hypertension, benign 03/10/2015  . Hx of liver transplant ([place]) 12/08/2014  . Diarrhea 12/08/2014  . Colon cancer ([place]) 12/08/2014  . Hepatic encephalopathy ([place]) 10/26/2013  . Anemia of chronic disease 10/26/2013  . Personal history of colonic polyps 12/27/2012  . PBC (primary biliary cirrhosis) 03/24/2012  . Hypothyroidism 03/24/2012  . Ventral hernia, recurrent 03/24/2012    Gabriela Eves, PT, DPT 08/22/2018, 9:50 AM  [facility] [place], [place], [address] Phone: [phone]   Fax:  [phone]  Name: ARNIKA LARZELERE MRN: [id number] Date of Birth: 1951-03-19

## 2018-08-25 ENCOUNTER — Ambulatory Visit: Payer: Medicare Other | Admitting: Physical Therapy

## 2018-08-25 DIAGNOSIS — M25612 Stiffness of left shoulder, not elsewhere classified: Secondary | ICD-10-CM | POA: Diagnosis not present

## 2018-08-25 DIAGNOSIS — M6281 Muscle weakness (generalized): Secondary | ICD-10-CM | POA: Diagnosis not present

## 2018-08-25 DIAGNOSIS — G8929 Other chronic pain: Secondary | ICD-10-CM | POA: Diagnosis not present

## 2018-08-25 DIAGNOSIS — M25512 Pain in left shoulder: Secondary | ICD-10-CM

## 2018-08-25 LAB — CMP14+EGFR
A/G RATIO: 1.2 (ref 1.2–2.2)
ALT: 12 IU/L (ref 0–32)
AST: 21 IU/L (ref 0–40)
Albumin: 3.6 g/dL (ref 3.6–4.8)
Alkaline Phosphatase: 289 IU/L — ABNORMAL HIGH (ref 39–117)
BUN/Creatinine Ratio: 26 (ref 12–28)
BUN: 22 mg/dL (ref 8–27)
CALCIUM: 9.1 mg/dL (ref 8.7–10.3)
CHLORIDE: 99 mmol/L (ref 96–106)
CO2: 23 mmol/L (ref 20–29)
Creatinine, Ser: 0.86 mg/dL (ref 0.57–1.00)
GFR calc Af Amer: 81 mL/min/{1.73_m2} (ref >59)
GFR calc non Af Amer: 70 mL/min/{1.73_m2} (ref >59)
GLOBULIN, TOTAL: 3.1 g/dL (ref 1.5–4.5)
Glucose: 97 mg/dL (ref 65–99)
POTASSIUM: 4.6 mmol/L (ref 3.5–5.2)
SODIUM: 138 mmol/L (ref 134–144)
TOTAL PROTEIN: 6.7 g/dL (ref 6.0–8.5)

## 2018-08-25 LAB — CBC WITH DIFFERENTIAL/PLATELET
Basophils Absolute: 0.1 10*3/uL (ref 0.0–0.2)
Basos: 2 %
EOS (ABSOLUTE): 0.5 10*3/uL — ABNORMAL HIGH (ref 0.0–0.4)
Eos: 8 %
Hematocrit: 34.3 % (ref 34.0–46.6)
Hemoglobin: 10 g/dL — ABNORMAL LOW (ref 11.1–15.9)
Immature Grans (Abs): 0 10*3/uL (ref 0.0–0.1)
Immature Granulocytes: 0 %
Lymphocytes Absolute: 2.4 10*3/uL (ref 0.7–3.1)
Lymphs: 38 %
MCH: 25.2 pg — ABNORMAL LOW (ref 26.6–33.0)
MCHC: 29.2 g/dL — ABNORMAL LOW (ref 31.5–35.7)
MCV: 86 fL (ref 79–97)
Monocytes Absolute: 0.9 10*3/uL (ref 0.1–0.9)
Monocytes: 14 %
Neutrophils Absolute: 2.5 10*3/uL (ref 1.4–7.0)
Neutrophils: 38 %
Platelets: 482 10*3/uL — ABNORMAL HIGH (ref 150–450)
RBC: 3.97 x10E6/uL (ref 3.77–5.28)
RDW: 15.4 % (ref 12.3–15.4)
WBC: 6.4 10*3/uL (ref 3.4–10.8)

## 2018-08-25 LAB — ALPHA-GAL PANEL
Alpha Gal IgE*: <0.1 kU/L
Beef (Bos spp) IgE: <0.1 kU/L
Class Interpretation: 0
Class Interpretation: 0
Class Interpretation: 0
Lamb/Mutton (Ovis spp) IgE: <0.1 kU/L
Pork (Sus spp) IgE: <0.1 kU/L

## 2018-08-25 LAB — BCR-ABL1, CML/ALL, PCR, QUANT

## 2018-08-25 LAB — EVEROLIMUS: Everolimus: 7.5 ng/mL (ref 3.0–8.0)

## 2018-08-25 LAB — TACROLIMUS LEVEL: Tacrolimus Lvl: 5.2 ng/mL (ref 2.0–20.0)

## 2018-08-25 LAB — MAGNESIUM: MAGNESIUM: 1.7 mg/dL (ref 1.6–2.3)

## 2018-08-25 NOTE — Therapy (Signed)
[facility] [place], [place], [address] Phone: [phone]   Fax:  [phone]  Physical Therapy Treatment  Patient Details  Name: Cassidy Barber MRN: [id number] Date of Birth: 1951/03/12 Referring Provider (PT): Jenetta Loges, [place]   Encounter Date: 08/25/2018  PT End of Session - 08/25/18 0945    Visit Number  8    Number of Visits  18    Date for PT Re-Evaluation  09/17/18    Authorization Type  FOTO every 5th visit, Progress note every 10th visit    PT Start Time  0900    PT Stop Time  0957    PT Time Calculation (min)  57 min    Activity Tolerance  Patient tolerated treatment well    Behavior During Therapy  [facility] for tasks assessed/performed       Past Medical History:  Diagnosis Date  . Anemia of chronic disease 10/26/2013  . Hernia of unspecified site of abdominal cavity without mention of obstruction or gangrene   . Hypertension   . Liver disease   . Lynch syndrome   . Lynch syndrome   . Primary biliary cirrhosis ([place]) 10/26/2013  . Thyroid disease     Past Surgical History:  Procedure Laterality Date  . ABDOMINAL HERNIA REPAIR     Patient's states that she has had 8- 9 hernia surgeries  . ABDOMINAL HYSTERECTOMY    . CHOLECYSTECTOMY  2007  . COLON RESECTION    . COLON SURGERY  2008   Done at [facility]  . COLONOSCOPY     Done at [facility]  . EYE SURGERY     lasix  . FLEXIBLE SIGMOIDOSCOPY N/A 10/20/2015   Procedure: FLEXIBLE SIGMOIDOSCOPY;  Surgeon: Rogene Houston, MD;  [facility];  Service: Endoscopy;  Laterality: N/A;  97 - Dr Laural Golden has meeting until 1:00  . FLEXIBLE SIGMOIDOSCOPY N/A 07/11/2016   Procedure: FLEXIBLE SIGMOIDOSCOPY;  Surgeon: Rogene Houston, MD;  [facility];  Service: Endoscopy;  Laterality: N/A;  1200  . FLEXIBLE SIGMOIDOSCOPY N/A 08/09/2017   Procedure: FLEXIBLE SIGMOIDOSCOPY;  Surgeon: Rogene Houston, MD;  [facility];  Service: Endoscopy;   Laterality: N/A;  1:00  . HERNIA REPAIR    . LIVER TRANSPLANT  [id number]  . multiple skin cancers removed    . POLYPECTOMY  08/09/2017   Procedure: POLYPECTOMY;  Surgeon: Rogene Houston, MD;  [facility];  Service: Endoscopy;;  colon small bowel  . REVERSE SHOULDER ARTHROPLASTY Left 07/17/2018  . REVERSE SHOULDER ARTHROPLASTY Left 07/17/2018   Procedure: LEFT REVERSE SHOULDER ARTHROPLASTY;  Surgeon: Justice Britain, MD;  Location: [facility];  Service: Orthopedics;  Laterality: Left;  115min  . SPLENECTOMY  2006  . UPPER GASTROINTESTINAL ENDOSCOPY     Done at [facility]    There were no vitals filed for this visit.  Subjective Assessment - 08/25/18 1056    Subjective  Patient denies pain, just soreness at incision.    Pertinent History  L reverse total shoulder replacement 07/17/18, OA    Limitations  Lifting;House hold activities    Patient Stated Goals  use arm again and return to gardening    Currently in Pain?  No/denies         [facility] PT Assessment - 08/25/18 0001      Assessment   Medical Diagnosis  left reverse total shoulder replacement  [facility] Adult PT Treatment/Exercise - 08/25/18 0001      Exercises   Exercises  Shoulder      Shoulder Exercises: Supine   Protraction  AAROM;Both;10 reps    External Rotation  AAROM;Left;10 reps    Flexion  AAROM;Both;10 reps    Flexion Limitations  elbow flexed      Shoulder Exercises: Standing   Other Standing Exercises  wall ladder x5 to level 20      Shoulder Exercises: Pulleys   Flexion  5 minutes      Shoulder Exercises: ROM/Strengthening   Nustep  seated ranger flexion/ clockwise, counter clockwise 3 minutes each      Shoulder Exercises: Isometric Strengthening   Extension  5X5"    Extension Limitations  supine with PT assist    External Rotation  5X5"    External Rotation Limitations  supine with PT assist    ABduction  5X5"    ABduction Limitations  supine with PT assist       Modalities   Modalities  Electrical Stimulation;Vasopneumatic      Electrical Stimulation   Electrical Stimulation Location  left shoulder    Electrical Stimulation Action  IFC    Electrical Stimulation Parameters  80-150 hz x15 min    Electrical Stimulation Goals  Pain      Vasopneumatic   Number Minutes Vasopneumatic   15 minutes    Vasopnuematic Location   Shoulder    Vasopneumatic Pressure  Low    Vasopneumatic Temperature   42      Manual Therapy   Manual Therapy  Passive ROM    Passive ROM  Gentle PROM to patient's left shoulder within protocol guidelines into flexion, IR, ER and ABD; rhythmic stabilization at 90 degrees flex/extension                  PT Long Term Goals - 08/20/18 0932      PT LONG TERM GOAL #1   Title  Patient will be independent with HEP and its progression    Time  6    Period  Weeks    Status  On-going      PT LONG TERM GOAL #2   Title  Patient will demonstrate 130+ degrees of left shoulder flexion AROM to improve ability to perform functional tasks    Time  6    Period  Weeks    Status  On-going      PT LONG TERM GOAL #3   Title  Patient will demonstrate 65+ degrees of left ER AROM to don/doff apparel.    Time  6    Period  Weeks    Status  On-going      PT LONG TERM GOAL #4   Title  Patient will demonstrate 4/5 or greater left shoulder MMT in all planes to improve stability during functional tasks.    Time  6    Period  Weeks    Status  On-going      PT LONG TERM GOAL #5   Title  Patient will report ability to perform all ADLs independently with less than 4/10 left shoulder pain.    Time  6    Period  Weeks    Status  On-going            Plan - 08/25/18 1049    Clinical Impression Statement  Patient was able to tolerate progression of treatment well with minimal reports of pain. Patient's PROM is smooth to ranges  within protocol. Patient provided with HEP of AAROM with cane. Patient reported understanding. Normal  response to modalities upon removal.     Clinical Presentation  Stable    Clinical Decision Making  Low    Rehab Potential  Good    PT Frequency  3x / week    PT Duration  6 weeks    PT Treatment/Interventions  ADLs/Self Care Home Management;Cryotherapy;Electrical Stimulation;Ultrasound;Moist Heat;Therapeutic activities;Therapeutic exercise;Patient/family education;Manual techniques;Passive range of motion;Dry needling;Vasopneumatic Device;Neuromuscular re-education    PT Next Visit Plan  see protocol, 5 weeks post op 08/21/18; pulleys, isometrics ABD, ER, AAROM flexion PROM flexion to 90-100 degrees, 30 degrees ER in scapular plane, IR at 45 deg in scap plane; modalities PRN    PT Home Exercise Plan  see patient education section    Consulted and Agree with Plan of Care  Patient       Patient will benefit from skilled therapeutic intervention in order to improve the following deficits and impairments:  Decreased activity tolerance, Decreased range of motion, Decreased strength, Pain, Impaired UE functional use  Visit Diagnosis: Stiffness of left shoulder, not elsewhere classified  Muscle weakness (generalized)  Acute pain of left shoulder     Problem List Patient Active Problem List   Diagnosis Date Noted  . S/p reverse total shoulder arthroplasty 07/17/2018  . History of colon cancer 06/12/2018  . History of colonic polyps 06/12/2018  . 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  . Gastroesophageal reflux disease 06/12/2018  . Chronic diarrhea 06/26/2017  . Iron deficiency anemia 06/26/2017  . GAD (generalized anxiety disorder) 08/28/2016  . Vitamin D deficiency 08/28/2016  . GERD (gastroesophageal reflux disease) 08/28/2016  . Insomnia 08/28/2016  . Convulsions ([place]) 11/30/2015  . Liver transplant recipient [facility]) 11/30/2015  . Seizures ([place]) 09/27/2015  . Essential hypertension, benign 03/10/2015  . Hx of liver transplant ([place]) 12/08/2014  . Diarrhea 12/08/2014  . Colon cancer ([place])  12/08/2014  . Hepatic encephalopathy ([place]) 10/26/2013  . Anemia of chronic disease 10/26/2013  . Personal history of colonic polyps 12/27/2012  . PBC (primary biliary cirrhosis) 03/24/2012  . Hypothyroidism 03/24/2012  . Ventral hernia, recurrent 03/24/2012    Gabriela Eves, PT, DPT 08/25/2018, 10:57 AM  [facility] [address] [place], [place], [address] Phone: [phone]   Fax:  [phone]  Name: Cassidy Barber MRN: [id number] Date of Birth: February 01, 1951

## 2018-08-26 ENCOUNTER — Encounter: Payer: Self-pay | Admitting: Family Medicine

## 2018-08-27 ENCOUNTER — Encounter: Payer: Self-pay | Admitting: Physical Therapy

## 2018-08-27 ENCOUNTER — Ambulatory Visit: Payer: Medicare Other | Admitting: Physical Therapy

## 2018-08-27 DIAGNOSIS — M25512 Pain in left shoulder: Secondary | ICD-10-CM | POA: Diagnosis not present

## 2018-08-27 DIAGNOSIS — M25612 Stiffness of left shoulder, not elsewhere classified: Secondary | ICD-10-CM | POA: Diagnosis not present

## 2018-08-27 DIAGNOSIS — M6281 Muscle weakness (generalized): Secondary | ICD-10-CM

## 2018-08-27 DIAGNOSIS — G8929 Other chronic pain: Secondary | ICD-10-CM | POA: Diagnosis not present

## 2018-08-27 NOTE — Therapy (Signed)
[facility] [place], [place], [address] Phone: [phone]   Fax:  [phone]  Physical Therapy Treatment  Patient Details  Name: Cassidy Barber MRN: [id number] Date of Birth: 1951/03/08 Referring Provider (PT): Jenetta Loges, [place]   Encounter Date: 08/27/2018  PT End of Session - 08/27/18 0908    Visit Number  9    Number of Visits  18    Date for PT Re-Evaluation  09/17/18    Authorization Type  FOTO every 5th visit, Progress note every 10th visit    PT Start Time  0903    PT Stop Time  0944    PT Time Calculation (min)  41 min    Activity Tolerance  Patient tolerated treatment well    Behavior During Therapy  [facility] for tasks assessed/performed       Past Medical History:  Diagnosis Date  . Anemia of chronic disease 10/26/2013  . Hernia of unspecified site of abdominal cavity without mention of obstruction or gangrene   . Hypertension   . Liver disease   . Lynch syndrome   . Lynch syndrome   . Primary biliary cirrhosis ([place]) 10/26/2013  . Thyroid disease     Past Surgical History:  Procedure Laterality Date  . ABDOMINAL HERNIA REPAIR     Patient's states that she has had 8- 9 hernia surgeries  . ABDOMINAL HYSTERECTOMY    . CHOLECYSTECTOMY  2007  . COLON RESECTION    . COLON SURGERY  2008   Done at [facility]  . COLONOSCOPY     Done at [facility]  . EYE SURGERY     lasix  . FLEXIBLE SIGMOIDOSCOPY N/A 10/20/2015   Procedure: FLEXIBLE SIGMOIDOSCOPY;  Surgeon: Rogene Houston, MD;  [facility];  Service: Endoscopy;  Laterality: N/A;  62 - Dr Laural Golden has meeting until 1:00  . FLEXIBLE SIGMOIDOSCOPY N/A 07/11/2016   Procedure: FLEXIBLE SIGMOIDOSCOPY;  Surgeon: Rogene Houston, MD;  [facility];  Service: Endoscopy;  Laterality: N/A;  1200  . FLEXIBLE SIGMOIDOSCOPY N/A 08/09/2017   Procedure: FLEXIBLE SIGMOIDOSCOPY;  Surgeon: Rogene Houston, MD;  [facility];  Service: Endoscopy;   Laterality: N/A;  1:00  . HERNIA REPAIR    . LIVER TRANSPLANT  [id number]  . multiple skin cancers removed    . POLYPECTOMY  08/09/2017   Procedure: POLYPECTOMY;  Surgeon: Rogene Houston, MD;  [facility];  Service: Endoscopy;;  colon small bowel  . REVERSE SHOULDER ARTHROPLASTY Left 07/17/2018  . REVERSE SHOULDER ARTHROPLASTY Left 07/17/2018   Procedure: LEFT REVERSE SHOULDER ARTHROPLASTY;  Surgeon: Justice Britain, MD;  Location: [place];  Service: Orthopedics;  Laterality: Left;  155min  . SPLENECTOMY  2006  . UPPER GASTROINTESTINAL ENDOSCOPY     Done at [facility]    There were no vitals filed for this visit.  Subjective Assessment - 08/27/18 0906    Subjective  Patient brought in equiptment for home pulley.    Pertinent History  L reverse total shoulder replacement 07/17/18, OA    Limitations  Lifting;House hold activities    Patient Stated Goals  use arm again and return to gardening    Currently in Pain?  No/denies         [facility] PT Assessment - 08/27/18 0001      Assessment   Medical Diagnosis  left reverse total shoulder replacement    Referring Provider (PT)  Jenetta Loges, PA-C    Onset  Date/Surgical Date  07/17/18    Hand Dominance  Right    Next MD Visit  September 05, 2018    Prior Therapy  yes      Precautions   Precautions  Shoulder    Type of Shoulder Precautions  see media for protocol                   [facility] Adult PT Treatment/Exercise - 08/27/18 0001      Exercises   Exercises  Shoulder      Shoulder Exercises: Supine   Protraction  AAROM;Both;20 reps    External Rotation  AAROM;Both;20 reps    Flexion  AAROM;Both;20 reps      Shoulder Exercises: Standing   Other Standing Exercises  wall ladder x5 to level 20      Shoulder Exercises: Pulleys   Flexion  3 minutes      Shoulder Exercises: ROM/Strengthening   Nustep  seated ranger flexion/ clockwise, counter clockwise x5 min      Shoulder Exercises: Isometric Strengthening    Extension  5X5";Supine    External Rotation  5X5";Supine    Internal Rotation  5X5";Supine      Manual Therapy   Manual Therapy  Passive ROM    Passive ROM  PROM of L shoulder into flex, ER, IR with gentle holds at end range                  PT Long Term Goals - 08/20/18 0953      PT LONG TERM GOAL #1   Title  Patient will be independent with HEP and its progression    Time  6    Period  Weeks    Status  On-going      PT LONG TERM GOAL #2   Title  Patient will demonstrate 130+ degrees of left shoulder flexion AROM to improve ability to perform functional tasks    Time  6    Period  Weeks    Status  On-going      PT LONG TERM GOAL #3   Title  Patient will demonstrate 65+ degrees of left ER AROM to don/doff apparel.    Time  6    Period  Weeks    Status  On-going      PT LONG TERM GOAL #4   Title  Patient will demonstrate 4/5 or greater left shoulder MMT in all planes to improve stability during functional tasks.    Time  6    Period  Weeks    Status  On-going      PT LONG TERM GOAL #5   Title  Patient will report ability to perform all ADLs independently with less than 4/10 left shoulder pain.    Time  6    Period  Weeks    Status  On-going            Plan - 08/27/18 0953    Clinical Impression Statement  Patient tolerated today's treatment well with no reports of any L shoulder pain upon arrival. Patient's pulley system was made in clinic with instruction of how to use at home. Patient able to tolerate all exercises well with no complaints of any increased pain. Firm end feels and smooth arc of motion noted with PROM of L shoulder. Patient opted out of modalities today.    Rehab Potential  Good    PT Frequency  3x / week    PT Duration  6 weeks  PT Treatment/Interventions  ADLs/Self Care Home Management;Cryotherapy;Electrical Stimulation;Ultrasound;Moist Heat;Therapeutic activities;Therapeutic exercise;Patient/family education;Manual  techniques;Passive range of motion;Dry needling;Vasopneumatic Device;Neuromuscular re-education    PT Next Visit Plan  see protocol, 5 weeks post op 08/21/18; pulleys, isometrics ABD, ER, AAROM flexion PROM flexion to 90-100 degrees, 30 degrees ER in scapular plane, IR at 45 deg in scap plane; modalities PRN    PT Home Exercise Plan  see patient education section    Consulted and Agree with Plan of Care  Patient       Patient will benefit from skilled therapeutic intervention in order to improve the following deficits and impairments:  Decreased activity tolerance, Decreased range of motion, Decreased strength, Pain, Impaired UE functional use  Visit Diagnosis: Stiffness of left shoulder, not elsewhere classified  Muscle weakness (generalized)  Acute pain of left shoulder     Problem List Patient Active Problem List   Diagnosis Date Noted  . S/p reverse total shoulder arthroplasty 07/17/2018  . History of colon cancer 06/12/2018  . History of colonic polyps 06/12/2018  . 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  . Gastroesophageal reflux disease 06/12/2018  . Chronic diarrhea 06/26/2017  . Iron deficiency anemia 06/26/2017  . GAD (generalized anxiety disorder) 08/28/2016  . Vitamin D deficiency 08/28/2016  . GERD (gastroesophageal reflux disease) 08/28/2016  . Insomnia 08/28/2016  . Convulsions ([place]) 11/30/2015  . Liver transplant recipient [facility]) 11/30/2015  . Seizures ([place]) 09/27/2015  . Essential hypertension, benign 03/10/2015  . Hx of liver transplant ([place]) 12/08/2014  . Diarrhea 12/08/2014  . Colon cancer ([place]) 12/08/2014  . Hepatic encephalopathy ([place]) 10/26/2013  . Anemia of chronic disease 10/26/2013  . Personal history of colonic polyps 12/27/2012  . PBC (primary biliary cirrhosis) 03/24/2012  . Hypothyroidism 03/24/2012  . Ventral hernia, recurrent 03/24/2012    Standley Brooking, PTA 08/27/2018, 9:57 AM  [facility] [address] [place], [place], [address] Phone: [phone]   Fax:  [phone]  Name: Cassidy Barber MRN: [id number] Date of Birth: [birth year]

## 2018-08-29 ENCOUNTER — Encounter: Payer: Self-pay | Admitting: Physical Therapy

## 2018-08-29 ENCOUNTER — Ambulatory Visit: Payer: Medicare Other | Admitting: Physical Therapy

## 2018-08-29 DIAGNOSIS — M25512 Pain in left shoulder: Secondary | ICD-10-CM

## 2018-08-29 DIAGNOSIS — G8929 Other chronic pain: Secondary | ICD-10-CM | POA: Diagnosis not present

## 2018-08-29 DIAGNOSIS — M6281 Muscle weakness (generalized): Secondary | ICD-10-CM | POA: Diagnosis not present

## 2018-08-29 DIAGNOSIS — M25612 Stiffness of left shoulder, not elsewhere classified: Secondary | ICD-10-CM

## 2018-08-29 DIAGNOSIS — Z944 Liver transplant status: Secondary | ICD-10-CM | POA: Diagnosis not present

## 2018-08-29 NOTE — Therapy (Signed)
[facility] [place], [place], [address] Phone: [phone]   Fax:  [phone]  Physical Therapy Treatment  Progress Note Reporting Period 08/06/18 to 08/29/18  See note below for Objective Data and Assessment of Progress/Goals.      Patient Details  Name: Cassidy Barber MRN: [id number] Date of Birth: [birth year] Referring Provider (PT): Jenetta Loges, [place]   Encounter Date: 08/29/2018  PT End of Session - 08/29/18 0912    Visit Number  10    Number of Visits  18    Date for PT Re-Evaluation  09/17/18    Authorization Type  FOTO every 5th visit, Progress note every 10th visit    PT Start Time  0900    PT Stop Time  0951    PT Time Calculation (min)  51 min    Activity Tolerance  Patient tolerated treatment well    Behavior During Therapy  [facility] for tasks assessed/performed       Past Medical History:  Diagnosis Date  . Anemia of chronic disease 10/26/2013  . Hernia of unspecified site of abdominal cavity without mention of obstruction or gangrene   . Hypertension   . Liver disease   . Lynch syndrome   . Lynch syndrome   . Primary biliary cirrhosis ([place]) 10/26/2013  . Thyroid disease     Past Surgical History:  Procedure Laterality Date  . ABDOMINAL HERNIA REPAIR     Patient's states that she has had 8- 9 hernia surgeries  . ABDOMINAL HYSTERECTOMY    . CHOLECYSTECTOMY  2007  . COLON RESECTION    . COLON SURGERY  2008   Done at [facility]  . COLONOSCOPY     Done at [facility]  . ESOPHAGOGASTRODUODENOSCOPY N/A 08/21/2018   Procedure: ESOPHAGOGASTRODUODENOSCOPY (EGD);  Surgeon: Rogene Houston, MD;  [facility];  Service: Endoscopy;  Laterality: N/A;  . EYE SURGERY     lasix  . FLEXIBLE SIGMOIDOSCOPY N/A 10/20/2015   Procedure: FLEXIBLE SIGMOIDOSCOPY;  Surgeon: Rogene Houston, MD;  [facility];  Service: Endoscopy;  Laterality: N/A;  55 - Dr Laural Golden has meeting until 1:00  . FLEXIBLE  SIGMOIDOSCOPY N/A 07/11/2016   Procedure: FLEXIBLE SIGMOIDOSCOPY;  Surgeon: Rogene Houston, MD;  [facility];  Service: Endoscopy;  Laterality: N/A;  1200  . FLEXIBLE SIGMOIDOSCOPY N/A 08/09/2017   Procedure: FLEXIBLE SIGMOIDOSCOPY;  Surgeon: Rogene Houston, MD;  [facility];  Service: Endoscopy;  Laterality: N/A;  1:00  . FLEXIBLE SIGMOIDOSCOPY N/A 08/21/2018   Procedure: FLEXIBLE SIGMOIDOSCOPY;  Surgeon: Rogene Houston, MD;  [facility];  Service: Endoscopy;  Laterality: N/A;  . HERNIA REPAIR    . LIVER TRANSPLANT  [id number]  . multiple skin cancers removed    . POLYPECTOMY  08/09/2017   Procedure: POLYPECTOMY;  Surgeon: Rogene Houston, MD;  [facility];  Service: Endoscopy;;  colon small bowel  . REVERSE SHOULDER ARTHROPLASTY Left 07/17/2018  . REVERSE SHOULDER ARTHROPLASTY Left 07/17/2018   Procedure: LEFT REVERSE SHOULDER ARTHROPLASTY;  Surgeon: Justice Britain, MD;  Location: [place];  Service: Orthopedics;  Laterality: Left;  167min  . SPLENECTOMY  2006  . UPPER GASTROINTESTINAL ENDOSCOPY     Done at [facility]    There were no vitals filed for this visit.  Subjective Assessment - 08/29/18 0912    Subjective  Patient reports being compliant with HEP.    Pertinent History  L reverse total shoulder  replacement 07/17/18, OA    Limitations  Lifting;House hold activities    Patient Stated Goals  use arm again and return to gardening    Currently in Pain?  No/denies         [facility] PT Assessment - 08/29/18 0001      Assessment   Medical Diagnosis  left reverse total shoulder replacement    Referring Provider (PT)  Jenetta Loges, PA-C    Onset Date/Surgical Date  07/17/18    Hand Dominance  Right    Next MD Visit  September 05, 2018    Prior Therapy  yes                   [facility] Adult PT Treatment/Exercise - 08/29/18 0001      Exercises   Exercises  Shoulder      Shoulder Exercises: Supine   Protraction  AAROM;Both;20 reps     External Rotation  AAROM;Both;20 reps    Flexion  AAROM;Both;20 reps      Shoulder Exercises: Pulleys   Flexion  5 minutes      Shoulder Exercises: Isometric Strengthening   Flexion  Supine;5X5"    Extension  5X5";Supine    External Rotation  5X5";Supine    Internal Rotation  5X5";Supine    ABduction  5X5";Supine      Modalities   Modalities  Vasopneumatic      Acupuncturist Location  --    Sport and exercise psychologist Parameters  --    Printmaker Goals  --      Vasopneumatic   Number Minutes Vasopneumatic   15 minutes    Vasopnuematic Location   Shoulder    Vasopneumatic Pressure  Low    Vasopneumatic Temperature   36      Manual Therapy   Manual Therapy  Passive ROM    Passive ROM  PROM of L shoulder into flex, ER, IR with gentle holds at end range                  PT Long Term Goals - 08/20/18 0953      PT LONG TERM GOAL #1   Title  Patient will be independent with HEP and its progression    Time  6    Period  Weeks    Status  On-going      PT LONG TERM GOAL #2   Title  Patient will demonstrate 130+ degrees of left shoulder flexion AROM to improve ability to perform functional tasks    Time  6    Period  Weeks    Status  On-going      PT LONG TERM GOAL #3   Title  Patient will demonstrate 65+ degrees of left ER AROM to don/doff apparel.    Time  6    Period  Weeks    Status  On-going      PT LONG TERM GOAL #4   Title  Patient will demonstrate 4/5 or greater left shoulder MMT in all planes to improve stability during functional tasks.    Time  6    Period  Weeks    Status  On-going      PT LONG TERM GOAL #5   Title  Patient will report ability to perform all ADLs independently with less than 4/10 left shoulder pain.    Time  6    Period  Weeks    Status  On-going            Plan - 08/29/18 0956    Clinical Impression Statement  Patient was able to  tolerate treatment well with no reports of pain throughout exercises. Patient noted with good smooth PROM. Patient reported she will only be able to come one visit next week. Patient instructed to perform exercises at home to maintain gains made in therapy. Patient reported understanding. Patient requested vaso only as she is not in pain and has a TENs unit at home.    Clinical Presentation  Stable    Clinical Decision Making  Low    Rehab Potential  Good    PT Frequency  3x / week    PT Duration  6 weeks    PT Treatment/Interventions  ADLs/Self Care Home Management;Cryotherapy;Electrical Stimulation;Ultrasound;Moist Heat;Therapeutic activities;Therapeutic exercise;Patient/family education;Manual techniques;Passive range of motion;Dry needling;Vasopneumatic Device;Neuromuscular re-education    PT Next Visit Plan  see protocol, 5 weeks post op 08/21/18; pulleys, isometrics ABD, ER, AAROM flexion PROM flexion to 90-100 degrees, 30 degrees ER in scapular plane, IR at 45 deg in scap plane; modalities PRN    PT Home Exercise Plan  see patient education section    Consulted and Agree with Plan of Care  Patient       Patient will benefit from skilled therapeutic intervention in order to improve the following deficits and impairments:  Decreased activity tolerance, Decreased range of motion, Decreased strength, Pain, Impaired UE functional use  Visit Diagnosis: Stiffness of left shoulder, not elsewhere classified  Muscle weakness (generalized)  Acute pain of left shoulder     Problem List Patient Active Problem List   Diagnosis Date Noted  . S/p reverse total shoulder arthroplasty 07/17/2018  . History of colon cancer 06/12/2018  . History of colonic polyps 06/12/2018  . 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  . Gastroesophageal reflux disease 06/12/2018  . Chronic diarrhea 06/26/2017  . Iron deficiency anemia 06/26/2017  . GAD (generalized anxiety disorder) 08/28/2016  . Vitamin D deficiency  08/28/2016  . GERD (gastroesophageal reflux disease) 08/28/2016  . Insomnia 08/28/2016  . Convulsions ([place]) 11/30/2015  . Liver transplant recipient [facility]) 11/30/2015  . Seizures ([place]) 09/27/2015  . Essential hypertension, benign 03/10/2015  . Hx of liver transplant ([place]) 12/08/2014  . Diarrhea 12/08/2014  . Colon cancer ([place]) 12/08/2014  . Hepatic encephalopathy ([place]) 10/26/2013  . Anemia of chronic disease 10/26/2013  . Personal history of colonic polyps 12/27/2012  . PBC (primary biliary cirrhosis) 03/24/2012  . Hypothyroidism 03/24/2012  . Ventral hernia, recurrent 03/24/2012    Gabriela Eves, PT, DPT 08/29/2018, 10:37 AM  [facility] [address] [place], [place], [address] Phone: [phone]   Fax:  [phone]  Name: Cassidy Barber MRN: [id number] Date of Birth: 08/20/1951

## 2018-09-02 ENCOUNTER — Inpatient Hospital Stay (HOSPITAL_BASED_OUTPATIENT_CLINIC_OR_DEPARTMENT_OTHER): Payer: Medicare Other | Admitting: Internal Medicine

## 2018-09-02 ENCOUNTER — Other Ambulatory Visit: Payer: Self-pay

## 2018-09-02 ENCOUNTER — Encounter (HOSPITAL_COMMUNITY): Payer: Self-pay | Admitting: Internal Medicine

## 2018-09-02 VITALS — BP 124/66 | HR 80 | Temp 98.1°F | Resp 16 | Wt 143.5 lb

## 2018-09-02 DIAGNOSIS — M7918 Myalgia, other site: Secondary | ICD-10-CM | POA: Diagnosis not present

## 2018-09-02 DIAGNOSIS — D473 Essential (hemorrhagic) thrombocythemia: Secondary | ICD-10-CM

## 2018-09-02 DIAGNOSIS — F411 Generalized anxiety disorder: Secondary | ICD-10-CM | POA: Diagnosis not present

## 2018-09-02 DIAGNOSIS — Z1509 Genetic susceptibility to other malignant neoplasm: Secondary | ICD-10-CM

## 2018-09-02 DIAGNOSIS — E039 Hypothyroidism, unspecified: Secondary | ICD-10-CM | POA: Diagnosis not present

## 2018-09-02 DIAGNOSIS — E559 Vitamin D deficiency, unspecified: Secondary | ICD-10-CM | POA: Diagnosis not present

## 2018-09-02 DIAGNOSIS — D509 Iron deficiency anemia, unspecified: Secondary | ICD-10-CM

## 2018-09-02 DIAGNOSIS — I10 Essential (primary) hypertension: Secondary | ICD-10-CM | POA: Diagnosis not present

## 2018-09-02 DIAGNOSIS — Z944 Liver transplant status: Secondary | ICD-10-CM

## 2018-09-02 DIAGNOSIS — R21 Rash and other nonspecific skin eruption: Secondary | ICD-10-CM | POA: Diagnosis not present

## 2018-09-02 DIAGNOSIS — D75839 Thrombocytosis, unspecified: Secondary | ICD-10-CM

## 2018-09-02 DIAGNOSIS — D638 Anemia in other chronic diseases classified elsewhere: Secondary | ICD-10-CM | POA: Diagnosis not present

## 2018-09-02 DIAGNOSIS — Z85038 Personal history of other malignant neoplasm of large intestine: Secondary | ICD-10-CM | POA: Diagnosis not present

## 2018-09-02 DIAGNOSIS — Z79899 Other long term (current) drug therapy: Secondary | ICD-10-CM | POA: Diagnosis not present

## 2018-09-02 DIAGNOSIS — D508 Other iron deficiency anemias: Secondary | ICD-10-CM

## 2018-09-02 DIAGNOSIS — M25559 Pain in unspecified hip: Secondary | ICD-10-CM | POA: Diagnosis not present

## 2018-09-02 NOTE — Progress Notes (Signed)
Diagnosis Thrombocytosis (HCC) - Plan: CBC with Differential/Platelet, Comprehensive metabolic panel, Lactate dehydrogenase, Ferritin  Other iron deficiency anemia - Plan: CBC with Differential/Platelet, Comprehensive metabolic panel, Lactate dehydrogenase, Ferritin  Staging Cancer Staging No matching staging information was found for the patient.  Assessment and Plan:  1.  Thrombocytosis.  Pt has history of a liver transplant.  She reports she was told in the past she had elevated platelet count.  She has also had elevations in her alkaline phosphatase.  She is followed by the [place] transplant service at [facility].  Labs done 08/11/2018 showed a white count of 8.6 hemoglobin 10.7 platelets 716,000.  She has a normal differential.  Chemistries within normal limits with a potassium of 4.6 and creatinine of 0.88.  It was noted her alkaline phosphatase was elevated at 546.  Liver function tests were normal bilirubin was 0.2.  She reports she had recent shoulder surgery.  Denies any headaches or blurred vision.  Patient is seen today for consultation due to thrombocytosis.  Labs done 08/19/2018 reviewed and showed a white count 8.4 hemoglobin 11.3 platelets 504,000.  She has a normal differential.  Ferritin was 33, LDH 196, potassium was 4.4, creatinine was 0.83,  She has normal liver function tests, alkaline phosphatase was 319.  Sed rate was elevated at 33.    Pt has negative Jak 2 and BCR/Abl.   I suspect this is a reactive process as it was noted her platelet count is improved at 504,000 and sed rate is elevated at 33..  Pt also has evidence of Iron deficiency anemia with ferritin of 33.  She has an intolerance to oral iron.  Pt will be given a trial of Injectafer 750 mg IV D1 and D8.  Side effects of the medication reviewed with pt which include but are not limited to allergic reaction.  She will be seen for follow-up in 10/2018 with repeat labs after IV iron.    2.  IDA.  Ferritin noted to be 33.  Pt has  an intolerance to oral iron.  She will be given a trial of Injectafer 750 mg IV D1 and D8. Side effects of the medication reviewed with pt which include but are not limited to allergic reaction.  She will have repeat labs in 10/2018 for follow-up after IV iron.    3.  Liver transplant.  Pt is followed by the liver transplant team.  AST and ALT are normal.  She has a bilirubin of 0.4.  Alkaline phosphatase is 319 on labs done 08/19/2018.  She should follow-up with transplant team at [facility] as recommended.  4.  Lynch syndrome.  The patient has undergone colectomy.  She reports she does mammogram screening on a consistent basis.  5.  Hypertension.  Blood pressure is 124/66.   Follow-up with PCP.  6.  Joint pain.  She reports recent shoulder surgery.  SPEP negative.  She has normal CRP and RF.  Sed rate is elevated at 33.  Follow-up with PCP or rheumatology as recommended.   7.  Rash.  Pt reports this responds to steroids and antihistamines.  She will discuss this further with transplant team.  Pt may need referral for allergy evaluation.    25  minutes spent with more than 50% spent in counseling and coordination of care.  Historical data obtained from note dated 08/19/2018:  67 year old female who has a history of a liver transplant.  She reports she was told in the past she had elevated platelet count.  She has also had elevations in her alkaline phosphatase.  She is followed by the [place] transplant service at [facility].  Labs there were done 08/11/2018 showed a white count of 8.6 hemoglobin 10.7 platelets 716,000.  She has a normal differential.  Chemistries within normal limits with a potassium of 4.6 and creatinine of 0.88.  It was noted her alkaline phosphatase was elevated at 546.  Liver function tests were normal bilirubin was 0.2.  She reports she had recent shoulder surgery.  Denies any headaches or blurred vision.  Patient is seen today for consultation due to thrombocytosis.  Current Status:  Pt is  here to go over labs.  She is complaining of rash.     Problem List Patient Active Problem List   Diagnosis Date Noted  . S/p reverse total shoulder arthroplasty [Z96.619] 07/17/2018  . History of colon cancer [Z85.038] 06/12/2018  . History of colonic polyps [Z86.010] 06/12/2018  . Lynch syndrome [Z15.09] 06/12/2018  . Gastroesophageal reflux disease [K21.9] 06/12/2018  . Chronic diarrhea [K52.9] 06/26/2017  . Iron deficiency anemia [D50.9] 06/26/2017  . GAD (generalized anxiety disorder) [F41.1] 08/28/2016  . Vitamin D deficiency [E55.9] 08/28/2016  . GERD (gastroesophageal reflux disease) [K21.9] 08/28/2016  . Insomnia [G47.00] 08/28/2016  . Convulsions ([place]) [R56.9] 11/30/2015  . Liver transplant recipient [facility]) [Z94.4] 11/30/2015  . Seizures ([place]) [R56.9] 09/27/2015  . Essential hypertension, benign [I10] 03/10/2015  . Hx of liver transplant ([place]) [Z94.4] 12/08/2014  . Diarrhea [R19.7] 12/08/2014  . Colon cancer ([place]) [C18.9] 12/08/2014  . Hepatic encephalopathy ([place]) [K72.90] 10/26/2013  . Anemia of chronic disease [D63.8] 10/26/2013  . Personal history of colonic polyps [Z86.010] 12/27/2012  . PBC (primary biliary cirrhosis) [K74.3] 03/24/2012  . Hypothyroidism [E03.9] 03/24/2012  . Ventral hernia, recurrent [K43.2] 03/24/2012    Past Medical History Past Medical History:  Diagnosis Date  . Anemia of chronic disease 10/26/2013  . Hernia of unspecified site of abdominal cavity without mention of obstruction or gangrene   . Hypertension   . Liver disease   . Lynch syndrome   . Lynch syndrome   . Primary biliary cirrhosis ([place]) 10/26/2013  . Thyroid disease     Past Surgical History Past Surgical History:  Procedure Laterality Date  . ABDOMINAL HERNIA REPAIR     Patient's states that she has had 8- 9 hernia surgeries  . ABDOMINAL HYSTERECTOMY    . CHOLECYSTECTOMY  2007  . COLON RESECTION    . COLON SURGERY  2008   Done at [facility]  . COLONOSCOPY     Done at [facility]   . ESOPHAGOGASTRODUODENOSCOPY N/A 08/21/2018   Procedure: ESOPHAGOGASTRODUODENOSCOPY (EGD);  Surgeon: Rogene Houston, MD;  [facility];  Service: Endoscopy;  Laterality: N/A;  . EYE SURGERY     lasix  . FLEXIBLE SIGMOIDOSCOPY N/A 10/20/2015   Procedure: FLEXIBLE SIGMOIDOSCOPY;  Surgeon: Rogene Houston, MD;  [facility];  Service: Endoscopy;  Laterality: N/A;  18 - Dr Laural Golden has meeting until 1:00  . FLEXIBLE SIGMOIDOSCOPY N/A 07/11/2016   Procedure: FLEXIBLE SIGMOIDOSCOPY;  Surgeon: Rogene Houston, MD;  [facility];  Service: Endoscopy;  Laterality: N/A;  1200  . FLEXIBLE SIGMOIDOSCOPY N/A 08/09/2017   Procedure: FLEXIBLE SIGMOIDOSCOPY;  Surgeon: Rogene Houston, MD;  [facility];  Service: Endoscopy;  Laterality: N/A;  1:00  . FLEXIBLE SIGMOIDOSCOPY N/A 08/21/2018   Procedure: FLEXIBLE SIGMOIDOSCOPY;  Surgeon: Rogene Houston, MD;  [facility];  Service: Endoscopy;  Laterality: N/A;  . HERNIA REPAIR    . LIVER TRANSPLANT  [id number]  . multiple skin cancers removed    . POLYPECTOMY  08/09/2017   Procedure: POLYPECTOMY;  Surgeon: Rogene Houston, MD;  [facility];  Service: Endoscopy;;  colon small bowel  . REVERSE SHOULDER ARTHROPLASTY Left 07/17/2018  . REVERSE SHOULDER ARTHROPLASTY Left 07/17/2018   Procedure: LEFT REVERSE SHOULDER ARTHROPLASTY;  Surgeon: Justice Britain, MD;  Location: [place];  Service: Orthopedics;  Laterality: Left;  177min  . SPLENECTOMY  2006  . UPPER GASTROINTESTINAL ENDOSCOPY     Done at [facility]    Family History Family History  Problem Relation Age of Onset  . Prostate cancer Father   . Colon cancer Father   . Colon cancer Sister   . Lung cancer Sister   . Healthy Son   . Alcohol abuse Brother      Social History  reports that she has never smoked. She has never used smokeless tobacco. She reports that she does not drink alcohol or use drugs.  Medications  Current Outpatient  Medications:  .  acetaminophen (TYLENOL) 500 MG tablet, Take 1,000 mg by mouth 2 (two) times daily as needed for moderate pain or headache., Disp: , Rfl:  .  alendronate (FOSAMAX) 70 MG tablet, Take 1 tablet (70 mg total) by mouth every 7 (seven) days. Take with a full glass of water on an empty stomach., Disp: 12 tablet, Rfl: 2 .  ALPRAZolam (XANAX) 0.5 MG tablet, Take 1 tablet (0.5 mg total) by mouth at bedtime as needed. (Patient taking differently: Take 0.5 mg by mouth at bedtime. ), Disp: 30 tablet, Rfl: 5 .  aspirin EC 81 MG tablet, Take 81 mg by mouth daily., Disp: , Rfl:  .  Biotin 10000 MCG TABS, Take 10,000 mcg by mouth daily., Disp: , Rfl:  .  CALCIUM CITRATE PO, Take 1,200 mg by mouth 2 (two) times daily. , Disp: , Rfl:  .  cetirizine (ZYRTEC) 10 MG tablet, Take 0.5 tablets (5 mg total) by mouth at bedtime., Disp: 30 tablet, Rfl: 11 .  cyclobenzaprine (FLEXERIL) 5 MG tablet, Take 1 tablet (5 mg total) by mouth 3 (three) times daily as needed for muscle spasms. (Patient taking differently: Take 5 mg by mouth 2 (two) times daily as needed for muscle spasms. ), Disp: 60 tablet, Rfl: 1 .  everolimus (AFINITOR) 5 MG tablet, Take 12.5 mg by mouth 2 (two) times daily., Disp: , Rfl:  .  ferrous sulfate 325 (65 FE) MG tablet, Take 325 mg by mouth 2 (two) times daily with a meal. , Disp: , Rfl:  .  fluorouracil (EFUDEX) 5 % cream, Apply 1 application topically daily as needed (cancer spots). , Disp: , Rfl: 0 .  fluticasone (FLONASE) 50 MCG/ACT nasal spray, Place 2 sprays into both nostrils daily. (Patient taking differently: Place 2 sprays into both nostrils daily as needed for allergies. ), Disp: 16 g, Rfl: 6 .  HYDROcodone-acetaminophen (NORCO/VICODIN) 5-325 MG tablet, Take 1 tablet by mouth 3 (three) times daily as needed for moderate pain., Disp: , Rfl:  .  hydrocortisone cream 1 %, Apply 1 application topically daily as needed for itching., Disp: , Rfl:  .  hydrOXYzine (ATARAX/VISTARIL) 25 MG  tablet, TAKE 1 TABLET THREE TIMES DAILY AS NEEDED FOR ITCHING, Disp: 60 tablet, Rfl: 1 .  levothyroxine (SYNTHROID) 125 MCG tablet, Take 1 tablet (125 mcg total) by mouth daily., Disp: 90 tablet, Rfl: 1 .  loperamide (IMODIUM) 2 MG capsule, TAKE 2 CAPSULES 4 TIMES DAILY AS NEEDED (Patient taking differently: Take 4 mg by mouth 4 (four) times daily as needed for diarrhea or loose stools. ), Disp: 90 capsule, Rfl: 1 .  loratadine (CLARITIN) 10 MG tablet, Take 1 tablet (10 mg total) by mouth daily., Disp: 90 tablet, Rfl: 3 .  losartan (COZAAR) 25 MG tablet, Take 1 tablet (25 mg total) by mouth daily., Disp: 90 tablet, Rfl: 0 .  magnesium oxide (MAG-OX) 400 MG tablet, Take 400 mg by mouth daily., Disp: , Rfl:  .  metoprolol succinate (TOPROL-XL) 25 MG 24 hr tablet, TAKE 1 TABLET BY MOUTH ONCE DAILY, Disp: 90 tablet, Rfl: 1 .  ondansetron (ZOFRAN) 4 MG tablet, Take 4 mg by mouth every 8 (eight) hours as needed for nausea or vomiting., Disp: , Rfl:  .  oxyCODONE (OXY IR/ROXICODONE) 5 MG immediate release tablet, Take 5 mg by mouth 2 (two) times daily as needed for severe pain., Disp: , Rfl:  .  pantoprazole (PROTONIX) 40 MG tablet, Take 1 tablet (40 mg total) by mouth daily before breakfast., Disp: 90 tablet, Rfl: 2 .  propranolol (INDERAL) 80 MG tablet, Take 80 mg by mouth daily. , Disp: , Rfl:  .  ranitidine (ZANTAC) 150 MG tablet, Take 1 tablet (150 mg total) by mouth 2 (two) times daily., Disp: 60 tablet, Rfl: 6 .  tacrolimus (PROGRAF) 1 MG capsule, Take 2 mg by mouth 2 (two) times daily. , Disp: , Rfl:  .  ursodiol (ACTIGALL) 250 MG tablet, Take 300 mg by mouth 2 (two) times daily. , Disp: , Rfl:  .  vitamin B-12 (CYANOCOBALAMIN) 1000 MCG tablet, Take 1,000 mcg by mouth daily., Disp: , Rfl:  .  Vitamin D, Ergocalciferol, (DRISDOL) 50000 units CAPS capsule, TAKE 1 CAPSULE BY MOUTH ONCE A WEEK, Disp: 12 capsule, Rfl: 0 .  vitamin E (VITAMIN E) 400 UNIT capsule, Take 400 Units by mouth daily., Disp: ,  Rfl:   Allergies Ciprofloxacin and Codeine  Review of Systems Review of Systems - Oncology ROS negative other than a rash.     Physical Exam  Vitals Wt Readings from Last 3 Encounters:  09/02/18 143 lb 8 oz (65.1 kg)  08/21/18 140 lb 4.8 oz (63.6 kg)  08/19/18 140 lb 4.8 oz (63.6 kg)   Temp Readings from Last 3 Encounters:  09/02/18 98.1 F (36.7 C) (Oral)  08/21/18 97.6 F (36.4 C) (Oral)  08/19/18 98 F (36.7 C) (Oral)   BP Readings from Last 3 Encounters:  09/02/18 124/66  08/21/18 113/73  08/19/18 (!) 147/84   Pulse Readings from Last 3 Encounters:  09/02/18 80  08/21/18 79  08/19/18 79   Constitutional: Well-developed, well-nourished, and in no distress.   HENT: Head: Normocephalic and atraumatic.  Mouth/Throat: No oropharyngeal exudate. Mucosa moist. Eyes: Pupils are equal, round, and reactive to light. Conjunctivae are normal. No scleral icterus.  Neck: Normal range of motion. Neck supple. No JVD present.  Cardiovascular: Normal rate, regular rhythm and normal heart sounds.  Exam reveals no gallop and no friction rub.   No murmur heard. Pulmonary/Chest: Effort normal and breath sounds normal. No respiratory distress. No wheezes.No rales.  Abdominal: Soft. Bowel sounds are normal. No distension. There is no tenderness. There is no guarding.  Musculoskeletal: No edema or tenderness.  Lymphadenopathy: No cervical, axillary or supraclavicular adenopathy.  Neurological: Alert and oriented to person, place, and time. No cranial nerve deficit.  Skin: Skin is warm  and dry. No rash noted. No erythema. No pallor.  Psychiatric: Affect and judgment normal.   Labs No visits with results within 3 Day(s) from this visit.  Latest known visit with results is:  Admission on 08/21/2018, Discharged on 08/21/2018  Component Date Value Ref Range Status  . H Pylori IgG 08/21/2018 0.48  0.00 - 0.79 Index Value Final   Comment: (NOTE)                             Negative            <0.80                             Equivocal    0.80 - 0.89                             Positive           >0.89 Performed At: [facility] [place], [place] [id number] Rush Farmer MD RX:[id number]      Pathology Orders Placed This Encounter  Procedures  . CBC with Differential/Platelet    Standing Status:   Future    Standing Expiration Date:   09/03/2019  . Comprehensive metabolic panel    Standing Status:   Future    Standing Expiration Date:   09/03/2019  . Lactate dehydrogenase    Standing Status:   Future    Standing Expiration Date:   09/03/2019  . Ferritin    Standing Status:   Future    Standing Expiration Date:   09/03/2019       Zoila Shutter MD

## 2018-09-03 DIAGNOSIS — C44722 Squamous cell carcinoma of skin of right lower limb, including hip: Secondary | ICD-10-CM | POA: Diagnosis not present

## 2018-09-05 ENCOUNTER — Inpatient Hospital Stay (HOSPITAL_COMMUNITY): Payer: Medicare Other

## 2018-09-05 ENCOUNTER — Ambulatory Visit: Payer: Medicare Other | Admitting: Physical Therapy

## 2018-09-05 ENCOUNTER — Other Ambulatory Visit: Payer: Self-pay

## 2018-09-05 ENCOUNTER — Encounter: Payer: Self-pay | Admitting: Physical Therapy

## 2018-09-05 ENCOUNTER — Encounter (HOSPITAL_COMMUNITY): Payer: Self-pay

## 2018-09-05 VITALS — BP 121/66 | HR 82 | Temp 98.2°F | Resp 18 | Wt 145.6 lb

## 2018-09-05 DIAGNOSIS — Z1509 Genetic susceptibility to other malignant neoplasm: Secondary | ICD-10-CM | POA: Diagnosis not present

## 2018-09-05 DIAGNOSIS — M25612 Stiffness of left shoulder, not elsewhere classified: Secondary | ICD-10-CM

## 2018-09-05 DIAGNOSIS — D509 Iron deficiency anemia, unspecified: Secondary | ICD-10-CM | POA: Diagnosis not present

## 2018-09-05 DIAGNOSIS — D508 Other iron deficiency anemias: Secondary | ICD-10-CM

## 2018-09-05 DIAGNOSIS — R21 Rash and other nonspecific skin eruption: Secondary | ICD-10-CM | POA: Diagnosis not present

## 2018-09-05 DIAGNOSIS — F411 Generalized anxiety disorder: Secondary | ICD-10-CM | POA: Diagnosis not present

## 2018-09-05 DIAGNOSIS — G8929 Other chronic pain: Secondary | ICD-10-CM | POA: Diagnosis not present

## 2018-09-05 DIAGNOSIS — M25512 Pain in left shoulder: Secondary | ICD-10-CM | POA: Diagnosis not present

## 2018-09-05 DIAGNOSIS — E559 Vitamin D deficiency, unspecified: Secondary | ICD-10-CM | POA: Diagnosis not present

## 2018-09-05 DIAGNOSIS — E039 Hypothyroidism, unspecified: Secondary | ICD-10-CM | POA: Diagnosis not present

## 2018-09-05 DIAGNOSIS — I10 Essential (primary) hypertension: Secondary | ICD-10-CM | POA: Diagnosis not present

## 2018-09-05 DIAGNOSIS — M7918 Myalgia, other site: Secondary | ICD-10-CM | POA: Diagnosis not present

## 2018-09-05 DIAGNOSIS — D473 Essential (hemorrhagic) thrombocythemia: Secondary | ICD-10-CM | POA: Diagnosis not present

## 2018-09-05 DIAGNOSIS — Z85038 Personal history of other malignant neoplasm of large intestine: Secondary | ICD-10-CM | POA: Diagnosis not present

## 2018-09-05 DIAGNOSIS — M25559 Pain in unspecified hip: Secondary | ICD-10-CM | POA: Diagnosis not present

## 2018-09-05 DIAGNOSIS — M6281 Muscle weakness (generalized): Secondary | ICD-10-CM | POA: Diagnosis not present

## 2018-09-05 DIAGNOSIS — D638 Anemia in other chronic diseases classified elsewhere: Secondary | ICD-10-CM | POA: Diagnosis not present

## 2018-09-05 DIAGNOSIS — Z79899 Other long term (current) drug therapy: Secondary | ICD-10-CM | POA: Diagnosis not present

## 2018-09-05 MED ORDER — SODIUM CHLORIDE 0.9 % IV SOLN
Freq: Once | INTRAVENOUS | Status: AC
  Administered 2018-09-05: 14:00:00 via INTRAVENOUS

## 2018-09-05 MED ORDER — SODIUM CHLORIDE 0.9 % IV SOLN
750.0000 mg | Freq: Once | INTRAVENOUS | Status: AC
  Administered 2018-09-05: 750 mg via INTRAVENOUS
  Filled 2018-09-05: qty 15

## 2018-09-05 NOTE — Therapy (Signed)
[facility] [place], [place], [address] Phone: [phone]   Fax:  [phone]  Physical Therapy Treatment  Patient Details  Name: Cassidy Barber MRN: [id number] Date of Birth: 12-17-1950 Referring Provider (PT): Jenetta Loges, [place]   Encounter Date: 09/05/2018  PT End of Session - 09/05/18 0915    Visit Number  11    Number of Visits  30   new order 2-3x/week for 4 weeks   Date for PT Re-Evaluation  10/24/18    Authorization Type  FOTO every 5th visit, Progress note every 10th visit    PT Start Time  0900    PT Stop Time  0948    PT Time Calculation (min)  48 min    Activity Tolerance  Patient tolerated treatment well    Behavior During Therapy  [facility] for tasks assessed/performed       Past Medical History:  Diagnosis Date  . Anemia of chronic disease 10/26/2013  . Hernia of unspecified site of abdominal cavity without mention of obstruction or gangrene   . Hypertension   . Liver disease   . Lynch syndrome   . Lynch syndrome   . Primary biliary cirrhosis ([place]) 10/26/2013  . Thyroid disease     Past Surgical History:  Procedure Laterality Date  . ABDOMINAL HERNIA REPAIR     Patient's states that she has had 8- 9 hernia surgeries  . ABDOMINAL HYSTERECTOMY    . CHOLECYSTECTOMY  2007  . COLON RESECTION    . COLON SURGERY  2008   [facility] at [facility]  . COLONOSCOPY     Done at [facility]  . ESOPHAGOGASTRODUODENOSCOPY N/A 08/21/2018   Procedure: ESOPHAGOGASTRODUODENOSCOPY (EGD);  Surgeon: Rogene Houston, MD;  [facility];  Service: Endoscopy;  Laterality: N/A;  . EYE SURGERY     lasix  . FLEXIBLE SIGMOIDOSCOPY N/A 10/20/2015   Procedure: FLEXIBLE SIGMOIDOSCOPY;  Surgeon: Rogene Houston, MD;  [facility];  Service: Endoscopy;  Laterality: N/A;  6 - Dr Laural Golden has meeting until 1:00  . FLEXIBLE SIGMOIDOSCOPY N/A 07/11/2016   Procedure: FLEXIBLE SIGMOIDOSCOPY;  Surgeon: Rogene Houston, MD;  [facility]  [facility];  Service: Endoscopy;  Laterality: N/A;  1200  . FLEXIBLE SIGMOIDOSCOPY N/A 08/09/2017   Procedure: FLEXIBLE SIGMOIDOSCOPY;  Surgeon: Rogene Houston, MD;  [facility];  Service: Endoscopy;  Laterality: N/A;  1:00  . FLEXIBLE SIGMOIDOSCOPY N/A 08/21/2018   Procedure: FLEXIBLE SIGMOIDOSCOPY;  Surgeon: Rogene Houston, MD;  [facility];  Service: Endoscopy;  Laterality: N/A;  . HERNIA REPAIR    . LIVER TRANSPLANT  [id number]  . multiple skin cancers removed    . POLYPECTOMY  08/09/2017   Procedure: POLYPECTOMY;  Surgeon: Rogene Houston, MD;  [facility];  Service: Endoscopy;;  colon small bowel  . REVERSE SHOULDER ARTHROPLASTY Left 07/17/2018  . REVERSE SHOULDER ARTHROPLASTY Left 07/17/2018   Procedure: LEFT REVERSE SHOULDER ARTHROPLASTY;  Surgeon: Justice Britain, MD;  Location: [place];  Service: Orthopedics;  Laterality: Left;  140min  . SPLENECTOMY  2006  . UPPER GASTROINTESTINAL ENDOSCOPY     Done at [facility]    There were no vitals filed for this visit.      [facility] PT Assessment - 09/05/18 0001      Assessment   Medical Diagnosis  left reverse total shoulder replacement    Referring Provider (PT)  Jenetta Loges, PA-C    Onset Date/Surgical Date  07/17/18    Hand Dominance  Right    Next MD Visit  September 05, 2018    Prior Therapy  yes      Precautions   Precautions  Shoulder    Type of Shoulder Precautions  see media for protocol                   [facility] Adult PT Treatment/Exercise - 09/05/18 0001      Exercises   Exercises  Shoulder      Shoulder Exercises: Supine   Protraction  AAROM;Both;20 reps    External Rotation  AAROM;Both;20 reps    Flexion  AAROM;Both;20 reps      Shoulder Exercises: Standing   Other Standing Exercises  wall ladder x5 to level 20      Shoulder Exercises: Pulleys   Flexion  5 minutes      Shoulder Exercises: Isometric Strengthening   Flexion  Supine;5X10"    Extension  Supine;5X10"     External Rotation  Supine;5X10"    Internal Rotation  Supine;5X10"    ABduction  Supine;5X10"      Manual Therapy   Manual Therapy  Passive ROM    Passive ROM  PROM of L shoulder into flex, ER, IR with gentle holds at end range                  PT Long Term Goals - 08/20/18 0953      PT LONG TERM GOAL #1   Title  Patient will be independent with HEP and its progression    Time  6    Period  Weeks    Status  On-going      PT LONG TERM GOAL #2   Title  Patient will demonstrate 130+ degrees of left shoulder flexion AROM to improve ability to perform functional tasks    Time  6    Period  Weeks    Status  On-going      PT LONG TERM GOAL #3   Title  Patient will demonstrate 65+ degrees of left ER AROM to don/doff apparel.    Time  6    Period  Weeks    Status  On-going      PT LONG TERM GOAL #4   Title  Patient will demonstrate 4/5 or greater left shoulder MMT in all planes to improve stability during functional tasks.    Time  6    Period  Weeks    Status  On-going      PT LONG TERM GOAL #5   Title  Patient will report ability to perform all ADLs independently with less than 4/10 left shoulder pain.    Time  6    Period  Weeks    Status  On-going            Plan - 09/05/18 1235    Clinical Impression Statement  Patient was able to tolerate treatment well with minimal reports of pain. Patient noted with improved PROM with minimal pain at end ranges. Patient instructed to continue HEP and exercises with be progressed per protocol next visit. Patient deferred vaso and e-stim this visit.     Clinical Presentation  Stable    Clinical Decision Making  Low    Rehab Potential  Good    PT Frequency  3x / week    PT Duration  6 weeks    PT Treatment/Interventions  ADLs/Self Care Home Management;Cryotherapy;Electrical Stimulation;Ultrasound;Moist Heat;Therapeutic activities;Therapeutic exercise;Patient/family education;Manual techniques;Passive  range of  motion;Dry needling;Vasopneumatic Device;Neuromuscular re-education    PT Next Visit Plan  see protocol, 5 weeks post op 08/21/18; pulleys, isometrics ABD, ER, AAROM flexion PROM flexion to 90-100 degrees, 30 degrees ER in scapular plane, IR at 45 deg in scap plane; modalities PRN    Consulted and Agree with Plan of Care  Patient       Patient will benefit from skilled therapeutic intervention in order to improve the following deficits and impairments:  Decreased activity tolerance, Decreased range of motion, Decreased strength, Pain, Impaired UE functional use  Visit Diagnosis: Stiffness of left shoulder, not elsewhere classified  Muscle weakness (generalized)     Problem List Patient Active Problem List   Diagnosis Date Noted  . S/p reverse total shoulder arthroplasty 07/17/2018  . History of colon cancer 06/12/2018  . History of colonic polyps 06/12/2018  . 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  . Gastroesophageal reflux disease 06/12/2018  . Chronic diarrhea 06/26/2017  . Iron deficiency anemia 06/26/2017  . GAD (generalized anxiety disorder) 08/28/2016  . Vitamin D deficiency 08/28/2016  . GERD (gastroesophageal reflux disease) 08/28/2016  . Insomnia 08/28/2016  . Convulsions ([place]) 11/30/2015  . Liver transplant recipient [facility]) 11/30/2015  . Seizures ([place]) 09/27/2015  . Essential hypertension, benign 03/10/2015  . Hx of liver transplant ([place]) 12/08/2014  . Diarrhea 12/08/2014  . Colon cancer ([place]) 12/08/2014  . Hepatic encephalopathy ([place]) 10/26/2013  . Anemia of chronic disease 10/26/2013  . Personal history of colonic polyps 12/27/2012  . PBC (primary biliary cirrhosis) 03/24/2012  . Hypothyroidism 03/24/2012  . Ventral hernia, recurrent 03/24/2012   Gabriela Eves, PT, DPT 09/05/2018, 12:37 PM  [facility] [address] [place], [place], [address] Phone: [phone]   Fax:  [phone]  Name: Cassidy Barber MRN:  [id number] Date of Birth: 21-Sep-1951

## 2018-09-05 NOTE — Progress Notes (Signed)
Pt ambulated to 407 today for Injectafer today. No complaints of pain. VSS.   Treatment given today per MD orders. Tolerated Injectafer infusion without adverse affects. Vital signs stable. No complaints at this time. Discharged from clinic ambulatory. F/U with [facility] as scheduled.

## 2018-09-05 NOTE — Patient Instructions (Signed)
[facility]  Discharge Instructions:   _______________________________________________________________  Thank you for choosing [facility] to provide your oncology and hematology care.  To afford each patient quality time with our providers, please arrive at least 15 minutes before your scheduled appointment.  You need to re-schedule your appointment if you arrive 10 or more minutes late.  We strive to give you quality time with our providers, and arriving late affects you and other patients whose appointments are after yours.  Also, if you no show three or more times for appointments you may be dismissed from the clinic.  Again, thank you for choosing [facility]. Our hope is that these requests will allow you access to exceptional care and in a timely manner. _______________________________________________________________  If you have questions after your visit, please contact our office at [phone] between the hours of 8:30 a.m. and 5:00 p.m. Voicemails left after 4:30 p.m. will not be returned until the following business day. _______________________________________________________________  For prescription refill requests, have your pharmacy contact our office. _______________________________________________________________  Recommendations made by the consultant and any test results will be sent to your referring physician. _______________________________________________________________ 

## 2018-09-08 ENCOUNTER — Ambulatory Visit (INDEPENDENT_AMBULATORY_CARE_PROVIDER_SITE_OTHER): Payer: Medicare Other | Admitting: Physician Assistant

## 2018-09-08 ENCOUNTER — Encounter: Payer: Self-pay | Admitting: Physician Assistant

## 2018-09-08 ENCOUNTER — Ambulatory Visit: Payer: Medicare Other | Admitting: Physical Therapy

## 2018-09-08 ENCOUNTER — Encounter: Payer: Self-pay | Admitting: Physical Therapy

## 2018-09-08 ENCOUNTER — Other Ambulatory Visit: Payer: Self-pay | Admitting: Family

## 2018-09-08 VITALS — BP 137/80 | HR 68 | Temp 97.2°F | Ht 64.0 in | Wt 143.0 lb

## 2018-09-08 DIAGNOSIS — M25612 Stiffness of left shoulder, not elsewhere classified: Secondary | ICD-10-CM | POA: Diagnosis not present

## 2018-09-08 DIAGNOSIS — L509 Urticaria, unspecified: Secondary | ICD-10-CM | POA: Diagnosis not present

## 2018-09-08 DIAGNOSIS — G8929 Other chronic pain: Secondary | ICD-10-CM | POA: Diagnosis not present

## 2018-09-08 DIAGNOSIS — M25512 Pain in left shoulder: Secondary | ICD-10-CM

## 2018-09-08 DIAGNOSIS — M6281 Muscle weakness (generalized): Secondary | ICD-10-CM | POA: Diagnosis not present

## 2018-09-08 MED ORDER — PREDNISONE 10 MG (21) PO TBPK: ORAL_TABLET | ORAL | 0 refills | Status: DC

## 2018-09-08 MED ORDER — HYDROXYZINE HCL 25 MG PO TABS: ORAL_TABLET | ORAL | 5 refills | Status: DC

## 2018-09-08 MED ORDER — METHYLPREDNISOLONE ACETATE 80 MG/ML IJ SUSP
80.0000 mg | Freq: Once | INTRAMUSCULAR | Status: AC
  Administered 2018-09-08: 80 mg via INTRAMUSCULAR

## 2018-09-08 NOTE — Therapy (Addendum)
[facility] [place], [place], [address] Phone: [phone]   Fax:  [phone]  Physical Therapy Treatment  Patient Details  Name: Cassidy Barber MRN: [id number] Date of Birth: [birth year] Referring Provider (PT): Jenetta Loges, [place]   Encounter Date: 09/08/2018  PT End of Session - 09/08/18 0904    Visit Number  12    Number of Visits  30    Date for PT Re-Evaluation  10/24/18    Authorization Type  FOTO every 5th visit, Progress note every 10th visit    PT Start Time  0900    PT Stop Time  0957    PT Time Calculation (min)  57 min    Activity Tolerance  Patient tolerated treatment well    Behavior During Therapy  [facility] for tasks assessed/performed       Past Medical History:  Diagnosis Date  . Anemia of chronic disease 10/26/2013  . Hernia of unspecified site of abdominal cavity without mention of obstruction or gangrene   . Hypertension   . Liver disease   . Lynch syndrome   . Lynch syndrome   . Primary biliary cirrhosis ([place]) 10/26/2013  . Thyroid disease     Past Surgical History:  Procedure Laterality Date  . ABDOMINAL HERNIA REPAIR     Patient's states that she has had 8- 9 hernia surgeries  . ABDOMINAL HYSTERECTOMY    . CHOLECYSTECTOMY  2007  . COLON RESECTION    . COLON SURGERY  2008   Done at [facility]  . COLONOSCOPY     Done at [facility]  . ESOPHAGOGASTRODUODENOSCOPY N/A 08/21/2018   Procedure: ESOPHAGOGASTRODUODENOSCOPY (EGD);  Surgeon: Rogene Houston, MD;  [facility];  Service: Endoscopy;  Laterality: N/A;  . EYE SURGERY     lasix  . FLEXIBLE SIGMOIDOSCOPY N/A 10/20/2015   Procedure: FLEXIBLE SIGMOIDOSCOPY;  Surgeon: Rogene Houston, MD;  [facility];  Service: Endoscopy;  Laterality: N/A;  69 - Dr Laural Golden has meeting until 1:00  . FLEXIBLE SIGMOIDOSCOPY N/A 07/11/2016   Procedure: FLEXIBLE SIGMOIDOSCOPY;  Surgeon: Rogene Houston, MD;  [facility];  Service: Endoscopy;   Laterality: N/A;  1200  . FLEXIBLE SIGMOIDOSCOPY N/A 08/09/2017   Procedure: FLEXIBLE SIGMOIDOSCOPY;  Surgeon: Rogene Houston, MD;  [facility];  Service: Endoscopy;  Laterality: N/A;  1:00  . FLEXIBLE SIGMOIDOSCOPY N/A 08/21/2018   Procedure: FLEXIBLE SIGMOIDOSCOPY;  Surgeon: Rogene Houston, MD;  [facility];  Service: Endoscopy;  Laterality: N/A;  . HERNIA REPAIR    . LIVER TRANSPLANT  [id number]  . multiple skin cancers removed    . POLYPECTOMY  08/09/2017   Procedure: POLYPECTOMY;  Surgeon: Rogene Houston, MD;  [facility];  Service: Endoscopy;;  colon small bowel  . REVERSE SHOULDER ARTHROPLASTY Left 07/17/2018  . REVERSE SHOULDER ARTHROPLASTY Left 07/17/2018   Procedure: LEFT REVERSE SHOULDER ARTHROPLASTY;  Surgeon: Justice Britain, MD;  Location: [place];  Service: Orthopedics;  Laterality: Left;  155min  . SPLENECTOMY  2006  . UPPER GASTROINTESTINAL ENDOSCOPY     Done at [facility]    There were no vitals filed for this visit.  Subjective Assessment - 09/08/18 0902    Subjective  Patient reported she was able to tie her hair up independently by propping her elbow up on the wall.     Pertinent History  L reverse total shoulder replacement 07/17/18, OA    Limitations  Lifting;House  hold activities    Patient Stated Goals  use arm again and return to gardening    Currently in Pain?  No/denies         [facility] PT Assessment - 09/08/18 0001      Assessment   Medical Diagnosis  left reverse total shoulder replacement    Referring Provider (PT)  Jenetta Loges, PA-C    Onset Date/Surgical Date  07/17/18    Hand Dominance  Right    Next MD Visit  December     Prior Therapy  yes      Precautions   Precautions  Shoulder    Type of Shoulder Precautions  see media for protocol                   [facility] Adult PT Treatment/Exercise - 09/08/18 0001      Exercises   Exercises  Shoulder      Shoulder Exercises: Supine   Flexion  AROM;Left;20  reps      Shoulder Exercises: Sidelying   External Rotation  AROM;Left;20 reps    External Rotation Limitations  attempted but could not perform without compensation    Flexion  --      Shoulder Exercises: Standing   Other Standing Exercises  wall ladder x5 to level 24-26      Shoulder Exercises: Pulleys   Flexion  5 minutes      Shoulder Exercises: ROM/Strengthening   Wall Wash  flexion circles cw/ccw x2 minutes each      Shoulder Exercises: Isometric Strengthening   Flexion  Supine;5X10"    Extension  Supine;5X10"    External Rotation  Supine;5X10"    Internal Rotation  Supine;5X10"    ABduction  Supine;5X10"      Shoulder Exercises: Stretch   Corner Stretch  3 reps;20 seconds      Modalities   Modalities  Electrical Stimulation;Vasopneumatic      Electrical Stimulation   Electrical Stimulation Location  left shoulder    Electrical Stimulation Action  IFC    Electrical Stimulation Parameters  80-150 hz x15     Electrical Stimulation Goals  Pain      Vasopneumatic   Number Minutes Vasopneumatic   15 minutes    Vasopnuematic Location   Shoulder    Vasopneumatic Pressure  Low    Vasopneumatic Temperature   36      Manual Therapy   Manual Therapy  Passive ROM    Passive ROM  PROM of L shoulder into flex, ER, IR with gentle holds at end range             PT Education - 09/08/18 1049    Education Details  wall washes (flexion, circles) and isometrics (flexion extension ER, IR)    Person(s) Educated  Patient    Methods  Demonstration;Handout;Explanation    Comprehension  Verbalized understanding;Returned demonstration          PT Long Term Goals - 08/20/18 0953      PT LONG TERM GOAL #1   Title  Patient will be independent with HEP and its progression    Time  6    Period  Weeks    Status  On-going      PT LONG TERM GOAL #2   Title  Patient will demonstrate 130+ degrees of left shoulder flexion AROM to improve ability to perform functional tasks     Time  6    Period  Weeks    Status  On-going  PT LONG TERM GOAL #3   Title  Patient will demonstrate 65+ degrees of left ER AROM to don/doff apparel.    Time  6    Period  Weeks    Status  On-going      PT LONG TERM GOAL #4   Title  Patient will demonstrate 4/5 or greater left shoulder MMT in all planes to improve stability during functional tasks.    Time  6    Period  Weeks    Status  On-going      PT LONG TERM GOAL #5   Title  Patient will report ability to perform all ADLs independently with less than 4/10 left shoulder pain.    Time  6    Period  Weeks    Status  On-going            Plan - 09/08/18 7048    Clinical Impression Statement  Patient was able to tolerate progression of treatment well despite reports of muscle fatigue. Patient instructed to rest and return to activity with improved proper from rather than compensate for the motion. Patient reported undertstanding. Patient provided with HEP consisiting of isometrics and wall washes. Patient reported understanding. Normal response ot modalities at removal.    Clinical Presentation  Stable    Clinical Decision Making  Low    Rehab Potential  Good    PT Frequency  3x / week    PT Duration  6 weeks    PT Treatment/Interventions  ADLs/Self Care Home Management;Cryotherapy;Electrical Stimulation;Ultrasound;Moist Heat;Therapeutic activities;Therapeutic exercise;Patient/family education;Manual techniques;Passive range of motion;Dry needling;Vasopneumatic Device;Neuromuscular re-education    PT Next Visit Plan  see protocol, 8 weeks post op 09/11/18; pulleys, isometrics ABD, ER, AAROM flexion PROM flexion to 90-100 degrees, 30 degrees ER in scapular plane, IR at 45 deg in scap plane; modalities PRN    Consulted and Agree with Plan of Care  Patient       Patient will benefit from skilled therapeutic intervention in order to improve the following deficits and impairments:  Decreased activity tolerance, Decreased range  of motion, Decreased strength, Pain, Impaired UE functional use  Visit Diagnosis: Stiffness of left shoulder, not elsewhere classified  Muscle weakness (generalized)  Acute pain of left shoulder     Problem List Patient Active Problem List   Diagnosis Date Noted  . Urticaria 09/08/2018  . S/p reverse total shoulder arthroplasty 07/17/2018  . History of colon cancer 06/12/2018  . History of colonic polyps 06/12/2018  . 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  . Gastroesophageal reflux disease 06/12/2018  . Chronic diarrhea 06/26/2017  . Iron deficiency anemia 06/26/2017  . GAD (generalized anxiety disorder) 08/28/2016  . Vitamin D deficiency 08/28/2016  . GERD (gastroesophageal reflux disease) 08/28/2016  . Insomnia 08/28/2016  . Convulsions ([place]) 11/30/2015  . Liver transplant recipient [facility]) 11/30/2015  . Seizures ([place]) 09/27/2015  . Essential hypertension, benign 03/10/2015  . Hx of liver transplant ([place]) 12/08/2014  . Diarrhea 12/08/2014  . Colon cancer ([place]) 12/08/2014  . Hepatic encephalopathy ([place]) 10/26/2013  . Anemia of chronic disease 10/26/2013  . Personal history of colonic polyps 12/27/2012  . PBC (primary biliary cirrhosis) 03/24/2012  . Hypothyroidism 03/24/2012  . Ventral hernia, recurrent 03/24/2012   Gabriela Eves, PT, DPT 09/08/2018, 10:50 AM  [facility] [address] [place], [place], [address] Phone: [phone]   Fax:  [phone]  Name: Cassidy Barber MRN: [id number] Date of Birth: 1951-03-08

## 2018-09-09 NOTE — Progress Notes (Signed)
BP 137/80   Pulse 68   Temp (!) 97.2 F (36.2 C) (Oral)   Ht 5\' 4"  (1.626 m)   Wt 143 lb (64.9 kg)   BMI 24.55 kg/m    Subjective:    Patient ID: Cassidy Barber, female    DOB: July 01, 1951, 67 y.o.   MRN: [id number]  HPI: Cassidy Barber is a 67 y.o. female presenting on 09/08/2018 for Urticaria  She has had several days of red hives on her body. There is no new exposure and unaware of any product changes.  She has tried OTC antihistamine without any relief. The itching is quite severe.  Past Medical History:  Diagnosis Date  . Anemia of chronic disease 10/26/2013  . Hernia of unspecified site of abdominal cavity without mention of obstruction or gangrene   . Hypertension   . Liver disease   . Lynch syndrome   . Lynch syndrome   . Primary biliary cirrhosis ([place]) 10/26/2013  . Thyroid disease    Relevant past medical, surgical, family and social history reviewed and updated as indicated. Interim medical history since our last visit reviewed. Allergies and medications reviewed and updated. DATA REVIEWED: CHART IN EPIC  Family History reviewed for pertinent findings.  Review of Systems  Constitutional: Negative.   HENT: Negative.   Eyes: Negative.   Respiratory: Negative.   Gastrointestinal: Negative.   Genitourinary: Negative.   Skin: Positive for color change and rash.    Allergies as of 09/08/2018      Reactions   Ciprofloxacin Itching   Codeine Nausea Only      Medication List        Accurate as of 09/08/18 11:59 PM. Always use your most recent med list.          acetaminophen 500 MG tablet Commonly known as:  TYLENOL Take 1,000 mg by mouth 2 (two) times daily as needed for moderate pain or headache.   alendronate 70 MG tablet Commonly known as:  FOSAMAX TAKE 1 TABLET BY MOUTH ONCE A WEEK WITH A FULL GLASS OF WATER ON AN EMPTY STOMACH   ALPRAZolam 0.5 MG tablet Commonly known as:  XANAX Take 1 tablet (0.5 mg total) by mouth at bedtime as needed.     aspirin EC 81 MG tablet Take 81 mg by mouth daily.   Biotin 10000 MCG Tabs Take 10,000 mcg by mouth daily.   CALCIUM CITRATE PO Take 1,200 mg by mouth 2 (two) times daily.   cetirizine 10 MG tablet Commonly known as:  ZYRTEC Take 0.5 tablets (5 mg total) by mouth at bedtime.   cyclobenzaprine 5 MG tablet Commonly known as:  FLEXERIL Take 1 tablet (5 mg total) by mouth 3 (three) times daily as needed for muscle spasms.   everolimus 5 MG tablet Commonly known as:  AFINITOR Take 12.5 mg by mouth 2 (two) times daily.   ferrous sulfate 325 (65 FE) MG tablet Take 325 mg by mouth 2 (two) times daily with a meal.   fluorouracil 5 % cream Commonly known as:  EFUDEX Apply 1 application topically daily as needed (cancer spots).   fluticasone 50 MCG/ACT nasal spray Commonly known as:  FLONASE Place 2 sprays into both nostrils daily.   HYDROcodone-acetaminophen 5-325 MG tablet Commonly known as:  NORCO/VICODIN Take 1 tablet by mouth 3 (three) times daily as needed for moderate pain.   hydrocortisone cream 1 % Apply 1 application topically daily as needed for itching.   hydrOXYzine 25 MG tablet  Commonly known as:  ATARAX/VISTARIL TAKE 1 TABLET THREE TIMES DAILY AS NEEDED FOR ITCHING   levothyroxine 125 MCG tablet Commonly known as:  SYNTHROID, LEVOTHROID Take 1 tablet (125 mcg total) by mouth daily.   loperamide 2 MG capsule Commonly known as:  IMODIUM TAKE 2 CAPSULES 4 TIMES DAILY AS NEEDED   loratadine 10 MG tablet Commonly known as:  CLARITIN Take 1 tablet (10 mg total) by mouth daily.   losartan 25 MG tablet Commonly known as:  COZAAR Take 1 tablet (25 mg total) by mouth daily.   magnesium oxide 400 MG tablet Commonly known as:  MAG-OX Take 400 mg by mouth daily.   metoprolol succinate 25 MG 24 hr tablet Commonly known as:  TOPROL-XL TAKE 1 TABLET BY MOUTH ONCE DAILY   ondansetron 4 MG tablet Commonly known as:  ZOFRAN Take 4 mg by mouth every 8 (eight)  hours as needed for nausea or vomiting.   pantoprazole 40 MG tablet Commonly known as:  PROTONIX Take 1 tablet (40 mg total) by mouth daily before breakfast.   predniSONE 10 MG (21) Tbpk tablet Commonly known as:  STERAPRED UNI-PAK 21 TAB As directed x 6 days   propranolol 80 MG tablet Commonly known as:  INDERAL Take 80 mg by mouth daily.   ranitidine 150 MG tablet Commonly known as:  ZANTAC Take 1 tablet (150 mg total) by mouth 2 (two) times daily.   ursodiol 250 MG tablet Commonly known as:  ACTIGALL Take 300 mg by mouth 2 (two) times daily.   vitamin B-12 1000 MCG tablet Commonly known as:  CYANOCOBALAMIN Take 1,000 mcg by mouth daily.   Vitamin D (Ergocalciferol) 50000 units Caps capsule Commonly known as:  DRISDOL TAKE 1 CAPSULE BY MOUTH ONCE A WEEK   vitamin E 400 UNIT capsule Generic drug:  vitamin E Take 400 Units by mouth daily.          Objective:    BP 137/80   Pulse 68   Temp (!) 97.2 F (36.2 C) (Oral)   Ht 5\' 4"  (1.626 m)   Wt 143 lb (64.9 kg)   BMI 24.55 kg/m   Allergies  Allergen Reactions  . Ciprofloxacin Itching  . Codeine Nausea Only    Wt Readings from Last 3 Encounters:  09/08/18 143 lb (64.9 kg)  09/05/18 145 lb 9.6 oz (66 kg)  09/02/18 143 lb 8 oz (65.1 kg)    Physical Exam  Constitutional: She is oriented to person, place, and time. She appears well-developed and well-nourished.  HENT:  Head: Normocephalic and atraumatic.  Eyes: Pupils are equal, round, and reactive to light. Conjunctivae and EOM are normal.  Cardiovascular: Normal rate, regular rhythm, normal heart sounds and intact distal pulses.  Pulmonary/Chest: Effort normal and breath sounds normal.  Abdominal: Soft. Bowel sounds are normal.  Neurological: She is alert and oriented to person, place, and time. She has normal reflexes.  Skin: Skin is warm and dry. Rash noted. Rash is urticarial. There is erythema.  Psychiatric: She has a normal mood and affect. Her  behavior is normal. Judgment and thought content normal.        Assessment & Plan:   1. Urticaria - methylPREDNISolone acetate (DEPO-MEDROL) injection 80 mg - hydrOXYzine (ATARAX/VISTARIL) 25 MG tablet; TAKE 1 TABLET THREE TIMES DAILY AS NEEDED FOR ITCHING  Dispense: 60 tablet; Refill: 5 - predniSONE (STERAPRED UNI-PAK 21 TAB) 10 MG (21) TBPK tablet; As directed x 6 days  Dispense: 21 tablet; Refill: 0  Continue all other maintenance medications as listed above.  Follow up plan: No follow-ups on file.  Educational handout given for [place] PA-C [place] [address]  [place], [place] [address] [phone]   09/09/2018, 9:13 PM

## 2018-09-15 ENCOUNTER — Other Ambulatory Visit: Payer: Self-pay | Admitting: Family

## 2018-09-15 ENCOUNTER — Encounter: Payer: Self-pay | Admitting: Physical Therapy

## 2018-09-15 ENCOUNTER — Other Ambulatory Visit: Payer: Medicare Other

## 2018-09-15 ENCOUNTER — Ambulatory Visit: Payer: Medicare Other | Attending: Orthopedic Surgery | Admitting: Physical Therapy

## 2018-09-15 DIAGNOSIS — M25512 Pain in left shoulder: Secondary | ICD-10-CM

## 2018-09-15 DIAGNOSIS — M25612 Stiffness of left shoulder, not elsewhere classified: Secondary | ICD-10-CM | POA: Insufficient documentation

## 2018-09-15 DIAGNOSIS — G8929 Other chronic pain: Secondary | ICD-10-CM | POA: Insufficient documentation

## 2018-09-15 DIAGNOSIS — M6281 Muscle weakness (generalized): Secondary | ICD-10-CM

## 2018-09-15 DIAGNOSIS — Z298 Encounter for other specified prophylactic measures: Secondary | ICD-10-CM

## 2018-09-15 MED ORDER — VITAMIN D (ERGOCALCIFEROL) 1.25 MG (50000 UNIT) PO CAPS: 50000.0000 [IU] | ORAL_CAPSULE | ORAL | 2 refills | Status: DC

## 2018-09-15 NOTE — Therapy (Signed)
[facility] [place], [place], [address] Phone: [phone]   Fax:  [phone]  Physical Therapy Treatment  Patient Details  Name: Cassidy Barber MRN: [id number] Date of Birth: 04/13/1951 Referring Provider (PT): Jenetta Loges, [place]   Encounter Date: 09/15/2018  PT End of Session - 09/15/18 0952    Visit Number  13    Number of Visits  30    Date for PT Re-Evaluation  10/24/18    Authorization Type  FOTO every 5th visit, Progress note every 10th visit    PT Start Time  0902    PT Stop Time  0954    PT Time Calculation (min)  52 min    Activity Tolerance  Patient tolerated treatment well    Behavior During Therapy  [facility] for tasks assessed/performed       Past Medical History:  Diagnosis Date  . Anemia of chronic disease 10/26/2013  . Hernia of unspecified site of abdominal cavity without mention of obstruction or gangrene   . Hypertension   . Liver disease   . Lynch syndrome   . Lynch syndrome   . Primary biliary cirrhosis ([place]) 10/26/2013  . Thyroid disease     Past Surgical History:  Procedure Laterality Date  . ABDOMINAL HERNIA REPAIR     Patient's states that she has had 8- 9 hernia surgeries  . ABDOMINAL HYSTERECTOMY    . CHOLECYSTECTOMY  2007  . COLON RESECTION    . COLON SURGERY  2008   Done at [facility]  . COLONOSCOPY     Done at [facility]  . ESOPHAGOGASTRODUODENOSCOPY N/A 08/21/2018   Procedure: ESOPHAGOGASTRODUODENOSCOPY (EGD);  Surgeon: Rogene Houston, MD;  [facility];  Service: Endoscopy;  Laterality: N/A;  . EYE SURGERY     lasix  . FLEXIBLE SIGMOIDOSCOPY N/A 10/20/2015   Procedure: FLEXIBLE SIGMOIDOSCOPY;  Surgeon: Rogene Houston, MD;  [facility];  Service: Endoscopy;  Laterality: N/A;  49 - Dr Laural Golden has meeting until 1:00  . FLEXIBLE SIGMOIDOSCOPY N/A 07/11/2016   Procedure: FLEXIBLE SIGMOIDOSCOPY;  Surgeon: Rogene Houston, MD;  [facility];  Service: Endoscopy;   Laterality: N/A;  1200  . FLEXIBLE SIGMOIDOSCOPY N/A 08/09/2017   Procedure: FLEXIBLE SIGMOIDOSCOPY;  Surgeon: Rogene Houston, MD;  [facility];  Service: Endoscopy;  Laterality: N/A;  1:00  . FLEXIBLE SIGMOIDOSCOPY N/A 08/21/2018   Procedure: FLEXIBLE SIGMOIDOSCOPY;  Surgeon: Rogene Houston, MD;  [facility];  Service: Endoscopy;  Laterality: N/A;  . HERNIA REPAIR    . LIVER TRANSPLANT  [id number]  . multiple skin cancers removed    . POLYPECTOMY  08/09/2017   Procedure: POLYPECTOMY;  Surgeon: Rogene Houston, MD;  [facility];  Service: Endoscopy;;  colon small bowel  . REVERSE SHOULDER ARTHROPLASTY Left 07/17/2018  . REVERSE SHOULDER ARTHROPLASTY Left 07/17/2018   Procedure: LEFT REVERSE SHOULDER ARTHROPLASTY;  Surgeon: Justice Britain, MD;  Location: [place];  Service: Orthopedics;  Laterality: Left;  152min  . SPLENECTOMY  2006  . UPPER GASTROINTESTINAL ENDOSCOPY     Done at [facility]    There were no vitals filed for this visit.  Subjective Assessment - 09/15/18 0951    Subjective  Reports that her shoulder is still weak but able to get her hair in a ponytail. Reports only stiffness but no pain.    Pertinent History  L reverse total shoulder replacement 07/17/18, OA    Limitations  Lifting;House hold activities    Patient Stated Goals  use arm again and return to gardening    Currently in Pain?  No/denies         [facility] PT Assessment - 09/15/18 0001      Assessment   Medical Diagnosis  left reverse total shoulder replacement    Referring Provider (PT)  Jenetta Loges, PA-C    Onset Date/Surgical Date  07/17/18    Hand Dominance  Right    Next MD Visit  December     Prior Therapy  yes      Precautions   Precautions  Shoulder    Type of Shoulder Precautions  see media for protocol                   [facility] Adult PT Treatment/Exercise - 09/15/18 0001      Exercises   Exercises  Shoulder      Shoulder Exercises: Supine   Flexion   AROM;Left;20 reps      Shoulder Exercises: Sidelying   External Rotation  AROM;Left;20 reps    Flexion  AROM;Left;20 reps      Shoulder Exercises: Standing   Other Standing Exercises  Wall ladder x10 reps    Other Standing Exercises  UE ranger into flexion x20 reps      Shoulder Exercises: Pulleys   Flexion  5 minutes      Shoulder Exercises: ROM/Strengthening   Wall Wash  wall wash 2x15 reps with elbow flexion      Shoulder Exercises: Isometric Strengthening   Flexion  5X5"    Extension  5X5"    External Rotation  5X5"    Internal Rotation  5X5"      Modalities   Modalities  Vasopneumatic      Vasopneumatic   Number Minutes Vasopneumatic   15 minutes    Vasopnuematic Location   Shoulder    Vasopneumatic Pressure  Low    Vasopneumatic Temperature   34      Manual Therapy   Manual Therapy  Passive ROM    Passive ROM  PROM of L shoulder into flex, ER, IR with gentle holds at end range                  PT Long Term Goals - 09/15/18 9983      PT LONG TERM GOAL #1   Title  Patient will be independent with HEP and its progression    Time  6    Period  Weeks    Status  Achieved      PT LONG TERM GOAL #2   Title  Patient will demonstrate 130+ degrees of left shoulder flexion AROM to improve ability to perform functional tasks    Time  6    Period  Weeks    Status  On-going      PT LONG TERM GOAL #3   Title  Patient will demonstrate 65+ degrees of left ER AROM to don/doff apparel.    Time  6    Period  Weeks    Status  On-going      PT LONG TERM GOAL #4   Title  Patient will demonstrate 4/5 or greater left shoulder MMT in all planes to improve stability during functional tasks.    Time  6    Period  Weeks    Status  On-going      PT LONG TERM GOAL #5   Title  Patient will report ability  to perform all ADLs independently with less than 4/10 left shoulder pain.    Time  6    Period  Weeks    Status  On-going            Plan - 09/15/18 1155     Clinical Impression Statement  Patient presented in clinic with no complaints of pain only of stiffness. Patient's L shoulder still very weak and limited with antigravity exercises secondary to the weakness. Patient able to complete wall wash with elbow flexion with hand at approximately shoulder height. Patient able to complete supine shoulder flexion in IR as opposed to neutral position secondary to weakness which appears greatly in bicep. Much better technique observed with SL ER with tactile and verbal cues. Firm end feels and smooth arc of motion noted with PROM of L shoulder. Normal vasopneumatic response noted following removal of the modality.    Rehab Potential  Good    PT Frequency  3x / week    PT Duration  6 weeks    PT Treatment/Interventions  ADLs/Self Care Home Management;Cryotherapy;Electrical Stimulation;Ultrasound;Moist Heat;Therapeutic activities;Therapeutic exercise;Patient/family education;Manual techniques;Passive range of motion;Dry needling;Vasopneumatic Device;Neuromuscular re-education    PT Next Visit Plan  see protocol, 8 weeks post op 09/11/18; pulleys, isometrics ABD, ER, AAROM flexion PROM flexion to 90-100 degrees, 30 degrees ER in scapular plane, IR at 45 deg in scap plane; modalities PRN    PT Home Exercise Plan  see patient education section    Consulted and Agree with Plan of Care  Patient       Patient will benefit from skilled therapeutic intervention in order to improve the following deficits and impairments:  Decreased activity tolerance, Decreased range of motion, Decreased strength, Pain, Impaired UE functional use  Visit Diagnosis: Stiffness of left shoulder, not elsewhere classified  Muscle weakness (generalized)  Acute pain of left shoulder     Problem List Patient Active Problem List   Diagnosis Date Noted  . Urticaria 09/08/2018  . S/p reverse total shoulder arthroplasty 07/17/2018  . History of colon cancer 06/12/2018  . History of  colonic polyps 06/12/2018  . 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  . Gastroesophageal reflux disease 06/12/2018  . Chronic diarrhea 06/26/2017  . Iron deficiency anemia 06/26/2017  . GAD (generalized anxiety disorder) 08/28/2016  . Vitamin D deficiency 08/28/2016  . GERD (gastroesophageal reflux disease) 08/28/2016  . Insomnia 08/28/2016  . Convulsions ([place]) 11/30/2015  . Liver transplant recipient [facility]) 11/30/2015  . Seizures ([place]) 09/27/2015  . Essential hypertension, benign 03/10/2015  . Hx of liver transplant ([place]) 12/08/2014  . Diarrhea 12/08/2014  . Colon cancer ([place]) 12/08/2014  . Hepatic encephalopathy ([place]) 10/26/2013  . Anemia of chronic disease 10/26/2013  . Personal history of colonic polyps 12/27/2012  . PBC (primary biliary cirrhosis) 03/24/2012  . Hypothyroidism 03/24/2012  . Ventral hernia, recurrent 03/24/2012    Standley Brooking, PTA 09/15/2018, 12:03 PM  [facility] [address] [place], [place], [address] Phone: [phone]   Fax:  [phone]  Name: PEONY BARNER MRN: [id number] Date of Birth: 02-02-1951

## 2018-09-16 ENCOUNTER — Encounter (HOSPITAL_COMMUNITY): Payer: Self-pay

## 2018-09-16 ENCOUNTER — Other Ambulatory Visit: Payer: Self-pay | Admitting: Physician Assistant

## 2018-09-16 ENCOUNTER — Other Ambulatory Visit: Payer: Self-pay

## 2018-09-16 ENCOUNTER — Inpatient Hospital Stay (HOSPITAL_COMMUNITY): Payer: Medicare Other | Attending: Internal Medicine

## 2018-09-16 VITALS — BP 136/67 | HR 63 | Temp 97.5°F | Resp 18

## 2018-09-16 DIAGNOSIS — C44329 Squamous cell carcinoma of skin of other parts of face: Secondary | ICD-10-CM | POA: Diagnosis not present

## 2018-09-16 DIAGNOSIS — C44629 Squamous cell carcinoma of skin of left upper limb, including shoulder: Secondary | ICD-10-CM | POA: Diagnosis not present

## 2018-09-16 DIAGNOSIS — D509 Iron deficiency anemia, unspecified: Secondary | ICD-10-CM | POA: Insufficient documentation

## 2018-09-16 DIAGNOSIS — C44529 Squamous cell carcinoma of skin of other part of trunk: Secondary | ICD-10-CM | POA: Diagnosis not present

## 2018-09-16 DIAGNOSIS — D0471 Carcinoma in situ of skin of right lower limb, including hip: Secondary | ICD-10-CM | POA: Diagnosis not present

## 2018-09-16 DIAGNOSIS — D508 Other iron deficiency anemias: Secondary | ICD-10-CM

## 2018-09-16 MED ORDER — SODIUM CHLORIDE 0.9 % IV SOLN
750.0000 mg | Freq: Once | INTRAVENOUS | Status: AC
  Administered 2018-09-16: 750 mg via INTRAVENOUS
  Filled 2018-09-16: qty 15

## 2018-09-16 MED ORDER — SODIUM CHLORIDE 0.9 % IV SOLN
Freq: Once | INTRAVENOUS | Status: AC
  Administered 2018-09-16: 14:00:00 via INTRAVENOUS

## 2018-09-16 NOTE — Progress Notes (Signed)
Tolerated infusion w/o adverse reaction.  Alert, in no distress.  VSS.  Discharged ambulatory.  

## 2018-09-17 ENCOUNTER — Encounter: Payer: Medicare Other | Admitting: Physical Therapy

## 2018-09-17 DIAGNOSIS — Z8669 Personal history of other diseases of the nervous system and sense organs: Secondary | ICD-10-CM | POA: Diagnosis not present

## 2018-09-17 DIAGNOSIS — Z1509 Genetic susceptibility to other malignant neoplasm: Secondary | ICD-10-CM | POA: Diagnosis not present

## 2018-09-17 DIAGNOSIS — K831 Obstruction of bile duct: Secondary | ICD-10-CM | POA: Diagnosis not present

## 2018-09-17 DIAGNOSIS — D899 Disorder involving the immune mechanism, unspecified: Secondary | ICD-10-CM | POA: Diagnosis not present

## 2018-09-17 DIAGNOSIS — E039 Hypothyroidism, unspecified: Secondary | ICD-10-CM | POA: Diagnosis not present

## 2018-09-17 DIAGNOSIS — Z4823 Encounter for aftercare following liver transplant: Secondary | ICD-10-CM | POA: Diagnosis not present

## 2018-09-17 DIAGNOSIS — I10 Essential (primary) hypertension: Secondary | ICD-10-CM | POA: Diagnosis not present

## 2018-09-17 LAB — CBC WITH DIFFERENTIAL/PLATELET
Basophils Absolute: 0.1 10*3/uL (ref 0.0–0.2)
Basos: 2 %
EOS (ABSOLUTE): 0.2 10*3/uL (ref 0.0–0.4)
EOS: 3 %
Hematocrit: 33.1 % — ABNORMAL LOW (ref 34.0–46.6)
Hemoglobin: 10.3 g/dL — ABNORMAL LOW (ref 11.1–15.9)
Immature Grans (Abs): 0 10*3/uL (ref 0.0–0.1)
Immature Granulocytes: 0 %
LYMPHS ABS: 2.2 10*3/uL (ref 0.7–3.1)
Lymphs: 39 %
MCH: 25.9 pg — ABNORMAL LOW (ref 26.6–33.0)
MCHC: 31.1 g/dL — AB (ref 31.5–35.7)
MCV: 83 fL (ref 79–97)
MONOS ABS: 0.9 10*3/uL (ref 0.1–0.9)
Monocytes: 16 %
NEUTROS PCT: 40 %
Neutrophils Absolute: 2.2 10*3/uL (ref 1.4–7.0)
PLATELETS: 393 10*3/uL (ref 150–450)
RBC: 3.97 x10E6/uL (ref 3.77–5.28)
RDW: 16.1 % — AB (ref 12.3–15.4)
WBC: 5.6 10*3/uL (ref 3.4–10.8)

## 2018-09-17 LAB — CMP14+EGFR
A/G RATIO: 1.4 (ref 1.2–2.2)
ALBUMIN: 3.9 g/dL (ref 3.6–4.8)
ALT: 13 IU/L (ref 0–32)
AST: 19 IU/L (ref 0–40)
Alkaline Phosphatase: 169 IU/L — ABNORMAL HIGH (ref 39–117)
BUN / CREAT RATIO: 16 (ref 12–28)
BUN: 13 mg/dL (ref 8–27)
CHLORIDE: 101 mmol/L (ref 96–106)
CO2: 25 mmol/L (ref 20–29)
CREATININE: 0.79 mg/dL (ref 0.57–1.00)
Calcium: 8.8 mg/dL (ref 8.7–10.3)
GFR calc Af Amer: 90 mL/min/{1.73_m2} (ref >59)
GFR calc non Af Amer: 78 mL/min/{1.73_m2} (ref >59)
Globulin, Total: 2.7 g/dL (ref 1.5–4.5)
Glucose: 89 mg/dL (ref 65–99)
POTASSIUM: 4.2 mmol/L (ref 3.5–5.2)
SODIUM: 139 mmol/L (ref 134–144)
Total Protein: 6.6 g/dL (ref 6.0–8.5)

## 2018-09-17 LAB — MAGNESIUM: Magnesium: 1.9 mg/dL (ref 1.6–2.3)

## 2018-09-17 LAB — EVEROLIMUS: EVEROLIMUS: 5.2 ng/mL (ref 3.0–8.0)

## 2018-09-19 ENCOUNTER — Encounter: Payer: Self-pay | Admitting: Physical Therapy

## 2018-09-19 ENCOUNTER — Ambulatory Visit: Payer: Medicare Other | Admitting: Physical Therapy

## 2018-09-19 DIAGNOSIS — M25512 Pain in left shoulder: Secondary | ICD-10-CM

## 2018-09-19 DIAGNOSIS — G8929 Other chronic pain: Secondary | ICD-10-CM | POA: Diagnosis not present

## 2018-09-19 DIAGNOSIS — M6281 Muscle weakness (generalized): Secondary | ICD-10-CM

## 2018-09-19 DIAGNOSIS — M25612 Stiffness of left shoulder, not elsewhere classified: Secondary | ICD-10-CM

## 2018-09-19 NOTE — Therapy (Signed)
[facility] [place], [place], [address] Phone: [phone]   Fax:  [phone]  Physical Therapy Treatment  Patient Details  Name: Cassidy Barber MRN: [id number] Date of Birth: 01-31-1951 Referring Provider (PT): Jenetta Loges, [place]   Encounter Date: 09/19/2018  PT End of Session - 09/19/18 0907    Visit Number  14    Number of Visits  30    Date for PT Re-Evaluation  10/24/18    Authorization Type  FOTO every 5th visit, Progress note every 10th visit    PT Start Time  0900    PT Stop Time  0948    PT Time Calculation (min)  48 min    Activity Tolerance  Patient tolerated treatment well    Behavior During Therapy  [facility] for tasks assessed/performed       Past Medical History:  Diagnosis Date  . Anemia of chronic disease 10/26/2013  . Hernia of unspecified site of abdominal cavity without mention of obstruction or gangrene   . Hypertension   . Liver disease   . Lynch syndrome   . Lynch syndrome   . Primary biliary cirrhosis ([place]) 10/26/2013  . Thyroid disease     Past Surgical History:  Procedure Laterality Date  . ABDOMINAL HERNIA REPAIR     Patient's states that she has had 8- 9 hernia surgeries  . ABDOMINAL HYSTERECTOMY    . CHOLECYSTECTOMY  2007  . COLON RESECTION    . COLON SURGERY  2008   Done at [facility]  . COLONOSCOPY     Done at [facility]  . ESOPHAGOGASTRODUODENOSCOPY N/A 08/21/2018   Procedure: ESOPHAGOGASTRODUODENOSCOPY (EGD);  Surgeon: Rogene Houston, MD;  [facility];  Service: Endoscopy;  Laterality: N/A;  . EYE SURGERY     lasix  . FLEXIBLE SIGMOIDOSCOPY N/A 10/20/2015   Procedure: FLEXIBLE SIGMOIDOSCOPY;  Surgeon: Rogene Houston, MD;  [facility];  Service: Endoscopy;  Laterality: N/A;  70 - Dr Laural Golden has meeting until 1:00  . FLEXIBLE SIGMOIDOSCOPY N/A 07/11/2016   Procedure: FLEXIBLE SIGMOIDOSCOPY;  Surgeon: Rogene Houston, MD;  [facility];  Service: Endoscopy;   Laterality: N/A;  1200  . FLEXIBLE SIGMOIDOSCOPY N/A 08/09/2017   Procedure: FLEXIBLE SIGMOIDOSCOPY;  Surgeon: Rogene Houston, MD;  [facility];  Service: Endoscopy;  Laterality: N/A;  1:00  . FLEXIBLE SIGMOIDOSCOPY N/A 08/21/2018   Procedure: FLEXIBLE SIGMOIDOSCOPY;  Surgeon: Rogene Houston, MD;  [facility];  Service: Endoscopy;  Laterality: N/A;  . HERNIA REPAIR    . LIVER TRANSPLANT  [id number]  . multiple skin cancers removed    . POLYPECTOMY  08/09/2017   Procedure: POLYPECTOMY;  Surgeon: Rogene Houston, MD;  [facility];  Service: Endoscopy;;  colon small bowel  . REVERSE SHOULDER ARTHROPLASTY Left 07/17/2018  . REVERSE SHOULDER ARTHROPLASTY Left 07/17/2018   Procedure: LEFT REVERSE SHOULDER ARTHROPLASTY;  Surgeon: Justice Britain, MD;  Location: [place];  Service: Orthopedics;  Laterality: Left;  148min  . SPLENECTOMY  2006  . UPPER GASTROINTESTINAL ENDOSCOPY     Done at [facility]    There were no vitals filed for this visit.  Subjective Assessment - 09/19/18 0904    Subjective  Patient reports shoulder is stiff this morning    Pertinent History  L reverse total shoulder replacement 07/17/18, OA    Limitations  Lifting;House hold activities    Patient Stated Goals  use arm again and  return to gardening    Currently in Pain?  No/denies                       [facility] Adult PT Treatment/Exercise - 09/19/18 0001      Exercises   Exercises  Shoulder      Shoulder Exercises: Sidelying   External Rotation  AROM;Left;20 reps    Flexion  AROM;Left;20 reps      Shoulder Exercises: Standing   Other Standing Exercises  Wall ladder x3 minutes to level 25      Shoulder Exercises: Pulleys   Flexion  5 minutes      Shoulder Exercises: ROM/Strengthening   UBE (Upper Arm Bike)  120 RPM 100min 3 fwd, 3 bwd    Wall Wash  wall wash 2x15 reps with elbow flexion followed by flexion wall wash      Shoulder Exercises: Isometric Strengthening    External Rotation  5X5"    Internal Rotation  5X5"      Manual Therapy   Manual Therapy  Passive ROM    Passive ROM  PROM of L shoulder into flex, ER, IR with gentle holds at end range followed by rhythmic stabilization at 90 degrees of flexion and varying degrees of ER                  PT Long Term Goals - 09/15/18 1324      PT LONG TERM GOAL #1   Title  Patient will be independent with HEP and its progression    Time  6    Period  Weeks    Status  Achieved      PT LONG TERM GOAL #2   Title  Patient will demonstrate 130+ degrees of left shoulder flexion AROM to improve ability to perform functional tasks    Time  6    Period  Weeks    Status  On-going      PT LONG TERM GOAL #3   Title  Patient will demonstrate 65+ degrees of left ER AROM to don/doff apparel.    Time  6    Period  Weeks    Status  On-going      PT LONG TERM GOAL #4   Title  Patient will demonstrate 4/5 or greater left shoulder MMT in all planes to improve stability during functional tasks.    Time  6    Period  Weeks    Status  On-going      PT LONG TERM GOAL #5   Title  Patient will report ability to perform all ADLs independently with less than 4/10 left shoulder pain.    Time  6    Period  Weeks    Status  On-going            Plan - 09/19/18 1000    Clinical Impression Statement  Patient was able to tolerate progression of treatment well. Patient continues to demonstrate left shoulder weakness and still demonstrates difficulties with antigravity exercises. Patient opted out of modalities as she is not in pain and can ice at home.     Clinical Presentation  Stable    Clinical Decision Making  Low    Rehab Potential  Good    PT Frequency  3x / week    PT Duration  6 weeks    PT Treatment/Interventions  ADLs/Self Care Home Management;Cryotherapy;Electrical Stimulation;Ultrasound;Moist Heat;Therapeutic activities;Therapeutic exercise;Patient/family education;Manual techniques;Passive  range of motion;Dry needling;Vasopneumatic Device;Neuromuscular re-education  PT Next Visit Plan  see protocol, 8 weeks post op 09/11/18; pulleys, isometrics ABD, ER, AAROM flexion PROM flexion to 90-100 degrees, 30 degrees ER in scapular plane, IR at 45 deg in scap plane; modalities PRN    Consulted and Agree with Plan of Care  Patient       Patient will benefit from skilled therapeutic intervention in order to improve the following deficits and impairments:  Decreased activity tolerance, Decreased range of motion, Decreased strength, Pain, Impaired UE functional use  Visit Diagnosis: Stiffness of left shoulder, not elsewhere classified  Muscle weakness (generalized)  Acute pain of left shoulder     Problem List Patient Active Problem List   Diagnosis Date Noted  . Urticaria 09/08/2018  . S/p reverse total shoulder arthroplasty 07/17/2018  . History of colon cancer 06/12/2018  . History of colonic polyps 06/12/2018  . 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  . Gastroesophageal reflux disease 06/12/2018  . Chronic diarrhea 06/26/2017  . Iron deficiency anemia 06/26/2017  . GAD (generalized anxiety disorder) 08/28/2016  . Vitamin D deficiency 08/28/2016  . GERD (gastroesophageal reflux disease) 08/28/2016  . Insomnia 08/28/2016  . Convulsions ([place]) 11/30/2015  . Liver transplant recipient [facility]) 11/30/2015  . Seizures ([place]) 09/27/2015  . Essential hypertension, benign 03/10/2015  . Hx of liver transplant ([place]) 12/08/2014  . Diarrhea 12/08/2014  . Colon cancer ([place]) 12/08/2014  . Hepatic encephalopathy ([place]) 10/26/2013  . Anemia of chronic disease 10/26/2013  . Personal history of colonic polyps 12/27/2012  . PBC (primary biliary cirrhosis) 03/24/2012  . Hypothyroidism 03/24/2012  . Ventral hernia, recurrent 03/24/2012   Gabriela Eves, PT, DPT 09/19/2018, 10:06 AM  [facility] [address] [place], [place], [address] Phone:  [phone]   Fax:  [phone]  Name: Cassidy Barber MRN: [id number] Date of Birth: [birth year]

## 2018-09-22 ENCOUNTER — Ambulatory Visit: Payer: Medicare Other | Admitting: Physical Therapy

## 2018-09-22 ENCOUNTER — Encounter: Payer: Self-pay | Admitting: Physical Therapy

## 2018-09-22 DIAGNOSIS — M25512 Pain in left shoulder: Secondary | ICD-10-CM | POA: Diagnosis not present

## 2018-09-22 DIAGNOSIS — M6281 Muscle weakness (generalized): Secondary | ICD-10-CM

## 2018-09-22 DIAGNOSIS — M25612 Stiffness of left shoulder, not elsewhere classified: Secondary | ICD-10-CM | POA: Diagnosis not present

## 2018-09-22 DIAGNOSIS — G8929 Other chronic pain: Secondary | ICD-10-CM | POA: Diagnosis not present

## 2018-09-22 NOTE — Therapy (Signed)
[facility] [place], [place], [address] Phone: [phone]   Fax:  [phone]  Physical Therapy Treatment  Patient Details  Name: Cassidy Barber MRN: [id number] Date of Birth: 04/08/1951 Referring Provider (PT): Jenetta Loges, [place]   Encounter Date: 09/22/2018  PT End of Session - 09/22/18 0901    Visit Number  15    Number of Visits  30    Date for PT Re-Evaluation  10/24/18    Authorization Type  FOTO every 5th visit, Progress note every 10th visit    PT Start Time  0900    PT Stop Time  1004    PT Time Calculation (min)  64 min    Activity Tolerance  Patient tolerated treatment well    Behavior During Therapy  [facility] for tasks assessed/performed       Past Medical History:  Diagnosis Date  . Anemia of chronic disease 10/26/2013  . Hernia of unspecified site of abdominal cavity without mention of obstruction or gangrene   . Hypertension   . Liver disease   . Lynch syndrome   . Lynch syndrome   . Primary biliary cirrhosis ([place]) 10/26/2013  . Thyroid disease     Past Surgical History:  Procedure Laterality Date  . ABDOMINAL HERNIA REPAIR     Patient's states that she has had 8- 9 hernia surgeries  . ABDOMINAL HYSTERECTOMY    . CHOLECYSTECTOMY  2007  . COLON RESECTION    . COLON SURGERY  2008   Done at [facility]  . COLONOSCOPY     Done at [facility]  . ESOPHAGOGASTRODUODENOSCOPY N/A 08/21/2018   Procedure: ESOPHAGOGASTRODUODENOSCOPY (EGD);  Surgeon: Rogene Houston, MD;  [facility];  Service: Endoscopy;  Laterality: N/A;  . EYE SURGERY     lasix  . FLEXIBLE SIGMOIDOSCOPY N/A 10/20/2015   Procedure: FLEXIBLE SIGMOIDOSCOPY;  Surgeon: Rogene Houston, MD;  [facility];  Service: Endoscopy;  Laterality: N/A;  71 - Dr Laural Golden has meeting until 1:00  . FLEXIBLE SIGMOIDOSCOPY N/A 07/11/2016   Procedure: FLEXIBLE SIGMOIDOSCOPY;  Surgeon: Rogene Houston, MD;  [facility];  Service: Endoscopy;   Laterality: N/A;  1200  . FLEXIBLE SIGMOIDOSCOPY N/A 08/09/2017   Procedure: FLEXIBLE SIGMOIDOSCOPY;  Surgeon: Rogene Houston, MD;  [facility];  Service: Endoscopy;  Laterality: N/A;  1:00  . FLEXIBLE SIGMOIDOSCOPY N/A 08/21/2018   Procedure: FLEXIBLE SIGMOIDOSCOPY;  Surgeon: Rogene Houston, MD;  [facility];  Service: Endoscopy;  Laterality: N/A;  . HERNIA REPAIR    . LIVER TRANSPLANT  [id number]  . multiple skin cancers removed    . POLYPECTOMY  08/09/2017   Procedure: POLYPECTOMY;  Surgeon: Rogene Houston, MD;  [facility];  Service: Endoscopy;;  colon small bowel  . REVERSE SHOULDER ARTHROPLASTY Left 07/17/2018  . REVERSE SHOULDER ARTHROPLASTY Left 07/17/2018   Procedure: LEFT REVERSE SHOULDER ARTHROPLASTY;  Surgeon: Justice Britain, MD;  Location: [place];  Service: Orthopedics;  Laterality: Left;  169min  . SPLENECTOMY  2006  . UPPER GASTROINTESTINAL ENDOSCOPY     Done at [facility]    There were no vitals filed for this visit.  Subjective Assessment - 09/22/18 0956    Subjective  Reports she has been doing her HEP but reported feeling some pain during sidelying exercises yesterday.    Pertinent History  L reverse total shoulder replacement 07/17/18, OA    Limitations  Lifting;House hold activities  Patient Stated Goals  use arm again and return to gardening    Currently in Pain?  No/denies         [facility] PT Assessment - 09/22/18 0001      Assessment   Medical Diagnosis  left reverse total shoulder replacement    Referring Provider (PT)  Jenetta Loges, PA-C    Onset Date/Surgical Date  07/17/18    Hand Dominance  Right    Next MD Visit  December     Prior Therapy  yes      Precautions   Precautions  Shoulder    Type of Shoulder Precautions  see media for protocol                   [facility] Adult PT Treatment/Exercise - 09/22/18 0001      Exercises   Exercises  Shoulder      Shoulder Exercises: Supine   Flexion  AROM;Left;20  reps    ABduction  AROM;Both;20 reps      Shoulder Exercises: Sidelying   External Rotation  AROM;Left;20 reps    Flexion  AROM;Left;20 reps      Shoulder Exercises: Standing   Flexion  AROM;AAROM;Left;20 reps   2x10   Other Standing Exercises  Wall ladder x3 minutes to level 25      Shoulder Exercises: Pulleys   Flexion  5 minutes      Shoulder Exercises: ROM/Strengthening   UBE (Upper Arm Bike)  120 RPM 43min 4 fwd, 4 bwd    Wall Wash  wall wash 2x15 reps with elbow flexion followed by flexion wall wash      Shoulder Exercises: Isometric Strengthening   External Rotation  Supine;5X10"    Internal Rotation  Supine;5X10"      Modalities   Modalities  Electrical Stimulation;Vasopneumatic      Vasopneumatic   Number Minutes Vasopneumatic   15 minutes    Vasopnuematic Location   Shoulder    Vasopneumatic Pressure  Low    Vasopneumatic Temperature   34      Manual Therapy   Manual Therapy  Passive ROM    Passive ROM  PROM of L shoulder into flex, ER, IR with gentle holds at end range followed by rhythmic stabilization at 90 degrees of flexion and varying degrees of ER                  PT Long Term Goals - 09/15/18 2536      PT LONG TERM GOAL #1   Title  Patient will be independent with HEP and its progression    Time  6    Period  Weeks    Status  Achieved      PT LONG TERM GOAL #2   Title  Patient will demonstrate 130+ degrees of left shoulder flexion AROM to improve ability to perform functional tasks    Time  6    Period  Weeks    Status  On-going      PT LONG TERM GOAL #3   Title  Patient will demonstrate 65+ degrees of left ER AROM to don/doff apparel.    Time  6    Period  Weeks    Status  On-going      PT LONG TERM GOAL #4   Title  Patient will demonstrate 4/5 or greater left shoulder MMT in all planes to improve stability during functional tasks.    Time  6    Period  Weeks  Status  On-going      PT LONG TERM GOAL #5   Title  Patient  will report ability to perform all ADLs independently with less than 4/10 left shoulder pain.    Time  6    Period  Weeks    Status  On-going            Plan - 09/22/18 0956    Clinical Impression Statement  Patient was able to tolerate treatment well with reports of muscle fatigue. Patient attempted AROM flexion but patient was unable to preform without shoulder elevation hiking as well as trunk momentum. Patient noted with improved form with assist from PT. Normal response to modalities upon removal.     Clinical Presentation  Stable    Clinical Decision Making  Low    Rehab Potential  Good    PT Frequency  3x / week    PT Duration  6 weeks    PT Treatment/Interventions  ADLs/Self Care Home Management;Cryotherapy;Electrical Stimulation;Ultrasound;Moist Heat;Therapeutic activities;Therapeutic exercise;Patient/family education;Manual techniques;Passive range of motion;Dry needling;Vasopneumatic Device;Neuromuscular re-education    PT Next Visit Plan  see protocol, 8 weeks post op 09/11/18; pulleys, isometrics ABD, ER, AAROM flexion PROM flexion to 90-100 degrees, 30 degrees ER in scapular plane, IR at 45 deg in scap plane; modalities PRN    Consulted and Agree with Plan of Care  Patient       Patient will benefit from skilled therapeutic intervention in order to improve the following deficits and impairments:  Decreased activity tolerance, Decreased range of motion, Decreased strength, Pain, Impaired UE functional use  Visit Diagnosis: Stiffness of left shoulder, not elsewhere classified  Muscle weakness (generalized)  Acute pain of left shoulder     Problem List Patient Active Problem List   Diagnosis Date Noted  . Urticaria 09/08/2018  . S/p reverse total shoulder arthroplasty 07/17/2018  . History of colon cancer 06/12/2018  . History of colonic polyps 06/12/2018  . 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  . Gastroesophageal reflux disease 06/12/2018  . Chronic diarrhea 06/26/2017   . Iron deficiency anemia 06/26/2017  . GAD (generalized anxiety disorder) 08/28/2016  . Vitamin D deficiency 08/28/2016  . GERD (gastroesophageal reflux disease) 08/28/2016  . Insomnia 08/28/2016  . Convulsions ([place]) 11/30/2015  . Liver transplant recipient [facility]) 11/30/2015  . Seizures ([place]) 09/27/2015  . Essential hypertension, benign 03/10/2015  . Hx of liver transplant ([place]) 12/08/2014  . Diarrhea 12/08/2014  . Colon cancer ([place]) 12/08/2014  . Hepatic encephalopathy ([place]) 10/26/2013  . Anemia of chronic disease 10/26/2013  . Personal history of colonic polyps 12/27/2012  . PBC (primary biliary cirrhosis) 03/24/2012  . Hypothyroidism 03/24/2012  . Ventral hernia, recurrent 03/24/2012   Gabriela Eves, PT, DPT 09/22/2018, 10:33 AM  [facility] [address] [place], [place], [address] Phone: [phone]   Fax:  [phone]  Name: MARGERET STACHNIK MRN: [id number] Date of Birth: [birth year]

## 2018-09-24 ENCOUNTER — Encounter: Payer: Medicare Other | Admitting: Physical Therapy

## 2018-09-25 DIAGNOSIS — H5211 Myopia, right eye: Secondary | ICD-10-CM | POA: Diagnosis not present

## 2018-09-26 ENCOUNTER — Ambulatory Visit: Payer: Medicare Other | Admitting: Physical Therapy

## 2018-09-26 ENCOUNTER — Encounter: Payer: Self-pay | Admitting: Physical Therapy

## 2018-09-26 DIAGNOSIS — M25612 Stiffness of left shoulder, not elsewhere classified: Secondary | ICD-10-CM | POA: Diagnosis not present

## 2018-09-26 DIAGNOSIS — M25512 Pain in left shoulder: Secondary | ICD-10-CM | POA: Diagnosis not present

## 2018-09-26 DIAGNOSIS — M6281 Muscle weakness (generalized): Secondary | ICD-10-CM

## 2018-09-26 DIAGNOSIS — G8929 Other chronic pain: Secondary | ICD-10-CM | POA: Diagnosis not present

## 2018-09-26 NOTE — Therapy (Signed)
[facility] [place], [place], [address] Phone: [phone]   Fax:  [phone]  Physical Therapy Treatment  Patient Details  Name: Cassidy Barber MRN: [id number] Date of Birth: 06/25/1951 Referring Provider (PT): Jenetta Loges, [place]   Encounter Date: 09/26/2018  PT End of Session - 09/26/18 0903    Visit Number  16    Number of Visits  30    Date for PT Re-Evaluation  10/24/18    Authorization Type  FOTO every 5th visit, Progress note every 10th visit    PT Start Time  0900    PT Stop Time  0943    PT Time Calculation (min)  43 min    Activity Tolerance  Patient tolerated treatment well    Behavior During Therapy  [facility] for tasks assessed/performed       Past Medical History:  Diagnosis Date  . Anemia of chronic disease 10/26/2013  . Hernia of unspecified site of abdominal cavity without mention of obstruction or gangrene   . Hypertension   . Liver disease   . Lynch syndrome   . Lynch syndrome   . Primary biliary cirrhosis ([place]) 10/26/2013  . Thyroid disease     Past Surgical History:  Procedure Laterality Date  . ABDOMINAL HERNIA REPAIR     Patient's states that she has had 8- 9 hernia surgeries  . ABDOMINAL HYSTERECTOMY    . CHOLECYSTECTOMY  2007  . COLON RESECTION    . COLON SURGERY  2008   Done at [facility]  . COLONOSCOPY     Done at [facility]  . ESOPHAGOGASTRODUODENOSCOPY N/A 08/21/2018   Procedure: ESOPHAGOGASTRODUODENOSCOPY (EGD);  Surgeon: Rogene Houston, MD;  [facility];  Service: Endoscopy;  Laterality: N/A;  . EYE SURGERY     lasix  . FLEXIBLE SIGMOIDOSCOPY N/A 10/20/2015   Procedure: FLEXIBLE SIGMOIDOSCOPY;  Surgeon: Rogene Houston, MD;  [facility];  Service: Endoscopy;  Laterality: N/A;  56 - Dr Laural Golden has meeting until 1:00  . FLEXIBLE SIGMOIDOSCOPY N/A 07/11/2016   Procedure: FLEXIBLE SIGMOIDOSCOPY;  Surgeon: Rogene Houston, MD;  [facility];  Service: Endoscopy;   Laterality: N/A;  1200  . FLEXIBLE SIGMOIDOSCOPY N/A 08/09/2017   Procedure: FLEXIBLE SIGMOIDOSCOPY;  Surgeon: Rogene Houston, MD;  [facility];  Service: Endoscopy;  Laterality: N/A;  1:00  . FLEXIBLE SIGMOIDOSCOPY N/A 08/21/2018   Procedure: FLEXIBLE SIGMOIDOSCOPY;  Surgeon: Rogene Houston, MD;  [facility];  Service: Endoscopy;  Laterality: N/A;  . HERNIA REPAIR    . LIVER TRANSPLANT  [id number]  . multiple skin cancers removed    . POLYPECTOMY  08/09/2017   Procedure: POLYPECTOMY;  Surgeon: Rogene Houston, MD;  [facility];  Service: Endoscopy;;  colon small bowel  . REVERSE SHOULDER ARTHROPLASTY Left 07/17/2018  . REVERSE SHOULDER ARTHROPLASTY Left 07/17/2018   Procedure: LEFT REVERSE SHOULDER ARTHROPLASTY;  Surgeon: Justice Britain, MD;  Location: [place];  Service: Orthopedics;  Laterality: Left;  192min  . SPLENECTOMY  2006  . UPPER GASTROINTESTINAL ENDOSCOPY     Done at [facility]    There were no vitals filed for this visit.  Subjective Assessment - 09/26/18 0902    Subjective  Reports feeling stiff this morning    Pertinent History  L reverse total shoulder replacement 07/17/18, OA    Limitations  Lifting;House hold activities    Patient Stated Goals  use arm again and return to  gardening    Currently in Pain?  No/denies         [facility] PT Assessment - 09/26/18 0001      Assessment   Medical Diagnosis  left reverse total shoulder replacement    Referring Provider (PT)  Jenetta Loges, PA-C    Onset Date/Surgical Date  07/17/18    Hand Dominance  Right    Next MD Visit  October 13, 2018    Prior Therapy  yes      Precautions   Precautions  Shoulder    Type of Shoulder Precautions  see media for protocol                   [facility] Adult PT Treatment/Exercise - 09/26/18 0001      Exercises   Exercises  Shoulder      Shoulder Exercises: Seated   External Rotation  AROM;Left;20 reps;Theraband;Strengthening   x20 AAROM; x10 with  yellow Tband   Theraband Level (Shoulder External Rotation)  Level 1 (Yellow)    Internal Rotation  AROM;Strengthening;Left;20 reps;Theraband   x20 AROM x20 with yellow Tband     Shoulder Exercises: Standing   Other Standing Exercises  IR & ER isometric step outs yellow band x10 each      Shoulder Exercises: Pulleys   Flexion  5 minutes      Shoulder Exercises: ROM/Strengthening   UBE (Upper Arm Bike)  120 RPM 6min 4 fwd, 4 bwd    Wall Wash  wall wash with elbow flexion followed by flexion wall wash 2 minutes each      Vasopneumatic   Vasopneumatic Temperature   34      Manual Therapy   Manual Therapy  Passive ROM    Passive ROM  PROM of L shoulder into flex, ER, IR with gentle holds at end range followed by rhythmic stabilization at 90 degrees of flexion and varying degrees of ER                  PT Long Term Goals - 09/15/18 4627      PT LONG TERM GOAL #1   Title  Patient will be independent with HEP and its progression    Time  6    Period  Weeks    Status  Achieved      PT LONG TERM GOAL #2   Title  Patient will demonstrate 130+ degrees of left shoulder flexion AROM to improve ability to perform functional tasks    Time  6    Period  Weeks    Status  On-going      PT LONG TERM GOAL #3   Title  Patient will demonstrate 65+ degrees of left ER AROM to don/doff apparel.    Time  6    Period  Weeks    Status  On-going      PT LONG TERM GOAL #4   Title  Patient will demonstrate 4/5 or greater left shoulder MMT in all planes to improve stability during functional tasks.    Time  6    Period  Weeks    Status  On-going      PT LONG TERM GOAL #5   Title  Patient will report ability to perform all ADLs independently with less than 4/10 left shoulder pain.    Time  6    Period  Weeks    Status  On-going            Plan - 09/26/18  1009    Clinical Impression Statement  Patient was able to tolerate progression of treatment well with reports of muscle  fatigue. Patient noted with improved form with wall washes with minimal trunk compensations and shoulder elevation. Patient was able to tolerate progression of strengthening exercises with verbal and tactile cuing for form. Patient continues to demonstrate smooth arc of motion with PROM. Patient unable to actively perform behind the back ROM at this time. Patient opted out of modalities.    Clinical Presentation  Stable    Clinical Decision Making  Low    Rehab Potential  Good    PT Frequency  3x / week    PT Duration  6 weeks    PT Treatment/Interventions  ADLs/Self Care Home Management;Cryotherapy;Electrical Stimulation;Ultrasound;Moist Heat;Therapeutic activities;Therapeutic exercise;Patient/family education;Manual techniques;Passive range of motion;Dry needling;Vasopneumatic Device;Neuromuscular re-education    PT Next Visit Plan  see protocol, 8 weeks post op 09/11/18; pulleys, isometrics ABD, ER, AAROM flexion PROM flexion to 90-100 degrees, 30 degrees ER in scapular plane, IR at 45 deg in scap plane; modalities PRN    Consulted and Agree with Plan of Care  Patient       Patient will benefit from skilled therapeutic intervention in order to improve the following deficits and impairments:  Decreased activity tolerance, Decreased range of motion, Decreased strength, Pain, Impaired UE functional use  Visit Diagnosis: Stiffness of left shoulder, not elsewhere classified  Muscle weakness (generalized)     Problem List Patient Active Problem List   Diagnosis Date Noted  . Urticaria 09/08/2018  . S/p reverse total shoulder arthroplasty 07/17/2018  . History of colon cancer 06/12/2018  . History of colonic polyps 06/12/2018  . 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  . Gastroesophageal reflux disease 06/12/2018  . Chronic diarrhea 06/26/2017  . Iron deficiency anemia 06/26/2017  . GAD (generalized anxiety disorder) 08/28/2016  . Vitamin D deficiency 08/28/2016  . GERD (gastroesophageal reflux  disease) 08/28/2016  . Insomnia 08/28/2016  . Convulsions ([place]) 11/30/2015  . Liver transplant recipient [facility]) 11/30/2015  . Seizures ([place]) 09/27/2015  . Essential hypertension, benign 03/10/2015  . Hx of liver transplant ([place]) 12/08/2014  . Diarrhea 12/08/2014  . Colon cancer ([place]) 12/08/2014  . Hepatic encephalopathy ([place]) 10/26/2013  . Anemia of chronic disease 10/26/2013  . Personal history of colonic polyps 12/27/2012  . PBC (primary biliary cirrhosis) 03/24/2012  . Hypothyroidism 03/24/2012  . Ventral hernia, recurrent 03/24/2012    Gabriela Eves, PT, DPT 09/26/2018, 10:12 AM  [facility] [address] [place], [place], [address] Phone: [phone]   Fax:  [phone]  Name: Cassidy Barber MRN: [id number] Date of Birth: Jan 15, 1951

## 2018-09-28 DIAGNOSIS — Z944 Liver transplant status: Secondary | ICD-10-CM | POA: Diagnosis not present

## 2018-09-29 ENCOUNTER — Ambulatory Visit: Payer: Medicare Other | Admitting: Physical Therapy

## 2018-09-29 ENCOUNTER — Encounter: Payer: Self-pay | Admitting: Physical Therapy

## 2018-09-29 DIAGNOSIS — M25612 Stiffness of left shoulder, not elsewhere classified: Secondary | ICD-10-CM | POA: Diagnosis not present

## 2018-09-29 DIAGNOSIS — M6281 Muscle weakness (generalized): Secondary | ICD-10-CM | POA: Diagnosis not present

## 2018-09-29 DIAGNOSIS — M25512 Pain in left shoulder: Secondary | ICD-10-CM

## 2018-09-29 DIAGNOSIS — G8929 Other chronic pain: Secondary | ICD-10-CM | POA: Diagnosis not present

## 2018-09-29 NOTE — Therapy (Signed)
[facility] [place], [place], [address] Phone: [phone]   Fax:  [phone]  Physical Therapy Treatment  Patient Details  Name: Cassidy Barber MRN: [id number] Date of Birth: 06-27-1951 Referring Provider (PT): Jenetta Loges, [place]   Encounter Date: 09/29/2018  PT End of Session - 09/29/18 0948    Visit Number  17    Number of Visits  30    Authorization Type  FOTO every 5th visit, Progress note every 10th visit    PT Start Time  0900    PT Stop Time  0946    PT Time Calculation (min)  46 min    Activity Tolerance  Patient tolerated treatment well    Behavior During Therapy  [facility] for tasks assessed/performed       Past Medical History:  Diagnosis Date  . Anemia of chronic disease 10/26/2013  . Hernia of unspecified site of abdominal cavity without mention of obstruction or gangrene   . Hypertension   . Liver disease   . Lynch syndrome   . Lynch syndrome   . Primary biliary cirrhosis ([place]) 10/26/2013  . Thyroid disease     Past Surgical History:  Procedure Laterality Date  . ABDOMINAL HERNIA REPAIR     Patient's states that she has had 8- 9 hernia surgeries  . ABDOMINAL HYSTERECTOMY    . CHOLECYSTECTOMY  2007  . COLON RESECTION    . COLON SURGERY  2008   Done at [facility]  . COLONOSCOPY     Done at [facility]  . ESOPHAGOGASTRODUODENOSCOPY N/A 08/21/2018   Procedure: ESOPHAGOGASTRODUODENOSCOPY (EGD);  Surgeon: Rogene Houston, MD;  [facility];  Service: Endoscopy;  Laterality: N/A;  . EYE SURGERY     lasix  . FLEXIBLE SIGMOIDOSCOPY N/A 10/20/2015   Procedure: FLEXIBLE SIGMOIDOSCOPY;  Surgeon: Rogene Houston, MD;  [facility];  Service: Endoscopy;  Laterality: N/A;  61 - Dr Laural Golden has meeting until 1:00  . FLEXIBLE SIGMOIDOSCOPY N/A 07/11/2016   Procedure: FLEXIBLE SIGMOIDOSCOPY;  Surgeon: Rogene Houston, MD;  [facility];  Service: Endoscopy;  Laterality: N/A;  1200  . FLEXIBLE  SIGMOIDOSCOPY N/A 08/09/2017   Procedure: FLEXIBLE SIGMOIDOSCOPY;  Surgeon: Rogene Houston, MD;  [facility];  Service: Endoscopy;  Laterality: N/A;  1:00  . FLEXIBLE SIGMOIDOSCOPY N/A 08/21/2018   Procedure: FLEXIBLE SIGMOIDOSCOPY;  Surgeon: Rogene Houston, MD;  [facility];  Service: Endoscopy;  Laterality: N/A;  . HERNIA REPAIR    . LIVER TRANSPLANT  [id number]  . multiple skin cancers removed    . POLYPECTOMY  08/09/2017   Procedure: POLYPECTOMY;  Surgeon: Rogene Houston, MD;  [facility];  Service: Endoscopy;;  colon small bowel  . REVERSE SHOULDER ARTHROPLASTY Left 07/17/2018  . REVERSE SHOULDER ARTHROPLASTY Left 07/17/2018   Procedure: LEFT REVERSE SHOULDER ARTHROPLASTY;  Surgeon: Justice Britain, MD;  Location: [place];  Service: Orthopedics;  Laterality: Left;  17min  . SPLENECTOMY  2006  . UPPER GASTROINTESTINAL ENDOSCOPY     Done at [facility]    There were no vitals filed for this visit.  Subjective Assessment - 09/29/18 0913    Subjective  Patient reports feeling good this morning and stated she was a little sore after last visit but soreness dissipated.    Pertinent History  L reverse total shoulder replacement 07/17/18, OA    Limitations  Lifting;House hold activities    Patient Stated Goals  use  arm again and return to gardening    Currently in Pain?  No/denies         [facility] PT Assessment - 09/29/18 0001      Assessment   Medical Diagnosis  left reverse total shoulder replacement    Referring Provider (PT)  Jenetta Loges, PA-C    Onset Date/Surgical Date  07/17/18    Hand Dominance  Right    Next MD Visit  October 13, 2018    Prior Therapy  yes      Precautions   Precautions  Shoulder    Type of Shoulder Precautions  see media for protocol                   [facility] Adult PT Treatment/Exercise - 09/29/18 0001      Exercises   Exercises  Shoulder      Shoulder Exercises: Standing   Protraction  AROM;Left;20 reps     Extension  AAROM;Both;10 reps    Row  Left;Strengthening;20 reps;Theraband    Theraband Level (Shoulder Row)  Level 1 (Yellow)    Other Standing Exercises  Wall ladder x3 minutes to level 25    Other Standing Exercises  IR & ER isometric step outs yellow band 2x10 each; followed by AAROM shoulder adduction and IR      Shoulder Exercises: Pulleys   Flexion  5 minutes      Shoulder Exercises: ROM/Strengthening   UBE (Upper Arm Bike)  120 RPM 19min 4 fwd, 4 bwd    Wall Wash  wall wash with elbow flexion followed by flexion wall wash 2 minutes each      Manual Therapy   Manual Therapy  Passive ROM    Passive ROM  PROM of L shoulder into flex, ER, IR at 90 ABD with gentle holds at end range followed by rhythmic stabilization at 90 degrees of flexion and varying degrees of ER                  PT Long Term Goals - 09/15/18 4742      PT LONG TERM GOAL #1   Title  Patient will be independent with HEP and its progression    Time  6    Period  Weeks    Status  Achieved      PT LONG TERM GOAL #2   Title  Patient will demonstrate 130+ degrees of left shoulder flexion AROM to improve ability to perform functional tasks    Time  6    Period  Weeks    Status  On-going      PT LONG TERM GOAL #3   Title  Patient will demonstrate 65+ degrees of left ER AROM to don/doff apparel.    Time  6    Period  Weeks    Status  On-going      PT LONG TERM GOAL #4   Title  Patient will demonstrate 4/5 or greater left shoulder MMT in all planes to improve stability during functional tasks.    Time  6    Period  Weeks    Status  On-going      PT LONG TERM GOAL #5   Title  Patient will report ability to perform all ADLs independently with less than 4/10 left shoulder pain.    Time  6    Period  Weeks    Status  On-going            Plan - 09/29/18 1249  Clinical Impression Statement  Patient was able to tolerate progression of treatment well but required intermittent tactile cues  for proper form. Patient was able to reach 25 on the wall ladder today with just a "stretch" around her axilla. Patient opted out modalities due to having another appointment after.     Clinical Presentation  Stable    Clinical Decision Making  Low    Rehab Potential  Good    PT Frequency  3x / week    PT Duration  6 weeks    PT Treatment/Interventions  ADLs/Self Care Home Management;Cryotherapy;Electrical Stimulation;Ultrasound;Moist Heat;Therapeutic activities;Therapeutic exercise;Patient/family education;Manual techniques;Passive range of motion;Dry needling;Vasopneumatic Device;Neuromuscular re-education    PT Next Visit Plan  see protocol, 12 weeks post op 12/09/17; pulleys, isometrics ABD, ER, AAROM flexion PROM flexion to 90-100 degrees, 30 degrees ER in scapular plane, IR at 45 deg in scap plane; modalities PRN    Consulted and Agree with Plan of Care  Patient       Patient will benefit from skilled therapeutic intervention in order to improve the following deficits and impairments:  Decreased activity tolerance, Decreased range of motion, Decreased strength, Pain, Impaired UE functional use  Visit Diagnosis: Stiffness of left shoulder, not elsewhere classified  Muscle weakness (generalized)  Acute pain of left shoulder     Problem List Patient Active Problem List   Diagnosis Date Noted  . Urticaria 09/08/2018  . S/p reverse total shoulder arthroplasty 07/17/2018  . History of colon cancer 06/12/2018  . History of colonic polyps 06/12/2018  . 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  . Gastroesophageal reflux disease 06/12/2018  . Chronic diarrhea 06/26/2017  . Iron deficiency anemia 06/26/2017  . GAD (generalized anxiety disorder) 08/28/2016  . Vitamin D deficiency 08/28/2016  . GERD (gastroesophageal reflux disease) 08/28/2016  . Insomnia 08/28/2016  . Convulsions ([place]) 11/30/2015  . Liver transplant recipient [facility]) 11/30/2015  . Seizures ([place]) 09/27/2015  . Essential  hypertension, benign 03/10/2015  . Hx of liver transplant ([place]) 12/08/2014  . Diarrhea 12/08/2014  . Colon cancer ([place]) 12/08/2014  . Hepatic encephalopathy ([place]) 10/26/2013  . Anemia of chronic disease 10/26/2013  . Personal history of colonic polyps 12/27/2012  . PBC (primary biliary cirrhosis) 03/24/2012  . Hypothyroidism 03/24/2012  . Ventral hernia, recurrent 03/24/2012    Gabriela Eves, PT, DPT 09/29/2018, 1:16 PM  [facility] [address] [place], [place], [address] Phone: [phone]   Fax:  [phone]  Name: Cassidy Barber MRN: [id number] Date of Birth: 02-03-1951

## 2018-10-01 ENCOUNTER — Ambulatory Visit: Payer: Medicare Other | Admitting: Physical Therapy

## 2018-10-01 ENCOUNTER — Other Ambulatory Visit: Payer: Self-pay | Admitting: Physician Assistant

## 2018-10-01 DIAGNOSIS — L7682 Other postprocedural complications of skin and subcutaneous tissue: Secondary | ICD-10-CM | POA: Diagnosis not present

## 2018-10-01 DIAGNOSIS — L905 Scar conditions and fibrosis of skin: Secondary | ICD-10-CM | POA: Diagnosis not present

## 2018-10-01 DIAGNOSIS — D485 Neoplasm of uncertain behavior of skin: Secondary | ICD-10-CM | POA: Diagnosis not present

## 2018-10-01 DIAGNOSIS — M6281 Muscle weakness (generalized): Secondary | ICD-10-CM

## 2018-10-01 DIAGNOSIS — D0471 Carcinoma in situ of skin of right lower limb, including hip: Secondary | ICD-10-CM | POA: Diagnosis not present

## 2018-10-01 DIAGNOSIS — M25512 Pain in left shoulder: Secondary | ICD-10-CM

## 2018-10-01 DIAGNOSIS — G8929 Other chronic pain: Secondary | ICD-10-CM | POA: Diagnosis not present

## 2018-10-01 DIAGNOSIS — M25612 Stiffness of left shoulder, not elsewhere classified: Secondary | ICD-10-CM | POA: Diagnosis not present

## 2018-10-01 NOTE — Therapy (Signed)
[facility] [place], [place], [address] Phone: [phone]   Fax:  [phone]  Physical Therapy Treatment  Patient Details  Name: Cassidy Barber MRN: [id number] Date of Birth: 03-30-1951 Referring Provider (PT): Jenetta Loges, [place]   Encounter Date: 10/01/2018  PT End of Session - 10/01/18 0939    Visit Number  18    Number of Visits  30    Date for PT Re-Evaluation  10/24/18    Authorization Type  FOTO every 5th visit, Progress note every 10th visit    PT Start Time  0900    PT Stop Time  0951    PT Time Calculation (min)  51 min    Activity Tolerance  Patient tolerated treatment well    Behavior During Therapy  [facility] for tasks assessed/performed       Past Medical History:  Diagnosis Date  . Anemia of chronic disease 10/26/2013  . Hernia of unspecified site of abdominal cavity without mention of obstruction or gangrene   . Hypertension   . Liver disease   . Lynch syndrome   . Lynch syndrome   . Primary biliary cirrhosis ([place]) 10/26/2013  . Thyroid disease     Past Surgical History:  Procedure Laterality Date  . ABDOMINAL HERNIA REPAIR     Patient's states that she has had 8- 9 hernia surgeries  . ABDOMINAL HYSTERECTOMY    . CHOLECYSTECTOMY  2007  . COLON RESECTION    . COLON SURGERY  2008   Done at [facility]  . COLONOSCOPY     Done at [facility]  . ESOPHAGOGASTRODUODENOSCOPY N/A 08/21/2018   Procedure: ESOPHAGOGASTRODUODENOSCOPY (EGD);  Surgeon: Rogene Houston, MD;  [facility];  Service: Endoscopy;  Laterality: N/A;  . EYE SURGERY     lasix  . FLEXIBLE SIGMOIDOSCOPY N/A 10/20/2015   Procedure: FLEXIBLE SIGMOIDOSCOPY;  Surgeon: Rogene Houston, MD;  [facility];  Service: Endoscopy;  Laterality: N/A;  60 - Dr Laural Golden has meeting until 1:00  . FLEXIBLE SIGMOIDOSCOPY N/A 07/11/2016   Procedure: FLEXIBLE SIGMOIDOSCOPY;  Surgeon: Rogene Houston, MD;  [facility];  Service: Endoscopy;   Laterality: N/A;  1200  . FLEXIBLE SIGMOIDOSCOPY N/A 08/09/2017   Procedure: FLEXIBLE SIGMOIDOSCOPY;  Surgeon: Rogene Houston, MD;  [facility];  Service: Endoscopy;  Laterality: N/A;  1:00  . FLEXIBLE SIGMOIDOSCOPY N/A 08/21/2018   Procedure: FLEXIBLE SIGMOIDOSCOPY;  Surgeon: Rogene Houston, MD;  [facility];  Service: Endoscopy;  Laterality: N/A;  . HERNIA REPAIR    . LIVER TRANSPLANT  [id number]  . multiple skin cancers removed    . POLYPECTOMY  08/09/2017   Procedure: POLYPECTOMY;  Surgeon: Rogene Houston, MD;  [facility];  Service: Endoscopy;;  colon small bowel  . REVERSE SHOULDER ARTHROPLASTY Left 07/17/2018  . REVERSE SHOULDER ARTHROPLASTY Left 07/17/2018   Procedure: LEFT REVERSE SHOULDER ARTHROPLASTY;  Surgeon: Justice Britain, MD;  Location: [place];  Service: Orthopedics;  Laterality: Left;  150min  . SPLENECTOMY  2006  . UPPER GASTROINTESTINAL ENDOSCOPY     Done at [facility]    There were no vitals filed for this visit.      [facility] PT Assessment - 10/01/18 0001      Assessment   Medical Diagnosis  left reverse total shoulder replacement    Referring Provider (PT)  Jenetta Loges, PA-C    Onset Date/Surgical Date  07/17/18    Hand Dominance  Right    Next MD Visit  October 13, 2018    Prior Therapy  yes      Precautions   Precautions  Shoulder    Type of Shoulder Precautions  see media for protocol                   [facility] Adult PT Treatment/Exercise - 10/01/18 0001      Exercises   Exercises  Shoulder      Shoulder Exercises: Standing   Row  Left;Strengthening;20 reps;Theraband    Theraband Level (Shoulder Row)  Level 1 (Yellow)    Other Standing Exercises  IR & ER isometric step outs yellow band 2x10 each; followed by AAROM shoulder adduction and IR      Shoulder Exercises: Pulleys   Flexion  5 minutes      Shoulder Exercises: ROM/Strengthening   UBE (Upper Arm Bike)  90RPM 3min 4 fwd, 4 bwd    Wall Wash  wall  wash with elbow flexion followed by flexion wall wash 2 minutes each      Vasopneumatic   Number Minutes Vasopneumatic   15 minutes    Vasopnuematic Location   Shoulder    Vasopneumatic Pressure  Low    Vasopneumatic Temperature   34      Manual Therapy   Manual Therapy  Passive ROM    Passive ROM  PROM of L shoulder into flex, ER, IR at 90 ABD with gentle holds at end range followed by rhythmic stabilization at 90 degrees of flexion and varying degrees of ER                  PT Long Term Goals - 09/15/18 2992      PT LONG TERM GOAL #1   Title  Patient will be independent with HEP and its progression    Time  6    Period  Weeks    Status  Achieved      PT LONG TERM GOAL #2   Title  Patient will demonstrate 130+ degrees of left shoulder flexion AROM to improve ability to perform functional tasks    Time  6    Period  Weeks    Status  On-going      PT LONG TERM GOAL #3   Title  Patient will demonstrate 65+ degrees of left ER AROM to don/doff apparel.    Time  6    Period  Weeks    Status  On-going      PT LONG TERM GOAL #4   Title  Patient will demonstrate 4/5 or greater left shoulder MMT in all planes to improve stability during functional tasks.    Time  6    Period  Weeks    Status  On-going      PT LONG TERM GOAL #5   Title  Patient will report ability to perform all ADLs independently with less than 4/10 left shoulder pain.    Time  6    Period  Weeks    Status  On-going            Plan - 10/01/18 0941    Clinical Impression Statement  Patient was able to tolerate treatment well but noted with increased fatigue with exercises. Patient requires verbal cuing and tactile cuing to maintain proper form and technique for IR/ER isometric step outs. Patient noted smooth arc of motion with PROM. Patient requested Vasopneumatic device only today. Normal response upon removal.  Clinical Presentation  Stable    Clinical Decision Making  Low    Rehab  Potential  Good    PT Frequency  3x / week    PT Duration  6 weeks    PT Treatment/Interventions  ADLs/Self Care Home Management;Cryotherapy;Electrical Stimulation;Ultrasound;Moist Heat;Therapeutic activities;Therapeutic exercise;Patient/family education;Manual techniques;Passive range of motion;Dry needling;Vasopneumatic Device;Neuromuscular re-education    PT Next Visit Plan  see protocol, 12 weeks post op 10/09/18; pulleys, isometrics ABD, ER, AAROM flexion PROM flexion to 90-100 degrees, 30 degrees ER in scapular plane, IR at 45 deg in scap plane; modalities PRN    Consulted and Agree with Plan of Care  Patient       Patient will benefit from skilled therapeutic intervention in order to improve the following deficits and impairments:  Decreased activity tolerance, Decreased range of motion, Decreased strength, Pain, Impaired UE functional use  Visit Diagnosis: Muscle weakness (generalized)  Stiffness of left shoulder, not elsewhere classified  Acute pain of left shoulder     Problem List Patient Active Problem List   Diagnosis Date Noted  . Urticaria 09/08/2018  . S/p reverse total shoulder arthroplasty 07/17/2018  . History of colon cancer 06/12/2018  . History of colonic polyps 06/12/2018  . 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  . Gastroesophageal reflux disease 06/12/2018  . Chronic diarrhea 06/26/2017  . Iron deficiency anemia 06/26/2017  . GAD (generalized anxiety disorder) 08/28/2016  . Vitamin D deficiency 08/28/2016  . GERD (gastroesophageal reflux disease) 08/28/2016  . Insomnia 08/28/2016  . Convulsions ([place]) 11/30/2015  . Liver transplant recipient [facility]) 11/30/2015  . Seizures ([place]) 09/27/2015  . Essential hypertension, benign 03/10/2015  . Hx of liver transplant ([place]) 12/08/2014  . Diarrhea 12/08/2014  . Colon cancer ([place]) 12/08/2014  . Hepatic encephalopathy ([place]) 10/26/2013  . Anemia of chronic disease 10/26/2013  . Personal history of colonic polyps 12/27/2012  .  PBC (primary biliary cirrhosis) 03/24/2012  . Hypothyroidism 03/24/2012  . Ventral hernia, recurrent 03/24/2012   Gabriela Eves, PT, DPT 10/01/2018, 10:40 AM  [facility] [address] [place], [place], [address] Phone: [phone]   Fax:  [phone]  Name: GIARA MCGAUGHEY MRN: [id number] Date of Birth: 22-Nov-1950

## 2018-10-03 ENCOUNTER — Ambulatory Visit: Payer: Medicare Other | Admitting: Physical Therapy

## 2018-10-03 ENCOUNTER — Encounter: Payer: Self-pay | Admitting: Physical Therapy

## 2018-10-03 DIAGNOSIS — M25512 Pain in left shoulder: Secondary | ICD-10-CM

## 2018-10-03 DIAGNOSIS — M6281 Muscle weakness (generalized): Secondary | ICD-10-CM

## 2018-10-03 DIAGNOSIS — G8929 Other chronic pain: Secondary | ICD-10-CM

## 2018-10-03 DIAGNOSIS — M25612 Stiffness of left shoulder, not elsewhere classified: Secondary | ICD-10-CM | POA: Diagnosis not present

## 2018-10-03 NOTE — Therapy (Signed)
[facility] [place], [place], [address] Phone: [phone]   Fax:  [phone]  Physical Therapy Treatment  Patient Details  Name: Cassidy Barber MRN: [id number] Date of Birth: 01/26/1951 Referring Provider (PT): Jenetta Loges, [place]   Encounter Date: 10/03/2018  PT End of Session - 10/03/18 0904    Visit Number  19    Number of Visits  30    Date for PT Re-Evaluation  10/24/18    Authorization Type  FOTO every 5th visit, Progress note every 10th visit    PT Start Time  0900    PT Stop Time  0946    PT Time Calculation (min)  46 min    Activity Tolerance  Patient tolerated treatment well    Behavior During Therapy  [facility] for tasks assessed/performed       Past Medical History:  Diagnosis Date  . Anemia of chronic disease 10/26/2013  . Hernia of unspecified site of abdominal cavity without mention of obstruction or gangrene   . Hypertension   . Liver disease   . Lynch syndrome   . Lynch syndrome   . Primary biliary cirrhosis ([place]) 10/26/2013  . Thyroid disease     Past Surgical History:  Procedure Laterality Date  . ABDOMINAL HERNIA REPAIR     Patient's states that she has had 8- 9 hernia surgeries  . ABDOMINAL HYSTERECTOMY    . CHOLECYSTECTOMY  2007  . COLON RESECTION    . COLON SURGERY  2008   Done at [facility]  . COLONOSCOPY     Done at [facility]  . ESOPHAGOGASTRODUODENOSCOPY N/A 08/21/2018   Procedure: ESOPHAGOGASTRODUODENOSCOPY (EGD);  Surgeon: Rogene Houston, MD;  [facility];  Service: Endoscopy;  Laterality: N/A;  . EYE SURGERY     lasix  . FLEXIBLE SIGMOIDOSCOPY N/A 10/20/2015   Procedure: FLEXIBLE SIGMOIDOSCOPY;  Surgeon: Rogene Houston, MD;  [facility];  Service: Endoscopy;  Laterality: N/A;  83 - Dr Laural Golden has meeting until 1:00  . FLEXIBLE SIGMOIDOSCOPY N/A 07/11/2016   Procedure: FLEXIBLE SIGMOIDOSCOPY;  Surgeon: Rogene Houston, MD;  [facility];  Service: Endoscopy;   Laterality: N/A;  1200  . FLEXIBLE SIGMOIDOSCOPY N/A 08/09/2017   Procedure: FLEXIBLE SIGMOIDOSCOPY;  Surgeon: Rogene Houston, MD;  [facility];  Service: Endoscopy;  Laterality: N/A;  1:00  . FLEXIBLE SIGMOIDOSCOPY N/A 08/21/2018   Procedure: FLEXIBLE SIGMOIDOSCOPY;  Surgeon: Rogene Houston, MD;  [facility];  Service: Endoscopy;  Laterality: N/A;  . HERNIA REPAIR    . LIVER TRANSPLANT  [id number]  . multiple skin cancers removed    . POLYPECTOMY  08/09/2017   Procedure: POLYPECTOMY;  Surgeon: Rogene Houston, MD;  [facility];  Service: Endoscopy;;  colon small bowel  . REVERSE SHOULDER ARTHROPLASTY Left 07/17/2018  . REVERSE SHOULDER ARTHROPLASTY Left 07/17/2018   Procedure: LEFT REVERSE SHOULDER ARTHROPLASTY;  Surgeon: Justice Britain, MD;  Location: [place];  Service: Orthopedics;  Laterality: Left;  173min  . SPLENECTOMY  2006  . UPPER GASTROINTESTINAL ENDOSCOPY     Done at [facility]    There were no vitals filed for this visit.  Subjective Assessment - 10/03/18 0901    Subjective  Patient reported no new complaints. Reports still compliant with HEP    Pertinent History  L reverse total shoulder replacement 07/17/18, OA    Limitations  Lifting;House hold activities    Patient Stated Goals  use  arm again and return to gardening    Currently in Pain?  No/denies         [facility] PT Assessment - 10/03/18 0001      Assessment   Medical Diagnosis  left reverse total shoulder replacement    Referring Provider (PT)  Jenetta Loges, PA-C    Onset Date/Surgical Date  07/17/18    Hand Dominance  Right    Next MD Visit  October 13, 2018    Prior Therapy  yes      Precautions   Precautions  Shoulder    Type of Shoulder Precautions  see media for protocol                   [facility] Adult PT Treatment/Exercise - 10/03/18 0001      Exercises   Exercises  Shoulder      Shoulder Exercises: Standing   Internal Rotation  AAROM;Both;20 reps   with  cane   Flexion  20 reps   3x20 from varying heights   ABduction  AROM;Left;20 reps   scaption   Row  Left;Strengthening;20 reps;Theraband    Theraband Level (Shoulder Row)  Level 1 (Yellow)    Other Standing Exercises  IR & ER isometric step outs yellow band 2x10 each; followed by AAROM shoulder adduction      Shoulder Exercises: Pulleys   Flexion  5 minutes      Shoulder Exercises: ROM/Strengthening   UBE (Upper Arm Bike)  90RPM 83min 4 fwd, 4 bwd    Wall Wash  wall wash with elbow flexion followed by flexion wall wash 2 minutes each      Manual Therapy   Manual Therapy  Passive ROM;Scapular mobilization    Passive ROM  PROM of L shoulder into flex, ER, IR at 90 ABD with gentle holds at end range                  PT Long Term Goals - 09/15/18 2094      PT LONG TERM GOAL #1   Title  Patient will be independent with HEP and its progression    Time  6    Period  Weeks    Status  Achieved      PT LONG TERM GOAL #2   Title  Patient will demonstrate 130+ degrees of left shoulder flexion AROM to improve ability to perform functional tasks    Time  6    Period  Weeks    Status  On-going      PT LONG TERM GOAL #3   Title  Patient will demonstrate 65+ degrees of left ER AROM to don/doff apparel.    Time  6    Period  Weeks    Status  On-going      PT LONG TERM GOAL #4   Title  Patient will demonstrate 4/5 or greater left shoulder MMT in all planes to improve stability during functional tasks.    Time  6    Period  Weeks    Status  On-going      PT LONG TERM GOAL #5   Title  Patient will report ability to perform all ADLs independently with less than 4/10 left shoulder pain.    Time  6    Period  Weeks    Status  On-going              Patient will benefit from skilled therapeutic intervention in order to improve the following deficits  and impairments:     Visit Diagnosis: Muscle weakness (generalized)  Stiffness of left shoulder, not elsewhere  classified  Acute pain of left shoulder  Chronic left shoulder pain     Problem List Patient Active Problem List   Diagnosis Date Noted  . Urticaria 09/08/2018  . S/p reverse total shoulder arthroplasty 07/17/2018  . History of colon cancer 06/12/2018  . History of colonic polyps 06/12/2018  . 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  . Gastroesophageal reflux disease 06/12/2018  . Chronic diarrhea 06/26/2017  . Iron deficiency anemia 06/26/2017  . GAD (generalized anxiety disorder) 08/28/2016  . Vitamin D deficiency 08/28/2016  . GERD (gastroesophageal reflux disease) 08/28/2016  . Insomnia 08/28/2016  . Convulsions ([place]) 11/30/2015  . Liver transplant recipient [facility]) 11/30/2015  . Seizures ([place]) 09/27/2015  . Essential hypertension, benign 03/10/2015  . Hx of liver transplant ([place]) 12/08/2014  . Diarrhea 12/08/2014  . Colon cancer ([place]) 12/08/2014  . Hepatic encephalopathy ([place]) 10/26/2013  . Anemia of chronic disease 10/26/2013  . Personal history of colonic polyps 12/27/2012  . PBC (primary biliary cirrhosis) 03/24/2012  . Hypothyroidism 03/24/2012  . Ventral hernia, recurrent 03/24/2012    Gabriela Eves, PT, DPT 10/03/2018, 4:12 PM  [facility] [address] [place], [place], [address] Phone: [phone]   Fax:  [phone]  Name: Cassidy Barber MRN: [id number] Date of Birth: Dec 04, 1950

## 2018-10-06 ENCOUNTER — Ambulatory Visit: Payer: Medicare Other | Admitting: Physical Therapy

## 2018-10-06 DIAGNOSIS — M25512 Pain in left shoulder: Secondary | ICD-10-CM

## 2018-10-06 DIAGNOSIS — M6281 Muscle weakness (generalized): Secondary | ICD-10-CM | POA: Diagnosis not present

## 2018-10-06 DIAGNOSIS — M25612 Stiffness of left shoulder, not elsewhere classified: Secondary | ICD-10-CM | POA: Diagnosis not present

## 2018-10-06 DIAGNOSIS — G8929 Other chronic pain: Secondary | ICD-10-CM | POA: Diagnosis not present

## 2018-10-06 NOTE — Therapy (Signed)
[facility] [place], [place], [address] Phone: [phone]   Fax:  [phone]  Physical Therapy Treatment  Progress Note Reporting Period  09/05/18 to 10/06/18  See note below for Objective Data and Assessment of Progress/Goals.    Patient Details  Name: Cassidy Barber MRN: [id number] Date of Birth: 06-29-1951 Referring Provider (PT): Jenetta Loges, [place]   Encounter Date: 10/06/2018  PT End of Session - 10/06/18 6962    Visit Number  20    Number of Visits  30    Date for PT Re-Evaluation  10/24/18    Authorization Type  FOTO every 5th visit, Progress note every 10th visit    PT Start Time  0900    PT Stop Time  0946    PT Time Calculation (min)  46 min    Activity Tolerance  Patient tolerated treatment well    Behavior During Therapy  [facility] for tasks assessed/performed       Past Medical History:  Diagnosis Date  . Anemia of chronic disease 10/26/2013  . Hernia of unspecified site of abdominal cavity without mention of obstruction or gangrene   . Hypertension   . Liver disease   . Lynch syndrome   . Lynch syndrome   . Primary biliary cirrhosis ([place]) 10/26/2013  . Thyroid disease     Past Surgical History:  Procedure Laterality Date  . ABDOMINAL HERNIA REPAIR     Patient's states that she has had 8- 9 hernia surgeries  . ABDOMINAL HYSTERECTOMY    . CHOLECYSTECTOMY  2007  . COLON RESECTION    . COLON SURGERY  2008   Done at [facility]  . COLONOSCOPY     Done at [facility]  . ESOPHAGOGASTRODUODENOSCOPY N/A 08/21/2018   Procedure: ESOPHAGOGASTRODUODENOSCOPY (EGD);  Surgeon: Rogene Houston, MD;  [facility];  Service: Endoscopy;  Laterality: N/A;  . EYE SURGERY     lasix  . FLEXIBLE SIGMOIDOSCOPY N/A 10/20/2015   Procedure: FLEXIBLE SIGMOIDOSCOPY;  Surgeon: Rogene Houston, MD;  [facility];  Service: Endoscopy;  Laterality: N/A;  80 - Dr Laural Golden has meeting until 1:00  . FLEXIBLE  SIGMOIDOSCOPY N/A 07/11/2016   Procedure: FLEXIBLE SIGMOIDOSCOPY;  Surgeon: Rogene Houston, MD;  [facility];  Service: Endoscopy;  Laterality: N/A;  1200  . FLEXIBLE SIGMOIDOSCOPY N/A 08/09/2017   Procedure: FLEXIBLE SIGMOIDOSCOPY;  Surgeon: Rogene Houston, MD;  [facility];  Service: Endoscopy;  Laterality: N/A;  1:00  . FLEXIBLE SIGMOIDOSCOPY N/A 08/21/2018   Procedure: FLEXIBLE SIGMOIDOSCOPY;  Surgeon: Rogene Houston, MD;  [facility];  Service: Endoscopy;  Laterality: N/A;  . HERNIA REPAIR    . LIVER TRANSPLANT  [id number]  . multiple skin cancers removed    . POLYPECTOMY  08/09/2017   Procedure: POLYPECTOMY;  Surgeon: Rogene Houston, MD;  [facility];  Service: Endoscopy;;  colon small bowel  . REVERSE SHOULDER ARTHROPLASTY Left 07/17/2018  . REVERSE SHOULDER ARTHROPLASTY Left 07/17/2018   Procedure: LEFT REVERSE SHOULDER ARTHROPLASTY;  Surgeon: Justice Britain, MD;  Location: [place];  Service: Orthopedics;  Laterality: Left;  130min  . SPLENECTOMY  2006  . UPPER GASTROINTESTINAL ENDOSCOPY     Done at [facility]    There were no vitals filed for this visit.  Subjective Assessment - 10/06/18 0922    Subjective  Patient reported no new complaints. Patient to see MD next Monday, 10/13/18    Pertinent History  L reverse total shoulder replacement 07/17/18, OA    Limitations  Lifting;House hold activities    Patient Stated Goals  use arm again and return to gardening    Currently in Pain?  No/denies         [facility] PT Assessment - 10/06/18 0001      Assessment   Medical Diagnosis  left reverse total shoulder replacement    Referring Provider (PT)  Jenetta Loges, PA-C    Onset Date/Surgical Date  07/17/18    Hand Dominance  Right    Next MD Visit  October 13, 2018    Prior Therapy  yes      Precautions   Precautions  Shoulder    Type of Shoulder Precautions  see media for protocol      ROM / Strength   AROM / PROM / Strength  PROM;AROM       AROM   AROM Assessment Site  Shoulder    Right/Left Shoulder  Left    Left Shoulder Flexion  50 Degrees    Left Shoulder ABduction  67 Degrees      PROM   PROM Assessment Site  Shoulder    Right/Left Shoulder  Left    Left Shoulder Flexion  135 Degrees    Left Shoulder Internal Rotation  80 Degrees    Left Shoulder External Rotation  82 Degrees                   [facility] Adult PT Treatment/Exercise - 10/06/18 0001      Exercises   Exercises  Shoulder      Shoulder Exercises: Supine   Flexion  AROM;Strengthening;Left;10 reps;20 reps;Weights    Shoulder Flexion Weight (lbs)  1      Shoulder Exercises: Standing   Extension  Strengthening;Left;20 reps    Theraband Level (Shoulder Extension)  Level 1 (Yellow)    Row  Left;Strengthening;20 reps;Theraband    Theraband Level (Shoulder Row)  Level 1 (Yellow)    Other Standing Exercises  IR & ER isometric step outs yellow band 2x10 each; followed by AAROM shoulder adduction      Shoulder Exercises: Pulleys   Flexion  5 minutes      Shoulder Exercises: ROM/Strengthening   UBE (Upper Arm Bike)  90RPM 62min 4 fwd, 4 bwd    Wall Wash  wall wash with elbow flexion followed by flexion wall wash 2 minutes each      Manual Therapy   Manual Therapy  Passive ROM    Passive ROM  PROM of L shoulder into flex, ER, IR at 90 ABD with gentle holds at end range                  PT Long Term Goals - 10/06/18 1024      PT LONG TERM GOAL #1   Title  Patient will be independent with HEP and its progression    Time  6    Period  Weeks    Status  Achieved      PT LONG TERM GOAL #2   Title  Patient will demonstrate 130+ degrees of left shoulder flexion AROM to improve ability to perform functional tasks    Period  Weeks    Status  On-going      PT LONG TERM GOAL #3   Title  Patient will demonstrate 65+ degrees of left ER AROM to don/doff apparel.    Time  6    Period  Weeks  Status  On-going      PT LONG TERM  GOAL #4   Title  Patient will demonstrate 4/5 or greater left shoulder MMT in all planes to improve stability during functional tasks.    Time  6    Period  Weeks    Status  On-going      PT LONG TERM GOAL #5   Title  Patient will report ability to perform all ADLs independently with less than 4/10 left shoulder pain.    Time  6    Period  Weeks    Status  Achieved            Plan - 10/06/18 1019    Clinical Impression Statement  Patient was able to tolerate treatment well but continues to require constant cuing for proper form and technique. Patient noted with improved PROM but is still very limited with left AROM due to lack of strength. Patient opted out of modalities today.     Clinical Presentation  Stable    Clinical Decision Making  Low    Rehab Potential  Good    PT Frequency  3x / week    PT Duration  6 weeks    PT Treatment/Interventions  ADLs/Self Care Home Management;Cryotherapy;Electrical Stimulation;Ultrasound;Moist Heat;Therapeutic activities;Therapeutic exercise;Patient/family education;Manual techniques;Passive range of motion;Dry needling;Vasopneumatic Device;Neuromuscular re-education    PT Next Visit Plan  see protocol, 12 weeks post op 10/09/18; pulleys, isometrics ABD, ER, AAROM flexion PROM flexion to 90-100 degrees, 30 degrees ER in scapular plane, IR at 45 deg in scap plane; modalities PRN    PT Home Exercise Plan  IR and ER isometric step outs    Consulted and Agree with Plan of Care  Patient       Patient will benefit from skilled therapeutic intervention in order to improve the following deficits and impairments:  Decreased activity tolerance, Decreased range of motion, Decreased strength, Pain, Impaired UE functional use  Visit Diagnosis: Muscle weakness (generalized)  Stiffness of left shoulder, not elsewhere classified  Acute pain of left shoulder     Problem List Patient Active Problem List   Diagnosis Date Noted  . Urticaria 09/08/2018   . S/p reverse total shoulder arthroplasty 07/17/2018  . History of colon cancer 06/12/2018  . History of colonic polyps 06/12/2018  . 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  . Gastroesophageal reflux disease 06/12/2018  . Chronic diarrhea 06/26/2017  . Iron deficiency anemia 06/26/2017  . GAD (generalized anxiety disorder) 08/28/2016  . Vitamin D deficiency 08/28/2016  . GERD (gastroesophageal reflux disease) 08/28/2016  . Insomnia 08/28/2016  . Convulsions ([place]) 11/30/2015  . Liver transplant recipient [facility]) 11/30/2015  . Seizures ([place]) 09/27/2015  . Essential hypertension, benign 03/10/2015  . Hx of liver transplant ([place]) 12/08/2014  . Diarrhea 12/08/2014  . Colon cancer ([place]) 12/08/2014  . Hepatic encephalopathy ([place]) 10/26/2013  . Anemia of chronic disease 10/26/2013  . Personal history of colonic polyps 12/27/2012  . PBC (primary biliary cirrhosis) 03/24/2012  . Hypothyroidism 03/24/2012  . Ventral hernia, recurrent 03/24/2012    Gabriela Eves, PT, DPT  10/06/2018, 10:38 AM  [facility] [address] [place], [place], [address] Phone: [phone]   Fax:  [phone]  Name: Cassidy Barber MRN: [id number] Date of Birth: 1951-01-04

## 2018-10-07 DIAGNOSIS — L570 Actinic keratosis: Secondary | ICD-10-CM | POA: Diagnosis not present

## 2018-10-08 ENCOUNTER — Encounter: Payer: Medicare Other | Admitting: Physical Therapy

## 2018-10-13 DIAGNOSIS — Z96612 Presence of left artificial shoulder joint: Secondary | ICD-10-CM | POA: Diagnosis not present

## 2018-10-13 DIAGNOSIS — Z471 Aftercare following joint replacement surgery: Secondary | ICD-10-CM | POA: Diagnosis not present

## 2018-10-15 ENCOUNTER — Encounter: Payer: Medicare Other | Admitting: Physical Therapy

## 2018-10-17 ENCOUNTER — Encounter: Payer: Medicare Other | Admitting: Physical Therapy

## 2018-10-20 ENCOUNTER — Encounter: Payer: Self-pay | Admitting: Physical Therapy

## 2018-10-20 ENCOUNTER — Other Ambulatory Visit: Payer: Medicare Other

## 2018-10-20 ENCOUNTER — Ambulatory Visit: Payer: Medicare Other | Attending: Orthopedic Surgery | Admitting: Physical Therapy

## 2018-10-20 DIAGNOSIS — M6281 Muscle weakness (generalized): Secondary | ICD-10-CM | POA: Diagnosis not present

## 2018-10-20 DIAGNOSIS — Z298 Encounter for other specified prophylactic measures: Secondary | ICD-10-CM

## 2018-10-20 DIAGNOSIS — M25612 Stiffness of left shoulder, not elsewhere classified: Secondary | ICD-10-CM

## 2018-10-20 DIAGNOSIS — M25512 Pain in left shoulder: Secondary | ICD-10-CM | POA: Diagnosis not present

## 2018-10-20 NOTE — Therapy (Signed)
[facility] [place], [place], [address] Phone: [phone]   Fax:  [phone]  Physical Therapy Treatment  Patient Details  Name: Cassidy Barber MRN: [id number] Date of Birth: 14-Jan-1951 Referring Provider (PT): Jenetta Loges, [place]   Encounter Date: 10/20/2018  PT End of Session - 10/20/18 0902    Visit Number  21    Number of Visits  30    Date for PT Re-Evaluation  10/24/18    Authorization Type  FOTO every 5th visit, Progress note every 10th visit    PT Start Time  0900    PT Stop Time  0946    PT Time Calculation (min)  46 min    Activity Tolerance  Patient tolerated treatment well    Behavior During Therapy  [facility] for tasks assessed/performed       Past Medical History:  Diagnosis Date  . Anemia of chronic disease 10/26/2013  . Hernia of unspecified site of abdominal cavity without mention of obstruction or gangrene   . Hypertension   . Liver disease   . Lynch syndrome   . Lynch syndrome   . Primary biliary cirrhosis ([place]) 10/26/2013  . Thyroid disease     Past Surgical History:  Procedure Laterality Date  . ABDOMINAL HERNIA REPAIR     Patient's states that she has had 8- 9 hernia surgeries  . ABDOMINAL HYSTERECTOMY    . CHOLECYSTECTOMY  2007  . COLON RESECTION    . COLON SURGERY  2008   Done at [facility]  . COLONOSCOPY     Done at [facility]  . ESOPHAGOGASTRODUODENOSCOPY N/A 08/21/2018   Procedure: ESOPHAGOGASTRODUODENOSCOPY (EGD);  Surgeon: Rogene Houston, MD;  [facility];  Service: Endoscopy;  Laterality: N/A;  . EYE SURGERY     lasix  . FLEXIBLE SIGMOIDOSCOPY N/A 10/20/2015   Procedure: FLEXIBLE SIGMOIDOSCOPY;  Surgeon: Rogene Houston, MD;  [facility];  Service: Endoscopy;  Laterality: N/A;  18 - Dr Laural Golden has meeting until 1:00  . FLEXIBLE SIGMOIDOSCOPY N/A 07/11/2016   Procedure: FLEXIBLE SIGMOIDOSCOPY;  Surgeon: Rogene Houston, MD;  [facility];  Service: Endoscopy;   Laterality: N/A;  1200  . FLEXIBLE SIGMOIDOSCOPY N/A 08/09/2017   Procedure: FLEXIBLE SIGMOIDOSCOPY;  Surgeon: Rogene Houston, MD;  [facility];  Service: Endoscopy;  Laterality: N/A;  1:00  . FLEXIBLE SIGMOIDOSCOPY N/A 08/21/2018   Procedure: FLEXIBLE SIGMOIDOSCOPY;  Surgeon: Rogene Houston, MD;  [facility];  Service: Endoscopy;  Laterality: N/A;  . HERNIA REPAIR    . LIVER TRANSPLANT  [id number]  . multiple skin cancers removed    . POLYPECTOMY  08/09/2017   Procedure: POLYPECTOMY;  Surgeon: Rogene Houston, MD;  [facility];  Service: Endoscopy;;  colon small bowel  . REVERSE SHOULDER ARTHROPLASTY Left 07/17/2018  . REVERSE SHOULDER ARTHROPLASTY Left 07/17/2018   Procedure: LEFT REVERSE SHOULDER ARTHROPLASTY;  Surgeon: Justice Britain, MD;  Location: [place];  Service: Orthopedics;  Laterality: Left;  179min  . SPLENECTOMY  2006  . UPPER GASTROINTESTINAL ENDOSCOPY     Done at [facility]    There were no vitals filed for this visit.  Subjective Assessment - 10/20/18 0903    Subjective  Reports feeling stiff but no new complaints    Pertinent History  L reverse total shoulder replacement 07/17/18, OA    Limitations  Lifting;House hold activities    Patient Stated Goals  use arm again and  return to gardening    Currently in Pain?  No/denies         [facility] PT Assessment - 10/20/18 0001      Assessment   Medical Diagnosis  left reverse total shoulder replacement    Referring Provider (PT)  Jenetta Loges, PA-C    Onset Date/Surgical Date  07/17/18    Hand Dominance  Right    Next MD Visit  March    Prior Therapy  yes      Precautions   Precautions  Shoulder    Type of Shoulder Precautions  see media for protocol                   [facility] Adult PT Treatment/Exercise - 10/20/18 0001      Exercises   Exercises  Shoulder      Shoulder Exercises: Standing   Internal Rotation  Strengthening;Left;Theraband   2 minutes   Theraband Level  (Shoulder Internal Rotation)  Level 1 (Yellow)    Flexion  20 reps    Row  Left;Strengthening;20 reps;Theraband    Theraband Level (Shoulder Row)  Level 2 (Red)    Other Standing Exercises  Wall ladder x3 minutes to level 25    Other Standing Exercises  ER isometric step outs yellow band 2 minutes, functional flexion cone to first shelf x30      Shoulder Exercises: Pulleys   Flexion  5 minutes      Shoulder Exercises: ROM/Strengthening   UBE (Upper Arm Bike)  90RPM 23min 5 fwd, 5 bwd    Wall Wash  wall wash with elbow flexion followed by flexion wall wash 2 minutes each                  PT Long Term Goals - 10/06/18 1024      PT LONG TERM GOAL #1   Title  Patient will be independent with HEP and its progression    Time  6    Period  Weeks    Status  Achieved      PT LONG TERM GOAL #2   Title  Patient will demonstrate 130+ degrees of left shoulder flexion AROM to improve ability to perform functional tasks    Period  Weeks    Status  On-going      PT LONG TERM GOAL #3   Title  Patient will demonstrate 65+ degrees of left ER AROM to don/doff apparel.    Time  6    Period  Weeks    Status  On-going      PT LONG TERM GOAL #4   Title  Patient will demonstrate 4/5 or greater left shoulder MMT in all planes to improve stability during functional tasks.    Time  6    Period  Weeks    Status  On-going      PT LONG TERM GOAL #5   Title  Patient will report ability to perform all ADLs independently with less than 4/10 left shoulder pain.    Time  6    Period  Weeks    Status  Achieved            Plan - 10/20/18 1034    Clinical Impression Statement  Patient was able to tolerate treatment well despite reports of muscle fatigue. Patient continues to use unaffected arm to help with the initiation of movment despite constant cuing to allow left shoulder to perform initiation. Patient also cued for proper form as she  continues to use momentum to intiate movement.  Patient educated she is not helping the arm build muscles in the area it needs if she continues to help with AAROM or momentum. Patient reported understanding.     Clinical Presentation  Stable    Clinical Decision Making  Low    Rehab Potential  Good    PT Frequency  3x / week    PT Duration  6 weeks    PT Treatment/Interventions  ADLs/Self Care Home Management;Moist Heat;Electrical Stimulation;Cryotherapy;Patient/family education;Therapeutic exercise;Therapeutic activities;Neuromuscular re-education;Passive range of motion;Manual techniques;Vasopneumatic Device;Dry needling;Ultrasound    PT Next Visit Plan  see protocol, 12 weeks post op 10/09/18; pulleys, isometrics ABD, ER, AAROM flexion PROM flexion to 90-100 degrees, 30 degrees ER in scapular plane, IR at 45 deg in scap plane; modalities PRN    Consulted and Agree with Plan of Care  Patient       Patient will benefit from skilled therapeutic intervention in order to improve the following deficits and impairments:  Decreased activity tolerance, Decreased range of motion, Decreased strength, Pain, Impaired UE functional use  Visit Diagnosis: Muscle weakness (generalized)  Stiffness of left shoulder, not elsewhere classified  Acute pain of left shoulder     Problem List Patient Active Problem List   Diagnosis Date Noted  . Urticaria 09/08/2018  . S/p reverse total shoulder arthroplasty 07/17/2018  . History of colon cancer 06/12/2018  . History of colonic polyps 06/12/2018  . 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  . Gastroesophageal reflux disease 06/12/2018  . Chronic diarrhea 06/26/2017  . Iron deficiency anemia 06/26/2017  . GAD (generalized anxiety disorder) 08/28/2016  . Vitamin D deficiency 08/28/2016  . GERD (gastroesophageal reflux disease) 08/28/2016  . Insomnia 08/28/2016  . Convulsions ([place]) 11/30/2015  . Liver transplant recipient [facility]) 11/30/2015  . Seizures ([place]) 09/27/2015  . Essential hypertension, benign 03/10/2015   . Hx of liver transplant ([place]) 12/08/2014  . Diarrhea 12/08/2014  . Colon cancer ([place]) 12/08/2014  . Hepatic encephalopathy ([place]) 10/26/2013  . Anemia of chronic disease 10/26/2013  . Personal history of colonic polyps 12/27/2012  . PBC (primary biliary cirrhosis) 03/24/2012  . Hypothyroidism 03/24/2012  . Ventral hernia, recurrent 03/24/2012   Gabriela Eves, PT, DPT 10/20/2018, 11:28 AM  [facility] [address] [place], [place], [address] Phone: [phone]   Fax:  [phone]  Name: MARY-ANN PENNELLA MRN: [id number] Date of Birth: December 23, 1950

## 2018-10-21 ENCOUNTER — Encounter: Payer: Self-pay | Admitting: Allergy and Immunology

## 2018-10-21 ENCOUNTER — Ambulatory Visit (INDEPENDENT_AMBULATORY_CARE_PROVIDER_SITE_OTHER): Payer: Medicare Other | Admitting: Allergy and Immunology

## 2018-10-21 VITALS — BP 120/80 | HR 90 | Temp 97.8°F | Resp 20 | Ht 62.5 in | Wt 146.4 lb

## 2018-10-21 DIAGNOSIS — L501 Idiopathic urticaria: Secondary | ICD-10-CM | POA: Diagnosis not present

## 2018-10-21 DIAGNOSIS — T782XXD Anaphylactic shock, unspecified, subsequent encounter: Secondary | ICD-10-CM

## 2018-10-21 MED ORDER — EPINEPHRINE 0.3 MG/0.3ML IJ SOAJ: INTRAMUSCULAR | 2 refills | Status: DC

## 2018-10-21 MED ORDER — MONTELUKAST SODIUM 10 MG PO TABS: 10.0000 mg | ORAL_TABLET | Freq: Every day | ORAL | 5 refills | Status: DC

## 2018-10-21 MED ORDER — FAMOTIDINE 20 MG PO TABS: ORAL_TABLET | ORAL | 5 refills | Status: DC

## 2018-10-21 MED ORDER — CETIRIZINE HCL 10 MG PO TABS: ORAL_TABLET | ORAL | 5 refills | Status: DC

## 2018-10-21 NOTE — Patient Instructions (Addendum)
  1.  Use cetirizine 10 mg -1-2 tablets 2 times a day (maximum = 40mg )  2.  Use famotidine 20 mg -1 tablet 2 times a day  3.  Use montelukast 10 mg -1 tablet 1 time a day  4.  May add in Benadryl as needed  5.  Blood -tryptase  6.  EpiPen, Benadryl, MD/ER evaluation for severe allergic reaction  7.  Contact clinic Thursday in regard to response to plan  8.  Further evaluation and treatment?

## 2018-10-21 NOTE — Progress Notes (Signed)
Dear Cassidy Barber,  Thank you for referring Cassidy Barber to the [facility] on 10/21/2018.   Below is a summation of this patient's evaluation and recommendations.  Thank you for your referral. I will keep you informed about this patient's response to treatment.   If you have any questions please do not hesitate to contact me.   Sincerely,  Jiles Prows, MD Allergy / Immunology [place]   ______________________________________________________________________    NEW PATIENT NOTE  Referring Provider: Starlyn Skeans Primary Provider: [name], FNP Date of office visit: 10/21/2018    Subjective:   Chief Complaint:  Cassidy Barber (DOB: 1950-12-22) is a 67 y.o. female who presents to the clinic on 10/21/2018 with a chief complaint of Urticaria; Rash; and Pruritis .     HPI: Cassidy Barber presents to this clinic in evaluation of her urticaria.  Somewhere in the early part of the summer she developed red raised itchy lesions with a global distribution that individually would last less then 1 day and never heal with scar or hyperpigmentation and were not associated with any systemic or constitutional symptoms other than the fact that when she develops a very severe flare she will develop a "dry spot" in her throat and she loses her voice.  She has been treated with 2 courses of systemic steroids which does help this issue but then she quickly relapses.  Recently she has seen her dermatologist who has placed her on cetirizine and Claritin and Benadryl which definitely helps her itchiness and as long as she consistently uses these medications she does not develop her associated throat issue.  There is not really an obvious provoking factor giving rise to this issue.  She has not started any type of new diet or supplements and her environment has not changed and she is not on any  new medications.  She does have a very complex history including primary biliary cirrhosis requiring liver transplant in 2015 and chronic immunosuppression with a calcineurin inhibitor.  She does not really have a history of other atopic disease.  She has had a thorough evaluation including multiple blood tests for this issue.  Past Medical History:  Diagnosis Date  . Anemia of chronic disease 10/26/2013  . Hernia of unspecified site of abdominal cavity without mention of obstruction or gangrene   . Hypertension   . Liver disease   . Lynch syndrome   . Lynch syndrome   . Primary biliary cirrhosis ([place]) 10/26/2013  . Thyroid disease     Past Surgical History:  Procedure Laterality Date  . ABDOMINAL HERNIA REPAIR     Patient's states that she has had 8- 9 hernia surgeries  . ABDOMINAL HYSTERECTOMY    . CHOLECYSTECTOMY  2007  . COLON RESECTION    . COLON SURGERY  2008   [facility] at [facility]  . COLONOSCOPY     Done at [facility]  . ESOPHAGOGASTRODUODENOSCOPY N/A 08/21/2018   Procedure: ESOPHAGOGASTRODUODENOSCOPY (EGD);  Surgeon: Rogene Houston, MD;  [facility];  Service: Endoscopy;  Laterality: N/A;  . EYE SURGERY     lasix  . FLEXIBLE SIGMOIDOSCOPY N/A 10/20/2015   Procedure: FLEXIBLE SIGMOIDOSCOPY;  Surgeon: Rogene Houston, MD;  [facility];  Service: Endoscopy;  Laterality: N/A;  4 - Dr Laural Golden has meeting until 1:00  . FLEXIBLE SIGMOIDOSCOPY N/A 07/11/2016   Procedure: FLEXIBLE SIGMOIDOSCOPY;  Surgeon: Rogene Houston, MD;  [facility];  Service: Endoscopy;  Laterality: N/A;  1200  . FLEXIBLE SIGMOIDOSCOPY N/A 08/09/2017   Procedure: FLEXIBLE SIGMOIDOSCOPY;  Surgeon: Rogene Houston, MD;  [facility];  Service: Endoscopy;  Laterality: N/A;  1:00  . FLEXIBLE SIGMOIDOSCOPY N/A 08/21/2018   Procedure: FLEXIBLE SIGMOIDOSCOPY;  Surgeon: Rogene Houston, MD;  [facility];  Service: Endoscopy;  Laterality: N/A;  . HERNIA REPAIR    . LIVER  TRANSPLANT  [id number]  . multiple skin cancers removed    . POLYPECTOMY  08/09/2017   Procedure: POLYPECTOMY;  Surgeon: Rogene Houston, MD;  [facility];  Service: Endoscopy;;  colon small bowel  . REVERSE SHOULDER ARTHROPLASTY Left 07/17/2018  . REVERSE SHOULDER ARTHROPLASTY Left 07/17/2018   Procedure: LEFT REVERSE SHOULDER ARTHROPLASTY;  Surgeon: Justice Britain, MD;  Location: [facility];  Service: Orthopedics;  Laterality: Left;  149min  . SPLENECTOMY  2006  . TYMPANOSTOMY TUBE PLACEMENT    . UPPER GASTROINTESTINAL ENDOSCOPY     Done at [facility]    Allergies as of 10/21/2018      Reactions   Ciprofloxacin Itching   Codeine Nausea Only      Medication List      acetaminophen 500 MG tablet Commonly known as:  TYLENOL Take 1,000 mg by mouth 2 (two) times daily as needed for moderate pain or headache.   alendronate 70 MG tablet Commonly known as:  FOSAMAX TAKE 1 TABLET BY MOUTH ONCE A WEEK WITH A FULL GLASS OF WATER ON AN EMPTY STOMACH   ALPRAZolam 0.5 MG tablet Commonly known as:  XANAX Take 1 tablet (0.5 mg total) by mouth at bedtime as needed.   aspirin EC 81 MG tablet Take 81 mg by mouth daily.   Biotin 10000 MCG Tabs Take 10,000 mcg by mouth daily.   CALCIUM CITRATE PO Take 1,200 mg by mouth 2 (two) times daily.   cetirizine 10 MG tablet Commonly known as:  ZYRTEC Take 0.5 tablets (5 mg total) by mouth at bedtime.   cyclobenzaprine 5 MG tablet Commonly known as:  FLEXERIL Take 1 tablet (5 mg total) by mouth 3 (three) times daily as needed for muscle spasms.   everolimus 5 MG tablet Commonly known as:  AFINITOR Take 12.5 mg by mouth 2 (two) times daily.   ferrous sulfate 325 (65 FE) MG tablet Take 325 mg by mouth 2 (two) times daily with a meal.   fluorouracil 5 % cream Commonly known as:  EFUDEX Apply 1 application topically daily as needed (cancer spots).   fluticasone 50 MCG/ACT nasal spray Commonly known as:  FLONASE Place 2 sprays into both  nostrils daily.   HYDROcodone-acetaminophen 5-325 MG tablet Commonly known as:  NORCO/VICODIN Take 1 tablet by mouth 3 (three) times daily as needed for moderate pain.   hydrocortisone cream 1 % Apply 1 application topically daily as needed for itching.   hydrOXYzine 25 MG tablet Commonly known as:  ATARAX/VISTARIL TAKE 1 TABLET THREE TIMES DAILY AS NEEDED FOR ITCHING   levothyroxine 125 MCG tablet Commonly known as:  SYNTHROID, LEVOTHROID Take 1 tablet (125 mcg total) by mouth daily.   loperamide 2 MG capsule Commonly known as:  IMODIUM TAKE 2 CAPSULES 4 TIMES DAILY AS NEEDED   loratadine 10 MG tablet Commonly known as:  CLARITIN Take 1 tablet (10 mg total) by mouth daily.   losartan 25 MG tablet Commonly known as:  COZAAR Take 1  tablet (25 mg total) by mouth daily.   magnesium oxide 400 MG tablet Commonly known as:  MAG-OX Take 400 mg by mouth daily.   metoprolol succinate 25 MG 24 hr tablet Commonly known as:  TOPROL-XL TAKE 1 TABLET BY MOUTH ONCE DAILY   ondansetron 4 MG tablet Commonly known as:  ZOFRAN Take 4 mg by mouth every 8 (eight) hours as needed for nausea or vomiting.   pantoprazole 40 MG tablet Commonly known as:  PROTONIX Take 1 tablet (40 mg total) by mouth daily before breakfast.   propranolol 80 MG tablet Commonly known as:  INDERAL Take 80 mg by mouth daily.   ranitidine 150 MG tablet Commonly known as:  ZANTAC Take 1 tablet (150 mg total) by mouth 2 (two) times daily.   ursodiol 250 MG tablet Commonly known as:  ACTIGALL Take 300 mg by mouth 2 (two) times daily.   vitamin B-12 1000 MCG tablet Commonly known as:  CYANOCOBALAMIN Take 1,000 mcg by mouth daily.   Vitamin D (Ergocalciferol) 1.25 MG (50000 UT) Caps capsule Commonly known as:  DRISDOL Take 1 capsule (50,000 Units total) by mouth once a week.   vitamin E 400 UNIT capsule Generic drug:  vitamin E Take 400 Units by mouth daily.       Review of systems negative except  as noted in HPI / PMHx or noted below:  Review of Systems  Constitutional: Negative.   HENT: Negative.   Eyes: Negative.   Respiratory: Negative.   Cardiovascular: Negative.   Gastrointestinal: Negative.   Genitourinary: Negative.   Musculoskeletal: Negative.   Skin: Negative.   Neurological: Negative.   Endo/Heme/Allergies: Negative.   Psychiatric/Behavioral: Negative.     Family History  Problem Relation Age of Onset  . Prostate cancer Father   . Colon cancer Father   . Colon cancer Sister   . Lung cancer Sister   . Healthy Son   . Alcohol abuse Brother   . Allergic rhinitis Neg Hx   . Asthma Neg Hx   . Eczema Neg Hx   . Urticaria Neg Hx     Social History   Socioeconomic History  . Marital status: Married    Spouse name: Not on file  . Number of children: 1  . Years of education: Not on file  . Highest education level: 12th grade  Occupational History  . Occupation: Disability    Employer: [employer]    Comment: Multimedia programmer  Social Needs  . Financial resource strain: Not hard at all  . Food insecurity:    Worry: Never true    Inability: Never true  . Transportation needs:    Medical: No    Non-medical: No  Tobacco Use  . Smoking status: Never Smoker  . Smokeless tobacco: Never Used  Substance and Sexual Activity  . Alcohol use: No    Alcohol/week: 0.0 standard drinks  . Drug use: No  . Sexual activity: Yes    Birth control/protection: None  Lifestyle  . Physical activity:    Days per week: 3 days    Minutes per session: 60 min  . Stress: Not at all  Relationships  . Social connections:    Talks on phone: More than three times a week    Gets together: More than three times a week    Attends religious service: More than 4 times per year    Active member of club or organization: Yes    Attends meetings of clubs or  organizations: More than 4 times per year    Relationship status: Married  . Intimate partner violence:    Fear of  current or ex partner: No    Emotionally abused: No    Physically abused: No    Forced sexual activity: No  Other Topics Concern  . Not on file  Social History Narrative   Patient lives in a two story home with her husband. She has an adult son. She is retired from being an Web designer for 30 years.     Environmental and Social history  Lives in a house with a dry environment, no animals located inside the household, no carpet in the bedroom, no plastic on the bed, no plastic on the pillow, no smokers located inside the household.  She is retired.  Objective:   Vitals:   10/21/18 1327  BP: 120/80  Pulse: 90  Resp: 20  Temp: 97.8 F (36.6 C)  SpO2: 95%   Height: 5' 2.5" (158.8 cm) Weight: 146 lb 6.4 oz (66.4 kg)  Physical Exam  HENT:  Head: Normocephalic. Head is without right periorbital erythema and without left periorbital erythema.  Right Ear: Tympanic membrane, external ear and ear canal normal.  Left Ear: Tympanic membrane, external ear and ear canal normal.  Nose: Nose normal. No mucosal edema or rhinorrhea.  Mouth/Throat: Uvula is midline, oropharynx is clear and moist and mucous membranes are normal. No oropharyngeal exudate.  Eyes: Pupils are equal, round, and reactive to light. Conjunctivae and lids are normal.  Neck: Trachea normal. No tracheal tenderness present. No tracheal deviation present. No thyromegaly present.  Cardiovascular: Normal rate, regular rhythm, S1 normal, S2 normal and normal heart sounds.  No murmur heard. Pulmonary/Chest: Effort normal and breath sounds normal. No stridor. No respiratory distress. She has no wheezes. She has no rales. She exhibits no tenderness.  Abdominal: Soft. She exhibits no distension and no mass. There is no hepatosplenomegaly. There is no tenderness. There is no rebound and no guarding.  Musculoskeletal: She exhibits no edema or tenderness.  Lymphadenopathy:       Head (right side): No tonsillar adenopathy  present.       Head (left side): No tonsillar adenopathy present.    She has no cervical adenopathy.    She has no axillary adenopathy.  Neurological: She is alert.  Skin: No rash (Diffuse blotchy somewhat raised erythematous lesions with global distribution involving trunk and extremities) noted. She is not diaphoretic. No erythema. No pallor. Nails show no clubbing.    Diagnostics: Allergy skin tests were not performed.   Results of blood tests obtained 20 October 2018 identified creatinine 0.64 mg/DL, calcium 8.3 mg/DL, alkaline phosphatase 118 IU/L, AST 17 IU/L, ALT 16 IU/L, WBC 5.4, absolute eosinophil 200, absolute lymphocyte 2000, hemoglobin 11.1, platelet 306  Results of blood tests obtained 20 August 2018 identified negative alpha gal panel  Results of blood tests obtained 26 May 2018 identifies TSH 0.324 IU/mL, T4 8.9 UG/DL, and on 13 June 2018 her TSH was 1.170 IU/mL  Results of a chest x-ray obtained 04 Apr 2018 identified the following:  1. Mixed ground-glass and alveolar opacities in the medial segment of right middle lobe. This in conjunction with history of vomiting and fluid/debris within the visualized esophagus may be secondary to aspiration. Follow-up to ensure clearance is suggested. 2. Coronary arteriosclerosis. 3. Mild aortic atherosclerosis.  Assessment and Plan:    1. Idiopathic urticaria   2. Anaphylaxis, subsequent encounter     1.  Use cetirizine 10 mg -1-2 tablets 2 times a day (maximum = 40mg )  2.  Use famotidine 20 mg -1 tablet 2 times a day  3.  Use montelukast 10 mg -1 tablet 1 time a day  4.  May add in Benadryl as needed  5.  Blood -tryptase  6.  EpiPen, Benadryl, MD/ER evaluation for severe allergic reaction  7.  Contact clinic Thursday in regard to response to plan  8.  Further evaluation and treatment?  Cassidy Barber certainly has immunological hyperreactivity of unknown etiologic factor.  She has had a very good evaluation for systemic  disease contributing to this issue and we will complete her analysis by checking a serum tryptase to see if she has an expanded mast cell population.  I will have her utilize the therapy noted above and make a determination about further evaluation and treatment based upon her response.  She did have an abnormal chest x-ray in May 2019 although at this point is not with any significant respiratory tract symptoms.  She does appear to occasionally develop significant laryngeal swelling associated with a bad flare of hives and for that reason I have given her an injectable epinephrine device in case she ever develops a severe reaction.  Further evaluation and treatment will be based upon her response to this approach.  Jiles Prows, MD Allergy / Immunology [facility]

## 2018-10-22 ENCOUNTER — Encounter: Payer: Self-pay | Admitting: Allergy and Immunology

## 2018-10-22 ENCOUNTER — Encounter: Payer: Self-pay | Admitting: Physical Therapy

## 2018-10-22 ENCOUNTER — Ambulatory Visit: Payer: Medicare Other | Admitting: Physical Therapy

## 2018-10-22 DIAGNOSIS — M25512 Pain in left shoulder: Secondary | ICD-10-CM | POA: Diagnosis not present

## 2018-10-22 DIAGNOSIS — M25612 Stiffness of left shoulder, not elsewhere classified: Secondary | ICD-10-CM

## 2018-10-22 DIAGNOSIS — M6281 Muscle weakness (generalized): Secondary | ICD-10-CM | POA: Diagnosis not present

## 2018-10-22 LAB — CMP14+EGFR
ALT: 16 IU/L (ref 0–32)
AST: 17 IU/L (ref 0–40)
Albumin/Globulin Ratio: 1.6 (ref 1.2–2.2)
Albumin: 3.7 g/dL (ref 3.6–4.8)
Alkaline Phosphatase: 118 IU/L — ABNORMAL HIGH (ref 39–117)
BUN/Creatinine Ratio: 27 (ref 12–28)
BUN: 17 mg/dL (ref 8–27)
CO2: 20 mmol/L (ref 20–29)
CREATININE: 0.64 mg/dL (ref 0.57–1.00)
Calcium: 8.3 mg/dL — ABNORMAL LOW (ref 8.7–10.3)
Chloride: 104 mmol/L (ref 96–106)
GFR calc non Af Amer: 93 mL/min/{1.73_m2} (ref >59)
GFR, EST AFRICAN AMERICAN: 107 mL/min/{1.73_m2} (ref >59)
GLUCOSE: 86 mg/dL (ref 65–99)
Globulin, Total: 2.3 g/dL (ref 1.5–4.5)
Potassium: 4.1 mmol/L (ref 3.5–5.2)
Sodium: 140 mmol/L (ref 134–144)
TOTAL PROTEIN: 6 g/dL (ref 6.0–8.5)

## 2018-10-22 LAB — CBC WITH DIFFERENTIAL/PLATELET
BASOS ABS: 0.1 10*3/uL (ref 0.0–0.2)
Basos: 1 %
EOS (ABSOLUTE): 0.2 10*3/uL (ref 0.0–0.4)
Eos: 4 %
Hematocrit: 34.9 % (ref 34.0–46.6)
Hemoglobin: 11.1 g/dL (ref 11.1–15.9)
IMMATURE GRANS (ABS): 0 10*3/uL (ref 0.0–0.1)
Immature Granulocytes: 0 %
LYMPHS: 38 %
Lymphocytes Absolute: 2 10*3/uL (ref 0.7–3.1)
MCH: 27.7 pg (ref 26.6–33.0)
MCHC: 31.8 g/dL (ref 31.5–35.7)
MCV: 87 fL (ref 79–97)
MONOCYTES: 17 %
Monocytes Absolute: 0.9 10*3/uL (ref 0.1–0.9)
NEUTROS ABS: 2.2 10*3/uL (ref 1.4–7.0)
Neutrophils: 40 %
Platelets: 306 10*3/uL (ref 150–450)
RBC: 4.01 x10E6/uL (ref 3.77–5.28)
RDW: 16.9 % — ABNORMAL HIGH (ref 12.3–15.4)
WBC: 5.4 10*3/uL (ref 3.4–10.8)

## 2018-10-22 LAB — EVEROLIMUS: Everolimus: 7.5 ng/mL (ref 3.0–8.0)

## 2018-10-22 LAB — MAGNESIUM: Magnesium: 1.9 mg/dL (ref 1.6–2.3)

## 2018-10-22 NOTE — Therapy (Signed)
[facility] [place], [place], [address] Phone: [phone]   Fax:  [phone]  Physical Therapy Treatment  Patient Details  Name: Cassidy Barber MRN: [id number] Date of Birth: May 02, 1951 Referring Provider (PT): Jenetta Loges, [place]   Encounter Date: 10/22/2018  PT End of Session - 10/22/18 0912    Visit Number  22    Number of Visits  42    Date for PT Re-Evaluation  11/21/18   Per new order, 10/22/18   Authorization Type  FOTO every 5th visit, Progress note every 10th visit    PT Start Time  0900    PT Stop Time  0958    PT Time Calculation (min)  58 min    Activity Tolerance  Patient tolerated treatment well    Behavior During Therapy  [facility] for tasks assessed/performed       Past Medical History:  Diagnosis Date  . Anemia of chronic disease 10/26/2013  . Hernia of unspecified site of abdominal cavity without mention of obstruction or gangrene   . Hypertension   . Liver disease   . Lynch syndrome   . Lynch syndrome   . Primary biliary cirrhosis ([place]) 10/26/2013  . Thyroid disease     Past Surgical History:  Procedure Laterality Date  . ABDOMINAL HERNIA REPAIR     Patient's states that she has had 8- 9 hernia surgeries  . ABDOMINAL HYSTERECTOMY    . CHOLECYSTECTOMY  2007  . COLON RESECTION    . COLON SURGERY  2008   Done at [facility]  . COLONOSCOPY     Done at [facility]  . ESOPHAGOGASTRODUODENOSCOPY N/A 08/21/2018   Procedure: ESOPHAGOGASTRODUODENOSCOPY (EGD);  Surgeon: Rogene Houston, MD;  [facility];  Service: Endoscopy;  Laterality: N/A;  . EYE SURGERY     lasix  . FLEXIBLE SIGMOIDOSCOPY N/A 10/20/2015   Procedure: FLEXIBLE SIGMOIDOSCOPY;  Surgeon: Rogene Houston, MD;  [facility];  Service: Endoscopy;  Laterality: N/A;  14 - Dr Laural Golden has meeting until 1:00  . FLEXIBLE SIGMOIDOSCOPY N/A 07/11/2016   Procedure: FLEXIBLE SIGMOIDOSCOPY;  Surgeon: Rogene Houston, MD;  [facility];  Service: Endoscopy;  Laterality: N/A;  1200  . FLEXIBLE SIGMOIDOSCOPY N/A 08/09/2017   Procedure: FLEXIBLE SIGMOIDOSCOPY;  Surgeon: Rogene Houston, MD;  [facility];  Service: Endoscopy;  Laterality: N/A;  1:00  . FLEXIBLE SIGMOIDOSCOPY N/A 08/21/2018   Procedure: FLEXIBLE SIGMOIDOSCOPY;  Surgeon: Rogene Houston, MD;  [facility];  Service: Endoscopy;  Laterality: N/A;  . HERNIA REPAIR    . LIVER TRANSPLANT  [id number]  . multiple skin cancers removed    . POLYPECTOMY  08/09/2017   Procedure: POLYPECTOMY;  Surgeon: Rogene Houston, MD;  [facility];  Service: Endoscopy;;  colon small bowel  . REVERSE SHOULDER ARTHROPLASTY Left 07/17/2018  . REVERSE SHOULDER ARTHROPLASTY Left 07/17/2018   Procedure: LEFT REVERSE SHOULDER ARTHROPLASTY;  Surgeon: Justice Britain, MD;  Location: [facility];  Service: Orthopedics;  Laterality: Left;  141min  . SPLENECTOMY  2006  . TYMPANOSTOMY TUBE PLACEMENT    . UPPER GASTROINTESTINAL ENDOSCOPY     Done at [facility]    There were no vitals filed for this visit.  Subjective Assessment - 10/22/18 0905    Subjective  Patient reported feeling sore after last visit. She has been compliant with HEP.    Pertinent History  L reverse total shoulder replacement 07/17/18, OA  Limitations  Lifting;House hold activities    Patient Stated Goals  use arm again and return to gardening    Currently in Pain?  No/denies    Pain Score  6    level of soreness   Pain Location  Shoulder    Pain Orientation  Left    Pain Descriptors / Indicators  Sore    Pain Type  Surgical pain         [facility] PT Assessment - 10/22/18 0001      Assessment   Medical Diagnosis  left reverse total shoulder replacement    Referring Provider (PT)  Jenetta Loges, PA-C    Onset Date/Surgical Date  07/17/18    Hand Dominance  Right    Next MD Visit  March    Prior Therapy  yes      Precautions   Precautions  Shoulder    Type of Shoulder Precautions  see  media for protocol                   [facility] Adult PT Treatment/Exercise - 10/22/18 0001      Exercises   Exercises  Shoulder      Shoulder Exercises: Standing   Internal Rotation  Strengthening;Left;Theraband   2 minutes   Theraband Level (Shoulder Internal Rotation)  Level 1 (Yellow)    Extension  Strengthening;Left;20 reps    Theraband Level (Shoulder Extension)  Level 2 (Red)    Row  Left;Strengthening;20 reps;Theraband    Theraband Level (Shoulder Row)  Level 2 (Red)    Other Standing Exercises  ER isometric step outs yellow band 2 minutes, functional flexion cone to first shelf x30, bicep curls 2# x2 minutes      Shoulder Exercises: Pulleys   Flexion  5 minutes      Shoulder Exercises: ROM/Strengthening   UBE (Upper Arm Bike)  60 RPM 10 min 5 fwd, 5 bwd      Vasopneumatic   Number Minutes Vasopneumatic   15 minutes    Vasopnuematic Location   Shoulder    Vasopneumatic Pressure  Low    Vasopneumatic Temperature   34                  PT Long Term Goals - 10/06/18 1024      PT LONG TERM GOAL #1   Title  Patient will be independent with HEP and its progression    Time  6    Period  Weeks    Status  Achieved      PT LONG TERM GOAL #2   Title  Patient will demonstrate 130+ degrees of left shoulder flexion AROM to improve ability to perform functional tasks    Period  Weeks    Status  On-going      PT LONG TERM GOAL #3   Title  Patient will demonstrate 65+ degrees of left ER AROM to don/doff apparel.    Time  6    Period  Weeks    Status  On-going      PT LONG TERM GOAL #4   Title  Patient will demonstrate 4/5 or greater left shoulder MMT in all planes to improve stability during functional tasks.    Time  6    Period  Weeks    Status  On-going      PT LONG TERM GOAL #5   Title  Patient will report ability to perform all ADLs independently with less than 4/10 left shoulder pain.  Time  6    Period  Weeks    Status  Achieved             Plan - 10/22/18 0943    Clinical Impression Statement  Patient was able to tolerate treatment well but still continues to report muscle fatigue. Patient noted with improvements with functional flexion exercise. She was able to initiate movment without unaffected arm assist and minimal trunk compensation. Patient instructed to continue exercises at home. Patient reported agreement. Patient requested vaso today due to increased soreness.     Clinical Presentation  Stable    Clinical Decision Making  Low    Rehab Potential  Good    PT Frequency  3x / week    PT Duration  6 weeks    PT Treatment/Interventions  ADLs/Self Care Home Management;Moist Heat;Electrical Stimulation;Cryotherapy;Patient/family education;Therapeutic exercise;Therapeutic activities;Neuromuscular re-education;Passive range of motion;Manual techniques;Vasopneumatic Device;Dry needling;Ultrasound    PT Next Visit Plan  see protocol, 12 weeks post op 10/09/18; continue AROM/AAROM and strengthening, modalities PRN for pain relief    Consulted and Agree with Plan of Care  Patient       Patient will benefit from skilled therapeutic intervention in order to improve the following deficits and impairments:  Decreased activity tolerance, Decreased range of motion, Decreased strength, Pain, Impaired UE functional use  Visit Diagnosis: Muscle weakness (generalized)  Stiffness of left shoulder, not elsewhere classified  Acute pain of left shoulder     Problem List Patient Active Problem List   Diagnosis Date Noted  . Urticaria 09/08/2018  . S/p reverse total shoulder arthroplasty 07/17/2018  . History of colon cancer 06/12/2018  . History of colonic polyps 06/12/2018  . 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  . Gastroesophageal reflux disease 06/12/2018  . Chronic diarrhea 06/26/2017  . Iron deficiency anemia 06/26/2017  . GAD (generalized anxiety disorder) 08/28/2016  . Vitamin D deficiency 08/28/2016  . GERD  (gastroesophageal reflux disease) 08/28/2016  . Insomnia 08/28/2016  . Convulsions ([place]) 11/30/2015  . Liver transplant recipient [facility]) 11/30/2015  . Seizures ([place]) 09/27/2015  . Essential hypertension, benign 03/10/2015  . Hx of liver transplant ([place]) 12/08/2014  . Diarrhea 12/08/2014  . Colon cancer ([place]) 12/08/2014  . Hepatic encephalopathy ([place]) 10/26/2013  . Anemia of chronic disease 10/26/2013  . Personal history of colonic polyps 12/27/2012  . PBC (primary biliary cirrhosis) 03/24/2012  . Hypothyroidism 03/24/2012  . Ventral hernia, recurrent 03/24/2012   Gabriela Eves, PT, DPT 10/22/2018, 10:06 AM  [facility] [address] [place], [place], [address] Phone: [phone]   Fax:  [phone]  Name: HANIFA ANTONETTI MRN: [id number] Date of Birth: 1951-10-24

## 2018-10-23 ENCOUNTER — Inpatient Hospital Stay (HOSPITAL_COMMUNITY): Payer: Medicare Other | Attending: Internal Medicine

## 2018-10-23 ENCOUNTER — Telehealth: Payer: Self-pay | Admitting: *Deleted

## 2018-10-23 DIAGNOSIS — D509 Iron deficiency anemia, unspecified: Secondary | ICD-10-CM | POA: Diagnosis present

## 2018-10-23 DIAGNOSIS — Z1509 Genetic susceptibility to other malignant neoplasm: Secondary | ICD-10-CM | POA: Insufficient documentation

## 2018-10-23 DIAGNOSIS — Z85038 Personal history of other malignant neoplasm of large intestine: Secondary | ICD-10-CM | POA: Diagnosis not present

## 2018-10-23 DIAGNOSIS — Z7982 Long term (current) use of aspirin: Secondary | ICD-10-CM | POA: Insufficient documentation

## 2018-10-23 DIAGNOSIS — I10 Essential (primary) hypertension: Secondary | ICD-10-CM | POA: Diagnosis not present

## 2018-10-23 DIAGNOSIS — Z944 Liver transplant status: Secondary | ICD-10-CM | POA: Insufficient documentation

## 2018-10-23 DIAGNOSIS — D75839 Thrombocytosis, unspecified: Secondary | ICD-10-CM

## 2018-10-23 DIAGNOSIS — D473 Essential (hemorrhagic) thrombocythemia: Secondary | ICD-10-CM

## 2018-10-23 DIAGNOSIS — Z79899 Other long term (current) drug therapy: Secondary | ICD-10-CM | POA: Insufficient documentation

## 2018-10-23 DIAGNOSIS — D508 Other iron deficiency anemias: Secondary | ICD-10-CM

## 2018-10-23 LAB — CBC WITH DIFFERENTIAL/PLATELET
Abs Immature Granulocytes: 0.02 10*3/uL (ref 0.00–0.07)
Basophils Absolute: 0.1 10*3/uL (ref 0.0–0.1)
Basophils Relative: 2 %
EOS PCT: 5 %
Eosinophils Absolute: 0.3 10*3/uL (ref 0.0–0.5)
HCT: 37.1 % (ref 36.0–46.0)
Hemoglobin: 10.9 g/dL — ABNORMAL LOW (ref 12.0–15.0)
Immature Granulocytes: 0 %
Lymphocytes Relative: 27 %
Lymphs Abs: 1.6 10*3/uL (ref 0.7–4.0)
MCH: 27 pg (ref 26.0–34.0)
MCHC: 29.4 g/dL — ABNORMAL LOW (ref 30.0–36.0)
MCV: 92.1 fL (ref 80.0–100.0)
Monocytes Absolute: 0.9 10*3/uL (ref 0.1–1.0)
Monocytes Relative: 15 %
NRBC: 0 % (ref 0.0–0.2)
Neutro Abs: 3.1 10*3/uL (ref 1.7–7.7)
Neutrophils Relative %: 51 %
Platelets: 298 10*3/uL (ref 150–400)
RBC: 4.03 MIL/uL (ref 3.87–5.11)
RDW: 18.7 % — ABNORMAL HIGH (ref 11.5–15.5)
WBC: 5.9 10*3/uL (ref 4.0–10.5)

## 2018-10-23 LAB — COMPREHENSIVE METABOLIC PANEL
ALT: 19 U/L (ref 0–44)
AST: 23 U/L (ref 15–41)
Albumin: 3.3 g/dL — ABNORMAL LOW (ref 3.5–5.0)
Alkaline Phosphatase: 101 U/L (ref 38–126)
Anion gap: 5 (ref 5–15)
BUN: 12 mg/dL (ref 8–23)
CO2: 26 mmol/L (ref 22–32)
Calcium: 8.1 mg/dL — ABNORMAL LOW (ref 8.9–10.3)
Chloride: 109 mmol/L (ref 98–111)
Creatinine, Ser: 1.13 mg/dL — ABNORMAL HIGH (ref 0.44–1.00)
GFR calc Af Amer: 58 mL/min — ABNORMAL LOW (ref >60)
GFR, EST NON AFRICAN AMERICAN: 50 mL/min — AB (ref >60)
Glucose, Bld: 89 mg/dL (ref 70–99)
Potassium: 4.1 mmol/L (ref 3.5–5.1)
Sodium: 140 mmol/L (ref 135–145)
Total Bilirubin: 0.2 mg/dL — ABNORMAL LOW (ref 0.3–1.2)
Total Protein: 6.6 g/dL (ref 6.5–8.1)

## 2018-10-23 LAB — LACTATE DEHYDROGENASE: LDH: 206 U/L — ABNORMAL HIGH (ref 98–192)

## 2018-10-23 LAB — TRYPTASE: Tryptase: 23 ug/L — ABNORMAL HIGH (ref 2.2–13.2)

## 2018-10-23 LAB — FERRITIN: Ferritin: 136 ng/mL (ref 11–307)

## 2018-10-23 NOTE — Telephone Encounter (Signed)
Called patient advised labs are pending to be read. Dr Neldon Mc patient states rash is better can you please advised on labs

## 2018-10-23 NOTE — Telephone Encounter (Signed)
Patient called for her lab results, and patient wanted to let the doctor know she is doing good with no more rashes, Please advise [phone]

## 2018-10-24 ENCOUNTER — Encounter: Payer: Self-pay | Admitting: Physical Therapy

## 2018-10-24 ENCOUNTER — Ambulatory Visit: Payer: Medicare Other | Admitting: Physical Therapy

## 2018-10-24 DIAGNOSIS — M6281 Muscle weakness (generalized): Secondary | ICD-10-CM

## 2018-10-24 DIAGNOSIS — M25612 Stiffness of left shoulder, not elsewhere classified: Secondary | ICD-10-CM

## 2018-10-24 DIAGNOSIS — M25512 Pain in left shoulder: Secondary | ICD-10-CM | POA: Diagnosis not present

## 2018-10-24 NOTE — Therapy (Signed)
[facility] [place], [place], [address] Phone: [phone]   Fax:  [phone]  Physical Therapy Treatment  Patient Details  Name: Cassidy Barber MRN: [id number] Date of Birth: 03-19-1951 Referring Provider (PT): Jenetta Loges, [place]   Encounter Date: 10/24/2018  PT End of Session - 10/24/18 0908    Visit Number  23    Number of Visits  42    Date for PT Re-Evaluation  11/21/18    Authorization Type  FOTO every 5th visit, Progress note every 10th visit    PT Start Time  0900    Activity Tolerance  Patient tolerated treatment well    Behavior During Therapy  [facility] for tasks assessed/performed       Past Medical History:  Diagnosis Date  . Anemia of chronic disease 10/26/2013  . Hernia of unspecified site of abdominal cavity without mention of obstruction or gangrene   . Hypertension   . Liver disease   . Lynch syndrome   . Lynch syndrome   . Primary biliary cirrhosis ([place]) 10/26/2013  . Thyroid disease     Past Surgical History:  Procedure Laterality Date  . ABDOMINAL HERNIA REPAIR     Patient's states that she has had 8- 9 hernia surgeries  . ABDOMINAL HYSTERECTOMY    . CHOLECYSTECTOMY  2007  . COLON RESECTION    . COLON SURGERY  2008   Done at [facility]  . COLONOSCOPY     Done at [facility]  . ESOPHAGOGASTRODUODENOSCOPY N/A 08/21/2018   Procedure: ESOPHAGOGASTRODUODENOSCOPY (EGD);  Surgeon: Rogene Houston, MD;  [facility];  Service: Endoscopy;  Laterality: N/A;  . EYE SURGERY     lasix  . FLEXIBLE SIGMOIDOSCOPY N/A 10/20/2015   Procedure: FLEXIBLE SIGMOIDOSCOPY;  Surgeon: Rogene Houston, MD;  [facility];  Service: Endoscopy;  Laterality: N/A;  5 - Dr Laural Golden has meeting until 1:00  . FLEXIBLE SIGMOIDOSCOPY N/A 07/11/2016   Procedure: FLEXIBLE SIGMOIDOSCOPY;  Surgeon: Rogene Houston, MD;  [facility];  Service: Endoscopy;  Laterality: N/A;  1200  . FLEXIBLE SIGMOIDOSCOPY N/A  08/09/2017   Procedure: FLEXIBLE SIGMOIDOSCOPY;  Surgeon: Rogene Houston, MD;  [facility];  Service: Endoscopy;  Laterality: N/A;  1:00  . FLEXIBLE SIGMOIDOSCOPY N/A 08/21/2018   Procedure: FLEXIBLE SIGMOIDOSCOPY;  Surgeon: Rogene Houston, MD;  [facility];  Service: Endoscopy;  Laterality: N/A;  . HERNIA REPAIR    . LIVER TRANSPLANT  [id number]  . multiple skin cancers removed    . POLYPECTOMY  08/09/2017   Procedure: POLYPECTOMY;  Surgeon: Rogene Houston, MD;  [facility];  Service: Endoscopy;;  colon small bowel  . REVERSE SHOULDER ARTHROPLASTY Left 07/17/2018  . REVERSE SHOULDER ARTHROPLASTY Left 07/17/2018   Procedure: LEFT REVERSE SHOULDER ARTHROPLASTY;  Surgeon: Justice Britain, MD;  Location: [place];  Service: Orthopedics;  Laterality: Left;  118min  . SPLENECTOMY  2006  . TYMPANOSTOMY TUBE PLACEMENT    . UPPER GASTROINTESTINAL ENDOSCOPY     Done at [facility]    There were no vitals filed for this visit.  Subjective Assessment - 10/24/18 0908    Subjective  Patient reports feeling sore but feeling like she is gaining more function.    Pertinent History  L reverse total shoulder replacement 07/17/18, OA    Limitations  Lifting;House hold activities    Patient Stated Goals  use arm again and return to gardening  Currently in Pain?  No/denies         [facility] PT Assessment - 10/24/18 0001      Assessment   Medical Diagnosis  left reverse total shoulder replacement    Referring Provider (PT)  Jenetta Loges, PA-C    Onset Date/Surgical Date  07/17/18    Hand Dominance  Right    Next MD Visit  March    Prior Therapy  yes      Precautions   Precautions  Shoulder    Type of Shoulder Precautions  see media for protocol                   [facility] Adult PT Treatment/Exercise - 10/24/18 0001      Exercises   Exercises  Shoulder      Shoulder Exercises: Standing   Extension  Strengthening;Left;20 reps    Theraband Level (Shoulder  Extension)  Level 2 (Red)    Row  Left;Strengthening;20 reps;Theraband    Theraband Level (Shoulder Row)  Level 2 (Red)    Other Standing Exercises  ER isometric step outs yellow band 2 minutes, functional flexion cone to first shelf x2 minutes      Shoulder Exercises: Pulleys   Flexion  5 minutes      Shoulder Exercises: ROM/Strengthening   UBE (Upper Arm Bike)  60 RPM 10 min 5 fwd, 5 bwd    Wall Wash  wall wash with elbow flexion followed by flexion wall wash 3 minutes each      Shoulder Exercises: Stretch   Internal Rotation Stretch  --    Internal Rotation Stretch Limitations  --      Manual Therapy   Manual Therapy  Passive ROM    Passive ROM  PROM of L shoulder into flex, ER, IR at 90 ABD with gentle holds at end range                  PT Long Term Goals - 10/06/18 1024      PT LONG TERM GOAL #1   Title  Patient will be independent with HEP and its progression    Time  6    Period  Weeks    Status  Achieved      PT LONG TERM GOAL #2   Title  Patient will demonstrate 130+ degrees of left shoulder flexion AROM to improve ability to perform functional tasks    Period  Weeks    Status  On-going      PT LONG TERM GOAL #3   Title  Patient will demonstrate 65+ degrees of left ER AROM to don/doff apparel.    Time  6    Period  Weeks    Status  On-going      PT LONG TERM GOAL #4   Title  Patient will demonstrate 4/5 or greater left shoulder MMT in all planes to improve stability during functional tasks.    Time  6    Period  Weeks    Status  On-going      PT LONG TERM GOAL #5   Title  Patient will report ability to perform all ADLs independently with less than 4/10 left shoulder pain.    Time  6    Period  Weeks    Status  Achieved            Plan - 10/24/18 1950    Clinical Impression Statement  Patient was able to tolerate treatment well despite ongoing soreness.  Patient still demonstrates ER weakness as noted by the inability to perform  concentric ER with yellow band. Patient noted with improved isometric step outs and did not require verbal or tactile cues for form. Patient provided with yellow theraband to perform exercises at home. PROM continues to demonstrate smooth arc of motion but noted with increased tightness in flexion.     Clinical Presentation  Stable    Clinical Decision Making  Low    PT Frequency  3x / week    PT Duration  6 weeks    PT Treatment/Interventions  ADLs/Self Care Home Management;Moist Heat;Electrical Stimulation;Cryotherapy;Patient/family education;Therapeutic exercise;Therapeutic activities;Neuromuscular re-education;Passive range of motion;Manual techniques;Vasopneumatic Device;Dry needling;Ultrasound    PT Next Visit Plan  see protocol, 12 weeks post op 10/09/18; continue AROM/AAROM and strengthening, modalities PRN for pain relief    PT Home Exercise Plan  IR and ER isometric step outs rows, extension    Consulted and Agree with Plan of Care  Patient       Patient will benefit from skilled therapeutic intervention in order to improve the following deficits and impairments:     Visit Diagnosis: Muscle weakness (generalized)  Stiffness of left shoulder, not elsewhere classified     Problem List Patient Active Problem List   Diagnosis Date Noted  . Urticaria 09/08/2018  . S/p reverse total shoulder arthroplasty 07/17/2018  . History of colon cancer 06/12/2018  . History of colonic polyps 06/12/2018  . 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  . Gastroesophageal reflux disease 06/12/2018  . Chronic diarrhea 06/26/2017  . Iron deficiency anemia 06/26/2017  . GAD (generalized anxiety disorder) 08/28/2016  . Vitamin D deficiency 08/28/2016  . GERD (gastroesophageal reflux disease) 08/28/2016  . Insomnia 08/28/2016  . Convulsions ([place]) 11/30/2015  . Liver transplant recipient [facility]) 11/30/2015  . Seizures ([place]) 09/27/2015  . Essential hypertension, benign 03/10/2015  . Hx of liver transplant ([place])  12/08/2014  . Diarrhea 12/08/2014  . Colon cancer ([place]) 12/08/2014  . Hepatic encephalopathy ([place]) 10/26/2013  . Anemia of chronic disease 10/26/2013  . Personal history of colonic polyps 12/27/2012  . PBC (primary biliary cirrhosis) 03/24/2012  . Hypothyroidism 03/24/2012  . Ventral hernia, recurrent 03/24/2012   Gabriela Eves, PT, DPT 10/24/2018, 10:00 AM  [facility] [address] [place], [place], [address] Phone: [phone]   Fax:  [phone]  Name: Cassidy Barber MRN: [id number] Date of Birth: 07/22/1951

## 2018-10-27 ENCOUNTER — Ambulatory Visit: Payer: Medicare Other | Admitting: Physical Therapy

## 2018-10-27 ENCOUNTER — Telehealth: Payer: Self-pay

## 2018-10-27 DIAGNOSIS — L501 Idiopathic urticaria: Secondary | ICD-10-CM

## 2018-10-27 NOTE — Telephone Encounter (Signed)
Spoke with patient and informed her that she would need to have tryptase labs redrawn.  She voiced understanding and stated that she would come by office Wednesday or Thursday to have drawn.  Orders placed.

## 2018-10-27 NOTE — Telephone Encounter (Signed)
-----   Message from Jiles Prows, MD sent at 10/27/2018  3:06 PM EST ----- Please have blood drawn this week

## 2018-10-28 ENCOUNTER — Encounter (HOSPITAL_COMMUNITY): Payer: Self-pay | Admitting: Internal Medicine

## 2018-10-28 ENCOUNTER — Inpatient Hospital Stay (HOSPITAL_BASED_OUTPATIENT_CLINIC_OR_DEPARTMENT_OTHER): Payer: Medicare Other | Admitting: Internal Medicine

## 2018-10-28 VITALS — BP 135/77 | HR 89 | Temp 98.3°F | Resp 18 | Wt 147.3 lb

## 2018-10-28 DIAGNOSIS — D509 Iron deficiency anemia, unspecified: Secondary | ICD-10-CM

## 2018-10-28 DIAGNOSIS — D473 Essential (hemorrhagic) thrombocythemia: Secondary | ICD-10-CM

## 2018-10-28 DIAGNOSIS — Z85038 Personal history of other malignant neoplasm of large intestine: Secondary | ICD-10-CM | POA: Diagnosis not present

## 2018-10-28 DIAGNOSIS — D75839 Thrombocytosis, unspecified: Secondary | ICD-10-CM

## 2018-10-28 DIAGNOSIS — Z79899 Other long term (current) drug therapy: Secondary | ICD-10-CM

## 2018-10-28 DIAGNOSIS — Z946 Bone transplant status: Secondary | ICD-10-CM | POA: Diagnosis not present

## 2018-10-28 DIAGNOSIS — L501 Idiopathic urticaria: Secondary | ICD-10-CM | POA: Diagnosis not present

## 2018-10-28 DIAGNOSIS — Z7982 Long term (current) use of aspirin: Secondary | ICD-10-CM | POA: Diagnosis not present

## 2018-10-28 DIAGNOSIS — I10 Essential (primary) hypertension: Secondary | ICD-10-CM

## 2018-10-28 DIAGNOSIS — Z944 Liver transplant status: Secondary | ICD-10-CM | POA: Diagnosis not present

## 2018-10-28 DIAGNOSIS — D508 Other iron deficiency anemias: Secondary | ICD-10-CM

## 2018-10-28 DIAGNOSIS — Z1509 Genetic susceptibility to other malignant neoplasm: Secondary | ICD-10-CM | POA: Diagnosis not present

## 2018-10-28 NOTE — Patient Instructions (Signed)
[facility] Discharge Instructions  You were seen by Dr. Higgs today   Thank you for choosing [facility] at [facility] to provide your oncology and hematology care.  To afford each patient quality time with our provider, please arrive at least 15 minutes before your scheduled appointment time.   If you have a lab appointment with the Cancer Center please come in thru the  Main Entrance and check in at the main information desk  You need to re-schedule your appointment should you arrive 10 or more minutes late.  We strive to give you quality time with our providers, and arriving late affects you and other patients whose appointments are after yours.  Also, if you no show three or more times for appointments you may be dismissed from the clinic at the providers discretion.     Again, thank you for choosing [facility].  Our hope is that these requests will decrease the amount of time that you wait before being seen by our physicians.       _____________________________________________________________  Should you have questions after your visit to [facility], please contact our office at [phone] between the hours of 8:00 a.m. and 4:30 p.m.  Voicemails left after 4:00 p.m. will not be returned until the following business day.  For prescription refill requests, have your pharmacy contact our office and allow 72 hours.    Cancer Center Support Programs:   > Cancer Support Group  2nd Tuesday of the month 1pm-2pm, Journey Room   

## 2018-10-28 NOTE — Progress Notes (Signed)
Diagnosis Other iron deficiency anemia - Plan: CBC with Differential/Platelet, Comprehensive metabolic panel, Lactate dehydrogenase, Ferritin  Thrombocytosis (HCC) - Plan: CBC with Differential/Platelet, Comprehensive metabolic panel, Lactate dehydrogenase, Ferritin  Staging Cancer Staging No matching staging information was found for the patient.  Assessment and Plan:  1.  Thrombocytosis.  Pt has history of a liver transplant.  She reports she was told in the past she had elevated platelet count.  She has also had elevations in her alkaline phosphatase.  She is followed by the [place] transplant service at [facility].   Labs done 08/19/2018 reviewed and showed a white count 8.4 hemoglobin 11.3 platelets 504,000.  She has a normal differential.  Ferritin was 33, LDH 196, potassium was 4.4, creatinine was 0.83,  She has normal liver function tests, alkaline phosphatase was 319.  Sed rate was elevated at 33.    Pt has negative Jak 2 and BCR/Abl.  Pt also had evidence of Iron deficiency anemia with ferritin of 33.   Pt was treated with IV iron in 09/2018.  Labs done 10/23/2018 reviewed and showed WBC 5.9 HB 10.9 and plts 298,000.  Chemistries WNL with K+ 4.1 Cr 1.13 and normal LFTs.  Ferritin is improved at 136.  PLT count is improved and is WNl at 298,000.  Pt will be seen for follow-up in 04/2019 with labs.  Suspect reactive process due to IDA.    2.  IDA.  Pt was treated with IV iron in 09/2018.  Labs done 10/23/2018 reviewed and showed WBC 5.9 HB 10.9 and plts 298,000.  Chemistries WNL with K+ 4.1 Cr 1.13 and normal LFTs.  Ferritin is improved at 136.  Pt should follow-up with GI as directed.  She will have repeat labs in 04/2019.    3.  Liver transplant.  Pt is followed by the liver transplant team.  AST and ALT are normal.  She has a bilirubin of 0.2.  Alkaline phosphatase 101.  She should follow-up with transplant team at [facility] as recommended.  4.  Lynch syndrome.  The patient has undergone colectomy.   She reports she does mammogram screening on a consistent basis. Pt should also follow-up with GI as recommended.    5.  Hypertension.  Blood pressure is 135/77.  Follow-up with PCP.  Historical data obtained from note dated 08/19/2018:  67 year old female who has a history of a liver transplant.  She reports she was told in the past she had elevated platelet count.  She has also had elevations in her alkaline phosphatase.  She is followed by the [place] transplant service at [facility].  Labs there were done 08/11/2018 showed a white count of 8.6 hemoglobin 10.7 platelets 716,000.  She has a normal differential.  Chemistries within normal limits with a potassium of 4.6 and creatinine of 0.88.  It was noted her alkaline phosphatase was elevated at 546.  Liver function tests were normal bilirubin was 0.2.  She reports she had recent shoulder surgery.  Denies any headaches or blurred vision.  Patient is seen today for consultation due to thrombocytosis.  Current Status:  Pt is here to go over labs.   Problem List Patient Active Problem List   Diagnosis Date Noted  . Urticaria [L50.9] 09/08/2018  . S/p reverse total shoulder arthroplasty [Z96.619] 07/17/2018  . History of colon cancer [Z85.038] 06/12/2018  . History of colonic polyps [Z86.010] 06/12/2018  . Lynch syndrome [Z15.09] 06/12/2018  . Gastroesophageal reflux disease [K21.9] 06/12/2018  . Chronic diarrhea [K52.9] 06/26/2017  .  Iron deficiency anemia [D50.9] 06/26/2017  . GAD (generalized anxiety disorder) [F41.1] 08/28/2016  . Vitamin D deficiency [E55.9] 08/28/2016  . GERD (gastroesophageal reflux disease) [K21.9] 08/28/2016  . Insomnia [G47.00] 08/28/2016  . Convulsions ([place]) [R56.9] 11/30/2015  . Liver transplant recipient [facility]) [Z94.4] 11/30/2015  . Seizures ([place]) [R56.9] 09/27/2015  . Essential hypertension, benign [I10] 03/10/2015  . Hx of liver transplant ([place]) [Z94.4] 12/08/2014  . Diarrhea [R19.7] 12/08/2014  . Colon cancer ([place])  [C18.9] 12/08/2014  . Hepatic encephalopathy ([place]) [K72.90] 10/26/2013  . Anemia of chronic disease [D63.8] 10/26/2013  . Personal history of colonic polyps [Z86.010] 12/27/2012  . PBC (primary biliary cirrhosis) [K74.3] 03/24/2012  . Hypothyroidism [E03.9] 03/24/2012  . Ventral hernia, recurrent [K43.2] 03/24/2012    Past Medical History Past Medical History:  Diagnosis Date  . Anemia of chronic disease 10/26/2013  . Hernia of unspecified site of abdominal cavity without mention of obstruction or gangrene   . Hypertension   . Liver disease   . Lynch syndrome   . Lynch syndrome   . Primary biliary cirrhosis ([place]) 10/26/2013  . Thyroid disease     Past Surgical History Past Surgical History:  Procedure Laterality Date  . ABDOMINAL HERNIA REPAIR     Patient's states that she has had 8- 9 hernia surgeries  . ABDOMINAL HYSTERECTOMY    . CHOLECYSTECTOMY  2007  . COLON RESECTION    . COLON SURGERY  2008   Done at [facility]  . COLONOSCOPY     Done at [facility]  . ESOPHAGOGASTRODUODENOSCOPY N/A 08/21/2018   Procedure: ESOPHAGOGASTRODUODENOSCOPY (EGD);  Surgeon: Rogene Houston, MD;  [facility];  Service: Endoscopy;  Laterality: N/A;  . EYE SURGERY     lasix  . FLEXIBLE SIGMOIDOSCOPY N/A 10/20/2015   Procedure: FLEXIBLE SIGMOIDOSCOPY;  Surgeon: Rogene Houston, MD;  [facility];  Service: Endoscopy;  Laterality: N/A;  34 - Dr Laural Golden has meeting until 1:00  . FLEXIBLE SIGMOIDOSCOPY N/A 07/11/2016   Procedure: FLEXIBLE SIGMOIDOSCOPY;  Surgeon: Rogene Houston, MD;  [facility];  Service: Endoscopy;  Laterality: N/A;  1200  . FLEXIBLE SIGMOIDOSCOPY N/A 08/09/2017   Procedure: FLEXIBLE SIGMOIDOSCOPY;  Surgeon: Rogene Houston, MD;  [facility];  Service: Endoscopy;  Laterality: N/A;  1:00  . FLEXIBLE SIGMOIDOSCOPY N/A 08/21/2018   Procedure: FLEXIBLE SIGMOIDOSCOPY;  Surgeon: Rogene Houston, MD;  [facility];  Service: Endoscopy;   Laterality: N/A;  . HERNIA REPAIR    . LIVER TRANSPLANT  [id number]  . multiple skin cancers removed    . POLYPECTOMY  08/09/2017   Procedure: POLYPECTOMY;  Surgeon: Rogene Houston, MD;  [facility];  Service: Endoscopy;;  colon small bowel  . REVERSE SHOULDER ARTHROPLASTY Left 07/17/2018  . REVERSE SHOULDER ARTHROPLASTY Left 07/17/2018   Procedure: LEFT REVERSE SHOULDER ARTHROPLASTY;  Surgeon: Justice Britain, MD;  Location: [place];  Service: Orthopedics;  Laterality: Left;  165min  . SPLENECTOMY  2006  . TYMPANOSTOMY TUBE PLACEMENT    . UPPER GASTROINTESTINAL ENDOSCOPY     Done at [facility]    Family History Family History  Problem Relation Age of Onset  . Prostate cancer Father   . Colon cancer Father   . Colon cancer Sister   . Lung cancer Sister   . Healthy Son   . Alcohol abuse Brother   . Allergic rhinitis Neg Hx   . Asthma Neg Hx   . Eczema Neg Hx   .  Urticaria Neg Hx      Social History  reports that she has never smoked. She has never used smokeless tobacco. She reports that she does not drink alcohol or use drugs.  Medications  Current Outpatient Medications:  .  acetaminophen (TYLENOL) 500 MG tablet, Take 1,000 mg by mouth 2 (two) times daily as needed for moderate pain or headache., Disp: , Rfl:  .  alendronate (FOSAMAX) 70 MG tablet, TAKE 1 TABLET BY MOUTH ONCE A WEEK WITH A FULL GLASS OF WATER ON AN EMPTY STOMACH, Disp: 12 tablet, Rfl: 1 .  ALPRAZolam (XANAX) 0.5 MG tablet, Take 1 tablet (0.5 mg total) by mouth at bedtime as needed. (Patient taking differently: Take 0.5 mg by mouth at bedtime. ), Disp: 30 tablet, Rfl: 5 .  aspirin EC 81 MG tablet, Take 81 mg by mouth daily., Disp: , Rfl:  .  Biotin 10000 MCG TABS, Take 10,000 mcg by mouth daily., Disp: , Rfl:  .  CALCIUM CITRATE PO, Take 1,200 mg by mouth 2 (two) times daily. , Disp: , Rfl:  .  cetirizine (ZYRTEC) 10 MG tablet, Take 1-2 tablets 2 times a day max 40mg , Disp: 120 tablet, Rfl: 5 .   cyclobenzaprine (FLEXERIL) 5 MG tablet, Take 1 tablet (5 mg total) by mouth 3 (three) times daily as needed for muscle spasms. (Patient taking differently: Take 5 mg by mouth 2 (two) times daily as needed for muscle spasms. ), Disp: 60 tablet, Rfl: 1 .  EPINEPHrine 0.3 mg/0.3 mL IJ SOAJ injection, Use as directed for severe allergic reaction, Disp: 2 Device, Rfl: 2 .  everolimus (AFINITOR) 5 MG tablet, Take 12.5 mg by mouth 2 (two) times daily., Disp: , Rfl:  .  famotidine (PEPCID) 20 MG tablet, Take 1 tablet twice a day, Disp: 60 tablet, Rfl: 5 .  ferrous sulfate 325 (65 FE) MG tablet, Take 325 mg by mouth 2 (two) times daily with a meal. , Disp: , Rfl:  .  fluorouracil (EFUDEX) 5 % cream, Apply 1 application topically daily as needed (cancer spots). , Disp: , Rfl: 0 .  fluticasone (FLONASE) 50 MCG/ACT nasal spray, Place 2 sprays into both nostrils daily. (Patient taking differently: Place 2 sprays into both nostrils daily as needed for allergies. ), Disp: 16 g, Rfl: 6 .  HYDROcodone-acetaminophen (NORCO/VICODIN) 5-325 MG tablet, Take 1 tablet by mouth 3 (three) times daily as needed for moderate pain., Disp: , Rfl:  .  hydrocortisone cream 1 %, Apply 1 application topically daily as needed for itching., Disp: , Rfl:  .  hydrOXYzine (ATARAX/VISTARIL) 25 MG tablet, TAKE 1 TABLET THREE TIMES DAILY AS NEEDED FOR ITCHING, Disp: 60 tablet, Rfl: 5 .  levothyroxine (SYNTHROID) 125 MCG tablet, Take 1 tablet (125 mcg total) by mouth daily., Disp: 90 tablet, Rfl: 1 .  loperamide (IMODIUM) 2 MG capsule, TAKE 2 CAPSULES 4 TIMES DAILY AS NEEDED (Patient taking differently: Take 4 mg by mouth 4 (four) times daily as needed for diarrhea or loose stools. ), Disp: 90 capsule, Rfl: 1 .  loratadine (CLARITIN) 10 MG tablet, Take 1 tablet (10 mg total) by mouth daily., Disp: 90 tablet, Rfl: 3 .  losartan (COZAAR) 25 MG tablet, Take 1 tablet (25 mg total) by mouth daily., Disp: 90 tablet, Rfl: 0 .  magnesium oxide (MAG-OX)  400 MG tablet, Take 400 mg by mouth daily., Disp: , Rfl:  .  metoprolol succinate (TOPROL-XL) 25 MG 24 hr tablet, TAKE 1 TABLET BY MOUTH ONCE DAILY,  Disp: 90 tablet, Rfl: 1 .  montelukast (SINGULAIR) 10 MG tablet, Take 1 tablet (10 mg total) by mouth at bedtime., Disp: 30 tablet, Rfl: 5 .  ondansetron (ZOFRAN) 4 MG tablet, Take 4 mg by mouth every 8 (eight) hours as needed for nausea or vomiting., Disp: , Rfl:  .  pantoprazole (PROTONIX) 40 MG tablet, Take 1 tablet (40 mg total) by mouth daily before breakfast., Disp: 90 tablet, Rfl: 2 .  propranolol (INDERAL) 80 MG tablet, Take 80 mg by mouth daily. , Disp: , Rfl:  .  ranitidine (ZANTAC) 150 MG tablet, Take 1 tablet (150 mg total) by mouth 2 (two) times daily., Disp: 60 tablet, Rfl: 6 .  ursodiol (ACTIGALL) 250 MG tablet, Take 300 mg by mouth 2 (two) times daily. , Disp: , Rfl:  .  vitamin B-12 (CYANOCOBALAMIN) 1000 MCG tablet, Take 1,000 mcg by mouth daily., Disp: , Rfl:  .  Vitamin D, Ergocalciferol, (DRISDOL) 50000 units CAPS capsule, Take 1 capsule (50,000 Units total) by mouth once a week., Disp: 12 capsule, Rfl: 2 .  vitamin E (VITAMIN E) 400 UNIT capsule, Take 400 Units by mouth daily., Disp: , Rfl:   Allergies Ciprofloxacin and Codeine  Review of Systems Review of Systems - Oncology ROS negative   Physical Exam  Vitals Wt Readings from Last 3 Encounters:  10/28/18 147 lb 4.8 oz (66.8 kg)  10/21/18 146 lb 6.4 oz (66.4 kg)  09/08/18 143 lb (64.9 kg)   Temp Readings from Last 3 Encounters:  10/28/18 98.3 F (36.8 C) (Oral)  10/21/18 97.8 F (36.6 C) (Oral)  09/16/18 (!) 97.5 F (36.4 C) (Oral)   BP Readings from Last 3 Encounters:  10/28/18 135/77  10/21/18 120/80  09/16/18 136/67   Pulse Readings from Last 3 Encounters:  10/28/18 89  10/21/18 90  09/16/18 63   Constitutional: Well-developed, well-nourished, and in no distress.   HENT: Head: Normocephalic and atraumatic.  Mouth/Throat: No oropharyngeal exudate.  Mucosa moist. Eyes: Pupils are equal, round, and reactive to light. Conjunctivae are normal. No scleral icterus.  Neck: Normal range of motion. Neck supple. No JVD present.  Cardiovascular: Normal rate, regular rhythm and normal heart sounds.  Exam reveals no gallop and no friction rub.   No murmur heard. Pulmonary/Chest: Effort normal and breath sounds normal. No respiratory distress. No wheezes.No rales.  Abdominal: Soft. Bowel sounds are normal. No distension. There is no tenderness. There is no guarding.  Musculoskeletal: No edema or tenderness.  Lymphadenopathy: No cervical, axillary or supraclavicular adenopathy.  Neurological: Alert and oriented to person, place, and time. No cranial nerve deficit.  Skin: Skin is warm and dry. No rash noted. No erythema. No pallor.  Psychiatric: Affect and judgment normal.   Labs No visits with results within 3 Day(s) from this visit.  Latest known visit with results is:  Appointment on 10/23/2018  Component Date Value Ref Range Status  . WBC 10/23/2018 5.9  4.0 - 10.5 K/uL Final   WHITE COUNT CONFIRMED ON SMEAR  . RBC 10/23/2018 4.03  3.87 - 5.11 MIL/uL Final  . Hemoglobin 10/23/2018 10.9* 12.0 - 15.0 g/dL Final  . HCT 10/23/2018 37.1  36.0 - 46.0 % Final  . MCV 10/23/2018 92.1  80.0 - 100.0 fL Final  . MCH 10/23/2018 27.0  26.0 - 34.0 pg Final  . MCHC 10/23/2018 29.4* 30.0 - 36.0 g/dL Final  . RDW 10/23/2018 18.7* 11.5 - 15.5 % Final  . Platelets 10/23/2018 298  150 - 400  K/uL Final  . nRBC 10/23/2018 0.0  0.0 - 0.2 % Final  . Neutrophils Relative % 10/23/2018 51  % Final  . Neutro Abs 10/23/2018 3.1  1.7 - 7.7 K/uL Final  . Lymphocytes Relative 10/23/2018 27  % Final  . Lymphs Abs 10/23/2018 1.6  0.7 - 4.0 K/uL Final  . Monocytes Relative 10/23/2018 15  % Final  . Monocytes Absolute 10/23/2018 0.9  0.1 - 1.0 K/uL Final  . Eosinophils Relative 10/23/2018 5  % Final  . Eosinophils Absolute 10/23/2018 0.3  0.0 - 0.5 K/uL Final  . Basophils  Relative 10/23/2018 2  % Final  . Basophils Absolute 10/23/2018 0.1  0.0 - 0.1 K/uL Final  . Immature Granulocytes 10/23/2018 0  % Final  . Abs Immature Granulocytes 10/23/2018 0.02  0.00 - 0.07 K/uL Final   Performed at [facility], [address] [place], [place] [address]  . Sodium 10/23/2018 140  135 - 145 mmol/L Final  . Potassium 10/23/2018 4.1  3.5 - 5.1 mmol/L Final  . Chloride 10/23/2018 109  98 - 111 mmol/L Final  . CO2 10/23/2018 26  22 - 32 mmol/L Final  . Glucose, Bld 10/23/2018 89  70 - 99 mg/dL Final  . BUN 10/23/2018 12  8 - 23 mg/dL Final  . Creatinine, Ser 10/23/2018 1.13* 0.44 - 1.00 mg/dL Final  . Calcium 10/23/2018 8.1* 8.9 - 10.3 mg/dL Final  . Total Protein 10/23/2018 6.6  6.5 - 8.1 g/dL Final  . Albumin 10/23/2018 3.3* 3.5 - 5.0 g/dL Final  . AST 10/23/2018 23  15 - 41 U/L Final  . ALT 10/23/2018 19  0 - 44 U/L Final  . Alkaline Phosphatase 10/23/2018 101  38 - 126 U/L Final  . Total Bilirubin 10/23/2018 0.2* 0.3 - 1.2 mg/dL Final  . GFR calc non Af Amer 10/23/2018 50* >60 mL/min Final  . GFR calc Af Amer 10/23/2018 58* >60 mL/min Final  . Anion gap 10/23/2018 5  5 - 15 Final   Performed at [facility], [address], [place], [place] [address]  . LDH 10/23/2018 206* 98 - 192 U/L Final   Performed at [facility], [address], [place], [place] [address]  . Ferritin 10/23/2018 136  11 - 307 ng/mL Final   Performed at [facility], [address] [place],  [address]     Pathology Orders Placed This Encounter  Procedures  . CBC with Differential/Platelet    Standing Status:   Future    Standing Expiration Date:   10/28/2020  . Comprehensive metabolic panel    Standing Status:   Future    Standing Expiration Date:   10/28/2020  . Lactate dehydrogenase    Standing Status:   Future    Standing Expiration Date:   10/28/2020  . Ferritin    Standing Status:   Future    Standing Expiration Date:   10/28/2020       Zoila Shutter MD

## 2018-10-29 ENCOUNTER — Encounter: Payer: Self-pay | Admitting: Family

## 2018-10-29 ENCOUNTER — Ambulatory Visit (INDEPENDENT_AMBULATORY_CARE_PROVIDER_SITE_OTHER): Payer: Medicare Other | Admitting: Family

## 2018-10-29 ENCOUNTER — Ambulatory Visit: Payer: Medicare Other | Admitting: Physical Therapy

## 2018-10-29 ENCOUNTER — Encounter: Payer: Self-pay | Admitting: Physical Therapy

## 2018-10-29 VITALS — BP 131/79 | HR 81 | Temp 96.6°F | Ht 62.5 in | Wt 149.0 lb

## 2018-10-29 DIAGNOSIS — M6281 Muscle weakness (generalized): Secondary | ICD-10-CM

## 2018-10-29 DIAGNOSIS — Z8719 Personal history of other diseases of the digestive system: Secondary | ICD-10-CM | POA: Diagnosis not present

## 2018-10-29 DIAGNOSIS — M25612 Stiffness of left shoulder, not elsewhere classified: Secondary | ICD-10-CM | POA: Diagnosis not present

## 2018-10-29 DIAGNOSIS — R1031 Right lower quadrant pain: Secondary | ICD-10-CM | POA: Diagnosis not present

## 2018-10-29 DIAGNOSIS — R1084 Generalized abdominal pain: Secondary | ICD-10-CM | POA: Diagnosis not present

## 2018-10-29 DIAGNOSIS — M25512 Pain in left shoulder: Secondary | ICD-10-CM | POA: Diagnosis not present

## 2018-10-29 LAB — MICROSCOPIC EXAMINATION
BACTERIA UA: NONE SEEN
Renal Epithel, UA: NONE SEEN /hpf

## 2018-10-29 LAB — URINALYSIS, COMPLETE
Bilirubin, UA: NEGATIVE
Glucose, UA: NEGATIVE
Ketones, UA: NEGATIVE
Leukocytes, UA: NEGATIVE
Nitrite, UA: NEGATIVE
Protein, UA: NEGATIVE
Specific Gravity, UA: 1.01 (ref 1.005–1.030)
Urobilinogen, Ur: 0.2 mg/dL (ref 0.2–1.0)
pH, UA: 5 (ref 5.0–7.5)

## 2018-10-29 NOTE — Therapy (Signed)
[facility] [place], [place], [address] Phone: [phone]   Fax:  [phone]  Physical Therapy Treatment  Patient Details  Name: Cassidy Barber MRN: [id number] Date of Birth: Jun 29, 1951 Referring Provider (PT): Jenetta Loges, [place]   Encounter Date: 10/29/2018  PT End of Session - 10/29/18 0910    Visit Number  24    Number of Visits  42    Date for PT Re-Evaluation  11/21/18    Authorization Type  FOTO every 5th visit, Progress note every 10th visit    PT Start Time  0900    PT Stop Time  0957    PT Time Calculation (min)  57 min    Activity Tolerance  Patient tolerated treatment well    Behavior During Therapy  [facility] for tasks assessed/performed       Past Medical History:  Diagnosis Date  . Anemia of chronic disease 10/26/2013  . Hernia of unspecified site of abdominal cavity without mention of obstruction or gangrene   . Hypertension   . Liver disease   . Lynch syndrome   . Lynch syndrome   . Primary biliary cirrhosis ([place]) 10/26/2013  . Thyroid disease     Past Surgical History:  Procedure Laterality Date  . ABDOMINAL HERNIA REPAIR     Patient's states that she has had 8- 9 hernia surgeries  . ABDOMINAL HYSTERECTOMY    . CHOLECYSTECTOMY  2007  . COLON RESECTION    . COLON SURGERY  2008   Done at [facility]  . COLONOSCOPY     Done at [facility]  . ESOPHAGOGASTRODUODENOSCOPY N/A 08/21/2018   Procedure: ESOPHAGOGASTRODUODENOSCOPY (EGD);  Surgeon: Rogene Houston, MD;  [facility];  Service: Endoscopy;  Laterality: N/A;  . EYE SURGERY     lasix  . FLEXIBLE SIGMOIDOSCOPY N/A 10/20/2015   Procedure: FLEXIBLE SIGMOIDOSCOPY;  Surgeon: Rogene Houston, MD;  [facility];  Service: Endoscopy;  Laterality: N/A;  65 - Dr Laural Golden has meeting until 1:00  . FLEXIBLE SIGMOIDOSCOPY N/A 07/11/2016   Procedure: FLEXIBLE SIGMOIDOSCOPY;  Surgeon: Rogene Houston, MD;  [facility];  Service: Endoscopy;   Laterality: N/A;  1200  . FLEXIBLE SIGMOIDOSCOPY N/A 08/09/2017   Procedure: FLEXIBLE SIGMOIDOSCOPY;  Surgeon: Rogene Houston, MD;  [facility];  Service: Endoscopy;  Laterality: N/A;  1:00  . FLEXIBLE SIGMOIDOSCOPY N/A 08/21/2018   Procedure: FLEXIBLE SIGMOIDOSCOPY;  Surgeon: Rogene Houston, MD;  [facility];  Service: Endoscopy;  Laterality: N/A;  . HERNIA REPAIR    . LIVER TRANSPLANT  [id number]  . multiple skin cancers removed    . POLYPECTOMY  08/09/2017   Procedure: POLYPECTOMY;  Surgeon: Rogene Houston, MD;  [facility];  Service: Endoscopy;;  colon small bowel  . REVERSE SHOULDER ARTHROPLASTY Left 07/17/2018  . REVERSE SHOULDER ARTHROPLASTY Left 07/17/2018   Procedure: LEFT REVERSE SHOULDER ARTHROPLASTY;  Surgeon: Justice Britain, MD;  Location: [place];  Service: Orthopedics;  Laterality: Left;  148min  . SPLENECTOMY  2006  . TYMPANOSTOMY TUBE PLACEMENT    . UPPER GASTROINTESTINAL ENDOSCOPY     Done at [facility]    There were no vitals filed for this visit.  Subjective Assessment - 10/29/18 0909    Subjective  Patient reported taking a tylenol prior to therapy. She was compliant with HEP at home but reported pain and soreness in anterior shoulder starting on sunday.    Pertinent History  L reverse total shoulder replacement 07/17/18, OA    Limitations  Lifting;House hold activities    Patient Stated Goals  use arm again and return to gardening    Currently in Pain?  Yes    Pain Score  8     Pain Location  Shoulder    Pain Orientation  Left;Anterior    Pain Descriptors / Indicators  Sore    Pain Type  Surgical pain    Pain Onset  More than a month ago    Pain Frequency  Intermittent         [facility] PT Assessment - 10/29/18 0001      Assessment   Medical Diagnosis  left reverse total shoulder replacement    Referring Provider (PT)  Jenetta Loges, PA-C    Onset Date/Surgical Date  07/17/18    Hand Dominance  Right    Next MD Visit  March     Prior Therapy  yes      Precautions   Precautions  Shoulder                   [facility] Adult PT Treatment/Exercise - 10/29/18 0001      Exercises   Exercises  Shoulder      Shoulder Exercises: Prone   Extension  Strengthening;20 reps    Extension Weight (lbs)  1    Horizontal ABduction 1  AROM;Left;20 reps    Other Prone Exercises  row and mid row1# weight x20      Shoulder Exercises: Standing   Extension  Strengthening;Left;20 reps    Theraband Level (Shoulder Extension)  Level 2 (Red)    Row  Left;Strengthening;20 reps;Theraband    Theraband Level (Shoulder Row)  Level 2 (Red)    Other Standing Exercises  scapular slides with yellow theraband    Other Standing Exercises  ER isometric step outs yellow band 2 minutes, functional flexion from elevated surface to first shelf x2 minutes      Shoulder Exercises: Pulleys   Flexion  5 minutes      Shoulder Exercises: ROM/Strengthening   UBE (Upper Arm Bike)  60 RPM 10 min 5 fwd, 5 bwd      Electrical Stimulation   Electrical Stimulation Location  left shoulder    Electrical Stimulation Action  IFC    Electrical Stimulation Parameters  80-150 hz x15 min    Electrical Stimulation Goals  Pain      Vasopneumatic   Number Minutes Vasopneumatic   15 minutes    Vasopnuematic Location   Shoulder    Vasopneumatic Pressure  Low    Vasopneumatic Temperature   34                  PT Long Term Goals - 10/29/18 0911      PT LONG TERM GOAL #1   Title  Patient will be independent with HEP and its progression    Time  6    Period  Weeks    Status  Achieved      PT LONG TERM GOAL #2   Title  Patient will demonstrate 130+ degrees of left shoulder flexion AROM to improve ability to perform functional tasks    Time  6    Period  Weeks    Status  On-going      PT LONG TERM GOAL #3   Title  Patient will demonstrate 65+ degrees of left ER AROM to don/doff apparel.    Time  6  Period  Weeks    Status  On-going       PT LONG TERM GOAL #4   Title  Patient will demonstrate 4/5 or greater left shoulder MMT in all planes to improve stability during functional tasks.    Time  6    Period  Weeks    Status  On-going      PT LONG TERM GOAL #5   Title  Patient will report ability to perform all ADLs independently with less than 4/10 left shoulder pain.    Time  6    Period  Weeks    Status  Achieved            Plan - 10/29/18 0943    Clinical Impression Statement  Patien was able to tolerate treatment well despite reports of pain. Patient still continues to demonstrate weakness with AROM in all planes especially flexion. Patient noted with improvements with ER step out with improved form. Patient requested vaso and e-stim today due to increased soreness. Patient noted with normal response to modalities upon removal.     Clinical Presentation  Stable    Clinical Decision Making  Low    Rehab Potential  Good    PT Frequency  3x / week    PT Duration  6 weeks    PT Treatment/Interventions  ADLs/Self Care Home Management;Moist Heat;Electrical Stimulation;Cryotherapy;Patient/family education;Therapeutic exercise;Therapeutic activities;Neuromuscular re-education;Passive range of motion;Manual techniques;Vasopneumatic Device;Dry needling;Ultrasound    PT Next Visit Plan  see protocol, 12 weeks post op 10/09/18; continue AROM/AAROM and strengthening, modalities PRN for pain relief    Consulted and Agree with Plan of Care  Patient       Patient will benefit from skilled therapeutic intervention in order to improve the following deficits and impairments:  Decreased activity tolerance, Decreased range of motion, Decreased strength, Pain, Impaired UE functional use  Visit Diagnosis: Muscle weakness (generalized)  Stiffness of left shoulder, not elsewhere classified     Problem List Patient Active Problem List   Diagnosis Date Noted  . Urticaria 09/08/2018  . S/p reverse total shoulder arthroplasty  07/17/2018  . History of colon cancer 06/12/2018  . History of colonic polyps 06/12/2018  . 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  . Gastroesophageal reflux disease 06/12/2018  . Chronic diarrhea 06/26/2017  . Iron deficiency anemia 06/26/2017  . GAD (generalized anxiety disorder) 08/28/2016  . Vitamin D deficiency 08/28/2016  . GERD (gastroesophageal reflux disease) 08/28/2016  . Insomnia 08/28/2016  . Convulsions ([place]) 11/30/2015  . Liver transplant recipient [facility]) 11/30/2015  . Seizures ([place]) 09/27/2015  . Essential hypertension, benign 03/10/2015  . Hx of liver transplant ([place]) 12/08/2014  . Diarrhea 12/08/2014  . Colon cancer ([place]) 12/08/2014  . Hepatic encephalopathy ([place]) 10/26/2013  . Anemia of chronic disease 10/26/2013  . Personal history of colonic polyps 12/27/2012  . PBC (primary biliary cirrhosis) 03/24/2012  . Hypothyroidism 03/24/2012  . Ventral hernia, recurrent 03/24/2012   Gabriela Eves, PT, DPT 10/29/2018, 10:00 AM  [facility] [address] [place], [place], [address] Phone: [phone]   Fax:  [phone]  Name: Cassidy Barber MRN: [id number] Date of Birth: 1951/10/04

## 2018-10-29 NOTE — Progress Notes (Signed)
Subjective:    Patient ID: Cassidy Barber, female    DOB: July 08, 1951, 67 y.o.   MRN: [id number]  Chief Complaint  Patient presents with  . Abdominal Pain   Pt presents to the office today with intermittent low right abdominal pain that started months ago, but has become more frequent and worse. States the pain will come and go and will be a sharp 9-10 out 10 pain.   She has a hx of liver transplant. She also had a small bowel obstruction with incarcerated hernia on 04/04/18. She reports she has yellow discharge from her midline incision.  Abdominal Pain  This is a new problem. The current episode started more than 1 month ago. The onset quality is gradual. The problem occurs intermittently. The problem has been gradually worsening. The pain is located in the RLQ. The pain is at a severity of 8/10. The pain is moderate. The quality of the pain is sharp. The abdominal pain does not radiate. Pertinent negatives include no belching, constipation, diarrhea, dysuria, frequency, hematuria, nausea or vomiting. Nothing aggravates the pain. Relieved by: pressure. She has tried acetaminophen for the symptoms. The treatment provided mild relief. Prior diagnostic workup includes CT scan.      Review of Systems  Gastrointestinal: Positive for abdominal pain. Negative for constipation, diarrhea, nausea and vomiting.  Genitourinary: Negative for dysuria, frequency and hematuria.       Objective:   Physical Exam Vitals signs reviewed.  Constitutional:      General: She is not in acute distress.    Appearance: She is well-developed.  HENT:     Right Ear: External ear normal.  Neck:     Musculoskeletal: Normal range of motion and neck supple.     Thyroid: No thyromegaly.  Cardiovascular:     Heart sounds: Normal heart sounds. No murmur.  Pulmonary:     Effort: Pulmonary effort is normal. No respiratory distress.     Breath sounds: Normal breath sounds. No wheezing.  Abdominal:     General:  Bowel sounds are normal. There is no distension.     Palpations: Abdomen is soft.     Tenderness: There is abdominal tenderness (mild) in the right lower quadrant.       Comments: Midline incision healed except 0.4X0.4 cm area that is draining yellow discharge  Musculoskeletal: Normal range of motion.        General: No tenderness.  Skin:    General: Skin is warm and dry.  Neurological:     Mental Status: She is alert and oriented to person, place, and time.     Cranial Nerves: No cranial nerve deficit.     Deep Tendon Reflexes: Reflexes are normal and symmetric.  Psychiatric:        Behavior: Behavior normal.        Thought Content: Thought content normal.        Judgment: Judgment normal.     BP 131/79   Pulse 81   Temp (!) 96.6 F (35.9 C) (Oral)   Ht 5' 2.5" (1.588 m)   Wt 149 lb (67.6 kg)   BMI 26.82 kg/m      Assessment & Plan:  Cassidy Barber comes in today with chief complaint of Abdominal Pain   Diagnosis and orders addressed:  1. Generalized abdominal pain - Urinalysis, Complete  2. Right lower quadrant abdominal pain - CT Abdomen Pelvis W Contrast; Future  3. Hx of small bowel obstruction - CT Abdomen Pelvis W  Contrast; Future  Given patients hx and incision still draining I will order CT scan to rule out abscess or infection Labs reviewed she had drawn yesterday- Stable Keep follow up with specialists   [name], FNP

## 2018-10-29 NOTE — Patient Instructions (Signed)

## 2018-10-30 ENCOUNTER — Other Ambulatory Visit: Payer: Self-pay | Admitting: Family

## 2018-10-30 LAB — TRYPTASE: Tryptase: 8.9 ug/L (ref 2.2–13.2)

## 2018-10-31 ENCOUNTER — Ambulatory Visit: Payer: Medicare Other | Admitting: Physical Therapy

## 2018-10-31 ENCOUNTER — Encounter: Payer: Self-pay | Admitting: Physical Therapy

## 2018-10-31 DIAGNOSIS — M6281 Muscle weakness (generalized): Secondary | ICD-10-CM | POA: Diagnosis not present

## 2018-10-31 DIAGNOSIS — M25612 Stiffness of left shoulder, not elsewhere classified: Secondary | ICD-10-CM | POA: Diagnosis not present

## 2018-10-31 DIAGNOSIS — M25512 Pain in left shoulder: Secondary | ICD-10-CM | POA: Diagnosis not present

## 2018-10-31 NOTE — Therapy (Signed)
[facility] [place], [place], [address] Phone: [phone]   Fax:  [phone]  Physical Therapy Treatment  Patient Details  Name: Cassidy Barber MRN: [id number] Date of Birth: November 18, 1950 Referring Provider (PT): Jenetta Loges, [place]   Encounter Date: 10/31/2018  PT End of Session - 10/31/18 1037    Visit Number  25    Number of Visits  42    Date for PT Re-Evaluation  11/21/18    Authorization Type  FOTO every 5th visit, Progress note every 10th visit    PT Start Time  0900    PT Stop Time  0950    PT Time Calculation (min)  50 min    Activity Tolerance  Patient tolerated treatment well    Behavior During Therapy  [facility] for tasks assessed/performed       Past Medical History:  Diagnosis Date  . Anemia of chronic disease 10/26/2013  . Hernia of unspecified site of abdominal cavity without mention of obstruction or gangrene   . Hypertension   . Liver disease   . Lynch syndrome   . Lynch syndrome   . Primary biliary cirrhosis ([place]) 10/26/2013  . Thyroid disease     Past Surgical History:  Procedure Laterality Date  . ABDOMINAL HERNIA REPAIR     Patient's states that she has had 8- 9 hernia surgeries  . ABDOMINAL HYSTERECTOMY    . CHOLECYSTECTOMY  2007  . COLON RESECTION    . COLON SURGERY  2008   Done at [facility]  . COLONOSCOPY     Done at [facility]  . ESOPHAGOGASTRODUODENOSCOPY N/A 08/21/2018   Procedure: ESOPHAGOGASTRODUODENOSCOPY (EGD);  Surgeon: Rogene Houston, MD;  [facility];  Service: Endoscopy;  Laterality: N/A;  . EYE SURGERY     lasix  . FLEXIBLE SIGMOIDOSCOPY N/A 10/20/2015   Procedure: FLEXIBLE SIGMOIDOSCOPY;  Surgeon: Rogene Houston, MD;  [facility];  Service: Endoscopy;  Laterality: N/A;  6 - Dr Laural Golden has meeting until 1:00  . FLEXIBLE SIGMOIDOSCOPY N/A 07/11/2016   Procedure: FLEXIBLE SIGMOIDOSCOPY;  Surgeon: Rogene Houston, MD;  [facility];  Service: Endoscopy;   Laterality: N/A;  1200  . FLEXIBLE SIGMOIDOSCOPY N/A 08/09/2017   Procedure: FLEXIBLE SIGMOIDOSCOPY;  Surgeon: Rogene Houston, MD;  [facility];  Service: Endoscopy;  Laterality: N/A;  1:00  . FLEXIBLE SIGMOIDOSCOPY N/A 08/21/2018   Procedure: FLEXIBLE SIGMOIDOSCOPY;  Surgeon: Rogene Houston, MD;  [facility];  Service: Endoscopy;  Laterality: N/A;  . HERNIA REPAIR    . LIVER TRANSPLANT  [id number]  . multiple skin cancers removed    . POLYPECTOMY  08/09/2017   Procedure: POLYPECTOMY;  Surgeon: Rogene Houston, MD;  [facility];  Service: Endoscopy;;  colon small bowel  . REVERSE SHOULDER ARTHROPLASTY Left 07/17/2018  . REVERSE SHOULDER ARTHROPLASTY Left 07/17/2018   Procedure: LEFT REVERSE SHOULDER ARTHROPLASTY;  Surgeon: Justice Britain, MD;  Location: [place];  Service: Orthopedics;  Laterality: Left;  162min  . SPLENECTOMY  2006  . TYMPANOSTOMY TUBE PLACEMENT    . UPPER GASTROINTESTINAL ENDOSCOPY     Done at [facility]    There were no vitals filed for this visit.  Subjective Assessment - 10/31/18 1001    Subjective  Patient reported no new complaints.     Pertinent History  L reverse total shoulder replacement 07/17/18, OA    Limitations  Lifting;House hold activities    Patient Stated  Goals  use arm again and return to gardening    Currently in Pain?  No/denies         [facility] PT Assessment - 10/31/18 0001      Assessment   Medical Diagnosis  left reverse total shoulder replacement    Referring Provider (PT)  Jenetta Loges, PA-C    Onset Date/Surgical Date  07/17/18    Hand Dominance  Right    Next MD Visit  March    Prior Therapy  yes      Precautions   Precautions  Shoulder                   [facility] Adult PT Treatment/Exercise - 10/31/18 0001      Exercises   Exercises  Shoulder      Shoulder Exercises: Standing   Other Standing Exercises  functional flexion from elevated surface to first shelf x2 minutes      Shoulder  Exercises: Pulleys   Flexion  5 minutes      Shoulder Exercises: ROM/Strengthening   UBE (Upper Arm Bike)  60 RPM 10 min 5 fwd, 5 bwd      Shoulder Exercises: Stretch   Table Stretch - Flexion  Other (comment)   x10 3"   Table Stretch - Abduction  5 reps   15 seconds   Table Stretch - ABduction Limitations  scaption    Other Shoulder Stretches  doorway stretch 5" hold x20      Manual Therapy   Manual Therapy  Passive ROM    Passive ROM  PROM of L shoulder into flex, ER, IR at 90 ABD with gentle holds at end range                  PT Long Term Goals - 10/29/18 0911      PT LONG TERM GOAL #1   Title  Patient will be independent with HEP and its progression    Time  6    Period  Weeks    Status  Achieved      PT LONG TERM GOAL #2   Title  Patient will demonstrate 130+ degrees of left shoulder flexion AROM to improve ability to perform functional tasks    Time  6    Period  Weeks    Status  On-going      PT LONG TERM GOAL #3   Title  Patient will demonstrate 65+ degrees of left ER AROM to don/doff apparel.    Time  6    Period  Weeks    Status  On-going      PT LONG TERM GOAL #4   Title  Patient will demonstrate 4/5 or greater left shoulder MMT in all planes to improve stability during functional tasks.    Time  6    Period  Weeks    Status  On-going      PT LONG TERM GOAL #5   Title  Patient will report ability to perform all ADLs independently with less than 4/10 left shoulder pain.    Time  6    Period  Weeks    Status  Achieved            Plan - 10/31/18 1037    Clinical Impression Statement  Patient was able to tolerate treatment well with minimal reports of pain. Patient continues to demonstrate weakness and compensation with flexion. Patient noted with smooth arc of motion but patient reported tightness with IR  PROM. Patient opted out of modalities today.     Clinical Presentation  Stable    Clinical Decision Making  Low    Rehab Potential   Good    PT Frequency  3x / week    PT Duration  6 weeks    PT Treatment/Interventions  ADLs/Self Care Home Management;Moist Heat;Electrical Stimulation;Cryotherapy;Patient/family education;Therapeutic exercise;Therapeutic activities;Neuromuscular re-education;Passive range of motion;Manual techniques;Vasopneumatic Device;Dry needling;Ultrasound    PT Next Visit Plan  FOTO next visit ; see protocol, 12 weeks post op 10/09/18; continue AROM/AAROM and strengthening, modalities PRN for pain relief    PT Home Exercise Plan  IR and ER isometric step outs rows, extension    Consulted and Agree with Plan of Care  Patient       Patient will benefit from skilled therapeutic intervention in order to improve the following deficits and impairments:  Decreased activity tolerance, Decreased range of motion, Decreased strength, Pain, Impaired UE functional use  Visit Diagnosis: Muscle weakness (generalized)  Stiffness of left shoulder, not elsewhere classified  Acute pain of left shoulder     Problem List Patient Active Problem List   Diagnosis Date Noted  . Urticaria 09/08/2018  . S/p reverse total shoulder arthroplasty 07/17/2018  . History of colon cancer 06/12/2018  . History of colonic polyps 06/12/2018  . 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  . Gastroesophageal reflux disease 06/12/2018  . Chronic diarrhea 06/26/2017  . Iron deficiency anemia 06/26/2017  . GAD (generalized anxiety disorder) 08/28/2016  . Vitamin D deficiency 08/28/2016  . GERD (gastroesophageal reflux disease) 08/28/2016  . Insomnia 08/28/2016  . Convulsions ([place]) 11/30/2015  . Liver transplant recipient [facility]) 11/30/2015  . Seizures ([place]) 09/27/2015  . Essential hypertension, benign 03/10/2015  . Hx of liver transplant ([place]) 12/08/2014  . Diarrhea 12/08/2014  . Colon cancer ([place]) 12/08/2014  . Hepatic encephalopathy ([place]) 10/26/2013  . Anemia of chronic disease 10/26/2013  . Personal history of colonic polyps 12/27/2012  .  PBC (primary biliary cirrhosis) 03/24/2012  . Hypothyroidism 03/24/2012  . Ventral hernia, recurrent 03/24/2012   Gabriela Eves, PT, DPT 10/31/2018, 12:32 PM  [facility] [address] [place], [place], [address] Phone: [phone]   Fax:  [phone]  Name: Cassidy Barber MRN: [id number] Date of Birth: 11-18-1950

## 2018-11-07 ENCOUNTER — Encounter: Payer: Medicare Other | Admitting: Physical Therapy

## 2018-11-10 ENCOUNTER — Ambulatory Visit: Payer: Medicare Other | Admitting: Physical Therapy

## 2018-11-10 ENCOUNTER — Encounter: Payer: Self-pay | Admitting: Physical Therapy

## 2018-11-10 DIAGNOSIS — M25612 Stiffness of left shoulder, not elsewhere classified: Secondary | ICD-10-CM | POA: Diagnosis not present

## 2018-11-10 DIAGNOSIS — M6281 Muscle weakness (generalized): Secondary | ICD-10-CM | POA: Diagnosis not present

## 2018-11-10 DIAGNOSIS — M25512 Pain in left shoulder: Secondary | ICD-10-CM | POA: Diagnosis not present

## 2018-11-10 NOTE — Therapy (Addendum)
[facility] [place], [place], [address] Phone: [phone]   Fax:  [phone]  Physical Therapy Treatment  PHYSICAL THERAPY DISCHARGE SUMMARY  Visits from Start of Care: 26  Current functional level related to goals / functional outcomes: See below   Remaining deficits: See goals   Education / Equipment: HEP  Plan: Patient agrees to discharge.  Patient goals were partially met. Patient is being discharged due to financial reasons.  ?????    Gabriela Eves, PT, DPT 01/07/19   Patient Details  Name: Cassidy Barber MRN: [id number] Date of Birth: May 29, 1951 Referring Provider (PT): Jenetta Loges, [place]   Encounter Date: 11/10/2018  PT End of Session - 11/10/18 0911    Visit Number  26    Number of Visits  42    Date for PT Re-Evaluation  11/21/18    Authorization Type  FOTO every 5th visit, Progress note every 10th visit    PT Start Time  0905    PT Stop Time  0945    PT Time Calculation (min)  40 min    Activity Tolerance  Patient tolerated treatment well    Behavior During Therapy  [facility] for tasks assessed/performed       Past Medical History:  Diagnosis Date  . Anemia of chronic disease 10/26/2013  . Hernia of unspecified site of abdominal cavity without mention of obstruction or gangrene   . Hypertension   . Liver disease   . Lynch syndrome   . Lynch syndrome   . Primary biliary cirrhosis ([place]) 10/26/2013  . Thyroid disease     Past Surgical History:  Procedure Laterality Date  . ABDOMINAL HERNIA REPAIR     Patient's states that she has had 8- 9 hernia surgeries  . ABDOMINAL HYSTERECTOMY    . CHOLECYSTECTOMY  2007  . COLON RESECTION    . COLON SURGERY  2008   Done at [facility]  . COLONOSCOPY     Done at [facility]  . ESOPHAGOGASTRODUODENOSCOPY N/A 08/21/2018   Procedure: ESOPHAGOGASTRODUODENOSCOPY (EGD);  Surgeon: Rogene Houston, MD;  [facility];  Service: Endoscopy;  Laterality: N/A;   . EYE SURGERY     lasix  . FLEXIBLE SIGMOIDOSCOPY N/A 10/20/2015   Procedure: FLEXIBLE SIGMOIDOSCOPY;  Surgeon: Rogene Houston, MD;  [facility];  Service: Endoscopy;  Laterality: N/A;  20 - Dr Laural Golden has meeting until 1:00  . FLEXIBLE SIGMOIDOSCOPY N/A 07/11/2016   Procedure: FLEXIBLE SIGMOIDOSCOPY;  Surgeon: Rogene Houston, MD;  [facility];  Service: Endoscopy;  Laterality: N/A;  1200  . FLEXIBLE SIGMOIDOSCOPY N/A 08/09/2017   Procedure: FLEXIBLE SIGMOIDOSCOPY;  Surgeon: Rogene Houston, MD;  [facility];  Service: Endoscopy;  Laterality: N/A;  1:00  . FLEXIBLE SIGMOIDOSCOPY N/A 08/21/2018   Procedure: FLEXIBLE SIGMOIDOSCOPY;  Surgeon: Rogene Houston, MD;  [facility];  Service: Endoscopy;  Laterality: N/A;  . HERNIA REPAIR    . LIVER TRANSPLANT  [id number]  . multiple skin cancers removed    . POLYPECTOMY  08/09/2017   Procedure: POLYPECTOMY;  Surgeon: Rogene Houston, MD;  [facility];  Service: Endoscopy;;  colon small bowel  . REVERSE SHOULDER ARTHROPLASTY Left 07/17/2018  . REVERSE SHOULDER ARTHROPLASTY Left 07/17/2018   Procedure: LEFT REVERSE SHOULDER ARTHROPLASTY;  Surgeon: Justice Britain, MD;  Location: [place];  Service: Orthopedics;  Laterality: Left;  182mn  . SPLENECTOMY  2006  . TYMPANOSTOMY TUBE PLACEMENT    .  UPPER GASTROINTESTINAL ENDOSCOPY     Done at [facility]    There were no vitals filed for this visit.  Subjective Assessment - 11/10/18 0904    Subjective  Reports that she is doing good but doesn't think she will be back to normal.    Pertinent History  L reverse total shoulder replacement 07/17/18, OA    Limitations  Lifting;House hold activities    Patient Stated Goals  use arm again and return to gardening    Currently in Pain?  No/denies         [facility] PT Assessment - 11/10/18 0001      Assessment   Medical Diagnosis  left reverse total shoulder replacement    Referring Provider (PT)  Jenetta Loges,  PA-C    Onset Date/Surgical Date  07/17/18    Hand Dominance  Right    Next MD Visit  March    Prior Therapy  yes      Precautions   Precautions  Shoulder                   [facility] Adult PT Treatment/Exercise - 11/10/18 0001      Shoulder Exercises: Standing   Other Standing Exercises  functional flexion from elevated surface to first shelf x3 minutes      Shoulder Exercises: Pulleys   Flexion  5 minutes    Other Pulley Exercises  Wall ladder x4 min to level 26      Shoulder Exercises: ROM/Strengthening   UBE (Upper Arm Bike)  60 RPM 10 min 5 fwd, 5 bwd      Manual Therapy   Manual Therapy  Passive ROM    Passive ROM  PROM of L shoulder into flex, ER, IR at 90 ABD with gentle holds at end range                  PT Long Term Goals - 10/29/18 0911      PT LONG TERM GOAL #1   Title  Patient will be independent with HEP and its progression    Time  6    Period  Weeks    Status  Achieved      PT LONG TERM GOAL #2   Title  Patient will demonstrate 130+ degrees of left shoulder flexion AROM to improve ability to perform functional tasks    Time  6    Period  Weeks    Status  On-going      PT LONG TERM GOAL #3   Title  Patient will demonstrate 65+ degrees of left ER AROM to don/doff apparel.    Time  6    Period  Weeks    Status  On-going      PT LONG TERM GOAL #4   Title  Patient will demonstrate 4/5 or greater left shoulder MMT in all planes to improve stability during functional tasks.    Time  6    Period  Weeks    Status  On-going      PT LONG TERM GOAL #5   Title  Patient will report ability to perform all ADLs independently with less than 4/10 left shoulder pain.    Time  6    Period  Weeks    Status  Achieved            Plan - 11/10/18 0954    Clinical Impression Statement  Patient tolerated today's treatment well with no complaints of pain in L shoulder.  Patient able to purchase a wall ladder that she has been using at home.  Patient able to reach lower cabinet easier today per patient report. Firm end feels and smooth arc of motion noted in all directions of PROM assessed. Patient more limited in shoulder flexion still at this time. No modalities per patient request and no pain.    Rehab Potential  Good    PT Frequency  3x / week    PT Duration  6 weeks    PT Treatment/Interventions  ADLs/Self Care Home Management;Moist Heat;Electrical Stimulation;Cryotherapy;Patient/family education;Therapeutic exercise;Therapeutic activities;Neuromuscular re-education;Passive range of motion;Manual techniques;Vasopneumatic Device;Dry needling;Ultrasound    PT Next Visit Plan  FOTO and D/C summary 11/11/2018.    PT Home Exercise Plan  IR and ER isometric step outs rows, extension    Consulted and Agree with Plan of Care  Patient       Patient will benefit from skilled therapeutic intervention in order to improve the following deficits and impairments:  Decreased activity tolerance, Decreased range of motion, Decreased strength, Pain, Impaired UE functional use  Visit Diagnosis: Muscle weakness (generalized)  Stiffness of left shoulder, not elsewhere classified  Acute pain of left shoulder     Problem List Patient Active Problem List   Diagnosis Date Noted  . Urticaria 09/08/2018  . S/p reverse total shoulder arthroplasty 07/17/2018  . History of colon cancer 06/12/2018  . History of colonic polyps 06/12/2018  . 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  . Gastroesophageal reflux disease 06/12/2018  . Chronic diarrhea 06/26/2017  . Iron deficiency anemia 06/26/2017  . GAD (generalized anxiety disorder) 08/28/2016  . Vitamin D deficiency 08/28/2016  . GERD (gastroesophageal reflux disease) 08/28/2016  . Insomnia 08/28/2016  . Convulsions ([place]) 11/30/2015  . Liver transplant recipient [facility]) 11/30/2015  . Seizures ([place]) 09/27/2015  . Essential hypertension, benign 03/10/2015  . Hx of liver transplant ([place]) 12/08/2014  . Diarrhea  12/08/2014  . Colon cancer ([place]) 12/08/2014  . Hepatic encephalopathy ([place]) 10/26/2013  . Anemia of chronic disease 10/26/2013  . Personal history of colonic polyps 12/27/2012  . PBC (primary biliary cirrhosis) 03/24/2012  . Hypothyroidism 03/24/2012  . Ventral hernia, recurrent 03/24/2012    Standley Brooking, PTA 11/10/2018, 9:59 AM  [facility] [address] [place], [place], [address] Phone: [phone]   Fax:  [phone]  Name: Cassidy Barber MRN: [id number] Date of Birth: [birth year]

## 2018-11-11 ENCOUNTER — Encounter: Payer: Medicare Other | Admitting: Physical Therapy

## 2018-11-11 ENCOUNTER — Ambulatory Visit (HOSPITAL_COMMUNITY)
Admission: RE | Admit: 2018-11-11 | Discharge: 2018-11-11 | Disposition: A | Discharge status: Home / Self Care | Payer: Medicare Other | Source: Ambulatory Visit | Attending: Family | Admitting: Family

## 2018-11-11 DIAGNOSIS — Z8719 Personal history of other diseases of the digestive system: Secondary | ICD-10-CM | POA: Insufficient documentation

## 2018-11-11 DIAGNOSIS — N281 Cyst of kidney, acquired: Secondary | ICD-10-CM | POA: Diagnosis not present

## 2018-11-11 DIAGNOSIS — R1031 Right lower quadrant pain: Secondary | ICD-10-CM | POA: Diagnosis not present

## 2018-11-11 MED ORDER — IOPAMIDOL (ISOVUE-300) INJECTION 61%
80.0000 mL | Freq: Once | INTRAVENOUS | Status: AC | PRN
  Administered 2018-11-11: 100 mL via INTRAVENOUS

## 2018-11-12 ENCOUNTER — Other Ambulatory Visit: Payer: Self-pay | Admitting: Physician Assistant

## 2018-11-12 ENCOUNTER — Other Ambulatory Visit: Payer: Self-pay | Admitting: Family

## 2018-11-12 DIAGNOSIS — F411 Generalized anxiety disorder: Secondary | ICD-10-CM

## 2018-11-12 DIAGNOSIS — L570 Actinic keratosis: Secondary | ICD-10-CM | POA: Diagnosis not present

## 2018-11-13 NOTE — Telephone Encounter (Signed)
Last filled 10/12/18, [facility] pt

## 2018-11-13 NOTE — Telephone Encounter (Signed)
Patient needs to be seen and needs an appointment for this because as far as I can tell she has not been seen for a visit to talk about this in quite some time, she is only come in for other acute visit, this needs to be with her PCP next week.

## 2018-11-14 NOTE — Telephone Encounter (Signed)
Pt aware she will get refills at appt 11/17/18 with [facility].

## 2018-11-17 ENCOUNTER — Encounter: Payer: Self-pay | Admitting: Family

## 2018-11-17 ENCOUNTER — Ambulatory Visit (INDEPENDENT_AMBULATORY_CARE_PROVIDER_SITE_OTHER): Payer: Medicare Other | Admitting: Family

## 2018-11-17 VITALS — BP 137/78 | HR 64 | Temp 97.8°F | Ht 62.5 in | Wt 149.0 lb

## 2018-11-17 DIAGNOSIS — D508 Other iron deficiency anemias: Secondary | ICD-10-CM | POA: Diagnosis not present

## 2018-11-17 DIAGNOSIS — Z944 Liver transplant status: Secondary | ICD-10-CM

## 2018-11-17 DIAGNOSIS — K591 Functional diarrhea: Secondary | ICD-10-CM

## 2018-11-17 DIAGNOSIS — D638 Anemia in other chronic diseases classified elsewhere: Secondary | ICD-10-CM | POA: Diagnosis not present

## 2018-11-17 DIAGNOSIS — E039 Hypothyroidism, unspecified: Secondary | ICD-10-CM | POA: Diagnosis not present

## 2018-11-17 DIAGNOSIS — I10 Essential (primary) hypertension: Secondary | ICD-10-CM | POA: Diagnosis not present

## 2018-11-17 DIAGNOSIS — K219 Gastro-esophageal reflux disease without esophagitis: Secondary | ICD-10-CM | POA: Diagnosis not present

## 2018-11-17 DIAGNOSIS — F411 Generalized anxiety disorder: Secondary | ICD-10-CM

## 2018-11-17 MED ORDER — ALPRAZOLAM 0.5 MG PO TABS: 0.5000 mg | ORAL_TABLET | Freq: Every evening | ORAL | 5 refills | Status: DC | PRN

## 2018-11-17 NOTE — Progress Notes (Signed)
Subjective:    Patient ID: Cassidy Barber, female    DOB: [birth year], 68 y.o.   MRN: [id number]  Chief Complaint  Patient presents with  . Anxiety medication refills   PT presents to the office today for chronic follow up. She is followed by [facility] for hx of liver transplant. She had an incarcerated ventral hernia with fluid  In the hernia sac 04/05/18. She had Emergency surgery.  She had a left reverse shoulder 07/17/18 and doing well.   Anxiety  Presents for follow-up visit. Symptoms include depressed mood, excessive worry, irritability, nervous/anxious behavior and restlessness. Patient reports no shortness of breath. Symptoms occur most days. The severity of symptoms is moderate. The quality of sleep is good.    Hypertension  This is a chronic problem. The current episode started more than 1 year ago. The problem has been waxing and waning since onset. The problem is uncontrolled. Associated symptoms include anxiety and peripheral edema. Pertinent negatives include no malaise/fatigue or shortness of breath. The current treatment provides moderate improvement. Identifiable causes of hypertension include a thyroid problem.  Gastroesophageal Reflux  She complains of heartburn. She reports no belching or no coughing. This is a chronic problem. The current episode started more than 1 year ago. The problem occurs occasionally. Associated symptoms include fatigue. She has tried a PPI for the symptoms. The treatment provided moderate relief.  Thyroid Problem  Presents for follow-up visit. Symptoms include anxiety, depressed mood, diarrhea and fatigue. The symptoms have been stable.  Diarrhea   This is a chronic problem. The current episode started more than 1 year ago. The problem occurs 2 to 4 times per day. The problem has been waxing and waning. Pertinent negatives include no coughing.      Review of Systems  Constitutional: Positive for fatigue and irritability. Negative for  malaise/fatigue.  Respiratory: Negative for cough and shortness of breath.   Gastrointestinal: Positive for diarrhea and heartburn.  Psychiatric/Behavioral: The patient is nervous/anxious.   All other systems reviewed and are negative.      Objective:   Physical Exam Vitals signs reviewed.  Constitutional:      General: She is not in acute distress.    Appearance: She is well-developed.  HENT:     Head: Normocephalic and atraumatic.     Right Ear: Tympanic membrane normal.     Left Ear: Tympanic membrane normal.  Eyes:     Pupils: Pupils are equal, round, and reactive to light.  Neck:     Musculoskeletal: Normal range of motion and neck supple.     Thyroid: No thyromegaly.  Cardiovascular:     Rate and Rhythm: Normal rate and regular rhythm.     Heart sounds: Normal heart sounds. No murmur.  Pulmonary:     Effort: Pulmonary effort is normal. No respiratory distress.     Breath sounds: Normal breath sounds. No wheezing.  Abdominal:     General: Bowel sounds are normal. There is no distension.     Palpations: Abdomen is soft.     Tenderness: There is no abdominal tenderness.  Musculoskeletal: Normal range of motion.        General: No tenderness.  Skin:    General: Skin is warm and dry.  Neurological:     Mental Status: She is alert and oriented to person, place, and time.     Cranial Nerves: No cranial nerve deficit.     Deep Tendon Reflexes: Reflexes are normal and symmetric.  Psychiatric:  Behavior: Behavior normal.        Thought Content: Thought content normal.        Judgment: Judgment normal.       BP 137/78   Pulse 64   Temp 97.8 F (36.6 C) (Oral)   Ht 5' 2.5" (1.588 m)   Wt 149 lb (67.6 kg)   BMI 26.82 kg/m      Assessment & Plan:  Cassidy Barber comes in today with chief complaint of Anxiety medication refills   Diagnosis and orders addressed:  1. Essential hypertension, benign - CMP14+EGFR  2. Gastroesophageal reflux disease,  esophagitis presence not specified - CMP14+EGFR  3. Acquired hypothyroidism - CMP14+EGFR - TSH  4. Anemia of chronic disease - Anemia Profile B - CMP14+EGFR  5. Hx of liver transplant ([place]) - CMP14+EGFR  6. Functional diarrhea - CMP14+EGFR  7. Liver transplant recipient [facility]) - CMP14+EGFR  8. GAD (generalized anxiety disorder) Stress management discussed - CMP14+EGFR - ALPRAZolam (XANAX) 0.5 MG tablet; Take 1 tablet (0.5 mg total) by mouth at bedtime as needed.  Dispense: 30 tablet; Refill: 5  9. Other iron deficiency anemia - Anemia Profile B - CMP14+EGFR   Labs pending Pt reviewed in [place] controlled database- No red flags  Health Maintenance reviewed Diet and exercise encouraged  Follow up plan: 6 months    [name], FNP

## 2018-11-17 NOTE — Patient Instructions (Signed)
Diarrhea, Adult  Diarrhea is frequent loose and watery bowel movements. Diarrhea can make you feel weak and cause you to become dehydrated. Dehydration can make you tired and thirsty, cause you to have a dry mouth, and decrease how often you urinate.  Diarrhea typically lasts 2-3 days. However, it can last longer if it is a sign of something more serious. It is important to treat your diarrhea as told by your health care provider.  Follow these instructions at home:  Eating and drinking         Follow these recommendations as told by your health care provider:  · Take an oral rehydration solution (ORS). This is an over-the-counter medicine that helps return your body to its normal balance of nutrients and water. It is found at pharmacies and retail stores.  · Drink plenty of fluids, such as water, ice chips, diluted fruit juice, and low-calorie sports drinks. You can drink milk also, if desired.  · Avoid drinking fluids that contain a lot of sugar or caffeine, such as energy drinks, sports drinks, and soda.  · Eat bland, easy-to-digest foods in small amounts as you are able. These foods include bananas, applesauce, rice, lean meats, toast, and crackers.  · Avoid alcohol.  · Avoid spicy or fatty foods.    Medicines  · Take over-the-counter and prescription medicines only as told by your health care provider.  · If you were prescribed an antibiotic medicine, take it as told by your health care provider. Do not stop using the antibiotic even if you start to feel better.  General instructions    · Wash your hands often using soap and water. If soap and water are not available, use a hand sanitizer. Others in the household should wash their hands as well. Hands should be washed:  ? After using the toilet or changing a diaper.  ? Before preparing, cooking, or serving food.  ? While caring for a sick person or while visiting someone in a hospital.  · Drink enough fluid to keep your urine pale yellow.  · Rest at home while  you recover.  · Watch your condition for any changes.  · Take a warm bath to relieve any burning or pain from frequent diarrhea episodes.  · Keep all follow-up visits as told by your health care provider. This is important.  Contact a health care provider if:  · You have a fever.  · Your diarrhea gets worse.  · You have new symptoms.  · You cannot keep fluids down.  · You feel light-headed or dizzy.  · You have a headache.  · You have muscle cramps.  Get help right away if:  · You have chest pain.  · You feel extremely weak or you faint.  · You have bloody or black stools or stools that look like tar.  · You have severe pain, cramping, or bloating in your abdomen.  · You have trouble breathing or you are breathing very quickly.  · Your heart is beating very quickly.  · Your skin feels cold and clammy.  · You feel confused.  · You have signs of dehydration, such as:  ? Dark urine, very little urine, or no urine.  ? Cracked lips.  ? Dry mouth.  ? Sunken eyes.  ? Sleepiness.  ? Weakness.  Summary  · Diarrhea is frequent loose and watery bowel movements. Diarrhea can make you feel weak and cause you to become dehydrated.  · Drink enough fluids   to keep your urine pale yellow.  · Make sure that you wash your hands after using the toilet. If soap and water are not available, use hand sanitizer.  · Contact a health care provider if your diarrhea gets worse or you have new symptoms.  · Get help right away if you have signs of dehydration.  This information is not intended to replace advice given to you by your health care provider. Make sure you discuss any questions you have with your health care provider.  Document Released: 10/19/2002 Document Revised: 04/04/2018 Document Reviewed: 04/04/2018  Elsevier Interactive Patient Education © 2019 [employer]

## 2018-11-18 LAB — CMP14+EGFR
ALT: 18 IU/L (ref 0–32)
AST: 23 IU/L (ref 0–40)
Albumin/Globulin Ratio: 1.6 (ref 1.2–2.2)
Albumin: 4 g/dL (ref 3.6–4.8)
Alkaline Phosphatase: 140 IU/L — ABNORMAL HIGH (ref 39–117)
BUN/Creatinine Ratio: 22 (ref 12–28)
BUN: 18 mg/dL (ref 8–27)
Bilirubin Total: <0.2 mg/dL (ref 0.0–1.2)
CO2: 23 mmol/L (ref 20–29)
Calcium: 8.9 mg/dL (ref 8.7–10.3)
Chloride: 103 mmol/L (ref 96–106)
Creatinine, Ser: 0.81 mg/dL (ref 0.57–1.00)
GFR calc Af Amer: 87 mL/min/{1.73_m2} (ref >59)
GFR calc non Af Amer: 75 mL/min/{1.73_m2} (ref >59)
Globulin, Total: 2.5 g/dL (ref 1.5–4.5)
Glucose: 91 mg/dL (ref 65–99)
Potassium: 4.4 mmol/L (ref 3.5–5.2)
Sodium: 141 mmol/L (ref 134–144)
Total Protein: 6.5 g/dL (ref 6.0–8.5)

## 2018-11-18 LAB — TSH: TSH: 0.999 u[IU]/mL (ref 0.450–4.500)

## 2018-11-18 LAB — ANEMIA PROFILE B
Basophils Absolute: 0.1 10*3/uL (ref 0.0–0.2)
Basos: 2 %
EOS (ABSOLUTE): 0.3 10*3/uL (ref 0.0–0.4)
Eos: 4 %
FOLATE: 15.3 ng/mL (ref >3.0)
Ferritin: 142 ng/mL (ref 15–150)
Hematocrit: 35 % (ref 34.0–46.6)
Hemoglobin: 11.4 g/dL (ref 11.1–15.9)
Immature Grans (Abs): 0.1 10*3/uL (ref 0.0–0.1)
Immature Granulocytes: 1 %
Iron Saturation: 25 % (ref 15–55)
Iron: 66 ug/dL (ref 27–139)
Lymphocytes Absolute: 2.2 10*3/uL (ref 0.7–3.1)
Lymphs: 32 %
MCH: 28.5 pg (ref 26.6–33.0)
MCHC: 32.6 g/dL (ref 31.5–35.7)
MCV: 88 fL (ref 79–97)
Monocytes Absolute: 0.9 10*3/uL (ref 0.1–0.9)
Monocytes: 14 %
Neutrophils Absolute: 3.3 10*3/uL (ref 1.4–7.0)
Neutrophils: 47 %
Platelets: 334 10*3/uL (ref 150–450)
RBC: 4 x10E6/uL (ref 3.77–5.28)
RDW: 16.3 % — ABNORMAL HIGH (ref 11.7–15.4)
Retic Ct Pct: 1.5 % (ref 0.6–2.6)
TIBC: 259 ug/dL (ref 250–450)
UIBC: 193 ug/dL (ref 118–369)
Vitamin B-12: 1683 pg/mL — ABNORMAL HIGH (ref 232–1245)
WBC: 6.9 10*3/uL (ref 3.4–10.8)

## 2018-11-23 ENCOUNTER — Other Ambulatory Visit: Payer: Self-pay | Admitting: Family

## 2018-11-24 ENCOUNTER — Other Ambulatory Visit: Payer: Medicare Other

## 2018-11-24 DIAGNOSIS — Z298 Encounter for other specified prophylactic measures: Secondary | ICD-10-CM

## 2018-11-25 LAB — CBC WITH DIFFERENTIAL/PLATELET
Basophils Absolute: 0.2 10*3/uL (ref 0.0–0.2)
Basos: 3 %
EOS (ABSOLUTE): 0.4 10*3/uL (ref 0.0–0.4)
Eos: 7 %
Hematocrit: 36.9 % (ref 34.0–46.6)
Hemoglobin: 11.6 g/dL (ref 11.1–15.9)
Immature Grans (Abs): 0 10*3/uL (ref 0.0–0.1)
Immature Granulocytes: 0 %
Lymphocytes Absolute: 2 10*3/uL (ref 0.7–3.1)
Lymphs: 32 %
MCH: 27.6 pg (ref 26.6–33.0)
MCHC: 31.4 g/dL — ABNORMAL LOW (ref 31.5–35.7)
MCV: 88 fL (ref 79–97)
Monocytes Absolute: 0.9 10*3/uL (ref 0.1–0.9)
Monocytes: 15 %
Neutrophils Absolute: 2.6 10*3/uL (ref 1.4–7.0)
Neutrophils: 43 %
Platelets: 310 10*3/uL (ref 150–450)
RBC: 4.2 x10E6/uL (ref 3.77–5.28)
RDW: 15.7 % — ABNORMAL HIGH (ref 11.7–15.4)
WBC: 6.1 10*3/uL (ref 3.4–10.8)

## 2018-11-25 LAB — COMPREHENSIVE METABOLIC PANEL
ALT: 24 IU/L (ref 0–32)
AST: 25 IU/L (ref 0–40)
Albumin/Globulin Ratio: 1.7 (ref 1.2–2.2)
Albumin: 3.9 g/dL (ref 3.6–4.8)
Alkaline Phosphatase: 168 IU/L — ABNORMAL HIGH (ref 39–117)
BUN/Creatinine Ratio: 16 (ref 12–28)
BUN: 14 mg/dL (ref 8–27)
Bilirubin Total: <0.2 mg/dL (ref 0.0–1.2)
CHLORIDE: 103 mmol/L (ref 96–106)
CO2: 25 mmol/L (ref 20–29)
Calcium: 8.8 mg/dL (ref 8.7–10.3)
Creatinine, Ser: 0.87 mg/dL (ref 0.57–1.00)
GFR calc Af Amer: 80 mL/min/{1.73_m2} (ref >59)
GFR calc non Af Amer: 69 mL/min/{1.73_m2} (ref >59)
GLUCOSE: 94 mg/dL (ref 65–99)
Globulin, Total: 2.3 g/dL (ref 1.5–4.5)
Potassium: 4.4 mmol/L (ref 3.5–5.2)
Sodium: 141 mmol/L (ref 134–144)
Total Protein: 6.2 g/dL (ref 6.0–8.5)

## 2018-11-25 LAB — EVEROLIMUS: EVEROLIMUS: 6.1 ng/mL (ref 3.0–8.0)

## 2018-11-25 LAB — TACROLIMUS LEVEL: Tacrolimus Lvl: NOT DETECTED ng/mL (ref 2.0–20.0)

## 2018-11-25 LAB — MAGNESIUM: Magnesium: 2.1 mg/dL (ref 1.6–2.3)

## 2018-11-26 ENCOUNTER — Other Ambulatory Visit: Payer: Self-pay | Admitting: *Deleted

## 2018-11-26 MED ORDER — LEVOTHYROXINE SODIUM 125 MCG PO TABS: 125.0000 ug | ORAL_TABLET | Freq: Every day | ORAL | 2 refills | Status: DC

## 2018-12-02 DIAGNOSIS — D485 Neoplasm of uncertain behavior of skin: Secondary | ICD-10-CM | POA: Diagnosis not present

## 2018-12-02 DIAGNOSIS — L570 Actinic keratosis: Secondary | ICD-10-CM | POA: Diagnosis not present

## 2018-12-13 DIAGNOSIS — Z944 Liver transplant status: Secondary | ICD-10-CM | POA: Diagnosis not present

## 2018-12-24 DIAGNOSIS — L259 Unspecified contact dermatitis, unspecified cause: Secondary | ICD-10-CM | POA: Diagnosis not present

## 2019-01-02 DIAGNOSIS — Z1231 Encounter for screening mammogram for malignant neoplasm of breast: Secondary | ICD-10-CM | POA: Diagnosis not present

## 2019-01-02 DIAGNOSIS — Z803 Family history of malignant neoplasm of breast: Secondary | ICD-10-CM | POA: Diagnosis not present

## 2019-01-02 LAB — HM MAMMOGRAPHY

## 2019-01-12 ENCOUNTER — Other Ambulatory Visit: Payer: Medicare Other

## 2019-01-12 DIAGNOSIS — Z944 Liver transplant status: Secondary | ICD-10-CM | POA: Diagnosis not present

## 2019-01-12 DIAGNOSIS — Z298 Encounter for other specified prophylactic measures: Secondary | ICD-10-CM | POA: Diagnosis not present

## 2019-01-13 DIAGNOSIS — Z944 Liver transplant status: Secondary | ICD-10-CM | POA: Diagnosis not present

## 2019-01-15 ENCOUNTER — Encounter: Payer: Self-pay | Admitting: Family

## 2019-01-15 ENCOUNTER — Ambulatory Visit (INDEPENDENT_AMBULATORY_CARE_PROVIDER_SITE_OTHER): Payer: Medicare Other | Admitting: Family

## 2019-01-15 VITALS — BP 136/81 | HR 69 | Temp 96.7°F | Ht 62.5 in | Wt 152.0 lb

## 2019-01-15 DIAGNOSIS — J019 Acute sinusitis, unspecified: Secondary | ICD-10-CM

## 2019-01-15 MED ORDER — AMOXICILLIN-POT CLAVULANATE 875-125 MG PO TABS: 1.0000 | ORAL_TABLET | Freq: Two times a day (BID) | ORAL | 0 refills | Status: DC

## 2019-01-15 NOTE — Progress Notes (Signed)
Subjective:    Patient ID: Cassidy Barber, female    DOB: 01-14-1951, 68 y.o.   MRN: [id number]  Chief Complaint  Patient presents with  . Cough    head congestion    Cough  Associated symptoms include ear pain, rhinorrhea and a sore throat.  Otalgia   There is pain in both ears. This is a new problem. The current episode started 1 to 4 weeks ago. The problem occurs constantly. The problem has been gradually worsening. The pain is mild. Associated symptoms include coughing, hearing loss, rhinorrhea and a sore throat. Treatments tried: claritin. The treatment provided mild relief.      Review of Systems  HENT: Positive for ear pain, hearing loss, rhinorrhea and sore throat.   Respiratory: Positive for cough.   All other systems reviewed and are negative.      Objective:   Physical Exam Vitals signs reviewed.  Constitutional:      General: She is not in acute distress.    Appearance: She is well-developed.  HENT:     Head: Normocephalic and atraumatic.     Right Ear: A middle ear effusion is present. Tympanic membrane is bulging.     Left Ear: A middle ear effusion is present.     Nose: Mucosal edema present.     Mouth/Throat:     Pharynx: Posterior oropharyngeal erythema present.  Eyes:     Pupils: Pupils are equal, round, and reactive to light.  Neck:     Musculoskeletal: Normal range of motion and neck supple.     Thyroid: No thyromegaly.  Cardiovascular:     Rate and Rhythm: Normal rate and regular rhythm.     Heart sounds: Normal heart sounds. No murmur.  Pulmonary:     Effort: Pulmonary effort is normal. No respiratory distress.     Breath sounds: Normal breath sounds. No wheezing.  Abdominal:     General: Bowel sounds are normal. There is no distension.     Palpations: Abdomen is soft.     Tenderness: There is no abdominal tenderness.  Musculoskeletal: Normal range of motion.        General: No tenderness.  Skin:    General: Skin is warm and dry.    Neurological:     Mental Status: She is alert and oriented to person, place, and time.     Cranial Nerves: No cranial nerve deficit.     Deep Tendon Reflexes: Reflexes are normal and symmetric.  Psychiatric:        Behavior: Behavior normal.        Thought Content: Thought content normal.        Judgment: Judgment normal.          BP 136/81   Pulse 69   Temp (!) 96.7 F (35.9 C) (Oral)   Ht 5' 2.5" (1.588 m)   Wt 152 lb (68.9 kg)   BMI 27.36 kg/m   Assessment & Plan:  Cassidy Barber comes in today with chief complaint of Cough (head congestion)   Diagnosis and orders addressed:  1. Acute non-recurrent sinusitis, unspecified location - Take meds as prescribed - Use a cool mist humidifier  -Use saline nose sprays frequently -Force fluids -For any cough or congestion  Use plain Mucinex- regular strength or max strength is fine -For fever or aces or pains- take tylenol or ibuprofen. -Throat lozenges if help -New toothbrush in 3 days RTO if symptoms worsens or does not improve - amoxicillin-clavulanate (AUGMENTIN)  875-125 MG tablet; Take 1 tablet by mouth 2 (two) times daily.  Dispense: 14 tablet; Refill: 0   [name], FNP

## 2019-01-15 NOTE — Patient Instructions (Signed)
Sinusitis, Adult  Sinusitis is inflammation of your sinuses. Sinuses are hollow spaces in the bones around your face. Your sinuses are located:   Around your eyes.   In the middle of your forehead.   Behind your nose.   In your cheekbones.  Mucus normally drains out of your sinuses. When your nasal tissues become inflamed or swollen, mucus can become trapped or blocked. This allows bacteria, viruses, and fungi to grow, which leads to infection. Most infections of the sinuses are caused by a virus.  Sinusitis can develop quickly. It can last for up to 4 weeks (acute) or for more than 12 weeks (chronic). Sinusitis often develops after a cold.  What are the causes?  This condition is caused by anything that creates swelling in the sinuses or stops mucus from draining. This includes:   Allergies.   Asthma.   Infection from bacteria or viruses.   Deformities or blockages in your nose or sinuses.   Abnormal growths in the nose (nasal polyps).   Pollutants, such as chemicals or irritants in the air.   Infection from fungi (rare).  What increases the risk?  You are more likely to develop this condition if you:   Have a weak body defense system (immune system).   Do a lot of swimming or diving.   Overuse nasal sprays.   Smoke.  What are the signs or symptoms?  The main symptoms of this condition are pain and a feeling of pressure around the affected sinuses. Other symptoms include:   Stuffy nose or congestion.   Thick drainage from your nose.   Swelling and warmth over the affected sinuses.   Headache.   Upper toothache.   A cough that may get worse at night.   Extra mucus that collects in the throat or the back of the nose (postnasal drip).   Decreased sense of smell and taste.   Fatigue.   A fever.   Sore throat.   Bad breath.  How is this diagnosed?  This condition is diagnosed based on:   Your symptoms.   Your medical history.   A physical exam.   Tests to find out if your condition is  acute or chronic. This may include:  ? Checking your nose for nasal polyps.  ? Viewing your sinuses using a device that has a light (endoscope).  ? Testing for allergies or bacteria.  ? Imaging tests, such as an MRI or CT scan.  In rare cases, a bone biopsy may be done to rule out more serious types of fungal sinus disease.  How is this treated?  Treatment for sinusitis depends on the cause and whether your condition is chronic or acute.   If caused by a virus, your symptoms should go away on their own within 10 days. You may be given medicines to relieve symptoms. They include:  ? Medicines that shrink swollen nasal passages (topical intranasal decongestants).  ? Medicines that treat allergies (antihistamines).  ? A spray that eases inflammation of the nostrils (topical intranasal corticosteroids).  ? Rinses that help get rid of thick mucus in your nose (nasal saline washes).   If caused by bacteria, your health care provider may recommend waiting to see if your symptoms improve. Most bacterial infections will get better without antibiotic medicine. You may be given antibiotics if you have:  ? A severe infection.  ? A weak immune system.   If caused by narrow nasal passages or nasal polyps, you may need   to have surgery.  Follow these instructions at home:  Medicines   Take, use, or apply over-the-counter and prescription medicines only as told by your health care provider. These may include nasal sprays.   If you were prescribed an antibiotic medicine, take it as told by your health care provider. Do not stop taking the antibiotic even if you start to feel better.  Hydrate and humidify     Drink enough fluid to keep your urine pale yellow. Staying hydrated will help to thin your mucus.   Use a cool mist humidifier to keep the humidity level in your home above 50%.   Inhale steam for 10-15 minutes, 3-4 times a day, or as told by your health care provider. You can do this in the bathroom while a hot shower is  running.   Limit your exposure to cool or dry air.  Rest   Rest as much as possible.   Sleep with your head raised (elevated).   Make sure you get enough sleep each night.  General instructions     Apply a warm, moist washcloth to your face 3-4 times a day or as told by your health care provider. This will help with discomfort.   Wash your hands often with soap and water to reduce your exposure to germs. If soap and water are not available, use hand sanitizer.   Do not smoke. Avoid being around people who are smoking (secondhand smoke).   Keep all follow-up visits as told by your health care provider. This is important.  Contact a health care provider if:   You have a fever.   Your symptoms get worse.   Your symptoms do not improve within 10 days.  Get help right away if:   You have a severe headache.   You have persistent vomiting.   You have severe pain or swelling around your face or eyes.   You have vision problems.   You develop confusion.   Your neck is stiff.   You have trouble breathing.  Summary   Sinusitis is soreness and inflammation of your sinuses. Sinuses are hollow spaces in the bones around your face.   This condition is caused by nasal tissues that become inflamed or swollen. The swelling traps or blocks the flow of mucus. This allows bacteria, viruses, and fungi to grow, which leads to infection.   If you were prescribed an antibiotic medicine, take it as told by your health care provider. Do not stop taking the antibiotic even if you start to feel better.   Keep all follow-up visits as told by your health care provider. This is important.  This information is not intended to replace advice given to you by your health care provider. Make sure you discuss any questions you have with your health care provider.  Document Released: 10/29/2005 Document Revised: 03/31/2018 Document Reviewed: 03/31/2018  Elsevier Interactive Patient Education  2019 [employer]

## 2019-01-19 ENCOUNTER — Other Ambulatory Visit: Payer: Self-pay | Admitting: Family

## 2019-01-21 ENCOUNTER — Other Ambulatory Visit: Payer: Self-pay | Admitting: Family

## 2019-01-29 ENCOUNTER — Encounter (INDEPENDENT_AMBULATORY_CARE_PROVIDER_SITE_OTHER): Payer: Self-pay

## 2019-02-02 ENCOUNTER — Encounter (INDEPENDENT_AMBULATORY_CARE_PROVIDER_SITE_OTHER): Payer: Self-pay

## 2019-02-02 ENCOUNTER — Other Ambulatory Visit: Payer: Self-pay | Admitting: Family

## 2019-02-02 DIAGNOSIS — I10 Essential (primary) hypertension: Secondary | ICD-10-CM

## 2019-02-02 MED ORDER — LOSARTAN POTASSIUM 25 MG PO TABS: 25.0000 mg | ORAL_TABLET | Freq: Every day | ORAL | 0 refills | Status: DC

## 2019-02-02 MED ORDER — METOPROLOL SUCCINATE ER 25 MG PO TB24: 25.0000 mg | ORAL_TABLET | Freq: Every day | ORAL | 11 refills | Status: DC

## 2019-02-09 MED ORDER — ALENDRONATE SODIUM 70 MG PO TABS: ORAL_TABLET | ORAL | 1 refills | Status: DC

## 2019-02-10 ENCOUNTER — Ambulatory Visit (INDEPENDENT_AMBULATORY_CARE_PROVIDER_SITE_OTHER): Payer: Medicare Other | Admitting: Internal Medicine

## 2019-02-11 DIAGNOSIS — Z944 Liver transplant status: Secondary | ICD-10-CM | POA: Diagnosis not present

## 2019-03-09 DIAGNOSIS — Z944 Liver transplant status: Secondary | ICD-10-CM | POA: Diagnosis not present

## 2019-03-11 ENCOUNTER — Other Ambulatory Visit: Payer: Medicare Other

## 2019-03-11 ENCOUNTER — Other Ambulatory Visit: Payer: Self-pay

## 2019-03-11 DIAGNOSIS — Z944 Liver transplant status: Secondary | ICD-10-CM | POA: Diagnosis not present

## 2019-03-18 DIAGNOSIS — Z944 Liver transplant status: Secondary | ICD-10-CM | POA: Diagnosis not present

## 2019-04-03 ENCOUNTER — Other Ambulatory Visit: Payer: Self-pay | Admitting: Physician Assistant

## 2019-04-03 DIAGNOSIS — L989 Disorder of the skin and subcutaneous tissue, unspecified: Secondary | ICD-10-CM | POA: Diagnosis not present

## 2019-04-03 DIAGNOSIS — D0421 Carcinoma in situ of skin of right ear and external auricular canal: Secondary | ICD-10-CM | POA: Diagnosis not present

## 2019-04-03 DIAGNOSIS — C44529 Squamous cell carcinoma of skin of other part of trunk: Secondary | ICD-10-CM | POA: Diagnosis not present

## 2019-04-03 DIAGNOSIS — L570 Actinic keratosis: Secondary | ICD-10-CM | POA: Diagnosis not present

## 2019-04-09 DIAGNOSIS — Z944 Liver transplant status: Secondary | ICD-10-CM | POA: Diagnosis not present

## 2019-04-10 ENCOUNTER — Other Ambulatory Visit: Payer: Self-pay

## 2019-04-13 ENCOUNTER — Ambulatory Visit (INDEPENDENT_AMBULATORY_CARE_PROVIDER_SITE_OTHER): Payer: Medicare Other | Admitting: Family

## 2019-04-13 ENCOUNTER — Encounter: Payer: Self-pay | Admitting: Family

## 2019-04-13 ENCOUNTER — Other Ambulatory Visit: Payer: Self-pay

## 2019-04-13 VITALS — BP 117/63 | HR 60 | Temp 96.8°F | Ht 62.5 in | Wt 151.8 lb

## 2019-04-13 DIAGNOSIS — I10 Essential (primary) hypertension: Secondary | ICD-10-CM | POA: Diagnosis not present

## 2019-04-13 DIAGNOSIS — F411 Generalized anxiety disorder: Secondary | ICD-10-CM

## 2019-04-13 DIAGNOSIS — F1320 Sedative, hypnotic or anxiolytic dependence, uncomplicated: Secondary | ICD-10-CM | POA: Insufficient documentation

## 2019-04-13 DIAGNOSIS — E039 Hypothyroidism, unspecified: Secondary | ICD-10-CM

## 2019-04-13 DIAGNOSIS — Z944 Liver transplant status: Secondary | ICD-10-CM

## 2019-04-13 DIAGNOSIS — K219 Gastro-esophageal reflux disease without esophagitis: Secondary | ICD-10-CM | POA: Diagnosis not present

## 2019-04-13 DIAGNOSIS — Z79899 Other long term (current) drug therapy: Secondary | ICD-10-CM | POA: Insufficient documentation

## 2019-04-13 DIAGNOSIS — D508 Other iron deficiency anemias: Secondary | ICD-10-CM | POA: Diagnosis not present

## 2019-04-13 DIAGNOSIS — G4700 Insomnia, unspecified: Secondary | ICD-10-CM

## 2019-04-13 DIAGNOSIS — L509 Urticaria, unspecified: Secondary | ICD-10-CM

## 2019-04-13 MED ORDER — LOSARTAN POTASSIUM 25 MG PO TABS: 25.0000 mg | ORAL_TABLET | Freq: Every day | ORAL | 2 refills | Status: DC

## 2019-04-13 MED ORDER — PANTOPRAZOLE SODIUM 40 MG PO TBEC: DELAYED_RELEASE_TABLET | ORAL | 2 refills | Status: DC

## 2019-04-13 MED ORDER — ALPRAZOLAM 0.5 MG PO TABS: 0.5000 mg | ORAL_TABLET | Freq: Every evening | ORAL | 5 refills | Status: DC | PRN

## 2019-04-13 MED ORDER — CETIRIZINE HCL 10 MG PO TABS: ORAL_TABLET | ORAL | 5 refills | Status: DC

## 2019-04-13 MED ORDER — FAMOTIDINE 20 MG PO TABS: 20.0000 mg | ORAL_TABLET | Freq: Every day | ORAL | 1 refills | Status: DC

## 2019-04-13 NOTE — Progress Notes (Signed)
Subjective:    Patient ID: Cassidy Barber, female    DOB: 01-22-1951, 68 y.o.   MRN: [id number]  Chief Complaint  Patient presents with  . Medical Management of Chronic Issues   PT presents to the office today for chronic follow up. She is followed by [facility] for hx of liver transplant. She had an incarcerated ventral hernia with fluid  In the hernia sac 04/05/18. She had Emergency surgery.  She had a left reverse shoulder 07/17/18 and doing well.  Anxiety  Presents for follow-up visit. Symptoms include excessive worry, irritability, nervous/anxious behavior and restlessness. Patient reports no depressed mood or shortness of breath. Symptoms occur most days. The severity of symptoms is moderate. The quality of sleep is good.    Hypertension  This is a chronic problem. The current episode started more than 1 year ago. The problem has been resolved since onset. The problem is controlled. Associated symptoms include anxiety and malaise/fatigue. Pertinent negatives include no headaches, peripheral edema or shortness of breath. Risk factors for coronary artery disease include dyslipidemia, obesity and sedentary lifestyle. The current treatment provides moderate improvement. There is no history of CAD/MI or heart failure. Identifiable causes of hypertension include a thyroid problem.  Gastroesophageal Reflux  She complains of heartburn. She reports no belching, no globus sensation or no hoarse voice. This is a chronic problem. The current episode started more than 1 year ago. The problem occurs occasionally. The problem has been waxing and waning. The symptoms are aggravated by certain foods. Risk factors include obesity. She has tried a PPI for the symptoms. The treatment provided moderate relief.  Thyroid Problem  Presents for follow-up visit. Symptoms include anxiety. Patient reports no depressed mood or hoarse voice. The symptoms have been stable. There is no history of heart failure.  Urticaria   This is a recurrent problem. The current episode started more than 1 month ago. The problem has been rapidly improving (since seeing allergens and starting zyrtec BID, and pepcid in AM) since onset. Pertinent negatives include no shortness of breath. The treatment provided moderate relief.      Review of Systems  Constitutional: Positive for irritability and malaise/fatigue.  HENT: Negative for hoarse voice.   Respiratory: Negative for shortness of breath.   Gastrointestinal: Positive for heartburn.  Neurological: Negative for headaches.  Psychiatric/Behavioral: The patient is nervous/anxious.   All other systems reviewed and are negative.      Objective:   Physical Exam Vitals signs reviewed.  Constitutional:      General: She is not in acute distress.    Appearance: She is well-developed.  HENT:     Head: Normocephalic and atraumatic.     Right Ear: Tympanic membrane normal.  Eyes:     Pupils: Pupils are equal, round, and reactive to light.  Neck:     Musculoskeletal: Normal range of motion and neck supple.     Thyroid: No thyromegaly.  Cardiovascular:     Rate and Rhythm: Normal rate and regular rhythm.     Heart sounds: Normal heart sounds. No murmur.  Pulmonary:     Effort: Pulmonary effort is normal. No respiratory distress.     Breath sounds: Normal breath sounds. No wheezing.  Abdominal:     General: Bowel sounds are normal. There is no distension.     Palpations: Abdomen is soft.     Tenderness: There is no abdominal tenderness.  Musculoskeletal: Normal range of motion.        General: No  tenderness.  Skin:    General: Skin is warm and dry.  Neurological:     Mental Status: She is alert and oriented to person, place, and time.     Cranial Nerves: No cranial nerve deficit.     Deep Tendon Reflexes: Reflexes are normal and symmetric.  Psychiatric:        Behavior: Behavior normal.        Thought Content: Thought content normal.        Judgment: Judgment  normal.       BP 117/63   Pulse 60   Temp (!) 96.8 F (36 C) (Oral)   Ht 5' 2.5" (1.588 m)   Wt 151 lb 12.8 oz (68.9 kg)   BMI 27.32 kg/m      Assessment & Plan:  Rya B Atiyeh comes in today with chief complaint of Medical Management of Chronic Issues   Diagnosis and orders addressed:  1. Essential hypertension, benign - losartan (COZAAR) 25 MG tablet; Take 1 tablet (25 mg total) by mouth daily.  Dispense: 90 tablet; Refill: 2 - CBC with Differential/Platelet - CMP14+EGFR  2. GAD (generalized anxiety disorder) - ALPRAZolam (XANAX) 0.5 MG tablet; Take 1 tablet (0.5 mg total) by mouth at bedtime as needed.  Dispense: 30 tablet; Refill: 5 - CBC with Differential/Platelet - CMP14+EGFR  3. Gastroesophageal reflux disease, esophagitis presence not specified - pantoprazole (PROTONIX) 40 MG tablet; TAKE 1 TABLET BY MOUTH ONCE DAILY BEFORE BREAKFAST  Dispense: 90 tablet; Refill: 2 - CBC with Differential/Platelet - CMP14+EGFR  4. Other iron deficiency anemia - CBC with Differential/Platelet - CMP14+EGFR  5. Insomnia, unspecified type - CBC with Differential/Platelet - CMP14+EGFR  6. Acquired hypothyroidism - CBC with Differential/Platelet - CMP14+EGFR - TSH  7. Hx of liver transplant (HCC) - CBC with Differential/Platelet - CMP14+EGFR  8. Urticaria - cetirizine (ZYRTEC) 10 MG tablet; Take 1-2 tablets 2 times a day max 40mg  Dispense: 120 tablet; Refill: 5 - famotidine (PEPCID) 20 MG tablet; Take 1 tablet (20 mg total) by mouth daily. Take 1 tablet twice a day  Dispense: 90 tablet; Refill: 1 - CBC with Differential/Platelet - CMP14+EGFR  9. Benzodiazepine dependence (HCC) - ALPRAZolam (XANAX) 0.5 MG tablet; Take 1 tablet (0.5 mg total) by mouth at bedtime as needed.  Dispense: 30 tablet; Refill: 5 - CBC with Differential/Platelet - CMP14+EGFR - ToxASSURE Select 13 (MW), Urine  10. Controlled substance agreement signed - ALPRAZolam (XANAX) 0.5 MG tablet; Take  1 tablet (0.5 mg total) by mouth at bedtime as needed.  Dispense: 30 tablet; Refill: 5 - CBC with Differential/Platelet - CMP14+EGFR - ToxASSURE Select 13 (MW), Urine   Labs pending Health Maintenance reviewed Diet and exercise encouraged  Follow up plan: 6 months     , FNP  

## 2019-04-13 NOTE — Patient Instructions (Signed)
Health Maintenance After Age 68 After age 68, you are at a higher risk for certain long-term diseases and infections as well as injuries from falls. Falls are a major cause of broken bones and head injuries in people who are older than age 68. Getting regular preventive care can help to keep you healthy and well. Preventive care includes getting regular testing and making lifestyle changes as recommended by your health care provider. Talk with your health care provider about:  Which screenings and tests you should have. A screening is a test that checks for a disease when you have no symptoms.  A diet and exercise plan that is right for you. What should I know about screenings and tests to prevent falls? Screening and testing are the best ways to find a health problem early. Early diagnosis and treatment give you the best chance of managing medical conditions that are common after age 68. Certain conditions and lifestyle choices may make you more likely to have a fall. Your health care provider may recommend:  Regular vision checks. Poor vision and conditions such as cataracts can make you more likely to have a fall. If you wear glasses, make sure to get your prescription updated if your vision changes.  Medicine review. Work with your health care provider to regularly review all of the medicines you are taking, including over-the-counter medicines. Ask your health care provider about any side effects that may make you more likely to have a fall. Tell your health care provider if any medicines that you take make you feel dizzy or sleepy.  Osteoporosis screening. Osteoporosis is a condition that causes the bones to get weaker. This can make the bones weak and cause them to break more easily.  Blood pressure screening. Blood pressure changes and medicines to control blood pressure can make you feel dizzy.  Strength and balance checks. Your health care provider may recommend certain tests to check your  strength and balance while standing, walking, or changing positions.  Foot health exam. Foot pain and numbness, as well as not wearing proper footwear, can make you more likely to have a fall.  Depression screening. You may be more likely to have a fall if you have a fear of falling, feel emotionally low, or feel unable to do activities that you used to do.  Alcohol use screening. Using too much alcohol can affect your balance and may make you more likely to have a fall. What actions can I take to lower my risk of falls? General instructions  Talk with your health care provider about your risks for falling. Tell your health care provider if: ? You fall. Be sure to tell your health care provider about all falls, even ones that seem minor. ? You feel dizzy, sleepy, or off-balance.  Take over-the-counter and prescription medicines only as told by your health care provider. These include any supplements.  Eat a healthy diet and maintain a healthy weight. A healthy diet includes low-fat dairy products, low-fat (lean) meats, and fiber from whole grains, beans, and lots of fruits and vegetables. Home safety  Remove any tripping hazards, such as rugs, cords, and clutter.  Install safety equipment such as grab bars in bathrooms and safety rails on stairs.  Keep rooms and walkways well-lit. Activity   Follow a regular exercise program to stay fit. This will help you maintain your balance. Ask your health care provider what types of exercise are appropriate for you.  If you need a cane or   walker, use it as recommended by your health care provider.  Wear supportive shoes that have nonskid soles. Lifestyle  Do not drink alcohol if your health care provider tells you not to drink.  If you drink alcohol, limit how much you have: ? 0-1 drink a day for women. ? 0-2 drinks a day for men.  Be aware of how much alcohol is in your drink. In the [place] one drink equals one typical bottle of beer (12  oz), one-half glass of wine (5 oz), or one shot of hard liquor (1 oz).  Do not use any products that contain nicotine or tobacco, such as cigarettes and e-cigarettes. If you need help quitting, ask your health care provider. Summary  Having a healthy lifestyle and getting preventive care can help to protect your health and wellness after age 68.  Screening and testing are the best way to find a health problem early and help you avoid having a fall. Early diagnosis and treatment give you the best chance for managing medical conditions that are more common for people who are older than age 68.  Falls are a major cause of broken bones and head injuries in people who are older than age 68. Take precautions to prevent a fall at home.  Work with your health care provider to learn what changes you can make to improve your health and wellness and to prevent falls. This information is not intended to replace advice given to you by your health care provider. Make sure you discuss any questions you have with your health care provider. Document Released: 09/11/2017 Document Revised: 09/11/2017 Document Reviewed: 09/11/2017 Elsevier Interactive Patient Education  2019 [employer]  

## 2019-04-14 LAB — CMP14+EGFR
ALT: 18 IU/L (ref 0–32)
AST: 31 IU/L (ref 0–40)
Albumin/Globulin Ratio: 1.3 (ref 1.2–2.2)
Albumin: 3.9 g/dL (ref 3.8–4.8)
Alkaline Phosphatase: 295 IU/L — ABNORMAL HIGH (ref 39–117)
BUN/Creatinine Ratio: 18 (ref 12–28)
BUN: 14 mg/dL (ref 8–27)
Bilirubin Total: <0.2 mg/dL (ref 0.0–1.2)
CO2: 25 mmol/L (ref 20–29)
Calcium: 9.1 mg/dL (ref 8.7–10.3)
Chloride: 103 mmol/L (ref 96–106)
Creatinine, Ser: 0.79 mg/dL (ref 0.57–1.00)
GFR calc Af Amer: 90 mL/min/{1.73_m2} (ref >59)
GFR calc non Af Amer: 78 mL/min/{1.73_m2} (ref >59)
Globulin, Total: 3 g/dL (ref 1.5–4.5)
Glucose: 90 mg/dL (ref 65–99)
Potassium: 4.1 mmol/L (ref 3.5–5.2)
Sodium: 143 mmol/L (ref 134–144)
Total Protein: 6.9 g/dL (ref 6.0–8.5)

## 2019-04-14 LAB — CBC WITH DIFFERENTIAL/PLATELET
Basophils Absolute: 0.2 10*3/uL (ref 0.0–0.2)
Basos: 3 %
EOS (ABSOLUTE): 0.5 10*3/uL — ABNORMAL HIGH (ref 0.0–0.4)
Eos: 8 %
Hematocrit: 34.5 % (ref 34.0–46.6)
Hemoglobin: 11.1 g/dL (ref 11.1–15.9)
Immature Grans (Abs): 0 10*3/uL (ref 0.0–0.1)
Immature Granulocytes: 0 %
Lymphocytes Absolute: 2.4 10*3/uL (ref 0.7–3.1)
Lymphs: 35 %
MCH: 27.4 pg (ref 26.6–33.0)
MCHC: 32.2 g/dL (ref 31.5–35.7)
MCV: 85 fL (ref 79–97)
Monocytes Absolute: 1.1 10*3/uL — ABNORMAL HIGH (ref 0.1–0.9)
Monocytes: 17 %
Neutrophils Absolute: 2.6 10*3/uL (ref 1.4–7.0)
Neutrophils: 37 %
Platelets: 380 10*3/uL (ref 150–450)
RBC: 4.05 x10E6/uL (ref 3.77–5.28)
RDW: 13 % (ref 11.7–15.4)
WBC: 6.8 10*3/uL (ref 3.4–10.8)

## 2019-04-14 LAB — TSH: TSH: 3.51 u[IU]/mL (ref 0.450–4.500)

## 2019-04-17 LAB — TOXASSURE SELECT 13 (MW), URINE

## 2019-04-22 ENCOUNTER — Other Ambulatory Visit (HOSPITAL_COMMUNITY): Payer: Self-pay | Admitting: *Deleted

## 2019-04-22 DIAGNOSIS — D508 Other iron deficiency anemias: Secondary | ICD-10-CM

## 2019-04-22 DIAGNOSIS — D473 Essential (hemorrhagic) thrombocythemia: Secondary | ICD-10-CM

## 2019-04-22 DIAGNOSIS — D75839 Thrombocytosis, unspecified: Secondary | ICD-10-CM

## 2019-04-22 DIAGNOSIS — D638 Anemia in other chronic diseases classified elsewhere: Secondary | ICD-10-CM

## 2019-04-23 ENCOUNTER — Other Ambulatory Visit: Payer: Self-pay

## 2019-04-23 ENCOUNTER — Inpatient Hospital Stay (HOSPITAL_COMMUNITY): Payer: Medicare Other | Attending: Hematology

## 2019-04-23 DIAGNOSIS — D473 Essential (hemorrhagic) thrombocythemia: Secondary | ICD-10-CM | POA: Diagnosis not present

## 2019-04-23 DIAGNOSIS — M818 Other osteoporosis without current pathological fracture: Secondary | ICD-10-CM | POA: Diagnosis not present

## 2019-04-23 DIAGNOSIS — K743 Primary biliary cirrhosis: Secondary | ICD-10-CM | POA: Insufficient documentation

## 2019-04-23 DIAGNOSIS — Z944 Liver transplant status: Secondary | ICD-10-CM | POA: Diagnosis not present

## 2019-04-23 DIAGNOSIS — D75839 Thrombocytosis, unspecified: Secondary | ICD-10-CM

## 2019-04-23 DIAGNOSIS — D638 Anemia in other chronic diseases classified elsewhere: Secondary | ICD-10-CM

## 2019-04-23 DIAGNOSIS — D508 Other iron deficiency anemias: Secondary | ICD-10-CM | POA: Diagnosis not present

## 2019-04-23 DIAGNOSIS — Z79899 Other long term (current) drug therapy: Secondary | ICD-10-CM | POA: Diagnosis not present

## 2019-04-23 LAB — COMPREHENSIVE METABOLIC PANEL
ALT: 20 U/L (ref 0–44)
AST: 32 U/L (ref 15–41)
Albumin: 3.3 g/dL — ABNORMAL LOW (ref 3.5–5.0)
Alkaline Phosphatase: 231 U/L — ABNORMAL HIGH (ref 38–126)
Anion gap: 9 (ref 5–15)
BUN: 14 mg/dL (ref 8–23)
CO2: 27 mmol/L (ref 22–32)
Calcium: 8.6 mg/dL — ABNORMAL LOW (ref 8.9–10.3)
Chloride: 104 mmol/L (ref 98–111)
Creatinine, Ser: 0.98 mg/dL (ref 0.44–1.00)
GFR calc Af Amer: >60 mL/min (ref >60)
GFR calc non Af Amer: 60 mL/min — ABNORMAL LOW (ref >60)
Glucose, Bld: 101 mg/dL — ABNORMAL HIGH (ref 70–99)
Potassium: 3.9 mmol/L (ref 3.5–5.1)
Sodium: 140 mmol/L (ref 135–145)
Total Bilirubin: 0.3 mg/dL (ref 0.3–1.2)
Total Protein: 6.9 g/dL (ref 6.5–8.1)

## 2019-04-23 LAB — CBC WITH DIFFERENTIAL/PLATELET
Abs Immature Granulocytes: 0.01 10*3/uL (ref 0.00–0.07)
Basophils Absolute: 0.2 10*3/uL — ABNORMAL HIGH (ref 0.0–0.1)
Basophils Relative: 2 %
Eosinophils Absolute: 0.5 10*3/uL (ref 0.0–0.5)
Eosinophils Relative: 8 %
HCT: 35.2 % — ABNORMAL LOW (ref 36.0–46.0)
Hemoglobin: 10.6 g/dL — ABNORMAL LOW (ref 12.0–15.0)
Immature Granulocytes: 0 %
Lymphocytes Relative: 29 %
Lymphs Abs: 1.9 10*3/uL (ref 0.7–4.0)
MCH: 27.2 pg (ref 26.0–34.0)
MCHC: 30.1 g/dL (ref 30.0–36.0)
MCV: 90.5 fL (ref 80.0–100.0)
Monocytes Absolute: 1.1 10*3/uL — ABNORMAL HIGH (ref 0.1–1.0)
Monocytes Relative: 16 %
Neutro Abs: 3 10*3/uL (ref 1.7–7.7)
Neutrophils Relative %: 45 %
Platelets: 365 10*3/uL (ref 150–400)
RBC: 3.89 MIL/uL (ref 3.87–5.11)
RDW: 13.9 % (ref 11.5–15.5)
WBC: 6.6 10*3/uL (ref 4.0–10.5)
nRBC: 0 % (ref 0.0–0.2)

## 2019-04-23 LAB — LACTATE DEHYDROGENASE: LDH: 224 U/L — ABNORMAL HIGH (ref 98–192)

## 2019-04-23 LAB — FERRITIN: Ferritin: 13 ng/mL (ref 11–307)

## 2019-04-27 ENCOUNTER — Encounter (INDEPENDENT_AMBULATORY_CARE_PROVIDER_SITE_OTHER): Payer: Self-pay

## 2019-04-28 ENCOUNTER — Other Ambulatory Visit: Payer: Self-pay

## 2019-04-28 ENCOUNTER — Ambulatory Visit (INDEPENDENT_AMBULATORY_CARE_PROVIDER_SITE_OTHER): Payer: Medicare Other | Admitting: Internal Medicine

## 2019-04-28 ENCOUNTER — Encounter (INDEPENDENT_AMBULATORY_CARE_PROVIDER_SITE_OTHER): Payer: Self-pay | Admitting: Internal Medicine

## 2019-04-28 VITALS — BP 124/75 | HR 61 | Temp 98.2°F | Resp 18 | Ht 66.0 in | Wt 149.1 lb

## 2019-04-28 DIAGNOSIS — Z85038 Personal history of other malignant neoplasm of large intestine: Secondary | ICD-10-CM

## 2019-04-28 DIAGNOSIS — K219 Gastro-esophageal reflux disease without esophagitis: Secondary | ICD-10-CM

## 2019-04-28 DIAGNOSIS — K529 Noninfective gastroenteritis and colitis, unspecified: Secondary | ICD-10-CM

## 2019-04-28 NOTE — Progress Notes (Signed)
Presenting complaint;  Follow for chronic GERD diarrhea. History of colon cancer. History of liver transplant for which she is followed at [facility].  Database and subjective:  Patient is 68 year old Caucasian female with multiple GI problems who is here for scheduled visit.  She was last seen in the office in February 2019 but she did undergo flexible sigmoidoscopy in October 2019. 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  She developed progressive disease and went on to have LDLT in January 2015.  Her son Rachel Bo was the donor.  He is doing well. Prior to her liver transplant she developed colon carcinoma and discovered to have Lynch syndrome.  She only has rectum and distal sigmoid colon remaining.  She developed diarrhea following her surgery requiring antidiarrheals which she is not using regularly. In May last year she had emergency surgery for incarcerated ventral hernia.  Patient states she is doing well.  She has no more than 3-4 bowel movements daily and all of these occur in the morning.  Stool is always loose.  She denies melena or rectal bleeding.  She does not take antidiarrheal anymore.  She would use depends as she has to travel long distance.  She feels heartburn is well controlled with therapy.  She complains of abdominal pain in right mid abdomen where she has a lump.  She says abdominal support eases the pain. She said she had blood work twice this month and her alkaline phosphatase was elevated but trending down.  She is scheduled to undergo MRI at [facility] next month. She recently had blood work via oncology clinic at [facility].  She has low serum ferritin and low hemoglobin.  She previously has been on iron which was discontinued by Dr. Walden Field.   Current Medications: Outpatient Encounter Medications as of 04/28/2019  Medication Sig  . alendronate (FOSAMAX) 70 MG tablet TAKE 1 TABLET BY MOUTH ONCE A WEEK WITH A FULL GLASS OF WATER ON AN EMPTY STOMACH  . ALPRAZolam  (XANAX) 0.5 MG tablet Take 1 tablet (0.5 mg total) by mouth at bedtime as needed.  [name] aspirin EC 81 MG tablet Take 81 mg by mouth daily.  . Biotin 10000 MCG TABS Take 10,000 mcg by mouth daily.  [name] CALCIUM CITRATE PO Take 1,200 mg by mouth 2 (two) times daily.   . cetirizine (ZYRTEC) 10 MG tablet Take 1-2 tablets 2 times a day max '40mg'$   . Cholecalciferol (VITAMIN D3) 75 MCG (3000 UT) TABS Take by mouth.  . cyclobenzaprine (FLEXERIL) 5 MG tablet Take 1 tablet (5 mg total) by mouth 3 (three) times daily as needed for muscle spasms. (Patient taking differently: Take 5 mg by mouth 2 (two) times daily as needed for muscle spasms. )  . EPINEPHrine 0.3 mg/0.3 mL IJ SOAJ injection Use as directed for severe allergic reaction  . famotidine (PEPCID) 20 MG tablet Take 1 tablet (20 mg total) by mouth daily. Take 1 tablet twice a day  . fluorouracil (EFUDEX) 5 % cream Apply 1 application topically daily as needed (cancer spots).   [name] HYDROcodone-acetaminophen (NORCO/VICODIN) 5-325 MG tablet Take 1 tablet by mouth 3 (three) times daily as needed for moderate pain.  . hydrocortisone cream 1 % Apply 1 application topically daily as needed for itching.  . hydrOXYzine (ATARAX/VISTARIL) 25 MG tablet TAKE 1 TABLET THREE TIMES DAILY AS NEEDED FOR ITCHING  . levothyroxine (SYNTHROID, LEVOTHROID) 125 MCG tablet Take 1 tablet (125 mcg total) by mouth daily.  [name] losartan (COZAAR)  25 MG tablet Take 1 tablet (25 mg total) by mouth daily.  . magnesium oxide (MAG-OX) 400 MG tablet Take 400 mg by mouth daily.  . metoprolol succinate (TOPROL XL) 25 MG 24 hr tablet Take 1 tablet (25 mg total) by mouth daily.  . montelukast (SINGULAIR) 10 MG tablet Take 1 tablet (10 mg total) by mouth at bedtime.  . pantoprazole (PROTONIX) 40 MG tablet TAKE 1 TABLET BY MOUTH ONCE DAILY BEFORE BREAKFAST  . Polypodium Leucotomos (HELIOCARE) 240 MG CAPS Take by mouth daily. Patient states that she takes 2 daily.  . Probiotic Product (PROBIOTIC-10 PO)  Take by mouth daily.  . ursodiol (ACTIGALL) 250 MG tablet Take 300 mg by mouth 2 (two) times daily.   . vitamin B-12 (CYANOCOBALAMIN) 1000 MCG tablet Take 1,000 mcg by mouth daily.  . Vitamin D, Ergocalciferol, (DRISDOL) 50000 units CAPS capsule Take 1 capsule (50,000 Units total) by mouth once a week.  . vitamin E (VITAMIN E) 400 UNIT capsule Take 400 Units by mouth daily.  [name] ZORTRESS 0.5 MG TABS TAKE 5 TABLETS BY MOUTH EVERY 12 HOURS  . [DISCONTINUED] everolimus (AFINITOR) 5 MG tablet Take 12.5 mg by mouth 2 (two) times daily.  . [DISCONTINUED] ondansetron (ZOFRAN) 4 MG tablet Take 4 mg by mouth every 8 (eight) hours as needed for nausea or vomiting.  . loperamide (IMODIUM) 2 MG capsule TAKE 2 CAPSULES 4 TIMES DAILY AS NEEDED (Patient not taking: Reported on 04/28/2019)  . [DISCONTINUED] propranolol (INDERAL) 80 MG tablet Take 80 mg by mouth daily.    No facility-administered encounter medications on file as of 04/28/2019.      Objective: Blood pressure 124/75, pulse 61, temperature 98.2 F (36.8 C), temperature source Oral, resp. rate 18, height '5\' 6"'$  (1.676 m), weight 149 lb 1.6 oz (67.6 kg). Patient is alert and in no acute distress. Conjunctiva is pink. Sclera is nonicteric Oropharyngeal mucosa is normal. No neck masses or thyromegaly noted. Cardiac exam with regular rhythm normal S1 and S2. No murmur or gallop noted. Lungs are clear to auscultation. Abdomen  No LE edema or clubbing noted.  Labs/studies Results:   CBC Latest Ref Rng & Units 04/23/2019 04/13/2019 11/24/2018  WBC 4.0 - 10.5 K/uL 6.6 6.8 6.1  Hemoglobin 12.0 - 15.0 g/dL 10.6(L) 11.1 11.6  Hematocrit 36.0 - 46.0 % 35.2(L) 34.5 36.9  Platelets 150 - 400 K/uL 365 380 310    CMP Latest Ref Rng & Units 04/23/2019 04/13/2019 11/24/2018  Glucose 70 - 99 mg/dL 101(H) 90 94  BUN 8 - 23 mg/dL '14 14 14  '$ Creatinine 0.44 - 1.00 mg/dL 0.98 0.79 0.87  Sodium 135 - 145 mmol/L 140 143 141  Potassium 3.5 - 5.1 mmol/L 3.9 4.1 4.4   Chloride 98 - 111 mmol/L 104 103 103  CO2 22 - 32 mmol/L '27 25 25  '$ Calcium 8.9 - 10.3 mg/dL 8.6(L) 9.1 8.8  Total Protein 6.5 - 8.1 g/dL 6.9 6.9 6.2  Total Bilirubin 0.3 - 1.2 mg/dL 0.3 <0.2 <0.2  Alkaline Phos 38 - 126 U/L 231(H) 295(H) 168(H)  AST 15 - 41 U/L 32 31 25  ALT 0 - 44 U/L '20 18 24    '$ Hepatic Function Latest Ref Rng & Units 04/23/2019 04/13/2019 11/24/2018  Total Protein 6.5 - 8.1 g/dL 6.9 6.9 6.2  Albumin 3.5 - 5.0 g/dL 3.3(L) 3.9 3.9  AST 15 - 41 U/L 32 31 25  ALT 0 - 44 U/L '20 18 24  '$ Alk Phosphatase 38 -  126 U/L 231(H) 295(H) 168(H)  Total Bilirubin 0.3 - 1.2 mg/dL 0.3 <0.2 <0.2  Bilirubin, Direct 0.00 - 0.40 mg/dL - - -    Lab Results  Component Value Date   CRP <0.8 08/19/2018      Assessment:  #1.  History of colon carcinoma and Lynch syndrome.  She will be due for surveillance sigmoidoscopy in October 2020.  #2.  GERD symptoms are well controlled with PPI in the morning and H2B in the evening.  #3.  Anemia and low serum ferritin.  She has an appointment to see Dr. Delton Coombes later this week.  Suspect iron deficiency anemia due to chronic acid suppression.  #4.  Mildly elevated serum alkaline phosphatase.  She has a history of anastomotic biliary stricture and has had stenting few years back.  Alkaline phosphatase is trending down and therefore of no concern.  Agree with plans for MRCP.  #5.  Chronic diarrhea secondary to loss of most of her colon.  Stool frequency has decreased significantly and she is not even taking antidiarrheal.   Plan:  Patient will undergo flexible sigmoidoscopy in October 2020. Will wait for Dr. Tomie China recommendations regarding anemia and low ferritin levels. Office visit in 1 year.

## 2019-04-28 NOTE — Patient Instructions (Signed)
Flexible sigmoidoscopy to be scheduled in October 2020.

## 2019-04-29 ENCOUNTER — Other Ambulatory Visit: Payer: Self-pay

## 2019-04-30 ENCOUNTER — Encounter (HOSPITAL_COMMUNITY): Payer: Self-pay | Admitting: Hematology

## 2019-04-30 ENCOUNTER — Inpatient Hospital Stay (HOSPITAL_COMMUNITY): Payer: Medicare Other | Admitting: Hematology

## 2019-04-30 DIAGNOSIS — K743 Primary biliary cirrhosis: Secondary | ICD-10-CM

## 2019-04-30 DIAGNOSIS — Z79899 Other long term (current) drug therapy: Secondary | ICD-10-CM | POA: Diagnosis not present

## 2019-04-30 DIAGNOSIS — Z944 Liver transplant status: Secondary | ICD-10-CM

## 2019-04-30 DIAGNOSIS — D473 Essential (hemorrhagic) thrombocythemia: Secondary | ICD-10-CM

## 2019-04-30 DIAGNOSIS — D508 Other iron deficiency anemias: Secondary | ICD-10-CM | POA: Diagnosis not present

## 2019-04-30 DIAGNOSIS — M818 Other osteoporosis without current pathological fracture: Secondary | ICD-10-CM

## 2019-04-30 NOTE — Progress Notes (Signed)
Patient Care Team: [name], FNP as PCP - General (Family Medicine) Eustace Moore, MD as Consulting Physician (Neurosurgery)  DIAGNOSIS:  Encounter Diagnosis  Name Primary?  . Other iron deficiency anemia     SUMMARY OF ONCOLOGIC HISTORY: Oncology History   No history exists.    CHIEF COMPLIANT: Iron deficiency anemia.  INTERVAL HISTORY: Cassidy Barber is a 68 year old very pleasant white female seen in follow-up for iron deficiency state and thrombocytosis.  Appetite is 100%.  Energy levels are 100%.  She had liver transplant done at [facility].  She cannot tolerate oral iron therapy.  She has received parenteral iron therapy in the past.  Denies any bleeding per rectum or melena.  Denies any fevers, night sweats or weight loss in the last 6 months.  REVIEW OF SYSTEMS:   Constitutional: Denies fevers, chills or abnormal weight loss Eyes: Denies blurriness of vision Ears, nose, mouth, throat, and face: Denies mucositis or sore throat Respiratory: Denies cough, dyspnea or wheezes Cardiovascular: Denies palpitation, chest discomfort Gastrointestinal:  Denies nausea, heartburn or change in bowel habits Skin: Denies abnormal skin rashes Lymphatics: Denies new lymphadenopathy or easy bruising Neurological:Denies numbness, tingling or new weaknesses Behavioral/Psych: Mood is stable, no new changes  Extremities: No lower extremity edema All other systems were reviewed with the patient and are negative.  I have reviewed the past medical history, past surgical history, social history and family history with the patient and they are unchanged from previous note.  ALLERGIES:  is allergic to ciprofloxacin and codeine.  MEDICATIONS:  Current Outpatient Medications  Medication Sig Dispense Refill  . alendronate (FOSAMAX) 70 MG tablet TAKE 1 TABLET BY MOUTH ONCE A WEEK WITH A FULL GLASS OF WATER ON AN EMPTY STOMACH 12 tablet 1  . ALPRAZolam (XANAX) 0.5 MG  tablet Take 1 tablet (0.5 mg total) by mouth at bedtime as needed. 30 tablet 5  . aspirin EC 81 MG tablet Take 81 mg by mouth daily.    . Biotin 10000 MCG TABS Take 10,000 mcg by mouth daily.    [name] CALCIUM CITRATE PO Take 1,200 mg by mouth 2 (two) times daily.     . cetirizine (ZYRTEC) 10 MG tablet Take 1-2 tablets 2 times a day max 40mg  120 tablet 5  . Cholecalciferol (VITAMIN D3) 75 MCG (3000 UT) TABS Take by mouth.    . cyclobenzaprine (FLEXERIL) 5 MG tablet Take 1 tablet (5 mg total) by mouth 3 (three) times daily as needed for muscle spasms. (Patient taking differently: Take 5 mg by mouth 2 (two) times daily as needed for muscle spasms. ) 60 tablet 1  . EPINEPHrine 0.3 mg/0.3 mL IJ SOAJ injection Use as directed for severe allergic reaction 2 Device 2  . famotidine (PEPCID) 20 MG tablet Take 1 tablet (20 mg total) by mouth daily. Take 1 tablet twice a day (Patient taking differently: Take 20 mg by mouth at bedtime. Take 1 tablet twice a day) 90 tablet 1  . fluorouracil (EFUDEX) 5 % cream Apply 1 application topically daily as needed (cancer spots).   0  . HYDROcodone-acetaminophen (NORCO/VICODIN) 5-325 MG tablet Take 1 tablet by mouth 3 (three) times daily as needed for moderate pain.    . hydrocortisone cream 1 % Apply 1 application topically daily as needed for itching.    . hydrOXYzine (ATARAX/VISTARIL) 25 MG tablet TAKE 1 TABLET THREE TIMES DAILY AS NEEDED FOR ITCHING 60 tablet 5  . levothyroxine (SYNTHROID, LEVOTHROID) 125  MCG tablet Take 1 tablet (125 mcg total) by mouth daily. 90 tablet 2  . loperamide (IMODIUM) 2 MG capsule TAKE 2 CAPSULES 4 TIMES DAILY AS NEEDED 90 capsule 1  . losartan (COZAAR) 25 MG tablet Take 1 tablet (25 mg total) by mouth daily. 90 tablet 2  . magnesium oxide (MAG-OX) 400 MG tablet Take 400 mg by mouth daily.    . metoprolol succinate (TOPROL XL) 25 MG 24 hr tablet Take 1 tablet (25 mg total) by mouth daily. 30 tablet 11  . montelukast (SINGULAIR) 10 MG tablet  Take 1 tablet (10 mg total) by mouth at bedtime. 30 tablet 5  . pantoprazole (PROTONIX) 40 MG tablet TAKE 1 TABLET BY MOUTH ONCE DAILY BEFORE BREAKFAST 90 tablet 2  . Polypodium Leucotomos (HELIOCARE) 240 MG CAPS Take by mouth daily. Patient states that she takes 2 daily.    . Probiotic Product (PROBIOTIC-10 PO) Take by mouth daily.    . ursodiol (ACTIGALL) 250 MG tablet Take 300 mg by mouth 2 (two) times daily.     . vitamin B-12 (CYANOCOBALAMIN) 1000 MCG tablet Take 1,000 mcg by mouth daily.    . Vitamin D, Ergocalciferol, (DRISDOL) 50000 units CAPS capsule Take 1 capsule (50,000 Units total) by mouth once a week. 12 capsule 2  . vitamin E (VITAMIN E) 400 UNIT capsule Take 400 Units by mouth daily.    [name] ZORTRESS 0.5 MG TABS TAKE 5 TABLETS BY MOUTH EVERY 12 HOURS     No current facility-administered medications for this visit.     PHYSICAL EXAMINATION: ECOG PERFORMANCE STATUS: 1 - Symptomatic but completely ambulatory  Vitals:   04/30/19 1000  BP: 135/65  Pulse: 66  Resp: 16  Temp: 98 F (36.7 C)  SpO2: 99%   Filed Weights   04/30/19 1000  Weight: 151 lb 9 oz (68.7 kg)    GENERAL:alert, no distress and comfortable SKIN: skin color, texture, turgor are normal, no rashes or significant lesions EYES: normal, Conjunctiva are pink and non-injected, sclera clear OROPHARYNX:no mucositis, no erythema and lips, buccal mucosa, and tongue normal  NECK: supple, thyroid normal size, non-tender, without nodularity LYMPH:  no palpable lymphadenopathy in the cervical, axillary or inguinal LUNGS: clear to auscultation and percussion with normal breathing effort HEART: regular rate & rhythm and no murmurs and no lower extremity edema ABDOMEN:abdomen soft, non-tender and normal bowel sounds MUSCULOSKELETAL:no cyanosis of digits and no clubbing   EXTREMITIES: No lower extremity edema   LABORATORY DATA:  I have reviewed the data as listed CMP Latest Ref Rng & Units 04/23/2019 04/13/2019  11/24/2018  Glucose 70 - 99 mg/dL 101(H) 90 94  BUN 8 - 23 mg/dL 14 14 14   Creatinine 0.44 - 1.00 mg/dL 0.98 0.79 0.87  Sodium 135 - 145 mmol/L 140 143 141  Potassium 3.5 - 5.1 mmol/L 3.9 4.1 4.4  Chloride 98 - 111 mmol/L 104 103 103  CO2 22 - 32 mmol/L 27 25 25   Calcium 8.9 - 10.3 mg/dL 8.6(L) 9.1 8.8  Total Protein 6.5 - 8.1 g/dL 6.9 6.9 6.2  Total Bilirubin 0.3 - 1.2 mg/dL 0.3 <0.2 <0.2  Alkaline Phos 38 - 126 U/L 231(H) 295(H) 168(H)  AST 15 - 41 U/L 32 31 25  ALT 0 - 44 U/L 20 18 24    No results found for: [id number]   Lab Results  Component Value Date   WBC 6.6 04/23/2019   HGB 10.6 (L) 04/23/2019   HCT 35.2 (L) 04/23/2019   MCV  90.5 04/23/2019   PLT 365 04/23/2019   NEUTROABS 3.0 04/23/2019    ASSESSMENT & PLAN:  Iron deficiency anemia 1.  Iron deficiency anemia: - She had received parenteral iron therapy in the past with  Injectafer.  Last Injectafer was on 09/16/2018. - She denies any bleeding per rectum or melena. -She had colectomy in the past secondary to Lynch syndrome.  She could not tolerate oral iron therapy. -We reviewed her blood work.  Ferritin is down to 13 and hemoglobin down to 10.6. -I have recommended 2 more infusions of parenteral iron therapy with Feraheme.  We discussed the side effects of Feraheme in detail. -We will see her back in 6 months for follow-up with repeat blood work.  2.  Thrombocytosis: - She was initially worked up for thrombocytosis with negative Jak 2 mutation and BCR/ABL. -Her thrombocytosis has improved after iron replacement.  3.  Primary biliary cirrhosis: - She had orthotopic liver transplant at [facility].  4.  Osteoporosis: As she is on Fosamax 70 mg weekly.   Total time spent is 25 minutes with more than 50% of the time spent face-to-face discussing treatment plan, side effects and coordination of care. No orders of the defined types were placed in this encounter.  The patient has a good  understanding of the overall plan. she agrees with it. she will call with any problems that may develop before the next visit here.   Derek Jack, MD 04/30/19

## 2019-04-30 NOTE — Patient Instructions (Addendum)
[facility] at [facility] Discharge Instructions  You were seen today by Dr. Katragadda. He went over your recent lab results. He will see you back in 6 months for labs and follow up.   Thank you for choosing  Cancer Center at [facility] to provide your oncology and hematology care.  To afford each patient quality time with our provider, please arrive at least 15 minutes before your scheduled appointment time.   If you have a lab appointment with the Cancer Center please come in thru the  Main Entrance and check in at the main information desk  You need to re-schedule your appointment should you arrive 10 or more minutes late.  We strive to give you quality time with our providers, and arriving late affects you and other patients whose appointments are after yours.  Also, if you no show three or more times for appointments you may be dismissed from the clinic at the providers discretion.     Again, thank you for choosing [facility].  Our hope is that these requests will decrease the amount of time that you wait before being seen by our physicians.       _____________________________________________________________  Should you have questions after your visit to [facility], please contact our office at [phone] between the hours of 8:00 a.m. and 4:30 p.m.  Voicemails left after 4:00 p.m. will not be returned until the following business day.  For prescription refill requests, have your pharmacy contact our office and allow 72 hours.    Cancer Center Support Programs:   > Cancer Support Group  2nd Tuesday of the month 1pm-2pm, Journey Room    

## 2019-04-30 NOTE — Assessment & Plan Note (Signed)
1.  Iron deficiency anemia: - She had received parenteral iron therapy in the past with  Injectafer.  Last Injectafer was on 09/16/2018. - She denies any bleeding per rectum or melena. -She had colectomy in the past secondary to Lynch syndrome.  She could not tolerate oral iron therapy. -We reviewed her blood work.  Ferritin is down to 13 and hemoglobin down to 10.6. -I have recommended 2 more infusions of parenteral iron therapy with Feraheme.  We discussed the side effects of Feraheme in detail. -We will see her back in 6 months for follow-up with repeat blood work.  2.  Thrombocytosis: - She was initially worked up for thrombocytosis with negative Jak 2 mutation and BCR/ABL. -Her thrombocytosis has improved after iron replacement.  3.  Primary biliary cirrhosis: - She had orthotopic liver transplant at [facility].  4.  Osteoporosis: As she is on Fosamax 70 mg weekly.

## 2019-05-06 DIAGNOSIS — C44521 Squamous cell carcinoma of skin of breast: Secondary | ICD-10-CM | POA: Diagnosis not present

## 2019-05-09 DIAGNOSIS — Z944 Liver transplant status: Secondary | ICD-10-CM | POA: Diagnosis not present

## 2019-05-11 ENCOUNTER — Other Ambulatory Visit: Payer: Self-pay

## 2019-05-11 ENCOUNTER — Encounter (HOSPITAL_COMMUNITY): Payer: Self-pay

## 2019-05-11 ENCOUNTER — Inpatient Hospital Stay (HOSPITAL_COMMUNITY): Payer: Medicare Other

## 2019-05-11 VITALS — BP 137/71 | HR 66 | Temp 98.2°F | Resp 18

## 2019-05-11 DIAGNOSIS — Z79899 Other long term (current) drug therapy: Secondary | ICD-10-CM | POA: Diagnosis not present

## 2019-05-11 DIAGNOSIS — D508 Other iron deficiency anemias: Secondary | ICD-10-CM | POA: Diagnosis not present

## 2019-05-11 DIAGNOSIS — Z944 Liver transplant status: Secondary | ICD-10-CM | POA: Diagnosis not present

## 2019-05-11 DIAGNOSIS — M818 Other osteoporosis without current pathological fracture: Secondary | ICD-10-CM | POA: Diagnosis not present

## 2019-05-11 DIAGNOSIS — K743 Primary biliary cirrhosis: Secondary | ICD-10-CM | POA: Diagnosis not present

## 2019-05-11 DIAGNOSIS — D473 Essential (hemorrhagic) thrombocythemia: Secondary | ICD-10-CM | POA: Diagnosis not present

## 2019-05-11 MED ORDER — SODIUM CHLORIDE 0.9 % IV SOLN
Freq: Once | INTRAVENOUS | Status: AC
  Administered 2019-05-11: 14:00:00 via INTRAVENOUS

## 2019-05-11 MED ORDER — SODIUM CHLORIDE 0.9 % IV SOLN
510.0000 mg | Freq: Once | INTRAVENOUS | Status: AC
  Administered 2019-05-11: 510 mg via INTRAVENOUS
  Filled 2019-05-11: qty 17

## 2019-05-11 NOTE — Progress Notes (Signed)
Pt presents today for Feraheme infusion. VSS. Pt has no complaints of any pain.   Feraheme given today per MD orders. Tolerated infusion without adverse affects. Vital signs stable. No complaints at this time. Discharged from clinic ambulatory. F/U with [facility] as scheduled.

## 2019-05-18 ENCOUNTER — Other Ambulatory Visit: Payer: Self-pay

## 2019-05-18 ENCOUNTER — Inpatient Hospital Stay (HOSPITAL_COMMUNITY): Payer: Medicare Other | Attending: Hematology

## 2019-05-18 ENCOUNTER — Encounter (HOSPITAL_COMMUNITY): Payer: Self-pay

## 2019-05-18 VITALS — BP 124/65 | HR 59 | Temp 96.7°F | Resp 18

## 2019-05-18 DIAGNOSIS — D508 Other iron deficiency anemias: Secondary | ICD-10-CM | POA: Diagnosis not present

## 2019-05-18 MED ORDER — SODIUM CHLORIDE 0.9 % IV SOLN
Freq: Once | INTRAVENOUS | Status: AC
  Administered 2019-05-18: 14:00:00 via INTRAVENOUS

## 2019-05-18 MED ORDER — SODIUM CHLORIDE 0.9 % IV SOLN
510.0000 mg | Freq: Once | INTRAVENOUS | Status: AC
  Administered 2019-05-18: 15:00:00 510 mg via INTRAVENOUS
  Filled 2019-05-18: qty 510

## 2019-05-18 NOTE — Progress Notes (Signed)
Patient tolerated iron infusion with no complaints voiced.  Peripheral IV site with good blood return noted before and after infusion.  No bruising or swelling noted at site and patient denied pain.  Band aid applied.  VSS with discharge and left ambulatory with no s/s of distress noted.  

## 2019-05-21 DIAGNOSIS — D136 Benign neoplasm of pancreas: Secondary | ICD-10-CM | POA: Diagnosis not present

## 2019-05-21 DIAGNOSIS — K743 Primary biliary cirrhosis: Secondary | ICD-10-CM | POA: Diagnosis not present

## 2019-05-21 DIAGNOSIS — Z4823 Encounter for aftercare following liver transplant: Secondary | ICD-10-CM | POA: Diagnosis not present

## 2019-05-25 ENCOUNTER — Encounter (INDEPENDENT_AMBULATORY_CARE_PROVIDER_SITE_OTHER): Payer: Self-pay

## 2019-05-25 ENCOUNTER — Other Ambulatory Visit: Payer: Self-pay | Admitting: Family

## 2019-05-28 ENCOUNTER — Telehealth: Payer: Self-pay | Admitting: Family

## 2019-05-28 DIAGNOSIS — D638 Anemia in other chronic diseases classified elsewhere: Secondary | ICD-10-CM

## 2019-05-28 NOTE — Telephone Encounter (Signed)
Lab orders placed.  

## 2019-05-28 NOTE — Telephone Encounter (Signed)
Patient aware.

## 2019-05-28 NOTE — Telephone Encounter (Signed)
Please place orders if approved.

## 2019-06-02 MED ORDER — VITAMIN D (ERGOCALCIFEROL) 1.25 MG (50000 UNIT) PO CAPS: 50000.0000 [IU] | ORAL_CAPSULE | ORAL | 2 refills | Status: DC

## 2019-06-09 ENCOUNTER — Other Ambulatory Visit: Payer: Medicare Other

## 2019-06-09 ENCOUNTER — Ambulatory Visit (INDEPENDENT_AMBULATORY_CARE_PROVIDER_SITE_OTHER): Payer: Medicare Other | Admitting: Physician Assistant

## 2019-06-09 ENCOUNTER — Other Ambulatory Visit: Payer: Self-pay

## 2019-06-09 ENCOUNTER — Encounter: Payer: Self-pay | Admitting: Physician Assistant

## 2019-06-09 VITALS — BP 124/77 | HR 76 | Temp 96.6°F | Ht 66.0 in | Wt 149.0 lb

## 2019-06-09 DIAGNOSIS — B001 Herpesviral vesicular dermatitis: Secondary | ICD-10-CM | POA: Diagnosis not present

## 2019-06-09 DIAGNOSIS — D638 Anemia in other chronic diseases classified elsewhere: Secondary | ICD-10-CM

## 2019-06-09 DIAGNOSIS — N3000 Acute cystitis without hematuria: Secondary | ICD-10-CM

## 2019-06-09 DIAGNOSIS — R300 Dysuria: Secondary | ICD-10-CM

## 2019-06-09 DIAGNOSIS — Z944 Liver transplant status: Secondary | ICD-10-CM | POA: Diagnosis not present

## 2019-06-09 LAB — URINALYSIS, COMPLETE
Bilirubin, UA: NEGATIVE
Glucose, UA: NEGATIVE
Ketones, UA: NEGATIVE
Nitrite, UA: POSITIVE — AB
Specific Gravity, UA: 1.025 (ref 1.005–1.030)
Urobilinogen, Ur: 0.2 mg/dL (ref 0.2–1.0)
pH, UA: 7 (ref 5.0–7.5)

## 2019-06-09 LAB — MICROSCOPIC EXAMINATION: WBC, UA: >30 /hpf — AB (ref 0–5)

## 2019-06-09 MED ORDER — SULFAMETHOXAZOLE-TRIMETHOPRIM 800-160 MG PO TABS: 1.0000 | ORAL_TABLET | Freq: Two times a day (BID) | ORAL | 0 refills | Status: DC

## 2019-06-09 MED ORDER — VALACYCLOVIR HCL 500 MG PO TABS: 500.0000 mg | ORAL_TABLET | Freq: Three times a day (TID) | ORAL | 1 refills | Status: DC

## 2019-06-11 DIAGNOSIS — D0421 Carcinoma in situ of skin of right ear and external auricular canal: Secondary | ICD-10-CM | POA: Diagnosis not present

## 2019-06-11 DIAGNOSIS — L570 Actinic keratosis: Secondary | ICD-10-CM | POA: Diagnosis not present

## 2019-06-11 LAB — URINE CULTURE

## 2019-06-11 NOTE — Progress Notes (Signed)
BP 124/77   Pulse 76   Temp (!) 96.6 F (35.9 C) (Tympanic)   Ht 5\' 6"  (1.676 m)   Wt 149 lb (67.6 kg)   BMI 24.05 kg/m    Subjective:    Patient ID: Cassidy Barber, female    DOB: February 01, 1951, 68 y.o.   MRN: [id number]  HPI: Cassidy Barber is a 68 y.o. female presenting on 06/09/2019 for fever blister and Urinary Tract Infection  This patient has had several days of dysuria, frequency and nocturia. There is also pain over the bladder in the suprapubic region, no back pain. Denies leakage or hematuria.  Denies fever or chills. No pain in flank area.  Patient has not had a fever blister ever had this happen before.  It is  Past Medical History:  Diagnosis Date  . Anemia of chronic disease 10/26/2013  . Hernia of unspecified site of abdominal cavity without mention of obstruction or gangrene   . Hypertension   . Liver disease   . Lynch syndrome   . Lynch syndrome   . Primary biliary cirrhosis ([place]) 10/26/2013  . Thyroid disease    Relevant past medical, surgical, family and social history reviewed and updated as indicated. Interim medical history since our last visit reviewed. Allergies and medications reviewed and updated. DATA REVIEWED: CHART IN EPIC  Family History reviewed for pertinent findings.  Review of Systems  Constitutional: Negative.   HENT: Positive for mouth sores.   Eyes: Negative.   Respiratory: Negative.   Gastrointestinal: Negative.   Genitourinary: Positive for difficulty urinating, dysuria and urgency. Negative for flank pain.    Allergies as of 06/09/2019      Reactions   Ciprofloxacin Itching   Codeine Nausea Only      Medication List       Accurate as of June 09, 2019 11:59 PM. If you have any questions, ask your nurse or doctor.        alendronate 70 MG tablet Commonly known as: FOSAMAX TAKE 1 TABLET BY MOUTH ONCE A WEEK WITH A FULL GLASS OF WATER ON AN EMPTY STOMACH   ALPRAZolam 0.5 MG tablet Commonly known as: XANAX Take 1 tablet  (0.5 mg total) by mouth at bedtime as needed.   aspirin EC 81 MG tablet Take 81 mg by mouth daily.   Biotin 10000 MCG Tabs Take 10,000 mcg by mouth daily.   CALCIUM CITRATE PO Take 1,200 mg by mouth 2 (two) times daily.   cetirizine 10 MG tablet Commonly known as: ZYRTEC Take 1-2 tablets 2 times a day max 40mg    cyclobenzaprine 5 MG tablet Commonly known as: FLEXERIL Take 1 tablet (5 mg total) by mouth 3 (three) times daily as needed for muscle spasms. What changed: when to take this   EPINEPHrine 0.3 mg/0.3 mL Soaj injection Commonly known as: EPI-PEN Use as directed for severe allergic reaction   famotidine 20 MG tablet Commonly known as: PEPCID Take 1 tablet (20 mg total) by mouth daily. Take 1 tablet twice a day What changed: when to take this   fluorouracil 5 % cream Commonly known as: EFUDEX Apply 1 application topically daily as needed (cancer spots).   Heliocare 240 MG Caps Generic drug: Polypodium Leucotomos Take by mouth daily. Patient states that she takes 2 daily.   HYDROcodone-acetaminophen 5-325 MG tablet Commonly known as: NORCO/VICODIN Take 1 tablet by mouth 3 (three) times daily as needed for moderate pain.   hydrocortisone cream 1 % Apply 1 application  topically daily as needed for itching.   hydrOXYzine 25 MG tablet Commonly known as: ATARAX/VISTARIL TAKE 1 TABLET THREE TIMES DAILY AS NEEDED FOR ITCHING   levothyroxine 125 MCG tablet Commonly known as: SYNTHROID Take 1 tablet (125 mcg total) by mouth daily.   loperamide 2 MG capsule Commonly known as: IMODIUM TAKE 2 CAPSULES 4 TIMES DAILY AS NEEDED   losartan 25 MG tablet Commonly known as: COZAAR Take 1 tablet (25 mg total) by mouth daily.   magnesium oxide 400 MG tablet Commonly known as: MAG-OX Take 400 mg by mouth daily.   metoprolol succinate 25 MG 24 hr tablet Commonly known as: Toprol XL Take 1 tablet (25 mg total) by mouth daily.   montelukast 10 MG tablet Commonly known  as: SINGULAIR Take 1 tablet (10 mg total) by mouth at bedtime.   pantoprazole 40 MG tablet Commonly known as: PROTONIX TAKE 1 TABLET BY MOUTH ONCE DAILY BEFORE BREAKFAST   PROBIOTIC-10 PO Take by mouth daily.   sulfamethoxazole-trimethoprim 800-160 MG tablet Commonly known as: Bactrim DS Take 1 tablet by mouth 2 (two) times daily. Started by: Terald Sleeper, PA-C   ursodiol 250 MG tablet Commonly known as: ACTIGALL Take 300 mg by mouth 2 (two) times daily.   valACYclovir 500 MG tablet Commonly known as: Valtrex Take 1 tablet (500 mg total) by mouth 3 (three) times daily. Started by: Terald Sleeper, PA-C   vitamin B-12 1000 MCG tablet Commonly known as: CYANOCOBALAMIN Take 1,000 mcg by mouth daily.   Vitamin D (Ergocalciferol) 1.25 MG (50000 UT) Caps capsule Commonly known as: DRISDOL Take 1 capsule (50,000 Units total) by mouth once a week.   Vitamin D3 75 MCG (3000 UT) Tabs Take by mouth.   vitamin E 400 UNIT capsule Generic drug: vitamin E Take 400 Units by mouth daily.   Zortress 0.5 MG Tabs Generic drug: Everolimus TAKE 5 TABLETS BY MOUTH EVERY 12 HOURS          Objective:    BP 124/77   Pulse 76   Temp (!) 96.6 F (35.9 C) (Tympanic)   Ht 5\' 6"  (1.676 m)   Wt 149 lb (67.6 kg)   BMI 24.05 kg/m   Allergies  Allergen Reactions  . Ciprofloxacin Itching  . Codeine Nausea Only    Wt Readings from Last 3 Encounters:  06/09/19 149 lb (67.6 kg)  04/30/19 151 lb 9 oz (68.7 kg)  04/28/19 149 lb 1.6 oz (67.6 kg)    Physical Exam Constitutional:      Appearance: She is well-developed.  HENT:     Head: Normocephalic and atraumatic.      Mouth/Throat:     Lips: Lesions present.     Comments: Blisters on lower lip Eyes:     Conjunctiva/sclera: Conjunctivae normal.     Pupils: Pupils are equal, round, and reactive to light.  Cardiovascular:     Rate and Rhythm: Normal rate and regular rhythm.     Heart sounds: Normal heart sounds.  Pulmonary:      Effort: Pulmonary effort is normal.     Breath sounds: Normal breath sounds.  Abdominal:     General: Bowel sounds are normal. There is no distension.     Palpations: Abdomen is soft. There is no mass.     Tenderness: There is abdominal tenderness in the suprapubic area. There is no guarding or rebound.  Skin:    General: Skin is warm and dry.     Findings: No  rash.  Neurological:     Mental Status: She is alert and oriented to person, place, and time.     Deep Tendon Reflexes: Reflexes are normal and symmetric.  Psychiatric:        Behavior: Behavior normal.        Thought Content: Thought content normal.        Judgment: Judgment normal.     Results for orders placed or performed in visit on 06/09/19  Urine Culture   Specimen: Urine   UR  Result Value Ref Range   Urine Culture, Routine Final report    Organism ID, Bacteria Comment   Microscopic Examination   URINE  Result Value Ref Range   WBC, UA >30 (A) 0 - 5 /hpf   RBC 11-30 (A) 0 - 2 /hpf   Epithelial Cells (non renal) 0-10 0 - 10 /hpf   Renal Epithel, UA 0-10 (A) None seen /hpf   Bacteria, UA Many (A) None seen/Few  Urinalysis, Complete  Result Value Ref Range   Specific Gravity, UA 1.025 1.005 - 1.030   pH, UA 7.0 5.0 - 7.5   Color, UA Amber (A) Yellow   Appearance Ur Cloudy (A) Clear   Leukocytes,UA 3+ (A) Negative   Protein,UA 2+ (A) Negative/Trace   Glucose, UA Negative Negative   Ketones, UA Negative Negative   RBC, UA 2+ (A) Negative   Bilirubin, UA Negative Negative   Urobilinogen, Ur 0.2 0.2 - 1.0 mg/dL   Nitrite, UA Positive (A) Negative   Microscopic Examination See below:       Assessment & Plan:   1. Acute cystitis without hematuria - sulfamethoxazole-trimethoprim (BACTRIM DS) 800-160 MG tablet; Take 1 tablet by mouth 2 (two) times daily.  Dispense: 20 tablet; Refill: 0  2. Fever blister - valACYclovir (VALTREX) 500 MG tablet; Take 1 tablet (500 mg total) by mouth 3 (three) times daily.   Dispense: 30 tablet; Refill: 1   Continue all other maintenance medications as listed above.  Follow up plan: No follow-ups on file.  Educational handout given for Burt PA-C [place] [address]  [place], [place] [address] [phone]   06/11/2019, 1:52 PM

## 2019-06-23 ENCOUNTER — Ambulatory Visit (INDEPENDENT_AMBULATORY_CARE_PROVIDER_SITE_OTHER): Payer: Medicare Other | Admitting: *Deleted

## 2019-06-23 ENCOUNTER — Encounter: Payer: Self-pay | Admitting: *Deleted

## 2019-06-23 DIAGNOSIS — Z0000 Encounter for general adult medical examination without abnormal findings: Secondary | ICD-10-CM

## 2019-06-23 NOTE — Patient Instructions (Signed)
Preventive Care 68 Years and Older, Female Preventive care refers to lifestyle choices and visits with your health care provider that can promote health and wellness. This includes:  A yearly physical exam. This is also called an annual well check.  Regular dental and eye exams.  Immunizations.  Screening for certain conditions.  Healthy lifestyle choices, such as diet and exercise. What can I expect for my preventive care visit? Physical exam Your health care provider will check:  Height and weight. These may be used to calculate body mass index (BMI), which is a measurement that tells if you are at a healthy weight.  Heart rate and blood pressure.  Your skin for abnormal spots. Counseling Your health care provider may ask you questions about:  Alcohol, tobacco, and drug use.  Emotional well-being.  Home and relationship well-being.  Sexual activity.  Eating habits.  History of falls.  Memory and ability to understand (cognition).  Work and work Statistician.  Pregnancy and menstrual history. What immunizations do I need?  Influenza (flu) vaccine  This is recommended every year. Tetanus, diphtheria, and pertussis (Tdap) vaccine  You may need a Td booster every 10 years. Varicella (chickenpox) vaccine  You may need this vaccine if you have not already been vaccinated. Zoster (shingles) vaccine  You may need this after age 33. Pneumococcal conjugate (PCV13) vaccine  One dose is recommended after age 33. Pneumococcal polysaccharide (PPSV23) vaccine  One dose is recommended after age 72. Measles, mumps, and rubella (MMR) vaccine  You may need at least one dose of MMR if you were born in 1957 or later. You may also need a second dose. Meningococcal conjugate (MenACWY) vaccine  You may need this if you have certain conditions. Hepatitis A vaccine  You may need this if you have certain conditions or if you travel or work in places where you may be exposed  to hepatitis A. Hepatitis B vaccine  You may need this if you have certain conditions or if you travel or work in places where you may be exposed to hepatitis B. Haemophilus influenzae type b (Hib) vaccine  You may need this if you have certain conditions. You may receive vaccines as individual doses or as more than one vaccine together in one shot (combination vaccines). Talk with your health care provider about the risks and benefits of combination vaccines. What tests do I need? Blood tests  Lipid and cholesterol levels. These may be checked every 5 years, or more frequently depending on your overall health.  Hepatitis C test.  Hepatitis B test. Screening  Lung cancer screening. You may have this screening every year starting at age 39 if you have a 30-pack-year history of smoking and currently smoke or have quit within the past 15 years.  Colorectal cancer screening. All adults should have this screening starting at age 36 and continuing until age 15. Your health care provider may recommend screening at age 23 if you are at increased risk. You will have tests every 1-10 years, depending on your results and the type of screening test.  Diabetes screening. This is done by checking your blood sugar (glucose) after you have not eaten for a while (fasting). You may have this done every 1-3 years.  Mammogram. This may be done every 1-2 years. Talk with your health care provider about how often you should have regular mammograms.  BRCA-related cancer screening. This may be done if you have a family history of breast, ovarian, tubal, or peritoneal cancers.  Other tests  Sexually transmitted disease (STD) testing.  Bone density scan. This is done to screen for osteoporosis. You may have this done starting at age 4. Follow these instructions at home: Eating and drinking  Eat a diet that includes fresh fruits and vegetables, whole grains, lean protein, and low-fat dairy products. Limit  your intake of foods with high amounts of sugar, saturated fats, and salt.  Take vitamin and mineral supplements as recommended by your health care provider.  Do not drink alcohol if your health care provider tells you not to drink.  If you drink alcohol: ? Limit how much you have to 0-1 drink a day. ? Be aware of how much alcohol is in your drink. In the [place] one drink equals one 12 oz bottle of beer (355 mL), one 5 oz glass of wine (148 mL), or one 1 oz glass of hard liquor (44 mL). Lifestyle  Take daily care of your teeth and gums.  Stay active. Exercise for at least 30 minutes on 5 or more days each week.  Do not use any products that contain nicotine or tobacco, such as cigarettes, e-cigarettes, and chewing tobacco. If you need help quitting, ask your health care provider.  If you are sexually active, practice safe sex. Use a condom or other form of protection in order to prevent STIs (sexually transmitted infections).  Talk with your health care provider about taking a low-dose aspirin or statin. What's next?  Go to your health care provider once a year for a well check visit.  Ask your health care provider how often you should have your eyes and teeth checked.  Stay up to date on all vaccines. This information is not intended to replace advice given to you by your health care provider. Make sure you discuss any questions you have with your health care provider. Document Released: 11/25/2015 Document Revised: 10/23/2018 Document Reviewed: 10/23/2018 Elsevier Patient Education  2020 [employer].

## 2019-06-23 NOTE — Progress Notes (Addendum)
MEDICARE ANNUAL WELLNESS VISIT  06/23/2019  Telephone Visit Disclaimer This Medicare AWV was conducted by telephone due to national recommendations for restrictions regarding the COVID-19 Pandemic (e.g. social distancing).  I verified, using two identifiers, that I am speaking with Cassidy Barber or their authorized healthcare agent. I discussed the limitations, risks, security, and privacy concerns of performing an evaluation and management service by telephone and the potential availability of an in-person appointment in the future. The patient expressed understanding and agreed to proceed.   Subjective:  Cassidy Barber is a 68 y.o. female patient of Hawks, Theador Hawthorne, FNP who had a Medicare Annual Wellness Visit today via telephone. Cassidy Barber is Disabled and lives with their spouse. she has 1 child. she reports that she is socially active and does interact with friends/family regularly. she is minimally physically active and enjoys making signs, bows and wreaths.  Patient Care Team: [name], FNP as PCP - General (Family Medicine) Eustace Moore, MD as Consulting Physician (Neurosurgery)  Advanced Directives 06/23/2019 05/18/2019 05/11/2019 04/30/2019 10/28/2018 09/16/2018 09/05/2018  Does Patient Have a Medical Advance Directive? No No No No No No No  Would patient like information on creating a medical advance directive? No - Patient declined No - Patient declined No - Patient declined - No - Patient declined No - Patient declined No - Patient declined  Pre-existing out of facility DNR order (yellow form or pink MOST form) - - - - - - -    Hospital Utilization Over the Past 12 Months: # of hospitalizations or ER visits: 0 # of surgeries: 0  Review of Systems    Patient reports that her overall health is unchanged compared to last year.  Patient Reported Readings (BP, Pulse, CBG, Weight, etc) none  Review of Systems: No complaints  All other systems negative.  Pain Assessment  Pain : No/denies pain     Current Medications & Allergies (verified) Allergies as of 06/23/2019      Reactions   Ciprofloxacin Itching   Codeine Nausea Only      Medication List       Accurate as of June 23, 2019  8:43 AM. If you have any questions, ask your nurse or doctor.        STOP taking these medications   loperamide 2 MG capsule Commonly known as: IMODIUM Stopped by: WYATT, AMY M, RN   sulfamethoxazole-trimethoprim 800-160 MG tablet Commonly known as: Bactrim DS Stopped by: WYATT, AMY M, RN     TAKE these medications   alendronate 70 MG tablet Commonly known as: FOSAMAX TAKE 1 TABLET BY MOUTH ONCE A WEEK WITH A FULL GLASS OF WATER ON AN EMPTY STOMACH   ALPRAZolam 0.5 MG tablet Commonly known as: XANAX Take 1 tablet (0.5 mg total) by mouth at bedtime as needed.   aspirin EC 81 MG tablet Take 81 mg by mouth daily.   Biotin 10000 MCG Tabs Take 10,000 mcg by mouth daily.   CALCIUM CITRATE PO Take 1,200 mg by mouth 2 (two) times daily.   cetirizine 10 MG tablet Commonly known as: ZYRTEC Take 1-2 tablets 2 times a day max 40mg    cyclobenzaprine 5 MG tablet Commonly known as: FLEXERIL Take 1 tablet (5 mg total) by mouth 3 (three) times daily as needed for muscle spasms. What changed: when to take this   EPINEPHrine 0.3 mg/0.3 mL Soaj injection Commonly known as: EPI-PEN Use as directed for severe allergic reaction   famotidine  20 MG tablet Commonly known as: PEPCID Take 1 tablet (20 mg total) by mouth daily. Take 1 tablet twice a day What changed: when to take this   fluorouracil 5 % cream Commonly known as: EFUDEX Apply 1 application topically daily as needed (cancer spots).   Heliocare 240 MG Caps Generic drug: Polypodium Leucotomos Take by mouth daily. Patient states that she takes 2 daily.   HYDROcodone-acetaminophen 5-325 MG tablet Commonly known as: NORCO/VICODIN Take 1 tablet by mouth 3 (three) times daily as needed for moderate  pain.   hydrocortisone cream 1 % Apply 1 application topically daily as needed for itching.   hydrOXYzine 25 MG tablet Commonly known as: ATARAX/VISTARIL TAKE 1 TABLET THREE TIMES DAILY AS NEEDED FOR ITCHING   levothyroxine 125 MCG tablet Commonly known as: SYNTHROID Take 1 tablet (125 mcg total) by mouth daily.   losartan 25 MG tablet Commonly known as: COZAAR Take 1 tablet (25 mg total) by mouth daily.   magnesium oxide 400 MG tablet Commonly known as: MAG-OX Take 400 mg by mouth daily.   metoprolol succinate 25 MG 24 hr tablet Commonly known as: Toprol XL Take 1 tablet (25 mg total) by mouth daily.   montelukast 10 MG tablet Commonly known as: SINGULAIR Take 1 tablet (10 mg total) by mouth at bedtime.   pantoprazole 40 MG tablet Commonly known as: PROTONIX TAKE 1 TABLET BY MOUTH ONCE DAILY BEFORE BREAKFAST   PROBIOTIC-10 PO Take by mouth daily.   ursodiol 250 MG tablet Commonly known as: ACTIGALL Take 300 mg by mouth 2 (two) times daily.   valACYclovir 500 MG tablet Commonly known as: Valtrex Take 1 tablet (500 mg total) by mouth 3 (three) times daily.   vitamin B-12 1000 MCG tablet Commonly known as: CYANOCOBALAMIN Take 1,000 mcg by mouth daily.   Vitamin D (Ergocalciferol) 1.25 MG (50000 UT) Caps capsule Commonly known as: DRISDOL Take 1 capsule (50,000 Units total) by mouth once a week.   Vitamin D3 75 MCG (3000 UT) Tabs Take by mouth.   vitamin E 400 UNIT capsule Generic drug: vitamin E Take 400 Units by mouth daily.   Zortress 0.5 MG Tabs Generic drug: Everolimus TAKE 5 TABLETS BY MOUTH EVERY 12 HOURS       History (reviewed): Past Medical History:  Diagnosis Date  . Anemia of chronic disease 10/26/2013  . Hernia of unspecified site of abdominal cavity without mention of obstruction or gangrene   . Hypertension   . Liver disease   . Lynch syndrome   . Lynch syndrome   . Primary biliary cirrhosis ([place]) 10/26/2013  . Thyroid disease     Past Surgical History:  Procedure Laterality Date  . ABDOMINAL HERNIA REPAIR     Patient's states that she has had 8- 9 hernia surgeries  . ABDOMINAL HYSTERECTOMY    . CHOLECYSTECTOMY  2007  . COLON RESECTION    . COLON SURGERY  2008   Done at [facility]  . COLONOSCOPY     Done at [facility]  . ESOPHAGOGASTRODUODENOSCOPY N/A 08/21/2018   Procedure: ESOPHAGOGASTRODUODENOSCOPY (EGD);  Surgeon: Rogene Houston, MD;  [facility];  Service: Endoscopy;  Laterality: N/A;  . EYE SURGERY     lasix  . FLEXIBLE SIGMOIDOSCOPY N/A 10/20/2015   Procedure: FLEXIBLE SIGMOIDOSCOPY;  Surgeon: Rogene Houston, MD;  [facility];  Service: Endoscopy;  Laterality: N/A;  74 - Dr Laural Golden has meeting until 1:00  . FLEXIBLE SIGMOIDOSCOPY N/A 07/11/2016   Procedure:  FLEXIBLE SIGMOIDOSCOPY;  Surgeon: Rogene Houston, MD;  [facility];  Service: Endoscopy;  Laterality: N/A;  1200  . FLEXIBLE SIGMOIDOSCOPY N/A 08/09/2017   Procedure: FLEXIBLE SIGMOIDOSCOPY;  Surgeon: Rogene Houston, MD;  [facility];  Service: Endoscopy;  Laterality: N/A;  1:00  . FLEXIBLE SIGMOIDOSCOPY N/A 08/21/2018   Procedure: FLEXIBLE SIGMOIDOSCOPY;  Surgeon: Rogene Houston, MD;  [facility];  Service: Endoscopy;  Laterality: N/A;  . HERNIA REPAIR    . LIVER TRANSPLANT  [id number]  . multiple skin cancers removed    . POLYPECTOMY  08/09/2017   Procedure: POLYPECTOMY;  Surgeon: Rogene Houston, MD;  [facility];  Service: Endoscopy;;  colon small bowel  . REVERSE SHOULDER ARTHROPLASTY Left 07/17/2018  . REVERSE SHOULDER ARTHROPLASTY Left 07/17/2018   Procedure: LEFT REVERSE SHOULDER ARTHROPLASTY;  Surgeon: Justice Britain, MD;  Location: [facility];  Service: Orthopedics;  Laterality: Left;  124min  . SPLENECTOMY  2006  . TYMPANOSTOMY TUBE PLACEMENT    . UPPER GASTROINTESTINAL ENDOSCOPY     Done at [facility]   Family History  Problem Relation Age of Onset  . Prostate cancer Father   . Colon  cancer Father   . Colon cancer Sister   . Lung cancer Sister   . Healthy Son   . Alcohol abuse Brother   . Allergic rhinitis Neg Hx   . Asthma Neg Hx   . Eczema Neg Hx   . Urticaria Neg Hx    Social History   Socioeconomic History  . Marital status: Married    Spouse name: Charlotte Crumb  . Number of children: 1  . Years of education: 44  . Highest education level: High school graduate  Occupational History  . Occupation: Disability    Employer: [employer]    Comment: Multimedia programmer  Social Needs  . Financial resource strain: Not hard at all  . Food insecurity    Worry: Never true    Inability: Never true  . Transportation needs    Medical: No    Non-medical: No  Tobacco Use  . Smoking status: Never Smoker  . Smokeless tobacco: Never Used  Substance and Sexual Activity  . Alcohol use: No    Alcohol/week: 0.0 standard drinks  . Drug use: No  . Sexual activity: Yes    Birth control/protection: None  Lifestyle  . Physical activity    Days per week: 0 days    Minutes per session: 0 min  . Stress: Not at all  Relationships  . Social connections    Talks on phone: More than three times a week    Gets together: More than three times a week    Attends religious service: More than 4 times per year    Active member of club or organization: Yes    Attends meetings of clubs or organizations: More than 4 times per year    Relationship status: Married  Other Topics Concern  . Not on file  Social History Narrative   Patient lives in a two story home with her husband. She has an adult son. She is retired from being an Web designer for 30 years.     Activities of Daily Living In your present state of health, do you have any difficulty performing the following activities: 06/23/2019 07/17/2018  Hearing? N N  Vision? Y N  Comment sometimes her vision is blurry due to her cataracts, has yearly eye exams -  Difficulty concentrating  or making decisions? N Y   Comment - "age related"  Walking or climbing stairs? N N  Dressing or bathing? N N  Doing errands, shopping? N N  Preparing Food and eating ? N -  Using the Toilet? N -  In the past six months, have you accidently leaked urine? N -  Do you have problems with loss of bowel control? N -  Managing your Medications? N -  Managing your Finances? N -  Housekeeping or managing your Housekeeping? N -  Some recent data might be hidden    Patient Education/ Literacy How often do you need to have someone help you when you read instructions, pamphlets, or other written materials from your doctor or pharmacy?: 1 - Never What is the last grade level you completed in school?: 12th grade  Exercise Current Exercise Habits: The patient does not participate in regular exercise at present, Exercise limited by: None identified  Diet Patient reports consuming 2 meals a day and 1 snack(s) a day Patient reports that her primary diet is: Regular Patient reports that she does have regular access to food.   Depression Screen PHQ 2/9 Scores 06/23/2019 06/09/2019 04/13/2019 01/15/2019 11/17/2018 10/29/2018 09/08/2018  PHQ - 2 Score 0 0 0 0 0 0 1     Fall Risk Fall Risk  06/23/2019 06/09/2019 04/13/2019 01/15/2019 11/17/2018  Falls in the past year? 0 0 0 1 1  Number falls in past yr: - - - 1 1  Injury with Fall? - - - 0 0  Comment - - - - -     Objective:  Cassidy Barber seemed alert and oriented and she participated appropriately during our telephone visit.  Blood Pressure Weight BMI  BP Readings from Last 3 Encounters:  06/09/19 124/77  05/18/19 124/65  05/11/19 137/71   Wt Readings from Last 3 Encounters:  06/09/19 149 lb (67.6 kg)  04/30/19 151 lb 9 oz (68.7 kg)  04/28/19 149 lb 1.6 oz (67.6 kg)   BMI Readings from Last 1 Encounters:  06/09/19 24.05 kg/m    *Unable to obtain current vital signs, weight, and BMI due to telephone visit type  Hearing/Vision  . Cassidy Barber did not seem to have difficulty with  hearing/understanding during the telephone conversation . Reports that she has had a formal eye exam by an eye care professional within the past year . Reports that she has not had a formal hearing evaluation within the past year *Unable to fully assess hearing and vision during telephone visit type  Cognitive Function: 6CIT Screen 06/23/2019  What Year? 0 points  What month? 0 points  What time? 0 points  Count back from 20 0 points  Months in reverse 0 points  Repeat phrase 2 points  Total Score 2   (Normal:0-7, Significant for Dysfunction: >8)  Normal Cognitive Function Screening: Yes   Immunization & Health Maintenance Record Immunization History  Administered Date(s) Administered  . Influenza Split 08/06/2011, 09/18/2012, 09/13/2015  . Influenza, High Dose Seasonal PF 07/18/2018  . Influenza,inj,Quad PF,6+ Mos 08/07/2010, 07/31/2013, 08/16/2014, 09/01/2015  . Influenza-Unspecified 07/13/2013, 09/01/2015, 08/27/2016, 07/26/2017  . Pneumococcal Conjugate-13 09/01/2015, 07/26/2017  . Pneumococcal Polysaccharide-23 08/06/2011, 07/13/2017  . Pneumococcal-Unspecified 07/13/2012  . Tdap 08/28/2016, 03/31/2018  . Zoster Recombinat (Shingrix) 04/21/2018, 06/23/2018    Health Maintenance  Topic Date Due  . INFLUENZA VACCINE  06/13/2019  . MAMMOGRAM  01/02/2021  . COLONOSCOPY  08/12/2024  . TETANUS/TDAP  03/31/2028  . DEXA SCAN  Completed  .  Hepatitis C Screening  Completed  . PNA vac Low Risk Adult  Completed       Assessment  This is a routine wellness examination for [place] Maintenance: Due or Overdue Health Maintenance Due  Topic Date Due  . INFLUENZA VACCINE  06/13/2019    Cassidy Barber does not need a referral for Community Assistance: Care Management:   no Social Work:    no Prescription Assistance:  no Nutrition/Diabetes Education:  no   Plan:  Personalized Goals Goals Addressed            This Visit's Progress   . DIET - INCREASE  WATER INTAKE       Try to drink 6-8 glasses of water daily.      Personalized Health Maintenance & Screening Recommendations  Influenza vaccine  Lung Cancer Screening Recommended: no (Low Dose CT Chest recommended if Age 77-80 years, 30 pack-year currently smoking OR have quit w/in past 15 years) Hepatitis C Screening recommended: no HIV Screening recommended: no  Advanced Directives: Written information was not prepared per patient's request.  Referrals & Orders No orders of the defined types were placed in this encounter.   Follow-up Plan . Follow-up with [name], FNP as planned   I have personally reviewed and noted the following in the patient's chart:   . Medical and social history . Use of alcohol, tobacco or illicit drugs  . Current medications and supplements . Functional ability and status . Nutritional status . Physical activity . Advanced directives . List of other physicians . Hospitalizations, surgeries, and ER visits in previous 12 months . Vitals . Screenings to include cognitive, depression, and falls . Referrals and appointments  In addition, I have reviewed and discussed with Cassidy Barber certain preventive protocols, quality metrics, and best practice recommendations. A written personalized care plan for preventive services as well as general preventive health recommendations is available and can be mailed to the patient at her request.      Marylin Crosby, LPN  0/98/1191    I have reviewed and agree with the above AWV documentation.   [name], FNP

## 2019-06-25 ENCOUNTER — Other Ambulatory Visit: Payer: Self-pay | Admitting: Physician Assistant

## 2019-06-25 DIAGNOSIS — B001 Herpesviral vesicular dermatitis: Secondary | ICD-10-CM

## 2019-06-27 ENCOUNTER — Telehealth: Payer: Medicare Other | Admitting: Physician Assistant

## 2019-06-27 DIAGNOSIS — R300 Dysuria: Secondary | ICD-10-CM | POA: Diagnosis not present

## 2019-06-27 MED ORDER — CEPHALEXIN 500 MG PO CAPS: 500.0000 mg | ORAL_CAPSULE | Freq: Two times a day (BID) | ORAL | 0 refills | Status: AC

## 2019-06-27 NOTE — Progress Notes (Signed)

## 2019-06-27 NOTE — Progress Notes (Signed)
I have spent 5 minutes in review of e-visit questionnaire, review and updating patient chart, medical decision making and response to patient.   Hermann Dottavio Cody Briton Sellman, PA-C    

## 2019-06-29 ENCOUNTER — Other Ambulatory Visit: Payer: Self-pay

## 2019-06-29 ENCOUNTER — Other Ambulatory Visit: Payer: Medicare Other

## 2019-06-29 DIAGNOSIS — Z944 Liver transplant status: Secondary | ICD-10-CM | POA: Diagnosis not present

## 2019-06-30 DIAGNOSIS — M79671 Pain in right foot: Secondary | ICD-10-CM | POA: Diagnosis not present

## 2019-06-30 DIAGNOSIS — M722 Plantar fascial fibromatosis: Secondary | ICD-10-CM | POA: Diagnosis not present

## 2019-07-01 ENCOUNTER — Other Ambulatory Visit: Payer: Self-pay

## 2019-07-01 ENCOUNTER — Emergency Department (HOSPITAL_COMMUNITY)
Admission: EM | Admit: 2019-07-01 | Discharge: 2019-07-01 | Disposition: A | Discharge status: Home / Self Care | Payer: Medicare Other | Attending: Emergency Medicine | Admitting: Emergency Medicine

## 2019-07-01 ENCOUNTER — Encounter (HOSPITAL_COMMUNITY): Payer: Self-pay | Admitting: *Deleted

## 2019-07-01 ENCOUNTER — Emergency Department (HOSPITAL_COMMUNITY): Payer: Medicare Other

## 2019-07-01 DIAGNOSIS — Z7982 Long term (current) use of aspirin: Secondary | ICD-10-CM | POA: Diagnosis not present

## 2019-07-01 DIAGNOSIS — Y939 Activity, unspecified: Secondary | ICD-10-CM | POA: Insufficient documentation

## 2019-07-01 DIAGNOSIS — Z944 Liver transplant status: Secondary | ICD-10-CM | POA: Insufficient documentation

## 2019-07-01 DIAGNOSIS — R52 Pain, unspecified: Secondary | ICD-10-CM | POA: Diagnosis not present

## 2019-07-01 DIAGNOSIS — R079 Chest pain, unspecified: Secondary | ICD-10-CM | POA: Diagnosis not present

## 2019-07-01 DIAGNOSIS — I10 Essential (primary) hypertension: Secondary | ICD-10-CM | POA: Insufficient documentation

## 2019-07-01 DIAGNOSIS — R000 Tachycardia, unspecified: Secondary | ICD-10-CM | POA: Diagnosis not present

## 2019-07-01 DIAGNOSIS — S51011A Laceration without foreign body of right elbow, initial encounter: Secondary | ICD-10-CM

## 2019-07-01 DIAGNOSIS — S52251A Displaced comminuted fracture of shaft of ulna, right arm, initial encounter for closed fracture: Secondary | ICD-10-CM | POA: Diagnosis not present

## 2019-07-01 DIAGNOSIS — Y999 Unspecified external cause status: Secondary | ICD-10-CM | POA: Insufficient documentation

## 2019-07-01 DIAGNOSIS — Z23 Encounter for immunization: Secondary | ICD-10-CM | POA: Diagnosis not present

## 2019-07-01 DIAGNOSIS — Z79899 Other long term (current) drug therapy: Secondary | ICD-10-CM | POA: Insufficient documentation

## 2019-07-01 DIAGNOSIS — Y92481 Parking lot as the place of occurrence of the external cause: Secondary | ICD-10-CM | POA: Insufficient documentation

## 2019-07-01 DIAGNOSIS — S59911A Unspecified injury of right forearm, initial encounter: Secondary | ICD-10-CM | POA: Diagnosis present

## 2019-07-01 DIAGNOSIS — S51811A Laceration without foreign body of right forearm, initial encounter: Secondary | ICD-10-CM

## 2019-07-01 DIAGNOSIS — R0902 Hypoxemia: Secondary | ICD-10-CM | POA: Diagnosis not present

## 2019-07-01 DIAGNOSIS — S52691A Other fracture of lower end of right ulna, initial encounter for closed fracture: Secondary | ICD-10-CM | POA: Insufficient documentation

## 2019-07-01 DIAGNOSIS — S52201A Unspecified fracture of shaft of right ulna, initial encounter for closed fracture: Secondary | ICD-10-CM

## 2019-07-01 MED ORDER — HYDROCODONE-ACETAMINOPHEN 5-325 MG PO TABS: 1.0000 | ORAL_TABLET | Freq: Four times a day (QID) | ORAL | 0 refills | Status: DC | PRN

## 2019-07-01 MED ORDER — LIDOCAINE-EPINEPHRINE (PF) 2 %-1:200000 IJ SOLN
20.0000 mL | Freq: Once | INTRAMUSCULAR | Status: AC
  Administered 2019-07-01: 20 mL
  Filled 2019-07-01: qty 20

## 2019-07-01 MED ORDER — ONDANSETRON 4 MG PO TBDP: 4.0000 mg | ORAL_TABLET | Freq: Three times a day (TID) | ORAL | 1 refills | Status: DC | PRN

## 2019-07-01 MED ORDER — TETANUS-DIPHTH-ACELL PERTUSSIS 5-2.5-18.5 LF-MCG/0.5 IM SUSP
0.5000 mL | Freq: Once | INTRAMUSCULAR | Status: AC
  Administered 2019-07-01: 0.5 mL via INTRAMUSCULAR
  Filled 2019-07-01: qty 0.5

## 2019-07-01 MED ORDER — LIDOCAINE HCL (PF) 1 % IJ SOLN
5.0000 mL | Freq: Once | INTRAMUSCULAR | Status: DC
  Filled 2019-07-01: qty 6

## 2019-07-01 MED ORDER — HYDROCODONE-ACETAMINOPHEN 5-325 MG PO TABS
1.0000 | ORAL_TABLET | Freq: Once | ORAL | Status: AC
  Administered 2019-07-01: 14:00:00 1 via ORAL
  Filled 2019-07-01: qty 1

## 2019-07-01 MED ORDER — FENTANYL CITRATE (PF) 100 MCG/2ML IJ SOLN
25.0000 ug | Freq: Once | INTRAMUSCULAR | Status: AC
  Administered 2019-07-01: 11:00:00 25 ug via INTRAVENOUS
  Filled 2019-07-01: qty 2

## 2019-07-01 NOTE — ED Provider Notes (Signed)
..  Laceration Repair  Date/Time: 07/01/2019 12:44 PM Performed by: Jacqlyn Larsen, PA-C Authorized by: Jacqlyn Larsen, PA-C   Consent:    Consent obtained:  Verbal   Consent given by:  Patient   Risks discussed:  Infection, pain, poor cosmetic result, poor wound healing and need for additional repair   Alternatives discussed:  No treatment Anesthesia (see MAR for exact dosages):    Anesthesia method:  Local infiltration   Local anesthetic:  Lidocaine 2% WITH epi Laceration details:    Location:  Shoulder/arm   Shoulder/arm location:  R lower arm   Length (cm):  12   Depth (mm):  10 Repair type:    Repair type:  Simple Pre-procedure details:    Preparation:  Patient was prepped and draped in usual sterile fashion and imaging obtained to evaluate for foreign bodies Exploration:    Hemostasis achieved with:  Epinephrine and direct pressure   Wound exploration: wound explored through full range of motion and entire depth of wound probed and visualized     Wound extent: areolar tissue violated   Treatment:    Area cleansed with:  Shur-Clens   Amount of cleaning:  Extensive Skin repair:    Repair method:  Sutures   Suture size:  3-0   Suture material:  Prolene   Suture technique:  Simple interrupted   Number of sutures:  11 Approximation:    Approximation:  Close Post-procedure details:    Dressing:  Non-adherent dressing and splint for protection   Patient tolerance of procedure:  Tolerated well, no immediate complications      Janet Berlin 07/01/19 1247    Fredia Sorrow, MD 07/01/19 1328

## 2019-07-01 NOTE — ED Notes (Signed)
Returned from xray
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
## 2019-07-01 NOTE — Discharge Instructions (Signed)
Take the pain medication as needed.  Keep the splint in place and dry.  Call [facility] for follow-up of the ulnar fracture.  They also will continue with the wound care.
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
## 2019-07-01 NOTE — ED Notes (Signed)
Patient transported to X-ray 

## 2019-07-03 DIAGNOSIS — S52209A Unspecified fracture of shaft of unspecified ulna, initial encounter for closed fracture: Secondary | ICD-10-CM | POA: Insufficient documentation

## 2019-07-03 DIAGNOSIS — S52251A Displaced comminuted fracture of shaft of ulna, right arm, initial encounter for closed fracture: Secondary | ICD-10-CM | POA: Diagnosis not present

## 2019-07-08 DIAGNOSIS — S52201B Unspecified fracture of shaft of right ulna, initial encounter for open fracture type I or II: Secondary | ICD-10-CM | POA: Diagnosis not present

## 2019-07-08 DIAGNOSIS — S52251A Displaced comminuted fracture of shaft of ulna, right arm, initial encounter for closed fracture: Secondary | ICD-10-CM | POA: Diagnosis not present

## 2019-07-08 DIAGNOSIS — S52201A Unspecified fracture of shaft of right ulna, initial encounter for closed fracture: Secondary | ICD-10-CM | POA: Insufficient documentation

## 2019-07-14 DIAGNOSIS — Z0120 Encounter for dental examination and cleaning without abnormal findings: Secondary | ICD-10-CM | POA: Diagnosis not present

## 2019-07-21 DIAGNOSIS — S52251D Displaced comminuted fracture of shaft of ulna, right arm, subsequent encounter for closed fracture with routine healing: Secondary | ICD-10-CM | POA: Diagnosis not present

## 2019-07-21 DIAGNOSIS — M25639 Stiffness of unspecified wrist, not elsewhere classified: Secondary | ICD-10-CM | POA: Diagnosis not present

## 2019-07-21 DIAGNOSIS — Z4789 Encounter for other orthopedic aftercare: Secondary | ICD-10-CM | POA: Diagnosis not present

## 2019-08-03 ENCOUNTER — Other Ambulatory Visit: Payer: Self-pay | Admitting: Family

## 2019-08-05 ENCOUNTER — Other Ambulatory Visit: Payer: Self-pay | Admitting: Physician Assistant

## 2019-08-05 DIAGNOSIS — M25631 Stiffness of right wrist, not elsewhere classified: Secondary | ICD-10-CM | POA: Diagnosis not present

## 2019-08-05 DIAGNOSIS — C44629 Squamous cell carcinoma of skin of left upper limb, including shoulder: Secondary | ICD-10-CM | POA: Diagnosis not present

## 2019-08-05 DIAGNOSIS — C44529 Squamous cell carcinoma of skin of other part of trunk: Secondary | ICD-10-CM | POA: Diagnosis not present

## 2019-08-05 DIAGNOSIS — D045 Carcinoma in situ of skin of trunk: Secondary | ICD-10-CM | POA: Diagnosis not present

## 2019-08-05 DIAGNOSIS — D0472 Carcinoma in situ of skin of left lower limb, including hip: Secondary | ICD-10-CM | POA: Diagnosis not present

## 2019-08-13 DIAGNOSIS — M25639 Stiffness of unspecified wrist, not elsewhere classified: Secondary | ICD-10-CM | POA: Diagnosis not present

## 2019-08-14 ENCOUNTER — Encounter (INDEPENDENT_AMBULATORY_CARE_PROVIDER_SITE_OTHER): Payer: Self-pay | Admitting: *Deleted

## 2019-08-18 DIAGNOSIS — Z5189 Encounter for other specified aftercare: Secondary | ICD-10-CM | POA: Diagnosis not present

## 2019-08-18 DIAGNOSIS — S52251D Displaced comminuted fracture of shaft of ulna, right arm, subsequent encounter for closed fracture with routine healing: Secondary | ICD-10-CM | POA: Diagnosis not present

## 2019-08-20 ENCOUNTER — Other Ambulatory Visit: Payer: Self-pay

## 2019-08-20 DIAGNOSIS — M25639 Stiffness of unspecified wrist, not elsewhere classified: Secondary | ICD-10-CM | POA: Diagnosis not present

## 2019-08-21 ENCOUNTER — Other Ambulatory Visit: Payer: Self-pay

## 2019-08-21 ENCOUNTER — Other Ambulatory Visit: Payer: Medicare Other

## 2019-08-21 ENCOUNTER — Ambulatory Visit (INDEPENDENT_AMBULATORY_CARE_PROVIDER_SITE_OTHER): Payer: Medicare Other

## 2019-08-21 DIAGNOSIS — Z23 Encounter for immunization: Secondary | ICD-10-CM | POA: Diagnosis not present

## 2019-08-21 DIAGNOSIS — Z944 Liver transplant status: Secondary | ICD-10-CM | POA: Diagnosis not present

## 2019-08-21 DIAGNOSIS — D899 Disorder involving the immune mechanism, unspecified: Secondary | ICD-10-CM | POA: Diagnosis not present

## 2019-08-21 DIAGNOSIS — Z298 Encounter for other specified prophylactic measures: Secondary | ICD-10-CM | POA: Diagnosis not present

## 2019-08-27 DIAGNOSIS — M25631 Stiffness of right wrist, not elsewhere classified: Secondary | ICD-10-CM | POA: Diagnosis not present

## 2019-08-28 ENCOUNTER — Telehealth (INDEPENDENT_AMBULATORY_CARE_PROVIDER_SITE_OTHER): Payer: Self-pay | Admitting: *Deleted

## 2019-08-28 ENCOUNTER — Ambulatory Visit (INDEPENDENT_AMBULATORY_CARE_PROVIDER_SITE_OTHER): Payer: Medicare Other | Admitting: Family

## 2019-08-28 ENCOUNTER — Encounter: Payer: Self-pay | Admitting: Family

## 2019-08-28 ENCOUNTER — Other Ambulatory Visit: Payer: Self-pay

## 2019-08-28 VITALS — BP 124/72 | Temp 98.2°F | Ht 64.0 in | Wt 140.8 lb

## 2019-08-28 DIAGNOSIS — R0789 Other chest pain: Secondary | ICD-10-CM

## 2019-08-28 DIAGNOSIS — Z944 Liver transplant status: Secondary | ICD-10-CM | POA: Diagnosis not present

## 2019-08-28 DIAGNOSIS — K219 Gastro-esophageal reflux disease without esophagitis: Secondary | ICD-10-CM | POA: Diagnosis not present

## 2019-08-28 DIAGNOSIS — R1013 Epigastric pain: Secondary | ICD-10-CM

## 2019-08-28 MED ORDER — PANTOPRAZOLE SODIUM 40 MG PO TBEC: 40.0000 mg | DELAYED_RELEASE_TABLET | Freq: Two times a day (BID) | ORAL | 1 refills | Status: DC

## 2019-08-28 NOTE — Telephone Encounter (Signed)
Called patient to schedule FS - she stated she went to PCP today ([facility]) for chest pain EKG was ok, PCP suggested adding EGD at time of FS -- please advise

## 2019-08-28 NOTE — Patient Instructions (Signed)

## 2019-08-28 NOTE — Progress Notes (Signed)
Subjective:    Patient ID: Cassidy Barber, female    DOB: [birth year], 68 y.o.   MRN: [id number]  Chief Complaint  Patient presents with  . Chest Pain    with eating   PT presents to the office today for epigastric pain and chest pain that started last week. She states this pain only happens when she eats and she will stop eating and "take a break". She will eat tums that will mildly help. She has a hx of liver transplant and states her alk phos levels were elevated last weeks. She is scheduled for ERCP next month. She is followed by GI and has a colonoscopy scheduled next month.   She does report she has increased fatigue and had the SOB with doing laundry.   Chest Pain  This is a new problem. The current episode started in the past 7 days. The onset quality is gradual. The problem occurs intermittently. The problem has been resolved. The pain is present in the epigastric region. The pain is at a severity of 0/10 (when she eat or drinks  7-8). The pain is mild. The quality of the pain is described as stabbing. The pain radiates to the epigastrium. Associated symptoms include malaise/fatigue. Pertinent negatives include no back pain, cough, exertional chest pressure, fever, headaches, irregular heartbeat, lower extremity edema, nausea, palpitations, shortness of breath or vomiting. The pain is aggravated by food. She has tried antacids for the symptoms. The treatment provided mild relief.     Review of Systems  Constitutional: Positive for malaise/fatigue. Negative for fever.  HENT: Negative.   Eyes: Negative.   Respiratory: Negative.  Negative for cough and shortness of breath.   Cardiovascular: Positive for chest pain. Negative for palpitations.  Gastrointestinal: Negative.  Negative for nausea and vomiting.  Endocrine: Negative.   Genitourinary: Negative.   Musculoskeletal: Negative.  Negative for back pain.  Neurological: Negative.  Negative for headaches.  Hematological: Negative.    Psychiatric/Behavioral: Negative.   All other systems reviewed and are negative.      Objective:   Physical Exam Vitals signs reviewed.  Constitutional:      General: She is not in acute distress.    Appearance: She is well-developed.  HENT:     Head: Normocephalic and atraumatic.     Right Ear: External ear normal.  Eyes:     Pupils: Pupils are equal, round, and reactive to light.  Neck:     Musculoskeletal: Normal range of motion and neck supple.     Thyroid: No thyromegaly.  Cardiovascular:     Rate and Rhythm: Normal rate and regular rhythm.     Heart sounds: Normal heart sounds. No murmur.  Pulmonary:     Effort: Pulmonary effort is normal. No respiratory distress.     Breath sounds: Normal breath sounds. No wheezing.  Abdominal:     General: Bowel sounds are normal. There is no distension.     Palpations: Abdomen is soft.     Tenderness: There is no abdominal tenderness.  Musculoskeletal: Normal range of motion.        General: No tenderness.  Skin:    General: Skin is warm and dry.  Neurological:     Mental Status: She is alert and oriented to person, place, and time.     Cranial Nerves: No cranial nerve deficit.     Deep Tendon Reflexes: Reflexes are normal and symmetric.  Psychiatric:        Behavior: Behavior normal.  Thought Content: Thought content normal.        Judgment: Judgment normal.     Blood pressure 124/72, temperature 98.2 F (36.8 C), temperature source Temporal, height '5\' 4"'$  (1.626 m), weight 140 lb 12.8 oz (63.9 kg), SpO2 100 %.        Assessment & Plan:  Cassidy Barber comes in today with chief complaint of Chest Pain (with eating)   Diagnosis and orders addressed:  1. Epigastric pain - EKG 12-Lead - pantoprazole (PROTONIX) 40 MG tablet; Take 1 tablet (40 mg total) by mouth 2 (two) times daily.  Dispense: 180 tablet; Refill: 1 - Ambulatory referral to Cardiology  2. Gastroesophageal reflux disease, unspecified whether  esophagitis present Will increase Protonix to 40 mg BID from daily -Diet discussed- Avoid fried, spicy, citrus foods, caffeine and alcohol -Do not eat 2-3 hours before bedtime -Encouraged small frequent meals -Avoid NSAID's - pantoprazole (PROTONIX) 40 MG tablet; Take 1 tablet (40 mg total) by mouth 2 (two) times daily.  Dispense: 180 tablet; Refill: 1  3. Liver transplant recipient [facility])  4. Hx of liver transplant [facility])  Keep follow up with liver specialists and GI Keep colonoscopy appt, will more than likely need an EGD done at the same time. Will increase Protonix to 40 mg BID from 40 mg daily I do not believe this is related to the heart, but since she is having fatigue and SOB with laundry which is new and EKG changes will do Cardiologists referral.  Go to ED if pain worsens or does not improve.  [name], FNP

## 2019-08-30 NOTE — Telephone Encounter (Signed)
Okay to schedule EGD at the time of flexible sigmoidoscopy. Indication is chest pain possibly of GI origin. An order serum alkaline phosphatase was 305 last week. This will be repeated at the time of EGD unless has already been done.

## 2019-08-31 NOTE — Telephone Encounter (Signed)
EGD added to Flex Sig, patient aware

## 2019-09-03 DIAGNOSIS — M25631 Stiffness of right wrist, not elsewhere classified: Secondary | ICD-10-CM | POA: Diagnosis not present

## 2019-09-04 ENCOUNTER — Other Ambulatory Visit (INDEPENDENT_AMBULATORY_CARE_PROVIDER_SITE_OTHER): Payer: Self-pay | Admitting: *Deleted

## 2019-09-04 DIAGNOSIS — R079 Chest pain, unspecified: Secondary | ICD-10-CM

## 2019-09-04 DIAGNOSIS — Z85038 Personal history of other malignant neoplasm of large intestine: Secondary | ICD-10-CM

## 2019-09-04 DIAGNOSIS — Z1509 Genetic susceptibility to other malignant neoplasm: Secondary | ICD-10-CM

## 2019-09-04 DIAGNOSIS — Z86010 Personal history of colonic polyps: Secondary | ICD-10-CM

## 2019-09-14 ENCOUNTER — Other Ambulatory Visit: Payer: Self-pay | Admitting: Family

## 2019-09-14 DIAGNOSIS — Z79899 Other long term (current) drug therapy: Secondary | ICD-10-CM

## 2019-09-14 DIAGNOSIS — F1320 Sedative, hypnotic or anxiolytic dependence, uncomplicated: Secondary | ICD-10-CM

## 2019-09-14 DIAGNOSIS — F411 Generalized anxiety disorder: Secondary | ICD-10-CM

## 2019-09-15 ENCOUNTER — Telehealth (INDEPENDENT_AMBULATORY_CARE_PROVIDER_SITE_OTHER): Payer: Self-pay | Admitting: *Deleted

## 2019-09-15 ENCOUNTER — Other Ambulatory Visit (INDEPENDENT_AMBULATORY_CARE_PROVIDER_SITE_OTHER): Payer: Self-pay | Admitting: *Deleted

## 2019-09-15 ENCOUNTER — Other Ambulatory Visit: Payer: Self-pay

## 2019-09-15 ENCOUNTER — Other Ambulatory Visit: Payer: Medicare Other

## 2019-09-15 ENCOUNTER — Encounter (INDEPENDENT_AMBULATORY_CARE_PROVIDER_SITE_OTHER): Payer: Self-pay | Admitting: *Deleted

## 2019-09-15 DIAGNOSIS — Z85038 Personal history of other malignant neoplasm of large intestine: Secondary | ICD-10-CM

## 2019-09-15 DIAGNOSIS — Z944 Liver transplant status: Secondary | ICD-10-CM | POA: Diagnosis not present

## 2019-09-15 DIAGNOSIS — Z298 Encounter for other specified prophylactic measures: Secondary | ICD-10-CM | POA: Diagnosis not present

## 2019-09-15 DIAGNOSIS — D899 Disorder involving the immune mechanism, unspecified: Secondary | ICD-10-CM | POA: Diagnosis not present

## 2019-09-15 MED ORDER — SUPREP BOWEL PREP KIT 17.5-3.13-1.6 GM/177ML PO SOLN: 1.0000 | Freq: Once | ORAL | 0 refills | Status: AC

## 2019-09-15 NOTE — Telephone Encounter (Signed)
Patient needs suprep Flex Sig/EGD sch'd 12/23

## 2019-09-17 ENCOUNTER — Other Ambulatory Visit: Payer: Self-pay | Admitting: Family

## 2019-09-17 DIAGNOSIS — T8649 Other complications of liver transplant: Secondary | ICD-10-CM | POA: Diagnosis not present

## 2019-09-17 DIAGNOSIS — K831 Obstruction of bile duct: Secondary | ICD-10-CM | POA: Diagnosis not present

## 2019-09-17 DIAGNOSIS — I10 Essential (primary) hypertension: Secondary | ICD-10-CM | POA: Diagnosis not present

## 2019-09-17 DIAGNOSIS — D849 Immunodeficiency, unspecified: Secondary | ICD-10-CM | POA: Diagnosis not present

## 2019-09-17 DIAGNOSIS — R197 Diarrhea, unspecified: Secondary | ICD-10-CM | POA: Diagnosis not present

## 2019-09-17 DIAGNOSIS — Z944 Liver transplant status: Secondary | ICD-10-CM | POA: Diagnosis not present

## 2019-09-17 DIAGNOSIS — Z79899 Other long term (current) drug therapy: Secondary | ICD-10-CM | POA: Diagnosis not present

## 2019-09-17 DIAGNOSIS — Z9049 Acquired absence of other specified parts of digestive tract: Secondary | ICD-10-CM | POA: Diagnosis not present

## 2019-09-17 DIAGNOSIS — Z85038 Personal history of other malignant neoplasm of large intestine: Secondary | ICD-10-CM | POA: Diagnosis not present

## 2019-09-17 DIAGNOSIS — Z1509 Genetic susceptibility to other malignant neoplasm: Secondary | ICD-10-CM | POA: Diagnosis not present

## 2019-09-17 DIAGNOSIS — E039 Hypothyroidism, unspecified: Secondary | ICD-10-CM | POA: Diagnosis not present

## 2019-09-17 MED ORDER — CEPHALEXIN 500 MG PO CAPS: 500.0000 mg | ORAL_CAPSULE | Freq: Two times a day (BID) | ORAL | 0 refills | Status: DC

## 2019-09-18 DIAGNOSIS — Z4789 Encounter for other orthopedic aftercare: Secondary | ICD-10-CM | POA: Diagnosis not present

## 2019-09-18 DIAGNOSIS — S52251D Displaced comminuted fracture of shaft of ulna, right arm, subsequent encounter for closed fracture with routine healing: Secondary | ICD-10-CM | POA: Diagnosis not present

## 2019-09-22 DIAGNOSIS — E039 Hypothyroidism, unspecified: Secondary | ICD-10-CM | POA: Diagnosis not present

## 2019-09-22 DIAGNOSIS — M8580 Other specified disorders of bone density and structure, unspecified site: Secondary | ICD-10-CM | POA: Diagnosis not present

## 2019-09-22 DIAGNOSIS — Z20828 Contact with and (suspected) exposure to other viral communicable diseases: Secondary | ICD-10-CM | POA: Diagnosis not present

## 2019-09-22 DIAGNOSIS — K31819 Angiodysplasia of stomach and duodenum without bleeding: Secondary | ICD-10-CM | POA: Diagnosis not present

## 2019-09-22 DIAGNOSIS — Z79899 Other long term (current) drug therapy: Secondary | ICD-10-CM | POA: Diagnosis not present

## 2019-09-22 DIAGNOSIS — Z7982 Long term (current) use of aspirin: Secondary | ICD-10-CM | POA: Diagnosis not present

## 2019-09-22 DIAGNOSIS — K9189 Other postprocedural complications and disorders of digestive system: Secondary | ICD-10-CM | POA: Diagnosis not present

## 2019-09-22 DIAGNOSIS — K831 Obstruction of bile duct: Secondary | ICD-10-CM | POA: Diagnosis not present

## 2019-09-22 DIAGNOSIS — R569 Unspecified convulsions: Secondary | ICD-10-CM | POA: Diagnosis not present

## 2019-09-22 DIAGNOSIS — Z01812 Encounter for preprocedural laboratory examination: Secondary | ICD-10-CM | POA: Diagnosis not present

## 2019-09-22 DIAGNOSIS — T8649 Other complications of liver transplant: Secondary | ICD-10-CM | POA: Diagnosis not present

## 2019-09-22 DIAGNOSIS — Z944 Liver transplant status: Secondary | ICD-10-CM | POA: Diagnosis not present

## 2019-09-22 DIAGNOSIS — I10 Essential (primary) hypertension: Secondary | ICD-10-CM | POA: Diagnosis not present

## 2019-09-22 DIAGNOSIS — R748 Abnormal levels of other serum enzymes: Secondary | ICD-10-CM | POA: Diagnosis not present

## 2019-09-26 ENCOUNTER — Other Ambulatory Visit: Payer: Self-pay

## 2019-09-26 ENCOUNTER — Emergency Department (HOSPITAL_COMMUNITY): Payer: Medicare Other

## 2019-09-26 ENCOUNTER — Observation Stay (HOSPITAL_COMMUNITY)
Admission: EM | Admit: 2019-09-26 | Discharge: 2019-09-27 | Disposition: A | Discharge status: Home / Self Care | Payer: Medicare Other | Attending: Internal Medicine | Admitting: Internal Medicine

## 2019-09-26 ENCOUNTER — Observation Stay (HOSPITAL_COMMUNITY): Payer: Medicare Other

## 2019-09-26 ENCOUNTER — Encounter (HOSPITAL_COMMUNITY): Payer: Self-pay

## 2019-09-26 DIAGNOSIS — Z9049 Acquired absence of other specified parts of digestive tract: Secondary | ICD-10-CM | POA: Diagnosis not present

## 2019-09-26 DIAGNOSIS — Z79899 Other long term (current) drug therapy: Secondary | ICD-10-CM | POA: Diagnosis not present

## 2019-09-26 DIAGNOSIS — Z7983 Long term (current) use of bisphosphonates: Secondary | ICD-10-CM | POA: Diagnosis not present

## 2019-09-26 DIAGNOSIS — E559 Vitamin D deficiency, unspecified: Secondary | ICD-10-CM | POA: Diagnosis not present

## 2019-09-26 DIAGNOSIS — Z1509 Genetic susceptibility to other malignant neoplasm: Secondary | ICD-10-CM | POA: Diagnosis not present

## 2019-09-26 DIAGNOSIS — Z944 Liver transplant status: Secondary | ICD-10-CM

## 2019-09-26 DIAGNOSIS — S43005A Unspecified dislocation of left shoulder joint, initial encounter: Secondary | ICD-10-CM | POA: Diagnosis not present

## 2019-09-26 DIAGNOSIS — Z8505 Personal history of malignant neoplasm of liver: Secondary | ICD-10-CM | POA: Diagnosis not present

## 2019-09-26 DIAGNOSIS — Z20828 Contact with and (suspected) exposure to other viral communicable diseases: Secondary | ICD-10-CM | POA: Insufficient documentation

## 2019-09-26 DIAGNOSIS — R0902 Hypoxemia: Secondary | ICD-10-CM | POA: Diagnosis not present

## 2019-09-26 DIAGNOSIS — R52 Pain, unspecified: Secondary | ICD-10-CM | POA: Diagnosis not present

## 2019-09-26 DIAGNOSIS — S43015A Anterior dislocation of left humerus, initial encounter: Secondary | ICD-10-CM | POA: Diagnosis not present

## 2019-09-26 DIAGNOSIS — T84028A Dislocation of other internal joint prosthesis, initial encounter: Secondary | ICD-10-CM | POA: Diagnosis not present

## 2019-09-26 DIAGNOSIS — F411 Generalized anxiety disorder: Secondary | ICD-10-CM | POA: Diagnosis present

## 2019-09-26 DIAGNOSIS — Z79890 Hormone replacement therapy (postmenopausal): Secondary | ICD-10-CM | POA: Insufficient documentation

## 2019-09-26 DIAGNOSIS — Y831 Surgical operation with implant of artificial internal device as the cause of abnormal reaction of the patient, or of later complication, without mention of misadventure at the time of the procedure: Secondary | ICD-10-CM | POA: Diagnosis not present

## 2019-09-26 DIAGNOSIS — Z03818 Encounter for observation for suspected exposure to other biological agents ruled out: Secondary | ICD-10-CM | POA: Diagnosis not present

## 2019-09-26 DIAGNOSIS — Z96612 Presence of left artificial shoulder joint: Secondary | ICD-10-CM

## 2019-09-26 DIAGNOSIS — I10 Essential (primary) hypertension: Secondary | ICD-10-CM | POA: Diagnosis not present

## 2019-09-26 DIAGNOSIS — D649 Anemia, unspecified: Secondary | ICD-10-CM | POA: Insufficient documentation

## 2019-09-26 DIAGNOSIS — Z7982 Long term (current) use of aspirin: Secondary | ICD-10-CM | POA: Insufficient documentation

## 2019-09-26 DIAGNOSIS — M25519 Pain in unspecified shoulder: Secondary | ICD-10-CM | POA: Diagnosis not present

## 2019-09-26 DIAGNOSIS — K219 Gastro-esophageal reflux disease without esophagitis: Secondary | ICD-10-CM | POA: Diagnosis not present

## 2019-09-26 DIAGNOSIS — S43006A Unspecified dislocation of unspecified shoulder joint, initial encounter: Secondary | ICD-10-CM

## 2019-09-26 DIAGNOSIS — Z471 Aftercare following joint replacement surgery: Secondary | ICD-10-CM | POA: Diagnosis not present

## 2019-09-26 DIAGNOSIS — E039 Hypothyroidism, unspecified: Secondary | ICD-10-CM | POA: Diagnosis present

## 2019-09-26 DIAGNOSIS — Z85038 Personal history of other malignant neoplasm of large intestine: Secondary | ICD-10-CM | POA: Diagnosis not present

## 2019-09-26 DIAGNOSIS — Z96619 Presence of unspecified artificial shoulder joint: Secondary | ICD-10-CM

## 2019-09-26 LAB — COMPREHENSIVE METABOLIC PANEL
ALT: 17 U/L (ref 0–44)
AST: 23 U/L (ref 15–41)
Albumin: 3.2 g/dL — ABNORMAL LOW (ref 3.5–5.0)
Alkaline Phosphatase: 147 U/L — ABNORMAL HIGH (ref 38–126)
Anion gap: 6 (ref 5–15)
BUN: 12 mg/dL (ref 8–23)
CO2: 24 mmol/L (ref 22–32)
Calcium: 8 mg/dL — ABNORMAL LOW (ref 8.9–10.3)
Chloride: 106 mmol/L (ref 98–111)
Creatinine, Ser: 0.63 mg/dL (ref 0.44–1.00)
GFR calc Af Amer: >60 mL/min (ref >60)
GFR calc non Af Amer: >60 mL/min (ref >60)
Glucose, Bld: 106 mg/dL — ABNORMAL HIGH (ref 70–99)
Potassium: 3.8 mmol/L (ref 3.5–5.1)
Sodium: 136 mmol/L (ref 135–145)
Total Bilirubin: 0.3 mg/dL (ref 0.3–1.2)
Total Protein: 7.1 g/dL (ref 6.5–8.1)

## 2019-09-26 LAB — CBC WITH DIFFERENTIAL/PLATELET
Abs Immature Granulocytes: 0.02 10*3/uL (ref 0.00–0.07)
Basophils Absolute: 0.2 10*3/uL — ABNORMAL HIGH (ref 0.0–0.1)
Basophils Relative: 2 %
Eosinophils Absolute: 0.5 10*3/uL (ref 0.0–0.5)
Eosinophils Relative: 6 %
HCT: 31.3 % — ABNORMAL LOW (ref 36.0–46.0)
Hemoglobin: 9.1 g/dL — ABNORMAL LOW (ref 12.0–15.0)
Immature Granulocytes: 0 %
Lymphocytes Relative: 24 %
Lymphs Abs: 2 10*3/uL (ref 0.7–4.0)
MCH: 25.2 pg — ABNORMAL LOW (ref 26.0–34.0)
MCHC: 29.1 g/dL — ABNORMAL LOW (ref 30.0–36.0)
MCV: 86.7 fL (ref 80.0–100.0)
Monocytes Absolute: 1.2 10*3/uL — ABNORMAL HIGH (ref 0.1–1.0)
Monocytes Relative: 14 %
Neutro Abs: 4.4 10*3/uL (ref 1.7–7.7)
Neutrophils Relative %: 54 %
Platelets: 465 10*3/uL — ABNORMAL HIGH (ref 150–400)
RBC: 3.61 MIL/uL — ABNORMAL LOW (ref 3.87–5.11)
RDW: 14.7 % (ref 11.5–15.5)
WBC: 8.2 10*3/uL (ref 4.0–10.5)
nRBC: 0 % (ref 0.0–0.2)

## 2019-09-26 MED ORDER — PROPOFOL 10 MG/ML IV BOLUS
0.5000 mg/kg | Freq: Once | INTRAVENOUS | Status: AC
  Administered 2019-09-26: 16:00:00 32.2 mg via INTRAVENOUS
  Filled 2019-09-26: qty 20

## 2019-09-26 MED ORDER — LORATADINE 10 MG PO TABS
10.0000 mg | ORAL_TABLET | Freq: Every day | ORAL | Status: DC
  Administered 2019-09-26 – 2019-09-27 (×2): 10 mg via ORAL
  Filled 2019-09-26 (×2): qty 1

## 2019-09-26 MED ORDER — PROPOFOL 1000 MG/100ML IV EMUL
INTRAVENOUS | Status: AC
  Filled 2019-09-26: qty 100

## 2019-09-26 MED ORDER — HYDROMORPHONE HCL 1 MG/ML IJ SOLN
INTRAMUSCULAR | Status: AC | PRN
  Administered 2019-09-26: 1 mg via INTRAVENOUS

## 2019-09-26 MED ORDER — PROPOFOL 10 MG/ML IV BOLUS
INTRAVENOUS | Status: AC | PRN
  Administered 2019-09-26 (×4): 15 mg via INTRAVENOUS

## 2019-09-26 MED ORDER — HYDROMORPHONE HCL 1 MG/ML IJ SOLN
1.0000 mg | INTRAMUSCULAR | Status: DC | PRN
  Administered 2019-09-26 – 2019-09-27 (×6): 1 mg via INTRAVENOUS
  Filled 2019-09-26 (×6): qty 1

## 2019-09-26 MED ORDER — FENTANYL CITRATE (PF) 100 MCG/2ML IJ SOLN
50.0000 ug | Freq: Once | INTRAMUSCULAR | Status: AC
  Administered 2019-09-26: 50 ug via INTRAVENOUS
  Filled 2019-09-26: qty 2

## 2019-09-26 MED ORDER — HYDROMORPHONE HCL 1 MG/ML IJ SOLN
INTRAMUSCULAR | Status: AC
  Filled 2019-09-26: qty 1

## 2019-09-26 NOTE — ED Triage Notes (Signed)
Pt was pruning bushes with sawzall and it kicked back into left shoulder. Noted to have deformity. Adm 125 mcg of fentanyl en route

## 2019-09-26 NOTE — ED Provider Notes (Signed)
[facility] EMERGENCY DEPARTMENT Provider Note   CSN: [id number] Arrival date & time: 09/26/19  1511     History   Chief Complaint Chief Complaint  Patient presents with  . Shoulder Injury    HPI Cassidy Barber is a 68 y.o. female.     The history is provided by the patient. No language interpreter was used.  Shoulder Injury This is a new problem. The current episode started less than 1 hour ago. The problem occurs constantly. The problem has been gradually worsening. Pertinent negatives include no chest pain. Nothing aggravates the symptoms. Nothing relieves the symptoms. The treatment provided no relief.   Pt reports she had a left shoulder replacement.  Pt was pruning and blade kicked back.  Pt complains of pain and disloctaion of left shulder Past Medical History:  Diagnosis Date  . Anemia of chronic disease 10/26/2013  . Hernia of unspecified site of abdominal cavity without mention of obstruction or gangrene   . Hypertension   . Liver disease   . Lynch syndrome   . Lynch syndrome   . Primary biliary cirrhosis ([place]) 10/26/2013  . Thyroid disease     Patient Active Problem List   Diagnosis Date Noted  . Hx of colonic polyps 09/04/2019  . Chest pain 09/04/2019  . Benzodiazepine dependence ([place]) 04/13/2019  . Controlled substance agreement signed 04/13/2019  . Urticaria 09/08/2018  . S/p reverse total shoulder arthroplasty 07/17/2018  . History of colon cancer 06/12/2018  . History of colonic polyps 06/12/2018  . 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  . Gastroesophageal reflux disease 06/12/2018  . Chronic diarrhea 06/26/2017  . Iron deficiency anemia 06/26/2017  . GAD (generalized anxiety disorder) 08/28/2016  . Vitamin D deficiency 08/28/2016  . Insomnia 08/28/2016  . Convulsions ([place]) 11/30/2015  . Liver transplant recipient [facility]) 11/30/2015  . Seizures ([place]) 09/27/2015  . Essential hypertension, benign 03/10/2015  . Hx of liver transplant ([place]) 12/08/2014  .  Diarrhea 12/08/2014  . Colon cancer ([place]) 12/08/2014  . Hepatic encephalopathy ([place]) 10/26/2013  . Anemia of chronic disease 10/26/2013  . Personal history of colonic polyps 12/27/2012  . PBC (primary biliary cirrhosis) 03/24/2012  . Hypothyroidism 03/24/2012  . Ventral hernia, recurrent 03/24/2012    Past Surgical History:  Procedure Laterality Date  . ABDOMINAL HERNIA REPAIR     Patient's states that she has had 8- 9 hernia surgeries  . ABDOMINAL HYSTERECTOMY    . CHOLECYSTECTOMY  2007  . COLON RESECTION    . COLON SURGERY  2008   Done at [facility]  . COLONOSCOPY     Done at [facility]  . ESOPHAGOGASTRODUODENOSCOPY N/A 08/21/2018   Procedure: ESOPHAGOGASTRODUODENOSCOPY (EGD);  Surgeon: Rogene Houston, MD;  Location[facility];  Service: Endoscopy;  Laterality: N/A;  . EYE SURGERY     lasix  . FLEXIBLE SIGMOIDOSCOPY N/A 10/20/2015   Procedure: FLEXIBLE SIGMOIDOSCOPY;  Surgeon: Rogene Houston, MD;  Location: [facility];  Service: Endoscopy;  Laterality: N/A;  26 - Dr Laural Golden has meeting until 1:00  . FLEXIBLE SIGMOIDOSCOPY N/A 07/11/2016   Procedure: FLEXIBLE SIGMOIDOSCOPY;  Surgeon: Rogene Houston, MD;  Location: [facility];  Service: Endoscopy;  Laterality: N/A;  1200  . FLEXIBLE SIGMOIDOSCOPY N/A 08/09/2017   Procedure: FLEXIBLE SIGMOIDOSCOPY;  Surgeon: Rogene Houston, MD;  [facility];  Service: Endoscopy;  Laterality: N/A;  1:00  . FLEXIBLE SIGMOIDOSCOPY N/A 08/21/2018   Procedure: FLEXIBLE SIGMOIDOSCOPY;  Surgeon: Rogene Houston, MD;  Location: [facility];  Service: Endoscopy;  Laterality: N/A;  . HERNIA REPAIR    . LIVER TRANSPLANT  [id number]  . multiple skin cancers removed    . POLYPECTOMY  08/09/2017   Procedure: POLYPECTOMY;  Surgeon: Rogene Houston, MD;  Location[facility];  Service: Endoscopy;;  colon small bowel  . REVERSE SHOULDER ARTHROPLASTY Left 07/17/2018  . REVERSE SHOULDER ARTHROPLASTY Left 07/17/2018   Procedure: LEFT REVERSE SHOULDER  ARTHROPLASTY;  Surgeon: Justice Britain, MD;  Location: [place];  Service: Orthopedics;  Laterality: Left;  180min  . SPLENECTOMY  2006  . TYMPANOSTOMY TUBE PLACEMENT    . UPPER GASTROINTESTINAL ENDOSCOPY     Done at [facility]     OB History   No obstetric history on file.      Home Medications    Prior to Admission medications   Medication Sig Start Date End Date Taking? Authorizing Provider  alendronate (FOSAMAX) 70 MG tablet TAKE 1 TABLET BY MOUTH ONCE A WEEK WITH A FULL GLASS OF WATER ON AN EMPTY STOMACH 08/03/19   [name] A, FNP  ALPRAZolam Duanne Moron) 0.5 MG tablet Take 1 tablet (0.5 mg total) by mouth at bedtime as needed. 04/13/19   [name] A, FNP  aspirin EC 81 MG tablet Take 81 mg by mouth daily.    [provider]  Biotin 10000 MCG TABS Take 10,000 mcg by mouth daily.    [provider]  CALCIUM CITRATE PO Take 1,200 mg by mouth 2 (two) times daily.     [provider]  cephALEXin (KEFLEX) 500 MG capsule Take 1 capsule (500 mg total) by mouth 2 (two) times daily. 09/17/19   [name], FNP  cetirizine (ZYRTEC) 10 MG tablet Take 1-2 tablets 2 times a day max 40mg  04/13/19   Hawks, Alyse Low A, FNP  Cholecalciferol (VITAMIN D3) 75 MCG (3000 UT) TABS Take by mouth.    [provider]  cyclobenzaprine (FLEXERIL) 5 MG tablet Take 1 tablet (5 mg total) by mouth 3 (three) times daily as needed for muscle spasms. Patient taking differently: Take 5 mg by mouth 2 (two) times daily as needed for muscle spasms.  09/12/17   [name] A, FNP  EPINEPHrine 0.3 mg/0.3 mL IJ SOAJ injection Use as directed for severe allergic reaction 10/21/18   Kozlow, Donnamarie Poag, MD  famotidine (PEPCID) 20 MG tablet Take 1 tablet (20 mg total) by mouth daily. Take 1 tablet twice a day Patient taking differently: Take 20 mg by mouth at bedtime. Take 1 tablet twice a day 04/13/19   [name] A, FNP  fluorouracil (EFUDEX) 5 % cream Apply 1 application topically daily as  needed (cancer spots).  06/11/18   [provider]  HYDROcodone-acetaminophen (NORCO/VICODIN) 5-325 MG tablet Take 1 tablet by mouth 3 (three) times daily as needed for moderate pain.    [provider]  HYDROcodone-acetaminophen (NORCO/VICODIN) 5-325 MG tablet Take 1 tablet by mouth every 6 (six) hours as needed. 07/01/19   Fredia Sorrow, MD  hydrocortisone cream 1 % Apply 1 application topically daily as needed for itching.    [provider]  hydrOXYzine (ATARAX/VISTARIL) 25 MG tablet TAKE 1 TABLET THREE TIMES DAILY AS NEEDED FOR ITCHING 09/08/18   Terald Sleeper, PA-C  levothyroxine (SYNTHROID, LEVOTHROID) 125 MCG tablet Take 1 tablet (125 mcg total) by mouth daily. 11/26/18   [name], FNP  losartan (COZAAR) 25 MG tablet Take 1 tablet (25 mg total) by mouth daily. 04/13/19  Hawks, Christy A, FNP  magnesium oxide (MAG-OX) 400 MG tablet Take 400 mg by mouth daily.    [provider]  metoprolol succinate (TOPROL XL) 25 MG 24 hr tablet Take 1 tablet (25 mg total) by mouth daily. 02/02/19 02/02/20  [name] A, FNP  montelukast (SINGULAIR) 10 MG tablet Take 1 tablet (10 mg total) by mouth at bedtime. 10/21/18   Kozlow, Donnamarie Poag, MD  ondansetron (ZOFRAN ODT) 4 MG disintegrating tablet Take 1 tablet (4 mg total) by mouth every 8 (eight) hours as needed. 07/01/19   Fredia Sorrow, MD  pantoprazole (PROTONIX) 40 MG tablet Take 1 tablet (40 mg total) by mouth 2 (two) times daily. 08/28/19   [name], FNP  Polypodium Leucotomos (HELIOCARE) 240 MG CAPS Take by mouth daily. Patient states that she takes 2 daily.    [provider]  Probiotic Product (PROBIOTIC-10 PO) Take by mouth daily.    [provider]  ursodiol (ACTIGALL) 250 MG tablet Take 300 mg by mouth 2 (two) times daily.     [provider]  valACYclovir (VALTREX) 500 MG tablet TAKE ONE (1) TABLET THREE (3) TIMES EACH DAY 06/25/19   Terald Sleeper, PA-C  vitamin B-12  (CYANOCOBALAMIN) 1000 MCG tablet Take 1,000 mcg by mouth daily.    [provider]  Vitamin D, Ergocalciferol, (DRISDOL) 1.25 MG (50000 UT) CAPS capsule Take 1 capsule (50,000 Units total) by mouth once a week. 06/02/19   [name], FNP  vitamin E (VITAMIN E) 400 UNIT capsule Take 400 Units by mouth daily.    [provider]  ZORTRESS 0.5 MG TABS TAKE 5 TABLETS BY MOUTH EVERY 12 HOURS 12/12/18   [provider]    Family History Family History  Problem Relation Age of Onset  . Prostate cancer Father   . Colon cancer Father   . Colon cancer Sister   . Lung cancer Sister   . Healthy Son   . Alcohol abuse Brother   . Allergic rhinitis Neg Hx   . Asthma Neg Hx   . Eczema Neg Hx   . Urticaria Neg Hx     Social History Social History   Tobacco Use  . Smoking status: Never Smoker  . Smokeless tobacco: Never Used  Substance Use Topics  . Alcohol use: No    Alcohol/week: 0.0 standard drinks  . Drug use: No     Allergies   Ciprofloxacin and Codeine   Review of Systems Review of Systems  Cardiovascular: Negative for chest pain.  Musculoskeletal: Positive for arthralgias and joint swelling.  All other systems reviewed and are negative.    Physical Exam Updated Vital Signs BP 140/72 (BP Location: Right Arm)   Pulse 71   Temp 98.2 F (36.8 C) (Oral)   Resp 16   Ht 5\' 4"  (1.626 m)   Wt 64.4 kg   SpO2 96%   BMI 24.37 kg/m   Physical Exam Vitals signs and nursing note reviewed.  Constitutional:      Appearance: She is well-developed.  HENT:     Head: Normocephalic.  Neck:     Musculoskeletal: Normal range of motion.  Cardiovascular:     Rate and Rhythm: Normal rate.  Pulmonary:     Effort: Pulmonary effort is normal.  Abdominal:     General: There is no distension.  Musculoskeletal:        General: Swelling and tenderness present.     Comments: Obvious dislocation left shoulder  nv and ns intact  Skin:    General: Skin is  warm.  Neurological:     General: No focal deficit present.     Mental Status: She is alert and oriented to person, place, and time.  Psychiatric:        Mood and Affect: Mood normal.      ED Treatments / Results  Labs (all labs ordered are listed, but only abnormal results are displayed) Labs Reviewed - No data to display  EKG None  Radiology No results found.  Procedures Procedures (including critical care time)  Medications Ordered in ED Medications  fentaNYL (SUBLIMAZE) injection 50 mcg (has no administration in time range)     Initial Impression / Assessment and Plan / ED Course  I have reviewed the triage vital signs and the nursing notes.  Pertinent labs & imaging results that were available during my care of the patient were reviewed by me and considered in my medical decision making (see chart for details).          Final Clinical Impressions(s) / ED Diagnoses   Final diagnoses:  Dislocated shoulder, left, initial encounter    ED Discharge Orders    None       Sidney Ace 09/26/19 1650    Milton Ferguson, MD 09/26/19 2253

## 2019-09-26 NOTE — ED Provider Notes (Signed)
[facility] EMERGENCY DEPARTMENT Provider Note   CSN: [id number] Arrival date & time: 09/26/19  1511     History   Chief Complaint Chief Complaint  Patient presents with  . Shoulder Injury    HPI YARET RIOPEL is a 68 y.o. female.     Patient had a reverse shoulder surgery done a year ago.  She was working some machinery on some trees and fell and hurt her left shoulder.  The history is provided by the patient. No language interpreter was used.  Shoulder Injury This is a new problem. The current episode started 12 to 24 hours ago. The problem occurs constantly. The problem has not changed since onset.Pertinent negatives include no chest pain, no abdominal pain and no headaches. Nothing aggravates the symptoms. She has tried nothing for the symptoms. The treatment provided no relief.    Past Medical History:  Diagnosis Date  . Anemia of chronic disease 10/26/2013  . Hernia of unspecified site of abdominal cavity without mention of obstruction or gangrene   . Hypertension   . Liver disease   . Lynch syndrome   . Lynch syndrome   . Primary biliary cirrhosis ([place]) 10/26/2013  . Thyroid disease     Patient Active Problem List   Diagnosis Date Noted  . Hx of colonic polyps 09/04/2019  . Chest pain 09/04/2019  . Benzodiazepine dependence ([place]) 04/13/2019  . Controlled substance agreement signed 04/13/2019  . Urticaria 09/08/2018  . S/p reverse total shoulder arthroplasty 07/17/2018  . History of colon cancer 06/12/2018  . History of colonic polyps 06/12/2018  . 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  . Gastroesophageal reflux disease 06/12/2018  . Chronic diarrhea 06/26/2017  . Iron deficiency anemia 06/26/2017  . GAD (generalized anxiety disorder) 08/28/2016  . Vitamin D deficiency 08/28/2016  . Insomnia 08/28/2016  . Convulsions ([place]) 11/30/2015  . Liver transplant recipient [facility]) 11/30/2015  . Seizures ([place]) 09/27/2015  . Essential hypertension, benign 03/10/2015  . Hx of  liver transplant ([place]) 12/08/2014  . Diarrhea 12/08/2014  . Colon cancer ([place]) 12/08/2014  . Hepatic encephalopathy ([place]) 10/26/2013  . Anemia of chronic disease 10/26/2013  . Personal history of colonic polyps 12/27/2012  . PBC (primary biliary cirrhosis) 03/24/2012  . Hypothyroidism 03/24/2012  . Ventral hernia, recurrent 03/24/2012    Past Surgical History:  Procedure Laterality Date  . ABDOMINAL HERNIA REPAIR     Patient's states that she has had 8- 9 hernia surgeries  . ABDOMINAL HYSTERECTOMY    . CHOLECYSTECTOMY  2007  . COLON RESECTION    . COLON SURGERY  2008   [facility]  . COLONOSCOPY     Done at [facility]  . ESOPHAGOGASTRODUODENOSCOPY N/A 08/21/2018   Procedure: ESOPHAGOGASTRODUODENOSCOPY (EGD);  Surgeon: Rogene Houston, MD;  Location[facility];  Service: Endoscopy;  Laterality: N/A;  . EYE SURGERY     lasix  . FLEXIBLE SIGMOIDOSCOPY N/A 10/20/2015   Procedure: FLEXIBLE SIGMOIDOSCOPY;  Surgeon: Rogene Houston, MD;  Location: [facility] END[facility];  Service: Endoscopy;  Laterality: N/A;  37 - Dr Laural Golden has meeting until 1:00  . FLEXIBLE SIGMOIDOSCOPY N/A 07/11/2016   Procedure: FLEXIBLE SIGMOIDOSCOPY;  Surgeon: Rogene Houston, MD;  Location: [facility];  Service: Endoscopy;  Laterality: N/A;  1200  . FLEXIBLE SIGMOIDOSCOPY N/A 08/09/2017   Procedure: FLEXIBLE SIGMOIDOSCOPY;  Surgeon: Rogene Houston, MD;  Location: [facility];  Service: Endoscopy;  Laterality: N/A;  1:00  . FLEXIBLE SIGMOIDOSCOPY N/A 08/21/2018   Procedure:  FLEXIBLE SIGMOIDOSCOPY;  Surgeon: Rogene Houston, MD;  Location: [facility];  Service: Endoscopy;  Laterality: N/A;  . HERNIA REPAIR    . LIVER TRANSPLANT  [id number]  . multiple skin cancers removed    . POLYPECTOMY  08/09/2017   Procedure: POLYPECTOMY;  Surgeon: Rogene Houston, MD;  Location: [facility];  Service: Endoscopy;;  colon small bowel  . REVERSE SHOULDER ARTHROPLASTY Left 07/17/2018  . REVERSE SHOULDER ARTHROPLASTY Left  07/17/2018   Procedure: LEFT REVERSE SHOULDER ARTHROPLASTY;  Surgeon: Justice Britain, MD;  Location: [place];  Service: Orthopedics;  Laterality: Left;  139min  . SPLENECTOMY  2006  . TYMPANOSTOMY TUBE PLACEMENT    . UPPER GASTROINTESTINAL ENDOSCOPY     Done at [facility]     OB History   No obstetric history on file.      Home Medications    Prior to Admission medications   Medication Sig Start Date End Date Taking? Authorizing Provider  alendronate (FOSAMAX) 70 MG tablet TAKE 1 TABLET BY MOUTH ONCE A WEEK WITH A FULL GLASS OF WATER ON AN EMPTY STOMACH 08/03/19   [name] A, FNP  ALPRAZolam Duanne Moron) 0.5 MG tablet Take 1 tablet (0.5 mg total) by mouth at bedtime as needed. 04/13/19   [name] A, FNP  aspirin EC 81 MG tablet Take 81 mg by mouth daily.    [provider]  Biotin 10000 MCG TABS Take 10,000 mcg by mouth daily.    [provider]  CALCIUM CITRATE PO Take 1,200 mg by mouth 2 (two) times daily.     [provider]  cephALEXin (KEFLEX) 500 MG capsule Take 1 capsule (500 mg total) by mouth 2 (two) times daily. 09/17/19   [name], FNP  cetirizine (ZYRTEC) 10 MG tablet Take 1-2 tablets 2 times a day max 40mg  04/13/19   [name] A, FNP  Cholecalciferol (VITAMIN D3) 75 MCG (3000 UT) TABS Take by mouth.    [provider]  cyclobenzaprine (FLEXERIL) 5 MG tablet Take 1 tablet (5 mg total) by mouth 3 (three) times daily as needed for muscle spasms. Patient taking differently: Take 5 mg by mouth 2 (two) times daily as needed for muscle spasms.  09/12/17   [name] A, FNP  EPINEPHrine 0.3 mg/0.3 mL IJ SOAJ injection Use as directed for severe allergic reaction 10/21/18   Kozlow, Donnamarie Poag, MD  famotidine (PEPCID) 20 MG tablet Take 1 tablet (20 mg total) by mouth daily. Take 1 tablet twice a day Patient taking differently: Take 20 mg by mouth at bedtime. Take 1 tablet twice a day 04/13/19   [name] A, FNP  fluorouracil (EFUDEX) 5 % cream  Apply 1 application topically daily as needed (cancer spots).  06/11/18   [provider]  HYDROcodone-acetaminophen (NORCO/VICODIN) 5-325 MG tablet Take 1 tablet by mouth 3 (three) times daily as needed for moderate pain.    [provider]  HYDROcodone-acetaminophen (NORCO/VICODIN) 5-325 MG tablet Take 1 tablet by mouth every 6 (six) hours as needed. 07/01/19   Fredia Sorrow, MD  hydrocortisone cream 1 % Apply 1 application topically daily as needed for itching.    [provider]  hydrOXYzine (ATARAX/VISTARIL) 25 MG tablet TAKE 1 TABLET THREE TIMES DAILY AS NEEDED FOR ITCHING 09/08/18   Terald Sleeper, PA-C  levothyroxine (SYNTHROID, LEVOTHROID) 125 MCG tablet Take 1 tablet (125 mcg total) by mouth daily. 11/26/18   [name], FNP  losartan (COZAAR) 25 MG  tablet Take 1 tablet (25 mg total) by mouth daily. 04/13/19   [name] A, FNP  magnesium oxide (MAG-OX) 400 MG tablet Take 400 mg by mouth daily.    [provider]  metoprolol succinate (TOPROL XL) 25 MG 24 hr tablet Take 1 tablet (25 mg total) by mouth daily. 02/02/19 02/02/20  [name] A, FNP  montelukast (SINGULAIR) 10 MG tablet Take 1 tablet (10 mg total) by mouth at bedtime. 10/21/18   Kozlow, Donnamarie Poag, MD  ondansetron (ZOFRAN ODT) 4 MG disintegrating tablet Take 1 tablet (4 mg total) by mouth every 8 (eight) hours as needed. 07/01/19   Fredia Sorrow, MD  pantoprazole (PROTONIX) 40 MG tablet Take 1 tablet (40 mg total) by mouth 2 (two) times daily. 08/28/19   [name], FNP  Polypodium Leucotomos (HELIOCARE) 240 MG CAPS Take by mouth daily. Patient states that she takes 2 daily.    [provider]  Probiotic Product (PROBIOTIC-10 PO) Take by mouth daily.    [provider]  ursodiol (ACTIGALL) 250 MG tablet Take 300 mg by mouth 2 (two) times daily.     [provider]  valACYclovir (VALTREX) 500 MG tablet TAKE ONE (1) TABLET THREE (3) TIMES EACH DAY 06/25/19    Terald Sleeper, PA-C  vitamin B-12 (CYANOCOBALAMIN) 1000 MCG tablet Take 1,000 mcg by mouth daily.    [provider]  Vitamin D, Ergocalciferol, (DRISDOL) 1.25 MG (50000 UT) CAPS capsule Take 1 capsule (50,000 Units total) by mouth once a week. 06/02/19   [name], FNP  vitamin E (VITAMIN E) 400 UNIT capsule Take 400 Units by mouth daily.    [provider]  ZORTRESS 0.5 MG TABS TAKE 5 TABLETS BY MOUTH EVERY 12 HOURS 12/12/18   [provider]    Family History Family History  Problem Relation Age of Onset  . Prostate cancer Father   . Colon cancer Father   . Colon cancer Sister   . Lung cancer Sister   . Healthy Son   . Alcohol abuse Brother   . Allergic rhinitis Neg Hx   . Asthma Neg Hx   . Eczema Neg Hx   . Urticaria Neg Hx     Social History Social History   Tobacco Use  . Smoking status: Never Smoker  . Smokeless tobacco: Never Used  Substance Use Topics  . Alcohol use: No    Alcohol/week: 0.0 standard drinks  . Drug use: No     Allergies   Ciprofloxacin and Codeine   Review of Systems Review of Systems  Constitutional: Negative for appetite change and fatigue.  HENT: Negative for congestion, ear discharge and sinus pressure.   Eyes: Negative for discharge.  Respiratory: Negative for cough.   Cardiovascular: Negative for chest pain.  Gastrointestinal: Negative for abdominal pain and diarrhea.  Genitourinary: Negative for frequency and hematuria.  Musculoskeletal: Negative for back pain.       Left shoulder pain  Skin: Negative for rash.  Neurological: Negative for seizures and headaches.  Psychiatric/Behavioral: Negative for hallucinations.     Physical Exam Updated Vital Signs BP 128/77 (BP Location: Right Arm)   Pulse 75   Temp 98.2 F (36.8 C) (Oral)   Resp 14   Ht 5\' 4"  (1.626 m)   Wt 64.4 kg   SpO2 97%   BMI 24.37 kg/m   Physical Exam Vitals signs and nursing note reviewed.  Constitutional:       Appearance: She is  well-developed.  HENT:     Head: Normocephalic.     Nose: Nose normal.  Eyes:     General: No scleral icterus.    Conjunctiva/sclera: Conjunctivae normal.  Neck:     Musculoskeletal: Neck supple.     Thyroid: No thyromegaly.  Cardiovascular:     Rate and Rhythm: Normal rate and regular rhythm.     Heart sounds: No murmur. No friction rub. No gallop.   Pulmonary:     Breath sounds: No stridor. No wheezing or rales.  Chest:     Chest wall: No tenderness.  Abdominal:     General: There is no distension.     Tenderness: There is no abdominal tenderness. There is no rebound.  Musculoskeletal:     Comments: Deformed left shoulder  Lymphadenopathy:     Cervical: No cervical adenopathy.  Skin:    Findings: No erythema or rash.  Neurological:     Mental Status: She is oriented to person, place, and time.     Motor: No abnormal muscle tone.     Coordination: Coordination normal.  Psychiatric:        Behavior: Behavior normal.      ED Treatments / Results  Labs (all labs ordered are listed, but only abnormal results are displayed) Labs Reviewed  SARS CORONAVIRUS 2 (TAT 6-24 HRS)  CBC WITH DIFFERENTIAL/PLATELET  COMPREHENSIVE METABOLIC PANEL    EKG None  Radiology Dg Shoulder Left  Result Date: 09/26/2019 CLINICAL DATA:  Shoulder deformity EXAM: LEFT SHOULDER - 2+ VIEW COMPARISON:  Chest x-ray 07/01/2019 FINDINGS: Status post reverse shoulder replacement. Humerus component is positioned cephalad to the humeral component. The [facility] joint appears intact. IMPRESSION: Status post reverse shoulder replacement with cephalad displacement of the humerus component with respect to the glenoid component. Electronically Signed   By: Donavan Foil M.D.   On: 09/26/2019 16:08    Procedures .Sedation  Date/Time: 09/26/2019 5:59 PM Performed by: Milton Ferguson, MD Authorized by: Milton Ferguson, MD   Consent:    Consent obtained:  Verbal   Consent given by:  Patient    Risks discussed:  Allergic reaction Universal protocol:    Procedure explained and questions answered to patient or proxy's satisfaction: yes     Relevant documents present and verified: yes     Test results available and properly labeled: yes     Required blood products, implants, devices, and special equipment available: yes     Site/side marked: yes     Immediately prior to procedure a time out was called: yes   Indications:    Sedation is required to allow for: Shoulder reduction. Pre-sedation assessment:    Time since last food or drink:  5 hours   ASA classification: class 1 - normal, healthy patient     Mallampati score:  I - soft palate, uvula, fauces, pillars visible   Pre-sedation assessments completed and reviewed: airway patency   Procedure details (see MAR for exact dosages):    Total Provider sedation time (minutes):  50 Comments:     Patient was sedated appropriately with propofol and Dilaudid and fentanyl   (including critical care time)  Medications Ordered in ED Medications  fentaNYL (SUBLIMAZE) injection 50 mcg (50 mcg Intravenous Given 09/26/19 1616)  propofol (DIPRIVAN) 10 mg/mL bolus/IV push 32.2 mg (32.2 mg Intravenous Given 09/26/19 1629)  propofol (DIPRIVAN) 10 mg/mL bolus/IV push (15 mg Intravenous Given 09/26/19 1642)  HYDROmorphone (DILAUDID) injection ( Intravenous Canceled Entry 09/26/19 1645)  Initial Impression / Assessment and Plan / ED Course  I have reviewed the triage vital signs and the nursing notes.  Pertinent labs & imaging results that were available during my care of the patient were reviewed by me and considered in my medical decision making (see chart for details). Patient had an attempt to reduce her dislocated left shoulder.  This was attempted by Marcene Brawn, PA and myself.  We were unable to reduce the shoulder.  She will be admitted      Patient was sedated with propofol and given Dilaudid and fentanyl for pain.  Marcene Brawn, PA and myself attempted to reduce the dislocated left shoulder.  We were unable to reduce it.  I contacted Dr. Lyla Glassing with [facility] and he stated to have the patient transferred to [facility] emergency department and he would take care of her in the emergency department...Marland KitchenMarland KitchenMarland Kitchen    Dr. Lyla Glassing call me back again and stated that the patient needs to be admitted to [facility] long hospital for observation and then will go to the operating department tomorrow to get her shoulder reduced Final Clinical Impressions(s) / ED Diagnoses   Final diagnoses:  Dislocated shoulder, left, initial encounter    ED Discharge Orders    None       Milton Ferguson, MD 09/26/19 1815

## 2019-09-26 NOTE — H&P (Signed)
History and Physical  Cassidy Barber [id number] DOB: [birth year] DOA: 09/26/2019  Referring physician: Dr Roderic Palau, ED physician PCP: [name], FNP  Outpatient Specialists:   Patient Coming From: home  Chief Complaint: left shoulder pain  HPI: Cassidy Barber is a 68 y.o. female with a history of primary biliary cirrhosis status post liver transplant 6 years ago, history of colon cancer secondary to Lynch syndrome status post colon resection, hypertension, GERD, hypothyroidism.  Patient has a history of a left shoulder replacement.  Patient seen for left shoulder pain that started approximately an hour prior to arrival.  She was pruning and the blade kicked back and she immediately had left shoulder pain.  Movement increases the pain.  She has received IV pain medicine in the ER which improved the pain.  No other palliating or provoking factors.  Emergency Department Course: X-ray shows reverse shoulder replacement with cephalad displacement of humerus component  Review of Systems:   Pt denies any fevers, chills, nausea, vomiting, diarrhea, constipation, abdominal pain, shortness of breath, dyspnea on exertion, orthopnea, cough, wheezing, palpitations, headache, vision changes, lightheadedness, dizziness, melena, rectal bleeding.  Review of systems are otherwise negative  Past Medical History:  Diagnosis Date   Anemia of chronic disease 10/26/2013   Hernia of unspecified site of abdominal cavity without mention of obstruction or gangrene    Hypertension    Liver disease    Lynch syndrome    Lynch syndrome    Primary biliary cirrhosis ([place]) 10/26/2013   Thyroid disease    Past Surgical History:  Procedure Laterality Date   ABDOMINAL HERNIA REPAIR     Patient's states that she has had 8- 9 hernia surgeries   ABDOMINAL HYSTERECTOMY     CHOLECYSTECTOMY  2007   COLON RESECTION     COLON SURGERY  2008   Done at [facility]   COLONOSCOPY     Done at [facility]    ESOPHAGOGASTRODUODENOSCOPY N/A 08/21/2018   Procedure: ESOPHAGOGASTRODUODENOSCOPY (EGD);  Surgeon: Rogene Houston, MD;  [facility];  Service: Endoscopy;  Laterality: N/A;   EYE SURGERY     lasix   FLEXIBLE SIGMOIDOSCOPY N/A 10/20/2015   Procedure: FLEXIBLE SIGMOIDOSCOPY;  Surgeon: Rogene Houston, MD;  Location[facility];  Service: Endoscopy;  Laterality: N/A;  28 - Dr Laural Golden has meeting until 1:00   FLEXIBLE SIGMOIDOSCOPY N/A 07/11/2016   Procedure: FLEXIBLE SIGMOIDOSCOPY;  Surgeon: Rogene Houston, MD;  [facility];  Service: Endoscopy;  Laterality: N/A;  [place] N/A 08/09/2017   Procedure: FLEXIBLE SIGMOIDOSCOPY;  Surgeon: Rogene Houston, MD;  [facility];  Service: Endoscopy;  Laterality: N/A;  1:00   FLEXIBLE SIGMOIDOSCOPY N/A 08/21/2018   Procedure: FLEXIBLE SIGMOIDOSCOPY;  Surgeon: Rogene Houston, MD;  [facility];  Service: Endoscopy;  Laterality: N/A;   HERNIA REPAIR     LIVER TRANSPLANT  [id number]   multiple skin cancers removed     POLYPECTOMY  08/09/2017   Procedure: POLYPECTOMY;  Surgeon: Rogene Houston, MD;  [facility];  Service: Endoscopy;;  colon small bowel   REVERSE SHOULDER ARTHROPLASTY Left 07/17/2018   REVERSE SHOULDER ARTHROPLASTY Left 07/17/2018   Procedure: LEFT REVERSE SHOULDER ARTHROPLASTY;  Surgeon: Justice Britain, MD;  Location: [place];  Service: Orthopedics;  Laterality: Left;  179min   SPLENECTOMY  2006   TYMPANOSTOMY TUBE PLACEMENT     UPPER GASTROINTESTINAL ENDOSCOPY     Done at [facility]  Social History:  reports that she has never smoked. She has never used smokeless tobacco. She reports that she does not drink alcohol or use drugs. Patient lives at home  Allergies  Allergen Reactions   Ciprofloxacin Itching   Codeine Nausea Only    Family History  Problem Relation Age of Onset   Prostate cancer Father    Colon cancer Father    Colon cancer  Sister    Lung cancer Sister    Healthy Son    Alcohol abuse Brother    Allergic rhinitis Neg Hx    Asthma Neg Hx    Eczema Neg Hx    Urticaria Neg Hx       Prior to Admission medications   Medication Sig Start Date End Date Taking? Authorizing Provider  alendronate (FOSAMAX) 70 MG tablet TAKE 1 TABLET BY MOUTH ONCE A WEEK WITH A FULL GLASS OF WATER ON AN EMPTY STOMACH 08/03/19  Yes Hawks, Christy A, FNP  ALPRAZolam (XANAX) 0.5 MG tablet Take 1 tablet (0.5 mg total) by mouth at bedtime as needed. 04/13/19  Yes Hawks, Christy A, FNP  amoxicillin-clavulanate (AUGMENTIN) 875-125 MG tablet Take 1 tablet by mouth every 12 (twelve) hours. 09/22/19  Yes [provider]  aspirin EC 81 MG tablet Take 81 mg by mouth daily.   Yes [provider]  Biotin 10000 MCG TABS Take 10,000 mcg by mouth daily.   Yes [provider]  CALCIUM CITRATE PO Take 1,200 mg by mouth 2 (two) times daily.    Yes [provider]  cephALEXin (KEFLEX) 500 MG capsule Take 1 capsule (500 mg total) by mouth 2 (two) times daily. 09/17/19  Yes Hawks, Alyse Low A, FNP  cetirizine (ZYRTEC) 10 MG tablet Take 1-2 tablets 2 times a day max 40mg  04/13/19  Yes Hawks, Christy A, FNP  Cholecalciferol (VITAMIN D3) 75 MCG (3000 UT) TABS Take by mouth.   Yes [provider]  cyclobenzaprine (FLEXERIL) 5 MG tablet Take 1 tablet (5 mg total) by mouth 3 (three) times daily as needed for muscle spasms. Patient taking differently: Take 5 mg by mouth 2 (two) times daily as needed for muscle spasms.  09/12/17  Yes Hawks, Alyse Low A, FNP  hydrOXYzine (ATARAX/VISTARIL) 25 MG tablet TAKE 1 TABLET THREE TIMES DAILY AS NEEDED FOR ITCHING 09/08/18  Yes Terald Sleeper, PA-C  levothyroxine (SYNTHROID, LEVOTHROID) 125 MCG tablet Take 1 tablet (125 mcg total) by mouth daily. 11/26/18  Yes Hawks, Christy A, FNP  losartan (COZAAR) 25 MG tablet Take 1 tablet (25 mg total) by mouth daily. 04/13/19  Yes Hawks, Christy A, FNP    magnesium oxide (MAG-OX) 400 MG tablet Take 400 mg by mouth daily.   Yes [provider]  metoprolol succinate (TOPROL XL) 25 MG 24 hr tablet Take 1 tablet (25 mg total) by mouth daily. 02/02/19 02/02/20 Yes Hawks, Christy A, FNP  montelukast (SINGULAIR) 10 MG tablet Take 1 tablet (10 mg total) by mouth at bedtime. 10/21/18  Yes Kozlow, Donnamarie Poag, MD  ondansetron (ZOFRAN ODT) 4 MG disintegrating tablet Take 1 tablet (4 mg total) by mouth every 8 (eight) hours as needed. 07/01/19  Yes Fredia Sorrow, MD  pantoprazole (PROTONIX) 40 MG tablet Take 1 tablet (40 mg total) by mouth 2 (two) times daily. 08/28/19  Yes Hawks, Alyse Low A, FNP  Probiotic Product (PROBIOTIC-10 PO) Take by mouth daily.   Yes [provider]  valACYclovir (VALTREX) 500 MG tablet TAKE ONE (1) TABLET THREE (3) TIMES  EACH DAY 06/25/19  Yes Terald Sleeper, PA-C  Vitamin D, Ergocalciferol, (DRISDOL) 1.25 MG (50000 UT) CAPS capsule Take 1 capsule (50,000 Units total) by mouth once a week. 06/02/19  Yes Hawks, Christy A, FNP  vitamin E (VITAMIN E) 400 UNIT capsule Take 400 Units by mouth daily.   Yes [provider]  ZORTRESS 0.5 MG TABS TAKE 5 TABLETS BY MOUTH EVERY 12 HOURS 12/12/18  Yes [provider]  EPINEPHrine 0.3 mg/0.3 mL IJ SOAJ injection Use as directed for severe allergic reaction 10/21/18   Kozlow, Donnamarie Poag, MD  famotidine (PEPCID) 20 MG tablet Take 1 tablet (20 mg total) by mouth daily. Take 1 tablet twice a day Patient taking differently: Take 20 mg by mouth at bedtime. Take 1 tablet twice a day 04/13/19   [name] A, FNP  fluorouracil (EFUDEX) 5 % cream Apply 1 application topically daily as needed (cancer spots).  06/11/18   [provider]  HYDROcodone-acetaminophen (NORCO/VICODIN) 5-325 MG tablet Take 1 tablet by mouth 3 (three) times daily as needed for moderate pain.    [provider]  HYDROcodone-acetaminophen (NORCO/VICODIN) 5-325 MG tablet Take 1 tablet by mouth  every 6 (six) hours as needed. 07/01/19   Fredia Sorrow, MD  hydrocortisone cream 1 % Apply 1 application topically daily as needed for itching.    [provider]  Polypodium Leucotomos (HELIOCARE) 240 MG CAPS Take by mouth daily. Patient states that she takes 2 daily.    [provider]  ursodiol (ACTIGALL) 250 MG tablet Take 300 mg by mouth 2 (two) times daily.     [provider]  vitamin B-12 (CYANOCOBALAMIN) 1000 MCG tablet Take 1,000 mcg by mouth daily.    [provider]    Physical Exam: BP (!) 156/77 (BP Location: Right Arm)    Pulse 79    Temp 98.2 F (36.8 C) (Oral)    Resp 12    Ht 5\' 4"  (1.626 m)    Wt 64.4 kg    SpO2 93%    BMI 24.37 kg/m    General: Older female. Awake and alert and oriented x3. No acute cardiopulmonary distress.   HEENT: Normocephalic atraumatic.  Right and left ears normal in appearance.  Pupils equal, round, reactive to light. Extraocular muscles are intact. Sclerae anicteric and noninjected.  Moist mucosal membranes. No mucosal lesions.   Neck: Neck supple without lymphadenopathy. No carotid bruits. No masses palpated.   Cardiovascular: Regular rate with normal S1-S2 sounds. No murmurs, rubs, gallops auscultated. No JVD.   Respiratory: Good respiratory effort with no wheezes, rales, rhonchi. Lungs clear to auscultation bilaterally.  No accessory muscle use.  Abdomen: Soft, nontender, nondistended. Active bowel sounds. No masses or hepatosplenomegaly   Skin: No rashes, lesions, or ulcerations.  Dry, warm to touch. 2+ dorsalis pedis and radial pulses.  Musculoskeletal: No calf or leg pain.  Gross anterior displacement of left shoulder.  Otherwise good ROM.  No contractures   Psychiatric: Intact judgment and insight. Pleasant and cooperative.  Neurologic: No focal neurological deficits. Strength is 5/5 and symmetric in upper and lower extremities.  Cranial nerves II through XII are grossly intact.            Labs on Admission: I have personally reviewed following labs and imaging studies  CBC: Recent Labs  Lab 09/26/19 1721  WBC 8.2  NEUTROABS 4.4  HGB 9.1*  HCT 31.3*  MCV 86.7  PLT 123XX123*   Basic Metabolic Panel: Recent Labs  Lab 09/26/19 1721  NA 136  K 3.8  CL 106  CO2 24  GLUCOSE 106*  BUN 12  CREATININE 0.63  CALCIUM 8.0*   GFR: Estimated Creatinine Clearance: 58.1 mL/min (by C-G formula based on SCr of 0.63 mg/dL). Liver Function Tests: Recent Labs  Lab 09/26/19 1721  AST 23  ALT 17  ALKPHOS 147*  BILITOT 0.3  PROT 7.1  ALBUMIN 3.2*   No results for input(s): LIPASE, AMYLASE in the last 168 hours. No results for input(s): AMMONIA in the last 168 hours. Coagulation Profile: No results for input(s): INR, PROTIME in the last 168 hours. Cardiac Enzymes: No results for input(s): CKTOTAL, CKMB, CKMBINDEX, TROPONINI in the last 168 hours. BNP (last 3 results) No results for input(s): PROBNP in the last 8760 hours. HbA1C: No results for input(s): HGBA1C in the last 72 hours. CBG: No results for input(s): GLUCAP in the last 168 hours. Lipid Profile: No results for input(s): CHOL, HDL, LDLCALC, TRIG, CHOLHDL, LDLDIRECT in the last 72 hours. Thyroid Function Tests: No results for input(s): TSH, T4TOTAL, FREET4, T3FREE, THYROIDAB in the last 72 hours. Anemia Panel: No results for input(s): VITAMINB12, FOLATE, FERRITIN, TIBC, IRON, RETICCTPCT in the last 72 hours. Urine analysis:    Component Value Date/Time   COLORURINE YELLOW 04/04/2018 2230   APPEARANCEUR Cloudy (A) 06/09/2019 1135   LABSPEC >1.046 (H) 04/04/2018 2230   PHURINE 7.0 04/04/2018 2230   GLUCOSEU Negative 06/09/2019 1135   HGBUR MODERATE (A) 04/04/2018 2230   BILIRUBINUR Negative 06/09/2019 1135   KETONESUR 80 (A) 04/04/2018 2230   PROTEINUR 2+ (A) 06/09/2019 1135   PROTEINUR NEGATIVE 04/04/2018 2230   UROBILINOGEN 0.2 10/04/2014 0859   NITRITE Positive (A) 06/09/2019 1135   NITRITE  NEGATIVE 04/04/2018 2230   LEUKOCYTESUR 3+ (A) 06/09/2019 1135   Sepsis Labs: @LABRCNTIP (procalcitonin:4,lacticidven:4) )No results found for this or any previous visit (from the past 240 hour(s)).   Radiological Exams on Admission: Dg Shoulder Left  Result Date: 09/26/2019 CLINICAL DATA:  Shoulder deformity EXAM: LEFT SHOULDER - 2+ VIEW COMPARISON:  Chest x-ray 07/01/2019 FINDINGS: Status post reverse shoulder replacement. Humerus component is positioned cephalad to the humeral component. [facility] joint appears intact. IMPRESSION: Status post reverse shoulder replacement with cephalad displacement of the humerus component with respect to the glenoid component. Electronically Signed   By: Donavan Foil M.D.   On: 09/26/2019 16:08    Assessment/Plan: Principal Problem:   Anterior dislocation of left shoulder Active Problems:   Hypothyroidism   Hx of liver transplant ([place])   Essential hypertension, benign   GAD (generalized anxiety disorder)   History of colon cancer   Gastroesophageal reflux disease   S/p reverse total shoulder arthroplasty    This patient was discussed with the ED physician, including pertinent vitals, physical exam findings, labs, and imaging.  We also discussed care given by the ED provider.  1. Anterior dislocation of left shoulder a. Admit b. Pain control c. N.p.o. after midnight d. Ortho to consult and take to the OR tomorrow 2. Status post reverse total shoulder arthroplasty 3. History of liver transplant a. Continue immunosuppressants 4. GERD a. Continue Pepcid and PPI 5. Hypertension a. New home medications 6. History of colon cancer status post colectomy 7. Hypothyroidism a. Continue Synthroid  DVT prophylaxis: SCDs Consultants: Ortho Code Status: Full Family Communication: Husband present during interview and exam Disposition Plan: Patient should be able to return home following reduction of shoulder   Truett Mainland, DO

## 2019-09-27 ENCOUNTER — Observation Stay (HOSPITAL_COMMUNITY): Payer: Medicare Other | Admitting: Anesthesiology

## 2019-09-27 ENCOUNTER — Encounter (HOSPITAL_COMMUNITY): Payer: Self-pay | Admitting: *Deleted

## 2019-09-27 ENCOUNTER — Observation Stay (HOSPITAL_COMMUNITY): Payer: Medicare Other

## 2019-09-27 ENCOUNTER — Encounter (HOSPITAL_COMMUNITY)
Admission: EM | Disposition: A | Discharge status: Home / Self Care | Payer: Self-pay | Source: Home / Self Care | Attending: Emergency Medicine

## 2019-09-27 DIAGNOSIS — T84028A Dislocation of other internal joint prosthesis, initial encounter: Secondary | ICD-10-CM | POA: Diagnosis not present

## 2019-09-27 DIAGNOSIS — E039 Hypothyroidism, unspecified: Secondary | ICD-10-CM | POA: Diagnosis not present

## 2019-09-27 DIAGNOSIS — D509 Iron deficiency anemia, unspecified: Secondary | ICD-10-CM | POA: Diagnosis not present

## 2019-09-27 DIAGNOSIS — Z471 Aftercare following joint replacement surgery: Secondary | ICD-10-CM | POA: Diagnosis not present

## 2019-09-27 DIAGNOSIS — I10 Essential (primary) hypertension: Secondary | ICD-10-CM | POA: Diagnosis not present

## 2019-09-27 DIAGNOSIS — F411 Generalized anxiety disorder: Secondary | ICD-10-CM | POA: Diagnosis not present

## 2019-09-27 DIAGNOSIS — Z96612 Presence of left artificial shoulder joint: Secondary | ICD-10-CM | POA: Diagnosis not present

## 2019-09-27 DIAGNOSIS — S43015S Anterior dislocation of left humerus, sequela: Secondary | ICD-10-CM | POA: Diagnosis not present

## 2019-09-27 DIAGNOSIS — S43015A Anterior dislocation of left humerus, initial encounter: Secondary | ICD-10-CM

## 2019-09-27 HISTORY — PX: SHOULDER CLOSED REDUCTION: SHX1051

## 2019-09-27 LAB — SURGICAL PCR SCREEN
MRSA, PCR: NEGATIVE
Staphylococcus aureus: NEGATIVE

## 2019-09-27 LAB — SARS CORONAVIRUS 2 (TAT 6-24 HRS): SARS Coronavirus 2: NEGATIVE

## 2019-09-27 SURGERY — CLOSED REDUCTION, SHOULDER
Anesthesia: General | Site: Shoulder | Laterality: Left

## 2019-09-27 MED ORDER — MUPIROCIN 2 % EX OINT
1.0000 "application " | TOPICAL_OINTMENT | Freq: Two times a day (BID) | CUTANEOUS | Status: DC
  Administered 2019-09-27: 1 via NASAL
  Filled 2019-09-27: qty 22

## 2019-09-27 MED ORDER — CYCLOBENZAPRINE HCL 5 MG PO TABS: 5.0000 mg | ORAL_TABLET | Freq: Two times a day (BID) | ORAL | Status: DC | PRN

## 2019-09-27 MED ORDER — PROPOFOL 10 MG/ML IV BOLUS
INTRAVENOUS | Status: AC
  Filled 2019-09-27: qty 20

## 2019-09-27 MED ORDER — LIDOCAINE HCL (CARDIAC) PF 100 MG/5ML IV SOSY
PREFILLED_SYRINGE | INTRAVENOUS | Status: DC | PRN
  Administered 2019-09-27: 100 mg via INTRATRACHEAL

## 2019-09-27 MED ORDER — DOCUSATE SODIUM 100 MG PO CAPS: 100.0000 mg | ORAL_CAPSULE | Freq: Two times a day (BID) | ORAL | Status: DC

## 2019-09-27 MED ORDER — ONDANSETRON HCL 4 MG/2ML IJ SOLN: 4.0000 mg | Freq: Four times a day (QID) | INTRAMUSCULAR | Status: DC | PRN

## 2019-09-27 MED ORDER — VALACYCLOVIR HCL 500 MG PO TABS
500.0000 mg | ORAL_TABLET | Freq: Three times a day (TID) | ORAL | Status: DC
  Administered 2019-09-27: 500 mg via ORAL
  Filled 2019-09-27 (×3): qty 1

## 2019-09-27 MED ORDER — MONTELUKAST SODIUM 10 MG PO TABS
10.0000 mg | ORAL_TABLET | Freq: Every day | ORAL | Status: DC
  Administered 2019-09-27: 10 mg via ORAL
  Filled 2019-09-27: qty 1

## 2019-09-27 MED ORDER — ALPRAZOLAM 0.5 MG PO TABS
0.5000 mg | ORAL_TABLET | Freq: Every evening | ORAL | Status: DC | PRN
  Administered 2019-09-27: 0.5 mg via ORAL
  Filled 2019-09-27: qty 1

## 2019-09-27 MED ORDER — LOSARTAN POTASSIUM 25 MG PO TABS
25.0000 mg | ORAL_TABLET | Freq: Every day | ORAL | Status: DC
  Administered 2019-09-27: 25 mg via ORAL
  Filled 2019-09-27: qty 1

## 2019-09-27 MED ORDER — SUCCINYLCHOLINE CHLORIDE 200 MG/10ML IV SOSY
PREFILLED_SYRINGE | INTRAVENOUS | Status: AC
  Filled 2019-09-27: qty 10

## 2019-09-27 MED ORDER — LEVOTHYROXINE SODIUM 25 MCG PO TABS
125.0000 ug | ORAL_TABLET | Freq: Every day | ORAL | Status: DC
  Administered 2019-09-27: 125 ug via ORAL
  Filled 2019-09-27: qty 1

## 2019-09-27 MED ORDER — ACETAMINOPHEN 500 MG PO TABS: 1000.0000 mg | ORAL_TABLET | Freq: Once | ORAL | Status: DC

## 2019-09-27 MED ORDER — FAMOTIDINE 20 MG PO TABS
20.0000 mg | ORAL_TABLET | Freq: Every day | ORAL | Status: DC
  Administered 2019-09-27: 20 mg via ORAL
  Filled 2019-09-27: qty 1

## 2019-09-27 MED ORDER — EPHEDRINE 5 MG/ML INJ
INTRAVENOUS | Status: AC
  Filled 2019-09-27: qty 10

## 2019-09-27 MED ORDER — HYDROXYZINE HCL 25 MG PO TABS
25.0000 mg | ORAL_TABLET | Freq: Three times a day (TID) | ORAL | Status: DC | PRN
  Administered 2019-09-27: 25 mg via ORAL
  Filled 2019-09-27: qty 1

## 2019-09-27 MED ORDER — FENTANYL CITRATE (PF) 100 MCG/2ML IJ SOLN: 25.0000 ug | INTRAMUSCULAR | Status: DC | PRN

## 2019-09-27 MED ORDER — METOPROLOL SUCCINATE ER 25 MG PO TB24
25.0000 mg | ORAL_TABLET | Freq: Every day | ORAL | Status: DC
  Administered 2019-09-27: 25 mg via ORAL
  Filled 2019-09-27: qty 1

## 2019-09-27 MED ORDER — URSODIOL 250 MG PO TABS: 250.0000 mg | ORAL_TABLET | Freq: Two times a day (BID) | ORAL | Status: DC

## 2019-09-27 MED ORDER — LIDOCAINE 2% (20 MG/ML) 5 ML SYRINGE
INTRAMUSCULAR | Status: AC
  Filled 2019-09-27: qty 5

## 2019-09-27 MED ORDER — ENSURE PRE-SURGERY PO LIQD
296.0000 mL | Freq: Once | ORAL | Status: AC
  Administered 2019-09-27: 296 mL via ORAL
  Filled 2019-09-27: qty 296

## 2019-09-27 MED ORDER — ONDANSETRON HCL 4 MG PO TABS: 4.0000 mg | ORAL_TABLET | Freq: Four times a day (QID) | ORAL | Status: DC | PRN

## 2019-09-27 MED ORDER — PANTOPRAZOLE SODIUM 40 MG PO TBEC
40.0000 mg | DELAYED_RELEASE_TABLET | Freq: Two times a day (BID) | ORAL | Status: DC
  Administered 2019-09-27 (×2): 40 mg via ORAL
  Filled 2019-09-27 (×2): qty 1

## 2019-09-27 MED ORDER — SODIUM CHLORIDE 0.9 % IV SOLN
INTRAVENOUS | Status: DC
  Administered 2019-09-27: via INTRAVENOUS

## 2019-09-27 MED ORDER — PROPOFOL 500 MG/50ML IV EMUL
INTRAVENOUS | Status: DC | PRN
  Administered 2019-09-27: 100 mg via INTRAVENOUS

## 2019-09-27 MED ORDER — POLYETHYLENE GLYCOL 3350 17 G PO PACK: 17.0000 g | PACK | Freq: Every day | ORAL | Status: DC | PRN

## 2019-09-27 MED ORDER — LACTATED RINGERS IV SOLN
INTRAVENOUS | Status: DC | PRN
  Administered 2019-09-27: 16:00:00 via INTRAVENOUS

## 2019-09-27 MED ORDER — EVEROLIMUS 0.5 MG PO TABS: 0.5000 mg | ORAL_TABLET | Freq: Two times a day (BID) | ORAL | Status: DC

## 2019-09-27 MED ORDER — PHENYLEPHRINE 40 MCG/ML (10ML) SYRINGE FOR IV PUSH (FOR BLOOD PRESSURE SUPPORT)
PREFILLED_SYRINGE | INTRAVENOUS | Status: AC
  Filled 2019-09-27: qty 10

## 2019-09-27 MED ORDER — POVIDONE-IODINE 10 % EX SWAB: 2.0000 "application " | Freq: Once | CUTANEOUS | Status: DC

## 2019-09-27 SURGICAL SUPPLY — 1 items: SLING ARM FOAM STRAP LRG (SOFTGOODS) ×1 IMPLANT

## 2019-09-27 NOTE — Transfer of Care (Signed)
Immediate Anesthesia Transfer of Care Note  Patient: [name]  Procedure(s) Performed: CLOSED REDUCTION SHOULDER (Left Shoulder)  Patient Location: PACU  Anesthesia Type:General  Level of Consciousness: awake, oriented and patient cooperative  Airway & Oxygen Therapy: Patient Spontanous Breathing  Post-op Assessment: Report given to RN and Post -op Vital signs reviewed and stable  Post vital signs: Reviewed and stable  Last Vitals:  Vitals Value Taken Time  BP    Temp    Pulse 82 09/27/19 1626  Resp    SpO2 98 % 09/27/19 1626  Vitals shown include unvalidated device data.  Last Pain:  Vitals:   09/27/19 1323  TempSrc: Oral  PainSc:       Patients Stated Pain Goal: 0 ([id number])  Complications: No apparent anesthesia complications

## 2019-09-27 NOTE — Brief Op Note (Signed)
09/27/2019  4:18 PM  PATIENT:  Cassidy Barber  68 y.o. female  PRE-OPERATIVE DIAGNOSIS:  dislocated left reverse shoulder arthroplasty  POST-OPERATIVE DIAGNOSIS:  dislocated left reverse shoulder arthroplasty  PROCEDURE:  Procedure(s): CLOSED REDUCTION SHOULDER (Left)  SURGEON:  Surgeon(s) and Role:    Paralee Cancel, MD - Primary  PHYSICIAN ASSISTANT: None  ANESTHESIA:   general  EBL:  None  BLOOD ADMINISTERED:none  DRAINS: none   LOCAL MEDICATIONS USED:  NONE  SPECIMEN:  No Specimen  DISPOSITION OF SPECIMEN:  N/A  COUNTS:  YES, closed procedure with no instruments utilized  TOURNIQUET:  * No tourniquets in log *  DICTATION: .Other Dictation: Dictation Number [id number]  PLAN OF CARE: Discharge to home after PACU  PATIENT DISPOSITION:  PACU - hemodynamically stable.   Delay start of Pharmacological VTE agent (>24hrs) due to surgical blood loss or risk of bleeding: N/A

## 2019-09-27 NOTE — Interval H&P Note (Signed)
History and Physical Interval Note:  09/27/2019 4:16 PM  Cassidy Barber  has presented today for surgery, with the diagnosis of dislocated left shoulder.  The various methods of treatment have been discussed with the patient and family. After consideration of risks, benefits and other options for treatment, the patient has consented to  Procedure(s): CLOSED REDUCTION SHOULDER (Left) as a surgical intervention.  The patient's history has been reviewed, patient examined, no change in status, stable for surgery.  I have reviewed the patient's chart and labs.  Questions were answered to the patient's satisfaction.     Mauri Pole

## 2019-09-27 NOTE — Plan of Care (Signed)
Plan of care discussed.   

## 2019-09-27 NOTE — Discharge Instructions (Signed)
Follow with Primary MD [name], FNP in 7 days   Get CBC, CMP,  checked  by Primary MD next visit.    Activity: As tolerated with Full fall precautions use walker/cane & assistance as needed   Disposition Home    Diet: Heart Healthy, with feeding assistance and aspiration precautions.  On your next visit with your primary care physician please Get Medicines reviewed and adjusted.   Please request your Prim.MD to go over all Hospital Tests and Procedure/Radiological results at the follow up, please get all Hospital records sent to your Prim MD by signing hospital release before you go home.   If you experience worsening of your admission symptoms, develop shortness of breath, life threatening emergency, suicidal or homicidal thoughts you must seek medical attention immediately by calling 911 or calling your MD immediately  if symptoms less severe.  You Must read complete instructions/literature along with all the possible adverse reactions/side effects for all the Medicines you take and that have been prescribed to you. Take any new Medicines after you have completely understood and accpet all the possible adverse reactions/side effects.   Do not drive, operating heavy machinery, perform activities at heights, swimming or participation in water activities or provide baby sitting services if your were admitted for syncope or siezures until you have seen by Primary MD or a Neurologist and advised to do so again.  Do not drive when taking Pain medications.    Do not take more than prescribed Pain, Sleep and Anxiety Medications  Special Instructions: If you have smoked or chewed Tobacco  in the last 2 yrs please stop smoking, stop any regular Alcohol  and or any Recreational drug use.  Wear Seat belts while driving.   Please note  You were cared for by a hospitalist during your hospital stay. If you have any questions about your discharge medications or the care you received  while you were in the hospital after you are discharged, you can call the unit and asked to speak with the hospitalist on call if the hospitalist that took care of you is not available. Once you are discharged, your primary care physician will handle any further medical issues. Please note that NO REFILLS for any discharge medications will be authorized once you are discharged, as it is imperative that you return to your primary care physician (or establish a relationship with a primary care physician if you do not have one) for your aftercare needs so that they can reassess your need for medications and monitor your lab values.

## 2019-09-27 NOTE — Consult Note (Signed)
Reason for Consult: Left shoulder anterior dislocation Referring Physician: Waldron Labs, MD (Hospitalist)  Cassidy Barber is an 68 y.o. female.  HPI:  68 yo female with history of left reverse shoulder arthroplasty about a year ago.  Right hand dominant.  Was trying to prune a cratemirtle yesterday when the weight of the branch that she was supporting in her left hand caused the dislocation.   Attempted reduction in [facility] ER failed to get shoulder reduced.  Transferred to [facility] to have shoulder closed reduced.  Awaiting COVID screening test. No other injuries to report  Past Medical History:  Diagnosis Date  . Anemia of chronic disease 10/26/2013  . Hernia of unspecified site of abdominal cavity without mention of obstruction or gangrene   . Hypertension   . Liver disease   . Lynch syndrome   . Lynch syndrome   . Primary biliary cirrhosis ([place]) 10/26/2013  . Thyroid disease     Past Surgical History:  Procedure Laterality Date  . ABDOMINAL HERNIA REPAIR     Patient's states that she has had 8- 9 hernia surgeries  . ABDOMINAL HYSTERECTOMY    . CHOLECYSTECTOMY  2007  . COLON RESECTION    . COLON SURGERY  2008   Done at [facility]  . COLONOSCOPY     Done at [facility]  . ESOPHAGOGASTRODUODENOSCOPY N/A 08/21/2018   Procedure: ESOPHAGOGASTRODUODENOSCOPY (EGD);  Surgeon: Rogene Houston, MD;  [facility];  Service: Endoscopy;  Laterality: N/A;  . EYE SURGERY     lasix  . FLEXIBLE SIGMOIDOSCOPY N/A 10/20/2015   Procedure: FLEXIBLE SIGMOIDOSCOPY;  Surgeon: Rogene Houston, MD;  [facility];  Service: Endoscopy;  Laterality: N/A;  27 - Dr Laural Golden has meeting until 1:00  . FLEXIBLE SIGMOIDOSCOPY N/A 07/11/2016   Procedure: FLEXIBLE SIGMOIDOSCOPY;  Surgeon: Rogene Houston, MD;  [facility];  Service: Endoscopy;  Laterality: N/A;  1200  . FLEXIBLE SIGMOIDOSCOPY N/A 08/09/2017   Procedure: FLEXIBLE SIGMOIDOSCOPY;  Surgeon: Rogene Houston, MD;  [facility];   Service: Endoscopy;  Laterality: N/A;  1:00  . FLEXIBLE SIGMOIDOSCOPY N/A 08/21/2018   Procedure: FLEXIBLE SIGMOIDOSCOPY;  Surgeon: Rogene Houston, MD;  [facility];  Service: Endoscopy;  Laterality: N/A;  . HERNIA REPAIR    . LIVER TRANSPLANT  [id number]  . multiple skin cancers removed    . POLYPECTOMY  08/09/2017   Procedure: POLYPECTOMY;  Surgeon: Rogene Houston, MD;  [facility];  Service: Endoscopy;;  colon small bowel  . REVERSE SHOULDER ARTHROPLASTY Left 07/17/2018  . REVERSE SHOULDER ARTHROPLASTY Left 07/17/2018   Procedure: LEFT REVERSE SHOULDER ARTHROPLASTY;  Surgeon: Justice Britain, MD;  Location: [place];  Service: Orthopedics;  Laterality: Left;  145min  . SPLENECTOMY  2006  . TYMPANOSTOMY TUBE PLACEMENT    . UPPER GASTROINTESTINAL ENDOSCOPY     Done at [facility]    Family History  Problem Relation Age of Onset  . Prostate cancer Father   . Colon cancer Father   . Colon cancer Sister   . Lung cancer Sister   . Healthy Son   . Alcohol abuse Brother   . Allergic rhinitis Neg Hx   . Asthma Neg Hx   . Eczema Neg Hx   . Urticaria Neg Hx     Social History:  reports that she has never smoked. She has never used smokeless tobacco. She reports that she does not drink alcohol or use drugs.  Allergies:  Allergies  Allergen Reactions  . Ciprofloxacin Itching  . Codeine Nausea Only    Medications:  I have reviewed the patient's current medications. Scheduled: . docusate sodium  100 mg Oral BID  . Everolimus  0.5 mg Per post-pyloric tube BID  . famotidine  20 mg Oral QHS  . levothyroxine  125 mcg Oral Daily  . loratadine  10 mg Oral Daily  . losartan  25 mg Oral Daily  . metoprolol succinate  25 mg Oral Daily  . montelukast  10 mg Oral QHS  . mupirocin ointment  1 application Nasal BID  . pantoprazole  40 mg Oral BID  . ursodiol  250 mg Oral BID  . valACYclovir  500 mg Oral TID    Results for orders placed or performed during the hospital  encounter of 09/26/19 (from the past 24 hour(s))  CBC with Differential     Status: Abnormal   Collection Time: 09/26/19  5:21 PM  Result Value Ref Range   WBC 8.2 4.0 - 10.5 K/uL   RBC 3.61 (L) 3.87 - 5.11 MIL/uL   Hemoglobin 9.1 (L) 12.0 - 15.0 g/dL   HCT 31.3 (L) 36.0 - 46.0 %   MCV 86.7 80.0 - 100.0 fL   MCH 25.2 (L) 26.0 - 34.0 pg   MCHC 29.1 (L) 30.0 - 36.0 g/dL   RDW 14.7 11.5 - 15.5 %   Platelets 465 (H) 150 - 400 K/uL   nRBC 0.0 0.0 - 0.2 %   Neutrophils Relative % 54 %   Neutro Abs 4.4 1.7 - 7.7 K/uL   Lymphocytes Relative 24 %   Lymphs Abs 2.0 0.7 - 4.0 K/uL   Monocytes Relative 14 %   Monocytes Absolute 1.2 (H) 0.1 - 1.0 K/uL   Eosinophils Relative 6 %   Eosinophils Absolute 0.5 0.0 - 0.5 K/uL   Basophils Relative 2 %   Basophils Absolute 0.2 (H) 0.0 - 0.1 K/uL   Immature Granulocytes 0 %   Abs Immature Granulocytes 0.02 0.00 - 0.07 K/uL  Comprehensive metabolic panel     Status: Abnormal   Collection Time: 09/26/19  5:21 PM  Result Value Ref Range   Sodium 136 135 - 145 mmol/L   Potassium 3.8 3.5 - 5.1 mmol/L   Chloride 106 98 - 111 mmol/L   CO2 24 22 - 32 mmol/L   Glucose, Bld 106 (H) 70 - 99 mg/dL   BUN 12 8 - 23 mg/dL   Creatinine, Ser 0.63 0.44 - 1.00 mg/dL   Calcium 8.0 (L) 8.9 - 10.3 mg/dL   Total Protein 7.1 6.5 - 8.1 g/dL   Albumin 3.2 (L) 3.5 - 5.0 g/dL   AST 23 15 - 41 U/L   ALT 17 0 - 44 U/L   Alkaline Phosphatase 147 (H) 38 - 126 U/L   Total Bilirubin 0.3 0.3 - 1.2 mg/dL   GFR calc non Af Amer >60 >60 mL/min   GFR calc Af Amer >60 >60 mL/min   Anion gap 6 5 - 15  Surgical PCR screen     Status: None   Collection Time: 09/27/19 12:23 AM   Specimen: Nasal Mucosa; Nasal Swab  Result Value Ref Range   MRSA, PCR NEGATIVE NEGATIVE   Staphylococcus aureus NEGATIVE NEGATIVE    X-ray: CLINICAL DATA:  Shoulder deformity  EXAM: LEFT SHOULDER - 2+ VIEW  COMPARISON:  Chest x-ray 07/01/2019  FINDINGS: Status post reverse shoulder  replacement. Humerus component is positioned cephalad to the humeral  component. The [facility] joint appears intact.  IMPRESSION: Status post reverse shoulder replacement with cephalad displacement of the humerus component with respect to the glenoid component.   Electronically Signed   By: Donavan Foil M.D.  ROS  As per HPI - Pt denies any fevers, chills, nausea, vomiting, diarrhea, constipation, abdominal pain, shortness of breath, dyspnea on exertion, orthopnea, cough, wheezing, palpitations, headache, vision changes, lightheadedness, dizziness, melena, rectal bleeding.  Review of systems are otherwise negative   Blood pressure (!) 157/72, pulse 89, temperature 97.6 F (36.4 C), temperature source Oral, resp. rate 18, height 5\' 4"  (1.626 m), weight 64.6 kg, SpO2 97 %.  Physical Exam  Awake alert LUE in sling with ice over shoulder girdle NVI  Per admission H&P -   General: Older female. Awake and alert and oriented x3. No acute cardiopulmonary distress.   HEENT: Normocephalic atraumatic.  Right and left ears normal in appearance.  Pupils equal, round, reactive to light. Extraocular muscles are intact. Sclerae anicteric and noninjected.  Moist mucosal membranes. No mucosal lesions.   Neck: Neck supple without lymphadenopathy. No carotid bruits. No masses palpated.   Cardiovascular: Regular rate with normal S1-S2 sounds. No murmurs, rubs, gallops auscultated. No JVD.   Respiratory: Good respiratory effort with no wheezes, rales, rhonchi. Lungs clear to auscultation bilaterally.  No accessory muscle use.  Abdomen: Soft, nontender, nondistended. Active bowel sounds. No masses or hepatosplenomegaly   Skin: No rashes, lesions, or ulcerations.  Dry, warm to touch. 2+ dorsalis pedis and radial pulses.  Musculoskeletal: No calf or leg pain.  Gross anterior displacement of left shoulder.  Otherwise good ROM.  No contractures   Psychiatric: Intact judgment and insight. Pleasant and  cooperative.  Neurologic: No focal neurological deficits. Strength is 5/5 and symmetric in upper and lower extremities.  Cranial nerves II through XII are grossly intact.  Assessment/Plan: Acute anterior left shoulder arthroplasty dislocation  Awaiting COVID screening before taking her to the OR for closed reduction Likely will go home from PACU with arm in sling and follow up with [facility] in 1-2 weeks  Mauri Pole 09/27/2019, 7:42 AM

## 2019-09-27 NOTE — Anesthesia Postprocedure Evaluation (Signed)
Anesthesia Post Note  Patient: Cassidy Barber  Procedure(s) Performed: CLOSED REDUCTION SHOULDER (Left Shoulder)     Patient location during evaluation: PACU Anesthesia Type: General Level of consciousness: awake and alert Pain management: pain level controlled Vital Signs Assessment: post-procedure vital signs reviewed and stable Respiratory status: spontaneous breathing, nonlabored ventilation and respiratory function stable Cardiovascular status: blood pressure returned to baseline and stable Postop Assessment: no apparent nausea or vomiting Anesthetic complications: no    Last Vitals:  Vitals:   09/27/19 1630 09/27/19 1645  BP: 110/60 111/69  Pulse: 82 83  Resp: 12 13  Temp:  36.9 C  SpO2: 95% 95%    Last Pain:  Vitals:   09/27/19 1645  TempSrc:   PainSc: 0-No pain                 Licia Harl,W. EDMOND

## 2019-09-27 NOTE — Anesthesia Preprocedure Evaluation (Addendum)
Anesthesia Evaluation  Patient identified by MRN, date of birth, ID band Patient awake    Reviewed: Allergy & Precautions, H&P , NPO status , Patient's Chart, lab work & pertinent test results, reviewed documented beta blocker date and time   Airway Mallampati: II  TM Distance: >3 FB Neck ROM: Full    Dental no notable dental hx. (+) Teeth Intact, Dental Advisory Given   Pulmonary neg pulmonary ROS,    Pulmonary exam normal breath sounds clear to auscultation       Cardiovascular hypertension, Pt. on medications and Pt. on home beta blockers  Rhythm:Regular Rate:Normal     Neuro/Psych Anxiety negative neurological ROS     GI/Hepatic Neg liver ROS, GERD  Medicated and Controlled,  Endo/Other  Hypothyroidism   Renal/GU negative Renal ROS  negative genitourinary   Musculoskeletal   Abdominal   Peds  Hematology  (+) Blood dyscrasia, anemia ,   Anesthesia Other Findings   Reproductive/Obstetrics negative OB ROS                            Anesthesia Physical Anesthesia Plan  ASA: II  Anesthesia Plan: General   Post-op Pain Management:    Induction: Intravenous  PONV Risk Score and Plan: 4 or greater and Ondansetron, Dexamethasone and Treatment may vary due to age or medical condition  Airway Management Planned: Mask  Additional Equipment:   Intra-op Plan:   Post-operative Plan:   Informed Consent: I have reviewed the patients History and Physical, chart, labs and discussed the procedure including the risks, benefits and alternatives for the proposed anesthesia with the patient or authorized representative who has indicated his/her understanding and acceptance.     Dental advisory given  Plan Discussed with: CRNA  Anesthesia Plan Comments:         Anesthesia Quick Evaluation

## 2019-09-27 NOTE — Op Note (Signed)
NAME: Cassidy Barber, Cassidy Barber MEDICAL RECORD [id number] ACCOUNT [id number] DATE OF BIRTH:[birth year] FACILITY: [facility] LOCATION: [facility] PHYSICIAN:Raima Geathers DAlvan Dame, MD  OPERATIVE REPORT  DATE OF PROCEDURE:  09/27/2019  PREOPERATIVE DIAGNOSIS:  Dislocated left reverse shoulder, anteriorly.  POSTOPERATIVE DIAGNOSIS:  Dislocated left reverse shoulder, anteriorly.  PROCEDURE:  Closed reduction of left shoulder under anesthesia.  SURGEON:  Paralee Cancel, MD  ASSISTANT:  Surgical team.  ANESTHESIA:  General.  DRAINS:  None.  COMPLICATIONS:  None.  INDICATIONS:  The patient is a 68 year old female with history of a left reverse shoulder arthroplasty performed little over a year ago.  She was in her normal state of health.  She is right hand dominant.  She was pruning a crepe myrtle tree, holding  the branch with her left extremity while she was sawing with her dominant right hand.  When she cut through the branch, she felt a pain in her shoulder.  She was initially seen at [facility] Emergency Room.  She was diagnosed with an anterior dislocated  shoulder.  Attempt in the ER failed to adequately sedate her enough to reduce the shoulder.  For that reason, she was transferred to the [place] at [facility].  Pending a clearance of her COVID-19 screening test.  We scheduled her for  surgery.  The risks and necessity of the procedure discussed.  Follow up plan with Dr. Onnie Graham as reviewed.  Consent was obtained for benefit of pain relief.  DESCRIPTION OF PROCEDURE:  The patient was brought to the operative theater.  Once adequate anesthesia was established, a  preoperative timeout performed identifying the patient, the planned procedure and extremity.  Once the time-out was performed, I  applied gentle traction as well as posterior pressure on her palpable proximal humerus, reducing the shoulder palpably.  The shoulder moved freely at that point.  I then internally rotated the upper extremity to  place it onto her abdomen.  A plain x-ray  was taken showing reduction of the shoulder.  Once this was done, we placed her in a sling and woke her from anesthesia.  She was then transferred to the recovery room in stable condition.  She will be discharged from the PACU.  She will follow up with  Dr. Onnie Graham in 1-2 weeks for radiographs and further planning.  VN/NUANCE  D:09/27/2019 T:09/27/2019 JOB:[id number]

## 2019-09-27 NOTE — Progress Notes (Signed)
Voided prior to transport to OR at this time.

## 2019-09-27 NOTE — Progress Notes (Signed)
PROGRESS NOTE                                                                                                                                                                                                             Patient Demographics:    Cassidy Barber, is a 68 y.o. female, DOB - [birth year], [id number]  Admit date - 09/26/2019   Admitting Physician Truett Mainland, DO  Outpatient Primary MD for the patient is [name], FNP  LOS - 0   Chief Complaint  Patient presents with  . Shoulder Injury       Brief Narrative    Cassidy Barber is a 68 y.o. female with a history of primary biliary cirrhosis status post liver transplant 6 years ago, history of colon cancer secondary to Lynch syndrome status post colon resection, hypertension, GERD, hypothyroidism.  Patient has a history of a left shoulder replacement.  Patient seen for left shoulder pain that started approximately an hour prior to arrival.  She was pruning and the blade kicked back and she immediately had left shoulder pain.  Movement increases the pain.  She has received IV pain medicine in the ER which improved the pain.  No other palliating or provoking factors.  Patient was transferred to [facility], for close reduction in OR, COVID-19 test still pending.   Subjective:    Cassidy Barber today has, No headache, No chest pain, No abdominal pain - No Nausea, No new weakness tingling or numbness, reports pain in left shoulder is controlled.   Assessment  & Plan :    Principal Problem:   Anterior dislocation of left shoulder Active Problems:   Hypothyroidism   Hx of liver transplant ([place])   Essential hypertension, benign   GAD (generalized anxiety disorder)   History of colon cancer   Gastroesophageal reflux disease   S/p reverse total shoulder arthroplasty  Anterior dislocation of left shoulder  -Therapeutic consult greatly appreciated, plan to take to the OR for closed  reduction, likely will go home from PACU with arm in sling and to follow-up with Dr. Onnie Graham in 1 to 2 weeks. -COVID-19 test results pending before she can go to the OR, discussed with lab, hopefully results will be available between 330 to 4 PM.  History of liver transplant -Continue with her home immunosuppressant  GERD -  Continue with PPI  Hypertension - Continue home medications  History of colon cancer status post colectomy  Hypothyroidism - Continue Synthroid  COVID-19 Labs  No results for input(s): DDIMER, FERRITIN, LDH, CRP in the last 72 hours.  No results found for: SARSCOV2NAA   Code Status : Full  Family Communication  : none at bedside  Disposition Plan  : Home  Consults  :  Ortho  Procedures  : None  DVT Prophylaxis  :  SCD  Lab Results  Component Value Date   PLT 465 (H) 09/26/2019    Antibiotics  :    Anti-infectives (From admission, onward)   Start     Dose/Rate Route Frequency Ordered Stop   09/27/19 1000  valACYclovir (VALTREX) tablet 500 mg     500 mg Oral 3 times daily 09/27/19 0007          Objective:   Vitals:   09/26/19 1945 09/26/19 2015 09/27/19 0005 09/27/19 0503  BP: 139/87 (!) 146/65 (!) 170/81 (!) 157/72  Pulse:  84 77 89  Resp: 12 11  18   Temp:   97.7 F (36.5 C) 97.6 F (36.4 C)  TempSrc:   Oral Oral  SpO2:  93% 99% 97%  Weight:   64.6 kg   Height:   5\' 4"  (1.626 m)     Wt Readings from Last 3 Encounters:  09/27/19 64.6 kg  08/28/19 63.9 kg  07/01/19 67.1 kg     Intake/Output Summary (Last 24 hours) at 09/27/2019 1220 Last data filed at 09/27/2019 0600 Gross per 24 hour  Intake 1106.82 ml  Output 400 ml  Net 706.82 ml     Physical Exam  Awake Alert, Oriented X 3, No new F.N deficits, Normal affect Symmetrical Chest wall movement, Good air movement bilaterally, CTAB RRR,No Gallops,Rubs or new Murmurs, No Parasternal Heave +ve B.Sounds, Abd Soft, No tenderness, No rebound - guarding or rigidity. No  Cyanosis, Clubbing or edema, patient is wearing a sling in her left upper extremity.    Data Review:    CBC Recent Labs  Lab 09/26/19 1721  WBC 8.2  HGB 9.1*  HCT 31.3*  PLT 465*  MCV 86.7  MCH 25.2*  MCHC 29.1*  RDW 14.7  LYMPHSABS 2.0  MONOABS 1.2*  EOSABS 0.5  BASOSABS 0.2*    Chemistries  Recent Labs  Lab 09/26/19 1721  NA 136  K 3.8  CL 106  CO2 24  GLUCOSE 106*  BUN 12  CREATININE 0.63  CALCIUM 8.0*  AST 23  ALT 17  ALKPHOS 147*  BILITOT 0.3   ------------------------------------------------------------------------------------------------------------------ No results for input(s): CHOL, HDL, LDLCALC, TRIG, CHOLHDL, LDLDIRECT in the last 72 hours.  No results found for: HGBA1C ------------------------------------------------------------------------------------------------------------------ No results for input(s): TSH, T4TOTAL, T3FREE, THYROIDAB in the last 72 hours.  Invalid input(s): FREET3 ------------------------------------------------------------------------------------------------------------------ No results for input(s): VITAMINB12, FOLATE, FERRITIN, TIBC, IRON, RETICCTPCT in the last 72 hours.  Coagulation profile No results for input(s): INR, PROTIME in the last 168 hours.  No results for input(s): DDIMER in the last 72 hours.  Cardiac Enzymes No results for input(s): CKMB, TROPONINI, MYOGLOBIN in the last 168 hours.  Invalid input(s): CK ------------------------------------------------------------------------------------------------------------------ No results found for: BNP  Inpatient Medications  Scheduled Meds: . docusate sodium  100 mg Oral BID  . Everolimus  0.5 mg Per post-pyloric tube BID  . famotidine  20 mg Oral QHS  . levothyroxine  125 mcg Oral Daily  . loratadine  10 mg Oral Daily  .  losartan  25 mg Oral Daily  . metoprolol succinate  25 mg Oral Daily  . montelukast  10 mg Oral QHS  . mupirocin ointment  1  application Nasal BID  . pantoprazole  40 mg Oral BID  . ursodiol  250 mg Oral BID  . valACYclovir  500 mg Oral TID   Continuous Infusions: . sodium chloride 125 mL/hr at 09/27/19 0600   PRN Meds:.ALPRAZolam, cyclobenzaprine, HYDROmorphone (DILAUDID) injection, hydrOXYzine, ondansetron **OR** ondansetron (ZOFRAN) IV, polyethylene glycol  Micro Results Recent Results (from the past 240 hour(s))  Surgical PCR screen     Status: None   Collection Time: 09/27/19 12:23 AM   Specimen: Nasal Mucosa; Nasal Swab  Result Value Ref Range Status   MRSA, PCR NEGATIVE NEGATIVE Final   Staphylococcus aureus NEGATIVE NEGATIVE Final    Comment: (NOTE) The Xpert SA Assay (FDA approved for NASAL specimens in patients 85 years of age and older), is one component of a comprehensive surveillance program. It is not intended to diagnose infection nor to guide or monitor treatment. Performed at [facility], [place] [address], [place], [place] [address]     Radiology Reports Dg Shoulder 1v Left  Result Date: 09/26/2019 CLINICAL DATA:  Dislocation left shoulder EXAM: LEFT SHOULDER COMPARISON:  09/26/2019 FINDINGS: Prior reverse left shoulder replacement. Continued dislocation with humeral component projecting cephalad to the glenoid component. No real change since earlier study. IMPRESSION: Continued dislocation of the left shoulder replacement.  No change. Electronically Signed   By: Rolm Baptise M.D.   On: 09/26/2019 19:29   Dg Shoulder Left  Result Date: 09/26/2019 CLINICAL DATA:  Shoulder deformity EXAM: LEFT SHOULDER - 2+ VIEW COMPARISON:  Chest x-ray 07/01/2019 FINDINGS: Status post reverse shoulder replacement. Humerus component is positioned cephalad to the humeral component. The [facility] joint appears intact. IMPRESSION: Status post reverse shoulder replacement with cephalad displacement of the humerus component with respect to the glenoid component. Electronically Signed   By: Donavan Foil M.D.   On: 09/26/2019 16:08      Phillips Climes M.D on 09/27/2019 at 12:20 PM  Between 7am to 7pm - Pager - [phone]  After 7pm go to [email or web address] - password [email or web address]  [facility] -  Office  [phone]

## 2019-09-27 NOTE — H&P (View-Only) (Signed)
Reason for Consult: Left shoulder anterior dislocation Referring Physician: Waldron Labs, MD (Hospitalist)  Cassidy Barber is an 68 y.o. female.  HPI:  68 yo female with history of left reverse shoulder arthroplasty about a year ago.  Right hand dominant.  Was trying to prune a cratemirtle yesterday when the weight of the branch that she was supporting in her left hand caused the dislocation.   Attempted reduction in [facility] failed to get shoulder reduced.  Transferred to [facility] to have shoulder closed reduced.  Awaiting COVID screening test. No other injuries to report  Past Medical History:  Diagnosis Date  . Anemia of chronic disease 10/26/2013  . Hernia of unspecified site of abdominal cavity without mention of obstruction or gangrene   . Hypertension   . Liver disease   . Lynch syndrome   . Lynch syndrome   . Primary biliary cirrhosis ([place]) 10/26/2013  . Thyroid disease     Past Surgical History:  Procedure Laterality Date  . ABDOMINAL HERNIA REPAIR     Patient's states that she has had 8- 9 hernia surgeries  . ABDOMINAL HYSTERECTOMY    . CHOLECYSTECTOMY  2007  . COLON RESECTION    . COLON SURGERY  2008   Done at [facility]  . COLONOSCOPY     Done at [facility]  . ESOPHAGOGASTRODUODENOSCOPY N/A 08/21/2018   Procedure: ESOPHAGOGASTRODUODENOSCOPY (EGD);  Surgeon: Rogene Houston, MD;  [facility];  Service: Endoscopy;  Laterality: N/A;  . EYE SURGERY     lasix  . FLEXIBLE SIGMOIDOSCOPY N/A 10/20/2015   Procedure: FLEXIBLE SIGMOIDOSCOPY;  Surgeon: Rogene Houston, MD;  [facility];  Service: Endoscopy;  Laterality: N/A;  35 - Dr Laural Golden has meeting until 1:00  . FLEXIBLE SIGMOIDOSCOPY N/A 07/11/2016   Procedure: FLEXIBLE SIGMOIDOSCOPY;  Surgeon: Rogene Houston, MD;  [facility];  Service: Endoscopy;  Laterality: N/A;  1200  . FLEXIBLE SIGMOIDOSCOPY N/A 08/09/2017   Procedure: FLEXIBLE SIGMOIDOSCOPY;  Surgeon: Rogene Houston, MD;  [facility];   Service: Endoscopy;  Laterality: N/A;  1:00  . FLEXIBLE SIGMOIDOSCOPY N/A 08/21/2018   Procedure: FLEXIBLE SIGMOIDOSCOPY;  Surgeon: Rogene Houston, MD;  [facility];  Service: Endoscopy;  Laterality: N/A;  . HERNIA REPAIR    . LIVER TRANSPLANT  [id number]  . multiple skin cancers removed    . POLYPECTOMY  08/09/2017   Procedure: POLYPECTOMY;  Surgeon: Rogene Houston, MD;  [facility];  Service: Endoscopy;;  colon small bowel  . REVERSE SHOULDER ARTHROPLASTY Left 07/17/2018  . REVERSE SHOULDER ARTHROPLASTY Left 07/17/2018   Procedure: LEFT REVERSE SHOULDER ARTHROPLASTY;  Surgeon: Justice Britain, MD;  Location: [place];  Service: Orthopedics;  Laterality: Left;  158min  . SPLENECTOMY  2006  . TYMPANOSTOMY TUBE PLACEMENT    . UPPER GASTROINTESTINAL ENDOSCOPY     Done at [facility]    Family History  Problem Relation Age of Onset  . Prostate cancer Father   . Colon cancer Father   . Colon cancer Sister   . Lung cancer Sister   . Healthy Son   . Alcohol abuse Brother   . Allergic rhinitis Neg Hx   . Asthma Neg Hx   . Eczema Neg Hx   . Urticaria Neg Hx     Social History:  reports that she has never smoked. She has never used smokeless tobacco. She reports that she does not drink alcohol or use drugs.  Allergies:  Allergies  Allergen Reactions  . Ciprofloxacin Itching  . Codeine Nausea Only    Medications:  I have reviewed the patient's current medications. Scheduled: . docusate sodium  100 mg Oral BID  . Everolimus  0.5 mg Per post-pyloric tube BID  . famotidine  20 mg Oral QHS  . levothyroxine  125 mcg Oral Daily  . loratadine  10 mg Oral Daily  . losartan  25 mg Oral Daily  . metoprolol succinate  25 mg Oral Daily  . montelukast  10 mg Oral QHS  . mupirocin ointment  1 application Nasal BID  . pantoprazole  40 mg Oral BID  . ursodiol  250 mg Oral BID  . valACYclovir  500 mg Oral TID    Results for orders placed or performed during the hospital  encounter of 09/26/19 (from the past 24 hour(s))  CBC with Differential     Status: Abnormal   Collection Time: 09/26/19  5:21 PM  Result Value Ref Range   WBC 8.2 4.0 - 10.5 K/uL   RBC 3.61 (L) 3.87 - 5.11 MIL/uL   Hemoglobin 9.1 (L) 12.0 - 15.0 g/dL   HCT 31.3 (L) 36.0 - 46.0 %   MCV 86.7 80.0 - 100.0 fL   MCH 25.2 (L) 26.0 - 34.0 pg   MCHC 29.1 (L) 30.0 - 36.0 g/dL   RDW 14.7 11.5 - 15.5 %   Platelets 465 (H) 150 - 400 K/uL   nRBC 0.0 0.0 - 0.2 %   Neutrophils Relative % 54 %   Neutro Abs 4.4 1.7 - 7.7 K/uL   Lymphocytes Relative 24 %   Lymphs Abs 2.0 0.7 - 4.0 K/uL   Monocytes Relative 14 %   Monocytes Absolute 1.2 (H) 0.1 - 1.0 K/uL   Eosinophils Relative 6 %   Eosinophils Absolute 0.5 0.0 - 0.5 K/uL   Basophils Relative 2 %   Basophils Absolute 0.2 (H) 0.0 - 0.1 K/uL   Immature Granulocytes 0 %   Abs Immature Granulocytes 0.02 0.00 - 0.07 K/uL  Comprehensive metabolic panel     Status: Abnormal   Collection Time: 09/26/19  5:21 PM  Result Value Ref Range   Sodium 136 135 - 145 mmol/L   Potassium 3.8 3.5 - 5.1 mmol/L   Chloride 106 98 - 111 mmol/L   CO2 24 22 - 32 mmol/L   Glucose, Bld 106 (H) 70 - 99 mg/dL   BUN 12 8 - 23 mg/dL   Creatinine, Ser 0.63 0.44 - 1.00 mg/dL   Calcium 8.0 (L) 8.9 - 10.3 mg/dL   Total Protein 7.1 6.5 - 8.1 g/dL   Albumin 3.2 (L) 3.5 - 5.0 g/dL   AST 23 15 - 41 U/L   ALT 17 0 - 44 U/L   Alkaline Phosphatase 147 (H) 38 - 126 U/L   Total Bilirubin 0.3 0.3 - 1.2 mg/dL   GFR calc non Af Amer >60 >60 mL/min   GFR calc Af Amer >60 >60 mL/min   Anion gap 6 5 - 15  Surgical PCR screen     Status: None   Collection Time: 09/27/19 12:23 AM   Specimen: Nasal Mucosa; Nasal Swab  Result Value Ref Range   MRSA, PCR NEGATIVE NEGATIVE   Staphylococcus aureus NEGATIVE NEGATIVE    X-ray: CLINICAL DATA:  Shoulder deformity  EXAM: LEFT SHOULDER - 2+ VIEW  COMPARISON:  Chest x-ray 07/01/2019  FINDINGS: Status post reverse shoulder  replacement. Humerus component is positioned cephalad to the humeral  component. The [facility] joint appears intact.  IMPRESSION: Status post reverse shoulder replacement with cephalad displacement of the humerus component with respect to the glenoid component.   Electronically Signed   By: Donavan Foil M.D.  ROS  As per HPI - Pt denies any fevers, chills, nausea, vomiting, diarrhea, constipation, abdominal pain, shortness of breath, dyspnea on exertion, orthopnea, cough, wheezing, palpitations, headache, vision changes, lightheadedness, dizziness, melena, rectal bleeding.  Review of systems are otherwise negative   Blood pressure (!) 157/72, pulse 89, temperature 97.6 F (36.4 C), temperature source Oral, resp. rate 18, height 5\' 4"  (1.626 m), weight 64.6 kg, SpO2 97 %.  Physical Exam  Awake alert LUE in sling with ice over shoulder girdle NVI  Per admission H&P -   General: Older female. Awake and alert and oriented x3. No acute cardiopulmonary distress.   HEENT: Normocephalic atraumatic.  Right and left ears normal in appearance.  Pupils equal, round, reactive to light. Extraocular muscles are intact. Sclerae anicteric and noninjected.  Moist mucosal membranes. No mucosal lesions.   Neck: Neck supple without lymphadenopathy. No carotid bruits. No masses palpated.   Cardiovascular: Regular rate with normal S1-S2 sounds. No murmurs, rubs, gallops auscultated. No JVD.   Respiratory: Good respiratory effort with no wheezes, rales, rhonchi. Lungs clear to auscultation bilaterally.  No accessory muscle use.  Abdomen: Soft, nontender, nondistended. Active bowel sounds. No masses or hepatosplenomegaly   Skin: No rashes, lesions, or ulcerations.  Dry, warm to touch. 2+ dorsalis pedis and radial pulses.  Musculoskeletal: No calf or leg pain.  Gross anterior displacement of left shoulder.  Otherwise good ROM.  No contractures   Psychiatric: Intact judgment and insight. Pleasant and  cooperative.  Neurologic: No focal neurological deficits. Strength is 5/5 and symmetric in upper and lower extremities.  Cranial nerves II through XII are grossly intact.  Assessment/Plan: Acute anterior left shoulder arthroplasty dislocation  Awaiting COVID screening before taking her to the OR for closed reduction Likely will go home from PACU with arm in sling and follow up with [facility] in 1-2 weeks  Mauri Pole 09/27/2019, 7:42 AM

## 2019-09-27 NOTE — Discharge Summary (Signed)
Cassidy Barber, is a 68 y.o. female  DOB 03/17/1951  MRN [id number].  Admission date:  09/26/2019  Admitting Physician  Truett Mainland, DO  Discharge Date:  09/27/2019   Primary MD  [name], FNP  Recommendations for primary care physician for things to follow:  -Please check CBC, BMP during next visit -Patient to follow-up with outpatient orthopedic in 1 to 2 weeks from discharge   Admission Diagnosis  Dislocated shoulder, left, initial encounter [S43.005A] Anterior dislocation of left shoulder [S43.015A]   Discharge Diagnosis  Dislocated shoulder, left, initial encounter [S43.005A] Anterior dislocation of left shoulder [S43.015A]    Principal Problem:   Anterior dislocation of left shoulder Active Problems:   Hypothyroidism   Hx of liver transplant ([place])   Essential hypertension, benign   GAD (generalized anxiety disorder)   History of colon cancer   Gastroesophageal reflux disease   S/p reverse total shoulder arthroplasty      Past Medical History:  Diagnosis Date  . Anemia of chronic disease 10/26/2013  . Hernia of unspecified site of abdominal cavity without mention of obstruction or gangrene   . Hypertension   . Liver disease   . Lynch syndrome   . Lynch syndrome   . Primary biliary cirrhosis ([place]) 10/26/2013  . Thyroid disease     Past Surgical History:  Procedure Laterality Date  . ABDOMINAL HERNIA REPAIR     Patient's states that she has had 8- 9 hernia surgeries  . ABDOMINAL HYSTERECTOMY    . CHOLECYSTECTOMY  2007  . COLON RESECTION    . COLON SURGERY  2008   Done at [facility]  . COLONOSCOPY     Done at [facility]  . ESOPHAGOGASTRODUODENOSCOPY N/A 08/21/2018   Procedure: ESOPHAGOGASTRODUODENOSCOPY (EGD);  Surgeon: Rogene Houston, MD;  [facility];  Service: Endoscopy;  Laterality: N/A;  . EYE SURGERY     lasix  . FLEXIBLE SIGMOIDOSCOPY N/A 10/20/2015   Procedure: FLEXIBLE SIGMOIDOSCOPY;  Surgeon: Rogene Houston, MD;  [facility];  Service: Endoscopy;  Laterality: N/A;  79 - Dr Laural Golden has meeting until 1:00  . FLEXIBLE SIGMOIDOSCOPY N/A 07/11/2016   Procedure: FLEXIBLE SIGMOIDOSCOPY;  Surgeon: Rogene Houston, MD;  [facility];  Service: Endoscopy;  Laterality: N/A;  1200  . FLEXIBLE SIGMOIDOSCOPY N/A 08/09/2017   Procedure: FLEXIBLE SIGMOIDOSCOPY;  Surgeon: Rogene Houston, MD;  [facility];  Service: Endoscopy;  Laterality: N/A;  1:00  . FLEXIBLE SIGMOIDOSCOPY N/A 08/21/2018   Procedure: FLEXIBLE SIGMOIDOSCOPY;  Surgeon: Rogene Houston, MD;  [facility];  Service: Endoscopy;  Laterality: N/A;  . HERNIA REPAIR    . LIVER TRANSPLANT  [id number]  . multiple skin cancers removed    . POLYPECTOMY  08/09/2017   Procedure: POLYPECTOMY;  Surgeon: Rogene Houston, MD;  [facility];  Service: Endoscopy;;  colon small bowel  . REVERSE SHOULDER ARTHROPLASTY Left 07/17/2018  . REVERSE SHOULDER ARTHROPLASTY Left 07/17/2018   Procedure: LEFT REVERSE  SHOULDER ARTHROPLASTY;  Surgeon: Justice Britain, MD;  Location: [facility];  Service: Orthopedics;  Laterality: Left;  131min  . SPLENECTOMY  2006  . TYMPANOSTOMY TUBE PLACEMENT    . UPPER GASTROINTESTINAL ENDOSCOPY     Done at [facility]       History of present illness and  Hospital Course:     Kindly see H&P for history of present illness and admission details, please review complete Labs, Consult reports and Test reports for all details in brief  HPI  from the history and physical done on the day of admission 09/26/2019  HPI: Cassidy Barber is a 68 y.o. female with a history of primary biliary cirrhosis status post liver transplant 6 years ago, history of colon cancer secondary to Lynch syndrome status post colon resection, hypertension, GERD, hypothyroidism.  Patient has a history of a left shoulder replacement.  Patient seen for left shoulder pain that  started approximately an hour prior to arrival.  She was pruning and the blade kicked back and she immediately had left shoulder pain.  Movement increases the pain.  She has received IV pain medicine in the ER which improved the pain.  No other palliating or provoking factors.  Emergency Department Course: X-ray shows reverse shoulder replacement with cephalad displacement of humerus component   Hospital Course   Anterior dislocation of left shoulder  -Therapeutic consult greatly appreciated, patient went for closed reduction shoulder by Dr. Zenia Resides 09/27/2019, patient recovered, and patient will be discharged with recommendation to follow as an outpatient with Dr. Onnie Graham in 1 to 2 weeks .  History of liver transplant -Continue with her home immunosuppressant  GERD -Continue with PPI  Hypertension - Continue home medications  History of colon cancer status post colectomy  Hypothyroidism    Discharge Condition: Stable   Follow UP  Follow-up Information    Justice Britain, MD Follow up in 1 week(s).   Specialty: Orthopedic Surgery Contact information: [address] [place] [address] [id number]             Discharge Instructions  and  Discharge Medications     Discharge Instructions    Discharge instructions   Complete by: As directed    Follow with Primary MD [name], FNP in 7 days   Get CBC, CMP,  checked  by Primary MD next visit.    Activity: As tolerated with Full fall precautions use walker/cane & assistance as needed   Disposition Home    Diet: Heart Healthy, with feeding assistance and aspiration precautions.  On your next visit with your primary care physician please Get Medicines reviewed and adjusted.   Please request your Prim.MD to go over all Hospital Tests and Procedure/Radiological results at the follow up, please get all Hospital records sent to your Prim MD by signing hospital release before you go  home.   If you experience worsening of your admission symptoms, develop shortness of breath, life threatening emergency, suicidal or homicidal thoughts you must seek medical attention immediately by calling 911 or calling your MD immediately  if symptoms less severe.  You Must read complete instructions/literature along with all the possible adverse reactions/side effects for all the Medicines you take and that have been prescribed to you. Take any new Medicines after you have completely understood and accpet all the possible adverse reactions/side effects.   Do not drive, operating heavy machinery, perform activities at heights, swimming or participation in water activities or provide baby sitting services if  your were admitted for syncope or siezures until you have seen by Primary MD or a Neurologist and advised to do so again.  Do not drive when taking Pain medications.    Do not take more than prescribed Pain, Sleep and Anxiety Medications  Special Instructions: If you have smoked or chewed Tobacco  in the last 2 yrs please stop smoking, stop any regular Alcohol  and or any Recreational drug use.  Wear Seat belts while driving.   Please note  You were cared for by a hospitalist during your hospital stay. If you have any questions about your discharge medications or the care you received while you were in the hospital after you are discharged, you can call the unit and asked to speak with the hospitalist on call if the hospitalist that took care of you is not available. Once you are discharged, your primary care physician will handle any further medical issues. Please note that NO REFILLS for any discharge medications will be authorized once you are discharged, as it is imperative that you return to your primary care physician (or establish a relationship with a primary care physician if you do not have one) for your aftercare needs so that they can reassess your need for medications and  monitor your lab values.     Allergies as of 09/27/2019      Reactions   Ciprofloxacin Itching   Codeine Nausea Only      Medication List    TAKE these medications   acetaminophen 500 MG tablet Commonly known as: TYLENOL Take 500 mg by mouth every 6 (six) hours as needed for mild pain or headache.   alendronate 70 MG tablet Commonly known as: FOSAMAX TAKE 1 TABLET BY MOUTH ONCE A WEEK WITH A FULL GLASS OF WATER ON AN EMPTY STOMACH What changed: See the new instructions.   ALPRAZolam 0.5 MG tablet Commonly known as: XANAX Take 1 tablet (0.5 mg total) by mouth at bedtime as needed.   amoxicillin-clavulanate 875-125 MG tablet Commonly known as: AUGMENTIN Take 1 tablet by mouth every 12 (twelve) hours.   aspirin EC 81 MG tablet Take 81 mg by mouth daily.   Biotin 10000 MCG Tabs Take 10,000 mcg by mouth daily.   CALCIUM CITRATE PO Take 1,200 mg by mouth 2 (two) times daily.   cephALEXin 500 MG capsule Commonly known as: KEFLEX Take 1 capsule (500 mg total) by mouth 2 (two) times daily.   cetirizine 10 MG tablet Commonly known as: ZYRTEC Take 1-2 tablets 2 times a day max 40mg  What changed:   how much to take  how to take this  when to take this  additional instructions   cyclobenzaprine 5 MG tablet Commonly known as: FLEXERIL Take 1 tablet (5 mg total) by mouth 3 (three) times daily as needed for muscle spasms. What changed: when to take this   EPINEPHrine 0.3 mg/0.3 mL Soaj injection Commonly known as: EPI-PEN Use as directed for severe allergic reaction   famotidine 20 MG tablet Commonly known as: PEPCID Take 1 tablet (20 mg total) by mouth daily. Take 1 tablet twice a day What changed: when to take this   fluorouracil 5 % cream Commonly known as: EFUDEX Apply 1 application topically daily as needed (cancer spots).   HYDROcodone-acetaminophen 5-325 MG tablet Commonly known as: NORCO/VICODIN Take 1 tablet by mouth every 6 (six) hours as needed.    hydrocortisone cream 1 % Apply 1 application topically daily as needed for itching.  hydrOXYzine 25 MG tablet Commonly known as: ATARAX/VISTARIL TAKE 1 TABLET THREE TIMES DAILY AS NEEDED FOR ITCHING   levothyroxine 125 MCG tablet Commonly known as: SYNTHROID Take 1 tablet (125 mcg total) by mouth daily.   losartan 25 MG tablet Commonly known as: COZAAR Take 1 tablet (25 mg total) by mouth daily.   metoprolol succinate 25 MG 24 hr tablet Commonly known as: Toprol XL Take 1 tablet (25 mg total) by mouth daily.   montelukast 10 MG tablet Commonly known as: SINGULAIR Take 1 tablet (10 mg total) by mouth at bedtime.   ondansetron 4 MG disintegrating tablet Commonly known as: Zofran ODT Take 1 tablet (4 mg total) by mouth every 8 (eight) hours as needed.   pantoprazole 40 MG tablet Commonly known as: PROTONIX Take 1 tablet (40 mg total) by mouth 2 (two) times daily.   PROBIOTIC-10 PO Take by mouth daily.   ursodiol 250 MG tablet Commonly known as: ACTIGALL Take 300 mg by mouth 2 (two) times daily.   valACYclovir 500 MG tablet Commonly known as: VALTREX TAKE ONE (1) TABLET THREE (3) TIMES EACH DAY   vitamin B-12 1000 MCG tablet Commonly known as: CYANOCOBALAMIN Take 1,000 mcg by mouth daily.   Vitamin D (Ergocalciferol) 1.25 MG (50000 UT) Caps capsule Commonly known as: DRISDOL Take 1 capsule (50,000 Units total) by mouth once a week.   Vitamin D3 75 MCG (3000 UT) Tabs Take 3,000 Units by mouth daily.   vitamin E 400 UNIT capsule Generic drug: vitamin E Take 400 Units by mouth daily.   Zortress 0.5 MG Tabs Generic drug: Everolimus Take 5 tablets by mouth 2 (two) times daily.         Diet and Activity recommendation: See Discharge Instructions above   Consults obtained -  Orthopedic Dr. Lance Sell   Major procedures and Radiology Reports - PLEASE review detailed and final reports for all details, in brief -     PROCEDURE:  Procedure(s): CLOSED  REDUCTION SHOULDER (Left)  Dg Shoulder 1v Left  Result Date: 09/26/2019 CLINICAL DATA:  Dislocation left shoulder EXAM: LEFT SHOULDER COMPARISON:  09/26/2019 FINDINGS: Prior reverse left shoulder replacement. Continued dislocation with humeral component projecting cephalad to the glenoid component. No real change since earlier study. IMPRESSION: Continued dislocation of the left shoulder replacement.  No change. Electronically Signed   By: Rolm Baptise M.D.   On: 09/26/2019 19:29   Dg Shoulder Left  Result Date: 09/26/2019 CLINICAL DATA:  Shoulder deformity EXAM: LEFT SHOULDER - 2+ VIEW COMPARISON:  Chest x-ray 07/01/2019 FINDINGS: Status post reverse shoulder replacement. Humerus component is positioned cephalad to the humeral component. The [facility] joint appears intact. IMPRESSION: Status post reverse shoulder replacement with cephalad displacement of the humerus component with respect to the glenoid component. Electronically Signed   By: Donavan Foil M.D.   On: 09/26/2019 16:08    Micro Results    Recent Results (from the past 240 hour(s))  SARS CORONAVIRUS 2 (TAT 6-24 HRS) Nasopharyngeal Nasopharyngeal Swab     Status: None   Collection Time: 09/26/19  5:04 PM   Specimen: Nasopharyngeal Swab  Result Value Ref Range Status   SARS Coronavirus 2 NEGATIVE NEGATIVE Final    Comment: (NOTE) SARS-CoV-2 target nucleic acids are NOT DETECTED. The SARS-CoV-2 RNA is generally detectable in upper and lower respiratory specimens during the acute phase of infection. Negative results do not preclude SARS-CoV-2 infection, do not rule out co-infections with other pathogens, and should not be used as the  sole basis for treatment or other patient management decisions. Negative results must be combined with clinical observations, patient history, and epidemiological information. The expected result is Negative. Fact Sheet for Patients: SugarRoll.be Fact Sheet for Healthcare  Providers: https://www.woods-mathews.com/ This test is not yet approved or cleared by the [place] FDA and  has been authorized for detection and/or diagnosis of SARS-CoV-2 by FDA under an Emergency Use Authorization (EUA). This EUA will remain  in effect (meaning this test can be used) for the duration of the COVID-19 declaration under Section 56 4(b)(1) of the Act, 21 U.S.C. section 360bbb-3(b)(1), unless the authorization is terminated or revoked sooner. Performed at [facility], [place] [address], [place], [place] [address]   Surgical PCR screen     Status: None   Collection Time: 09/27/19 12:23 AM   Specimen: Nasal Mucosa; Nasal Swab  Result Value Ref Range Status   MRSA, PCR NEGATIVE NEGATIVE Final   Staphylococcus aureus NEGATIVE NEGATIVE Final    Comment: (NOTE) The Xpert SA Assay (FDA approved for NASAL specimens in patients 86 years of age and older), is one component of a comprehensive surveillance program. It is not intended to diagnose infection nor to guide or monitor treatment. Performed at [facility], [place] [address] [place], [place] [address]        Today   Subjective:   Leafy Half today denies any nausea, vomiting, chest pain or shortness of breath  Objective:   Blood pressure 110/60, pulse 82, temperature 98.4 F (36.9 C), resp. rate 12, height 5\' 4"  (1.626 m), weight 64.6 kg, SpO2 95 %.   Intake/Output Summary (Last 24 hours) at 09/27/2019 1635 Last data filed at 09/27/2019 1626 Gross per 24 hour  Intake 1366.82 ml  Output 400 ml  Net 966.82 ml   Patient was seen and examined earlier before her surgery  Exam Awake Alert, Oriented x 3, No new F.N deficits, Normal affect Symmetrical Chest wall movement, Good air movement bilaterally, CTAB RRR,No Gallops,Rubs or new Murmurs, No Parasternal Heave +ve B.Sounds, Abd Soft, Non tender,No rebound -guarding or rigidity. No Cyanosis, Clubbing or edema, No new  Rash or bruise  Data Review   CBC w Diff:  Lab Results  Component Value Date   WBC 8.2 09/26/2019   HGB 9.1 (L) 09/26/2019   HGB 11.1 04/13/2019   HCT 31.3 (L) 09/26/2019   HCT 34.5 04/13/2019   PLT 465 (H) 09/26/2019   PLT 380 04/13/2019   LYMPHOPCT 24 09/26/2019   MONOPCT 14 09/26/2019   EOSPCT 6 09/26/2019   BASOPCT 2 09/26/2019    CMP:  Lab Results  Component Value Date   NA 136 09/26/2019   NA 143 04/13/2019   K 3.8 09/26/2019   CL 106 09/26/2019   CO2 24 09/26/2019   BUN 12 09/26/2019   BUN 14 04/13/2019   CREATININE 0.63 09/26/2019   CREATININE 0.62 12/23/2012   PROT 7.1 09/26/2019   PROT 6.9 04/13/2019   ALBUMIN 3.2 (L) 09/26/2019   ALBUMIN 3.9 04/13/2019   BILITOT 0.3 09/26/2019   BILITOT <0.2 04/13/2019   ALKPHOS 147 (H) 09/26/2019   AST 23 09/26/2019   ALT 17 09/26/2019  .   Total Time in preparing paper work, data evaluation and todays exam - 30 minutes  Phillips Climes M.D on 09/27/2019 at 4:35 PM  [facility]   Office  [phone]

## 2019-09-27 NOTE — Anesthesia Procedure Notes (Signed)
Procedure Name: General with mask airway Date/Time: 09/27/2019 2:10 PM Performed by: Silas Sacramento, CRNA Pre-anesthesia Checklist: Patient identified, Emergency Drugs available, Suction available and Patient being monitored Patient Re-evaluated:Patient Re-evaluated prior to induction Oxygen Delivery Method: Circle system utilized Preoxygenation: Pre-oxygenation with 100% oxygen Induction Type: IV induction Ventilation: Mask ventilation without difficulty Dental Injury: Teeth and Oropharynx as per pre-operative assessment

## 2019-09-28 ENCOUNTER — Encounter (HOSPITAL_COMMUNITY): Payer: Self-pay | Admitting: Orthopedic Surgery

## 2019-09-30 DIAGNOSIS — Z471 Aftercare following joint replacement surgery: Secondary | ICD-10-CM | POA: Diagnosis not present

## 2019-09-30 DIAGNOSIS — Z96612 Presence of left artificial shoulder joint: Secondary | ICD-10-CM | POA: Diagnosis not present

## 2019-10-02 ENCOUNTER — Telehealth: Payer: Self-pay | Admitting: Family

## 2019-10-04 ENCOUNTER — Other Ambulatory Visit: Payer: Self-pay

## 2019-10-04 ENCOUNTER — Emergency Department (HOSPITAL_COMMUNITY): Payer: Medicare Other

## 2019-10-04 ENCOUNTER — Emergency Department (HOSPITAL_COMMUNITY)
Admission: EM | Admit: 2019-10-04 | Discharge: 2019-10-05 | Disposition: A | Discharge status: Other Acute Inpatient Hospital | Payer: Medicare Other | Attending: Emergency Medicine | Admitting: Emergency Medicine

## 2019-10-04 ENCOUNTER — Encounter (HOSPITAL_COMMUNITY): Payer: Self-pay | Admitting: Emergency Medicine

## 2019-10-04 DIAGNOSIS — R6889 Other general symptoms and signs: Secondary | ICD-10-CM | POA: Insufficient documentation

## 2019-10-04 DIAGNOSIS — Z20828 Contact with and (suspected) exposure to other viral communicable diseases: Secondary | ICD-10-CM | POA: Diagnosis not present

## 2019-10-04 DIAGNOSIS — R509 Fever, unspecified: Secondary | ICD-10-CM | POA: Diagnosis not present

## 2019-10-04 DIAGNOSIS — K56600 Partial intestinal obstruction, unspecified as to cause: Secondary | ICD-10-CM | POA: Insufficient documentation

## 2019-10-04 DIAGNOSIS — D849 Immunodeficiency, unspecified: Secondary | ICD-10-CM | POA: Insufficient documentation

## 2019-10-04 DIAGNOSIS — Z944 Liver transplant status: Secondary | ICD-10-CM | POA: Insufficient documentation

## 2019-10-04 DIAGNOSIS — Z1509 Genetic susceptibility to other malignant neoplasm: Secondary | ICD-10-CM | POA: Insufficient documentation

## 2019-10-04 DIAGNOSIS — K743 Primary biliary cirrhosis: Secondary | ICD-10-CM | POA: Diagnosis not present

## 2019-10-04 DIAGNOSIS — R109 Unspecified abdominal pain: Secondary | ICD-10-CM | POA: Diagnosis not present

## 2019-10-04 DIAGNOSIS — I10 Essential (primary) hypertension: Secondary | ICD-10-CM | POA: Diagnosis not present

## 2019-10-04 LAB — APTT: aPTT: 33 seconds (ref 24–36)

## 2019-10-04 LAB — CBC WITH DIFFERENTIAL/PLATELET
Abs Immature Granulocytes: 0.05 10*3/uL (ref 0.00–0.07)
Basophils Absolute: 0.1 10*3/uL (ref 0.0–0.1)
Basophils Relative: 1 %
Eosinophils Absolute: 0 10*3/uL (ref 0.0–0.5)
Eosinophils Relative: 0 %
HCT: 28.5 % — ABNORMAL LOW (ref 36.0–46.0)
Hemoglobin: 8.5 g/dL — ABNORMAL LOW (ref 12.0–15.0)
Immature Granulocytes: 0 %
Lymphocytes Relative: 3 %
Lymphs Abs: 0.3 10*3/uL — ABNORMAL LOW (ref 0.7–4.0)
MCH: 24.6 pg — ABNORMAL LOW (ref 26.0–34.0)
MCHC: 29.8 g/dL — ABNORMAL LOW (ref 30.0–36.0)
MCV: 82.6 fL (ref 80.0–100.0)
Monocytes Absolute: 0 10*3/uL — ABNORMAL LOW (ref 0.1–1.0)
Monocytes Relative: 0 %
Neutro Abs: 10.9 10*3/uL — ABNORMAL HIGH (ref 1.7–7.7)
Neutrophils Relative %: 96 %
Platelets: 368 10*3/uL (ref 150–400)
RBC: 3.45 MIL/uL — ABNORMAL LOW (ref 3.87–5.11)
RDW: 15.8 % — ABNORMAL HIGH (ref 11.5–15.5)
WBC: 11.8 10*3/uL — ABNORMAL HIGH (ref 4.0–10.5)
nRBC: 0 % (ref 0.0–0.2)

## 2019-10-04 LAB — URINALYSIS, ROUTINE W REFLEX MICROSCOPIC
Bilirubin Urine: NEGATIVE
Glucose, UA: NEGATIVE mg/dL
Ketones, ur: NEGATIVE mg/dL
Leukocytes,Ua: NEGATIVE
Nitrite: NEGATIVE
Protein, ur: NEGATIVE mg/dL
Specific Gravity, Urine: 1.011 (ref 1.005–1.030)
pH: 5 (ref 5.0–8.0)

## 2019-10-04 LAB — COMPREHENSIVE METABOLIC PANEL
ALT: 36 U/L (ref 0–44)
AST: 35 U/L (ref 15–41)
Albumin: 3 g/dL — ABNORMAL LOW (ref 3.5–5.0)
Alkaline Phosphatase: 356 U/L — ABNORMAL HIGH (ref 38–126)
Anion gap: 12 (ref 5–15)
BUN: 14 mg/dL (ref 8–23)
CO2: 21 mmol/L — ABNORMAL LOW (ref 22–32)
Calcium: 8.2 mg/dL — ABNORMAL LOW (ref 8.9–10.3)
Chloride: 97 mmol/L — ABNORMAL LOW (ref 98–111)
Creatinine, Ser: 0.83 mg/dL (ref 0.44–1.00)
GFR calc Af Amer: >60 mL/min (ref >60)
GFR calc non Af Amer: >60 mL/min (ref >60)
Glucose, Bld: 109 mg/dL — ABNORMAL HIGH (ref 70–99)
Potassium: 3.1 mmol/L — ABNORMAL LOW (ref 3.5–5.1)
Sodium: 130 mmol/L — ABNORMAL LOW (ref 135–145)
Total Bilirubin: 0.7 mg/dL (ref 0.3–1.2)
Total Protein: 6.8 g/dL (ref 6.5–8.1)

## 2019-10-04 LAB — PROTIME-INR
INR: 1 (ref 0.8–1.2)
Prothrombin Time: 13.4 seconds (ref 11.4–15.2)

## 2019-10-04 LAB — LACTIC ACID, PLASMA: Lactic Acid, Venous: 1.2 mmol/L (ref 0.5–1.9)

## 2019-10-04 MED ORDER — FENTANYL CITRATE (PF) 100 MCG/2ML IJ SOLN
25.0000 ug | Freq: Once | INTRAMUSCULAR | Status: AC
  Administered 2019-10-05: 25 ug via INTRAVENOUS
  Filled 2019-10-04: qty 2

## 2019-10-04 MED ORDER — FENTANYL CITRATE (PF) 100 MCG/2ML IJ SOLN
25.0000 ug | Freq: Once | INTRAMUSCULAR | Status: AC
  Administered 2019-10-04: 25 ug via INTRAVENOUS
  Filled 2019-10-04: qty 2

## 2019-10-04 MED ORDER — IOHEXOL 300 MG/ML  SOLN
100.0000 mL | Freq: Once | INTRAMUSCULAR | Status: AC | PRN
  Administered 2019-10-04: 100 mL via INTRAVENOUS

## 2019-10-04 MED ORDER — SODIUM CHLORIDE 0.9 % IV BOLUS
1000.0000 mL | Freq: Once | INTRAVENOUS | Status: AC
  Administered 2019-10-04: 1000 mL via INTRAVENOUS

## 2019-10-04 MED ORDER — PIPERACILLIN-TAZOBACTAM 3.375 G IVPB 30 MIN
3.3750 g | Freq: Once | INTRAVENOUS | Status: AC
  Administered 2019-10-04: 3.375 g via INTRAVENOUS
  Filled 2019-10-04: qty 50

## 2019-10-04 NOTE — ED Triage Notes (Signed)
Pt c/o of fever since yesterday and lower back pain. Tmax 104.1 at home. Pt has taken tylenol, hydrocodone and a muscle relaxer. Pt says she was tested for COVID 3 days in a row last weekend and all were negative.

## 2019-10-04 NOTE — ED Notes (Signed)
ED TO INPATIENT HANDOFF REPORT  ED Nurse Name and Phone #: [phone]  S Name/Age/Gender Cassidy Barber November 68 y.o. female Room/Bed: APA05/APA05  Code Status   Code Status: Prior  Home/SNF/Other Home Patient oriented to: self, place, time and situation Is this baseline? Yes   Triage Complete: Triage complete  Chief Complaint Fever  Triage Note Pt c/o of fever since yesterday and lower back pain. Tmax 104.1 at home. Pt has taken tylenol, hydrocodone and a muscle relaxer. Pt says she was tested for COVID 3 days in a row last weekend and all were negative.   Allergies Allergies  Allergen Reactions  . Ciprofloxacin Itching  . Codeine Nausea Only    Level of Care/Admitting Diagnosis ED Disposition    ED Disposition Condition Comment   Admit  The patient appears reasonably stabilized for admission considering the current resources, flow, and capabilities available in the ED at this time, and I doubt any other [facility] requiring further screening and/or treatment in the ED prior to admission is  present.       B Medical/Surgery History Past Medical History:  Diagnosis Date  . Anemia of chronic disease 10/26/2013  . Hernia of unspecified site of abdominal cavity without mention of obstruction or gangrene   . Hypertension   . Liver disease   . Lynch syndrome   . Lynch syndrome   . Primary biliary cirrhosis ([place]) 10/26/2013  . Thyroid disease    Past Surgical History:  Procedure Laterality Date  . ABDOMINAL HERNIA REPAIR     Patient's states that she has had 8- 9 hernia surgeries  . ABDOMINAL HYSTERECTOMY    . CHOLECYSTECTOMY  2007  . COLON RESECTION    . COLON SURGERY  2008   Done at [facility]  . COLONOSCOPY     Done at [facility]  . ESOPHAGOGASTRODUODENOSCOPY N/A 08/21/2018   Procedure: ESOPHAGOGASTRODUODENOSCOPY (EGD);  Surgeon: Rogene Houston, MD;  [facility];  Service: Endoscopy;  Laterality: N/A;  . EYE SURGERY     lasix  . FLEXIBLE SIGMOIDOSCOPY N/A 10/20/2015    Procedure: FLEXIBLE SIGMOIDOSCOPY;  Surgeon: Rogene Houston, MD;  [facility];  Service: Endoscopy;  Laterality: N/A;  58 - Dr Laural Golden has meeting until 1:00  . FLEXIBLE SIGMOIDOSCOPY N/A 07/11/2016   Procedure: FLEXIBLE SIGMOIDOSCOPY;  Surgeon: Rogene Houston, MD;  [facility];  Service: Endoscopy;  Laterality: N/A;  1200  . FLEXIBLE SIGMOIDOSCOPY N/A 08/09/2017   Procedure: FLEXIBLE SIGMOIDOSCOPY;  Surgeon: Rogene Houston, MD;  [facility];  Service: Endoscopy;  Laterality: N/A;  1:00  . FLEXIBLE SIGMOIDOSCOPY N/A 08/21/2018   Procedure: FLEXIBLE SIGMOIDOSCOPY;  Surgeon: Rogene Houston, MD;  [facility];  Service: Endoscopy;  Laterality: N/A;  . HERNIA REPAIR    . LIVER TRANSPLANT  [id number]  . multiple skin cancers removed    . POLYPECTOMY  08/09/2017   Procedure: POLYPECTOMY;  Surgeon: Rogene Houston, MD;  [facility];  Service: Endoscopy;;  colon small bowel  . REVERSE SHOULDER ARTHROPLASTY Left 07/17/2018  . REVERSE SHOULDER ARTHROPLASTY Left 07/17/2018   Procedure: LEFT REVERSE SHOULDER ARTHROPLASTY;  Surgeon: Justice Britain, MD;  Location: [place];  Service: Orthopedics;  Laterality: Left;  134min  . SHOULDER CLOSED REDUCTION Left 09/27/2019   Procedure: CLOSED REDUCTION SHOULDER;  Surgeon: Paralee Cancel, MD;  [facility];  Service: Orthopedics;  Laterality: Left;  . SPLENECTOMY  2006  . TYMPANOSTOMY TUBE PLACEMENT    .  UPPER GASTROINTESTINAL ENDOSCOPY     Done at [facility]     A IV Location/Drains/Wounds Patient Lines/Drains/Airways Status   Active Line/Drains/Airways    Name:   Placement date:   Placement time:   Site:   Days:   Peripheral IV 09/27/19 Right Forearm   09/27/19    1526    Forearm   7   Peripheral IV 10/04/19 Left Forearm   10/04/19    2006    Forearm   less than 1   Peripheral IV 10/04/19 Right Antecubital   10/04/19    2015    Antecubital   less than 1   Incision (Closed) 07/17/18 Shoulder Left    07/17/18    1328     444   Pressure Ulcer 11/02/13 Stage II -  Partial thickness loss of dermis presenting as a shallow open ulcer with a red, pink wound bed without slough. Stage 2 on HERNIA   11/02/13    1200     2162          Intake/Output Last 24 hours No intake or output data in the 24 hours ending 10/04/19 2152  Labs/Imaging Results for orders placed or performed during the hospital encounter of 10/04/19 (from the past 48 hour(s))  Lactic acid, plasma     Status: None   Collection Time: 10/04/19  8:22 PM  Result Value Ref Range   Lactic Acid, Venous 1.2 0.5 - 1.9 mmol/L    Comment: Performed at [facility], [address] [place], [place] [address]  Comprehensive metabolic panel     Status: Abnormal   Collection Time: 10/04/19  8:22 PM  Result Value Ref Range   Sodium 130 (L) 135 - 145 mmol/L   Potassium 3.1 (L) 3.5 - 5.1 mmol/L   Chloride 97 (L) 98 - 111 mmol/L   CO2 21 (L) 22 - 32 mmol/L   Glucose, Bld 109 (H) 70 - 99 mg/dL   BUN 14 8 - 23 mg/dL   Creatinine, Ser 0.83 0.44 - 1.00 mg/dL   Calcium 8.2 (L) 8.9 - 10.3 mg/dL   Total Protein 6.8 6.5 - 8.1 g/dL   Albumin 3.0 (L) 3.5 - 5.0 g/dL   AST 35 15 - 41 U/L   ALT 36 0 - 44 U/L   Alkaline Phosphatase 356 (H) 38 - 126 U/L   Total Bilirubin 0.7 0.3 - 1.2 mg/dL   GFR calc non Af Amer >60 >60 mL/min   GFR calc Af Amer >60 >60 mL/min   Anion gap 12 5 - 15    Comment: Performed at [facility], [address], [place], [place] [address]  CBC WITH DIFFERENTIAL     Status: Abnormal   Collection Time: 10/04/19  8:22 PM  Result Value Ref Range   WBC 11.8 (H) 4.0 - 10.5 K/uL   RBC 3.45 (L) 3.87 - 5.11 MIL/uL   Hemoglobin 8.5 (L) 12.0 - 15.0 g/dL   HCT 28.5 (L) 36.0 - 46.0 %   MCV 82.6 80.0 - 100.0 fL   MCH 24.6 (L) 26.0 - 34.0 pg   MCHC 29.8 (L) 30.0 - 36.0 g/dL   RDW 15.8 (H) 11.5 - 15.5 %   Platelets 368 150 - 400 K/uL   nRBC 0.0 0.0 - 0.2 %   Neutrophils Relative % 96 %   Neutro Abs 10.9 (H) 1.7 - 7.7 K/uL    Lymphocytes Relative 3 %   Lymphs Abs 0.3 (L) 0.7 - 4.0 K/uL  Monocytes Relative 0 %   Monocytes Absolute 0.0 (L) 0.1 - 1.0 K/uL   Eosinophils Relative 0 %   Eosinophils Absolute 0.0 0.0 - 0.5 K/uL   Basophils Relative 1 %   Basophils Absolute 0.1 0.0 - 0.1 K/uL   Immature Granulocytes 0 %   Abs Immature Granulocytes 0.05 0.00 - 0.07 K/uL    Comment: Performed at [facility], [address] [place], [place] [address]  APTT     Status: None   Collection Time: 10/04/19  8:22 PM  Result Value Ref Range   aPTT 33 24 - 36 seconds    Comment: Performed at [facility], [address], [place], [place] [address]  Protime-INR     Status: None   Collection Time: 10/04/19  8:22 PM  Result Value Ref Range   Prothrombin Time 13.4 11.4 - 15.2 seconds   INR 1.0 0.8 - 1.2    Comment: (NOTE) INR goal varies based on device and disease states. Performed at [facility], [address], [place], [place] [address]   Blood Culture (routine x 2)     Status: None (Preliminary result)   Collection Time: 10/04/19  8:22 PM   Specimen: BLOOD LEFT FOREARM  Result Value Ref Range   Specimen Description BLOOD LEFT FOREARM    Special Requests      BOTTLES DRAWN AEROBIC AND ANAEROBIC Blood Culture adequate volume Performed at [facility], [address], [place], [place] [address]    Culture PENDING    Report Status PENDING   Blood Culture (routine x 2)     Status: None (Preliminary result)   Collection Time: 10/04/19  8:30 PM   Specimen: Right Antecubital; Blood  Result Value Ref Range   Specimen Description RIGHT ANTECUBITAL    Special Requests      BOTTLES DRAWN AEROBIC AND ANAEROBIC Blood Culture adequate volume Performed at [facility], [address] [place], [place] [address]    Culture PENDING    Report Status PENDING    Ct Abdomen Pelvis W Contrast  Result Date: 10/04/2019 CLINICAL DATA:  Abdominal pain and fevers for 2 days EXAM: CT ABDOMEN AND PELVIS WITH CONTRAST TECHNIQUE:  Multidetector CT imaging of the abdomen and pelvis was performed using the standard protocol following bolus administration of intravenous contrast. CONTRAST:  141mL OMNIPAQUE IOHEXOL 300 MG/ML  SOLN COMPARISON:  None. FINDINGS: Lower chest: Mild bibasilar atelectasis is noted. No focal confluent infiltrate is seen. Hepatobiliary: Gallbladder has been surgically removed. There are changes consistent with the given clinical history of liver transplant. Mild fullness of the biliary tree is noted. Biliary stent is noted in place. Pancreas: Atrophic but stable Spleen: Prior splenectomy. Stable splenule is noted in the splenic bed. Adrenals/Urinary Tract: Adrenal glands are within normal limits. Kidneys are well visualized bilaterally. No renal calculi or urinary tract obstructive changes are seen. Small 1 cm cyst is noted within the left kidney stable from the previous exam. The bladder is well distended. Stomach/Bowel: Postsurgical changes are noted with subtotal colectomy. The anastomosis appears patent. Few mildly dilated loops of small bowel are noted in the right mid abdomen without definitive transition zone. Changes may be related to underlying adhesions and partial small bowel obstruction. Stomach appears within normal limits. Vascular/Lymphatic: Atherosclerotic calcifications of the abdominal aorta are noted. No aneurysmal dilatation is seen. No significant lymphadenopathy is noted. Reproductive: Status post hysterectomy. No adnexal masses. Other: No abdominal wall hernia or abnormality. No abdominopelvic ascites. Musculoskeletal: Degenerative changes of lumbar spine  are noted. No acute abnormality is seen. Stable pars defects at L5 are noted with anterolisthesis of L5 on S1. IMPRESSION: Changes consistent with prior hepatic transplant. Minimal fullness of the biliary tree is noted within the liver. A biliary stent is noted in place. Mildly dilated small bowel loops within the right mid abdomen without  definitive transition zone. These changes likely reflect a partial small bowel obstruction related to postoperative adhesions. Postsurgical changes as described above Electronically Signed   By: Inez Catalina M.D.   On: 10/04/2019 21:28   Dg Chest Port 1 View  Result Date: 10/04/2019 CLINICAL DATA:  Pt c/o of fever since yesterday and lower back pain. Tmax 104.1 at home. Pt has taken tylenol, hydrocodone and a muscle relaxer. Pt says she was tested for COVID 3 days in a row last weekend and all were negative. EXAM: PORTABLE CHEST 1 VIEW COMPARISON:  07/01/2019 FINDINGS: Cardiac silhouette is normal in size. No mediastinal or hilar masses. Lungs are clear.  No pleural effusion or pneumothorax. Partly imaged left shoulder prosthesis. IMPRESSION: No acute cardiopulmonary disease. Electronically Signed   By: Lajean Manes M.D.   On: 10/04/2019 19:59    Pending Labs Unresulted Labs (From admission, onward)    Start     Ordered   10/04/19 2222  Lactic acid, plasma  Once,   STAT     10/04/19 2222   10/04/19 1949  SARS CORONAVIRUS 2 (TAT 6-24 HRS) Nasopharyngeal Nasopharyngeal Swab  (Asymptomatic/Tier 3)  Once,   STAT    Question Answer Comment  Is this test for diagnosis or screening Screening   Symptomatic for COVID-19 as defined by CDC No   Hospitalized for COVID-19 No   Admitted to ICU for COVID-19 No   Previously tested for COVID-19 Yes   Resident in a congregate (group) care setting Unknown   Employed in healthcare setting Unknown   Pregnant No      10/04/19 1949   10/04/19 1947  Urinalysis, Routine w reflex microscopic  ONCE - STAT,   STAT     10/04/19 1947   10/04/19 1947  Urine culture  ONCE - STAT,   STAT     10/04/19 1947          Vitals/Pain Today's Vitals   10/04/19 2100 10/04/19 2101 10/04/19 2115 10/04/19 2128  BP: 111/77   104/63  Pulse: 87 88 93 92  Resp: 20 (!) 22 18 16   Temp:    98.8 F (37.1 C)  TempSrc:    Oral  SpO2: 95% 96% 95% 97%  Weight:      Height:       PainSc:        Isolation Precautions No active isolations  Medications Medications  sodium chloride 0.9 % bolus 1,000 mL (0 mLs Intravenous Stopped 10/04/19 2116)  piperacillin-tazobactam (ZOSYN) IVPB 3.375 g (0 g Intravenous Stopped 10/04/19 2116)  iohexol (OMNIPAQUE) 300 MG/ML solution 100 mL (100 mLs Intravenous Contrast Given 10/04/19 2105)    Mobility walks Low fall risk   Focused Assessments    R Recommendations: See Admitting Provider Note  Report given to:   Additional Notes:

## 2019-10-04 NOTE — ED Provider Notes (Addendum)
[facility] Emergency Department Provider Note MRN:  [id number]  Arrival date & time: 10/04/19     Chief Complaint   Fever   History of Present Illness   Cassidy Barber is a 68 y.o. year-old female with a history of Lynch syndrome, colon cancer, primary biliary cirrhosis, liver transplant presenting to the ED with chief complaint of fever.  Patient was out in the yard pulling some weeds when she suddenly felt unwell, felt as if she had a fever.  Went inside and measured her fever to be 104.1, was having full body shaking chills.  Was experiencing some diffuse abdominal pain as well.  Had some pain to the lower back but she thought this was more related to muscle pains from her outdoor activities and it improved with hydrocodone.  She denies numbness or weakness to the arms or legs.  She reports less urine production today.  Denies chest pain or shortness of breath.  Review of Systems  A complete 10 system review of systems was obtained and all systems are negative except as noted in the HPI and PMH.   Patient's Health History    Past Medical History:  Diagnosis Date  . Anemia of chronic disease 10/26/2013  . Hernia of unspecified site of abdominal cavity without mention of obstruction or gangrene   . Hypertension   . Liver disease   . Lynch syndrome   . Lynch syndrome   . Primary biliary cirrhosis ([place]) 10/26/2013  . Thyroid disease     Past Surgical History:  Procedure Laterality Date  . ABDOMINAL HERNIA REPAIR     Patient's states that she has had 8- 9 hernia surgeries  . ABDOMINAL HYSTERECTOMY    . CHOLECYSTECTOMY  2007  . COLON RESECTION    . COLON SURGERY  2008   [facility]  . COLONOSCOPY     Done at [facility]  . ESOPHAGOGASTRODUODENOSCOPY N/A 08/21/2018   Procedure: ESOPHAGOGASTRODUODENOSCOPY (EGD);  Surgeon: Rogene Houston, MD;  [facility];  Service: Endoscopy;  Laterality: N/A;  . EYE SURGERY     lasix  . FLEXIBLE SIGMOIDOSCOPY  N/A 10/20/2015   Procedure: FLEXIBLE SIGMOIDOSCOPY;  Surgeon: Rogene Houston, MD;  [facility];  Service: Endoscopy;  Laterality: N/A;  41 - Dr Laural Golden has meeting until 1:00  . FLEXIBLE SIGMOIDOSCOPY N/A 07/11/2016   Procedure: FLEXIBLE SIGMOIDOSCOPY;  Surgeon: Rogene Houston, MD;  [facility];  Service: Endoscopy;  Laterality: N/A;  1200  . FLEXIBLE SIGMOIDOSCOPY N/A 08/09/2017   Procedure: FLEXIBLE SIGMOIDOSCOPY;  Surgeon: Rogene Houston, MD;  [facility];  Service: Endoscopy;  Laterality: N/A;  1:00  . FLEXIBLE SIGMOIDOSCOPY N/A 08/21/2018   Procedure: FLEXIBLE SIGMOIDOSCOPY;  Surgeon: Rogene Houston, MD;  [facility];  Service: Endoscopy;  Laterality: N/A;  . HERNIA REPAIR    . LIVER TRANSPLANT  [id number]  . multiple skin cancers removed    . POLYPECTOMY  08/09/2017   Procedure: POLYPECTOMY;  Surgeon: Rogene Houston, MD;  [facility];  Service: Endoscopy;;  colon small bowel  . REVERSE SHOULDER ARTHROPLASTY Left 07/17/2018  . REVERSE SHOULDER ARTHROPLASTY Left 07/17/2018   Procedure: LEFT REVERSE SHOULDER ARTHROPLASTY;  Surgeon: Justice Britain, MD;  Location: [place];  Service: Orthopedics;  Laterality: Left;  167min  . SHOULDER CLOSED REDUCTION Left 09/27/2019   Procedure: CLOSED REDUCTION SHOULDER;  Surgeon: Paralee Cancel, MD;  [facility];  Service: Orthopedics;  Laterality: Left;  . SPLENECTOMY  2006  . TYMPANOSTOMY TUBE PLACEMENT    . UPPER GASTROINTESTINAL ENDOSCOPY     Done at [facility]    Family History  Problem Relation Age of Onset  . Prostate cancer Father   . Colon cancer Father   . Colon cancer Sister   . Lung cancer Sister   . Healthy Son   . Alcohol abuse Brother   . Allergic rhinitis Neg Hx   . Asthma Neg Hx   . Eczema Neg Hx   . Urticaria Neg Hx     Social History   Socioeconomic History  . Marital status: Married    Spouse name: Charlotte Crumb  . Number of children: 1  . Years of education: 64  .  Highest education level: High school graduate  Occupational History  . Occupation: Disability    Employer: [employer]    Comment: Multimedia programmer  Social Needs  . Financial resource strain: Not hard at all  . Food insecurity    Worry: Never true    Inability: Never true  . Transportation needs    Medical: No    Non-medical: No  Tobacco Use  . Smoking status: Never Smoker  . Smokeless tobacco: Never Used  Substance and Sexual Activity  . Alcohol use: No    Alcohol/week: 0.0 standard drinks  . Drug use: No  . Sexual activity: Yes    Birth control/protection: None  Lifestyle  . Physical activity    Days per week: 0 days    Minutes per session: 0 min  . Stress: Not at all  Relationships  . Social connections    Talks on phone: More than three times a week    Gets together: More than three times a week    Attends religious service: More than 4 times per year    Active member of club or organization: Yes    Attends meetings of clubs or organizations: More than 4 times per year    Relationship status: Married  . Intimate partner violence    Fear of current or ex partner: No    Emotionally abused: No    Physically abused: No    Forced sexual activity: No  Other Topics Concern  . Not on file  Social History Narrative   Patient lives in a two story home with her husband. She has an adult son. She is retired from being an Web designer for 30 years.      Physical Exam  Vital Signs and Nursing Notes reviewed Vitals:   10/04/19 2115 10/04/19 2128  BP:  104/63  Pulse: 93 92  Resp: 18 16  Temp:  98.8 F (37.1 C)  SpO2: 95% 97%    CONSTITUTIONAL: Well-appearing, NAD NEURO:  Alert and oriented x 3, no focal deficits EYES:  eyes equal and reactive ENT/NECK:  no LAD, no JVD CARDIO: Regular rate, well-perfused, normal S1 and S2 PULM:  CTAB no wheezing or rhonchi GI/GU:  normal bowel sounds, non-distended, mild diffuse tenderness MSK/SPINE:  No gross  deformities, no edema SKIN:  no rash, atraumatic PSYCH:  Appropriate speech and behavior  Diagnostic and Interventional Summary    EKG Interpretation  Date/Time:    Ventricular Rate:    PR Interval:    QRS Duration:   QT Interval:    QTC Calculation:   R Axis:     Text Interpretation:        Labs Reviewed  COMPREHENSIVE METABOLIC PANEL - Abnormal; Notable for  the following components:      Result Value   Sodium 130 (*)    Potassium 3.1 (*)    Chloride 97 (*)    CO2 21 (*)    Glucose, Bld 109 (*)    Calcium 8.2 (*)    Albumin 3.0 (*)    Alkaline Phosphatase 356 (*)    All other components within normal limits  CBC WITH DIFFERENTIAL/PLATELET - Abnormal; Notable for the following components:   WBC 11.8 (*)    RBC 3.45 (*)    Hemoglobin 8.5 (*)    HCT 28.5 (*)    MCH 24.6 (*)    MCHC 29.8 (*)    RDW 15.8 (*)    Neutro Abs 10.9 (*)    Lymphs Abs 0.3 (*)    Monocytes Absolute 0.0 (*)    All other components within normal limits  CULTURE, BLOOD (ROUTINE X 2)  CULTURE, BLOOD (ROUTINE X 2)  URINE CULTURE  SARS CORONAVIRUS 2 (TAT 6-24 HRS)  LACTIC ACID, PLASMA  APTT  PROTIME-INR  URINALYSIS, ROUTINE W REFLEX MICROSCOPIC  LACTIC ACID, PLASMA    CT Abdomen Pelvis W Contrast  Final Result    DG Chest Port 1 View  Final Result      Medications  sodium chloride 0.9 % bolus 1,000 mL (0 mLs Intravenous Stopped 10/04/19 2116)  piperacillin-tazobactam (ZOSYN) IVPB 3.375 g (0 g Intravenous Stopped 10/04/19 2116)  iohexol (OMNIPAQUE) 300 MG/ML solution 100 mL (100 mLs Intravenous Contrast Given 10/04/19 2105)     Procedures  /  Critical Care .Critical Care Performed by: Maudie Flakes, MD Authorized by: Maudie Flakes, MD   Critical care provider statement:    Critical care time (minutes):  35   Critical care was necessary to treat or prevent imminent or life-threatening deterioration of the following conditions:  Sepsis   Critical care was time spent personally  by me on the following activities:  Discussions with consultants, evaluation of patient's response to treatment, examination of patient, ordering and performing treatments and interventions, ordering and review of laboratory studies, ordering and review of radiographic studies, pulse oximetry, re-evaluation of patient's condition, obtaining history from patient or surrogate and review of old charts    ED Course and Medical Decision Making  I have reviewed the triage vital signs and the nursing notes.  Pertinent labs & imaging results that were available during my care of the patient were reviewed by me and considered in my medical decision making (see below for details).     Immunocompromised, fever, abdominal pain, code sepsis initiated, providing Zosyn, obtaining CT imaging of the abdomen, anticipating admission.  Labs reveal hyponatremia.  CT reveals partial small bowel obstruction.  Given these findings and patient's fever and rigors there is concern for translocation causing transient bacteremia.  Given her immunocompromise state, this warrants admission.  Patient's transplant was performed at [facility] and she is followed there.  However patient prefers to stay here at [place].  Will discuss with general surgeon and admit to hospitalist service.  12:05 AM update: Discussed with Dr. Constance Haw of general surgery as well as the hospitalist team.  In summary, given the chance of partial small bowel obstruction becoming complete bowel obstruction, patient needs to be transferred to [facility] for this care.  Patient is accepting of this plan.  Awaiting callback from [place] transplant team for acceptance.  Signed out to oncoming provider at shift change.  Barth Kirks. [place] Small, [place]  [place] [email or web address]  Final Clinical Impressions(s) / ED Diagnoses     ICD-10-CM   1. Rigors  R68.89   2. Immunocompromised ([place])  D84.9   3. Partial small bowel  obstruction ([place])  K56.600     ED Discharge Orders    None       Discharge Instructions Discussed with and Provided to Patient:   Discharge Instructions   None       Maudie Flakes, MD 10/04/19 2146    Maudie Flakes, MD 10/05/19 0006

## 2019-10-05 ENCOUNTER — Ambulatory Visit (INDEPENDENT_AMBULATORY_CARE_PROVIDER_SITE_OTHER): Payer: Medicare Other

## 2019-10-05 ENCOUNTER — Telehealth (INDEPENDENT_AMBULATORY_CARE_PROVIDER_SITE_OTHER): Payer: Self-pay | Admitting: *Deleted

## 2019-10-05 ENCOUNTER — Ambulatory Visit: Payer: Medicare Other | Admitting: Cardiology

## 2019-10-05 DIAGNOSIS — Z9049 Acquired absence of other specified parts of digestive tract: Secondary | ICD-10-CM | POA: Diagnosis not present

## 2019-10-05 DIAGNOSIS — Z85038 Personal history of other malignant neoplasm of large intestine: Secondary | ICD-10-CM | POA: Diagnosis not present

## 2019-10-05 DIAGNOSIS — Z9689 Presence of other specified functional implants: Secondary | ICD-10-CM | POA: Diagnosis not present

## 2019-10-05 DIAGNOSIS — Z9079 Acquired absence of other genital organ(s): Secondary | ICD-10-CM | POA: Diagnosis not present

## 2019-10-05 DIAGNOSIS — R6889 Other general symptoms and signs: Secondary | ICD-10-CM | POA: Diagnosis not present

## 2019-10-05 DIAGNOSIS — Z90710 Acquired absence of both cervix and uterus: Secondary | ICD-10-CM | POA: Diagnosis not present

## 2019-10-05 DIAGNOSIS — K8309 Other cholangitis: Secondary | ICD-10-CM | POA: Diagnosis not present

## 2019-10-05 DIAGNOSIS — D509 Iron deficiency anemia, unspecified: Secondary | ICD-10-CM | POA: Diagnosis not present

## 2019-10-05 DIAGNOSIS — A419 Sepsis, unspecified organism: Secondary | ICD-10-CM | POA: Diagnosis not present

## 2019-10-05 DIAGNOSIS — K5989 Other specified functional intestinal disorders: Secondary | ICD-10-CM | POA: Diagnosis not present

## 2019-10-05 DIAGNOSIS — Z4659 Encounter for fitting and adjustment of other gastrointestinal appliance and device: Secondary | ICD-10-CM | POA: Diagnosis not present

## 2019-10-05 DIAGNOSIS — Z90722 Acquired absence of ovaries, bilateral: Secondary | ICD-10-CM | POA: Diagnosis not present

## 2019-10-05 DIAGNOSIS — K743 Primary biliary cirrhosis: Secondary | ICD-10-CM | POA: Diagnosis not present

## 2019-10-05 DIAGNOSIS — R748 Abnormal levels of other serum enzymes: Secondary | ICD-10-CM | POA: Diagnosis not present

## 2019-10-05 DIAGNOSIS — K56600 Partial intestinal obstruction, unspecified as to cause: Secondary | ICD-10-CM | POA: Diagnosis not present

## 2019-10-05 DIAGNOSIS — N179 Acute kidney failure, unspecified: Secondary | ICD-10-CM | POA: Diagnosis not present

## 2019-10-05 DIAGNOSIS — T8649 Other complications of liver transplant: Secondary | ICD-10-CM | POA: Diagnosis not present

## 2019-10-05 DIAGNOSIS — K56609 Unspecified intestinal obstruction, unspecified as to partial versus complete obstruction: Secondary | ICD-10-CM | POA: Diagnosis not present

## 2019-10-05 DIAGNOSIS — R6520 Severe sepsis without septic shock: Secondary | ICD-10-CM | POA: Diagnosis not present

## 2019-10-05 DIAGNOSIS — D849 Immunodeficiency, unspecified: Secondary | ICD-10-CM | POA: Diagnosis not present

## 2019-10-05 DIAGNOSIS — I10 Essential (primary) hypertension: Secondary | ICD-10-CM | POA: Diagnosis not present

## 2019-10-05 DIAGNOSIS — E871 Hypo-osmolality and hyponatremia: Secondary | ICD-10-CM | POA: Diagnosis not present

## 2019-10-05 DIAGNOSIS — Z886 Allergy status to analgesic agent status: Secondary | ICD-10-CM | POA: Diagnosis not present

## 2019-10-05 DIAGNOSIS — Z20828 Contact with and (suspected) exposure to other viral communicable diseases: Secondary | ICD-10-CM | POA: Diagnosis not present

## 2019-10-05 DIAGNOSIS — Z881 Allergy status to other antibiotic agents status: Secondary | ICD-10-CM | POA: Diagnosis not present

## 2019-10-05 DIAGNOSIS — Z944 Liver transplant status: Secondary | ICD-10-CM | POA: Diagnosis not present

## 2019-10-05 DIAGNOSIS — K831 Obstruction of bile duct: Secondary | ICD-10-CM | POA: Diagnosis not present

## 2019-10-05 DIAGNOSIS — Z1509 Genetic susceptibility to other malignant neoplasm: Secondary | ICD-10-CM | POA: Diagnosis not present

## 2019-10-05 LAB — SARS CORONAVIRUS 2 (TAT 6-24 HRS): SARS Coronavirus 2: NEGATIVE

## 2019-10-05 MED ORDER — ACETAMINOPHEN 325 MG PO TABS
650.0000 mg | ORAL_TABLET | Freq: Once | ORAL | Status: AC
  Administered 2019-10-05: 03:00:00 650 mg via ORAL
  Filled 2019-10-05: qty 2

## 2019-10-05 NOTE — Telephone Encounter (Signed)
Referring MD/PCP: christy hawks   Procedure:  flex sig/egd  Reason/Indication:  Hx colon ca, hx polyps, lynch syndrome, chest pain  Has patient had this procedure before?  Yes, 2019 (both)  If so, when, by whom and where?    Is there a family history of colon cancer?  no  Who?  What age when diagnosed?    Is patient diabetic?   no      Does patient have prosthetic heart valve or mechanical valve?  no  Do you have a pacemaker/defibrillator?  no  Has patient ever had endocarditis/atrial fibrillation? no  Does patient use oxygen? no  Has patient had joint replacement within last 12 months?  no  Is patient constipated or do they take laxatives? no  Does patient have a history of alcohol/drug use?  no  Is patient on blood thinner such as Coumadin, Plavix and/or Aspirin? yes  Medications: asa 81 mg daily, alendronate 70 mg once a week, alprazolam 0.5 mg prn, biotin daily, calcium daily, zyrtec 40 mg daily, famotidine 20 mg bid, hydroxyzine 25 mg prn, levothyroxine 125 mcg daily, imodium prn, losartan 25 mg daily, magnesium daily, metoprolol 25 mg daily, singulair daily, pantoprazole 40 mg daily, ursodiol 250 mg, vit b12 daily, vit d 50,000 units once a week, vit d3 75 mcg daily  Allergies: Cipro, codeine   Medication Adjustment per Dr Laural Golden: asa 2 days  Procedure date & time: 11/04/19 at 1030

## 2019-10-07 LAB — URINE CULTURE: Culture: 20000 — AB

## 2019-10-07 MED ORDER — Medication
3.38 | Status: DC
Start: 2019-10-07 — End: 2019-10-07

## 2019-10-07 MED ORDER — GENERIC EXTERNAL MEDICATION
1000.00 | Status: DC
Start: 2019-10-08 — End: 2019-10-07

## 2019-10-07 MED ORDER — GENERIC EXTERNAL MEDICATION
1.00 | Status: DC
Start: 2019-10-07 — End: 2019-10-07

## 2019-10-07 MED ORDER — LACTATED RINGERS IV SOLN
INTRAVENOUS | Status: DC
Start: ? — End: 2019-10-07

## 2019-10-07 MED ORDER — MELATONIN 3 MG PO TABS
3.00 | ORAL_TABLET | ORAL | Status: DC
Start: ? — End: 2019-10-07

## 2019-10-07 MED ORDER — ASPIRIN 81 MG PO CHEW
81.00 | CHEWABLE_TABLET | ORAL | Status: DC
Start: 2019-10-08 — End: 2019-10-07

## 2019-10-07 MED ORDER — CYCLOBENZAPRINE HCL 10 MG PO TABS
5.00 | ORAL_TABLET | ORAL | Status: DC
Start: ? — End: 2019-10-07

## 2019-10-07 MED ORDER — ALPRAZOLAM 0.5 MG PO TABS
0.50 | ORAL_TABLET | ORAL | Status: DC
Start: ? — End: 2019-10-07

## 2019-10-07 MED ORDER — EVEROLIMUS 0.5 MG PO TABS
2.00 | ORAL_TABLET | ORAL | Status: DC
Start: 2019-10-07 — End: 2019-10-07

## 2019-10-07 MED ORDER — FAMOTIDINE 20 MG PO TABS
20.00 | ORAL_TABLET | ORAL | Status: DC
Start: 2019-10-07 — End: 2019-10-07

## 2019-10-07 MED ORDER — BAYER WOMENS 81-300 MG PO TABS
40.00 | ORAL_TABLET | ORAL | Status: DC
Start: 2019-10-08 — End: 2019-10-07

## 2019-10-07 MED ORDER — CHOLECALCIFEROL 25 MCG (1000 UT) PO TABS
1000.00 | ORAL_TABLET | ORAL | Status: DC
Start: 2019-10-08 — End: 2019-10-07

## 2019-10-07 MED ORDER — VITRUM SENIOR PO TABS
1.00 | ORAL_TABLET | ORAL | Status: DC
Start: 2019-10-08 — End: 2019-10-07

## 2019-10-07 MED ORDER — LIDOCAINE HCL 1 % IJ SOLN
0.50 | INTRAMUSCULAR | Status: DC
Start: ? — End: 2019-10-07

## 2019-10-07 MED ORDER — GENERIC EXTERNAL MEDICATION
Status: DC
Start: ? — End: 2019-10-07

## 2019-10-07 MED ORDER — EUCERIN EX CREA
TOPICAL_CREAM | CUTANEOUS | Status: DC
Start: ? — End: 2019-10-07

## 2019-10-07 MED ORDER — CULTURELLE PO CAPS
1.00 | ORAL_CAPSULE | ORAL | Status: DC
Start: 2019-10-08 — End: 2019-10-07

## 2019-10-07 MED ORDER — ACETAMINOPHEN 325 MG PO TABS
650.00 | ORAL_TABLET | ORAL | Status: DC
Start: ? — End: 2019-10-07

## 2019-10-07 MED ORDER — FEXOFENADINE HCL 180 MG PO TABS
180.00 | ORAL_TABLET | ORAL | Status: DC
Start: 2019-10-08 — End: 2019-10-07

## 2019-10-08 ENCOUNTER — Telehealth: Payer: Self-pay | Admitting: Emergency Medicine

## 2019-10-09 LAB — CULTURE, BLOOD (ROUTINE X 2)
Culture: NO GROWTH
Culture: NO GROWTH
Special Requests: ADEQUATE
Special Requests: ADEQUATE

## 2019-10-13 ENCOUNTER — Telehealth (INDEPENDENT_AMBULATORY_CARE_PROVIDER_SITE_OTHER): Payer: Self-pay | Admitting: *Deleted

## 2019-10-13 ENCOUNTER — Encounter (INDEPENDENT_AMBULATORY_CARE_PROVIDER_SITE_OTHER): Payer: Self-pay | Admitting: *Deleted

## 2019-10-13 DIAGNOSIS — Z85038 Personal history of other malignant neoplasm of large intestine: Secondary | ICD-10-CM

## 2019-10-13 MED ORDER — PEG 3350-KCL-NA BICARB-NACL 420 G PO SOLR: 4000.0000 mL | Freq: Once | ORAL | 0 refills | Status: AC

## 2019-10-13 NOTE — Telephone Encounter (Signed)
Patient needs trilyte Flex sig sch'd 12/23

## 2019-10-14 ENCOUNTER — Other Ambulatory Visit: Payer: Self-pay | Admitting: Family

## 2019-10-14 MED ORDER — LEVOTHYROXINE SODIUM 125 MCG PO TABS: 125.0000 ug | ORAL_TABLET | Freq: Every day | ORAL | 0 refills | Status: DC

## 2019-10-19 ENCOUNTER — Encounter: Payer: Self-pay | Admitting: Family

## 2019-10-19 ENCOUNTER — Other Ambulatory Visit: Payer: Self-pay

## 2019-10-19 ENCOUNTER — Ambulatory Visit (INDEPENDENT_AMBULATORY_CARE_PROVIDER_SITE_OTHER): Payer: Medicare Other | Admitting: Family

## 2019-10-19 ENCOUNTER — Other Ambulatory Visit: Payer: Self-pay | Admitting: Family

## 2019-10-19 ENCOUNTER — Ambulatory Visit (INDEPENDENT_AMBULATORY_CARE_PROVIDER_SITE_OTHER): Payer: Medicare Other

## 2019-10-19 VITALS — BP 123/73 | HR 94 | Temp 97.8°F | Ht 64.0 in | Wt 140.0 lb

## 2019-10-19 DIAGNOSIS — F1320 Sedative, hypnotic or anxiolytic dependence, uncomplicated: Secondary | ICD-10-CM

## 2019-10-19 DIAGNOSIS — F411 Generalized anxiety disorder: Secondary | ICD-10-CM

## 2019-10-19 DIAGNOSIS — S3992XA Unspecified injury of lower back, initial encounter: Secondary | ICD-10-CM | POA: Diagnosis not present

## 2019-10-19 DIAGNOSIS — Z79899 Other long term (current) drug therapy: Secondary | ICD-10-CM

## 2019-10-19 DIAGNOSIS — M533 Sacrococcygeal disorders, not elsewhere classified: Secondary | ICD-10-CM | POA: Diagnosis not present

## 2019-10-19 MED ORDER — ALPRAZOLAM 0.5 MG PO TABS: 0.5000 mg | ORAL_TABLET | Freq: Every day | ORAL | 1 refills | Status: DC

## 2019-10-19 MED ORDER — HYDROCODONE-ACETAMINOPHEN 5-325 MG PO TABS: 1.0000 | ORAL_TABLET | Freq: Four times a day (QID) | ORAL | 0 refills | Status: DC | PRN

## 2019-10-19 NOTE — Patient Instructions (Signed)
Tailbone Injury  The tailbone (coccyx) is the small bone at the lower end of the spine. A tailbone injury may involve stretched ligaments, bruising, or a broken bone (fracture). Tailbone injuries can be painful, and some may take a long time to heal. What are the causes? This condition may be caused by:  Falling and landing on the tailbone.  Repeated strain or friction from sitting for long periods of time. This may include actions such as rowing and bicycling.  Childbirth. In some cases, the cause may not be known. What are the signs or symptoms? Symptoms of this condition include:  Pain in the tailbone area or lower back, especially when sitting.  Pain or difficulty when standing up from a sitting position.  Bruising or swelling in the tailbone area.  Painful bowel movements.  In women, pain during intercourse. How is this diagnosed? This condition may be diagnosed based on:  Your symptoms.  A physical exam. If your health care provider suspects a fracture, you may have additional tests, such as:  X-rays.  CT scan.  MRI. How is this treated? Most tailbone injuries heal on their own in 4-6 weeks. However, recovery time may be longer if the injury involves a fracture. Treatment for this condition may include:  NSAIDs or other over-the-counter medicines to help relieve your pain.  Using a large, rubber or inflated ring or cushion to take pressure off the tailbone when sitting.  Physical therapy.  Injecting the tailbone area with local anesthesia and steroid medicine. This is not normally needed unless the pain does not improve over time with over-the-counter pain medicines. Follow these instructions at home: Activity  Avoid sitting for long periods of time.  To prevent repeating an injury that is caused by strain or friction: ? Wear appropriate padding and sports gear when bicycling and rowing.  Increase your activity as the pain allows. Perform any exercises  that are recommended by your health care provider or physical therapist. Managing pain, stiffness, and swelling  To help decrease discomfort when sitting: ? Sit on your rubber or inflated ring or cushion as told by your health care provider. ? Lean forward when you sit.  If directed, apply ice to the injured area: ? Put ice in a plastic bag. ? Place a towel between your skin and the bag. ? Leave the ice on for 20 minutes, 2-3 times per day for the first 1-2 days.  If directed, apply heat to the affected area as often as told by your health care provider. Use the heat source that your health care provider recommends, such as a moist heat pack or a heating pad. ? Place a towel between your skin and the heat source. ? Leave the heat on for 20-30 minutes. ? Remove the heat if your skin turns bright red. This is especially important if you are unable to feel pain, heat, or cold. You may have a greater risk of getting burned. General instructions  Take over-the-counter and prescription medicines only as told by your health care provider.  To prevent or treat constipation or painful bowel movements, your health care provider may recommend that you: ? Drink enough fluid to keep your urine pale yellow. ? Eat foods that are high in fiber, such as fresh fruits and vegetables, whole grains, and beans. ? Limit foods that are high in fat and processed sugars, such as fried and sweet foods. ? Take an over-the-counter or prescription medicine for constipation.  Keep all follow-up visits as   directed by your health care provider. This is important. Contact a health care provider if:  Your pain becomes worse or is not controlled with medicine.  Your bowel movements cause a great deal of discomfort.  You are unable to have a bowel movement after 4 days.  You have pain during intercourse. Summary  A tailbone injury may involve stretched ligaments, bruising, or a broken bone (fracture).  Tailbone  injuries can be painful. Most heal on their own in 4-6 weeks.  Treatment may include taking NSAIDs, using a rubber or inflated ring or cushion when sitting, and physical therapy.  Follow any recommendations from your health care provider to prevent or treat constipation. This information is not intended to replace advice given to you by your health care provider. Make sure you discuss any questions you have with your health care provider. Document Released: 10/26/2000 Document Revised: 11/26/2017 Document Reviewed: 11/26/2017 Elsevier Patient Education  2020 [employer].

## 2019-10-19 NOTE — Telephone Encounter (Signed)
EGD and colonoscopy with conscious sedation

## 2019-10-19 NOTE — Progress Notes (Signed)
Subjective:    Patient ID: Cassidy Barber, female    DOB: 1951/09/28, 68 y.o.   MRN: [id number]  Chief Complaint  Patient presents with  . fell and hurt sacrum   Pt presents to the office today for pain in her "tail bone" after she fell Wednesday. She was standing on a chair getting on the top of the fridge and her foot slipped and landed on her butt.  Fall The accident occurred 5 to 7 days ago. There was no blood loss. The pain is present in the buttocks. The pain is at a severity of 10/10. The pain is mild.  Back Pain  Anxiety Presents for follow-up visit. Symptoms include depressed mood, excessive worry, irritability, nervous/anxious behavior and restlessness. Symptoms occur occasionally. The severity of symptoms is moderate. The quality of sleep is good.        Review of Systems  Constitutional: Positive for irritability.  Musculoskeletal: Positive for back pain.  Psychiatric/Behavioral: The patient is nervous/anxious.   All other systems reviewed and are negative.      Objective:   Physical Exam Vitals signs reviewed.  Constitutional:      General: She is not in acute distress.    Appearance: She is well-developed.  HENT:     Head: Normocephalic and atraumatic.  Eyes:     Pupils: Pupils are equal, round, and reactive to light.  Neck:     Musculoskeletal: Normal range of motion and neck supple.     Thyroid: No thyromegaly.  Cardiovascular:     Rate and Rhythm: Normal rate and regular rhythm.     Heart sounds: Normal heart sounds. No murmur.  Pulmonary:     Effort: Pulmonary effort is normal. No respiratory distress.     Breath sounds: Normal breath sounds. No wheezing.  Abdominal:     General: Bowel sounds are normal. There is no distension.     Palpations: Abdomen is soft.     Tenderness: There is no abdominal tenderness.  Musculoskeletal: Normal range of motion.        General: No tenderness.  Skin:    General: Skin is warm and dry.  Neurological:   Mental Status: She is alert and oriented to person, place, and time.     Cranial Nerves: No cranial nerve deficit.     Deep Tendon Reflexes: Reflexes are normal and symmetric.  Psychiatric:        Behavior: Behavior normal.        Thought Content: Thought content normal.        Judgment: Judgment normal.       BP 123/73   Pulse 94   Temp 97.8 F (36.6 C) (Temporal)   Ht 5\' 4"  (1.626 m)   Wt 140 lb (63.5 kg)   SpO2 99%   BMI 24.03 kg/m      Assessment & Plan:  BULA JINKS comes in today with chief complaint of fell and hurt sacrum   Diagnosis and orders addressed:  1. Sacral pain - DG Sacrum/Coccyx; Future - HYDROcodone-acetaminophen (NORCO/VICODIN) 5-325 MG tablet; Take 1 tablet by mouth every 6 (six) hours as needed.  Dispense: 14 tablet; Refill: 0  2. GAD (generalized anxiety disorder) Will refill xanax today Drug screen and urine drug [place] controlled database reviewed - ALPRAZolam (XANAX) 0.5 MG tablet; Take 1 tablet (0.5 mg total) by mouth at bedtime.  Dispense: 90 tablet; Refill: 1  3. Controlled substance agreement signed - ALPRAZolam (XANAX) 0.5 MG tablet; Take  1 tablet (0.5 mg total) by mouth at bedtime.  Dispense: 90 tablet; Refill: 1  4. Benzodiazepine dependence (HCC) - ALPRAZolam (XANAX) 0.5 MG tablet; Take 1 tablet (0.5 mg total) by mouth at bedtime.  Dispense: 90 tablet; Refill: 1  5. Coccyx pain    [name], FNP

## 2019-10-19 NOTE — Telephone Encounter (Signed)
Appt 10/19/19

## 2019-10-21 ENCOUNTER — Other Ambulatory Visit (HOSPITAL_COMMUNITY): Payer: Self-pay | Admitting: *Deleted

## 2019-10-21 DIAGNOSIS — D473 Essential (hemorrhagic) thrombocythemia: Secondary | ICD-10-CM

## 2019-10-21 DIAGNOSIS — D508 Other iron deficiency anemias: Secondary | ICD-10-CM

## 2019-10-21 DIAGNOSIS — D75839 Thrombocytosis, unspecified: Secondary | ICD-10-CM

## 2019-10-21 DIAGNOSIS — D638 Anemia in other chronic diseases classified elsewhere: Secondary | ICD-10-CM

## 2019-10-22 ENCOUNTER — Other Ambulatory Visit: Payer: Self-pay

## 2019-10-22 ENCOUNTER — Other Ambulatory Visit: Payer: Medicare Other

## 2019-10-22 ENCOUNTER — Inpatient Hospital Stay (HOSPITAL_COMMUNITY): Payer: Medicare Other | Attending: Hematology

## 2019-10-22 DIAGNOSIS — Z944 Liver transplant status: Secondary | ICD-10-CM | POA: Diagnosis not present

## 2019-10-22 DIAGNOSIS — Z298 Encounter for other specified prophylactic measures: Secondary | ICD-10-CM | POA: Diagnosis not present

## 2019-10-22 DIAGNOSIS — D473 Essential (hemorrhagic) thrombocythemia: Secondary | ICD-10-CM

## 2019-10-22 DIAGNOSIS — D638 Anemia in other chronic diseases classified elsewhere: Secondary | ICD-10-CM

## 2019-10-22 DIAGNOSIS — M818 Other osteoporosis without current pathological fracture: Secondary | ICD-10-CM | POA: Diagnosis not present

## 2019-10-22 DIAGNOSIS — D509 Iron deficiency anemia, unspecified: Secondary | ICD-10-CM | POA: Insufficient documentation

## 2019-10-22 DIAGNOSIS — D75839 Thrombocytosis, unspecified: Secondary | ICD-10-CM

## 2019-10-22 DIAGNOSIS — Z79899 Other long term (current) drug therapy: Secondary | ICD-10-CM | POA: Diagnosis not present

## 2019-10-22 DIAGNOSIS — K743 Primary biliary cirrhosis: Secondary | ICD-10-CM | POA: Diagnosis not present

## 2019-10-22 DIAGNOSIS — D508 Other iron deficiency anemias: Secondary | ICD-10-CM

## 2019-10-22 DIAGNOSIS — I10 Essential (primary) hypertension: Secondary | ICD-10-CM | POA: Diagnosis not present

## 2019-10-22 DIAGNOSIS — R7989 Other specified abnormal findings of blood chemistry: Secondary | ICD-10-CM | POA: Insufficient documentation

## 2019-10-22 DIAGNOSIS — D899 Disorder involving the immune mechanism, unspecified: Secondary | ICD-10-CM | POA: Diagnosis not present

## 2019-10-22 LAB — COMPREHENSIVE METABOLIC PANEL
ALT: 17 U/L (ref 0–44)
AST: 25 U/L (ref 15–41)
Albumin: 2.8 g/dL — ABNORMAL LOW (ref 3.5–5.0)
Alkaline Phosphatase: 204 U/L — ABNORMAL HIGH (ref 38–126)
Anion gap: 13 (ref 5–15)
BUN: 16 mg/dL (ref 8–23)
CO2: 24 mmol/L (ref 22–32)
Calcium: 9.1 mg/dL (ref 8.9–10.3)
Chloride: 101 mmol/L (ref 98–111)
Creatinine, Ser: 0.91 mg/dL (ref 0.44–1.00)
GFR calc Af Amer: >60 mL/min (ref >60)
GFR calc non Af Amer: >60 mL/min (ref >60)
Glucose, Bld: 138 mg/dL — ABNORMAL HIGH (ref 70–99)
Potassium: 4.1 mmol/L (ref 3.5–5.1)
Sodium: 138 mmol/L (ref 135–145)
Total Bilirubin: 0.2 mg/dL — ABNORMAL LOW (ref 0.3–1.2)
Total Protein: 6.8 g/dL (ref 6.5–8.1)

## 2019-10-22 LAB — CBC WITH DIFFERENTIAL/PLATELET
Abs Immature Granulocytes: 0.01 10*3/uL (ref 0.00–0.07)
Basophils Absolute: 0.2 10*3/uL — ABNORMAL HIGH (ref 0.0–0.1)
Basophils Relative: 2 %
Eosinophils Absolute: 0.5 10*3/uL (ref 0.0–0.5)
Eosinophils Relative: 7 %
HCT: 25.6 % — ABNORMAL LOW (ref 36.0–46.0)
Hemoglobin: 7.4 g/dL — ABNORMAL LOW (ref 12.0–15.0)
Immature Granulocytes: 0 %
Lymphocytes Relative: 33 %
Lymphs Abs: 2.1 10*3/uL (ref 0.7–4.0)
MCH: 23.2 pg — ABNORMAL LOW (ref 26.0–34.0)
MCHC: 28.9 g/dL — ABNORMAL LOW (ref 30.0–36.0)
MCV: 80.3 fL (ref 80.0–100.0)
Monocytes Absolute: 1.2 10*3/uL — ABNORMAL HIGH (ref 0.1–1.0)
Monocytes Relative: 19 %
Neutro Abs: 2.5 10*3/uL (ref 1.7–7.7)
Neutrophils Relative %: 39 %
Platelets: 584 10*3/uL — ABNORMAL HIGH (ref 150–400)
RBC: 3.19 MIL/uL — ABNORMAL LOW (ref 3.87–5.11)
RDW: 16 % — ABNORMAL HIGH (ref 11.5–15.5)
WBC: 6.4 10*3/uL (ref 4.0–10.5)
nRBC: 0.3 % — ABNORMAL HIGH (ref 0.0–0.2)

## 2019-10-22 LAB — LACTATE DEHYDROGENASE: LDH: 154 U/L (ref 98–192)

## 2019-10-22 LAB — FERRITIN: Ferritin: 7 ng/mL — ABNORMAL LOW (ref 11–307)

## 2019-10-29 ENCOUNTER — Encounter (HOSPITAL_COMMUNITY): Payer: Self-pay | Admitting: Hematology

## 2019-10-29 ENCOUNTER — Inpatient Hospital Stay (HOSPITAL_COMMUNITY): Payer: Medicare Other | Admitting: Hematology

## 2019-10-29 ENCOUNTER — Other Ambulatory Visit: Payer: Self-pay

## 2019-10-29 ENCOUNTER — Inpatient Hospital Stay (HOSPITAL_COMMUNITY): Payer: Medicare Other

## 2019-10-29 VITALS — BP 123/67 | HR 72 | Temp 97.3°F | Resp 18 | Wt 138.5 lb

## 2019-10-29 DIAGNOSIS — I10 Essential (primary) hypertension: Secondary | ICD-10-CM | POA: Diagnosis not present

## 2019-10-29 DIAGNOSIS — D638 Anemia in other chronic diseases classified elsewhere: Secondary | ICD-10-CM | POA: Diagnosis not present

## 2019-10-29 DIAGNOSIS — Z944 Liver transplant status: Secondary | ICD-10-CM | POA: Diagnosis not present

## 2019-10-29 DIAGNOSIS — R7989 Other specified abnormal findings of blood chemistry: Secondary | ICD-10-CM | POA: Diagnosis not present

## 2019-10-29 DIAGNOSIS — K743 Primary biliary cirrhosis: Secondary | ICD-10-CM | POA: Diagnosis not present

## 2019-10-29 DIAGNOSIS — Z79899 Other long term (current) drug therapy: Secondary | ICD-10-CM | POA: Diagnosis not present

## 2019-10-29 DIAGNOSIS — M818 Other osteoporosis without current pathological fracture: Secondary | ICD-10-CM | POA: Diagnosis not present

## 2019-10-29 DIAGNOSIS — D509 Iron deficiency anemia, unspecified: Secondary | ICD-10-CM | POA: Diagnosis not present

## 2019-10-29 DIAGNOSIS — D508 Other iron deficiency anemias: Secondary | ICD-10-CM | POA: Diagnosis not present

## 2019-10-29 LAB — ABO/RH: ABO/RH(D): A NEG

## 2019-10-29 LAB — PREPARE RBC (CROSSMATCH)

## 2019-10-29 NOTE — Assessment & Plan Note (Signed)
1.  Iron deficiency anemia: - She had received parenteral iron therapy in the past with  Injectafer.  Last Injectafer was on 09/16/2018. - She denies any bleeding per rectum or melena. -She had colectomy in the past secondary to Lynch syndrome.  She could not tolerate oral iron therapy. -She received IV Feraheme 510 mg on May 11, 2019 and on May 18, 2019. -Most recent labs show a hemoglobin of 7.4, MCV 80.3, ferritin level of 7.  Due to patient's significant fatigue I recommend patient receive 1 unit of packed red blood cells.  We will then follow that next week with IV Feraheme 510 mg x 2 doses 1 week apart. -He is scheduled for ERCP next week at [facility].  2.  Thrombocytosis: - She was initially worked up for thrombocytosis with negative Jak 2 mutation and BCR/ABL. -This is likely reactive to iron deficiency anemia.  3.  Primary biliary cirrhosis: - She had orthotopic liver transplant at [facility]. -She is currently on sirolimus, ursodiol, and Valtrex.   4.  Osteoporosis: As she is on Fosamax 70 mg weekly.

## 2019-10-29 NOTE — Progress Notes (Signed)
Cassidy Barber, Cassidy Barber [id number]   CLINIC:  Medical Oncology/Hematology  PCP:  [name], [address] [place] [address] [phone]   REASON FOR VISIT:  Follow-up for anemia   CURRENT THERAPY: Clinical surveillance    INTERVAL HISTORY:  Cassidy Barber 68 y.o. female presents today for follow-up.  She reports overall doing fair.  She reports significant fatigue.  She also reports shortness of breath on exertion.  Shortness of breath does resolve with rest.  Patient states she has had 2 ERCPs over the last month for placement of biliary stents.  She did develop sepsis at 1 point.  She has since recovered.  She continues follow-up with her hepatologist at [facility].  She remains on sirolimus and Actigall.  She will return to [facility] next week for an additional ERCP.   REVIEW OF SYSTEMS:  Review of Systems  Constitutional: Positive for fatigue.  HENT:  Negative.   Eyes: Negative.   Respiratory: Negative.   Cardiovascular: Negative.   Gastrointestinal: Negative.   Endocrine: Negative.   Genitourinary: Negative.    Musculoskeletal: Negative.   Skin: Negative.   Neurological: Negative.   Hematological: Negative.   Psychiatric/Behavioral: Negative.      PAST MEDICAL/SURGICAL HISTORY:  Past Medical History:  Diagnosis Date  . Anemia of chronic disease 10/26/2013  . Hernia of unspecified site of abdominal cavity without mention of obstruction or gangrene   . Hypertension   . Liver disease   . Lynch syndrome   . Lynch syndrome   . Primary biliary cirrhosis ([place]) 10/26/2013  . Thyroid disease    Past Surgical History:  Procedure Laterality Date  . ABDOMINAL HERNIA REPAIR     Patient's states that she has had 8- 9 hernia surgeries  . ABDOMINAL HYSTERECTOMY    . CHOLECYSTECTOMY  2007  . COLON RESECTION    . COLON SURGERY  2008   Done at [facility]  . COLONOSCOPY     Done at [facility]  . ESOPHAGOGASTRODUODENOSCOPY N/A 08/21/2018   Procedure: ESOPHAGOGASTRODUODENOSCOPY (EGD);  Surgeon: Rogene Houston, MD;  [facility];  Service: Endoscopy;  Laterality: N/A;  . EYE SURGERY     lasix  . FLEXIBLE SIGMOIDOSCOPY N/A 10/20/2015   Procedure: FLEXIBLE SIGMOIDOSCOPY;  Surgeon: Rogene Houston, MD;  [facility];  Service: Endoscopy;  Laterality: N/A;  72 - Dr Laural Golden has meeting until 1:00  . FLEXIBLE SIGMOIDOSCOPY N/A 07/11/2016   Procedure: FLEXIBLE SIGMOIDOSCOPY;  Surgeon: Rogene Houston, MD;  [facility];  Service: Endoscopy;  Laterality: N/A;  1200  . FLEXIBLE SIGMOIDOSCOPY N/A 08/09/2017   Procedure: FLEXIBLE SIGMOIDOSCOPY;  Surgeon: Rogene Houston, MD;  [facility];  Service: Endoscopy;  Laterality: N/A;  1:00  . FLEXIBLE SIGMOIDOSCOPY N/A 08/21/2018   Procedure: FLEXIBLE SIGMOIDOSCOPY;  Surgeon: Rogene Houston, MD;  [facility];  Service: Endoscopy;  Laterality: N/A;  . HERNIA REPAIR    . LIVER TRANSPLANT  [id number]  . multiple skin cancers removed    . POLYPECTOMY  08/09/2017   Procedure: POLYPECTOMY;  Surgeon: Rogene Houston, MD;  [facility];  Service: Endoscopy;;  colon small bowel  . REVERSE SHOULDER ARTHROPLASTY Left 07/17/2018  . REVERSE SHOULDER ARTHROPLASTY Left 07/17/2018   Procedure: LEFT REVERSE SHOULDER ARTHROPLASTY;  Surgeon: Justice Britain, MD;  Location: [place];  Service: Orthopedics;  Laterality: Left;  141min  . SHOULDER CLOSED REDUCTION Left 09/27/2019  Procedure: CLOSED REDUCTION SHOULDER;  Surgeon: Paralee Cancel, MD;  Location: [facility];  Service: Orthopedics;  Laterality: Left;  . SPLENECTOMY  2006  . TYMPANOSTOMY TUBE PLACEMENT    . UPPER GASTROINTESTINAL ENDOSCOPY     Done at [facility]     SOCIAL HISTORY:  Social History   Socioeconomic History  . Marital status: Married    Spouse name: Cassidy Barber  . Number of children: 1  . Years of education: 42  . Highest education level: High school graduate  Occupational History  .  Occupation: Disability    Employer: [employer]    Comment: Multimedia programmer  Tobacco Use  . Smoking status: Never Smoker  . Smokeless tobacco: Never Used  Substance and Sexual Activity  . Alcohol use: No    Alcohol/week: 0.0 standard drinks  . Drug use: No  . Sexual activity: Yes    Birth control/protection: None  Other Topics Concern  . Not on file  Social History Narrative   Patient lives in a two story home with her husband. She has an adult son. She is retired from being an Web designer for 30 years.    Social Determinants of Health   Financial Resource Strain:   . Difficulty of Paying Living Expenses: Not on file  Food Insecurity:   . Worried About Charity fundraiser in the Last Year: Not on file  . Ran Out of Food in the Last Year: Not on file  Transportation Needs:   . Lack of Transportation (Medical): Not on file  . Lack of Transportation (Non-Medical): Not on file  Physical Activity: Inactive  . Days of Exercise per Week: 0 days  . Minutes of Exercise per Session: 0 min  Stress:   . Feeling of Stress : Not on file  Social Connections:   . Frequency of Communication with Friends and Family: Not on file  . Frequency of Social Gatherings with Friends and Family: Not on file  . Attends Religious Services: Not on file  . Active Member of Clubs or Organizations: Not on file  . Attends Archivist Meetings: Not on file  . Marital Status: Not on file  Intimate Partner Violence:   . Fear of Current or Ex-Partner: Not on file  . Emotionally Abused: Not on file  . Physically Abused: Not on file  . Sexually Abused: Not on file    FAMILY HISTORY:  Family History  Problem Relation Age of Onset  . Prostate cancer Father   . Colon cancer Father   . Colon cancer Sister   . Lung cancer Sister   . Healthy Son   . Alcohol abuse Brother   . Allergic rhinitis Neg Hx   . Asthma Neg Hx   . Eczema Neg Hx   . Urticaria Neg Hx     CURRENT  MEDICATIONS:  Outpatient Encounter Medications as of 10/29/2019  Medication Sig Note  . alendronate (FOSAMAX) 70 MG tablet TAKE 1 TABLET BY MOUTH ONCE A WEEK WITH A FULL GLASS OF WATER ON AN EMPTY STOMACH (Patient taking differently: Take 70 mg by mouth every Thursday. )   . ALPRAZolam (XANAX) 0.5 MG tablet Take 1 tablet (0.5 mg total) by mouth at bedtime.   [name] aspirin EC 81 MG tablet Take 81 mg by mouth daily.   . Biotin 10000 MCG TABS Take 10,000 mcg by mouth daily.   [name] CALCIUM CITRATE PO Take 1,200 mg by mouth 2 (two) times daily.    . cetirizine (  ZYRTEC) 10 MG tablet Take 1-2 tablets 2 times a day max 40mg    . famotidine (PEPCID) 20 MG tablet Take 1 tablet (20 mg total) by mouth daily. Take 1 tablet twice a day (Patient taking differently: Take 20 mg by mouth at bedtime. Take 1 tablet twice a day)   . levothyroxine (SYNTHROID) 125 MCG tablet Take 1 tablet (125 mcg total) by mouth daily.   [name] losartan (COZAAR) 25 MG tablet Take 1 tablet (25 mg total) by mouth daily.   . metoprolol succinate (TOPROL XL) 25 MG 24 hr tablet Take 1 tablet (25 mg total) by mouth daily.   . pantoprazole (PROTONIX) 40 MG tablet Take 1 tablet (40 mg total) by mouth 2 (two) times daily.   . Probiotic Product (PROBIOTIC-10 PO) Take by mouth daily.   . ursodiol (ACTIGALL) 250 MG tablet Take 250 mg by mouth 2 (two) times daily.    . valACYclovir (VALTREX) 500 MG tablet TAKE ONE (1) TABLET THREE (3) TIMES EACH DAY   . vitamin B-12 (CYANOCOBALAMIN) 1000 MCG tablet Take 1,000 mcg by mouth daily.   . Vitamin D, Ergocalciferol, (DRISDOL) 1.25 MG (50000 UT) CAPS capsule Take 1 capsule (50,000 Units total) by mouth once a week.   [name] ZORTRESS 0.5 MG TABS Take 4 tablets by mouth 2 (two) times daily.    [name] acetaminophen (TYLENOL) 500 MG tablet Take 500 mg by mouth every 6 (six) hours as needed for mild pain or headache.   . cyclobenzaprine (FLEXERIL) 5 MG tablet Take 1 tablet (5 mg total) by mouth 3 (three) times daily as needed for  muscle spasms. (Patient not taking: Reported on 10/29/2019)   . EPINEPHrine 0.3 mg/0.3 mL IJ SOAJ injection Use as directed for severe allergic reaction (Patient not taking: Reported on 10/29/2019)   . fluorouracil (EFUDEX) 5 % cream Apply 1 application topically daily as needed (cancer spots).    [name] HYDROcodone-acetaminophen (NORCO/VICODIN) 5-325 MG tablet Take 1 tablet by mouth every 6 (six) hours as needed. (Patient not taking: Reported on 10/29/2019)   . hydrOXYzine (ATARAX/VISTARIL) 25 MG tablet TAKE 1 TABLET THREE TIMES DAILY AS NEEDED FOR ITCHING (Patient not taking: Reported on 10/29/2019) 04/28/2019: AS NEEDED.  [name] ondansetron (ZOFRAN ODT) 4 MG disintegrating tablet Take 1 tablet (4 mg total) by mouth every 8 (eight) hours as needed. (Patient not taking: Reported on 10/29/2019)    No facility-administered encounter medications on file as of 10/29/2019.    ALLERGIES:  Allergies  Allergen Reactions  . Ciprofloxacin Itching  . Codeine Nausea Only     PHYSICAL EXAM:  ECOG Performance status: 1  Vitals:   10/29/19 1202  BP: 123/67  Pulse: 72  Resp: 18  Temp: (!) 97.3 F (36.3 C)  SpO2: 100%   Filed Weights   10/29/19 1202  Weight: 138 lb 8 oz (62.8 kg)    Physical Exam Constitutional:      Appearance: Normal appearance.  HENT:     Head: Normocephalic.     Right Ear: External ear normal.     Left Ear: External ear normal.     Nose: Nose normal.     Mouth/Throat:     Pharynx: Oropharynx is clear.  Eyes:     Conjunctiva/sclera: Conjunctivae normal.  Cardiovascular:     Rate and Rhythm: Normal rate and regular rhythm.     Pulses: Normal pulses.     Heart sounds: Normal heart sounds.  Pulmonary:     Effort: Pulmonary effort is normal.  Breath sounds: Normal breath sounds.  Abdominal:     General: Bowel sounds are normal.  Musculoskeletal:        General: Normal range of motion.     Cervical back: Normal range of motion.  Skin:    General: Skin is warm.    Neurological:     General: No focal deficit present.     Mental Status: She is alert and oriented to person, place, and time.  Psychiatric:        Mood and Affect: Mood normal.        Behavior: Behavior normal.      LABORATORY DATA:  I have reviewed the labs as listed.  CBC    Component Value Date/Time   WBC 6.4 10/22/2019 1130   RBC 3.19 (L) 10/22/2019 1130   HGB 7.4 (L) 10/22/2019 1130   HGB 11.1 04/13/2019 0940   HCT 25.6 (L) 10/22/2019 1130   HCT 34.5 04/13/2019 0940   PLT 584 (H) 10/22/2019 1130   PLT 380 04/13/2019 0940   MCV 80.3 10/22/2019 1130   MCV 85 04/13/2019 0940   MCH 23.2 (L) 10/22/2019 1130   MCHC 28.9 (L) 10/22/2019 1130   RDW 16.0 (H) 10/22/2019 1130   RDW 13.0 04/13/2019 0940   LYMPHSABS 2.1 10/22/2019 1130   LYMPHSABS 2.4 04/13/2019 0940   MONOABS 1.2 (H) 10/22/2019 1130   EOSABS 0.5 10/22/2019 1130   EOSABS 0.5 (H) 04/13/2019 0940   BASOSABS 0.2 (H) 10/22/2019 1130   BASOSABS 0.2 04/13/2019 0940   CMP Latest Ref Rng & Units 10/22/2019 10/04/2019 09/26/2019  Glucose 70 - 99 mg/dL 138(H) 109(H) 106(H)  BUN 8 - 23 mg/dL 16 14 12   Creatinine 0.44 - 1.00 mg/dL 0.91 0.83 0.63  Sodium 135 - 145 mmol/L 138 130(L) 136  Potassium 3.5 - 5.1 mmol/L 4.1 3.1(L) 3.8  Chloride 98 - 111 mmol/L 101 97(L) 106  CO2 22 - 32 mmol/L 24 21(L) 24  Calcium 8.9 - 10.3 mg/dL 9.1 8.2(L) 8.0(L)  Total Protein 6.5 - 8.1 g/dL 6.8 6.8 7.1  Total Bilirubin 0.3 - 1.2 mg/dL 0.2(L) 0.7 0.3  Alkaline Phos 38 - 126 U/L 204(H) 356(H) 147(H)  AST 15 - 41 U/L 25 35 23  ALT 0 - 44 U/L 17 36 17         ASSESSMENT & PLAN:   Iron deficiency anemia 1.  Iron deficiency anemia: - She had received parenteral iron therapy in the past with  Injectafer.  Last Injectafer was on 09/16/2018. - She denies any bleeding per rectum or melena. -She had colectomy in the past secondary to Lynch syndrome.  She could not tolerate oral iron therapy. -She received IV Feraheme 510 mg on May 11, 2019 and on May 18, 2019. -Most recent labs show a hemoglobin of 7.4, MCV 80.3, ferritin level of 7.  Due to patient's significant fatigue I recommend patient receive 1 unit of packed red blood cells.  We will then follow that next week with IV Feraheme 510 mg x 2 doses 1 week apart. -He is scheduled for ERCP next week at [facility]  2.  Thrombocytosis: - She was initially worked up for thrombocytosis with negative Jak 2 mutation and BCR/ABL. -This is likely reactive to iron deficiency anemia.  3.  Primary biliary cirrhosis: - She had orthotopic liver transplant at [facility]. -She is currently on sirolimus, ursodiol, and Valtrex.   4.  Osteoporosis: As she is on Fosamax 70 mg weekly.      [name]  [place] [phone]

## 2019-10-30 ENCOUNTER — Encounter (HOSPITAL_COMMUNITY): Payer: Self-pay

## 2019-10-30 ENCOUNTER — Inpatient Hospital Stay (HOSPITAL_COMMUNITY): Payer: Medicare Other

## 2019-10-30 DIAGNOSIS — K743 Primary biliary cirrhosis: Secondary | ICD-10-CM | POA: Diagnosis not present

## 2019-10-30 DIAGNOSIS — M818 Other osteoporosis without current pathological fracture: Secondary | ICD-10-CM | POA: Diagnosis not present

## 2019-10-30 DIAGNOSIS — Z944 Liver transplant status: Secondary | ICD-10-CM | POA: Diagnosis not present

## 2019-10-30 DIAGNOSIS — R7989 Other specified abnormal findings of blood chemistry: Secondary | ICD-10-CM | POA: Diagnosis not present

## 2019-10-30 DIAGNOSIS — I10 Essential (primary) hypertension: Secondary | ICD-10-CM | POA: Diagnosis not present

## 2019-10-30 DIAGNOSIS — D509 Iron deficiency anemia, unspecified: Secondary | ICD-10-CM | POA: Diagnosis not present

## 2019-10-30 DIAGNOSIS — Z79899 Other long term (current) drug therapy: Secondary | ICD-10-CM | POA: Diagnosis not present

## 2019-10-30 DIAGNOSIS — D638 Anemia in other chronic diseases classified elsewhere: Secondary | ICD-10-CM

## 2019-10-30 MED ORDER — SODIUM CHLORIDE 0.9% IV SOLUTION
250.0000 mL | Freq: Once | INTRAVENOUS | Status: AC
  Administered 2019-10-30: 250 mL via INTRAVENOUS

## 2019-10-30 MED ORDER — SODIUM CHLORIDE 0.9% FLUSH: 10.0000 mL | INTRAVENOUS | Status: DC | PRN

## 2019-10-30 MED ORDER — DIPHENHYDRAMINE HCL 25 MG PO CAPS
ORAL_CAPSULE | ORAL | Status: AC
  Filled 2019-10-30: qty 1

## 2019-10-30 MED ORDER — DIPHENHYDRAMINE HCL 25 MG PO CAPS
25.0000 mg | ORAL_CAPSULE | Freq: Once | ORAL | Status: AC
  Administered 2019-10-30: 10:00:00 25 mg via ORAL

## 2019-10-30 MED ORDER — ACETAMINOPHEN 325 MG PO TABS
650.0000 mg | ORAL_TABLET | Freq: Once | ORAL | Status: AC
  Administered 2019-10-30: 650 mg via ORAL

## 2019-10-30 MED ORDER — ACETAMINOPHEN 325 MG PO TABS
ORAL_TABLET | ORAL | Status: AC
  Filled 2019-10-30: qty 2

## 2019-10-30 NOTE — Progress Notes (Signed)
[name] tolerated blood transfusion well without complaints or incident. Peripheral IV checked with positive blood return noted prior to and after transfusion complete. VSS Pt discharged self ambulatory in satisfactory condition

## 2019-10-30 NOTE — Patient Instructions (Signed)
[facility] at [facility] Discharge Instructions  Received 1 unit of PRBC's today. Follow-up as scheduled. Call clinic for any questions or concerns   Thank you for choosing [facility] at [facility] to provide your oncology and hematology care.  To afford each patient quality time with our provider, please arrive at least 15 minutes before your scheduled appointment time.   If you have a lab appointment with the Cancer Center please come in thru the Main Entrance and check in at the main information desk.  You need to re-schedule your appointment should you arrive 10 or more minutes late.  We strive to give you quality time with our providers, and arriving late affects you and other patients whose appointments are after yours.  Also, if you no show three or more times for appointments you may be dismissed from the clinic at the providers discretion.     Again, thank you for choosing [facility].  Our hope is that these requests will decrease the amount of time that you wait before being seen by our physicians.       _____________________________________________________________  Should you have questions after your visit to  Cancer Center, please contact our office at [phone] between the hours of 8:00 a.m. and 4:30 p.m.  Voicemails left after 4:00 p.m. will not be returned until the following business day.  For prescription refill requests, have your pharmacy contact our office and allow 72 hours.    Due to Covid, you will need to wear a mask upon entering the hospital. If you do not have a mask, a mask will be given to you at the Main Entrance upon arrival. For doctor visits, patients may have 1 support person with them. For treatment visits, patients can not have anyone with them due to social distancing guidelines and our immunocompromised population.     

## 2019-10-31 LAB — TYPE AND SCREEN
ABO/RH(D): A NEG
Antibody Screen: NEGATIVE
Unit division: 0

## 2019-10-31 LAB — BPAM RBC
Blood Product Expiration Date: 202101102359
ISSUE DATE / TIME: 202012181023
Unit Type and Rh: 600

## 2019-11-02 ENCOUNTER — Other Ambulatory Visit: Payer: Self-pay | Admitting: Allergy and Immunology

## 2019-11-02 ENCOUNTER — Other Ambulatory Visit (HOSPITAL_COMMUNITY): Payer: Medicare Other

## 2019-11-02 ENCOUNTER — Other Ambulatory Visit: Payer: Self-pay | Admitting: Family

## 2019-11-02 DIAGNOSIS — L509 Urticaria, unspecified: Secondary | ICD-10-CM

## 2019-11-02 DIAGNOSIS — I10 Essential (primary) hypertension: Secondary | ICD-10-CM

## 2019-11-03 ENCOUNTER — Ambulatory Visit (HOSPITAL_COMMUNITY): Payer: Medicare Other

## 2019-11-03 DIAGNOSIS — N179 Acute kidney failure, unspecified: Secondary | ICD-10-CM | POA: Diagnosis not present

## 2019-11-03 DIAGNOSIS — K9189 Other postprocedural complications and disorders of digestive system: Secondary | ICD-10-CM | POA: Diagnosis not present

## 2019-11-03 DIAGNOSIS — Z20828 Contact with and (suspected) exposure to other viral communicable diseases: Secondary | ICD-10-CM | POA: Diagnosis not present

## 2019-11-03 DIAGNOSIS — D649 Anemia, unspecified: Secondary | ICD-10-CM | POA: Diagnosis not present

## 2019-11-03 DIAGNOSIS — Z9049 Acquired absence of other specified parts of digestive tract: Secondary | ICD-10-CM | POA: Diagnosis not present

## 2019-11-03 DIAGNOSIS — Z01812 Encounter for preprocedural laboratory examination: Secondary | ICD-10-CM | POA: Diagnosis not present

## 2019-11-03 DIAGNOSIS — E039 Hypothyroidism, unspecified: Secondary | ICD-10-CM | POA: Diagnosis not present

## 2019-11-03 DIAGNOSIS — K831 Obstruction of bile duct: Secondary | ICD-10-CM | POA: Diagnosis not present

## 2019-11-03 DIAGNOSIS — I10 Essential (primary) hypertension: Secondary | ICD-10-CM | POA: Diagnosis not present

## 2019-11-03 DIAGNOSIS — K743 Primary biliary cirrhosis: Secondary | ICD-10-CM | POA: Diagnosis not present

## 2019-11-03 DIAGNOSIS — Z944 Liver transplant status: Secondary | ICD-10-CM | POA: Diagnosis not present

## 2019-11-03 DIAGNOSIS — I2729 Other secondary pulmonary hypertension: Secondary | ICD-10-CM | POA: Diagnosis not present

## 2019-11-04 ENCOUNTER — Inpatient Hospital Stay (HOSPITAL_COMMUNITY): Payer: Medicare Other

## 2019-11-04 ENCOUNTER — Other Ambulatory Visit: Payer: Self-pay

## 2019-11-04 VITALS — BP 125/65 | HR 63 | Temp 97.6°F | Resp 18

## 2019-11-04 DIAGNOSIS — K743 Primary biliary cirrhosis: Secondary | ICD-10-CM | POA: Diagnosis not present

## 2019-11-04 DIAGNOSIS — R7989 Other specified abnormal findings of blood chemistry: Secondary | ICD-10-CM | POA: Diagnosis not present

## 2019-11-04 DIAGNOSIS — Z944 Liver transplant status: Secondary | ICD-10-CM | POA: Diagnosis not present

## 2019-11-04 DIAGNOSIS — Z79899 Other long term (current) drug therapy: Secondary | ICD-10-CM | POA: Diagnosis not present

## 2019-11-04 DIAGNOSIS — M818 Other osteoporosis without current pathological fracture: Secondary | ICD-10-CM | POA: Diagnosis not present

## 2019-11-04 DIAGNOSIS — D508 Other iron deficiency anemias: Secondary | ICD-10-CM

## 2019-11-04 DIAGNOSIS — I10 Essential (primary) hypertension: Secondary | ICD-10-CM | POA: Diagnosis not present

## 2019-11-04 DIAGNOSIS — D509 Iron deficiency anemia, unspecified: Secondary | ICD-10-CM | POA: Diagnosis not present

## 2019-11-04 MED ORDER — SODIUM CHLORIDE 0.9 % IV SOLN: Freq: Once | INTRAVENOUS | Status: AC

## 2019-11-04 MED ORDER — SODIUM CHLORIDE 0.9 % IV SOLN
510.0000 mg | Freq: Once | INTRAVENOUS | Status: AC
  Administered 2019-11-04: 510 mg via INTRAVENOUS
  Filled 2019-11-04: qty 17

## 2019-11-04 NOTE — Progress Notes (Signed)
Cassidy Barber tolerated Feraheme infusion well without complaints or incident. VSS upon discharge. Peripheral IV site checked with positive blood return noted prior to and after infusion. Pt discharged self ambulatory in satisfactory condition

## 2019-11-04 NOTE — Patient Instructions (Signed)
[facility] at [facility] Discharge Instructions  Received Feraheme infusion today. Follow-up as scheduled. Call clinic for any questions or concerns   Thank you for choosing [facility] at [facility] to provide your oncology and hematology care.  To afford each patient quality time with our provider, please arrive at least 15 minutes before your scheduled appointment time.   If you have a lab appointment with the Cancer Center please come in thru the Main Entrance and check in at the main information desk.  You need to re-schedule your appointment should you arrive 10 or more minutes late.  We strive to give you quality time with our providers, and arriving late affects you and other patients whose appointments are after yours.  Also, if you no show three or more times for appointments you may be dismissed from the clinic at the providers discretion.     Again, thank you for choosing [facility].  Our hope is that these requests will decrease the amount of time that you wait before being seen by our physicians.       _____________________________________________________________  Should you have questions after your visit to [facility], please contact our office at [phone] between the hours of 8:00 a.m. and 4:30 p.m.  Voicemails left after 4:00 p.m. will not be returned until the following business day.  For prescription refill requests, have your pharmacy contact our office and allow 72 hours.    Due to Covid, you will need to wear a mask upon entering the hospital. If you do not have a mask, a mask will be given to you at the Main Entrance upon arrival. For doctor visits, patients may have 1 support person with them. For treatment visits, patients can not have anyone with them due to social distancing guidelines and our immunocompromised population.     

## 2019-11-07 ENCOUNTER — Emergency Department (HOSPITAL_COMMUNITY)
Admission: EM | Admit: 2019-11-07 | Discharge: 2019-11-07 | Disposition: A | Discharge status: Home / Self Care | Payer: Medicare Other | Attending: Emergency Medicine | Admitting: Emergency Medicine

## 2019-11-07 ENCOUNTER — Emergency Department (HOSPITAL_COMMUNITY): Payer: Medicare Other

## 2019-11-07 ENCOUNTER — Encounter (HOSPITAL_COMMUNITY): Payer: Self-pay

## 2019-11-07 ENCOUNTER — Other Ambulatory Visit: Payer: Self-pay

## 2019-11-07 DIAGNOSIS — X501XXA Overexertion from prolonged static or awkward postures, initial encounter: Secondary | ICD-10-CM | POA: Insufficient documentation

## 2019-11-07 DIAGNOSIS — S43005A Unspecified dislocation of left shoulder joint, initial encounter: Secondary | ICD-10-CM | POA: Diagnosis not present

## 2019-11-07 DIAGNOSIS — Z79899 Other long term (current) drug therapy: Secondary | ICD-10-CM | POA: Insufficient documentation

## 2019-11-07 DIAGNOSIS — I10 Essential (primary) hypertension: Secondary | ICD-10-CM | POA: Diagnosis not present

## 2019-11-07 DIAGNOSIS — Y929 Unspecified place or not applicable: Secondary | ICD-10-CM | POA: Insufficient documentation

## 2019-11-07 DIAGNOSIS — Z7982 Long term (current) use of aspirin: Secondary | ICD-10-CM | POA: Diagnosis not present

## 2019-11-07 DIAGNOSIS — Y939 Activity, unspecified: Secondary | ICD-10-CM | POA: Insufficient documentation

## 2019-11-07 DIAGNOSIS — Z96612 Presence of left artificial shoulder joint: Secondary | ICD-10-CM | POA: Diagnosis not present

## 2019-11-07 DIAGNOSIS — E039 Hypothyroidism, unspecified: Secondary | ICD-10-CM | POA: Insufficient documentation

## 2019-11-07 DIAGNOSIS — M24412 Recurrent dislocation, left shoulder: Secondary | ICD-10-CM | POA: Insufficient documentation

## 2019-11-07 DIAGNOSIS — Y999 Unspecified external cause status: Secondary | ICD-10-CM | POA: Insufficient documentation

## 2019-11-07 DIAGNOSIS — Z471 Aftercare following joint replacement surgery: Secondary | ICD-10-CM | POA: Diagnosis not present

## 2019-11-07 MED ORDER — DIPHENHYDRAMINE HCL 25 MG PO CAPS
25.0000 mg | ORAL_CAPSULE | Freq: Once | ORAL | Status: AC
  Administered 2019-11-07: 25 mg via ORAL
  Filled 2019-11-07: qty 1

## 2019-11-07 MED ORDER — PROPOFOL 10 MG/ML IV BOLUS
0.5000 mg/kg | Freq: Once | INTRAVENOUS | Status: AC
  Administered 2019-11-07: 50 mg via INTRAVENOUS
  Filled 2019-11-07: qty 20

## 2019-11-07 MED ORDER — FENTANYL CITRATE (PF) 100 MCG/2ML IJ SOLN
100.0000 ug | Freq: Once | INTRAMUSCULAR | Status: AC
  Administered 2019-11-07: 100 ug via INTRAVENOUS
  Filled 2019-11-07: qty 2

## 2019-11-07 MED ORDER — PROPOFOL 10 MG/ML IV BOLUS
INTRAVENOUS | Status: AC | PRN
  Administered 2019-11-07: 50 mg via INTRAVENOUS

## 2019-11-07 MED ORDER — LIDOCAINE-EPINEPHRINE (PF) 2 %-1:200000 IJ SOLN
20.0000 mL | Freq: Once | INTRAMUSCULAR | Status: AC
  Administered 2019-11-07: 20 mL via INTRADERMAL
  Filled 2019-11-07: qty 20

## 2019-11-07 MED ORDER — DIPHENHYDRAMINE HCL 50 MG/ML IJ SOLN
12.5000 mg | Freq: Once | INTRAMUSCULAR | Status: DC
  Filled 2019-11-07: qty 1

## 2019-11-07 NOTE — ED Triage Notes (Addendum)
Pt states that her left shoulder has become dislocated again. Pt states that it is titanium. Pt states that there was no injury/trauma, that it fell out on its own.

## 2019-11-07 NOTE — Discharge Instructions (Addendum)
Follow up with ortho in the office.  Return for repeat event.

## 2019-11-07 NOTE — ED Provider Notes (Signed)
[facility] DEPT Provider Note   CSN: [id number] Arrival date & time: 11/07/19  1155     History Chief Complaint  Patient presents with  . Shoulder Pain    Cassidy Barber is a 68 y.o. female.  68 yo F with a chief complaint of left shoulder pain.  Patient felt her shoulder dislocated earlier today when she was reaching across her body.  She felt it come out of place.  Complaining of severe pain to the area.  Patient is only dislocated it once before and that was about a month ago.  She presented to [facility] emergency departments and had to be transferred to hospital to be taken to the OR for reduction.  She states that her orthopedic surgeon said if it dislocated again that she would likely need a revision.  The history is provided by the patient.  Shoulder Pain Location:  Shoulder Shoulder location:  L shoulder Injury: no   Pain details:    Quality:  Shooting and sharp   Radiates to:  Does not radiate   Severity:  Severe   Onset quality:  Gradual   Duration:  2 hours   Timing:  Constant   Progression:  Unchanged Handedness:  Right-handed Dislocation: yes   Prior injury to area:  No Relieved by:  Nothing Worsened by:  Bearing weight, exercise and movement Ineffective treatments:  None tried Associated symptoms: no fever        Past Medical History:  Diagnosis Date  . Anemia of chronic disease 10/26/2013  . Hernia of unspecified site of abdominal cavity without mention of obstruction or gangrene   . Hypertension   . Liver disease   . Lynch syndrome   . Lynch syndrome   . Primary biliary cirrhosis ([place]) 10/26/2013  . Thyroid disease     Patient Active Problem List   Diagnosis Date Noted  . Anterior dislocation of left shoulder 09/26/2019  . Hx of colonic polyps 09/04/2019  . Chest pain 09/04/2019  . Benzodiazepine dependence ([place]) 04/13/2019  . Controlled substance agreement signed 04/13/2019  . Urticaria 09/08/2018  . S/p  reverse total shoulder arthroplasty 07/17/2018  . History of colon cancer 06/12/2018  . History of colonic polyps 06/12/2018  . 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  . Gastroesophageal reflux disease 06/12/2018  . Chronic diarrhea 06/26/2017  . Iron deficiency anemia 06/26/2017  . GAD (generalized anxiety disorder) 08/28/2016  . Vitamin D deficiency 08/28/2016  . Insomnia 08/28/2016  . Convulsions ([place]) 11/30/2015  . Liver transplant recipient [facility]) 11/30/2015  . Seizures ([place]) 09/27/2015  . Essential hypertension, benign 03/10/2015  . Hx of liver transplant ([place]) 12/08/2014  . Diarrhea 12/08/2014  . Colon cancer ([place]) 12/08/2014  . Hepatic encephalopathy ([place]) 10/26/2013  . Anemia of chronic disease 10/26/2013  . Personal history of colonic polyps 12/27/2012  . PBC (primary biliary cirrhosis) 03/24/2012  . Hypothyroidism 03/24/2012  . Ventral hernia, recurrent 03/24/2012    Past Surgical History:  Procedure Laterality Date  . ABDOMINAL HERNIA REPAIR     Patient's states that she has had 8- 9 hernia surgeries  . ABDOMINAL HYSTERECTOMY    . CHOLECYSTECTOMY  2007  . COLON RESECTION    . COLON SURGERY  2008   Done at [facility]  . COLONOSCOPY     Done at [facility]  . ESOPHAGOGASTRODUODENOSCOPY N/A 08/21/2018   Procedure: ESOPHAGOGASTRODUODENOSCOPY (EGD);  Surgeon: Rogene Houston, MD;  [facility];  Service: Endoscopy;  Laterality: N/A;  . EYE  SURGERY     lasix  . FLEXIBLE SIGMOIDOSCOPY N/A 10/20/2015   Procedure: FLEXIBLE SIGMOIDOSCOPY;  Surgeon: Rogene Houston, MD;  Location: [facility];  Service: Endoscopy;  Laterality: N/A;  77 - Dr Laural Golden has meeting until 1:00  . FLEXIBLE SIGMOIDOSCOPY N/A 07/11/2016   Procedure: FLEXIBLE SIGMOIDOSCOPY;  Surgeon: Rogene Houston, MD;  Location: [facility];  Service: Endoscopy;  Laterality: N/A;  1200  . FLEXIBLE SIGMOIDOSCOPY N/A 08/09/2017   Procedure: FLEXIBLE SIGMOIDOSCOPY;  Surgeon: Rogene Houston, MD;  Location[facility];   Service: Endoscopy;  Laterality: N/A;  1:00  . FLEXIBLE SIGMOIDOSCOPY N/A 08/21/2018   Procedure: FLEXIBLE SIGMOIDOSCOPY;  Surgeon: Rogene Houston, MD;  [facility];  Service: Endoscopy;  Laterality: N/A;  . HERNIA REPAIR    . LIVER TRANSPLANT  [id number]  . multiple skin cancers removed    . POLYPECTOMY  08/09/2017   Procedure: POLYPECTOMY;  Surgeon: Rogene Houston, MD;  Location: [facility];  Service: Endoscopy;;  colon small bowel  . REVERSE SHOULDER ARTHROPLASTY Left 07/17/2018  . REVERSE SHOULDER ARTHROPLASTY Left 07/17/2018   Procedure: LEFT REVERSE SHOULDER ARTHROPLASTY;  Surgeon: Justice Britain, MD;  Location: [facility];  Service: Orthopedics;  Laterality: Left;  134min  . SHOULDER CLOSED REDUCTION Left 09/27/2019   Procedure: CLOSED REDUCTION SHOULDER;  Surgeon: Paralee Cancel, MD;  Location: [facility];  Service: Orthopedics;  Laterality: Left;  . SPLENECTOMY  2006  . TYMPANOSTOMY TUBE PLACEMENT    . UPPER GASTROINTESTINAL ENDOSCOPY     Done at [facility]     OB History   No obstetric history on file.     Family History  Problem Relation Age of Onset  . Prostate cancer Father   . Colon cancer Father   . Colon cancer Sister   . Lung cancer Sister   . Healthy Son   . Alcohol abuse Brother   . Allergic rhinitis Neg Hx   . Asthma Neg Hx   . Eczema Neg Hx   . Urticaria Neg Hx     Social History   Tobacco Use  . Smoking status: Never Smoker  . Smokeless tobacco: Never Used  Substance Use Topics  . Alcohol use: No    Alcohol/week: 0.0 standard drinks  . Drug use: No    Home Medications Prior to Admission medications   Medication Sig Start Date End Date Taking? Authorizing Provider  acetaminophen (TYLENOL) 500 MG tablet Take 500 mg by mouth every 6 (six) hours as needed for mild pain or headache.    [provider]  alendronate (FOSAMAX) 70 MG tablet TAKE 1 TABLET BY MOUTH ONCE A WEEK WITH A FULL GLASS OF WATER ON AN EMPTY STOMACH Patient taking  differently: Take 70 mg by mouth every Thursday.  08/03/19   [name], FNP  ALPRAZolam Duanne Moron) 0.5 MG tablet Take 1 tablet (0.5 mg total) by mouth at bedtime. 10/19/19   [name] A, FNP  aspirin EC 81 MG tablet Take 81 mg by mouth daily.    [provider]  Biotin 10000 MCG TABS Take 10,000 mcg by mouth daily.    [provider]  CALCIUM CITRATE PO Take 1,200 mg by mouth 2 (two) times daily.     [provider]  cetirizine (ZYRTEC) 10 MG tablet Take 1-2 tablets 2 times a day max 40mg  04/13/19   Hawks, Alyse Low A, FNP  cyclobenzaprine (FLEXERIL) 5 MG tablet Take 1 tablet (5 mg total) by  mouth 3 (three) times daily as needed for muscle spasms. Patient not taking: Reported on 10/29/2019 09/12/17   [name] A, FNP  EPINEPHrine 0.3 mg/0.3 mL IJ SOAJ injection Use as directed for severe allergic reaction Patient not taking: Reported on 10/29/2019 10/21/18   Jiles Prows, MD  famotidine (PEPCID) 20 MG tablet Take 1 tablet (20 mg total) by mouth daily. Take 1 tablet twice a day Patient taking differently: Take 20 mg by mouth at bedtime. Take 1 tablet twice a day 04/13/19   [name] A, FNP  fluorouracil (EFUDEX) 5 % cream Apply 1 application topically daily as needed (cancer spots).  06/11/18   [provider]  HYDROcodone-acetaminophen (NORCO/VICODIN) 5-325 MG tablet Take 1 tablet by mouth every 6 (six) hours as needed. Patient not taking: Reported on 10/29/2019 10/19/19   [name] A, FNP  hydrOXYzine (ATARAX/VISTARIL) 25 MG tablet TAKE 1 TABLET THREE TIMES DAILY AS NEEDED FOR ITCHING Patient not taking: Reported on 10/29/2019 09/08/18   Terald Sleeper, PA-C  levothyroxine (SYNTHROID) 125 MCG tablet Take 1 tablet (125 mcg total) by mouth daily. 10/14/19   [name], FNP  losartan (COZAAR) 25 MG tablet TAKE ONE (1) TABLET EACH DAY 11/02/19   [name] A, FNP  metoprolol succinate (TOPROL XL) 25 MG 24 hr tablet Take 1 tablet (25 mg  total) by mouth daily. 02/02/19 02/02/20  [name] A, FNP  ondansetron (ZOFRAN ODT) 4 MG disintegrating tablet Take 1 tablet (4 mg total) by mouth every 8 (eight) hours as needed. Patient not taking: Reported on 10/29/2019 07/01/19   Fredia Sorrow, MD  pantoprazole (PROTONIX) 40 MG tablet Take 1 tablet (40 mg total) by mouth 2 (two) times daily. 08/28/19   [name], FNP  Probiotic Product (PROBIOTIC-10 PO) Take by mouth daily.    [provider]  ursodiol (ACTIGALL) 250 MG tablet Take 250 mg by mouth 2 (two) times daily.     [provider]  valACYclovir (VALTREX) 500 MG tablet TAKE ONE (1) TABLET THREE (3) TIMES EACH DAY 06/25/19   Terald Sleeper, PA-C  vitamin B-12 (CYANOCOBALAMIN) 1000 MCG tablet Take 1,000 mcg by mouth daily.    [provider]  Vitamin D, Ergocalciferol, (DRISDOL) 1.25 MG (50000 UT) CAPS capsule Take 1 capsule (50,000 Units total) by mouth once a week. 06/02/19   Hawks, Alyse Low A, FNP  ZORTRESS 0.5 MG TABS Take 4 tablets by mouth 2 (two) times daily.  12/12/18   [provider]    Allergies    Ciprofloxacin and Codeine  Review of Systems   Review of Systems  Constitutional: Negative for chills and fever.  HENT: Negative for congestion and rhinorrhea.   Eyes: Negative for redness and visual disturbance.  Respiratory: Negative for shortness of breath and wheezing.   Cardiovascular: Negative for chest pain and palpitations.  Gastrointestinal: Negative for nausea and vomiting.  Genitourinary: Negative for dysuria and urgency.  Musculoskeletal: Positive for arthralgias. Negative for myalgias.  Skin: Negative for pallor and wound.  Neurological: Negative for dizziness and headaches.    Physical Exam Updated Vital Signs BP (!) 152/77 (BP Location: Right Arm)   Pulse 71   Temp 97.9 F (36.6 C)   Resp 18   Ht 5\' 4"  (1.626 m)   Wt 62.1 kg   SpO2 99%   BMI 23.52 kg/m   Physical Exam Vitals and nursing note reviewed.    Constitutional:      General: She is not  in acute distress.    Appearance: She is well-developed. She is not diaphoretic.  HENT:     Head: Normocephalic and atraumatic.  Eyes:     Pupils: Pupils are equal, round, and reactive to light.  Cardiovascular:     Rate and Rhythm: Normal rate and regular rhythm.     Heart sounds: No murmur. No friction rub. No gallop.   Pulmonary:     Effort: Pulmonary effort is normal.     Breath sounds: No wheezing or rales.  Abdominal:     General: There is no distension.     Palpations: Abdomen is soft.     Tenderness: There is no abdominal tenderness.  Musculoskeletal:        General: Tenderness and deformity present.     Cervical back: Normal range of motion and neck supple.     Comments: Obvious deformity to the left shoulder.  She has a new anterior palpable humeral head.  Pulse motor and sensation are intact distally.  Skin:    General: Skin is warm and dry.  Neurological:     Mental Status: She is alert and oriented to person, place, and time.  Psychiatric:        Behavior: Behavior normal.     ED Results / Procedures / Treatments   Labs (all labs ordered are listed, but only abnormal results are displayed) Labs Reviewed - No data to display  EKG None  Radiology DG Shoulder Left  Result Date: 11/07/2019 CLINICAL DATA:  Left shoulder dislocation. EXAM: LEFT SHOULDER - 2+ VIEW COMPARISON:  09/27/2019 FINDINGS: Scapular Y-view demonstrates anterior dislocation of the humeral component in this patient status post left reverse shoulder arthroplasty no evidence for periprosthetic fracture. Bones are diffusely demineralized. IMPRESSION: Anterior dislocation of the humeral component in this patient status post left reverse shoulder arthroplasty. Electronically Signed   By: Misty Stanley M.D.   On: 11/07/2019 12:55    Procedures .Sedation  Date/Time: 11/07/2019 2:47 PM Performed by: Deno Etienne, DO Authorized by: Deno Etienne, DO    Consent:    Consent obtained:  Verbal   Consent given by:  Patient   Risks discussed:  Allergic reaction, dysrhythmia, inadequate sedation, nausea, prolonged hypoxia resulting in organ damage, prolonged sedation necessitating reversal, respiratory compromise necessitating ventilatory assistance and intubation and vomiting   Alternatives discussed:  Analgesia without sedation, anxiolysis and regional anesthesia Universal protocol:    Procedure explained and questions answered to patient or proxy's satisfaction: yes     Relevant documents present and verified: yes     Test results available and properly labeled: yes     Imaging studies available: yes     Required blood products, implants, devices, and special equipment available: yes     Site/side marked: yes     Immediately prior to procedure a time out was called: yes     Patient identity confirmation method:  Verbally with patient Indications:    Procedure performed:  Dislocation reduction   Procedure necessitating sedation performed by:  Physician performing sedation Pre-sedation assessment:    Time since last food or drink:  6   ASA classification: class 1 - normal, healthy patient     Neck mobility: normal     Mouth opening:  3 or more finger widths   Thyromental distance:  4 finger widths   Mallampati score:  I - soft palate, uvula, fauces, pillars visible   Pre-sedation assessments completed and reviewed: airway patency, cardiovascular function, hydration status, mental status, nausea/vomiting,  pain level, respiratory function and temperature   Immediate pre-procedure details:    Reassessment: Patient reassessed immediately prior to procedure     Reviewed: vital signs, relevant labs/tests and NPO status     Verified: bag valve mask available, emergency equipment available, intubation equipment available, IV patency confirmed, oxygen available and suction available   Procedure details (see MAR for exact dosages):     Preoxygenation:  Nasal cannula   Sedation:  Propofol   Intended level of sedation: moderate (conscious sedation)   Analgesia:  Fentanyl   Intra-procedure monitoring:  Blood pressure monitoring, cardiac monitor, continuous pulse oximetry, frequent LOC assessments, frequent vital sign checks and continuous capnometry   Intra-procedure events: respiratory depression     Intra-procedure management:  Supplemental oxygen (jaw thrust)   Total Provider sedation time (minutes):  35 Post-procedure details:    Attendance: Constant attendance by certified staff until patient recovered     Recovery: Patient returned to pre-procedure baseline     Post-sedation assessments completed and reviewed: airway patency, cardiovascular function, hydration status, mental status, nausea/vomiting, pain level, respiratory function and temperature     Patient is stable for discharge or admission: yes     Patient tolerance:  Tolerated well, no immediate complications Reduction of dislocation  Date/Time: 11/07/2019 2:48 PM Performed by: Deno Etienne, DO Authorized by: Deno Etienne, DO  Consent: Verbal consent obtained. Written consent obtained. Consent given by: patient Patient understanding: patient does not state understanding of the procedure being performed Patient consent: the patient's understanding of the procedure does not match consent given Procedure consent: procedure consent does not match procedure scheduled Site marked: the operative site was not marked Imaging studies: imaging studies available Required items: required blood products, implants, devices, and special equipment available Patient identity confirmed: verbally with patient Time out: Immediately prior to procedure a "time out" was called to verify the correct patient, procedure, equipment, support staff and site/side marked as required. Local anesthesia used: yes  Anesthesia: Local anesthesia used: yes Local Anesthetic: lidocaine 2% with  epinephrine Anesthetic total: 20 mL  Sedation: Patient sedated: yes Sedation type: moderate (conscious) sedation Sedatives: propofol Analgesia: fentanyl Vitals: Vital signs were monitored during sedation.  Patient tolerance: patient tolerated the procedure well with no immediate complications    (including critical care time)  Medications Ordered in ED Medications  fentaNYL (SUBLIMAZE) injection 100 mcg (100 mcg Intravenous Given 11/07/19 1247)  lidocaine-EPINEPHrine (XYLOCAINE W/EPI) 2 %-1:200000 (PF) injection 20 mL (20 mLs Intradermal Given by Other 11/07/19 1345)  fentaNYL (SUBLIMAZE) injection 100 mcg (100 mcg Intravenous Given 11/07/19 1436)  propofol (DIPRIVAN) 10 mg/mL bolus/IV push 31.1 mg (50 mg Intravenous Given 11/07/19 1440)  propofol (DIPRIVAN) 10 mg/mL bolus/IV push (50 mg Intravenous Given 11/07/19 1439)  diphenhydrAMINE (BENADRYL) capsule 25 mg (25 mg Oral Given 11/07/19 1456)    ED Course  I have reviewed the triage vital signs and the nursing notes.  Pertinent labs & imaging results that were available during my care of the patient were reviewed by me and considered in my medical decision making (see chart for details).    MDM Rules/Calculators/A&P                      68 yo F with a chief complaints of left shoulder pain.  Clinically the patient has a shoulder dislocation.  Was seen in the ED about a month ago for the same.  Will attempt to reduce at bedside.  I discussed this with Dr. Rolena Infante,  orthopedics.  Recommended attempted reduction at bedside.  Was able to reduce with difficulty, use the same maneuver as is documented to the most recent OR case traction and posterior pressure on the humeral head.  Will obtain reduction film.  Placed in sling.  Orthopedic follow-up.  Post reduction films with good alignment.  D/c home.    3:09 PM:  I have discussed the diagnosis/risks/treatment options with the patient and believe the pt to be eligible for discharge  home to follow-up with Ortho. We also discussed returning to the ED immediately if new or worsening sx occur. We discussed the sx which are most concerning (e.g., sudden worsening pain, fever, redness to the shoulder, repeat dislocation) that necessitate immediate return. Medications administered to the patient during their visit and any new prescriptions provided to the patient are listed below.  Medications given during this visit Medications  fentaNYL (SUBLIMAZE) injection 100 mcg (100 mcg Intravenous Given 11/07/19 1247)  lidocaine-EPINEPHrine (XYLOCAINE W/EPI) 2 %-1:200000 (PF) injection 20 mL (20 mLs Intradermal Given by Other 11/07/19 1345)  fentaNYL (SUBLIMAZE) injection 100 mcg (100 mcg Intravenous Given 11/07/19 1436)  propofol (DIPRIVAN) 10 mg/mL bolus/IV push 31.1 mg (50 mg Intravenous Given 11/07/19 1440)  propofol (DIPRIVAN) 10 mg/mL bolus/IV push (50 mg Intravenous Given 11/07/19 1439)  diphenhydrAMINE (BENADRYL) capsule 25 mg (25 mg Oral Given 11/07/19 1456)     The patient appears reasonably screen and/or stabilized for discharge and I doubt any other medical condition or other [facility] requiring further screening, evaluation, or treatment in the ED at this time prior to discharge.    Final Clinical Impression(s) / ED Diagnoses Final diagnoses:  Recurrent dislocation of left shoulder    Rx / DC Orders ED Discharge Orders    None       Deno Etienne, DO 11/07/19 1509

## 2019-11-09 ENCOUNTER — Other Ambulatory Visit: Payer: Self-pay | Admitting: Family Medicine

## 2019-11-09 ENCOUNTER — Other Ambulatory Visit: Payer: Self-pay | Admitting: *Deleted

## 2019-11-09 DIAGNOSIS — L509 Urticaria, unspecified: Secondary | ICD-10-CM

## 2019-11-09 MED ORDER — FAMOTIDINE 20 MG PO TABS: 20.0000 mg | ORAL_TABLET | Freq: Every day | ORAL | 1 refills | Status: DC

## 2019-11-09 MED ORDER — ALENDRONATE SODIUM 70 MG PO TABS: ORAL_TABLET | ORAL | 0 refills | Status: DC

## 2019-11-09 MED ORDER — ALENDRONATE SODIUM 70 MG PO TABS: 70.0000 mg | ORAL_TABLET | ORAL | 0 refills | Status: DC

## 2019-11-10 ENCOUNTER — Encounter (HOSPITAL_COMMUNITY): Payer: Self-pay

## 2019-11-10 ENCOUNTER — Ambulatory Visit (HOSPITAL_COMMUNITY): Payer: Medicare Other

## 2019-11-10 ENCOUNTER — Other Ambulatory Visit (HOSPITAL_COMMUNITY)
Admission: RE | Admit: 2019-11-10 | Discharge: 2019-11-10 | Disposition: A | Discharge status: Home / Self Care | Payer: Medicare Other | Source: Ambulatory Visit | Attending: Orthopedic Surgery | Admitting: Orthopedic Surgery

## 2019-11-10 ENCOUNTER — Other Ambulatory Visit: Payer: Self-pay

## 2019-11-10 DIAGNOSIS — Z1509 Genetic susceptibility to other malignant neoplasm: Secondary | ICD-10-CM | POA: Diagnosis not present

## 2019-11-10 DIAGNOSIS — Z96612 Presence of left artificial shoulder joint: Secondary | ICD-10-CM | POA: Diagnosis not present

## 2019-11-10 DIAGNOSIS — T84028A Dislocation of other internal joint prosthesis, initial encounter: Secondary | ICD-10-CM | POA: Diagnosis not present

## 2019-11-10 DIAGNOSIS — Z944 Liver transplant status: Secondary | ICD-10-CM | POA: Diagnosis not present

## 2019-11-10 DIAGNOSIS — E079 Disorder of thyroid, unspecified: Secondary | ICD-10-CM | POA: Diagnosis not present

## 2019-11-10 DIAGNOSIS — Z79899 Other long term (current) drug therapy: Secondary | ICD-10-CM | POA: Diagnosis not present

## 2019-11-10 DIAGNOSIS — Z20828 Contact with and (suspected) exposure to other viral communicable diseases: Secondary | ICD-10-CM | POA: Diagnosis not present

## 2019-11-10 DIAGNOSIS — T84038A Mechanical loosening of other internal prosthetic joint, initial encounter: Secondary | ICD-10-CM | POA: Diagnosis not present

## 2019-11-10 DIAGNOSIS — Z85828 Personal history of other malignant neoplasm of skin: Secondary | ICD-10-CM | POA: Diagnosis not present

## 2019-11-10 DIAGNOSIS — Z7983 Long term (current) use of bisphosphonates: Secondary | ICD-10-CM | POA: Diagnosis not present

## 2019-11-10 DIAGNOSIS — T84098A Other mechanical complication of other internal joint prosthesis, initial encounter: Secondary | ICD-10-CM | POA: Diagnosis not present

## 2019-11-10 DIAGNOSIS — Z7982 Long term (current) use of aspirin: Secondary | ICD-10-CM | POA: Diagnosis not present

## 2019-11-10 DIAGNOSIS — M25312 Other instability, left shoulder: Secondary | ICD-10-CM | POA: Diagnosis not present

## 2019-11-10 DIAGNOSIS — I10 Essential (primary) hypertension: Secondary | ICD-10-CM | POA: Diagnosis not present

## 2019-11-10 DIAGNOSIS — G8918 Other acute postprocedural pain: Secondary | ICD-10-CM | POA: Diagnosis not present

## 2019-11-10 LAB — SARS CORONAVIRUS 2 (TAT 6-24 HRS): SARS Coronavirus 2: NEGATIVE

## 2019-11-10 NOTE — Patient Instructions (Signed)
DUE TO COVID-19 ONLY ONE VISITOR IS ALLOWED TO COME WITH YOU AND STAY IN THE WAITING ROOM ONLY DURING PRE OP AND PROCEDURE DAY OF SURGERY. THE 1 VISITOR MAY VISIT WITH YOU AFTER SURGERY IN YOUR PRIVATE ROOM DURING VISITING HOURS ONLY!  YOU NEED TO HAVE A COVID 19 TEST ON_______ @_______ , THIS TEST MUST BE DONE BEFORE SURGERY, COME  [place], [place] , [id number].  ([place]) ONCE YOUR COVID TEST IS COMPLETED, PLEASE BEGIN THE QUARANTINE INSTRUCTIONS AS OUTLINED IN YOUR HANDOUT.                Cassidy Barber  11/10/2019   Your procedure is scheduled on: 11-12-19   Report to [facility] Main  Entrance   Report to admitting at         1030 AM     Call this number if you have problems the morning of surgery [phone]    Remember: NO SOLID FOOD AFTER MIDNIGHT THE NIGHT PRIOR TO SURGERY. NOTHING BY MOUTH EXCEPT CLEAR LIQUIDS UNTIL   1000 am  . PLEASE FINISH ENSURE DRINK PER SURGEON ORDER  WHICH NEEDS TO BE COMPLETED AT     1000 am then nothing by mouth.    CLEAR LIQUID DIET   Foods Allowed                                                                     Foods Excluded  Coffee and tea, regular and decaf                             liquids that you cannot  Plain Jell-O any favor except red or purple                                           see through such as: Fruit ices (not with fruit pulp)                                     milk, soups, orange juice  Iced Popsicles                                    All solid food Carbonated beverages, regular and diet                                    Cranberry, grape and apple juices Sports drinks like Gatorade Lightly seasoned clear broth or consume(fat free) Sugar, honey syrup  ___________________________________________________________________    BRUSH YOUR TEETH MORNING OF SURGERY AND RINSE YOUR MOUTH OUT, NO CHEWING GUM CANDY OR MINTS.     Take these medicines the morning of surgery with A SIP  OF WATER: zortress, valtrex,ursodil, protonix ,metoprolol, levothyroxine, famotidine, zyrtec  You may not have any metal on your body including hair pins and              piercings  Do not wear jewelry, make-up, lotions, powders or perfumes, deodorant             Do not wear nail polish on your fingernails.  Do not shave  48 hours prior to surgery.     Do not bring valuables to the hospital. [place].  Contacts, dentures or bridgework may not be worn into surgery.       Patients discharged the day of surgery will not be allowed to drive home. IF YOU ARE HAVING SURGERY AND GOING HOME THE SAME DAY, YOU MUST HAVE AN ADULT TO DRIVE YOU HOME AND BE WITH YOU FOR 24 HOURS. YOU MAY GO HOME BY TAXI OR UBER OR ORTHERWISE, BUT AN ADULT MUST ACCOMPANY YOU HOME AND STAY WITH YOU FOR 24 HOURS.  Name and phone number of your driver:  Special Instructions: N/A              Please read over the following fact sheets you were given: _____________________________________________________________________      [facility] - Preparing for Surgery Before surgery, you can play an important role.  Because skin is not sterile, your skin needs to be as free of germs as possible.  You can reduce the number of germs on your skin by washing with CHG (chlorahexidine gluconate) soap before surgery.  CHG is an antiseptic cleaner which kills germs and bonds with the skin to continue killing germs even after washing. Please DO NOT use if you have an allergy to CHG or antibacterial soaps.  If your skin becomes reddened/irritated stop using the CHG and inform your nurse when you arrive at Short Stay. Do not shave (including legs and underarms) for at least 48 hours prior to the first CHG shower.  You may shave your face/neck. Please follow these instructions carefully:  1.  Shower with CHG Soap the night before surgery and the  morning of  Surgery.  2.  If you choose to wash your hair, wash your hair first as usual with your  normal  shampoo.  3.  After you shampoo, rinse your hair and body thoroughly to remove the  shampoo.                           4.  Use CHG as you would any other liquid soap.  You can apply chg directly  to the skin and wash                       Gently with a scrungie or clean washcloth.  5.  Apply the CHG Soap to your body ONLY FROM THE NECK DOWN.   Do not use on face/ open                           Wound or open sores. Avoid contact with eyes, ears mouth and genitals (private parts).                       Wash face,  Genitals (private parts) with your normal soap.             6.  Wash thoroughly, paying special attention to the area where your surgery  will be performed.  7.  Thoroughly rinse your body with warm water from the neck down.  8.  DO NOT shower/wash with your normal soap after using and rinsing off  the CHG Soap.                9.  Pat yourself dry with a clean towel.            10.  Wear clean pajamas.            11.  Place clean sheets on your bed the night of your first shower and do not  sleep with pets. Day of Surgery : Do not apply any lotions/deodorants the morning of surgery.  Please wear clean clothes to the hospital/surgery center.  FAILURE TO FOLLOW THESE INSTRUCTIONS MAY RESULT IN THE CANCELLATION OF YOUR SURGERY PATIENT SIGNATURE_________________________________  NURSE SIGNATURE__________________________________  ________________________________________________________________________   Cassidy Barber  An incentive spirometer is a tool that can help keep your lungs clear and active. This tool measures how well you are filling your lungs with each breath. Taking long deep breaths may help reverse or decrease the chance of developing breathing (pulmonary) problems (especially infection) following:  A long period of time when you are unable to move or be active. BEFORE  THE PROCEDURE   If the spirometer includes an indicator to show your best effort, your nurse or respiratory therapist will set it to a desired goal.  If possible, sit up straight or lean slightly forward. Try not to slouch.  Hold the incentive spirometer in an upright position. INSTRUCTIONS FOR USE  1. Sit on the edge of your bed if possible, or sit up as far as you can in bed or on a chair. 2. Hold the incentive spirometer in an upright position. 3. Breathe out normally. 4. Place the mouthpiece in your mouth and seal your lips tightly around it. 5. Breathe in slowly and as deeply as possible, raising the piston or the ball toward the top of the column. 6. Hold your breath for 3-5 seconds or for as long as possible. Allow the piston or ball to fall to the bottom of the column. 7. Remove the mouthpiece from your mouth and breathe out normally. 8. Rest for a few seconds and repeat Steps 1 through 7 at least 10 times every 1-2 hours when you are awake. Take your time and take a few normal breaths between deep breaths. 9. The spirometer may include an indicator to show your best effort. Use the indicator as a goal to work toward during each repetition. 10. After each set of 10 deep breaths, practice coughing to be sure your lungs are clear. If you have an incision (the cut made at the time of surgery), support your incision when coughing by placing a pillow or rolled up towels firmly against it. Once you are able to get out of bed, walk around indoors and cough well. You may stop using the incentive spirometer when instructed by your caregiver.  RISKS AND COMPLICATIONS  Take your time so you do not get dizzy or light-headed.  If you are in pain, you may need to take or ask for pain medication before doing incentive spirometry. It is harder to take a deep breath if you are having pain. AFTER USE  Rest and breathe slowly and easily.  It can be helpful to keep track of a log of your progress.  Your caregiver can provide you with a simple table to help with this. If you are using the spirometer at home, follow these instructions: [place] IF:   You are having difficultly using the spirometer.  You have trouble using the spirometer as often as instructed.  Your pain medication is not giving enough relief while using the spirometer.  You develop fever of 100.5 F (38.1 C) or higher. SEEK IMMEDIATE MEDICAL CARE IF:   You cough up bloody sputum that had not been present before.  You develop fever of 102 F (38.9 C) or greater.  You develop worsening pain at or near the incision site. MAKE SURE YOU:   Understand these instructions.  Will watch your condition.  Will get help right away if you are not doing well or get worse. Document Released: 03/11/2007 Document Revised: 01/21/2012 Document Reviewed: 05/12/2007 [facility] Patient Information 2014 [place], [place].   ________________________________________________________________________

## 2019-11-10 NOTE — Progress Notes (Signed)
PCP - [name],  FNP  Cardiologist -   Chest x-ray - 1 view 10-04-19 epic EKG - 08-28-19 epic Stress Test -  ECHO -  Cardiac Cath -   Sleep Study -  CPAP -   Fasting Blood Sugar -  Checks Blood Sugar _____ times a day  Blood Thinner Instructions: Aspirin Instructions: Last Dose:  Anesthesia review: 2015 liver transplant at [facility] [place]  Hgb9.8 states has a second iron infusion scheduled for 11-11-19  Patient denies shortness of breath, fever, cough and chest pain at PAT appointment  NONE   Patient verbalized understanding of instructions that were given to them at the PAT appointment. Patient was also instructed that they will need to review over the PAT instructions again at home before surgery.

## 2019-11-11 ENCOUNTER — Encounter (HOSPITAL_COMMUNITY): Payer: Self-pay

## 2019-11-11 ENCOUNTER — Ambulatory Visit (HOSPITAL_COMMUNITY): Payer: Medicare Other

## 2019-11-11 ENCOUNTER — Other Ambulatory Visit: Payer: Self-pay

## 2019-11-11 ENCOUNTER — Inpatient Hospital Stay (HOSPITAL_COMMUNITY): Payer: Medicare Other

## 2019-11-11 ENCOUNTER — Encounter (HOSPITAL_COMMUNITY)
Admission: RE | Admit: 2019-11-11 | Discharge: 2019-11-11 | Disposition: A | Discharge status: Home / Self Care | Payer: Medicare Other | Source: Ambulatory Visit | Attending: Orthopedic Surgery | Admitting: Orthopedic Surgery

## 2019-11-11 VITALS — BP 116/59 | HR 72 | Temp 97.8°F | Resp 18

## 2019-11-11 DIAGNOSIS — I10 Essential (primary) hypertension: Secondary | ICD-10-CM | POA: Insufficient documentation

## 2019-11-11 DIAGNOSIS — Z01812 Encounter for preprocedural laboratory examination: Secondary | ICD-10-CM | POA: Insufficient documentation

## 2019-11-11 DIAGNOSIS — E039 Hypothyroidism, unspecified: Secondary | ICD-10-CM | POA: Insufficient documentation

## 2019-11-11 DIAGNOSIS — D649 Anemia, unspecified: Secondary | ICD-10-CM | POA: Insufficient documentation

## 2019-11-11 DIAGNOSIS — D508 Other iron deficiency anemias: Secondary | ICD-10-CM

## 2019-11-11 DIAGNOSIS — Z79899 Other long term (current) drug therapy: Secondary | ICD-10-CM | POA: Insufficient documentation

## 2019-11-11 LAB — COMPREHENSIVE METABOLIC PANEL
ALT: 20 U/L (ref 0–44)
AST: 28 U/L (ref 15–41)
Albumin: 3.3 g/dL — ABNORMAL LOW (ref 3.5–5.0)
Alkaline Phosphatase: 199 U/L — ABNORMAL HIGH (ref 38–126)
Anion gap: 10 (ref 5–15)
BUN: 12 mg/dL (ref 8–23)
CO2: 23 mmol/L (ref 22–32)
Calcium: 8.8 mg/dL — ABNORMAL LOW (ref 8.9–10.3)
Chloride: 105 mmol/L (ref 98–111)
Creatinine, Ser: 0.6 mg/dL (ref 0.44–1.00)
GFR calc Af Amer: >60 mL/min (ref >60)
GFR calc non Af Amer: >60 mL/min (ref >60)
Glucose, Bld: 97 mg/dL (ref 70–99)
Potassium: 4 mmol/L (ref 3.5–5.1)
Sodium: 138 mmol/L (ref 135–145)
Total Bilirubin: 0.5 mg/dL (ref 0.3–1.2)
Total Protein: 7.1 g/dL (ref 6.5–8.1)

## 2019-11-11 LAB — CBC
HCT: 33.8 % — ABNORMAL LOW (ref 36.0–46.0)
Hemoglobin: 9.8 g/dL — ABNORMAL LOW (ref 12.0–15.0)
MCH: 23.4 pg — ABNORMAL LOW (ref 26.0–34.0)
MCHC: 29 g/dL — ABNORMAL LOW (ref 30.0–36.0)
MCV: 80.9 fL (ref 80.0–100.0)
Platelets: 500 10*3/uL — ABNORMAL HIGH (ref 150–400)
RBC: 4.18 MIL/uL (ref 3.87–5.11)
RDW: 20.6 % — ABNORMAL HIGH (ref 11.5–15.5)
WBC: 7.6 10*3/uL (ref 4.0–10.5)
nRBC: 0 % (ref 0.0–0.2)

## 2019-11-11 LAB — SURGICAL PCR SCREEN
MRSA, PCR: NEGATIVE
Staphylococcus aureus: NEGATIVE

## 2019-11-11 MED ORDER — SODIUM CHLORIDE 0.9 % IV SOLN: Freq: Once | INTRAVENOUS | Status: AC

## 2019-11-11 MED ORDER — SODIUM CHLORIDE 0.9 % IV SOLN
510.0000 mg | Freq: Once | INTRAVENOUS | Status: AC
  Administered 2019-11-11: 510 mg via INTRAVENOUS
  Filled 2019-11-11: qty 510

## 2019-11-11 NOTE — Progress Notes (Signed)
Iron given per orders. Patient tolerated it well without problems. Vitals stable and discharged home from clinic ambulatory. Follow up as scheduled.  

## 2019-11-11 NOTE — Progress Notes (Addendum)
Anesthesia Chart Review   Case: [id number] Date/Time: 11/12/19 1245   Procedure: Revision Left Reverse Shoulder Arthroplasty with poly exchange SDD (Left Shoulder) - 132min -SDDC   Anesthesia type: General   Pre-op diagnosis: unstable left shoulder reverse arthroplasty   [facility] ROOM 06 / [facility]   Surgeons: Justice Britain, MD      DISCUSSION:68 y.o. never smoker with HTN, s/p liver transplant 2015, iron deficiency anemia, hypothyroidism, unstable left shoulder reverse arthroplasty scheduled for above procedure 11/12/2019 with Dr. Justice Britain.   Normal stress test 07/24/2011.   Pt received 1 unit packed rbc 10/29/2019 and is scheduled for second infusion 11/11/2019.  Hemoglobin 9.8 11/11/2019, improved from 7.4 10/22/2019.    Pt last seen by [facility] 09/16/2001. Per OV note, " 1. Status post liver transplant, left lobe living donor. The patient appears to be doing well from a allograft function perspective with the exception of her alkaline phosphatase which is been fluctuating throughout the course of the year. She had a prior biliary stricture identified, and I have arranged for her to undergo ERCP in follow-up which is scheduled for next week. Her transaminases and liver function otherwise remain quite normal on everolimus. I have been adjusting her everolimus dosing on a regular basis since her last telemedicine visit.  2. History of postoperative seizure disorder. Now off of her Keppra.  3. Hypertension. Well-controlled on current meds.  4. Lynch syndrome. Continues to get local follow-up with her primary gastroenterologist, and remains up-to-date in this regard. 5. Hypothyroidism. On replacement. 6. General health care. Reports influenza and pneumonia shots completed. Has very close dermatology follow-up. Appears to be developing recurrent iron deficiency, and he is scheduled to see hematology. I reviewed her MCV and platelet count with her today. I suggested that she could stop  her magnesium supplementation, as she is no longer on a calcineurin inhibitor, and magnesium levels have been normal on very low-dose supplements." S/p ERCP with biliary stent removal 11/03/2019.  Clearance from [facility] requested, pending.  Discussed with Dr. Nyoka Cowden.  Will continue to try to get input from [facility].  Anticipate pt can proceed with planned procedure barring acute status change.   VS: BP 122/68   Pulse (!) 16   Temp 36.8 C (Oral)   Resp 16   Ht 5\' 4"  (1.626 m)   Wt 62.6 kg   SpO2 100%   BMI 23.69 kg/m   PROVIDERS: [name], FNP is PCP   Laurier Nancy, MD with [facility] LABS: Labs reviewed: Acceptable for surgery. (all labs ordered are listed, but only abnormal results are displayed)  Labs Reviewed  CBC - Abnormal; Notable for the following components:      Result Value   Hemoglobin 9.8 (*)    HCT 33.8 (*)    MCH 23.4 (*)    MCHC 29.0 (*)    RDW 20.6 (*)    Platelets 500 (*)    All other components within normal limits  COMPREHENSIVE METABOLIC PANEL - Abnormal; Notable for the following components:   Calcium 8.8 (*)    Albumin 3.3 (*)    Alkaline Phosphatase 199 (*)    All other components within normal limits  SURGICAL PCR SCREEN     IMAGES:   EKG: 08/28/2019 Rate 65 bpm Sinus rhythm  Negative precordial T-waves -Probably normal -consider anteroseptal ischemia Rightward P/QRS axis and rotation-possible pulmonary disease  CV: TTE 08/16/2014 (Care everywhere):  Left Ventricle: Normal cavity size and wall thickness.  Ejection fraction  is 50 - 55%. Left ventricular systolic function is low normal.  Mild diastolic dysfunction.  Right Ventricle: Normal cavity size and ejection fraction. Past Medical History:  Diagnosis Date  . Anemia of chronic disease 10/26/2013  . Cancer ([place])   . Hernia of unspecified site of abdominal cavity without mention of obstruction or gangrene   . Hypertension   . Liver disease   . Lynch  syndrome   . Lynch syndrome   . Primary biliary cirrhosis ([place]) 10/26/2013  . Thyroid disease     Past Surgical History:  Procedure Laterality Date  . ABDOMINAL HERNIA REPAIR     Patient's states that she has had 8- 9 hernia surgeries  . ABDOMINAL HYSTERECTOMY    . CHOLECYSTECTOMY  2007  . COLON RESECTION    . COLON SURGERY  2008   Done at [facility]  . COLONOSCOPY     Done at [facility]  . ESOPHAGOGASTRODUODENOSCOPY N/A 08/21/2018   Procedure: ESOPHAGOGASTRODUODENOSCOPY (EGD);  Surgeon: Rogene Houston, MD;  [facility];  Service: Endoscopy;  Laterality: N/A;  . EYE SURGERY     lasix  . FLEXIBLE SIGMOIDOSCOPY N/A 10/20/2015   Procedure: FLEXIBLE SIGMOIDOSCOPY;  Surgeon: Rogene Houston, MD;  [facility];  Service: Endoscopy;  Laterality: N/A;  85 - Dr Laural Golden has meeting until 1:00  . FLEXIBLE SIGMOIDOSCOPY N/A 07/11/2016   Procedure: FLEXIBLE SIGMOIDOSCOPY;  Surgeon: Rogene Houston, MD;  [facility];  Service: Endoscopy;  Laterality: N/A;  1200  . FLEXIBLE SIGMOIDOSCOPY N/A 08/09/2017   Procedure: FLEXIBLE SIGMOIDOSCOPY;  Surgeon: Rogene Houston, MD;  [facility];  Service: Endoscopy;  Laterality: N/A;  1:00  . FLEXIBLE SIGMOIDOSCOPY N/A 08/21/2018   Procedure: FLEXIBLE SIGMOIDOSCOPY;  Surgeon: Rogene Houston, MD;  [facility];  Service: Endoscopy;  Laterality: N/A;  . FRACTURE SURGERY     right wrist metal plate  . HERNIA REPAIR    . LIVER TRANSPLANT  [id number]  . multiple skin cancers removed    . POLYPECTOMY  08/09/2017   Procedure: POLYPECTOMY;  Surgeon: Rogene Houston, MD;  [facility];  Service: Endoscopy;;  colon small bowel  . REVERSE SHOULDER ARTHROPLASTY Left 07/17/2018  . REVERSE SHOULDER ARTHROPLASTY Left 07/17/2018   Procedure: LEFT REVERSE SHOULDER ARTHROPLASTY;  Surgeon: Justice Britain, MD;  Location: [facility];  Service: Orthopedics;  Laterality: Left;  145min  . SHOULDER CLOSED REDUCTION Left 09/27/2019    Procedure: CLOSED REDUCTION SHOULDER;  Surgeon: Paralee Cancel, MD;  [facility];  Service: Orthopedics;  Laterality: Left;  . SPLENECTOMY  2006  . TYMPANOSTOMY TUBE PLACEMENT    . UPPER GASTROINTESTINAL ENDOSCOPY     Done at [facility]    MEDICATIONS: . acetaminophen (TYLENOL) 500 MG tablet  . alendronate (FOSAMAX) 70 MG tablet  . ALPRAZolam (XANAX) 0.5 MG tablet  . aspirin EC 81 MG tablet  . Biotin 10000 MCG TABS  . Calcium Citrate-Vitamin D (CALCIUM CITRATE + D3 PO)  . cetirizine (ZYRTEC) 10 MG tablet  . cyclobenzaprine (FLEXERIL) 5 MG tablet  . EPINEPHrine 0.3 mg/0.3 mL IJ SOAJ injection  . famotidine (PEPCID) 20 MG tablet  . fluorouracil (EFUDEX) 5 % cream  . HYDROcodone-acetaminophen (NORCO/VICODIN) 5-325 MG tablet  . hydrOXYzine (ATARAX/VISTARIL) 25 MG tablet  . levothyroxine (SYNTHROID) 125 MCG tablet  . losartan (COZAAR) 25 MG tablet  . metoprolol succinate (TOPROL XL) 25 MG 24 hr tablet  . ondansetron (ZOFRAN ODT) 4  MG disintegrating tablet  . pantoprazole (PROTONIX) 40 MG tablet  . Probiotic Product (PROBIOTIC-10 PO)  . ursodiol (ACTIGALL) 250 MG tablet  . valACYclovir (VALTREX) 500 MG tablet  . vitamin B-12 (CYANOCOBALAMIN) 1000 MCG tablet  . Vitamin D, Ergocalciferol, (DRISDOL) 1.25 MG (50000 UT) CAPS capsule  . ZORTRESS 0.5 MG TABS   No current facility-administered medications for this encounter.    Maia Plan [facility] Pre-Surgical Testing [phone] 11/11/19  12:37 PM

## 2019-11-12 ENCOUNTER — Inpatient Hospital Stay (HOSPITAL_COMMUNITY)
Admission: RE | Admit: 2019-11-12 | Discharge: 2019-11-12 | DRG: 483 | Disposition: A | Discharge status: Home / Self Care | Payer: Medicare Other | Attending: Orthopedic Surgery | Admitting: Orthopedic Surgery

## 2019-11-12 ENCOUNTER — Encounter (HOSPITAL_COMMUNITY)
Admission: RE | Disposition: A | Discharge status: Home / Self Care | Payer: Self-pay | Source: Home / Self Care | Attending: Orthopedic Surgery

## 2019-11-12 ENCOUNTER — Encounter (HOSPITAL_COMMUNITY): Payer: Self-pay | Admitting: Orthopedic Surgery

## 2019-11-12 ENCOUNTER — Inpatient Hospital Stay (HOSPITAL_COMMUNITY): Payer: Medicare Other | Admitting: Physician Assistant

## 2019-11-12 DIAGNOSIS — I10 Essential (primary) hypertension: Secondary | ICD-10-CM | POA: Diagnosis present

## 2019-11-12 DIAGNOSIS — Z20828 Contact with and (suspected) exposure to other viral communicable diseases: Secondary | ICD-10-CM | POA: Diagnosis present

## 2019-11-12 DIAGNOSIS — Z85828 Personal history of other malignant neoplasm of skin: Secondary | ICD-10-CM | POA: Diagnosis not present

## 2019-11-12 DIAGNOSIS — Z7983 Long term (current) use of bisphosphonates: Secondary | ICD-10-CM

## 2019-11-12 DIAGNOSIS — T84028A Dislocation of other internal joint prosthesis, initial encounter: Principal | ICD-10-CM | POA: Diagnosis present

## 2019-11-12 DIAGNOSIS — Z1509 Genetic susceptibility to other malignant neoplasm: Secondary | ICD-10-CM | POA: Diagnosis not present

## 2019-11-12 DIAGNOSIS — M545 Low back pain, unspecified: Secondary | ICD-10-CM

## 2019-11-12 DIAGNOSIS — M25312 Other instability, left shoulder: Secondary | ICD-10-CM | POA: Diagnosis present

## 2019-11-12 DIAGNOSIS — E079 Disorder of thyroid, unspecified: Secondary | ICD-10-CM | POA: Diagnosis present

## 2019-11-12 DIAGNOSIS — Z79899 Other long term (current) drug therapy: Secondary | ICD-10-CM | POA: Diagnosis not present

## 2019-11-12 DIAGNOSIS — Z96612 Presence of left artificial shoulder joint: Secondary | ICD-10-CM

## 2019-11-12 DIAGNOSIS — Z944 Liver transplant status: Secondary | ICD-10-CM | POA: Diagnosis not present

## 2019-11-12 DIAGNOSIS — Z7982 Long term (current) use of aspirin: Secondary | ICD-10-CM

## 2019-11-12 HISTORY — PX: TOTAL SHOULDER REVISION: SHX6130

## 2019-11-12 SURGERY — REVISION, TOTAL ARTHROPLASTY, SHOULDER
Anesthesia: General | Site: Shoulder | Laterality: Left

## 2019-11-12 MED ORDER — BUPIVACAINE HCL (PF) 0.5 % IJ SOLN
INTRAMUSCULAR | Status: DC | PRN
  Administered 2019-11-12: 15 mg via PERINEURAL

## 2019-11-12 MED ORDER — CHLORHEXIDINE GLUCONATE 4 % EX LIQD: 60.0000 mL | Freq: Once | CUTANEOUS | Status: DC

## 2019-11-12 MED ORDER — ONDANSETRON HCL 4 MG PO TABS: 4.0000 mg | ORAL_TABLET | Freq: Three times a day (TID) | ORAL | 0 refills | Status: DC | PRN

## 2019-11-12 MED ORDER — LIDOCAINE 2% (20 MG/ML) 5 ML SYRINGE
INTRAMUSCULAR | Status: DC | PRN
  Administered 2019-11-12: 40 mg via INTRAVENOUS

## 2019-11-12 MED ORDER — LIDOCAINE 2% (20 MG/ML) 5 ML SYRINGE
INTRAMUSCULAR | Status: AC
  Filled 2019-11-12: qty 5

## 2019-11-12 MED ORDER — ONDANSETRON HCL 4 MG/2ML IJ SOLN
INTRAMUSCULAR | Status: DC | PRN
  Administered 2019-11-12: 4 mg via INTRAVENOUS

## 2019-11-12 MED ORDER — TRANEXAMIC ACID-NACL 1000-0.7 MG/100ML-% IV SOLN
1000.0000 mg | INTRAVENOUS | Status: AC
  Administered 2019-11-12: 1000 mg via INTRAVENOUS
  Filled 2019-11-12: qty 100

## 2019-11-12 MED ORDER — EPHEDRINE SULFATE-NACL 50-0.9 MG/10ML-% IV SOSY
PREFILLED_SYRINGE | INTRAVENOUS | Status: DC | PRN
  Administered 2019-11-12 (×2): 10 mg via INTRAVENOUS

## 2019-11-12 MED ORDER — PROPOFOL 10 MG/ML IV BOLUS
INTRAVENOUS | Status: DC | PRN
  Administered 2019-11-12: 170 mg via INTRAVENOUS

## 2019-11-12 MED ORDER — CELECOXIB 200 MG PO CAPS
400.0000 mg | ORAL_CAPSULE | Freq: Once | ORAL | Status: AC
  Administered 2019-11-12: 400 mg via ORAL
  Filled 2019-11-12: qty 2

## 2019-11-12 MED ORDER — ONDANSETRON HCL 4 MG/2ML IJ SOLN
INTRAMUSCULAR | Status: AC
  Filled 2019-11-12: qty 2

## 2019-11-12 MED ORDER — CEFAZOLIN SODIUM-DEXTROSE 2-4 GM/100ML-% IV SOLN
2.0000 g | INTRAVENOUS | Status: AC
  Administered 2019-11-12: 2 g via INTRAVENOUS
  Filled 2019-11-12: qty 100

## 2019-11-12 MED ORDER — ROCURONIUM BROMIDE 10 MG/ML (PF) SYRINGE
PREFILLED_SYRINGE | INTRAVENOUS | Status: AC
  Filled 2019-11-12: qty 10

## 2019-11-12 MED ORDER — PROPOFOL 10 MG/ML IV BOLUS
INTRAVENOUS | Status: AC
  Filled 2019-11-12: qty 20

## 2019-11-12 MED ORDER — LACTATED RINGERS IV SOLN: INTRAVENOUS | Status: DC

## 2019-11-12 MED ORDER — BUPIVACAINE LIPOSOME 1.3 % IJ SUSP
INTRAMUSCULAR | Status: DC | PRN
  Administered 2019-11-12: 10 mg via PERINEURAL

## 2019-11-12 MED ORDER — FENTANYL CITRATE (PF) 100 MCG/2ML IJ SOLN
50.0000 ug | Freq: Once | INTRAMUSCULAR | Status: AC
  Administered 2019-11-12: 100 ug via INTRAVENOUS
  Filled 2019-11-12: qty 2

## 2019-11-12 MED ORDER — PROMETHAZINE HCL 25 MG/ML IJ SOLN: 6.2500 mg | INTRAMUSCULAR | Status: DC | PRN

## 2019-11-12 MED ORDER — DEXAMETHASONE SODIUM PHOSPHATE 10 MG/ML IJ SOLN
INTRAMUSCULAR | Status: AC
  Filled 2019-11-12: qty 1

## 2019-11-12 MED ORDER — ACETAMINOPHEN 500 MG PO TABS
1000.0000 mg | ORAL_TABLET | Freq: Once | ORAL | Status: AC
  Administered 2019-11-12: 1000 mg via ORAL
  Filled 2019-11-12: qty 2

## 2019-11-12 MED ORDER — ROCURONIUM BROMIDE 10 MG/ML (PF) SYRINGE
PREFILLED_SYRINGE | INTRAVENOUS | Status: DC | PRN
  Administered 2019-11-12: 50 mg via INTRAVENOUS

## 2019-11-12 MED ORDER — CYCLOBENZAPRINE HCL 5 MG PO TABS: 5.0000 mg | ORAL_TABLET | Freq: Three times a day (TID) | ORAL | 1 refills | Status: DC | PRN

## 2019-11-12 MED ORDER — OXYCODONE-ACETAMINOPHEN 5-325 MG PO TABS: 1.0000 | ORAL_TABLET | ORAL | 0 refills | Status: DC | PRN

## 2019-11-12 MED ORDER — SCOPOLAMINE 1 MG/3DAYS TD PT72
1.0000 | MEDICATED_PATCH | TRANSDERMAL | Status: DC
  Administered 2019-11-12: 1.5 mg via TRANSDERMAL
  Filled 2019-11-12: qty 1

## 2019-11-12 MED ORDER — 0.9 % SODIUM CHLORIDE (POUR BTL) OPTIME
TOPICAL | Status: DC | PRN
  Administered 2019-11-12: 1000 mL

## 2019-11-12 MED ORDER — STERILE WATER FOR IRRIGATION IR SOLN
Status: DC | PRN
  Administered 2019-11-12: 2000 mL

## 2019-11-12 MED ORDER — SUGAMMADEX SODIUM 500 MG/5ML IV SOLN
INTRAVENOUS | Status: DC | PRN
  Administered 2019-11-12: 150 mg via INTRAVENOUS

## 2019-11-12 MED ORDER — MIDAZOLAM HCL 2 MG/2ML IJ SOLN
1.0000 mg | Freq: Once | INTRAMUSCULAR | Status: AC
  Administered 2019-11-12: 12:00:00 2 mg via INTRAVENOUS
  Filled 2019-11-12: qty 2

## 2019-11-12 MED ORDER — FENTANYL CITRATE (PF) 100 MCG/2ML IJ SOLN: 25.0000 ug | INTRAMUSCULAR | Status: DC | PRN

## 2019-11-12 MED ORDER — DEXAMETHASONE SODIUM PHOSPHATE 10 MG/ML IJ SOLN
INTRAMUSCULAR | Status: DC | PRN
  Administered 2019-11-12: 10 mg via INTRAVENOUS

## 2019-11-12 SURGICAL SUPPLY — 56 items
BAG ZIPLOCK 12X15 (MISCELLANEOUS) ×2 IMPLANT
BLADE SAW SGTL 83.5X18.5 (BLADE) IMPLANT
CHLORAPREP W/TINT 26 (MISCELLANEOUS) ×2 IMPLANT
COOLER ICEMAN CLASSIC (MISCELLANEOUS) IMPLANT
COVER SURGICAL LIGHT HANDLE (MISCELLANEOUS) ×2 IMPLANT
COVER WAND RF STERILE (DRAPES) IMPLANT
DERMABOND ADVANCED (GAUZE/BANDAGES/DRESSINGS) ×1
DERMABOND ADVANCED .7 DNX12 (GAUZE/BANDAGES/DRESSINGS) ×1 IMPLANT
DRAPE INCISE IOBAN 66X45 STRL (DRAPES) IMPLANT
DRAPE ORTHO SPLIT 77X108 STRL (DRAPES) ×2
DRAPE SURG 17X11 SM STRL (DRAPES) ×2 IMPLANT
DRAPE SURG ORHT 6 SPLT 77X108 (DRAPES) ×2 IMPLANT
DRAPE U-SHAPE 47X51 STRL (DRAPES) ×2 IMPLANT
DRESSING AQUACEL AG SP 3.5X10 (GAUZE/BANDAGES/DRESSINGS) ×1 IMPLANT
DRSG AQUACEL AG ADV 3.5X10 (GAUZE/BANDAGES/DRESSINGS) ×2 IMPLANT
DRSG AQUACEL AG SP 3.5X10 (GAUZE/BANDAGES/DRESSINGS) ×2
DURAPREP 26ML APPLICATOR (WOUND CARE) ×4 IMPLANT
ELECT BLADE TIP CTD 4 INCH (ELECTRODE) ×2 IMPLANT
ELECT REM PT RETURN 15FT ADLT (MISCELLANEOUS) ×2 IMPLANT
FACESHIELD WRAPAROUND (MASK) ×6 IMPLANT
GLOVE BIO SURGEON STRL SZ7.5 (GLOVE) ×2 IMPLANT
GLOVE BIO SURGEON STRL SZ8 (GLOVE) ×2 IMPLANT
GLOVE SS BIOGEL STRL SZ 7 (GLOVE) ×1 IMPLANT
GLOVE SS BIOGEL STRL SZ 7.5 (GLOVE) ×1 IMPLANT
GLOVE SUPERSENSE BIOGEL SZ 7 (GLOVE) ×1
GLOVE SUPERSENSE BIOGEL SZ 7.5 (GLOVE) ×1
GOWN STRL REUS W/TWL LRG LVL3 (GOWN DISPOSABLE) ×4 IMPLANT
INSERT HUMERAL UNI REVERS 36 3 (Insert) ×2 IMPLANT
KIT BASIN OR (CUSTOM PROCEDURE TRAY) ×2 IMPLANT
KIT TURNOVER KIT A (KITS) IMPLANT
MANIFOLD NEPTUNE II (INSTRUMENTS) ×2 IMPLANT
NEEDLE TAPERED W/ NITINOL LOOP (MISCELLANEOUS) IMPLANT
NS IRRIG 1000ML POUR BTL (IV SOLUTION) ×2 IMPLANT
PACK SHOULDER (CUSTOM PROCEDURE TRAY) ×2 IMPLANT
PAD ARMBOARD 7.5X6 YLW CONV (MISCELLANEOUS) ×4 IMPLANT
PAD COLD SHLDR WRAP-ON (PAD) ×2 IMPLANT
PROTECTOR NERVE ULNAR (MISCELLANEOUS) ×2 IMPLANT
RESTRAINT HEAD UNIVERSAL NS (MISCELLANEOUS) ×2 IMPLANT
SLING ARM FOAM STRAP LRG (SOFTGOODS) IMPLANT
SLING ARM FOAM STRAP MED (SOFTGOODS) ×4 IMPLANT
SPACER SHLD UNI REV 36 +6 (Shoulder) ×2 IMPLANT
SPONGE LAP 18X18 RF (DISPOSABLE) IMPLANT
SUCTION FRAZIER HANDLE 12FR (TUBING) ×1
SUCTION TUBE FRAZIER 12FR DISP (TUBING) ×1 IMPLANT
SUT MNCRL AB 3-0 PS2 18 (SUTURE) ×2 IMPLANT
SUT MON AB 2-0 CT1 36 (SUTURE) ×2 IMPLANT
SUT VIC AB 1 CT1 36 (SUTURE) ×2 IMPLANT
SUT VIC AB 2-0 CT1 27 (SUTURE) ×1
SUT VIC AB 2-0 CT1 TAPERPNT 27 (SUTURE) ×1 IMPLANT
SUTURE TAPE 1.3 40 TPR END (SUTURE) IMPLANT
SUTURETAPE 1.3 40 TPR END (SUTURE)
SWAB COLLECTION DEVICE MRSA (MISCELLANEOUS) IMPLANT
SWAB CULTURE ESWAB REG 1ML (MISCELLANEOUS) IMPLANT
TOWEL OR 17X26 10 PK STRL BLUE (TOWEL DISPOSABLE) ×2 IMPLANT
TOWEL OR NON WOVEN STRL DISP B (DISPOSABLE) ×2 IMPLANT
WATER STERILE IRR 1000ML POUR (IV SOLUTION) ×2 IMPLANT

## 2019-11-12 NOTE — Anesthesia Procedure Notes (Signed)
Anesthesia Regional Block: Interscalene brachial plexus block   Pre-Anesthetic Checklist: ,, timeout performed, Correct Patient, Correct Site, Correct Laterality, Correct Procedure, Correct Position, site marked, Risks and benefits discussed,  Surgical consent,  Pre-op evaluation,  At surgeon's request and post-op pain management  Laterality: Left  Prep: chloraprep       Needles:  Injection technique: Single-shot  Needle Type: Echogenic Stimulator Needle     Needle Length: 5cm  Needle Gauge: 22     Additional Needles:   Narrative:  Start time: 11/12/2019 12:23 PM End time: 11/12/2019 12:33 PM Injection made incrementally with aspirations every 5 mL.  Performed by: Personally  Anesthesiologist: Duane Boston, MD  Additional Notes: Functioning IV was confirmed and monitors applied.  A 26mm 22ga echogenic arrow stimulator was used. Sterile prep and drape,hand hygiene and sterile gloves were used.Ultrasound guidance: relevant anatomy identified, needle position confirmed, local anesthetic spread visualized around nerve(s)., vascular puncture avoided.  Image printed for medical record.  Negative aspiration and negative test dose prior to incremental administration of local anesthetic. The patient tolerated the procedure well.

## 2019-11-12 NOTE — Transfer of Care (Signed)
Immediate Anesthesia Transfer of Care Note  Patient: Cassidy Barber  Procedure(s) Performed: Revision Left Reverse Shoulder Arthroplasty with poly exchange SDD (Left Shoulder)  Patient Location: PACU  Anesthesia Type:General  Level of Consciousness: awake, alert , oriented and patient cooperative  Airway & Oxygen Therapy: Patient Spontanous Breathing and Patient connected to face mask oxygen  Post-op Assessment: Report given to RN, Post -op Vital signs reviewed and stable and Patient moving all extremities  Post vital signs: Reviewed and stable  Last Vitals:  Vitals Value Taken Time  BP 158/94 11/12/19 1456  Temp    Pulse 80 11/12/19 1458  Resp 17 11/12/19 1458  SpO2 100 % 11/12/19 1458  Vitals shown include unvalidated device data.  Last Pain:  Vitals:   11/12/19 1121  TempSrc: Oral         Complications: No apparent anesthesia complications

## 2019-11-12 NOTE — Evaluation (Signed)
Occupational Therapy Evaluation Patient Details Name: Cassidy Barber MRN: [id number] DOB: [birth year] Today's Date: 11/12/2019    History of Present Illness s/p L RTSA poly exchange   Clinical Impression   Reviewed education with pt/husband and educated on ice machine, which is new to them.  Both verbalize understanding.  Handouts given; pt will follow up with Dr Onnie Graham for further rehab    Follow Up Recommendations  Follow surgeon's recommendation for DC plan and follow-up therapies    Equipment Recommendations  None recommended by OT    Recommendations for Other Services       Precautions / Restrictions Precautions Precautions: Shoulder Type of Shoulder Precautions: may come out of sling in controlled environment.  May use L UE during adls A/AA/PROM 20 ER, 45 ABD and 60 FF, may perform gentle pendulums and lap slides.  May move elbow wrist and hand Shoulder Interventions: Shoulder sling/immobilizer Precaution Booklet Issued: Yes (comment) Restrictions Other Position/Activity Restrictions: NWB LUE      Mobility Bed Mobility                  Transfers                 General transfer comment: min guard to stand for safety    Balance                                           ADL either performed or assessed with clinical judgement   ADL Overall ADL's : Needs assistance/impaired Eating/Feeding: Set up   Grooming: Set up   Upper Body Bathing: Moderate assistance   Lower Body Bathing: Minimal assistance   Upper Body Dressing : Moderate assistance   Lower Body Dressing: Moderate assistance                 General ADL Comments: assisted pt with dressing, took a few steps forward and backwards in room, educated pt/husband on protocol and ice machine. They verbalize understanding. Did not do exercises due to block     Vision         Perception     Praxis      Pertinent Vitals/Pain Pain Assessment: No/denies pain(block  in effect)     Hand Dominance Right   Extremity/Trunk Assessment Upper Extremity Assessment Upper Extremity Assessment: LUE deficits/detail(can move fingers)           Communication Communication Communication: No difficulties   Cognition Arousal/Alertness: Awake/alert Behavior During Therapy: WFL for tasks assessed/performed Overall Cognitive Status: Within Functional Limits for tasks assessed                                     General Comments       Exercises     Shoulder Instructions      Home Living Family/patient expects to be discharged to:: Private residence Living Arrangements: Spouse/significant other                     Bathroom Toilet: Standard                Prior Functioning/Environment Level of Independence: Independent                 OT Problem List:        OT Treatment/Interventions:  OT Goals(Current goals can be found in the care plan section) Acute Rehab OT Goals OT Goal Formulation: Patient unable to participate in goal setting  OT Frequency:     Barriers to D/C:            Co-evaluation              AM-PAC OT "6 Clicks" Daily Activity     Outcome Measure Help from another person eating meals?: A Little Help from another person taking care of personal grooming?: A Little Help from another person toileting, which includes using toliet, bedpan, or urinal?: A Little Help from another person bathing (including washing, rinsing, drying)?: A Little Help from another person to put on and taking off regular upper body clothing?: A Lot Help from another person to put on and taking off regular lower body clothing?: A Lot 6 Click Score: 16   End of Session Nurse Communication: (finished with OT)  Activity Tolerance: Patient tolerated treatment well Patient left: in chair;with call bell/phone within reach  OT Visit Diagnosis: Muscle weakness (generalized) (M62.81)                Time:  [id number] OT Time Calculation (min): 22 min Charges:  OT General Charges $OT Visit: 1 Visit OT Evaluation $OT Eval Low Complexity: 1 Low  Cassidy Barber S, OTR/L Acute Rehabilitation Services 11/12/2019  [place] 11/12/2019, 4:45 PM

## 2019-11-12 NOTE — Anesthesia Procedure Notes (Signed)
Procedure Name: Intubation Date/Time: 11/12/2019 1:46 PM Performed by: Gerald Leitz, CRNA Pre-anesthesia Checklist: Patient identified, Patient being monitored, Timeout performed, Emergency Drugs available and Suction available Patient Re-evaluated:Patient Re-evaluated prior to induction Oxygen Delivery Method: Circle system utilized Preoxygenation: Pre-oxygenation with 100% oxygen Induction Type: IV induction Ventilation: Mask ventilation without difficulty Laryngoscope Size: Mac and 3 Grade View: Grade I Tube type: Oral Tube size: 7.0 mm Number of attempts: 1 Placement Confirmation: ETT inserted through vocal cords under direct vision,  positive ETCO2 and breath sounds checked- equal and bilateral Secured at: 21 cm Tube secured with: Tape Dental Injury: Teeth and Oropharynx as per pre-operative assessment

## 2019-11-12 NOTE — H&P (Signed)
Cassidy Barber    Chief Complaint: unstable left shoulder reverse arthroplasty HPI: The patient is a 68 y.o. female status post a left shoulder reverse arthroplasty that I performed in September 2019.  She initially did well but unfortunately she had an instability episode several months ago requiring a closed reduction in the operating room.  Following this episode she had been protected in a sling.  Unfortunately she had another instability episode with simply trivial movement of the shoulder.  This occurred last weekend.  She is now to the point that she is significantly symptomatic and functionally impaired by her shoulder instability and she is brought to the operating room this time for planned revision of her reverse implant with anticipated utilization of a constrained liner and potentially additional implant spacers.  Past Medical History:  Diagnosis Date  . Anemia of chronic disease 10/26/2013  . Cancer ([place])   . Hernia of unspecified site of abdominal cavity without mention of obstruction or gangrene   . Hypertension   . Liver disease   . Lynch syndrome   . Lynch syndrome   . Primary biliary cirrhosis ([place]) 10/26/2013  . Thyroid disease     Past Surgical History:  Procedure Laterality Date  . ABDOMINAL HERNIA REPAIR     Patient's states that she has had 8- 9 hernia surgeries  . ABDOMINAL HYSTERECTOMY    . CHOLECYSTECTOMY  2007  . COLON RESECTION    . COLON SURGERY  2008   Done at [facility]  . COLONOSCOPY     Done at [facility]  . ESOPHAGOGASTRODUODENOSCOPY N/A 08/21/2018   Procedure: ESOPHAGOGASTRODUODENOSCOPY (EGD);  Surgeon: Rogene Houston, MD;  [facility];  Service: Endoscopy;  Laterality: N/A;  . EYE SURGERY     lasix  . FLEXIBLE SIGMOIDOSCOPY N/A 10/20/2015   Procedure: FLEXIBLE SIGMOIDOSCOPY;  Surgeon: Rogene Houston, MD;  [facility];  Service: Endoscopy;  Laterality: N/A;  4 - Dr Laural Golden has meeting until 1:00  . FLEXIBLE SIGMOIDOSCOPY N/A 07/11/2016    Procedure: FLEXIBLE SIGMOIDOSCOPY;  Surgeon: Rogene Houston, MD;  [facility];  Service: Endoscopy;  Laterality: N/A;  1200  . FLEXIBLE SIGMOIDOSCOPY N/A 08/09/2017   Procedure: FLEXIBLE SIGMOIDOSCOPY;  Surgeon: Rogene Houston, MD;  [facility];  Service: Endoscopy;  Laterality: N/A;  1:00  . FLEXIBLE SIGMOIDOSCOPY N/A 08/21/2018   Procedure: FLEXIBLE SIGMOIDOSCOPY;  Surgeon: Rogene Houston, MD;  [facility];  Service: Endoscopy;  Laterality: N/A;  . FRACTURE SURGERY     right wrist metal plate  . HERNIA REPAIR    . LIVER TRANSPLANT  [id number]  . multiple skin cancers removed    . POLYPECTOMY  08/09/2017   Procedure: POLYPECTOMY;  Surgeon: Rogene Houston, MD;  [facility];  Service: Endoscopy;;  colon small bowel  . REVERSE SHOULDER ARTHROPLASTY Left 07/17/2018  . REVERSE SHOULDER ARTHROPLASTY Left 07/17/2018   Procedure: LEFT REVERSE SHOULDER ARTHROPLASTY;  Surgeon: Justice Britain, MD;  Location: [place];  Service: Orthopedics;  Laterality: Left;  185min  . SHOULDER CLOSED REDUCTION Left 09/27/2019   Procedure: CLOSED REDUCTION SHOULDER;  Surgeon: Paralee Cancel, MD;  Location: [facility];  Service: Orthopedics;  Laterality: Left;  . SPLENECTOMY  2006  . TYMPANOSTOMY TUBE PLACEMENT    . UPPER GASTROINTESTINAL ENDOSCOPY     Done at [facility]    Family History  Problem Relation Age of Onset  . Prostate cancer Father   . Colon cancer  Father   . Colon cancer Sister   . Lung cancer Sister   . Healthy Son   . Alcohol abuse Brother   . Allergic rhinitis Neg Hx   . Asthma Neg Hx   . Eczema Neg Hx   . Urticaria Neg Hx     Social History:  reports that she has never smoked. She has never used smokeless tobacco. She reports that she does not drink alcohol or use drugs.   Medications Prior to Admission  Medication Sig Dispense Refill  . acetaminophen (TYLENOL) 500 MG tablet Take 1,000 mg by mouth every 6 (six) hours as needed (for pain.).     [name]  alendronate (FOSAMAX) 70 MG tablet Take 1 tablet (70 mg total) by mouth once a week. Take with a full glass of water on an empty stomach. 12 tablet 0  . ALPRAZolam (XANAX) 0.5 MG tablet Take 1 tablet (0.5 mg total) by mouth at bedtime. 90 tablet 1  . aspirin EC 81 MG tablet Take 81 mg by mouth daily.    . Biotin 10000 MCG TABS Take 10,000 mcg by mouth daily.    . Calcium Citrate-Vitamin D (CALCIUM CITRATE + D3 PO) Take 1 tablet by mouth 2 (two) times daily.    . cetirizine (ZYRTEC) 10 MG tablet Take 1-2 tablets 2 times a day max 40mg  (Patient taking differently: Take 10 mg by mouth 2 (two) times daily. ) 120 tablet 5  . cyclobenzaprine (FLEXERIL) 5 MG tablet Take 1 tablet (5 mg total) by mouth 3 (three) times daily as needed for muscle spasms. (Patient taking differently: Take 5 mg by mouth 2 (two) times daily as needed for muscle spasms. ) 60 tablet 1  . EPINEPHrine 0.3 mg/0.3 mL IJ SOAJ injection Use as directed for severe allergic reaction (Patient taking differently: Inject 0.3 mg into the muscle as needed for anaphylaxis. ) 2 Device 2  . famotidine (PEPCID) 20 MG tablet Take 1 tablet (20 mg total) by mouth daily. Take 1 tablet twice a day 180 tablet 1  . fluorouracil (EFUDEX) 5 % cream Apply 1 application topically daily as needed (cancer spots).   0  . levothyroxine (SYNTHROID) 125 MCG tablet Take 1 tablet (125 mcg total) by mouth daily. (Patient taking differently: Take 125 mcg by mouth daily before breakfast. ) 30 tablet 0  . losartan (COZAAR) 25 MG tablet TAKE ONE (1) TABLET EACH DAY (Patient taking differently: Take 25 mg by mouth daily. ) 90 tablet 2  . metoprolol succinate (TOPROL XL) 25 MG 24 hr tablet Take 1 tablet (25 mg total) by mouth daily. 30 tablet 11  . pantoprazole (PROTONIX) 40 MG tablet Take 1 tablet (40 mg total) by mouth 2 (two) times daily. 180 tablet 1  . Probiotic Product (PROBIOTIC-10 PO) Take 1 capsule by mouth daily.     . ursodiol (ACTIGALL) 250 MG tablet Take 250 mg  by mouth 2 (two) times daily.     . valACYclovir (VALTREX) 500 MG tablet TAKE ONE (1) TABLET THREE (3) TIMES EACH DAY (Patient taking differently: Take 500 mg by mouth 2 (two) times daily. ) 30 tablet 4  . vitamin B-12 (CYANOCOBALAMIN) 1000 MCG tablet Take 1,000 mcg by mouth daily.    . Vitamin D, Ergocalciferol, (DRISDOL) 1.25 MG (50000 UT) CAPS capsule Take 1 capsule (50,000 Units total) by mouth once a week. (Patient taking differently: Take 50,000 Units by mouth every Thursday. ) 12 capsule 2  . ZORTRESS 0.5 MG TABS Take 2  mg by mouth 2 (two) times daily.     [name] HYDROcodone-acetaminophen (NORCO/VICODIN) 5-325 MG tablet Take 1 tablet by mouth every 6 (six) hours as needed. (Patient not taking: Reported on 10/29/2019) 14 tablet 0  . hydrOXYzine (ATARAX/VISTARIL) 25 MG tablet TAKE 1 TABLET THREE TIMES DAILY AS NEEDED FOR ITCHING (Patient taking differently: Take 25 mg by mouth 3 (three) times daily as needed for itching. ) 60 tablet 5  . ondansetron (ZOFRAN ODT) 4 MG disintegrating tablet Take 1 tablet (4 mg total) by mouth every 8 (eight) hours as needed. (Patient not taking: Reported on 10/29/2019) 10 tablet 1     Physical Exam: Inspection of the left shoulder demonstrates that her surgical incision is well-healed.  There is no ecchymosis or erythema.  She is neurovascular intact distally.  Minimal pain with gentle passive motion.  Her recent plain films are reviewed and these show that her implant remains in overall good position alignment without obvious evidence for loosening or migration.  Vitals  Temp:  [97.8 F (36.6 C)-98.6 F (37 C)] 98.6 F (37 C) (12/31 1121) Pulse Rate:  [62-72] 66 (12/31 1235) Resp:  [14-18] 18 (12/31 1235) BP: (116-148)/(59-73) 121/73 (12/31 1235) SpO2:  [99 %-100 %] 100 % (12/31 1235) Weight:  [61.7 kg] 61.7 kg (12/31 1121)  Assessment/Plan  Impression: unstable left shoulder reverse arthroplasty  Plan of Action: Procedure(s): Revision Left Reverse  Shoulder Arthroplasty with poly exchange SDD  Sahir Tolson M Davionte Lusby 11/12/2019, 12:46 PM Contact # [phone]

## 2019-11-12 NOTE — Anesthesia Postprocedure Evaluation (Signed)
Anesthesia Post Note  Patient: Cassidy Barber  Procedure(s) Performed: Revision Left Reverse Shoulder Arthroplasty with poly exchange SDD (Left Shoulder)     Patient location during evaluation: PACU Anesthesia Type: General Level of consciousness: sedated Pain management: pain level controlled Vital Signs Assessment: post-procedure vital signs reviewed and stable Respiratory status: spontaneous breathing and respiratory function stable Cardiovascular status: stable Postop Assessment: no apparent nausea or vomiting Anesthetic complications: no    Last Vitals:  Vitals:   11/12/19 1600 11/12/19 1615  BP: (!) 147/77 124/87  Pulse: (!) 59   Resp: 18 20  Temp: (!) 36.4 C (!) 36.3 C  SpO2: 94% 92%    Last Pain:  Vitals:   11/12/19 1121  TempSrc: Oral                 Keltie Labell DANIEL

## 2019-11-12 NOTE — Discharge Instructions (Signed)
Metta Clines. Supple, M.D., F.A.A.O.S. [facility]opa[facility] Specializing in Arthroscopic and Reconstructive Surgery of the Shoulder [phone] [address] [place], [place] [address] - Fax [phone]   POST-OP TOTAL SHOULDER REPLACEMENT INSTRUCTIONS  1. Call the office at [phone] to schedule your first post-op appointment 10-14 days from the date of your surgery.  2. The bandage over your incision is waterproof. You may begin showering with this dressing on. You may leave this dressing on until first follow up appointment within 2 weeks. We prefer you leave this dressing in place until follow up however after 5-7 days if you are having itching or skin irritation and would like to remove it you may do so. Go slow and tug at the borders gently to break the bond the dressing has with the skin. At this point if there is no drainage it is okay to go without a bandage or you may cover it with a light guaze and tape. You can also expect significant bruising around your shoulder that will drift down your arm and into your chest wall. This is very normal and should resolve over several days.   3. Wear your sling/immobilizer at all times except to perform the exercises below or to occasionally let your arm dangle by your side to stretch your elbow. You also need to sleep in your sling immobilizer until instructed otherwise. It is ok to remove your sling if you are sitting in a controlled environment and allow your arm to rest in a position of comfort by your side or on your lap with pillows to give your neck and skin a break from the sling. You may remove it to allow arm to dangle by side to shower. If you are up walking around and when you go to sleep at night you need to wear it.  4. Range of motion to your elbow, wrist, and hand are encouraged 3-5 times daily. Exercise to your hand and fingers helps to reduce swelling you may experience.  5. Utilize ice to the shoulder 3-5 times  minimum a day and additionally if you are experiencing pain.  6. Prescriptions for a pain medication and a muscle relaxant are provided for you. It is recommended that if you are experiencing pain that you pain medication alone is not controlling, add the muscle relaxant along with the pain medication which can give additional pain relief. The first 1-2 days is generally the most severe of your pain and then should gradually decrease. As your pain lessens it is recommended that you decrease your use of the pain medications to an "as needed basis'" only and to always comply with the recommended dosages of the pain medications.  7. Pain medications can produce constipation along with their use. If you experience this, the use of an over the counter stool softener or laxative daily is recommended.   8. For additional questions or concerns, please do not hesitate to call the office. If after hours there is an answering service to forward your concerns to the physician on call.  9.Pain control following an exparel block  To help control your post-operative pain you received a nerve block  performed with Exparel which is a long acting anesthetic (numbing agent) which can provide pain relief and sensations of numbness (and relief of pain) in the operative shoulder and arm for up to 3 days. Sometimes it provides mixed relief, meaning you may still have numbness in certain areas of the arm but can still  be able to move  parts of that arm, hand, and fingers. We recommend that your prescribed pain medications  be used as needed. We do not feel it is necessary to "pre medicate" and "stay ahead" of pain.  Taking narcotic pain medications when you are not having any pain can lead to unnecessary and potentially dangerous side effects.    10. Use the ice machine as much as possible in the first 5-7 days from surgery, then you can wean its use to as needed. The ice typically needs to be replaced every 6 hours, instead of  ice you can actually freeze water bottles to put in the cooler and then fill water around them to avoid having to purchase ice. You can have spare water bottles freezing to allow you to rotate them once they have melted. Try to have a thin shirt or light cloth or towel under the ice wrap to protect your skin.   11.  We recommend that you avoid any dental work or cleaning in the first 3 months following your joint replacement. This is to help minimize the possibility of infection from the bacteria in your mouth that enters your bloodstream during dental work. We also recommend that you take an antibiotic prior to your dental work for the first year after your shoulder replacement to further help reduce that risk. Please simply contact our office for antibiotics to be sent to your pharmacy prior to dental work.  POST-OP EXERCISES  Pendulum Exercises  Perform pendulum exercises while standing and bending at the waist. Support your uninvolved arm on a table or chair and allow your operated arm to hang freely. Make sure to do these exercises passively - not using you shoulder muscles. These exercises can be performed once your nerve block effects have worn off.  Repeat 20 times. Do 3 sessions per day.

## 2019-11-12 NOTE — Progress Notes (Signed)
Assisted Dr. Singer with left, ultrasound guided, interscalene  block. Side rails up, monitors on throughout procedure. See vital signs in flow sheet. Tolerated Procedure well.  

## 2019-11-12 NOTE — Op Note (Signed)
11/12/2019  2:38 PM  PATIENT:   Cassidy Barber  68 y.o. female  PRE-OPERATIVE DIAGNOSIS:  unstable left shoulder reverse arthroplasty  POST-OPERATIVE DIAGNOSIS: Same  PROCEDURE: Revision of left shoulder reverse arthroplasty in the form of the placement of a +6 spacer and a +3 constrained polyethylene insert  SURGEON:  Deklyn Gibbon, Metta Clines M.D.  ASSISTANTS: Jenetta Loges, PA-C  ANESTHESIA:   General endotracheal and interscalene block with Exparel  EBL: Minimal  SPECIMEN: None  Drains: None   PATIENT DISPOSITION:  PACU - hemodynamically stable.    PLAN OF CARE: Discharge to home after PACU  Brief history:  Cassidy Barber is a 68 year old female just over a year out from her index left shoulder reverse arthroplasty who had originally done well but unfortunately had an instability episode approximate 2 months ago.  This required reduction in the operating room.  She unfortunately had a recurrent instability episode this last weekend requiring reduction and local emergency room.  She has now had 2 instability episodes and due to her persistent functional limitations and pain she is brought to the operating this time for planned revision however reverse prosthesis with anticipated placement of a constrained liner  Preoperatively I counseled the patient regarding treatment options as well as potential risks versus benefits thereof.  Possible surgical complications were reviewed including bleeding, infection, neurovascular injury, persistent pain, loss of motion, anesthetic complication, failure the implant, and possible need for additional surgery.  She understands, and accepts, and agrees with our planned procedure.  Procedure in detail:  After undergoing routine preop evaluation patient received prophylactic antibiotics and interscalene block with Exparel was established in the holding area by the anesthesia department.  Subsequently placed supine on the operating table and underwent the  smooth induction of a general endotracheal anesthesia.  Placed into the beachchair position and appropriately padded and protected.  The left shoulder was examined and there was evidence for laxity with manipulation but certainly was very challenging to affect any type of dislocation of the prosthesis.  The left shoulder girdle was then sterilely prepped and draped in standard fashion.  Timeout was called.  An anterior deltopectoral approach to the shoulder was made through previous incision.  Skin flaps were elevated after sharp dissection and electrocautery was used for hemostasis.  Dissection carried deeply to the deltopectoral interval which was opened from proximal to distal.  Adhesions were then divided beneath the deltoid and the conjoined tendon was identified and mobilized retracted medially and the subscapularis remnant had scarified to the undersurface and this was carefully dissected free but certainly the subscap did not show any significant elasticity or function.  There did appear to be some remnants of the rotator cuff posterior superior which was maintained.  We divided some capsule attachments from the anterior and inferior margins of the humeral neck.  We carefully evaluated the overall construct and there did appear to be some subtle laxity but was quite challenging to find a direction and manipulation that would dislocate the shoulder.  Once we have the implant dislocated I went ahead and removed the polyethylene insert.  I then performed a series of trial reductions and ultimately felt that a +6 spacer and a +3 poly with a constrained liner gave [place] excellent soft tissue balance good motion good stability.  The joint was copes irrigated.  We did inspect the glenosphere which was stable and had a polished surface and the humeral stem appeared to be solidly fixed with no obvious loosening or migration.  Once this was confirmed we went ahead and inserted the +6 spacer and then the +3 constrained  polyethylene insert.  This point we then performed a final reduction which showed good tension of the soft tissues and good stability and good motion.  The joint was again irrigated.  Final hemostasis was obtained.  There was no subscapularis to repair.  The deltopectoral interval was reapproximated with a series of figure-of-eight and 1 Vicryl sutures.  2-0 Vicryl used for subcu layer and intracuticular 3 Monocryl for the skin followed by Dermabond and Aquasol dressing left arm was placed in a sling the patient was awakened, extubated, and taken to the recovery room in stable condition.  Jenetta Loges, PA-C was used as an Environmental consultant throughout this case essential for help with positioning the patient, positioning extremity, tissue manipulation, implantation of prosthesis, wound closure, and intraoperative decision-making.  Metta Clines Coraline Talwar MD   Contact # [phone]

## 2019-11-12 NOTE — Discharge Summary (Signed)
PATIENT ID:      Cassidy Barber  MRN:     [id number] DOB/AGE:    1951/10/13 / 68 y.o.     DISCHARGE SUMMARY  ADMISSION DATE:    11/12/2019 DISCHARGE DATE:    ADMISSION DIAGNOSIS: unstable left shoulder reverse arthroplasty Past Medical History:  Diagnosis Date  . Anemia of chronic disease 10/26/2013  . Cancer ([place])   . Hernia of unspecified site of abdominal cavity without mention of obstruction or gangrene   . Hypertension   . Liver disease   . Lynch syndrome   . Lynch syndrome   . Primary biliary cirrhosis ([place]) 10/26/2013  . Thyroid disease     DISCHARGE DIAGNOSIS:   Active Problems:   S/P reverse total shoulder arthroplasty, left   PROCEDURE: Procedure(s): Revision Left Reverse Shoulder Arthroplasty with poly exchange SDD on 11/12/2019  CONSULTS:    HISTORY:  See H&P in chart.  HOSPITAL COURSE:  Cassidy Barber is a 68 y.o. admitted on 11/12/2019 with a diagnosis of unstable left shoulder reverse arthroplasty.  They were brought to the operating room on 11/12/2019 and underwent Procedure(s): Revision Left Reverse Shoulder Arthroplasty with poly exchange SDD.    They were given perioperative antibiotics:  Anti-infectives (From admission, onward)   Start     Dose/Rate Route Frequency Ordered Stop   11/12/19 1130  ceFAZolin (ANCEF) IVPB 2g/100 mL premix     2 g 200 mL/hr over 30 Minutes Intravenous On call to O.R. 11/12/19 1117 11/12/19 1337    .  Patient underwent the above named procedure and tolerated it well.  She was monitored in the recovery room and recovered at an accelerated rate.  Given the fact that she had a long-acting interscalene block and was comfortable and tolerating p.o.'s she was felt to be medically and orthopedically stable to discharge to recover as an outpatient.  We did have occupational therapy visit with her to review appropriate levels of home exercises and activities.  We will see her back in the office postoperatively.    DIAGNOSTIC  STUDIES:  RECENT RADIOGRAPHIC STUDIES :  DG Sacrum/Coccyx  Result Date: 10/19/2019 CLINICAL DATA:  [address] [address] to floor and injured bone. EXAM: SACRUM AND COCCYX - 2+ VIEW COMPARISON:  CT 10/04/2019. FINDINGS: Degenerative changes lumbar spine and both SI joints. No evidence of fracture. 11 mm anterolisthesis L5 on S1. Surgical clips are noted the pelvis. IMPRESSION: Degenerative changes lumbar spine and both SI joints. No evidence of fracture. 11 mm anterolisthesis L5 on S1. Electronically Signed   By: Marcello Moores  Register   On: 10/19/2019 16:28   DG Shoulder Left  Result Date: 11/07/2019 CLINICAL DATA:  Left shoulder dislocation. EXAM: LEFT SHOULDER - 2+ VIEW COMPARISON:  09/27/2019 FINDINGS: Scapular Y-view demonstrates anterior dislocation of the humeral component in this patient status post left reverse shoulder arthroplasty no evidence for periprosthetic fracture. Bones are diffusely demineralized. IMPRESSION: Anterior dislocation of the humeral component in this patient status post left reverse shoulder arthroplasty. Electronically Signed   By: Misty Stanley M.D.   On: 11/07/2019 12:55   DG Shoulder Left Port  Result Date: 11/07/2019 CLINICAL DATA:  68 year old with personal history of reverse LEFT shoulder arthroplasty, post reduction imaging. EXAM: Portable LEFT SHOULDER 2 VIEWS 2:55 p.m.: COMPARISON:  Portable LEFT shoulder imaging earlier same day at 12:25 p.m. and previously. FINDINGS: Anatomic alignment of the LEFT shoulder prosthesis post reduction. No fractures. Mild degenerative changes involving the AC joint. IMPRESSION: Anatomic alignment of the LEFT shoulder prosthesis  post reduction. Electronically Signed   By: Evangeline Dakin M.D.   On: 11/07/2019 15:21    RECENT VITAL SIGNS:   Patient Vitals for the past 24 hrs:  BP Temp Temp src Pulse Resp SpO2 Height Weight  11/12/19 1515 (!) 153/78 -- -- (!) 57 15 100 % -- --  11/12/19 1500 (!) 152/83 (!) 97.4 F (36.3 C) -- 72 19 100 % -- --   11/12/19 1235 121/73 -- -- 66 18 100 % -- --  11/12/19 1230 124/71 -- -- 64 14 99 % -- --  11/12/19 1225 (!) 148/69 -- -- 62 16 100 % -- --  11/12/19 1121 140/68 98.6 F (37 C) Oral 66 18 100 % 5\' 4"  (1.626 m) 61.7 kg  .  RECENT EKG RESULTS:    Orders placed or performed during the hospital encounter of 10/04/19  . ED EKG 12-Lead  . ED EKG 12-Lead    DISCHARGE INSTRUCTIONS:    DISCHARGE MEDICATIONS:   Allergies as of 11/12/2019      Reactions   Ciprofloxacin Itching   Codeine Nausea Only      Medication List    STOP taking these medications   HYDROcodone-acetaminophen 5-325 MG tablet Commonly known as: NORCO/VICODIN   ondansetron 4 MG disintegrating tablet Commonly known as: Zofran ODT     TAKE these medications   acetaminophen 500 MG tablet Commonly known as: TYLENOL Take 1,000 mg by mouth every 6 (six) hours as needed (for pain.).   alendronate 70 MG tablet Commonly known as: FOSAMAX Take 1 tablet (70 mg total) by mouth once a week. Take with a full glass of water on an empty stomach.   ALPRAZolam 0.5 MG tablet Commonly known as: XANAX Take 1 tablet (0.5 mg total) by mouth at bedtime.   aspirin EC 81 MG tablet Take 81 mg by mouth daily.   Biotin 10000 MCG Tabs Take 10,000 mcg by mouth daily.   CALCIUM CITRATE + D3 PO Take 1 tablet by mouth 2 (two) times daily.   cetirizine 10 MG tablet Commonly known as: ZYRTEC Take 1-2 tablets 2 times a day max 40mg  What changed:   how much to take  how to take this  when to take this  additional instructions   cyclobenzaprine 5 MG tablet Commonly known as: FLEXERIL Take 1 tablet (5 mg total) by mouth 3 (three) times daily as needed for muscle spasms. What changed: when to take this   EPINEPHrine 0.3 mg/0.3 mL Soaj injection Commonly known as: EPI-PEN Use as directed for severe allergic reaction What changed:   how much to take  how to take this  when to take this  reasons to take  this  additional instructions   famotidine 20 MG tablet Commonly known as: PEPCID Take 1 tablet (20 mg total) by mouth daily. Take 1 tablet twice a day   fluorouracil 5 % cream Commonly known as: EFUDEX Apply 1 application topically daily as needed (cancer spots).   hydrOXYzine 25 MG tablet Commonly known as: ATARAX/VISTARIL TAKE 1 TABLET THREE TIMES DAILY AS NEEDED FOR ITCHING What changed:   how much to take  how to take this  when to take this  reasons to take this  additional instructions   levothyroxine 125 MCG tablet Commonly known as: SYNTHROID Take 1 tablet (125 mcg total) by mouth daily. What changed: when to take this   losartan 25 MG tablet Commonly known as: COZAAR TAKE ONE (1) TABLET EACH DAY What changed:  See the new instructions.   metoprolol succinate 25 MG 24 hr tablet Commonly known as: Toprol XL Take 1 tablet (25 mg total) by mouth daily.   ondansetron 4 MG tablet Commonly known as: Zofran Take 1 tablet (4 mg total) by mouth every 8 (eight) hours as needed for nausea or vomiting.   oxyCODONE-acetaminophen 5-325 MG tablet Commonly known as: Percocet Take 1 tablet by mouth every 4 (four) hours as needed (max 6 q).   pantoprazole 40 MG tablet Commonly known as: PROTONIX Take 1 tablet (40 mg total) by mouth 2 (two) times daily.   PROBIOTIC-10 PO Take 1 capsule by mouth daily.   ursodiol 250 MG tablet Commonly known as: ACTIGALL Take 250 mg by mouth 2 (two) times daily.   valACYclovir 500 MG tablet Commonly known as: VALTREX TAKE ONE (1) TABLET THREE (3) TIMES EACH DAY What changed: See the new instructions.   vitamin B-12 1000 MCG tablet Commonly known as: CYANOCOBALAMIN Take 1,000 mcg by mouth daily.   Vitamin D (Ergocalciferol) 1.25 MG (50000 UT) Caps capsule Commonly known as: DRISDOL Take 1 capsule (50,000 Units total) by mouth once a week. What changed: when to take this   Zortress 0.5 MG Tabs Generic drug:  Everolimus Take 2 mg by mouth 2 (two) times daily.       FOLLOW UP VISIT:   Follow-up Information    Justice Britain, MD.   Specialty: Orthopedic Surgery Why: Call to be seen in 10-14 days Contact information: [address] [place] [address] [phone]           DISCHARGE TO: Home   DISCHARGE CONDITION:  Thereasa Parkin Kaileigh Viswanathan for Dr. Justice Britain 11/12/2019, 3:24 PM

## 2019-11-12 NOTE — Anesthesia Preprocedure Evaluation (Addendum)
Anesthesia Evaluation  Patient identified by MRN, date of birth, ID band Patient awake    Reviewed: Allergy & Precautions, NPO status , Patient's Chart, lab work & pertinent test results, reviewed documented beta blocker date and time   History of Anesthesia Complications Negative for: history of anesthetic complications  Airway Mallampati: II  TM Distance: >3 FB Neck ROM: Full    Dental no notable dental hx. (+) Dental Advisory Given   Pulmonary neg pulmonary ROS,    Pulmonary exam normal        Cardiovascular hypertension, Pt. on home beta blockers and Pt. on medications Normal cardiovascular exam     Neuro/Psych PSYCHIATRIC DISORDERS Anxiety negative neurological ROS     GI/Hepatic S/p liver transplant 2015   Endo/Other  Hypothyroidism   Renal/GU negative Renal ROS     Musculoskeletal negative musculoskeletal ROS (+)   Abdominal   Peds  Hematology  (+) anemia ,   Anesthesia Other Findings   Reproductive/Obstetrics negative OB ROS                            Anesthesia Physical  Anesthesia Plan  ASA: III  Anesthesia Plan: General   Post-op Pain Management: GA combined w/ Regional for post-op pain   Induction: Intravenous  PONV Risk Score and Plan: 3 and Ondansetron, Dexamethasone, Midazolam and Treatment may vary due to age or medical condition  Airway Management Planned: Oral ETT  Additional Equipment:   Intra-op Plan:   Post-operative Plan: Extubation in OR  Informed Consent: I have reviewed the patients History and Physical, chart, labs and discussed the procedure including the risks, benefits and alternatives for the proposed anesthesia with the patient or authorized representative who has indicated his/her understanding and acceptance.     Dental advisory given  Plan Discussed with: Anesthesiologist and CRNA  Anesthesia Plan Comments:        Anesthesia  Quick Evaluation

## 2019-11-16 ENCOUNTER — Encounter: Payer: Self-pay | Admitting: *Deleted

## 2019-11-17 ENCOUNTER — Other Ambulatory Visit (INDEPENDENT_AMBULATORY_CARE_PROVIDER_SITE_OTHER): Payer: Self-pay | Admitting: *Deleted

## 2019-11-17 ENCOUNTER — Other Ambulatory Visit: Payer: Medicare Other

## 2019-11-17 ENCOUNTER — Encounter (INDEPENDENT_AMBULATORY_CARE_PROVIDER_SITE_OTHER): Payer: Self-pay | Admitting: *Deleted

## 2019-11-17 ENCOUNTER — Other Ambulatory Visit: Payer: Self-pay

## 2019-11-17 ENCOUNTER — Ambulatory Visit (INDEPENDENT_AMBULATORY_CARE_PROVIDER_SITE_OTHER): Payer: Self-pay

## 2019-11-17 ENCOUNTER — Telehealth (INDEPENDENT_AMBULATORY_CARE_PROVIDER_SITE_OTHER): Payer: Self-pay | Admitting: *Deleted

## 2019-11-17 DIAGNOSIS — Z298 Encounter for other specified prophylactic measures: Secondary | ICD-10-CM | POA: Diagnosis not present

## 2019-11-17 DIAGNOSIS — Z944 Liver transplant status: Secondary | ICD-10-CM | POA: Diagnosis not present

## 2019-11-17 DIAGNOSIS — D849 Immunodeficiency, unspecified: Secondary | ICD-10-CM | POA: Diagnosis not present

## 2019-11-17 NOTE — Telephone Encounter (Signed)
Referring MD/PCP: christy hawks   Procedure:  flex sig/egd  Reason/Indication:  Hx colon ca, hx polyps, lynch syndrome, chest pain  Has patient had this procedure before?  Yes, 2019 (both)             If so, when, by whom and where?    Is there a family history of colon cancer?  no             Who?  What age when diagnosed?    Is patient diabetic?   no                                                  Does patient have prosthetic heart valve or mechanical valve?  no  Do you have a pacemaker/defibrillator?  no  Has patient ever had endocarditis/atrial fibrillation? no  Does patient use oxygen? no  Has patient had joint replacement within last 12 months?  no  Is patient constipated or do they take laxatives? no  Does patient have a history of alcohol/drug use?  no  Is patient on blood thinner such as Coumadin, Plavix and/or Aspirin? yes  Medications: asa 81 mg daily, alendronate 70 mg once a week, alprazolam 0.5 mg prn, biotin daily, calcium daily, zyrtec 40 mg daily, famotidine 20 mg bid, hydroxyzine 25 mg prn, levothyroxine 125 mcg daily, imodium prn, losartan 25 mg daily, magnesium daily, metoprolol 25 mg daily, singulair daily, pantoprazole 40 mg daily, ursodiol 250 mg, vit b12 daily, vit d 50,000 units once a week, vit d3 75 mcg daily  Allergies: Cipro, codeine   Medication Adjustment per Dr Laural Golden: asa 2 days  Procedure date & time: 12/16/19 at 88

## 2019-11-19 ENCOUNTER — Other Ambulatory Visit: Payer: Self-pay | Admitting: Physician Assistant

## 2019-11-19 DIAGNOSIS — C44622 Squamous cell carcinoma of skin of right upper limb, including shoulder: Secondary | ICD-10-CM | POA: Diagnosis not present

## 2019-11-19 DIAGNOSIS — D0461 Carcinoma in situ of skin of right upper limb, including shoulder: Secondary | ICD-10-CM | POA: Diagnosis not present

## 2019-11-19 DIAGNOSIS — C44729 Squamous cell carcinoma of skin of left lower limb, including hip: Secondary | ICD-10-CM | POA: Diagnosis not present

## 2019-11-19 DIAGNOSIS — C44329 Squamous cell carcinoma of skin of other parts of face: Secondary | ICD-10-CM | POA: Diagnosis not present

## 2019-11-19 DIAGNOSIS — C44529 Squamous cell carcinoma of skin of other part of trunk: Secondary | ICD-10-CM | POA: Diagnosis not present

## 2019-11-22 ENCOUNTER — Encounter: Payer: Self-pay | Admitting: Cardiology

## 2019-11-22 NOTE — Progress Notes (Signed)
Cardiology Office Note  Date: 11/23/2019   ID: [name], DOB 02-28-1951, MRN [id number]  PCP:  [name], FNP  Consulting Cardiologist: Satira Sark, MD Electrophysiologist:  None   Chief Complaint  Patient presents with  . Abnormal ECG    History of Present Illness: Cassidy Barber is a 69 y.o. female referred for cardiology consultation by Ms. Hawks NP.  I reviewed records, it appears that she is referred related to reported fatigue and shortness of breath as of office visit back in October 2020 with concerns about ECG.  I reviewed her complex medical history and updated the chart.  She is followed at [facility] with a history of primary biliary cirrhosis status post liver transplantation January 2015.  She also has a history of colon cancer and Lynch syndrome.  She follows with gastroenterology locally, Dr. Laural Golden.  Records suggest pretransplant pulmonary hypertension with right heart failure, although subsequent improvement.  She has not had a recent echocardiogram.  She presents today reporting no specific sense of shortness of breath or unusual fatigue with typical ADLs.  She states that she easily is able to walk up a flight of steps at her house on a routine basis, has no exertional chest pain, no palpitations or syncope.  She has had recent left shoulder surgery without obvious perioperative events.  I reviewed her most recent tracings.  Past Medical History:  Diagnosis Date  . Abdominal wall hernia    Incarcerated status post surgical repair 2019 - [facility]  . Anemia of chronic disease   . Colon cancer [facility])    Colon surgery 2005 and 2012  . History of pulmonary hypertension    Pre liver transplant  . History of seizures   . Hypertension   . Hypothyroidism   . Lynch syndrome   . Osteopenia   . Primary biliary cirrhosis ([facility])    Status post liver transplantation - follows at [facility]    Past Surgical History:  Procedure Laterality Date  . ABDOMINAL HERNIA  REPAIR     Patient's states that she has had 8- 9 hernia surgeries  . ABDOMINAL HYSTERECTOMY    . CHOLECYSTECTOMY  2007  . COLON RESECTION    . COLON SURGERY  2008   Done at [facility]  . COLONOSCOPY     Done at [facility]  . ESOPHAGOGASTRODUODENOSCOPY N/A 08/21/2018   Procedure: ESOPHAGOGASTRODUODENOSCOPY (EGD);  Surgeon: Rogene Houston, MD;  [facility];  Service: Endoscopy;  Laterality: N/A;  . EYE SURGERY     lasix  . FLEXIBLE SIGMOIDOSCOPY N/A 10/20/2015   Procedure: FLEXIBLE SIGMOIDOSCOPY;  Surgeon: Rogene Houston, MD;  [facility];  Service: Endoscopy;  Laterality: N/A;  4 - Dr Laural Golden has meeting until 1:00  . FLEXIBLE SIGMOIDOSCOPY N/A 07/11/2016   Procedure: FLEXIBLE SIGMOIDOSCOPY;  Surgeon: Rogene Houston, MD;  [facility];  Service: Endoscopy;  Laterality: N/A;  1200  . FLEXIBLE SIGMOIDOSCOPY N/A 08/09/2017   Procedure: FLEXIBLE SIGMOIDOSCOPY;  Surgeon: Rogene Houston, MD;  [facility];  Service: Endoscopy;  Laterality: N/A;  1:00  . FLEXIBLE SIGMOIDOSCOPY N/A 08/21/2018   Procedure: FLEXIBLE SIGMOIDOSCOPY;  Surgeon: Rogene Houston, MD;  [facility];  Service: Endoscopy;  Laterality: N/A;  . FRACTURE SURGERY     right wrist metal plate  . HERNIA REPAIR    . LIVER TRANSPLANT  [id number]  . multiple skin cancers removed    . POLYPECTOMY  08/09/2017  Procedure: POLYPECTOMY;  Surgeon: Rogene Houston, MD;  [facility];  Service: Endoscopy;;  colon small bowel  . REVERSE SHOULDER ARTHROPLASTY Left 07/17/2018  . REVERSE SHOULDER ARTHROPLASTY Left 07/17/2018   Procedure: LEFT REVERSE SHOULDER ARTHROPLASTY;  Surgeon: Justice Britain, MD;  Location: [facility];  Service: Orthopedics;  Laterality: Left;  178min  . SHOULDER CLOSED REDUCTION Left 09/27/2019   Procedure: CLOSED REDUCTION SHOULDER;  Surgeon: Paralee Cancel, MD;  Location: [facility];  Service: Orthopedics;  Laterality: Left;  . SPLENECTOMY  2006  . TOTAL SHOULDER  REVISION Left 11/12/2019   Procedure: Revision Left Reverse Shoulder Arthroplasty with poly exchange SDD;  Surgeon: Justice Britain, MD;  Location: [facility];  Service: Orthopedics;  Laterality: Left;  135min -SDDC  . TYMPANOSTOMY TUBE PLACEMENT    . UPPER GASTROINTESTINAL ENDOSCOPY     Done at [facility]    Current Outpatient Medications  Medication Sig Dispense Refill  . acetaminophen (TYLENOL) 500 MG tablet Take 1,000 mg by mouth every 6 (six) hours as needed (for pain.).     [name] alendronate (FOSAMAX) 70 MG tablet Take 1 tablet (70 mg total) by mouth once a week. Take with a full glass of water on an empty stomach. 12 tablet 0  . ALPRAZolam (XANAX) 0.5 MG tablet Take 1 tablet (0.5 mg total) by mouth at bedtime. 90 tablet 1  . aspirin EC 81 MG tablet Take 81 mg by mouth daily.    . Biotin 10000 MCG TABS Take 10,000 mcg by mouth daily.    . Calcium Citrate-Vitamin D (CALCIUM CITRATE + D3 PO) Take 1 tablet by mouth 2 (two) times daily.    . cetirizine (ZYRTEC) 10 MG tablet Take 1-2 tablets 2 times a day max 40mg  (Patient taking differently: Take 10 mg by mouth 2 (two) times daily. ) 120 tablet 5  . cyclobenzaprine (FLEXERIL) 5 MG tablet Take 1 tablet (5 mg total) by mouth 3 (three) times daily as needed for muscle spasms. 60 tablet 1  . EPINEPHrine 0.3 mg/0.3 mL IJ SOAJ injection Use as directed for severe allergic reaction (Patient taking differently: Inject 0.3 mg into the muscle as needed for anaphylaxis. ) 2 Device 2  . famotidine (PEPCID) 20 MG tablet Take 1 tablet (20 mg total) by mouth daily. Take 1 tablet twice a day 180 tablet 1  . fluorouracil (EFUDEX) 5 % cream Apply 1 application topically daily as needed (cancer spots).   0  . hydrOXYzine (ATARAX/VISTARIL) 25 MG tablet TAKE 1 TABLET THREE TIMES DAILY AS NEEDED FOR ITCHING (Patient taking differently: Take 25 mg by mouth 3 (three) times daily as needed for itching. ) 60 tablet 5  . levothyroxine (SYNTHROID) 125 MCG tablet Take 1 tablet (125 mcg  total) by mouth daily. (Patient taking differently: Take 125 mcg by mouth daily before breakfast. ) 30 tablet 0  . losartan (COZAAR) 25 MG tablet TAKE ONE (1) TABLET EACH DAY (Patient taking differently: Take 25 mg by mouth daily. ) 90 tablet 2  . metoprolol succinate (TOPROL XL) 25 MG 24 hr tablet Take 1 tablet (25 mg total) by mouth daily. 30 tablet 11  . ondansetron (ZOFRAN) 4 MG tablet Take 1 tablet (4 mg total) by mouth every 8 (eight) hours as needed for nausea or vomiting. 10 tablet 0  . oxyCODONE-acetaminophen (PERCOCET) 5-325 MG tablet Take 1 tablet by mouth every 4 (four) hours as needed (max 6 q). 20 tablet 0  . pantoprazole (PROTONIX) 40 MG tablet Take 1 tablet (  40 mg total) by mouth 2 (two) times daily. 180 tablet 1  . Probiotic Product (PROBIOTIC-10 PO) Take 1 capsule by mouth daily.     . ursodiol (ACTIGALL) 250 MG tablet Take 250 mg by mouth 2 (two) times daily.     . valACYclovir (VALTREX) 500 MG tablet TAKE ONE (1) TABLET THREE (3) TIMES EACH DAY (Patient taking differently: Take 500 mg by mouth 2 (two) times daily. ) 30 tablet 4  . vitamin B-12 (CYANOCOBALAMIN) 1000 MCG tablet Take 1,000 mcg by mouth daily.    . Vitamin D, Ergocalciferol, (DRISDOL) 1.25 MG (50000 UT) CAPS capsule Take 1 capsule (50,000 Units total) by mouth once a week. (Patient taking differently: Take 50,000 Units by mouth every Thursday. ) 12 capsule 2  . ZORTRESS 0.5 MG TABS Take 2 mg by mouth 2 (two) times daily.      No current facility-administered medications for this visit.   Allergies:  Ciprofloxacin and Codeine   Social History: The patient  reports that she has never smoked. She has never used smokeless tobacco. She reports that she does not drink alcohol or use drugs.   Family History: The patient's family history includes Alcohol abuse in her brother; Colon cancer in her father and sister; Healthy in her son; Lung cancer in her sister; Prostate cancer in her father.   ROS:  Please see the history  of present illness. Otherwise, complete review of systems is positive for recent left shoulder surgery.  All other systems are reviewed and negative.   Physical Exam: VS:  BP 112/66   Pulse 71   Temp (!) 96.8 F (36 C)   Ht 5\' 4"  (1.626 m)   Wt 139 lb (63 kg)   SpO2 98%   BMI 23.86 kg/m , BMI Body mass index is 23.86 kg/m.  Wt Readings from Last 3 Encounters:  11/23/19 139 lb (63 kg)  11/12/19 136 lb (61.7 kg)  11/11/19 138 lb (62.6 kg)    General: Patient appears comfortable at rest. HEENT: Conjunctiva and lids normal, wearing a mask. Neck: Supple, no elevated JVP or carotid bruits, no thyromegaly. Lungs: Clear to auscultation, nonlabored breathing at rest. Cardiac: Regular rate and rhythm, second heart sound normal, no S3 or significant systolic murmur, no pericardial rub. Abdomen: Soft, nontender, bowel sounds present. Extremities: No pitting edema, distal pulses 2+. Skin: Warm and dry. Musculoskeletal: No kyphosis. Neuropsychiatric: Alert and oriented x3, affect grossly appropriate.  ECG:  An ECG dated 08/28/2019 was personally reviewed today and demonstrated:  Sinus rhythm with nonspecific anteroseptal T wave inversions.  Recent Labwork: 11/24/2018: Magnesium 2.1 04/13/2019: TSH 3.510 11/11/2019: ALT 20; AST 28; BUN 12; Creatinine, Ser 0.60; Hemoglobin 9.8; Platelets 500; Potassium 4.0; Sodium 138     Component Value Date/Time   CHOL 156 07/05/2014 0850   TRIG 116 07/05/2014 0850   HDL 60 07/05/2014 0850   CHOLHDL 2.6 07/05/2014 0850   LDLCALC 73 07/05/2014 0850    Other Studies Reviewed Today:  Echocardiogram 08/16/2014: IVSd 0.77 cm  LVIDd 4.86 cm  LVPWd 0.88 cm  LVOT PG 5.72 mmHg   LVOT Vel 119.56 cm/s  AoV PG 6.55 mmHg   AoV Vel 128 cm/s  MV A Vel 54.42 cm/s  MV DT 361.82 ms  MV E Vel 52.91 cm/s  MV E/A 0.97   E/E' ratio 7.78   PV pk gradient 3.11 mmHg   PV Vmax 88.24 cm/s  AO ASC D 2.53 cm  Aod 2.4 cm  P Vein  A 25.22 cm/s  P Vein D 48.76 cm/s    P Vein S 65.75 cm/s  PULV S/D 1.35   LVIDs 3.1 cm  LA Vol Index 34.8 ml/m2  TR VMAX 278 cm/s  TR PG 31.00 mmHg   PASP 36.00 mmHg   Result Narrative   Left Ventricle: Normal cavity size and wall thickness. Ejection fraction  is 50 - 55%. Left ventricular systolic function is low normal.  Mild diastolic dysfunction.  Right Ventricle: Normal cavity size and ejection fraction.  Chest x-ray 10/04/2019: FINDINGS: Cardiac silhouette is normal in size. No mediastinal or hilar masses.  Lungs are clear.  No pleural effusion or pneumothorax.  Partly imaged left shoulder prosthesis.  IMPRESSION: No acute cardiopulmonary disease.  Assessment and Plan:  1.  History of pulmonary hypertension as outlined above, presumably improved after liver transplantation.  She has not had a follow-up echocardiogram in the last 5 years and this will be obtained.  She does not report any unusual degree of shortness of breath with typical ADLs today, describes activities exceeding 4 METS without symptomatology.  ECG is nonspecific with anteroseptal T wave inversions.  2.  Essential hypertension, blood pressure is normal today on combination of Toprol-XL and losartan.  3.  History of colon cancer and Lynch syndrome.  She follows with Dr. Laural Golden and anticipates repeat endoscopy in February.  4.  History of primary biliary cirrhosis status post liver transplantation at [facility].  She is currently following at [facility].  Medication Adjustments/Labs and Tests Ordered: Current medicines are reviewed at length with the patient today.  Concerns regarding medicines are outlined above.   Tests Ordered: Orders Placed This Encounter  Procedures  . ECHOCARDIOGRAM COMPLETE    Medication Changes: No orders of the defined types were placed in this encounter.   Disposition:  Follow up test results.  Signed, Satira Sark, MD, [facility] 11/23/2019 10:52 AM    [facility] at [facility] [address] [place], [place] [address] Phone: [phone]; Fax: [phone]

## 2019-11-23 ENCOUNTER — Ambulatory Visit: Payer: Medicare Other | Admitting: Cardiology

## 2019-11-23 ENCOUNTER — Encounter: Payer: Self-pay | Admitting: Cardiology

## 2019-11-23 VITALS — BP 112/66 | HR 71 | Temp 96.8°F | Ht 64.0 in | Wt 139.0 lb

## 2019-11-23 DIAGNOSIS — Z8679 Personal history of other diseases of the circulatory system: Secondary | ICD-10-CM | POA: Diagnosis not present

## 2019-11-23 DIAGNOSIS — I10 Essential (primary) hypertension: Secondary | ICD-10-CM | POA: Diagnosis not present

## 2019-11-23 NOTE — Telephone Encounter (Signed)
EGD and flexible sigmoidoscopy with conscious sedation

## 2019-11-23 NOTE — Patient Instructions (Signed)
Medication Instructions:  Your physician recommends that you continue on your current medications as directed. Please refer to the Current Medication list given to you today.  *If you need a refill on your cardiac medications before your next appointment, please call your pharmacy*  Lab Work: NONE If you have labs (blood work) drawn today and your tests are completely normal, you will receive your results only by: [name] MyChart Message (if you have MyChart) OR . A paper copy in the mail If you have any lab test that is abnormal or we need to change your treatment, we will call you to review the results.  Testing/Procedures: Your physician recommends that you continue on your current medications as directed. Please refer to the Current Medication list given to you today.   Follow-Up: At [facility], you and your health needs are our priority.  As part of our continuing mission to provide you with exceptional heart care, we have created designated Provider Care Teams.  These Care Teams include your primary Cardiologist (physician) and Advanced Practice Providers (APPs -  Physician Assistants and Nurse Practitioners) who all work together to provide you with the care you need, when you need it.   We will call you with the results of your echo.      Thank you for choosing [facility] !

## 2019-11-25 DIAGNOSIS — Z96612 Presence of left artificial shoulder joint: Secondary | ICD-10-CM | POA: Diagnosis not present

## 2019-11-25 DIAGNOSIS — Z471 Aftercare following joint replacement surgery: Secondary | ICD-10-CM | POA: Diagnosis not present

## 2019-11-26 ENCOUNTER — Other Ambulatory Visit: Payer: Self-pay

## 2019-11-26 ENCOUNTER — Ambulatory Visit (HOSPITAL_COMMUNITY)
Admission: RE | Admit: 2019-11-26 | Discharge: 2019-11-26 | Disposition: A | Discharge status: Home / Self Care | Payer: Medicare Other | Source: Ambulatory Visit | Attending: Cardiology | Admitting: Cardiology

## 2019-11-26 DIAGNOSIS — H2513 Age-related nuclear cataract, bilateral: Secondary | ICD-10-CM | POA: Diagnosis not present

## 2019-11-26 DIAGNOSIS — Z1509 Genetic susceptibility to other malignant neoplasm: Secondary | ICD-10-CM | POA: Diagnosis not present

## 2019-11-26 DIAGNOSIS — Z8679 Personal history of other diseases of the circulatory system: Secondary | ICD-10-CM | POA: Diagnosis not present

## 2019-11-26 DIAGNOSIS — H43813 Vitreous degeneration, bilateral: Secondary | ICD-10-CM | POA: Diagnosis not present

## 2019-11-26 DIAGNOSIS — E039 Hypothyroidism, unspecified: Secondary | ICD-10-CM | POA: Diagnosis not present

## 2019-11-26 DIAGNOSIS — I10 Essential (primary) hypertension: Secondary | ICD-10-CM | POA: Insufficient documentation

## 2019-11-26 NOTE — Progress Notes (Signed)
*  PRELIMINARY RESULTS* Echocardiogram 2D Echocardiogram has been performed.  Leavy Cella 11/26/2019, 10:17 AM

## 2019-12-01 ENCOUNTER — Other Ambulatory Visit (INDEPENDENT_AMBULATORY_CARE_PROVIDER_SITE_OTHER): Payer: Self-pay | Admitting: *Deleted

## 2019-12-01 DIAGNOSIS — Z85038 Personal history of other malignant neoplasm of large intestine: Secondary | ICD-10-CM

## 2019-12-01 DIAGNOSIS — R0789 Other chest pain: Secondary | ICD-10-CM

## 2019-12-01 DIAGNOSIS — Z1509 Genetic susceptibility to other malignant neoplasm: Secondary | ICD-10-CM

## 2019-12-02 ENCOUNTER — Other Ambulatory Visit: Payer: Self-pay | Admitting: Family

## 2019-12-02 DIAGNOSIS — L509 Urticaria, unspecified: Secondary | ICD-10-CM

## 2019-12-04 ENCOUNTER — Other Ambulatory Visit (HOSPITAL_COMMUNITY): Payer: Self-pay

## 2019-12-04 DIAGNOSIS — D473 Essential (hemorrhagic) thrombocythemia: Secondary | ICD-10-CM

## 2019-12-04 DIAGNOSIS — D75839 Thrombocytosis, unspecified: Secondary | ICD-10-CM

## 2019-12-04 DIAGNOSIS — D508 Other iron deficiency anemias: Secondary | ICD-10-CM

## 2019-12-04 DIAGNOSIS — D638 Anemia in other chronic diseases classified elsewhere: Secondary | ICD-10-CM

## 2019-12-07 ENCOUNTER — Other Ambulatory Visit: Payer: Self-pay

## 2019-12-07 ENCOUNTER — Other Ambulatory Visit (HOSPITAL_COMMUNITY): Payer: Medicare Other

## 2019-12-07 ENCOUNTER — Inpatient Hospital Stay (HOSPITAL_COMMUNITY): Payer: Medicare Other | Attending: Hematology

## 2019-12-07 DIAGNOSIS — D473 Essential (hemorrhagic) thrombocythemia: Secondary | ICD-10-CM

## 2019-12-07 DIAGNOSIS — D638 Anemia in other chronic diseases classified elsewhere: Secondary | ICD-10-CM

## 2019-12-07 DIAGNOSIS — Z79899 Other long term (current) drug therapy: Secondary | ICD-10-CM | POA: Diagnosis not present

## 2019-12-07 DIAGNOSIS — D509 Iron deficiency anemia, unspecified: Secondary | ICD-10-CM | POA: Insufficient documentation

## 2019-12-07 DIAGNOSIS — Z1509 Genetic susceptibility to other malignant neoplasm: Secondary | ICD-10-CM | POA: Insufficient documentation

## 2019-12-07 DIAGNOSIS — D508 Other iron deficiency anemias: Secondary | ICD-10-CM

## 2019-12-07 DIAGNOSIS — D75839 Thrombocytosis, unspecified: Secondary | ICD-10-CM

## 2019-12-07 LAB — COMPREHENSIVE METABOLIC PANEL
ALT: 18 U/L (ref 0–44)
AST: 26 U/L (ref 15–41)
Albumin: 3.5 g/dL (ref 3.5–5.0)
Alkaline Phosphatase: 168 U/L — ABNORMAL HIGH (ref 38–126)
Anion gap: 9 (ref 5–15)
BUN: 24 mg/dL — ABNORMAL HIGH (ref 8–23)
CO2: 26 mmol/L (ref 22–32)
Calcium: 8.9 mg/dL (ref 8.9–10.3)
Chloride: 101 mmol/L (ref 98–111)
Creatinine, Ser: 0.82 mg/dL (ref 0.44–1.00)
GFR calc Af Amer: >60 mL/min (ref >60)
GFR calc non Af Amer: >60 mL/min (ref >60)
Glucose, Bld: 104 mg/dL — ABNORMAL HIGH (ref 70–99)
Potassium: 4.5 mmol/L (ref 3.5–5.1)
Sodium: 136 mmol/L (ref 135–145)
Total Bilirubin: 0.5 mg/dL (ref 0.3–1.2)
Total Protein: 7.2 g/dL (ref 6.5–8.1)

## 2019-12-07 LAB — CBC WITH DIFFERENTIAL/PLATELET
Abs Immature Granulocytes: 0.04 10*3/uL (ref 0.00–0.07)
Basophils Absolute: 0.1 10*3/uL (ref 0.0–0.1)
Basophils Relative: 1 %
Eosinophils Absolute: 0.7 10*3/uL — ABNORMAL HIGH (ref 0.0–0.5)
Eosinophils Relative: 9 %
HCT: 38.9 % (ref 36.0–46.0)
Hemoglobin: 11.5 g/dL — ABNORMAL LOW (ref 12.0–15.0)
Immature Granulocytes: 1 %
Lymphocytes Relative: 30 %
Lymphs Abs: 2.3 10*3/uL (ref 0.7–4.0)
MCH: 25.2 pg — ABNORMAL LOW (ref 26.0–34.0)
MCHC: 29.6 g/dL — ABNORMAL LOW (ref 30.0–36.0)
MCV: 85.1 fL (ref 80.0–100.0)
Monocytes Absolute: 0.9 10*3/uL (ref 0.1–1.0)
Monocytes Relative: 12 %
Neutro Abs: 3.5 10*3/uL (ref 1.7–7.7)
Neutrophils Relative %: 47 %
Platelets: 358 10*3/uL (ref 150–400)
RBC: 4.57 MIL/uL (ref 3.87–5.11)
WBC: 7.5 10*3/uL (ref 4.0–10.5)
nRBC: 0 % (ref 0.0–0.2)

## 2019-12-07 LAB — FERRITIN: Ferritin: 105 ng/mL (ref 11–307)

## 2019-12-07 LAB — LACTATE DEHYDROGENASE: LDH: 197 U/L — ABNORMAL HIGH (ref 98–192)

## 2019-12-08 ENCOUNTER — Inpatient Hospital Stay (HOSPITAL_BASED_OUTPATIENT_CLINIC_OR_DEPARTMENT_OTHER): Payer: Medicare Other | Admitting: Nurse Practitioner

## 2019-12-08 ENCOUNTER — Encounter (INDEPENDENT_AMBULATORY_CARE_PROVIDER_SITE_OTHER): Payer: Self-pay

## 2019-12-08 ENCOUNTER — Other Ambulatory Visit (HOSPITAL_COMMUNITY): Payer: Medicare Other

## 2019-12-08 ENCOUNTER — Other Ambulatory Visit: Payer: Self-pay

## 2019-12-08 DIAGNOSIS — D508 Other iron deficiency anemias: Secondary | ICD-10-CM | POA: Diagnosis not present

## 2019-12-08 NOTE — Progress Notes (Signed)
[address], [place] [address]   CLINIC:  Medical Oncology/Hematology  PCP:  [name], [address] [place] [address] [phone]   REASON FOR VISIT: Follow-up for iron deficiency anemia  CURRENT THERAPY: Intermittent iron infusions   INTERVAL HISTORY:  Cassidy Barber 69 y.o. female was called for a telephone interview today due to her iron deficiency anemia.  Patient reports she has been doing great since her last visit.  She reports her fatigue has improved since her iron infusions.  She denies any bright red bleeding per rectum or melena.  She denies any easy bruising or bleeding. Denies any nausea, vomiting, or diarrhea. Denies any new pains. Had not noticed any recent bleeding such as epistaxis, hematuria or hematochezia. Denies recent chest pain on exertion, shortness of breath on minimal exertion, pre-syncopal episodes, or palpitations. Denies any numbness or tingling in hands or feet. Denies any recent fevers, infections, or recent hospitalizations. Patient reports appetite at 100% and energy level at 75%.  She is eating well maintaining her weight at this time.     REVIEW OF SYSTEMS:  Review of Systems  Constitutional: Positive for fatigue.  All other systems reviewed and are negative.    PAST MEDICAL/SURGICAL HISTORY:  Past Medical History:  Diagnosis Date  . Abdominal wall hernia    Incarcerated status post surgical repair 2019 - [facility]  . Anemia of chronic disease   . Colon cancer [facility])    Colon surgery 2005 and 2012  . History of pulmonary hypertension    Pre liver transplant  . History of seizures   . Hypertension   . Hypothyroidism   . Lynch syndrome   . Osteopenia   . Primary biliary cirrhosis (HCC)    Status post liver transplantation - follows at [facility]   Past Surgical History:  Procedure Laterality Date  . ABDOMINAL HERNIA REPAIR     Patient's states that she has had 8- 9 hernia surgeries  .  ABDOMINAL HYSTERECTOMY    . CHOLECYSTECTOMY  2007  . COLON RESECTION    . COLON SURGERY  2008   Done at [facility]  . COLONOSCOPY     Done at [facility]  . ESOPHAGOGASTRODUODENOSCOPY N/A 08/21/2018   Procedure: ESOPHAGOGASTRODUODENOSCOPY (EGD);  Surgeon: Rogene Houston, MD;  [facility];  Service: Endoscopy;  Laterality: N/A;  . EYE SURGERY     lasix  . FLEXIBLE SIGMOIDOSCOPY N/A 10/20/2015   Procedure: FLEXIBLE SIGMOIDOSCOPY;  Surgeon: Rogene Houston, MD;  [facility];  Service: Endoscopy;  Laterality: N/A;  33 - Dr Laural Golden has meeting until 1:00  . FLEXIBLE SIGMOIDOSCOPY N/A 07/11/2016   Procedure: FLEXIBLE SIGMOIDOSCOPY;  Surgeon: Rogene Houston, MD;  [facility];  Service: Endoscopy;  Laterality: N/A;  1200  . FLEXIBLE SIGMOIDOSCOPY N/A 08/09/2017   Procedure: FLEXIBLE SIGMOIDOSCOPY;  Surgeon: Rogene Houston, MD;  [facility];  Service: Endoscopy;  Laterality: N/A;  1:00  . FLEXIBLE SIGMOIDOSCOPY N/A 08/21/2018   Procedure: FLEXIBLE SIGMOIDOSCOPY;  Surgeon: Rogene Houston, MD;  [facility];  Service: Endoscopy;  Laterality: N/A;  . FRACTURE SURGERY     right wrist metal plate  . HERNIA REPAIR    . LIVER TRANSPLANT  [id number]  . multiple skin cancers removed    . POLYPECTOMY  08/09/2017   Procedure: POLYPECTOMY;  Surgeon: Rogene Houston, MD;  [facility];  Service: Endoscopy;;  colon  small bowel  . REVERSE SHOULDER ARTHROPLASTY Left 07/17/2018  . REVERSE SHOULDER ARTHROPLASTY Left 07/17/2018   Procedure: LEFT REVERSE SHOULDER ARTHROPLASTY;  Surgeon: Justice Britain, MD;  Location: [facility];  Service: Orthopedics;  Laterality: Left;  116min  . SHOULDER CLOSED REDUCTION Left 09/27/2019   Procedure: CLOSED REDUCTION SHOULDER;  Surgeon: Paralee Cancel, MD;  Location: [facility];  Service: Orthopedics;  Laterality: Left;  . SPLENECTOMY  2006  . TOTAL SHOULDER REVISION Left 11/12/2019   Procedure: Revision Left Reverse Shoulder  Arthroplasty with poly exchange SDD;  Surgeon: Justice Britain, MD;  Location: [facility];  Service: Orthopedics;  Laterality: Left;  125min -SDDC  . TYMPANOSTOMY TUBE PLACEMENT    . UPPER GASTROINTESTINAL ENDOSCOPY     Done at [facility]     SOCIAL HISTORY:  Social History   Socioeconomic History  . Marital status: Married    Spouse name: Charlotte Crumb  . Number of children: 1  . Years of education: 20  . Highest education level: High school graduate  Occupational History  . Occupation: Disability    Employer: [employer]    Comment: Multimedia programmer  Tobacco Use  . Smoking status: Never Smoker  . Smokeless tobacco: Never Used  Substance and Sexual Activity  . Alcohol use: No    Alcohol/week: 0.0 standard drinks  . Drug use: No  . Sexual activity: Yes    Birth control/protection: None  Other Topics Concern  . Not on file  Social History Narrative   Patient lives in a two story home with her husband. She has an adult son. She is retired from being an Web designer for 30 years.    Social Determinants of Health   Financial Resource Strain:   . Difficulty of Paying Living Expenses: Not on file  Food Insecurity:   . Worried About Charity fundraiser in the Last Year: Not on file  . Ran Out of Food in the Last Year: Not on file  Transportation Needs:   . Lack of Transportation (Medical): Not on file  . Lack of Transportation (Non-Medical): Not on file  Physical Activity: Inactive  . Days of Exercise per Week: 0 days  . Minutes of Exercise per Session: 0 min  Stress:   . Feeling of Stress : Not on file  Social Connections:   . Frequency of Communication with Friends and Family: Not on file  . Frequency of Social Gatherings with Friends and Family: Not on file  . Attends Religious Services: Not on file  . Active Member of Clubs or Organizations: Not on file  . Attends Archivist Meetings: Not on file  . Marital Status: Not on file  Intimate Partner  Violence:   . Fear of Current or Ex-Partner: Not on file  . Emotionally Abused: Not on file  . Physically Abused: Not on file  . Sexually Abused: Not on file    FAMILY HISTORY:  Family History  Problem Relation Age of Onset  . Prostate cancer Father   . Colon cancer Father   . Colon cancer Sister   . Lung cancer Sister   . Healthy Son   . Alcohol abuse Brother   . Allergic rhinitis Neg Hx   . Asthma Neg Hx   . Eczema Neg Hx   . Urticaria Neg Hx     CURRENT MEDICATIONS:  Outpatient Encounter Medications as of 12/08/2019  Medication Sig Note  . acetaminophen (TYLENOL) 500 MG tablet Take 1,000 mg by mouth every 6 (six)  hours as needed (for pain.).    [name] alendronate (FOSAMAX) 70 MG tablet Take 1 tablet (70 mg total) by mouth once a week. Take with a full glass of water on an empty stomach. (Patient taking differently: Take 70 mg by mouth every Thursday. Take with a full glass of water on an empty stomach.)   . ALPRAZolam (XANAX) 0.5 MG tablet Take 1 tablet (0.5 mg total) by mouth at bedtime.   [name] aspirin EC 81 MG tablet Take 81 mg by mouth daily.   . Biotin 10000 MCG TABS Take 10,000 mcg by mouth daily.   . Calcium Citrate-Vitamin D (CALCIUM CITRATE + D3 PO) Take 1 tablet by mouth 2 (two) times daily.   . cetirizine (ZYRTEC) 10 MG tablet TAKE 1 TO 2 TABLETS TWICE DAILY (Patient taking differently: Take 10 mg by mouth 2 (two) times daily. )   . cyclobenzaprine (FLEXERIL) 5 MG tablet Take 1 tablet (5 mg total) by mouth 3 (three) times daily as needed for muscle spasms.   [name] EPINEPHrine 0.3 mg/0.3 mL IJ SOAJ injection Use as directed for severe allergic reaction (Patient taking differently: Inject 0.3 mg into the muscle as needed for anaphylaxis. )   . famotidine (PEPCID) 20 MG tablet Take 1 tablet (20 mg total) by mouth daily. Take 1 tablet twice a day (Patient taking differently: Take 20 mg by mouth daily. )   . fluorouracil (EFUDEX) 5 % cream Apply 1 application topically daily as needed  (cancer spots).    . hydrOXYzine (ATARAX/VISTARIL) 25 MG tablet TAKE 1 TABLET THREE TIMES DAILY AS NEEDED FOR ITCHING (Patient taking differently: Take 25 mg by mouth 3 (three) times daily as needed for itching. ) 04/28/2019: AS NEEDED.  [name] levothyroxine (SYNTHROID) 125 MCG tablet Take 1 tablet (125 mcg total) by mouth daily. (Patient taking differently: Take 125 mcg by mouth daily before breakfast. )   . losartan (COZAAR) 25 MG tablet TAKE ONE (1) TABLET EACH DAY (Patient taking differently: Take 25 mg by mouth daily. )   . metoprolol succinate (TOPROL XL) 25 MG 24 hr tablet Take 1 tablet (25 mg total) by mouth daily.   . Multiple Vitamin (MULTIVITAMIN WITH MINERALS) TABS tablet Take 1 tablet by mouth daily.   . ondansetron (ZOFRAN) 4 MG tablet Take 1 tablet (4 mg total) by mouth every 8 (eight) hours as needed for nausea or vomiting. (Patient not taking: Reported on 12/03/2019)   . oxyCODONE-acetaminophen (PERCOCET) 5-325 MG tablet Take 1 tablet by mouth every 4 (four) hours as needed (max 6 q). (Patient not taking: Reported on 12/03/2019)   . pantoprazole (PROTONIX) 40 MG tablet Take 1 tablet (40 mg total) by mouth 2 (two) times daily. (Patient taking differently: Take 40 mg by mouth daily. )   . ursodiol (ACTIGALL) 250 MG tablet Take 250 mg by mouth 2 (two) times daily.    . valACYclovir (VALTREX) 500 MG tablet TAKE ONE (1) TABLET THREE (3) TIMES EACH DAY (Patient not taking: No sig reported)   . vitamin B-12 (CYANOCOBALAMIN) 1000 MCG tablet Take 1,000 mcg by mouth daily.   . Vitamin D, Ergocalciferol, (DRISDOL) 1.25 MG (50000 UNIT) CAPS capsule TAKE 1 CAPSULE EVERY WEEK (Patient taking differently: Take 50,000 Units by mouth every Friday. )   . vitamin E 180 MG (400 UNITS) capsule Take 400 Units by mouth daily.   [name] ZORTRESS 0.5 MG TABS Take 2 mg by mouth 2 (two) times daily.     No facility-administered encounter medications  on file as of 12/08/2019.    ALLERGIES:  Allergies  Allergen Reactions    . Ciprofloxacin Itching  . Codeine Nausea Only     Vital signs: -Defer to telephone visit  Physical Exam -Deferred due to telephone visit -Patient was alert and oriented over the phone and in no acute distress.  LABORATORY DATA:  I have reviewed the labs as listed.  CBC    Component Value Date/Time   WBC 7.5 12/07/2019 1355   RBC 4.57 12/07/2019 1355   HGB 11.5 (L) 12/07/2019 1355   HGB 11.1 04/13/2019 0940   HCT 38.9 12/07/2019 1355   HCT 34.5 04/13/2019 0940   PLT 358 12/07/2019 1355   PLT 380 04/13/2019 0940   MCV 85.1 12/07/2019 1355   MCV 85 04/13/2019 0940   MCH 25.2 (L) 12/07/2019 1355   MCHC 29.6 (L) 12/07/2019 1355   RDW Not Measured 12/07/2019 1355   RDW 13.0 04/13/2019 0940   LYMPHSABS 2.3 12/07/2019 1355   LYMPHSABS 2.4 04/13/2019 0940   MONOABS 0.9 12/07/2019 1355   EOSABS 0.7 (H) 12/07/2019 1355   EOSABS 0.5 (H) 04/13/2019 0940   BASOSABS 0.1 12/07/2019 1355   BASOSABS 0.2 04/13/2019 0940   CMP Latest Ref Rng & Units 12/07/2019 11/11/2019 10/22/2019  Glucose 70 - 99 mg/dL 104(H) 97 138(H)  BUN 8 - 23 mg/dL 24(H) 12 16  Creatinine 0.44 - 1.00 mg/dL 0.82 0.60 0.91  Sodium 135 - 145 mmol/L 136 138 138  Potassium 3.5 - 5.1 mmol/L 4.5 4.0 4.1  Chloride 98 - 111 mmol/L 101 105 101  CO2 22 - 32 mmol/L 26 23 24   Calcium 8.9 - 10.3 mg/dL 8.9 8.8(L) 9.1  Total Protein 6.5 - 8.1 g/dL 7.2 7.1 6.8  Total Bilirubin 0.3 - 1.2 mg/dL 0.5 0.5 0.2(L)  Alkaline Phos 38 - 126 U/L 168(H) 199(H) 204(H)  AST 15 - 41 U/L 26 28 25   ALT 0 - 44 U/L 18 20 17     All questions were answered to patient's stated satisfaction. Encouraged patient to call with any new concerns or questions before his next visit to the cancer center and we can certain see him sooner, if needed.      ASSESSMENT & PLAN:   Iron deficiency anemia 1. Iron deficiency anemia: -She was unable to tolerate oral iron therapy in the past. -Last received Feraheme on 11/11/2019. -She denies any bleeding  per rectum or melena. -She had a colonoscopy in the past secondary to Lynch syndrome. -Last visit patient's hemoglobin was 7.4.  Due to the patient's significant fatigue she received 1 unit of packed red blood cells.  Then 2 infusions of iron. -She is scheduled for an ERCP at [facility] done on 12/07/2019 showed hemoglobin 11.5, ferritin 105, WBC 7.5, platelets 358 -I recommend 2 infusions of IV iron. -We will see her back in 3 months with repeat labs.  2.  Thrombocytosis: -She was initially worked up for thrombocytosis with negative JAK2 mutations and BCR/ABL. -This is likely reactive to iron deficiency anemia.  3.  Primary biliary cirrhosis: -She had orthotopic liver transplant in 2015 at [facility]. -She is currently on sirolimus, ursodiol, valtrex.  4.  Osteoporosis: -She is on Fosamax 70 mg weekly.      Orders placed this encounter:  Orders Placed This Encounter  Procedures  . Lactate dehydrogenase  . CBC with Differential/Platelet  . Comprehensive metabolic panel  . Ferritin  . Iron and TIBC  . Vitamin  B12  . VITAMIN D 25 Hydroxy (Vit-D Deficiency, Fractures)  . Folate    I provided 18 minutes of non face-to-face telephone visit time during this encounter, and > 50% was spent counseling as documented under my assessment & plan.  Francene Finders, FNP-C [place] [phone]

## 2019-12-08 NOTE — Assessment & Plan Note (Addendum)
1. Iron deficiency anemia: -She was unable to tolerate oral iron therapy in the past. -Last received Feraheme on 11/11/2019. -She denies any bleeding per rectum or melena. -She had a colonoscopy in the past secondary to Lynch syndrome. -Last visit patient's hemoglobin was 7.4.  Due to the patient's significant fatigue she received 1 unit of packed red blood cells.  Then 2 infusions of iron. -She is scheduled for an ERCP at [place] done on 12/07/2019 showed hemoglobin 11.5, ferritin 105, WBC 7.5, platelets 358 -I recommend 2 infusions of IV iron. -We will see her back in 3 months with repeat labs.  2.  Thrombocytosis: -She was initially worked up for thrombocytosis with negative JAK2 mutations and BCR/ABL. -This is likely reactive to iron deficiency anemia.  3.  Primary biliary cirrhosis: -She had orthotopic liver transplant in 2015 at [facility]. -She is currently on sirolimus, ursodiol, valtrex.  4.  Osteoporosis: -She is on Fosamax 70 mg weekly.

## 2019-12-08 NOTE — Patient Instructions (Signed)
[facility] at [facility] Discharge Instructions     Thank you for choosing [facility] at [facility] to provide your oncology and hematology care.  To afford each patient quality time with our provider, please arrive at least 15 minutes before your scheduled appointment time.   If you have a lab appointment with the Cancer Center please come in thru the Main Entrance and check in at the main information desk.  You need to re-schedule your appointment should you arrive 10 or more minutes late.  We strive to give you quality time with our providers, and arriving late affects you and other patients whose appointments are after yours.  Also, if you no show three or more times for appointments you may be dismissed from the clinic at the providers discretion.     Again, thank you for choosing [facility].  Our hope is that these requests will decrease the amount of time that you wait before being seen by our physicians.       _____________________________________________________________  Should you have questions after your visit to [facility], please contact our office at [phone] between the hours of 8:00 a.m. and 4:30 p.m.  Voicemails left after 4:00 p.m. will not be returned until the following business day.  For prescription refill requests, have your pharmacy contact our office and allow 72 hours.    Due to Covid, you will need to wear a mask upon entering the hospital. If you do not have a mask, a mask will be given to you at the Main Entrance upon arrival. For doctor visits, patients may have 1 support person with them. For treatment visits, patients can not have anyone with them due to social distancing guidelines and our immunocompromised population.      

## 2019-12-09 ENCOUNTER — Ambulatory Visit (HOSPITAL_COMMUNITY): Payer: Medicare Other | Admitting: Hematology

## 2019-12-10 ENCOUNTER — Inpatient Hospital Stay (HOSPITAL_COMMUNITY): Payer: Medicare Other

## 2019-12-10 ENCOUNTER — Encounter (HOSPITAL_COMMUNITY): Payer: Self-pay

## 2019-12-10 ENCOUNTER — Ambulatory Visit (HOSPITAL_COMMUNITY): Payer: Medicare Other

## 2019-12-10 ENCOUNTER — Other Ambulatory Visit: Payer: Self-pay

## 2019-12-10 VITALS — BP 140/68 | HR 64 | Temp 96.9°F | Resp 18

## 2019-12-10 DIAGNOSIS — D508 Other iron deficiency anemias: Secondary | ICD-10-CM

## 2019-12-10 DIAGNOSIS — Z79899 Other long term (current) drug therapy: Secondary | ICD-10-CM | POA: Diagnosis not present

## 2019-12-10 DIAGNOSIS — Z1509 Genetic susceptibility to other malignant neoplasm: Secondary | ICD-10-CM | POA: Diagnosis not present

## 2019-12-10 DIAGNOSIS — D509 Iron deficiency anemia, unspecified: Secondary | ICD-10-CM | POA: Diagnosis not present

## 2019-12-10 MED ORDER — SODIUM CHLORIDE 0.9 % IV SOLN: INTRAVENOUS | Status: DC

## 2019-12-10 MED ORDER — SODIUM CHLORIDE 0.9 % IV SOLN
510.0000 mg | Freq: Once | INTRAVENOUS | Status: AC
  Administered 2019-12-10: 510 mg via INTRAVENOUS
  Filled 2019-12-10: qty 17

## 2019-12-10 NOTE — Progress Notes (Signed)
Patient presents today for Feraheme infusion. MAR reviewed and updated. Patient has no complaints of any pain today. Patient denies any changes since her last visit. Vital signs stable.   Feraheme given today per MD orders. Tolerated infusion without adverse affects. Vital signs stable. No complaints at this time. Discharged from clinic ambulatory. F/U with [facility] as scheduled.

## 2019-12-10 NOTE — Patient Instructions (Signed)
[facility] at [facility]  Discharge Instructions:   _______________________________________________________________  Thank you for choosing [facility] at [facility] to provide your oncology and hematology care.  To afford each patient quality time with our providers, please arrive at least 15 minutes before your scheduled appointment.  You need to re-schedule your appointment if you arrive 10 or more minutes late.  We strive to give you quality time with our providers, and arriving late affects you and other patients whose appointments are after yours.  Also, if you no show three or more times for appointments you may be dismissed from the clinic.  Again, thank you for choosing [facility] at [facility]. Our hope is that these requests will allow you access to exceptional care and in a timely manner. _______________________________________________________________  If you have questions after your visit, please contact our office at [phone] between the hours of 8:30 a.m. and 5:00 p.m. Voicemails left after 4:30 p.m. will not be returned until the following business day. _______________________________________________________________  For prescription refill requests, have your pharmacy contact our office. _______________________________________________________________  Recommendations made by the consultant and any test results will be sent to your referring physician. _______________________________________________________________ 

## 2019-12-14 ENCOUNTER — Encounter (HOSPITAL_COMMUNITY): Payer: Self-pay

## 2019-12-14 ENCOUNTER — Other Ambulatory Visit: Payer: Self-pay

## 2019-12-14 ENCOUNTER — Other Ambulatory Visit (HOSPITAL_COMMUNITY): Payer: Medicare Other

## 2019-12-14 ENCOUNTER — Other Ambulatory Visit (HOSPITAL_COMMUNITY)
Admission: RE | Admit: 2019-12-14 | Discharge: 2019-12-14 | Disposition: A | Discharge status: Home / Self Care | Payer: Medicare Other | Source: Ambulatory Visit | Attending: Internal Medicine | Admitting: Internal Medicine

## 2019-12-14 ENCOUNTER — Encounter (HOSPITAL_COMMUNITY)
Admission: RE | Admit: 2019-12-14 | Discharge: 2019-12-14 | Disposition: A | Discharge status: Home / Self Care | Payer: Medicare Other | Source: Ambulatory Visit | Attending: Internal Medicine | Admitting: Internal Medicine

## 2019-12-14 DIAGNOSIS — Z20822 Contact with and (suspected) exposure to covid-19: Secondary | ICD-10-CM | POA: Insufficient documentation

## 2019-12-14 DIAGNOSIS — Z01812 Encounter for preprocedural laboratory examination: Secondary | ICD-10-CM | POA: Diagnosis not present

## 2019-12-14 LAB — SARS CORONAVIRUS 2 (TAT 6-24 HRS): SARS Coronavirus 2: NEGATIVE

## 2019-12-14 NOTE — Patient Instructions (Signed)
Cassidy Barber  12/14/2019     @PREFPERIOPPHARMACY @   Your procedure is scheduled on  12/16/2019 .  Report to [name] at  [phone]  A.M.  Call this number if you have problems the morning of surgery:  [phone]   Remember:  Follow the diet and prep instructions given to you by Dr Olevia Perches office.                      Take these medicines the morning of surgery with A SIP OF WATER  Zyrtec, pepcid, protonix, levothyroxine, losartan, metoprolol, zortress, actigall.    Do not wear jewelry, make-up or nail polish.  Do not wear lotions, powders, or perfumes. Please brush your teeth and wear deodorant.  Do not shave 48 hours prior to surgery.  Men may shave face and neck.  Do not bring valuables to the hospital.  [facility] is not responsible for any belongings or valuables.  Contacts, dentures or bridgework may not be worn into surgery.  Leave your suitcase in the car.  After surgery it may be brought to your room.  For patients admitted to the hospital, discharge time will be determined by your treatment team.  Patients discharged the day of surgery will not be allowed to drive home.   Name and phone number of your driver:   family Special instructions:  None  Please read over the following fact sheets that you were given. Anesthesia Post-op Instructions and Care and Recovery After Surgery       Upper Endoscopy, Adult, Care After This sheet gives you information about how to care for yourself after your procedure. Your health care provider may also give you more specific instructions. If you have problems or questions, contact your health care provider. What can I expect after the procedure? After the procedure, it is common to have:  A sore throat.  Mild stomach pain or discomfort.  Bloating.  Nausea. Follow these instructions at home:   Follow instructions from your health care provider about what to eat or drink after your procedure.  Return to your  normal activities as told by your health care provider. Ask your health care provider what activities are safe for you.  Take over-the-counter and prescription medicines only as told by your health care provider.  Do not drive for 24 hours if you were given a sedative during your procedure.  Keep all follow-up visits as told by your health care provider. This is important. Contact a health care provider if you have:  A sore throat that lasts longer than one day.  Trouble swallowing. Get help right away if:  You vomit blood or your vomit looks like coffee grounds.  You have: ? A fever. ? Bloody, black, or tarry stools. ? A severe sore throat or you cannot swallow. ? Difficulty breathing. ? Severe pain in your chest or abdomen. Summary  After the procedure, it is common to have a sore throat, mild stomach discomfort, bloating, and nausea.  Do not drive for 24 hours if you were given a sedative during the procedure.  Follow instructions from your health care provider about what to eat or drink after your procedure.  Return to your normal activities as told by your health care provider. This information is not intended to replace advice given to you by your health care provider. Make sure you discuss any questions you have with your health care provider. Document  Revised: 04/22/2018 Document Reviewed: 03/31/2018 Elsevier Patient Education  [place].  Flexible Sigmoidoscopy, Care After This sheet gives you information about how to care for yourself after your procedure. Your health care provider may also give you more specific instructions. If you have problems or questions, contact your health care provider. What can I expect after the procedure? After the procedure, it is common to have:  Abdominal cramping or pain.  Bloating.  A small amount of rectal bleeding if you had a biopsy. Follow these instructions at home:  Take over-the-counter and prescription  medicines only as told by your health care provider.  Do not drive for 24 hours if you received a medicine to help you relax (sedative).  Keep all follow-up visits as told by your health care provider. This is important. Contact a health care provider if:  You have abdominal pain or cramping that gets worse or is not helped with medicine.  You continue to have small amounts of rectal bleeding after 24 hours.  You have nausea or vomiting.  You feel weak or dizzy.  You have a fever. Get help right away if:  You pass large blood clots or see a large amount of blood in the toilet after having a bowel movement.  You have nausea or vomiting for more than 24 hours after the procedure. This information is not intended to replace advice given to you by your health care provider. Make sure you discuss any questions you have with your health care provider. Document Revised: 06/21/2016 Document Reviewed: 01/28/2016 Elsevier Patient Education  [place] After These instructions provide you with information about caring for yourself after your procedure. Your health care provider may also give you more specific instructions. Your treatment has been planned according to current medical practices, but problems sometimes occur. Call your health care provider if you have any problems or questions after your procedure. What can I expect after the procedure? After your procedure, you may:  Feel sleepy for several hours.  Feel clumsy and have poor balance for several hours.  Feel forgetful about what happened after the procedure.  Have poor judgment for several hours.  Feel nauseous or vomit.  Have a sore throat if you had a breathing tube during the procedure. Follow these instructions at home: For at least 24 hours after the procedure:      Have a responsible adult stay with you. It is important to have someone help care for you until you are awake  and alert.  Rest as needed.  Do not: ? Participate in activities in which you could fall or become injured. ? Drive. ? Use heavy machinery. ? Drink alcohol. ? Take sleeping pills or medicines that cause drowsiness. ? Make important decisions or sign legal documents. ? Take care of children on your own. Eating and drinking  Follow the diet that is recommended by your health care provider.  If you vomit, drink water, juice, or soup when you can drink without vomiting.  Make sure you have little or no nausea before eating solid foods. General instructions  Take over-the-counter and prescription medicines only as told by your health care provider.  If you have sleep apnea, surgery and certain medicines can increase your risk for breathing problems. Follow instructions from your health care provider about wearing your sleep device: ? Anytime you are sleeping, including during daytime naps. ? While taking prescription pain medicines, sleeping medicines, or medicines that make you drowsy.  If you smoke, do not smoke without supervision.  Keep all follow-up visits as told by your health care provider. This is important. Contact a health care provider if:  You keep feeling nauseous or you keep vomiting.  You feel light-headed.  You develop a rash.  You have a fever. Get help right away if:  You have trouble breathing. Summary  For several hours after your procedure, you may feel sleepy and have poor judgment.  Have a responsible adult stay with you for at least 24 hours or until you are awake and alert. This information is not intended to replace advice given to you by your health care provider. Make sure you discuss any questions you have with your health care provider. Document Revised: 01/27/2018 Document Reviewed: 02/19/2016 Elsevier Patient Education  [place].

## 2019-12-16 ENCOUNTER — Ambulatory Visit (HOSPITAL_COMMUNITY): Payer: Medicare Other | Admitting: Anesthesiology

## 2019-12-16 ENCOUNTER — Ambulatory Visit (HOSPITAL_COMMUNITY)
Admission: RE | Admit: 2019-12-16 | Discharge: 2019-12-16 | Disposition: A | Discharge status: Home / Self Care | Payer: Medicare Other | Attending: Internal Medicine | Admitting: Internal Medicine

## 2019-12-16 ENCOUNTER — Encounter (HOSPITAL_COMMUNITY)
Admission: RE | Disposition: A | Discharge status: Home / Self Care | Payer: Self-pay | Source: Home / Self Care | Attending: Internal Medicine

## 2019-12-16 DIAGNOSIS — Z860100 Personal history of colon polyps, unspecified: Secondary | ICD-10-CM | POA: Insufficient documentation

## 2019-12-16 DIAGNOSIS — M8580 Other specified disorders of bone density and structure, unspecified site: Secondary | ICD-10-CM | POA: Insufficient documentation

## 2019-12-16 DIAGNOSIS — Z980 Intestinal bypass and anastomosis status: Secondary | ICD-10-CM

## 2019-12-16 DIAGNOSIS — I10 Essential (primary) hypertension: Secondary | ICD-10-CM | POA: Insufficient documentation

## 2019-12-16 DIAGNOSIS — Z79890 Hormone replacement therapy (postmenopausal): Secondary | ICD-10-CM | POA: Diagnosis not present

## 2019-12-16 DIAGNOSIS — Z7982 Long term (current) use of aspirin: Secondary | ICD-10-CM | POA: Insufficient documentation

## 2019-12-16 DIAGNOSIS — Z944 Liver transplant status: Secondary | ICD-10-CM | POA: Insufficient documentation

## 2019-12-16 DIAGNOSIS — Z1509 Genetic susceptibility to other malignant neoplasm: Secondary | ICD-10-CM | POA: Insufficient documentation

## 2019-12-16 DIAGNOSIS — Z79899 Other long term (current) drug therapy: Secondary | ICD-10-CM | POA: Diagnosis not present

## 2019-12-16 DIAGNOSIS — R1314 Dysphagia, pharyngoesophageal phase: Secondary | ICD-10-CM | POA: Diagnosis not present

## 2019-12-16 DIAGNOSIS — E039 Hypothyroidism, unspecified: Secondary | ICD-10-CM | POA: Diagnosis not present

## 2019-12-16 DIAGNOSIS — Z800 Family history of malignant neoplasm of digestive organs: Secondary | ICD-10-CM | POA: Insufficient documentation

## 2019-12-16 DIAGNOSIS — Z881 Allergy status to other antibiotic agents status: Secondary | ICD-10-CM | POA: Diagnosis not present

## 2019-12-16 DIAGNOSIS — Z1507 Genetic susceptibility to malignant neoplasm of urinary tract: Secondary | ICD-10-CM | POA: Insufficient documentation

## 2019-12-16 DIAGNOSIS — Z1211 Encounter for screening for malignant neoplasm of colon: Secondary | ICD-10-CM | POA: Diagnosis not present

## 2019-12-16 DIAGNOSIS — Z885 Allergy status to narcotic agent status: Secondary | ICD-10-CM | POA: Insufficient documentation

## 2019-12-16 DIAGNOSIS — K6289 Other specified diseases of anus and rectum: Secondary | ICD-10-CM | POA: Insufficient documentation

## 2019-12-16 DIAGNOSIS — R0789 Other chest pain: Secondary | ICD-10-CM

## 2019-12-16 DIAGNOSIS — Z15068 Genetic susceptibility to other malignant neoplasm of digestive system: Secondary | ICD-10-CM

## 2019-12-16 DIAGNOSIS — Z85038 Personal history of other malignant neoplasm of large intestine: Secondary | ICD-10-CM | POA: Diagnosis not present

## 2019-12-16 DIAGNOSIS — K219 Gastro-esophageal reflux disease without esophagitis: Secondary | ICD-10-CM | POA: Diagnosis not present

## 2019-12-16 DIAGNOSIS — R079 Chest pain, unspecified: Secondary | ICD-10-CM | POA: Insufficient documentation

## 2019-12-16 DIAGNOSIS — Z08 Encounter for follow-up examination after completed treatment for malignant neoplasm: Secondary | ICD-10-CM | POA: Diagnosis not present

## 2019-12-16 DIAGNOSIS — K31819 Angiodysplasia of stomach and duodenum without bleeding: Secondary | ICD-10-CM | POA: Diagnosis not present

## 2019-12-16 DIAGNOSIS — L509 Urticaria, unspecified: Secondary | ICD-10-CM

## 2019-12-16 HISTORY — PX: ESOPHAGOGASTRODUODENOSCOPY (EGD) WITH PROPOFOL: SHX5813

## 2019-12-16 HISTORY — PX: FLEXIBLE SIGMOIDOSCOPY: SHX5431

## 2019-12-16 SURGERY — SIGMOIDOSCOPY, FLEXIBLE
Anesthesia: General

## 2019-12-16 SURGERY — SIGMOIDOSCOPY, FLEXIBLE
Anesthesia: Moderate Sedation

## 2019-12-16 MED ORDER — EPHEDRINE SULFATE 50 MG/ML IJ SOLN
INTRAMUSCULAR | Status: DC | PRN
  Administered 2019-12-16: 5 mg via INTRAVENOUS

## 2019-12-16 MED ORDER — PROPOFOL 10 MG/ML IV BOLUS
INTRAVENOUS | Status: AC
  Filled 2019-12-16: qty 20

## 2019-12-16 MED ORDER — PROPOFOL 500 MG/50ML IV EMUL
INTRAVENOUS | Status: DC | PRN
  Administered 2019-12-16: 150 ug/kg/min via INTRAVENOUS

## 2019-12-16 MED ORDER — LACTATED RINGERS IV SOLN: INTRAVENOUS | Status: DC

## 2019-12-16 MED ORDER — LIDOCAINE HCL (CARDIAC) PF 100 MG/5ML IV SOSY
PREFILLED_SYRINGE | INTRAVENOUS | Status: DC | PRN
  Administered 2019-12-16: 50 mg via INTRAVENOUS

## 2019-12-16 MED ORDER — PHENYLEPHRINE 40 MCG/ML (10ML) SYRINGE FOR IV PUSH (FOR BLOOD PRESSURE SUPPORT)
PREFILLED_SYRINGE | INTRAVENOUS | Status: AC
  Filled 2019-12-16: qty 10

## 2019-12-16 MED ORDER — CHLORHEXIDINE GLUCONATE CLOTH 2 % EX PADS: 6.0000 | MEDICATED_PAD | Freq: Once | CUTANEOUS | Status: DC

## 2019-12-16 MED ORDER — MIDAZOLAM HCL 2 MG/2ML IJ SOLN: 0.5000 mg | Freq: Once | INTRAMUSCULAR | Status: DC | PRN

## 2019-12-16 MED ORDER — PHENYLEPHRINE HCL (PRESSORS) 10 MG/ML IV SOLN
INTRAVENOUS | Status: DC | PRN
  Administered 2019-12-16 (×4): 100 ug via INTRAVENOUS

## 2019-12-16 MED ORDER — DEXAMETHASONE SODIUM PHOSPHATE 10 MG/ML IJ SOLN: INTRAMUSCULAR | Status: DC | PRN

## 2019-12-16 MED ORDER — PROPOFOL 10 MG/ML IV BOLUS
INTRAVENOUS | Status: DC | PRN
  Administered 2019-12-16: 70 mg via INTRAVENOUS
  Administered 2019-12-16: 30 mg via INTRAVENOUS

## 2019-12-16 MED ORDER — LIDOCAINE 2% (20 MG/ML) 5 ML SYRINGE
INTRAMUSCULAR | Status: AC
  Filled 2019-12-16: qty 5

## 2019-12-16 MED ORDER — PROMETHAZINE HCL 25 MG/ML IJ SOLN: 6.2500 mg | INTRAMUSCULAR | Status: DC | PRN

## 2019-12-16 MED ORDER — FAMOTIDINE 20 MG PO TABS: 20.0000 mg | ORAL_TABLET | Freq: Every day | ORAL | 1 refills | Status: DC

## 2019-12-16 NOTE — Anesthesia Preprocedure Evaluation (Signed)
Anesthesia Evaluation  Patient identified by MRN, date of birth, ID band Patient awake    Reviewed: Allergy & Precautions, NPO status , Patient's Chart, lab work & pertinent test results, reviewed documented beta blocker date and time   Airway Mallampati: I  TM Distance: >3 FB Neck ROM: Full    Dental no notable dental hx. (+) Teeth Intact   Pulmonary neg pulmonary ROS,    Pulmonary exam normal breath sounds clear to auscultation       Cardiovascular Exercise Tolerance: Good hypertension, Pt. on home beta blockers and Pt. on medications negative cardio ROS Normal cardiovascular examI Rhythm:Regular Rate:Normal     Neuro/Psych Seizures -, Well Controlled,  Anxiety 6 years ago right after Liver transplant - no current meds negative psych ROS   GI/Hepatic Neg liver ROS, GERD  Medicated and Controlled,Here for GI surveillance    Endo/Other  Hypothyroidism   Renal/GU negative Renal ROS  negative genitourinary   Musculoskeletal negative musculoskeletal ROS (+)   Abdominal   Peds negative pediatric ROS (+)  Hematology negative hematology ROS (+) anemia ,   Anesthesia Other Findings Lynch syndrome -s/p Colectomy  S/p Liver transplant 6 years ago   Reproductive/Obstetrics negative OB ROS                             Anesthesia Physical Anesthesia Plan  ASA: III  Anesthesia Plan: General   Post-op Pain Management:    Induction: Intravenous  PONV Risk Score and Plan: 3 and TIVA, Propofol infusion and Ondansetron  Airway Management Planned: Nasal Cannula and Simple Face Mask  Additional Equipment:   Intra-op Plan:   Post-operative Plan:   Informed Consent: I have reviewed the patients History and Physical, chart, labs and discussed the procedure including the risks, benefits and alternatives for the proposed anesthesia with the patient or authorized representative who has indicated  his/her understanding and acceptance.     Dental advisory given  Plan Discussed with: CRNA  Anesthesia Plan Comments: (Plan Full PPE use  Plan GA with GETA as needed d/w pt -WTP with same after Q&A)        Anesthesia Quick Evaluation

## 2019-12-16 NOTE — Transfer of Care (Signed)
Immediate Anesthesia Transfer of Care Note  Patient: Cassidy Barber  Procedure(s) Performed: FLEXIBLE SIGMOIDOSCOPY wirh Propofol (N/A ) ESOPHAGOGASTRODUODENOSCOPY (EGD) WITH PROPOFOL (N/A )  Patient Location: PACU  Anesthesia Type:MAC  Level of Consciousness: awake, alert , oriented and patient cooperative  Airway & Oxygen Therapy: Patient Spontanous Breathing  Post-op Assessment: stable  Post vital signs: Reviewed and stable  Last Vitals:  Vitals Value Taken Time  BP 119/58 12/16/19 0804  Temp 36.3 C 12/16/19 0758  Pulse 84 12/16/19 0804  Resp 19 12/16/19 0804  SpO2 98 % 12/16/19 0804  Vitals shown include unvalidated device data.  Last Pain:  Vitals:   12/16/19 0758  TempSrc:   PainSc: Asleep      Patients Stated Pain Goal: 5 (33/29/51 8841)  Complications: No apparent anesthesia complications

## 2019-12-16 NOTE — H&P (Signed)
Cassidy Barber is an 69 y.o. female.   Chief Complaint: Patient is here for esophagogastroduodenoscopy possible esophageal dilation and a flexible sigmoidoscopy. HPI: Patient is 69 year old Caucasian female who has chronic GERD and maintained on acid suppression who has been experiencing intermittent chest pain as well as dysphagia over the last few weeks.  She says her dysphagia has subsided and chest pain is still occurring but not as frequently.  She did not experience palpitations or shortness of breath or diaphoresis with this chest pain.  She has not had any heartburn though.  She has lost 10 pounds.  She states she eats 2 meals a day.  She states her appetite is not as it used to be.  She states she had EKG during chest pain was normal. She is also undergoing surveillance flexible sigmoidoscopy as she has history of colon carcinoma and she also carries gene for Lynch syndrome.  Last sigmoidoscopy was in October 2019. Does not take aspirin or anticoagulants she is on alendronate. Only history is positive for CRC in her father who was in his 28s at the time of diagnosis.  Her sister had colon carcinoma in her 73s and she is doing fine and her niece also has been treated for colon carcinoma.  Past Medical History:  Diagnosis Date  . Abdominal wall hernia    Incarcerated status post surgical repair 2019 - [facility]  . Anemia of chronic disease   . Colon cancer [facility])    Colon surgery 2005 and 2012  . History of pulmonary hypertension    Pre liver transplant  . History of seizures   . Hypertension   . Hypothyroidism   . Lynch syndrome   . Osteopenia   . Primary biliary cirrhosis (HCC)    Status post liver transplantation - follows at [facility]    Past Surgical History:  Procedure Laterality Date  . ABDOMINAL HERNIA REPAIR     Patient's states that she has had 8- 9 hernia surgeries  . ABDOMINAL HYSTERECTOMY    . CHOLECYSTECTOMY  2007  . COLON RESECTION    . COLON SURGERY  2008   Done at [facility]  .  COLONOSCOPY     Done at [facility]  . ESOPHAGOGASTRODUODENOSCOPY N/A 08/21/2018   Procedure: ESOPHAGOGASTRODUODENOSCOPY (EGD);  Surgeon: Rogene Houston, MD;  [facility];  Service: Endoscopy;  Laterality: N/A;  . EYE SURGERY     lasix  . FLEXIBLE SIGMOIDOSCOPY N/A 10/20/2015   Procedure: FLEXIBLE SIGMOIDOSCOPY;  Surgeon: Rogene Houston, MD;  [facility];  Service: Endoscopy;  Laterality: N/A;  72 - Dr Laural Golden has meeting until 1:00  . FLEXIBLE SIGMOIDOSCOPY N/A 07/11/2016   Procedure: FLEXIBLE SIGMOIDOSCOPY;  Surgeon: Rogene Houston, MD;  [facility];  Service: Endoscopy;  Laterality: N/A;  1200  . FLEXIBLE SIGMOIDOSCOPY N/A 08/09/2017   Procedure: FLEXIBLE SIGMOIDOSCOPY;  Surgeon: Rogene Houston, MD;  [facility];  Service: Endoscopy;  Laterality: N/A;  1:00  . FLEXIBLE SIGMOIDOSCOPY N/A 08/21/2018   Procedure: FLEXIBLE SIGMOIDOSCOPY;  Surgeon: Rogene Houston, MD;  [facility];  Service: Endoscopy;  Laterality: N/A;  . FRACTURE SURGERY     right wrist metal plate  . HERNIA REPAIR    . LIVER TRANSPLANT  [id number]  . multiple skin cancers removed    . POLYPECTOMY  08/09/2017   Procedure: POLYPECTOMY;  Surgeon: Rogene Houston, MD;  [facility];  Service: Endoscopy;;  colon small bowel  .  REVERSE SHOULDER ARTHROPLASTY Left 07/17/2018  . REVERSE SHOULDER ARTHROPLASTY Left 07/17/2018   Procedure: LEFT REVERSE SHOULDER ARTHROPLASTY;  Surgeon: Justice Britain, MD;  Location: [place];  Service: Orthopedics;  Laterality: Left;  159min  . SHOULDER CLOSED REDUCTION Left 09/27/2019   Procedure: CLOSED REDUCTION SHOULDER;  Surgeon: Paralee Cancel, MD;  Location: [facility];  Service: Orthopedics;  Laterality: Left;  . SPLENECTOMY  2006  . TOTAL SHOULDER REVISION Left 11/12/2019   Procedure: Revision Left Reverse Shoulder Arthroplasty with poly exchange SDD;  Surgeon: Justice Britain, MD;  Location: [facility];  Service: Orthopedics;  Laterality:  Left;  172min -SDDC  . TYMPANOSTOMY TUBE PLACEMENT    . UPPER GASTROINTESTINAL ENDOSCOPY     Done at [facility]    Family History  Problem Relation Age of Onset  . Prostate cancer Father   . Colon cancer Father   . Colon cancer Sister   . Lung cancer Sister   . Healthy Son   . Alcohol abuse Brother   . Allergic rhinitis Neg Hx   . Asthma Neg Hx   . Eczema Neg Hx   . Urticaria Neg Hx    Social History:  reports that she has never smoked. She has never used smokeless tobacco. She reports that she does not drink alcohol or use drugs.  Allergies:  Allergies  Allergen Reactions  . Ciprofloxacin Itching  . Codeine Nausea Only    Medications Prior to Admission  Medication Sig Dispense Refill  . acetaminophen (TYLENOL) 500 MG tablet Take 1,000 mg by mouth every 6 (six) hours as needed (for pain.).     [name] alendronate (FOSAMAX) 70 MG tablet Take 1 tablet (70 mg total) by mouth once a week. Take with a full glass of water on an empty stomach. (Patient taking differently: Take 70 mg by mouth every Thursday. Take with a full glass of water on an empty stomach.) 12 tablet 0  . ALPRAZolam (XANAX) 0.5 MG tablet Take 1 tablet (0.5 mg total) by mouth at bedtime. 90 tablet 1  . aspirin EC 81 MG tablet Take 81 mg by mouth daily.    . Biotin 10000 MCG TABS Take 10,000 mcg by mouth daily.    . Calcium Citrate-Vitamin D (CALCIUM CITRATE + D3 PO) Take 1 tablet by mouth 2 (two) times daily.    . cetirizine (ZYRTEC) 10 MG tablet TAKE 1 TO 2 TABLETS TWICE DAILY (Patient taking differently: Take 10 mg by mouth 2 (two) times daily. ) 120 tablet 5  . cyclobenzaprine (FLEXERIL) 5 MG tablet Take 1 tablet (5 mg total) by mouth 3 (three) times daily as needed for muscle spasms. 60 tablet 1  . EPINEPHrine 0.3 mg/0.3 mL IJ SOAJ injection Use as directed for severe allergic reaction (Patient taking differently: Inject 0.3 mg into the muscle as needed for anaphylaxis. ) 2 Device 2  . famotidine (PEPCID) 20 MG tablet Take 1  tablet (20 mg total) by mouth daily. Take 1 tablet twice a day (Patient taking differently: Take 20 mg by mouth daily. ) 180 tablet 1  . fluorouracil (EFUDEX) 5 % cream Apply 1 application topically daily as needed (cancer spots).   0  . hydrOXYzine (ATARAX/VISTARIL) 25 MG tablet TAKE 1 TABLET THREE TIMES DAILY AS NEEDED FOR ITCHING (Patient taking differently: Take 25 mg by mouth 3 (three) times daily as needed for itching. ) 60 tablet 5  . levothyroxine (SYNTHROID) 125 MCG tablet Take 1 tablet (125 mcg total) by mouth daily. (Patient taking differently:  Take 125 mcg by mouth daily before breakfast. ) 30 tablet 0  . losartan (COZAAR) 25 MG tablet TAKE ONE (1) TABLET EACH DAY (Patient taking differently: Take 25 mg by mouth daily. ) 90 tablet 2  . metoprolol succinate (TOPROL XL) 25 MG 24 hr tablet Take 1 tablet (25 mg total) by mouth daily. 30 tablet 11  . Multiple Vitamin (MULTIVITAMIN WITH MINERALS) TABS tablet Take 1 tablet by mouth daily.    . pantoprazole (PROTONIX) 40 MG tablet Take 1 tablet (40 mg total) by mouth 2 (two) times daily. (Patient taking differently: Take 40 mg by mouth daily. ) 180 tablet 1  . ursodiol (ACTIGALL) 250 MG tablet Take 250 mg by mouth 2 (two) times daily.     . vitamin B-12 (CYANOCOBALAMIN) 1000 MCG tablet Take 1,000 mcg by mouth daily.    . Vitamin D, Ergocalciferol, (DRISDOL) 1.25 MG (50000 UNIT) CAPS capsule TAKE 1 CAPSULE EVERY WEEK (Patient taking differently: Take 50,000 Units by mouth every Friday. ) 12 capsule 2  . vitamin E 180 MG (400 UNITS) capsule Take 400 Units by mouth daily.    [name] ZORTRESS 0.5 MG TABS Take 2 mg by mouth 2 (two) times daily.     . ondansetron (ZOFRAN) 4 MG tablet Take 1 tablet (4 mg total) by mouth every 8 (eight) hours as needed for nausea or vomiting. 10 tablet 0  . oxyCODONE-acetaminophen (PERCOCET) 5-325 MG tablet Take 1 tablet by mouth every 4 (four) hours as needed (max 6 q). (Patient not taking: Reported on 12/03/2019) 20 tablet 0   . valACYclovir (VALTREX) 500 MG tablet TAKE ONE (1) TABLET THREE (3) TIMES EACH DAY 30 tablet 4    Results for orders placed or performed during the hospital encounter of 12/14/19 (from the past 48 hour(s))  SARS CORONAVIRUS 2 (TAT 6-24 HRS) Nasopharyngeal Nasopharyngeal Swab     Status: None   Collection Time: 12/14/19 10:15 AM   Specimen: Nasopharyngeal Swab  Result Value Ref Range   SARS Coronavirus 2 NEGATIVE NEGATIVE    Comment: (NOTE) SARS-CoV-2 target nucleic acids are NOT DETECTED. The SARS-CoV-2 RNA is generally detectable in upper and lower respiratory specimens during the acute phase of infection. Negative results do not preclude SARS-CoV-2 infection, do not rule out co-infections with other pathogens, and should not be used as the sole basis for treatment or other patient management decisions. Negative results must be combined with clinical observations, patient history, and epidemiological information. The expected result is Negative. Fact Sheet for Patients: SugarRoll.be Fact Sheet for Healthcare Providers: https://www.woods-mathews.com/ This test is not yet approved or cleared by the [place] FDA and  has been authorized for detection and/or diagnosis of SARS-CoV-2 by FDA under an Emergency Use Authorization (EUA). This EUA will remain  in effect (meaning this test can be used) for the duration of the COVID-19 declaration under Section 56 4(b)(1) of the Act, 21 U.S.C. section 360bbb-3(b)(1), unless the authorization is terminated or revoked sooner. Performed at [facility], [place] [address] [place], [place] [address]    No results found.  Review of Systems  Blood pressure 122/77, pulse 77, temperature 98 F (36.7 C), temperature source Oral, resp. rate 18, SpO2 98 %. Physical Exam  Constitutional: She appears well-developed and well-nourished.  HENT:  Mouth/Throat: Oropharynx is clear and moist.  Eyes:  Conjunctivae are normal. No scleral icterus.  Neck: No thyromegaly present.  Cardiovascular: Normal rate, regular rhythm and normal heart sounds.  No murmur heard. Respiratory:  Effort normal and breath sounds normal.  GI:  Diminished symmetrical and multiple scars.  On palpation is soft and nontender with organomegaly or masses.  Musculoskeletal:        General: No edema.  Lymphadenopathy:    She has no cervical adenopathy.  Neurological: She is alert.  Skin: Skin is warm and dry.     Assessment/Plan Noncardiac chest pain in a patient with history of GERD as well as dysphagia which has resolved. Personal history of colon carcinoma and patient carries gene for Lynch syndrome. [employer] gastroduodenoscopy with possible esophageal dilation and surveillance flexible sigmoidoscopy  Hildred Laser, MD 12/16/2019, 7:24 AM

## 2019-12-16 NOTE — Anesthesia Postprocedure Evaluation (Signed)
Anesthesia Post Note  Patient: Cassidy Barber  Procedure(s) Performed: FLEXIBLE SIGMOIDOSCOPY wirh Propofol (N/A ) ESOPHAGOGASTRODUODENOSCOPY (EGD) WITH PROPOFOL (N/A )  Patient location during evaluation: PACU Anesthesia Type: MAC Level of consciousness: awake, oriented, awake and alert and patient cooperative Pain management: pain level controlled Vital Signs Assessment: post-procedure vital signs reviewed and stable Respiratory status: spontaneous breathing, respiratory function stable and nonlabored ventilation Cardiovascular status: stable Postop Assessment: no apparent nausea or vomiting Anesthetic complications: no     Last Vitals:  Vitals:   12/16/19 0800 12/16/19 0804  BP: (!) 78/44 (!) 119/58  Pulse: 64 69  Resp: 19 19  Temp:    SpO2: 98% 97%    Last Pain:  Vitals:   12/16/19 0758  TempSrc:   PainSc: Asleep                 Shermeka Rutt

## 2019-12-16 NOTE — Op Note (Signed)
[facility] Patient Name: Cassidy Barber Procedure Date: 12/16/2019 7:43 AM MRN: [id number] Date of Birth: 09/17/1951 Attending MD: Hildred Laser , MD CSN: [id number] Age: 69 Admit Type: Outpatient Procedure:                Flexible Sigmoidoscopy Indications:              High risk colon cancer surveillance: Personal                            history of colon cancer Providers:                Hildred Laser, MD, Otis Peak B. Sharon Seller, RN, Raphael Gibney, Technician Referring MD:             Theador Hawthorne Hawks, FNP Medicines:                Propofol per Anesthesia Complications:            No immediate complications. Estimated Blood Loss:     Estimated blood loss: none. Procedure:                Pre-Anesthesia Assessment:                           - Prior to the procedure, a History and Physical                            was performed, and patient medications and                            allergies were reviewed. The patient's tolerance of                            previous anesthesia was also reviewed. The risks                            and benefits of the procedure and the sedation                            options and risks were discussed with the patient.                            All questions were answered, and informed consent                            was obtained. Prior Anticoagulants: The patient has                            taken no previous anticoagulant or antiplatelet                            agents. ASA Grade Assessment: III - A patient with  severe systemic disease. After reviewing the risks                            and benefits, the patient was deemed in                            satisfactory condition to undergo the procedure.                           After obtaining informed consent, the scope was                            passed under direct vision. The [id number]-[id number]                            [id number]  scope was introduced through the anus and                            advanced to the the ileo-sigmoid anastomosis. The                            flexible sigmoidoscopy was accomplished without                            difficulty. The patient tolerated the procedure                            well. The quality of the bowel preparation was                            excellent. Scope In: 7:46:03 AM Scope Out: 7:52:46 AM Total Procedure Duration: 0 hours 6 minutes 43 seconds  Findings:      The perianal and digital rectal examinations were normal.      The ileum appeared normal.      There was evidence of a prior end-to-side ileo-colonic anastomosis at 25       cm proximal to the anus. This was patent and was characterized by       healthy appearing mucosa.      The rectum and distal sigmoid colon appeared normal.      Anal papilla(e) were hypertrophied. Impression:               - The terminal ileum is normal.                           - Patent end-to-side ileo-colonic anastomosis,                            characterized by healthy appearing mucosa.                           - The rectum and distal sigmoid colon are normal.                           - Anal papilla(e) were hypertrophied.                           -  No specimens collected. Moderate Sedation:      Per Anesthesia Care Recommendation:           - Discharge patient to home (with spouse).                           - Resume previous diet today.                           - Continue present medications.                           - Repeat flexible sigmoidoscopy in 1 year for                            surveillance. Procedure Code(s):        --- Professional ---                           [phone], Sigmoidoscopy, flexible; diagnostic,                            including collection of specimen(s) by brushing or                            washing, when performed (separate procedure) Diagnosis Code(s):        --- Professional ---                            [id number], Personal history of other malignant                            neoplasm of large intestine                           Z98.0, Intestinal bypass and anastomosis status                           K62.89, Other specified diseases of anus and rectum CPT copyright 2019 American [employer]. All rights reserved. The codes documented in this report are preliminary and upon coder review may  be revised to meet current compliance requirements. Hildred Laser, MD Hildred Laser, MD 12/16/2019 8:10:00 AM This report has been signed electronically. Number of Addenda: 0

## 2019-12-16 NOTE — Discharge Instructions (Signed)
Resume usual medications. Take famotidine at bedtime for as needed at bedtime. Remember to drink plenty of water when you take Fosamax/alendronate. Resume usual diet. No driving for 24 hours. Next flexible sigmoidoscopy in 1 year.

## 2019-12-16 NOTE — Op Note (Signed)
[facility] Patient Name: Cassidy Barber Procedure Date: 12/16/2019 7:19 AM MRN: [id number] Date of Birth: 1951/04/09 Attending MD: Hildred Laser , MD CSN: [id number] Age: 69 Admit Type: Outpatient Procedure:                Upper GI endoscopy Indications:              Esophageal dysphagia, Chest pain (non cardiac) Providers:                Hildred Laser, MD, Otis Peak B. Sharon Seller, RN, Raphael Gibney, Technician Referring MD:             Theador Hawthorne Hawks, FNP Medicines:                Propofol per Anesthesia Complications:            No immediate complications. Estimated Blood Loss:     Estimated blood loss: none. Procedure:                Pre-Anesthesia Assessment:                           - Prior to the procedure, a History and Physical                            was performed, and patient medications and                            allergies were reviewed. The patient's tolerance of                            previous anesthesia was also reviewed. The risks                            and benefits of the procedure and the sedation                            options and risks were discussed with the patient.                            All questions were answered, and informed consent                            was obtained. Prior Anticoagulants: The patient has                            taken no previous anticoagulant or antiplatelet                            agents. ASA Grade Assessment: III - A patient with                            severe systemic disease. After reviewing the risks  and benefits, the patient was deemed in                            satisfactory condition to undergo the procedure.                           After obtaining informed consent, the endoscope was                            passed under direct vision. Throughout the                            procedure, the patient's blood pressure, pulse, and                    oxygen saturations were monitored continuously. The                            [id number]-H190 [id number] scope was introduced through the                            mouth, and advanced to the second part of duodenum.                            The upper GI endoscopy was accomplished without                            difficulty. The patient tolerated the procedure                            well. Scope In: 7:34:43 AM Scope Out: 7:40:01 AM Total Procedure Duration: 0 hours 5 minutes 18 seconds  Findings:      The hypopharynx was normal.      The examined esophagus was normal.      The Z-line was regular and was found 38 cm from the incisors.      Mild gastric antral vascular ectasia without bleeding was present in the       gastric fundus, in the gastric antrum and in the prepyloric region of       the stomach.      No other significant abnormalities were identified in a careful       examination of the stomach.      The duodenal bulb and second portion of the duodenum were normal. Impression:               - Normal hypopharynx.                           - Normal esophagus.                           - Z-line regular, 38 cm from the incisors.                           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                           -  Normal duodenal bulb and second portion of the                            duodenum.                           - No specimens collected.                           Comment; Healed pill esophagitis.                           Esophagus not dilated. Moderate Sedation:      Per Anesthesia Care Recommendation:           - Patient has a contact number available for                            emergencies. The signs and symptoms of potential                            delayed complications were discussed with the                            patient. Return to normal activities tomorrow.                            Written discharge instructions  were provided to the                            patient.                           - Resume previous diet today.                           - Continue present medications.                           - See the other procedure note for documentation of                            additional recommendations. Procedure Code(s):        --- Professional ---                           [phone], Esophagogastroduodenoscopy, flexible,                            transoral; diagnostic, including collection of                            specimen(s) by brushing or washing, when performed                            (separate procedure) Diagnosis Code(s):        --- Professional ---  K31.819, Angiodysplasia of stomach and duodenum                            without bleeding                           R13.14, Dysphagia, pharyngoesophageal phase                           R07.89, Other chest pain CPT copyright 2019 American Medical Association. All rights reserved. The codes documented in this report are preliminary and upon coder review may  be revised to meet current compliance requirements. Hildred Laser, MD Hildred Laser, MD 12/16/2019 8:04:26 AM This report has been signed electronically. Number of Addenda: 0

## 2019-12-18 ENCOUNTER — Inpatient Hospital Stay (HOSPITAL_COMMUNITY): Payer: Medicare Other | Attending: Hematology

## 2019-12-18 ENCOUNTER — Encounter (HOSPITAL_COMMUNITY): Payer: Self-pay

## 2019-12-18 ENCOUNTER — Other Ambulatory Visit: Payer: Self-pay

## 2019-12-18 VITALS — BP 107/63 | HR 72 | Temp 97.1°F | Resp 18

## 2019-12-18 DIAGNOSIS — D509 Iron deficiency anemia, unspecified: Secondary | ICD-10-CM | POA: Insufficient documentation

## 2019-12-18 DIAGNOSIS — D508 Other iron deficiency anemias: Secondary | ICD-10-CM

## 2019-12-18 MED ORDER — SODIUM CHLORIDE 0.9 % IV SOLN
510.0000 mg | Freq: Once | INTRAVENOUS | Status: AC
  Administered 2019-12-18: 510 mg via INTRAVENOUS
  Filled 2019-12-18: qty 510

## 2019-12-18 MED ORDER — SODIUM CHLORIDE 0.9 % IV SOLN: INTRAVENOUS | Status: DC

## 2019-12-18 NOTE — Patient Instructions (Signed)
[facility] Discharge Instructions  Received Feraheme infusion today. Follow-up as scheduled. Call clinic for any questions or concerns   Thank you for choosing [facility] at [facility] to provide your oncology and hematology care.  To afford each patient quality time with our provider, please arrive at least 15 minutes before your scheduled appointment time.   If you have a lab appointment with the Cancer Center please come in thru the Main Entrance and check in at the main information desk.  You need to re-schedule your appointment should you arrive 10 or more minutes late.  We strive to give you quality time with our providers, and arriving late affects you and other patients whose appointments are after yours.  Also, if you no show three or more times for appointments you may be dismissed from the clinic at the providers discretion.     Again, thank you for choosing [facility].  Our hope is that these requests will decrease the amount of time that you wait before being seen by our physicians.       _____________________________________________________________  Should you have questions after your visit to [facility], please contact our office at [phone] between the hours of 8:00 a.m. and 4:30 p.m.  Voicemails left after 4:00 p.m. will not be returned until the following business day.  For prescription refill requests, have your pharmacy contact our office and allow 72 hours.    Due to Covid, you will need to wear a mask upon entering the hospital. If you do not have a mask, a mask will be given to you at the Main Entrance upon arrival. For doctor visits, patients may have 1 support person with them. For treatment visits, patients can not have anyone with them due to social distancing guidelines and our immunocompromised population.     

## 2019-12-18 NOTE — Progress Notes (Signed)
Cassidy Barber tolerated Feraheme infusion well without complaints or incident. Peripheral IV site checked with positive blood return noted prior to and after infusion. VSS upon discharge. Pt discharged self ambulatory in satisfactory condition

## 2019-12-21 ENCOUNTER — Other Ambulatory Visit: Payer: Medicare Other

## 2019-12-21 ENCOUNTER — Other Ambulatory Visit: Payer: Self-pay

## 2019-12-21 DIAGNOSIS — Z944 Liver transplant status: Secondary | ICD-10-CM | POA: Diagnosis not present

## 2020-01-03 DIAGNOSIS — Z944 Liver transplant status: Secondary | ICD-10-CM | POA: Diagnosis not present

## 2020-01-04 DIAGNOSIS — Z1231 Encounter for screening mammogram for malignant neoplasm of breast: Secondary | ICD-10-CM | POA: Diagnosis not present

## 2020-01-06 ENCOUNTER — Other Ambulatory Visit: Payer: Self-pay | Admitting: Dermatology

## 2020-01-06 DIAGNOSIS — C44729 Squamous cell carcinoma of skin of left lower limb, including hip: Secondary | ICD-10-CM | POA: Diagnosis not present

## 2020-01-07 DIAGNOSIS — H2513 Age-related nuclear cataract, bilateral: Secondary | ICD-10-CM | POA: Diagnosis not present

## 2020-01-07 DIAGNOSIS — H43813 Vitreous degeneration, bilateral: Secondary | ICD-10-CM | POA: Diagnosis not present

## 2020-01-08 NOTE — H&P (Signed)
Surgical History & Physical  Patient Name: Cassidy Barber DOB: 1951/03/12  Surgery: Cataract extraction with intraocular lens implant phacoemulsification; Right Eye  Surgeon: Baruch Goldmann MD Surgery Date:  01/15/2020 Pre-Op Date:  01/07/2020  HPI: A 73 Yr. old female patient -referred by Dr Wynetta Emery for cataract eval 1. 1. The patient complains of difficulty when driving, which began 6 months ago. Both eyes are affected. The episode is constant and gradual. The patient describes foggy, ghosting and hazy symptoms affecting their eyes/vision. The condition's severity decreased since last visit and is moderate. Patient using OTC readers for help with reading but still difficult. Symptoms occur when the patient is driving and reading. Patient has significant glare with lights day and night driving. Patient can not distinguish difference between any circle images and/or numbers. OD worse c/w OS. This is negatively affecting the patient's quality of life. Patient ready to consider sx intervention for BCVA. *Lasik 2005 mono-OD NVO and OS DVO with minimal success per pt. Just never able to go completely without glasses as she hoped.  Medical History: Cataracts s/p Lasik monovision (unknown MD or location 2005) Acid reflux s/p liver transplant 11/2013 due to no... High Blood Pressure Thyroid Problems  Review of Systems Negative Allergic/Immunologic Negative Cardiovascular Negative Constitutional Negative Ear, Nose, Mouth & Throat Negative Endocrine Negative Eyes Negative Gastrointestinal Negative Genitourinary Negative Hemotologic/Lymphatic Negative Integumentary Negative Musculoskeletal Negative Neurological Negative Psychiatry Negative Respiratory  Social   Never smoked   Medication Protonix, Losartan Potassium, Urisodiol, Zortess, Synthroid,   Sx/Procedures Lasik MONOVISION OD NVO/OS DVO 2005 unknown location/MD,  Liver transplant 11/2013, Left shoulder sx 2020, Right wrist surgery  2020, Colon Resection 6 inches remaining, Removal of gallbladder and spleen due to cirrhosis,   Drug Allergies  Oxycodone, Cipro,   History & Physical: Heent:  Cataract, Right eye NECK: supple without bruits LUNGS: lungs clear to auscultation CV: regular rate and rhythm Abdomen: soft and non-tender  Impression & Plan: Assessment: 1.  NUCLEAR SCLEROSIS AGE RELATED; Both Eyes (H25.13) 2.  VITREOUS DETACHMENT PVD; Both Eyes (H43.813)  Plan: 1.  Cataract accounts for the patient's decreased vision. This visual impairment is not correctable with a tolerable change in glasses or contact lenses. Cataract surgery with an implantation of a new lens should significantly improve the visual and functional status of the patient. Discussed all risks, benefits, alternatives, and potential complications. Discussed the procedures and recovery. Patient desires to have surgery. A-scan ordered and performed today for intra-ocular lens calculations. The surgery will be performed in order to improve vision for driving, reading, and for eye examinations. Recommend phacoemulsification with intra-ocular lens. Right Eye worse - first. Monovision - goal -3.00 Dilates well - shugarcaine by protocol. S/P Laser Vision Correction - Hyperopic LASIK - Barrett True K. 2.  Old Asymptomatic. RD precautions given. Patient to call with increase in flashing lights/floaters/dark curtain. Symptomatic.

## 2020-01-11 DIAGNOSIS — H2511 Age-related nuclear cataract, right eye: Secondary | ICD-10-CM | POA: Diagnosis not present

## 2020-01-13 ENCOUNTER — Other Ambulatory Visit (HOSPITAL_COMMUNITY)
Admission: RE | Admit: 2020-01-13 | Discharge: 2020-01-13 | Disposition: A | Discharge status: Home / Self Care | Payer: Medicare Other | Source: Ambulatory Visit | Attending: Ophthalmology | Admitting: Ophthalmology

## 2020-01-13 ENCOUNTER — Encounter (HOSPITAL_COMMUNITY): Payer: Self-pay

## 2020-01-13 ENCOUNTER — Encounter (HOSPITAL_COMMUNITY)
Admission: RE | Admit: 2020-01-13 | Discharge: 2020-01-13 | Disposition: A | Discharge status: Home / Self Care | Payer: Medicare Other | Source: Ambulatory Visit | Attending: Ophthalmology | Admitting: Ophthalmology

## 2020-01-13 ENCOUNTER — Other Ambulatory Visit: Payer: Self-pay

## 2020-01-13 DIAGNOSIS — Z20822 Contact with and (suspected) exposure to covid-19: Secondary | ICD-10-CM | POA: Insufficient documentation

## 2020-01-13 DIAGNOSIS — Z01812 Encounter for preprocedural laboratory examination: Secondary | ICD-10-CM | POA: Diagnosis not present

## 2020-01-13 LAB — SARS CORONAVIRUS 2 (TAT 6-24 HRS): SARS Coronavirus 2: NEGATIVE

## 2020-01-13 NOTE — Patient Instructions (Signed)
Your procedure is scheduled on:  01/15/2020               Report to [facility] at  [place]   AM.  Call this number if you have problems the morning of surgery: [phone]   Do not eat or drink :After Midnight.    Take these medicines the morning of surgery with A SIP OF WATER:     losartan metoprolol, protonix, and pepcid        Do not wear jewelry, make-up or nail polish.  Do not wear lotions, powders, or perfumes. You may wear deodorant.  Do not bring valuables to the hospital.  Contacts, dentures or bridgework may not be worn into surgery.  Patients discharged the day of surgery will not be allowed to drive home.  Name and phone number of your driver.                                                                                                                                       Cataract Surgery  A cataract is a clouding of the lens of the eye. When a lens becomes cloudy, vision is reduced based on the degree and nature of the clouding. Surgery may be needed to improve vision. Surgery removes the cloudy lens and usually replaces it with a substitute lens (intraocular lens, IOL). LET YOUR EYE DOCTOR KNOW ABOUT:  Allergies to food or medicine.   Medicines taken including herbs, eyedrops, over-the-counter medicines, and creams.   Use of steroids (by mouth or creams).   Previous problems with anesthetics or numbing medicine.   History of bleeding problems or blood clots.   Previous surgery.   Other health problems, including diabetes and kidney problems.   Possibility of pregnancy, if this applies.  RISKS AND COMPLICATIONS  Infection.   Inflammation of the eyeball (endophthalmitis) that can spread to both eyes (sympathetic ophthalmia).   Poor wound healing.   If an IOL is inserted, it can later fall out of proper position. This is very uncommon.   Clouding of the part of your eye that holds an IOL in place. This is called an "after-cataract." These are uncommon, but  easily treated.  BEFORE THE PROCEDURE  Do not eat or drink anything except small amounts of water for 8 to 12 before your surgery, or as directed by your caregiver.    Unless you are told otherwise, continue any eyedrops you have been prescribed.   Talk to your primary caregiver about all other medicines that you take (both prescription and non-prescription). In some cases, you may need to stop or change medicines near the time of your surgery. This is most important if you are taking blood-thinning medicine. Do not stop medicines unless you are told to do so.   Arrange for someone to drive you to and from the procedure.   Do not put contact lenses in either  eye on the day of your surgery.  PROCEDURE There is more than one method for safely removing a cataract. Your doctor can explain the differences and help determine which is best for you. Phacoemulsification surgery is the most common form of cataract surgery.  An injection is given behind the eye or eyedrops are given to make this a painless procedure.   A small cut (incision) is made on the edge of the clear, dome-shaped surface that covers the front of the eye (cornea).   A tiny probe is painlessly inserted into the eye. This device gives off ultrasound waves that soften and break up the cloudy center of the lens. This makes it easier for the cloudy lens to be removed by suction.   An IOL may be implanted.   The normal lens of the eye is covered by a clear capsule. Part of that capsule is intentionally left in the eye to support the IOL.   Your surgeon may or may not use stitches to close the incision.  There are other forms of cataract surgery that require a larger incision and stiches to close the eye. This approach is taken in cases where the doctor feels that the cataract cannot be easily removed using phacoemulsification. AFTER THE PROCEDURE  When an IOL is implanted, it does not need care. It becomes a permanent part of your  eye and cannot be seen or felt.   Your doctor will schedule follow-up exams to check on your progress.   Review your other medicines with your doctor to see which can be resumed after surgery.   Use eyedrops or take medicine as prescribed by your doctor.  Document Released: 10/18/2011 Document Reviewed: 10/15/2011 [facility] Patient Information 2012 [place].  .Cataract Surgery Care After Refer to this sheet in the next few weeks. These instructions provide you with information on caring for yourself after your procedure. Your caregiver may also give you more specific instructions. Your treatment has been planned according to current medical practices, but problems sometimes occur. Call your caregiver if you have any problems or questions after your procedure.  HOME CARE INSTRUCTIONS   Avoid strenuous activities as directed by your caregiver.   Ask your caregiver when you can resume driving.   Use eyedrops or other medicines to help healing and control pressure inside your eye as directed by your caregiver.   Only take over-the-counter or prescription medicines for pain, discomfort, or fever as directed by your caregiver.   Do not to touch or rub your eyes.   You may be instructed to use a protective shield during the first few days and nights after surgery. If not, wear sunglasses to protect your eyes. This is to protect the eye from pressure or from being accidentally bumped.   Keep the area around your eye clean and dry. Avoid swimming or allowing water to hit you directly in the face while showering. Keep soap and shampoo out of your eyes.   Do not bend or lift heavy objects. Bending increases pressure in the eye. You can walk, climb stairs, and do light household chores.   Do not put a contact lens into the eye that had surgery until your caregiver says it is okay to do so.   Ask your doctor when you can return to work. This will depend on the kind of work that you do. If  you work in a dusty environment, you may be advised to wear protective eyewear for a period of time.  Ask your caregiver when it will be safe to engage in sexual activity.   Continue with your regular eye exams as directed by your caregiver.  What to expect:  It is normal to feel itching and mild discomfort for a few days after cataract surgery. Some fluid discharge is also common, and your eye may be sensitive to light and touch.   After 1 to 2 days, even moderate discomfort should disappear. In most cases, healing will take about 6 weeks.   If you received an intraocular lens (IOL), you may notice that colors are very bright or have a blue tinge. Also, if you have been in bright sunlight, everything may appear reddish for a few hours. If you see these color tinges, it is because your lens is clear and no longer cloudy. Within a few months after receiving an IOL, these extra colors should go away. When you have healed, you will probably need new glasses.  SEEK MEDICAL CARE IF:   You have increased bruising around your eye.   You have discomfort not helped by medicine.  SEEK IMMEDIATE MEDICAL CARE IF:   You have a  fever.   You have a worsening or sudden vision loss.   You have redness, swelling, or increasing pain in the eye.   You have a thick discharge from the eye that had surgery.  MAKE SURE YOU:  Understand these instructions.   Will watch your condition.   Will get help right away if you are not doing well or get worse.  Document Released: 05/18/2005 Document Revised: 10/18/2011 Document Reviewed: 06/22/2011 [facility] Patient Information 2012 [place].    Monitored Anesthesia Care  Monitored anesthesia care is an anesthesia service for a medical procedure. Anesthesia is the loss of the ability to feel pain. It is produced by medications called anesthetics. It may affect a small area of your body (local anesthesia), a large area of your body (regional anesthesia),  or your entire body (general anesthesia). The need for monitored anesthesia care depends your procedure, your condition, and the potential need for regional or general anesthesia. It is often provided during procedures where:   General anesthesia may be needed if there are complications. This is because you need special care when you are under general anesthesia.    You will be under local or regional anesthesia. This is so that you are able to have higher levels of anesthesia if needed.    You will receive calming medications (sedatives). This is especially the case if sedatives are given to put you in a semi-conscious state of relaxation (deep sedation). This is because the amount of sedative needed to produce this state can be hard to predict. Too much of a sedative can produce general anesthesia. Monitored anesthesia care is performed by one or more caregivers who have special training in all types of anesthesia. You will need to meet with these caregivers before your procedure. During this meeting, they will ask you about your medical history. They will also give you instructions to follow. (For example, you will need to stop eating and drinking before your procedure. You may also need to stop or change medications you are taking.) During your procedure, your caregivers will stay with you. They will:   Watch your condition. This includes watching you blood pressure, breathing, and level of pain.    Diagnose and treat problems that occur.    Give medications if they are needed. These may include calming medications (sedatives) and anesthetics.  Make sure you are comfortable.   Having monitored anesthesia care does not necessarily mean that you will be under anesthesia. It does mean that your caregivers will be able to manage anesthesia if you need it or if it occurs. It also means that you will be able to have a different type of anesthesia than you are having if you need it. When your  procedure is complete, your caregivers will continue to watch your condition. They will make sure any medications wear off before you are allowed to go home.  Document Released: 07/25/2005 Document Revised: 02/23/2013 Document Reviewed: 12/10/2012 [facility] Patient Information 2014 [place], [place].

## 2020-01-15 ENCOUNTER — Encounter (HOSPITAL_COMMUNITY): Payer: Self-pay | Admitting: Ophthalmology

## 2020-01-15 ENCOUNTER — Ambulatory Visit (HOSPITAL_COMMUNITY)
Admission: RE | Admit: 2020-01-15 | Discharge: 2020-01-15 | Disposition: A | Discharge status: Home / Self Care | Payer: Medicare Other | Attending: Ophthalmology | Admitting: Ophthalmology

## 2020-01-15 ENCOUNTER — Ambulatory Visit (HOSPITAL_COMMUNITY): Payer: Medicare Other | Admitting: Anesthesiology

## 2020-01-15 ENCOUNTER — Encounter (HOSPITAL_COMMUNITY)
Admission: RE | Disposition: A | Discharge status: Home / Self Care | Payer: Self-pay | Source: Home / Self Care | Attending: Ophthalmology

## 2020-01-15 ENCOUNTER — Other Ambulatory Visit: Payer: Self-pay

## 2020-01-15 DIAGNOSIS — E039 Hypothyroidism, unspecified: Secondary | ICD-10-CM | POA: Insufficient documentation

## 2020-01-15 DIAGNOSIS — Z79899 Other long term (current) drug therapy: Secondary | ICD-10-CM | POA: Diagnosis not present

## 2020-01-15 DIAGNOSIS — R569 Unspecified convulsions: Secondary | ICD-10-CM | POA: Diagnosis not present

## 2020-01-15 DIAGNOSIS — H2511 Age-related nuclear cataract, right eye: Secondary | ICD-10-CM | POA: Diagnosis not present

## 2020-01-15 DIAGNOSIS — F419 Anxiety disorder, unspecified: Secondary | ICD-10-CM | POA: Insufficient documentation

## 2020-01-15 DIAGNOSIS — E559 Vitamin D deficiency, unspecified: Secondary | ICD-10-CM | POA: Insufficient documentation

## 2020-01-15 DIAGNOSIS — Z944 Liver transplant status: Secondary | ICD-10-CM | POA: Insufficient documentation

## 2020-01-15 DIAGNOSIS — I10 Essential (primary) hypertension: Secondary | ICD-10-CM | POA: Diagnosis not present

## 2020-01-15 DIAGNOSIS — K219 Gastro-esophageal reflux disease without esophagitis: Secondary | ICD-10-CM | POA: Diagnosis not present

## 2020-01-15 HISTORY — PX: CATARACT EXTRACTION W/PHACO: SHX586

## 2020-01-15 SURGERY — PHACOEMULSIFICATION, CATARACT, WITH IOL INSERTION
Anesthesia: Monitor Anesthesia Care | Site: Eye | Laterality: Right

## 2020-01-15 MED ORDER — EPINEPHRINE PF 1 MG/ML IJ SOLN
INTRAMUSCULAR | Status: AC
  Filled 2020-01-15: qty 2

## 2020-01-15 MED ORDER — NEOMYCIN-POLYMYXIN-DEXAMETH 3.5-10000-0.1 OP SUSP
OPHTHALMIC | Status: DC | PRN
  Administered 2020-01-15: 1 [drp] via OPHTHALMIC

## 2020-01-15 MED ORDER — LIDOCAINE HCL 3.5 % OP GEL
1.0000 "application " | Freq: Once | OPHTHALMIC | Status: AC
  Administered 2020-01-15: 1 via OPHTHALMIC

## 2020-01-15 MED ORDER — TETRACAINE HCL 0.5 % OP SOLN
1.0000 [drp] | OPHTHALMIC | Status: AC | PRN
  Administered 2020-01-15 (×3): 1 [drp] via OPHTHALMIC

## 2020-01-15 MED ORDER — PROVISC 10 MG/ML IO SOLN
INTRAOCULAR | Status: DC | PRN
  Administered 2020-01-15: 0.85 mL via INTRAOCULAR

## 2020-01-15 MED ORDER — BSS IO SOLN
INTRAOCULAR | Status: DC | PRN
  Administered 2020-01-15: 15 mL via INTRAOCULAR

## 2020-01-15 MED ORDER — CYCLOPENTOLATE-PHENYLEPHRINE 0.2-1 % OP SOLN
1.0000 [drp] | OPHTHALMIC | Status: AC | PRN
  Administered 2020-01-15 (×3): 1 [drp] via OPHTHALMIC

## 2020-01-15 MED ORDER — MIDAZOLAM HCL 2 MG/2ML IJ SOLN
INTRAMUSCULAR | Status: AC
  Filled 2020-01-15: qty 2

## 2020-01-15 MED ORDER — POVIDONE-IODINE 5 % OP SOLN
OPHTHALMIC | Status: DC | PRN
  Administered 2020-01-15: 1 via OPHTHALMIC

## 2020-01-15 MED ORDER — PHENYLEPHRINE HCL 2.5 % OP SOLN
1.0000 [drp] | OPHTHALMIC | Status: AC | PRN
  Administered 2020-01-15 (×3): 1 [drp] via OPHTHALMIC

## 2020-01-15 MED ORDER — EPINEPHRINE PF 1 MG/ML IJ SOLN
INTRAOCULAR | Status: DC | PRN
  Administered 2020-01-15: 500 mL

## 2020-01-15 MED ORDER — MIDAZOLAM HCL 5 MG/5ML IJ SOLN
INTRAMUSCULAR | Status: DC | PRN
  Administered 2020-01-15 (×2): 1 mg via INTRAVENOUS

## 2020-01-15 MED ORDER — LIDOCAINE HCL (PF) 1 % IJ SOLN
INTRAOCULAR | Status: DC | PRN
  Administered 2020-01-15: 1 mL via OPHTHALMIC

## 2020-01-15 MED ORDER — SODIUM HYALURONATE 23 MG/ML IO SOLN
INTRAOCULAR | Status: DC | PRN
  Administered 2020-01-15: 0.6 mL via INTRAOCULAR

## 2020-01-15 SURGICAL SUPPLY — 12 items
CLOTH BEACON ORANGE TIMEOUT ST (SAFETY) ×2 IMPLANT
EYE SHIELD UNIVERSAL CLEAR (GAUZE/BANDAGES/DRESSINGS) ×2 IMPLANT
GLOVE BIOGEL PI IND STRL 7.0 (GLOVE) ×2 IMPLANT
GLOVE BIOGEL PI INDICATOR 7.0 (GLOVE) ×2
LENS ALC ACRYL/TECN (Ophthalmic Related) ×2 IMPLANT
NEEDLE HYPO 18GX1.5 BLUNT FILL (NEEDLE) ×2 IMPLANT
PAD ARMBOARD 7.5X6 YLW CONV (MISCELLANEOUS) ×2 IMPLANT
SYR TB 1ML LL NO SAFETY (SYRINGE) ×2 IMPLANT
TAPE SURG TRANSPORE 1 IN (GAUZE/BANDAGES/DRESSINGS) ×1 IMPLANT
TAPE SURGICAL TRANSPORE 1 IN (GAUZE/BANDAGES/DRESSINGS) ×2
VISCOELASTIC ADDITIONAL (OPHTHALMIC RELATED) ×2 IMPLANT
WATER STERILE IRR 250ML POUR (IV SOLUTION) ×2 IMPLANT

## 2020-01-15 NOTE — Interval H&P Note (Signed)
History and Physical Interval Note: The H and P was reviewed and updated. The patient was examined.  No changes were found after exam.  The surgical eye was marked.  01/15/2020 9:00 AM  Cassidy Barber  has presented today for surgery, with the diagnosis of Nuclear sclerotic cataract - Right eye.  The various methods of treatment have been discussed with the patient and family. After consideration of risks, benefits and other options for treatment, the patient has consented to  Procedure(s) with comments: CATARACT EXTRACTION PHACO AND INTRAOCULAR LENS PLACEMENT (IOC) (Right) - right - pt knows to arrive at 7:10 as a surgical intervention.  The patient's history has been reviewed, patient examined, no change in status, stable for surgery.  I have reviewed the patient's chart and labs.  Questions were answered to the patient's satisfaction.     Baruch Goldmann

## 2020-01-15 NOTE — Anesthesia Postprocedure Evaluation (Signed)
Anesthesia Post Note  Patient: Cassidy Barber  Procedure(s) Performed: CATARACT EXTRACTION PHACO AND INTRAOCULAR LENS PLACEMENT (IOC) (Right Eye)  Patient location during evaluation: PACU Anesthesia Type: MAC Level of consciousness: awake and alert and oriented Pain management: pain level controlled Vital Signs Assessment: post-procedure vital signs reviewed and stable Respiratory status: spontaneous breathing Cardiovascular status: blood pressure returned to baseline and stable Postop Assessment: no apparent nausea or vomiting Anesthetic complications: no     Last Vitals:  Vitals:   01/15/20 0820  BP: (!) 112/58  Pulse: (!) 55  Resp: 16  Temp: 36.5 C  SpO2: 95%    Last Pain:  Vitals:   01/15/20 0820  TempSrc: Oral  PainSc: 0-No pain                 Ramsey Guadamuz

## 2020-01-15 NOTE — Transfer of Care (Signed)
Immediate Anesthesia Transfer of Care Note  Patient: Cassidy PINKSTAFF  Procedure(s) Performed: CATARACT EXTRACTION PHACO AND INTRAOCULAR LENS PLACEMENT (IOC) (Right Eye)  Patient Location: Short Stay  Anesthesia Type:MAC  Level of Consciousness: awake  Airway & Oxygen Therapy: Patient Spontanous Breathing  Post-op Assessment: Report given to RN  Post vital signs: Reviewed  Last Vitals:  Vitals Value Taken Time  BP    Temp    Pulse    Resp    SpO2      Last Pain:  Vitals:   01/15/20 0820  TempSrc: Oral  PainSc: 0-No pain      Patients Stated Pain Goal: 9 (89/38/10 1751)  Complications: No apparent anesthesia complications

## 2020-01-15 NOTE — Op Note (Signed)
Date of procedure: 01/15/20  Pre-operative diagnosis: Visually significant age-related nuclear cataract, Right Eye (H25.11)  Post-operative diagnosis: Visually significant age-related nuclear cataract, Right Eye  Procedure: Removal of cataract via phacoemulsification and insertion of intra-ocular lens Wynetta Emery and [employer] DCB00  +21.0D into the capsular bag of the Right Eye  Attending surgeon: Gerda Diss. Charle Mclaurin, MD, MA  Anesthesia: MAC, Topical Akten  Complications: None  Estimated Blood Loss: <82m (minimal)  Specimens: None  Implants: As above  Indications:  Visually significant age-related cataract, Right Eye  Procedure:  The patient was seen and identified in the pre-operative area. The operative eye was identified and dilated.  The operative eye was marked.  Topical anesthesia was administered to the operative eye.     The patient was then to the operative suite and placed in the supine position.  A timeout was performed confirming the patient, procedure to be performed, and all other relevant information.   The patient's face was prepped and draped in the usual fashion for intra-ocular surgery.  A lid speculum was placed into the operative eye and the surgical microscope moved into place and focused.  A superotemporal paracentesis was created using a 20 gauge paracentesis blade.  Shugarcaine was injected into the anterior chamber.  Viscoelastic was injected into the anterior chamber.  A temporal clear-corneal main wound incision was created using a 2.430mmicrokeratome.  A continuous curvilinear capsulorrhexis was initiated using an irrigating cystitome and completed using capsulorrhexis forceps.  Hydrodissection and hydrodeliniation were performed.  Viscoelastic was injected into the anterior chamber.  A phacoemulsification handpiece and a chopper as a second instrument were used to remove the nucleus and epinucleus. The irrigation/aspiration handpiece was used to remove any  remaining cortical material.   The capsular bag was reinflated with viscoelastic, checked, and found to be intact.  The intraocular lens was inserted into the capsular bag.  The irrigation/aspiration handpiece was used to remove any remaining viscoelastic.  The clear corneal wound and paracentesis wounds were then hydrated and checked with Weck-Cels to be watertight.  The lid-speculum and drape was removed, and the patient's face was cleaned with a wet and dry 4x4.  Maxitrol was instilled in the eye before a clear shield was taped over the eye. The patient was taken to the post-operative care unit in good condition, having tolerated the procedure well.  Post-Op Instructions: [facility] and will receive all other orders and instructions.

## 2020-01-15 NOTE — Anesthesia Preprocedure Evaluation (Signed)
Anesthesia Evaluation  Patient identified by MRN, date of birth, ID band Patient awake    Reviewed: Allergy & Precautions, NPO status , Patient's Chart, lab work & pertinent test results, reviewed documented beta blocker date and time   Airway Mallampati: II  TM Distance: >3 FB Neck ROM: Full    Dental no notable dental hx. (+) Missing, Partial Upper   Pulmonary neg pulmonary ROS,    Pulmonary exam normal breath sounds clear to auscultation       Cardiovascular Exercise Tolerance: Good hypertension, Pt. on home beta blockers and Pt. on medications negative cardio ROS Normal cardiovascular examI Rhythm:Regular Rate:Normal     Neuro/Psych Seizures -, Well Controlled,  PSYCHIATRIC DISORDERS Anxiety 6 years ago right after Liver transplant - no current meds    GI/Hepatic Neg liver ROS, GERD  Medicated and Controlled,  Endo/Other  Hypothyroidism   Renal/GU negative Renal ROS  negative genitourinary   Musculoskeletal negative musculoskeletal ROS (+)   Abdominal   Peds negative pediatric ROS (+)  Hematology negative hematology ROS (+) anemia ,   Anesthesia Other Findings Lynch syndrome -s/p Colectomy  S/p Liver transplant 6 years ago  PBC (primary biliary cirrhosis) Hypothyroidism Ventral hernia, recurrent Personal history of colonic polyps Hepatic encephalopathy (HCC) Anemia of chronic disease Hx of liver transplant (HCC) Diarrhea Colon cancer (HCC) Essential hypertension, benign Seizures (HCC) Convulsions (HCC) Liver transplant recipient ([place]) GAD (generalized anxiety disorder) Vitamin D deficiency Insomnia Chronic diarrhea Iron deficiency anemia History of colon cancer History of colonic polyps Lynch syndrome Gastroesophageal reflux disease S/p reverse total shoulder arthroplasty Urticaria Benzodiazepine dependence (HCC) Controlled substance agreement signed Hx of colonic polyps Chest  pain Anterior dislocation of left shoulder S/P reverse total shoulder arthroplasty, left    Reproductive/Obstetrics negative OB ROS                            Anesthesia Physical  Anesthesia Plan  ASA: III  Anesthesia Plan: MAC   Post-op Pain Management:    Induction:   PONV Risk Score and Plan:   Airway Management Planned: Nasal Cannula and Natural Airway  Additional Equipment:   Intra-op Plan:   Post-operative Plan:   Informed Consent: I have reviewed the patients History and Physical, chart, labs and discussed the procedure including the risks, benefits and alternatives for the proposed anesthesia with the patient or authorized representative who has indicated his/her understanding and acceptance.       Plan Discussed with: CRNA and Surgeon  Anesthesia Plan Comments:        Anesthesia Quick Evaluation

## 2020-01-15 NOTE — Discharge Instructions (Addendum)
Moderate Conscious Sedation, Adult, Care After These instructions provide you with information about caring for yourself after your procedure. Your health care provider may also give you more specific instructions. Your treatment has been planned according to current medical practices, but problems sometimes occur. Call your health care provider if you have any problems or questions after your procedure. What can I expect after the procedure? After your procedure, it is common:  To feel sleepy for several hours.  To feel clumsy and have poor balance for several hours.  To have poor judgment for several hours.  To vomit if you eat too soon. Follow these instructions at home: For at least 24 hours after the procedure:   Do not: ? Participate in activities where you could fall or become injured. ? Drive. ? Use heavy machinery. ? Drink alcohol. ? Take sleeping pills or medicines that cause drowsiness. ? Make important decisions or sign legal documents. ? Take care of children on your own.  Rest. Eating and drinking  Follow the diet recommended by your health care provider.  If you vomit: ? Drink water, juice, or soup when you can drink without vomiting. ? Make sure you have little or no nausea before eating solid foods. General instructions  Have a responsible adult stay with you until you are awake and alert.  Take over-the-counter and prescription medicines only as told by your health care provider.  If you smoke, do not smoke without supervision.  Keep all follow-up visits as told by your health care provider. This is important. Contact a health care provider if:  You keep feeling nauseous or you keep vomiting.  You feel light-headed.  You develop a rash.  You have a fever. Get help right away if:  You have trouble breathing. This information is not intended to replace advice given to you by your health care provider. Make sure you discuss any questions you have  with your health care provider. Document Revised: 10/11/2017 Document Reviewed: 02/18/2016 Elsevier Patient Education  [place]. Please discharge patient when stable, will follow up today with Dr. Marisa Hua at the [facility] office immediately following discharge.  Leave shield in place until visit.  All paperwork with discharge instructions will be given at the office.  [facility] Address:  [address]  [place], [place] [address]

## 2020-01-18 ENCOUNTER — Other Ambulatory Visit: Payer: Self-pay | Admitting: Family

## 2020-01-22 NOTE — H&P (Signed)
Surgical History & Physical  Patient Name: Cassidy Barber DOB: 05/18/1951  Surgery: Cataract extraction with intraocular lens implant phacoemulsification; Left Eye  Surgeon: Baruch Goldmann MD Surgery Date:  01/29/2020 Pre-Op Date:  01/21/2020  HPI: A 47 Yr. old female patient 1. 1. The patient complains of difficulty when driving, which began 6 months ago. The left eye is affected. The episode is gradual. The condition's severity increased since last visit. Symptoms occur when the patient is driving, inside and outside. This is negatively affecting the patient's quality of life. 2. The patient is returning after cataract post-op. The right eye is affected. Status post cataract post-op, which began 1 week ago: Since the last visit, the affected area feels improvement. The patient's vision is improved. Patient is following medication instructions. Pt states her temporal vision in right eye is cloudy, like a shadow. HPI was performed by Baruch Goldmann .  Medical History: Cataracts s/p Lasik monovision (unknown MD or location 2005) Acid reflux s/p liver transplant 11/2013 due to no... High Blood Pressure Thyroid Problems  Review of Systems Negative Allergic/Immunologic Negative Cardiovascular Negative Constitutional Negative Ear, Nose, Mouth & Throat Negative Endocrine Negative Eyes Negative Gastrointestinal Negative Genitourinary Negative Hemotologic/Lymphatic Negative Integumentary Negative Musculoskeletal Negative Neurological Negative Psychiatry Negative Respiratory  Social   Never smoked   Medication Prednisolone-gatiflox-bromfenac,  Protonix, Losartan Potassium, Urisodiol, Zortess, Synthroid,   Sx/Procedures Lasik MONOVISION OD NVO/OS DVO 2005 unknown location/MD, Phaco c IOL OD,  Liver transplant 11/2013, Left shoulder sx 2020, Right wrist surgery 2020, Colon Resection 6 inches remaining, Removal of gallbladder and spleen due to cirrhosis,   Drug Allergies  Oxycodone,  Cipro,   History & Physical: Heent:  Cataract, Left eye NECK: supple without bruits LUNGS: lungs clear to auscultation CV: regular rate and rhythm Abdomen: soft and non-tender  Impression & Plan: Assessment: 1.  CATARACT EXTRACTION STATUS; Right Eye (Z98.41) 2.  NUCLEAR SCLEROSIS AGE RELATED; Left Eye (H25.12)  Plan: 1.  1 week after cataract surgery. Doing well with improved vision and normal eye pressure. Call with any problems or concerns. Continue Gati-Brom-Pred 2x/day for 3 more weeks. 2.  Cataract accounts for the patient's decreased vision. This visual impairment is not correctable with a tolerable change in glasses or contact lenses. Cataract surgery with an implantation of a new lens should significantly improve the visual and functional status of the patient. Discussed all risks, benefits, alternatives, and potential complications. Discussed the procedures and recovery. Patient desires to have surgery. A-scan ordered and performed today for intra-ocular lens calculations. The surgery will be performed in order to improve vision for driving, reading, and for eye examinations. Recommend phacoemulsification with intra-ocular lens. Dilates well - shugarcaine by protocol. S/P Laser Vision Correction - Hyperopic LASIK - [facility]. Left Eye. Surgery required to correct imbalance of vision.

## 2020-01-25 ENCOUNTER — Other Ambulatory Visit: Payer: Self-pay

## 2020-01-25 ENCOUNTER — Encounter (HOSPITAL_COMMUNITY)
Admission: RE | Admit: 2020-01-25 | Discharge: 2020-01-25 | Disposition: A | Discharge status: Home / Self Care | Payer: Medicare Other | Source: Ambulatory Visit | Attending: Ophthalmology | Admitting: Ophthalmology

## 2020-01-25 DIAGNOSIS — H2512 Age-related nuclear cataract, left eye: Secondary | ICD-10-CM | POA: Diagnosis not present

## 2020-01-26 ENCOUNTER — Telehealth: Payer: Self-pay | Admitting: Physician Assistant

## 2020-01-26 DIAGNOSIS — C44329 Squamous cell carcinoma of skin of other parts of face: Secondary | ICD-10-CM | POA: Diagnosis not present

## 2020-01-26 DIAGNOSIS — L989 Disorder of the skin and subcutaneous tissue, unspecified: Secondary | ICD-10-CM | POA: Diagnosis not present

## 2020-01-26 NOTE — Telephone Encounter (Signed)
Patient is calling about the spot on chest we removed and wants to know if she needs mohs surgery on that area or what care is needed. Chart [phone]

## 2020-01-27 ENCOUNTER — Telehealth: Payer: Self-pay | Admitting: *Deleted

## 2020-01-27 ENCOUNTER — Telehealth: Payer: Self-pay

## 2020-01-27 ENCOUNTER — Other Ambulatory Visit (HOSPITAL_COMMUNITY)
Admission: RE | Admit: 2020-01-27 | Discharge: 2020-01-27 | Disposition: A | Discharge status: Home / Self Care | Payer: Medicare Other | Source: Ambulatory Visit | Attending: Ophthalmology | Admitting: Ophthalmology

## 2020-01-27 ENCOUNTER — Other Ambulatory Visit: Payer: Self-pay

## 2020-01-27 DIAGNOSIS — Z20822 Contact with and (suspected) exposure to covid-19: Secondary | ICD-10-CM | POA: Diagnosis not present

## 2020-01-27 DIAGNOSIS — Z01812 Encounter for preprocedural laboratory examination: Secondary | ICD-10-CM | POA: Diagnosis not present

## 2020-01-27 LAB — SARS CORONAVIRUS 2 (TAT 6-24 HRS): SARS Coronavirus 2: NEGATIVE

## 2020-01-27 NOTE — Telephone Encounter (Signed)
Phone call from patient, she had a question about her previous biopsy on the right chest. [facility] had done surgery on her forehead and asked about the chest. Patient was wondering if she needed mohs on that. Told her that [facility] treated it when she was here and as long as it wasn't coming back she was ok. Patient has a appointment with [place] next month. She will call with any problems.

## 2020-01-27 NOTE — Telephone Encounter (Signed)
Left a voicemail for the patient to call the office because trying to see if the place on her right chest is already coming back.

## 2020-01-28 ENCOUNTER — Telehealth: Payer: Self-pay

## 2020-01-28 NOTE — Telephone Encounter (Signed)
Advised patient that Vida Roller would like her to schedule her an appointment for a possible excision for the place on her chest.  Patient stated that she has an appointment with Vida Roller on April 13th.

## 2020-01-28 NOTE — Telephone Encounter (Signed)
Please schedule a 30 min appointment to evaluate and excise prn.

## 2020-01-29 ENCOUNTER — Ambulatory Visit (HOSPITAL_COMMUNITY)
Admission: RE | Admit: 2020-01-29 | Discharge: 2020-01-29 | Disposition: A | Discharge status: Home / Self Care | Payer: Medicare Other | Attending: Ophthalmology | Admitting: Ophthalmology

## 2020-01-29 ENCOUNTER — Encounter (HOSPITAL_COMMUNITY)
Admission: RE | Disposition: A | Discharge status: Home / Self Care | Payer: Self-pay | Source: Home / Self Care | Attending: Ophthalmology

## 2020-01-29 ENCOUNTER — Ambulatory Visit (HOSPITAL_COMMUNITY): Payer: Medicare Other | Admitting: Anesthesiology

## 2020-01-29 DIAGNOSIS — E039 Hypothyroidism, unspecified: Secondary | ICD-10-CM | POA: Diagnosis not present

## 2020-01-29 DIAGNOSIS — Z79890 Hormone replacement therapy (postmenopausal): Secondary | ICD-10-CM | POA: Diagnosis not present

## 2020-01-29 DIAGNOSIS — Z9049 Acquired absence of other specified parts of digestive tract: Secondary | ICD-10-CM | POA: Diagnosis not present

## 2020-01-29 DIAGNOSIS — I10 Essential (primary) hypertension: Secondary | ICD-10-CM | POA: Insufficient documentation

## 2020-01-29 DIAGNOSIS — Z85038 Personal history of other malignant neoplasm of large intestine: Secondary | ICD-10-CM | POA: Insufficient documentation

## 2020-01-29 DIAGNOSIS — K219 Gastro-esophageal reflux disease without esophagitis: Secondary | ICD-10-CM | POA: Diagnosis not present

## 2020-01-29 DIAGNOSIS — Z944 Liver transplant status: Secondary | ICD-10-CM | POA: Diagnosis not present

## 2020-01-29 DIAGNOSIS — Z79899 Other long term (current) drug therapy: Secondary | ICD-10-CM | POA: Diagnosis not present

## 2020-01-29 DIAGNOSIS — H2512 Age-related nuclear cataract, left eye: Secondary | ICD-10-CM | POA: Insufficient documentation

## 2020-01-29 DIAGNOSIS — Z9841 Cataract extraction status, right eye: Secondary | ICD-10-CM | POA: Insufficient documentation

## 2020-01-29 HISTORY — PX: CATARACT EXTRACTION W/PHACO: SHX586

## 2020-01-29 SURGERY — PHACOEMULSIFICATION, CATARACT, WITH IOL INSERTION
Anesthesia: Monitor Anesthesia Care | Site: Eye | Laterality: Left

## 2020-01-29 MED ORDER — PROVISC 10 MG/ML IO SOLN
INTRAOCULAR | Status: DC | PRN
  Administered 2020-01-29: 0.85 mL via INTRAOCULAR

## 2020-01-29 MED ORDER — NEOMYCIN-POLYMYXIN-DEXAMETH 3.5-10000-0.1 OP SUSP
OPHTHALMIC | Status: DC | PRN
  Administered 2020-01-29: 1 [drp] via OPHTHALMIC

## 2020-01-29 MED ORDER — POVIDONE-IODINE 5 % OP SOLN
OPHTHALMIC | Status: DC | PRN
  Administered 2020-01-29: 1 via OPHTHALMIC

## 2020-01-29 MED ORDER — EPINEPHRINE PF 1 MG/ML IJ SOLN
INTRAOCULAR | Status: DC | PRN
  Administered 2020-01-29: 500 mL

## 2020-01-29 MED ORDER — EPINEPHRINE PF 1 MG/ML IJ SOLN
INTRAMUSCULAR | Status: AC
  Filled 2020-01-29: qty 2

## 2020-01-29 MED ORDER — MIDAZOLAM HCL 2 MG/2ML IJ SOLN
INTRAMUSCULAR | Status: AC
  Filled 2020-01-29: qty 2

## 2020-01-29 MED ORDER — LIDOCAINE HCL 3.5 % OP GEL
1.0000 "application " | Freq: Once | OPHTHALMIC | Status: AC
  Administered 2020-01-29: 1 via OPHTHALMIC

## 2020-01-29 MED ORDER — BSS IO SOLN
INTRAOCULAR | Status: DC | PRN
  Administered 2020-01-29: 15 mL via INTRAOCULAR

## 2020-01-29 MED ORDER — PHENYLEPHRINE HCL 2.5 % OP SOLN
1.0000 [drp] | OPHTHALMIC | Status: AC | PRN
  Administered 2020-01-29 (×3): 1 [drp] via OPHTHALMIC

## 2020-01-29 MED ORDER — TETRACAINE HCL 0.5 % OP SOLN
1.0000 [drp] | OPHTHALMIC | Status: AC | PRN
  Administered 2020-01-29 (×3): 1 [drp] via OPHTHALMIC

## 2020-01-29 MED ORDER — MIDAZOLAM HCL 2 MG/2ML IJ SOLN
INTRAMUSCULAR | Status: DC | PRN
  Administered 2020-01-29: 2 mg via INTRAVENOUS

## 2020-01-29 MED ORDER — CYCLOPENTOLATE-PHENYLEPHRINE 0.2-1 % OP SOLN
1.0000 [drp] | OPHTHALMIC | Status: AC | PRN
  Administered 2020-01-29 (×3): 1 [drp] via OPHTHALMIC

## 2020-01-29 MED ORDER — SODIUM HYALURONATE 23 MG/ML IO SOLN
INTRAOCULAR | Status: DC | PRN
  Administered 2020-01-29: 0.6 mL via INTRAOCULAR

## 2020-01-29 MED ORDER — LIDOCAINE HCL (PF) 1 % IJ SOLN
INTRAOCULAR | Status: DC | PRN
  Administered 2020-01-29: 1 mL via OPHTHALMIC

## 2020-01-29 SURGICAL SUPPLY — 17 items
CLOTH BEACON ORANGE TIMEOUT ST (SAFETY) ×1 IMPLANT
DEVICE MILOOP (MISCELLANEOUS) IMPLANT
EYE SHIELD UNIVERSAL CLEAR (GAUZE/BANDAGES/DRESSINGS) ×1 IMPLANT
GLOVE BIOGEL PI IND STRL 7.0 (GLOVE) IMPLANT
GLOVE BIOGEL PI INDICATOR 7.0 (GLOVE) ×2
LENS ALC ACRYL/TECN (Ophthalmic Related) ×1 IMPLANT
MILOOP DEVICE (MISCELLANEOUS)
NDL HYPO 18GX1.5 BLUNT FILL (NEEDLE) IMPLANT
NEEDLE HYPO 18GX1.5 BLUNT FILL (NEEDLE) ×2 IMPLANT
PAD ARMBOARD 7.5X6 YLW CONV (MISCELLANEOUS) ×1 IMPLANT
RING MALYGIN (MISCELLANEOUS) IMPLANT
RING MALYGIN 7.0 (MISCELLANEOUS) IMPLANT
SYR TB 1ML LL NO SAFETY (SYRINGE) ×1 IMPLANT
TAPE SURG TRANSPORE 1 IN (GAUZE/BANDAGES/DRESSINGS) IMPLANT
TAPE SURGICAL TRANSPORE 1 IN (GAUZE/BANDAGES/DRESSINGS) ×2
VISCOELASTIC ADDITIONAL (OPHTHALMIC RELATED) ×1 IMPLANT
WATER STERILE IRR 250ML POUR (IV SOLUTION) ×1 IMPLANT

## 2020-01-29 NOTE — Op Note (Signed)
Date of procedure: 01/29/20  Pre-operative diagnosis: Visually significant age-related nuclear cataract, Left Eye (H25.12)  Post-operative diagnosis: Visually significant age-related nuclear cataract, Left Eye  Procedure: Removal of cataract via phacoemulsification and insertion of intra-ocular lens [place]  +23.0D into the capsular bag of the Left Eye  Attending surgeon: Gerda Diss. Kyian Obst, MD, MA  Anesthesia: MAC, Topical [place]  Complications: None  Estimated Blood Loss: <58m (minimal)  Specimens: None  Implants: As above  Indications:  Visually significant age-related cataract, Left Eye  Procedure:  The patient was seen and identified in the pre-operative area. The operative eye was identified and dilated.  The operative eye was marked.  Topical anesthesia was administered to the operative eye.     The patient was then to the operative suite and placed in the supine position.  A timeout was performed confirming the patient, procedure to be performed, and all other relevant information.   The patient's face was prepped and draped in the usual fashion for intra-ocular surgery.  A lid speculum was placed into the operative eye and the surgical microscope moved into place and focused.  An inferotemporal paracentesis was created using a 20 gauge paracentesis blade.  Shugarcaine was injected into the anterior chamber.  Viscoelastic was injected into the anterior chamber.  A temporal clear-corneal main wound incision was created using a 2.427mmicrokeratome.  A continuous curvilinear capsulorrhexis was initiated using an irrigating cystitome and completed using capsulorrhexis forceps.  Hydrodissection and hydrodeliniation were performed.  Viscoelastic was injected into the anterior chamber.  A phacoemulsification handpiece and a chopper as a second instrument were used to remove the nucleus and epinucleus. The irrigation/aspiration handpiece was used to remove any remaining  cortical material.   The capsular bag was reinflated with viscoelastic, checked, and found to be intact.  The intraocular lens was inserted into the capsular bag.  The irrigation/aspiration handpiece was used to remove any remaining viscoelastic.  The clear corneal wound and paracentesis wounds were then hydrated and checked with Weck-Cels to be watertight.  The lid-speculum and drape was removed, and the patient's face was cleaned with a wet and dry 4x4.  Maxitrol was instilled in the eye before a clear shield was taped over the eye. The patient was taken to the post-operative care unit in good condition, having tolerated the procedure well.  Post-Op Instructions: [facility] and will receive all other orders and instructions.

## 2020-01-29 NOTE — Anesthesia Postprocedure Evaluation (Signed)
Anesthesia Post Note  Patient: Cassidy Barber  Procedure(s) Performed: CATARACT EXTRACTION PHACO AND INTRAOCULAR LENS PLACEMENT (IOC) (CDE: 6.33) (Left Eye)  Patient location during evaluation: PACU Anesthesia Type: MAC Level of consciousness: awake, oriented, awake and alert and patient cooperative Pain management: pain level controlled Vital Signs Assessment: post-procedure vital signs reviewed and stable Respiratory status: spontaneous breathing, respiratory function stable and nonlabored ventilation Cardiovascular status: stable Postop Assessment: no apparent nausea or vomiting Anesthetic complications: no     Last Vitals:  Vitals:   01/29/20 1000 01/29/20 1015  BP: 127/71   Pulse: 68   Resp: 18 20  Temp: 36.8 C   SpO2: 95% 96%    Last Pain:  Vitals:   01/29/20 1000  TempSrc: Oral                 Olis Viverette

## 2020-01-29 NOTE — Transfer of Care (Signed)
Immediate Anesthesia Transfer of Care Note  Patient: Cassidy Barber  Procedure(s) Performed: CATARACT EXTRACTION PHACO AND INTRAOCULAR LENS PLACEMENT (IOC) (CDE: 6.33) (Left Eye)  Patient Location: PACU  Anesthesia Type:MAC  Level of Consciousness: awake, alert , oriented and patient cooperative  Airway & Oxygen Therapy: Patient Spontanous Breathing  Post-op Assessment: Report given to RN and Post -op Vital signs reviewed and stable  Post vital signs: Reviewed and stable  Last Vitals:  Vitals Value Taken Time  BP    Temp    Pulse    Resp    SpO2      Last Pain:  Vitals:   01/29/20 1000  TempSrc: Oral         Complications: No apparent anesthesia complications

## 2020-01-29 NOTE — Interval H&P Note (Signed)
History and Physical Interval Note: The H and P was reviewed and updated. The patient was examined.  No changes were found after exam.  The surgical eye was marked.  01/29/2020 10:22 AM  Cassidy Barber  has presented today for surgery, with the diagnosis of Nuclear sclerotic cataract - Left eye.  The various methods of treatment have been discussed with the patient and family. After consideration of risks, benefits and other options for treatment, the patient has consented to  Procedure(s) with comments: CATARACT EXTRACTION PHACO AND INTRAOCULAR LENS PLACEMENT ([place]) (Left) - left as a surgical intervention.  The patient's history has been reviewed, patient examined, no change in status, stable for surgery.  I have reviewed the patient's chart and labs.  Questions were answered to the patient's satisfaction.     Baruch Goldmann

## 2020-01-29 NOTE — Discharge Instructions (Addendum)
Please discharge patient when stable, will follow up today with Dr. Wrzosek at the [facility] office immediately following discharge.  Leave shield in place until visit.  All paperwork with discharge instructions will be given at the office.  [facility] Address:  [address]  [place], [place] [address]   PATIENT INSTRUCTIONS POST-ANESTHESIA  IMMEDIATELY FOLLOWING SURGERY:  Do not drive or operate machinery for the first twenty four hours after surgery.  Do not make any important decisions for twenty four hours after surgery or while taking narcotic pain medications or sedatives.  If you develop intractable nausea and vomiting or a severe headache please notify your doctor immediately.  FOLLOW-UP:  Please make an appointment with your surgeon as instructed. You do not need to follow up with anesthesia unless specifically instructed to do so.  WOUND CARE INSTRUCTIONS (if applicable):  Keep a dry clean dressing on the anesthesia/puncture wound site if there is drainage.  Once the wound has quit draining you may leave it open to air.  Generally you should leave the bandage intact for twenty four hours unless there is drainage.  If the epidural site drains for more than 36-48 hours please call the anesthesia department.  QUESTIONS?:  Please feel free to call your physician or the hospital operator if you have any questions, and they will be happy to assist you.       

## 2020-01-29 NOTE — Anesthesia Preprocedure Evaluation (Signed)
Anesthesia Evaluation  Patient identified by MRN, date of birth, ID band Patient awake    Reviewed: Allergy & Precautions, NPO status , Patient's Chart, lab work & pertinent test results, reviewed documented beta blocker date and time   Airway Mallampati: II  TM Distance: >3 FB Neck ROM: Full    Dental no notable dental hx. (+) Missing, Partial Upper   Pulmonary neg pulmonary ROS,    Pulmonary exam normal breath sounds clear to auscultation       Cardiovascular Exercise Tolerance: Good hypertension, Pt. on home beta blockers and Pt. on medications negative cardio ROS Normal cardiovascular examI Rhythm:Regular Rate:Normal     Neuro/Psych Seizures -, Well Controlled,  PSYCHIATRIC DISORDERS Anxiety 6 years ago right after Liver transplant - no current meds    GI/Hepatic Neg liver ROS, GERD  Medicated and Controlled,  Endo/Other  Hypothyroidism   Renal/GU negative Renal ROS  negative genitourinary   Musculoskeletal negative musculoskeletal ROS (+)   Abdominal   Peds negative pediatric ROS (+)  Hematology negative hematology ROS (+) anemia ,   Anesthesia Other Findings Lynch syndrome -s/p Colectomy  S/p Liver transplant 6 years ago  PBC (primary biliary cirrhosis) Hypothyroidism Ventral hernia, recurrent Personal history of colonic polyps Hepatic encephalopathy (HCC) Anemia of chronic disease Hx of liver transplant (HCC) Diarrhea Colon cancer (HCC) Essential hypertension, benign Seizures (HCC) Convulsions (HCC) Liver transplant recipient ([place]) GAD (generalized anxiety disorder) Vitamin D deficiency Insomnia Chronic diarrhea Iron deficiency anemia History of colon cancer History of colonic polyps Lynch syndrome Gastroesophageal reflux disease S/p reverse total shoulder arthroplasty Urticaria Benzodiazepine dependence (HCC) Controlled substance agreement signed Hx of colonic polyps Chest  pain Anterior dislocation of left shoulder S/P reverse total shoulder arthroplasty, left    Reproductive/Obstetrics negative OB ROS                             Anesthesia Physical  Anesthesia Plan  ASA: III  Anesthesia Plan: MAC   Post-op Pain Management:    Induction:   PONV Risk Score and Plan: 1 and Treatment may vary due to age or medical condition  Airway Management Planned: Nasal Cannula and Natural Airway  Additional Equipment:   Intra-op Plan:   Post-operative Plan:   Informed Consent: I have reviewed the patients History and Physical, chart, labs and discussed the procedure including the risks, benefits and alternatives for the proposed anesthesia with the patient or authorized representative who has indicated his/her understanding and acceptance.       Plan Discussed with: CRNA and Surgeon  Anesthesia Plan Comments:         Anesthesia Quick Evaluation

## 2020-01-31 DIAGNOSIS — Z944 Liver transplant status: Secondary | ICD-10-CM | POA: Diagnosis not present

## 2020-02-15 ENCOUNTER — Other Ambulatory Visit: Payer: Medicare Other

## 2020-02-15 ENCOUNTER — Other Ambulatory Visit: Payer: Self-pay

## 2020-02-15 DIAGNOSIS — Z944 Liver transplant status: Secondary | ICD-10-CM | POA: Diagnosis not present

## 2020-02-15 DIAGNOSIS — Z298 Encounter for other specified prophylactic measures: Secondary | ICD-10-CM | POA: Diagnosis not present

## 2020-02-15 DIAGNOSIS — D849 Immunodeficiency, unspecified: Secondary | ICD-10-CM | POA: Diagnosis not present

## 2020-02-16 DIAGNOSIS — H18893 Other specified disorders of cornea, bilateral: Secondary | ICD-10-CM | POA: Diagnosis not present

## 2020-02-16 DIAGNOSIS — Z79891 Long term (current) use of opiate analgesic: Secondary | ICD-10-CM | POA: Diagnosis not present

## 2020-02-16 DIAGNOSIS — Z79899 Other long term (current) drug therapy: Secondary | ICD-10-CM | POA: Diagnosis not present

## 2020-02-16 DIAGNOSIS — Z7982 Long term (current) use of aspirin: Secondary | ICD-10-CM | POA: Diagnosis not present

## 2020-02-16 DIAGNOSIS — Z944 Liver transplant status: Secondary | ICD-10-CM | POA: Diagnosis not present

## 2020-02-16 DIAGNOSIS — C792 Secondary malignant neoplasm of skin: Secondary | ICD-10-CM | POA: Diagnosis not present

## 2020-02-16 DIAGNOSIS — C44329 Squamous cell carcinoma of skin of other parts of face: Secondary | ICD-10-CM | POA: Diagnosis not present

## 2020-02-16 DIAGNOSIS — K219 Gastro-esophageal reflux disease without esophagitis: Secondary | ICD-10-CM | POA: Diagnosis not present

## 2020-02-16 DIAGNOSIS — Z9049 Acquired absence of other specified parts of digestive tract: Secondary | ICD-10-CM | POA: Diagnosis not present

## 2020-02-16 DIAGNOSIS — Z886 Allergy status to analgesic agent status: Secondary | ICD-10-CM | POA: Diagnosis not present

## 2020-02-16 DIAGNOSIS — Z90710 Acquired absence of both cervix and uterus: Secondary | ICD-10-CM | POA: Diagnosis not present

## 2020-02-22 DIAGNOSIS — C44329 Squamous cell carcinoma of skin of other parts of face: Secondary | ICD-10-CM | POA: Diagnosis not present

## 2020-02-22 DIAGNOSIS — Z90710 Acquired absence of both cervix and uterus: Secondary | ICD-10-CM | POA: Diagnosis not present

## 2020-02-22 DIAGNOSIS — Z886 Allergy status to analgesic agent status: Secondary | ICD-10-CM | POA: Diagnosis not present

## 2020-02-22 DIAGNOSIS — Z9049 Acquired absence of other specified parts of digestive tract: Secondary | ICD-10-CM | POA: Diagnosis not present

## 2020-02-22 DIAGNOSIS — K219 Gastro-esophageal reflux disease without esophagitis: Secondary | ICD-10-CM | POA: Diagnosis not present

## 2020-02-22 DIAGNOSIS — C792 Secondary malignant neoplasm of skin: Secondary | ICD-10-CM | POA: Diagnosis not present

## 2020-02-22 DIAGNOSIS — Z79891 Long term (current) use of opiate analgesic: Secondary | ICD-10-CM | POA: Diagnosis not present

## 2020-02-22 DIAGNOSIS — Z7982 Long term (current) use of aspirin: Secondary | ICD-10-CM | POA: Diagnosis not present

## 2020-02-22 DIAGNOSIS — Z944 Liver transplant status: Secondary | ICD-10-CM | POA: Diagnosis not present

## 2020-02-22 DIAGNOSIS — Z79899 Other long term (current) drug therapy: Secondary | ICD-10-CM | POA: Diagnosis not present

## 2020-02-23 ENCOUNTER — Ambulatory Visit (INDEPENDENT_AMBULATORY_CARE_PROVIDER_SITE_OTHER): Payer: Medicare Other | Admitting: Physician Assistant

## 2020-02-23 ENCOUNTER — Encounter: Payer: Self-pay | Admitting: Physician Assistant

## 2020-02-23 ENCOUNTER — Other Ambulatory Visit: Payer: Self-pay

## 2020-02-23 DIAGNOSIS — C4492 Squamous cell carcinoma of skin, unspecified: Secondary | ICD-10-CM

## 2020-02-23 DIAGNOSIS — L570 Actinic keratosis: Secondary | ICD-10-CM | POA: Diagnosis not present

## 2020-02-23 DIAGNOSIS — D485 Neoplasm of uncertain behavior of skin: Secondary | ICD-10-CM

## 2020-02-23 DIAGNOSIS — C44529 Squamous cell carcinoma of skin of other part of trunk: Secondary | ICD-10-CM | POA: Diagnosis not present

## 2020-02-23 DIAGNOSIS — D489 Neoplasm of uncertain behavior, unspecified: Secondary | ICD-10-CM

## 2020-02-23 HISTORY — DX: Squamous cell carcinoma of skin, unspecified: C44.92

## 2020-02-23 NOTE — Progress Notes (Addendum)
Follow-Up Visit   Subjective  Cassidy Barber is a 69 y.o. female who presents for the following: Skin Problem (spots on chest and legs). Liver transplant with prograf tx/ she is not on this now.   She is scheduled to get radiation on her right forehead for perineural involvement resulting from a moderately differentiated SCC.    The following portions of the chart were reviewed this encounter and updated as appropriate: Tobacco  Allergies  Meds  Problems  Med Hx  Surg Hx  Fam Hx      Objective  Well appearing patient in no apparent distress; mood and affect are within normal limits.  A focused examination was performed including chest, axillae, abdomen, back, and buttocks and face, ears, neck, chest, legs, arms. Relevant physical exam findings are noted in the Assessment and Plan.  Objective  Left Malar Cheek: Erythematous patches with gritty scale.  Objective  Right upper chest: Hyperkeratotic scale with pink base      Objective  Right Ala Nasi: Thick crust     Objective  Left bowl of ear: Volcano growth on pink base      Assessment & Plan  AK (actinic keratosis) Left Malar Cheek  Destruction of lesion - Left Malar Cheek Complexity: simple   Destruction method: cryotherapy   Informed consent: discussed and consent obtained   Timeout:  patient name, date of birth, surgical site, and procedure verified Lesion destroyed using liquid nitrogen: Yes   Region frozen until ice ball extended beyond lesion: Yes   Cryotherapy cycles:  1 Outcome: patient tolerated procedure well with no complications   Post-procedure details: wound care instructions given    Squamous cell carcinoma of skin Right upper chest  Destruction of lesion Complexity: simple   Destruction method: electrodesiccation and curettage   Informed consent: discussed and consent obtained   Timeout:  patient name, date of birth, surgical site, and procedure verified Anesthesia: the lesion was  anesthetized in a standard fashion   Anesthetic:  1% lidocaine w/ epinephrine 1-100,000 local infiltration Curettage performed in three different directions: Yes   Electrodesiccation performed over the curetted area: Yes   Curettage cycles:  3 Lesion length (cm):  1.3 Lesion width (cm):  1.3 Margin per side (cm):  0.1 Final wound size (cm):  1.5 Hemostasis achieved with:  aluminum chloride Outcome: patient tolerated procedure well with no complications   Post-procedure details: wound care instructions given    Skin / nail biopsy Type of biopsy: tangential   Informed consent: discussed and consent obtained   Timeout: patient name, date of birth, surgical site, and procedure verified   Anesthesia: the lesion was anesthetized in a standard fashion   Anesthetic:  1% lidocaine w/ epinephrine 1-100,000 local infiltration Instrument used: flexible razor blade   Hemostasis achieved with: aluminum chloride and electrodesiccation   Outcome: patient tolerated procedure well   Post-procedure details: wound care instructions given    Specimen 4 - Surgical pathology Differential Diagnosis: scc  Check Margins: No MOHs if positive  TX p BX  Refer to mohs  Neoplasm of uncertain behavior (2) Right Ala Nasi  Skin / nail biopsy Type of biopsy: tangential   Informed consent: discussed and consent obtained   Timeout: patient name, date of birth, surgical site, and procedure verified   Anesthesia: the lesion was anesthetized in a standard fashion   Instrument used: flexible razor blade   Hemostasis achieved with: pressure   Outcome: patient tolerated procedure well   Post-procedure details:  wound care instructions given   Additional details:  TX p BX 0.5cm  Will refer to [facility] - Dr. Velia Meyer in [place]  Specimen 2 - Surgical pathology Differential Diagnosis: scc Check Margins: No  TX p BX  Left bowl of ear  Skin / nail biopsy Type of biopsy: tangential   Informed consent:  discussed and consent obtained   Timeout: patient name, date of birth, surgical site, and procedure verified   Anesthesia: the lesion was anesthetized in a standard fashion   Anesthetic:  1% lidocaine w/ epinephrine 1-100,000 local infiltration Instrument used: flexible razor blade   Hemostasis achieved with: aluminum chloride and electrodesiccation   Outcome: patient tolerated procedure well   Post-procedure details: wound care instructions given    Specimen 3 - Surgical pathology Differential Diagnosis: scc Check Margins: No  TX p BX

## 2020-02-23 NOTE — Patient Instructions (Signed)

## 2020-02-24 ENCOUNTER — Encounter: Payer: Self-pay | Admitting: *Deleted

## 2020-02-24 DIAGNOSIS — Z7982 Long term (current) use of aspirin: Secondary | ICD-10-CM | POA: Diagnosis not present

## 2020-02-24 DIAGNOSIS — Z9049 Acquired absence of other specified parts of digestive tract: Secondary | ICD-10-CM | POA: Diagnosis not present

## 2020-02-24 DIAGNOSIS — C44329 Squamous cell carcinoma of skin of other parts of face: Secondary | ICD-10-CM | POA: Diagnosis not present

## 2020-02-24 DIAGNOSIS — K219 Gastro-esophageal reflux disease without esophagitis: Secondary | ICD-10-CM | POA: Diagnosis not present

## 2020-02-24 DIAGNOSIS — Z79891 Long term (current) use of opiate analgesic: Secondary | ICD-10-CM | POA: Diagnosis not present

## 2020-02-24 DIAGNOSIS — Z944 Liver transplant status: Secondary | ICD-10-CM | POA: Diagnosis not present

## 2020-02-24 DIAGNOSIS — Z510 Encounter for antineoplastic radiation therapy: Secondary | ICD-10-CM | POA: Diagnosis not present

## 2020-02-24 DIAGNOSIS — Z90710 Acquired absence of both cervix and uterus: Secondary | ICD-10-CM | POA: Diagnosis not present

## 2020-02-24 DIAGNOSIS — Z886 Allergy status to analgesic agent status: Secondary | ICD-10-CM | POA: Diagnosis not present

## 2020-02-24 DIAGNOSIS — Z79899 Other long term (current) drug therapy: Secondary | ICD-10-CM | POA: Diagnosis not present

## 2020-02-24 DIAGNOSIS — C792 Secondary malignant neoplasm of skin: Secondary | ICD-10-CM | POA: Diagnosis not present

## 2020-02-25 ENCOUNTER — Telehealth: Payer: Self-pay

## 2020-02-25 NOTE — Telephone Encounter (Signed)
Phone call to patient with Pathology results and [facility] recommendations.  Patient aware.

## 2020-02-25 NOTE — Telephone Encounter (Signed)
-----   Message from [name], [place] sent at 02/25/2020  1:01 PM EDT ----- #3 Cassidy Barber

## 2020-02-26 DIAGNOSIS — Z90710 Acquired absence of both cervix and uterus: Secondary | ICD-10-CM | POA: Diagnosis not present

## 2020-02-26 DIAGNOSIS — K219 Gastro-esophageal reflux disease without esophagitis: Secondary | ICD-10-CM | POA: Diagnosis not present

## 2020-02-26 DIAGNOSIS — Z79891 Long term (current) use of opiate analgesic: Secondary | ICD-10-CM | POA: Diagnosis not present

## 2020-02-26 DIAGNOSIS — Z9049 Acquired absence of other specified parts of digestive tract: Secondary | ICD-10-CM | POA: Diagnosis not present

## 2020-02-26 DIAGNOSIS — C792 Secondary malignant neoplasm of skin: Secondary | ICD-10-CM | POA: Diagnosis not present

## 2020-02-26 DIAGNOSIS — C44329 Squamous cell carcinoma of skin of other parts of face: Secondary | ICD-10-CM | POA: Diagnosis not present

## 2020-02-26 DIAGNOSIS — H5203 Hypermetropia, bilateral: Secondary | ICD-10-CM | POA: Diagnosis not present

## 2020-02-26 DIAGNOSIS — Z7982 Long term (current) use of aspirin: Secondary | ICD-10-CM | POA: Diagnosis not present

## 2020-02-26 DIAGNOSIS — Z79899 Other long term (current) drug therapy: Secondary | ICD-10-CM | POA: Diagnosis not present

## 2020-02-26 DIAGNOSIS — Z886 Allergy status to analgesic agent status: Secondary | ICD-10-CM | POA: Diagnosis not present

## 2020-02-26 DIAGNOSIS — Z944 Liver transplant status: Secondary | ICD-10-CM | POA: Diagnosis not present

## 2020-02-28 DIAGNOSIS — Z944 Liver transplant status: Secondary | ICD-10-CM | POA: Diagnosis not present

## 2020-03-01 ENCOUNTER — Other Ambulatory Visit (HOSPITAL_COMMUNITY): Payer: Medicare Other

## 2020-03-01 DIAGNOSIS — Z510 Encounter for antineoplastic radiation therapy: Secondary | ICD-10-CM | POA: Diagnosis not present

## 2020-03-01 DIAGNOSIS — C44329 Squamous cell carcinoma of skin of other parts of face: Secondary | ICD-10-CM | POA: Diagnosis not present

## 2020-03-02 ENCOUNTER — Encounter: Payer: Self-pay | Admitting: *Deleted

## 2020-03-02 NOTE — Telephone Encounter (Signed)
-----   Message from [name], [place] sent at 02/25/2020  1:01 PM EDT ----- #3 Paulla Fore

## 2020-03-02 NOTE — Telephone Encounter (Signed)
Moh's info sent through [place].

## 2020-03-04 ENCOUNTER — Encounter: Payer: Self-pay | Admitting: Physician Assistant

## 2020-03-07 DIAGNOSIS — C44329 Squamous cell carcinoma of skin of other parts of face: Secondary | ICD-10-CM | POA: Diagnosis not present

## 2020-03-07 DIAGNOSIS — Z79891 Long term (current) use of opiate analgesic: Secondary | ICD-10-CM | POA: Diagnosis not present

## 2020-03-07 DIAGNOSIS — Z510 Encounter for antineoplastic radiation therapy: Secondary | ICD-10-CM | POA: Diagnosis not present

## 2020-03-07 DIAGNOSIS — Z886 Allergy status to analgesic agent status: Secondary | ICD-10-CM | POA: Diagnosis not present

## 2020-03-07 DIAGNOSIS — K219 Gastro-esophageal reflux disease without esophagitis: Secondary | ICD-10-CM | POA: Diagnosis not present

## 2020-03-07 DIAGNOSIS — C792 Secondary malignant neoplasm of skin: Secondary | ICD-10-CM | POA: Diagnosis not present

## 2020-03-07 DIAGNOSIS — Z7982 Long term (current) use of aspirin: Secondary | ICD-10-CM | POA: Diagnosis not present

## 2020-03-07 DIAGNOSIS — Z944 Liver transplant status: Secondary | ICD-10-CM | POA: Diagnosis not present

## 2020-03-07 DIAGNOSIS — Z79899 Other long term (current) drug therapy: Secondary | ICD-10-CM | POA: Diagnosis not present

## 2020-03-07 DIAGNOSIS — Z90710 Acquired absence of both cervix and uterus: Secondary | ICD-10-CM | POA: Diagnosis not present

## 2020-03-07 DIAGNOSIS — Z9049 Acquired absence of other specified parts of digestive tract: Secondary | ICD-10-CM | POA: Diagnosis not present

## 2020-03-08 ENCOUNTER — Other Ambulatory Visit: Payer: Self-pay

## 2020-03-08 ENCOUNTER — Ambulatory Visit (HOSPITAL_COMMUNITY): Payer: Medicare Other | Admitting: Nurse Practitioner

## 2020-03-08 ENCOUNTER — Other Ambulatory Visit (HOSPITAL_COMMUNITY): Payer: Medicare Other

## 2020-03-08 ENCOUNTER — Inpatient Hospital Stay (HOSPITAL_COMMUNITY): Payer: Medicare Other | Attending: Hematology

## 2020-03-08 DIAGNOSIS — D508 Other iron deficiency anemias: Secondary | ICD-10-CM | POA: Insufficient documentation

## 2020-03-08 DIAGNOSIS — Z944 Liver transplant status: Secondary | ICD-10-CM | POA: Diagnosis not present

## 2020-03-08 DIAGNOSIS — Z90710 Acquired absence of both cervix and uterus: Secondary | ICD-10-CM | POA: Diagnosis not present

## 2020-03-08 DIAGNOSIS — C792 Secondary malignant neoplasm of skin: Secondary | ICD-10-CM | POA: Diagnosis not present

## 2020-03-08 DIAGNOSIS — Z7982 Long term (current) use of aspirin: Secondary | ICD-10-CM | POA: Diagnosis not present

## 2020-03-08 DIAGNOSIS — Z79899 Other long term (current) drug therapy: Secondary | ICD-10-CM | POA: Diagnosis not present

## 2020-03-08 DIAGNOSIS — Z9049 Acquired absence of other specified parts of digestive tract: Secondary | ICD-10-CM | POA: Diagnosis not present

## 2020-03-08 DIAGNOSIS — Z886 Allergy status to analgesic agent status: Secondary | ICD-10-CM | POA: Diagnosis not present

## 2020-03-08 DIAGNOSIS — K219 Gastro-esophageal reflux disease without esophagitis: Secondary | ICD-10-CM | POA: Diagnosis not present

## 2020-03-08 DIAGNOSIS — Z79891 Long term (current) use of opiate analgesic: Secondary | ICD-10-CM | POA: Diagnosis not present

## 2020-03-08 DIAGNOSIS — C44329 Squamous cell carcinoma of skin of other parts of face: Secondary | ICD-10-CM | POA: Diagnosis not present

## 2020-03-08 LAB — VITAMIN D 25 HYDROXY (VIT D DEFICIENCY, FRACTURES): Vit D, 25-Hydroxy: 58.74 ng/mL (ref 30–100)

## 2020-03-08 LAB — LACTATE DEHYDROGENASE: LDH: 201 U/L — ABNORMAL HIGH (ref 98–192)

## 2020-03-08 LAB — FOLATE: Folate: 16 ng/mL (ref >5.9)

## 2020-03-08 LAB — CBC WITH DIFFERENTIAL/PLATELET
Abs Immature Granulocytes: 0.02 10*3/uL (ref 0.00–0.07)
Basophils Absolute: 0.1 10*3/uL (ref 0.0–0.1)
Basophils Relative: 2 %
Eosinophils Absolute: 0.5 10*3/uL (ref 0.0–0.5)
Eosinophils Relative: 7 %
HCT: 40.2 % (ref 36.0–46.0)
Hemoglobin: 11.9 g/dL — ABNORMAL LOW (ref 12.0–15.0)
Immature Granulocytes: 0 %
Lymphocytes Relative: 28 %
Lymphs Abs: 2 10*3/uL (ref 0.7–4.0)
MCH: 28.1 pg (ref 26.0–34.0)
MCHC: 29.6 g/dL — ABNORMAL LOW (ref 30.0–36.0)
MCV: 94.8 fL (ref 80.0–100.0)
Monocytes Absolute: 1.1 10*3/uL — ABNORMAL HIGH (ref 0.1–1.0)
Monocytes Relative: 15 %
Neutro Abs: 3.4 10*3/uL (ref 1.7–7.7)
Neutrophils Relative %: 48 %
Platelets: 365 10*3/uL (ref 150–400)
RBC: 4.24 MIL/uL (ref 3.87–5.11)
RDW: 14.5 % (ref 11.5–15.5)
WBC: 7 10*3/uL (ref 4.0–10.5)
nRBC: 0 % (ref 0.0–0.2)

## 2020-03-08 LAB — COMPREHENSIVE METABOLIC PANEL
ALT: 20 U/L (ref 0–44)
AST: 27 U/L (ref 15–41)
Albumin: 3.6 g/dL (ref 3.5–5.0)
Alkaline Phosphatase: 144 U/L — ABNORMAL HIGH (ref 38–126)
Anion gap: 11 (ref 5–15)
BUN: 12 mg/dL (ref 8–23)
CO2: 27 mmol/L (ref 22–32)
Calcium: 9 mg/dL (ref 8.9–10.3)
Chloride: 100 mmol/L (ref 98–111)
Creatinine, Ser: 0.76 mg/dL (ref 0.44–1.00)
GFR calc Af Amer: >60 mL/min (ref >60)
GFR calc non Af Amer: >60 mL/min (ref >60)
Glucose, Bld: 102 mg/dL — ABNORMAL HIGH (ref 70–99)
Potassium: 4 mmol/L (ref 3.5–5.1)
Sodium: 138 mmol/L (ref 135–145)
Total Bilirubin: 0.4 mg/dL (ref 0.3–1.2)
Total Protein: 7.6 g/dL (ref 6.5–8.1)

## 2020-03-08 LAB — IRON AND TIBC
Iron: 59 ug/dL (ref 28–170)
Saturation Ratios: 20 % (ref 10.4–31.8)
TIBC: 293 ug/dL (ref 250–450)
UIBC: 234 ug/dL

## 2020-03-08 LAB — VITAMIN B12: Vitamin B-12: 4690 pg/mL — ABNORMAL HIGH (ref 180–914)

## 2020-03-08 LAB — FERRITIN: Ferritin: 95 ng/mL (ref 11–307)

## 2020-03-09 DIAGNOSIS — Z9049 Acquired absence of other specified parts of digestive tract: Secondary | ICD-10-CM | POA: Diagnosis not present

## 2020-03-09 DIAGNOSIS — Z886 Allergy status to analgesic agent status: Secondary | ICD-10-CM | POA: Diagnosis not present

## 2020-03-09 DIAGNOSIS — Z79899 Other long term (current) drug therapy: Secondary | ICD-10-CM | POA: Diagnosis not present

## 2020-03-09 DIAGNOSIS — K219 Gastro-esophageal reflux disease without esophagitis: Secondary | ICD-10-CM | POA: Diagnosis not present

## 2020-03-09 DIAGNOSIS — Z79891 Long term (current) use of opiate analgesic: Secondary | ICD-10-CM | POA: Diagnosis not present

## 2020-03-09 DIAGNOSIS — Z944 Liver transplant status: Secondary | ICD-10-CM | POA: Diagnosis not present

## 2020-03-09 DIAGNOSIS — C44329 Squamous cell carcinoma of skin of other parts of face: Secondary | ICD-10-CM | POA: Diagnosis not present

## 2020-03-09 DIAGNOSIS — Z7982 Long term (current) use of aspirin: Secondary | ICD-10-CM | POA: Diagnosis not present

## 2020-03-09 DIAGNOSIS — Z90710 Acquired absence of both cervix and uterus: Secondary | ICD-10-CM | POA: Diagnosis not present

## 2020-03-09 DIAGNOSIS — C792 Secondary malignant neoplasm of skin: Secondary | ICD-10-CM | POA: Diagnosis not present

## 2020-03-10 DIAGNOSIS — Z9049 Acquired absence of other specified parts of digestive tract: Secondary | ICD-10-CM | POA: Diagnosis not present

## 2020-03-10 DIAGNOSIS — Z886 Allergy status to analgesic agent status: Secondary | ICD-10-CM | POA: Diagnosis not present

## 2020-03-10 DIAGNOSIS — K219 Gastro-esophageal reflux disease without esophagitis: Secondary | ICD-10-CM | POA: Diagnosis not present

## 2020-03-10 DIAGNOSIS — Z79891 Long term (current) use of opiate analgesic: Secondary | ICD-10-CM | POA: Diagnosis not present

## 2020-03-10 DIAGNOSIS — C792 Secondary malignant neoplasm of skin: Secondary | ICD-10-CM | POA: Diagnosis not present

## 2020-03-10 DIAGNOSIS — Z944 Liver transplant status: Secondary | ICD-10-CM | POA: Diagnosis not present

## 2020-03-10 DIAGNOSIS — C44329 Squamous cell carcinoma of skin of other parts of face: Secondary | ICD-10-CM | POA: Diagnosis not present

## 2020-03-10 DIAGNOSIS — Z7982 Long term (current) use of aspirin: Secondary | ICD-10-CM | POA: Diagnosis not present

## 2020-03-10 DIAGNOSIS — Z79899 Other long term (current) drug therapy: Secondary | ICD-10-CM | POA: Diagnosis not present

## 2020-03-10 DIAGNOSIS — Z90710 Acquired absence of both cervix and uterus: Secondary | ICD-10-CM | POA: Diagnosis not present

## 2020-03-11 DIAGNOSIS — K219 Gastro-esophageal reflux disease without esophagitis: Secondary | ICD-10-CM | POA: Diagnosis not present

## 2020-03-11 DIAGNOSIS — Z79899 Other long term (current) drug therapy: Secondary | ICD-10-CM | POA: Diagnosis not present

## 2020-03-11 DIAGNOSIS — Z944 Liver transplant status: Secondary | ICD-10-CM | POA: Diagnosis not present

## 2020-03-11 DIAGNOSIS — Z886 Allergy status to analgesic agent status: Secondary | ICD-10-CM | POA: Diagnosis not present

## 2020-03-11 DIAGNOSIS — C792 Secondary malignant neoplasm of skin: Secondary | ICD-10-CM | POA: Diagnosis not present

## 2020-03-11 DIAGNOSIS — Z7982 Long term (current) use of aspirin: Secondary | ICD-10-CM | POA: Diagnosis not present

## 2020-03-11 DIAGNOSIS — Z90710 Acquired absence of both cervix and uterus: Secondary | ICD-10-CM | POA: Diagnosis not present

## 2020-03-11 DIAGNOSIS — C44329 Squamous cell carcinoma of skin of other parts of face: Secondary | ICD-10-CM | POA: Diagnosis not present

## 2020-03-11 DIAGNOSIS — Z9049 Acquired absence of other specified parts of digestive tract: Secondary | ICD-10-CM | POA: Diagnosis not present

## 2020-03-11 DIAGNOSIS — Z79891 Long term (current) use of opiate analgesic: Secondary | ICD-10-CM | POA: Diagnosis not present

## 2020-03-14 DIAGNOSIS — Z7982 Long term (current) use of aspirin: Secondary | ICD-10-CM | POA: Diagnosis not present

## 2020-03-14 DIAGNOSIS — K219 Gastro-esophageal reflux disease without esophagitis: Secondary | ICD-10-CM | POA: Diagnosis not present

## 2020-03-14 DIAGNOSIS — Z9049 Acquired absence of other specified parts of digestive tract: Secondary | ICD-10-CM | POA: Diagnosis not present

## 2020-03-14 DIAGNOSIS — Z510 Encounter for antineoplastic radiation therapy: Secondary | ICD-10-CM | POA: Diagnosis not present

## 2020-03-14 DIAGNOSIS — C44329 Squamous cell carcinoma of skin of other parts of face: Secondary | ICD-10-CM | POA: Diagnosis not present

## 2020-03-14 DIAGNOSIS — Z886 Allergy status to analgesic agent status: Secondary | ICD-10-CM | POA: Diagnosis not present

## 2020-03-14 DIAGNOSIS — Z944 Liver transplant status: Secondary | ICD-10-CM | POA: Diagnosis not present

## 2020-03-14 DIAGNOSIS — Z79891 Long term (current) use of opiate analgesic: Secondary | ICD-10-CM | POA: Diagnosis not present

## 2020-03-14 DIAGNOSIS — C792 Secondary malignant neoplasm of skin: Secondary | ICD-10-CM | POA: Diagnosis not present

## 2020-03-14 DIAGNOSIS — Z79899 Other long term (current) drug therapy: Secondary | ICD-10-CM | POA: Diagnosis not present

## 2020-03-15 ENCOUNTER — Other Ambulatory Visit: Payer: Medicare Other

## 2020-03-15 ENCOUNTER — Other Ambulatory Visit: Payer: Self-pay

## 2020-03-15 ENCOUNTER — Inpatient Hospital Stay (HOSPITAL_COMMUNITY): Payer: Medicare Other | Attending: Hematology | Admitting: Nurse Practitioner

## 2020-03-15 DIAGNOSIS — Z510 Encounter for antineoplastic radiation therapy: Secondary | ICD-10-CM | POA: Diagnosis not present

## 2020-03-15 DIAGNOSIS — Z944 Liver transplant status: Secondary | ICD-10-CM | POA: Diagnosis not present

## 2020-03-15 DIAGNOSIS — Z79891 Long term (current) use of opiate analgesic: Secondary | ICD-10-CM | POA: Diagnosis not present

## 2020-03-15 DIAGNOSIS — D508 Other iron deficiency anemias: Secondary | ICD-10-CM

## 2020-03-15 DIAGNOSIS — Z9049 Acquired absence of other specified parts of digestive tract: Secondary | ICD-10-CM | POA: Diagnosis not present

## 2020-03-15 DIAGNOSIS — Z79899 Other long term (current) drug therapy: Secondary | ICD-10-CM | POA: Diagnosis not present

## 2020-03-15 DIAGNOSIS — C792 Secondary malignant neoplasm of skin: Secondary | ICD-10-CM | POA: Diagnosis not present

## 2020-03-15 DIAGNOSIS — Z886 Allergy status to analgesic agent status: Secondary | ICD-10-CM | POA: Diagnosis not present

## 2020-03-15 DIAGNOSIS — Z7982 Long term (current) use of aspirin: Secondary | ICD-10-CM | POA: Diagnosis not present

## 2020-03-15 DIAGNOSIS — K219 Gastro-esophageal reflux disease without esophagitis: Secondary | ICD-10-CM | POA: Diagnosis not present

## 2020-03-15 DIAGNOSIS — C44329 Squamous cell carcinoma of skin of other parts of face: Secondary | ICD-10-CM | POA: Diagnosis not present

## 2020-03-15 NOTE — Progress Notes (Signed)
[facility] Follow up:    Cassidy Barber, [place] [address]   DIAGNOSIS: Iron deficiency anemia  CURRENT THERAPY: Intermittent iron infusions  INTERVAL HISTORY: Cassidy Barber 69 y.o. female was called for telephone visit today for iron deficiency anemia.  Patient reports she has been doing well since her last visit.  She reports no bleeding per rectum or melena.  She denies any easy bruising or bleeding. Denies any nausea, vomiting, or diarrhea. Denies any new pains. Had not noticed any recent bleeding such as epistaxis, hematuria or hematochezia. Denies recent chest pain on exertion, shortness of breath on minimal exertion, pre-syncopal episodes, or palpitations. Denies any numbness or tingling in hands or feet. Denies any recent fevers, infections, or recent hospitalizations. Patient reports appetite at 100% and energy level at 75%.  She is eating well maintain her weight at this time.    Patient Active Problem List   Diagnosis Date Noted  . S/P reverse total shoulder arthroplasty, left 11/12/2019  . Anterior dislocation of left shoulder 09/26/2019  . Hx of colonic polyps 09/04/2019  . Chest pain 09/04/2019  . Benzodiazepine dependence ([place]) 04/13/2019  . Controlled substance agreement signed 04/13/2019  . Urticaria 09/08/2018  . S/p reverse total shoulder arthroplasty 07/17/2018  . History of colon cancer 06/12/2018  . History of colonic polyps 06/12/2018  . 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  . Gastroesophageal reflux disease 06/12/2018  . Chronic diarrhea 06/26/2017  . Iron deficiency anemia 06/26/2017  . GAD (generalized anxiety disorder) 08/28/2016  . Vitamin D deficiency 08/28/2016  . Insomnia 08/28/2016  . Convulsions ([place]) 11/30/2015  . Liver transplant recipient [facility]) 11/30/2015  . Seizures ([place]) 09/27/2015  . Essential hypertension, benign 03/10/2015  . Hx of liver transplant ([place]) 12/08/2014  . Diarrhea 12/08/2014  .  Colon cancer ([place]) 12/08/2014  . Hepatic encephalopathy ([place]) 10/26/2013  . Anemia of chronic disease 10/26/2013  . Personal history of colonic polyps 12/27/2012  . PBC (primary biliary cirrhosis) 03/24/2012  . Hypothyroidism 03/24/2012  . Ventral hernia, recurrent 03/24/2012    is allergic to ciprofloxacin and codeine.  MEDICAL HISTORY: Past Medical History:  Diagnosis Date  . Abdominal wall hernia    Incarcerated status post surgical repair 2019 - [facility]  . Anemia of chronic disease   . Atypical nevus 01/21/2018   atypical neoplasm- Left scalp-ant (txpbx + MOHS), atypical neoplasm- Left scalp post- (txpbx + MOHS)  . Colon cancer [facility])    Colon surgery 2005 and 2012  . History of pulmonary hypertension    Pre liver transplant  . History of seizures   . Hypertension   . Hypothyroidism   . Lynch syndrome   . Osteopenia   . Primary biliary cirrhosis (HCC)    Status post liver transplantation - follows at [facility]  . Squamous cell carcinoma of skin 04/22/2018   KA-Right mid chest (txpbx), KA-left elbow crease (txpbx), insitu-Right mid chest inf. (exc)  . Squamous cell carcinoma of skin 05/20/2018   well diff-Left upper shin (txpbx), well diff-Right lower forearm (txpbx), well diff-Right upper shin (txpbx)  . Squamous cell carcinoma of skin 06/11/2018   Scc + margin-Right mid chest inferior   . Squamous cell carcinoma of skin 06/27/2018   well diff-Left mid thigh(txpbx), well diff-Left inner thigh (txpbx),insitu-Right cheek (txpbx),well diff-right inner heel (txpbx)  . Squamous cell carcinoma of skin 09/16/2018   well diff-left shoulder (txpbx), well diff-Right chin (txpbx), well diff-right chest lateral (CX35FU)  . Squamous  cell carcinoma of skin 10/01/2018   Right outer lower shin (Txpbx)  . Squamous cell carcinoma of skin 04/03/2019   well diff-Right center chest (MOHS), in situ-Right ear  . Squamous cell carcinoma of skin 08/05/2019   in situ-Left calf (txpbx), in situ-left  bicep (txpbx), well diff-Left chest,inf(txpbx), in situ-Right chest inf-(txpbx)  . Squamous cell carcinoma of skin 11/19/2019   KA-left top leg (txpbx), modify-Riight forehead-(mohs), in situ-right hand (txpbx), in situ-Right forearm (txpbx), well diff-Right chest (txpbx), well diff-chin (txpbx)  . Squamous cell carcinoma of skin 01/06/2020   KA- Left top leg  . Squamous cell carcinoma of skin 05/12/2013   bowens-middle of chest (CX35FU)  . Squamous cell carcinoma of skin 05/18/2015   well diff-Left upper arm (CX35FU + Exc),KA-right chest(txpbx), in situ-Left shin (txpbx), well diff-Right cheek (CX35FU), KA-Left post scalp (CX35FU)  . Squamous cell carcinoma of skin 08/09/2015   KA-Left post scalp ((MOHS), in situ- mid chest (Txpbx +exc), in situ-Left upper arm inferior (txpbx)  . Squamous cell carcinoma of skin 10/13/2015   Left upper arm-clear  . Squamous cell carcinoma of skin 03/09/2016   mod diff-mid chest (txpbx+ exc), mod diff-Right chest (txpbx+exc), well diff-right cheek-(txpbx),well diff-Left hand-(txpbx), in situ-Left upper arm (txpbx), well diff-Right cheek -(txpbx), well diff-Right crease arm (txpbx)   . Squamous cell carcinoma of skin 05/24/2016   well diff-Right nasal crease-(MOHS)  . Squamous cell carcinoma of skin 08/02/2016   KA-Left chest med (txpbx)  . Squamous cell carcinoma of skin 08/30/2016   in situ-Left outer zygoma (txpbx)  . Squamous cell carcinoma of skin 12/06/2016   well diff-Left chest sup, Left shoudler, insitu- right post scalp  . Squamous cell carcinoma of skin 02/14/2017   well diff-Left forearm (EXC),in situ-RIght ant neck  . Squamous cell carcinoma of skin 06/14/2017   in situ-Right forearm (txpbx), in situ-Right chest (txpbx), well diff-left chest (txpbx), well diff-anterior neck- (txpbx)  . Squamous cell carcinoma of skin 08/07/2017   well diff-Left upper shoulder (txpbx), sup and invasive-Left temple (txpbx), well diff-Right upper shin (txpbx), in  situ-Right clavicle (txpbx)  . Squamous cell carcinoma of skin 10/17/2017   well diff-ant. neck (MOHS), in situ-Right chest, inf (txpbx)  . Squamous cell carcinoma of skin 01/21/2018   well diff- Right chest,ulnar (txpbx), well diff- right upper chest (txpbx), in situ-Right ant. crown (txpbx)    SURGICAL HISTORY: Past Surgical History:  Procedure Laterality Date  . ABDOMINAL HERNIA REPAIR     Patient's states that she has had 8- 9 hernia surgeries  . ABDOMINAL HYSTERECTOMY    . CATARACT EXTRACTION W/PHACO Right 01/15/2020   Procedure: CATARACT EXTRACTION PHACO AND INTRAOCULAR LENS PLACEMENT (IOC);  Surgeon: Baruch Goldmann, MD;  Location: [facility];  Service: Ophthalmology;  Laterality: Right;  CDE: 7.89  . CATARACT EXTRACTION W/PHACO Left 01/29/2020   Procedure: CATARACT EXTRACTION PHACO AND INTRAOCULAR LENS PLACEMENT (IOC) (CDE: 6.33);  Surgeon: Baruch Goldmann, MD;  Location: [facility];  Service: Ophthalmology;  Laterality: Left;  . CHOLECYSTECTOMY  2007  . COLON RESECTION    . COLON SURGERY  2008   [facility] at [facility]  . COLONOSCOPY     Done at [facility]  . ESOPHAGOGASTRODUODENOSCOPY N/A 08/21/2018   Procedure: ESOPHAGOGASTRODUODENOSCOPY (EGD);  Surgeon: Rogene Houston, MD;  Location[facility];  Service: Endoscopy;  Laterality: N/A;  . ESOPHAGOGASTRODUODENOSCOPY (EGD) WITH PROPOFOL N/A 12/16/2019   Procedure: ESOPHAGOGASTRODUODENOSCOPY (EGD) WITH PROPOFOL;  Surgeon: Rogene Houston, MD;  [facility];  Service: Endoscopy;  Laterality: N/A;  . EYE SURGERY     lasix  . FLEXIBLE SIGMOIDOSCOPY N/A 10/20/2015   Procedure: FLEXIBLE SIGMOIDOSCOPY;  Surgeon: Rogene Houston, MD;  Location: [facility];  Service: Endoscopy;  Laterality: N/A;  31 - Dr Laural Golden has meeting until 1:00  . FLEXIBLE SIGMOIDOSCOPY N/A 07/11/2016   Procedure: FLEXIBLE SIGMOIDOSCOPY;  Surgeon: Rogene Houston, MD;  [facility];  Service: Endoscopy;  Laterality: N/A;  1200  . FLEXIBLE SIGMOIDOSCOPY N/A 08/09/2017    Procedure: FLEXIBLE SIGMOIDOSCOPY;  Surgeon: Rogene Houston, MD;  [facility];  Service: Endoscopy;  Laterality: N/A;  1:00  . FLEXIBLE SIGMOIDOSCOPY N/A 08/21/2018   Procedure: FLEXIBLE SIGMOIDOSCOPY;  Surgeon: Rogene Houston, MD;  [facility];  Service: Endoscopy;  Laterality: N/A;  . FLEXIBLE SIGMOIDOSCOPY N/A 12/16/2019   Procedure: FLEXIBLE SIGMOIDOSCOPY wirh Propofol;  Surgeon: Rogene Houston, MD;  [facility];  Service: Endoscopy;  Laterality: N/A;  7:30  . FRACTURE SURGERY     right wrist metal plate  . HERNIA REPAIR    . LIVER TRANSPLANT  [id number]  . multiple skin cancers removed    . POLYPECTOMY  08/09/2017   Procedure: POLYPECTOMY;  Surgeon: Rogene Houston, MD;  Location: [facility];  Service: Endoscopy;;  colon small bowel  . REVERSE SHOULDER ARTHROPLASTY Left 07/17/2018  . REVERSE SHOULDER ARTHROPLASTY Left 07/17/2018   Procedure: LEFT REVERSE SHOULDER ARTHROPLASTY;  Surgeon: Justice Britain, MD;  Location: [facility];  Service: Orthopedics;  Laterality: Left;  153min  . SHOULDER CLOSED REDUCTION Left 09/27/2019   Procedure: CLOSED REDUCTION SHOULDER;  Surgeon: Paralee Cancel, MD;  Location: [facility];  Service: Orthopedics;  Laterality: Left;  . SPLENECTOMY  2006  . TOTAL SHOULDER REVISION Left 11/12/2019   Procedure: Revision Left Reverse Shoulder Arthroplasty with poly exchange SDD;  Surgeon: Justice Britain, MD;  Location: [facility];  Service: Orthopedics;  Laterality: Left;  113min -SDDC  . TYMPANOSTOMY TUBE PLACEMENT    . UPPER GASTROINTESTINAL ENDOSCOPY     [facility]    SOCIAL HISTORY: Social History   Socioeconomic History  . Marital status: Married    Spouse name: Charlotte Crumb  . Number of children: 1  . Years of education: 11  . Highest education level: High school graduate  Occupational History  . Occupation: Disability    Employer: [employer]    Comment: Multimedia programmer  Tobacco Use  . Smoking status: Never Smoker   . Smokeless tobacco: Never Used  Substance and Sexual Activity  . Alcohol use: No    Alcohol/week: 0.0 standard drinks  . Drug use: No  . Sexual activity: Yes    Birth control/protection: None  Other Topics Concern  . Not on file  Social History Narrative   Patient lives in a two story home with her husband. She has an adult son. She is retired from being an Web designer for 30 years.    Social Determinants of Health   Financial Resource Strain:   . Difficulty of Paying Living Expenses:   Food Insecurity:   . Worried About Charity fundraiser in the Last Year:   . Arboriculturist in the Last Year:   Transportation Needs:   . Film/video editor (Medical):   [name] Lack of Transportation (Non-Medical):   Physical Activity: Inactive  . Days of Exercise per Week: 0 days  . Minutes of Exercise per Session: 0 min  Stress:   .  Feeling of Stress :   Social Connections:   . Frequency of Communication with Friends and Family:   . Frequency of Social Gatherings with Friends and Family:   . Attends Religious Services:   . Active Member of Clubs or Organizations:   . Attends Archivist Meetings:   [name] Marital Status:   Intimate Partner Violence:   . Fear of Current or Ex-Partner:   . Emotionally Abused:   [name] Physically Abused:   . Sexually Abused:     FAMILY HISTORY: Family History  Problem Relation Age of Onset  . Prostate cancer Father   . Colon cancer Father   . Colon cancer Sister   . Lung cancer Sister   . Healthy Son   . Alcohol abuse Brother   . Allergic rhinitis Neg Hx   . Asthma Neg Hx   . Eczema Neg Hx   . Urticaria Neg Hx     Review of Systems  All other systems reviewed and are negative.     Vital signs: -Deferred due to telephone visit  Physical Exam -Deferred due to telephone visit -Patient was alert and oriented over the phone and in no acute distress   LABORATORY DATA:  CBC    Component Value Date/Time   WBC 7.0  03/08/2020 1121   RBC 4.24 03/08/2020 1121   HGB 11.9 (L) 03/08/2020 1121   HGB 11.1 04/13/2019 0940   HCT 40.2 03/08/2020 1121   HCT 34.5 04/13/2019 0940   PLT 365 03/08/2020 1121   PLT 380 04/13/2019 0940   MCV 94.8 03/08/2020 1121   MCV 85 04/13/2019 0940   MCH 28.1 03/08/2020 1121   MCHC 29.6 (L) 03/08/2020 1121   RDW 14.5 03/08/2020 1121   RDW 13.0 04/13/2019 0940   LYMPHSABS 2.0 03/08/2020 1121   LYMPHSABS 2.4 04/13/2019 0940   MONOABS 1.1 (H) 03/08/2020 1121   EOSABS 0.5 03/08/2020 1121   EOSABS 0.5 (H) 04/13/2019 0940   BASOSABS 0.1 03/08/2020 1121   BASOSABS 0.2 04/13/2019 0940    CMP     Component Value Date/Time   NA 138 03/08/2020 1121   NA 143 04/13/2019 0940   K 4.0 03/08/2020 1121   CL 100 03/08/2020 1121   CO2 27 03/08/2020 1121   GLUCOSE 102 (H) 03/08/2020 1121   BUN 12 03/08/2020 1121   BUN 14 04/13/2019 0940   CREATININE 0.76 03/08/2020 1121   CREATININE 0.62 12/23/2012 1140   CALCIUM 9.0 03/08/2020 1121   PROT 7.6 03/08/2020 1121   PROT 6.9 04/13/2019 0940   ALBUMIN 3.6 03/08/2020 1121   ALBUMIN 3.9 04/13/2019 0940   AST 27 03/08/2020 1121   ALT 20 03/08/2020 1121   ALKPHOS 144 (H) 03/08/2020 1121   BILITOT 0.4 03/08/2020 1121   BILITOT <0.2 04/13/2019 0940   GFRNONAA >60 03/08/2020 1121   GFRAA >60 03/08/2020 1121     ASSESSMENT and THERAPY PLAN:   Iron deficiency anemia 1. Iron deficiency anemia: -She was unable to tolerate oral iron therapy in the past. -Last received Feraheme on 11/11/2019. -She denies any bleeding per rectum or melena. -She had a colonoscopy in the past secondary to Lynch syndrome. -Last visit patient's hemoglobin was 7.4.  Due to the patient's significant fatigue she received 1 unit of packed red blood cells.  Then 2 infusions of iron. -She is scheduled for an ERCP at [place] [facility] done on 03/08/2020 showed hemoglobin 11.9, ferritin 95, percent saturation 20. -I do not feel like  she needs any  iron infusions at this time. -We will see her back in 2 months with repeat labs.  2.  Thrombocytosis: -She was initially worked up for thrombocytosis with negative JAK2 mutations and BCR/ABL. -This is likely reactive to iron deficiency anemia. -Labs done on 03/08/2020 showed platelets 365. -They have normalized since we have replaced her iron.  3.  Primary biliary cirrhosis: -She had orthotopic liver transplant in 2015 at [facility]. -She is currently on sirolimus, ursodiol, valtrex.  4.  Osteoporosis: -She is on Fosamax 70 mg weekly.  5.  Skin cancer: -Patient reports she had a skin cancer removed off the top of her head. -She will be getting 5 weeks of radiation therapy.   Orders Placed This Encounter  Procedures  . Lactate dehydrogenase    Standing Status:   Future    Standing Expiration Date:   03/15/2021  . CBC with Differential/Platelet    Standing Status:   Future    Standing Expiration Date:   03/15/2021  . Comprehensive metabolic panel    Standing Status:   Future    Standing Expiration Date:   03/15/2021  . Ferritin    Standing Status:   Future    Standing Expiration Date:   03/15/2021  . Iron and TIBC    Standing Status:   Future    Standing Expiration Date:   03/15/2021  . Vitamin B12    Standing Status:   Future    Standing Expiration Date:   03/15/2021  . VITAMIN D 25 Hydroxy (Vit-D Deficiency, Fractures)    Standing Status:   Future    Standing Expiration Date:   03/15/2021    All questions were answered. The patient knows to call the clinic with any problems, questions or concerns. We can certainly see the patient much sooner if necessary. This note was electronically signed.  I provided 30 minutes of non face-to-face telephone visit time during this encounter, and > 50% was spent counseling as documented under my assessment & plan.   Glennie Isle, NP-C 03/15/2020

## 2020-03-15 NOTE — Assessment & Plan Note (Addendum)
1. Iron deficiency anemia: -She was unable to tolerate oral iron therapy in the past. -Last received Feraheme on 11/11/2019. -She denies any bleeding per rectum or melena. -She had a colonoscopy in the past secondary to Lynch syndrome. -Last visit patient's hemoglobin was 7.4.  Due to the patient's significant fatigue she received 1 unit of packed red blood cells.  Then 2 infusions of iron. -She is scheduled for an ERCP at [place] done on 03/08/2020 showed hemoglobin 11.9, ferritin 95, percent saturation 20. -I do not feel like she needs any iron infusions at this time. -We will see her back in 2 months with repeat labs.  2.  Thrombocytosis: -She was initially worked up for thrombocytosis with negative JAK2 mutations and BCR/ABL. -This is likely reactive to iron deficiency anemia. -Labs done on 03/08/2020 showed platelets 365. -They have normalized since we have replaced her iron.  3.  Primary biliary cirrhosis: -She had orthotopic liver transplant in 2015 at [facility]. -She is currently on sirolimus, ursodiol, valtrex.  4.  Osteoporosis: -She is on Fosamax 70 mg weekly.  5.  Skin cancer: -Patient reports she had a skin cancer removed off the top of her head. -She will be getting 5 weeks of radiation therapy.

## 2020-03-16 DIAGNOSIS — Z510 Encounter for antineoplastic radiation therapy: Secondary | ICD-10-CM | POA: Diagnosis not present

## 2020-03-16 DIAGNOSIS — Z944 Liver transplant status: Secondary | ICD-10-CM | POA: Diagnosis not present

## 2020-03-16 DIAGNOSIS — Z886 Allergy status to analgesic agent status: Secondary | ICD-10-CM | POA: Diagnosis not present

## 2020-03-16 DIAGNOSIS — Z79891 Long term (current) use of opiate analgesic: Secondary | ICD-10-CM | POA: Diagnosis not present

## 2020-03-16 DIAGNOSIS — Z7982 Long term (current) use of aspirin: Secondary | ICD-10-CM | POA: Diagnosis not present

## 2020-03-16 DIAGNOSIS — C792 Secondary malignant neoplasm of skin: Secondary | ICD-10-CM | POA: Diagnosis not present

## 2020-03-16 DIAGNOSIS — Z9049 Acquired absence of other specified parts of digestive tract: Secondary | ICD-10-CM | POA: Diagnosis not present

## 2020-03-16 DIAGNOSIS — Z79899 Other long term (current) drug therapy: Secondary | ICD-10-CM | POA: Diagnosis not present

## 2020-03-16 DIAGNOSIS — K219 Gastro-esophageal reflux disease without esophagitis: Secondary | ICD-10-CM | POA: Diagnosis not present

## 2020-03-16 DIAGNOSIS — C44329 Squamous cell carcinoma of skin of other parts of face: Secondary | ICD-10-CM | POA: Diagnosis not present

## 2020-03-17 ENCOUNTER — Ambulatory Visit (INDEPENDENT_AMBULATORY_CARE_PROVIDER_SITE_OTHER): Payer: Medicare Other | Admitting: Family

## 2020-03-17 ENCOUNTER — Encounter: Payer: Self-pay | Admitting: Family

## 2020-03-17 DIAGNOSIS — Z886 Allergy status to analgesic agent status: Secondary | ICD-10-CM | POA: Diagnosis not present

## 2020-03-17 DIAGNOSIS — Z944 Liver transplant status: Secondary | ICD-10-CM

## 2020-03-17 DIAGNOSIS — Z79899 Other long term (current) drug therapy: Secondary | ICD-10-CM | POA: Diagnosis not present

## 2020-03-17 DIAGNOSIS — F411 Generalized anxiety disorder: Secondary | ICD-10-CM

## 2020-03-17 DIAGNOSIS — K219 Gastro-esophageal reflux disease without esophagitis: Secondary | ICD-10-CM

## 2020-03-17 DIAGNOSIS — Z510 Encounter for antineoplastic radiation therapy: Secondary | ICD-10-CM | POA: Diagnosis not present

## 2020-03-17 DIAGNOSIS — I10 Essential (primary) hypertension: Secondary | ICD-10-CM

## 2020-03-17 DIAGNOSIS — E039 Hypothyroidism, unspecified: Secondary | ICD-10-CM

## 2020-03-17 DIAGNOSIS — Z79891 Long term (current) use of opiate analgesic: Secondary | ICD-10-CM | POA: Diagnosis not present

## 2020-03-17 DIAGNOSIS — D638 Anemia in other chronic diseases classified elsewhere: Secondary | ICD-10-CM

## 2020-03-17 DIAGNOSIS — L509 Urticaria, unspecified: Secondary | ICD-10-CM

## 2020-03-17 DIAGNOSIS — Z7982 Long term (current) use of aspirin: Secondary | ICD-10-CM | POA: Diagnosis not present

## 2020-03-17 DIAGNOSIS — F1320 Sedative, hypnotic or anxiolytic dependence, uncomplicated: Secondary | ICD-10-CM

## 2020-03-17 DIAGNOSIS — C44329 Squamous cell carcinoma of skin of other parts of face: Secondary | ICD-10-CM | POA: Diagnosis not present

## 2020-03-17 DIAGNOSIS — C792 Secondary malignant neoplasm of skin: Secondary | ICD-10-CM | POA: Diagnosis not present

## 2020-03-17 DIAGNOSIS — G4700 Insomnia, unspecified: Secondary | ICD-10-CM

## 2020-03-17 DIAGNOSIS — Z9049 Acquired absence of other specified parts of digestive tract: Secondary | ICD-10-CM | POA: Diagnosis not present

## 2020-03-17 MED ORDER — LEVOTHYROXINE SODIUM 125 MCG PO TABS: 125.0000 ug | ORAL_TABLET | Freq: Every day | ORAL | 3 refills | Status: DC

## 2020-03-17 MED ORDER — FAMOTIDINE 20 MG PO TABS: 20.0000 mg | ORAL_TABLET | Freq: Two times a day (BID) | ORAL | 3 refills | Status: DC

## 2020-03-17 MED ORDER — ALPRAZOLAM 0.5 MG PO TABS: 0.5000 mg | ORAL_TABLET | Freq: Every day | ORAL | 1 refills | Status: DC

## 2020-03-17 MED ORDER — METOPROLOL SUCCINATE ER 25 MG PO TB24: 25.0000 mg | ORAL_TABLET | Freq: Every day | ORAL | 4 refills | Status: DC

## 2020-03-17 NOTE — Progress Notes (Signed)
Virtual Visit via telephone Note Due to COVID-19 pandemic this visit was conducted virtually. This visit type was conducted due to national recommendations for restrictions regarding the COVID-19 Pandemic (e.g. social distancing, sheltering in place) in an effort to limit this patient's exposure and mitigate transmission in our community. All issues noted in this document were discussed and addressed.  A physical exam was not performed with this format.  I connected with Cassidy Barber on 03/17/20 at 10:48 AM by telephone and verified that I am speaking with the correct person using two identifiers. Cassidy Barber is currently located at home and her husband is currently with her during visit. The provider, [name], FNP is located in their office at time of visit.  I discussed the limitations, risks, security and privacy concerns of performing an evaluation and management service by telephone and the availability of in person appointments. I also discussed with the patient that there may be a patient responsible charge related to this service. The patient expressed understanding and agreed to proceed.   History and Present Illness:  PT presents to the office today for chronic follow up. She is followed by [facility] for hx of liver transplant. She had an incarcerated ventral hernia with fluid In the hernia sac 04/05/18. She is followed by Hemologists every 3 months for iron deficiency anemia.   She had a left reverse shoulder 07/17/18 and then had to redone in 09/27/19. She currently has skin cancer on her scalp and is receiving 30 radiation treatments.  Hypertension This is a chronic problem. The current episode started more than 1 year ago. The problem has been resolved since onset. The problem is controlled. Associated symptoms include anxiety and malaise/fatigue. Pertinent negatives include no peripheral edema or shortness of breath. Risk factors for coronary artery disease include dyslipidemia  and sedentary lifestyle. The current treatment provides moderate improvement. Identifiable causes of hypertension include a thyroid problem.  Thyroid Problem Presents for follow-up visit. Symptoms include anxiety, depressed mood and fatigue. Patient reports no constipation or diaphoresis. The symptoms have been stable.  Anxiety Presents for follow-up visit. Symptoms include depressed mood, excessive worry, irritability and nervous/anxious behavior. Patient reports no shortness of breath. Symptoms occur occasionally. The severity of symptoms is moderate.   Her past medical history is significant for anemia.  Anemia Presents for follow-up visit. Symptoms include bruises/bleeds easily and malaise/fatigue.      Review of Systems  Constitutional: Positive for fatigue, irritability and malaise/fatigue. Negative for diaphoresis.  Respiratory: Negative for shortness of breath.   Gastrointestinal: Negative for constipation.  Endo/Heme/Allergies: Bruises/bleeds easily.  Psychiatric/Behavioral: The patient is nervous/anxious.   All other systems reviewed and are negative.    Observations/Objective: No SOB or distress noted   Assessment and Plan: Cassidy Barber comes in today with chief complaint of No chief complaint on file.   Diagnosis and orders addressed:  1. Essential hypertension, benign - metoprolol succinate (TOPROL XL) 25 MG 24 hr tablet; Take 1 tablet (25 mg total) by mouth daily.  Dispense: 90 tablet; Refill: 4  2. Gastroesophageal reflux disease, unspecified whether esophagitis present  3. Acquired hypothyroidism - levothyroxine (SYNTHROID) 125 MCG tablet; Take 1 tablet (125 mcg total) by mouth daily before breakfast.  Dispense: 90 tablet; Refill: 3  4. Anemia of chronic disease  5. Benzodiazepine dependence (HCC) - ALPRAZolam (XANAX) 0.5 MG tablet; Take 1 tablet (0.5 mg total) by mouth at bedtime.  Dispense: 90 tablet; Refill: 1  6. Controlled substance agreement signed  -  ALPRAZolam (XANAX) 0.5 MG tablet; Take 1 tablet (0.5 mg total) by mouth at bedtime.  Dispense: 90 tablet; Refill: 1  7. GAD (generalized anxiety disorder) - ALPRAZolam (XANAX) 0.5 MG tablet; Take 1 tablet (0.5 mg total) by mouth at bedtime.  Dispense: 90 tablet; Refill: 1  8. Hx of liver transplant ([place])  9. Insomnia, unspecified type  10. Urticaria - famotidine (PEPCID) 20 MG tablet; Take 1 tablet (20 mg total) by mouth 2 (two) times daily.  Dispense: 160 tablet; Refill: 3   Labs reviewed Pt reviewed in [place] controlled database- No red flags noted, contract and drug up to date.  Health Maintenance reviewed Diet and exercise encouraged  Follow up plan: 6 months      I discussed the assessment and treatment plan with the patient. The patient was provided an opportunity to ask questions and all were answered. The patient agreed with the plan and demonstrated an understanding of the instructions.   The patient was advised to call back or seek an in-person evaluation if the symptoms worsen or if the condition fails to improve as anticipated.  The above assessment and management plan was discussed with the patient. The patient verbalized understanding of and has agreed to the management plan. Patient is aware to call the clinic if symptoms persist or worsen. Patient is aware when to return to the clinic for a follow-up visit. Patient educated on when it is appropriate to go to the emergency department.   Time call ended:  11:07  I provided 19 minutes of non-face-to-face time during this encounter.    [name], FNP

## 2020-03-18 DIAGNOSIS — K219 Gastro-esophageal reflux disease without esophagitis: Secondary | ICD-10-CM | POA: Diagnosis not present

## 2020-03-18 DIAGNOSIS — Z79891 Long term (current) use of opiate analgesic: Secondary | ICD-10-CM | POA: Diagnosis not present

## 2020-03-18 DIAGNOSIS — C44329 Squamous cell carcinoma of skin of other parts of face: Secondary | ICD-10-CM | POA: Diagnosis not present

## 2020-03-18 DIAGNOSIS — C792 Secondary malignant neoplasm of skin: Secondary | ICD-10-CM | POA: Diagnosis not present

## 2020-03-18 DIAGNOSIS — Z9049 Acquired absence of other specified parts of digestive tract: Secondary | ICD-10-CM | POA: Diagnosis not present

## 2020-03-18 DIAGNOSIS — Z510 Encounter for antineoplastic radiation therapy: Secondary | ICD-10-CM | POA: Diagnosis not present

## 2020-03-18 DIAGNOSIS — Z944 Liver transplant status: Secondary | ICD-10-CM | POA: Diagnosis not present

## 2020-03-18 DIAGNOSIS — Z79899 Other long term (current) drug therapy: Secondary | ICD-10-CM | POA: Diagnosis not present

## 2020-03-18 DIAGNOSIS — Z886 Allergy status to analgesic agent status: Secondary | ICD-10-CM | POA: Diagnosis not present

## 2020-03-18 DIAGNOSIS — Z7982 Long term (current) use of aspirin: Secondary | ICD-10-CM | POA: Diagnosis not present

## 2020-03-21 DIAGNOSIS — Z510 Encounter for antineoplastic radiation therapy: Secondary | ICD-10-CM | POA: Diagnosis not present

## 2020-03-21 DIAGNOSIS — Z886 Allergy status to analgesic agent status: Secondary | ICD-10-CM | POA: Diagnosis not present

## 2020-03-21 DIAGNOSIS — K219 Gastro-esophageal reflux disease without esophagitis: Secondary | ICD-10-CM | POA: Diagnosis not present

## 2020-03-21 DIAGNOSIS — C792 Secondary malignant neoplasm of skin: Secondary | ICD-10-CM | POA: Diagnosis not present

## 2020-03-21 DIAGNOSIS — Z944 Liver transplant status: Secondary | ICD-10-CM | POA: Diagnosis not present

## 2020-03-21 DIAGNOSIS — Z7982 Long term (current) use of aspirin: Secondary | ICD-10-CM | POA: Diagnosis not present

## 2020-03-21 DIAGNOSIS — Z79891 Long term (current) use of opiate analgesic: Secondary | ICD-10-CM | POA: Diagnosis not present

## 2020-03-21 DIAGNOSIS — Z9049 Acquired absence of other specified parts of digestive tract: Secondary | ICD-10-CM | POA: Diagnosis not present

## 2020-03-21 DIAGNOSIS — C44329 Squamous cell carcinoma of skin of other parts of face: Secondary | ICD-10-CM | POA: Diagnosis not present

## 2020-03-21 DIAGNOSIS — Z79899 Other long term (current) drug therapy: Secondary | ICD-10-CM | POA: Diagnosis not present

## 2020-03-22 DIAGNOSIS — C44329 Squamous cell carcinoma of skin of other parts of face: Secondary | ICD-10-CM | POA: Diagnosis not present

## 2020-03-22 DIAGNOSIS — K219 Gastro-esophageal reflux disease without esophagitis: Secondary | ICD-10-CM | POA: Diagnosis not present

## 2020-03-22 DIAGNOSIS — Z7982 Long term (current) use of aspirin: Secondary | ICD-10-CM | POA: Diagnosis not present

## 2020-03-22 DIAGNOSIS — Z944 Liver transplant status: Secondary | ICD-10-CM | POA: Diagnosis not present

## 2020-03-22 DIAGNOSIS — Z510 Encounter for antineoplastic radiation therapy: Secondary | ICD-10-CM | POA: Diagnosis not present

## 2020-03-22 DIAGNOSIS — C792 Secondary malignant neoplasm of skin: Secondary | ICD-10-CM | POA: Diagnosis not present

## 2020-03-22 DIAGNOSIS — Z886 Allergy status to analgesic agent status: Secondary | ICD-10-CM | POA: Diagnosis not present

## 2020-03-22 DIAGNOSIS — Z9049 Acquired absence of other specified parts of digestive tract: Secondary | ICD-10-CM | POA: Diagnosis not present

## 2020-03-22 DIAGNOSIS — Z79899 Other long term (current) drug therapy: Secondary | ICD-10-CM | POA: Diagnosis not present

## 2020-03-22 DIAGNOSIS — Z79891 Long term (current) use of opiate analgesic: Secondary | ICD-10-CM | POA: Diagnosis not present

## 2020-03-23 DIAGNOSIS — C792 Secondary malignant neoplasm of skin: Secondary | ICD-10-CM | POA: Diagnosis not present

## 2020-03-23 DIAGNOSIS — K219 Gastro-esophageal reflux disease without esophagitis: Secondary | ICD-10-CM | POA: Diagnosis not present

## 2020-03-23 DIAGNOSIS — Z944 Liver transplant status: Secondary | ICD-10-CM | POA: Diagnosis not present

## 2020-03-23 DIAGNOSIS — Z510 Encounter for antineoplastic radiation therapy: Secondary | ICD-10-CM | POA: Diagnosis not present

## 2020-03-23 DIAGNOSIS — Z79891 Long term (current) use of opiate analgesic: Secondary | ICD-10-CM | POA: Diagnosis not present

## 2020-03-23 DIAGNOSIS — Z79899 Other long term (current) drug therapy: Secondary | ICD-10-CM | POA: Diagnosis not present

## 2020-03-23 DIAGNOSIS — Z886 Allergy status to analgesic agent status: Secondary | ICD-10-CM | POA: Diagnosis not present

## 2020-03-23 DIAGNOSIS — Z7982 Long term (current) use of aspirin: Secondary | ICD-10-CM | POA: Diagnosis not present

## 2020-03-23 DIAGNOSIS — C44329 Squamous cell carcinoma of skin of other parts of face: Secondary | ICD-10-CM | POA: Diagnosis not present

## 2020-03-23 DIAGNOSIS — Z9049 Acquired absence of other specified parts of digestive tract: Secondary | ICD-10-CM | POA: Diagnosis not present

## 2020-03-24 DIAGNOSIS — Z7982 Long term (current) use of aspirin: Secondary | ICD-10-CM | POA: Diagnosis not present

## 2020-03-24 DIAGNOSIS — C792 Secondary malignant neoplasm of skin: Secondary | ICD-10-CM | POA: Diagnosis not present

## 2020-03-24 DIAGNOSIS — Z510 Encounter for antineoplastic radiation therapy: Secondary | ICD-10-CM | POA: Diagnosis not present

## 2020-03-24 DIAGNOSIS — C44329 Squamous cell carcinoma of skin of other parts of face: Secondary | ICD-10-CM | POA: Diagnosis not present

## 2020-03-24 DIAGNOSIS — Z79891 Long term (current) use of opiate analgesic: Secondary | ICD-10-CM | POA: Diagnosis not present

## 2020-03-24 DIAGNOSIS — Z944 Liver transplant status: Secondary | ICD-10-CM | POA: Diagnosis not present

## 2020-03-24 DIAGNOSIS — Z886 Allergy status to analgesic agent status: Secondary | ICD-10-CM | POA: Diagnosis not present

## 2020-03-24 DIAGNOSIS — K219 Gastro-esophageal reflux disease without esophagitis: Secondary | ICD-10-CM | POA: Diagnosis not present

## 2020-03-24 DIAGNOSIS — Z9049 Acquired absence of other specified parts of digestive tract: Secondary | ICD-10-CM | POA: Diagnosis not present

## 2020-03-24 DIAGNOSIS — Z79899 Other long term (current) drug therapy: Secondary | ICD-10-CM | POA: Diagnosis not present

## 2020-03-25 DIAGNOSIS — Z9049 Acquired absence of other specified parts of digestive tract: Secondary | ICD-10-CM | POA: Diagnosis not present

## 2020-03-25 DIAGNOSIS — C44329 Squamous cell carcinoma of skin of other parts of face: Secondary | ICD-10-CM | POA: Diagnosis not present

## 2020-03-25 DIAGNOSIS — Z944 Liver transplant status: Secondary | ICD-10-CM | POA: Diagnosis not present

## 2020-03-25 DIAGNOSIS — K219 Gastro-esophageal reflux disease without esophagitis: Secondary | ICD-10-CM | POA: Diagnosis not present

## 2020-03-25 DIAGNOSIS — Z886 Allergy status to analgesic agent status: Secondary | ICD-10-CM | POA: Diagnosis not present

## 2020-03-25 DIAGNOSIS — Z7982 Long term (current) use of aspirin: Secondary | ICD-10-CM | POA: Diagnosis not present

## 2020-03-25 DIAGNOSIS — Z79891 Long term (current) use of opiate analgesic: Secondary | ICD-10-CM | POA: Diagnosis not present

## 2020-03-25 DIAGNOSIS — Z510 Encounter for antineoplastic radiation therapy: Secondary | ICD-10-CM | POA: Diagnosis not present

## 2020-03-25 DIAGNOSIS — C792 Secondary malignant neoplasm of skin: Secondary | ICD-10-CM | POA: Diagnosis not present

## 2020-03-25 DIAGNOSIS — Z79899 Other long term (current) drug therapy: Secondary | ICD-10-CM | POA: Diagnosis not present

## 2020-03-28 DIAGNOSIS — Z79891 Long term (current) use of opiate analgesic: Secondary | ICD-10-CM | POA: Diagnosis not present

## 2020-03-28 DIAGNOSIS — Z510 Encounter for antineoplastic radiation therapy: Secondary | ICD-10-CM | POA: Diagnosis not present

## 2020-03-28 DIAGNOSIS — Z9049 Acquired absence of other specified parts of digestive tract: Secondary | ICD-10-CM | POA: Diagnosis not present

## 2020-03-28 DIAGNOSIS — K219 Gastro-esophageal reflux disease without esophagitis: Secondary | ICD-10-CM | POA: Diagnosis not present

## 2020-03-28 DIAGNOSIS — Z944 Liver transplant status: Secondary | ICD-10-CM | POA: Diagnosis not present

## 2020-03-28 DIAGNOSIS — C792 Secondary malignant neoplasm of skin: Secondary | ICD-10-CM | POA: Diagnosis not present

## 2020-03-28 DIAGNOSIS — Z886 Allergy status to analgesic agent status: Secondary | ICD-10-CM | POA: Diagnosis not present

## 2020-03-28 DIAGNOSIS — C44329 Squamous cell carcinoma of skin of other parts of face: Secondary | ICD-10-CM | POA: Diagnosis not present

## 2020-03-28 DIAGNOSIS — Z79899 Other long term (current) drug therapy: Secondary | ICD-10-CM | POA: Diagnosis not present

## 2020-03-28 DIAGNOSIS — Z7982 Long term (current) use of aspirin: Secondary | ICD-10-CM | POA: Diagnosis not present

## 2020-03-29 DIAGNOSIS — Z510 Encounter for antineoplastic radiation therapy: Secondary | ICD-10-CM | POA: Diagnosis not present

## 2020-03-29 DIAGNOSIS — C44329 Squamous cell carcinoma of skin of other parts of face: Secondary | ICD-10-CM | POA: Diagnosis not present

## 2020-03-29 DIAGNOSIS — K219 Gastro-esophageal reflux disease without esophagitis: Secondary | ICD-10-CM | POA: Diagnosis not present

## 2020-03-29 DIAGNOSIS — Z79891 Long term (current) use of opiate analgesic: Secondary | ICD-10-CM | POA: Diagnosis not present

## 2020-03-29 DIAGNOSIS — Z9049 Acquired absence of other specified parts of digestive tract: Secondary | ICD-10-CM | POA: Diagnosis not present

## 2020-03-29 DIAGNOSIS — Z79899 Other long term (current) drug therapy: Secondary | ICD-10-CM | POA: Diagnosis not present

## 2020-03-29 DIAGNOSIS — Z944 Liver transplant status: Secondary | ICD-10-CM | POA: Diagnosis not present

## 2020-03-29 DIAGNOSIS — Z886 Allergy status to analgesic agent status: Secondary | ICD-10-CM | POA: Diagnosis not present

## 2020-03-29 DIAGNOSIS — Z7982 Long term (current) use of aspirin: Secondary | ICD-10-CM | POA: Diagnosis not present

## 2020-03-29 DIAGNOSIS — C792 Secondary malignant neoplasm of skin: Secondary | ICD-10-CM | POA: Diagnosis not present

## 2020-03-30 DIAGNOSIS — Z944 Liver transplant status: Secondary | ICD-10-CM | POA: Diagnosis not present

## 2020-03-30 DIAGNOSIS — Z510 Encounter for antineoplastic radiation therapy: Secondary | ICD-10-CM | POA: Diagnosis not present

## 2020-03-30 DIAGNOSIS — C44329 Squamous cell carcinoma of skin of other parts of face: Secondary | ICD-10-CM | POA: Diagnosis not present

## 2020-03-30 DIAGNOSIS — Z9049 Acquired absence of other specified parts of digestive tract: Secondary | ICD-10-CM | POA: Diagnosis not present

## 2020-03-30 DIAGNOSIS — C792 Secondary malignant neoplasm of skin: Secondary | ICD-10-CM | POA: Diagnosis not present

## 2020-03-30 DIAGNOSIS — Z886 Allergy status to analgesic agent status: Secondary | ICD-10-CM | POA: Diagnosis not present

## 2020-03-30 DIAGNOSIS — Z79899 Other long term (current) drug therapy: Secondary | ICD-10-CM | POA: Diagnosis not present

## 2020-03-30 DIAGNOSIS — Z79891 Long term (current) use of opiate analgesic: Secondary | ICD-10-CM | POA: Diagnosis not present

## 2020-03-30 DIAGNOSIS — K219 Gastro-esophageal reflux disease without esophagitis: Secondary | ICD-10-CM | POA: Diagnosis not present

## 2020-03-30 DIAGNOSIS — Z7982 Long term (current) use of aspirin: Secondary | ICD-10-CM | POA: Diagnosis not present

## 2020-03-31 DIAGNOSIS — C792 Secondary malignant neoplasm of skin: Secondary | ICD-10-CM | POA: Diagnosis not present

## 2020-03-31 DIAGNOSIS — Z79891 Long term (current) use of opiate analgesic: Secondary | ICD-10-CM | POA: Diagnosis not present

## 2020-03-31 DIAGNOSIS — Z944 Liver transplant status: Secondary | ICD-10-CM | POA: Diagnosis not present

## 2020-03-31 DIAGNOSIS — Z7982 Long term (current) use of aspirin: Secondary | ICD-10-CM | POA: Diagnosis not present

## 2020-03-31 DIAGNOSIS — Z886 Allergy status to analgesic agent status: Secondary | ICD-10-CM | POA: Diagnosis not present

## 2020-03-31 DIAGNOSIS — K219 Gastro-esophageal reflux disease without esophagitis: Secondary | ICD-10-CM | POA: Diagnosis not present

## 2020-03-31 DIAGNOSIS — Z79899 Other long term (current) drug therapy: Secondary | ICD-10-CM | POA: Diagnosis not present

## 2020-03-31 DIAGNOSIS — Z510 Encounter for antineoplastic radiation therapy: Secondary | ICD-10-CM | POA: Diagnosis not present

## 2020-03-31 DIAGNOSIS — Z9049 Acquired absence of other specified parts of digestive tract: Secondary | ICD-10-CM | POA: Diagnosis not present

## 2020-03-31 DIAGNOSIS — C44329 Squamous cell carcinoma of skin of other parts of face: Secondary | ICD-10-CM | POA: Diagnosis not present

## 2020-04-01 DIAGNOSIS — Z9049 Acquired absence of other specified parts of digestive tract: Secondary | ICD-10-CM | POA: Diagnosis not present

## 2020-04-01 DIAGNOSIS — K219 Gastro-esophageal reflux disease without esophagitis: Secondary | ICD-10-CM | POA: Diagnosis not present

## 2020-04-01 DIAGNOSIS — Z510 Encounter for antineoplastic radiation therapy: Secondary | ICD-10-CM | POA: Diagnosis not present

## 2020-04-01 DIAGNOSIS — Z886 Allergy status to analgesic agent status: Secondary | ICD-10-CM | POA: Diagnosis not present

## 2020-04-01 DIAGNOSIS — C792 Secondary malignant neoplasm of skin: Secondary | ICD-10-CM | POA: Diagnosis not present

## 2020-04-01 DIAGNOSIS — Z944 Liver transplant status: Secondary | ICD-10-CM | POA: Diagnosis not present

## 2020-04-01 DIAGNOSIS — Z79899 Other long term (current) drug therapy: Secondary | ICD-10-CM | POA: Diagnosis not present

## 2020-04-01 DIAGNOSIS — C44329 Squamous cell carcinoma of skin of other parts of face: Secondary | ICD-10-CM | POA: Diagnosis not present

## 2020-04-01 DIAGNOSIS — Z7982 Long term (current) use of aspirin: Secondary | ICD-10-CM | POA: Diagnosis not present

## 2020-04-01 DIAGNOSIS — Z79891 Long term (current) use of opiate analgesic: Secondary | ICD-10-CM | POA: Diagnosis not present

## 2020-04-04 DIAGNOSIS — Z510 Encounter for antineoplastic radiation therapy: Secondary | ICD-10-CM | POA: Diagnosis not present

## 2020-04-04 DIAGNOSIS — Z79891 Long term (current) use of opiate analgesic: Secondary | ICD-10-CM | POA: Diagnosis not present

## 2020-04-04 DIAGNOSIS — C44329 Squamous cell carcinoma of skin of other parts of face: Secondary | ICD-10-CM | POA: Diagnosis not present

## 2020-04-04 DIAGNOSIS — K219 Gastro-esophageal reflux disease without esophagitis: Secondary | ICD-10-CM | POA: Diagnosis not present

## 2020-04-04 DIAGNOSIS — Z944 Liver transplant status: Secondary | ICD-10-CM | POA: Diagnosis not present

## 2020-04-04 DIAGNOSIS — Z79899 Other long term (current) drug therapy: Secondary | ICD-10-CM | POA: Diagnosis not present

## 2020-04-04 DIAGNOSIS — Z7982 Long term (current) use of aspirin: Secondary | ICD-10-CM | POA: Diagnosis not present

## 2020-04-04 DIAGNOSIS — C792 Secondary malignant neoplasm of skin: Secondary | ICD-10-CM | POA: Diagnosis not present

## 2020-04-04 DIAGNOSIS — Z886 Allergy status to analgesic agent status: Secondary | ICD-10-CM | POA: Diagnosis not present

## 2020-04-04 DIAGNOSIS — C44529 Squamous cell carcinoma of skin of other part of trunk: Secondary | ICD-10-CM | POA: Diagnosis not present

## 2020-04-04 DIAGNOSIS — Z9049 Acquired absence of other specified parts of digestive tract: Secondary | ICD-10-CM | POA: Diagnosis not present

## 2020-04-05 ENCOUNTER — Telehealth: Payer: Self-pay | Admitting: Physician Assistant

## 2020-04-05 DIAGNOSIS — Z510 Encounter for antineoplastic radiation therapy: Secondary | ICD-10-CM | POA: Diagnosis not present

## 2020-04-05 DIAGNOSIS — C792 Secondary malignant neoplasm of skin: Secondary | ICD-10-CM | POA: Diagnosis not present

## 2020-04-05 DIAGNOSIS — Z79891 Long term (current) use of opiate analgesic: Secondary | ICD-10-CM | POA: Diagnosis not present

## 2020-04-05 DIAGNOSIS — Z7982 Long term (current) use of aspirin: Secondary | ICD-10-CM | POA: Diagnosis not present

## 2020-04-05 DIAGNOSIS — K219 Gastro-esophageal reflux disease without esophagitis: Secondary | ICD-10-CM | POA: Diagnosis not present

## 2020-04-05 DIAGNOSIS — Z79899 Other long term (current) drug therapy: Secondary | ICD-10-CM | POA: Diagnosis not present

## 2020-04-05 DIAGNOSIS — Z944 Liver transplant status: Secondary | ICD-10-CM | POA: Diagnosis not present

## 2020-04-05 DIAGNOSIS — Z9049 Acquired absence of other specified parts of digestive tract: Secondary | ICD-10-CM | POA: Diagnosis not present

## 2020-04-05 DIAGNOSIS — Z886 Allergy status to analgesic agent status: Secondary | ICD-10-CM | POA: Diagnosis not present

## 2020-04-05 DIAGNOSIS — C44329 Squamous cell carcinoma of skin of other parts of face: Secondary | ICD-10-CM | POA: Diagnosis not present

## 2020-04-06 DIAGNOSIS — Z886 Allergy status to analgesic agent status: Secondary | ICD-10-CM | POA: Diagnosis not present

## 2020-04-06 DIAGNOSIS — K219 Gastro-esophageal reflux disease without esophagitis: Secondary | ICD-10-CM | POA: Diagnosis not present

## 2020-04-06 DIAGNOSIS — C44329 Squamous cell carcinoma of skin of other parts of face: Secondary | ICD-10-CM | POA: Diagnosis not present

## 2020-04-06 DIAGNOSIS — Z7982 Long term (current) use of aspirin: Secondary | ICD-10-CM | POA: Diagnosis not present

## 2020-04-06 DIAGNOSIS — Z9049 Acquired absence of other specified parts of digestive tract: Secondary | ICD-10-CM | POA: Diagnosis not present

## 2020-04-06 DIAGNOSIS — C792 Secondary malignant neoplasm of skin: Secondary | ICD-10-CM | POA: Diagnosis not present

## 2020-04-06 DIAGNOSIS — Z79899 Other long term (current) drug therapy: Secondary | ICD-10-CM | POA: Diagnosis not present

## 2020-04-06 DIAGNOSIS — Z79891 Long term (current) use of opiate analgesic: Secondary | ICD-10-CM | POA: Diagnosis not present

## 2020-04-06 DIAGNOSIS — Z510 Encounter for antineoplastic radiation therapy: Secondary | ICD-10-CM | POA: Diagnosis not present

## 2020-04-06 DIAGNOSIS — Z944 Liver transplant status: Secondary | ICD-10-CM | POA: Diagnosis not present

## 2020-04-07 ENCOUNTER — Encounter: Payer: Self-pay | Admitting: Physician Assistant

## 2020-04-07 ENCOUNTER — Other Ambulatory Visit: Payer: Self-pay

## 2020-04-07 ENCOUNTER — Ambulatory Visit (INDEPENDENT_AMBULATORY_CARE_PROVIDER_SITE_OTHER): Payer: Medicare Other | Admitting: Physician Assistant

## 2020-04-07 DIAGNOSIS — L578 Other skin changes due to chronic exposure to nonionizing radiation: Secondary | ICD-10-CM | POA: Diagnosis not present

## 2020-04-07 DIAGNOSIS — Z85828 Personal history of other malignant neoplasm of skin: Secondary | ICD-10-CM | POA: Diagnosis not present

## 2020-04-07 DIAGNOSIS — C44729 Squamous cell carcinoma of skin of left lower limb, including hip: Secondary | ICD-10-CM | POA: Diagnosis not present

## 2020-04-07 DIAGNOSIS — D0472 Carcinoma in situ of skin of left lower limb, including hip: Secondary | ICD-10-CM | POA: Diagnosis not present

## 2020-04-07 NOTE — Progress Notes (Signed)
Follow-Up Visit   Subjective  Cassidy Barber is a 69 y.o. female who presents for the following: No chief complaint on file..  The following portions of the chart were reviewed this encounter and updated as appropriate: Tobacco  Allergies  Meds  Problems  Med Hx  Surg Hx  Fam Hx      Objective  Well appearing patient in no apparent distress; mood and affect are within normal limits.  A full examination was performed including scalp, head, eyes, ears, nose, lips, neck, chest, axillae, abdomen, back, buttocks, bilateral upper extremities, bilateral lower extremities, hands, feet, fingers, toes, fingernails, and toenails. All findings within normal limits unless otherwise noted below.  Objective  Left Foot - dorsal: Hyperkeratotic scale with pink base  1.0 cm to brown spot in photo Size 1.5 cm     Objective  Left top leg: Volcano growth on pink base  Size 1.5 cm     Assessment & Plan  Actinic skin damage Head - Anterior (Face)  Carcinoma in situ of skin of left lower extremity including hip Left Foot - dorsal  Epidermal / dermal shaving  Informed consent: discussed and consent obtained   Timeout: patient name, date of birth, surgical site, and procedure verified   Procedure prep:  Patient was prepped and draped in usual sterile fashion Prep type:  Chlorhexidine Anesthesia: the lesion was anesthetized in a standard fashion   Anesthetic:  1% lidocaine w/ epinephrine 1-100,000 local infiltration Instrument used: flexible razor blade   Outcome: patient tolerated procedure well   Post-procedure details: wound care instructions given    Destruction of lesion Complexity: simple   Destruction method: electrodesiccation and curettage   Informed consent: discussed and consent obtained   Timeout:  patient name, date of birth, surgical site, and procedure verified Anesthesia: the lesion was anesthetized in a standard fashion   Anesthetic:  1% lidocaine w/ epinephrine  1-100,000 local infiltration Curettage performed in three different directions: Yes   Electrodesiccation performed over the curetted area: Yes   Curettage cycles:  3 Lesion length (cm):  1.5 Lesion width (cm):  1.5 Margin per side (cm):  0.1 Final wound size (cm):  1.7 Hemostasis achieved with:  aluminum chloride Outcome: patient tolerated procedure well with no complications   Post-procedure details: wound care instructions given    Specimen 2 - Surgical pathology Differential Diagnosis:scc Check Margins: No Treatment curet after cautery  History of SCC (squamous cell carcinoma) of skin Left Breast  History of basal cell carcinoma (BCC) Right Breast  SCC (squamous cell carcinoma), leg, left Left top leg  Epidermal / dermal shaving  Lesion length (cm):  1.5 Lesion width (cm):  1.5 Margin per side (cm):  0.1 Total excision diameter (cm):  1.7 Informed consent: discussed and consent obtained   Timeout: patient name, date of birth, surgical site, and procedure verified   Procedure prep:  Patient was prepped and draped in usual sterile fashion Prep type:  Chlorhexidine Anesthesia: the lesion was anesthetized in a standard fashion   Anesthetic:  1% lidocaine w/ epinephrine 1-100,000 local infiltration Instrument used: DermaBlade   Hemostasis achieved with: aluminum chloride   Outcome: patient tolerated procedure well   Post-procedure details: sterile dressing applied and wound care instructions given   Dressing type: petrolatum gauze, petrolatum and bandage    Destruction of lesion Complexity: simple   Destruction method: electrodesiccation and curettage   Informed consent: discussed and consent obtained   Timeout:  patient name, date of birth, surgical  site, and procedure verified Anesthesia: the lesion was anesthetized in a standard fashion   Anesthetic:  1% lidocaine w/ epinephrine 1-100,000 local infiltration Curettage performed in three different directions: Yes     Electrodesiccation performed over the curetted area: Yes   Curettage cycles:  3 Lesion length (cm):  1.5 Lesion width (cm):  1.5 Margin per side (cm):  0.1 Final wound size (cm):  1.7 Hemostasis achieved with:  aluminum chloride Outcome: patient tolerated procedure well with no complications   Post-procedure details: wound care instructions given    Specimen 1 - Surgical pathology Differential Diagnosis: SCC Check Margins: No [id number] Treatment after biopsy curet and cautery

## 2020-04-08 ENCOUNTER — Ambulatory Visit: Payer: Medicare Other | Admitting: Physician Assistant

## 2020-04-08 DIAGNOSIS — Z886 Allergy status to analgesic agent status: Secondary | ICD-10-CM | POA: Diagnosis not present

## 2020-04-08 DIAGNOSIS — K219 Gastro-esophageal reflux disease without esophagitis: Secondary | ICD-10-CM | POA: Diagnosis not present

## 2020-04-08 DIAGNOSIS — Z96612 Presence of left artificial shoulder joint: Secondary | ICD-10-CM | POA: Diagnosis not present

## 2020-04-08 DIAGNOSIS — Z9049 Acquired absence of other specified parts of digestive tract: Secondary | ICD-10-CM | POA: Diagnosis not present

## 2020-04-08 DIAGNOSIS — Z471 Aftercare following joint replacement surgery: Secondary | ICD-10-CM | POA: Diagnosis not present

## 2020-04-08 DIAGNOSIS — Z944 Liver transplant status: Secondary | ICD-10-CM | POA: Diagnosis not present

## 2020-04-08 DIAGNOSIS — Z79899 Other long term (current) drug therapy: Secondary | ICD-10-CM | POA: Diagnosis not present

## 2020-04-08 DIAGNOSIS — C792 Secondary malignant neoplasm of skin: Secondary | ICD-10-CM | POA: Diagnosis not present

## 2020-04-08 DIAGNOSIS — Z79891 Long term (current) use of opiate analgesic: Secondary | ICD-10-CM | POA: Diagnosis not present

## 2020-04-08 DIAGNOSIS — Z510 Encounter for antineoplastic radiation therapy: Secondary | ICD-10-CM | POA: Diagnosis not present

## 2020-04-08 DIAGNOSIS — C44329 Squamous cell carcinoma of skin of other parts of face: Secondary | ICD-10-CM | POA: Diagnosis not present

## 2020-04-08 DIAGNOSIS — Z7982 Long term (current) use of aspirin: Secondary | ICD-10-CM | POA: Diagnosis not present

## 2020-04-12 DIAGNOSIS — Z79899 Other long term (current) drug therapy: Secondary | ICD-10-CM | POA: Diagnosis not present

## 2020-04-12 DIAGNOSIS — Z944 Liver transplant status: Secondary | ICD-10-CM | POA: Diagnosis not present

## 2020-04-12 DIAGNOSIS — C44329 Squamous cell carcinoma of skin of other parts of face: Secondary | ICD-10-CM | POA: Diagnosis not present

## 2020-04-12 DIAGNOSIS — Z881 Allergy status to other antibiotic agents status: Secondary | ICD-10-CM | POA: Diagnosis not present

## 2020-04-12 DIAGNOSIS — Z510 Encounter for antineoplastic radiation therapy: Secondary | ICD-10-CM | POA: Diagnosis not present

## 2020-04-12 DIAGNOSIS — Z885 Allergy status to narcotic agent status: Secondary | ICD-10-CM | POA: Diagnosis not present

## 2020-04-12 DIAGNOSIS — Z85038 Personal history of other malignant neoplasm of large intestine: Secondary | ICD-10-CM | POA: Diagnosis not present

## 2020-04-12 DIAGNOSIS — Z79891 Long term (current) use of opiate analgesic: Secondary | ICD-10-CM | POA: Diagnosis not present

## 2020-04-13 DIAGNOSIS — Z85038 Personal history of other malignant neoplasm of large intestine: Secondary | ICD-10-CM | POA: Diagnosis not present

## 2020-04-13 DIAGNOSIS — Z79899 Other long term (current) drug therapy: Secondary | ICD-10-CM | POA: Diagnosis not present

## 2020-04-13 DIAGNOSIS — C44329 Squamous cell carcinoma of skin of other parts of face: Secondary | ICD-10-CM | POA: Diagnosis not present

## 2020-04-13 DIAGNOSIS — Z881 Allergy status to other antibiotic agents status: Secondary | ICD-10-CM | POA: Diagnosis not present

## 2020-04-13 DIAGNOSIS — Z510 Encounter for antineoplastic radiation therapy: Secondary | ICD-10-CM | POA: Diagnosis not present

## 2020-04-13 DIAGNOSIS — Z944 Liver transplant status: Secondary | ICD-10-CM | POA: Diagnosis not present

## 2020-04-13 DIAGNOSIS — Z79891 Long term (current) use of opiate analgesic: Secondary | ICD-10-CM | POA: Diagnosis not present

## 2020-04-13 DIAGNOSIS — Z885 Allergy status to narcotic agent status: Secondary | ICD-10-CM | POA: Diagnosis not present

## 2020-04-14 DIAGNOSIS — Z944 Liver transplant status: Secondary | ICD-10-CM | POA: Diagnosis not present

## 2020-04-14 DIAGNOSIS — Z79899 Other long term (current) drug therapy: Secondary | ICD-10-CM | POA: Diagnosis not present

## 2020-04-14 DIAGNOSIS — Z85038 Personal history of other malignant neoplasm of large intestine: Secondary | ICD-10-CM | POA: Diagnosis not present

## 2020-04-14 DIAGNOSIS — Z510 Encounter for antineoplastic radiation therapy: Secondary | ICD-10-CM | POA: Diagnosis not present

## 2020-04-14 DIAGNOSIS — Z881 Allergy status to other antibiotic agents status: Secondary | ICD-10-CM | POA: Diagnosis not present

## 2020-04-14 DIAGNOSIS — Z885 Allergy status to narcotic agent status: Secondary | ICD-10-CM | POA: Diagnosis not present

## 2020-04-14 DIAGNOSIS — Z79891 Long term (current) use of opiate analgesic: Secondary | ICD-10-CM | POA: Diagnosis not present

## 2020-04-14 DIAGNOSIS — C44329 Squamous cell carcinoma of skin of other parts of face: Secondary | ICD-10-CM | POA: Diagnosis not present

## 2020-04-15 DIAGNOSIS — Z85038 Personal history of other malignant neoplasm of large intestine: Secondary | ICD-10-CM | POA: Diagnosis not present

## 2020-04-15 DIAGNOSIS — Z79899 Other long term (current) drug therapy: Secondary | ICD-10-CM | POA: Diagnosis not present

## 2020-04-15 DIAGNOSIS — Z79891 Long term (current) use of opiate analgesic: Secondary | ICD-10-CM | POA: Diagnosis not present

## 2020-04-15 DIAGNOSIS — Z944 Liver transplant status: Secondary | ICD-10-CM | POA: Diagnosis not present

## 2020-04-15 DIAGNOSIS — Z885 Allergy status to narcotic agent status: Secondary | ICD-10-CM | POA: Diagnosis not present

## 2020-04-15 DIAGNOSIS — Z881 Allergy status to other antibiotic agents status: Secondary | ICD-10-CM | POA: Diagnosis not present

## 2020-04-15 DIAGNOSIS — C44329 Squamous cell carcinoma of skin of other parts of face: Secondary | ICD-10-CM | POA: Diagnosis not present

## 2020-04-15 DIAGNOSIS — Z510 Encounter for antineoplastic radiation therapy: Secondary | ICD-10-CM | POA: Diagnosis not present

## 2020-04-18 DIAGNOSIS — Z85038 Personal history of other malignant neoplasm of large intestine: Secondary | ICD-10-CM | POA: Diagnosis not present

## 2020-04-18 DIAGNOSIS — Z885 Allergy status to narcotic agent status: Secondary | ICD-10-CM | POA: Diagnosis not present

## 2020-04-18 DIAGNOSIS — Z881 Allergy status to other antibiotic agents status: Secondary | ICD-10-CM | POA: Diagnosis not present

## 2020-04-18 DIAGNOSIS — C44329 Squamous cell carcinoma of skin of other parts of face: Secondary | ICD-10-CM | POA: Diagnosis not present

## 2020-04-18 DIAGNOSIS — Z79899 Other long term (current) drug therapy: Secondary | ICD-10-CM | POA: Diagnosis not present

## 2020-04-18 DIAGNOSIS — Z510 Encounter for antineoplastic radiation therapy: Secondary | ICD-10-CM | POA: Diagnosis not present

## 2020-04-18 DIAGNOSIS — Z944 Liver transplant status: Secondary | ICD-10-CM | POA: Diagnosis not present

## 2020-04-18 DIAGNOSIS — Z79891 Long term (current) use of opiate analgesic: Secondary | ICD-10-CM | POA: Diagnosis not present

## 2020-04-20 DIAGNOSIS — Z885 Allergy status to narcotic agent status: Secondary | ICD-10-CM | POA: Diagnosis not present

## 2020-04-20 DIAGNOSIS — Z881 Allergy status to other antibiotic agents status: Secondary | ICD-10-CM | POA: Diagnosis not present

## 2020-04-20 DIAGNOSIS — C44329 Squamous cell carcinoma of skin of other parts of face: Secondary | ICD-10-CM | POA: Diagnosis not present

## 2020-04-20 DIAGNOSIS — Z510 Encounter for antineoplastic radiation therapy: Secondary | ICD-10-CM | POA: Diagnosis not present

## 2020-04-20 DIAGNOSIS — Z79891 Long term (current) use of opiate analgesic: Secondary | ICD-10-CM | POA: Diagnosis not present

## 2020-04-20 DIAGNOSIS — Z79899 Other long term (current) drug therapy: Secondary | ICD-10-CM | POA: Diagnosis not present

## 2020-04-20 DIAGNOSIS — Z944 Liver transplant status: Secondary | ICD-10-CM | POA: Diagnosis not present

## 2020-04-20 DIAGNOSIS — Z85038 Personal history of other malignant neoplasm of large intestine: Secondary | ICD-10-CM | POA: Diagnosis not present

## 2020-04-21 DIAGNOSIS — Z79891 Long term (current) use of opiate analgesic: Secondary | ICD-10-CM | POA: Diagnosis not present

## 2020-04-21 DIAGNOSIS — Z944 Liver transplant status: Secondary | ICD-10-CM | POA: Diagnosis not present

## 2020-04-21 DIAGNOSIS — Z510 Encounter for antineoplastic radiation therapy: Secondary | ICD-10-CM | POA: Diagnosis not present

## 2020-04-21 DIAGNOSIS — Z881 Allergy status to other antibiotic agents status: Secondary | ICD-10-CM | POA: Diagnosis not present

## 2020-04-21 DIAGNOSIS — Z79899 Other long term (current) drug therapy: Secondary | ICD-10-CM | POA: Diagnosis not present

## 2020-04-21 DIAGNOSIS — C44329 Squamous cell carcinoma of skin of other parts of face: Secondary | ICD-10-CM | POA: Diagnosis not present

## 2020-04-21 DIAGNOSIS — Z85038 Personal history of other malignant neoplasm of large intestine: Secondary | ICD-10-CM | POA: Diagnosis not present

## 2020-04-21 DIAGNOSIS — Z885 Allergy status to narcotic agent status: Secondary | ICD-10-CM | POA: Diagnosis not present

## 2020-04-28 ENCOUNTER — Other Ambulatory Visit: Payer: Self-pay

## 2020-04-28 ENCOUNTER — Encounter: Payer: Self-pay | Admitting: Physician Assistant

## 2020-04-28 ENCOUNTER — Ambulatory Visit: Payer: Medicare Other | Admitting: Physician Assistant

## 2020-04-28 DIAGNOSIS — C44529 Squamous cell carcinoma of skin of other part of trunk: Secondary | ICD-10-CM

## 2020-04-28 DIAGNOSIS — C44622 Squamous cell carcinoma of skin of right upper limb, including shoulder: Secondary | ICD-10-CM | POA: Diagnosis not present

## 2020-04-28 DIAGNOSIS — C4492 Squamous cell carcinoma of skin, unspecified: Secondary | ICD-10-CM

## 2020-04-28 DIAGNOSIS — C44729 Squamous cell carcinoma of skin of left lower limb, including hip: Secondary | ICD-10-CM | POA: Diagnosis not present

## 2020-04-28 NOTE — Patient Instructions (Signed)

## 2020-04-28 NOTE — Progress Notes (Signed)
Follow-Up Visit   Subjective  Cassidy Barber is a 69 y.o. female who presents for the following: Follow-up (left thigh & chest).   The following portions of the chart were reviewed this encounter and updated as appropriate:     Objective  Well appearing patient in no apparent distress; mood and affect are within normal limits.  A full examination was performed including scalp, head, eyes, ears, nose, lips, neck, chest, axillae, abdomen, back, buttocks, bilateral upper extremities, bilateral lower extremities, hands, feet, fingers, toes, fingernails, and toenails. All findings within normal limits unless otherwise noted below.  Objective  Chest - Medial [facility]): Hyperkeratotic scale with pink base Volcano growth on pink base 2 cm to line scar      Objective  Left upper chest: Hyperkeratotic scale with pink base  3.3 to scar inferior line like     Objective  Left Lower Leg - lateral: Hyperkeratotic scale with pink base  10.5 to superior scar     Objective  Right Forearm - Posterior: Hyperkeratotic scale with pink base  3.0 to scar line on right side of arm     Assessment & Plan  SCCA (squamous cell carcinoma) of skin (4) Chest - Medial (Center)  Skin / nail biopsy Type of biopsy: tangential   Informed consent: discussed and consent obtained   Timeout: patient name, date of birth, surgical site, and procedure verified   Anesthesia: the lesion was anesthetized in a standard fashion   Anesthetic:  1% lidocaine w/ epinephrine 1-100,000 local infiltration Instrument used: flexible razor blade   Hemostasis achieved with: aluminum chloride and electrodesiccation   Outcome: patient tolerated procedure well   Post-procedure details: wound care instructions given    Destruction of lesion Complexity: simple   Destruction method: electrodesiccation and curettage   Informed consent: discussed and consent obtained   Timeout:  patient name, date of birth, surgical  site, and procedure verified Anesthesia: the lesion was anesthetized in a standard fashion   Anesthetic:  1% lidocaine w/ epinephrine 1-100,000 local infiltration Curettage performed in three different directions: Yes   Curettage cycles:  3 Lesion length (cm):  1.5 Lesion width (cm):  1.5 Margin per side (cm):  0.1 Final wound size (cm):  1.7 Hemostasis achieved with:  aluminum chloride Outcome: patient tolerated procedure well with no complications   Post-procedure details: wound care instructions given    Specimen 1 - Surgical pathology Differential Diagnosis: scc Check Margins: No  Left upper chest  Skin / nail biopsy  Informed consent: discussed and consent obtained   Timeout: patient name, date of birth, surgical site, and procedure verified   Anesthesia: the lesion was anesthetized in a standard fashion   Anesthetic:  1% lidocaine w/ epinephrine 1-100,000 local infiltration Hemostasis achieved with: aluminum chloride and electrodesiccation   Outcome: patient tolerated procedure well   Post-procedure details: wound care instructions given    Destruction of lesion Complexity: simple   Destruction method: electrodesiccation and curettage   Informed consent: discussed and consent obtained   Timeout:  patient name, date of birth, surgical site, and procedure verified Anesthesia: the lesion was anesthetized in a standard fashion   Anesthetic:  1% lidocaine w/ epinephrine 1-100,000 local infiltration Curettage performed in three different directions: Yes   Electrodesiccation performed over the curetted area: Yes   Curettage cycles:  3 Lesion length (cm):  1.3 Lesion width (cm):  1.3 Margin per side (cm):  0.1 Final wound size (cm):  1.5 Hemostasis achieved with:  aluminum  chloride Outcome: patient tolerated procedure well with no complications   Post-procedure details: wound care instructions given    Specimen 2 - Surgical pathology Differential Diagnosis: KA Check  Margins: No  Left Lower Leg - lateral  Skin / nail biopsy Type of biopsy: tangential   Informed consent: discussed and consent obtained   Timeout: patient name, date of birth, surgical site, and procedure verified   Anesthesia: the lesion was anesthetized in a standard fashion   Anesthetic:  1% lidocaine w/ epinephrine 1-100,000 local infiltration Instrument used: flexible razor blade   Hemostasis achieved with: aluminum chloride and electrodesiccation   Outcome: patient tolerated procedure well   Post-procedure details: wound care instructions given    Destruction of lesion Complexity: simple   Destruction method: electrodesiccation and curettage   Informed consent: discussed and consent obtained   Timeout:  patient name, date of birth, surgical site, and procedure verified Anesthesia: the lesion was anesthetized in a standard fashion   Anesthetic:  1% lidocaine w/ epinephrine 1-100,000 local infiltration Curettage performed in three different directions: Yes   Electrodesiccation performed over the curetted area: Yes   Curettage cycles:  3 Lesion length (cm):  2.5 Lesion width (cm):  2.5 Margin per side (cm):  0.1 Final wound size (cm):  2.7 Hemostasis achieved with:  aluminum chloride Outcome: patient tolerated procedure well with no complications   Post-procedure details: wound care instructions given    Specimen 3 - Surgical pathology Differential Diagnosis: scc Check Margins: No  Right Forearm - Posterior  Skin / nail biopsy Type of biopsy: tangential   Informed consent: discussed and consent obtained   Timeout: patient name, date of birth, surgical site, and procedure verified   Anesthesia: the lesion was anesthetized in a standard fashion   Anesthetic:  1% lidocaine w/ epinephrine 1-100,000 local infiltration Instrument used: flexible razor blade   Hemostasis achieved with: aluminum chloride and electrodesiccation   Outcome: patient tolerated procedure well     Post-procedure details: wound care instructions given    Destruction of lesion Complexity: simple   Destruction method: electrodesiccation and curettage   Informed consent: discussed and consent obtained   Timeout:  patient name, date of birth, surgical site, and procedure verified Anesthesia: the lesion was anesthetized in a standard fashion   Anesthetic:  1% lidocaine w/ epinephrine 1-100,000 local infiltration Curettage performed in three different directions: Yes   Electrodesiccation performed over the curetted area: Yes   Curettage cycles:  3 Lesion length (cm):  1.1 Lesion width (cm):  1.1 Margin per side (cm):  0.1 Final wound size (cm):  1.3 Hemostasis achieved with:  aluminum chloride Outcome: patient tolerated procedure well with no complications   Post-procedure details: wound care instructions given    Specimen 4 - Surgical pathology Differential Diagnosis: scc Check Margins: No

## 2020-04-29 ENCOUNTER — Other Ambulatory Visit: Payer: Self-pay | Admitting: Family

## 2020-04-29 DIAGNOSIS — I10 Essential (primary) hypertension: Secondary | ICD-10-CM

## 2020-05-02 DIAGNOSIS — Z961 Presence of intraocular lens: Secondary | ICD-10-CM | POA: Diagnosis not present

## 2020-05-02 DIAGNOSIS — H26491 Other secondary cataract, right eye: Secondary | ICD-10-CM | POA: Diagnosis not present

## 2020-05-02 DIAGNOSIS — H43813 Vitreous degeneration, bilateral: Secondary | ICD-10-CM | POA: Diagnosis not present

## 2020-05-03 ENCOUNTER — Ambulatory Visit (INDEPENDENT_AMBULATORY_CARE_PROVIDER_SITE_OTHER): Payer: Medicare Other | Admitting: Internal Medicine

## 2020-05-03 ENCOUNTER — Other Ambulatory Visit: Payer: Self-pay

## 2020-05-03 ENCOUNTER — Encounter (INDEPENDENT_AMBULATORY_CARE_PROVIDER_SITE_OTHER): Payer: Self-pay | Admitting: Internal Medicine

## 2020-05-03 VITALS — BP 138/77 | HR 72 | Temp 98.0°F | Resp 18 | Ht 64.0 in | Wt 142.6 lb

## 2020-05-03 DIAGNOSIS — Z944 Liver transplant status: Secondary | ICD-10-CM

## 2020-05-03 DIAGNOSIS — K219 Gastro-esophageal reflux disease without esophagitis: Secondary | ICD-10-CM | POA: Diagnosis not present

## 2020-05-03 DIAGNOSIS — C189 Malignant neoplasm of colon, unspecified: Secondary | ICD-10-CM | POA: Diagnosis not present

## 2020-05-03 NOTE — Progress Notes (Signed)
Presenting complaint;  Chronic diarrhea and GERD.  History of iron deficiency anemia. History of colon carcinoma.  Patient only has rectum and distal sigmoid colon in situ.  Database and subjective:  Patient is 69 year old Caucasian female with complicated GI history who is here for scheduled visit.  She underwent LDLT at [facility] in January 2015(her son was donor) for primary biliary cirrhosis.  She has required biliary stenting for anastomotic stricture.  She just had stent removed about 6 months ago at [facility].  She is being followed by Dr. Laurier Nancy. She also has history of colon carcinoma and carries gene for Lynch syndrome.  She is status post subtotal colectomy.  She has been undergoing yearly surveillance flexible sigmoidoscopy and last exam was in February this year.  She also has a history of iron deficiency anemia possibly due to impaired absorption and has received parenteral iron.  She says she is doing well from GI standpoint.  On most days she has 3-4 bowel movements.  Stool consistency varies.  She denies melena or rectal bleeding.  She says famotidine is working.  She denies dysphagia. She says her appetite is very good.  She has lost 7 pounds since her last visit.  She is hoping to lose some more.  She had left shoulder replacement in September 2019 and it had to be redone in September 2020.  She is not having any pain.  She just has to be careful not to cause dislocation again. Patient says she had cancer removed from her scalp.  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  She received radiation therapy for 30 days.  She says area is finally healing.  She has had multiple other lesions removed from her extremities.  Current Medications: Outpatient Encounter Medications as of 05/03/2020  Medication Sig  . acetaminophen (TYLENOL) 500 MG tablet Take 1,000 mg by mouth every 6 (six) hours as needed (for pain.).   [name] alendronate (FOSAMAX) 70 MG tablet Take 1 tablet (70 mg total)  by mouth every Friday. Take with a full glass of water on an empty stomach.  . ALPRAZolam (XANAX) 0.5 MG tablet Take 1 tablet (0.5 mg total) by mouth at bedtime.  [name] aspirin EC 81 MG tablet Take 81 mg by mouth daily.  . Calcium Citrate-Vitamin D (CALCIUM CITRATE + D3 PO) Take 1 tablet by mouth 2 (two) times daily.  . cetirizine (ZYRTEC) 10 MG tablet TAKE 1 TO 2 TABLETS TWICE DAILY (Patient taking differently: Take 20 mg by mouth daily. )  . cyclobenzaprine (FLEXERIL) 5 MG tablet Take 1 tablet (5 mg total) by mouth 3 (three) times daily as needed for muscle spasms.  . famotidine (PEPCID) 20 MG tablet Take 1 tablet (20 mg total) by mouth 2 (two) times daily.  . fluorouracil (EFUDEX) 5 % cream Apply 1 application topically daily as needed (cancer spots).   . hydrOXYzine (ATARAX/VISTARIL) 25 MG tablet TAKE 1 TABLET THREE TIMES DAILY AS NEEDED FOR ITCHING (Patient taking differently: Take 25 mg by mouth 3 (three) times daily as needed for itching. )  . levothyroxine (SYNTHROID) 125 MCG tablet Take 1 tablet (125 mcg total) by mouth daily before breakfast.  . losartan (COZAAR) 25 MG tablet TAKE ONE (1) TABLET EACH DAY  . metoprolol succinate (TOPROL XL) 25 MG 24 hr tablet Take 1 tablet (25 mg total) by mouth daily.  . Omega-3 Fatty Acids (FISH OIL) 1000 MG CAPS Take by mouth.  . ondansetron (ZOFRAN) 4 MG tablet Take  1 tablet (4 mg total) by mouth every 8 (eight) hours as needed for nausea or vomiting.  . pantoprazole (PROTONIX) 40 MG tablet Take 1 tablet (40 mg total) by mouth 2 (two) times daily. (Patient taking differently: Take 40 mg by mouth daily. )  . SSD 1 % cream   . ursodiol (ACTIGALL) 250 MG tablet Take 250 mg by mouth 2 (two) times daily.   . Vitamin D, Ergocalciferol, (DRISDOL) 1.25 MG (50000 UNIT) CAPS capsule TAKE 1 CAPSULE EVERY WEEK (Patient taking differently: Take 50,000 Units by mouth every Thursday. )  . vitamin E 180 MG (400 UNITS) capsule Take 400 Units by mouth daily.  [name] ZORTRESS  0.5 MG TABS Take 2 mg by mouth 2 (two) times daily.   . Cyanocobalamin (VITAMIN B-12) 5000 MCG TBDP Take 5,000 mcg by mouth every Monday, Wednesday, and Friday.   [name] EPINEPHrine 0.3 mg/0.3 mL IJ SOAJ injection Use as directed for severe allergic reaction (Patient not taking: Reported on 05/03/2020)  . [DISCONTINUED] Biotin 10000 MCG TABS Take 10,000 mcg by mouth daily.   No facility-administered encounter medications on file as of 05/03/2020.     Objective: Blood pressure 138/77, pulse 72, temperature 98 F (36.7 C), temperature source Oral, resp. rate 18, height _0  (1.626 m), weight 142 lb 9.6 oz (64.7 kg). Patient is alert and in no acute distress. She does not have asterixis. She is wearing a mask. Conjunctiva is pink. Sclera is nonicteric Oropharyngeal mucosa is normal. No neck masses or thyromegaly noted. Cardiac exam with regular rhythm normal S1 and S2. No murmur or gallop noted. Lungs are clear to auscultation. Abdomen abdominal wall is flabby.  She has long lower midline scar as well as scar in right side of abdomen extending into her flank.  On palpation soft and nontender without organomegaly or masses. No LE edema or clubbing noted.  Labs/studies Results:   CBC Latest Ref Rng & Units 03/08/2020 12/07/2019 11/11/2019  WBC 4.0 - 10.5 K/uL 7.0 7.5 7.6  Hemoglobin 12.0 - 15.0 g/dL 11.9(L) 11.5(L) 9.8(L)  Hematocrit 36 - 46 % 40.2 38.9 33.8(L)  Platelets 150 - 400 K/uL 365 358 500(H)    CMP Latest Ref Rng & Units 03/08/2020 12/07/2019 11/11/2019  Glucose 70 - 99 mg/dL 102(H) 104(H) 97  BUN 8 - 23 mg/dL 12 24(H) 12  Creatinine 0.44 - 1.00 mg/dL 0.76 0.82 0.60  Sodium 135 - 145 mmol/L 138 136 138  Potassium 3.5 - 5.1 mmol/L 4.0 4.5 4.0  Chloride 98 - 111 mmol/L 100 101 105  CO2 22 - 32 mmol/L _1 Calcium 8.9 - 10.3 mg/dL 9.0 8.9 8.8(L)  Total Protein 6.5 - 8.1 g/dL 7.6 7.2 7.1  Total Bilirubin 0.3 - 1.2 mg/dL 0.4 0.5 0.5  Alkaline Phos 38 - 126 U/L 144(H) 168(H)  199(H)  AST 15 - 41 U/L _2 ALT 0 - 44 U/L _3 Hepatic Function Latest Ref Rng & Units 03/08/2020 12/07/2019 11/11/2019  Total Protein 6.5 - 8.1 g/dL 7.6 7.2 7.1  Albumin 3.5 - 5.0 g/dL 3.6 3.5 3.3(L)  AST 15 - 41 U/L _4 ALT 0 - 44 U/L _5 Alk Phosphatase 38 - 126 U/L 144(H) 168(H) 199(H)  Total Bilirubin 0.3 - 1.2 mg/dL 0.4 0.5 0.5  Bilirubin, Direct 0.00 - 0.40 mg/dL - - -    Lab Results  Component Value Date   CRP <0.8 08/19/2018  Assessment:  #1.  Chronic diarrhea secondary to loss of most of her colon.  She has adapted and now she is only having 3-4 stools per day.  She is not even requiring any antispasmodic or antidiarrheal.  #2.  History of colon carcinoma.  She has Lynch syndrome.  She is up-to-date on surveillance schedule.  Last sigmoidoscopy was in February 2021 and next exam would be in February 2022.  #3.  History of liver transplant secondary to [place].  She is on everolimus and remains with normal transaminases.  She has mildly elevated alkaline phosphatase which actually has decreased since biliary anastomotic stricture was treated with stenting.  Biliary stent removed but 6 months ago.  #4.  GERD.  She is doing well with dietary measures and famotidine.  #5.  History of iron deficiency anemia.  Suspect she has impaired iron absorption.  She has received parenteral iron infusion in the past.  Hemoglobin is near normal.   Plan:  Patient will call office if famotidine stops working or if she has change in her bowel habits. Surveillance flexible sigmoidoscopy in February 2022. Office visit in 1 year.

## 2020-05-03 NOTE — Patient Instructions (Signed)
Flexible sigmoidoscopy in February, 2022.

## 2020-05-05 DIAGNOSIS — Z944 Liver transplant status: Secondary | ICD-10-CM | POA: Diagnosis not present

## 2020-05-05 DIAGNOSIS — D849 Immunodeficiency, unspecified: Secondary | ICD-10-CM | POA: Diagnosis not present

## 2020-05-08 IMAGING — DX DG SACRUM/COCCYX 2+V
3 series · 3 of 3 positions shown · non-contrast
Comparison: CT 10/04/2019.

CLINICAL DATA: Fell to floor and injured bone.

EXAM:
SACRUM AND COCCYX - 2+ VIEW

[coccyx ap]
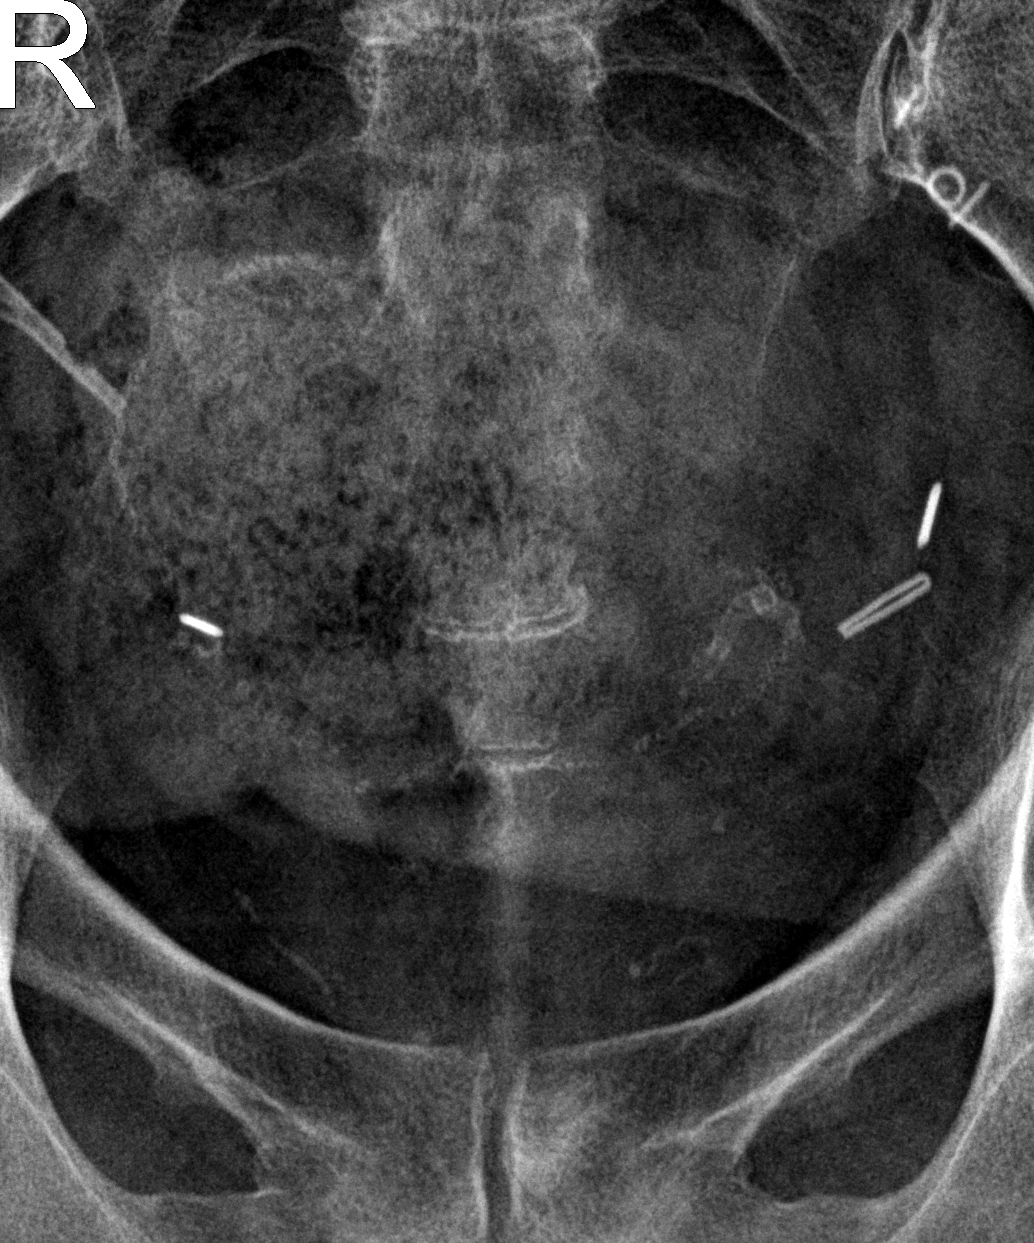

[sacrum ap]
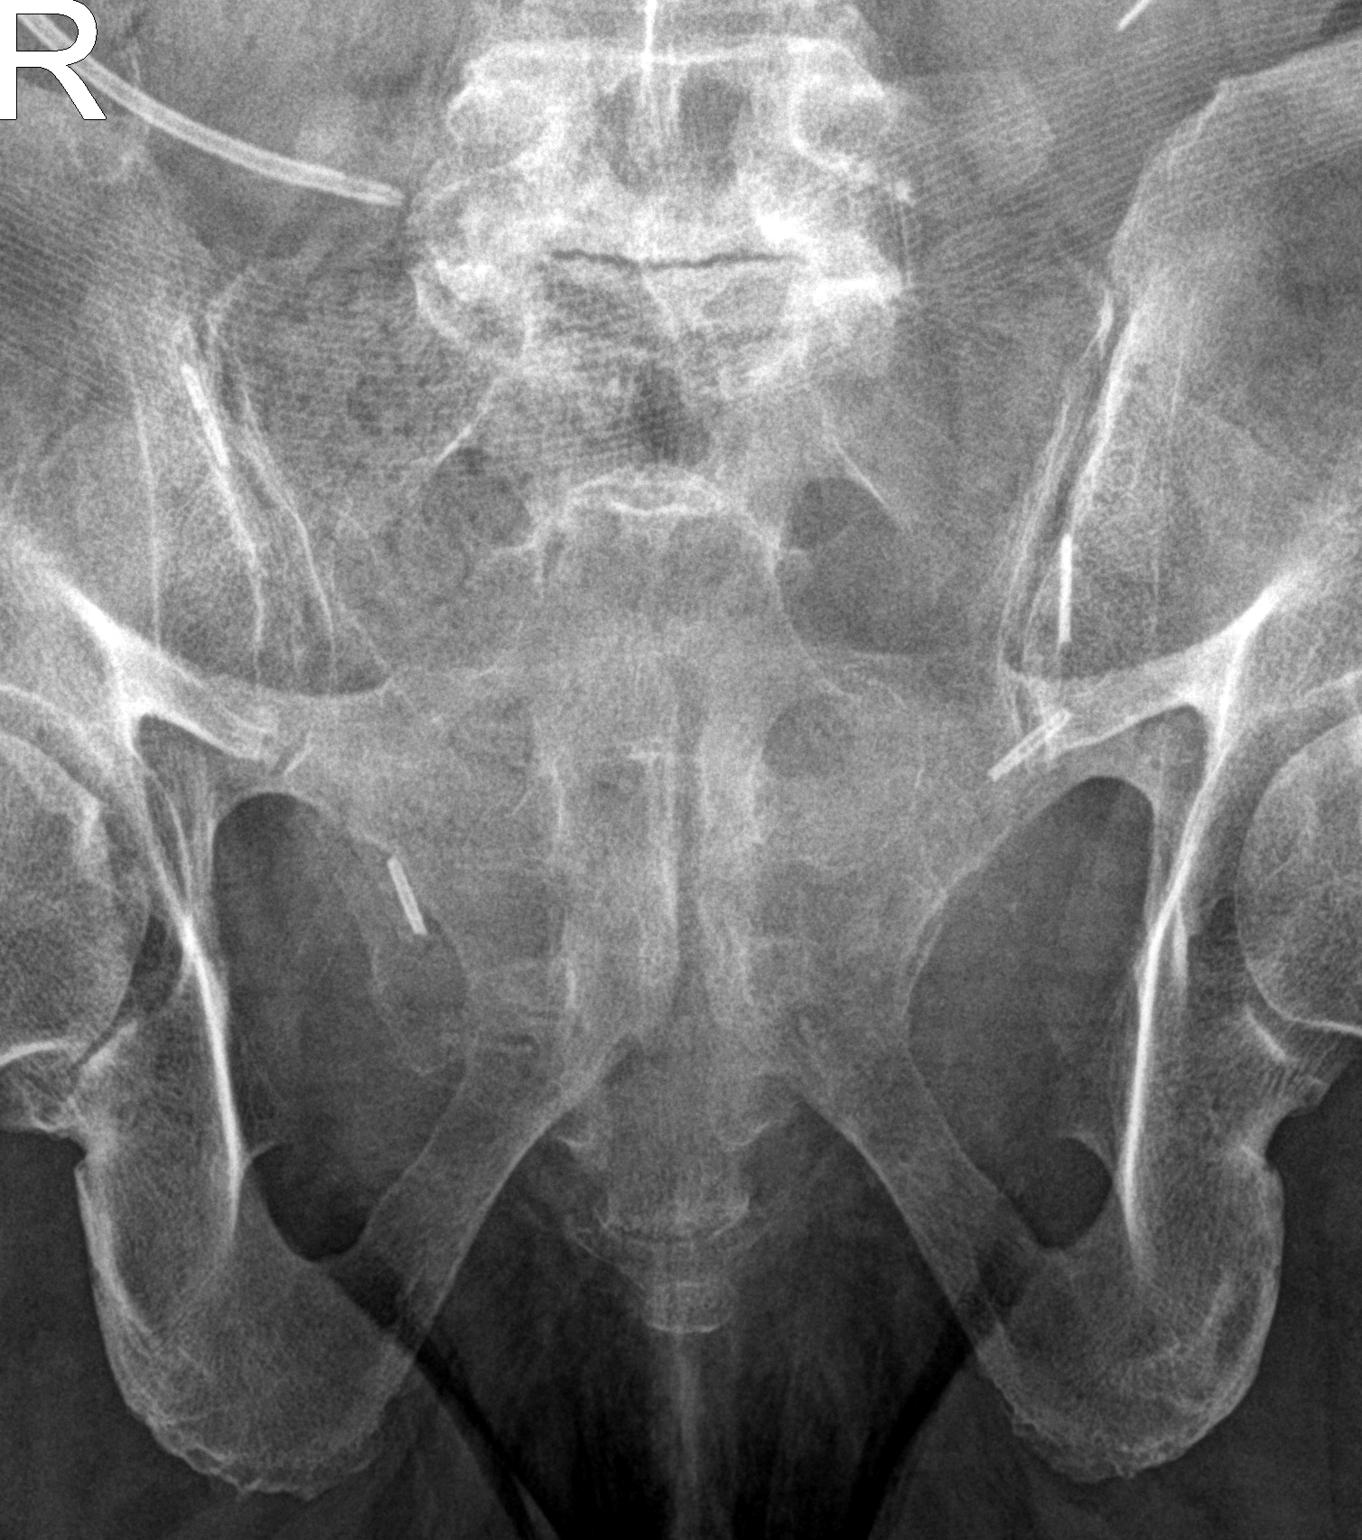

[sacrum lat]
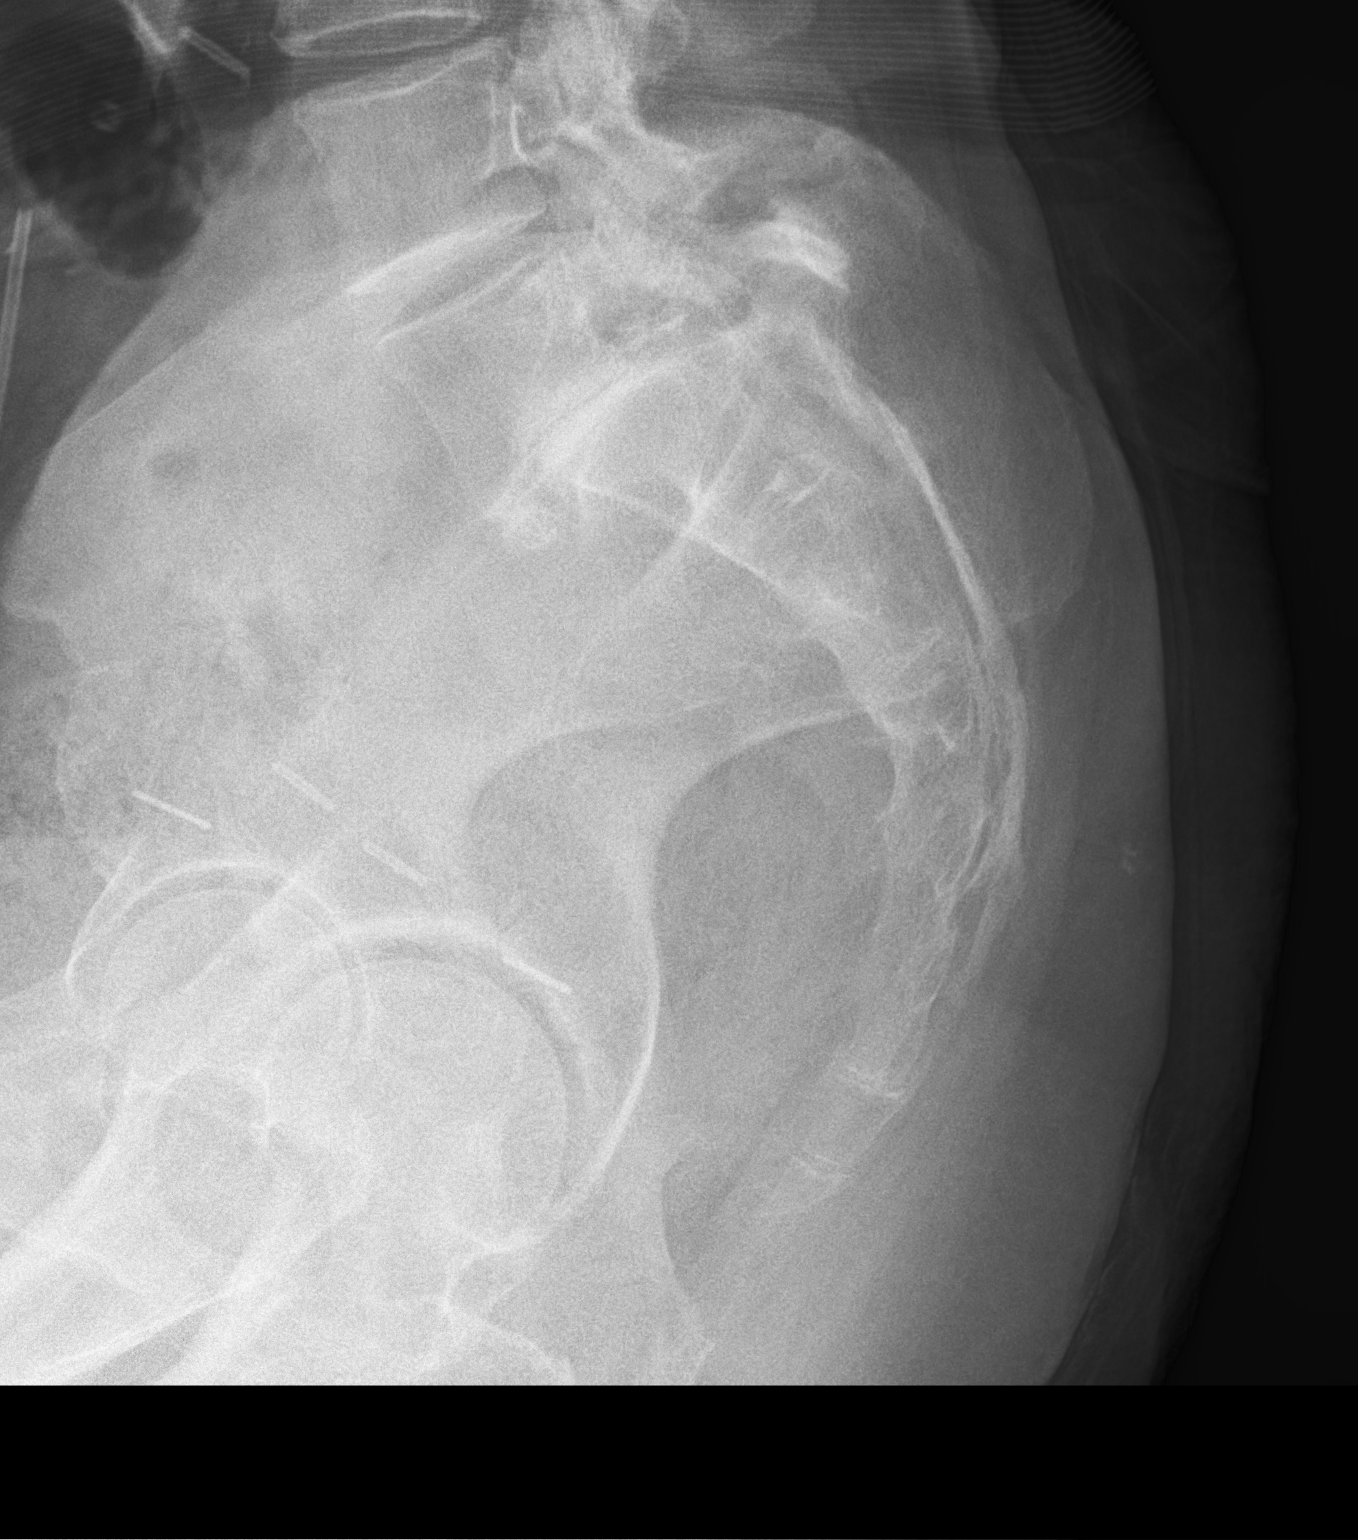

[3 of 3 positions shown; findings below may reference images not displayed]

FINDINGS: Degenerative changes lumbar spine and both SI joints. No evidence of
fracture. 11 mm anterolisthesis L5 on S1. Surgical clips are noted
the pelvis.
IMPRESSION: Degenerative changes lumbar spine and both SI joints. No evidence of
fracture. 11 mm anterolisthesis L5 on S1.

## 2020-05-11 ENCOUNTER — Other Ambulatory Visit: Payer: Medicare Other

## 2020-05-11 ENCOUNTER — Other Ambulatory Visit: Payer: Self-pay

## 2020-05-11 DIAGNOSIS — Z944 Liver transplant status: Secondary | ICD-10-CM | POA: Diagnosis not present

## 2020-05-11 DIAGNOSIS — D849 Immunodeficiency, unspecified: Secondary | ICD-10-CM | POA: Diagnosis not present

## 2020-05-18 ENCOUNTER — Other Ambulatory Visit: Payer: Self-pay

## 2020-05-18 ENCOUNTER — Inpatient Hospital Stay (HOSPITAL_COMMUNITY): Payer: Medicare Other | Attending: Hematology

## 2020-05-18 DIAGNOSIS — Z85038 Personal history of other malignant neoplasm of large intestine: Secondary | ICD-10-CM | POA: Insufficient documentation

## 2020-05-18 DIAGNOSIS — G4700 Insomnia, unspecified: Secondary | ICD-10-CM | POA: Insufficient documentation

## 2020-05-18 DIAGNOSIS — K219 Gastro-esophageal reflux disease without esophagitis: Secondary | ICD-10-CM | POA: Insufficient documentation

## 2020-05-18 DIAGNOSIS — E559 Vitamin D deficiency, unspecified: Secondary | ICD-10-CM | POA: Diagnosis not present

## 2020-05-18 DIAGNOSIS — M8580 Other specified disorders of bone density and structure, unspecified site: Secondary | ICD-10-CM | POA: Diagnosis not present

## 2020-05-18 DIAGNOSIS — Z801 Family history of malignant neoplasm of trachea, bronchus and lung: Secondary | ICD-10-CM | POA: Insufficient documentation

## 2020-05-18 DIAGNOSIS — Z800 Family history of malignant neoplasm of digestive organs: Secondary | ICD-10-CM | POA: Insufficient documentation

## 2020-05-18 DIAGNOSIS — Z85828 Personal history of other malignant neoplasm of skin: Secondary | ICD-10-CM | POA: Insufficient documentation

## 2020-05-18 DIAGNOSIS — Z8042 Family history of malignant neoplasm of prostate: Secondary | ICD-10-CM | POA: Diagnosis not present

## 2020-05-18 DIAGNOSIS — E039 Hypothyroidism, unspecified: Secondary | ICD-10-CM | POA: Diagnosis not present

## 2020-05-18 DIAGNOSIS — Z944 Liver transplant status: Secondary | ICD-10-CM | POA: Insufficient documentation

## 2020-05-18 DIAGNOSIS — D509 Iron deficiency anemia, unspecified: Secondary | ICD-10-CM | POA: Insufficient documentation

## 2020-05-18 DIAGNOSIS — D508 Other iron deficiency anemias: Secondary | ICD-10-CM

## 2020-05-18 DIAGNOSIS — I10 Essential (primary) hypertension: Secondary | ICD-10-CM | POA: Insufficient documentation

## 2020-05-18 LAB — CBC WITH DIFFERENTIAL/PLATELET
Abs Immature Granulocytes: 0.02 10*3/uL (ref 0.00–0.07)
Basophils Absolute: 0.2 10*3/uL — ABNORMAL HIGH (ref 0.0–0.1)
Basophils Relative: 2 %
Eosinophils Absolute: 0.7 10*3/uL — ABNORMAL HIGH (ref 0.0–0.5)
Eosinophils Relative: 9 %
HCT: 40.5 % (ref 36.0–46.0)
Hemoglobin: 12.1 g/dL (ref 12.0–15.0)
Immature Granulocytes: 0 %
Lymphocytes Relative: 26 %
Lymphs Abs: 2 10*3/uL (ref 0.7–4.0)
MCH: 28.1 pg (ref 26.0–34.0)
MCHC: 29.9 g/dL — ABNORMAL LOW (ref 30.0–36.0)
MCV: 94.2 fL (ref 80.0–100.0)
Monocytes Absolute: 1.1 10*3/uL — ABNORMAL HIGH (ref 0.1–1.0)
Monocytes Relative: 14 %
Neutro Abs: 4 10*3/uL (ref 1.7–7.7)
Neutrophils Relative %: 49 %
Platelets: 322 10*3/uL (ref 150–400)
RBC: 4.3 MIL/uL (ref 3.87–5.11)
RDW: 13.2 % (ref 11.5–15.5)
WBC: 7.9 10*3/uL (ref 4.0–10.5)
nRBC: 0 % (ref 0.0–0.2)

## 2020-05-18 LAB — COMPREHENSIVE METABOLIC PANEL
ALT: 16 U/L (ref 0–44)
AST: 23 U/L (ref 15–41)
Albumin: 3.3 g/dL — ABNORMAL LOW (ref 3.5–5.0)
Alkaline Phosphatase: 107 U/L (ref 38–126)
Anion gap: 8 (ref 5–15)
BUN: 21 mg/dL (ref 8–23)
CO2: 27 mmol/L (ref 22–32)
Calcium: 8.7 mg/dL — ABNORMAL LOW (ref 8.9–10.3)
Chloride: 104 mmol/L (ref 98–111)
Creatinine, Ser: 1.05 mg/dL — ABNORMAL HIGH (ref 0.44–1.00)
GFR calc Af Amer: >60 mL/min (ref >60)
GFR calc non Af Amer: 54 mL/min — ABNORMAL LOW (ref >60)
Glucose, Bld: 97 mg/dL (ref 70–99)
Potassium: 4 mmol/L (ref 3.5–5.1)
Sodium: 139 mmol/L (ref 135–145)
Total Bilirubin: 0.4 mg/dL (ref 0.3–1.2)
Total Protein: 6.9 g/dL (ref 6.5–8.1)

## 2020-05-18 LAB — IRON AND TIBC
Iron: 45 ug/dL (ref 28–170)
Saturation Ratios: 16 % (ref 10.4–31.8)
TIBC: 278 ug/dL (ref 250–450)
UIBC: 233 ug/dL

## 2020-05-18 LAB — VITAMIN D 25 HYDROXY (VIT D DEFICIENCY, FRACTURES): Vit D, 25-Hydroxy: 87.5 ng/mL (ref 30–100)

## 2020-05-18 LAB — VITAMIN B12: Vitamin B-12: 1600 pg/mL — ABNORMAL HIGH (ref 180–914)

## 2020-05-18 LAB — FERRITIN: Ferritin: 39 ng/mL (ref 11–307)

## 2020-05-18 LAB — LACTATE DEHYDROGENASE: LDH: 190 U/L (ref 98–192)

## 2020-05-25 ENCOUNTER — Inpatient Hospital Stay (HOSPITAL_BASED_OUTPATIENT_CLINIC_OR_DEPARTMENT_OTHER): Payer: Medicare Other | Admitting: Nurse Practitioner

## 2020-05-25 DIAGNOSIS — D508 Other iron deficiency anemias: Secondary | ICD-10-CM | POA: Diagnosis not present

## 2020-05-25 NOTE — Progress Notes (Signed)
[facility] Follow up:    Cassidy Barber, [place] [address]   DIAGNOSIS: Iron deficiency anemia  CURRENT THERAPY: Intermittent iron infusions  INTERVAL HISTORY: Cassidy Barber 69 y.o. female was called for a telephone visit for iron deficiency anemia.  Patient reports she is doing well since her last visit.  She denies any bright red bleeding per rectum or melena.  She denies any easy bruising or bleeding.  She reports her energy levels are fine. Denies any nausea, vomiting, or diarrhea. Denies any new pains. Had not noticed any recent bleeding such as epistaxis, hematuria or hematochezia. Denies recent chest pain on exertion, shortness of breath on minimal exertion, pre-syncopal episodes, or palpitations. Denies any numbness or tingling in hands or feet. Denies any recent fevers, infections, or recent hospitalizations. Patient reports appetite at 100% and energy level at 75%.  She is eating well maintain her weight at this time.    Patient Active Problem List   Diagnosis Date Noted  . S/P reverse total shoulder arthroplasty, left 11/12/2019  . Anterior dislocation of left shoulder 09/26/2019  . Hx of colonic polyps 09/04/2019  . Chest pain 09/04/2019  . Benzodiazepine dependence ([place]) 04/13/2019  . Controlled substance agreement signed 04/13/2019  . Urticaria 09/08/2018  . S/p reverse total shoulder arthroplasty 07/17/2018  . History of colon cancer 06/12/2018  . History of colonic polyps 06/12/2018  . 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  . Gastroesophageal reflux disease 06/12/2018  . Chronic diarrhea 06/26/2017  . Iron deficiency anemia 06/26/2017  . GAD (generalized anxiety disorder) 08/28/2016  . Vitamin D deficiency 08/28/2016  . Insomnia 08/28/2016  . Convulsions ([place]) 11/30/2015  . Liver transplant recipient [facility]) 11/30/2015  . Seizures ([place]) 09/27/2015  . Essential hypertension, benign 03/10/2015  . Hx of liver transplant ([place])  12/08/2014  . Diarrhea 12/08/2014  . Colon cancer ([place]) 12/08/2014  . Hepatic encephalopathy ([place]) 10/26/2013  . Anemia of chronic disease 10/26/2013  . Personal history of colonic polyps 12/27/2012  . PBC (primary biliary cirrhosis) 03/24/2012  . Hypothyroidism 03/24/2012  . Ventral hernia, recurrent 03/24/2012    is allergic to ciprofloxacin and codeine.  MEDICAL HISTORY: Past Medical History:  Diagnosis Date  . Abdominal wall hernia    Incarcerated status post surgical repair 2019 - [facility]  . Anemia of chronic disease   . Atypical nevus 01/21/2018   atypical neoplasm- Left scalp-ant (txpbx + MOHS), atypical neoplasm- Left scalp post- (txpbx + MOHS)  . Colon cancer [facility])    Colon surgery 2005 and 2012  . History of pulmonary hypertension    Pre liver transplant  . History of seizures   . Hypertension   . Hypothyroidism   . Lynch syndrome   . Osteopenia   . Primary biliary cirrhosis (HCC)    Status post liver transplantation - follows at [facility]  . SCCA (squamous cell carcinoma) of skin 02/23/2020   Right Upper Chest(moderate) (MOH's)  . SCCA (squamous cell carcinoma) of skin 04/07/2020   Left Top Leg (Keratoacanthoma) treatment after biopsy  . SCCA (squamous cell carcinoma) of skin 04/07/2020   Left Foot Dorsal (in situ) treatment after biopsy  . Squamous cell carcinoma of skin 04/22/2018   KA-Right mid chest (txpbx), KA-left elbow crease (txpbx), insitu-Right mid chest inf. (exc)  . Squamous cell carcinoma of skin 05/20/2018   well diff-Left upper shin (txpbx), well diff-Right lower forearm (txpbx), well diff-Right upper shin (txpbx)  . Squamous cell carcinoma of skin  06/11/2018   Scc + margin-Right mid chest inferior   . Squamous cell carcinoma of skin 06/27/2018   well diff-Left mid thigh(txpbx), well diff-Left inner thigh (txpbx),insitu-Right cheek (txpbx),well diff-right inner heel (txpbx)  . Squamous cell carcinoma of skin 09/16/2018   well diff-left shoulder  (txpbx), well diff-Right chin (txpbx), well diff-right chest lateral (CX35FU)  . Squamous cell carcinoma of skin 10/01/2018   Right outer lower shin (Txpbx)  . Squamous cell carcinoma of skin 04/03/2019   well diff-Right center chest (MOHS), in situ-Right ear  . Squamous cell carcinoma of skin 08/05/2019   in situ-Left calf (txpbx), in situ-left bicep (txpbx), well diff-Left chest,inf(txpbx), in situ-Right chest inf-(txpbx)  . Squamous cell carcinoma of skin 11/19/2019   KA-left top leg (txpbx), modify-Riight forehead-(mohs), in situ-right hand (txpbx), in situ-Right forearm (txpbx), well diff-Right chest (txpbx), well diff-chin (txpbx)  . Squamous cell carcinoma of skin 01/06/2020   KA- Left top leg  . Squamous cell carcinoma of skin 05/12/2013   bowens-middle of chest (CX35FU)  . Squamous cell carcinoma of skin 05/18/2015   well diff-Left upper arm (CX35FU + Exc),KA-right chest(txpbx), in situ-Left shin (txpbx), well diff-Right cheek (CX35FU), KA-Left post scalp (CX35FU)  . Squamous cell carcinoma of skin 08/09/2015   KA-Left post scalp ((MOHS), in situ- mid chest (Txpbx +exc), in situ-Left upper arm inferior (txpbx)  . Squamous cell carcinoma of skin 10/13/2015   Left upper arm-clear  . Squamous cell carcinoma of skin 03/09/2016   mod diff-mid chest (txpbx+ exc), mod diff-Right chest (txpbx+exc), well diff-right cheek-(txpbx),well diff-Left hand-(txpbx), in situ-Left upper arm (txpbx), well diff-Right cheek -(txpbx), well diff-Right crease arm (txpbx)   . Squamous cell carcinoma of skin 05/24/2016   well diff-Right nasal crease-(MOHS)  . Squamous cell carcinoma of skin 08/02/2016   KA-Left chest med (txpbx)  . Squamous cell carcinoma of skin 08/30/2016   in situ-Left outer zygoma (txpbx)  . Squamous cell carcinoma of skin 12/06/2016   well diff-Left chest sup, Left shoudler, insitu- right post scalp  . Squamous cell carcinoma of skin 02/14/2017   well diff-Left forearm (EXC),in  situ-RIght ant neck  . Squamous cell carcinoma of skin 06/14/2017   in situ-Right forearm (txpbx), in situ-Right chest (txpbx), well diff-left chest (txpbx), well diff-anterior neck- (txpbx)  . Squamous cell carcinoma of skin 08/07/2017   well diff-Left upper shoulder (txpbx), sup and invasive-Left temple (txpbx), well diff-Right upper shin (txpbx), in situ-Right clavicle (txpbx)  . Squamous cell carcinoma of skin 10/17/2017   well diff-ant. neck (MOHS), in situ-Right chest, inf (txpbx)  . Squamous cell carcinoma of skin 01/21/2018   well diff- Right chest,ulnar (txpbx), well diff- right upper chest (txpbx), in situ-Right ant. crown (txpbx)    SURGICAL HISTORY: Past Surgical History:  Procedure Laterality Date  . ABDOMINAL HERNIA REPAIR     Patient's states that she has had 8- 9 hernia surgeries  . ABDOMINAL HYSTERECTOMY    . CATARACT EXTRACTION W/PHACO Right 01/15/2020   Procedure: CATARACT EXTRACTION PHACO AND INTRAOCULAR LENS PLACEMENT (IOC);  Surgeon: Baruch Goldmann, MD;  Location: [facility];  Service: Ophthalmology;  Laterality: Right;  CDE: 7.89  . CATARACT EXTRACTION W/PHACO Left 01/29/2020   Procedure: CATARACT EXTRACTION PHACO AND INTRAOCULAR LENS PLACEMENT (IOC) (CDE: 6.33);  Surgeon: Baruch Goldmann, MD;  Location: [facility];  Service: Ophthalmology;  Laterality: Left;  . CHOLECYSTECTOMY  2007  . COLON RESECTION    . COLON SURGERY  2008   Done at [facility]  . COLONOSCOPY  Done at [facility]  . ESOPHAGOGASTRODUODENOSCOPY N/A 08/21/2018   Procedure: ESOPHAGOGASTRODUODENOSCOPY (EGD);  Surgeon: Rogene Houston, MD;  [facility];  Service: Endoscopy;  Laterality: N/A;  . ESOPHAGOGASTRODUODENOSCOPY (EGD) WITH PROPOFOL N/A 12/16/2019   Procedure: ESOPHAGOGASTRODUODENOSCOPY (EGD) WITH PROPOFOL;  Surgeon: Rogene Houston, MD;  [facility];  Service: Endoscopy;  Laterality: N/A;  . EYE SURGERY     lasix  . FLEXIBLE SIGMOIDOSCOPY N/A 10/20/2015   Procedure: FLEXIBLE  SIGMOIDOSCOPY;  Surgeon: Rogene Houston, MD;  Location: [facility];  Service: Endoscopy;  Laterality: N/A;  1 - Dr Laural Golden has meeting until 1:00  . FLEXIBLE SIGMOIDOSCOPY N/A 07/11/2016   Procedure: FLEXIBLE SIGMOIDOSCOPY;  Surgeon: Rogene Houston, MD;  [facility];  Service: Endoscopy;  Laterality: N/A;  1200  . FLEXIBLE SIGMOIDOSCOPY N/A 08/09/2017   Procedure: FLEXIBLE SIGMOIDOSCOPY;  Surgeon: Rogene Houston, MD;  [facility];  Service: Endoscopy;  Laterality: N/A;  1:00  . FLEXIBLE SIGMOIDOSCOPY N/A 08/21/2018   Procedure: FLEXIBLE SIGMOIDOSCOPY;  Surgeon: Rogene Houston, MD;  [facility];  Service: Endoscopy;  Laterality: N/A;  . FLEXIBLE SIGMOIDOSCOPY N/A 12/16/2019   Procedure: FLEXIBLE SIGMOIDOSCOPY wirh Propofol;  Surgeon: Rogene Houston, MD;  [facility];  Service: Endoscopy;  Laterality: N/A;  7:30  . FRACTURE SURGERY     right wrist metal plate  . HERNIA REPAIR    . LIVER TRANSPLANT  [id number]  . multiple skin cancers removed    . POLYPECTOMY  08/09/2017   Procedure: POLYPECTOMY;  Surgeon: Rogene Houston, MD;  Location: [facility];  Service: Endoscopy;;  colon small bowel  . REVERSE SHOULDER ARTHROPLASTY Left 07/17/2018  . REVERSE SHOULDER ARTHROPLASTY Left 07/17/2018   Procedure: LEFT REVERSE SHOULDER ARTHROPLASTY;  Surgeon: Justice Britain, MD;  Location: [place];  Service: Orthopedics;  Laterality: Left;  133min  . SHOULDER CLOSED REDUCTION Left 09/27/2019   Procedure: CLOSED REDUCTION SHOULDER;  Surgeon: Paralee Cancel, MD;  Location: [facility];  Service: Orthopedics;  Laterality: Left;  . SPLENECTOMY  2006  . TOTAL SHOULDER REVISION Left 11/12/2019   Procedure: Revision Left Reverse Shoulder Arthroplasty with poly exchange SDD;  Surgeon: Justice Britain, MD;  Location: [facility];  Service: Orthopedics;  Laterality: Left;  160min -SDDC  . TYMPANOSTOMY TUBE PLACEMENT    . UPPER GASTROINTESTINAL ENDOSCOPY     Done at [facility]     SOCIAL HISTORY: Social History   Socioeconomic History  . Marital status: Married    Spouse name: Charlotte Crumb  . Number of children: 1  . Years of education: 57  . Highest education level: High school graduate  Occupational History  . Occupation: Disability    Employer: [employer]    Comment: Multimedia programmer  Tobacco Use  . Smoking status: Never Smoker  . Smokeless tobacco: Never Used  Vaping Use  . Vaping Use: Never used  Substance and Sexual Activity  . Alcohol use: No    Alcohol/week: 0.0 standard drinks  . Drug use: No  . Sexual activity: Yes    Birth control/protection: None  Other Topics Concern  . Not on file  Social History Narrative   Patient lives in a two story home with her husband. She has an adult son. She is retired from being an Web designer for 30 years.    Social Determinants of Health   Financial Resource Strain:   . Difficulty of Paying Living Expenses:   Food Insecurity:   .  Worried About Charity fundraiser in the Last Year:   . Arboriculturist in the Last Year:   Transportation Needs:   . Film/video editor (Medical):   [name] Lack of Transportation (Non-Medical):   Physical Activity: Inactive  . Days of Exercise per Week: 0 days  . Minutes of Exercise per Session: 0 min  Stress:   . Feeling of Stress :   Social Connections:   . Frequency of Communication with Friends and Family:   . Frequency of Social Gatherings with Friends and Family:   . Attends Religious Services:   . Active Member of Clubs or Organizations:   . Attends Archivist Meetings:   [name] Marital Status:   Intimate Partner Violence:   . Fear of Current or Ex-Partner:   . Emotionally Abused:   [name] Physically Abused:   . Sexually Abused:     FAMILY HISTORY: Family History  Problem Relation Age of Onset  . Prostate cancer Father   . Colon cancer Father   . Colon cancer Sister   . Lung cancer Sister   . Healthy Son   . Alcohol abuse Brother    . Allergic rhinitis Neg Hx   . Asthma Neg Hx   . Eczema Neg Hx   . Urticaria Neg Hx     Review of Systems  All other systems reviewed and are negative.   Vital signs: -Deferred due to telephone visit  Physical Exam -Deferred due to telephone visit -Patient was alert and oriented over the phone and in no acute distress    LABORATORY DATA:  CBC    Component Value Date/Time   WBC 7.9 05/18/2020 0950   RBC 4.30 05/18/2020 0950   HGB 12.1 05/18/2020 0950   HGB 11.1 04/13/2019 0940   HCT 40.5 05/18/2020 0950   HCT 34.5 04/13/2019 0940   PLT 322 05/18/2020 0950   PLT 380 04/13/2019 0940   MCV 94.2 05/18/2020 0950   MCV 85 04/13/2019 0940   MCH 28.1 05/18/2020 0950   MCHC 29.9 (L) 05/18/2020 0950   RDW 13.2 05/18/2020 0950   RDW 13.0 04/13/2019 0940   LYMPHSABS 2.0 05/18/2020 0950   LYMPHSABS 2.4 04/13/2019 0940   MONOABS 1.1 (H) 05/18/2020 0950   EOSABS 0.7 (H) 05/18/2020 0950   EOSABS 0.5 (H) 04/13/2019 0940   BASOSABS 0.2 (H) 05/18/2020 0950   BASOSABS 0.2 04/13/2019 0940    CMP     Component Value Date/Time   NA 139 05/18/2020 0950   NA 143 04/13/2019 0940   K 4.0 05/18/2020 0950   CL 104 05/18/2020 0950   CO2 27 05/18/2020 0950   GLUCOSE 97 05/18/2020 0950   BUN 21 05/18/2020 0950   BUN 14 04/13/2019 0940   CREATININE 1.05 (H) 05/18/2020 0950   CREATININE 0.62 12/23/2012 1140   CALCIUM 8.7 (L) 05/18/2020 0950   PROT 6.9 05/18/2020 0950   PROT 6.9 04/13/2019 0940   ALBUMIN 3.3 (L) 05/18/2020 0950   ALBUMIN 3.9 04/13/2019 0940   AST 23 05/18/2020 0950   ALT 16 05/18/2020 0950   ALKPHOS 107 05/18/2020 0950   BILITOT 0.4 05/18/2020 0950   BILITOT <0.2 04/13/2019 0940   GFRNONAA 54 (L) 05/18/2020 0950   GFRAA >60 05/18/2020 0950   All questions were answered to patient's stated satisfaction. Encouraged patient to call with any new concerns or questions before his next visit to the cancer center and we can certain see him sooner, if needed.  ASSESSMENT and THERAPY PLAN:   Iron deficiency anemia 1. Iron deficiency anemia: -She was unable to tolerate oral iron therapy in the past. -Last received Feraheme on 11/11/2019. -She denies any bleeding per rectum or melena. -She had a colonoscopy in the past secondary to Lynch syndrome. -Last visit patient's hemoglobin was 7.4.  Due to the patient's significant fatigue she received 1 unit of packed red blood cells.  Then 2 infusions of iron. -She is scheduled for an ERCP at [place] done on 05/18/2020 showed hemoglobin 12.1, ferritin 39, percent saturation 16. -I do not feel like she needs any iron infusions at this time. -We will see her back in 2 months with repeat labs.  2.  Thrombocytosis: -She was initially worked up for thrombocytosis with negative JAK2 mutations and BCR/ABL. -This is likely reactive to iron deficiency anemia. -Labs done on 05/18/2020 showed platelets 322 -They have normalized since we have replaced her iron.  3.  Primary biliary cirrhosis: -She had orthotopic liver transplant in 2015 at [facility]. -She is currently on sirolimus, ursodiol, valtrex.  4.  Osteoporosis: -She is on Fosamax 70 mg weekly.  5.  Skin cancer: -Patient reports she had a skin cancer removed off the top of her head. -She will be getting 5 weeks of radiation therapy.   Orders Placed This Encounter  Procedures  . Lactate dehydrogenase    Standing Status:   Future    Standing Expiration Date:   05/25/2021  . CBC with Differential/Platelet    Standing Status:   Future    Standing Expiration Date:   05/25/2021  . Comprehensive metabolic panel    Standing Status:   Future    Standing Expiration Date:   05/25/2021  . Ferritin    Standing Status:   Future    Standing Expiration Date:   05/25/2021  . Iron and TIBC    Standing Status:   Future    Standing Expiration Date:   05/25/2021  . Vitamin B12    Standing Status:   Future    Standing  Expiration Date:   05/25/2021  . VITAMIN D 25 Hydroxy (Vit-D Deficiency, Fractures)    Standing Status:   Future    Standing Expiration Date:   05/25/2021    All questions were answered. The patient knows to call the clinic with any problems, questions or concerns. We can certainly see the patient much sooner if necessary. This note was electronically signed.  I provided 29 minutes of non face-to-face telephone visit time during this encounter, and > 50% was spent counseling as documented under my assessment & plan.   Glennie Isle, NP-C 05/25/2020

## 2020-05-25 NOTE — Assessment & Plan Note (Signed)
1. Iron deficiency anemia: -She was unable to tolerate oral iron therapy in the past. -Last received Feraheme on 11/11/2019. -She denies any bleeding per rectum or melena. -She had a colonoscopy in the past secondary to Lynch syndrome. -Last visit patient's hemoglobin was 7.4.  Due to the patient's significant fatigue she received 1 unit of packed red blood cells.  Then 2 infusions of iron. -She is scheduled for an ERCP at [place] done on 05/18/2020 showed hemoglobin 12.1, ferritin 39, percent saturation 16. -I do not feel like she needs any iron infusions at this time. -We will see her back in 2 months with repeat labs.  2.  Thrombocytosis: -She was initially worked up for thrombocytosis with negative JAK2 mutations and BCR/ABL. -This is likely reactive to iron deficiency anemia. -Labs done on 05/18/2020 showed platelets 322 -They have normalized since we have replaced her iron.  3.  Primary biliary cirrhosis: -She had orthotopic liver transplant in 2015 at [facility]. -She is currently on sirolimus, ursodiol, valtrex.  4.  Osteoporosis: -She is on Fosamax 70 mg weekly.  5.  Skin cancer: -Patient reports she had a skin cancer removed off the top of her head. -She will be getting 5 weeks of radiation therapy.

## 2020-05-27 NOTE — Telephone Encounter (Signed)
   Incoming Patient Call  05/27/2020  What symptoms do you have? Burning with urination  How long have you been sick? Started This morning   Have you been seen for this problem? No, Pt called office to make televist appt for UTI, there is nothing available and would like to know if Alyse Low can work her in or she can drop off urine sample and have it checked.   If your provider decides to give you a prescription, which pharmacy would you like for it to be sent to? [facility]  Patient informed that this information will be sent to the clinical staff for review and that they should receive a follow up call.

## 2020-05-31 ENCOUNTER — Other Ambulatory Visit: Payer: Self-pay | Admitting: Family

## 2020-05-31 DIAGNOSIS — K219 Gastro-esophageal reflux disease without esophagitis: Secondary | ICD-10-CM

## 2020-05-31 DIAGNOSIS — R1013 Epigastric pain: Secondary | ICD-10-CM

## 2020-06-01 NOTE — Addendum Note (Signed)
Addended by: Robyne Askew R on: 06/01/2020 11:15 AM   Modules accepted: Orders

## 2020-06-06 ENCOUNTER — Telehealth: Payer: Self-pay

## 2020-06-06 NOTE — Telephone Encounter (Signed)
-----   Message from Cassidy Barber, [place] sent at 06/02/2020 11:04 AM EDT ----- Please check her status.  If any recurrence or recent lesions treated.   Schedule Mohs.

## 2020-06-06 NOTE — Telephone Encounter (Signed)
See Vida Roller message, left patient message to call office

## 2020-06-07 ENCOUNTER — Other Ambulatory Visit: Payer: Self-pay

## 2020-06-07 ENCOUNTER — Other Ambulatory Visit: Payer: Medicare Other

## 2020-06-07 DIAGNOSIS — Z944 Liver transplant status: Secondary | ICD-10-CM | POA: Diagnosis not present

## 2020-06-23 DIAGNOSIS — Z944 Liver transplant status: Secondary | ICD-10-CM | POA: Diagnosis not present

## 2020-06-27 ENCOUNTER — Ambulatory Visit (INDEPENDENT_AMBULATORY_CARE_PROVIDER_SITE_OTHER): Payer: Medicare Other | Admitting: *Deleted

## 2020-06-27 DIAGNOSIS — Z0000 Encounter for general adult medical examination without abnormal findings: Secondary | ICD-10-CM | POA: Diagnosis not present

## 2020-06-27 NOTE — Progress Notes (Signed)
MEDICARE ANNUAL WELLNESS VISIT  06/27/2020  Telephone Visit Disclaimer This Medicare AWV was conducted by telephone due to national recommendations for restrictions regarding the COVID-19 Pandemic (e.g. social distancing).  I verified, using two identifiers, that I am speaking with Cassidy Barber or their authorized healthcare agent. I discussed the limitations, risks, security, and privacy concerns of performing an evaluation and management service by telephone and the potential availability of an in-person appointment in the future. The patient expressed understanding and agreed to proceed.   Subjective:  Cassidy Barber is a 70 y.o. female patient of Cassidy Barber, Cassidy Hawthorne, FNP who had a Medicare Annual Wellness Visit today via telephone. [name] is Retired and lives with their spouse. she has 1 child. she reports that she is socially active and does interact with friends/family regularly. she is moderately physically active and enjoys crafting, making bows, signs and wreaths.  Patient Care Team: [name], FNP as PCP - General (Family Medicine) Satira Sark, MD as PCP - Cardiology (Cardiology) Eustace Moore, MD as Consulting Physician (Neurosurgery) Starlyn Skeans as Physician Assistant (Dermatology)  Advanced Directives 06/27/2020 01/29/2020 01/13/2020 12/18/2019 12/16/2019 12/14/2019 12/10/2019  Does Patient Have a Medical Advance Directive? No No No No No No No  Type of Advance Directive - - - - - - -  Copy of Healthcare Power of Attorney in Chart? - - - - - - -  Would patient like information on creating a medical advance directive? No - Patient declined No - Patient declined No - Patient declined No - Patient declined No - Patient declined No - Patient declined No - Patient declined  Pre-existing out of facility DNR order (yellow form or pink MOST form) - - - - - - -    Hospital Utilization Over the Past 12 Months: # of hospitalizations or ER visits: 0 # of surgeries:  1  Review of Systems    Patient reports that her overall health is better compared to last year.  History obtained from chart review and the patient  Patient Reported Readings (BP, Pulse, CBG, Weight, etc) none  Pain Assessment Pain : No/denies pain     Current Medications & Allergies (verified) Allergies as of 06/27/2020      Reactions   Ciprofloxacin Itching   Codeine Nausea Only      Medication List       Accurate as of June 27, 2020  9:38 AM. If you have any questions, ask your nurse or doctor.        STOP taking these medications   EPINEPHrine 0.3 mg/0.3 mL Soaj injection Commonly known as: EPI-PEN     TAKE these medications   acetaminophen 500 MG tablet Commonly known as: TYLENOL Take 1,000 mg by mouth every 6 (six) hours as needed (for pain.).   alendronate 70 MG tablet Commonly known as: FOSAMAX Take 1 tablet (70 mg total) by mouth every Friday. Take with a full glass of water on an empty stomach.   ALPRAZolam 0.5 MG tablet Commonly known as: XANAX Take 1 tablet (0.5 mg total) by mouth at bedtime.   aspirin EC 81 MG tablet Take 81 mg by mouth daily.   CALCIUM CITRATE + D3 PO Take 1 tablet by mouth 2 (two) times daily.   cetirizine 10 MG tablet Commonly known as: ZYRTEC TAKE 1 TO 2 TABLETS TWICE DAILY What changed:   how much to take  how to take this  when to take  this  additional instructions   cyclobenzaprine 5 MG tablet Commonly known as: FLEXERIL Take 1 tablet (5 mg total) by mouth 3 (three) times daily as needed for muscle spasms.   famotidine 20 MG tablet Commonly known as: PEPCID Take 1 tablet (20 mg total) by mouth 2 (two) times daily.   Fish Oil 1000 MG Caps Take by mouth.   fluorouracil 5 % cream Commonly known as: EFUDEX Apply 1 application topically daily as needed (cancer spots).   hydrOXYzine 25 MG tablet Commonly known as: ATARAX/VISTARIL TAKE 1 TABLET THREE TIMES DAILY AS NEEDED FOR ITCHING What changed:    how much to take  how to take this  when to take this  reasons to take this  additional instructions   levothyroxine 125 MCG tablet Commonly known as: SYNTHROID Take 1 tablet (125 mcg total) by mouth daily before breakfast.   losartan 25 MG tablet Commonly known as: COZAAR TAKE ONE (1) TABLET EACH DAY   metoprolol succinate 25 MG 24 hr tablet Commonly known as: Toprol XL Take 1 tablet (25 mg total) by mouth daily.   pantoprazole 40 MG tablet Commonly known as: PROTONIX TAKE ONE TABLET BY MOUTH TWICE DAILY   SSD 1 % cream Generic drug: silver sulfADIAZINE   ursodiol 250 MG tablet Commonly known as: ACTIGALL Take 250 mg by mouth 2 (two) times daily.   Vitamin B-12 5000 MCG Tbdp Take 5,000 mcg by mouth every Monday, Wednesday, and Friday.   Vitamin D (Ergocalciferol) 1.25 MG (50000 UNIT) Caps capsule Commonly known as: DRISDOL TAKE 1 CAPSULE EVERY WEEK What changed: See the new instructions.   vitamin E 180 MG (400 UNITS) capsule Take 400 Units by mouth daily.   Zortress 0.5 MG Tabs Generic drug: Everolimus Take 2 mg by mouth 2 (two) times daily.       History (reviewed): Past Medical History:  Diagnosis Date  . Abdominal wall hernia    Incarcerated status post surgical repair 2019 - [facility]  . Anemia of chronic disease   . Atypical nevus 01/21/2018   atypical neoplasm- Left scalp-ant (txpbx + MOHS), atypical neoplasm- Left scalp post- (txpbx + MOHS)  . Colon cancer [facility])    Colon surgery 2005 and 2012  . History of pulmonary hypertension    Pre liver transplant  . History of seizures   . Hypertension   . Hypothyroidism   . Lynch syndrome   . Osteopenia   . Primary biliary cirrhosis (HCC)    Status post liver transplantation - follows at [facility]  . SCCA (squamous cell carcinoma) of skin 02/23/2020   Right Upper Chest(moderate) (MOH's)  . SCCA (squamous cell carcinoma) of skin 04/07/2020   Left Top Leg (Keratoacanthoma) treatment after biopsy  .  SCCA (squamous cell carcinoma) of skin 04/07/2020   Left Foot Dorsal (in situ) treatment after biopsy  . Squamous cell carcinoma of skin 04/22/2018   KA-Right mid chest (txpbx), KA-left elbow crease (txpbx), insitu-Right mid chest inf. (exc)  . Squamous cell carcinoma of skin 05/20/2018   well diff-Left upper shin (txpbx), well diff-Right lower forearm (txpbx), well diff-Right upper shin (txpbx)  . Squamous cell carcinoma of skin 06/11/2018   Scc + margin-Right mid chest inferior   . Squamous cell carcinoma of skin 06/27/2018   well diff-Left mid thigh(txpbx), well diff-Left inner thigh (txpbx),insitu-Right cheek (txpbx),well diff-right inner heel (txpbx)  . Squamous cell carcinoma of skin 09/16/2018   well diff-left shoulder (txpbx), well diff-Right chin (txpbx), well diff-right chest lateral (  CX35FU)  . Squamous cell carcinoma of skin 10/01/2018   Right outer lower shin (Txpbx)  . Squamous cell carcinoma of skin 04/03/2019   well diff-Right center chest (MOHS), in situ-Right ear  . Squamous cell carcinoma of skin 08/05/2019   in situ-Left calf (txpbx), in situ-left bicep (txpbx), well diff-Left chest,inf(txpbx), in situ-Right chest inf-(txpbx)  . Squamous cell carcinoma of skin 11/19/2019   KA-left top leg (txpbx), modify-Riight forehead-(mohs), in situ-right hand (txpbx), in situ-Right forearm (txpbx), well diff-Right chest (txpbx), well diff-chin (txpbx)  . Squamous cell carcinoma of skin 01/06/2020   KA- Left top leg  . Squamous cell carcinoma of skin 05/12/2013   bowens-middle of chest (CX35FU)  . Squamous cell carcinoma of skin 05/18/2015   well diff-Left upper arm (CX35FU + Exc),KA-right chest(txpbx), in situ-Left shin (txpbx), well diff-Right cheek (CX35FU), KA-Left post scalp (CX35FU)  . Squamous cell carcinoma of skin 08/09/2015   KA-Left post scalp ((MOHS), in situ- mid chest (Txpbx +exc), in situ-Left upper arm inferior (txpbx)  . Squamous cell carcinoma of skin 10/13/2015    Left upper arm-clear  . Squamous cell carcinoma of skin 03/09/2016   mod diff-mid chest (txpbx+ exc), mod diff-Right chest (txpbx+exc), well diff-right cheek-(txpbx),well diff-Left hand-(txpbx), in situ-Left upper arm (txpbx), well diff-Right cheek -(txpbx), well diff-Right crease arm (txpbx)   . Squamous cell carcinoma of skin 05/24/2016   well diff-Right nasal crease-(MOHS)  . Squamous cell carcinoma of skin 08/02/2016   KA-Left chest med (txpbx)  . Squamous cell carcinoma of skin 08/30/2016   in situ-Left outer zygoma (txpbx)  . Squamous cell carcinoma of skin 12/06/2016   well diff-Left chest sup, Left shoudler, insitu- right post scalp  . Squamous cell carcinoma of skin 02/14/2017   well diff-Left forearm (EXC),in situ-RIght ant neck  . Squamous cell carcinoma of skin 06/14/2017   in situ-Right forearm (txpbx), in situ-Right chest (txpbx), well diff-left chest (txpbx), well diff-anterior neck- (txpbx)  . Squamous cell carcinoma of skin 08/07/2017   well diff-Left upper shoulder (txpbx), sup and invasive-Left temple (txpbx), well diff-Right upper shin (txpbx), in situ-Right clavicle (txpbx)  . Squamous cell carcinoma of skin 10/17/2017   well diff-ant. neck (MOHS), in situ-Right chest, inf (txpbx)  . Squamous cell carcinoma of skin 01/21/2018   well diff- Right chest,ulnar (txpbx), well diff- right upper chest (txpbx), in situ-Right ant. crown (txpbx)   Past Surgical History:  Procedure Laterality Date  . ABDOMINAL HERNIA REPAIR     Patient's states that she has had 8- 9 hernia surgeries  . ABDOMINAL HYSTERECTOMY    . CATARACT EXTRACTION W/PHACO Right 01/15/2020   Procedure: CATARACT EXTRACTION PHACO AND INTRAOCULAR LENS PLACEMENT (IOC);  Surgeon: Baruch Goldmann, MD;  Location: [facility];  Service: Ophthalmology;  Laterality: Right;  CDE: 7.89  . CATARACT EXTRACTION W/PHACO Left 01/29/2020   Procedure: CATARACT EXTRACTION PHACO AND INTRAOCULAR LENS PLACEMENT (IOC) (CDE: 6.33);  Surgeon:  Baruch Goldmann, MD;  Location: [facility];  Service: Ophthalmology;  Laterality: Left;  . CHOLECYSTECTOMY  2007  . COLON RESECTION    . COLON SURGERY  2008   [facility] at [facility]  . COLONOSCOPY     Done at [facility]  . ESOPHAGOGASTRODUODENOSCOPY N/A 08/21/2018   Procedure: ESOPHAGOGASTRODUODENOSCOPY (EGD);  Surgeon: Rogene Houston, MD;  [facility];  Service: Endoscopy;  Laterality: N/A;  . ESOPHAGOGASTRODUODENOSCOPY (EGD) WITH PROPOFOL N/A 12/16/2019   Procedure: ESOPHAGOGASTRODUODENOSCOPY (EGD) WITH PROPOFOL;  Surgeon: Rogene Houston, MD;  [facility];  Service:  Endoscopy;  Laterality: N/A;  . EYE SURGERY     lasix  . FLEXIBLE SIGMOIDOSCOPY N/A 10/20/2015   Procedure: FLEXIBLE SIGMOIDOSCOPY;  Surgeon: Rogene Houston, MD;  [facility]: [facility];  Service: Endoscopy;  Laterality: N/A;  65 - Dr Laural Golden has meeting until 1:00  . FLEXIBLE SIGMOIDOSCOPY N/A 07/11/2016   Procedure: FLEXIBLE SIGMOIDOSCOPY;  Surgeon: Rogene Houston, MD;  [facility];  Service: Endoscopy;  Laterality: N/A;  1200  . FLEXIBLE SIGMOIDOSCOPY N/A 08/09/2017   Procedure: FLEXIBLE SIGMOIDOSCOPY;  Surgeon: Rogene Houston, MD;  [facility];  Service: Endoscopy;  Laterality: N/A;  1:00  . FLEXIBLE SIGMOIDOSCOPY N/A 08/21/2018   Procedure: FLEXIBLE SIGMOIDOSCOPY;  Surgeon: Rogene Houston, MD;  [facility];  Service: Endoscopy;  Laterality: N/A;  . FLEXIBLE SIGMOIDOSCOPY N/A 12/16/2019   Procedure: FLEXIBLE SIGMOIDOSCOPY wirh Propofol;  Surgeon: Rogene Houston, MD;  [facility];  Service: Endoscopy;  Laterality: N/A;  7:30  . FRACTURE SURGERY     right wrist metal plate  . HERNIA REPAIR    . LIVER TRANSPLANT  [id number]  . multiple skin cancers removed    . POLYPECTOMY  08/09/2017   Procedure: POLYPECTOMY;  Surgeon: Rogene Houston, MD;  [facility];  Service: Endoscopy;;  colon small bowel  . REVERSE SHOULDER ARTHROPLASTY Left 07/17/2018  . REVERSE  SHOULDER ARTHROPLASTY Left 07/17/2018   Procedure: LEFT REVERSE SHOULDER ARTHROPLASTY;  Surgeon: Justice Britain, MD;  Location: [facility];  Service: Orthopedics;  Laterality: Left;  154min  . SHOULDER CLOSED REDUCTION Left 09/27/2019   Procedure: CLOSED REDUCTION SHOULDER;  Surgeon: Paralee Cancel, MD;  Location: [facility];  Service: Orthopedics;  Laterality: Left;  . SPLENECTOMY  2006  . TOTAL SHOULDER REVISION Left 11/12/2019   Procedure: Revision Left Reverse Shoulder Arthroplasty with poly exchange SDD;  Surgeon: Justice Britain, MD;  Location: [facility];  Service: Orthopedics;  Laterality: Left;  116min -SDDC  . TYMPANOSTOMY TUBE PLACEMENT    . UPPER GASTROINTESTINAL ENDOSCOPY     Done at [facility]   Family History  Problem Relation Age of Onset  . Prostate cancer Father   . Colon cancer Father   . Colon cancer Sister   . Lung cancer Sister   . Healthy Son   . Alcohol abuse Brother   . Allergic rhinitis Neg Hx   . Asthma Neg Hx   . Eczema Neg Hx   . Urticaria Neg Hx    Social History   Socioeconomic History  . Marital status: Married    Spouse name: Charlotte Crumb  . Number of children: 1  . Years of education: 11  . Highest education level: High school graduate  Occupational History  . Occupation: Disability    Employer: [employer]    Comment: Multimedia programmer  Tobacco Use  . Smoking status: Never Smoker  . Smokeless tobacco: Never Used  Vaping Use  . Vaping Use: Never used  Substance and Sexual Activity  . Alcohol use: No    Alcohol/week: 0.0 standard drinks  . Drug use: No  . Sexual activity: Yes    Birth control/protection: None  Other Topics Concern  . Not on file  Social History Narrative   Patient lives in a two story home with her husband. She has an adult son. She is retired from being an Web designer for 30 years.    Social Determinants of Health   Financial Resource Strain: Low Risk   .  Difficulty of Paying Living Expenses: Not hard at all  Food  Insecurity: No Food Insecurity  . Worried About Charity fundraiser in the Last Year: Never true  . Ran Out of Food in the Last Year: Never true  Transportation Needs: No Transportation Needs  . Lack of Transportation (Medical): No  . Lack of Transportation (Non-Medical): No  Physical Activity: Sufficiently Active  . Days of Exercise per Week: 7 days  . Minutes of Exercise per Session: 30 min  Stress: No Stress Concern Present  . Feeling of Stress : Not at all  Social Connections: Socially Integrated  . Frequency of Communication with Friends and Family: More than three times a week  . Frequency of Social Gatherings with Friends and Family: More than three times a week  . Attends Religious Services: More than 4 times per year  . Active Member of Clubs or Organizations: Yes  . Attends Archivist Meetings: More than 4 times per year  . Marital Status: Married    Activities of Daily Living In your present state of health, do you have any difficulty performing the following activities: 06/27/2020 01/15/2020  Hearing? N -  Vision? N -  Comment had cataracts removed from both eyes earlier this year -  Difficulty concentrating or making decisions? N -  Walking or climbing stairs? N -  Dressing or bathing? N -  Doing errands, shopping? N N  Preparing Food and eating ? N -  Using the Toilet? N -  In the past six months, have you accidently leaked urine? N -  Do you have problems with loss of bowel control? N -  Managing your Medications? N -  Managing your Finances? N -  Housekeeping or managing your Housekeeping? N -  Some recent data might be hidden    Patient Education/ Literacy How often do you need to have someone help you when you read instructions, pamphlets, or other written materials from your doctor or pharmacy?: 1 - Never What is the last grade level you completed in school?: 12th grade  Exercise Current Exercise Habits: Home exercise routine, Type of exercise:  walking, Time (Minutes): 30, Frequency (Times/Week): 7, Weekly Exercise (Minutes/Week): 210, Intensity: Mild, Exercise limited by: None identified  Diet Patient reports consuming 2 meals a day and 1 snack(s) a day Patient reports that her primary diet is: Regular Patient reports that she does have regular access to food.   Depression Screen PHQ 2/9 Scores 06/27/2020 10/19/2019 08/28/2019 06/23/2019 06/09/2019 04/13/2019 01/15/2019  PHQ - 2 Score 0 0 0 0 0 0 0     Fall Risk Fall Risk  06/27/2020 10/19/2019 08/28/2019 06/23/2019 06/09/2019  Falls in the past year? 0 1 0 0 0  Number falls in past yr: - 1 - - -  Injury with Fall? - 1 - - -  Comment - Hurt lower back. - - -  Risk for fall due to : - History of fall(s) - - -  Follow up - Falls prevention discussed - - -     Objective:  Cassidy Barber seemed alert and oriented and she participated appropriately during our telephone visit.  Blood Pressure Weight BMI  BP Readings from Last 3 Encounters:  05/03/20 138/77  01/29/20 120/63  01/15/20 109/76   Wt Readings from Last 3 Encounters:  05/03/20 142 lb 9.6 oz (64.7 kg)  01/15/20 140 lb (63.5 kg)  01/13/20 140 lb (63.5 kg)   BMI Readings from Last 1 Encounters:  05/03/20 24.48 kg/m    *Unable to obtain current vital signs, weight, and BMI due to telephone visit type  Hearing/Vision  . Maurita did not seem to have difficulty with hearing/understanding during the telephone conversation . Reports that she has had a formal eye exam by an eye care professional within the past year . Reports that she has not had a formal hearing evaluation within the past year *Unable to fully assess hearing and vision during telephone visit type  Cognitive Function: 6CIT Screen 06/27/2020 06/23/2019  What Year? 0 points 0 points  What month? 0 points 0 points  What time? 0 points 0 points  Count back from 20 2 points 0 points  Months in reverse 2 points 0 points  Repeat phrase 0 points 2 points  Total Score  4 2   (Normal:0-7, Significant for Dysfunction: >8)  Normal Cognitive Function Screening: Yes   Immunization & Health Maintenance Record Immunization History  Administered Date(s) Administered  . Fluad Quad(high Dose 65+) 08/21/2019  . Hepatitis B, adult 02/28/2004, 03/27/2004, 08/21/2004  . HiB (PRP-T) 01/15/2003  . Influenza Split 08/06/2011, 09/18/2012, 09/13/2015  . Influenza, High Dose Seasonal PF 07/18/2018, 08/05/2019  . Influenza,inj,Quad PF,6+ Mos 08/07/2010, 07/31/2013, 08/16/2014, 09/01/2015  . Influenza-Unspecified 07/13/2013, 09/01/2015, 08/27/2016, 07/26/2017  . Meningococcal Polysaccharide 01/15/2003  . Pneumococcal Conjugate-13 09/01/2015, 07/26/2017  . Pneumococcal Polysaccharide-23 01/15/2003, 08/06/2011, 07/13/2017  . Pneumococcal-Unspecified 07/13/2012  . Tdap 08/28/2016, 03/31/2018, 07/01/2019  . Zoster Recombinat (Shingrix) 04/21/2018, 06/23/2018    Health Maintenance  Topic Date Due  . COVID-19 Vaccine (1) Never done  . INFLUENZA VACCINE  06/12/2020  . MAMMOGRAM  01/03/2022  . COLONOSCOPY  08/12/2024  . TETANUS/TDAP  06/30/2029  . DEXA SCAN  Completed  . Hepatitis C Screening  Completed  . PNA vac Low Risk Adult  Completed       Assessment  This is a routine wellness examination for [place] Maintenance: Due or Overdue Health Maintenance Due  Topic Date Due  . COVID-19 Vaccine (1) Never done  . INFLUENZA VACCINE  06/12/2020    Cassidy Barber does not need a referral for Community Assistance: Care Management:   no Social Work:    no Prescription Assistance:  no Nutrition/Diabetes Education:  no   Plan:  Personalized Goals Goals Addressed            This Visit's Progress   . Prevent falls        Personalized Health Maintenance & Screening Recommendations  Influenza vaccine  Lung Cancer Screening Recommended: no (Low Dose CT Chest recommended if Age 67-80 years, 30 pack-year currently smoking OR have quit w/in past  15 years) Hepatitis C Screening recommended: no HIV Screening recommended: no  Advanced Directives: Written information was not prepared per patient's request.  Referrals & Orders No orders of the defined types were placed in this encounter.   Follow-up Plan . Follow-up with [name], FNP as planned . Consider Flu vaccine at your next visit with your PCP . Bring your COVID vaccine card in for our records    I have personally reviewed and noted the following in the patient's chart:   . Medical and social history . Use of alcohol, tobacco or illicit drugs  . Current medications and supplements . Functional ability and status . Nutritional status . Physical activity . Advanced directives . List of other physicians . Hospitalizations, surgeries, and ER visits in previous 12 months . Vitals . Screenings to include cognitive,  depression, and falls . Referrals and appointments  In addition, I have reviewed and discussed with Cassidy Barber certain preventive protocols, quality metrics, and best practice recommendations. A written personalized care plan for preventive services as well as general preventive health recommendations is available and can be mailed to the patient at her request.      Milas Hock, LPN  [phone]

## 2020-06-27 NOTE — Patient Instructions (Signed)
Preventive Care 38 Years and Older, Female Preventive care refers to lifestyle choices and visits with your health care provider that can promote health and wellness. This includes:  A yearly physical exam. This is also called an annual well check.  Regular dental and eye exams.  Immunizations.  Screening for certain conditions.  Healthy lifestyle choices, such as diet and exercise. What can I expect for my preventive care visit? Physical exam Your health care provider will check:  Height and weight. These may be used to calculate body mass index (BMI), which is a measurement that tells if you are at a healthy weight.  Heart rate and blood pressure.  Your skin for abnormal spots. Counseling Your health care provider may ask you questions about:  Alcohol, tobacco, and drug use.  Emotional well-being.  Home and relationship well-being.  Sexual activity.  Eating habits.  History of falls.  Memory and ability to understand (cognition).  Work and work Statistician.  Pregnancy and menstrual history. What immunizations do I need?  Influenza (flu) vaccine  This is recommended every year. Tetanus, diphtheria, and pertussis (Tdap) vaccine  You may need a Td booster every 10 years. Varicella (chickenpox) vaccine  You may need this vaccine if you have not already been vaccinated. Zoster (shingles) vaccine  You may need this after age 33. Pneumococcal conjugate (PCV13) vaccine  One dose is recommended after age 33. Pneumococcal polysaccharide (PPSV23) vaccine  One dose is recommended after age 72. Measles, mumps, and rubella (MMR) vaccine  You may need at least one dose of MMR if you were born in 1957 or later. You may also need a second dose. Meningococcal conjugate (MenACWY) vaccine  You may need this if you have certain conditions. Hepatitis A vaccine  You may need this if you have certain conditions or if you travel or work in places where you may be exposed  to hepatitis A. Hepatitis B vaccine  You may need this if you have certain conditions or if you travel or work in places where you may be exposed to hepatitis B. Haemophilus influenzae type b (Hib) vaccine  You may need this if you have certain conditions. You may receive vaccines as individual doses or as more than one vaccine together in one shot (combination vaccines). Talk with your health care provider about the risks and benefits of combination vaccines. What tests do I need? Blood tests  Lipid and cholesterol levels. These may be checked every 5 years, or more frequently depending on your overall health.  Hepatitis C test.  Hepatitis B test. Screening  Lung cancer screening. You may have this screening every year starting at age 39 if you have a 30-pack-year history of smoking and currently smoke or have quit within the past 15 years.  Colorectal cancer screening. All adults should have this screening starting at age 36 and continuing until age 15. Your health care provider may recommend screening at age 23 if you are at increased risk. You will have tests every 1-10 years, depending on your results and the type of screening test.  Diabetes screening. This is done by checking your blood sugar (glucose) after you have not eaten for a while (fasting). You may have this done every 1-3 years.  Mammogram. This may be done every 1-2 years. Talk with your health care provider about how often you should have regular mammograms.  BRCA-related cancer screening. This may be done if you have a family history of breast, ovarian, tubal, or peritoneal cancers.  Other tests  Sexually transmitted disease (STD) testing.  Bone density scan. This is done to screen for osteoporosis. You may have this done starting at age 44. Follow these instructions at home: Eating and drinking  Eat a diet that includes fresh fruits and vegetables, whole grains, lean protein, and low-fat dairy products. Limit  your intake of foods with high amounts of sugar, saturated fats, and salt.  Take vitamin and mineral supplements as recommended by your health care provider.  Do not drink alcohol if your health care provider tells you not to drink.  If you drink alcohol: ? Limit how much you have to 0-1 drink a day. ? Be aware of how much alcohol is in your drink. In the [place] one drink equals one 12 oz bottle of beer (355 mL), one 5 oz glass of wine (148 mL), or one 1 oz glass of hard liquor (44 mL). Lifestyle  Take daily care of your teeth and gums.  Stay active. Exercise for at least 30 minutes on 5 or more days each week.  Do not use any products that contain nicotine or tobacco, such as cigarettes, e-cigarettes, and chewing tobacco. If you need help quitting, ask your health care provider.  If you are sexually active, practice safe sex. Use a condom or other form of protection in order to prevent STIs (sexually transmitted infections).  Talk with your health care provider about taking a low-dose aspirin or statin. What's next?  Go to your health care provider once a year for a well check visit.  Ask your health care provider how often you should have your eyes and teeth checked.  Stay up to date on all vaccines. This information is not intended to replace advice given to you by your health care provider. Make sure you discuss any questions you have with your health care provider. Document Revised: 10/23/2018 Document Reviewed: 10/23/2018 Elsevier Patient Education  2020 [employer].

## 2020-06-28 ENCOUNTER — Ambulatory Visit (INDEPENDENT_AMBULATORY_CARE_PROVIDER_SITE_OTHER): Payer: Medicare Other | Admitting: Family

## 2020-06-28 ENCOUNTER — Other Ambulatory Visit: Payer: Self-pay

## 2020-06-28 ENCOUNTER — Encounter: Payer: Self-pay | Admitting: Family

## 2020-06-28 VITALS — BP 138/68 | HR 52 | Temp 97.3°F | Ht 64.0 in | Wt 141.6 lb

## 2020-06-28 DIAGNOSIS — E559 Vitamin D deficiency, unspecified: Secondary | ICD-10-CM

## 2020-06-28 DIAGNOSIS — D638 Anemia in other chronic diseases classified elsewhere: Secondary | ICD-10-CM

## 2020-06-28 DIAGNOSIS — I10 Essential (primary) hypertension: Secondary | ICD-10-CM | POA: Diagnosis not present

## 2020-06-28 DIAGNOSIS — K219 Gastro-esophageal reflux disease without esophagitis: Secondary | ICD-10-CM

## 2020-06-28 DIAGNOSIS — E039 Hypothyroidism, unspecified: Secondary | ICD-10-CM | POA: Diagnosis not present

## 2020-06-28 DIAGNOSIS — Z23 Encounter for immunization: Secondary | ICD-10-CM

## 2020-06-28 DIAGNOSIS — Z944 Liver transplant status: Secondary | ICD-10-CM

## 2020-06-28 DIAGNOSIS — G4700 Insomnia, unspecified: Secondary | ICD-10-CM

## 2020-06-28 DIAGNOSIS — F411 Generalized anxiety disorder: Secondary | ICD-10-CM

## 2020-06-28 DIAGNOSIS — Z79899 Other long term (current) drug therapy: Secondary | ICD-10-CM

## 2020-06-28 DIAGNOSIS — F1320 Sedative, hypnotic or anxiolytic dependence, uncomplicated: Secondary | ICD-10-CM

## 2020-06-28 MED ORDER — ALPRAZOLAM 0.5 MG PO TABS: 0.5000 mg | ORAL_TABLET | Freq: Every day | ORAL | 1 refills | Status: DC

## 2020-06-28 NOTE — Patient Instructions (Signed)
Health Maintenance After Age 69 After age 69, you are at a higher risk for certain long-term diseases and infections as well as injuries from falls. Falls are a major cause of broken bones and head injuries in people who are older than age 69. Getting regular preventive care can help to keep you healthy and well. Preventive care includes getting regular testing and making lifestyle changes as recommended by your health care provider. Talk with your health care provider about:  Which screenings and tests you should have. A screening is a test that checks for a disease when you have no symptoms.  A diet and exercise plan that is right for you. What should I know about screenings and tests to prevent falls? Screening and testing are the best ways to find a health problem early. Early diagnosis and treatment give you the best chance of managing medical conditions that are common after age 69. Certain conditions and lifestyle choices may make you more likely to have a fall. Your health care provider may recommend:  Regular vision checks. Poor vision and conditions such as cataracts can make you more likely to have a fall. If you wear glasses, make sure to get your prescription updated if your vision changes.  Medicine review. Work with your health care provider to regularly review all of the medicines you are taking, including over-the-counter medicines. Ask your health care provider about any side effects that may make you more likely to have a fall. Tell your health care provider if any medicines that you take make you feel dizzy or sleepy.  Osteoporosis screening. Osteoporosis is a condition that causes the bones to get weaker. This can make the bones weak and cause them to break more easily.  Blood pressure screening. Blood pressure changes and medicines to control blood pressure can make you feel dizzy.  Strength and balance checks. Your health care provider may recommend certain tests to check your  strength and balance while standing, walking, or changing positions.  Foot health exam. Foot pain and numbness, as well as not wearing proper footwear, can make you more likely to have a fall.  Depression screening. You may be more likely to have a fall if you have a fear of falling, feel emotionally low, or feel unable to do activities that you used to do.  Alcohol use screening. Using too much alcohol can affect your balance and may make you more likely to have a fall. What actions can I take to lower my risk of falls? General instructions  Talk with your health care provider about your risks for falling. Tell your health care provider if: ? You fall. Be sure to tell your health care provider about all falls, even ones that seem minor. ? You feel dizzy, sleepy, or off-balance.  Take over-the-counter and prescription medicines only as told by your health care provider. These include any supplements.  Eat a healthy diet and maintain a healthy weight. A healthy diet includes low-fat dairy products, low-fat (lean) meats, and fiber from whole grains, beans, and lots of fruits and vegetables. Home safety  Remove any tripping hazards, such as rugs, cords, and clutter.  Install safety equipment such as grab bars in bathrooms and safety rails on stairs.  Keep rooms and walkways well-lit. Activity   Follow a regular exercise program to stay fit. This will help you maintain your balance. Ask your health care provider what types of exercise are appropriate for you.  If you need a cane or   walker, use it as recommended by your health care provider.  Wear supportive shoes that have nonskid soles. Lifestyle  Do not drink alcohol if your health care provider tells you not to drink.  If you drink alcohol, limit how much you have: ? 0-1 drink a day for women. ? 0-2 drinks a day for men.  Be aware of how much alcohol is in your drink. In the [place] one drink equals one typical bottle of beer (12  oz), one-half glass of wine (5 oz), or one shot of hard liquor (1 oz).  Do not use any products that contain nicotine or tobacco, such as cigarettes and e-cigarettes. If you need help quitting, ask your health care provider. Summary  Having a healthy lifestyle and getting preventive care can help to protect your health and wellness after age 69.  Screening and testing are the best way to find a health problem early and help you avoid having a fall. Early diagnosis and treatment give you the best chance for managing medical conditions that are more common for people who are older than age 69.  Falls are a major cause of broken bones and head injuries in people who are older than age 69. Take precautions to prevent a fall at home.  Work with your health care provider to learn what changes you can make to improve your health and wellness and to prevent falls. This information is not intended to replace advice given to you by your health care provider. Make sure you discuss any questions you have with your health care provider. Document Revised: 02/19/2019 Document Reviewed: 09/11/2017 Elsevier Patient Education  2020 [employer]vier Inc[employer]  

## 2020-06-28 NOTE — Progress Notes (Signed)
Subjective:    Patient ID: Cassidy Barber, female    DOB: [birth year], 69 y.o.   MRN: [id number]  Chief Complaint  Patient presents with  . Medical Management of Chronic Issues    wants hep A  . Hypertension   PT presents to the office today for chronic follow up. She is followed by [facility] for hx of liver transplant. She had an incarcerated ventral hernia with fluid In the hernia sac 04/05/18. She is followed by Hemologists every 3 months for iron deficiency anemia.   She had a left reverse shoulder 07/17/18 and then had to redone in 09/27/19.She currently has skin cancer on her scalp and is completed 30 radiation treatments.  Anxiety Presents for follow-up visit. Symptoms include depressed mood, excessive worry, irritability, nervous/anxious behavior and restlessness. Patient reports no shortness of breath. Symptoms occur occasionally. The severity of symptoms is moderate. The quality of sleep is good.    Hypertension This is a chronic problem. The current episode started more than 1 year ago. The problem has been resolved since onset. The problem is controlled. Associated symptoms include anxiety and malaise/fatigue. Pertinent negatives include no peripheral edema or shortness of breath. Risk factors for coronary artery disease include dyslipidemia, obesity and sedentary lifestyle. The current treatment provides moderate improvement. There is no history of kidney disease, CVA or heart failure. Identifiable causes of hypertension include a thyroid problem.  Gastroesophageal Reflux She complains of belching and heartburn. This is a chronic problem. The current episode started more than 1 year ago. The problem occurs rarely. The problem has been resolved. Risk factors include obesity. She has tried a PPI for the symptoms. The treatment provided moderate relief.  Thyroid Problem Presents for follow-up visit. Symptoms include anxiety, depressed mood and dry skin. Patient reports no constipation or  diarrhea. The symptoms have been stable. There is no history of heart failure.  Arthritis Presents for follow-up visit. She complains of pain. The symptoms have been stable. Affected locations include the right knee, left knee and left MCP. Her pain is at a severity of 8/10. Pertinent negatives include no diarrhea.      Review of Systems  Constitutional: Positive for irritability and malaise/fatigue.  Respiratory: Negative for shortness of breath.   Gastrointestinal: Positive for heartburn. Negative for constipation and diarrhea.  Musculoskeletal: Positive for arthritis.  Psychiatric/Behavioral: The patient is nervous/anxious.   All other systems reviewed and are negative.      Objective:   Physical Exam Vitals reviewed.  Constitutional:      General: She is not in acute distress.    Appearance: She is well-developed.  HENT:     Head: Normocephalic and atraumatic.     Right Ear: Tympanic membrane normal.     Left Ear: Tympanic membrane normal.  Eyes:     Pupils: Pupils are equal, round, and reactive to light.  Neck:     Thyroid: No thyromegaly.  Cardiovascular:     Rate and Rhythm: Normal rate and regular rhythm.     Heart sounds: Normal heart sounds. No murmur heard.   Pulmonary:     Effort: Pulmonary effort is normal. No respiratory distress.     Breath sounds: Normal breath sounds. No wheezing.  Abdominal:     General: Bowel sounds are normal. There is no distension.     Palpations: Abdomen is soft.     Tenderness: There is no abdominal tenderness.  Musculoskeletal:        General: No tenderness. Normal range  of motion.     Cervical back: Normal range of motion and neck supple.  Skin:    General: Skin is warm and dry.  Neurological:     Mental Status: She is alert and oriented to person, place, and time.     Cranial Nerves: No cranial nerve deficit.     Deep Tendon Reflexes: Reflexes are normal and symmetric.  Psychiatric:        Behavior: Behavior normal.         Thought Content: Thought content normal.        Judgment: Judgment normal.       BP 138/68   Pulse (!) 52   Temp (!) 97.3 F (36.3 C) (Temporal)   Ht _0  (1.626 m)   Wt 141 lb 9.6 oz (64.2 kg)   SpO2 99%   BMI 24.31 kg/m      Assessment & Plan:  [name] comes in today with chief complaint of Medical Management of Chronic Issues (wants hep A) and Hypertension   Diagnosis and orders addressed:  1. Essential hypertension, benign - CMP14+EGFR - CBC with Differential/Platelet  2. Gastroesophageal reflux disease, unspecified whether esophagitis present - CMP14+EGFR - CBC with Differential/Platelet  3. Acquired hypothyroidism - CMP14+EGFR - CBC with Differential/Platelet  4. Anemia of chronic disease - CMP14+EGFR - CBC with Differential/Platelet - Thyroid Panel With TSH  5. Benzodiazepine dependence (HCC) - CMP14+EGFR - CBC with Differential/Platelet  6. Controlled substance agreement signed - CMP14+EGFR - CBC with Differential/Platelet  7. GAD (generalized anxiety disorder) - CMP14+EGFR - CBC with Differential/Platelet  8. Hx of liver transplant (Cassidy Barber) - CMP14+EGFR - CBC with Differential/Platelet  9. Insomnia, unspecified type - CMP14+EGFR - CBC with Differential/Platelet  10. Vitamin D deficiency - CMP14+EGFR - CBC with Differential/Platelet   Labs pending Patient reviewed in [place] controlled database, no flags noted. Contract and drug screen are up to date.  Health Maintenance reviewed Diet and exercise encouraged  Follow up plan: 6 months    [name], FNP

## 2020-06-29 LAB — CBC WITH DIFFERENTIAL/PLATELET
Basophils Absolute: 0.2 10*3/uL (ref 0.0–0.2)
Basos: 2 %
EOS (ABSOLUTE): 0.7 10*3/uL — ABNORMAL HIGH (ref 0.0–0.4)
Eos: 9 %
Hematocrit: 36.8 % (ref 34.0–46.6)
Hemoglobin: 12 g/dL (ref 11.1–15.9)
Immature Grans (Abs): 0 10*3/uL (ref 0.0–0.1)
Immature Granulocytes: 0 %
Lymphocytes Absolute: 1.9 10*3/uL (ref 0.7–3.1)
Lymphs: 25 %
MCH: 28.2 pg (ref 26.6–33.0)
MCHC: 32.6 g/dL (ref 31.5–35.7)
MCV: 86 fL (ref 79–97)
Monocytes Absolute: 0.9 10*3/uL (ref 0.1–0.9)
Monocytes: 12 %
Neutrophils Absolute: 3.8 10*3/uL (ref 1.4–7.0)
Neutrophils: 52 %
Platelets: 297 10*3/uL (ref 150–450)
RBC: 4.26 x10E6/uL (ref 3.77–5.28)
RDW: 12.3 % (ref 11.7–15.4)
WBC: 7.5 10*3/uL (ref 3.4–10.8)

## 2020-06-29 LAB — THYROID PANEL WITH TSH
Free Thyroxine Index: 2.9 (ref 1.2–4.9)
T3 Uptake Ratio: 31 % (ref 24–39)
T4, Total: 9.2 ug/dL (ref 4.5–12.0)
TSH: 0.222 u[IU]/mL — ABNORMAL LOW (ref 0.450–4.500)

## 2020-06-29 LAB — CMP14+EGFR
ALT: 12 IU/L (ref 0–32)
AST: 25 IU/L (ref 0–40)
Albumin/Globulin Ratio: 1.4 (ref 1.2–2.2)
Albumin: 3.9 g/dL (ref 3.8–4.8)
Alkaline Phosphatase: 124 IU/L — ABNORMAL HIGH (ref 48–121)
BUN/Creatinine Ratio: 14 (ref 12–28)
BUN: 13 mg/dL (ref 8–27)
Bilirubin Total: <0.2 mg/dL (ref 0.0–1.2)
CO2: 23 mmol/L (ref 20–29)
Calcium: 8.8 mg/dL (ref 8.7–10.3)
Chloride: 102 mmol/L (ref 96–106)
Creatinine, Ser: 0.94 mg/dL (ref 0.57–1.00)
GFR calc Af Amer: 72 mL/min/{1.73_m2} (ref >59)
GFR calc non Af Amer: 62 mL/min/{1.73_m2} (ref >59)
Globulin, Total: 2.7 g/dL (ref 1.5–4.5)
Glucose: 87 mg/dL (ref 65–99)
Potassium: 4.9 mmol/L (ref 3.5–5.2)
Sodium: 138 mmol/L (ref 134–144)
Total Protein: 6.6 g/dL (ref 6.0–8.5)

## 2020-06-30 ENCOUNTER — Other Ambulatory Visit: Payer: Self-pay | Admitting: Family

## 2020-06-30 LAB — TOXASSURE SELECT 13 (MW), URINE

## 2020-06-30 MED ORDER — LEVOTHYROXINE SODIUM 112 MCG PO TABS
112.0000 ug | ORAL_TABLET | Freq: Every day | ORAL | 11 refills | Status: DC
Start: 2020-06-30 — End: 2021-01-06

## 2020-07-13 ENCOUNTER — Other Ambulatory Visit: Payer: Self-pay

## 2020-07-13 ENCOUNTER — Ambulatory Visit: Payer: Medicare Other

## 2020-07-13 ENCOUNTER — Encounter: Payer: Self-pay | Admitting: *Deleted

## 2020-07-13 DIAGNOSIS — Z23 Encounter for immunization: Secondary | ICD-10-CM

## 2020-07-13 NOTE — Progress Notes (Signed)
° °  Covid-19 Vaccination Clinic  Name:  Cassidy Barber    MRN: [id number] DOB: [birth year]  07/13/2020  [name] was observed post Covid-19 immunization for 15 minutes without incident. She was provided with Vaccine Information Sheet and instruction to access the V-Safe system.   [name] was instructed to call 911 with any severe reactions post vaccine:  Difficulty breathing   Swelling of face and throat   A fast heartbeat   A bad rash all over body   Dizziness and weakness

## 2020-07-20 DIAGNOSIS — Z944 Liver transplant status: Secondary | ICD-10-CM | POA: Diagnosis not present

## 2020-07-25 ENCOUNTER — Other Ambulatory Visit: Payer: Self-pay

## 2020-07-25 ENCOUNTER — Inpatient Hospital Stay (HOSPITAL_COMMUNITY): Payer: Medicare Other | Attending: Hematology

## 2020-07-25 DIAGNOSIS — D509 Iron deficiency anemia, unspecified: Secondary | ICD-10-CM | POA: Insufficient documentation

## 2020-07-25 DIAGNOSIS — D508 Other iron deficiency anemias: Secondary | ICD-10-CM

## 2020-07-25 LAB — CBC WITH DIFFERENTIAL/PLATELET
Abs Immature Granulocytes: 0.01 10*3/uL (ref 0.00–0.07)
Basophils Absolute: 0.2 10*3/uL — ABNORMAL HIGH (ref 0.0–0.1)
Basophils Relative: 2 %
Eosinophils Absolute: 0.5 10*3/uL (ref 0.0–0.5)
Eosinophils Relative: 7 %
HCT: 35.7 % — ABNORMAL LOW (ref 36.0–46.0)
Hemoglobin: 10.8 g/dL — ABNORMAL LOW (ref 12.0–15.0)
Immature Granulocytes: 0 %
Lymphocytes Relative: 25 %
Lymphs Abs: 1.8 10*3/uL (ref 0.7–4.0)
MCH: 27.7 pg (ref 26.0–34.0)
MCHC: 30.3 g/dL (ref 30.0–36.0)
MCV: 91.5 fL (ref 80.0–100.0)
Monocytes Absolute: 1 10*3/uL (ref 0.1–1.0)
Monocytes Relative: 13 %
Neutro Abs: 3.7 10*3/uL (ref 1.7–7.7)
Neutrophils Relative %: 53 %
Platelets: 339 10*3/uL (ref 150–400)
RBC: 3.9 MIL/uL (ref 3.87–5.11)
RDW: 14.1 % (ref 11.5–15.5)
WBC: 7.1 10*3/uL (ref 4.0–10.5)
nRBC: 0 % (ref 0.0–0.2)

## 2020-07-25 LAB — IRON AND TIBC
Iron: 29 ug/dL (ref 28–170)
Saturation Ratios: 10 % — ABNORMAL LOW (ref 10.4–31.8)
TIBC: 294 ug/dL (ref 250–450)
UIBC: 265 ug/dL

## 2020-07-25 LAB — VITAMIN B12: Vitamin B-12: 2110 pg/mL — ABNORMAL HIGH (ref 180–914)

## 2020-07-25 LAB — COMPREHENSIVE METABOLIC PANEL
ALT: 14 U/L (ref 0–44)
AST: 23 U/L (ref 15–41)
Albumin: 3.1 g/dL — ABNORMAL LOW (ref 3.5–5.0)
Alkaline Phosphatase: 81 U/L (ref 38–126)
Anion gap: 9 (ref 5–15)
BUN: 17 mg/dL (ref 8–23)
CO2: 26 mmol/L (ref 22–32)
Calcium: 8.7 mg/dL — ABNORMAL LOW (ref 8.9–10.3)
Chloride: 101 mmol/L (ref 98–111)
Creatinine, Ser: 1.09 mg/dL — ABNORMAL HIGH (ref 0.44–1.00)
GFR calc Af Amer: 60 mL/min — ABNORMAL LOW (ref >60)
GFR calc non Af Amer: 52 mL/min — ABNORMAL LOW (ref >60)
Glucose, Bld: 110 mg/dL — ABNORMAL HIGH (ref 70–99)
Potassium: 4.2 mmol/L (ref 3.5–5.1)
Sodium: 136 mmol/L (ref 135–145)
Total Bilirubin: 0.5 mg/dL (ref 0.3–1.2)
Total Protein: 6.6 g/dL (ref 6.5–8.1)

## 2020-07-25 LAB — FERRITIN: Ferritin: 17 ng/mL (ref 11–307)

## 2020-07-25 LAB — LACTATE DEHYDROGENASE: LDH: 213 U/L — ABNORMAL HIGH (ref 98–192)

## 2020-07-25 LAB — VITAMIN D 25 HYDROXY (VIT D DEFICIENCY, FRACTURES): Vit D, 25-Hydroxy: 91.71 ng/mL (ref 30–100)

## 2020-07-26 ENCOUNTER — Inpatient Hospital Stay (HOSPITAL_BASED_OUTPATIENT_CLINIC_OR_DEPARTMENT_OTHER): Payer: Medicare Other | Admitting: Nurse Practitioner

## 2020-07-26 ENCOUNTER — Other Ambulatory Visit: Payer: Self-pay

## 2020-07-26 DIAGNOSIS — D508 Other iron deficiency anemias: Secondary | ICD-10-CM | POA: Diagnosis not present

## 2020-07-26 NOTE — Assessment & Plan Note (Signed)
1. Iron deficiency anemia: -She was unable to tolerate oral iron therapy in the past. -Last received Feraheme on 12/10/2019 and 12/18/2019 -She denies any bleeding per rectum or melena. -She had a colonoscopy in the past secondary to Lynch syndrome. -Last visit patient's hemoglobin was 7.4.  Due to the patient's significant fatigue she received 1 unit of packed red blood cells.  Then 2 infusions of iron. -She is scheduled for an ERCP at [place] done on 07/25/2020 showed hemoglobin 10.8, ferritin 17, percent saturation 10. -We will set her up with 2 iron infusions -We will see her back in 3 months with repeat labs.  2.  Thrombocytosis: -She was initially worked up for thrombocytosis with negative JAK2 mutations and BCR/ABL. -This is likely reactive to iron deficiency anemia. -Labs done on 07/25/2020 showed platelets 339 -They have normalized since we have replaced her iron.  3.  Primary biliary cirrhosis: -She had orthotopic liver transplant in 2015 at [facility]. -She is currently on sirolimus, ursodiol, valtrex.  4.  Osteoporosis: -She is on Fosamax 70 mg weekly.  5.  Skin cancer: -Patient reports she had a skin cancer removed off the top of her head. -She will be getting 5 weeks of radiation therapy.

## 2020-07-26 NOTE — Progress Notes (Signed)
[facility] up:    [name], [place] [address]   DIAGNOSIS: Iron deficiency anemia  CURRENT THERAPY: Intermittent iron infusions  INTERVAL HISTORY: Cassidy Barber 69 y.o. female was called for a telephone visit for iron deficiency anemia.  Patient reports she is feeling more fatigued than normal.  She reports she can feel that her iron levels are low.  She denies any bright red bleeding per rectum or melena.  She denies any easy bruising or bleeding. Denies any nausea, vomiting, or diarrhea. Denies any new pains. Had not noticed any recent bleeding such as epistaxis, hematuria or hematochezia. Denies recent chest pain on exertion, shortness of breath on minimal exertion, pre-syncopal episodes, or palpitations. Denies any numbness or tingling in hands or feet. Denies any recent fevers, infections, or recent hospitalizations. Patient reports appetite at 100% and energy level at 75%.  She is eating well maintain her weight this time.    Patient Active Problem List   Diagnosis Date Noted   S/P reverse total shoulder arthroplasty, left 11/12/2019   Anterior dislocation of left shoulder 09/26/2019   Hx of colonic polyps 09/04/2019   Chest pain 09/04/2019   Benzodiazepine dependence ([place]) 04/13/2019   Controlled substance agreement signed 04/13/2019   Urticaria 09/08/2018   S/p reverse total shoulder arthroplasty 07/17/2018   History of colon cancer 06/12/2018   History of colonic polyps 06/12/2018   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   Gastroesophageal reflux disease 06/12/2018   Chronic diarrhea 06/26/2017   Iron deficiency anemia 06/26/2017   GAD (generalized anxiety disorder) 08/28/2016   Vitamin D deficiency 08/28/2016   Insomnia 08/28/2016   Convulsions ([place]) 11/30/2015   Liver transplant recipient [facility]) 11/30/2015   Seizures ([place]) 09/27/2015   Essential hypertension, benign 03/10/2015   Hx of liver  transplant ([place]) 12/08/2014   Diarrhea 12/08/2014   Colon cancer ([place]) 12/08/2014   Hepatic encephalopathy ([place]) 10/26/2013   Anemia of chronic disease 10/26/2013   Personal history of colonic polyps 12/27/2012   PBC (primary biliary cirrhosis) 03/24/2012   Hypothyroidism 03/24/2012   Ventral hernia, recurrent 03/24/2012    is allergic to ciprofloxacin and codeine.  MEDICAL HISTORY: Past Medical History:  Diagnosis Date   Abdominal wall hernia    Incarcerated status post surgical repair 2019 - [facility]   Anemia of chronic disease    Atypical nevus 01/21/2018   atypical neoplasm- Left scalp-ant (txpbx + MOHS), atypical neoplasm- Left scalp post- (txpbx + MOHS)   Colon cancer ([place])    Colon surgery 2005 and 2012   History of pulmonary hypertension    Pre liver transplant   History of seizures    Hypertension    Hypothyroidism    Lynch syndrome    Osteopenia    Primary biliary cirrhosis ([place])    Status post liver transplantation - follows at [facility]   SCCA (squamous cell carcinoma) of skin 02/23/2020   Right Upper Chest(moderate) (MOH's)   SCCA (squamous cell carcinoma) of skin 04/07/2020   Left Top Leg (Keratoacanthoma) treatment after biopsy   SCCA (squamous cell carcinoma) of skin 04/07/2020   Left Foot Dorsal (in situ) treatment after biopsy   Squamous cell carcinoma of skin 04/22/2018   KA-Right mid chest (txpbx), KA-left elbow crease (txpbx), insitu-Right mid chest inf. (exc)   Squamous cell carcinoma of skin 05/20/2018   well diff-Left upper shin (txpbx), well diff-Right lower forearm (txpbx), well diff-Right upper shin (txpbx)   Squamous cell carcinoma  of skin 06/11/2018   Scc + margin-Right mid chest inferior    Squamous cell carcinoma of skin 06/27/2018   well diff-Left mid thigh(txpbx), well diff-Left inner thigh (txpbx),insitu-Right cheek (txpbx),well diff-right inner heel (txpbx)   Squamous cell carcinoma of skin 09/16/2018   well diff-left  shoulder (txpbx), well diff-Right chin (txpbx), well diff-right chest lateral (CX35FU)   Squamous cell carcinoma of skin 10/01/2018   Right outer lower shin (Txpbx)   Squamous cell carcinoma of skin 04/03/2019   well diff-Right center chest (MOHS), in situ-Right ear   Squamous cell carcinoma of skin 08/05/2019   in situ-Left calf (txpbx), in situ-left bicep (txpbx), well diff-Left chest,inf(txpbx), in situ-Right chest inf-(txpbx)   Squamous cell carcinoma of skin 11/19/2019   KA-left top leg (txpbx), modify-Riight forehead-(mohs), in situ-right hand (txpbx), in situ-Right forearm (txpbx), well diff-Right chest (txpbx), well diff-chin (txpbx)   Squamous cell carcinoma of skin 01/06/2020   KA- Left top leg   Squamous cell carcinoma of skin 05/12/2013   bowens-middle of chest (CX35FU)   Squamous cell carcinoma of skin 05/18/2015   well diff-Left upper arm (CX35FU + Exc),KA-right chest(txpbx), in situ-Left shin (txpbx), well diff-Right cheek (CX35FU), KA-Left post scalp (CX35FU)   Squamous cell carcinoma of skin 08/09/2015   KA-Left post scalp ((MOHS), in situ- mid chest (Txpbx +exc), in situ-Left upper arm inferior (txpbx)   Squamous cell carcinoma of skin 10/13/2015   Left upper arm-clear   Squamous cell carcinoma of skin 03/09/2016   mod diff-mid chest (txpbx+ exc), mod diff-Right chest (txpbx+exc), well diff-right cheek-(txpbx),well diff-Left hand-(txpbx), in situ-Left upper arm (txpbx), well diff-Right cheek -(txpbx), well diff-Right crease arm (txpbx)    Squamous cell carcinoma of skin 05/24/2016   well diff-Right nasal crease-(MOHS)   Squamous cell carcinoma of skin 08/02/2016   KA-Left chest med (txpbx)   Squamous cell carcinoma of skin 08/30/2016   in situ-Left outer zygoma (txpbx)   Squamous cell carcinoma of skin 12/06/2016   well diff-Left chest sup, Left shoudler, insitu- right post scalp   Squamous cell carcinoma of skin 02/14/2017   well diff-Left forearm  (EXC),in situ-RIght ant neck   Squamous cell carcinoma of skin 06/14/2017   in situ-Right forearm (txpbx), in situ-Right chest (txpbx), well diff-left chest (txpbx), well diff-anterior neck- (txpbx)   Squamous cell carcinoma of skin 08/07/2017   well diff-Left upper shoulder (txpbx), sup and invasive-Left temple (txpbx), well diff-Right upper shin (txpbx), in situ-Right clavicle (txpbx)   Squamous cell carcinoma of skin 10/17/2017   well diff-ant. neck (MOHS), in situ-Right chest, inf (txpbx)   Squamous cell carcinoma of skin 01/21/2018   well diff- Right chest,ulnar (txpbx), well diff- right upper chest (txpbx), in situ-Right ant. crown (txpbx)    SURGICAL HISTORY: Past Surgical History:  Procedure Laterality Date   ABDOMINAL HERNIA REPAIR     Patient's states that she has had 8- 9 hernia surgeries   ABDOMINAL HYSTERECTOMY     CATARACT EXTRACTION W/PHACO Right 01/15/2020   Procedure: CATARACT EXTRACTION PHACO AND INTRAOCULAR LENS PLACEMENT ([place]);  Surgeon: Baruch Goldmann, MD;  Location: [facility];  Service: Ophthalmology;  Laterality: Right;  CDE: 7.89   CATARACT EXTRACTION W/PHACO Left 01/29/2020   Procedure: CATARACT EXTRACTION PHACO AND INTRAOCULAR LENS PLACEMENT (IOC) (CDE: 6.33);  Surgeon: Baruch Goldmann, MD;  Location: [facility];  Service: Ophthalmology;  Laterality: Left;   CHOLECYSTECTOMY  2007   COLON RESECTION     COLON SURGERY  2008   Done at [facility]   COLONOSCOPY  Done at [facility]   ESOPHAGOGASTRODUODENOSCOPY N/A 08/21/2018   Procedure: ESOPHAGOGASTRODUODENOSCOPY (EGD);  Surgeon: Rogene Houston, MD;  [facility];  Service: Endoscopy;  Laterality: N/A;   ESOPHAGOGASTRODUODENOSCOPY (EGD) WITH PROPOFOL N/A 12/16/2019   Procedure: ESOPHAGOGASTRODUODENOSCOPY (EGD) WITH PROPOFOL;  Surgeon: Rogene Houston, MD;  [facility];  Service: Endoscopy;  Laterality: N/A;   EYE SURGERY     lasix   FLEXIBLE SIGMOIDOSCOPY N/A 10/20/2015   Procedure: FLEXIBLE  SIGMOIDOSCOPY;  Surgeon: Rogene Houston, MD;  [facility];  Service: Endoscopy;  Laterality: N/A;  70 - Dr Laural Golden has meeting until 1:00   FLEXIBLE SIGMOIDOSCOPY N/A 07/11/2016   Procedure: FLEXIBLE SIGMOIDOSCOPY;  Surgeon: Rogene Houston, MD;  [facility];  Service: Endoscopy;  Laterality: N/A;  [place] N/A 08/09/2017   Procedure: FLEXIBLE SIGMOIDOSCOPY;  Surgeon: Rogene Houston, MD;  [facility];  Service: Endoscopy;  Laterality: N/A;  1:00   FLEXIBLE SIGMOIDOSCOPY N/A 08/21/2018   Procedure: FLEXIBLE SIGMOIDOSCOPY;  Surgeon: Rogene Houston, MD;  [facility];  Service: Endoscopy;  Laterality: N/A;   FLEXIBLE SIGMOIDOSCOPY N/A 12/16/2019   Procedure: FLEXIBLE SIGMOIDOSCOPY wirh Propofol;  Surgeon: Rogene Houston, MD;  [facility];  Service: Endoscopy;  Laterality: N/A;  7:30   FRACTURE SURGERY     right wrist metal plate   HERNIA REPAIR     LIVER TRANSPLANT  [id number]   multiple skin cancers removed     POLYPECTOMY  08/09/2017   Procedure: POLYPECTOMY;  Surgeon: Rogene Houston, MD;  Location[facility];  Service: Endoscopy;;  colon small bowel   REVERSE SHOULDER ARTHROPLASTY Left 07/17/2018   REVERSE SHOULDER ARTHROPLASTY Left 07/17/2018   Procedure: LEFT REVERSE SHOULDER ARTHROPLASTY;  Surgeon: Justice Britain, MD;  Location: [place];  Service: Orthopedics;  Laterality: Left;  147min   SHOULDER CLOSED REDUCTION Left 09/27/2019   Procedure: CLOSED REDUCTION SHOULDER;  Surgeon: Paralee Cancel, MD;  Location: [facility];  Service: Orthopedics;  Laterality: Left;   SPLENECTOMY  2006   TOTAL SHOULDER REVISION Left 11/12/2019   Procedure: Revision Left Reverse Shoulder Arthroplasty with poly exchange SDD;  Surgeon: Justice Britain, MD;  Location: [facility];  Service: Orthopedics;  Laterality: Left;  136min -SDDC   TYMPANOSTOMY TUBE PLACEMENT     UPPER GASTROINTESTINAL ENDOSCOPY     Done at [facility]     SOCIAL HISTORY: Social History   Socioeconomic History   Marital status: Married    Spouse name: Johnny   Number of children: 1   Years of education: 12   Highest education level: High school graduate  Occupational History   Occupation: Disability    Employer: [employer]    Comment: Multimedia programmer  Tobacco Use   Smoking status: Never Smoker   Smokeless tobacco: Never Used  Scientific laboratory technician Use: Never used  Substance and Sexual Activity   Alcohol use: No    Alcohol/week: 0.0 standard drinks   Drug use: No   Sexual activity: Yes    Birth control/protection: None  Other Topics Concern   Not on file  Social History Narrative   Patient lives in a two story home with her husband. She has an adult son. She is retired from being an Web designer for 30 years.    Social Determinants of Health   Financial Resource Strain: Low Risk    Difficulty of Paying Living Expenses: Not hard at  all  Food Insecurity: No Food Insecurity   Worried About Charity fundraiser in the Last Year: Never true   Ran Out of Food in the Last Year: Never true  Transportation Needs: No Transportation Needs   Lack of Transportation (Medical): No   Lack of Transportation (Non-Medical): No  Physical Activity: Sufficiently Active   Days of Exercise per Week: 7 days   Minutes of Exercise per Session: 30 min  Stress: No Stress Concern Present   Feeling of Stress : Not at all  Social Connections: Socially Integrated   Frequency of Communication with Friends and Family: More than three times a week   Frequency of Social Gatherings with Friends and Family: More than three times a week   Attends Religious Services: More than 4 times per year   Active Member of [employer] or Organizations: Yes   Attends Music therapist: More than 4 times per year   Marital Status: Married  Human resources officer Violence: Not At Risk   Fear of Current or Ex-Partner: No    Emotionally Abused: No   Physically Abused: No   Sexually Abused: No    FAMILY HISTORY: Family History  Problem Relation Age of Onset   Prostate cancer Father    Colon cancer Father    Colon cancer Sister    Lung cancer Sister    Healthy Son    Alcohol abuse Brother    Allergic rhinitis Neg Hx    Asthma Neg Hx    Eczema Neg Hx    Urticaria Neg Hx     Review of Systems  Constitutional: Positive for fatigue.  All other systems reviewed and are negative.    Vital signs: -Deferred due to telephone visit  Physical Exam -Deferred due to telephone visit -Patient was alert and oriented over the phone in no acute distress.   LABORATORY DATA:  CBC    Component Value Date/Time   WBC 7.1 07/25/2020 1035   RBC 3.90 07/25/2020 1035   HGB 10.8 (L) 07/25/2020 1035   HGB 12.0 06/28/2020 1146   HCT 35.7 (L) 07/25/2020 1035   HCT 36.8 06/28/2020 1146   PLT 339 07/25/2020 1035   PLT 297 06/28/2020 1146   MCV 91.5 07/25/2020 1035   MCV 86 06/28/2020 1146   MCH 27.7 07/25/2020 1035   MCHC 30.3 07/25/2020 1035   RDW 14.1 07/25/2020 1035   RDW 12.3 06/28/2020 1146   LYMPHSABS 1.8 07/25/2020 1035   LYMPHSABS 1.9 06/28/2020 1146   MONOABS 1.0 07/25/2020 1035   EOSABS 0.5 07/25/2020 1035   EOSABS 0.7 (H) 06/28/2020 1146   BASOSABS 0.2 (H) 07/25/2020 1035   BASOSABS 0.2 06/28/2020 1146    CMP     Component Value Date/Time   NA 136 07/25/2020 1035   NA 138 06/28/2020 1146   K 4.2 07/25/2020 1035   CL 101 07/25/2020 1035   CO2 26 07/25/2020 1035   GLUCOSE 110 (H) 07/25/2020 1035   BUN 17 07/25/2020 1035   BUN 13 06/28/2020 1146   CREATININE 1.09 (H) 07/25/2020 1035   CREATININE 0.62 12/23/2012 1140   CALCIUM 8.7 (L) 07/25/2020 1035   PROT 6.6 07/25/2020 1035   PROT 6.6 06/28/2020 1146   ALBUMIN 3.1 (L) 07/25/2020 1035   ALBUMIN 3.9 06/28/2020 1146   AST 23 07/25/2020 1035   ALT 14 07/25/2020 1035   ALKPHOS 81 07/25/2020 1035   BILITOT 0.5 07/25/2020  1035   BILITOT <0.2 06/28/2020 1146   GFRNONAA 52 (  L) 07/25/2020 1035   GFRAA 60 (L) 07/25/2020 1035    All questions were answered to patient's stated satisfaction. Encouraged patient to call with any new concerns or questions before his next visit to the cancer center and we can certain see him sooner, if needed.     ASSESSMENT and THERAPY PLAN:   Iron deficiency anemia 1. Iron deficiency anemia: -She was unable to tolerate oral iron therapy in the past. -Last received Feraheme on 12/10/2019 and 12/18/2019 -She denies any bleeding per rectum or melena. -She had a colonoscopy in the past secondary to Lynch syndrome. -Last visit patient's hemoglobin was 7.4.  Due to the patient's significant fatigue she received 1 unit of packed red blood cells.  Then 2 infusions of iron. -She is scheduled for an ERCP at [facility] done on 07/25/2020 showed hemoglobin 10.8, ferritin 17, percent saturation 10. -We will set her up with 2 iron infusions -We will see her back in 3 months with repeat labs.  2.  Thrombocytosis: -She was initially worked up for thrombocytosis with negative JAK2 mutations and BCR/ABL. -This is likely reactive to iron deficiency anemia. -Labs done on 07/25/2020 showed platelets 339 -They have normalized since we have replaced her iron.  3.  Primary biliary cirrhosis: -She had orthotopic liver transplant in 2015 at [facility]. -She is currently on sirolimus, ursodiol, valtrex.  4.  Osteoporosis: -She is on Fosamax 70 mg weekly.  5.  Skin cancer: -Patient reports she had a skin cancer removed off the top of her head. -She will be getting 5 weeks of radiation therapy.   Orders Placed This Encounter  Procedures   CBC with Differential/Platelet    Standing Status:   Future    Standing Expiration Date:   07/26/2021   Comprehensive metabolic panel    Standing Status:   Future    Standing Expiration Date:   07/26/2021   Ferritin     Standing Status:   Future    Standing Expiration Date:   07/26/2021   Iron and TIBC    Standing Status:   Future    Standing Expiration Date:   07/26/2021   Lactate dehydrogenase    Standing Status:   Future    Standing Expiration Date:   07/26/2021   Vitamin B12    Standing Status:   Future    Standing Expiration Date:   07/26/2021   VITAMIN D 25 Hydroxy (Vit-D Deficiency, Fractures)    Standing Status:   Future    Standing Expiration Date:   07/26/2021   Folate    Standing Status:   Future    Standing Expiration Date:   07/26/2021    All questions were answered. The patient knows to call the clinic with any problems, questions or concerns. We can certainly see the patient much sooner if necessary. This note was electronically signed.  I provided 28 minutes of non face-to-face telephone visit time during this encounter, and > 50% was spent counseling as documented under my assessment & plan.   Glennie Isle, NP-C 07/26/2020

## 2020-07-28 ENCOUNTER — Other Ambulatory Visit: Payer: Self-pay | Admitting: Family

## 2020-07-28 DIAGNOSIS — I10 Essential (primary) hypertension: Secondary | ICD-10-CM

## 2020-08-01 ENCOUNTER — Other Ambulatory Visit: Payer: Self-pay

## 2020-08-01 ENCOUNTER — Inpatient Hospital Stay (HOSPITAL_COMMUNITY): Payer: Medicare Other

## 2020-08-01 ENCOUNTER — Encounter (HOSPITAL_COMMUNITY): Payer: Self-pay

## 2020-08-01 VITALS — BP 118/61 | HR 57 | Temp 97.3°F | Resp 18

## 2020-08-01 DIAGNOSIS — D508 Other iron deficiency anemias: Secondary | ICD-10-CM

## 2020-08-01 DIAGNOSIS — D509 Iron deficiency anemia, unspecified: Secondary | ICD-10-CM | POA: Diagnosis not present

## 2020-08-01 MED ORDER — SODIUM CHLORIDE 0.9 % IV SOLN: Freq: Once | INTRAVENOUS | Status: AC

## 2020-08-01 MED ORDER — SODIUM CHLORIDE 0.9 % IV SOLN
510.0000 mg | Freq: Once | INTRAVENOUS | Status: AC
  Administered 2020-08-01: 510 mg via INTRAVENOUS
  Filled 2020-08-01: qty 510

## 2020-08-01 NOTE — Patient Instructions (Signed)
[facility]  Discharge Instructions:   _______________________________________________________________  Thank you for choosing [facility] at [facility] to provide your oncology and hematology care.  To afford each patient quality time with our providers, please arrive at least 15 minutes before your scheduled appointment.  You need to re-schedule your appointment if you arrive 10 or more minutes late.  We strive to give you quality time with our providers, and arriving late affects you and other patients whose appointments are after yours.  Also, if you no show three or more times for appointments you may be dismissed from the clinic.  Again, thank you for choosing [facility] at [facility]. Our hope is that these requests will allow you access to exceptional care and in a timely manner. _______________________________________________________________  If you have questions after your visit, please contact our office at [phone] between the hours of 8:30 a.m. and 5:00 p.m. Voicemails left after 4:30 p.m. will not be returned until the following business day. _______________________________________________________________  For prescription refill requests, have your pharmacy contact our office. _______________________________________________________________  Recommendations made by the consultant and any test results will be sent to your referring physician. _______________________________________________________________ 

## 2020-08-01 NOTE — Progress Notes (Signed)
Feraheme given today per MD orders. Tolerated infusion without adverse affects. Vital signs stable. No complaints at this time. Discharged from clinic ambulatory in stable condition. Alert and oriented x 3. F/U with [facility] as scheduled.  

## 2020-08-02 ENCOUNTER — Ambulatory Visit: Payer: Medicare Other | Admitting: Physician Assistant

## 2020-08-02 ENCOUNTER — Encounter: Payer: Self-pay | Admitting: Physician Assistant

## 2020-08-02 DIAGNOSIS — C4442 Squamous cell carcinoma of skin of scalp and neck: Secondary | ICD-10-CM | POA: Diagnosis not present

## 2020-08-02 DIAGNOSIS — C44722 Squamous cell carcinoma of skin of right lower limb, including hip: Secondary | ICD-10-CM

## 2020-08-02 DIAGNOSIS — C44529 Squamous cell carcinoma of skin of other part of trunk: Secondary | ICD-10-CM | POA: Diagnosis not present

## 2020-08-02 DIAGNOSIS — C44729 Squamous cell carcinoma of skin of left lower limb, including hip: Secondary | ICD-10-CM | POA: Diagnosis not present

## 2020-08-02 DIAGNOSIS — C44622 Squamous cell carcinoma of skin of right upper limb, including shoulder: Secondary | ICD-10-CM

## 2020-08-02 NOTE — Patient Instructions (Signed)

## 2020-08-04 DIAGNOSIS — Z48810 Encounter for surgical aftercare following surgery on the sense organs: Secondary | ICD-10-CM | POA: Diagnosis not present

## 2020-08-04 DIAGNOSIS — H1711 Central corneal opacity, right eye: Secondary | ICD-10-CM | POA: Diagnosis not present

## 2020-08-04 DIAGNOSIS — Z961 Presence of intraocular lens: Secondary | ICD-10-CM | POA: Diagnosis not present

## 2020-08-04 DIAGNOSIS — H43813 Vitreous degeneration, bilateral: Secondary | ICD-10-CM | POA: Diagnosis not present

## 2020-08-08 ENCOUNTER — Encounter: Payer: Self-pay | Admitting: *Deleted

## 2020-08-08 ENCOUNTER — Encounter (HOSPITAL_COMMUNITY): Payer: Self-pay

## 2020-08-08 ENCOUNTER — Inpatient Hospital Stay (HOSPITAL_COMMUNITY): Payer: Medicare Other

## 2020-08-08 ENCOUNTER — Telehealth: Payer: Self-pay | Admitting: *Deleted

## 2020-08-08 ENCOUNTER — Other Ambulatory Visit: Payer: Self-pay

## 2020-08-08 VITALS — BP 114/53 | HR 64 | Temp 97.1°F | Resp 18

## 2020-08-08 DIAGNOSIS — D508 Other iron deficiency anemias: Secondary | ICD-10-CM

## 2020-08-08 DIAGNOSIS — D509 Iron deficiency anemia, unspecified: Secondary | ICD-10-CM | POA: Diagnosis not present

## 2020-08-08 MED ORDER — SODIUM CHLORIDE 0.9 % IV SOLN
510.0000 mg | Freq: Once | INTRAVENOUS | Status: AC
  Administered 2020-08-08: 510 mg via INTRAVENOUS
  Filled 2020-08-08: qty 510

## 2020-08-08 MED ORDER — SODIUM CHLORIDE 0.9 % IV SOLN: Freq: Once | INTRAVENOUS | Status: AC

## 2020-08-08 NOTE — Telephone Encounter (Signed)
pathology to patient,verbal order given to refer patient to  [facility]HS for mid parietal scalp per Robyne Askew; sent referral to [facility].

## 2020-08-08 NOTE — Telephone Encounter (Signed)
-----   Message from [name], [place] sent at 08/08/2020 10:21 AM EDT ----- All lesions were treated at time of visit. Recheck 3 mo

## 2020-08-08 NOTE — Progress Notes (Signed)
Patient presents today for Feraheme infusion. Vital signs are stable. Patient denies any changes since her last treatment. MAR reviewed and updated.   Feraheme given today per MD orders. Tolerated infusion without adverse affects. Vital signs stable. No complaints at this time. Discharged from clinic ambulatory in stable condition. Alert and oriented x 3. F/U with [facility] as scheduled.

## 2020-08-08 NOTE — Progress Notes (Addendum)
Follow-Up Visit   Subjective  Cassidy Barber is a 69 y.o. female who presents for the following: Follow-up (LEFT SHIN, RIGHT SHIN, TOP OF SCALP, LEFT CHEST TO CHECK TODAY.).   The following portions of the chart were reviewed this encounter and updated as appropriate: Tobacco  Allergies  Meds  Problems  Med Hx  Surg Hx  Fam Hx      Objective  Well appearing patient in no apparent distress; mood and affect are within normal limits.  A full examination was performed including scalp, head, eyes, ears, nose, lips, neck, chest, axillae, abdomen, back, buttocks, bilateral upper extremities, bilateral lower extremities, hands, feet, fingers, toes, fingernails, and toenails. All findings within normal limits unless otherwise noted below.  Objective  Left Lower Leg - inferior: Hyperkeratotic scale with pink base  Txpbx- curet & cautery 1.1cm       Objective  Right Lower Leg - mid: Hyperkeratotic scale with pink base  Txpbx- curet & cautyer 1.6cm     Objective  Left Lower Leg - medial: Hyperkeratotic scale with pink base  Txpbx- curet & cautery 1.3cm       Objective  Left Lower Leg - Anterior: Hyperkeratotic scale with pink base  Txpbx-curet & cautery 1.3cm       Objective  Right Forearm - Posterior: Hyperkeratotic scale with pink base  Txpbx-curet & cautery  0.8cm  Objective  Right foot- inner: Hyperkeratotic scale with pink base  Txpbx-curet & cautery 1.0cm     Objective  Mid Parietal Scalp: Hyperkeratotic scale with pink base  1.3cm     Objective  Left chest upper: Hyperkeratotic scale with pink base  Txpbx-curet & cautery 0.8cm     Assessment & Plan  SCC (squamous cell carcinoma), leg, left (4) Left Lower Leg - inferior  Skin / nail biopsy Type of biopsy: tangential   Informed consent: discussed and consent obtained   Timeout: patient name, date of birth, surgical site, and procedure verified   Anesthesia: the lesion  was anesthetized in a standard fashion   Anesthetic:  1% lidocaine w/ epinephrine 1-100,000 local infiltration Instrument used: flexible razor blade   Hemostasis achieved with: aluminum chloride and electrodesiccation   Outcome: patient tolerated procedure well   Post-procedure details: wound care instructions given    Destruction of lesion Complexity: simple   Destruction method: electrodesiccation and curettage   Informed consent: discussed and consent obtained   Timeout:  patient name, date of birth, surgical site, and procedure verified Anesthesia: the lesion was anesthetized in a standard fashion   Anesthetic:  1% lidocaine w/ epinephrine 1-100,000 local infiltration Curettage performed in three different directions: Yes   Electrodesiccation performed over the curetted area: Yes   Curettage cycles:  3 Margin per side (cm):  0.1 Final wound size (cm):  1.4 Hemostasis achieved with:  aluminum chloride Outcome: patient tolerated procedure well with no complications   Post-procedure details: wound care instructions given    Specimen 1 - Surgical pathology Differential Diagnosis: bcc vs scc Check Margins: yes  Right Lower Leg - mid  Skin / nail biopsy Type of biopsy: tangential   Informed consent: discussed and consent obtained   Timeout: patient name, date of birth, surgical site, and procedure verified   Anesthesia: the lesion was anesthetized in a standard fashion   Anesthetic:  1% lidocaine w/ epinephrine 1-100,000 local infiltration Instrument used: flexible razor blade   Hemostasis achieved with: aluminum chloride and electrodesiccation   Outcome: patient tolerated procedure well  Post-procedure details: wound care instructions given    Destruction of lesion Complexity: simple   Destruction method: electrodesiccation and curettage   Informed consent: discussed and consent obtained   Timeout:  patient name, date of birth, surgical site, and procedure  verified Anesthesia: the lesion was anesthetized in a standard fashion   Anesthetic:  1% lidocaine w/ epinephrine 1-100,000 local infiltration Curettage performed in three different directions: Yes   Electrodesiccation performed over the curetted area: Yes   Curettage cycles:  3 Margin per side (cm):  0.1 Final wound size (cm):  1.6 Hemostasis achieved with:  aluminum chloride Outcome: patient tolerated procedure well with no complications   Post-procedure details: wound care instructions given    Specimen 2 - Surgical pathology Differential Diagnosis: bcc vs scc Check Margins: yes  Left Lower Leg - medial  Skin / nail biopsy Type of biopsy: tangential   Informed consent: discussed and consent obtained   Timeout: patient name, date of birth, surgical site, and procedure verified   Anesthesia: the lesion was anesthetized in a standard fashion   Anesthetic:  1% lidocaine w/ epinephrine 1-100,000 local infiltration Hemostasis achieved with: aluminum chloride and electrodesiccation   Outcome: patient tolerated procedure well   Post-procedure details: wound care instructions given    Destruction of lesion Complexity: simple   Destruction method: electrodesiccation and curettage   Informed consent: discussed and consent obtained   Timeout:  patient name, date of birth, surgical site, and procedure verified Anesthesia: the lesion was anesthetized in a standard fashion   Anesthetic:  1% lidocaine w/ epinephrine 1-100,000 local infiltration Curettage performed in three different directions: Yes   Electrodesiccation performed over the curetted area: Yes   Curettage cycles:  3 Margin per side (cm):  0.1 Final wound size (cm):  1.3 Hemostasis achieved with:  aluminum chloride Outcome: patient tolerated procedure well with no complications   Post-procedure details: wound care instructions given    Specimen 6 - Surgical pathology Differential Diagnosis: bcc vs scc Check Margins:  yes  Left Lower Leg - Anterior  Skin / nail biopsy Type of biopsy: tangential   Informed consent: discussed and consent obtained   Timeout: patient name, date of birth, surgical site, and procedure verified   Anesthesia: the lesion was anesthetized in a standard fashion   Anesthetic:  1% lidocaine w/ epinephrine 1-100,000 local infiltration Instrument used: flexible razor blade   Hemostasis achieved with: aluminum chloride and electrodesiccation   Outcome: patient tolerated procedure well   Post-procedure details: wound care instructions given    Destruction of lesion Complexity: simple   Destruction method: electrodesiccation and curettage   Informed consent: discussed and consent obtained   Timeout:  patient name, date of birth, surgical site, and procedure verified Anesthesia: the lesion was anesthetized in a standard fashion   Anesthetic:  1% lidocaine w/ epinephrine 1-100,000 local infiltration Curettage performed in three different directions: Yes   Curettage cycles:  3 Margin per side (cm):  0.1 Final wound size (cm):  1.3 Hemostasis achieved with:  aluminum chloride Outcome: patient tolerated procedure well with no complications   Post-procedure details: wound care instructions given    Specimen 7 - Surgical pathology Differential Diagnosis: bcc vs scc Check Margins: yes  SCC (squamous cell carcinoma), arm, right Right Forearm - Posterior  Skin / nail biopsy Type of biopsy: tangential   Informed consent: discussed and consent obtained   Timeout: patient name, date of birth, surgical site, and procedure verified   Anesthesia: the lesion was  anesthetized in a standard fashion   Anesthetic:  1% lidocaine w/ epinephrine 1-100,000 local infiltration Instrument used: flexible razor blade   Hemostasis achieved with: aluminum chloride and electrodesiccation   Outcome: patient tolerated procedure well   Post-procedure details: wound care instructions given     Destruction of lesion Complexity: simple   Destruction method: electrodesiccation and curettage   Informed consent: discussed and consent obtained   Timeout:  patient name, date of birth, surgical site, and procedure verified Anesthesia: the lesion was anesthetized in a standard fashion   Anesthetic:  1% lidocaine w/ epinephrine 1-100,000 local infiltration Curettage performed in three different directions: Yes   Electrodesiccation performed over the curetted area: Yes   Curettage cycles:  3 Margin per side (cm):  0.1 Final wound size (cm):  1 Hemostasis achieved with:  aluminum chloride Outcome: patient tolerated procedure well with no complications   Post-procedure details: wound care instructions given    Specimen 8 - Surgical pathology Differential Diagnosis: bcc vs scc Check Margins: yes  SCC (squamous cell carcinoma), foot, right Right foot- inner  Skin / nail biopsy Type of biopsy: tangential   Informed consent: discussed and consent obtained   Timeout: patient name, date of birth, surgical site, and procedure verified   Anesthesia: the lesion was anesthetized in a standard fashion   Anesthetic:  1% lidocaine w/ epinephrine 1-100,000 local infiltration Instrument used: flexible razor blade   Hemostasis achieved with: aluminum chloride and electrodesiccation   Outcome: patient tolerated procedure well   Post-procedure details: wound care instructions given    Destruction of lesion Complexity: simple   Destruction method: electrodesiccation and curettage   Informed consent: discussed and consent obtained   Timeout:  patient name, date of birth, surgical site, and procedure verified Anesthesia: the lesion was anesthetized in a standard fashion   Anesthetic:  1% lidocaine w/ epinephrine 1-100,000 local infiltration Curettage performed in three different directions: Yes   Electrodesiccation performed over the curetted area: Yes   Curettage cycles:  3 Margin per side  (cm):  0.1 Final wound size (cm):  1.1 Hemostasis achieved with:  aluminum chloride Outcome: patient tolerated procedure well with no complications   Post-procedure details: wound care instructions given    Specimen 5 - Surgical pathology Differential Diagnosis: bcc vs scc Check Margins: yes  SCC (squamous cell carcinoma), scalp/neck Mid Parietal Scalp  Skin / nail biopsy Type of biopsy: tangential   Informed consent: discussed and consent obtained   Timeout: patient name, date of birth, surgical site, and procedure verified   Anesthesia: the lesion was anesthetized in a standard fashion   Anesthetic:  1% lidocaine w/ epinephrine 1-100,000 local infiltration Instrument used: flexible razor blade   Hemostasis achieved with: aluminum chloride and electrodesiccation   Outcome: patient tolerated procedure well   Post-procedure details: wound care instructions given    Specimen 4 - Surgical pathology Differential Diagnosis: bcc vs scc Check Margins: yes  SCC (squamous cell carcinoma), trunk Left chest upper  Skin / nail biopsy Type of biopsy: tangential   Informed consent: discussed and consent obtained   Timeout: patient name, date of birth, surgical site, and procedure verified   Anesthesia: the lesion was anesthetized in a standard fashion   Anesthetic:  1% lidocaine w/ epinephrine 1-100,000 local infiltration Instrument used: flexible razor blade   Hemostasis achieved with: aluminum chloride and electrodesiccation   Outcome: patient tolerated procedure well   Post-procedure details: wound care instructions given    Destruction of lesion Complexity: simple  Destruction method: electrodesiccation and curettage   Informed consent: discussed and consent obtained   Timeout:  patient name, date of birth, surgical site, and procedure verified Anesthesia: the lesion was anesthetized in a standard fashion   Anesthetic:  1% lidocaine w/ epinephrine 1-100,000 local  infiltration Curettage performed in three different directions: Yes   Electrodesiccation performed over the curetted area: Yes   Curettage cycles:  3 Margin per side (cm):  0.1 Final wound size (cm):  1 Hemostasis achieved with:  aluminum chloride Outcome: patient tolerated procedure well with no complications   Post-procedure details: wound care instructions given   Additional details:  Wound inoculated with 5% fluorouracil solution  Specimen 3 - Surgical pathology Differential Diagnosis: bcc vs scc Check Margins: yes   I, Coleson Kant, PA-C, have reviewed all documentation's for this visit.  The documentation on 08/08/20 for the exam, diagnosis, procedures and orders are all accurate and complete.

## 2020-08-08 NOTE — Addendum Note (Signed)
Addended by: Robyne Askew R on: 08/08/2020 10:51 AM   Modules accepted: Orders

## 2020-08-08 NOTE — Patient Instructions (Signed)
[facility] Discharge Instructions for Patients Receiving Chemotherapy  Today you received the following chemotherapy agents   To help prevent nausea and vomiting after your treatment, we encourage you to take your nausea medication   If you develop nausea and vomiting that is not controlled by your nausea medication, call the clinic.   BELOW ARE SYMPTOMS THAT SHOULD BE REPORTED IMMEDIATELY:  *FEVER GREATER THAN 100.5 F  *CHILLS WITH OR WITHOUT FEVER  NAUSEA AND VOMITING THAT IS NOT CONTROLLED WITH YOUR NAUSEA MEDICATION  *UNUSUAL SHORTNESS OF BREATH  *UNUSUAL BRUISING OR BLEEDING  TENDERNESS IN MOUTH AND THROAT WITH OR WITHOUT PRESENCE OF ULCERS  *URINARY PROBLEMS  *BOWEL PROBLEMS  UNUSUAL RASH Items with * indicate a potential emergency and should be followed up as soon as possible.  Feel free to call the clinic should you have any questions or concerns. The clinic phone number is [phone].  Please show the CHEMO ALERT CARD at check-in to the Emergency Department and triage nurse.   

## 2020-08-09 ENCOUNTER — Encounter: Payer: Self-pay | Admitting: *Deleted

## 2020-08-15 ENCOUNTER — Other Ambulatory Visit: Payer: Self-pay | Admitting: Family

## 2020-08-15 DIAGNOSIS — E039 Hypothyroidism, unspecified: Secondary | ICD-10-CM

## 2020-08-15 NOTE — Telephone Encounter (Signed)
Pt has apt this month with Alyse Low she was seen in August and was told to f/u in 6 months wants to know if she can just come in and have labs

## 2020-08-17 DIAGNOSIS — Z944 Liver transplant status: Secondary | ICD-10-CM | POA: Diagnosis not present

## 2020-08-19 ENCOUNTER — Other Ambulatory Visit: Payer: Self-pay | Admitting: Family

## 2020-08-21 ENCOUNTER — Other Ambulatory Visit: Payer: Self-pay | Admitting: Family

## 2020-08-22 ENCOUNTER — Other Ambulatory Visit: Payer: Medicare Other

## 2020-08-22 ENCOUNTER — Telehealth: Payer: Self-pay

## 2020-08-22 ENCOUNTER — Other Ambulatory Visit: Payer: Self-pay

## 2020-08-22 DIAGNOSIS — E039 Hypothyroidism, unspecified: Secondary | ICD-10-CM | POA: Diagnosis not present

## 2020-08-22 NOTE — Telephone Encounter (Signed)
Left detailed message on patients voicemail that she has already had both shingrix shots. Left flu information for patient and advised to call back with any further questions

## 2020-08-23 LAB — TSH: TSH: 4.05 u[IU]/mL (ref 0.450–4.500)

## 2020-08-25 ENCOUNTER — Ambulatory Visit (INDEPENDENT_AMBULATORY_CARE_PROVIDER_SITE_OTHER): Payer: Medicare Other | Admitting: Family Medicine

## 2020-08-25 ENCOUNTER — Other Ambulatory Visit: Payer: Self-pay

## 2020-08-25 DIAGNOSIS — Z23 Encounter for immunization: Secondary | ICD-10-CM | POA: Diagnosis not present

## 2020-08-26 ENCOUNTER — Ambulatory Visit: Payer: Medicare Other | Admitting: Family

## 2020-08-31 ENCOUNTER — Ambulatory Visit: Payer: Medicare Other | Admitting: Family

## 2020-09-09 ENCOUNTER — Ambulatory Visit: Payer: Medicare Other | Admitting: Family

## 2020-09-13 DIAGNOSIS — Z944 Liver transplant status: Secondary | ICD-10-CM | POA: Diagnosis not present

## 2020-09-14 DIAGNOSIS — H1711 Central corneal opacity, right eye: Secondary | ICD-10-CM | POA: Diagnosis not present

## 2020-09-20 NOTE — Telephone Encounter (Signed)
error 

## 2020-09-22 DIAGNOSIS — C4442 Squamous cell carcinoma of skin of scalp and neck: Secondary | ICD-10-CM | POA: Diagnosis not present

## 2020-10-05 DIAGNOSIS — C44329 Squamous cell carcinoma of skin of other parts of face: Secondary | ICD-10-CM | POA: Diagnosis not present

## 2020-10-05 DIAGNOSIS — Z79891 Long term (current) use of opiate analgesic: Secondary | ICD-10-CM | POA: Diagnosis not present

## 2020-10-05 DIAGNOSIS — Z944 Liver transplant status: Secondary | ICD-10-CM | POA: Diagnosis not present

## 2020-10-05 DIAGNOSIS — Z7982 Long term (current) use of aspirin: Secondary | ICD-10-CM | POA: Diagnosis not present

## 2020-10-05 DIAGNOSIS — Z885 Allergy status to narcotic agent status: Secondary | ICD-10-CM | POA: Diagnosis not present

## 2020-10-05 DIAGNOSIS — Z800 Family history of malignant neoplasm of digestive organs: Secondary | ICD-10-CM | POA: Diagnosis not present

## 2020-10-05 DIAGNOSIS — Z85038 Personal history of other malignant neoplasm of large intestine: Secondary | ICD-10-CM | POA: Diagnosis not present

## 2020-10-05 DIAGNOSIS — K219 Gastro-esophageal reflux disease without esophagitis: Secondary | ICD-10-CM | POA: Diagnosis not present

## 2020-10-05 DIAGNOSIS — Z886 Allergy status to analgesic agent status: Secondary | ICD-10-CM | POA: Diagnosis not present

## 2020-10-05 DIAGNOSIS — Z79899 Other long term (current) drug therapy: Secondary | ICD-10-CM | POA: Diagnosis not present

## 2020-10-10 ENCOUNTER — Other Ambulatory Visit: Payer: Self-pay

## 2020-10-10 ENCOUNTER — Encounter: Payer: Self-pay | Admitting: Nurse Practitioner

## 2020-10-10 ENCOUNTER — Ambulatory Visit (INDEPENDENT_AMBULATORY_CARE_PROVIDER_SITE_OTHER): Payer: Medicare Other | Admitting: Nurse Practitioner

## 2020-10-10 VITALS — BP 116/78 | HR 91 | Temp 97.3°F | Resp 20 | Ht 64.0 in | Wt 142.0 lb

## 2020-10-10 DIAGNOSIS — L309 Dermatitis, unspecified: Secondary | ICD-10-CM

## 2020-10-10 MED ORDER — CLOTRIMAZOLE-BETAMETHASONE 1-0.05 % EX CREA: 1.0000 "application " | TOPICAL_CREAM | Freq: Two times a day (BID) | CUTANEOUS | 0 refills | Status: AC

## 2020-10-10 MED ORDER — PREDNISONE 20 MG PO TABS: ORAL_TABLET | ORAL | 0 refills | Status: DC

## 2020-10-10 NOTE — Progress Notes (Signed)
   Subjective:    Patient ID: Cassidy Barber, female    DOB: 29-Jun-1951, 69 y.o.   MRN: [id number]   Chief Complaint: Breaking out on both legs   HPI Patient come sin today c/o rash on bil lower extremity. It started about 1 week ago. Has been painful and itching. Is only on inside of legs where they touch. Is some better.    Review of Systems  Constitutional: Negative for diaphoresis.  Eyes: Negative for pain.  Respiratory: Negative for shortness of breath.   Cardiovascular: Negative for chest pain, palpitations and leg swelling.  Gastrointestinal: Negative for abdominal pain.  Endocrine: Negative for polydipsia.  Skin: Negative for rash.  Neurological: Negative for dizziness, weakness and headaches.  Hematological: Does not bruise/bleed easily.  All other systems reviewed and are negative.      Objective:   Physical Exam Vitals and nursing note reviewed.  Constitutional:      Appearance: Normal appearance.  Cardiovascular:     Rate and Rhythm: Normal rate and regular rhythm.  Pulmonary:     Breath sounds: Normal breath sounds.  Skin:    Findings: Rash (erythematous with slightly raised lesions. are on bil inside of both loewer ext where legs touch one another. ) present.  Neurological:     General: No focal deficit present.     Mental Status: She is alert.  Psychiatric:        Mood and Affect: Mood normal.        Behavior: Behavior normal.    BP 116/78   Pulse 91   Temp (!) 97.3 F (36.3 C) (Temporal)   Resp 20   Ht 5\' 4"  (1.626 m)   Wt 142 lb (64.4 kg)   SpO2 98%   BMI 24.37 kg/m         Assessment & Plan:  Cassidy Barber in today with chief complaint of Breaking out on both legs   1. Dermatitis Avoid scratching Cool compresses RTO prn - clotrimazole-betamethasone (LOTRISONE) cream; Apply 1 application topically 2 (two) times daily.  Dispense: 30 g; Refill: 0 - predniSONE (DELTASONE) 20 MG tablet; 2 po at sametime daily for 5 days  Dispense: 10  tablet; Refill: 0    The above assessment and management plan was discussed with the patient. The patient verbalized understanding of and has agreed to the management plan. Patient is aware to call the clinic if symptoms persist or worsen. Patient is aware when to return to the clinic for a follow-up visit. Patient educated on when it is appropriate to go to the emergency department.   Mary-Margaret Hassell Done, FNP

## 2020-10-11 DIAGNOSIS — K219 Gastro-esophageal reflux disease without esophagitis: Secondary | ICD-10-CM | POA: Diagnosis not present

## 2020-10-11 DIAGNOSIS — Z800 Family history of malignant neoplasm of digestive organs: Secondary | ICD-10-CM | POA: Diagnosis not present

## 2020-10-11 DIAGNOSIS — Z510 Encounter for antineoplastic radiation therapy: Secondary | ICD-10-CM | POA: Diagnosis not present

## 2020-10-11 DIAGNOSIS — Z85038 Personal history of other malignant neoplasm of large intestine: Secondary | ICD-10-CM | POA: Diagnosis not present

## 2020-10-11 DIAGNOSIS — Z79891 Long term (current) use of opiate analgesic: Secondary | ICD-10-CM | POA: Diagnosis not present

## 2020-10-11 DIAGNOSIS — C44329 Squamous cell carcinoma of skin of other parts of face: Secondary | ICD-10-CM | POA: Diagnosis not present

## 2020-10-11 DIAGNOSIS — Z7982 Long term (current) use of aspirin: Secondary | ICD-10-CM | POA: Diagnosis not present

## 2020-10-11 DIAGNOSIS — Z885 Allergy status to narcotic agent status: Secondary | ICD-10-CM | POA: Diagnosis not present

## 2020-10-11 DIAGNOSIS — Z79899 Other long term (current) drug therapy: Secondary | ICD-10-CM | POA: Diagnosis not present

## 2020-10-11 DIAGNOSIS — Z886 Allergy status to analgesic agent status: Secondary | ICD-10-CM | POA: Diagnosis not present

## 2020-10-11 DIAGNOSIS — Z944 Liver transplant status: Secondary | ICD-10-CM | POA: Diagnosis not present

## 2020-10-17 ENCOUNTER — Other Ambulatory Visit: Payer: Medicare Other

## 2020-10-17 ENCOUNTER — Other Ambulatory Visit: Payer: Self-pay

## 2020-10-17 ENCOUNTER — Other Ambulatory Visit: Payer: Self-pay | Admitting: Nurse Practitioner

## 2020-10-17 ENCOUNTER — Other Ambulatory Visit (HOSPITAL_COMMUNITY): Payer: Self-pay

## 2020-10-17 DIAGNOSIS — I10 Essential (primary) hypertension: Secondary | ICD-10-CM

## 2020-10-17 DIAGNOSIS — D849 Immunodeficiency, unspecified: Secondary | ICD-10-CM | POA: Diagnosis not present

## 2020-10-17 DIAGNOSIS — D508 Other iron deficiency anemias: Secondary | ICD-10-CM

## 2020-10-17 DIAGNOSIS — Z944 Liver transplant status: Secondary | ICD-10-CM | POA: Diagnosis not present

## 2020-10-17 DIAGNOSIS — Z298 Encounter for other specified prophylactic measures: Secondary | ICD-10-CM | POA: Diagnosis not present

## 2020-10-17 DIAGNOSIS — D75839 Thrombocytosis, unspecified: Secondary | ICD-10-CM

## 2020-10-18 ENCOUNTER — Other Ambulatory Visit: Payer: Self-pay

## 2020-10-18 ENCOUNTER — Inpatient Hospital Stay (HOSPITAL_COMMUNITY): Payer: Medicare Other | Attending: Hematology and Oncology

## 2020-10-18 DIAGNOSIS — C4440 Unspecified malignant neoplasm of skin of scalp and neck: Secondary | ICD-10-CM | POA: Insufficient documentation

## 2020-10-18 DIAGNOSIS — D508 Other iron deficiency anemias: Secondary | ICD-10-CM

## 2020-10-18 DIAGNOSIS — E559 Vitamin D deficiency, unspecified: Secondary | ICD-10-CM | POA: Insufficient documentation

## 2020-10-18 DIAGNOSIS — E039 Hypothyroidism, unspecified: Secondary | ICD-10-CM | POA: Diagnosis not present

## 2020-10-18 DIAGNOSIS — Z944 Liver transplant status: Secondary | ICD-10-CM | POA: Insufficient documentation

## 2020-10-18 DIAGNOSIS — C44329 Squamous cell carcinoma of skin of other parts of face: Secondary | ICD-10-CM | POA: Diagnosis not present

## 2020-10-18 DIAGNOSIS — E079 Disorder of thyroid, unspecified: Secondary | ICD-10-CM | POA: Diagnosis not present

## 2020-10-18 DIAGNOSIS — D509 Iron deficiency anemia, unspecified: Secondary | ICD-10-CM | POA: Diagnosis not present

## 2020-10-18 DIAGNOSIS — D75839 Thrombocytosis, unspecified: Secondary | ICD-10-CM

## 2020-10-18 DIAGNOSIS — Z885 Allergy status to narcotic agent status: Secondary | ICD-10-CM | POA: Diagnosis not present

## 2020-10-18 DIAGNOSIS — K219 Gastro-esophageal reflux disease without esophagitis: Secondary | ICD-10-CM | POA: Diagnosis not present

## 2020-10-18 DIAGNOSIS — Z923 Personal history of irradiation: Secondary | ICD-10-CM | POA: Diagnosis not present

## 2020-10-18 DIAGNOSIS — Z79899 Other long term (current) drug therapy: Secondary | ICD-10-CM | POA: Diagnosis not present

## 2020-10-18 DIAGNOSIS — Z800 Family history of malignant neoplasm of digestive organs: Secondary | ICD-10-CM | POA: Diagnosis not present

## 2020-10-18 DIAGNOSIS — I10 Essential (primary) hypertension: Secondary | ICD-10-CM | POA: Diagnosis not present

## 2020-10-18 DIAGNOSIS — Z881 Allergy status to other antibiotic agents status: Secondary | ICD-10-CM | POA: Diagnosis not present

## 2020-10-18 DIAGNOSIS — Z510 Encounter for antineoplastic radiation therapy: Secondary | ICD-10-CM | POA: Diagnosis not present

## 2020-10-18 DIAGNOSIS — Z8719 Personal history of other diseases of the digestive system: Secondary | ICD-10-CM | POA: Insufficient documentation

## 2020-10-18 DIAGNOSIS — Z79891 Long term (current) use of opiate analgesic: Secondary | ICD-10-CM | POA: Diagnosis not present

## 2020-10-18 LAB — CBC WITH DIFFERENTIAL/PLATELET
Abs Immature Granulocytes: 0.02 10*3/uL (ref 0.00–0.07)
Basophils Absolute: 0.1 10*3/uL (ref 0.0–0.1)
Basophils Relative: 1 %
Eosinophils Absolute: 0.6 10*3/uL — ABNORMAL HIGH (ref 0.0–0.5)
Eosinophils Relative: 7 %
HCT: 34.8 % — ABNORMAL LOW (ref 36.0–46.0)
Hemoglobin: 10.2 g/dL — ABNORMAL LOW (ref 12.0–15.0)
Immature Granulocytes: 0 %
Lymphocytes Relative: 20 %
Lymphs Abs: 1.6 10*3/uL (ref 0.7–4.0)
MCH: 28.6 pg (ref 26.0–34.0)
MCHC: 29.3 g/dL — ABNORMAL LOW (ref 30.0–36.0)
MCV: 97.5 fL (ref 80.0–100.0)
Monocytes Absolute: 1.4 10*3/uL — ABNORMAL HIGH (ref 0.1–1.0)
Monocytes Relative: 18 %
Neutro Abs: 4.1 10*3/uL (ref 1.7–7.7)
Neutrophils Relative %: 54 %
Platelets: 395 10*3/uL (ref 150–400)
RBC: 3.57 MIL/uL — ABNORMAL LOW (ref 3.87–5.11)
RDW: 15.7 % — ABNORMAL HIGH (ref 11.5–15.5)
WBC: 7.7 10*3/uL (ref 4.0–10.5)
nRBC: 0 % (ref 0.0–0.2)

## 2020-10-18 LAB — COMPREHENSIVE METABOLIC PANEL
ALT: 13 U/L (ref 0–44)
AST: 18 U/L (ref 15–41)
Albumin: 3.1 g/dL — ABNORMAL LOW (ref 3.5–5.0)
Alkaline Phosphatase: 96 U/L (ref 38–126)
Anion gap: 8 (ref 5–15)
BUN: 22 mg/dL (ref 8–23)
CO2: 27 mmol/L (ref 22–32)
Calcium: 8.3 mg/dL — ABNORMAL LOW (ref 8.9–10.3)
Chloride: 102 mmol/L (ref 98–111)
Creatinine, Ser: 0.92 mg/dL (ref 0.44–1.00)
GFR, Estimated: >60 mL/min (ref >60)
Glucose, Bld: 145 mg/dL — ABNORMAL HIGH (ref 70–99)
Potassium: 4.3 mmol/L (ref 3.5–5.1)
Sodium: 137 mmol/L (ref 135–145)
Total Bilirubin: 0.6 mg/dL (ref 0.3–1.2)
Total Protein: 6.6 g/dL (ref 6.5–8.1)

## 2020-10-18 LAB — VITAMIN B12: Vitamin B-12: 1088 pg/mL — ABNORMAL HIGH (ref 180–914)

## 2020-10-18 LAB — IRON AND TIBC
Iron: 17 ug/dL — ABNORMAL LOW (ref 28–170)
Saturation Ratios: 6 % — ABNORMAL LOW (ref 10.4–31.8)
TIBC: 271 ug/dL (ref 250–450)
UIBC: 254 ug/dL

## 2020-10-18 LAB — LACTATE DEHYDROGENASE: LDH: 157 U/L (ref 98–192)

## 2020-10-18 LAB — VITAMIN D 25 HYDROXY (VIT D DEFICIENCY, FRACTURES): Vit D, 25-Hydroxy: 67.04 ng/mL (ref 30–100)

## 2020-10-18 LAB — FERRITIN: Ferritin: 24 ng/mL (ref 11–307)

## 2020-10-18 LAB — FOLATE: Folate: 9.6 ng/mL (ref >5.9)

## 2020-10-20 DIAGNOSIS — E079 Disorder of thyroid, unspecified: Secondary | ICD-10-CM | POA: Diagnosis not present

## 2020-10-20 DIAGNOSIS — K219 Gastro-esophageal reflux disease without esophagitis: Secondary | ICD-10-CM | POA: Diagnosis not present

## 2020-10-20 DIAGNOSIS — H01004 Unspecified blepharitis left upper eyelid: Secondary | ICD-10-CM | POA: Diagnosis not present

## 2020-10-20 DIAGNOSIS — H01001 Unspecified blepharitis right upper eyelid: Secondary | ICD-10-CM | POA: Diagnosis not present

## 2020-10-20 DIAGNOSIS — Z79891 Long term (current) use of opiate analgesic: Secondary | ICD-10-CM | POA: Diagnosis not present

## 2020-10-20 DIAGNOSIS — Z923 Personal history of irradiation: Secondary | ICD-10-CM | POA: Diagnosis not present

## 2020-10-20 DIAGNOSIS — Z885 Allergy status to narcotic agent status: Secondary | ICD-10-CM | POA: Diagnosis not present

## 2020-10-20 DIAGNOSIS — Z510 Encounter for antineoplastic radiation therapy: Secondary | ICD-10-CM | POA: Diagnosis not present

## 2020-10-20 DIAGNOSIS — Z79899 Other long term (current) drug therapy: Secondary | ICD-10-CM | POA: Diagnosis not present

## 2020-10-20 DIAGNOSIS — H01005 Unspecified blepharitis left lower eyelid: Secondary | ICD-10-CM | POA: Diagnosis not present

## 2020-10-20 DIAGNOSIS — Z881 Allergy status to other antibiotic agents status: Secondary | ICD-10-CM | POA: Diagnosis not present

## 2020-10-20 DIAGNOSIS — Z800 Family history of malignant neoplasm of digestive organs: Secondary | ICD-10-CM | POA: Diagnosis not present

## 2020-10-20 DIAGNOSIS — H01002 Unspecified blepharitis right lower eyelid: Secondary | ICD-10-CM | POA: Diagnosis not present

## 2020-10-20 DIAGNOSIS — C44329 Squamous cell carcinoma of skin of other parts of face: Secondary | ICD-10-CM | POA: Diagnosis not present

## 2020-10-21 DIAGNOSIS — E079 Disorder of thyroid, unspecified: Secondary | ICD-10-CM | POA: Diagnosis not present

## 2020-10-21 DIAGNOSIS — Z885 Allergy status to narcotic agent status: Secondary | ICD-10-CM | POA: Diagnosis not present

## 2020-10-21 DIAGNOSIS — Z923 Personal history of irradiation: Secondary | ICD-10-CM | POA: Diagnosis not present

## 2020-10-21 DIAGNOSIS — Z800 Family history of malignant neoplasm of digestive organs: Secondary | ICD-10-CM | POA: Diagnosis not present

## 2020-10-21 DIAGNOSIS — Z79891 Long term (current) use of opiate analgesic: Secondary | ICD-10-CM | POA: Diagnosis not present

## 2020-10-21 DIAGNOSIS — Z881 Allergy status to other antibiotic agents status: Secondary | ICD-10-CM | POA: Diagnosis not present

## 2020-10-21 DIAGNOSIS — C44329 Squamous cell carcinoma of skin of other parts of face: Secondary | ICD-10-CM | POA: Diagnosis not present

## 2020-10-21 DIAGNOSIS — K219 Gastro-esophageal reflux disease without esophagitis: Secondary | ICD-10-CM | POA: Diagnosis not present

## 2020-10-21 DIAGNOSIS — Z79899 Other long term (current) drug therapy: Secondary | ICD-10-CM | POA: Diagnosis not present

## 2020-10-24 DIAGNOSIS — Z800 Family history of malignant neoplasm of digestive organs: Secondary | ICD-10-CM | POA: Diagnosis not present

## 2020-10-24 DIAGNOSIS — C44329 Squamous cell carcinoma of skin of other parts of face: Secondary | ICD-10-CM | POA: Diagnosis not present

## 2020-10-24 DIAGNOSIS — Z79891 Long term (current) use of opiate analgesic: Secondary | ICD-10-CM | POA: Diagnosis not present

## 2020-10-24 DIAGNOSIS — K219 Gastro-esophageal reflux disease without esophagitis: Secondary | ICD-10-CM | POA: Diagnosis not present

## 2020-10-24 DIAGNOSIS — Z923 Personal history of irradiation: Secondary | ICD-10-CM | POA: Diagnosis not present

## 2020-10-24 DIAGNOSIS — Z885 Allergy status to narcotic agent status: Secondary | ICD-10-CM | POA: Diagnosis not present

## 2020-10-24 DIAGNOSIS — Z79899 Other long term (current) drug therapy: Secondary | ICD-10-CM | POA: Diagnosis not present

## 2020-10-24 DIAGNOSIS — E079 Disorder of thyroid, unspecified: Secondary | ICD-10-CM | POA: Diagnosis not present

## 2020-10-24 DIAGNOSIS — Z881 Allergy status to other antibiotic agents status: Secondary | ICD-10-CM | POA: Diagnosis not present

## 2020-10-25 ENCOUNTER — Telehealth (HOSPITAL_COMMUNITY): Payer: Medicare Other | Admitting: Hematology

## 2020-10-25 DIAGNOSIS — Z8673 Personal history of transient ischemic attack (TIA), and cerebral infarction without residual deficits: Secondary | ICD-10-CM | POA: Diagnosis not present

## 2020-10-25 DIAGNOSIS — E079 Disorder of thyroid, unspecified: Secondary | ICD-10-CM | POA: Diagnosis not present

## 2020-10-25 DIAGNOSIS — C44329 Squamous cell carcinoma of skin of other parts of face: Secondary | ICD-10-CM | POA: Diagnosis not present

## 2020-10-25 DIAGNOSIS — Z1509 Genetic susceptibility to other malignant neoplasm: Secondary | ICD-10-CM | POA: Diagnosis not present

## 2020-10-25 DIAGNOSIS — Z800 Family history of malignant neoplasm of digestive organs: Secondary | ICD-10-CM | POA: Diagnosis not present

## 2020-10-25 DIAGNOSIS — D849 Immunodeficiency, unspecified: Secondary | ICD-10-CM | POA: Diagnosis not present

## 2020-10-25 DIAGNOSIS — I272 Pulmonary hypertension, unspecified: Secondary | ICD-10-CM | POA: Diagnosis not present

## 2020-10-25 DIAGNOSIS — K219 Gastro-esophageal reflux disease without esophagitis: Secondary | ICD-10-CM | POA: Diagnosis not present

## 2020-10-25 DIAGNOSIS — Z79891 Long term (current) use of opiate analgesic: Secondary | ICD-10-CM | POA: Diagnosis not present

## 2020-10-25 DIAGNOSIS — Z881 Allergy status to other antibiotic agents status: Secondary | ICD-10-CM | POA: Diagnosis not present

## 2020-10-25 DIAGNOSIS — Z79890 Hormone replacement therapy (postmenopausal): Secondary | ICD-10-CM | POA: Diagnosis not present

## 2020-10-25 DIAGNOSIS — Z923 Personal history of irradiation: Secondary | ICD-10-CM | POA: Diagnosis not present

## 2020-10-25 DIAGNOSIS — Z79899 Other long term (current) drug therapy: Secondary | ICD-10-CM | POA: Diagnosis not present

## 2020-10-25 DIAGNOSIS — Z4823 Encounter for aftercare following liver transplant: Secondary | ICD-10-CM | POA: Diagnosis not present

## 2020-10-25 DIAGNOSIS — E039 Hypothyroidism, unspecified: Secondary | ICD-10-CM | POA: Diagnosis not present

## 2020-10-25 DIAGNOSIS — Z885 Allergy status to narcotic agent status: Secondary | ICD-10-CM | POA: Diagnosis not present

## 2020-10-25 DIAGNOSIS — C4442 Squamous cell carcinoma of skin of scalp and neck: Secondary | ICD-10-CM | POA: Diagnosis not present

## 2020-10-25 DIAGNOSIS — Z944 Liver transplant status: Secondary | ICD-10-CM | POA: Diagnosis not present

## 2020-10-25 DIAGNOSIS — I10 Essential (primary) hypertension: Secondary | ICD-10-CM | POA: Diagnosis not present

## 2020-10-26 ENCOUNTER — Other Ambulatory Visit: Payer: Self-pay | Admitting: Nurse Practitioner

## 2020-10-26 DIAGNOSIS — Z923 Personal history of irradiation: Secondary | ICD-10-CM | POA: Diagnosis not present

## 2020-10-26 DIAGNOSIS — Z885 Allergy status to narcotic agent status: Secondary | ICD-10-CM | POA: Diagnosis not present

## 2020-10-26 DIAGNOSIS — Z881 Allergy status to other antibiotic agents status: Secondary | ICD-10-CM | POA: Diagnosis not present

## 2020-10-26 DIAGNOSIS — E079 Disorder of thyroid, unspecified: Secondary | ICD-10-CM | POA: Diagnosis not present

## 2020-10-26 DIAGNOSIS — K219 Gastro-esophageal reflux disease without esophagitis: Secondary | ICD-10-CM | POA: Diagnosis not present

## 2020-10-26 DIAGNOSIS — Z79899 Other long term (current) drug therapy: Secondary | ICD-10-CM | POA: Diagnosis not present

## 2020-10-26 DIAGNOSIS — L509 Urticaria, unspecified: Secondary | ICD-10-CM

## 2020-10-26 DIAGNOSIS — Z800 Family history of malignant neoplasm of digestive organs: Secondary | ICD-10-CM | POA: Diagnosis not present

## 2020-10-26 DIAGNOSIS — C44329 Squamous cell carcinoma of skin of other parts of face: Secondary | ICD-10-CM | POA: Diagnosis not present

## 2020-10-26 DIAGNOSIS — Z79891 Long term (current) use of opiate analgesic: Secondary | ICD-10-CM | POA: Diagnosis not present

## 2020-10-27 DIAGNOSIS — Z79891 Long term (current) use of opiate analgesic: Secondary | ICD-10-CM | POA: Diagnosis not present

## 2020-10-27 DIAGNOSIS — K219 Gastro-esophageal reflux disease without esophagitis: Secondary | ICD-10-CM | POA: Diagnosis not present

## 2020-10-27 DIAGNOSIS — Z510 Encounter for antineoplastic radiation therapy: Secondary | ICD-10-CM | POA: Diagnosis not present

## 2020-10-27 DIAGNOSIS — Z885 Allergy status to narcotic agent status: Secondary | ICD-10-CM | POA: Diagnosis not present

## 2020-10-27 DIAGNOSIS — Z800 Family history of malignant neoplasm of digestive organs: Secondary | ICD-10-CM | POA: Diagnosis not present

## 2020-10-27 DIAGNOSIS — E079 Disorder of thyroid, unspecified: Secondary | ICD-10-CM | POA: Diagnosis not present

## 2020-10-27 DIAGNOSIS — Z923 Personal history of irradiation: Secondary | ICD-10-CM | POA: Diagnosis not present

## 2020-10-27 DIAGNOSIS — Z881 Allergy status to other antibiotic agents status: Secondary | ICD-10-CM | POA: Diagnosis not present

## 2020-10-27 DIAGNOSIS — Z79899 Other long term (current) drug therapy: Secondary | ICD-10-CM | POA: Diagnosis not present

## 2020-10-27 DIAGNOSIS — C44329 Squamous cell carcinoma of skin of other parts of face: Secondary | ICD-10-CM | POA: Diagnosis not present

## 2020-10-28 DIAGNOSIS — C44329 Squamous cell carcinoma of skin of other parts of face: Secondary | ICD-10-CM | POA: Diagnosis not present

## 2020-10-28 DIAGNOSIS — Z885 Allergy status to narcotic agent status: Secondary | ICD-10-CM | POA: Diagnosis not present

## 2020-10-28 DIAGNOSIS — Z800 Family history of malignant neoplasm of digestive organs: Secondary | ICD-10-CM | POA: Diagnosis not present

## 2020-10-28 DIAGNOSIS — Z923 Personal history of irradiation: Secondary | ICD-10-CM | POA: Diagnosis not present

## 2020-10-28 DIAGNOSIS — K219 Gastro-esophageal reflux disease without esophagitis: Secondary | ICD-10-CM | POA: Diagnosis not present

## 2020-10-28 DIAGNOSIS — E079 Disorder of thyroid, unspecified: Secondary | ICD-10-CM | POA: Diagnosis not present

## 2020-10-28 DIAGNOSIS — Z79899 Other long term (current) drug therapy: Secondary | ICD-10-CM | POA: Diagnosis not present

## 2020-10-28 DIAGNOSIS — Z79891 Long term (current) use of opiate analgesic: Secondary | ICD-10-CM | POA: Diagnosis not present

## 2020-10-28 DIAGNOSIS — Z881 Allergy status to other antibiotic agents status: Secondary | ICD-10-CM | POA: Diagnosis not present

## 2020-10-28 NOTE — Progress Notes (Signed)
[name], [place] [address]   DIAGNOSIS: Iron deficiency anemia  CURRENT THERAPY: Intermittent iron infusions  INTERVAL HISTORY: ANYSSA SHARPLESS 69 y.o. female was called for a telephone visit for iron deficiency anemia.  She continues to have fatigue which is stable.  She reports that she has having leg swelling which is new.  She also reports having itching of her legs.  She has seen dermatology for this.  She is currently being treated with radiation to her scalp for a skin cancer.  She has completed 5/20 fractions of radiation therapy to her scalp.  She has been treated in [place], [place].  She reports that she has had multiple skin cancers due to being on immunotherapy suppressive therapy after having a liver transplant.   She continues to be seen at [facility] for her history of a liver transplant.  She was told today that her most recent labs show that she needs a repeat iron infusion.  She reports that she suspected that this was the case as her energy level has been lower.  Her appetite remains good.  She denies any other issues of concern today.    Patient Active Problem List   Diagnosis Date Noted  . S/P reverse total shoulder arthroplasty, left 11/12/2019  . Anterior dislocation of left shoulder 09/26/2019  . Hx of colonic polyps 09/04/2019  . Chest pain 09/04/2019  . Benzodiazepine dependence ([place]) 04/13/2019  . Controlled substance agreement signed 04/13/2019  . Urticaria 09/08/2018  . S/p reverse total shoulder arthroplasty 07/17/2018  . History of colon cancer 06/12/2018  . History of colonic polyps 06/12/2018  . 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  . Gastroesophageal reflux disease 06/12/2018  . Chronic diarrhea 06/26/2017  . Iron deficiency anemia 06/26/2017  . GAD (generalized anxiety disorder) 08/28/2016  . Vitamin D deficiency 08/28/2016  . Insomnia 08/28/2016  . Convulsions ([place]) 11/30/2015  . Liver transplant recipient [facility])  11/30/2015  . Seizures ([place]) 09/27/2015  . Essential hypertension, benign 03/10/2015  . Hx of liver transplant ([place]) 12/08/2014  . Diarrhea 12/08/2014  . Colon cancer ([place]) 12/08/2014  . Hepatic encephalopathy ([place]) 10/26/2013  . Anemia of chronic disease 10/26/2013  . Personal history of colonic polyps 12/27/2012  . PBC (primary biliary cirrhosis) 03/24/2012  . Hypothyroidism 03/24/2012  . Ventral hernia, recurrent 03/24/2012    is allergic to ciprofloxacin and codeine.  MEDICAL HISTORY: Past Medical History:  Diagnosis Date  . Abdominal wall hernia    Incarcerated status post surgical repair 2019 - [facility]  . Anemia of chronic disease   . Atypical nevus 01/21/2018   atypical neoplasm- Left scalp-ant (txpbx + MOHS), atypical neoplasm- Left scalp post- (txpbx + MOHS)  . Colon cancer [facility])    Colon surgery 2005 and 2012  . History of pulmonary hypertension    Pre liver transplant  . History of seizures   . Hypertension   . Hypothyroidism   . Lynch syndrome   . Osteopenia   . Primary biliary cirrhosis (HCC)    Status post liver transplantation - follows at [facility]  . SCCA (squamous cell carcinoma) of skin 02/23/2020   Right Upper Chest(moderate) (MOH's)  . SCCA (squamous cell carcinoma) of skin 04/07/2020   Left Top Leg (Keratoacanthoma) treatment after biopsy  . SCCA (squamous cell carcinoma) of skin 04/07/2020   Left Foot Dorsal (in situ) treatment after biopsy  . Squamous cell carcinoma of skin 04/22/2018   KA-Right mid chest (txpbx), KA-left elbow  crease (txpbx), insitu-Right mid chest inf. (exc)  . Squamous cell carcinoma of skin 05/20/2018   well diff-Left upper shin (txpbx), well diff-Right lower forearm (txpbx), well diff-Right upper shin (txpbx)  . Squamous cell carcinoma of skin 06/11/2018   Scc + margin-Right mid chest inferior   . Squamous cell carcinoma of skin 06/27/2018   well diff-Left mid thigh(txpbx), well diff-Left inner thigh (txpbx),insitu-Right cheek  (txpbx),well diff-right inner heel (txpbx)  . Squamous cell carcinoma of skin 09/16/2018   well diff-left shoulder (txpbx), well diff-Right chin (txpbx), well diff-right chest lateral (CX35FU)  . Squamous cell carcinoma of skin 10/01/2018   Right outer lower shin (Txpbx)  . Squamous cell carcinoma of skin 04/03/2019   well diff-Right center chest (MOHS), in situ-Right ear  . Squamous cell carcinoma of skin 08/05/2019   in situ-Left calf (txpbx), in situ-left bicep (txpbx), well diff-Left chest,inf(txpbx), in situ-Right chest inf-(txpbx)  . Squamous cell carcinoma of skin 11/19/2019   KA-left top leg (txpbx), modify-Riight forehead-(mohs), in situ-right hand (txpbx), in situ-Right forearm (txpbx), well diff-Right chest (txpbx), well diff-chin (txpbx)  . Squamous cell carcinoma of skin 01/06/2020   KA- Left top leg  . Squamous cell carcinoma of skin 05/12/2013   bowens-middle of chest (CX35FU)  . Squamous cell carcinoma of skin 05/18/2015   well diff-Left upper arm (CX35FU + Exc),KA-right chest(txpbx), in situ-Left shin (txpbx), well diff-Right cheek (CX35FU), KA-Left post scalp (CX35FU)  . Squamous cell carcinoma of skin 08/09/2015   KA-Left post scalp ((MOHS), in situ- mid chest (Txpbx +exc), in situ-Left upper arm inferior (txpbx)  . Squamous cell carcinoma of skin 10/13/2015   Left upper arm-clear  . Squamous cell carcinoma of skin 03/09/2016   mod diff-mid chest (txpbx+ exc), mod diff-Right chest (txpbx+exc), well diff-right cheek-(txpbx),well diff-Left hand-(txpbx), in situ-Left upper arm (txpbx), well diff-Right cheek -(txpbx), well diff-Right crease arm (txpbx)   . Squamous cell carcinoma of skin 05/24/2016   well diff-Right nasal crease-(MOHS)  . Squamous cell carcinoma of skin 08/02/2016   KA-Left chest med (txpbx)  . Squamous cell carcinoma of skin 08/30/2016   in situ-Left outer zygoma (txpbx)  . Squamous cell carcinoma of skin 12/06/2016   well diff-Left chest sup, Left  shoudler, insitu- right post scalp  . Squamous cell carcinoma of skin 02/14/2017   well diff-Left forearm (EXC),in situ-RIght ant neck  . Squamous cell carcinoma of skin 06/14/2017   in situ-Right forearm (txpbx), in situ-Right chest (txpbx), well diff-left chest (txpbx), well diff-anterior neck- (txpbx)  . Squamous cell carcinoma of skin 08/07/2017   well diff-Left upper shoulder (txpbx), sup and invasive-Left temple (txpbx), well diff-Right upper shin (txpbx), in situ-Right clavicle (txpbx)  . Squamous cell carcinoma of skin 10/17/2017   well diff-ant. neck (MOHS), in situ-Right chest, inf (txpbx)  . Squamous cell carcinoma of skin 01/21/2018   well diff- Right chest,ulnar (txpbx), well diff- right upper chest (txpbx), in situ-Right ant. crown (txpbx)  . Squamous cell carcinoma of skin 08/02/2020   well diff-left lower leg-inferior (Txpbx)  . Squamous cell carcinoma of skin 08/02/2020   well diff-right lower leg-mid (txpbx)  . Squamous cell carcinoma of skin 08/02/2020   well diff-left chest upper  . Squamous cell carcinoma of skin 08/02/2020   well diff-mid parietal scalp (MOHS)  . Squamous cell carcinoma of skin 08/02/2020   well diff-right foot inner(txpbx)  . Squamous cell carcinoma of skin 08/02/2020   well diff- left lower leg medial (txpbx)  . Squamous cell carcinoma of  skin 08/02/2020   well diff-left lower leg anterior (txpbx)  . Squamous cell carcinoma of skin 08/02/2020   well diff-left lower leg medial (txpbx)  . Squamous cell carcinoma of skin 08/02/2020   well diff-right forearm-posterior (txpbx)    SURGICAL HISTORY: Past Surgical History:  Procedure Laterality Date  . ABDOMINAL HERNIA REPAIR     Patient's states that she has had 8- 9 hernia surgeries  . ABDOMINAL HYSTERECTOMY    . CATARACT EXTRACTION W/PHACO Right 01/15/2020   Procedure: CATARACT EXTRACTION PHACO AND INTRAOCULAR LENS PLACEMENT (IOC);  Surgeon: Baruch Goldmann, MD;  Location: [facility];  Service:  Ophthalmology;  Laterality: Right;  CDE: 7.89  . CATARACT EXTRACTION W/PHACO Left 01/29/2020   Procedure: CATARACT EXTRACTION PHACO AND INTRAOCULAR LENS PLACEMENT (IOC) (CDE: 6.33);  Surgeon: Baruch Goldmann, MD;  Location: [facility];  Service: Ophthalmology;  Laterality: Left;  . CHOLECYSTECTOMY  2007  . COLON RESECTION    . COLON SURGERY  2008   Done at [facility]  . COLONOSCOPY     Done at [facility]  . ESOPHAGOGASTRODUODENOSCOPY N/A 08/21/2018   Procedure: ESOPHAGOGASTRODUODENOSCOPY (EGD);  Surgeon: Rogene Houston, MD;  Location[facility];  Service: Endoscopy;  Laterality: N/A;  . ESOPHAGOGASTRODUODENOSCOPY (EGD) WITH PROPOFOL N/A 12/16/2019   Procedure: ESOPHAGOGASTRODUODENOSCOPY (EGD) WITH PROPOFOL;  Surgeon: Rogene Houston, MD;  Location: [facility];  Service: Endoscopy;  Laterality: N/A;  . EYE SURGERY     lasix  . FLEXIBLE SIGMOIDOSCOPY N/A 10/20/2015   Procedure: FLEXIBLE SIGMOIDOSCOPY;  Surgeon: Rogene Houston, MD;  Location: [facility];  Service: Endoscopy;  Laterality: N/A;  72 - Dr Laural Golden has meeting until 1:00  . FLEXIBLE SIGMOIDOSCOPY N/A 07/11/2016   Procedure: FLEXIBLE SIGMOIDOSCOPY;  Surgeon: Rogene Houston, MD;  [facility];  Service: Endoscopy;  Laterality: N/A;  1200  . FLEXIBLE SIGMOIDOSCOPY N/A 08/09/2017   Procedure: FLEXIBLE SIGMOIDOSCOPY;  Surgeon: Rogene Houston, MD;  [facility];  Service: Endoscopy;  Laterality: N/A;  1:00  . FLEXIBLE SIGMOIDOSCOPY N/A 08/21/2018   Procedure: FLEXIBLE SIGMOIDOSCOPY;  Surgeon: Rogene Houston, MD;  [facility];  Service: Endoscopy;  Laterality: N/A;  . FLEXIBLE SIGMOIDOSCOPY N/A 12/16/2019   Procedure: FLEXIBLE SIGMOIDOSCOPY wirh Propofol;  Surgeon: Rogene Houston, MD;  [facility];  Service: Endoscopy;  Laterality: N/A;  7:30  . FRACTURE SURGERY     right wrist metal plate  . HERNIA REPAIR    . LIVER TRANSPLANT  [id number]  . multiple skin cancers removed    . POLYPECTOMY   08/09/2017   Procedure: POLYPECTOMY;  Surgeon: Rogene Houston, MD;  Location[facility];  Service: Endoscopy;;  colon small bowel  . REVERSE SHOULDER ARTHROPLASTY Left 07/17/2018  . REVERSE SHOULDER ARTHROPLASTY Left 07/17/2018   Procedure: LEFT REVERSE SHOULDER ARTHROPLASTY;  Surgeon: [place] Britain, MD;  Location: [place];  Service: Orthopedics;  Laterality: Left;  150min  . SHOULDER CLOSED REDUCTION Left 09/27/2019   Procedure: CLOSED REDUCTION SHOULDER;  Surgeon: Paralee Cancel, MD;  Location: [facility];  Service: Orthopedics;  Laterality: Left;  . SPLENECTOMY  2006  . TOTAL SHOULDER REVISION Left 11/12/2019   Procedure: Revision Left Reverse Shoulder Arthroplasty with poly exchange SDD;  Surgeon: [place] Britain, MD;  Location: [facility];  Service: Orthopedics;  Laterality: Left;  162min -SDDC  . TYMPANOSTOMY TUBE PLACEMENT    . UPPER GASTROINTESTINAL ENDOSCOPY     Done at [facility]    SOCIAL HISTORY: Social History  Socioeconomic History  . Marital status: Married    Spouse name: Charlotte Crumb  . Number of children: 1  . Years of education: 51  . Highest education level: High school graduate  Occupational History  . Occupation: Disability    Employer: [employer]    Comment: Multimedia programmer  Tobacco Use  . Smoking status: Never Smoker  . Smokeless tobacco: Never Used  Vaping Use  . Vaping Use: Never used  Substance and Sexual Activity  . Alcohol use: No    Alcohol/week: 0.0 standard drinks  . Drug use: No  . Sexual activity: Yes    Birth control/protection: None  Other Topics Concern  . Not on file  Social History Narrative   Patient lives in a two story home with her husband. She has an adult son. She is retired from being an Web designer for 30 years.    Social Determinants of Health   Financial Resource Strain: Low Risk   . Difficulty of Paying Living Expenses: Not hard at all  Food Insecurity: No Food Insecurity  . Worried About Charity fundraiser in the  Last Year: Never true  . Ran Out of Food in the Last Year: Never true  Transportation Needs: No Transportation Needs  . Lack of Transportation (Medical): No  . Lack of Transportation (Non-Medical): No  Physical Activity: Sufficiently Active  . Days of Exercise per Week: 7 days  . Minutes of Exercise per Session: 30 min  Stress: No Stress Concern Present  . Feeling of Stress : Not at all  Social Connections: Socially Integrated  . Frequency of Communication with Friends and Family: More than three times a week  . Frequency of Social Gatherings with Friends and Family: More than three times a week  . Attends Religious Services: More than 4 times per year  . Active Member of Clubs or Organizations: Yes  . Attends Archivist Meetings: More than 4 times per year  . Marital Status: Married  Human resources officer Violence: Not At Risk  . Fear of Current or Ex-Partner: No  . Emotionally Abused: No  . Physically Abused: No  . Sexually Abused: No    FAMILY HISTORY: Family History  Problem Relation Age of Onset  . Prostate cancer Father   . Colon cancer Father   . Colon cancer Sister   . Lung cancer Sister   . Healthy Son   . Alcohol abuse Brother   . Allergic rhinitis Neg Hx   . Asthma Neg Hx   . Eczema Neg Hx   . Urticaria Neg Hx     Review of Systems  Constitutional: Positive for fatigue.  All other systems reviewed and are negative.    Vital signs: -Deferred due to telephone visit  Physical Exam -Deferred due to telephone visit -Patient was alert and oriented over the phone in no acute distress.   LABORATORY DATA:  CBC    Component Value Date/Time   WBC 7.7 10/18/2020 0843   RBC 3.57 (L) 10/18/2020 0843   HGB 10.2 (L) 10/18/2020 0843   HGB 12.0 06/28/2020 1146   HCT 34.8 (L) 10/18/2020 0843   HCT 36.8 06/28/2020 1146   PLT 395 10/18/2020 0843   PLT 297 06/28/2020 1146   MCV 97.5 10/18/2020 0843   MCV 86 06/28/2020 1146   MCH 28.6 10/18/2020 0843    MCHC 29.3 (L) 10/18/2020 0843   RDW 15.7 (H) 10/18/2020 0843   RDW 12.3 06/28/2020 1146   LYMPHSABS 1.6 10/18/2020  0843   LYMPHSABS 1.9 06/28/2020 1146   MONOABS 1.4 (H) 10/18/2020 0843   EOSABS 0.6 (H) 10/18/2020 0843   EOSABS 0.7 (H) 06/28/2020 1146   BASOSABS 0.1 10/18/2020 0843   BASOSABS 0.2 06/28/2020 1146    CMP     Component Value Date/Time   NA 137 10/18/2020 0843   NA 138 06/28/2020 1146   K 4.3 10/18/2020 0843   CL 102 10/18/2020 0843   CO2 27 10/18/2020 0843   GLUCOSE 145 (H) 10/18/2020 0843   BUN 22 10/18/2020 0843   BUN 13 06/28/2020 1146   CREATININE 0.92 10/18/2020 0843   CREATININE 0.62 12/23/2012 1140   CALCIUM 8.3 (L) 10/18/2020 0843   PROT 6.6 10/18/2020 0843   PROT 6.6 06/28/2020 1146   ALBUMIN 3.1 (L) 10/18/2020 0843   ALBUMIN 3.9 06/28/2020 1146   AST 18 10/18/2020 0843   ALT 13 10/18/2020 0843   ALKPHOS 96 10/18/2020 0843   BILITOT 0.6 10/18/2020 0843   BILITOT <0.2 06/28/2020 1146   GFRNONAA >60 10/18/2020 0843   GFRAA 60 (L) 07/25/2020 1035    All questions were answered to patient's stated satisfaction. Encouraged patient to call with any new concerns or questions before his next visit to the cancer center and we can certain see him sooner, if needed.     ASSESSMENT and THERAPY PLAN:   1) Iron deficiency anemia:  -The patient would like to return for IV iron in January after she is finished her radiation therapy. -Plans were to contact her for a telemedicine visit in 3 months after labs have been done.  No problem-specific Assessment & Plan notes found for this encounter.   No orders of the defined types were placed in this encounter.   All questions were answered. The patient knows to call the clinic with any problems, questions or concerns. We can certainly see the patient much sooner if necessary. This note was electronically signed.  I provided 28 minutes of non face-to-face telephone visit time during this encounter, and > 50%  was spent counseling as documented under my assessment & plan.   Harle Stanford, PA-C 10/28/2020

## 2020-10-31 ENCOUNTER — Other Ambulatory Visit: Payer: Self-pay | Admitting: Medical

## 2020-10-31 ENCOUNTER — Ambulatory Visit (INDEPENDENT_AMBULATORY_CARE_PROVIDER_SITE_OTHER): Payer: Medicare Other | Admitting: Dermatology

## 2020-10-31 ENCOUNTER — Encounter (HOSPITAL_COMMUNITY): Payer: Self-pay | Admitting: Medical

## 2020-10-31 ENCOUNTER — Encounter: Payer: Self-pay | Admitting: Dermatology

## 2020-10-31 ENCOUNTER — Inpatient Hospital Stay (HOSPITAL_BASED_OUTPATIENT_CLINIC_OR_DEPARTMENT_OTHER): Payer: Medicare Other | Admitting: Medical

## 2020-10-31 ENCOUNTER — Other Ambulatory Visit: Payer: Self-pay

## 2020-10-31 DIAGNOSIS — I10 Essential (primary) hypertension: Secondary | ICD-10-CM | POA: Diagnosis not present

## 2020-10-31 DIAGNOSIS — Z881 Allergy status to other antibiotic agents status: Secondary | ICD-10-CM | POA: Diagnosis not present

## 2020-10-31 DIAGNOSIS — C4440 Unspecified malignant neoplasm of skin of scalp and neck: Secondary | ICD-10-CM | POA: Diagnosis not present

## 2020-10-31 DIAGNOSIS — C44329 Squamous cell carcinoma of skin of other parts of face: Secondary | ICD-10-CM | POA: Diagnosis not present

## 2020-10-31 DIAGNOSIS — D0471 Carcinoma in situ of skin of right lower limb, including hip: Secondary | ICD-10-CM

## 2020-10-31 DIAGNOSIS — E559 Vitamin D deficiency, unspecified: Secondary | ICD-10-CM

## 2020-10-31 DIAGNOSIS — K219 Gastro-esophageal reflux disease without esophagitis: Secondary | ICD-10-CM | POA: Diagnosis not present

## 2020-10-31 DIAGNOSIS — Z885 Allergy status to narcotic agent status: Secondary | ICD-10-CM | POA: Diagnosis not present

## 2020-10-31 DIAGNOSIS — D638 Anemia in other chronic diseases classified elsewhere: Secondary | ICD-10-CM

## 2020-10-31 DIAGNOSIS — D509 Iron deficiency anemia, unspecified: Secondary | ICD-10-CM

## 2020-10-31 DIAGNOSIS — Z923 Personal history of irradiation: Secondary | ICD-10-CM | POA: Diagnosis not present

## 2020-10-31 DIAGNOSIS — Z79891 Long term (current) use of opiate analgesic: Secondary | ICD-10-CM | POA: Diagnosis not present

## 2020-10-31 DIAGNOSIS — Z944 Liver transplant status: Secondary | ICD-10-CM | POA: Diagnosis not present

## 2020-10-31 DIAGNOSIS — E079 Disorder of thyroid, unspecified: Secondary | ICD-10-CM | POA: Diagnosis not present

## 2020-10-31 DIAGNOSIS — Z8719 Personal history of other diseases of the digestive system: Secondary | ICD-10-CM | POA: Diagnosis not present

## 2020-10-31 DIAGNOSIS — Z800 Family history of malignant neoplasm of digestive organs: Secondary | ICD-10-CM | POA: Diagnosis not present

## 2020-10-31 DIAGNOSIS — Z79899 Other long term (current) drug therapy: Secondary | ICD-10-CM | POA: Diagnosis not present

## 2020-10-31 DIAGNOSIS — E039 Hypothyroidism, unspecified: Secondary | ICD-10-CM | POA: Diagnosis not present

## 2020-10-31 NOTE — Progress Notes (Signed)
Referral sent to skin  Surgery center for patient to have MOHS Right lower leg-Mid  By Dr. Velia Meyer in [place].

## 2020-11-01 ENCOUNTER — Encounter: Payer: Self-pay | Admitting: Dermatology

## 2020-11-01 DIAGNOSIS — K219 Gastro-esophageal reflux disease without esophagitis: Secondary | ICD-10-CM | POA: Diagnosis not present

## 2020-11-01 DIAGNOSIS — Z79899 Other long term (current) drug therapy: Secondary | ICD-10-CM | POA: Diagnosis not present

## 2020-11-01 DIAGNOSIS — Z885 Allergy status to narcotic agent status: Secondary | ICD-10-CM | POA: Diagnosis not present

## 2020-11-01 DIAGNOSIS — E079 Disorder of thyroid, unspecified: Secondary | ICD-10-CM | POA: Diagnosis not present

## 2020-11-01 DIAGNOSIS — Z881 Allergy status to other antibiotic agents status: Secondary | ICD-10-CM | POA: Diagnosis not present

## 2020-11-01 DIAGNOSIS — C44329 Squamous cell carcinoma of skin of other parts of face: Secondary | ICD-10-CM | POA: Diagnosis not present

## 2020-11-01 DIAGNOSIS — Z923 Personal history of irradiation: Secondary | ICD-10-CM | POA: Diagnosis not present

## 2020-11-01 DIAGNOSIS — Z800 Family history of malignant neoplasm of digestive organs: Secondary | ICD-10-CM | POA: Diagnosis not present

## 2020-11-01 DIAGNOSIS — Z79891 Long term (current) use of opiate analgesic: Secondary | ICD-10-CM | POA: Diagnosis not present

## 2020-11-01 NOTE — Progress Notes (Signed)
   Follow-Up Visit   Subjective  Cassidy Barber is a 69 y.o. female who presents for the following: Follow-up (Patient here today for non healing spot on right mid lower leg that was previously biopsied and treated on 08/02/20.  Per patient the spot is red and painful. Per patient she's been using Mupirocin ).  Open spot right shin Location:  Duration:  Quality:  Associated Signs/Symptoms: Modifying Factors:  Severity:  Timing: Context: Biopsy showed squamous cell carcinoma which clearly was not cleared with treatment during biopsy.  Chronic immunosuppression status post liver transplant.  Lynch syndrome.  Objective  Well appearing patient in no apparent distress; mood and affect are within normal limits. Objective  Right Lower Leg - Anterior: 3 cm erosion at site of biopsy-proven squamous cell carcinoma    A focused examination was performed including Legs.. Relevant physical exam findings are noted in the Assessment and Plan.   Assessment & Plan    Squamous cell carcinoma in situ of skin of right lower leg Right Lower Leg - Anterior  Biopsy done within the past year so no need to rebiopsy.  Will refer to Dr. Debbrah Alar for Mohs surgery in [place].      I, Lavonna Monarch, MD, have reviewed all documentation for this visit.  The documentation on 11/01/20 for the exam, diagnosis, procedures, and orders are all accurate and complete.

## 2020-11-02 DIAGNOSIS — Z881 Allergy status to other antibiotic agents status: Secondary | ICD-10-CM | POA: Diagnosis not present

## 2020-11-02 DIAGNOSIS — Z800 Family history of malignant neoplasm of digestive organs: Secondary | ICD-10-CM | POA: Diagnosis not present

## 2020-11-02 DIAGNOSIS — Z79891 Long term (current) use of opiate analgesic: Secondary | ICD-10-CM | POA: Diagnosis not present

## 2020-11-02 DIAGNOSIS — K219 Gastro-esophageal reflux disease without esophagitis: Secondary | ICD-10-CM | POA: Diagnosis not present

## 2020-11-02 DIAGNOSIS — C44329 Squamous cell carcinoma of skin of other parts of face: Secondary | ICD-10-CM | POA: Diagnosis not present

## 2020-11-02 DIAGNOSIS — E079 Disorder of thyroid, unspecified: Secondary | ICD-10-CM | POA: Diagnosis not present

## 2020-11-02 DIAGNOSIS — Z885 Allergy status to narcotic agent status: Secondary | ICD-10-CM | POA: Diagnosis not present

## 2020-11-02 DIAGNOSIS — Z923 Personal history of irradiation: Secondary | ICD-10-CM | POA: Diagnosis not present

## 2020-11-02 DIAGNOSIS — Z79899 Other long term (current) drug therapy: Secondary | ICD-10-CM | POA: Diagnosis not present

## 2020-11-03 DIAGNOSIS — E079 Disorder of thyroid, unspecified: Secondary | ICD-10-CM | POA: Diagnosis not present

## 2020-11-03 DIAGNOSIS — Z79899 Other long term (current) drug therapy: Secondary | ICD-10-CM | POA: Diagnosis not present

## 2020-11-03 DIAGNOSIS — Z510 Encounter for antineoplastic radiation therapy: Secondary | ICD-10-CM | POA: Diagnosis not present

## 2020-11-03 DIAGNOSIS — K219 Gastro-esophageal reflux disease without esophagitis: Secondary | ICD-10-CM | POA: Diagnosis not present

## 2020-11-03 DIAGNOSIS — Z79891 Long term (current) use of opiate analgesic: Secondary | ICD-10-CM | POA: Diagnosis not present

## 2020-11-03 DIAGNOSIS — Z881 Allergy status to other antibiotic agents status: Secondary | ICD-10-CM | POA: Diagnosis not present

## 2020-11-03 DIAGNOSIS — C44329 Squamous cell carcinoma of skin of other parts of face: Secondary | ICD-10-CM | POA: Diagnosis not present

## 2020-11-03 DIAGNOSIS — Z800 Family history of malignant neoplasm of digestive organs: Secondary | ICD-10-CM | POA: Diagnosis not present

## 2020-11-03 DIAGNOSIS — Z885 Allergy status to narcotic agent status: Secondary | ICD-10-CM | POA: Diagnosis not present

## 2020-11-03 DIAGNOSIS — Z923 Personal history of irradiation: Secondary | ICD-10-CM | POA: Diagnosis not present

## 2020-11-07 DIAGNOSIS — Z881 Allergy status to other antibiotic agents status: Secondary | ICD-10-CM | POA: Diagnosis not present

## 2020-11-07 DIAGNOSIS — Z79891 Long term (current) use of opiate analgesic: Secondary | ICD-10-CM | POA: Diagnosis not present

## 2020-11-07 DIAGNOSIS — E079 Disorder of thyroid, unspecified: Secondary | ICD-10-CM | POA: Diagnosis not present

## 2020-11-07 DIAGNOSIS — C44329 Squamous cell carcinoma of skin of other parts of face: Secondary | ICD-10-CM | POA: Diagnosis not present

## 2020-11-07 DIAGNOSIS — Z800 Family history of malignant neoplasm of digestive organs: Secondary | ICD-10-CM | POA: Diagnosis not present

## 2020-11-07 DIAGNOSIS — Z885 Allergy status to narcotic agent status: Secondary | ICD-10-CM | POA: Diagnosis not present

## 2020-11-07 DIAGNOSIS — Z79899 Other long term (current) drug therapy: Secondary | ICD-10-CM | POA: Diagnosis not present

## 2020-11-07 DIAGNOSIS — K219 Gastro-esophageal reflux disease without esophagitis: Secondary | ICD-10-CM | POA: Diagnosis not present

## 2020-11-07 DIAGNOSIS — Z923 Personal history of irradiation: Secondary | ICD-10-CM | POA: Diagnosis not present

## 2020-11-08 DIAGNOSIS — Z79891 Long term (current) use of opiate analgesic: Secondary | ICD-10-CM | POA: Diagnosis not present

## 2020-11-08 DIAGNOSIS — Z800 Family history of malignant neoplasm of digestive organs: Secondary | ICD-10-CM | POA: Diagnosis not present

## 2020-11-08 DIAGNOSIS — Z79899 Other long term (current) drug therapy: Secondary | ICD-10-CM | POA: Diagnosis not present

## 2020-11-08 DIAGNOSIS — Z885 Allergy status to narcotic agent status: Secondary | ICD-10-CM | POA: Diagnosis not present

## 2020-11-08 DIAGNOSIS — C44329 Squamous cell carcinoma of skin of other parts of face: Secondary | ICD-10-CM | POA: Diagnosis not present

## 2020-11-08 DIAGNOSIS — K219 Gastro-esophageal reflux disease without esophagitis: Secondary | ICD-10-CM | POA: Diagnosis not present

## 2020-11-08 DIAGNOSIS — E079 Disorder of thyroid, unspecified: Secondary | ICD-10-CM | POA: Diagnosis not present

## 2020-11-08 DIAGNOSIS — Z923 Personal history of irradiation: Secondary | ICD-10-CM | POA: Diagnosis not present

## 2020-11-08 DIAGNOSIS — Z881 Allergy status to other antibiotic agents status: Secondary | ICD-10-CM | POA: Diagnosis not present

## 2020-11-09 DIAGNOSIS — Z944 Liver transplant status: Secondary | ICD-10-CM | POA: Diagnosis not present

## 2020-11-09 DIAGNOSIS — Z79899 Other long term (current) drug therapy: Secondary | ICD-10-CM | POA: Diagnosis not present

## 2020-11-09 DIAGNOSIS — Z923 Personal history of irradiation: Secondary | ICD-10-CM | POA: Diagnosis not present

## 2020-11-09 DIAGNOSIS — E079 Disorder of thyroid, unspecified: Secondary | ICD-10-CM | POA: Diagnosis not present

## 2020-11-09 DIAGNOSIS — Z79891 Long term (current) use of opiate analgesic: Secondary | ICD-10-CM | POA: Diagnosis not present

## 2020-11-09 DIAGNOSIS — C44329 Squamous cell carcinoma of skin of other parts of face: Secondary | ICD-10-CM | POA: Diagnosis not present

## 2020-11-09 DIAGNOSIS — K219 Gastro-esophageal reflux disease without esophagitis: Secondary | ICD-10-CM | POA: Diagnosis not present

## 2020-11-09 DIAGNOSIS — Z800 Family history of malignant neoplasm of digestive organs: Secondary | ICD-10-CM | POA: Diagnosis not present

## 2020-11-09 DIAGNOSIS — Z881 Allergy status to other antibiotic agents status: Secondary | ICD-10-CM | POA: Diagnosis not present

## 2020-11-09 DIAGNOSIS — Z885 Allergy status to narcotic agent status: Secondary | ICD-10-CM | POA: Diagnosis not present

## 2020-11-10 DIAGNOSIS — C44329 Squamous cell carcinoma of skin of other parts of face: Secondary | ICD-10-CM | POA: Diagnosis not present

## 2020-11-10 DIAGNOSIS — Z800 Family history of malignant neoplasm of digestive organs: Secondary | ICD-10-CM | POA: Diagnosis not present

## 2020-11-10 DIAGNOSIS — Z885 Allergy status to narcotic agent status: Secondary | ICD-10-CM | POA: Diagnosis not present

## 2020-11-10 DIAGNOSIS — Z923 Personal history of irradiation: Secondary | ICD-10-CM | POA: Diagnosis not present

## 2020-11-10 DIAGNOSIS — Z881 Allergy status to other antibiotic agents status: Secondary | ICD-10-CM | POA: Diagnosis not present

## 2020-11-10 DIAGNOSIS — E079 Disorder of thyroid, unspecified: Secondary | ICD-10-CM | POA: Diagnosis not present

## 2020-11-10 DIAGNOSIS — K219 Gastro-esophageal reflux disease without esophagitis: Secondary | ICD-10-CM | POA: Diagnosis not present

## 2020-11-10 DIAGNOSIS — Z79899 Other long term (current) drug therapy: Secondary | ICD-10-CM | POA: Diagnosis not present

## 2020-11-10 DIAGNOSIS — Z79891 Long term (current) use of opiate analgesic: Secondary | ICD-10-CM | POA: Diagnosis not present

## 2020-11-15 DIAGNOSIS — C44329 Squamous cell carcinoma of skin of other parts of face: Secondary | ICD-10-CM | POA: Diagnosis not present

## 2020-11-15 DIAGNOSIS — Z7982 Long term (current) use of aspirin: Secondary | ICD-10-CM | POA: Diagnosis not present

## 2020-11-15 DIAGNOSIS — Z79891 Long term (current) use of opiate analgesic: Secondary | ICD-10-CM | POA: Diagnosis not present

## 2020-11-15 DIAGNOSIS — Z79899 Other long term (current) drug therapy: Secondary | ICD-10-CM | POA: Diagnosis not present

## 2020-11-15 DIAGNOSIS — Z944 Liver transplant status: Secondary | ICD-10-CM | POA: Diagnosis not present

## 2020-11-15 DIAGNOSIS — Z85038 Personal history of other malignant neoplasm of large intestine: Secondary | ICD-10-CM | POA: Diagnosis not present

## 2020-11-15 DIAGNOSIS — Z886 Allergy status to analgesic agent status: Secondary | ICD-10-CM | POA: Diagnosis not present

## 2020-11-15 DIAGNOSIS — Z9889 Other specified postprocedural states: Secondary | ICD-10-CM | POA: Diagnosis not present

## 2020-11-15 DIAGNOSIS — K219 Gastro-esophageal reflux disease without esophagitis: Secondary | ICD-10-CM | POA: Diagnosis not present

## 2020-11-15 DIAGNOSIS — Z510 Encounter for antineoplastic radiation therapy: Secondary | ICD-10-CM | POA: Diagnosis not present

## 2020-11-16 DIAGNOSIS — Z944 Liver transplant status: Secondary | ICD-10-CM | POA: Diagnosis not present

## 2020-11-16 DIAGNOSIS — Z79899 Other long term (current) drug therapy: Secondary | ICD-10-CM | POA: Diagnosis not present

## 2020-11-16 DIAGNOSIS — Z79891 Long term (current) use of opiate analgesic: Secondary | ICD-10-CM | POA: Diagnosis not present

## 2020-11-16 DIAGNOSIS — Z886 Allergy status to analgesic agent status: Secondary | ICD-10-CM | POA: Diagnosis not present

## 2020-11-16 DIAGNOSIS — Z9889 Other specified postprocedural states: Secondary | ICD-10-CM | POA: Diagnosis not present

## 2020-11-16 DIAGNOSIS — Z510 Encounter for antineoplastic radiation therapy: Secondary | ICD-10-CM | POA: Diagnosis not present

## 2020-11-16 DIAGNOSIS — Z7982 Long term (current) use of aspirin: Secondary | ICD-10-CM | POA: Diagnosis not present

## 2020-11-16 DIAGNOSIS — C44329 Squamous cell carcinoma of skin of other parts of face: Secondary | ICD-10-CM | POA: Diagnosis not present

## 2020-11-16 DIAGNOSIS — Z85038 Personal history of other malignant neoplasm of large intestine: Secondary | ICD-10-CM | POA: Diagnosis not present

## 2020-11-16 DIAGNOSIS — K219 Gastro-esophageal reflux disease without esophagitis: Secondary | ICD-10-CM | POA: Diagnosis not present

## 2020-11-17 DIAGNOSIS — Z7982 Long term (current) use of aspirin: Secondary | ICD-10-CM | POA: Diagnosis not present

## 2020-11-17 DIAGNOSIS — Z79899 Other long term (current) drug therapy: Secondary | ICD-10-CM | POA: Diagnosis not present

## 2020-11-17 DIAGNOSIS — Z886 Allergy status to analgesic agent status: Secondary | ICD-10-CM | POA: Diagnosis not present

## 2020-11-17 DIAGNOSIS — Z944 Liver transplant status: Secondary | ICD-10-CM | POA: Diagnosis not present

## 2020-11-17 DIAGNOSIS — Z85038 Personal history of other malignant neoplasm of large intestine: Secondary | ICD-10-CM | POA: Diagnosis not present

## 2020-11-17 DIAGNOSIS — C44329 Squamous cell carcinoma of skin of other parts of face: Secondary | ICD-10-CM | POA: Diagnosis not present

## 2020-11-17 DIAGNOSIS — Z9889 Other specified postprocedural states: Secondary | ICD-10-CM | POA: Diagnosis not present

## 2020-11-17 DIAGNOSIS — K219 Gastro-esophageal reflux disease without esophagitis: Secondary | ICD-10-CM | POA: Diagnosis not present

## 2020-11-17 DIAGNOSIS — Z79891 Long term (current) use of opiate analgesic: Secondary | ICD-10-CM | POA: Diagnosis not present

## 2020-11-17 DIAGNOSIS — Z510 Encounter for antineoplastic radiation therapy: Secondary | ICD-10-CM | POA: Diagnosis not present

## 2020-11-18 DIAGNOSIS — Z944 Liver transplant status: Secondary | ICD-10-CM | POA: Diagnosis not present

## 2020-11-18 DIAGNOSIS — C44329 Squamous cell carcinoma of skin of other parts of face: Secondary | ICD-10-CM | POA: Diagnosis not present

## 2020-11-18 DIAGNOSIS — Z886 Allergy status to analgesic agent status: Secondary | ICD-10-CM | POA: Diagnosis not present

## 2020-11-18 DIAGNOSIS — Z510 Encounter for antineoplastic radiation therapy: Secondary | ICD-10-CM | POA: Diagnosis not present

## 2020-11-18 DIAGNOSIS — Z85038 Personal history of other malignant neoplasm of large intestine: Secondary | ICD-10-CM | POA: Diagnosis not present

## 2020-11-18 DIAGNOSIS — Z79891 Long term (current) use of opiate analgesic: Secondary | ICD-10-CM | POA: Diagnosis not present

## 2020-11-18 DIAGNOSIS — Z79899 Other long term (current) drug therapy: Secondary | ICD-10-CM | POA: Diagnosis not present

## 2020-11-18 DIAGNOSIS — Z7982 Long term (current) use of aspirin: Secondary | ICD-10-CM | POA: Diagnosis not present

## 2020-11-18 DIAGNOSIS — K219 Gastro-esophageal reflux disease without esophagitis: Secondary | ICD-10-CM | POA: Diagnosis not present

## 2020-11-18 DIAGNOSIS — Z9889 Other specified postprocedural states: Secondary | ICD-10-CM | POA: Diagnosis not present

## 2020-11-21 DIAGNOSIS — Z510 Encounter for antineoplastic radiation therapy: Secondary | ICD-10-CM | POA: Diagnosis not present

## 2020-11-21 DIAGNOSIS — Z7982 Long term (current) use of aspirin: Secondary | ICD-10-CM | POA: Diagnosis not present

## 2020-11-21 DIAGNOSIS — Z79899 Other long term (current) drug therapy: Secondary | ICD-10-CM | POA: Diagnosis not present

## 2020-11-21 DIAGNOSIS — C44329 Squamous cell carcinoma of skin of other parts of face: Secondary | ICD-10-CM | POA: Diagnosis not present

## 2020-11-21 DIAGNOSIS — K219 Gastro-esophageal reflux disease without esophagitis: Secondary | ICD-10-CM | POA: Diagnosis not present

## 2020-11-21 DIAGNOSIS — Z85038 Personal history of other malignant neoplasm of large intestine: Secondary | ICD-10-CM | POA: Diagnosis not present

## 2020-11-21 DIAGNOSIS — Z944 Liver transplant status: Secondary | ICD-10-CM | POA: Diagnosis not present

## 2020-11-21 DIAGNOSIS — Z886 Allergy status to analgesic agent status: Secondary | ICD-10-CM | POA: Diagnosis not present

## 2020-11-21 DIAGNOSIS — Z9889 Other specified postprocedural states: Secondary | ICD-10-CM | POA: Diagnosis not present

## 2020-11-21 DIAGNOSIS — Z79891 Long term (current) use of opiate analgesic: Secondary | ICD-10-CM | POA: Diagnosis not present

## 2020-11-23 DIAGNOSIS — C44722 Squamous cell carcinoma of skin of right lower limb, including hip: Secondary | ICD-10-CM | POA: Diagnosis not present

## 2020-11-25 ENCOUNTER — Ambulatory Visit (HOSPITAL_COMMUNITY): Payer: Medicare Other

## 2020-11-29 DIAGNOSIS — Z944 Liver transplant status: Secondary | ICD-10-CM | POA: Diagnosis not present

## 2020-12-02 ENCOUNTER — Inpatient Hospital Stay (HOSPITAL_COMMUNITY): Payer: Medicare Other | Attending: Hematology and Oncology

## 2020-12-02 ENCOUNTER — Other Ambulatory Visit: Payer: Self-pay

## 2020-12-02 DIAGNOSIS — D508 Other iron deficiency anemias: Secondary | ICD-10-CM

## 2020-12-02 DIAGNOSIS — D509 Iron deficiency anemia, unspecified: Secondary | ICD-10-CM | POA: Diagnosis not present

## 2020-12-02 MED ORDER — SODIUM CHLORIDE 0.9 % IV SOLN
510.0000 mg | Freq: Once | INTRAVENOUS | Status: AC
  Administered 2020-12-02: 510 mg via INTRAVENOUS
  Filled 2020-12-02: qty 510

## 2020-12-02 MED ORDER — SODIUM CHLORIDE 0.9 % IV SOLN: INTRAVENOUS | Status: DC

## 2020-12-02 NOTE — Progress Notes (Signed)
Patient presented today for Feraheme infusion.  Vital signs stable.  No new complaints since last visit.  Feraheme infusion given today per MD orders.  Tolerated infusion without adverse affects.  Vital signs stable.  No complaints at this time.  Discharge from clinic ambulatory in stable condition.  Alert and oriented X 3.  Follow up with [facility] as scheduled.

## 2020-12-02 NOTE — Patient Instructions (Signed)
[facility] Discharge Instructions for Patients Receiving Chemotherapy  Today you received the following chemotherapy agents   To help prevent nausea and vomiting after your treatment, we encourage you to take your nausea medication   If you develop nausea and vomiting that is not controlled by your nausea medication, call the clinic.   BELOW ARE SYMPTOMS THAT SHOULD BE REPORTED IMMEDIATELY:  *FEVER GREATER THAN 100.5 F  *CHILLS WITH OR WITHOUT FEVER  NAUSEA AND VOMITING THAT IS NOT CONTROLLED WITH YOUR NAUSEA MEDICATION  *UNUSUAL SHORTNESS OF BREATH  *UNUSUAL BRUISING OR BLEEDING  TENDERNESS IN MOUTH AND THROAT WITH OR WITHOUT PRESENCE OF ULCERS  *URINARY PROBLEMS  *BOWEL PROBLEMS  UNUSUAL RASH Items with * indicate a potential emergency and should be followed up as soon as possible.  Feel free to call the clinic should you have any questions or concerns. The clinic phone number is [phone].  Please show the CHEMO ALERT CARD at check-in to the Emergency Department and triage nurse.   

## 2020-12-05 ENCOUNTER — Other Ambulatory Visit: Payer: Self-pay | Admitting: Family

## 2020-12-05 DIAGNOSIS — R1013 Epigastric pain: Secondary | ICD-10-CM

## 2020-12-05 DIAGNOSIS — K219 Gastro-esophageal reflux disease without esophagitis: Secondary | ICD-10-CM

## 2020-12-06 ENCOUNTER — Other Ambulatory Visit: Payer: Self-pay | Admitting: Family

## 2020-12-06 DIAGNOSIS — E039 Hypothyroidism, unspecified: Secondary | ICD-10-CM

## 2020-12-07 ENCOUNTER — Encounter (INDEPENDENT_AMBULATORY_CARE_PROVIDER_SITE_OTHER): Payer: Self-pay | Admitting: *Deleted

## 2020-12-09 ENCOUNTER — Inpatient Hospital Stay (HOSPITAL_COMMUNITY): Payer: Medicare Other

## 2020-12-09 ENCOUNTER — Other Ambulatory Visit: Payer: Self-pay

## 2020-12-09 VITALS — BP 118/64 | HR 66 | Temp 97.2°F | Resp 18

## 2020-12-09 DIAGNOSIS — D508 Other iron deficiency anemias: Secondary | ICD-10-CM

## 2020-12-09 DIAGNOSIS — D509 Iron deficiency anemia, unspecified: Secondary | ICD-10-CM | POA: Diagnosis not present

## 2020-12-09 MED ORDER — SODIUM CHLORIDE 0.9 % IV SOLN
200.0000 mg | Freq: Once | INTRAVENOUS | Status: AC
  Administered 2020-12-09: 200 mg via INTRAVENOUS
  Filled 2020-12-09: qty 200

## 2020-12-09 MED ORDER — SODIUM CHLORIDE 0.9 % IV SOLN: Freq: Once | INTRAVENOUS | Status: AC

## 2020-12-09 NOTE — Patient Instructions (Signed)
[facility] Discharge Instructions for Patients Receiving   Today you received the following  To help prevent nausea and vomiting after your treatment, we encourage you to take your nausea medication   If you develop nausea and vomiting that is not controlled by your nausea medication, call the clinic.   BELOW ARE SYMPTOMS THAT SHOULD BE REPORTED IMMEDIATELY:  *FEVER GREATER THAN 100.5 F  *CHILLS WITH OR WITHOUT FEVER  NAUSEA AND VOMITING THAT IS NOT CONTROLLED WITH YOUR NAUSEA MEDICATION  *UNUSUAL SHORTNESS OF BREATH  *UNUSUAL BRUISING OR BLEEDING  TENDERNESS IN MOUTH AND THROAT WITH OR WITHOUT PRESENCE OF ULCERS  *URINARY PROBLEMS  *BOWEL PROBLEMS  UNUSUAL RASH Items with * indicate a potential emergency and should be followed up as soon as possible.  Feel free to call the clinic should you have any questions or concerns. The clinic phone number is [phone].  Please show the CHEMO ALERT CARD at check-in to the Emergency Department and triage nurse.   

## 2020-12-09 NOTE — Progress Notes (Signed)
Patient presents today for Venofer infusion.  Vital signs WNL.  No new complaints since last visit.  Treatment given today per MD orders.  Tolerated infusion without adverse affects.  Vital signs stable.  No complaints at this time.  Discharge from clinic ambulatory in stable condition.  Alert and oriented X 3.  Follow up with [facility] as scheduled.

## 2020-12-12 ENCOUNTER — Ambulatory Visit (HOSPITAL_COMMUNITY): Payer: Medicare Other

## 2020-12-12 ENCOUNTER — Inpatient Hospital Stay (HOSPITAL_COMMUNITY): Payer: Medicare Other

## 2020-12-12 ENCOUNTER — Encounter (HOSPITAL_COMMUNITY): Payer: Self-pay

## 2020-12-12 VITALS — BP 131/73 | HR 63 | Temp 97.3°F | Resp 18

## 2020-12-12 DIAGNOSIS — H01004 Unspecified blepharitis left upper eyelid: Secondary | ICD-10-CM | POA: Diagnosis not present

## 2020-12-12 DIAGNOSIS — H01001 Unspecified blepharitis right upper eyelid: Secondary | ICD-10-CM | POA: Diagnosis not present

## 2020-12-12 DIAGNOSIS — D508 Other iron deficiency anemias: Secondary | ICD-10-CM

## 2020-12-12 DIAGNOSIS — D509 Iron deficiency anemia, unspecified: Secondary | ICD-10-CM | POA: Diagnosis not present

## 2020-12-12 DIAGNOSIS — H01002 Unspecified blepharitis right lower eyelid: Secondary | ICD-10-CM | POA: Diagnosis not present

## 2020-12-12 DIAGNOSIS — H1711 Central corneal opacity, right eye: Secondary | ICD-10-CM | POA: Diagnosis not present

## 2020-12-12 MED ORDER — SODIUM CHLORIDE 0.9 % IV SOLN
300.0000 mg | Freq: Once | INTRAVENOUS | Status: AC
  Administered 2020-12-12: 300 mg via INTRAVENOUS
  Filled 2020-12-12: qty 15

## 2020-12-12 MED ORDER — SODIUM CHLORIDE 0.9 % IV SOLN: Freq: Once | INTRAVENOUS | Status: AC

## 2020-12-12 NOTE — Progress Notes (Signed)
Tolerated iron infusion well today without incidence.  Discharged ambulatory in stable condition.  Vital signs stable prior to discharge. Pt refused the 30 minute wait time.

## 2020-12-12 NOTE — Patient Instructions (Signed)
[facility] at [facility]  Discharge Instructions:   _______________________________________________________________  Thank you for choosing [facility] at [facility] to provide your oncology and hematology care.  To afford each patient quality time with our providers, please arrive at least 15 minutes before your scheduled appointment.  You need to re-schedule your appointment if you arrive 10 or more minutes late.  We strive to give you quality time with our providers, and arriving late affects you and other patients whose appointments are after yours.  Also, if you no show three or more times for appointments you may be dismissed from the clinic.  Again, thank you for choosing [facility] at [facility]. Our hope is that these requests will allow you access to exceptional care and in a timely manner. _______________________________________________________________  If you have questions after your visit, please contact our office at [phone] between the hours of 8:30 a.m. and 5:00 p.m. Voicemails left after 4:30 p.m. will not be returned until the following business day. _______________________________________________________________  For prescription refill requests, have your pharmacy contact our office. _______________________________________________________________  Recommendations made by the consultant and any test results will be sent to your referring physician. _______________________________________________________________ 

## 2020-12-13 ENCOUNTER — Other Ambulatory Visit: Payer: Self-pay

## 2020-12-13 ENCOUNTER — Other Ambulatory Visit: Payer: Medicare Other

## 2020-12-13 DIAGNOSIS — D849 Immunodeficiency, unspecified: Secondary | ICD-10-CM | POA: Diagnosis not present

## 2020-12-13 DIAGNOSIS — E039 Hypothyroidism, unspecified: Secondary | ICD-10-CM | POA: Diagnosis not present

## 2020-12-13 DIAGNOSIS — Z944 Liver transplant status: Secondary | ICD-10-CM | POA: Diagnosis not present

## 2020-12-14 LAB — TSH: TSH: 2.07 u[IU]/mL (ref 0.450–4.500)

## 2020-12-20 ENCOUNTER — Other Ambulatory Visit: Payer: Self-pay | Admitting: Family

## 2020-12-20 DIAGNOSIS — L509 Urticaria, unspecified: Secondary | ICD-10-CM

## 2020-12-21 ENCOUNTER — Other Ambulatory Visit: Payer: Self-pay

## 2020-12-21 ENCOUNTER — Encounter: Payer: Self-pay | Admitting: Physician Assistant

## 2020-12-21 ENCOUNTER — Ambulatory Visit: Payer: Medicare Other | Admitting: Physician Assistant

## 2020-12-21 DIAGNOSIS — C44329 Squamous cell carcinoma of skin of other parts of face: Secondary | ICD-10-CM

## 2020-12-21 DIAGNOSIS — C44320 Squamous cell carcinoma of skin of unspecified parts of face: Secondary | ICD-10-CM

## 2020-12-21 DIAGNOSIS — C44529 Squamous cell carcinoma of skin of other part of trunk: Secondary | ICD-10-CM | POA: Diagnosis not present

## 2020-12-21 DIAGNOSIS — L910 Hypertrophic scar: Secondary | ICD-10-CM

## 2020-12-21 DIAGNOSIS — D485 Neoplasm of uncertain behavior of skin: Secondary | ICD-10-CM

## 2020-12-21 DIAGNOSIS — C44629 Squamous cell carcinoma of skin of left upper limb, including shoulder: Secondary | ICD-10-CM

## 2020-12-21 DIAGNOSIS — D489 Neoplasm of uncertain behavior, unspecified: Secondary | ICD-10-CM

## 2020-12-21 NOTE — Patient Instructions (Signed)

## 2020-12-22 DIAGNOSIS — Z944 Liver transplant status: Secondary | ICD-10-CM | POA: Diagnosis not present

## 2020-12-29 ENCOUNTER — Telehealth: Payer: Self-pay

## 2020-12-29 NOTE — Telephone Encounter (Signed)
Phone call to patient with her pathology results and Kelli's recommendations.  Patient aware.

## 2020-12-29 NOTE — Telephone Encounter (Signed)
Release:  [id number] Completed today to the skin sugery

## 2020-12-29 NOTE — Telephone Encounter (Signed)
-----   Message from [name], [place] sent at 12/28/2020  5:13 PM EST ----- 1. Mohs 2,3 1 hour

## 2021-01-02 ENCOUNTER — Other Ambulatory Visit: Payer: Self-pay | Admitting: Family

## 2021-01-02 ENCOUNTER — Other Ambulatory Visit: Payer: Self-pay

## 2021-01-02 ENCOUNTER — Ambulatory Visit (INDEPENDENT_AMBULATORY_CARE_PROVIDER_SITE_OTHER): Payer: Medicare Other

## 2021-01-02 DIAGNOSIS — Z79899 Other long term (current) drug therapy: Secondary | ICD-10-CM

## 2021-01-02 DIAGNOSIS — F411 Generalized anxiety disorder: Secondary | ICD-10-CM

## 2021-01-02 DIAGNOSIS — F1320 Sedative, hypnotic or anxiolytic dependence, uncomplicated: Secondary | ICD-10-CM

## 2021-01-02 DIAGNOSIS — Z23 Encounter for immunization: Secondary | ICD-10-CM

## 2021-01-02 NOTE — Progress Notes (Signed)
Hep A vaccine given to patient and tolerated well.

## 2021-01-03 ENCOUNTER — Encounter: Payer: Self-pay | Admitting: Physician Assistant

## 2021-01-03 NOTE — Progress Notes (Addendum)
Follow-Up Visit   Subjective  Cassidy Barber is a 70 y.o. female who presents for the following: Follow-up (MID AND LEFT CHEST NEW SPOTS).   The following portions of the chart were reviewed this encounter and updated as appropriate:  Tobacco  Allergies  Meds  Problems  Med Hx  Surg Hx  Fam Hx      Objective  Well appearing patient in no apparent distress; mood and affect are within normal limits.  A full examination was performed including scalp, head, eyes, ears, nose, lips, neck, chest, axillae, abdomen, back, buttocks, bilateral upper extremities, bilateral lower extremities, hands, feet, fingers, toes, fingernails, and toenails. All findings within normal limits unless otherwise noted below.  Left Upper Chest       Left Forearm - Posterior       Objective  Chest - Medial [facility]): Firm pink dermal papules/plaques c/w keloid.   Objective  Right Temple: Hyperkeratotic scale with pink base        Assessment & Plan  Neoplasm of uncertain behavior of skin (2) Left Upper Chest  Skin / nail biopsy Type of biopsy: tangential   Informed consent: discussed and consent obtained   Timeout: patient name, date of birth, surgical site, and procedure verified   Procedure prep:  Patient was prepped and draped in usual sterile fashion (Non sterile) Prep type:  Chlorhexidine Anesthesia: the lesion was anesthetized in a standard fashion   Anesthetic:  1% lidocaine w/ epinephrine 1-100,000 local infiltration Instrument used: flexible razor blade   Hemostasis achieved with: aluminum chloride   Outcome: patient tolerated procedure well   Post-procedure details: sterile dressing applied and wound care instructions given   Dressing type: bandage and petrolatum    Specimen 2 - Surgical pathology Differential Diagnosis: R/O BCC vs SCC  Check Margins: No  Left Forearm - Posterior  Skin / nail biopsy Type of biopsy: tangential   Informed consent: discussed and  consent obtained   Timeout: patient name, date of birth, surgical site, and procedure verified   Procedure prep:  Patient was prepped and draped in usual sterile fashion (Non sterile) Prep type:  Chlorhexidine Anesthesia: the lesion was anesthetized in a standard fashion   Anesthetic:  1% lidocaine w/ epinephrine 1-100,000 local infiltration Instrument used: flexible razor blade   Hemostasis achieved with: aluminum chloride and electrodesiccation   Outcome: patient tolerated procedure well   Post-procedure details: sterile dressing applied and wound care instructions given   Dressing type: bandage and petrolatum    Specimen 3 - Surgical pathology Differential Diagnosis: R/O BCC vs SCC  Check Margins: No  Keloid Chest - Medial (Center)  Intralesional injection - Chest - Medial (Center) Location: Upper Mid Chest  Informed Consent: Discussed risks (infection, pain, bleeding, bruising, thinning of the skin, loss of skin pigment,  Indentation, lack of resolution, and recurrence of lesion) and benefits of the procedure, as well as the alternatives. Informed consent was obtained. Preparation: The area was prepared in a standard fashion.   Procedure Details: An intralesional injection was performed with Kenalog 40 mg/cc. 1cc total were injected.  Total number of injections:2  Plan: The patient was instructed on post-op care. Recommend OTC analgesia as needed for pain.   SCC (squamous cell carcinoma), face Right Temple  Skin / nail biopsy Type of biopsy: tangential   Informed consent: discussed and consent obtained   Timeout: patient name, date of birth, surgical site, and procedure verified   Procedure prep:  Patient was prepped and draped  in usual sterile fashion (Non sterile) Prep type:  Chlorhexidine Anesthesia: the lesion was anesthetized in a standard fashion   Anesthetic:  1% lidocaine w/ epinephrine 1-100,000 local infiltration Instrument used: flexible razor blade    Hemostasis achieved with: aluminum chloride and electrodesiccation   Outcome: patient tolerated procedure well   Post-procedure details: wound care instructions given    Destruction of lesion Complexity: simple   Destruction method: electrodesiccation and curettage   Informed consent: discussed and consent obtained   Timeout:  patient name, date of birth, surgical site, and procedure verified Anesthesia: the lesion was anesthetized in a standard fashion   Anesthetic:  1% lidocaine w/ epinephrine 1-100,000 local infiltration Curettage performed in three different directions: Yes   Electrodesiccation performed over the curetted area: Yes   Curettage cycles:  1 Margin per side (cm):  0.1 Final wound size (cm):  1 Hemostasis achieved with:  aluminum chloride Outcome: patient tolerated procedure well with no complications   Post-procedure details: sterile dressing applied and wound care instructions given   Dressing type: bandage and petrolatum    Specimen 1 - Surgical pathology Differential Diagnosis: R/O BCC vs SCC  Check Margins: No  Treated after biopsy   I, Herb Beltre, PA-C, have reviewed all documentation's for this visit.  The documentation on 01/18/21 for the exam, diagnosis, procedures and orders are all accurate and complete.

## 2021-01-06 ENCOUNTER — Other Ambulatory Visit: Payer: Self-pay

## 2021-01-06 ENCOUNTER — Encounter: Payer: Self-pay | Admitting: Family

## 2021-01-06 ENCOUNTER — Ambulatory Visit (INDEPENDENT_AMBULATORY_CARE_PROVIDER_SITE_OTHER): Payer: Medicare Other | Admitting: Family

## 2021-01-06 VITALS — BP 111/73 | HR 81 | Temp 97.9°F | Ht 64.0 in | Wt 145.6 lb

## 2021-01-06 DIAGNOSIS — Z96612 Presence of left artificial shoulder joint: Secondary | ICD-10-CM

## 2021-01-06 DIAGNOSIS — E039 Hypothyroidism, unspecified: Secondary | ICD-10-CM | POA: Diagnosis not present

## 2021-01-06 DIAGNOSIS — I10 Essential (primary) hypertension: Secondary | ICD-10-CM

## 2021-01-06 DIAGNOSIS — M5432 Sciatica, left side: Secondary | ICD-10-CM

## 2021-01-06 DIAGNOSIS — L509 Urticaria, unspecified: Secondary | ICD-10-CM

## 2021-01-06 DIAGNOSIS — Z79899 Other long term (current) drug therapy: Secondary | ICD-10-CM | POA: Diagnosis not present

## 2021-01-06 DIAGNOSIS — F411 Generalized anxiety disorder: Secondary | ICD-10-CM

## 2021-01-06 DIAGNOSIS — E559 Vitamin D deficiency, unspecified: Secondary | ICD-10-CM

## 2021-01-06 DIAGNOSIS — D638 Anemia in other chronic diseases classified elsewhere: Secondary | ICD-10-CM

## 2021-01-06 DIAGNOSIS — G4700 Insomnia, unspecified: Secondary | ICD-10-CM

## 2021-01-06 DIAGNOSIS — K219 Gastro-esophageal reflux disease without esophagitis: Secondary | ICD-10-CM

## 2021-01-06 DIAGNOSIS — Z944 Liver transplant status: Secondary | ICD-10-CM

## 2021-01-06 DIAGNOSIS — F1320 Sedative, hypnotic or anxiolytic dependence, uncomplicated: Secondary | ICD-10-CM | POA: Diagnosis not present

## 2021-01-06 DIAGNOSIS — R1013 Epigastric pain: Secondary | ICD-10-CM

## 2021-01-06 MED ORDER — LEVOTHYROXINE SODIUM 112 MCG PO TABS: 112.0000 ug | ORAL_TABLET | Freq: Every day | ORAL | 11 refills | Status: DC

## 2021-01-06 MED ORDER — PANTOPRAZOLE SODIUM 40 MG PO TBEC: 40.0000 mg | DELAYED_RELEASE_TABLET | Freq: Two times a day (BID) | ORAL | 0 refills | Status: DC

## 2021-01-06 MED ORDER — ALENDRONATE SODIUM 70 MG PO TABS: ORAL_TABLET | ORAL | 2 refills | Status: DC

## 2021-01-06 MED ORDER — FAMOTIDINE 20 MG PO TABS: 20.0000 mg | ORAL_TABLET | Freq: Two times a day (BID) | ORAL | 0 refills | Status: DC

## 2021-01-06 MED ORDER — METOPROLOL SUCCINATE ER 25 MG PO TB24: 25.0000 mg | ORAL_TABLET | Freq: Every day | ORAL | 4 refills | Status: DC

## 2021-01-06 MED ORDER — ALPRAZOLAM 0.5 MG PO TABS: 0.5000 mg | ORAL_TABLET | Freq: Every day | ORAL | 1 refills | Status: DC

## 2021-01-06 MED ORDER — LOSARTAN POTASSIUM 25 MG PO TABS: ORAL_TABLET | ORAL | 2 refills | Status: DC

## 2021-01-06 MED ORDER — GABAPENTIN 100 MG PO CAPS: 100.0000 mg | ORAL_CAPSULE | Freq: Three times a day (TID) | ORAL | 3 refills | Status: DC

## 2021-01-06 NOTE — Progress Notes (Signed)
Subjective:    Patient ID: Cassidy Barber, female    DOB: 06-20-1951, 70 y.o.   MRN: [id number]  Chief Complaint  Patient presents with  . Hypertension   PT presents to the office today for chronic follow up. She is followed by [facility] for hx of liver transplant. She had an incarcerated ventral hernia with fluid in the hernia sac 04/05/18.She is followed by Hemologists every 3 months for iron deficiency anemia.She had a left reverse shoulder 07/17/18 andthen had to redone in 09/27/19.She currently has skin cancer on her scalp and is completed 30 radiation treatments then had 20 radiation treatments on another.  Hypertension This is a chronic problem. The current episode started more than 1 year ago. The problem has been resolved since onset. The problem is controlled. Associated symptoms include anxiety and malaise/fatigue. Pertinent negatives include no peripheral edema or shortness of breath. Risk factors for coronary artery disease include dyslipidemia, obesity and sedentary lifestyle. The current treatment provides moderate improvement. Identifiable causes of hypertension include a thyroid problem.  Gastroesophageal Reflux She complains of belching, heartburn and a hoarse voice. This is a chronic problem. The current episode started more than 1 year ago. The problem occurs occasionally. The problem has been waxing and waning. Associated symptoms include fatigue. She has tried a PPI for the symptoms. The treatment provided moderate relief.  Thyroid Problem Presents for follow-up visit. Symptoms include anxiety, diarrhea, fatigue and hoarse voice. Patient reports no constipation. The symptoms have been stable.  Arthritis Presents for follow-up visit. She complains of pain and stiffness. The symptoms have been stable. Affected locations include the left hip. Her pain is at a severity of 10/10. Associated symptoms include diarrhea and fatigue. Pertinent negatives include no fever.   Anemia Presents for follow-up visit. Symptoms include malaise/fatigue. There has been no confusion or fever.  Insomnia Primary symptoms: difficulty falling asleep, malaise/fatigue.  The current episode started more than one year. The onset quality is gradual. The problem has been waxing and waning since onset.  Anxiety Presents for follow-up visit. Symptoms include excessive worry, insomnia, irritability and nervous/anxious behavior. Patient reports no confusion or shortness of breath. The severity of symptoms is moderate.   Her past medical history is significant for anemia.      Review of Systems  Constitutional: Positive for fatigue, irritability and malaise/fatigue. Negative for fever.  HENT: Positive for hoarse voice.   Respiratory: Negative for shortness of breath.   Gastrointestinal: Positive for diarrhea and heartburn. Negative for constipation.  Musculoskeletal: Positive for arthritis and stiffness.  Psychiatric/Behavioral: Negative for confusion. The patient is nervous/anxious and has insomnia.   All other systems reviewed and are negative.      Objective:   Physical Exam Vitals reviewed.  Constitutional:      General: She is not in acute distress.    Appearance: She is well-developed and well-nourished.  HENT:     Head: Normocephalic and atraumatic.     Right Ear: Tympanic membrane normal.     Left Ear: Tympanic membrane normal.     Mouth/Throat:     Mouth: Oropharynx is clear and moist.  Eyes:     Pupils: Pupils are equal, round, and reactive to light.  Neck:     Thyroid: No thyromegaly.  Cardiovascular:     Rate and Rhythm: Normal rate and regular rhythm.     Pulses: Intact distal pulses.     Heart sounds: Normal heart sounds. No murmur heard.   Pulmonary:  Effort: Pulmonary effort is normal. No respiratory distress.     Breath sounds: Normal breath sounds. No wheezing.  Abdominal:     General: Bowel sounds are normal. There is no distension.      Palpations: Abdomen is soft.     Tenderness: There is no abdominal tenderness.  Musculoskeletal:        General: No tenderness or edema.     Cervical back: Normal range of motion and neck supple.     Comments: Pain in left buttocks with flexion  Skin:    General: Skin is warm and dry.  Neurological:     Mental Status: She is alert and oriented to person, place, and time.     Cranial Nerves: No cranial nerve deficit.     Deep Tendon Reflexes: Reflexes are normal and symmetric.  Psychiatric:        Mood and Affect: Mood and affect normal.        Behavior: Behavior normal.        Thought Content: Thought content normal.        Judgment: Judgment normal.          BP 111/73   Pulse 81   Temp 97.9 F (36.6 C) (Temporal)   Ht 5\' 4"  (1.626 m)   Wt 145 lb 9.6 oz (66 kg)   BMI 24.99 kg/m   Assessment & Plan:  Cassidy Barber comes in today with chief complaint of Hypertension   Diagnosis and orders addressed:  1. Benzodiazepine dependence (HCC) - ALPRAZolam (XANAX) 0.5 MG tablet; Take 1 tablet (0.5 mg total) by mouth at bedtime.  Dispense: 90 tablet; Refill: 1  2. Controlled substance agreement signed - ALPRAZolam (XANAX) 0.5 MG tablet; Take 1 tablet (0.5 mg total) by mouth at bedtime.  Dispense: 90 tablet; Refill: 1  3. GAD (generalized anxiety disorder) - ALPRAZolam (XANAX) 0.5 MG tablet; Take 1 tablet (0.5 mg total) by mouth at bedtime.  Dispense: 90 tablet; Refill: 1  4. Epigastric pain - pantoprazole (PROTONIX) 40 MG tablet; Take 1 tablet (40 mg total) by mouth 2 (two) times daily.  Dispense: 180 tablet; Refill: 0  5. Gastroesophageal reflux disease, unspecified whether esophagitis present - pantoprazole (PROTONIX) 40 MG tablet; Take 1 tablet (40 mg total) by mouth 2 (two) times daily.  Dispense: 180 tablet; Refill: 0  6. Essential hypertension, benign - metoprolol succinate (TOPROL XL) 25 MG 24 hr tablet; Take 1 tablet (25 mg total) by mouth daily.  Dispense: 90  tablet; Refill: 4 - losartan (COZAAR) 25 MG tablet; TAKE ONE (1) TABLET EACH DAY  Dispense: 90 tablet; Refill: 2  7. Urticaria - famotidine (PEPCID) 20 MG tablet; Take 1 tablet (20 mg total) by mouth 2 (two) times daily. (Needs to be seen before next refill)  Dispense: 60 tablet; Refill: 0  8. Acquired hypothyroidism - Thyroid Panel With TSH; Future  9. Anemia of chronic disease  10. Hx of liver transplant ([place])  11. Liver transplant recipient [facility])  12. Vitamin D deficiency  13. Insomnia, unspecified type  14. S/P reverse total shoulder arthroplasty, left  15. Left sciatic nerve pain Will give gabapentin 100 mg TID prn Sedation precaution discussed - gabapentin (NEURONTIN) 100 MG capsule; Take 1 capsule (100 mg total) by mouth 3 (three) times daily.  Dispense: 90 capsule; Refill: 3   Labs pending Health Maintenance reviewed Diet and exercise encouraged  Follow up plan: 6 months   [name], FNP

## 2021-01-06 NOTE — Patient Instructions (Signed)
 Sciatica  Sciatica is pain, weakness, tingling, or loss of feeling (numbness) along the sciatic nerve. The sciatic nerve starts in the lower back and goes down the back of each leg. Sciatica usually goes away on its own or with treatment. Sometimes, sciatica may come back (recur). What are the causes? This condition happens when the sciatic nerve is pinched or has pressure put on it. This may be the result of:  A disk in between the bones of the spine bulging out too far (herniated disk).  Changes in the spinal disks that occur with aging.  A condition that affects a muscle in the butt.  Extra bone growth near the sciatic nerve.  A break (fracture) of the area between your hip bones (pelvis).  Pregnancy.  Tumor. This is rare. What increases the risk? You are more likely to develop this condition if you:  Play sports that put pressure or stress on the spine.  Have poor strength and ease of movement (flexibility).  Have had a back injury in the past.  Have had back surgery.  Sit for long periods of time.  Do activities that involve bending or lifting over and over again.  Are very overweight (obese). What are the signs or symptoms? Symptoms can vary from mild to very bad. They may include:  Any of these problems in the lower back, leg, hip, or butt: ? Mild tingling, loss of feeling, or dull aches. ? Burning sensations. ? Sharp pains.  Loss of feeling in the back of the calf or the sole of the foot.  Leg weakness.  Very bad back pain that makes it hard to move. These symptoms may get worse when you cough, sneeze, or laugh. They may also get worse when you sit or stand for long periods of time. How is this treated? This condition often gets better without any treatment. However, treatment may include:  Changing or cutting back on physical activity when you have pain.  Doing exercises and stretching.  Putting ice or heat on the affected area.  Medicines that  help: ? To relieve pain and swelling. ? To relax your muscles.  Shots (injections) of medicines that help to relieve pain, irritation, and swelling.  Surgery. Follow these instructions at home: Medicines  Take over-the-counter and prescription medicines only as told by your doctor.  Ask your doctor if the medicine prescribed to you: ? Requires you to avoid driving or using heavy machinery. ? Can cause trouble pooping (constipation). You may need to take these steps to prevent or treat trouble pooping:  Drink enough fluids to keep your pee (urine) pale yellow.  Take over-the-counter or prescription medicines.  Eat foods that are high in fiber. These include beans, whole grains, and fresh fruits and vegetables.  Limit foods that are high in fat and sugar. These include fried or sweet foods. Managing pain  If told, put ice on the affected area. ? Put ice in a plastic bag. ? Place a towel between your skin and the bag. ? Leave the ice on for 20 minutes, 2-3 times a day.  If told, put heat on the affected area. Use the heat source that your doctor tells you to use, such as a moist heat pack or a heating pad. ? Place a towel between your skin and the heat source. ? Leave the heat on for 20-30 minutes. ? Remove the heat if your skin turns bright red. This is very important if you are unable to feel   pain, heat, or cold. You may have a greater risk of getting burned.      Activity  Return to your normal activities as told by your doctor. Ask your doctor what activities are safe for you.  Avoid activities that make your symptoms worse.  Take short rests during the day. ? When you rest for a long time, do some physical activity or stretching between periods of rest. ? Avoid sitting for a long time without moving. Get up and move around at least one time each hour.  Exercise and stretch regularly, as told by your doctor.  Do not lift anything that is heavier than 10 lb (4.5 kg)  while you have symptoms of sciatica. ? Avoid lifting heavy things even when you do not have symptoms. ? Avoid lifting heavy things over and over.  When you lift objects, always lift in a way that is safe for your body. To do this, you should: ? Bend your knees. ? Keep the object close to your body. ? Avoid twisting.   General instructions  Stay at a healthy weight.  Wear comfortable shoes that support your feet. Avoid wearing high heels.  Avoid sleeping on a mattress that is too soft or too hard. You might have less pain if you sleep on a mattress that is firm enough to support your back.  Keep all follow-up visits as told by your doctor. This is important. Contact a doctor if:  You have pain that: ? Wakes you up when you are sleeping. ? Gets worse when you lie down. ? Is worse than the pain you have had in the past. ? Lasts longer than 4 weeks.  You lose weight without trying. Get help right away if:  You cannot control when you pee (urinate) or poop (have a bowel movement).  You have weakness in any of these areas and it gets worse: ? Lower back. ? The area between your hip bones. ? Butt. ? Legs.  You have redness or swelling of your back.  You have a burning feeling when you pee. Summary  Sciatica is pain, weakness, tingling, or loss of feeling (numbness) along the sciatic nerve.  This condition happens when the sciatic nerve is pinched or has pressure put on it.  Sciatica can cause pain, tingling, or loss of feeling (numbness) in the lower back, legs, hips, and butt.  Treatment often includes rest, exercise, medicines, and putting ice or heat on the affected area. This information is not intended to replace advice given to you by your health care provider. Make sure you discuss any questions you have with your health care provider. Document Revised: 11/17/2018 Document Reviewed: 11/17/2018 [employer] Patient Education  2021 [employer]     Sciatica  Rehab Ask your health care provider which exercises are safe for you. Do exercises exactly as told by your health care provider and adjust them as directed. It is normal to feel mild stretching, pulling, tightness, or discomfort as you do these exercises. Stop right away if you feel sudden pain or your pain gets worse. Do not begin these exercises until told by your health care provider. Stretching and range-of-motion exercises These exercises warm up your muscles and joints and improve the movement and flexibility of your hips and back. These exercises also help to relieve pain, numbness, and tingling. Sciatic nerve glide 1. Sit in a chair with your head facing down toward your chest. Place your hands behind your back. Let your shoulders slump forward.   2. Slowly straighten one of your legs while you tilt your head back as if you are looking toward the ceiling. Only straighten your leg as far as you can without making your symptoms worse. 3. Hold this position for __________ seconds. 4. Slowly return to the starting position. 5. Repeat with your other leg. Repeat __________ times. Complete this exercise __________ times a day. Knee to chest with hip adduction and internal rotation 1. Lie on your back on a firm surface with both legs straight. 2. Bend one of your knees and move it up toward your chest until you feel a gentle stretch in your lower back and buttock. Then, move your knee toward the shoulder that is on the opposite side from your leg. This is hip adduction and internal rotation. ? Hold your leg in this position by holding on to the front of your knee. 3. Hold this position for __________ seconds. 4. Slowly return to the starting position. 5. Repeat with your other leg. Repeat __________ times. Complete this exercise __________ times a day.   Prone extension on elbows 1. Lie on your abdomen on a firm surface. A bed may be too soft for this exercise. 2. Prop yourself up on your  elbows. 3. Use your arms to help lift your chest up until you feel a gentle stretch in your abdomen and your lower back. ? This will place some of your body weight on your elbows. If this is uncomfortable, try stacking pillows under your chest. ? Your hips should stay down, against the surface that you are lying on. Keep your hip and back muscles relaxed. 4. Hold this position for __________ seconds. 5. Slowly relax your upper body and return to the starting position. Repeat __________ times. Complete this exercise __________ times a day.   Strengthening exercises These exercises build strength and endurance in your back. Endurance is the ability to use your muscles for a long time, even after they get tired. Pelvic tilt This exercise strengthens the muscles that lie deep in the abdomen. 1. Lie on your back on a firm surface. Bend your knees and keep your feet flat on the floor. 2. Tense your abdominal muscles. Tip your pelvis up toward the ceiling and flatten your lower back into the floor. ? To help with this exercise, you may place a small towel under your lower back and try to push your back into the towel. 3. Hold this position for __________ seconds. 4. Let your muscles relax completely before you repeat this exercise. Repeat __________ times. Complete this exercise __________ times a day. Alternating arm and leg raises 1. Get on your hands and knees on a firm surface. If you are on a hard floor, you may want to use padding, such as an exercise mat, to cushion your knees. 2. Line up your arms and legs. Your hands should be directly below your shoulders, and your knees should be directly below your hips. 3. Lift your left leg behind you. At the same time, raise your right arm and straighten it in front of you. ? Do not lift your leg higher than your hip. ? Do not lift your arm higher than your shoulder. ? Keep your abdominal and back muscles tight. ? Keep your hips facing the  ground. ? Do not arch your back. ? Keep your balance carefully, and do not hold your breath. 4. Hold this position for __________ seconds. 5. Slowly return to the starting position. 6. Repeat with your right   leg and your left arm. Repeat __________ times. Complete this exercise __________ times a day.   Posture and body mechanics Good posture and healthy body mechanics can help to relieve stress in your body's tissues and joints. Body mechanics refers to the movements and positions of your body while you do your daily activities. Posture is part of body mechanics. Good posture means:  Your spine is in its natural S-curve position (neutral).  Your shoulders are pulled back slightly.  Your head is not tipped forward. Follow these guidelines to improve your posture and body mechanics in your everyday activities. Standing  When standing, keep your spine neutral and your feet about hip width apart. Keep a slight bend in your knees. Your ears, shoulders, and hips should line up.  When you do a task in which you stand in one place for a long time, place one foot up on a stable object that is 2-4 inches (5-10 cm) high, such as a footstool. This helps keep your spine neutral.   Sitting  When sitting, keep your spine neutral and keep your feet flat on the floor. Use a footrest, if necessary, and keep your thighs parallel to the floor. Avoid rounding your shoulders, and avoid tilting your head forward.  When working at a desk or a computer, keep your desk at a height where your hands are slightly lower than your elbows. Slide your chair under your desk so you are close enough to maintain good posture.  When working at a computer, place your monitor at a height where you are looking straight ahead and you do not have to tilt your head forward or downward to look at the screen.   Resting  When lying down and resting, avoid positions that are most painful for you.  If you have pain with activities  such as sitting, bending, stooping, or squatting, lie in a position in which your body does not bend very much. For example, avoid curling up on your side with your arms and knees near your chest (fetal position).  If you have pain with activities such as standing for a long time or reaching with your arms, lie with your spine in a neutral position and bend your knees slightly. Try the following positions: ? Lying on your side with a pillow between your knees. ? Lying on your back with a pillow under your knees. Lifting  When lifting objects, keep your feet at least shoulder width apart and tighten your abdominal muscles.  Bend your knees and hips and keep your spine neutral. It is important to lift using the strength of your legs, not your back. Do not lock your knees straight out.  Always ask for help to lift heavy or awkward objects.   This information is not intended to replace advice given to you by your health care provider. Make sure you discuss any questions you have with your health care provider. Document Revised: 02/20/2019 Document Reviewed: 11/20/2018 Elsevier Patient Education  2021 [employer]  

## 2021-01-13 ENCOUNTER — Other Ambulatory Visit: Payer: Medicare Other

## 2021-01-13 ENCOUNTER — Other Ambulatory Visit: Payer: Self-pay

## 2021-01-13 DIAGNOSIS — E039 Hypothyroidism, unspecified: Secondary | ICD-10-CM | POA: Diagnosis not present

## 2021-01-13 DIAGNOSIS — D849 Immunodeficiency, unspecified: Secondary | ICD-10-CM | POA: Diagnosis not present

## 2021-01-13 DIAGNOSIS — Z944 Liver transplant status: Secondary | ICD-10-CM | POA: Diagnosis not present

## 2021-01-14 LAB — THYROID PANEL WITH TSH
Free Thyroxine Index: 2.6 (ref 1.2–4.9)
T3 Uptake Ratio: 30 % (ref 24–39)
T4, Total: 8.7 ug/dL (ref 4.5–12.0)
TSH: 1.58 u[IU]/mL (ref 0.450–4.500)

## 2021-01-20 ENCOUNTER — Ambulatory Visit (INDEPENDENT_AMBULATORY_CARE_PROVIDER_SITE_OTHER): Payer: Medicare Other | Admitting: Family

## 2021-01-20 ENCOUNTER — Encounter: Payer: Self-pay | Admitting: Family

## 2021-01-20 DIAGNOSIS — R197 Diarrhea, unspecified: Secondary | ICD-10-CM

## 2021-01-20 MED ORDER — DIPHENOXYLATE-ATROPINE 2.5-0.025 MG PO TABS: 1.0000 | ORAL_TABLET | Freq: Four times a day (QID) | ORAL | 0 refills | Status: DC | PRN

## 2021-01-20 NOTE — Progress Notes (Signed)
   Virtual Visit via telephone Note Due to COVID-19 pandemic this visit was conducted virtually. This visit type was conducted due to national recommendations for restrictions regarding the COVID-19 Pandemic (e.g. social distancing, sheltering in place) in an effort to limit this patient's exposure and mitigate transmission in our community. All issues noted in this document were discussed and addressed.  A physical exam was not performed with this format.  I connected with Cassidy Barber on 01/20/21 at 3:23 pm  by telephone and verified that I am speaking with the correct person using two identifiers. Cassidy Barber is currently located at home  and no one  is currently with her during visit. The provider, [name], FNP is located in their office at time of visit.  I discussed the limitations, risks, security and privacy concerns of performing an evaluation and management service by telephone and the availability of in person appointments. I also discussed with the patient that there may be a patient responsible charge related to this service. The patient expressed understanding and agreed to proceed.   History and Present Illness:  Pt calls the office today with diarrhea that started Wednesday evening. She reports she is having 10+ episodes. She states it is slightly improving, but feels weak.   Diarrhea  This is a new problem. The current episode started in the past 7 days. The problem occurs 5 to 10 times per day. The problem has been unchanged. The stool consistency is described as watery. The patient states that diarrhea awakens her from sleep. Associated symptoms include abdominal pain, bloating, chills, a fever and myalgias. Pertinent negatives include no headaches, increased  flatus or vomiting. She has tried anti-motility drug, bismuth subsalicylate, change of diet and increased fluids for the symptoms. The treatment provided mild relief.     Review of Systems  Constitutional:  Positive for chills and fever.  Gastrointestinal: Positive for abdominal pain, bloating and diarrhea. Negative for flatus and vomiting.  Musculoskeletal: Positive for myalgias.  Neurological: Negative for headaches.  All other systems reviewed and are negative.    Observations/Objective: No SOB or distress noted   Assessment and Plan: 1. Diarrhea, unspecified type Stool panels pending , worrisome for C Diff Force fluids BRAT diet  If abdominal pain worsens go to ED - diphenoxylate-atropine (LOMOTIL) 2.5-0.025 MG tablet; Take 1 tablet by mouth 4 (four) times daily as needed for diarrhea or loose stools.  Dispense: 60 tablet; Refill: 0 - Cdiff NAA+O+P+Stool Culture     I discussed the assessment and treatment plan with the patient. The patient was provided an opportunity to ask questions and all were answered. The patient agreed with the plan and demonstrated an understanding of the instructions.   The patient was advised to call back or seek an in-person evaluation if the symptoms worsen or if the condition fails to improve as anticipated.  The above assessment and management plan was discussed with the patient. The patient verbalized understanding of and has agreed to the management plan. Patient is aware to call the clinic if symptoms persist or worsen. Patient is aware when to return to the clinic for a follow-up visit. Patient educated on when it is appropriate to go to the emergency department.   Time call ended:  3:43 pm   I provided 20 minutes of non-face-to-face time during this encounter.    [name], FNP

## 2021-01-23 ENCOUNTER — Other Ambulatory Visit: Payer: Medicare Other

## 2021-01-23 DIAGNOSIS — R197 Diarrhea, unspecified: Secondary | ICD-10-CM | POA: Diagnosis not present

## 2021-01-25 ENCOUNTER — Other Ambulatory Visit (HOSPITAL_COMMUNITY): Payer: Self-pay

## 2021-01-25 ENCOUNTER — Ambulatory Visit: Payer: Medicare Other | Admitting: Physician Assistant

## 2021-01-25 DIAGNOSIS — D509 Iron deficiency anemia, unspecified: Secondary | ICD-10-CM

## 2021-01-25 DIAGNOSIS — E559 Vitamin D deficiency, unspecified: Secondary | ICD-10-CM

## 2021-01-25 DIAGNOSIS — D75839 Thrombocytosis, unspecified: Secondary | ICD-10-CM

## 2021-01-25 DIAGNOSIS — D508 Other iron deficiency anemias: Secondary | ICD-10-CM

## 2021-01-26 ENCOUNTER — Other Ambulatory Visit: Payer: Self-pay | Admitting: Family

## 2021-01-26 ENCOUNTER — Other Ambulatory Visit: Payer: Self-pay

## 2021-01-26 ENCOUNTER — Inpatient Hospital Stay (HOSPITAL_COMMUNITY): Payer: Medicare Other | Attending: Hematology

## 2021-01-26 ENCOUNTER — Other Ambulatory Visit: Payer: Self-pay | Admitting: Family Medicine

## 2021-01-26 DIAGNOSIS — R197 Diarrhea, unspecified: Secondary | ICD-10-CM

## 2021-01-26 DIAGNOSIS — D509 Iron deficiency anemia, unspecified: Secondary | ICD-10-CM | POA: Diagnosis not present

## 2021-01-26 DIAGNOSIS — D75839 Thrombocytosis, unspecified: Secondary | ICD-10-CM

## 2021-01-26 DIAGNOSIS — E559 Vitamin D deficiency, unspecified: Secondary | ICD-10-CM

## 2021-01-26 LAB — CBC WITH DIFFERENTIAL/PLATELET
Abs Immature Granulocytes: 0.07 10*3/uL (ref 0.00–0.07)
Basophils Absolute: 0.1 10*3/uL (ref 0.0–0.1)
Basophils Relative: 2 %
Eosinophils Absolute: 0.4 10*3/uL (ref 0.0–0.5)
Eosinophils Relative: 4 %
HCT: 35.3 % — ABNORMAL LOW (ref 36.0–46.0)
Hemoglobin: 10.5 g/dL — ABNORMAL LOW (ref 12.0–15.0)
Immature Granulocytes: 1 %
Lymphocytes Relative: 16 %
Lymphs Abs: 1.4 10*3/uL (ref 0.7–4.0)
MCH: 26.6 pg (ref 26.0–34.0)
MCHC: 29.7 g/dL — ABNORMAL LOW (ref 30.0–36.0)
MCV: 89.6 fL (ref 80.0–100.0)
Monocytes Absolute: 1 10*3/uL (ref 0.1–1.0)
Monocytes Relative: 11 %
Neutro Abs: 5.7 10*3/uL (ref 1.7–7.7)
Neutrophils Relative %: 66 %
Platelets: 505 10*3/uL — ABNORMAL HIGH (ref 150–400)
RBC: 3.94 MIL/uL (ref 3.87–5.11)
RDW: 18.9 % — ABNORMAL HIGH (ref 11.5–15.5)
WBC: 8.6 10*3/uL (ref 4.0–10.5)
nRBC: 0 % (ref 0.0–0.2)

## 2021-01-26 LAB — IRON AND TIBC
Iron: 10 ug/dL — ABNORMAL LOW (ref 28–170)
Saturation Ratios: 5 % — ABNORMAL LOW (ref 10.4–31.8)
TIBC: 202 ug/dL — ABNORMAL LOW (ref 250–450)
UIBC: 192 ug/dL

## 2021-01-26 LAB — VITAMIN B12: Vitamin B-12: 4571 pg/mL — ABNORMAL HIGH (ref 180–914)

## 2021-01-26 LAB — FOLATE: Folate: 15.7 ng/mL (ref >5.9)

## 2021-01-26 LAB — COMPREHENSIVE METABOLIC PANEL
ALT: 14 U/L (ref 0–44)
AST: 20 U/L (ref 15–41)
Albumin: 2.8 g/dL — ABNORMAL LOW (ref 3.5–5.0)
Alkaline Phosphatase: 202 U/L — ABNORMAL HIGH (ref 38–126)
Anion gap: 9 (ref 5–15)
BUN: 15 mg/dL (ref 8–23)
CO2: 27 mmol/L (ref 22–32)
Calcium: 8.6 mg/dL — ABNORMAL LOW (ref 8.9–10.3)
Chloride: 97 mmol/L — ABNORMAL LOW (ref 98–111)
Creatinine, Ser: 0.98 mg/dL (ref 0.44–1.00)
GFR, Estimated: >60 mL/min (ref >60)
Glucose, Bld: 108 mg/dL — ABNORMAL HIGH (ref 70–99)
Potassium: 3.4 mmol/L — ABNORMAL LOW (ref 3.5–5.1)
Sodium: 133 mmol/L — ABNORMAL LOW (ref 135–145)
Total Bilirubin: 0.3 mg/dL (ref 0.3–1.2)
Total Protein: 6.9 g/dL (ref 6.5–8.1)

## 2021-01-26 LAB — LACTATE DEHYDROGENASE: LDH: 153 U/L (ref 98–192)

## 2021-01-26 LAB — FERRITIN: Ferritin: 72 ng/mL (ref 11–307)

## 2021-01-26 LAB — VITAMIN D 25 HYDROXY (VIT D DEFICIENCY, FRACTURES): Vit D, 25-Hydroxy: 86.75 ng/mL (ref 30–100)

## 2021-01-26 MED ORDER — DIPHENOXYLATE-ATROPINE 2.5-0.025 MG PO TABS
1.0000 | ORAL_TABLET | Freq: Four times a day (QID) | ORAL | 2 refills | Status: DC | PRN
Start: 2021-01-26 — End: 2021-02-02

## 2021-01-26 MED ORDER — DIPHENOXYLATE-ATROPINE 2.5-0.025 MG PO TABS: 1.0000 | ORAL_TABLET | Freq: Four times a day (QID) | ORAL | 0 refills | Status: DC | PRN

## 2021-01-27 LAB — CDIFF NAA+O+P+STOOL CULTURE
E coli, Shiga toxin Assay: NEGATIVE
Toxigenic C. Difficile by PCR: NEGATIVE

## 2021-01-30 DIAGNOSIS — Z944 Liver transplant status: Secondary | ICD-10-CM | POA: Diagnosis not present

## 2021-02-01 ENCOUNTER — Encounter: Payer: Medicare Other | Admitting: Physician Assistant

## 2021-02-02 ENCOUNTER — Other Ambulatory Visit: Payer: Self-pay

## 2021-02-02 ENCOUNTER — Encounter (INDEPENDENT_AMBULATORY_CARE_PROVIDER_SITE_OTHER): Payer: Self-pay | Admitting: Internal Medicine

## 2021-02-02 ENCOUNTER — Ambulatory Visit (INDEPENDENT_AMBULATORY_CARE_PROVIDER_SITE_OTHER): Payer: Medicare Other | Admitting: Internal Medicine

## 2021-02-02 ENCOUNTER — Inpatient Hospital Stay (HOSPITAL_BASED_OUTPATIENT_CLINIC_OR_DEPARTMENT_OTHER): Payer: Medicare Other | Admitting: Oncology

## 2021-02-02 VITALS — BP 107/69 | HR 94 | Temp 98.9°F | Ht 64.0 in | Wt 143.9 lb

## 2021-02-02 DIAGNOSIS — D508 Other iron deficiency anemias: Secondary | ICD-10-CM

## 2021-02-02 DIAGNOSIS — D509 Iron deficiency anemia, unspecified: Secondary | ICD-10-CM

## 2021-02-02 DIAGNOSIS — R197 Diarrhea, unspecified: Secondary | ICD-10-CM

## 2021-02-02 DIAGNOSIS — D638 Anemia in other chronic diseases classified elsewhere: Secondary | ICD-10-CM

## 2021-02-02 DIAGNOSIS — R109 Unspecified abdominal pain: Secondary | ICD-10-CM

## 2021-02-02 DIAGNOSIS — R748 Abnormal levels of other serum enzymes: Secondary | ICD-10-CM

## 2021-02-02 MED ORDER — CIPROFLOXACIN HCL 250 MG PO TABS: 250.0000 mg | ORAL_TABLET | Freq: Two times a day (BID) | ORAL | 0 refills | Status: DC

## 2021-02-02 MED ORDER — METRONIDAZOLE 250 MG PO TABS: 250.0000 mg | ORAL_TABLET | Freq: Three times a day (TID) | ORAL | 0 refills | Status: DC

## 2021-02-02 MED ORDER — DIPHENOXYLATE-ATROPINE 2.5-0.025 MG PO TABS: 1.0000 | ORAL_TABLET | Freq: Three times a day (TID) | ORAL | 0 refills | Status: DC | PRN

## 2021-02-02 NOTE — Patient Instructions (Signed)
Physician will call with results of stool test and CT when completed.

## 2021-02-02 NOTE — Progress Notes (Addendum)
Presenting complaint;  Diarrhea and abdominal pain.  Database and subjective:  Patient is 70 year old with a complicated GI history which includes chronic GERD, primary biliary cirrhosis for which she underwent live donor liver transplant in January 2015, history of colon carcinoma status post subtotal colectomy, iron deficiency anemia who presents with 2-week history of diarrhea. Her diarrhea started with fever of 103F and chills but she decided not to report to emergency room.  While she did not have fever again diarrhea has continued.  She is having multiple stools a day and nights.  She also has noted abdominal cramping which is primarily across upper abdomen.  She has been watching her diet.  She is trying to drink fluids but her appetite is decreased.  She is not sure if she has lost weight.  Review of weight records reveal no change in her weight since her last visit of June 2021.  She was seen by Ms. [name] FNP and had lab studies as well as stool studies.  Stool studies were negative including testing for C. difficile.  She has been using diphenoxylate on as-needed basis but it is not helping.  She she denies melena or rectal bleeding nausea or vomiting.  Heartburn is well controlled with therapy.  She says she is not able to lie on the left or the right side because of worsening pain. She has a history of chronic diarrhea since she lost most of her colon but stool frequency had improved to no more than 3 soft stools daily that she was not even taking diphenoxylate. Patient states she had dental abscess about a month ago and took amoxicillin for few days. Regarding her iron deficiency anemia she has been receiving iron infusions by oncology clinic.  She is going to the clinic when she leaves here today. She has had multiple skin cancers removed from different regions and more recently she had 1 removed from her scalp.  She says her hair is not coming back.  She had a shave for  surgery.  Current Medications: Outpatient Encounter Medications as of 02/02/2021  Medication Sig  . acetaminophen (TYLENOL) 500 MG tablet Take 1,000 mg by mouth every 6 (six) hours as needed (for pain.).   [name] alendronate (FOSAMAX) 70 MG tablet TAKE 1 TABLET WEEKLY (TAKE WITH 8OZ OF WATER 30 MINUTES BEFORE BREAKFAST)  . ALPRAZolam (XANAX) 0.5 MG tablet Take 1 tablet (0.5 mg total) by mouth at bedtime.  [name] aspirin EC 81 MG tablet Take 81 mg by mouth daily.  . Calcium Citrate-Vitamin D (CALCIUM CITRATE + D3 PO) Take 1 tablet by mouth 2 (two) times daily.  . cetirizine (ZYRTEC) 10 MG tablet TAKE 1 TO 2 TABLETS TWICE DAILY (Patient taking differently: Take 20 mg by mouth daily.)  . clotrimazole-betamethasone (LOTRISONE) cream Apply 1 application topically 2 (two) times daily.  . Cyanocobalamin (VITAMIN B-12) 5000 MCG TBDP Take 5,000 mcg by mouth every Monday, Wednesday, and Friday.   . cyclobenzaprine (FLEXERIL) 5 MG tablet Take 1 tablet (5 mg total) by mouth 3 (three) times daily as needed for muscle spasms.  . diphenoxylate-atropine (LOMOTIL) 2.5-0.025 MG tablet Take 1 tablet by mouth 4 (four) times daily as needed for diarrhea or loose stools.  . famotidine (PEPCID) 20 MG tablet Take 1 tablet (20 mg total) by mouth 2 (two) times daily. (Needs to be seen before next refill)  . fluorouracil (EFUDEX) 5 % cream Apply 1 application topically daily as needed (cancer spots).   . gabapentin (NEURONTIN) 100  MG capsule Take 1 capsule (100 mg total) by mouth 3 (three) times daily.  [name] levothyroxine (SYNTHROID) 112 MCG tablet Take 1 tablet (112 mcg total) by mouth daily. (Patient taking differently: Take 125 mcg by mouth daily.)  . losartan (COZAAR) 25 MG tablet TAKE ONE (1) TABLET EACH DAY  . metoprolol succinate (TOPROL XL) 25 MG 24 hr tablet Take 1 tablet (25 mg total) by mouth daily.  . mupirocin ointment (BACTROBAN) 2 % SMARTSIG:1 Application Topical 2-3 Times Daily  . NIACINAMIDE-ZINC-FOLIC ACID PO Take by  mouth daily.  . pantoprazole (PROTONIX) 40 MG tablet Take 1 tablet (40 mg total) by mouth 2 (two) times daily.  [name] SSD 1 % cream Apply 1 application topically as needed.  . ursodiol (ACTIGALL) 250 MG tablet Take 250 mg by mouth 2 (two) times daily.   . Vitamin D, Ergocalciferol, (DRISDOL) 1.25 MG (50000 UNIT) CAPS capsule TAKE 1 CAPSULE EVERY WEEK (Patient taking differently: Take 50,000 Units by mouth every Thursday.)  . vitamin E 180 MG (400 UNITS) capsule Take 400 Units by mouth daily.  [name] ZORTRESS 0.5 MG TABS Take 2 mg by mouth 2 (two) times daily.   . [DISCONTINUED] predniSONE (DELTASONE) 20 MG tablet 2 po at sametime daily for 5 days (Patient not taking: Reported on 02/02/2021)   No facility-administered encounter medications on file as of 02/02/2021.   Past Medical History:  Diagnosis Date  . Abdominal wall hernia    Incarcerated status post surgical repair 2019 - [facility]  . Anemia of chronic disease   . Atypical nevus 01/21/2018   atypical neoplasm- Left scalp-ant (txpbx + MOHS), atypical neoplasm- Left scalp post- (txpbx + MOHS)  . Basal cell carcinoma   . Colon cancer [facility])    Colon surgery 2005 and 2012  . History of pulmonary hypertension    Pre liver transplant  . History of seizures   . Hypertension   . Hypothyroidism   . Lynch syndrome   . Osteopenia   . Primary biliary cirrhosis (HCC)    Status post liver transplantation - follows at [facility]  . SCCA (squamous cell carcinoma) of skin 02/23/2020   Right Upper Chest(moderate) (MOH's)  . SCCA (squamous cell carcinoma) of skin 04/07/2020   Left Top Leg (Keratoacanthoma) treatment after biopsy  . SCCA (squamous cell carcinoma) of skin 04/07/2020   Left Foot Dorsal (in situ) treatment after biopsy  . Squamous cell carcinoma of skin 04/22/2018   KA-Right mid chest (txpbx), KA-left elbow crease (txpbx), insitu-Right mid chest inf. (exc)  . Squamous cell carcinoma of skin 05/20/2018   well diff-Left upper shin (txpbx), well  diff-Right lower forearm (txpbx), well diff-Right upper shin (txpbx)  . Squamous cell carcinoma of skin 06/11/2018   Scc + margin-Right mid chest inferior   . Squamous cell carcinoma of skin 06/27/2018   well diff-Left mid thigh(txpbx), well diff-Left inner thigh (txpbx),insitu-Right cheek (txpbx),well diff-right inner heel (txpbx)  . Squamous cell carcinoma of skin 09/16/2018   well diff-left shoulder (txpbx), well diff-Right chin (txpbx), well diff-right chest lateral (CX35FU)  . Squamous cell carcinoma of skin 10/01/2018   Right outer lower shin (Txpbx)  . Squamous cell carcinoma of skin 04/03/2019   well diff-Right center chest (MOHS), in situ-Right ear  . Squamous cell carcinoma of skin 08/05/2019   in situ-Left calf (txpbx), in situ-left bicep (txpbx), well diff-Left chest,inf(txpbx), in situ-Right chest inf-(txpbx)  . Squamous cell carcinoma of skin 11/19/2019   KA-left top leg (txpbx), modify-Riight forehead-(mohs), in situ-right  hand (txpbx), in situ-Right forearm (txpbx), well diff-Right chest (txpbx), well diff-chin (txpbx)  . Squamous cell carcinoma of skin 01/06/2020   KA- Left top leg  . Squamous cell carcinoma of skin 05/12/2013   bowens-middle of chest (CX35FU)  . Squamous cell carcinoma of skin 05/18/2015   well diff-Left upper arm (CX35FU + Exc),KA-right chest(txpbx), in situ-Left shin (txpbx), well diff-Right cheek (CX35FU), KA-Left post scalp (CX35FU)  . Squamous cell carcinoma of skin 08/09/2015   KA-Left post scalp ((MOHS), in situ- mid chest (Txpbx +exc), in situ-Left upper arm inferior (txpbx)  . Squamous cell carcinoma of skin 10/13/2015   Left upper arm-clear  . Squamous cell carcinoma of skin 03/09/2016   mod diff-mid chest (txpbx+ exc), mod diff-Right chest (txpbx+exc), well diff-right cheek-(txpbx),well diff-Left hand-(txpbx), in situ-Left upper arm (txpbx), well diff-Right cheek -(txpbx), well diff-Right crease arm (txpbx)   . Squamous cell carcinoma of skin  05/24/2016   well diff-Right nasal crease-(MOHS)  . Squamous cell carcinoma of skin 08/02/2016   KA-Left chest med (txpbx)  . Squamous cell carcinoma of skin 08/30/2016   in situ-Left outer zygoma (txpbx)  . Squamous cell carcinoma of skin 12/06/2016   well diff-Left chest sup, Left shoudler, insitu- right post scalp  . Squamous cell carcinoma of skin 02/14/2017   well diff-Left forearm (EXC),in situ-RIght ant neck  . Squamous cell carcinoma of skin 06/14/2017   in situ-Right forearm (txpbx), in situ-Right chest (txpbx), well diff-left chest (txpbx), well diff-anterior neck- (txpbx)  . Squamous cell carcinoma of skin 08/07/2017   well diff-Left upper shoulder (txpbx), sup and invasive-Left temple (txpbx), well diff-Right upper shin (txpbx), in situ-Right clavicle (txpbx)  . Squamous cell carcinoma of skin 10/17/2017   well diff-ant. neck (MOHS), in situ-Right chest, inf (txpbx)  . Squamous cell carcinoma of skin 01/21/2018   well diff- Right chest,ulnar (txpbx), well diff- right upper chest (txpbx), in situ-Right ant. crown (txpbx)  . Squamous cell carcinoma of skin 08/02/2020   well diff-left lower leg-inferior (Txpbx)  . Squamous cell carcinoma of skin 08/02/2020   well diff-right lower leg-mid (txpbx)  . Squamous cell carcinoma of skin 08/02/2020   well diff-left chest upper  . Squamous cell carcinoma of skin 08/02/2020   well diff-mid parietal scalp (MOHS)  . Squamous cell carcinoma of skin 08/02/2020   well diff-right foot inner(txpbx)  . Squamous cell carcinoma of skin 08/02/2020   well diff- left lower leg medial (txpbx)  . Squamous cell carcinoma of skin 08/02/2020   well diff-left lower leg anterior (txpbx)  . Squamous cell carcinoma of skin 08/02/2020   well diff-left lower leg medial (txpbx)  . Squamous cell carcinoma of skin 08/02/2020   well diff-right forearm-posterior (txpbx)   Past Surgical History:  Procedure Laterality Date  . ABDOMINAL HERNIA REPAIR      Patient's states that she has had 8- 9 hernia surgeries  . ABDOMINAL HYSTERECTOMY    . CATARACT EXTRACTION W/PHACO Right 01/15/2020   Procedure: CATARACT EXTRACTION PHACO AND INTRAOCULAR LENS PLACEMENT (IOC);  Surgeon: Baruch Goldmann, MD;  Location: [facility];  Service: Ophthalmology;  Laterality: Right;  CDE: 7.89  . CATARACT EXTRACTION W/PHACO Left 01/29/2020   Procedure: CATARACT EXTRACTION PHACO AND INTRAOCULAR LENS PLACEMENT (IOC) (CDE: 6.33);  Surgeon: Baruch Goldmann, MD;  Location: [facility];  Service: Ophthalmology;  Laterality: Left;  . CHOLECYSTECTOMY  2007  . COLON RESECTION    . COLON SURGERY  2008   Done at [facility]  . COLONOSCOPY  Done at [facility]  . ESOPHAGOGASTRODUODENOSCOPY N/A 08/21/2018   Procedure: ESOPHAGOGASTRODUODENOSCOPY (EGD);  Surgeon: Rogene Houston, MD;  Location: [facility];  Service: Endoscopy;  Laterality: N/A;  . ESOPHAGOGASTRODUODENOSCOPY (EGD) WITH PROPOFOL N/A 12/16/2019   Procedure: ESOPHAGOGASTRODUODENOSCOPY (EGD) WITH PROPOFOL;  Surgeon: Rogene Houston, MD;  Location: [facility];  Service: Endoscopy;  Laterality: N/A;  . EYE SURGERY     lasix  . FLEXIBLE SIGMOIDOSCOPY N/A 10/20/2015   Procedure: FLEXIBLE SIGMOIDOSCOPY;  Surgeon: Rogene Houston, MD;  Location: [facility];  Service: Endoscopy;  Laterality: N/A;  53 - Dr Laural Golden has meeting until 1:00  . FLEXIBLE SIGMOIDOSCOPY N/A 07/11/2016   Procedure: FLEXIBLE SIGMOIDOSCOPY;  Surgeon: Rogene Houston, MD;  [facility]: [facility];  Service: Endoscopy;  Laterality: N/A;  1200  . FLEXIBLE SIGMOIDOSCOPY N/A 08/09/2017   Procedure: FLEXIBLE SIGMOIDOSCOPY;  Surgeon: Rogene Houston, MD;  [facility]: [facility];  Service: Endoscopy;  Laterality: N/A;  1:00  . FLEXIBLE SIGMOIDOSCOPY N/A 08/21/2018   Procedure: FLEXIBLE SIGMOIDOSCOPY;  Surgeon: Rogene Houston, MD;  [facility]: [facility];  Service: Endoscopy;  Laterality: N/A;  . FLEXIBLE SIGMOIDOSCOPY N/A 12/16/2019   Procedure: FLEXIBLE SIGMOIDOSCOPY wirh  Propofol;  Surgeon: Rogene Houston, MD;  [facility]: [facility];  Service: Endoscopy;  Laterality: N/A;  7:30  . FRACTURE SURGERY     right wrist metal plate  . HERNIA REPAIR    . LIVER TRANSPLANT  [id number]  . multiple skin cancers removed    . POLYPECTOMY  08/09/2017   Procedure: POLYPECTOMY;  Surgeon: Rogene Houston, MD;  Location: [facility];  Service: Endoscopy;;  colon small bowel  . REVERSE SHOULDER ARTHROPLASTY Left 07/17/2018  . REVERSE SHOULDER ARTHROPLASTY Left 07/17/2018   Procedure: LEFT REVERSE SHOULDER ARTHROPLASTY;  Surgeon: Justice Britain, MD;  Location: [facility];  Service: Orthopedics;  Laterality: Left;  141mn  . SHOULDER CLOSED REDUCTION Left 09/27/2019   Procedure: CLOSED REDUCTION SHOULDER;  Surgeon: OParalee Cancel MD;  Location: [facility];  Service: Orthopedics;  Laterality: Left;  . SPLENECTOMY  2006  . TOTAL SHOULDER REVISION Left 11/12/2019   Procedure: Revision Left Reverse Shoulder Arthroplasty with poly exchange SDD;  Surgeon: SJustice Britain MD;  Location: [facility];  Service: Orthopedics;  Laterality: Left;  1223m -SDDC  . TYMPANOSTOMY TUBE PLACEMENT    . UPPER GASTROINTESTINAL ENDOSCOPY     Done at [facility]    Objective: Blood pressure 107/69, pulse 94, temperature 98.9 F (37.2 C), temperature source Oral, height 5' 4" (1.626 m), weight 143 lb 14.4 oz (65.3 kg). Patient is alert and in no acute distress. She has central alopecia which involves large area which she is hiding by using  cap. Conjunctiva is pink. Sclera is nonicteric Oropharyngeal mucosa is normal. No neck masses or thyromegaly noted. Cardiac exam with regular rhythm normal S1 and S2. No murmur or gallop noted. Lungs are clear to auscultation. Abdomen is symmetrical.  She has multiple scars.  Bowel sounds are normal.  On palpation abdomen is soft.  She has mild tenderness in left upper quadrant as well as epigastric region.  Left lobe of the liver is palpable.  It is firm and nontender.  No masses  noted. She has trace edema around ankles.  Labs/studies Results:  CBC Latest Ref Rng & Units 01/26/2021 10/18/2020 07/25/2020  WBC 4.0 - 10.5 K/uL 8.6 7.7 7.1  Hemoglobin 12.0 - 15.0 g/dL 10.5(L) 10.2(L) 10.8(L)  Hematocrit 36.0 - 46.0 % 35.3(L) 34.8(L)  35.7(L)  Platelets 150 - 400 K/uL 505(H) 395 339    CMP Latest Ref Rng & Units 01/26/2021 10/18/2020 07/25/2020  Glucose 70 - 99 mg/dL 108(H) 145(H) 110(H)  BUN 8 - 23 mg/dL _0 Creatinine 0.44 - 1.00 mg/dL 0.98 0.92 1.09(H)  Sodium 135 - 145 mmol/L 133(L) 137 136  Potassium 3.5 - 5.1 mmol/L 3.4(L) 4.3 4.2  Chloride 98 - 111 mmol/L 97(L) 102 101  CO2 22 - 32 mmol/L _1 Calcium 8.9 - 10.3 mg/dL 8.6(L) 8.3(L) 8.7(L)  Total Protein 6.5 - 8.1 g/dL 6.9 6.6 6.6  Total Bilirubin 0.3 - 1.2 mg/dL 0.3 0.6 0.5  Alkaline Phos 38 - 126 U/L 202(H) 96 81  AST 15 - 41 U/L _2 ALT 0 - 44 U/L _3 Hepatic Function Latest Ref Rng & Units 01/26/2021 10/18/2020 07/25/2020  Total Protein 6.5 - 8.1 g/dL 6.9 6.6 6.6  Albumin 3.5 - 5.0 g/dL 2.8(L) 3.1(L) 3.1(L)  AST 15 - 41 U/L _4 ALT 0 - 44 U/L _5 Alk Phosphatase 38 - 126 U/L 202(H) 96 81  Total Bilirubin 0.3 - 1.2 mg/dL 0.3 0.6 0.5  Bilirubin, Direct 0.00 - 0.40 mg/dL - - -    Other labs from 01/26/2021  Serum iron 10, TIBC 202 and saturation 5% Serum ferritin 72. B12 4571  Vitamin D2 86.75  Stool studies negative for Salmonella, Shigella, Campylobacter and C. difficile by PCR. Stool test negative for E. coli and Shiga toxin assay. Stool wet prep negative tried Crohn stain smear negative for ova and parasites.  Assessment:  #1.  Acute diarrhea.  Symptoms started 2 weeks ago when she also had fever and chills but unfortunately did not seek immediate medical attention.  She received few days of antibiotic for dental abscess 1 month ago but C. difficile stool testing negative as are other studies.  Infection still likely in spite of negative studies.  Also need to  worry about anastomotic stricture and spurious diarrhea or inflammatory bowel disease with acute presentation. Patient will be empirically treated with Cipro and metronidazole.  Records reflect allergy to Cipro causing itching.  This occurred years ago before her liver transplant.  Itching possibly unrelated to Cipro.  #2.  Elevated serum alkaline phosphatase.  Alkaline phosphatase has doubled since it was checked 3 months ago and was normal.  Transaminases are normal.  She has history of anastomotic biliary stricture following liver transplant which required stenting.  I hope that this is not the case.  #3.  History of live donor liver transplant for PBC.  Transaminases are normal.  She is under care of Dr. Laurier Nancy of [facility]  She remains on everolimus and Urso.    #4.  History of iron deficiency anemia.  She has responded to iron therapy in the past.  Hemoglobin has dropped again.  She is receiving periodic parenteral iron at oncology clinic.  #5.  History of colon carcinoma.  She only has rectum and short segment of sigmoid colon in situ.  Rest of the colon has been removed.  Last sigmoidoscopy was in February 2021.  She is due for examination now.  Plan:  Check fecal lactoferrin. Abdominal pelvic CT with contrast as soon as possible.  Cipro 250 mg by mouth twice daily for 1 week.  Metronidazole 250 mg by mouth after each meal or 3 times a day for 1 week. Patient advised to take  diphenoxylate 1 tablet before each meal until diarrhea improves and thereafter on as-needed basis.  Prescription given for 90 doses without refill. Patient will be scheduled for flexible sigmoidoscopy in near future.

## 2021-02-02 NOTE — Progress Notes (Signed)
Cassidy Barber, [place] [address]   DIAGNOSIS: Iron deficiency anemia  CURRENT THERAPY: Intermittent iron infusions  I connected with [name] on 02/02/21 at  1:00 PM EDT by telephone visit and verified that I am speaking with the correct person using two identifiers.   I discussed the limitations, risks, security and privacy concerns of performing an evaluation and management service by telemedicine and the availability of in-person appointments. I also discussed with the patient that there may be a patient responsible charge related to this service. The patient expressed understanding and agreed to proceed.   Other persons participating in the visit and their role in the encounter: None  Patient's location: Home Provider's location: Clinic    INTERVAL HISTORY: Cassidy Barber 70 y.o. female was called for a telephone visit for iron deficiency anemia.  She was last seen in clinic on 10/31/2020.  She last received IV Venofer on 12/09/2020 and 12/12/2018.  Reports diarrhea for the past two weeks. Noted to have a fever as well relieved with tylenol. Met with Dr. Laural Golden this morning regarding diarrhea and he has ordered some stool samples to check for infection and placed her on cipro and flagyl.   She has significant fatigue due to diarrhea.  She also admits to anorexia.  Reports very little physical activity and has been sleeping quite a bit secondary to exhaustion.  Having intermittent abdominal pain.  She completed radiation to her scalp in January 2022 and has had several other lesions pop up on her skin which have been treated as well.  She continues to be seen at [facility] for her history of a liver transplant.   Patient Active Problem List   Diagnosis Date Noted  . Elevated alkaline phosphatase level 02/02/2021  . S/P reverse total shoulder arthroplasty, left 11/12/2019  . Anterior dislocation of left shoulder 09/26/2019  . Hx of colonic polyps  09/04/2019  . Chest pain 09/04/2019  . Benzodiazepine dependence ([place]) 04/13/2019  . Controlled substance agreement signed 04/13/2019  . Urticaria 09/08/2018  . S/p reverse total shoulder arthroplasty 07/17/2018  . History of colon cancer 06/12/2018  . History of colonic polyps 06/12/2018  . 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  . Gastroesophageal reflux disease 06/12/2018  . Chronic diarrhea 06/26/2017  . Iron deficiency anemia 06/26/2017  . GAD (generalized anxiety disorder) 08/28/2016  . Vitamin D deficiency 08/28/2016  . Insomnia 08/28/2016  . Convulsions ([place]) 11/30/2015  . Liver transplant recipient [facility]) 11/30/2015  . Seizures ([place]) 09/27/2015  . Essential hypertension, benign 03/10/2015  . Hx of liver transplant ([place]) 12/08/2014  . Acute diarrhea 12/08/2014  . Colon cancer ([place]) 12/08/2014  . Hepatic encephalopathy ([place]) 10/26/2013  . Anemia of chronic disease 10/26/2013  . Personal history of colonic polyps 12/27/2012  . Abdominal pain 12/23/2012  . PBC (primary biliary cirrhosis) 03/24/2012  . Hypothyroidism 03/24/2012  . Ventral hernia, recurrent 03/24/2012    is allergic to ciprofloxacin and codeine.  MEDICAL HISTORY: Past Medical History:  Diagnosis Date  . Abdominal wall hernia    Incarcerated status post surgical repair 2019 - [facility]  . Anemia of chronic disease   . Atypical nevus 01/21/2018   atypical neoplasm- Left scalp-ant (txpbx + MOHS), atypical neoplasm- Left scalp post- (txpbx + MOHS)  . Basal cell carcinoma   . Colon cancer [facility])    Colon surgery 2005 and 2012  . History of pulmonary hypertension    Pre liver transplant  . History  of seizures   . Hypertension   . Hypothyroidism   . Lynch syndrome   . Osteopenia   . Primary biliary cirrhosis (HCC)    Status post liver transplantation - follows at [facility]  . SCCA (squamous cell carcinoma) of skin 02/23/2020   Right Upper Chest(moderate) (MOH's)  . SCCA (squamous cell carcinoma) of skin 04/07/2020   Left  Top Leg (Keratoacanthoma) treatment after biopsy  . SCCA (squamous cell carcinoma) of skin 04/07/2020   Left Foot Dorsal (in situ) treatment after biopsy  . Squamous cell carcinoma of skin 04/22/2018   KA-Right mid chest (txpbx), KA-left elbow crease (txpbx), insitu-Right mid chest inf. (exc)  . Squamous cell carcinoma of skin 05/20/2018   well diff-Left upper shin (txpbx), well diff-Right lower forearm (txpbx), well diff-Right upper shin (txpbx)  . Squamous cell carcinoma of skin 06/11/2018   Scc + margin-Right mid chest inferior   . Squamous cell carcinoma of skin 06/27/2018   well diff-Left mid thigh(txpbx), well diff-Left inner thigh (txpbx),insitu-Right cheek (txpbx),well diff-right inner heel (txpbx)  . Squamous cell carcinoma of skin 09/16/2018   well diff-left shoulder (txpbx), well diff-Right chin (txpbx), well diff-right chest lateral (CX35FU)  . Squamous cell carcinoma of skin 10/01/2018   Right outer lower shin (Txpbx)  . Squamous cell carcinoma of skin 04/03/2019   well diff-Right center chest (MOHS), in situ-Right ear  . Squamous cell carcinoma of skin 08/05/2019   in situ-Left calf (txpbx), in situ-left bicep (txpbx), well diff-Left chest,inf(txpbx), in situ-Right chest inf-(txpbx)  . Squamous cell carcinoma of skin 11/19/2019   KA-left top leg (txpbx), modify-Riight forehead-(mohs), in situ-right hand (txpbx), in situ-Right forearm (txpbx), well diff-Right chest (txpbx), well diff-chin (txpbx)  . Squamous cell carcinoma of skin 01/06/2020   KA- Left top leg  . Squamous cell carcinoma of skin 05/12/2013   bowens-middle of chest (CX35FU)  . Squamous cell carcinoma of skin 05/18/2015   well diff-Left upper arm (CX35FU + Exc),KA-right chest(txpbx), in situ-Left shin (txpbx), well diff-Right cheek (CX35FU), KA-Left post scalp (CX35FU)  . Squamous cell carcinoma of skin 08/09/2015   KA-Left post scalp ((MOHS), in situ- mid chest (Txpbx +exc), in situ-Left upper arm inferior  (txpbx)  . Squamous cell carcinoma of skin 10/13/2015   Left upper arm-clear  . Squamous cell carcinoma of skin 03/09/2016   mod diff-mid chest (txpbx+ exc), mod diff-Right chest (txpbx+exc), well diff-right cheek-(txpbx),well diff-Left hand-(txpbx), in situ-Left upper arm (txpbx), well diff-Right cheek -(txpbx), well diff-Right crease arm (txpbx)   . Squamous cell carcinoma of skin 05/24/2016   well diff-Right nasal crease-(MOHS)  . Squamous cell carcinoma of skin 08/02/2016   KA-Left chest med (txpbx)  . Squamous cell carcinoma of skin 08/30/2016   in situ-Left outer zygoma (txpbx)  . Squamous cell carcinoma of skin 12/06/2016   well diff-Left chest sup, Left shoudler, insitu- right post scalp  . Squamous cell carcinoma of skin 02/14/2017   well diff-Left forearm (EXC),in situ-RIght ant neck  . Squamous cell carcinoma of skin 06/14/2017   in situ-Right forearm (txpbx), in situ-Right chest (txpbx), well diff-left chest (txpbx), well diff-anterior neck- (txpbx)  . Squamous cell carcinoma of skin 08/07/2017   well diff-Left upper shoulder (txpbx), sup and invasive-Left temple (txpbx), well diff-Right upper shin (txpbx), in situ-Right clavicle (txpbx)  . Squamous cell carcinoma of skin 10/17/2017   well diff-ant. neck (MOHS), in situ-Right chest, inf (txpbx)  . Squamous cell carcinoma of skin 01/21/2018   well diff- Right chest,ulnar (txpbx), well diff-  right upper chest (txpbx), in situ-Right ant. crown (txpbx)  . Squamous cell carcinoma of skin 08/02/2020   well diff-left lower leg-inferior (Txpbx)  . Squamous cell carcinoma of skin 08/02/2020   well diff-right lower leg-mid (txpbx)  . Squamous cell carcinoma of skin 08/02/2020   well diff-left chest upper  . Squamous cell carcinoma of skin 08/02/2020   well diff-mid parietal scalp (MOHS)  . Squamous cell carcinoma of skin 08/02/2020   well diff-right foot inner(txpbx)  . Squamous cell carcinoma of skin 08/02/2020   well diff- left  lower leg medial (txpbx)  . Squamous cell carcinoma of skin 08/02/2020   well diff-left lower leg anterior (txpbx)  . Squamous cell carcinoma of skin 08/02/2020   well diff-left lower leg medial (txpbx)  . Squamous cell carcinoma of skin 08/02/2020   well diff-right forearm-posterior (txpbx)    SURGICAL HISTORY: Past Surgical History:  Procedure Laterality Date  . ABDOMINAL HERNIA REPAIR     Patient's states that she has had 8- 9 hernia surgeries  . ABDOMINAL HYSTERECTOMY    . CATARACT EXTRACTION W/PHACO Right 01/15/2020   Procedure: CATARACT EXTRACTION PHACO AND INTRAOCULAR LENS PLACEMENT (IOC);  Surgeon: Baruch Goldmann, MD;  Location: [facility];  Service: Ophthalmology;  Laterality: Right;  CDE: 7.89  . CATARACT EXTRACTION W/PHACO Left 01/29/2020   Procedure: CATARACT EXTRACTION PHACO AND INTRAOCULAR LENS PLACEMENT (IOC) (CDE: 6.33);  Surgeon: Baruch Goldmann, MD;  Location: [facility];  Service: Ophthalmology;  Laterality: Left;  . CHOLECYSTECTOMY  2007  . COLON RESECTION    . COLON SURGERY  2008   [facility]  . COLONOSCOPY     Done at [facility]  . ESOPHAGOGASTRODUODENOSCOPY N/A 08/21/2018   Procedure: ESOPHAGOGASTRODUODENOSCOPY (EGD);  Surgeon: Rogene Houston, MD;  Location: [facility];  Service: Endoscopy;  Laterality: N/A;  . ESOPHAGOGASTRODUODENOSCOPY (EGD) WITH PROPOFOL N/A 12/16/2019   Procedure: ESOPHAGOGASTRODUODENOSCOPY (EGD) WITH PROPOFOL;  Surgeon: Rogene Houston, MD;  Location: [facility];  Service: Endoscopy;  Laterality: N/A;  . EYE SURGERY     lasix  . FLEXIBLE SIGMOIDOSCOPY N/A 10/20/2015   Procedure: FLEXIBLE SIGMOIDOSCOPY;  Surgeon: Rogene Houston, MD;  Location: [facility];  Service: Endoscopy;  Laterality: N/A;  88 - Dr Laural Golden has meeting until 1:00  . FLEXIBLE SIGMOIDOSCOPY N/A 07/11/2016   Procedure: FLEXIBLE SIGMOIDOSCOPY;  Surgeon: Rogene Houston, MD;  Location: [facility];  Service: Endoscopy;  Laterality: N/A;  1200  . FLEXIBLE SIGMOIDOSCOPY N/A  08/09/2017   Procedure: FLEXIBLE SIGMOIDOSCOPY;  Surgeon: Rogene Houston, MD;  [facility];  Service: Endoscopy;  Laterality: N/A;  1:00  . FLEXIBLE SIGMOIDOSCOPY N/A 08/21/2018   Procedure: FLEXIBLE SIGMOIDOSCOPY;  Surgeon: Rogene Houston, MD;  Location: [facility];  Service: Endoscopy;  Laterality: N/A;  . FLEXIBLE SIGMOIDOSCOPY N/A 12/16/2019   Procedure: FLEXIBLE SIGMOIDOSCOPY wirh Propofol;  Surgeon: Rogene Houston, MD;  Location: [facility];  Service: Endoscopy;  Laterality: N/A;  7:30  . FRACTURE SURGERY     right wrist metal plate  . HERNIA REPAIR    . LIVER TRANSPLANT  [id number]  . multiple skin cancers removed    . POLYPECTOMY  08/09/2017   Procedure: POLYPECTOMY;  Surgeon: Rogene Houston, MD;  Location: [facility];  Service: Endoscopy;;  colon small bowel  . REVERSE SHOULDER ARTHROPLASTY Left 07/17/2018  . REVERSE SHOULDER ARTHROPLASTY Left 07/17/2018   Procedure: LEFT REVERSE SHOULDER ARTHROPLASTY;  Surgeon: Justice Britain, MD;  Location[facility]  OR;  Service: Orthopedics;  Laterality: Left;  152mn  . SHOULDER CLOSED REDUCTION Left 09/27/2019   Procedure: CLOSED REDUCTION SHOULDER;  Surgeon: OParalee Cancel MD;  Location: [facility];  Service: Orthopedics;  Laterality: Left;  . SPLENECTOMY  2006  . TOTAL SHOULDER REVISION Left 11/12/2019   Procedure: Revision Left Reverse Shoulder Arthroplasty with poly exchange SDD;  Surgeon: SJustice Britain MD;  Location: [facility];  Service: Orthopedics;  Laterality: Left;  1275m -SDDC  . TYMPANOSTOMY TUBE PLACEMENT    . UPPER GASTROINTESTINAL ENDOSCOPY     [facility]  SOCIAL HISTORY: Social History   Socioeconomic History  . Marital status: Married    Spouse name: JoCharlotte Crumb. Number of children: 1  . Years of education: 1268. Highest education level: High school graduate  Occupational History  . Occupation: Disability    Employer: [employer]    Comment: AdMultimedia programmerTobacco Use  . Smoking status: Never  Smoker  . Smokeless tobacco: Never Used  Vaping Use  . Vaping Use: Never used  Substance and Sexual Activity  . Alcohol use: No    Alcohol/week: 0.0 standard drinks  . Drug use: No  . Sexual activity: Yes    Birth control/protection: None  Other Topics Concern  . Not on file  Social History Narrative   Patient lives in a two story home with her husband. She has an adult son. 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    Social Determinants of Health   Financial Resource Strain: Low Risk   . Difficulty of Paying Living Expenses: Not hard at all  Food Insecurity: No Food Insecurity  . Worried About RuCharity fundraisern the Last Year: Never true  . Ran Out of Food in the Last Year: Never true  Transportation Needs: No Transportation Needs  . Lack of Transportation (Medical): No  . Lack of Transportation (Non-Medical): No  Physical Activity: Sufficiently Active  . Days of Exercise per Week: 7 days  . Minutes of Exercise per Session: 30 min  Stress: No Stress Concern Present  . Feeling of Stress : Not at all  Social Connections: Socially Integrated  . Frequency of Communication with Friends and Family: More than three times a week  . Frequency of Social Gatherings with Friends and Family: More than three times a week  . Attends Religious Services: More than 4 times per year  . Active Member of Clubs or Organizations: Yes  . Attends ClArchivisteetings: More than 4 times per year  . Marital Status: Married  InHuman resources officeriolence: Not At Risk  . Fear of Current or Ex-Partner: No  . Emotionally Abused: No  . Physically Abused: No  . Sexually Abused: No    FAMILY HISTORY: Family History  Problem Relation Age of Onset  . Prostate cancer Father   . Colon cancer Father   . Colon cancer Sister   . Lung cancer Sister   . Healthy Son   . Alcohol abuse Brother   . Allergic rhinitis Neg Hx   . Asthma Neg Hx   . Eczema Neg Hx   .  Urticaria Neg Hx     Review of Systems  Review of Systems  Constitutional: Positive for malaise/fatigue.  Gastrointestinal: Positive for diarrhea.     Vital signs: -Deferred due to telephone visit  Physical Exam -Deferred due to telephone visit -Patient was alert and oriented over the phone in  no acute distress.   LABORATORY DATA:  CBC    Component Value Date/Time   WBC 8.6 01/26/2021 1316   RBC 3.94 01/26/2021 1316   HGB 10.5 (L) 01/26/2021 1316   HGB 12.0 06/28/2020 1146   HCT 35.3 (L) 01/26/2021 1316   HCT 36.8 06/28/2020 1146   PLT 505 (H) 01/26/2021 1316   PLT 297 06/28/2020 1146   MCV 89.6 01/26/2021 1316   MCV 86 06/28/2020 1146   MCH 26.6 01/26/2021 1316   MCHC 29.7 (L) 01/26/2021 1316   RDW 18.9 (H) 01/26/2021 1316   RDW 12.3 06/28/2020 1146   LYMPHSABS 1.4 01/26/2021 1316   LYMPHSABS 1.9 06/28/2020 1146   MONOABS 1.0 01/26/2021 1316   EOSABS 0.4 01/26/2021 1316   EOSABS 0.7 (H) 06/28/2020 1146   BASOSABS 0.1 01/26/2021 1316   BASOSABS 0.2 06/28/2020 1146    CMP     Component Value Date/Time   NA 133 (L) 01/26/2021 1316   NA 138 06/28/2020 1146   K 3.4 (L) 01/26/2021 1316   CL 97 (L) 01/26/2021 1316   CO2 27 01/26/2021 1316   GLUCOSE 108 (H) 01/26/2021 1316   BUN 15 01/26/2021 1316   BUN 13 06/28/2020 1146   CREATININE 0.98 01/26/2021 1316   CREATININE 0.62 12/23/2012 1140   CALCIUM 8.6 (L) 01/26/2021 1316   PROT 6.9 01/26/2021 1316   PROT 6.6 06/28/2020 1146   ALBUMIN 2.8 (L) 01/26/2021 1316   ALBUMIN 3.9 06/28/2020 1146   AST 20 01/26/2021 1316   ALT 14 01/26/2021 1316   ALKPHOS 202 (H) 01/26/2021 1316   BILITOT 0.3 01/26/2021 1316   BILITOT <0.2 06/28/2020 1146   GFRNONAA >60 01/26/2021 1316   GFRAA 60 (L) 07/25/2020 1035    All questions were answered to patient's stated satisfaction. Encouraged patient to call with any new concerns or questions before his next visit to the cancer center and we can certain see him sooner, if needed.      ASSESSMENT and THERAPY PLAN:   1)Iron deficiency anemia: -Unable to tolerate oral iron. -Secondary to history of colon cancer s/p abdominal colectomy.  Malabsorption. -She had a sigmoidoscopy/EGD on 12/16/2019 which appeared normal.  Gastric antral vascular ectasia without bleeding.  Healed esophagitis.  -She last received IV Venofer on 12/09/2020 and 12/12/2020. -Labs from 01/26/2021 show a iron saturation of 5%, ferritin of 72, hemoglobin 10.5. -Recommend 3-5 doses of IV Venofer.   2. Liver Transplant: -Had this [facility] at [facility] in [facility] 2015.  -She is on immunosuppression with everolimus.   3.  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 -She is followed by dermatology and has had several areas treated recently with topical creams.   4.  History of colon cancer: -Status post abdominal colectomy on 09/17/2011 with simultaneous total abdominal hysterectomy and bilateral salpingo-oophorectomy. -Found to have Lynch syndrome. -Per recommendations she is to have an EGD every 2 to 3 years and annual colonoscopy for review of her residual colon tissue at the anastomosis.    4.  History of incarcerated ventral hernia: -Had abdominal wall hernia repair by Dr. Cain Saupe on 03/2018 and due to incarcerated hernia.  5. Diarrhea x2 weeks: -Being followed by Dr. Laural Golden. -He has ordered stool studies and imaging. -She was put on Cipro and Flagyl as of today.   Disposition: -Recommend 3-5 doses of 300 mg IV Venofer. -RTC in 3 months for  repeat labs and MD assessment.  No problem-specific Assessment & Plan notes found for this encounter.   No orders of the defined types were placed in this encounter.   All questions were answered. The patient knows to call the clinic with any problems, questions or concerns. We can certainly see the patient much sooner if  necessary. This note was electronically signed.  I provided 20 minutes of non face-to-face telephone visit time during this encounter, and > 50% was spent counseling as documented under my assessment & plan.  Jacquelin Hawking, NP 02/02/2021

## 2021-02-03 ENCOUNTER — Ambulatory Visit (HOSPITAL_COMMUNITY)
Admission: RE | Admit: 2021-02-03 | Discharge: 2021-02-03 | Disposition: A | Discharge status: Home / Self Care | Payer: Medicare Other | Source: Ambulatory Visit | Attending: Internal Medicine | Admitting: Internal Medicine

## 2021-02-03 DIAGNOSIS — R109 Unspecified abdominal pain: Secondary | ICD-10-CM | POA: Diagnosis not present

## 2021-02-03 DIAGNOSIS — R748 Abnormal levels of other serum enzymes: Secondary | ICD-10-CM | POA: Diagnosis not present

## 2021-02-03 DIAGNOSIS — R197 Diarrhea, unspecified: Secondary | ICD-10-CM | POA: Diagnosis not present

## 2021-02-03 MED ORDER — IOHEXOL 300 MG/ML  SOLN
100.0000 mL | Freq: Once | INTRAMUSCULAR | Status: AC | PRN
  Administered 2021-02-03: 75 mL via INTRAVENOUS

## 2021-02-07 ENCOUNTER — Telehealth (INDEPENDENT_AMBULATORY_CARE_PROVIDER_SITE_OTHER): Payer: Self-pay | Admitting: Internal Medicine

## 2021-02-07 DIAGNOSIS — C44329 Squamous cell carcinoma of skin of other parts of face: Secondary | ICD-10-CM | POA: Diagnosis not present

## 2021-02-07 LAB — FECAL LACTOFERRIN, QUANT
Fecal Lactoferrin: POSITIVE — AB
MICRO NUMBER:: 11696343
SPECIMEN QUALITY:: ADEQUATE

## 2021-02-07 NOTE — Telephone Encounter (Signed)
Patient called left message wanting to know the results from her stool sample - please advise - ph# [phone]

## 2021-02-08 ENCOUNTER — Telehealth (INDEPENDENT_AMBULATORY_CARE_PROVIDER_SITE_OTHER): Payer: Self-pay | Admitting: *Deleted

## 2021-02-08 ENCOUNTER — Other Ambulatory Visit (INDEPENDENT_AMBULATORY_CARE_PROVIDER_SITE_OTHER): Payer: Self-pay | Admitting: *Deleted

## 2021-02-08 NOTE — Telephone Encounter (Signed)
Referring MD/PCP:christy hawks   Procedure:flex sig w propofol  Reason/Indication:Hx colon ca, hx polyps  Has patient had this procedure before?Yes, 12/2019 If so, when, by whom and where?   Is there a family history of colon cancer?no Who? What age when diagnosed?   Is patient diabetic?no  Does patient have prosthetic heart valve or mechanical valve?no  Do you have a pacemaker/defibrillator?no  Has patient ever had endocarditis/atrial fibrillation?no  Does patient use oxygen?no  Has patient had joint replacement within last 12 months?no  Is patient constipated or do they take laxatives?no  Does patient have a history of alcohol/drug use?no  Is patient on blood thinner such as Coumadin, Plavix and/or Aspirin?yes  Medications:asa 81 mg daily, alendronate 70 mg once a week, alprazolam 0.5 mg prn, biotin daily, calcium daily, zyrtec 40 mg daily, famotidine 20 mg bid, hydroxyzine 25 mg prn, levothyroxine 125 mcg daily, imodium prn, losartan 25 mg daily, magnesium daily, metoprolol 25 mg daily, singulair daily, pantoprazole 40 mg daily, ursodiol 250 mg, vit b12 daily, vit d 50,000 units once a week, vit d3 75 mcg daily  Allergies:Cipro, codeine  Medication Adjustment per Dr Dellie Catholic 2 days  Procedure date & time: 02/15/21

## 2021-02-08 NOTE — Telephone Encounter (Signed)
Dr. Laural Golden will be made aware that the patient has called about her lab results. Once he has reviewed this he will call the patient or he will have me too.

## 2021-02-09 ENCOUNTER — Encounter (INDEPENDENT_AMBULATORY_CARE_PROVIDER_SITE_OTHER): Payer: Self-pay | Admitting: *Deleted

## 2021-02-09 ENCOUNTER — Other Ambulatory Visit (INDEPENDENT_AMBULATORY_CARE_PROVIDER_SITE_OTHER): Payer: Self-pay

## 2021-02-09 ENCOUNTER — Telehealth (INDEPENDENT_AMBULATORY_CARE_PROVIDER_SITE_OTHER): Payer: Self-pay | Admitting: *Deleted

## 2021-02-09 DIAGNOSIS — Z1509 Genetic susceptibility to other malignant neoplasm: Secondary | ICD-10-CM

## 2021-02-09 DIAGNOSIS — Z86010 Personal history of colonic polyps: Secondary | ICD-10-CM

## 2021-02-09 DIAGNOSIS — Z85038 Personal history of other malignant neoplasm of large intestine: Secondary | ICD-10-CM

## 2021-02-09 MED ORDER — SUPREP BOWEL PREP KIT 17.5-3.13-1.6 GM/177ML PO SOLN: 1.0000 | Freq: Once | ORAL | 0 refills | Status: AC

## 2021-02-09 NOTE — Telephone Encounter (Signed)
LeighAnn Zorana Brockwell, CMA  

## 2021-02-10 ENCOUNTER — Inpatient Hospital Stay (HOSPITAL_COMMUNITY): Payer: Medicare Other | Attending: Hematology and Oncology

## 2021-02-10 ENCOUNTER — Encounter (HOSPITAL_COMMUNITY): Payer: Self-pay

## 2021-02-10 ENCOUNTER — Other Ambulatory Visit: Payer: Self-pay

## 2021-02-10 VITALS — BP 95/57 | HR 64 | Temp 97.3°F | Resp 18

## 2021-02-10 DIAGNOSIS — D508 Other iron deficiency anemias: Secondary | ICD-10-CM

## 2021-02-10 DIAGNOSIS — D509 Iron deficiency anemia, unspecified: Secondary | ICD-10-CM | POA: Insufficient documentation

## 2021-02-10 MED ORDER — SODIUM CHLORIDE 0.9 % IV SOLN
200.0000 mg | Freq: Once | INTRAVENOUS | Status: AC
  Administered 2021-02-10: 200 mg via INTRAVENOUS
  Filled 2021-02-10: qty 200

## 2021-02-10 MED ORDER — SODIUM CHLORIDE 0.9 % IV SOLN: Freq: Once | INTRAVENOUS | Status: AC

## 2021-02-10 NOTE — Progress Notes (Signed)
Tolerated entirety of infusion w/o adverse reaction.  Alert, in no distress. VSS.  Discharged ambulatory in stable condition.

## 2021-02-10 NOTE — Patient Instructions (Signed)
Venofer infusion today. Return as scheduled for Venofer infusions.

## 2021-02-10 NOTE — Patient Instructions (Signed)
[facility] WELCOME  02/10/2021     @PREFPERIOPPHARMACY @   Your procedure is scheduled on  02/15/2021.   Report to [name] at  0730  A.M.   Call this number if you have problems the morning of surgery:  [phone]   Remember:  Follow the diet and prep instructions given to you by the office.                        Take these medicines the morning of surgery with A SIP OF WATER  Zyrtec, pepecid, gabapentin, levothyroxine, metoprolol, protonix, actigall, zortress.     Please brush your teeth.  Do not wear jewelry, make-up or nail polish.  Do not wear lotions, powders, or perfumes, or deodorant.  Do not shave 48 hours prior to surgery.  Men may shave face and neck.  Do not bring valuables to the hospital.  [facility] is not responsible for any belongings or valuables.   Contacts, dentures or bridgework may not be worn into surgery.  Leave your suitcase in the car.  After surgery it may be brought to your room.  For patients admitted to the hospital, discharge time will be determined by your treatment team.  Patients discharged the day of surgery will not be allowed to drive home and must have someone with them for 24 hours.   Special instructions:  DO NOT smoke tobacco or vape the morning of your procedure.   Please read over the following fact sheets that you were given. Anesthesia Post-op Instructions and Care and Recovery After Surgery       Flexible Sigmoidoscopy, Care After This sheet gives you information about how to care for yourself after your procedure. Your health care provider may also give you more specific instructions. If you have problems or questions, contact your health care provider. What can I expect after the procedure? After the procedure, it is common to have:  Cramping or pain in your abdomen.  Bloating.  A small amount of blood with your bowel movements. This may happen if a sample of tissue was removed for testing (biopsy). Follow  these instructions at home: Eating and drinking  Drink enough fluid to keep your urine pale yellow.  Follow instructions from your health care provider about eating or drinking restrictions.  Resume your normal diet as instructed by your health care provider. Avoid heavy or fried foods that are hard to digest.   Activity  If you were given a medicine to help you relax (sedative) during the procedure, it can affect you for several hours. Do not drive or operate machinery until your health care provider says that it is safe.  Rest as told by your health care provider.  Return to your normal activities as told by your health care provider. Ask your health care provider what activities are safe for you.   General instructions  Take over-the-counter and prescription medicines only as told by your health care provider.  Try walking around when you have cramps or feel bloated.  Keep all follow-up visits as told by your health care provider. This is important. Contact a health care provider if:  You have pain or cramping in your abdomen that gets worse or is not helped with medicine.  You have a small amount of bleeding from your rectum that continues after 24 hours.  You have nausea or vomiting.  You feel weak or dizzy.  You develop a  fever. Get help right away if:  You pass large blood clots or see a large amount of blood in the toilet after having a bowel movement.  You have severe pain in your abdomen.  You have nausea or vomiting for more than 24 hours after the procedure. Summary  After the procedure, you may have cramping or pain in your abdomen or you may have bloating. If you had a sample of tissue removed (biopsy), you may have a small amount of blood with your bowel movements.  Resume your normal diet as instructed by your health care provider. Avoid heavy or fried foods that are hard to digest.  Try walking around when you have cramps or feel bloated.  Get help  right away if you pass large blood clots or see a large amount of blood in the toilet after having a bowel movement. This information is not intended to replace advice given to you by your health care provider. Make sure you discuss any questions you have with your health care provider. Document Revised: 10/26/2019 Document Reviewed: 10/26/2019 Elsevier Patient Education  2021 [place] After This sheet gives you information about how to care for yourself after your procedure. Your health care provider may also give you more specific instructions. If you have problems or questions, contact your health care provider. What can I expect after the procedure? After the procedure, it is common to have:  Tiredness.  Forgetfulness about what happened after the procedure.  Impaired judgment for important decisions.  Nausea or vomiting.  Some difficulty with balance. Follow these instructions at home: For the time period you were told by your health care provider:  Rest as needed.  Do not participate in activities where you could fall or become injured.  Do not drive or use machinery.  Do not drink alcohol.  Do not take sleeping pills or medicines that cause drowsiness.  Do not make important decisions or sign legal documents.  Do not take care of children on your own.      Eating and drinking  Follow the diet that is recommended by your health care provider.  Drink enough fluid to keep your urine pale yellow.  If you vomit: ? Drink water, juice, or soup when you can drink without vomiting. ? Make sure you have little or no nausea before eating solid foods. General instructions  Have a responsible adult stay with you for the time you are told. It is important to have someone help care for you until you are awake and alert.  Take over-the-counter and prescription medicines only as told by your health care provider.  If you have sleep apnea,  surgery and certain medicines can increase your risk for breathing problems. Follow instructions from your health care provider about wearing your sleep device: ? Anytime you are sleeping, including during daytime naps. ? While taking prescription pain medicines, sleeping medicines, or medicines that make you drowsy.  Avoid smoking.  Keep all follow-up visits as told by your health care provider. This is important. Contact a health care provider if:  You keep feeling nauseous or you keep vomiting.  You feel light-headed.  You are still sleepy or having trouble with balance after 24 hours.  You develop a rash.  You have a fever.  You have redness or swelling around the IV site. Get help right away if:  You have trouble breathing.  You have new-onset confusion at home. Summary  For several hours after  your procedure, you may feel tired. You may also be forgetful and have poor judgment.  Have a responsible adult stay with you for the time you are told. It is important to have someone help care for you until you are awake and alert.  Rest as told. Do not drive or operate machinery. Do not drink alcohol or take sleeping pills.  Get help right away if you have trouble breathing, or if you suddenly become confused. This information is not intended to replace advice given to you by your health care provider. Make sure you discuss any questions you have with your health care provider. Document Revised: 07/14/2020 Document Reviewed: 10/01/2019 Elsevier Patient Education  2021 [employer].

## 2021-02-13 ENCOUNTER — Inpatient Hospital Stay (HOSPITAL_COMMUNITY): Payer: Medicare Other | Attending: Hematology

## 2021-02-13 VITALS — BP 123/73 | HR 73 | Temp 97.3°F | Resp 18

## 2021-02-13 DIAGNOSIS — D509 Iron deficiency anemia, unspecified: Secondary | ICD-10-CM | POA: Insufficient documentation

## 2021-02-13 DIAGNOSIS — D508 Other iron deficiency anemias: Secondary | ICD-10-CM

## 2021-02-13 MED ORDER — SODIUM CHLORIDE 0.9 % IV SOLN
200.0000 mg | Freq: Once | INTRAVENOUS | Status: AC
  Administered 2021-02-13: 200 mg via INTRAVENOUS
  Filled 2021-02-13: qty 200

## 2021-02-13 MED ORDER — SODIUM CHLORIDE 0.9 % IV SOLN: Freq: Once | INTRAVENOUS | Status: AC

## 2021-02-13 NOTE — Patient Instructions (Signed)
[facility]  Discharge Instructions:  Iron Sucrose injection What is this medicine? IRON SUCROSE (AHY ern SOO krohs) is an iron complex. Iron is used to make healthy red blood cells, which carry oxygen and nutrients throughout the body. This medicine is used to treat iron deficiency anemia in people with chronic kidney disease. This medicine may be used for other purposes; ask your health care provider or pharmacist if you have questions. COMMON BRAND NAME(S): Venofer What should I tell my health care provider before I take this medicine? They need to know if you have any of these conditions:  anemia not caused by low iron levels  heart disease  high levels of iron in the blood  kidney disease  liver disease  an unusual or allergic reaction to iron, other medicines, foods, dyes, or preservatives  pregnant or trying to get pregnant  breast-feeding How should I use this medicine? This medicine is for infusion into a vein. It is given by a health care professional in a hospital or clinic setting. Talk to your pediatrician regarding the use of this medicine in children. While this drug may be prescribed for children as young as 2 years for selected conditions, precautions do apply. Overdosage: If you think you have taken too much of this medicine contact a poison control center or emergency room at once. NOTE: This medicine is only for you. Do not share this medicine with others. What if I miss a dose? It is important not to miss your dose. Call your doctor or health care professional if you are unable to keep an appointment. What may interact with this medicine? Do not take this medicine with any of the following medications:  deferoxamine  dimercaprol  other iron products This medicine may also interact with the following medications:  chloramphenicol  deferasirox This list may not describe all possible interactions. Give your health  care provider a list of all the medicines, herbs, non-prescription drugs, or dietary supplements you use. Also tell them if you smoke, drink alcohol, or use illegal drugs. Some items may interact with your medicine. What should I watch for while using this medicine? Visit your doctor or healthcare professional regularly. Tell your doctor or healthcare professional if your symptoms do not start to get better or if they get worse. You may need blood work done while you are taking this medicine. You may need to follow a special diet. Talk to your doctor. Foods that contain iron include: whole grains/cereals, dried fruits, beans, or peas, leafy green vegetables, and organ meats (liver, kidney). What side effects may I notice from receiving this medicine? Side effects that you should report to your doctor or health care professional as soon as possible:  allergic reactions like skin rash, itching or hives, swelling of the face, lips, or tongue  breathing problems  changes in blood pressure  cough  fast, irregular heartbeat  feeling faint or lightheaded, falls  fever or chills  flushing, sweating, or hot feelings  joint or muscle aches/pains  seizures  swelling of the ankles or feet  unusually weak or tired Side effects that usually do not require medical attention (report to your doctor or health care professional if they continue or are bothersome):  diarrhea  feeling achy  headache  irritation at site where injected  nausea, vomiting  stomach upset  tiredness This list may not describe all possible side effects. Call your doctor for medical advice about side effects. You   may report side effects to FDA at 1-800-FDA-1088. Where should I keep my medicine? This drug is given in a hospital or clinic and will not be stored at home. NOTE: This sheet is a summary. It may not cover all possible information. If you have questions about this medicine, talk to your doctor,  pharmacist, or health care provider.  2021 Elsevier/Gold Standard (2011-08-09 17:14:35)  _______________________________________________________________  Thank you for choosing [facility] at [facility] to provide your oncology and hematology care.  To afford each patient quality time with our providers, please arrive at least 15 minutes before your scheduled appointment.  You need to re-schedule your appointment if you arrive 10 or more minutes late.  We strive to give you quality time with our providers, and arriving late affects you and other patients whose appointments are after yours.  Also, if you no show three or more times for appointments you may be dismissed from the clinic.  Again, thank you for choosing [facility] at [facility]. Our hope is that these requests will allow you access to exceptional care and in a timely manner. _______________________________________________________________  If you have questions after your visit, please contact our office at [phone] between the hours of 8:30 a.m. and 5:00 p.m. Voicemails left after 4:30 p.m. will not be returned until the following business day. _______________________________________________________________  For prescription refill requests, have your pharmacy contact our office. _______________________________________________________________  Recommendations made by the consultant and any test results will be sent to your referring physician. _______________________________________________________________ 

## 2021-02-13 NOTE — Progress Notes (Signed)
Cassidy Barber presents today for IV Venofer infusion per MDs orders in supportive therapy plan. Vitals have been reviewed and are stable and within parameters for infusion today. Infusion given without incident or complaint. Patient observed for 30 minutes post infusion per orders. VSS upon completion of infusion, IV flushed and removed per protocol, see MAR and IV flowsheet for details. Discharged in satisfactory condition with follow up instructions.

## 2021-02-14 ENCOUNTER — Other Ambulatory Visit: Payer: Self-pay

## 2021-02-14 ENCOUNTER — Encounter (HOSPITAL_COMMUNITY)
Admission: RE | Admit: 2021-02-14 | Discharge: 2021-02-14 | Disposition: A | Discharge status: Home / Self Care | Payer: Medicare Other | Source: Ambulatory Visit | Attending: Internal Medicine | Admitting: Internal Medicine

## 2021-02-14 ENCOUNTER — Encounter (HOSPITAL_COMMUNITY): Payer: Self-pay

## 2021-02-14 ENCOUNTER — Other Ambulatory Visit (HOSPITAL_COMMUNITY)
Admission: RE | Admit: 2021-02-14 | Discharge: 2021-02-14 | Disposition: A | Discharge status: Home / Self Care | Payer: Medicare Other | Source: Ambulatory Visit | Attending: Internal Medicine | Admitting: Internal Medicine

## 2021-02-14 DIAGNOSIS — Z20822 Contact with and (suspected) exposure to covid-19: Secondary | ICD-10-CM | POA: Diagnosis not present

## 2021-02-14 DIAGNOSIS — Z85038 Personal history of other malignant neoplasm of large intestine: Secondary | ICD-10-CM

## 2021-02-14 DIAGNOSIS — Z1509 Genetic susceptibility to other malignant neoplasm: Secondary | ICD-10-CM

## 2021-02-14 DIAGNOSIS — Z86010 Personal history of colonic polyps: Secondary | ICD-10-CM

## 2021-02-14 DIAGNOSIS — Z01818 Encounter for other preprocedural examination: Secondary | ICD-10-CM | POA: Insufficient documentation

## 2021-02-15 ENCOUNTER — Ambulatory Visit (HOSPITAL_COMMUNITY): Payer: Medicare Other

## 2021-02-15 ENCOUNTER — Ambulatory Visit (HOSPITAL_COMMUNITY)
Admission: RE | Admit: 2021-02-15 | Discharge: 2021-02-15 | Disposition: A | Discharge status: Home / Self Care | Payer: Medicare Other | Attending: Internal Medicine | Admitting: Internal Medicine

## 2021-02-15 ENCOUNTER — Encounter (HOSPITAL_COMMUNITY)
Admission: RE | Disposition: A | Discharge status: Home / Self Care | Payer: Self-pay | Source: Home / Self Care | Attending: Internal Medicine

## 2021-02-15 ENCOUNTER — Ambulatory Visit (HOSPITAL_COMMUNITY): Payer: Medicare Other | Admitting: Anesthesiology

## 2021-02-15 ENCOUNTER — Encounter (HOSPITAL_COMMUNITY): Payer: Self-pay | Admitting: Internal Medicine

## 2021-02-15 DIAGNOSIS — Z800 Family history of malignant neoplasm of digestive organs: Secondary | ICD-10-CM | POA: Insufficient documentation

## 2021-02-15 DIAGNOSIS — Z8042 Family history of malignant neoplasm of prostate: Secondary | ICD-10-CM | POA: Diagnosis not present

## 2021-02-15 DIAGNOSIS — Z1211 Encounter for screening for malignant neoplasm of colon: Secondary | ICD-10-CM | POA: Insufficient documentation

## 2021-02-15 DIAGNOSIS — Z885 Allergy status to narcotic agent status: Secondary | ICD-10-CM | POA: Insufficient documentation

## 2021-02-15 DIAGNOSIS — Z85038 Personal history of other malignant neoplasm of large intestine: Secondary | ICD-10-CM

## 2021-02-15 DIAGNOSIS — K649 Unspecified hemorrhoids: Secondary | ICD-10-CM | POA: Insufficient documentation

## 2021-02-15 DIAGNOSIS — K6389 Other specified diseases of intestine: Secondary | ICD-10-CM | POA: Diagnosis not present

## 2021-02-15 DIAGNOSIS — Z9049 Acquired absence of other specified parts of digestive tract: Secondary | ICD-10-CM | POA: Diagnosis not present

## 2021-02-15 DIAGNOSIS — Z09 Encounter for follow-up examination after completed treatment for conditions other than malignant neoplasm: Secondary | ICD-10-CM | POA: Diagnosis not present

## 2021-02-15 DIAGNOSIS — K9189 Other postprocedural complications and disorders of digestive system: Secondary | ICD-10-CM | POA: Diagnosis not present

## 2021-02-15 DIAGNOSIS — Z881 Allergy status to other antibiotic agents status: Secondary | ICD-10-CM | POA: Diagnosis not present

## 2021-02-15 DIAGNOSIS — E039 Hypothyroidism, unspecified: Secondary | ICD-10-CM | POA: Diagnosis not present

## 2021-02-15 DIAGNOSIS — Z944 Liver transplant status: Secondary | ICD-10-CM | POA: Insufficient documentation

## 2021-02-15 DIAGNOSIS — Z79899 Other long term (current) drug therapy: Secondary | ICD-10-CM | POA: Diagnosis not present

## 2021-02-15 DIAGNOSIS — Z7982 Long term (current) use of aspirin: Secondary | ICD-10-CM | POA: Diagnosis not present

## 2021-02-15 DIAGNOSIS — Z801 Family history of malignant neoplasm of trachea, bronchus and lung: Secondary | ICD-10-CM | POA: Diagnosis not present

## 2021-02-15 DIAGNOSIS — Z86010 Personal history of colonic polyps: Secondary | ICD-10-CM

## 2021-02-15 DIAGNOSIS — Z1509 Genetic susceptibility to other malignant neoplasm: Secondary | ICD-10-CM

## 2021-02-15 DIAGNOSIS — K644 Residual hemorrhoidal skin tags: Secondary | ICD-10-CM | POA: Diagnosis not present

## 2021-02-15 DIAGNOSIS — K633 Ulcer of intestine: Secondary | ICD-10-CM | POA: Insufficient documentation

## 2021-02-15 DIAGNOSIS — K5989 Other specified functional intestinal disorders: Secondary | ICD-10-CM | POA: Diagnosis not present

## 2021-02-15 HISTORY — PX: BIOPSY: SHX5522

## 2021-02-15 HISTORY — PX: FLEXIBLE SIGMOIDOSCOPY: SHX5431

## 2021-02-15 LAB — SARS CORONAVIRUS 2 (TAT 6-24 HRS): SARS Coronavirus 2: NEGATIVE

## 2021-02-15 SURGERY — SIGMOIDOSCOPY, FLEXIBLE
Anesthesia: General

## 2021-02-15 MED ORDER — STERILE WATER FOR IRRIGATION IR SOLN
Status: DC | PRN
  Administered 2021-02-15: 100 mL

## 2021-02-15 MED ORDER — CHLORHEXIDINE GLUCONATE CLOTH 2 % EX PADS
6.0000 | MEDICATED_PAD | Freq: Once | CUTANEOUS | Status: AC
Start: 2021-02-15 — End: 2021-02-15
  Administered 2021-02-15: 6 via TOPICAL

## 2021-02-15 MED ORDER — PROPOFOL 500 MG/50ML IV EMUL
INTRAVENOUS | Status: DC | PRN
  Administered 2021-02-15: 200 ug/kg/min via INTRAVENOUS

## 2021-02-15 MED ORDER — LACTATED RINGERS IV SOLN: INTRAVENOUS | Status: DC

## 2021-02-15 MED ORDER — PROPOFOL 10 MG/ML IV BOLUS
INTRAVENOUS | Status: DC | PRN
  Administered 2021-02-15: 50 mg via INTRAVENOUS

## 2021-02-15 NOTE — Anesthesia Postprocedure Evaluation (Signed)
Anesthesia Post Note  Patient: Cassidy Barber  Procedure(s) Performed: FLEXIBLE SIGMOIDOSCOPY WITH PROPOFOL (N/A ) BIOPSY  Patient location during evaluation: Short Stay Anesthesia Type: General Level of consciousness: awake and alert Pain management: pain level controlled Vital Signs Assessment: post-procedure vital signs reviewed and stable Respiratory status: spontaneous breathing Cardiovascular status: blood pressure returned to baseline and stable Postop Assessment: no apparent nausea or vomiting Anesthetic complications: no   No complications documented.   Last Vitals:  Vitals:   02/15/21 0742 02/15/21 0922  BP: 131/82 133/70  Pulse: 74 71  Resp: 15 18  Temp: 36.7 C 36.6 C  SpO2: 100% 100%    Last Pain:  Vitals:   02/15/21 0922  TempSrc: Oral  PainSc: 0-No pain                 Dossie Swor

## 2021-02-15 NOTE — Discharge Instructions (Signed)
No aspirin or NSAIDs for 24 hours. Resume usual medications and diet as before. No driving for 24 hours. Physician will call with biopsy results.   Flexible Sigmoidoscopy Flexible sigmoidoscopy is a procedure to check the health of your lower colon (sigmoid colon). The procedure is done using a short, flexible tube, called a sigmoidoscope, that has a small camera with a light on the end. The sigmoidoscope is inserted into the opening between the buttocks (anus) and passed through the rectum into the sigmoid colon. The camera on the scope sends images of the sigmoid colon and rectum to a TV monitor in the exam room. You may need this procedure if you have or have had:  Changes in your bowel habits.  Blood in your stool.  Pain in your abdomen.  Unexplained weight loss. You may also need this test if your health care provider wants to check for abnormal growths in your rectum or sigmoid colon. Tell a health care provider about:  Any allergies you have.  All medicines you are taking, including vitamins, herbs, eye drops, creams, and over-the-counter medicines.  Any problems you or family members have had with anesthetic medicines.  Any blood disorders you have.  Any surgeries you have had.  Any medical conditions you have.  Whether you are pregnant or may be pregnant. What are the risks? Generally, this is a safe procedure. However, problems may occur, including:  Bleeding.  A tear (perforation) through your rectum or colon. This is rare.  Infection. This is rare. What happens before the procedure? Eating and drinking restrictions Follow instructions from your health care provider about eating or drinking restrictions. These instructions may include the following:  Starting 24 hours before the procedure: ? Follow a clear liquid diet. This diet may include:  Gelatin-based desserts.  Clear liquids, such as water, clear juice, clear broth or bouillon, clear soft drinks or  sports drinks, or coffee or tea without milk or cream. ? Avoid drinking liquids that contain red or purple dye 24 hours before the procedure. ? Do not eat solid foods.  In the 2 hours before the procedure, or within the time period that your health care provider recommends, do not eat or drink anything. Bowel prep You will need to do a bowel prep the night before or the morning of the procedure. Follow instructions exactly as given by your health care provider. This is important. The bowel prep empties the colon of stool so that your health care provider can have a clear view of your bowel during the procedure. The bowel prep may include one or more of the following:  An enema. This is liquid medicine injected into your rectum.  A suppository. This is a solid medicine inserted into your rectum.  Laxative medicines. These will help you have a bowel movement. Laxatives come in different forms and may be prescribed as: ? A pill that you swallow. ? A powder that you add to water and drink. ? A liquid laxative that you drink over a scheduled amount of time. Medicines Ask your health care provider about:  Changing or stopping your regular medicines. This is especially important if you are taking iron supplements, diabetes medicines, or blood thinners.  Taking medicines such as aspirin and ibuprofen. These medicines can thin your blood. Do not take these medicines unless your health care provider tells you to take them.  Taking over-the-counter medicines, vitamins, herbs, and supplements. General instructions  Ask your health care provider what steps will be  taken to prevent infection.  If you will be given a medicine to help you relax (sedative): ? Plan to have someone take you home from the hospital or clinic. ? Plan to have someone with you for 24 hours. What happens during the procedure?  An IV may be inserted into one of your veins.  You may be given a sedative.  You will lie on  your side on the exam table. You may be asked to change positions during the procedure.  The sigmoidoscope will have oil or gel applied to it (will be lubricated) and gently inserted into your anus.  Air will be injected into your colon, and the sigmoidoscope will be moved through your rectum and into your sigmoid colon. You may feel some pressure or cramping.  Images will appear on a monitor in the exam room. The images will be checked for any abnormal findings.  A small tissue sample may be removed to be looked at later under a microscope (biopsy).  If any small growths (polyps) are found, they may be removed and checked for cancer cells.  The scope will be slowly removed. The procedure may vary among health care providers and hospitals.   What happens after the procedure?  Your blood pressure, heart rate, breathing rate, and blood oxygen level will be monitored until you leave the hospital or clinic.  If you were given a sedative during the procedure, it can affect you for several hours. Do not drive or operate machinery until your health care provider says that it is safe.  It is up to you to get the results of your procedure. Ask your health care provider, or the department that is doing the procedure, when your results will be ready. Summary  A flexible sigmoidoscopy is a procedure that looks at the lower part of the colon, known as the sigmoid colon.  A flexible tube with a camera and light on the end is inserted into the opening between the buttocks, or anus, and then passed through the rectum into the sigmoid colon. The camera sends images of the sigmoid colon and rectum to a TV monitor.  Follow instructions from your health care provider about eating and drinking before the procedure.  Follow instructions from your health care provider for doing a bowel prep before the procedure. The bowel prep empties the colon of stool so that your health care provider can have a clear view  of your bowel during the procedure. This information is not intended to replace advice given to you by your health care provider. Make sure you discuss any questions you have with your health care provider. Document Revised: 10/26/2019 Document Reviewed: 10/26/2019 Elsevier Patient Education  2021 [employer].

## 2021-02-15 NOTE — Op Note (Addendum)
[facility] Patient Name: Cassidy Barber Procedure Date: 02/15/2021 8:33 AM MRN: [id number] Date of Birth: [birth year] Attending MD: Hildred Laser , MD CSN: [id number] Age: 70 Admit Type: Outpatient Procedure:                Flexible Sigmoidoscopy Indications:              High risk colon cancer surveillance: Personal                            history of colon cancer; patient carries gene for                            Lynch syndrome. Providers:                Hildred Laser, MD, Rosina Lowenstein, RN, Nelma Rothman,                            Technician Referring MD:             Theador Hawthorne Hawks, FNP Medicines:                Propofol per Anesthesia Complications:            No immediate complications. Estimated Blood Loss:     Estimated blood loss was minimal. Estimated blood                            loss was minimal. Procedure:                Pre-Anesthesia Assessment:                           - Prior to the procedure, a History and Physical                            was performed, and patient medications and                            allergies were reviewed. The patient's tolerance of                            previous anesthesia was also reviewed. The risks                            and benefits of the procedure and the sedation                            options and risks were discussed with the patient.                            All questions were answered, and informed consent                            was obtained. Prior Anticoagulants: The patient has  taken no previous anticoagulant or antiplatelet                            agents. ASA Grade Assessment: III - A patient with                            severe systemic disease. After reviewing the risks                            and benefits, the patient was deemed in                            satisfactory condition to undergo the procedure.                           After obtaining informed  consent, the scope was                            passed under direct vision. The [id number]-[id number]                            ([id number]) scope was introduced through the anus and                            advanced to the 40 cm from the anal verge. The                            flexible sigmoidoscopy was accomplished without                            difficulty. The patient tolerated the procedure                            well. The quality of the bowel preparation was                            excellent. Scope In: 9:00:57 AM Scope Out: 9:13:43 AM Total Procedure Duration: 0 hours 12 minutes 46 seconds  Findings:      The perianal and digital rectal examinations were normal.      An area of congested mucosa was found at 30 cm proximal to the stoma.       The pathology specimen was placed into Bottle Number 1.      Two non-bleeding erosions were found at the anastomosis. Biopsies were       taken with a cold forceps for histology. The pathology specimen was       placed into Bottle Number 2.      The rectum, recto-sigmoid colon and distal sigmoid colon appeared normal.      External hemorrhoids were found during retroflexion. The hemorrhoids       were small.      Anal papilla(e) were hypertrophied. Impression:               - Mucosal edema to mucosa of distal small bowel.  Biopsied.                           - Erosions noted at ileocolonic anastamosis.                            Biopsied.                           - Normal Mucosa of distal sigmoid colon and rectum.                           - Smallexternal hemorrhoids                           - Anal papilla(e) were hypertrophied.                           - Specimens collected. Moderate Sedation:      Per Anesthesia Care Recommendation:           - Discharge patient to home (with spouse).                           - Resume previous diet today.                           - No aspirin, ibuprofen, naproxen, or  other                            non-steroidal anti-inflammatory drugs for 1 day.                           - Continue present medications.                           - Await pathology results.                           - Repeat flexible sigmoidoscopy in 1 year for                            surveillance. Procedure Code(s):        --- Professional ---                           [phone], Sigmoidoscopy, flexible; with biopsy, single                            or multiple Diagnosis Code(s):        --- Professional ---                           C62.376, Personal history of other malignant                            neoplasm of large intestine CPT copyright 2019 American Medical Association. All rights reserved. The codes documented in this report are preliminary and upon  coder review may  be revised to meet current compliance requirements. Hildred Laser, MD Hildred Laser, MD 02/15/2021 9:35:54 AM This report has been signed electronically. Number of Addenda: 0

## 2021-02-15 NOTE — Transfer of Care (Signed)
Immediate Anesthesia Transfer of Care Note  Patient: Cassidy Barber  Procedure(s) Performed: FLEXIBLE SIGMOIDOSCOPY WITH PROPOFOL (N/A ) BIOPSY  Patient Location: Short Stay  Anesthesia Type:General  Level of Consciousness: awake  Airway & Oxygen Therapy: Patient Spontanous Breathing  Post-op Assessment: Report given to RN  Post vital signs: Reviewed and stable  Last Vitals:  Vitals Value Taken Time  BP    Temp    Pulse    Resp    SpO2      Last Pain:  Vitals:   02/15/21 0919  TempSrc: Oral  PainSc:          Complications: No complications documented.

## 2021-02-15 NOTE — Anesthesia Preprocedure Evaluation (Signed)
Anesthesia Evaluation  Patient identified by MRN, date of birth, ID band Patient awake    Reviewed: Allergy & Precautions, H&P , NPO status , Patient's Chart, lab work & pertinent test results, reviewed documented beta blocker date and time   Airway Mallampati: II  TM Distance: >3 FB Neck ROM: full    Dental no notable dental hx.    Pulmonary neg pulmonary ROS,    Pulmonary exam normal breath sounds clear to auscultation       Cardiovascular Exercise Tolerance: Good hypertension, negative cardio ROS   Rhythm:regular Rate:Normal     Neuro/Psych PSYCHIATRIC DISORDERS Anxiety negative neurological ROS     GI/Hepatic Neg liver ROS, GERD  Medicated,  Endo/Other  Hypothyroidism   Renal/GU negative Renal ROS  negative genitourinary   Musculoskeletal   Abdominal   Peds  Hematology  (+) Blood dyscrasia, anemia ,   Anesthesia Other Findings H/O: liver transplant, PE, multiple skin cancers, colon CA  Reproductive/Obstetrics negative OB ROS                             Anesthesia Physical Anesthesia Plan  ASA: III  Anesthesia Plan: General   Post-op Pain Management:    Induction:   PONV Risk Score and Plan: Propofol infusion  Airway Management Planned:   Additional Equipment:   Intra-op Plan:   Post-operative Plan:   Informed Consent: I have reviewed the patients History and Physical, chart, labs and discussed the procedure including the risks, benefits and alternatives for the proposed anesthesia with the patient or authorized representative who has indicated his/her understanding and acceptance.     Dental Advisory Given  Plan Discussed with: CRNA  Anesthesia Plan Comments:         Anesthesia Quick Evaluation

## 2021-02-15 NOTE — H&P (Signed)
Cassidy Barber is an 70 y.o. female.   Chief Complaint: Patient is here for flexible sigmoidoscopy. HPI: Patient is 70 year old Caucasian female with complicated GI history who is status post live donor liver transplant about 7 years ago who also has history of colon carcinoma/Lynch syndrome who presented with diarrhea.  Stool cultures were negative.  She was treated with antibiotics empirically did not respond.  Fecal lactoferrin came back positive.  She was retreated with Cipro and metronidazole and feels better.  She is undergoing flexible sigmoidoscopy for surveillance purposes.  Her last flexible sigmoidoscopy was in February 2021.  She has not experienced rectal bleeding.  Past Medical History:  Diagnosis Date  . Abdominal wall hernia    Incarcerated status post surgical repair 2019 - [facility]  . Anemia of chronic disease   . Atypical nevus 01/21/2018   atypical neoplasm- Left scalp-ant (txpbx + MOHS), atypical neoplasm- Left scalp post- (txpbx + MOHS)  . Basal cell carcinoma   . Colon cancer [facility])    Colon surgery 2005 and 2012  . History of pulmonary hypertension    Pre liver transplant  . History of seizures   . Hypertension   . Hypothyroidism   . Lynch syndrome   . Osteopenia   . Primary biliary cirrhosis (HCC)    Status post liver transplantation - follows at [facility]  . SCCA (squamous cell carcinoma) of skin 02/23/2020   Right Upper Chest(moderate) (MOH's)  . SCCA (squamous cell carcinoma) of skin 04/07/2020   Left Top Leg (Keratoacanthoma) treatment after biopsy  . SCCA (squamous cell carcinoma) of skin 04/07/2020   Left Foot Dorsal (in situ) treatment after biopsy  . Squamous cell carcinoma of skin 04/22/2018   KA-Right mid chest (txpbx), KA-left elbow crease (txpbx), insitu-Right mid chest inf. (exc)  . Squamous cell carcinoma of skin 05/20/2018   well diff-Left upper shin (txpbx), well diff-Right lower forearm (txpbx), well diff-Right upper shin (txpbx)  . Squamous cell  carcinoma of skin 06/11/2018   Scc + margin-Right mid chest inferior   . Squamous cell carcinoma of skin 06/27/2018   well diff-Left mid thigh(txpbx), well diff-Left inner thigh (txpbx),insitu-Right cheek (txpbx),well diff-right inner heel (txpbx)  . Squamous cell carcinoma of skin 09/16/2018   well diff-left shoulder (txpbx), well diff-Right chin (txpbx), well diff-right chest lateral (CX35FU)  . Squamous cell carcinoma of skin 10/01/2018   Right outer lower shin (Txpbx)  . Squamous cell carcinoma of skin 04/03/2019   well diff-Right center chest (MOHS), in situ-Right ear  . Squamous cell carcinoma of skin 08/05/2019   in situ-Left calf (txpbx), in situ-left bicep (txpbx), well diff-Left chest,inf(txpbx), in situ-Right chest inf-(txpbx)  . Squamous cell carcinoma of skin 11/19/2019   KA-left top leg (txpbx), modify-Riight forehead-(mohs), in situ-right hand (txpbx), in situ-Right forearm (txpbx), well diff-Right chest (txpbx), well diff-chin (txpbx)  . Squamous cell carcinoma of skin 01/06/2020   KA- Left top leg  . Squamous cell carcinoma of skin 05/12/2013   bowens-middle of chest (CX35FU)  . Squamous cell carcinoma of skin 05/18/2015   well diff-Left upper arm (CX35FU + Exc),KA-right chest(txpbx), in situ-Left shin (txpbx), well diff-Right cheek (CX35FU), KA-Left post scalp (CX35FU)  . Squamous cell carcinoma of skin 08/09/2015   KA-Left post scalp ((MOHS), in situ- mid chest (Txpbx +exc), in situ-Left upper arm inferior (txpbx)  . Squamous cell carcinoma of skin 10/13/2015   Left upper arm-clear  . Squamous cell carcinoma of skin 03/09/2016   mod diff-mid chest (txpbx+ exc), mod  diff-Right chest (txpbx+exc), well diff-right cheek-(txpbx),well diff-Left hand-(txpbx), in situ-Left upper arm (txpbx), well diff-Right cheek -(txpbx), well diff-Right crease arm (txpbx)   . Squamous cell carcinoma of skin 05/24/2016   well diff-Right nasal crease-(MOHS)  . Squamous cell carcinoma of skin  08/02/2016   KA-Left chest med (txpbx)  . Squamous cell carcinoma of skin 08/30/2016   in situ-Left outer zygoma (txpbx)  . Squamous cell carcinoma of skin 12/06/2016   well diff-Left chest sup, Left shoudler, insitu- right post scalp  . Squamous cell carcinoma of skin 02/14/2017   well diff-Left forearm (EXC),in situ-RIght ant neck  . Squamous cell carcinoma of skin 06/14/2017   in situ-Right forearm (txpbx), in situ-Right chest (txpbx), well diff-left chest (txpbx), well diff-anterior neck- (txpbx)  . Squamous cell carcinoma of skin 08/07/2017   well diff-Left upper shoulder (txpbx), sup and invasive-Left temple (txpbx), well diff-Right upper shin (txpbx), in situ-Right clavicle (txpbx)  . Squamous cell carcinoma of skin 10/17/2017   well diff-ant. neck (MOHS), in situ-Right chest, inf (txpbx)  . Squamous cell carcinoma of skin 01/21/2018   well diff- Right chest,ulnar (txpbx), well diff- right upper chest (txpbx), in situ-Right ant. crown (txpbx)  . Squamous cell carcinoma of skin 08/02/2020   well diff-left lower leg-inferior (Txpbx)  . Squamous cell carcinoma of skin 08/02/2020   well diff-right lower leg-mid (txpbx)  . Squamous cell carcinoma of skin 08/02/2020   well diff-left chest upper  . Squamous cell carcinoma of skin 08/02/2020   well diff-mid parietal scalp (MOHS)  . Squamous cell carcinoma of skin 08/02/2020   well diff-right foot inner(txpbx)  . Squamous cell carcinoma of skin 08/02/2020   well diff- left lower leg medial (txpbx)  . Squamous cell carcinoma of skin 08/02/2020   well diff-left lower leg anterior (txpbx)  . Squamous cell carcinoma of skin 08/02/2020   well diff-left lower leg medial (txpbx)  . Squamous cell carcinoma of skin 08/02/2020   well diff-right forearm-posterior (txpbx)    Past Surgical History:  Procedure Laterality Date  . ABDOMINAL HERNIA REPAIR     Patient's states that she has had 8- 9 hernia surgeries  . ABDOMINAL HYSTERECTOMY    .  CATARACT EXTRACTION W/PHACO Right 01/15/2020   Procedure: CATARACT EXTRACTION PHACO AND INTRAOCULAR LENS PLACEMENT (IOC);  Surgeon: Baruch Goldmann, MD;  Location: [facility];  Service: Ophthalmology;  Laterality: Right;  CDE: 7.89  . CATARACT EXTRACTION W/PHACO Left 01/29/2020   Procedure: CATARACT EXTRACTION PHACO AND INTRAOCULAR LENS PLACEMENT (IOC) (CDE: 6.33);  Surgeon: Baruch Goldmann, MD;  Location: [facility];  Service: Ophthalmology;  Laterality: Left;  . CHOLECYSTECTOMY  2007  . COLON RESECTION    . COLON SURGERY  2008   Done at [facility]  . COLONOSCOPY     Done at [facility]  . ESOPHAGOGASTRODUODENOSCOPY N/A 08/21/2018   Procedure: ESOPHAGOGASTRODUODENOSCOPY (EGD);  Surgeon: Rogene Houston, MD;  [facility]: [facility];  Service: Endoscopy;  Laterality: N/A;  . ESOPHAGOGASTRODUODENOSCOPY (EGD) WITH PROPOFOL N/A 12/16/2019   Procedure: ESOPHAGOGASTRODUODENOSCOPY (EGD) WITH PROPOFOL;  Surgeon: Rogene Houston, MD;  [facility]: [facility];  Service: Endoscopy;  Laterality: N/A;  . EYE SURGERY     lasix  . FLEXIBLE SIGMOIDOSCOPY N/A 10/20/2015   Procedure: FLEXIBLE SIGMOIDOSCOPY;  Surgeon: Rogene Houston, MD;  Location: [facility];  Service: Endoscopy;  Laterality: N/A;  14 - Dr Laural Golden has meeting until 1:00  . FLEXIBLE SIGMOIDOSCOPY N/A 07/11/2016   Procedure: FLEXIBLE SIGMOIDOSCOPY;  Surgeon: Rogene Houston,  MD;  [facility]: [facility];  Service: Endoscopy;  Laterality: N/A;  1200  . FLEXIBLE SIGMOIDOSCOPY N/A 08/09/2017   Procedure: FLEXIBLE SIGMOIDOSCOPY;  Surgeon: Rogene Houston, MD;  [facility]: [facility];  Service: Endoscopy;  Laterality: N/A;  1:00  . FLEXIBLE SIGMOIDOSCOPY N/A 08/21/2018   Procedure: FLEXIBLE SIGMOIDOSCOPY;  Surgeon: Rogene Houston, MD;  [facility];  Service: Endoscopy;  Laterality: N/A;  . FLEXIBLE SIGMOIDOSCOPY N/A 12/16/2019   Procedure: FLEXIBLE SIGMOIDOSCOPY wirh Propofol;  Surgeon: Rogene Houston, MD;  [facility];  Service: Endoscopy;   Laterality: N/A;  7:30  . FRACTURE SURGERY     right wrist metal plate  . HERNIA REPAIR    . LIVER TRANSPLANT  [id number]  . multiple skin cancers removed    . POLYPECTOMY  08/09/2017   Procedure: POLYPECTOMY;  Surgeon: Rogene Houston, MD;  Location: [facility];  Service: Endoscopy;;  colon small bowel  . REVERSE SHOULDER ARTHROPLASTY Left 07/17/2018  . REVERSE SHOULDER ARTHROPLASTY Left 07/17/2018   Procedure: LEFT REVERSE SHOULDER ARTHROPLASTY;  Surgeon: Justice Britain, MD;  Location: [place];  Service: Orthopedics;  Laterality: Left;  166min  . SHOULDER CLOSED REDUCTION Left 09/27/2019   Procedure: CLOSED REDUCTION SHOULDER;  Surgeon: Paralee Cancel, MD;  Location: [facility];  Service: Orthopedics;  Laterality: Left;  . SPLENECTOMY  2006  . TOTAL SHOULDER REVISION Left 11/12/2019   Procedure: Revision Left Reverse Shoulder Arthroplasty with poly exchange SDD;  Surgeon: Justice Britain, MD;  Location: [facility];  Service: Orthopedics;  Laterality: Left;  150min -SDDC  . TYMPANOSTOMY TUBE PLACEMENT    . UPPER GASTROINTESTINAL ENDOSCOPY     Done at [facility]    Family History  Problem Relation Age of Onset  . Prostate cancer Father   . Colon cancer Father   . Colon cancer Sister   . Lung cancer Sister   . Healthy Son   . Alcohol abuse Brother   . Allergic rhinitis Neg Hx   . Asthma Neg Hx   . Eczema Neg Hx   . Urticaria Neg Hx    Social History:  reports that she has never smoked. She has never used smokeless tobacco. She reports that she does not drink alcohol and does not use drugs.  Allergies:  Allergies  Allergen Reactions  . Ciprofloxacin Itching  . Codeine Nausea Only    Medications Prior to Admission  Medication Sig Dispense Refill  . acetaminophen (TYLENOL) 500 MG tablet Take 1,000 mg by mouth every 6 (six) hours as needed (for pain.).     [name] alendronate (FOSAMAX) 70 MG tablet TAKE 1 TABLET WEEKLY (TAKE WITH 8OZ OF WATER 30 MINUTES BEFORE BREAKFAST) (Patient taking differently:  Take 70 mg by mouth every Thursday. (TAKE WITH 8OZ OF WATER 30 MINUTES BEFORE BREAKFAST)) 12 tablet 2  . ALPRAZolam (XANAX) 0.5 MG tablet Take 1 tablet (0.5 mg total) by mouth at bedtime. 90 tablet 1  . aspirin EC 81 MG tablet Take 81 mg by mouth daily.    . Calcium Citrate-Vitamin D (CALCIUM CITRATE + D3 PO) Take 600 mg by mouth 2 (two) times daily.    . cetirizine (ZYRTEC) 10 MG tablet TAKE 1 TO 2 TABLETS TWICE DAILY (Patient taking differently: Take 10-20 mg by mouth daily.) 120 tablet 5  . ciprofloxacin (CIPRO) 250 MG tablet Take 1 tablet (250 mg total) by mouth 2 (two) times daily. 14 tablet 0  . clotrimazole-betamethasone (LOTRISONE) cream Apply 1 application topically 2 (  two) times daily. (Patient taking differently: Apply 1 application topically daily as needed (Rash).) 30 g 0  . Cyanocobalamin (VITAMIN B-12) 5000 MCG TBDP Take 5,000 mcg by mouth every Monday, Wednesday, and Friday.     . diphenoxylate-atropine (LOMOTIL) 2.5-0.025 MG tablet Take 1 tablet by mouth 3 (three) times daily as needed for diarrhea or loose stools. 90 tablet 0  . famotidine (PEPCID) 20 MG tablet Take 1 tablet (20 mg total) by mouth 2 (two) times daily. (Needs to be seen before next refill) (Patient taking differently: Take 20 mg by mouth 2 (two) times daily.) 60 tablet 0  . fluorouracil (EFUDEX) 5 % cream Apply 1 application topically daily as needed (cancer spots).   0  . gabapentin (NEURONTIN) 100 MG capsule Take 1 capsule (100 mg total) by mouth 3 (three) times daily. 90 capsule 3  . levothyroxine (SYNTHROID) 112 MCG tablet Take 1 tablet (112 mcg total) by mouth daily. 30 tablet 11  . losartan (COZAAR) 25 MG tablet TAKE ONE (1) TABLET EACH DAY (Patient taking differently: Take 25 mg by mouth daily.) 90 tablet 2  . metoprolol succinate (TOPROL XL) 25 MG 24 hr tablet Take 1 tablet (25 mg total) by mouth daily. 90 tablet 4  . metroNIDAZOLE (FLAGYL) 250 MG tablet Take 1 tablet (250 mg total) by mouth 3 (three) times  daily. 21 tablet 0  . mupirocin ointment (BACTROBAN) 2 % Apply 1 application topically daily as needed (Cancer spots).    . NIACINAMIDE-ZINC-FOLIC ACID PO Take 2 tablets by mouth 2 (two) times daily.    . pantoprazole (PROTONIX) 40 MG tablet Take 1 tablet (40 mg total) by mouth 2 (two) times daily. 180 tablet 0  . SSD 1 % cream Apply 1 application topically daily as needed (Radiation).    Manus Gunning BOWEL PREP KIT 17.5-3.13-1.6 GM/177ML SOLN Take by mouth.    . ursodiol (ACTIGALL) 250 MG tablet Take 250 mg by mouth 2 (two) times daily.     . Vitamin D, Ergocalciferol, (DRISDOL) 1.25 MG (50000 UNIT) CAPS capsule TAKE 1 CAPSULE EVERY WEEK (Patient taking differently: Take 50,000 Units by mouth every Thursday.) 12 capsule 2  . vitamin E 180 MG (400 UNITS) capsule Take 400 Units by mouth daily.    [name] ZORTRESS 0.5 MG TABS Take 2 mg by mouth 2 (two) times daily.     . cyclobenzaprine (FLEXERIL) 5 MG tablet Take 1 tablet (5 mg total) by mouth 3 (three) times daily as needed for muscle spasms. (Patient not taking: Reported on 02/09/2021) 60 tablet 1    Results for orders placed or performed during the hospital encounter of 02/14/21 (from the past 48 hour(s))  SARS CORONAVIRUS 2 (TAT 6-24 HRS) Nasopharyngeal Nasopharyngeal Swab     Status: None   Collection Time: 02/14/21  8:34 AM   Specimen: Nasopharyngeal Swab  Result Value Ref Range   SARS Coronavirus 2 NEGATIVE NEGATIVE    Comment: (NOTE) SARS-CoV-2 target nucleic acids are NOT DETECTED.  The SARS-CoV-2 RNA is generally detectable in upper and lower respiratory specimens during the acute phase of infection. Negative results do not preclude SARS-CoV-2 infection, do not rule out co-infections with other pathogens, and should not be used as the sole basis for treatment or other patient management decisions. Negative results must be combined with clinical observations, patient history, and epidemiological information. The expected result is  Negative.  Fact Sheet for Patients: SugarRoll.be  Fact Sheet for Healthcare Providers: https://www.woods-mathews.com/  This test is not yet  approved or cleared by the [place] and  has been authorized for detection and/or diagnosis of SARS-CoV-2 by FDA under an Emergency Use Authorization (EUA). This EUA will remain  in effect (meaning this test can be used) for the duration of the COVID-19 declaration under Se ction 564(b)(1) of the Act, 21 U.S.C. section 360bbb-3(b)(1), unless the authorization is terminated or revoked sooner.  Performed at [facility], [place] [address], [place], [place] [address]    No results found.  Review of Systems  Blood pressure 131/82, pulse 74, temperature 98.1 F (36.7 C), temperature source Oral, resp. rate 15, height $RemoveBe'5\' 4"'CJXMWsMOY$  (1.626 m), weight 64 kg, SpO2 100 %. Physical Exam HENT:     Mouth/Throat:     Mouth: Mucous membranes are moist.     Pharynx: Oropharynx is clear.  Eyes:     General: No scleral icterus.    Conjunctiva/sclera: Conjunctivae normal.  Cardiovascular:     Rate and Rhythm: Normal rate and regular rhythm.     Heart sounds: Normal heart sounds. No murmur heard.   Pulmonary:     Effort: Pulmonary effort is normal.     Breath sounds: Normal breath sounds.  Abdominal:     Comments: Abdomen is symmetrical.  Multiple scars noted.  On palpation soft and nontender with organomegaly or masses.  Musculoskeletal:        General: No swelling.     Cervical back: Neck supple.  Lymphadenopathy:     Cervical: No cervical adenopathy.  Skin:    General: Skin is warm and dry.  Neurological:     Mental Status: She is alert.      Assessment/Plan  History of colon carcinoma.  Patient carries gene for Lynch syndrome. Recent bout of diarrhea responding to empiric antibiotic therapy. Surveillance flexible sigmoidoscopy.  Hildred Laser, MD 02/15/2021, 8:47 AM

## 2021-02-16 LAB — SURGICAL PATHOLOGY

## 2021-02-17 ENCOUNTER — Encounter (HOSPITAL_COMMUNITY): Payer: Self-pay

## 2021-02-17 ENCOUNTER — Other Ambulatory Visit: Payer: Self-pay

## 2021-02-17 ENCOUNTER — Inpatient Hospital Stay (HOSPITAL_COMMUNITY): Payer: Medicare Other

## 2021-02-17 VITALS — BP 133/76 | HR 64 | Temp 97.3°F | Resp 18

## 2021-02-17 DIAGNOSIS — D509 Iron deficiency anemia, unspecified: Secondary | ICD-10-CM | POA: Diagnosis not present

## 2021-02-17 DIAGNOSIS — D508 Other iron deficiency anemias: Secondary | ICD-10-CM

## 2021-02-17 MED ORDER — SODIUM CHLORIDE 0.9 % IV SOLN
200.0000 mg | Freq: Once | INTRAVENOUS | Status: AC
Start: 2021-02-17 — End: 2021-02-17
  Administered 2021-02-17: 200 mg via INTRAVENOUS
  Filled 2021-02-17: qty 200

## 2021-02-17 MED ORDER — SODIUM CHLORIDE 0.9 % IV SOLN: Freq: Once | INTRAVENOUS | Status: AC

## 2021-02-17 NOTE — Patient Instructions (Signed)
[facility] at [facility]  Discharge Instructions:   _______________________________________________________________  Thank you for choosing [facility] at [facility] to provide your oncology and hematology care.  To afford each patient quality time with our providers, please arrive at least 15 minutes before your scheduled appointment.  You need to re-schedule your appointment if you arrive 10 or more minutes late.  We strive to give you quality time with our providers, and arriving late affects you and other patients whose appointments are after yours.  Also, if you no show three or more times for appointments you may be dismissed from the clinic.  Again, thank you for choosing [facility] at [facility]. Our hope is that these requests will allow you access to exceptional care and in a timely manner. _______________________________________________________________  If you have questions after your visit, please contact our office at [phone] between the hours of 8:30 a.m. and 5:00 p.m. Voicemails left after 4:30 p.m. will not be returned until the following business day. _______________________________________________________________  For prescription refill requests, have your pharmacy contact our office. _______________________________________________________________  Recommendations made by the consultant and any test results will be sent to your referring physician. _______________________________________________________________ 

## 2021-02-17 NOTE — Progress Notes (Signed)
Patient presents today for Venofer infusion. Vital signs stable. Patient denies any changes since her last visit. MAR reviewed and updated.   Venofer given today per MD orders. Tolerated infusion without adverse affects. Vital signs stable. No complaints at this time. Discharged from clinic ambulatory in stable condition. Alert and oriented x 3. F/U with [facility] as scheduled.

## 2021-02-20 ENCOUNTER — Other Ambulatory Visit: Payer: Self-pay

## 2021-02-20 ENCOUNTER — Inpatient Hospital Stay (HOSPITAL_COMMUNITY): Payer: Medicare Other

## 2021-02-20 VITALS — BP 98/59 | HR 66 | Temp 97.1°F | Resp 18

## 2021-02-20 DIAGNOSIS — D509 Iron deficiency anemia, unspecified: Secondary | ICD-10-CM | POA: Diagnosis not present

## 2021-02-20 DIAGNOSIS — D508 Other iron deficiency anemias: Secondary | ICD-10-CM

## 2021-02-20 MED ORDER — SODIUM CHLORIDE 0.9 % IV SOLN
200.0000 mg | Freq: Once | INTRAVENOUS | Status: AC
  Administered 2021-02-20: 200 mg via INTRAVENOUS
  Filled 2021-02-20: qty 200

## 2021-02-20 MED ORDER — SODIUM CHLORIDE 0.9 % IV SOLN: Freq: Once | INTRAVENOUS | Status: AC

## 2021-02-20 NOTE — Patient Instructions (Signed)
Iron Sucrose infusion What is this medicine? IRON SUCROSE (AHY ern SOO krohs) is an iron complex. Iron is used to make healthy red blood cells, which carry oxygen and nutrients throughout the body. This medicine is used to treat iron deficiency anemia in people with chronic kidney disease. This medicine may be used for other purposes; ask your health care provider or pharmacist if you have questions. COMMON BRAND NAME(S): Venofer What should I tell my health care provider before I take this medicine? They need to know if you have any of these conditions:  anemia not caused by low iron levels  heart disease  high levels of iron in the blood  kidney disease  liver disease  an unusual or allergic reaction to iron, other medicines, foods, dyes, or preservatives  pregnant or trying to get pregnant  breast-feeding How should I use this medicine? This medicine is for infusion into a vein. It is given by a health care professional in a hospital or clinic setting. Talk to your pediatrician regarding the use of this medicine in children. While this drug may be prescribed for children as young as 2 years for selected conditions, precautions do apply. Overdosage: If you think you have taken too much of this medicine contact a poison control center or emergency room at once. NOTE: This medicine is only for you. Do not share this medicine with others. What if I miss a dose? It is important not to miss your dose. Call your doctor or health care professional if you are unable to keep an appointment. What may interact with this medicine? Do not take this medicine with any of the following medications:  deferoxamine  dimercaprol  other iron products This medicine may also interact with the following medications:  chloramphenicol  deferasirox This list may not describe all possible interactions. Give your health care provider a list of all the medicines, herbs, non-prescription drugs, or  dietary supplements you use. Also tell them if you smoke, drink alcohol, or use illegal drugs. Some items may interact with your medicine. What should I watch for while using this medicine? Visit your doctor or healthcare professional regularly. Tell your doctor or healthcare professional if your symptoms do not start to get better or if they get worse. You may need blood work done while you are taking this medicine. You may need to follow a special diet. Talk to your doctor. Foods that contain iron include: whole grains/cereals, dried fruits, beans, or peas, leafy green vegetables, and organ meats (liver, kidney). What side effects may I notice from receiving this medicine? Side effects that you should report to your doctor or health care professional as soon as possible:  allergic reactions like skin rash, itching or hives, swelling of the face, lips, or tongue  breathing problems  changes in blood pressure  cough  fast, irregular heartbeat  feeling faint or lightheaded, falls  fever or chills  flushing, sweating, or hot feelings  joint or muscle aches/pains  seizures  swelling of the ankles or feet  unusually weak or tired Side effects that usually do not require medical attention (report to your doctor or health care professional if they continue or are bothersome):  diarrhea  feeling achy  headache  irritation at site where injected  nausea, vomiting  stomach upset  tiredness This list may not describe all possible side effects. Call your doctor for medical advice about side effects. You may report side effects to FDA at 1-800-FDA-1088. Where should I keep   my medicine? This drug is given in a hospital or clinic and will not be stored at home. NOTE: This sheet is a summary. It may not cover all possible information. If you have questions about this medicine, talk to your doctor, pharmacist, or health care provider.  2021 Elsevier/Gold Standard (2011-08-09  17:14:35)  

## 2021-02-20 NOTE — Progress Notes (Signed)
Patient presents today for Venofer infusion.  Vital signs are WNL.  Patient has no new complaints since last visit.  Venofer infusion given today per MD orders.  Stable during infusion without adverse affects.  Vital signs stable.  No complaints at this time.  Discharge from clinic ambulatory in stable condition.  Alert and oriented X 3.  Follow up with [facility] as scheduled.

## 2021-02-21 ENCOUNTER — Encounter (HOSPITAL_COMMUNITY): Payer: Self-pay | Admitting: Internal Medicine

## 2021-02-21 DIAGNOSIS — Z944 Liver transplant status: Secondary | ICD-10-CM | POA: Diagnosis not present

## 2021-02-22 ENCOUNTER — Other Ambulatory Visit (INDEPENDENT_AMBULATORY_CARE_PROVIDER_SITE_OTHER): Payer: Self-pay | Admitting: *Deleted

## 2021-02-22 ENCOUNTER — Ambulatory Visit (HOSPITAL_COMMUNITY): Payer: Medicare Other

## 2021-02-23 ENCOUNTER — Ambulatory Visit (HOSPITAL_COMMUNITY): Payer: Medicare Other

## 2021-02-23 ENCOUNTER — Other Ambulatory Visit: Payer: Self-pay

## 2021-02-23 ENCOUNTER — Encounter (HOSPITAL_COMMUNITY): Payer: Self-pay

## 2021-02-23 ENCOUNTER — Inpatient Hospital Stay (HOSPITAL_COMMUNITY): Payer: Medicare Other

## 2021-02-23 VITALS — BP 122/62 | HR 67 | Temp 96.9°F | Resp 18

## 2021-02-23 DIAGNOSIS — D509 Iron deficiency anemia, unspecified: Secondary | ICD-10-CM | POA: Diagnosis not present

## 2021-02-23 DIAGNOSIS — D508 Other iron deficiency anemias: Secondary | ICD-10-CM

## 2021-02-23 DIAGNOSIS — Z1231 Encounter for screening mammogram for malignant neoplasm of breast: Secondary | ICD-10-CM | POA: Diagnosis not present

## 2021-02-23 MED ORDER — SODIUM CHLORIDE 0.9 % IV SOLN
200.0000 mg | Freq: Once | INTRAVENOUS | Status: AC
  Administered 2021-02-23: 200 mg via INTRAVENOUS
  Filled 2021-02-23: qty 200

## 2021-02-23 MED ORDER — SODIUM CHLORIDE 0.9 % IV SOLN: Freq: Once | INTRAVENOUS | Status: AC

## 2021-02-23 NOTE — Patient Instructions (Signed)
[facility] at [facility]  Discharge Instructions:   _______________________________________________________________  Thank you for choosing [facility] at [facility] to provide your oncology and hematology care.  To afford each patient quality time with our providers, please arrive at least 15 minutes before your scheduled appointment.  You need to re-schedule your appointment if you arrive 10 or more minutes late.  We strive to give you quality time with our providers, and arriving late affects you and other patients whose appointments are after yours.  Also, if you no show three or more times for appointments you may be dismissed from the clinic.  Again, thank you for choosing [facility] at [facility]. Our hope is that these requests will allow you access to exceptional care and in a timely manner. _______________________________________________________________  If you have questions after your visit, please contact our office at [phone] between the hours of 8:30 a.m. and 5:00 p.m. Voicemails left after 4:30 p.m. will not be returned until the following business day. _______________________________________________________________  For prescription refill requests, have your pharmacy contact our office. _______________________________________________________________  Recommendations made by the consultant and any test results will be sent to your referring physician. _______________________________________________________________ 

## 2021-02-23 NOTE — Progress Notes (Signed)
Patient presents today for Feraheme infusion. Vital signs are stable. Patient denies any changes since the last iron infusion. Patient denies any complaints today. MAR reviewed and updated.   Feraheme given today per MD orders. Tolerated infusion without adverse affects. Vital signs stable. No complaints at this time. Discharged from clinic ambulatory in stable condition. Alert and oriented x 3. F/U with [facility] as scheduled.  

## 2021-03-08 ENCOUNTER — Encounter: Payer: Self-pay | Admitting: Physician Assistant

## 2021-03-08 ENCOUNTER — Other Ambulatory Visit: Payer: Self-pay

## 2021-03-08 ENCOUNTER — Ambulatory Visit (INDEPENDENT_AMBULATORY_CARE_PROVIDER_SITE_OTHER): Payer: Medicare Other | Admitting: Physician Assistant

## 2021-03-08 DIAGNOSIS — C44529 Squamous cell carcinoma of skin of other part of trunk: Secondary | ICD-10-CM | POA: Diagnosis not present

## 2021-03-08 DIAGNOSIS — C4492 Squamous cell carcinoma of skin, unspecified: Secondary | ICD-10-CM

## 2021-03-08 NOTE — Patient Instructions (Signed)

## 2021-03-09 ENCOUNTER — Ambulatory Visit (INDEPENDENT_AMBULATORY_CARE_PROVIDER_SITE_OTHER): Payer: Medicare Other | Admitting: Physician Assistant

## 2021-03-09 DIAGNOSIS — Z5189 Encounter for other specified aftercare: Secondary | ICD-10-CM

## 2021-03-09 NOTE — Progress Notes (Signed)
Patient was seen by [facility], no sutures had popped. Wound looks good per [facility], compression bandage applied.

## 2021-03-10 ENCOUNTER — Other Ambulatory Visit: Payer: Self-pay

## 2021-03-10 ENCOUNTER — Emergency Department (HOSPITAL_COMMUNITY)
Admission: EM | Admit: 2021-03-10 | Discharge: 2021-03-11 | Disposition: A | Discharge status: Home / Self Care | Payer: Medicare Other | Attending: Emergency Medicine | Admitting: Emergency Medicine

## 2021-03-10 ENCOUNTER — Encounter (HOSPITAL_COMMUNITY): Payer: Self-pay

## 2021-03-10 ENCOUNTER — Emergency Department (HOSPITAL_COMMUNITY): Payer: Medicare Other

## 2021-03-10 DIAGNOSIS — Z79899 Other long term (current) drug therapy: Secondary | ICD-10-CM | POA: Diagnosis not present

## 2021-03-10 DIAGNOSIS — M19011 Primary osteoarthritis, right shoulder: Secondary | ICD-10-CM | POA: Insufficient documentation

## 2021-03-10 DIAGNOSIS — I10 Essential (primary) hypertension: Secondary | ICD-10-CM | POA: Insufficient documentation

## 2021-03-10 DIAGNOSIS — Z85038 Personal history of other malignant neoplasm of large intestine: Secondary | ICD-10-CM | POA: Diagnosis not present

## 2021-03-10 DIAGNOSIS — E039 Hypothyroidism, unspecified: Secondary | ICD-10-CM | POA: Diagnosis not present

## 2021-03-10 DIAGNOSIS — Z7982 Long term (current) use of aspirin: Secondary | ICD-10-CM | POA: Diagnosis not present

## 2021-03-10 DIAGNOSIS — Z85828 Personal history of other malignant neoplasm of skin: Secondary | ICD-10-CM | POA: Insufficient documentation

## 2021-03-10 DIAGNOSIS — M25511 Pain in right shoulder: Secondary | ICD-10-CM | POA: Diagnosis not present

## 2021-03-10 MED ORDER — HYDROCODONE-ACETAMINOPHEN 5-325 MG PO TABS
1.0000 | ORAL_TABLET | Freq: Once | ORAL | Status: AC
Start: 2021-03-10 — End: 2021-03-10
  Administered 2021-03-10: 1 via ORAL
  Filled 2021-03-10: qty 1

## 2021-03-10 MED ORDER — MELOXICAM 7.5 MG PO TABS: 7.5000 mg | ORAL_TABLET | Freq: Every day | ORAL | 0 refills | Status: DC

## 2021-03-10 NOTE — ED Provider Notes (Signed)
acute onset of right shoulder pain. [facility] EMERGENCY DEPARTMENT Provider Note   CSN: [id number] Arrival date & time: 03/10/21  2015     History Chief Complaint  Patient presents with  . shoulder dislocation    Cassidy Barber is a 70 y.o. female.  Patient presents patient presents to the emergency department with acute onset right shoulder pain.  She reports that she reached with her right arm and felt a pop, started suffering severe pain in the right shoulder and inability to move it.  No direct trauma.         Past Medical History:  Diagnosis Date  . Abdominal wall hernia    Incarcerated status post surgical repair 2019 - [facility]  . Anemia of chronic disease   . Atypical nevus 01/21/2018   atypical neoplasm- Left scalp-ant (txpbx + MOHS), atypical neoplasm- Left scalp post- (txpbx + MOHS)  . Basal cell carcinoma   . Colon cancer [facility])    Colon surgery 2005 and 2012  . History of pulmonary hypertension    Pre liver transplant  . History of seizures   . Hypertension   . Hypothyroidism   . Lynch syndrome   . Osteopenia   . Primary biliary cirrhosis (HCC)    Status post liver transplantation - follows at [facility]  . SCCA (squamous cell carcinoma) of skin 02/23/2020   Right Upper Chest(moderate) (MOH's)  . SCCA (squamous cell carcinoma) of skin 04/07/2020   Left Top Leg (Keratoacanthoma) treatment after biopsy  . SCCA (squamous cell carcinoma) of skin 04/07/2020   Left Foot Dorsal (in situ) treatment after biopsy  . Squamous cell carcinoma of skin 04/22/2018   KA-Right mid chest (txpbx), KA-left elbow crease (txpbx), insitu-Right mid chest inf. (exc)  . Squamous cell carcinoma of skin 05/20/2018   well diff-Left upper shin (txpbx), well diff-Right lower forearm (txpbx), well diff-Right upper shin (txpbx)  . Squamous cell carcinoma of skin 06/11/2018   Scc + margin-Right mid chest inferior   . Squamous cell carcinoma of skin 06/27/2018   well diff-Left mid  thigh(txpbx), well diff-Left inner thigh (txpbx),insitu-Right cheek (txpbx),well diff-right inner heel (txpbx)  . Squamous cell carcinoma of skin 09/16/2018   well diff-left shoulder (txpbx), well diff-Right chin (txpbx), well diff-right chest lateral (CX35FU)  . Squamous cell carcinoma of skin 10/01/2018   Right outer lower shin (Txpbx)  . Squamous cell carcinoma of skin 04/03/2019   well diff-Right center chest (MOHS), in situ-Right ear  . Squamous cell carcinoma of skin 08/05/2019   in situ-Left calf (txpbx), in situ-left bicep (txpbx), well diff-Left chest,inf(txpbx), in situ-Right chest inf-(txpbx)  . Squamous cell carcinoma of skin 11/19/2019   KA-left top leg (txpbx), modify-Riight forehead-(mohs), in situ-right hand (txpbx), in situ-Right forearm (txpbx), well diff-Right chest (txpbx), well diff-chin (txpbx)  . Squamous cell carcinoma of skin 01/06/2020   KA- Left top leg  . Squamous cell carcinoma of skin 05/12/2013   bowens-middle of chest (CX35FU)  . Squamous cell carcinoma of skin 05/18/2015   well diff-Left upper arm (CX35FU + Exc),KA-right chest(txpbx), in situ-Left shin (txpbx), well diff-Right cheek (CX35FU), KA-Left post scalp (CX35FU)  . Squamous cell carcinoma of skin 08/09/2015   KA-Left post scalp ((MOHS), in situ- mid chest (Txpbx +exc), in situ-Left upper arm inferior (txpbx)  . Squamous cell carcinoma of skin 10/13/2015   Left upper arm-clear  . Squamous cell carcinoma of skin 03/09/2016   mod diff-mid chest (txpbx+ exc), mod diff-Right chest (txpbx+exc), well diff-right cheek-(txpbx),well diff-Left  hand-(txpbx), in situ-Left upper arm (txpbx), well diff-Right cheek -(txpbx), well diff-Right crease arm (txpbx)   . Squamous cell carcinoma of skin 05/24/2016   well diff-Right nasal crease-(MOHS)  . Squamous cell carcinoma of skin 08/02/2016   KA-Left chest med (txpbx)  . Squamous cell carcinoma of skin 08/30/2016   in situ-Left outer zygoma (txpbx)  . Squamous cell  carcinoma of skin 12/06/2016   well diff-Left chest sup, Left shoudler, insitu- right post scalp  . Squamous cell carcinoma of skin 02/14/2017   well diff-Left forearm (EXC),in situ-RIght ant neck  . Squamous cell carcinoma of skin 06/14/2017   in situ-Right forearm (txpbx), in situ-Right chest (txpbx), well diff-left chest (txpbx), well diff-anterior neck- (txpbx)  . Squamous cell carcinoma of skin 08/07/2017   well diff-Left upper shoulder (txpbx), sup and invasive-Left temple (txpbx), well diff-Right upper shin (txpbx), in situ-Right clavicle (txpbx)  . Squamous cell carcinoma of skin 10/17/2017   well diff-ant. neck (MOHS), in situ-Right chest, inf (txpbx)  . Squamous cell carcinoma of skin 01/21/2018   well diff- Right chest,ulnar (txpbx), well diff- right upper chest (txpbx), in situ-Right ant. crown (txpbx)  . Squamous cell carcinoma of skin 08/02/2020   well diff-left lower leg-inferior (Txpbx)  . Squamous cell carcinoma of skin 08/02/2020   well diff-right lower leg-mid (txpbx)  . Squamous cell carcinoma of skin 08/02/2020   well diff-left chest upper  . Squamous cell carcinoma of skin 08/02/2020   well diff-mid parietal scalp (MOHS)  . Squamous cell carcinoma of skin 08/02/2020   well diff-right foot inner(txpbx)  . Squamous cell carcinoma of skin 08/02/2020   well diff- left lower leg medial (txpbx)  . Squamous cell carcinoma of skin 08/02/2020   well diff-left lower leg anterior (txpbx)  . Squamous cell carcinoma of skin 08/02/2020   well diff-left lower leg medial (txpbx)  . Squamous cell carcinoma of skin 08/02/2020   well diff-right forearm-posterior (txpbx)    Patient Active Problem List   Diagnosis Date Noted  . Elevated alkaline phosphatase level 02/02/2021  . S/P reverse total shoulder arthroplasty, left 11/12/2019  . Anterior dislocation of left shoulder 09/26/2019  . Hx of colonic polyps 09/04/2019  . Chest pain 09/04/2019  . Benzodiazepine dependence ([place])  04/13/2019  . Controlled substance agreement signed 04/13/2019  . Urticaria 09/08/2018  . S/p reverse total shoulder arthroplasty 07/17/2018  . History of colon cancer 06/12/2018  . History of colonic polyps 06/12/2018  . 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  . Gastroesophageal reflux disease 06/12/2018  . Chronic diarrhea 06/26/2017  . Iron deficiency anemia 06/26/2017  . GAD (generalized anxiety disorder) 08/28/2016  . Vitamin D deficiency 08/28/2016  . Insomnia 08/28/2016  . Convulsions ([place]) 11/30/2015  . Liver transplant recipient [facility]) 11/30/2015  . Seizures ([place]) 09/27/2015  . Essential hypertension, benign 03/10/2015  . Hx of liver transplant ([place]) 12/08/2014  . Acute diarrhea 12/08/2014  . Colon cancer ([place]) 12/08/2014  . Hepatic encephalopathy ([place]) 10/26/2013  . Anemia of chronic disease 10/26/2013  . Personal history of colonic polyps 12/27/2012  . Abdominal pain 12/23/2012  . PBC (primary biliary cirrhosis) 03/24/2012  . Hypothyroidism 03/24/2012  . Ventral hernia, recurrent 03/24/2012    Past Surgical History:  Procedure Laterality Date  . ABDOMINAL HERNIA REPAIR     Patient's states that she has had 8- 9 hernia surgeries  . ABDOMINAL HYSTERECTOMY    . BIOPSY  02/15/2021   Procedure: BIOPSY;  Surgeon: Rogene Houston, MD;  Location: [facility] ENDO SU[facility];  Service: Endoscopy;;  . CATARACT EXTRACTION W/PHACO Right 01/15/2020   Procedure: CATARACT EXTRACTION PHACO AND INTRAOCULAR LENS PLACEMENT (IOC);  Surgeon: Baruch Goldmann, MD;  Location: [facility] ORS;  Service: Ophthalmology;  Laterality: Right;  CDE: 7.89  . CATARACT EXTRACTION W/PHACO Left 01/29/2020   Procedure: CATARACT EXTRACTION PHACO AND INTRAOCULAR LENS PLACEMENT (IOC) (CDE: 6.33);  Surgeon: Baruch Goldmann, MD;  Location: [facility] ORS;  Service: Ophthalmology;  Laterality: Left;  . CHOLECYSTECTOMY  2007  . COLON RESECTION    . COLON SURGERY  2008   Done at [facility]  . COLONOSCOPY     Done at [facility]  . ESOPHAGOGASTRODUODENOSCOPY N/A  08/21/2018   Procedure: ESOPHAGOGASTRODUODENOSCOPY (EGD);  Surgeon: Rogene Houston, MD;  Location: [facility] END[facility];  Service: Endoscopy;  Laterality: N/A;  . ESOPHAGOGASTRODUODENOSCOPY (EGD) WITH PROPOFOL N/A 12/16/2019   Procedure: ESOPHAGOGASTRODUODENOSCOPY (EGD) WITH PROPOFOL;  Surgeon: Rogene Houston, MD;  Location: [facility];  Service: Endoscopy;  Laterality: N/A;  . EYE SURGERY     lasix  . FLEXIBLE SIGMOIDOSCOPY N/A 10/20/2015   Procedure: FLEXIBLE SIGMOIDOSCOPY;  Surgeon: Rogene Houston, MD;  Location: [facility];  Service: Endoscopy;  Laterality: N/A;  36 - Dr Laural Golden has meeting until 1:00  . FLEXIBLE SIGMOIDOSCOPY N/A 07/11/2016   Procedure: FLEXIBLE SIGMOIDOSCOPY;  Surgeon: Rogene Houston, MD;  Location: [facility];  Service: Endoscopy;  Laterality: N/A;  1200  . FLEXIBLE SIGMOIDOSCOPY N/A 08/09/2017   Procedure: FLEXIBLE SIGMOIDOSCOPY;  Surgeon: Rogene Houston, MD;  Location[facility];  Service: Endoscopy;  Laterality: N/A;  1:00  . FLEXIBLE SIGMOIDOSCOPY N/A 08/21/2018   Procedure: FLEXIBLE SIGMOIDOSCOPY;  Surgeon: Rogene Houston, MD;  Location[facility];  Service: Endoscopy;  Laterality: N/A;  . FLEXIBLE SIGMOIDOSCOPY N/A 12/16/2019   Procedure: FLEXIBLE SIGMOIDOSCOPY wirh Propofol;  Surgeon: Rogene Houston, MD;  Location[facility];  Service: Endoscopy;  Laterality: N/A;  7:30  . FLEXIBLE SIGMOIDOSCOPY N/A 02/15/2021   Procedure: FLEXIBLE SIGMOIDOSCOPY WITH PROPOFOL;  Surgeon: Rogene Houston, MD;  Location: [facility];  Service: Endoscopy;  Laterality: N/A;  am  . FRACTURE SURGERY     right wrist metal plate  . HERNIA REPAIR    . LIVER TRANSPLANT  [id number]  . multiple skin cancers removed    . POLYPECTOMY  08/09/2017   Procedure: POLYPECTOMY;  Surgeon: Rogene Houston, MD;  Location: [facility];  Service: Endoscopy;;  colon small bowel  . REVERSE SHOULDER ARTHROPLASTY Left 07/17/2018  . REVERSE SHOULDER ARTHROPLASTY Left 07/17/2018    Procedure: LEFT REVERSE SHOULDER ARTHROPLASTY;  Surgeon: Justice Britain, MD;  Location: [place];  Service: Orthopedics;  Laterality: Left;  168mn  . SHOULDER CLOSED REDUCTION Left 09/27/2019   Procedure: CLOSED REDUCTION SHOULDER;  Surgeon: OParalee Cancel MD;  Location: [facility];  Service: Orthopedics;  Laterality: Left;  . SPLENECTOMY  2006  . TOTAL SHOULDER REVISION Left 11/12/2019   Procedure: Revision Left Reverse Shoulder Arthroplasty with poly exchange SDD;  Surgeon: SJustice Britain MD;  Location: [facility];  Service: Orthopedics;  Laterality: Left;  1246m -SDDC  . TYMPANOSTOMY TUBE PLACEMENT    . UPPER GASTROINTESTINAL ENDOSCOPY     Done at [facility]     OB History   No obstetric history on file.     Family History  Problem Relation Age of Onset  . Prostate cancer Father   . Colon cancer Father   . Colon cancer Sister   . Lung cancer Sister   .  Healthy Son   . Alcohol abuse Brother   . Allergic rhinitis Neg Hx   . Asthma Neg Hx   . Eczema Neg Hx   . Urticaria Neg Hx     Social History   Tobacco Use  . Smoking status: Never Smoker  . Smokeless tobacco: Never Used  Vaping Use  . Vaping Use: Never used  Substance Use Topics  . Alcohol use: No    Alcohol/week: 0.0 standard drinks  . Drug use: No    Home Medications Prior to Admission medications   Medication Sig Start Date End Date Taking? Authorizing Provider  acetaminophen (TYLENOL) 500 MG tablet Take 1,000 mg by mouth every 6 (six) hours as needed (for pain.).     [provider]  alendronate (FOSAMAX) 70 MG tablet TAKE 1 TABLET WEEKLY (TAKE WITH 8OZ OF WATER 30 MINUTES BEFORE BREAKFAST) Patient taking differently: Take 70 mg by mouth every Thursday. (TAKE WITH 8OZ OF WATER 30 MINUTES BEFORE BREAKFAST) 01/06/21   [name], FNP  ALPRAZolam Duanne Moron) 0.5 MG tablet Take 1 tablet (0.5 mg total) by mouth at bedtime. 01/06/21   [name] A, FNP  aspirin EC 81 MG tablet Take 81 mg by mouth daily.    [provider]  Calcium Citrate-Vitamin D (CALCIUM CITRATE + D3 PO) Take 600 mg by mouth 2 (two) times daily.    [provider]  cetirizine (ZYRTEC) 10 MG tablet TAKE 1 TO 2 TABLETS TWICE DAILY Patient taking differently: Take 10-20 mg by mouth daily. 12/02/19   [name], FNP  clotrimazole-betamethasone (LOTRISONE) cream Apply 1 application topically 2 (two) times daily. Patient taking differently: Apply 1 application topically daily as needed (Rash). 10/10/20   Chevis Pretty, FNP  Cyanocobalamin (VITAMIN B-12) 5000 MCG TBDP Take 5,000 mcg by mouth every Monday, Wednesday, and Friday.     [provider]  diphenoxylate-atropine (LOMOTIL) 2.5-0.025 MG tablet Take 1 tablet by mouth 3 (three) times daily as needed for diarrhea or loose stools. 02/02/21   Rogene Houston, MD  famotidine (PEPCID) 20 MG tablet Take 1 tablet (20 mg total) by mouth 2 (two) times daily. (Needs to be seen before next refill) Patient taking differently: Take 20 mg by mouth 2 (two) times daily. 01/06/21   [name] A, FNP  fluorouracil (EFUDEX) 5 % cream Apply 1 application topically daily as needed (cancer spots).  06/11/18   [provider]  gabapentin (NEURONTIN) 100 MG capsule Take 1 capsule (100 mg total) by mouth 3 (three) times daily. 01/06/21   [name], FNP  levothyroxine (SYNTHROID) 112 MCG tablet Take 1 tablet (112 mcg total) by mouth daily. 01/06/21 01/06/22  [name], FNP  losartan (COZAAR) 25 MG tablet TAKE ONE (1) TABLET EACH DAY Patient taking differently: Take 25 mg by mouth daily. 01/06/21   [name] A, FNP  metoprolol succinate (TOPROL XL) 25 MG 24 hr tablet Take 1 tablet (25 mg total) by mouth daily. 01/06/21 01/06/22  [name], FNP  mupirocin ointment (BACTROBAN) 2 % Apply 1 application topically daily as needed (Cancer spots). 09/12/20   [provider]  NIACINAMIDE-ZINC-FOLIC ACID PO Take 2 tablets by mouth 2 (two) times daily.     [provider]  pantoprazole (PROTONIX) 40 MG tablet Take 1 tablet (40 mg total) by mouth 2 (two) times daily. 01/06/21   [name] A, FNP  SSD 1 % cream Apply 1 application topically daily as needed (Radiation). 04/12/20  [provider]  SUPREP BOWEL PREP KIT 17.5-3.13-1.6 GM/177ML SOLN Take by mouth. 02/10/21   [provider]  ursodiol (ACTIGALL) 250 MG tablet Take 250 mg by mouth 2 (two) times daily.     [provider]  Vitamin D, Ergocalciferol, (DRISDOL) 1.25 MG (50000 UNIT) CAPS capsule TAKE 1 CAPSULE EVERY WEEK Patient taking differently: Take 50,000 Units by mouth every Thursday. 12/02/19   [name] A, FNP  vitamin E 180 MG (400 UNITS) capsule Take 400 Units by mouth daily.    [provider]  ZORTRESS 0.5 MG TABS Take 2 mg by mouth 2 (two) times daily.  12/12/18   [provider]    Allergies    Ciprofloxacin and Codeine  Review of Systems   Review of Systems  Musculoskeletal: Positive for arthralgias.  Neurological: Negative for weakness and numbness.    Physical Exam Updated Vital Signs BP (!) 162/83 (BP Location: Left Arm)   Pulse 73   Temp (!) 97.4 F (36.3 C) (Oral)   Resp 20   Ht 5' 4" (1.626 m)   Wt 64 kg   SpO2 100%   BMI 24.22 kg/m   Physical Exam Vitals and nursing note reviewed.  Constitutional:      Appearance: Normal appearance.  HENT:     Head: Normocephalic and atraumatic.  Pulmonary:     Effort: Pulmonary effort is normal.  Musculoskeletal:     Right shoulder: Tenderness present. No deformity. Decreased range of motion.     Right elbow: Normal.     Right wrist: Normal.  Neurological:     Mental Status: She is alert.     ED Results / Procedures / Treatments   Labs (all labs ordered are listed, but only abnormal results are displayed) Labs Reviewed - No data to display  EKG None  Radiology DG Shoulder Right  Result Date: 03/10/2021 CLINICAL DATA:  Atraumatic right  shoulder pain. EXAM: RIGHT SHOULDER - 2+ VIEW COMPARISON:  None. FINDINGS: Limited study secondary to difficult patient positioning. There is no evidence of fracture or dislocation. Mild degenerative changes are seen involving the right acromioclavicular joint. Soft tissues are unremarkable. IMPRESSION: Degenerative changes, as described above, without an acute osseous abnormality Electronically Signed   By: Virgina Norfolk M.D.   On: 03/10/2021 22:15    Procedures Procedures   Medications Ordered in ED Medications  HYDROcodone-acetaminophen (NORCO/VICODIN) 5-325 MG per tablet 1 tablet (has no administration in time range)    ED Course  I have reviewed the triage vital signs and the nursing notes.  Pertinent labs & imaging results that were available during my care of the patient were reviewed by me and considered in my medical decision making (see chart for details).    MDM Rules/Calculators/A&P                          Patient presents to the emergency department for evaluation of sudden onset right shoulder pain after reaching for an object.  X-ray shows degenerative changes but no acute fracture or dislocation.  She has anterior soft tissue tenderness on exam but no deformity.  Will sling for comfort.  She has a orthopedic doctor that she will follow-up with.  Provided analgesia.  Final Clinical Impression(s) / ED Diagnoses Final diagnoses:  Primary osteoarthritis of right shoulder    Rx / DC Orders ED Discharge Orders    None       Chaim Gatley, Gwenyth Allegra, MD  03/10/21 2322

## 2021-03-10 NOTE — ED Notes (Signed)
EDP present in Triage. MSE waiver not signed at this time.

## 2021-03-10 NOTE — ED Notes (Signed)
Patient calling out stating that she wants pain meds. Patient wanting to know if spouse can come to room. Patient assisted to get in bed.

## 2021-03-10 NOTE — ED Triage Notes (Signed)
Pt arrived via POV c/o right shoulder dislocation. Pt reports extending right arm to pay the bill at [facility] in [place] when her shoulder slipped out. Pt denies falling or any injury.

## 2021-03-10 NOTE — ED Provider Notes (Addendum)
MSE was initiated and I personally evaluated the patient and placed orders (if any) at  8:57 PM on March 10, 2021.  The patient appears stable so that the remainder of the MSE may be completed by another provider.   Chief Complaint:  Shoulder dislocation on right   HPI:   Patient states that she was just walking and her shoulder dislocated, atraumatic.  Has a history of this on the other side.   ROS:  Shoulder pain.   Physical Exam:  Gen:                Awake, no distress  HEENT:          Atraumatic  Resp:              Normal effort  Cardiac:          Normal rate  Abd:                Nondistended, nontender  MSK:               Right shoulder with obvious deformity.  Able to move all of her digits.  Radial pulse 2+. Neuro:            Speech clear,EOMS intact      Initiation of care has begun. The patient has been counseled on the process, plan, and necessity for staying for the completion/evaluation, and the remainder of the medical screening examination     Alfredia Client, Hershal Coria 03/10/21 2059    Milton Ferguson, MD 03/11/21 1439

## 2021-03-13 DIAGNOSIS — Z944 Liver transplant status: Secondary | ICD-10-CM | POA: Diagnosis not present

## 2021-03-14 ENCOUNTER — Encounter: Payer: Medicare Other | Admitting: Physician Assistant

## 2021-03-16 NOTE — Progress Notes (Signed)
Path to patient.  Please schedule Mohs on the lesion on the right side of the chest that has positive margins.

## 2021-03-21 ENCOUNTER — Ambulatory Visit (INDEPENDENT_AMBULATORY_CARE_PROVIDER_SITE_OTHER): Payer: Medicare Other

## 2021-03-21 ENCOUNTER — Ambulatory Visit: Payer: Medicare Other | Admitting: Physician Assistant

## 2021-03-21 ENCOUNTER — Other Ambulatory Visit: Payer: Self-pay

## 2021-03-21 DIAGNOSIS — Z4802 Encounter for removal of sutures: Secondary | ICD-10-CM

## 2021-03-21 NOTE — Progress Notes (Signed)
NTS Suture removal, No s/s of infection, path to pt. 

## 2021-03-27 ENCOUNTER — Other Ambulatory Visit: Payer: Self-pay

## 2021-03-27 ENCOUNTER — Encounter: Payer: Self-pay | Admitting: Physician Assistant

## 2021-03-27 ENCOUNTER — Ambulatory Visit (INDEPENDENT_AMBULATORY_CARE_PROVIDER_SITE_OTHER): Payer: Medicare Other | Admitting: Physician Assistant

## 2021-03-27 DIAGNOSIS — C44529 Squamous cell carcinoma of skin of other part of trunk: Secondary | ICD-10-CM

## 2021-03-27 DIAGNOSIS — L570 Actinic keratosis: Secondary | ICD-10-CM | POA: Diagnosis not present

## 2021-03-27 DIAGNOSIS — M25511 Pain in right shoulder: Secondary | ICD-10-CM | POA: Diagnosis not present

## 2021-03-27 DIAGNOSIS — Z1283 Encounter for screening for malignant neoplasm of skin: Secondary | ICD-10-CM

## 2021-03-27 DIAGNOSIS — D485 Neoplasm of uncertain behavior of skin: Secondary | ICD-10-CM

## 2021-03-27 NOTE — Progress Notes (Signed)
Follow-Up Visit   Subjective  Cassidy Barber is a 70 y.o. female who presents for the following: Procedure (Patient here today for surgery on chest. Patient has a personal history of SCC).   The following portions of the chart were reviewed this encounter and updated as appropriate:      Objective  Well appearing patient in no apparent distress; mood and affect are within normal limits.  A focused examination was performed including chest. Relevant physical exam findings are noted in the Assessment and Plan.   Assessment & Plan  Squamous cell carcinoma of skin (2) Left Upper Chest  Skin excision  Total excision diameter (cm):  6.5 Informed consent: discussed and consent obtained   Timeout: patient name, date of birth, surgical site, and procedure verified   Anesthesia: the lesion was anesthetized in a standard fashion   Anesthetic:  1% lidocaine w/ epinephrine 1-100,000 local infiltration Instrument used: #15 blade   Hemostasis achieved with: pressure and electrodesiccation   Outcome: patient tolerated procedure well with no complications   Post-procedure details: sterile dressing applied and wound care instructions given   Dressing type: bandage, petrolatum and pressure dressing    Skin repair Complexity:  Intermediate Final length (cm):  6.5 Informed consent: discussed and consent obtained   Timeout: patient name, date of birth, surgical site, and procedure verified   Procedure prep:  Patient was prepped and draped in usual sterile fashion Prep type:  Chlorhexidine Anesthesia: the lesion was anesthetized in a standard fashion   Reason for type of repair: reduce tension to allow closure, reduce the risk of dehiscence, infection, and necrosis, reduce subcutaneous dead space and avoid a hematoma, allow closure of the large defect, preserve normal anatomy, preserve normal anatomical and functional relationships and enhance both functionality and cosmetic results    Undermining: edges undermined   Subcutaneous layers (deep stitches):  Suture size:  3-0 Suture type: Vicryl (polyglactin 910)   Stitches:  Buried vertical mattress Fine/surface layer approximation (top stitches):  Suture size:  4-0 Suture type: Prolene (polypropylene)   Suture type comment:  Nylon Stitches: simple interrupted   Suture removal (days):  14 Hemostasis achieved with: suture Outcome: patient tolerated procedure well with no complications   Post-procedure details: sterile dressing applied and wound care instructions given   Post-procedure details comment:  Pressure bandage applied Dressing type: bandage and petrolatum   Additional details:  3-0 Vicryl x 5 4-0 Ethilon x 9  Specimen 1 - Surgical pathology Differential Diagnosis: R/O [facility] [id number] Excision Check Margins: Yes, superior margin stained  Right Upper Chest  Skin excision  Informed consent: discussed and consent obtained   Timeout: patient name, date of birth, surgical site, and procedure verified   Anesthesia: the lesion was anesthetized in a standard fashion   Anesthetic:  1% lidocaine w/ epinephrine 1-100,000 local infiltration Instrument used: #15 blade   Hemostasis achieved with: pressure and electrodesiccation   Outcome: patient tolerated procedure well with no complications   Post-procedure details: sterile dressing applied and wound care instructions given   Dressing type: bandage and petrolatum    Skin repair Complexity:  Intermediate Final length (cm):  5 Informed consent: discussed and consent obtained   Timeout: patient name, date of birth, surgical site, and procedure verified   Procedure prep:  Patient was prepped and draped in usual sterile fashion Prep type:  Chlorhexidine Anesthesia: the lesion was anesthetized in a standard fashion   Reason for type of repair: reduce tension to allow closure, reduce  the risk of dehiscence, infection, and necrosis, reduce subcutaneous dead space  and avoid a hematoma, allow closure of the large defect, preserve normal anatomy, preserve normal anatomical and functional relationships and enhance both functionality and cosmetic results   Undermining: edges undermined   Subcutaneous layers (deep stitches):  Suture size:  3-0 Suture type: Vicryl (polyglactin 910)   Stitches:  Buried vertical mattress Fine/surface layer approximation (top stitches):  Suture size:  4-0 Suture type: Prolene (polypropylene)   Suture type comment:  Nylon Stitches: running subcuticular and simple interrupted   Suture removal (days):  14 Hemostasis achieved with: suture Outcome: patient tolerated procedure well with no complications   Post-procedure details: sterile dressing applied and wound care instructions given   Dressing type: bandage and petrolatum   Additional details:  3-0 Vicryl x 5 4-0 Ethilon x 1 running suture  Specimen 2 - Surgical pathology Differential Diagnosis: R/O SCC [id number] Excision Check Margins: Yes, inferior margin stained    I, Genecis Veley, PA-C, have reviewed all documentation's for this visit.  The documentation on 03/27/21 for the exam, diagnosis, procedures and orders are all accurate and complete.

## 2021-03-27 NOTE — Patient Instructions (Signed)

## 2021-03-27 NOTE — Progress Notes (Addendum)
   Isotretinoin Follow-Up Visit   Subjective  Cassidy Barber is a 70 y.o. female who presents for the following: Follow-up (Patient here today for 2 week follow up. Per patient she has two lesions that are painful when touched. Per patient she just noticed the lesions.).  The following portions of the chart were reviewed this encounter and updated as appropriate:  Tobacco  Allergies  Meds  Problems  Med Hx  Surg Hx  Fam Hx        Review of Systems: No other skin or systemic complaints.   Objective  Well appearing patient in no apparent distress; mood and affect are within normal limits.  All skin waist up examined.  Objective  Mid Frontal Scalp: Erythematous patches with gritty scale.  Objective  waist up: Dyspigmented scar. No atypical nevi   Objective  Right Upper Back: Hyperkeratotic scale with pink base        Assessment & Plan  AK (actinic keratosis) Mid Frontal Scalp  Destruction of lesion - Mid Frontal Scalp Complexity: simple   Destruction method: cryotherapy   Informed consent: discussed and consent obtained   Timeout:  patient name, date of birth, surgical site, and procedure verified Lesion destroyed using liquid nitrogen: Yes   Cryotherapy cycles:  1 Outcome: patient tolerated procedure well with no complications   Post-procedure details: wound care instructions given    Encounter for screening for malignant neoplasm of skin waist up  Monthly exams  SCC (squamous cell carcinoma), trunk Right Upper Back  Skin excision  Total excision diameter (cm):  5.5 Informed consent: discussed and consent obtained   Timeout: patient name, date of birth, surgical site, and procedure verified   Anesthesia: the lesion was anesthetized in a standard fashion   Anesthetic:  1% lidocaine w/ epinephrine 1-100,000 local infiltration Instrument used: #15 blade   Hemostasis achieved with: pressure and electrodesiccation   Outcome: patient tolerated procedure  well with no complications   Post-procedure details: sterile dressing applied and wound care instructions given   Dressing type: bandage, petrolatum and pressure dressing    Skin repair Complexity:  Intermediate Final length (cm):  5.5 Informed consent: discussed and consent obtained   Timeout: patient name, date of birth, surgical site, and procedure verified   Procedure prep:  Patient was prepped and draped in usual sterile fashion (non sterile) Prep type:  Chlorhexidine Anesthesia: the lesion was anesthetized in a standard fashion   Reason for type of repair: reduce tension to allow closure and reduce the risk of dehiscence, infection, and necrosis   Undermining: edges undermined   Subcutaneous layers (deep stitches):  Suture size:  4-0 Suture type: Vicryl (polyglactin 910)   Fine/surface layer approximation (top stitches):  Suture size:  4-0 Suture type: nylon   Suture type comment:  Ethilon Stitches: running subcuticular, simple interrupted and simple running   Suture removal (days):  14 Hemostasis achieved with: suture, pressure and electrodesiccation Outcome: patient tolerated procedure well with no complications   Post-procedure details: sterile dressing applied and wound care instructions given   Post-procedure details comment:  Non sterile pressure  Dressing type: bandage and petrolatum   Additional details:  Vicryl x 4   Specimen 1 - Surgical pathology Differential Diagnosis: R/O BCC vs SCC  Check Margins: Yes - superior margin stained   I, Faustina Gebert, PA-C, have reviewed all documentation for this visit. The documentation on 03/31/21 for the exam, diagnosis, procedures, and orders are all accurate and complete.

## 2021-03-31 ENCOUNTER — Encounter: Payer: Self-pay | Admitting: Physician Assistant

## 2021-03-31 NOTE — Progress Notes (Signed)
   Follow-Up Visit   Subjective  Cassidy Barber is a 70 y.o. female who presents for the following: Wound Check (Patient here today for wound check on left upper chest. Per patient last night the wound was bleeding bad and she was concerned that the sutures had busted.).   The following portions of the chart were reviewed this encounter and updated as appropriate:  Tobacco  Allergies  Meds  Problems  Med Hx  Surg Hx  Fam Hx      Objective  Well appearing patient in no apparent distress; mood and affect are within normal limits.  A focused examination was performed including chest. Relevant physical exam findings are noted in the Assessment and Plan.  Objective  Left Upper Chest (2): Both wounds doing well. No bleeding   Assessment & Plan  Visit for wound check (2) Left Upper Chest  Compression bandage applied    I, Georgine Wiltse, PA-C, have reviewed all documentation's for this visit.  The documentation on 03/31/21 for the exam, diagnosis, procedures and orders are all accurate and complete.

## 2021-04-03 ENCOUNTER — Telehealth: Payer: Self-pay

## 2021-04-03 ENCOUNTER — Other Ambulatory Visit: Payer: Self-pay | Admitting: Family

## 2021-04-03 DIAGNOSIS — F1320 Sedative, hypnotic or anxiolytic dependence, uncomplicated: Secondary | ICD-10-CM

## 2021-04-03 DIAGNOSIS — Z79899 Other long term (current) drug therapy: Secondary | ICD-10-CM

## 2021-04-03 DIAGNOSIS — F411 Generalized anxiety disorder: Secondary | ICD-10-CM

## 2021-04-03 DIAGNOSIS — Z944 Liver transplant status: Secondary | ICD-10-CM | POA: Diagnosis not present

## 2021-04-03 NOTE — Telephone Encounter (Signed)
-----   Message from [name], [place] sent at 03/31/2021  2:30 PM EDT ----- Clear margins.

## 2021-04-03 NOTE — Telephone Encounter (Signed)
Phone call to patient with her pathology results. Patient aware of results.  

## 2021-04-11 ENCOUNTER — Telehealth: Payer: Self-pay

## 2021-04-11 DIAGNOSIS — C44529 Squamous cell carcinoma of skin of other part of trunk: Secondary | ICD-10-CM | POA: Diagnosis not present

## 2021-04-11 NOTE — Telephone Encounter (Signed)
[facility] is [facility] today she cx her nurse visit because they took out sutures. 1 month follow made for July an Aug.

## 2021-04-12 ENCOUNTER — Ambulatory Visit: Payer: Medicare Other

## 2021-04-12 DIAGNOSIS — Z944 Liver transplant status: Secondary | ICD-10-CM | POA: Diagnosis not present

## 2021-04-12 DIAGNOSIS — D849 Immunodeficiency, unspecified: Secondary | ICD-10-CM | POA: Diagnosis not present

## 2021-04-18 ENCOUNTER — Other Ambulatory Visit (INDEPENDENT_AMBULATORY_CARE_PROVIDER_SITE_OTHER): Payer: Self-pay | Admitting: Gastroenterology

## 2021-04-18 ENCOUNTER — Telehealth (INDEPENDENT_AMBULATORY_CARE_PROVIDER_SITE_OTHER): Payer: Self-pay | Admitting: Internal Medicine

## 2021-04-18 DIAGNOSIS — R197 Diarrhea, unspecified: Secondary | ICD-10-CM | POA: Diagnosis not present

## 2021-04-18 NOTE — Telephone Encounter (Signed)
Patient aware of all and will come by the office to get the lab orders and while here will go next door for the containers to collect samples in.

## 2021-04-18 NOTE — Telephone Encounter (Signed)
Will need to check stool testing prior to prescribing any new meds as she had the diarrhea recently. Will order tests, can you please send her the order? Thanks

## 2021-04-18 NOTE — Telephone Encounter (Signed)
Patient left voice mail message stating she has had diarrhea for several days - has been taking imodium but it's not helping - please advise - ph# [phone]

## 2021-04-18 NOTE — Telephone Encounter (Addendum)
Patient called back this am wanting to know what the results of her stool tests states. I advised the labs were not back as they were just done yesterday afternoon and it is now 11 am on 04/19/2021, that these test some times can take 2-3 days to come back. Patient then states "well what do they expect me to do die"? I advised if she thought her life was in jeopardy and if she felt that bad that maybe she needed to go to the Ed. Patient states she did not need to go to the Ed. I advised I would make the Dr aware she has called for the results and once the results were back we would let her know what the results were.   Patient came by the office yesterday 04/18/2021 afternoon and picked up the stools orders she took them next door and got the containers to collect the stool in.  I spoke with the patient today whom states she has had diarrhea on going for the last three days. She states she ran a fever on Sunday of 102.0,but has not ran a temp since then. I asked if she had been tested for Covid and she says no as she has no other symptoms that she would think be related to Covid. She has not seen any blood and has no stomach pain. She has been taking Imodium Q 3-4 hours, with having taken the last dose this am at 8 am, since that time she has had at least three loose bowel movements that she states is all water. She was last seen 02/02/2021 by Dr. Laural Golden and has a history of colon cancer. If any medication sent in she would like it to go to "[facility]", in [place], [place]. Please advise.

## 2021-04-19 NOTE — Telephone Encounter (Signed)
Did you see the note from today? The Larger paragraph? This was from today.

## 2021-04-19 NOTE — Telephone Encounter (Signed)
Yes, will need to rule out infections for now first.

## 2021-04-19 NOTE — Telephone Encounter (Signed)
Thanks

## 2021-04-20 ENCOUNTER — Other Ambulatory Visit: Payer: Self-pay | Admitting: Family

## 2021-04-20 DIAGNOSIS — L509 Urticaria, unspecified: Secondary | ICD-10-CM

## 2021-04-20 DIAGNOSIS — R197 Diarrhea, unspecified: Secondary | ICD-10-CM

## 2021-04-20 NOTE — Telephone Encounter (Signed)
Last office visit 01/20/21 Last refill 02/02/21, #90, no refills

## 2021-04-21 DIAGNOSIS — M25511 Pain in right shoulder: Secondary | ICD-10-CM | POA: Diagnosis not present

## 2021-04-21 DIAGNOSIS — Z944 Liver transplant status: Secondary | ICD-10-CM | POA: Diagnosis not present

## 2021-04-25 ENCOUNTER — Encounter: Payer: Medicare Other | Admitting: Physician Assistant

## 2021-04-25 LAB — GASTROINTESTINAL PATHOGEN PANEL PCR
C. difficile Tox A/B, PCR: NOT DETECTED
Campylobacter, PCR: NOT DETECTED
Cryptosporidium, PCR: NOT DETECTED
E coli (ETEC) LT/ST PCR: NOT DETECTED
E coli (STEC) stx1/stx2, PCR: NOT DETECTED
E coli 0157, PCR: NOT DETECTED
Giardia lamblia, PCR: NOT DETECTED
Norovirus, PCR: NOT DETECTED
Rotavirus A, PCR: NOT DETECTED
Salmonella, PCR: NOT DETECTED
Shigella, PCR: NOT DETECTED

## 2021-04-25 LAB — C. DIFFICILE GDH AND TOXIN A/B
GDH ANTIGEN: NOT DETECTED
MICRO NUMBER:: 11979717
SPECIMEN QUALITY:: ADEQUATE
TOXIN A AND B: NOT DETECTED

## 2021-04-25 LAB — OVA AND PARASITE EXAMINATION
CONCENTRATE RESULT:: NONE SEEN
MICRO NUMBER:: 11978920
SPECIMEN QUALITY:: ADEQUATE
TRICHROME RESULT:: NONE SEEN

## 2021-04-26 ENCOUNTER — Other Ambulatory Visit (INDEPENDENT_AMBULATORY_CARE_PROVIDER_SITE_OTHER): Payer: Self-pay | Admitting: Internal Medicine

## 2021-04-26 ENCOUNTER — Telehealth (INDEPENDENT_AMBULATORY_CARE_PROVIDER_SITE_OTHER): Payer: Self-pay

## 2021-04-26 MED ORDER — LOPERAMIDE HCL 2 MG PO CAPS: 4.0000 mg | ORAL_CAPSULE | Freq: Three times a day (TID) | ORAL | 0 refills | Status: DC | PRN

## 2021-04-26 NOTE — Telephone Encounter (Signed)
Patient called today to report that she had spoke with Dr. Laural Golden on 04/25/2021, and he wanted her to call back with what medication she had been taking for her diarrhea. She states she has been taking Lomotil 2 po QID.She states at the least she takes 6 total pills per day. She does not need any refills at this time.She has an upcoming appt on 05/09/2021 with Dr. Laural Golden

## 2021-04-26 NOTE — Telephone Encounter (Signed)
Patient called office and informed us that she had been taking Lomotil and Imodium. Since Lomotil is not working at high dose will switch her to loperamide 4 mg 3 times a day. She has office visit in 2 weeks.

## 2021-04-26 NOTE — Telephone Encounter (Signed)
Patient aware. Per Dr. Laural Golden stop Lomotil take Imodium 4 mg TID prn.Dr. Laural Golden sent medication to patient drug store.

## 2021-04-28 DIAGNOSIS — M25511 Pain in right shoulder: Secondary | ICD-10-CM | POA: Diagnosis not present

## 2021-04-28 DIAGNOSIS — M19011 Primary osteoarthritis, right shoulder: Secondary | ICD-10-CM | POA: Diagnosis not present

## 2021-05-03 ENCOUNTER — Other Ambulatory Visit (HOSPITAL_COMMUNITY): Payer: Self-pay

## 2021-05-03 DIAGNOSIS — D638 Anemia in other chronic diseases classified elsewhere: Secondary | ICD-10-CM

## 2021-05-04 ENCOUNTER — Inpatient Hospital Stay (HOSPITAL_COMMUNITY): Payer: Medicare Other | Attending: Hematology

## 2021-05-04 ENCOUNTER — Other Ambulatory Visit: Payer: Self-pay

## 2021-05-04 DIAGNOSIS — D509 Iron deficiency anemia, unspecified: Secondary | ICD-10-CM | POA: Insufficient documentation

## 2021-05-04 DIAGNOSIS — D638 Anemia in other chronic diseases classified elsewhere: Secondary | ICD-10-CM

## 2021-05-04 LAB — VITAMIN D 25 HYDROXY (VIT D DEFICIENCY, FRACTURES): Vit D, 25-Hydroxy: 94.43 ng/mL (ref 30–100)

## 2021-05-04 LAB — IRON AND TIBC
Iron: 27 ug/dL — ABNORMAL LOW (ref 28–170)
Saturation Ratios: 12 % (ref 10.4–31.8)
TIBC: 234 ug/dL — ABNORMAL LOW (ref 250–450)
UIBC: 207 ug/dL

## 2021-05-04 LAB — CBC WITH DIFFERENTIAL/PLATELET
Abs Immature Granulocytes: 0.01 10*3/uL (ref 0.00–0.07)
Basophils Absolute: 0.1 10*3/uL (ref 0.0–0.1)
Basophils Relative: 1 %
Eosinophils Absolute: 0.2 10*3/uL (ref 0.0–0.5)
Eosinophils Relative: 3 %
HCT: 34.6 % — ABNORMAL LOW (ref 36.0–46.0)
Hemoglobin: 10.4 g/dL — ABNORMAL LOW (ref 12.0–15.0)
Immature Granulocytes: 0 %
Lymphocytes Relative: 27 %
Lymphs Abs: 1.4 10*3/uL (ref 0.7–4.0)
MCH: 28.7 pg (ref 26.0–34.0)
MCHC: 30.1 g/dL (ref 30.0–36.0)
MCV: 95.6 fL (ref 80.0–100.0)
Monocytes Absolute: 1 10*3/uL (ref 0.1–1.0)
Monocytes Relative: 18 %
Neutro Abs: 2.7 10*3/uL (ref 1.7–7.7)
Neutrophils Relative %: 51 %
Platelets: 331 10*3/uL (ref 150–400)
RBC: 3.62 MIL/uL — ABNORMAL LOW (ref 3.87–5.11)
RDW: 15.2 % (ref 11.5–15.5)
WBC: 5.3 10*3/uL (ref 4.0–10.5)
nRBC: 0 % (ref 0.0–0.2)

## 2021-05-04 LAB — FOLATE: Folate: 26.8 ng/mL (ref >5.9)

## 2021-05-04 LAB — COMPREHENSIVE METABOLIC PANEL
ALT: 13 U/L (ref 0–44)
AST: 20 U/L (ref 15–41)
Albumin: 3.1 g/dL — ABNORMAL LOW (ref 3.5–5.0)
Alkaline Phosphatase: 111 U/L (ref 38–126)
Anion gap: 9 (ref 5–15)
BUN: 10 mg/dL (ref 8–23)
CO2: 25 mmol/L (ref 22–32)
Calcium: 8.6 mg/dL — ABNORMAL LOW (ref 8.9–10.3)
Chloride: 103 mmol/L (ref 98–111)
Creatinine, Ser: 0.81 mg/dL (ref 0.44–1.00)
GFR, Estimated: >60 mL/min (ref >60)
Glucose, Bld: 108 mg/dL — ABNORMAL HIGH (ref 70–99)
Potassium: 3.7 mmol/L (ref 3.5–5.1)
Sodium: 137 mmol/L (ref 135–145)
Total Bilirubin: 0.4 mg/dL (ref 0.3–1.2)
Total Protein: 6.7 g/dL (ref 6.5–8.1)

## 2021-05-04 LAB — LACTATE DEHYDROGENASE: LDH: 166 U/L (ref 98–192)

## 2021-05-04 LAB — VITAMIN B12: Vitamin B-12: 1510 pg/mL — ABNORMAL HIGH (ref 180–914)

## 2021-05-04 LAB — FERRITIN: Ferritin: 67 ng/mL (ref 11–307)

## 2021-05-09 ENCOUNTER — Ambulatory Visit (INDEPENDENT_AMBULATORY_CARE_PROVIDER_SITE_OTHER): Payer: Medicare Other | Admitting: Internal Medicine

## 2021-05-09 ENCOUNTER — Other Ambulatory Visit: Payer: Self-pay

## 2021-05-09 ENCOUNTER — Encounter (INDEPENDENT_AMBULATORY_CARE_PROVIDER_SITE_OTHER): Payer: Self-pay | Admitting: Internal Medicine

## 2021-05-09 VITALS — BP 115/75 | HR 76 | Temp 98.1°F | Ht 64.0 in | Wt 139.0 lb

## 2021-05-09 DIAGNOSIS — R197 Diarrhea, unspecified: Secondary | ICD-10-CM | POA: Diagnosis not present

## 2021-05-09 DIAGNOSIS — K219 Gastro-esophageal reflux disease without esophagitis: Secondary | ICD-10-CM | POA: Diagnosis not present

## 2021-05-09 DIAGNOSIS — L509 Urticaria, unspecified: Secondary | ICD-10-CM

## 2021-05-09 MED ORDER — METRONIDAZOLE 250 MG PO TABS: 250.0000 mg | ORAL_TABLET | Freq: Three times a day (TID) | ORAL | 0 refills | Status: DC

## 2021-05-09 NOTE — Patient Instructions (Signed)
Please call office with progress report next week.

## 2021-05-09 NOTE — Progress Notes (Signed)
Presenting complaint;  Follow for diarrhea.  Database and subjective:  Patient is 70 year old Caucasian female history of PVCs status post LDLT in January 2015, history of colon carcinoma status post subtotal colectomy as well as chronic GERD who called the office over 1 month ago with acute onset of diarrhea.  Stool studies obtained and these were negative.  She was on Lomotil which is not helping.  She was switched to loperamide.  She has chronic diarrhea secondary to loss of most of her colon but over the years she has gotten so much better that she was not even taking Lomotil.  Her baseline would be 3-4 bowel movements per day. Patient recall that she had fever only once and was 102 degrees. She is still does not feel well.  She has cramping as well as accidents.  She is having multiple bowel movements per day.  Stools are liquid.  This morning she already has had 4 bowel movements.  She says only in 1 day that she had 1 bowel movement since she has had diarrhea.  Her appetite is good but she is afraid to eat.  She has lost 4 pounds since we last saw her.  She is not experiencing nausea or vomiting. She is taking loperamide 4 mg 3 times a day on schedule. Heartburn is well controlled with therapy.   Current Medications: Outpatient Encounter Medications as of 05/09/2021  Medication Sig   acetaminophen (TYLENOL) 500 MG tablet Take 1,000 mg by mouth every 6 (six) hours as needed (for pain.).    alendronate (FOSAMAX) 70 MG tablet TAKE 1 TABLET WEEKLY (TAKE WITH 8OZ OF WATER 30 MINUTES BEFORE BREAKFAST) (Patient taking differently: Take 70 mg by mouth every Thursday. (TAKE WITH 8OZ OF WATER 30 MINUTES BEFORE BREAKFAST))   ALPRAZolam (XANAX) 0.5 MG tablet Take 1 tablet (0.5 mg total) by mouth at bedtime.   aspirin EC 81 MG tablet Take 81 mg by mouth daily.   Calcium Citrate-Vitamin D (CALCIUM CITRATE + D3 PO) Take 600 mg by mouth 2 (two) times daily.   cetirizine (ZYRTEC) 10 MG tablet TAKE 1 TO 2  TABLETS TWICE DAILY (Patient taking differently: Take 10-20 mg by mouth daily.)   clotrimazole-betamethasone (LOTRISONE) cream Apply 1 application topically 2 (two) times daily. (Patient taking differently: Apply 1 application topically daily as needed (Rash).)   Cyanocobalamin (VITAMIN B-12) 5000 MCG TBDP Take 5,000 mcg by mouth. Every Monday and Wednesday.   famotidine (PEPCID) 20 MG tablet TAKE ONE (1) TABLET BY MOUTH TWO (2) TIMES DAILY   fluorouracil (EFUDEX) 5 % cream Apply 1 application topically daily as needed (cancer spots).    gabapentin (NEURONTIN) 100 MG capsule Take 1 capsule (100 mg total) by mouth 3 (three) times daily.   levothyroxine (SYNTHROID) 112 MCG tablet Take 1 tablet (112 mcg total) by mouth daily.   loperamide (IMODIUM) 2 MG capsule Take 2 capsules (4 mg total) by mouth 3 (three) times daily as needed for diarrhea or loose stools.   losartan (COZAAR) 25 MG tablet TAKE ONE (1) TABLET EACH DAY (Patient taking differently: Take 25 mg by mouth daily.)   metoprolol succinate (TOPROL XL) 25 MG 24 hr tablet Take 1 tablet (25 mg total) by mouth daily.   mupirocin ointment (BACTROBAN) 2 % Apply 1 application topically daily as needed (Cancer spots).   NIACINAMIDE-ZINC-FOLIC ACID PO Take 2 tablets by mouth 2 (two) times daily.   pantoprazole (PROTONIX) 40 MG tablet Take 1 tablet (40 mg total) by mouth 2 (  two) times daily.   SSD 1 % cream Apply 1 application topically daily as needed (Radiation).   ursodiol (ACTIGALL) 250 MG tablet Take 250 mg by mouth 2 (two) times daily.    Vitamin D, Ergocalciferol, (DRISDOL) 1.25 MG (50000 UNIT) CAPS capsule TAKE 1 CAPSULE EVERY WEEK   vitamin E 180 MG (400 UNITS) capsule Take 400 Units by mouth daily.   ZORTRESS 0.5 MG TABS Take 2 mg by mouth 2 (two) times daily.    meloxicam (MOBIC) 7.5 MG tablet Take 1 tablet (7.5 mg total) by mouth daily. (Patient not taking: Reported on 05/09/2021)   No facility-administered encounter medications on file as  of 05/09/2021.     Objective: Blood pressure 115/75, pulse 76, temperature 98.1 F (36.7 C), temperature source Oral, height $RemoveBefo'5\' 4"'wHupplGxxPI$  (1.626 m), weight 139 lb (63 kg). Patient is alert and in no acute distress. She is wearing a mask. Conjunctiva is pink. Sclera is nonicteric Oropharyngeal mucosa is normal. No neck masses or thyromegaly noted. Cardiac exam with regular rhythm normal S1 and S2. No murmur or gallop noted. Lungs are clear to auscultation. Abdomen.  She has lower midline and horizontal scar in right mid abdomen.  Bowel sounds are hyperactive.  On palpation abdomen is soft and nontender with organomegaly or masses. No LE edema or clubbing noted.  Labs/studies Results:   CBC Latest Ref Rng & Units 05/04/2021 01/26/2021 10/18/2020  WBC 4.0 - 10.5 K/uL 5.3 8.6 7.7  Hemoglobin 12.0 - 15.0 g/dL 10.4(L) 10.5(L) 10.2(L)  Hematocrit 36.0 - 46.0 % 34.6(L) 35.3(L) 34.8(L)  Platelets 150 - 400 K/uL 331 505(H) 395    CMP Latest Ref Rng & Units 05/04/2021 01/26/2021 10/18/2020  Glucose 70 - 99 mg/dL 108(H) 108(H) 145(H)  BUN 8 - 23 mg/dL $Remove'10 15 22  'ThQTfxW$ Creatinine 0.44 - 1.00 mg/dL 0.81 0.98 0.92  Sodium 135 - 145 mmol/L 137 133(L) 137  Potassium 3.5 - 5.1 mmol/L 3.7 3.4(L) 4.3  Chloride 98 - 111 mmol/L 103 97(L) 102  CO2 22 - 32 mmol/L $RemoveB'25 27 27  'MzmKFJtX$ Calcium 8.9 - 10.3 mg/dL 8.6(L) 8.6(L) 8.3(L)  Total Protein 6.5 - 8.1 g/dL 6.7 6.9 6.6  Total Bilirubin 0.3 - 1.2 mg/dL 0.4 0.3 0.6  Alkaline Phos 38 - 126 U/L 111 202(H) 96  AST 15 - 41 U/L $Remo'20 20 18  'XTSBM$ ALT 0 - 44 U/L $Remo'13 14 13    'IYMTw$ Hepatic Function Latest Ref Rng & Units 05/04/2021 01/26/2021 10/18/2020  Total Protein 6.5 - 8.1 g/dL 6.7 6.9 6.6  Albumin 3.5 - 5.0 g/dL 3.1(L) 2.8(L) 3.1(L)  AST 15 - 41 U/L $Remo'20 20 18  'gRYVl$ ALT 0 - 44 U/L $Remo'13 14 13  'xzewz$ Alk Phosphatase 38 - 126 U/L 111 202(H) 96  Total Bilirubin 0.3 - 1.2 mg/dL 0.4 0.3 0.6  Bilirubin, Direct 0.00 - 0.40 mg/dL - - -    Lab Results  Component Value Date   CRP <0.8 08/19/2018       Assessment:  #1.  Acute diarrhea.  Stool studies are negative.  Partial response to antidiarrheals.  I suspect he had a GI infection.  We may be dealing with postinfectious diarrhea or she could also have small intestinal bacterial overgrowth as she has dilated small bowel.  We will treat her with metronidazole for 10 days and see how she does.  If she does not respond to metronidazole would consider flexible sigmoidoscopy and small bowel biopsy.  #2.  Gastroesophageal reflux disease.  She is doing well with  therapy.  #3.  History of colon carcinoma.  She carries gene for Lynch syndrome.  Most of her colon has been removed.  She opted not to have proctocolectomy.  She is up-to-date on CRC surveillance.  Last sigmoidoscopy was on 02/15/2021.  #4.  History of PBC.  Status post LDLT.  She is doing well.  She is being followed by Dr[facility] Laurier Nancy of [facility].  Plan:  Continue loperamide at a dose of 4 mg before each meal for now. Metronidazole 250 mg by mouth 3 times a day for 10 days. Patient will keep stool diary as to frequency and consistency bowel movements and call us with progress report when she finishes antibiotic. Office visit in 6 months.

## 2021-05-11 ENCOUNTER — Ambulatory Visit (HOSPITAL_COMMUNITY): Payer: Medicare Other | Admitting: Hematology and Oncology

## 2021-05-11 NOTE — Progress Notes (Signed)
[place], [place] [address]   CLINIC:  Medical Oncology/Hematology  PCP:  [name], [place] [address] [phone]   REASON FOR VISIT:  Follow-up for iron deficiency anemia  CURRENT THERAPY: Intermittent iron infusions (last infusion with Venofer 02/10/2021 through 02/23/2021)  INTERVAL HISTORY:  [name] 70 y.o. female returns for routine follow-up of her iron deficiency anemia.  She was last evaluated by NP Faythe Casa via telephone visit on 02/02/2021.  Her last iron infusion was 02/23/2021.  At today's visit, she reports feeling fair.  No recent hospitalizations, infections, surgeries, or changes in her baseline health status.  Patient reports chronic diarrhea secondary to her colectomy.  Because of this, she admits to avoiding meals from time to time because she knows it will "just make her go right afterwards."  She believes that this is the cause of her gradual weight loss, weight has gone down about 10 pounds over the past 2 years.  She reports that her stool is chronically dark, but she denies melena or hematochezia.  No epistaxis or other source of blood loss.  She is chronically fatigued, does not report any improvement in energy after receiving IV iron in the past.  She denies chest pain, shortness of breath on exertion, syncope, palpitations.  She does complain of some abdominal pain but is seeing her gastroenterologist Dr. Laural Golden for this.  She has 25% energy and 75% appetite. She endorses that she is maintaining a stable weight.    REVIEW OF SYSTEMS:  Review of Systems  Constitutional:  Positive for fatigue. Negative for appetite change, chills, diaphoresis, fever and unexpected weight change.  HENT:   Negative for lump/mass and nosebleeds.   Eyes:  Negative for eye problems.  Respiratory:  Negative for cough, hemoptysis and shortness of breath.   Cardiovascular:  Negative for chest pain,  leg swelling and palpitations.  Gastrointestinal:  Positive for abdominal pain and diarrhea. Negative for blood in stool, constipation, nausea and vomiting.  Genitourinary:  Negative for hematuria.   Skin: Negative.   Neurological:  Negative for dizziness, headaches and light-headedness.  Hematological:  Does not bruise/bleed easily.     PAST MEDICAL/SURGICAL HISTORY:  Past Medical History:  Diagnosis Date   Abdominal wall hernia    Incarcerated status post surgical repair 2019 - [facility]   Anemia of chronic disease    Atypical nevus 01/21/2018   atypical neoplasm- Left scalp-ant (txpbx + MOHS), atypical neoplasm- Left scalp post- (txpbx + MOHS)   Basal cell carcinoma    Colon cancer ([place])    Colon surgery 2005 and 2012   History of pulmonary hypertension    Pre liver transplant   History of seizures    Hypertension    Hypothyroidism    Lynch syndrome    Osteopenia    Primary biliary cirrhosis ([place])    Status post liver transplantation - follows at [facility]   SCCA (squamous cell carcinoma) of skin 02/23/2020   Right Upper Chest(moderate) (MOH's)   SCCA (squamous cell carcinoma) of skin 04/07/2020   Left Top Leg (Keratoacanthoma) treatment after biopsy   SCCA (squamous cell carcinoma) of skin 04/07/2020   Left Foot Dorsal (in situ) treatment after biopsy   SCCA (squamous cell carcinoma) of skin 03/27/2021   Right Upper Back (Keratoacanthoma) (excision) (clear)   Squamous cell carcinoma of skin 04/22/2018   KA-Right mid chest (txpbx), KA-left elbow crease (txpbx), insitu-Right mid chest inf. (exc)  Squamous cell carcinoma of skin 05/20/2018   well diff-Left upper shin (txpbx), well diff-Right lower forearm (txpbx), well diff-Right upper shin (txpbx)   Squamous cell carcinoma of skin 06/11/2018   Scc + margin-Right mid chest inferior    Squamous cell carcinoma of skin 06/27/2018   well diff-Left mid thigh(txpbx), well diff-Left inner thigh (txpbx),insitu-Right cheek (txpbx),well  diff-right inner heel (txpbx)   Squamous cell carcinoma of skin 09/16/2018   well diff-left shoulder (txpbx), well diff-Right chin (txpbx), well diff-right chest lateral (CX35FU)   Squamous cell carcinoma of skin 10/01/2018   Right outer lower shin (Txpbx)   Squamous cell carcinoma of skin 04/03/2019   well diff-Right center chest (MOHS), in situ-Right ear   Squamous cell carcinoma of skin 08/05/2019   in situ-Left calf (txpbx), in situ-left bicep (txpbx), well diff-Left chest,inf(txpbx), in situ-Right chest inf-(txpbx)   Squamous cell carcinoma of skin 11/19/2019   KA-left top leg (txpbx), modify-Riight forehead-(mohs), in situ-right hand (txpbx), in situ-Right forearm (txpbx), well diff-Right chest (txpbx), well diff-chin (txpbx)   Squamous cell carcinoma of skin 01/06/2020   KA- Left top leg   Squamous cell carcinoma of skin 05/12/2013   bowens-middle of chest (CX35FU)   Squamous cell carcinoma of skin 05/18/2015   well diff-Left upper arm (CX35FU + Exc),KA-right chest(txpbx), in situ-Left shin (txpbx), well diff-Right cheek (CX35FU), KA-Left post scalp (CX35FU)   Squamous cell carcinoma of skin 08/09/2015   KA-Left post scalp ((MOHS), in situ- mid chest (Txpbx +exc), in situ-Left upper arm inferior (txpbx)   Squamous cell carcinoma of skin 10/13/2015   Left upper arm-clear   Squamous cell carcinoma of skin 03/09/2016   mod diff-mid chest (txpbx+ exc), mod diff-Right chest (txpbx+exc), well diff-right cheek-(txpbx),well diff-Left hand-(txpbx), in situ-Left upper arm (txpbx), well diff-Right cheek -(txpbx), well diff-Right crease arm (txpbx)    Squamous cell carcinoma of skin 05/24/2016   well diff-Right nasal crease-(MOHS)   Squamous cell carcinoma of skin 08/02/2016   KA-Left chest med (txpbx)   Squamous cell carcinoma of skin 08/30/2016   in situ-Left outer zygoma (txpbx)   Squamous cell carcinoma of skin 12/06/2016   well diff-Left chest sup, Left shoudler, insitu- right post  scalp   Squamous cell carcinoma of skin 02/14/2017   well diff-Left forearm (EXC),in situ-RIght ant neck   Squamous cell carcinoma of skin 06/14/2017   in situ-Right forearm (txpbx), in situ-Right chest (txpbx), well diff-left chest (txpbx), well diff-anterior neck- (txpbx)   Squamous cell carcinoma of skin 08/07/2017   well diff-Left upper shoulder (txpbx), sup and invasive-Left temple (txpbx), well diff-Right upper shin (txpbx), in situ-Right clavicle (txpbx)   Squamous cell carcinoma of skin 10/17/2017   well diff-ant. neck (MOHS), in situ-Right chest, inf (txpbx)   Squamous cell carcinoma of skin 01/21/2018   well diff- Right chest,ulnar (txpbx), well diff- right upper chest (txpbx), in situ-Right ant. crown (txpbx)   Squamous cell carcinoma of skin 08/02/2020   well diff-left lower leg-inferior (Txpbx)   Squamous cell carcinoma of skin 08/02/2020   well diff-right lower leg-mid (txpbx)   Squamous cell carcinoma of skin 08/02/2020   well diff-left chest upper   Squamous cell carcinoma of skin 08/02/2020   well diff-mid parietal scalp (MOHS)   Squamous cell carcinoma of skin 08/02/2020   well diff-right foot inner(txpbx)   Squamous cell carcinoma of skin 08/02/2020   well diff- left lower leg medial (txpbx)   Squamous cell carcinoma of skin 08/02/2020   well diff-left lower leg anterior (  txpbx)   Squamous cell carcinoma of skin 08/02/2020   well diff-left lower leg medial (txpbx)   Squamous cell carcinoma of skin 08/02/2020   well diff-right forearm-posterior (txpbx)   Past Surgical History:  Procedure Laterality Date   ABDOMINAL HERNIA REPAIR     Patient's states that she has had 8- 9 hernia surgeries   ABDOMINAL HYSTERECTOMY     BIOPSY  02/15/2021   Procedure: BIOPSY;  Surgeon: Rogene Houston, MD;  [facility];  Service: Endoscopy;;   CATARACT EXTRACTION W/PHACO Right 01/15/2020   Procedure: CATARACT EXTRACTION PHACO AND INTRAOCULAR LENS PLACEMENT ([place]);  Surgeon:  Baruch Goldmann, MD;  Location: [facility];  Service: Ophthalmology;  Laterality: Right;  CDE: 7.89   CATARACT EXTRACTION W/PHACO Left 01/29/2020   Procedure: CATARACT EXTRACTION PHACO AND INTRAOCULAR LENS PLACEMENT ([facility]) (CDE: 6.33);  Surgeon: Baruch Goldmann, MD;  Location: [facility];  Service: Ophthalmology;  Laterality: Left;   CHOLECYSTECTOMY  2007   COLON RESECTION     COLON SURGERY  2008   Done at [facility]   COLONOSCOPY     Done at [facility]   ESOPHAGOGASTRODUODENOSCOPY N/A 08/21/2018   Procedure: ESOPHAGOGASTRODUODENOSCOPY (EGD);  Surgeon: Rogene Houston, MD;  [facility];  Service: Endoscopy;  Laterality: N/A;   ESOPHAGOGASTRODUODENOSCOPY (EGD) WITH PROPOFOL N/A 12/16/2019   Procedure: ESOPHAGOGASTRODUODENOSCOPY (EGD) WITH PROPOFOL;  Surgeon: Rogene Houston, MD;  [facility];  Service: Endoscopy;  Laterality: N/A;   EYE SURGERY     lasix   FLEXIBLE SIGMOIDOSCOPY N/A 10/20/2015   Procedure: FLEXIBLE SIGMOIDOSCOPY;  Surgeon: Rogene Houston, MD;  [facility];  Service: Endoscopy;  Laterality: N/A;  53 - Dr Laural Golden has meeting until 1:00   FLEXIBLE SIGMOIDOSCOPY N/A 07/11/2016   Procedure: FLEXIBLE SIGMOIDOSCOPY;  Surgeon: Rogene Houston, MD;  [facility];  Service: Endoscopy;  Laterality: N/A;  [place] N/A 08/09/2017   Procedure: FLEXIBLE SIGMOIDOSCOPY;  Surgeon: Rogene Houston, MD;  [facility];  Service: Endoscopy;  Laterality: N/A;  1:00   FLEXIBLE SIGMOIDOSCOPY N/A 08/21/2018   Procedure: FLEXIBLE SIGMOIDOSCOPY;  Surgeon: Rogene Houston, MD;  [facility];  Service: Endoscopy;  Laterality: N/A;   FLEXIBLE SIGMOIDOSCOPY N/A 12/16/2019   Procedure: FLEXIBLE SIGMOIDOSCOPY wirh Propofol;  Surgeon: Rogene Houston, MD;  [facility];  Service: Endoscopy;  Laterality: N/A;  7:30   FLEXIBLE SIGMOIDOSCOPY N/A 02/15/2021   Procedure: FLEXIBLE SIGMOIDOSCOPY WITH PROPOFOL;  Surgeon: Rogene Houston, MD;   [facility];  Service: Endoscopy;  Laterality: N/A;  am   FRACTURE SURGERY     right wrist metal plate   HERNIA REPAIR     LIVER TRANSPLANT  [id number]   multiple skin cancers removed     POLYPECTOMY  08/09/2017   Procedure: POLYPECTOMY;  Surgeon: Rogene Houston, MD;  [facility];  Service: Endoscopy;;  colon small bowel   REVERSE SHOULDER ARTHROPLASTY Left 07/17/2018   REVERSE SHOULDER ARTHROPLASTY Left 07/17/2018   Procedure: LEFT REVERSE SHOULDER ARTHROPLASTY;  Surgeon: Justice Britain, MD;  Location: [place];  Service: Orthopedics;  Laterality: Left;  140min   SHOULDER CLOSED REDUCTION Left 09/27/2019   Procedure: CLOSED REDUCTION SHOULDER;  Surgeon: Paralee Cancel, MD;  Location: [facility];  Service: Orthopedics;  Laterality: Left;   SPLENECTOMY  2006   TOTAL SHOULDER REVISION Left 11/12/2019   Procedure: Revision Left Reverse Shoulder Arthroplasty with poly exchange SDD;  Surgeon: Justice Britain, MD;  Location: [facility];  Service: Orthopedics;  Laterality: Left;  117min -SDDC   TYMPANOSTOMY TUBE PLACEMENT     UPPER GASTROINTESTINAL ENDOSCOPY     Done at [facility]     SOCIAL HISTORY:  Social History   Socioeconomic History   Marital status: Married    Spouse name: Johnny   Number of children: 1   Years of education: 12   Highest education level: High school graduate  Occupational History   Occupation: Disability    Employer: [employer]    Comment: Multimedia programmer  Tobacco Use   Smoking status: Never   Smokeless tobacco: Never  Vaping Use   Vaping Use: Never used  Substance and Sexual Activity   Alcohol use: No    Alcohol/week: 0.0 standard drinks   Drug use: No   Sexual activity: Yes    Birth control/protection: None  Other Topics Concern   Not on file  Social History Narrative   Patient lives in a two story home with her husband. She has an adult son. She is retired from being an Web designer for 30 years.    Social Determinants of  Health   Financial Resource Strain: Low Risk    Difficulty of Paying Living Expenses: Not hard at all  Food Insecurity: No Food Insecurity   Worried About Charity fundraiser in the Last Year: Never true   [place] in the Last Year: Never true  Transportation Needs: No Transportation Needs   Lack of Transportation (Medical): No   Lack of Transportation (Non-Medical): No  Physical Activity: Sufficiently Active   Days of Exercise per Week: 7 days   Minutes of Exercise per Session: 30 min  Stress: No Stress Concern Present   Feeling of Stress : Not at all  Social Connections: Socially Integrated   Frequency of Communication with Friends and Family: More than three times a week   Frequency of Social Gatherings with Friends and Family: More than three times a week   Attends Religious Services: More than 4 times per year   Active Member of [employer] or Organizations: Yes   Attends Music therapist: More than 4 times per year   Marital Status: Married  Human resources officer Violence: Not At Risk   Fear of Current or Ex-Partner: No   Emotionally Abused: No   Physically Abused: No   Sexually Abused: No    FAMILY HISTORY:  Family History  Problem Relation Age of Onset   Prostate cancer Father    Colon cancer Father    Colon cancer Sister    Lung cancer Sister    Healthy Son    Alcohol abuse Brother    Allergic rhinitis Neg Hx    Asthma Neg Hx    Eczema Neg Hx    Urticaria Neg Hx     CURRENT MEDICATIONS:  Outpatient Encounter Medications as of 05/12/2021  Medication Sig   acetaminophen (TYLENOL) 500 MG tablet Take 1,000 mg by mouth every 6 (six) hours as needed (for pain.).    alendronate (FOSAMAX) 70 MG tablet TAKE 1 TABLET WEEKLY (TAKE WITH 8OZ OF WATER 30 MINUTES BEFORE BREAKFAST) (Patient taking differently: Take 70 mg by mouth every Thursday. (TAKE WITH 8OZ OF WATER 30 MINUTES BEFORE BREAKFAST))   ALPRAZolam (XANAX) 0.5 MG tablet Take 1 tablet (0.5 mg total) by  mouth at bedtime.   aspirin EC 81 MG tablet Take 81 mg by mouth daily.   Calcium Citrate-Vitamin  D (CALCIUM CITRATE + D3 PO) Take 600 mg by mouth 2 (two) times daily.   cetirizine (ZYRTEC) 10 MG tablet TAKE 1 TO 2 TABLETS TWICE DAILY (Patient taking differently: Take 10-20 mg by mouth daily.)   clotrimazole-betamethasone (LOTRISONE) cream Apply 1 application topically 2 (two) times daily. (Patient taking differently: Apply 1 application topically daily as needed ([place]ash).)   Cyanocobalamin (VITAMIN B-12) 5000 MCG TBDP Take 5,000 mcg by mouth. Every Monday and Wednesday.   famotidine (PEPCID) 20 MG tablet Take 1 tablet (20 mg total) by mouth at bedtime as needed for heartburn or indigestion.   fluorouracil (EFUDEX) 5 % cream Apply 1 application topically daily as needed (cancer spots).    gabapentin (NEURONTIN) 100 MG capsule Take 1 capsule (100 mg total) by mouth 3 (three) times daily.   levothyroxine (SYNTHROID) 112 MCG tablet Take 1 tablet (112 mcg total) by mouth daily.   loperamide (IMODIUM) 2 MG capsule Take 2 capsules (4 mg total) by mouth 3 (three) times daily as needed for diarrhea or loose stools.   losartan (COZAAR) 25 MG tablet TAKE ONE (1) TABLET EACH DAY (Patient taking differently: Take 25 mg by mouth daily.)   metoprolol succinate (TOPROL XL) 25 MG 24 hr tablet Take 1 tablet (25 mg total) by mouth daily.   metroNIDAZOLE (FLAGYL) 250 MG tablet Take 1 tablet (250 mg total) by mouth 3 (three) times daily.   mupirocin ointment (BACTROBAN) 2 % Apply 1 application topically daily as needed (Cancer spots).   NIACINAMIDE-ZINC-FOLIC ACID PO Take 2 tablets by mouth 2 (two) times daily.   pantoprazole (PROTONIX) 40 MG tablet Take 1 tablet (40 mg total) by mouth 2 (two) times daily.   SSD 1 % cream Apply 1 application topically daily as needed (Radiation).   ursodiol (ACTIGALL) 250 MG tablet Take 250 mg by mouth 2 (two) times daily.    Vitamin D, Ergocalciferol, (DRISDOL) 1.25 MG (50000 UNIT)  CAPS capsule TAKE 1 CAPSULE EVERY WEEK   vitamin E 180 MG (400 UNITS) capsule Take 400 Units by mouth daily.   ZORTRESS 0.5 MG TABS Take 2 mg by mouth 2 (two) times daily.    No facility-administered encounter medications on file as of 05/12/2021.    ALLERGIES:  Allergies  Allergen Reactions   Ciprofloxacin Itching   Codeine Nausea Only     PHYSICAL EXAM:  ECOG PERFORMANCE STATUS: 0 - Asymptomatic  There were no vitals filed for this visit. There were no vitals filed for this visit. Physical Exam Constitutional:      Appearance: Normal appearance.  HENT:     Head: Normocephalic and atraumatic.     Mouth/Throat:     Mouth: Mucous membranes are moist.  Eyes:     Extraocular Movements: Extraocular movements intact.     Pupils: Pupils are equal, round, and reactive to light.  Cardiovascular:     Rate and Rhythm: Normal rate and regular rhythm.     Pulses: Normal pulses.     Heart sounds: Normal heart sounds.  Pulmonary:     Effort: Pulmonary effort is normal.     Breath sounds: Normal breath sounds.  Abdominal:     General: Bowel sounds are normal.     Palpations: Abdomen is soft.     Tenderness: There is no abdominal tenderness.  Musculoskeletal:        General: No swelling.     Right lower leg: No edema.     Left lower leg: No edema.  Lymphadenopathy:  Cervical: No cervical adenopathy.  Skin:    General: Skin is warm and dry.  Neurological:     General: No focal deficit present.     Mental Status: She is alert and oriented to person, place, and time.  Psychiatric:        Mood and Affect: Mood normal.        Behavior: Behavior normal.     LABORATORY DATA:  I have reviewed the labs as listed.  CBC    Component Value Date/Time   WBC 5.3 05/04/2021 0905   RBC 3.62 (L) 05/04/2021 0905   HGB 10.4 (L) 05/04/2021 0905   HGB 12.0 06/28/2020 1146   HCT 34.6 (L) 05/04/2021 0905   HCT 36.8 06/28/2020 1146   PLT 331 05/04/2021 0905   PLT 297 06/28/2020 1146    MCV 95.6 05/04/2021 0905   MCV 86 06/28/2020 1146   MCH 28.7 05/04/2021 0905   MCHC 30.1 05/04/2021 0905   RDW 15.2 05/04/2021 0905   RDW 12.3 06/28/2020 1146   LYMPHSABS 1.4 05/04/2021 0905   LYMPHSABS 1.9 06/28/2020 1146   MONOABS 1.0 05/04/2021 0905   EOSABS 0.2 05/04/2021 0905   EOSABS 0.7 (H) 06/28/2020 1146   BASOSABS 0.1 05/04/2021 0905   BASOSABS 0.2 06/28/2020 1146   CMP Latest Ref Rng & Units 05/04/2021 01/26/2021 10/18/2020  Glucose 70 - 99 mg/dL 108(H) 108(H) 145(H)  BUN 8 - 23 mg/dL 10 15 22   Creatinine 0.44 - 1.00 mg/dL 0.81 0.98 0.92  Sodium 135 - 145 mmol/L 137 133(L) 137  Potassium 3.5 - 5.1 mmol/L 3.7 3.4(L) 4.3  Chloride 98 - 111 mmol/L 103 97(L) 102  CO2 22 - 32 mmol/L 25 27 27   Calcium 8.9 - 10.3 mg/dL 8.6(L) 8.6(L) 8.3(L)  Total Protein 6.5 - 8.1 g/dL 6.7 6.9 6.6  Total Bilirubin 0.3 - 1.2 mg/dL 0.4 0.3 0.6  Alkaline Phos 38 - 126 U/L 111 202(H) 96  AST 15 - 41 U/L 20 20 18   ALT 0 - 44 U/L 13 14 13     DIAGNOSTIC IMAGING:  I have independently reviewed the relevant imaging and discussed with the patient.  ASSESSMENT & PLAN: 1.  Iron deficiency anemia: - Unable to tolerate oral iron. - Secondary to history of colon cancer s/p abdominal colectomy.  Malabsorption. - She had a sigmoidoscopy/EGD on 12/16/2019 which appeared normal.  Gastric antral vascular ectasia without bleeding.  Healed esophagitis. - Denies any current signs or symptoms of bleeding such as epistaxis, rectal hemorrhage, or melena. - SPEP negative in 2019 - She last received IV Venofer on 02/10/2021 through 02/23/2021 - Labs from 05/04/2021 show Hgb 10.4 with MCV 95.6, ferritin 67, iron saturation 12% - PLAN: IV Venofer x3 doses, RTC in 3 months for labs and follow-up   2.  Liver Transplant: -Had this completed at [facility] in January 2015.  -She is on immunosuppression with everolimus.   3.  Skin cancer on her scalp: -Secondary to chronic immunosuppression for liver transplant; lynch  syndrome -She is status post 30 treatments of radiation to her scalp. -She is followed by dermatology and has had several areas treated recently with topical creams.   4.  History of colon cancer: -Status post abdominal colectomy on 09/17/2011 with simultaneous total abdominal hysterectomy and bilateral salpingo-oophorectomy. -Found to have Lynch syndrome. -Per recommendations she is to have an EGD every 2 to 3 years and annual colonoscopy for review of her residual colon tissue at the anastomosis.    PLAN SUMMARY &  DISPOSITION: -Venofer x3 - Labs and RTC (phone visit) in 4 months  All questions were answered. The patient knows to call the clinic with any problems, questions or concerns.  Medical decision making: Low  Time spent on visit: I spent 15 minutes counseling the patient face to face. The total time spent in the appointment was 25 minutes and more than 50% was on counseling.   Harriett Rush, PA-C  05/12/2021 9:51 AM

## 2021-05-12 ENCOUNTER — Inpatient Hospital Stay (HOSPITAL_COMMUNITY): Payer: Medicare Other | Attending: Hematology and Oncology | Admitting: Physician Assistant

## 2021-05-12 ENCOUNTER — Other Ambulatory Visit: Payer: Self-pay

## 2021-05-12 ENCOUNTER — Encounter (HOSPITAL_COMMUNITY): Payer: Self-pay | Admitting: Physician Assistant

## 2021-05-12 VITALS — BP 126/62 | HR 77 | Resp 16 | Wt 139.0 lb

## 2021-05-12 DIAGNOSIS — D508 Other iron deficiency anemias: Secondary | ICD-10-CM | POA: Diagnosis not present

## 2021-05-12 DIAGNOSIS — D509 Iron deficiency anemia, unspecified: Secondary | ICD-10-CM | POA: Insufficient documentation

## 2021-05-12 DIAGNOSIS — Z944 Liver transplant status: Secondary | ICD-10-CM | POA: Diagnosis not present

## 2021-05-12 DIAGNOSIS — Z79899 Other long term (current) drug therapy: Secondary | ICD-10-CM | POA: Diagnosis not present

## 2021-05-12 DIAGNOSIS — Z85038 Personal history of other malignant neoplasm of large intestine: Secondary | ICD-10-CM | POA: Diagnosis not present

## 2021-05-12 NOTE — Patient Instructions (Addendum)
[place] at [facility] Discharge Instructions  You were seen today by Tarri Abernethy PA-C for your iron deficiency anemia.  Your blood levels remain mildly low but stable.  Your iron is borderline low.  We recommend treatment with IV iron x3 doses.  We will see you back in 4 months for repeat labs and follow-up visit.   Thank you for choosing [place] [facility] to provide your oncology and hematology care.  To afford each patient quality time with our provider, please arrive at least 15 minutes before your scheduled appointment time.   If you have a lab appointment with the [place] [facility] please come in thru the Main Entrance and check in at the main information desk.  You need to re-schedule your appointment should you arrive 10 or more minutes late.  We strive to give you quality time with our providers, and arriving late affects you and other patients whose appointments are after yours.  Also, if you no show three or more times for appointments you may be dismissed from the clinic at the providers discretion.     Again, thank you for choosing [facility].  Our hope is that these requests will decrease the amount of time that you wait before being seen by our physicians.       _____________________________________________________________  Should you have questions after your visit to [facility], please contact our office at [phone] and follow the prompts.  Our office hours are 8:00 a.m. and 4:30 p.m. Monday - Friday.  Please note that voicemails left after 4:00 p.m. may not be returned until the following business day.  We are closed weekends and major holidays.  You do have access to a nurse 24-7, just call the main number to the clinic [phone] and do not press any options, hold on the line and a nurse will answer the phone.    For prescription refill requests, have your pharmacy contact our office  and allow 72 hours.    Due to Covid, you will need to wear a mask upon entering the hospital. If you do not have a mask, a mask will be given to you at the Main Entrance upon arrival. For doctor visits, patients may have 1 support person age 41 or older with them. For treatment visits, patients can not have anyone with them due to social distancing guidelines and our immunocompromised population.

## 2021-05-18 ENCOUNTER — Telehealth (INDEPENDENT_AMBULATORY_CARE_PROVIDER_SITE_OTHER): Payer: Self-pay | Admitting: Internal Medicine

## 2021-05-18 NOTE — Telephone Encounter (Signed)
Patient called the office stated she is doing much better since being on the antibiotic

## 2021-05-23 ENCOUNTER — Encounter: Payer: Self-pay | Admitting: Physician Assistant

## 2021-05-23 ENCOUNTER — Ambulatory Visit (INDEPENDENT_AMBULATORY_CARE_PROVIDER_SITE_OTHER): Payer: Medicare Other | Admitting: Physician Assistant

## 2021-05-23 ENCOUNTER — Other Ambulatory Visit: Payer: Self-pay

## 2021-05-23 DIAGNOSIS — D1800 Hemangioma unspecified site: Secondary | ICD-10-CM

## 2021-05-23 DIAGNOSIS — D485 Neoplasm of uncertain behavior of skin: Secondary | ICD-10-CM

## 2021-05-23 DIAGNOSIS — Z1283 Encounter for screening for malignant neoplasm of skin: Secondary | ICD-10-CM

## 2021-05-23 DIAGNOSIS — L821 Other seborrheic keratosis: Secondary | ICD-10-CM | POA: Diagnosis not present

## 2021-05-23 DIAGNOSIS — L814 Other melanin hyperpigmentation: Secondary | ICD-10-CM | POA: Diagnosis not present

## 2021-05-23 DIAGNOSIS — C44629 Squamous cell carcinoma of skin of left upper limb, including shoulder: Secondary | ICD-10-CM | POA: Diagnosis not present

## 2021-05-23 DIAGNOSIS — L578 Other skin changes due to chronic exposure to nonionizing radiation: Secondary | ICD-10-CM

## 2021-05-23 DIAGNOSIS — D229 Melanocytic nevi, unspecified: Secondary | ICD-10-CM | POA: Diagnosis not present

## 2021-05-23 NOTE — Patient Instructions (Signed)

## 2021-05-24 ENCOUNTER — Encounter: Payer: Self-pay | Admitting: Physician Assistant

## 2021-05-24 NOTE — Progress Notes (Signed)
   Follow-Up Visit   Subjective  Cassidy Barber is a 70 y.o. female who presents for the following: Follow-up (Patient here today for Moh's follow up. Per patient she's concerned about a lesion on her scalp that is crusty in her Moh's site. ). Only slightly scaly and it is white scale.    The following portions of the chart were reviewed this encounter and updated as appropriate:  Tobacco  Allergies  Meds  Problems  Med Hx  Surg Hx  Fam Hx      Objective  Well appearing patient in no apparent distress; mood and affect are within normal limits.  A focused examination was performed including waist up and legs. Relevant physical exam findings are noted in the Assessment and Plan.  Left Forearm - Anterior Smooth pink plaque with central punctate crust.      Assessment & Plan  SCC (squamous cell carcinoma), arm, left Left Forearm - Anterior  Skin excision  Total excision diameter (cm):  4.6 Informed consent: discussed and consent obtained   Timeout: patient name, date of birth, surgical site, and procedure verified   Anesthesia: the lesion was anesthetized in a standard fashion   Anesthetic:  1% lidocaine w/ epinephrine 1-100,000 local infiltration Instrument used: #15 blade   Hemostasis achieved with: pressure and electrodesiccation   Outcome: patient tolerated procedure well with no complications   Post-procedure details: sterile dressing applied and wound care instructions given   Dressing type: bandage, petrolatum and pressure dressing   Additional details:  3-0 vicryl x 3 4-0 Ethilon x 7  Lateral Margin stained  Skin repair Complexity:  Simple Final length (cm):  4.6 Informed consent: discussed and consent obtained   Timeout: patient name, date of birth, surgical site, and procedure verified   Procedure prep:  Patient was prepped and draped in usual sterile fashion Prep type:  Chlorhexidine Anesthesia: the lesion was anesthetized in a standard fashion    Undermining: edges undermined   Fine/surface layer approximation (top stitches):  Suture size:  4-0 Suture type comment:  Nylon Stitches: simple interrupted   Hemostasis achieved with: suture Outcome: patient tolerated procedure well with no complications   Post-procedure details: wound care instructions given    Specimen 1 - Surgical pathology Differential Diagnosis: bcc vs scc - excision   Check Margins: Yes Lentigines - Scattered tan macules - Discussed due to sun exposure - Benign, observe - Call for any changes  Seborrheic Keratoses - Stuck-on, waxy, tan-brown papules and plaques  - Discussed benign etiology and prognosis. - Observe - Call for any changes  Melanocytic Nevi - Tan-brown and/or pink-flesh-colored symmetric macules and papules - Benign appearing on exam today - Observation - Call clinic for new or changing moles - Recommend daily use of broad spectrum spf 30+ sunscreen to sun-exposed areas.   Hemangiomas - Red papules - Discussed benign nature - Observe - Call for any changes  Actinic Damage - diffuse scaly erythematous macules with underlying dyspigmentation - Recommend daily broad spectrum sunscreen SPF 30+ to sun-exposed areas, reapply every 2 hours as needed.  - Call for new or changing lesions.  Skin cancer screening performed today.   I, Miki Blank, PA-C, have reviewed all documentation's for this visit.  The documentation on 06/14/21 for the exam, diagnosis, procedures and orders are all accurate and complete.

## 2021-05-26 ENCOUNTER — Encounter (HOSPITAL_COMMUNITY): Payer: Self-pay

## 2021-05-26 ENCOUNTER — Other Ambulatory Visit: Payer: Self-pay

## 2021-05-26 ENCOUNTER — Inpatient Hospital Stay (HOSPITAL_COMMUNITY): Payer: Medicare Other

## 2021-05-26 VITALS — BP 91/69 | HR 64 | Temp 97.9°F | Resp 18

## 2021-05-26 DIAGNOSIS — D508 Other iron deficiency anemias: Secondary | ICD-10-CM

## 2021-05-26 DIAGNOSIS — D509 Iron deficiency anemia, unspecified: Secondary | ICD-10-CM | POA: Diagnosis not present

## 2021-05-26 DIAGNOSIS — Z85038 Personal history of other malignant neoplasm of large intestine: Secondary | ICD-10-CM | POA: Diagnosis not present

## 2021-05-26 DIAGNOSIS — Z944 Liver transplant status: Secondary | ICD-10-CM | POA: Diagnosis not present

## 2021-05-26 DIAGNOSIS — Z79899 Other long term (current) drug therapy: Secondary | ICD-10-CM | POA: Diagnosis not present

## 2021-05-26 MED ORDER — SODIUM CHLORIDE 0.9 % IV SOLN
300.0000 mg | Freq: Once | INTRAVENOUS | Status: AC
  Administered 2021-05-26: 300 mg via INTRAVENOUS
  Filled 2021-05-26: qty 15

## 2021-05-26 MED ORDER — SODIUM CHLORIDE 0.9 % IV SOLN: Freq: Once | INTRAVENOUS | Status: AC

## 2021-05-26 NOTE — Patient Instructions (Signed)
[facility]  Discharge Instructions: Thank you for choosing [facility] to provide your oncology and hematology care.  If you have a lab appointment with the [facility], please come in thru the Main Entrance and check in at the main information desk.  Wear comfortable clothing and clothing appropriate for easy access to any Portacath or PICC line.   We strive to give you quality time with your provider. You may need to reschedule your appointment if you arrive late (15 or more minutes).  Arriving late affects you and other patients whose appointments are after yours.  Also, if you miss three or more appointments without notifying the office, you may be dismissed from the clinic at the provider's discretion.      For prescription refill requests, have your pharmacy contact our office and allow 72 hours for refills to be completed.    Today you received the following:  Venofer iron infusion      To help prevent nausea and vomiting after your treatment, we encourage you to take your nausea medication as directed.  BELOW ARE SYMPTOMS THAT SHOULD BE REPORTED IMMEDIATELY: *FEVER GREATER THAN 100.4 F (38 C) OR HIGHER *CHILLS OR SWEATING *NAUSEA AND VOMITING THAT IS NOT CONTROLLED WITH YOUR NAUSEA MEDICATION *UNUSUAL SHORTNESS OF BREATH *UNUSUAL BRUISING OR BLEEDING *URINARY PROBLEMS (pain or burning when urinating, or frequent urination) *BOWEL PROBLEMS (unusual diarrhea, constipation, pain near the anus) TENDERNESS IN MOUTH AND THROAT WITH OR WITHOUT PRESENCE OF ULCERS (sore throat, sores in mouth, or a toothache) UNUSUAL RASH, SWELLING OR PAIN  UNUSUAL VAGINAL DISCHARGE OR ITCHING   Items with * indicate a potential emergency and should be followed up as soon as possible or go to the Emergency Department if any problems should occur.  Please show the CHEMOTHERAPY ALERT CARD or IMMUNOTHERAPY ALERT CARD at check-in to the Emergency Department and triage  nurse.  Should you have questions after your visit or need to cancel or reschedule your appointment, please contact [facility] [phone]  and follow the prompts.  Office hours are 8:00 a.m. to 4:30 p.m. Monday - Friday. Please note that voicemails left after 4:00 p.m. may not be returned until the following business day.  We are closed weekends and major holidays. You have access to a nurse at all times for urgent questions. Please call the main number to the clinic [phone] and follow the prompts.  For any non-urgent questions, you may also contact your provider using MyChart. We now offer e-Visits for anyone 42 and older to request care online for non-urgent symptoms. For details visit [email or web address].   Also download the MyChart app! Go to the app store, search "MyChart", open the app, select [facility], and log in with your MyChart username and password.  Due to Covid, a mask is required upon entering the hospital/clinic. If you do not have a mask, one will be given to you upon arrival. For doctor visits, patients may have 1 support person aged 15 or older with them. For treatment visits, patients cannot have anyone with them due to current Covid guidelines and our immunocompromised population.

## 2021-05-26 NOTE — Progress Notes (Signed)
Tolerated infusion w/o adverse reaction.  Alert, in no distress.  VSS.  Remained stable throughout infusion.  Discharged ambulatory in stable condition.

## 2021-05-30 DIAGNOSIS — Z944 Liver transplant status: Secondary | ICD-10-CM | POA: Diagnosis not present

## 2021-06-02 ENCOUNTER — Ambulatory Visit (HOSPITAL_COMMUNITY): Payer: Medicare Other

## 2021-06-02 ENCOUNTER — Inpatient Hospital Stay (HOSPITAL_COMMUNITY): Payer: Medicare Other

## 2021-06-02 ENCOUNTER — Other Ambulatory Visit: Payer: Self-pay

## 2021-06-02 VITALS — BP 134/48 | HR 65 | Temp 96.9°F | Resp 18

## 2021-06-02 DIAGNOSIS — D508 Other iron deficiency anemias: Secondary | ICD-10-CM

## 2021-06-02 DIAGNOSIS — Z79899 Other long term (current) drug therapy: Secondary | ICD-10-CM | POA: Diagnosis not present

## 2021-06-02 DIAGNOSIS — Z944 Liver transplant status: Secondary | ICD-10-CM | POA: Diagnosis not present

## 2021-06-02 DIAGNOSIS — D509 Iron deficiency anemia, unspecified: Secondary | ICD-10-CM | POA: Diagnosis not present

## 2021-06-02 DIAGNOSIS — Z85038 Personal history of other malignant neoplasm of large intestine: Secondary | ICD-10-CM | POA: Diagnosis not present

## 2021-06-02 MED ORDER — SODIUM CHLORIDE 0.9 % IV SOLN: Freq: Once | INTRAVENOUS | Status: AC

## 2021-06-02 MED ORDER — SODIUM CHLORIDE 0.9 % IV SOLN
300.0000 mg | Freq: Once | INTRAVENOUS | Status: AC
  Administered 2021-06-02: 300 mg via INTRAVENOUS
  Filled 2021-06-02: qty 300

## 2021-06-02 NOTE — Progress Notes (Signed)
Patient presents today for Venofer infusion per providers order.  Vital signs WNL.  Patient has no new complaint at this time.  Peripheral IV started and blood return noted pre and post infusion.  Venofer infusion given today per MD orders.  Stable during infusion without adverse affects.  Vital signs stable.  No complaints at this time. Patient refuses to stay the thirty minute post infusion wait time. Discharge from clinic ambulatory in stable condition.  Alert and oriented X 3.  Follow up with [facility] as scheduled.

## 2021-06-02 NOTE — Patient Instructions (Signed)
[facility]  Discharge Instructions: Thank you for choosing [facility] to provide your oncology and hematology care.  If you have a lab appointment with the [facility], please come in thru the Main Entrance and check in at the main information desk.  Wear comfortable clothing and clothing appropriate for easy access to any Portacath or PICC line.   We strive to give you quality time with your provider. You may need to reschedule your appointment if you arrive late (15 or more minutes).  Arriving late affects you and other patients whose appointments are after yours.  Also, if you miss three or more appointments without notifying the office, you may be dismissed from the clinic at the provider's discretion.      For prescription refill requests, have your pharmacy contact our office and allow 72 hours for refills to be completed.    Today you received the following chemotherapy and/or immunotherapy agents Venofer infusion      To help prevent nausea and vomiting after your treatment, we encourage you to take your nausea medication as directed.  BELOW ARE SYMPTOMS THAT SHOULD BE REPORTED IMMEDIATELY: *FEVER GREATER THAN 100.4 F (38 C) OR HIGHER *CHILLS OR SWEATING *NAUSEA AND VOMITING THAT IS NOT CONTROLLED WITH YOUR NAUSEA MEDICATION *UNUSUAL SHORTNESS OF BREATH *UNUSUAL BRUISING OR BLEEDING *URINARY PROBLEMS (pain or burning when urinating, or frequent urination) *BOWEL PROBLEMS (unusual diarrhea, constipation, pain near the anus) TENDERNESS IN MOUTH AND THROAT WITH OR WITHOUT PRESENCE OF ULCERS (sore throat, sores in mouth, or a toothache) UNUSUAL RASH, SWELLING OR PAIN  UNUSUAL VAGINAL DISCHARGE OR ITCHING   Items with * indicate a potential emergency and should be followed up as soon as possible or go to the Emergency Department if any problems should occur.  Please show the CHEMOTHERAPY ALERT CARD or IMMUNOTHERAPY ALERT CARD at check-in to the Emergency  Department and triage nurse.  Should you have questions after your visit or need to cancel or reschedule your appointment, please contact [facility] [phone]  and follow the prompts.  Office hours are 8:00 a.m. to 4:30 p.m. Monday - Friday. Please note that voicemails left after 4:00 p.m. may not be returned until the following business day.  We are closed weekends and major holidays. You have access to a nurse at all times for urgent questions. Please call the main number to the clinic [phone] and follow the prompts.  For any non-urgent questions, you may also contact your provider using MyChart. We now offer e-Visits for anyone 83 and older to request care online for non-urgent symptoms. For details visit [email or web address].   Also download the MyChart app! Go to the app store, search "MyChart", open the app, select , and log in with your MyChart username and password.  Due to Covid, a mask is required upon entering the hospital/clinic. If you do not have a mask, one will be given to you upon arrival. For doctor visits, patients may have 1 support person aged 70 or older with them. For treatment visits, patients cannot have anyone with them due to current Covid guidelines and our immunocompromised population.

## 2021-06-05 ENCOUNTER — Ambulatory Visit: Payer: Medicare Other

## 2021-06-05 ENCOUNTER — Other Ambulatory Visit: Payer: Self-pay

## 2021-06-05 ENCOUNTER — Inpatient Hospital Stay (HOSPITAL_COMMUNITY): Payer: Medicare Other

## 2021-06-05 VITALS — BP 122/72 | HR 70 | Temp 96.9°F | Resp 18

## 2021-06-05 DIAGNOSIS — Z85038 Personal history of other malignant neoplasm of large intestine: Secondary | ICD-10-CM | POA: Diagnosis not present

## 2021-06-05 DIAGNOSIS — Z944 Liver transplant status: Secondary | ICD-10-CM | POA: Diagnosis not present

## 2021-06-05 DIAGNOSIS — Z79899 Other long term (current) drug therapy: Secondary | ICD-10-CM | POA: Diagnosis not present

## 2021-06-05 DIAGNOSIS — D508 Other iron deficiency anemias: Secondary | ICD-10-CM

## 2021-06-05 DIAGNOSIS — D509 Iron deficiency anemia, unspecified: Secondary | ICD-10-CM | POA: Diagnosis not present

## 2021-06-05 MED ORDER — SODIUM CHLORIDE 0.9 % IV SOLN: Freq: Once | INTRAVENOUS | Status: AC

## 2021-06-05 MED ORDER — SODIUM CHLORIDE 0.9 % IV SOLN
300.0000 mg | Freq: Once | INTRAVENOUS | Status: AC
  Administered 2021-06-05: 300 mg via INTRAVENOUS
  Filled 2021-06-05: qty 300

## 2021-06-05 NOTE — Progress Notes (Signed)
Pt here today for Venofer per provider's order. Vital signs stable. Pt voiced no new complaints at this time. Peripheral IV started with good blood return pre and post infusion.  IV iron infusion given today per MD orders. Tolerated infusion without adverse affects. Vital signs stable. No complaints at this time. Discharged from clinic ambulatory in stable condition. Alert and oriented x 3. F/U with [facility] as scheduled. Pt did not wait the 30 minute wait time per policy.

## 2021-06-05 NOTE — Patient Instructions (Signed)
[facility]  Discharge Instructions: Thank you for choosing [facility] to provide your oncology and hematology care.  If you have a lab appointment with the Cancer Center, please come in thru the Main Entrance and check in at the main information desk.  Wear comfortable clothing and clothing appropriate for easy access to any Portacath or PICC line.   We strive to give you quality time with your provider. You may need to reschedule your appointment if you arrive late (15 or more minutes).  Arriving late affects you and other patients whose appointments are after yours.  Also, if you miss three or more appointments without notifying the office, you may be dismissed from the clinic at the provider's discretion.      For prescription refill requests, have your pharmacy contact our office and allow 72 hours for refills to be completed.    Today you received IV iron infusion.     BELOW ARE SYMPTOMS THAT SHOULD BE REPORTED IMMEDIATELY: *FEVER GREATER THAN 100.4 F (38 C) OR HIGHER *CHILLS OR SWEATING *NAUSEA AND VOMITING THAT IS NOT CONTROLLED WITH YOUR NAUSEA MEDICATION *UNUSUAL SHORTNESS OF BREATH *UNUSUAL BRUISING OR BLEEDING *URINARY PROBLEMS (pain or burning when urinating, or frequent urination) *BOWEL PROBLEMS (unusual diarrhea, constipation, pain near the anus) TENDERNESS IN MOUTH AND THROAT WITH OR WITHOUT PRESENCE OF ULCERS (sore throat, sores in mouth, or a toothache) UNUSUAL RASH, SWELLING OR PAIN  UNUSUAL VAGINAL DISCHARGE OR ITCHING   Items with * indicate a potential emergency and should be followed up as soon as possible or go to the Emergency Department if any problems should occur.  Please show the CHEMOTHERAPY ALERT CARD or IMMUNOTHERAPY ALERT CARD at check-in to the Emergency Department and triage nurse.  Should you have questions after your visit or need to cancel or reschedule your appointment, please contact [facility]  [phone]  and follow the prompts.  Office hours are 8:00 a.m. to 4:30 p.m. Monday - Friday. Please note that voicemails left after 4:00 p.m. may not be returned until the following business day.  We are closed weekends and major holidays. You have access to a nurse at all times for urgent questions. Please call the main number to the clinic [phone] and follow the prompts.  For any non-urgent questions, you may also contact your provider using MyChart. We now offer e-Visits for anyone 18 and older to request care online for non-urgent symptoms. For details visit [email or web address].   Also download the MyChart app! Go to the app store, search "MyChart", open the app, select [facility], and log in with your MyChart username and password.  Due to Covid, a mask is required upon entering the hospital/clinic. If you do not have a mask, one will be given to you upon arrival. For doctor visits, patients may have 1 support person aged 18 or older with them. For treatment visits, patients cannot have anyone with them due to current Covid guidelines and our immunocompromised population.  

## 2021-06-13 ENCOUNTER — Encounter (HOSPITAL_COMMUNITY): Payer: Medicare Other

## 2021-06-13 ENCOUNTER — Encounter: Payer: Self-pay | Admitting: Physician Assistant

## 2021-06-13 ENCOUNTER — Ambulatory Visit (INDEPENDENT_AMBULATORY_CARE_PROVIDER_SITE_OTHER): Payer: Medicare Other | Admitting: Physician Assistant

## 2021-06-13 ENCOUNTER — Other Ambulatory Visit: Payer: Self-pay

## 2021-06-13 DIAGNOSIS — D485 Neoplasm of uncertain behavior of skin: Secondary | ICD-10-CM

## 2021-06-13 DIAGNOSIS — C44622 Squamous cell carcinoma of skin of right upper limb, including shoulder: Secondary | ICD-10-CM | POA: Diagnosis not present

## 2021-06-13 DIAGNOSIS — C44722 Squamous cell carcinoma of skin of right lower limb, including hip: Secondary | ICD-10-CM

## 2021-06-13 NOTE — Patient Instructions (Signed)

## 2021-06-20 ENCOUNTER — Telehealth: Payer: Self-pay

## 2021-06-20 DIAGNOSIS — Z944 Liver transplant status: Secondary | ICD-10-CM | POA: Diagnosis not present

## 2021-06-20 NOTE — Telephone Encounter (Signed)
Phone call to patient with her pathology results. Patient aware of results.  

## 2021-06-20 NOTE — Telephone Encounter (Signed)
-----   Message from [name], [place] sent at 06/20/2021  2:49 PM EDT ----- Get mohs appointment for shin.  Will most likely recur. See how she is healing.

## 2021-06-22 ENCOUNTER — Telehealth: Payer: Self-pay | Admitting: *Deleted

## 2021-06-22 ENCOUNTER — Other Ambulatory Visit: Payer: Self-pay | Admitting: Family

## 2021-06-22 DIAGNOSIS — F1320 Sedative, hypnotic or anxiolytic dependence, uncomplicated: Secondary | ICD-10-CM

## 2021-06-22 DIAGNOSIS — F411 Generalized anxiety disorder: Secondary | ICD-10-CM

## 2021-06-22 DIAGNOSIS — Z79899 Other long term (current) drug therapy: Secondary | ICD-10-CM

## 2021-06-22 NOTE — Telephone Encounter (Signed)
Called to inform Cassidy Barber that [facility] wants to excise that right shoulder after she has the MOHS on right lower leg-anterior. Informed [name] has to be scheduled at least 3 week after MOHS surgery.  Cassidy Barber understood and already has surgery appointment scheduled for October- will change excision here once she know date & time of MOHS procedure.

## 2021-06-22 NOTE — Progress Notes (Signed)
Follow-Up Visit   Subjective  Cassidy Barber is a 69 y.o. female who presents for the following: Follow-up (Here to have skin check. Concerns right leg. ).   The following portions of the chart were reviewed this encounter and updated as appropriate:  Tobacco  Allergies  Meds  Problems  Med Hx  Surg Hx  Fam Hx      Objective  Well appearing patient in no apparent distress; mood and affect are within normal limits.  A full examination was performed including scalp, head, eyes, ears, nose, lips, neck, chest, axillae, abdomen, back, buttocks, bilateral upper extremities, bilateral lower extremities, hands, feet, fingers, toes, fingernails, and toenails. All findings within normal limits unless otherwise noted below.  Right Shoulder - Anterior Thick pink crust     Right Thigh - Anterior Smooth papule     Right Lower Leg - Anterior Ulcerated pink plaque     Assessment & Plan  SCC (squamous cell carcinoma), arm, right Right Shoulder - Anterior  Skin / nail biopsy Type of biopsy: tangential   Informed consent: discussed and consent obtained   Timeout: patient name, date of birth, surgical site, and procedure verified   Procedure prep:  Patient was prepped and draped in usual sterile fashion (Non sterile) Prep type:  Chlorhexidine Anesthesia: the lesion was anesthetized in a standard fashion   Anesthetic:  1% lidocaine w/ epinephrine 1-100,000 local infiltration Instrument used: flexible razor blade   Outcome: patient tolerated procedure well   Post-procedure details: wound care instructions given    Destruction of lesion Complexity: simple   Destruction method: cryotherapy   Informed consent: discussed and consent obtained   Timeout:  patient name, date of birth, surgical site, and procedure verified Anesthesia: the lesion was anesthetized in a standard fashion   Anesthetic:  1% lidocaine w/ epinephrine 1-100,000 local infiltration Lesion destroyed using  liquid nitrogen: Yes   Region frozen until ice ball extended beyond lesion: Yes   Cryotherapy cycles:  1.3 Margin per side (cm):  0.1 Final wound size (cm):  1.3 Hemostasis achieved with:  aluminum chloride Outcome: patient tolerated procedure well with no complications   Post-procedure details: wound care instructions given    Specimen 1 - Surgical pathology Differential Diagnosis: bcc vs scc- txpbx  Check Margins: No  SCC (squamous cell carcinoma), leg, right (2) Right Thigh - Anterior  Skin / nail biopsy Type of biopsy: tangential   Informed consent: discussed and consent obtained   Timeout: patient name, date of birth, surgical site, and procedure verified   Procedure prep:  Patient was prepped and draped in usual sterile fashion (Non sterile) Prep type:  Chlorhexidine Anesthesia: the lesion was anesthetized in a standard fashion   Anesthetic:  1% lidocaine w/ epinephrine 1-100,000 local infiltration Instrument used: flexible razor blade   Outcome: patient tolerated procedure well   Post-procedure details: wound care instructions given    Destruction of lesion Complexity: simple   Destruction method: electrodesiccation and curettage   Informed consent: discussed and consent obtained   Timeout:  patient name, date of birth, surgical site, and procedure verified Anesthesia: the lesion was anesthetized in a standard fashion   Anesthetic:  1% lidocaine w/ epinephrine 1-100,000 local infiltration Curettage performed in three different directions: Yes   Electrodesiccation performed over the curetted area: Yes   Curettage cycles:  3 Margin per side (cm):  0.1 Final wound size (cm):  1.1 Hemostasis achieved with:  aluminum chloride Outcome: patient tolerated procedure well with no  complications   Post-procedure details: wound care instructions given    Specimen 2 - Surgical pathology Differential Diagnosis: bcc vs scc-txpbx  Check Margins: No  Right Lower Leg -  Anterior  Skin / nail biopsy Type of biopsy: tangential   Informed consent: discussed and consent obtained   Timeout: patient name, date of birth, surgical site, and procedure verified   Procedure prep:  Patient was prepped and draped in usual sterile fashion (Non sterile) Prep type:  Chlorhexidine Anesthesia: the lesion was anesthetized in a standard fashion   Anesthetic:  1% lidocaine w/ epinephrine 1-100,000 local infiltration Instrument used: flexible razor blade   Outcome: patient tolerated procedure well   Post-procedure details: wound care instructions given    Destruction of lesion Complexity: simple   Destruction method: electrodesiccation and curettage   Informed consent: discussed and consent obtained   Timeout:  patient name, date of birth, surgical site, and procedure verified Anesthesia: the lesion was anesthetized in a standard fashion   Anesthetic:  1% lidocaine w/ epinephrine 1-100,000 local infiltration Curettage performed in three different directions: Yes   Electrodesiccation performed over the curetted area: Yes   Curettage cycles:  3 Margin per side (cm):  0.1 Final wound size (cm):  1.1 Hemostasis achieved with:  aluminum chloride Outcome: patient tolerated procedure well with no complications   Post-procedure details: wound care instructions given    Specimen 3 - Surgical pathology Differential Diagnosis: bcc vs scc- txpbx  Check Margins: No   I, Marijke Guadiana, PA-C, have reviewed all documentation's for this visit.  The documentation on 06/22/21 for the exam, diagnosis, procedures and orders are all accurate and complete.

## 2021-06-29 ENCOUNTER — Ambulatory Visit (INDEPENDENT_AMBULATORY_CARE_PROVIDER_SITE_OTHER): Payer: Medicare Other

## 2021-06-29 VITALS — Ht 64.0 in | Wt 139.0 lb

## 2021-06-29 DIAGNOSIS — Z0000 Encounter for general adult medical examination without abnormal findings: Secondary | ICD-10-CM

## 2021-06-29 NOTE — Patient Instructions (Signed)
Cassidy Barber , Thank you for taking time to come for your Medicare Wellness Visit. I appreciate your ongoing commitment to your health goals. Please review the following plan we discussed and let me know if I can assist you in the future.   Screening recommendations/referrals: Colonoscopy: Sigmoidoscopy Done 02/15/2021 - Repeat in 5 years  Mammogram: Done 02/28/2021 - Repeat annually  Bone Density: Done 12/10/2017 - Repeat every 2 years Recommended yearly ophthalmology/optometry visit for glaucoma screening and checkup Recommended yearly dental visit for hygiene and checkup  Vaccinations: Influenza vaccine: Done 08/25/2020 - Repeat annually Pneumococcal vaccine: Done 07/13/2012 & 07/26/2017 Tdap vaccine: Done 07/01/2019 - Repeat in 10 years  Shingles vaccine: Done 06/23/2018 & 04/21/2018   Covid-19: Done 01/07/20, 01/16/20, & 07/13/2020  Advanced directives: Advance directive discussed with you today. Even though you declined this today, please call our office should you change your mind, and we can give you the proper paperwork for you to fill out.   Conditions/risks identified: Aim for 30 minutes of exercise or brisk walking each day, drink 6-8 glasses of water and eat lots of fruits and vegetables.   Next appointment: Follow up in one year for your annual wellness visit    Preventive Care 65 Years and Older, Female Preventive care refers to lifestyle choices and visits with your health care provider that can promote health and wellness. What does preventive care include? A yearly physical exam. This is also called an annual well check. Dental exams once or twice a year. Routine eye exams. Ask your health care provider how often you should have your eyes checked. Personal lifestyle choices, including: Daily care of your teeth and gums. Regular physical activity. Eating a healthy diet. Avoiding tobacco and drug use. Limiting alcohol use. Practicing safe sex. Taking low-dose aspirin every  day. Taking vitamin and mineral supplements as recommended by your health care provider. What happens during an annual well check? The services and screenings done by your health care provider during your annual well check will depend on your age, overall health, lifestyle risk factors, and family history of disease. Counseling  Your health care provider may ask you questions about your: Alcohol use. Tobacco use. Drug use. Emotional well-being. Home and relationship well-being. Sexual activity. Eating habits. History of falls. Memory and ability to understand (cognition). Work and work Statistician. Reproductive health. Screening  You may have the following tests or measurements: Height, weight, and BMI. Blood pressure. Lipid and cholesterol levels. These may be checked every 5 years, or more frequently if you are over 43 years old. Skin check. Lung cancer screening. You may have this screening every year starting at age 54 if you have a 30-pack-year history of smoking and currently smoke or have quit within the past 15 years. Fecal occult blood test (FOBT) of the stool. You may have this test every year starting at age 44. Flexible sigmoidoscopy or colonoscopy. You may have a sigmoidoscopy every 5 years or a colonoscopy every 10 years starting at age 63. Hepatitis C blood test. Hepatitis B blood test. Sexually transmitted disease (STD) testing. Diabetes screening. This is done by checking your blood sugar (glucose) after you have not eaten for a while (fasting). You may have this done every 1-3 years. Bone density scan. This is done to screen for osteoporosis. You may have this done starting at age 19. Mammogram. This may be done every 1-2 years. Talk to your health care provider about how often you should have regular mammograms. Talk with  your health care provider about your test results, treatment options, and if necessary, the need for more tests. Vaccines  Your health care  provider may recommend certain vaccines, such as: Influenza vaccine. This is recommended every year. Tetanus, diphtheria, and acellular pertussis (Tdap, Td) vaccine. You may need a Td booster every 10 years. Zoster vaccine. You may need this after age 35. Pneumococcal 13-valent conjugate (PCV13) vaccine. One dose is recommended after age 5. Pneumococcal polysaccharide (PPSV23) vaccine. One dose is recommended after age 68. Talk to your health care provider about which screenings and vaccines you need and how often you need them. This information is not intended to replace advice given to you by your health care provider. Make sure you discuss any questions you have with your health care provider. Document Released: 11/25/2015 Document Revised: 07/18/2016 Document Reviewed: 08/30/2015 Elsevier Interactive Patient Education  2017 [place] Prevention in the Home Falls can cause injuries. They can happen to people of all ages. There are many things you can do to make your home safe and to help prevent falls. What can I do on the outside of my home? Regularly fix the edges of walkways and driveways and fix any cracks. Remove anything that might make you trip as you walk through a door, such as a raised step or threshold. Trim any bushes or trees on the path to your home. Use bright outdoor lighting. Clear any walking paths of anything that might make someone trip, such as rocks or tools. Regularly check to see if handrails are loose or broken. Make sure that both sides of any steps have handrails. Any raised decks and porches should have guardrails on the edges. Have any leaves, snow, or ice cleared regularly. Use sand or salt on walking paths during winter. Clean up any spills in your garage right away. This includes oil or grease spills. What can I do in the bathroom? Use night lights. Install grab bars by the toilet and in the tub and shower. Do not use towel bars as grab  bars. Use non-skid mats or decals in the tub or shower. If you need to sit down in the shower, use a plastic, non-slip stool. Keep the floor dry. Clean up any water that spills on the floor as soon as it happens. Remove soap buildup in the tub or shower regularly. Attach bath mats securely with double-sided non-slip rug tape. Do not have throw rugs and other things on the floor that can make you trip. What can I do in the bedroom? Use night lights. Make sure that you have a light by your bed that is easy to reach. Do not use any sheets or blankets that are too big for your bed. They should not hang down onto the floor. Have a firm chair that has side arms. You can use this for support while you get dressed. Do not have throw rugs and other things on the floor that can make you trip. What can I do in the kitchen? Clean up any spills right away. Avoid walking on wet floors. Keep items that you use a lot in easy-to-reach places. If you need to reach something above you, use a strong step stool that has a grab bar. Keep electrical cords out of the way. Do not use floor polish or wax that makes floors slippery. If you must use wax, use non-skid floor wax. Do not have throw rugs and other things on the floor that can make you trip.  What can I do with my stairs? Do not leave any items on the stairs. Make sure that there are handrails on both sides of the stairs and use them. Fix handrails that are broken or loose. Make sure that handrails are as long as the stairways. Check any carpeting to make sure that it is firmly attached to the stairs. Fix any carpet that is loose or worn. Avoid having throw rugs at the top or bottom of the stairs. If you do have throw rugs, attach them to the floor with carpet tape. Make sure that you have a light switch at the top of the stairs and the bottom of the stairs. If you do not have them, ask someone to add them for you. What else can I do to help prevent  falls? Wear shoes that: Do not have high heels. Have rubber bottoms. Are comfortable and fit you well. Are closed at the toe. Do not wear sandals. If you use a stepladder: Make sure that it is fully opened. Do not climb a closed stepladder. Make sure that both sides of the stepladder are locked into place. Ask someone to hold it for you, if possible. Clearly mark and make sure that you can see: Any grab bars or handrails. First and last steps. Where the edge of each step is. Use tools that help you move around (mobility aids) if they are needed. These include: Canes. Walkers. Scooters. Crutches. Turn on the lights when you go into a dark area. Replace any light bulbs as soon as they burn out. Set up your furniture so you have a clear path. Avoid moving your furniture around. If any of your floors are uneven, fix them. If there are any pets around you, be aware of where they are. Review your medicines with your doctor. Some medicines can make you feel dizzy. This can increase your chance of falling. Ask your doctor what other things that you can do to help prevent falls. This information is not intended to replace advice given to you by your health care provider. Make sure you discuss any questions you have with your health care provider. Document Released: 08/25/2009 Document Revised: 04/05/2016 Document Reviewed: 12/03/2014 Elsevier Interactive Patient Education  2017 [employer].

## 2021-06-29 NOTE — Progress Notes (Signed)
Subjective:   Cassidy Barber is a 70 y.o. female who presents for Medicare Annual (Subsequent) preventive examination.  Virtual Visit via Telephone Note  I connected with  Cassidy Barber on 06/29/21 at  9:00 AM EDT by telephone and verified that I am speaking with the correct person using two identifiers.  Location: Patient: Home Provider: [facility] Persons participating in the virtual visit: patient/Nurse Health Advisor   I discussed the limitations, risks, security and privacy concerns of performing an evaluation and management service by telephone and the availability of in person appointments. The patient expressed understanding and agreed to proceed.  Interactive audio and video telecommunications were attempted between this nurse and patient, however failed, due to patient having technical difficulties OR patient did not have access to video capability.  We continued and completed visit with audio only.  Some vital signs may be absent or patient reported.   Lavaughn Bisig E Trygve Thal, LPN   Review of Systems     Cardiac Risk Factors include: advanced age (>61mn, >>46women);hypertension;sedentary lifestyle     Objective:    Today's Vitals   06/29/21 0906  Weight: 139 lb (63 kg)  Height: '5\' 4"'$  (1.626 m)   Body mass index is 23.86 kg/m.  Advanced Directives 06/29/2021 06/05/2021 06/02/2021 05/26/2021 05/12/2021 03/10/2021 02/23/2021  Does Patient Have a Medical Advance Directive? No No No No No No No  Type of Advance Directive - - - - - - -  Copy of Healthcare Power of Attorney in Chart? - - - - - - -  Would patient like information on creating a medical advance directive? No - Patient declined No - Patient declined No - Patient declined No - Patient declined No - Patient declined No - Patient declined No - Patient declined  Pre-existing out of facility DNR order (yellow form or pink MOST form) - - - - - - -    Current Medications (verified) Outpatient Encounter Medications as of 06/29/2021   Medication Sig   acetaminophen (TYLENOL) 500 MG tablet Take 1,000 mg by mouth every 6 (six) hours as needed (for pain.).    alendronate (FOSAMAX) 70 MG tablet TAKE 1 TABLET WEEKLY (TAKE WITH 8OZ OF WATER 30 MINUTES BEFORE BREAKFAST) (Patient taking differently: Take 70 mg by mouth every Thursday. (TAKE WITH 8OZ OF WATER 30 MINUTES BEFORE BREAKFAST))   ALPRAZolam (XANAX) 0.5 MG tablet Take 1 tablet (0.5 mg total) by mouth at bedtime.   aspirin EC 81 MG tablet Take 81 mg by mouth daily.   Biotin 1000 MCG tablet Take 1,000 mcg by mouth 3 (three) times daily.   Calcium Citrate-Vitamin D (CALCIUM CITRATE + D3 PO) Take 600 mg by mouth 2 (two) times daily.   cetirizine (ZYRTEC) 10 MG tablet TAKE 1 TO 2 TABLETS TWICE DAILY (Patient taking differently: Take 10 mg by mouth daily as needed for allergies.)   clotrimazole-betamethasone (LOTRISONE) cream Apply 1 application topically 2 (two) times daily. (Patient taking differently: Apply 1 application topically daily as needed (Rash).)   Cyanocobalamin (VITAMIN B-12) 5000 MCG TBDP Take 5,000 mcg by mouth 2 (two) times a week. Every Monday and Wednesday.   famotidine (PEPCID) 20 MG tablet Take 20 mg by mouth daily.   fluorouracil (EFUDEX) 5 % cream Apply 1 application topically daily as needed (cancer spots).    gabapentin (NEURONTIN) 100 MG capsule Take 1 capsule (100 mg total) by mouth 3 (three) times daily. (Patient taking differently: Take 100 mg by mouth daily.)   levothyroxine (  SYNTHROID) 112 MCG tablet Take 1 tablet (112 mcg total) by mouth daily.   loperamide (IMODIUM) 2 MG capsule Take 2 capsules (4 mg total) by mouth 3 (three) times daily as needed for diarrhea or loose stools.   losartan (COZAAR) 25 MG tablet TAKE ONE (1) TABLET EACH DAY (Patient taking differently: Take 25 mg by mouth daily.)   metoprolol succinate (TOPROL XL) 25 MG 24 hr tablet Take 1 tablet (25 mg total) by mouth daily.   mupirocin ointment (BACTROBAN) 2 % Apply 1 application  topically daily as needed (Cancer spots).   NIACINAMIDE-ZINC-FOLIC ACID PO Take 2 tablets by mouth 2 (two) times daily.   pantoprazole (PROTONIX) 40 MG tablet Take 1 tablet (40 mg total) by mouth 2 (two) times daily.   SSD 1 % cream Apply 1 application topically daily as needed (Radiation).   ursodiol (ACTIGALL) 250 MG tablet Take 250 mg by mouth 2 (two) times daily.    Vitamin D, Ergocalciferol, (DRISDOL) 1.25 MG (50000 UNIT) CAPS capsule TAKE 1 CAPSULE EVERY WEEK (Patient taking differently: Take 50,000 Units by mouth once a week.)   vitamin E 180 MG (400 UNITS) capsule Take 400 Units by mouth daily.   ZORTRESS 0.5 MG TABS Take 2 mg by mouth 2 (two) times daily.    [DISCONTINUED] metroNIDAZOLE (FLAGYL) 250 MG tablet Take 1 tablet (250 mg total) by mouth 3 (three) times daily. (Patient not taking: No sig reported)   No facility-administered encounter medications on file as of 06/29/2021.    Allergies (verified) Ciprofloxacin and Codeine   History: Past Medical History:  Diagnosis Date   Abdominal wall hernia    Incarcerated status post surgical repair 2019 - [facility]   Anemia of chronic disease    Atypical nevus 01/21/2018   atypical neoplasm- Left scalp-ant (txpbx + MOHS), atypical neoplasm- Left scalp post- (txpbx + MOHS)   Basal cell carcinoma    Colon cancer ([place])    Colon surgery 2005 and 2012   History of pulmonary hypertension    Pre liver transplant   History of seizures    Hypertension    Hypothyroidism    Lynch syndrome    Osteopenia    Primary biliary cirrhosis ([place])    Status post liver transplantation - follows at [facility]   SCCA (squamous cell carcinoma) of skin 02/23/2020   Right Upper Chest(moderate) (MOH's)   SCCA (squamous cell carcinoma) of skin 04/07/2020   Left Top Leg (Keratoacanthoma) treatment after biopsy   SCCA (squamous cell carcinoma) of skin 04/07/2020   Left Foot Dorsal (in situ) treatment after biopsy   SCCA (squamous cell carcinoma) of skin 03/27/2021    Right Upper Back (Keratoacanthoma) (excision) (clear)   SCCA (squamous cell carcinoma) of skin 06/13/2021   Right Shoulder - anterior (moderately differentiated) (tx p bx)   SCCA (squamous cell carcinoma) of skin 06/13/2021   Right Thigh - anterior (well differentiated) (tx p bx)   SCCA (squamous cell carcinoma) of skin 06/13/2021   Right Lower Leg - anterior (well differentiated) (tx p bx)   Squamous cell carcinoma of skin 04/22/2018   KA-Right mid chest (txpbx), KA-left elbow crease (txpbx), insitu-Right mid chest inf. (exc)   Squamous cell carcinoma of skin 05/20/2018   well diff-Left upper shin (txpbx), well diff-Right lower forearm (txpbx), well diff-Right upper shin (txpbx)   Squamous cell carcinoma of skin 06/11/2018   Scc + margin-Right mid chest inferior    Squamous cell carcinoma of skin 06/27/2018   well diff-Left mid  thigh(txpbx), well diff-Left inner thigh (txpbx),insitu-Right cheek (txpbx),well diff-right inner heel (txpbx)   Squamous cell carcinoma of skin 09/16/2018   well diff-left shoulder (txpbx), well diff-Right chin (txpbx), well diff-right chest lateral (CX35FU)   Squamous cell carcinoma of skin 10/01/2018   Right outer lower shin (Txpbx)   Squamous cell carcinoma of skin 04/03/2019   well diff-Right center chest (MOHS), in situ-Right ear   Squamous cell carcinoma of skin 08/05/2019   in situ-Left calf (txpbx), in situ-left bicep (txpbx), well diff-Left chest,inf(txpbx), in situ-Right chest inf-(txpbx)   Squamous cell carcinoma of skin 11/19/2019   KA-left top leg (txpbx), modify-Riight forehead-(mohs), in situ-right hand (txpbx), in situ-Right forearm (txpbx), well diff-Right chest (txpbx), well diff-chin (txpbx)   Squamous cell carcinoma of skin 01/06/2020   KA- Left top leg   Squamous cell carcinoma of skin 05/12/2013   bowens-middle of chest (CX35FU)   Squamous cell carcinoma of skin 05/18/2015   well diff-Left upper arm (CX35FU + Exc),KA-right chest(txpbx),  in situ-Left shin (txpbx), well diff-Right cheek (CX35FU), KA-Left post scalp (CX35FU)   Squamous cell carcinoma of skin 08/09/2015   KA-Left post scalp ((MOHS), in situ- mid chest (Txpbx +exc), in situ-Left upper arm inferior (txpbx)   Squamous cell carcinoma of skin 10/13/2015   Left upper arm-clear   Squamous cell carcinoma of skin 03/09/2016   mod diff-mid chest (txpbx+ exc), mod diff-Right chest (txpbx+exc), well diff-right cheek-(txpbx),well diff-Left hand-(txpbx), in situ-Left upper arm (txpbx), well diff-Right cheek -(txpbx), well diff-Right crease arm (txpbx)    Squamous cell carcinoma of skin 05/24/2016   well diff-Right nasal crease-(MOHS)   Squamous cell carcinoma of skin 08/02/2016   KA-Left chest med (txpbx)   Squamous cell carcinoma of skin 08/30/2016   in situ-Left outer zygoma (txpbx)   Squamous cell carcinoma of skin 12/06/2016   well diff-Left chest sup, Left shoudler, insitu- right post scalp   Squamous cell carcinoma of skin 02/14/2017   well diff-Left forearm (EXC),in situ-RIght ant neck   Squamous cell carcinoma of skin 06/14/2017   in situ-Right forearm (txpbx), in situ-Right chest (txpbx), well diff-left chest (txpbx), well diff-anterior neck- (txpbx)   Squamous cell carcinoma of skin 08/07/2017   well diff-Left upper shoulder (txpbx), sup and invasive-Left temple (txpbx), well diff-Right upper shin (txpbx), in situ-Right clavicle (txpbx)   Squamous cell carcinoma of skin 10/17/2017   well diff-ant. neck (MOHS), in situ-Right chest, inf (txpbx)   Squamous cell carcinoma of skin 01/21/2018   well diff- Right chest,ulnar (txpbx), well diff- right upper chest (txpbx), in situ-Right ant. crown (txpbx)   Squamous cell carcinoma of skin 08/02/2020   well diff-left lower leg-inferior (Txpbx)   Squamous cell carcinoma of skin 08/02/2020   well diff-right lower leg-mid (txpbx)   Squamous cell carcinoma of skin 08/02/2020   well diff-left chest upper   Squamous cell  carcinoma of skin 08/02/2020   well diff-mid parietal scalp (MOHS)   Squamous cell carcinoma of skin 08/02/2020   well diff-right foot inner(txpbx)   Squamous cell carcinoma of skin 08/02/2020   well diff- left lower leg medial (txpbx)   Squamous cell carcinoma of skin 08/02/2020   well diff-left lower leg anterior (txpbx)   Squamous cell carcinoma of skin 08/02/2020   well diff-left lower leg medial (txpbx)   Squamous cell carcinoma of skin 08/02/2020   well diff-right forearm-posterior (txpbx)   Past Surgical History:  Procedure Laterality Date   ABDOMINAL HERNIA REPAIR     Patient's states that she  has had 8- 9 hernia surgeries   ABDOMINAL HYSTERECTOMY     BIOPSY  02/15/2021   Procedure: BIOPSY;  Surgeon: Rogene Houston, MD;  [facility];  Service: Endoscopy;;   CATARACT EXTRACTION W/PHACO Right 01/15/2020   Procedure: CATARACT EXTRACTION PHACO AND INTRAOCULAR LENS PLACEMENT ([place]);  Surgeon: Baruch Goldmann, MD;  Location: [facility];  Service: Ophthalmology;  Laterality: Right;  CDE: 7.89   CATARACT EXTRACTION W/PHACO Left 01/29/2020   Procedure: CATARACT EXTRACTION PHACO AND INTRAOCULAR LENS PLACEMENT ([facility]) (CDE: 6.33);  Surgeon: Baruch Goldmann, MD;  Location: [facility];  Service: Ophthalmology;  Laterality: Left;   CHOLECYSTECTOMY  2007   COLON RESECTION     COLON SURGERY  2008   Done at [facility]   COLONOSCOPY     Done at [facility]   ESOPHAGOGASTRODUODENOSCOPY N/A 08/21/2018   Procedure: ESOPHAGOGASTRODUODENOSCOPY (EGD);  Surgeon: Rogene Houston, MD;  [facility];  Service: Endoscopy;  Laterality: N/A;   ESOPHAGOGASTRODUODENOSCOPY (EGD) WITH PROPOFOL N/A 12/16/2019   Procedure: ESOPHAGOGASTRODUODENOSCOPY (EGD) WITH PROPOFOL;  Surgeon: Rogene Houston, MD;  [facility];  Service: Endoscopy;  Laterality: N/A;   EYE SURGERY     lasix   FLEXIBLE SIGMOIDOSCOPY N/A 10/20/2015   Procedure: FLEXIBLE SIGMOIDOSCOPY;  Surgeon: Rogene Houston, MD;  [facility];  Service: Endoscopy;  Laterality: N/A;  23 - Dr Laural Golden has meeting until 1:00   FLEXIBLE SIGMOIDOSCOPY N/A 07/11/2016   Procedure: FLEXIBLE SIGMOIDOSCOPY;  Surgeon: Rogene Houston, MD;  [facility];  Service: Endoscopy;  Laterality: N/A;  [place] N/A 08/09/2017   Procedure: FLEXIBLE SIGMOIDOSCOPY;  Surgeon: Rogene Houston, MD;  [facility];  Service: Endoscopy;  Laterality: N/A;  1:00   FLEXIBLE SIGMOIDOSCOPY N/A 08/21/2018   Procedure: FLEXIBLE SIGMOIDOSCOPY;  Surgeon: Rogene Houston, MD;  [facility];  Service: Endoscopy;  Laterality: N/A;   FLEXIBLE SIGMOIDOSCOPY N/A 12/16/2019   Procedure: FLEXIBLE SIGMOIDOSCOPY wirh Propofol;  Surgeon: Rogene Houston, MD;  [facility];  Service: Endoscopy;  Laterality: N/A;  7:30   FLEXIBLE SIGMOIDOSCOPY N/A 02/15/2021   Procedure: FLEXIBLE SIGMOIDOSCOPY WITH PROPOFOL;  Surgeon: Rogene Houston, MD;  [facility];  Service: Endoscopy;  Laterality: N/A;  am   FRACTURE SURGERY     right wrist metal plate   HERNIA REPAIR     LIVER TRANSPLANT  [id number]   multiple skin cancers removed     POLYPECTOMY  08/09/2017   Procedure: POLYPECTOMY;  Surgeon: Rogene Houston, MD;  [facility];  Service: Endoscopy;;  colon small bowel   REVERSE SHOULDER ARTHROPLASTY Left 07/17/2018   REVERSE SHOULDER ARTHROPLASTY Left 07/17/2018   Procedure: LEFT REVERSE SHOULDER ARTHROPLASTY;  Surgeon: Justice Britain, MD;  Location: [place];  Service: Orthopedics;  Laterality: Left;  164mn   SHOULDER CLOSED REDUCTION Left 09/27/2019   Procedure: CLOSED REDUCTION SHOULDER;  Surgeon: OParalee Cancel MD;  [facility];  Service: Orthopedics;  Laterality: Left;   SPLENECTOMY  2006   TOTAL SHOULDER REVISION Left 11/12/2019   Procedure: Revision Left Reverse Shoulder Arthroplasty with poly exchange SDD;  Surgeon: SJustice Britain MD;  Location: [facility];  Service: Orthopedics;  Laterality: Left;   1222m -SDDC   TYMPANOSTOMY TUBE PLACEMENT     UPPER GASTROINTESTINAL ENDOSCOPY     [facility]  Problem Relation Age of Onset   Prostate cancer Father  Colon cancer Father    Colon cancer Sister    Lung cancer Sister    Healthy Son    Alcohol abuse Brother    Allergic rhinitis Neg Hx    Asthma Neg Hx    Eczema Neg Hx    Urticaria Neg Hx    Social History   Socioeconomic History   Marital status: Married    Spouse name: Johnny   Number of children: 1   Years of education: 12   Highest education level: High school graduate  Occupational History   Occupation: Disability    Employer: [employer]    Comment: Multimedia programmer  Tobacco Use   Smoking status: Never   Smokeless tobacco: Never  Vaping Use   Vaping Use: Never used  Substance and Sexual Activity   Alcohol use: No    Alcohol/week: 0.0 standard drinks   Drug use: No   Sexual activity: Yes    Birth control/protection: None  Other Topics Concern   Not on file  Social History Narrative   Patient lives in a two story home with her husband. She has an adult son. She is retired from being an Web designer for 30 years.    Social Determinants of Health   Financial Resource Strain: Low Risk    Difficulty of Paying Living Expenses: Not hard at all  Food Insecurity: No Food Insecurity   Worried About Charity fundraiser in the Last Year: Never true   [place] in the Last Year: Never true  Transportation Needs: No Transportation Needs   Lack of Transportation (Medical): No   Lack of Transportation (Non-Medical): No  Physical Activity: Insufficiently Active   Days of Exercise per Week: 7 days   Minutes of Exercise per Session: 20 min  Stress: No Stress Concern Present   Feeling of Stress : Not at all  Social Connections: Socially Integrated   Frequency of Communication with Friends and Family: More than three times a week   Frequency of Social Gatherings with Friends  and Family: More than three times a week   Attends Religious Services: More than 4 times per year   Active Member of [employer] or Organizations: Yes   Attends Music therapist: More than 4 times per year   Marital Status: Married    Tobacco Counseling Counseling given: Not Answered   Clinical Intake:  Pre-visit preparation completed: Yes  Pain : No/denies pain     BMI - recorded: 23.96 Nutritional Status: BMI of 19-24  Normal Nutritional Risks: None Diabetes: No  How often do you need to have someone help you when you read instructions, pamphlets, or other written materials from your doctor or pharmacy?: 1 - Never  Diabetic? No  Interpreter Needed?: No  Information entered by :: Maynard David, LPN   Activities of Daily Living In your present state of health, do you have any difficulty performing the following activities: 06/29/2021 02/14/2021  Hearing? N N  Vision? N N  Difficulty concentrating or making decisions? N N  Walking or climbing stairs? N N  Dressing or bathing? N N  Doing errands, shopping? N N  Preparing Food and eating ? N -  Using the Toilet? N -  In the past six months, have you accidently leaked urine? N -  Do you have problems with loss of bowel control? N -  Managing your Medications? N -  Managing your Finances? N -  Housekeeping or managing your Housekeeping? N -  Some recent data might be hidden    Patient Care Team: [name], FNP as PCP - General (Family Medicine) Satira Sark, MD as PCP - Cardiology (Cardiology) Eustace Moore, MD as Consulting Physician (Neurosurgery) [name], PA-C as Physician Assistant (Dermatology) Lavonna Monarch, MD as Consulting Physician (Dermatology)  Indicate any recent Medical Services you may have received from other than [facility] providers in the past year (date may be approximate).     Assessment:   This is a routine wellness examination for Cassidy Barber.  Hearing/Vision  screen Hearing Screening - Comments:: Denies hearing difficulties  Vision Screening - Comments:: Wears reading glasses prn - up to date with annual eye exams with [facility]  Dietary issues and exercise activities discussed: Current Exercise Habits: Home exercise routine, Type of exercise: walking, Time (Minutes): 20, Frequency (Times/Week): 7, Weekly Exercise (Minutes/Week): 140, Intensity: Mild, Exercise limited by: orthopedic condition(s);neurologic condition(s)   Goals Addressed   None    Depression Screen PHQ 2/9 Scores 06/29/2021 01/06/2021 10/10/2020 06/28/2020 06/27/2020 10/19/2019 08/28/2019  PHQ - 2 Score 0 0 0 0 0 0 0    Fall Risk Fall Risk  06/29/2021 01/06/2021 10/10/2020 06/28/2020 06/27/2020  Falls in the past year? 0 0 0 0 0  Number falls in past yr: 0 - - - -  Injury with Fall? 0 - - - -  Comment - - - - -  Risk for fall due to : Impaired vision;Medication side effect - - - -  Follow up Falls prevention discussed - - - -    FALL RISK PREVENTION PERTAINING TO THE HOME:  Any stairs in or around the home? Yes  If so, are there any without handrails? No  Home free of loose throw rugs in walkways, pet beds, electrical cords, etc? Yes  Adequate lighting in your home to reduce risk of falls? Yes   ASSISTIVE DEVICES UTILIZED TO PREVENT FALLS:  Life alert? No  Use of a cane, walker or w/c? No  Grab bars in the bathroom? Yes  Shower chair or bench in shower? Yes  Elevated toilet seat or a handicapped toilet? No   TIMED UP AND GO:  Was the test performed? No . Telephonic visit.  Cognitive Function: MMSE - Mini Mental State Exam 12/12/2017  Orientation to time 5  Orientation to Place 5  Registration 3  Attention/ Calculation 5  Recall 3  Language- name 2 objects 2  Language- repeat 1  Language- follow 3 step command 3  Language- read & follow direction 1  Write a sentence 1  Copy design 0  Total score 29     6CIT Screen 06/29/2021 06/27/2020 06/23/2019  What  Year? 0 points 0 points 0 points  What month? 0 points 0 points 0 points  What time? 0 points 0 points 0 points  Count back from 20 0 points 2 points 0 points  Months in reverse 0 points 2 points 0 points  Repeat phrase 4 points 0 points 2 points  Total Score '4 4 2    '$ Immunizations Immunization History  Administered Date(s) Administered   Fluad Quad(high Dose 65+) 08/21/2019, 08/25/2020   Hepatitis A, Adult 06/28/2020, 01/02/2021   Hepatitis B, adult 02/28/2004, 03/27/2004, 08/21/2004   HiB (PRP-T) 01/15/2003   Influenza Split 08/06/2011, 09/18/2012, 09/13/2015   Influenza, High Dose Seasonal PF 07/18/2018, 08/05/2019   Influenza,inj,Quad PF,6+ Mos 08/07/2010, 07/31/2013, 08/16/2014, 09/01/2015   Influenza-Unspecified 07/13/2013, 09/01/2015, 08/27/2016, 07/26/2017   Meningococcal Polysaccharide 01/15/2003  Moderna Sars-Covid-2 Vaccination 01/07/2020, 02/05/2020, 07/13/2020   Pneumococcal Conjugate-13 09/01/2015, 07/26/2017   Pneumococcal Polysaccharide-23 01/15/2003, 08/06/2011, 07/13/2017   Pneumococcal-Unspecified 07/13/2012   Tdap 08/28/2016, 03/31/2018, 07/01/2019   Zoster Recombinat (Shingrix) 04/21/2018, 06/23/2018    TDAP status: Up to date  Flu Vaccine status: Up to date  Pneumococcal vaccine status: Up to date  Covid-19 vaccine status: Completed vaccines  Qualifies for Shingles Vaccine? Yes   Zostavax completed Yes   Shingrix Completed?: Yes  Screening Tests Health Maintenance  Topic Date Due   DEXA SCAN  12/11/2019   COVID-19 Vaccine (4 - Booster for Moderna series) 10/12/2020   INFLUENZA VACCINE  06/12/2021   MAMMOGRAM  02/24/2023   COLONOSCOPY (Pts 45-84yr Insurance coverage will need to be confirmed)  08/12/2024   COLON CANCER SCREENING 5 YEAR SIGMOIDOSCOPY  02/15/2026   TETANUS/TDAP  06/30/2029   Hepatitis C Screening  Completed   PNA vac Low Risk Adult  Completed   Zoster Vaccines- Shingrix  Completed   HPV VACCINES  Aged Out    Health  Maintenance  Health Maintenance Due  Topic Date Due   DEXA SCAN  12/11/2019   COVID-19 Vaccine (4 - Booster for Moderna series) 10/12/2020   INFLUENZA VACCINE  06/12/2021    Colorectal cancer screening: Type of screening: Sigmoidoscopy. Completed 02/15/2021. Repeat every 5 years  Mammogram status: Completed 02/23/2021. Repeat every year  Bone Density status: Completed 12/10/2017. Results reflect: Bone density results: OSTEOPOROSIS. Repeat every 2 years.  Lung Cancer Screening: (Low Dose CT Chest recommended if Age 70-80years, 30 pack-year currently smoking OR have quit w/in 15years.) does not qualify.   Additional Screening:  Hepatitis C Screening: does qualify; Completed 08/31/2013  Vision Screening: Recommended annual ophthalmology exams for early detection of glaucoma and other disorders of the eye. Is the patient up to date with their annual eye exam?  Yes  Who is the provider or what is the name of the office in which the patient attends annual eye exams? [address]If pt is not established with a provider, would they like to be referred to a provider to establish care? No .   Dental Screening: Recommended annual dental exams for proper oral hygiene  Community Resource Referral / Chronic Care Management: CRR required this visit?  No   CCM required this visit?  No      Plan:     I have personally reviewed and noted the following in the patient's chart:   Medical and social history Use of alcohol, tobacco or illicit drugs  Current medications and supplements including opioid prescriptions.  Functional ability and status Nutritional status Physical activity Advanced directives List of other physicians Hospitalizations, surgeries, and ER visits in previous 12 months Vitals Screenings to include cognitive, depression, and falls Referrals and appointments  In addition, I have reviewed and discussed with patient certain preventive protocols, quality metrics, and  best practice recommendations. A written personalized care plan for preventive services as well as general preventive health recommendations were provided to patient.     ASandrea Hammond LPN   [id number]  Nurse Notes: None

## 2021-06-30 ENCOUNTER — Encounter (HOSPITAL_COMMUNITY): Payer: Self-pay

## 2021-06-30 NOTE — Patient Instructions (Signed)
DUE TO COVID-19 ONLY ONE VISITOR IS ALLOWED TO COME WITH YOU AND STAY IN THE WAITING ROOM ONLY DURING PRE OP AND PROCEDURE DAY OF SURGERY. THE 1 VISITOR  MAY VISIT WITH YOU AFTER SURGERY IN YOUR PRIVATE ROOM DURING VISITING HOURS ONLY!                Cassidy Barber     Your procedure is scheduled on: 07/20/21   Report to [facility] Main  Entrance   Report to admitting at 9:45 AM     Call this number if you have problems the morning of surgery Cassidy Barber, NO [place].   No food after midnight.    You may have clear liquid until 9:30 AM.    At 9:00 AM drink pre surgery drink.   Nothing by mouth after 9:30 AM.     Take these medicines the morning of surgery with A SIP OF WATER:  Gabapentin, Metoprolol, Levothyroxine, Protonix, Zortress                                 You may not have any metal on your body including hair pins and              piercings  Do not wear jewelry, make-up, lotions, powders or perfumes, deodorant             Do not wear nail polish on your fingernails.  Do not shave  48 hours prior to surgery.               Do not bring valuables to the hospital. Cassidy Barber.  Contacts, dentures or bridgework may not be worn into surgery.    Patients discharged the day of surgery will not be allowed to drive home.   IF YOU ARE HAVING SURGERY AND GOING HOME THE SAME DAY, YOU MUST HAVE AN ADULT TO DRIVE YOU HOME AND BE WITH YOU FOR 24 HOURS.  YOU MAY GO HOME BY TAXI OR UBER OR ORTHERWISE, BUT AN ADULT MUST ACCOMPANY YOU HOME AND STAY WITH YOU FOR 24 HOURS.  Name and phone number of your driver:  Special Instructions: N/A              Please read over the following fact sheets you were given: _____________________________________________________________________  [facility]- Preparing for Total Shoulder Arthroplasty    Before  surgery, you can play an important role. Because skin is not sterile, your skin needs to be as free of germs as possible. You can reduce the number of germs on your skin by using the following products. Benzoyl Peroxide Gel Reduces the number of germs present on the skin Applied twice a day to shoulder area starting two days before surgery    ==================================================================  Please follow these instructions carefully:  BENZOYL PEROXIDE 5% GEL  Please do not use if you have an allergy to benzoyl peroxide.   If your skin becomes reddened/irritated stop using the benzoyl peroxide.  Starting two days before surgery, apply as follows: Apply benzoyl peroxide in the morning and at night. Apply after taking a shower. If you are not taking a shower clean entire shoulder front, back, and side along with the armpit with  a clean wet washcloth.  Place a quarter-sized dollop on your shoulder and rub in thoroughly, making sure to cover the front, back, and side of your shoulder, along with the armpit.   2 days before ____ AM   ____ PM              1 day before ____ AM   ____ PM                         Do this twice a day for two days.  (Last application is the night before surgery, AFTER using the CHG soap as described below).  Do NOT apply benzoyl peroxide gel on the day of surgery.              - Preparing for Surgery  Before surgery, you can play an important role.  Because skin is not sterile, your skin needs to be as free of germs as possible.  You can reduce the number of germs on your skin by washing with CHG (chlorahexidine gluconate) soap before surgery.  CHG is an antiseptic cleaner which kills germs and bonds with the skin to continue killing germs even after washing. Please DO NOT use if you have an allergy to CHG or antibacterial soaps.  If your skin becomes reddened/irritated stop using the CHG and inform your nurse when you arrive at Short  Stay. Do not shave (including legs and underarms) for at least 48 hours prior to the first CHG shower.  Please follow these instructions carefully:  1.  Shower with CHG Soap the night before surgery and the  morning of Surgery.  2.  If you choose to wash your hair, wash your hair first as usual with your  normal  shampoo.  3.  After you shampoo, rinse your hair and body thoroughly to remove the  shampoo.                             4.  Use CHG as you would any other liquid soap.  You can apply chg directly  to the skin and wash                       Gently with a scrungie or clean washcloth.  5.  Apply the CHG Soap to your body ONLY FROM THE NECK DOWN.   Do not use on face/ open                           Wound or open sores. Avoid contact with eyes, ears mouth and genitals (private parts).                       Wash face,  Genitals (private parts) with your normal soap.             6.  Wash thoroughly, paying special attention to the area where your surgery  will be performed.  7.  Thoroughly rinse your body with warm water from the neck down.  8.  DO NOT shower/wash with your normal soap after using and rinsing off  the CHG Soap.             9.  Pat yourself dry with a clean towel.            10.  Wear clean pajamas.  11.  Place clean sheets on your bed the night of your first shower and do not  sleep with pets. Day of Surgery : Do not apply any lotions/deodorants the morning of surgery.  Please wear clean clothes to the hospital/surgery center.  FAILURE TO FOLLOW THESE INSTRUCTIONS MAY RESULT IN THE CANCELLATION OF YOUR SURGERY PATIENT SIGNATURE_________________________________  NURSE SIGNATURE__________________________________  ________________________________________________________________________   Cassidy Barber  An incentive spirometer is a tool that can help keep your lungs clear and active. This tool measures how well you are filling your lungs with each breath.  Taking long deep breaths may help reverse or decrease the chance of developing breathing (pulmonary) problems (especially infection) following: A long period of time when you are unable to move or be active. BEFORE THE PROCEDURE  If the spirometer includes an indicator to show your best effort, your nurse or respiratory therapist will set it to a desired goal. If possible, sit up straight or lean slightly forward. Try not to slouch. Hold the incentive spirometer in an upright position. INSTRUCTIONS FOR USE  Sit on the edge of your bed if possible, or sit up as far as you can in bed or on a chair. Hold the incentive spirometer in an upright position. Breathe out normally. Place the mouthpiece in your mouth and seal your lips tightly around it. Breathe in slowly and as deeply as possible, raising the piston or the ball toward the top of the column. Hold your breath for 3-5 seconds or for as long as possible. Allow the piston or ball to fall to the bottom of the column. Remove the mouthpiece from your mouth and breathe out normally. Rest for a few seconds and repeat Steps 1 through 7 at least 10 times every 1-2 hours when you are awake. Take your time and take a few normal breaths between deep breaths. The spirometer may include an indicator to show your best effort. Use the indicator as a goal to work toward during each repetition. After each set of 10 deep breaths, practice coughing to be sure your lungs are clear. If you have an incision (the cut made at the time of surgery), support your incision when coughing by placing a pillow or rolled up towels firmly against it. Once you are able to get out of bed, walk around indoors and cough well. You may stop using the incentive spirometer when instructed by your caregiver.  RISKS AND COMPLICATIONS Take your time so you do not get dizzy or light-headed. If you are in pain, you may need to take or ask for pain medication before doing incentive  spirometry. It is harder to take a deep breath if you are having pain. AFTER USE Rest and breathe slowly and easily. It can be helpful to keep track of a log of your progress. Your caregiver can provide you with a simple table to help with this. If you are using the spirometer at home, follow these instructions: [place] IF:  You are having difficultly using the spirometer. You have trouble using the spirometer as often as instructed. Your pain medication is not giving enough relief while using the spirometer. You develop fever of 100.5 F (38.1 C) or higher. SEEK IMMEDIATE MEDICAL CARE IF:  You cough up bloody sputum that had not been present before. You develop fever of 102 F (38.9 C) or greater. You develop worsening pain at or near the incision site. MAKE SURE YOU:  Understand these instructions. Will watch your condition. Will  get help right away if you are not doing well or get worse. Document Released: 03/11/2007 Document Revised: 01/21/2012 Document Reviewed: 05/12/2007 [facility] Patient Information 2014 [place], [place].   ________________________________________________________________________

## 2021-07-03 ENCOUNTER — Encounter (HOSPITAL_COMMUNITY)
Admission: RE | Admit: 2021-07-03 | Discharge: 2021-07-03 | Disposition: A | Discharge status: Home / Self Care | Payer: Medicare Other | Source: Ambulatory Visit | Attending: Orthopedic Surgery | Admitting: Orthopedic Surgery

## 2021-07-03 ENCOUNTER — Encounter (HOSPITAL_COMMUNITY): Payer: Self-pay

## 2021-07-03 ENCOUNTER — Other Ambulatory Visit: Payer: Self-pay

## 2021-07-03 DIAGNOSIS — Z01812 Encounter for preprocedural laboratory examination: Secondary | ICD-10-CM | POA: Diagnosis not present

## 2021-07-03 LAB — CBC
HCT: 35.5 % — ABNORMAL LOW (ref 36.0–46.0)
Hemoglobin: 10.8 g/dL — ABNORMAL LOW (ref 12.0–15.0)
MCH: 29.9 pg (ref 26.0–34.0)
MCHC: 30.4 g/dL (ref 30.0–36.0)
MCV: 98.3 fL (ref 80.0–100.0)
Platelets: 341 10*3/uL (ref 150–400)
RBC: 3.61 MIL/uL — ABNORMAL LOW (ref 3.87–5.11)
RDW: 14.1 % (ref 11.5–15.5)
WBC: 7.6 10*3/uL (ref 4.0–10.5)
nRBC: 0 % (ref 0.0–0.2)

## 2021-07-03 LAB — BASIC METABOLIC PANEL WITH GFR
Anion gap: 6 (ref 5–15)
BUN: 17 mg/dL (ref 8–23)
CO2: 28 mmol/L (ref 22–32)
Calcium: 8.7 mg/dL — ABNORMAL LOW (ref 8.9–10.3)
Chloride: 106 mmol/L (ref 98–111)
Creatinine, Ser: 0.82 mg/dL (ref 0.44–1.00)
GFR, Estimated: >60 mL/min
Glucose, Bld: 76 mg/dL (ref 70–99)
Potassium: 3.9 mmol/L (ref 3.5–5.1)
Sodium: 140 mmol/L (ref 135–145)

## 2021-07-03 LAB — SURGICAL PCR SCREEN
MRSA, PCR: NEGATIVE
Staphylococcus aureus: NEGATIVE

## 2021-07-03 NOTE — Progress Notes (Signed)
  Date COVID test  NA  PCP - Dr. [name] LOV will be 07/06/21 Cardiologist - none  Chest x-ray - no EKG - 02/14/21-epic Stress Test - 07/24/11-epic ECHO - 11/26/19-epic Cardiac Cath - no Pacemaker/ICD device last checked:NA  Sleep Study - no CPAP -   Fasting Blood Sugar - NA Checks Blood Sugar _____ times a day  Blood Thinner Instructions:ASA 81/ C. Hawks  FNP Aspirin Instructions:Pt will ask at visit and call Dr. Onnie Graham Last Dose:  Anesthesia review: yes  Patient denies shortness of breath, fever, cough and chest pain at PAT appointment Pt has no SOB with any activities. She has a skin CA on Rt shin that has slight drainage. Pt said that she will call Dr. Onnie Graham and let him know.  Patient verbalized understanding of instructions that were given to them at the PAT appointment. Patient was also instructed that they will need to review over the PAT instructions again at home before surgery. NA

## 2021-07-05 ENCOUNTER — Other Ambulatory Visit: Payer: Self-pay | Admitting: Family

## 2021-07-05 DIAGNOSIS — K219 Gastro-esophageal reflux disease without esophagitis: Secondary | ICD-10-CM

## 2021-07-05 DIAGNOSIS — R1013 Epigastric pain: Secondary | ICD-10-CM

## 2021-07-06 ENCOUNTER — Other Ambulatory Visit: Payer: Self-pay | Admitting: Family

## 2021-07-06 ENCOUNTER — Ambulatory Visit (INDEPENDENT_AMBULATORY_CARE_PROVIDER_SITE_OTHER): Payer: Medicare Other | Admitting: Family

## 2021-07-06 ENCOUNTER — Encounter: Payer: Self-pay | Admitting: Family

## 2021-07-06 ENCOUNTER — Other Ambulatory Visit: Payer: Self-pay

## 2021-07-06 VITALS — BP 119/74 | HR 86 | Temp 97.8°F | Ht 61.0 in | Wt 140.6 lb

## 2021-07-06 DIAGNOSIS — K529 Noninfective gastroenteritis and colitis, unspecified: Secondary | ICD-10-CM

## 2021-07-06 DIAGNOSIS — L309 Dermatitis, unspecified: Secondary | ICD-10-CM | POA: Diagnosis not present

## 2021-07-06 DIAGNOSIS — E559 Vitamin D deficiency, unspecified: Secondary | ICD-10-CM

## 2021-07-06 DIAGNOSIS — F411 Generalized anxiety disorder: Secondary | ICD-10-CM

## 2021-07-06 DIAGNOSIS — E039 Hypothyroidism, unspecified: Secondary | ICD-10-CM

## 2021-07-06 DIAGNOSIS — F1320 Sedative, hypnotic or anxiolytic dependence, uncomplicated: Secondary | ICD-10-CM

## 2021-07-06 DIAGNOSIS — R350 Frequency of micturition: Secondary | ICD-10-CM | POA: Diagnosis not present

## 2021-07-06 DIAGNOSIS — K219 Gastro-esophageal reflux disease without esophagitis: Secondary | ICD-10-CM

## 2021-07-06 DIAGNOSIS — D509 Iron deficiency anemia, unspecified: Secondary | ICD-10-CM

## 2021-07-06 DIAGNOSIS — D849 Immunodeficiency, unspecified: Secondary | ICD-10-CM

## 2021-07-06 DIAGNOSIS — R1013 Epigastric pain: Secondary | ICD-10-CM

## 2021-07-06 DIAGNOSIS — Z79899 Other long term (current) drug therapy: Secondary | ICD-10-CM

## 2021-07-06 DIAGNOSIS — Z944 Liver transplant status: Secondary | ICD-10-CM

## 2021-07-06 DIAGNOSIS — G4700 Insomnia, unspecified: Secondary | ICD-10-CM

## 2021-07-06 DIAGNOSIS — M5432 Sciatica, left side: Secondary | ICD-10-CM

## 2021-07-06 DIAGNOSIS — I10 Essential (primary) hypertension: Secondary | ICD-10-CM

## 2021-07-06 LAB — URINALYSIS, COMPLETE
Bilirubin, UA: NEGATIVE
Glucose, UA: NEGATIVE
Ketones, UA: NEGATIVE
Nitrite, UA: NEGATIVE
Protein,UA: NEGATIVE
Specific Gravity, UA: 1.015 (ref 1.005–1.030)
Urobilinogen, Ur: 0.2 mg/dL (ref 0.2–1.0)
pH, UA: 5 (ref 5.0–7.5)

## 2021-07-06 LAB — MICROSCOPIC EXAMINATION: RBC, Urine: NONE SEEN /hpf (ref 0–2)

## 2021-07-06 MED ORDER — ZORTRESS 0.5 MG PO TABS: 2.0000 mg | ORAL_TABLET | Freq: Two times a day (BID) | ORAL | 2 refills | Status: DC

## 2021-07-06 MED ORDER — PANTOPRAZOLE SODIUM 40 MG PO TBEC: 40.0000 mg | DELAYED_RELEASE_TABLET | Freq: Two times a day (BID) | ORAL | 2 refills | Status: DC

## 2021-07-06 MED ORDER — CEPHALEXIN 500 MG PO CAPS: 500.0000 mg | ORAL_CAPSULE | Freq: Two times a day (BID) | ORAL | 0 refills | Status: DC

## 2021-07-06 MED ORDER — LEVOTHYROXINE SODIUM 112 MCG PO TABS: 112.0000 ug | ORAL_TABLET | Freq: Every day | ORAL | 4 refills | Status: DC

## 2021-07-06 MED ORDER — GABAPENTIN 100 MG PO CAPS: 100.0000 mg | ORAL_CAPSULE | Freq: Every day | ORAL | 2 refills | Status: DC

## 2021-07-06 MED ORDER — METOPROLOL SUCCINATE ER 25 MG PO TB24: 25.0000 mg | ORAL_TABLET | Freq: Every day | ORAL | 4 refills | Status: DC

## 2021-07-06 MED ORDER — ALPRAZOLAM 0.5 MG PO TABS: 0.5000 mg | ORAL_TABLET | Freq: Every day | ORAL | 1 refills | Status: DC

## 2021-07-06 MED ORDER — VITAMIN D (ERGOCALCIFEROL) 1.25 MG (50000 UNIT) PO CAPS: 50000.0000 [IU] | ORAL_CAPSULE | ORAL | 3 refills | Status: DC

## 2021-07-06 MED ORDER — LOSARTAN POTASSIUM 25 MG PO TABS: 25.0000 mg | ORAL_TABLET | Freq: Every day | ORAL | 4 refills | Status: DC

## 2021-07-06 NOTE — Patient Instructions (Signed)

## 2021-07-06 NOTE — Progress Notes (Addendum)
Subjective:    Patient ID: Cassidy Barber, female    DOB: 12/13/1950, 70 y.o.   MRN: [id number]  Chief Complaint  Patient presents with   Medical Management of Chronic Issues   PT presents to the office today for chronic follow up. She is followed by [facility] for hx of liver transplant. She had an incarcerated ventral hernia with fluid in the hernia sac 04/05/18. She is followed by Hemologists every 3 months for iron deficiency anemia.   She had a left reverse shoulder 07/17/18 and then had to redone in 09/27/19. She is scheduled for reverse shoulder arthroplasty on 07/20/21 on her right shoulder.   She currently has skin cancer on her scalp and is completed 30 radiation treatments then had 20 radiation treatments on another.  She is followed by dermatologists for recurrent skin cancer. Hypertension This is a chronic problem. The current episode started more than 1 year ago. The problem has been resolved since onset. The problem is controlled. Associated symptoms include anxiety, malaise/fatigue and peripheral edema ("some times"). Pertinent negatives include no shortness of breath. Risk factors for coronary artery disease include dyslipidemia and obesity. The current treatment provides moderate improvement. Identifiable causes of hypertension include a thyroid problem.  Gastroesophageal Reflux She complains of belching, heartburn and a hoarse voice. She reports no nausea. This is a chronic problem. The current episode started more than 1 year ago. The problem occurs occasionally. Associated symptoms include fatigue. She has tried a PPI for the symptoms. The treatment provided moderate relief.  Thyroid Problem Presents for follow-up visit. Symptoms include anxiety, depressed mood, diarrhea, fatigue and hoarse voice. Patient reports no constipation. The symptoms have been stable.  Anemia Presents for follow-up visit. Symptoms include malaise/fatigue.  Insomnia Primary symptoms: difficulty falling  asleep, frequent awakening, malaise/fatigue.   The current episode started more than one year. The onset quality is gradual. The problem occurs intermittently.  Anxiety Presents for follow-up visit. Symptoms include depressed mood, excessive worry, insomnia, irritability and nervous/anxious behavior. Patient reports no nausea or shortness of breath. The severity of symptoms is moderate.   Her past medical history is significant for anemia.  Urinary Frequency  This is a new problem. The current episode started more than 1 month ago. The problem occurs intermittently. The problem has been waxing and waning. The pain is at a severity of 0/10. The patient is experiencing no pain. Associated symptoms include frequency, hesitancy and urgency. Pertinent negatives include no hematuria, nausea or vomiting. She has tried increased fluids (AZO) for the symptoms. The treatment provided mild relief.     Review of Systems  Constitutional:  Positive for fatigue, irritability and malaise/fatigue.  HENT:  Positive for hoarse voice.   Respiratory:  Negative for shortness of breath.   Gastrointestinal:  Positive for diarrhea and heartburn. Negative for constipation, nausea and vomiting.  Genitourinary:  Positive for frequency, hesitancy and urgency. Negative for hematuria.  Psychiatric/Behavioral:  The patient is nervous/anxious and has insomnia.   All other systems reviewed and are negative.     Objective:   Physical Exam Vitals reviewed.  Constitutional:      General: She is not in acute distress.    Appearance: She is well-developed. She is obese.  HENT:     Head: Normocephalic and atraumatic.     Right Ear: Tympanic membrane normal.     Left Ear: Tympanic membrane normal.  Eyes:     Pupils: Pupils are equal, round, and reactive to light.  Neck:  Thyroid: No thyromegaly.  Cardiovascular:     Rate and Rhythm: Normal rate and regular rhythm.     Heart sounds: Normal heart sounds. No murmur  heard. Pulmonary:     Effort: Pulmonary effort is normal. No respiratory distress.     Breath sounds: Normal breath sounds. No wheezing.  Abdominal:     General: Bowel sounds are normal. There is no distension.     Palpations: Abdomen is soft.     Tenderness: There is no abdominal tenderness.  Musculoskeletal:        General: Tenderness present.     Cervical back: Normal range of motion and neck supple.     Comments: Decrease rom of right shoulder with abduction   Skin:    General: Skin is warm and dry.  Neurological:     Mental Status: She is alert and oriented to person, place, and time.     Cranial Nerves: No cranial nerve deficit.     Deep Tendon Reflexes: Reflexes are normal and symmetric.  Psychiatric:        Behavior: Behavior normal.        Thought Content: Thought content normal.        Judgment: Judgment normal.      BP 119/74   Pulse 86   Temp 97.8 F (36.6 C) (Temporal)   Ht '5\' 1"'$  (1.549 m)   Wt 140 lb 9.6 oz (63.8 kg)   BMI 26.57 kg/m      Assessment & Plan:  Cassidy Barber comes in today with chief complaint of Medical Management of Chronic Issues   Diagnosis and orders addressed:  1. Urine frequency - Urinalysis, Complete - Urine Culture  2. Dermatitis  3. Benzodiazepine dependence (HCC) - ALPRAZolam (XANAX) 0.5 MG tablet; Take 1 tablet (0.5 mg total) by mouth at bedtime.  Dispense: 90 tablet; Refill: 1 - ToxASSURE Select 13 (MW), Urine  4. Controlled substance agreement signed - ALPRAZolam (XANAX) 0.5 MG tablet; Take 1 tablet (0.5 mg total) by mouth at bedtime.  Dispense: 90 tablet; Refill: 1 - ToxASSURE Select 13 (MW), Urine  5. GAD (generalized anxiety disorder) - ALPRAZolam (XANAX) 0.5 MG tablet; Take 1 tablet (0.5 mg total) by mouth at bedtime.  Dispense: 90 tablet; Refill: 1  6. Primary hypertension  7. Chronic diarrhea  8. Acquired hypothyroidism  9. Iron deficiency anemia, unspecified iron deficiency anemia type   10.  Immunosuppression ([place])  11. Hx of liver transplant ([place])   12. Insomnia, unspecified type   13. Vitamin D deficiency   14. Liver transplant recipient [facility])  15. Epigastric pain  - pantoprazole (PROTONIX) 40 MG tablet; Take 1 tablet (40 mg total) by mouth 2 (two) times daily.  Dispense: 180 tablet; Refill: 2  16. Gastroesophageal reflux disease, unspecified whether esophagitis present  - pantoprazole (PROTONIX) 40 MG tablet; Take 1 tablet (40 mg total) by mouth 2 (two) times daily.  Dispense: 180 tablet; Refill: 2  17. Essential hypertension, benign  - metoprolol succinate (TOPROL XL) 25 MG 24 hr tablet; Take 1 tablet (25 mg total) by mouth daily.  Dispense: 90 tablet; Refill: 4 - losartan (COZAAR) 25 MG tablet; Take 1 tablet (25 mg total) by mouth daily.  Dispense: 90 tablet; Refill: 4  18. Left sciatic nerve pain  - gabapentin (NEURONTIN) 100 MG capsule; Take 1 capsule (100 mg total) by mouth daily.  Dispense: 90 capsule; Refill: 2   Labs pending Patient reviewed in [place] controlled database, no flags noted. Contract and  drug screen are up to date.  Health Maintenance reviewed Diet and exercise encouraged  Follow up plan: 6 months and keep follow up with specialists    [name], FNP

## 2021-07-08 DIAGNOSIS — Z944 Liver transplant status: Secondary | ICD-10-CM | POA: Diagnosis not present

## 2021-07-09 LAB — URINE CULTURE

## 2021-07-10 ENCOUNTER — Other Ambulatory Visit: Payer: Self-pay | Admitting: Family

## 2021-07-11 LAB — TOXASSURE SELECT 13 (MW), URINE

## 2021-07-18 ENCOUNTER — Encounter: Payer: Medicare Other | Admitting: Physician Assistant

## 2021-07-20 ENCOUNTER — Ambulatory Visit (HOSPITAL_COMMUNITY): Payer: Medicare Other | Admitting: Physician Assistant

## 2021-07-20 ENCOUNTER — Encounter (HOSPITAL_COMMUNITY): Payer: Self-pay | Admitting: Orthopedic Surgery

## 2021-07-20 ENCOUNTER — Encounter (HOSPITAL_COMMUNITY)
Admission: RE | Disposition: A | Discharge status: Home / Self Care | Payer: Self-pay | Source: Ambulatory Visit | Attending: Orthopedic Surgery

## 2021-07-20 ENCOUNTER — Ambulatory Visit (HOSPITAL_COMMUNITY): Payer: Medicare Other | Admitting: Certified Registered Nurse Anesthetist

## 2021-07-20 ENCOUNTER — Ambulatory Visit (HOSPITAL_COMMUNITY)
Admission: RE | Admit: 2021-07-20 | Discharge: 2021-07-20 | Disposition: A | Discharge status: Home / Self Care | Payer: Medicare Other | Source: Ambulatory Visit | Attending: Orthopedic Surgery | Admitting: Orthopedic Surgery

## 2021-07-20 ENCOUNTER — Telehealth: Payer: Self-pay | Admitting: Family

## 2021-07-20 DIAGNOSIS — G8918 Other acute postprocedural pain: Secondary | ICD-10-CM | POA: Diagnosis not present

## 2021-07-20 DIAGNOSIS — Z79890 Hormone replacement therapy (postmenopausal): Secondary | ICD-10-CM | POA: Diagnosis not present

## 2021-07-20 DIAGNOSIS — Z7983 Long term (current) use of bisphosphonates: Secondary | ICD-10-CM | POA: Diagnosis not present

## 2021-07-20 DIAGNOSIS — Z1509 Genetic susceptibility to other malignant neoplasm: Secondary | ICD-10-CM | POA: Insufficient documentation

## 2021-07-20 DIAGNOSIS — Z85828 Personal history of other malignant neoplasm of skin: Secondary | ICD-10-CM | POA: Insufficient documentation

## 2021-07-20 DIAGNOSIS — Z85038 Personal history of other malignant neoplasm of large intestine: Secondary | ICD-10-CM | POA: Insufficient documentation

## 2021-07-20 DIAGNOSIS — Z79899 Other long term (current) drug therapy: Secondary | ICD-10-CM | POA: Diagnosis not present

## 2021-07-20 DIAGNOSIS — M75101 Unspecified rotator cuff tear or rupture of right shoulder, not specified as traumatic: Secondary | ICD-10-CM | POA: Diagnosis not present

## 2021-07-20 DIAGNOSIS — Z944 Liver transplant status: Secondary | ICD-10-CM | POA: Insufficient documentation

## 2021-07-20 DIAGNOSIS — M129 Arthropathy, unspecified: Secondary | ICD-10-CM | POA: Diagnosis not present

## 2021-07-20 DIAGNOSIS — Z7982 Long term (current) use of aspirin: Secondary | ICD-10-CM | POA: Insufficient documentation

## 2021-07-20 DIAGNOSIS — M12811 Other specific arthropathies, not elsewhere classified, right shoulder: Secondary | ICD-10-CM | POA: Diagnosis not present

## 2021-07-20 DIAGNOSIS — M19011 Primary osteoarthritis, right shoulder: Secondary | ICD-10-CM | POA: Diagnosis not present

## 2021-07-20 DIAGNOSIS — E559 Vitamin D deficiency, unspecified: Secondary | ICD-10-CM | POA: Diagnosis not present

## 2021-07-20 HISTORY — PX: REVERSE SHOULDER ARTHROPLASTY: SHX5054

## 2021-07-20 SURGERY — ARTHROPLASTY, SHOULDER, TOTAL, REVERSE
Anesthesia: General | Site: Shoulder | Laterality: Right

## 2021-07-20 MED ORDER — FENTANYL CITRATE PF 50 MCG/ML IJ SOSY
PREFILLED_SYRINGE | INTRAMUSCULAR | Status: AC
  Administered 2021-07-20: 50 ug via INTRAVENOUS
  Filled 2021-07-20: qty 1

## 2021-07-20 MED ORDER — CEFAZOLIN SODIUM-DEXTROSE 2-4 GM/100ML-% IV SOLN
INTRAVENOUS | Status: AC
  Filled 2021-07-20: qty 100

## 2021-07-20 MED ORDER — CEFAZOLIN SODIUM-DEXTROSE 2-4 GM/100ML-% IV SOLN
2.0000 g | INTRAVENOUS | Status: AC
Start: 2021-07-20 — End: 2021-07-20
  Administered 2021-07-20: 2 g via INTRAVENOUS
  Filled 2021-07-20: qty 100

## 2021-07-20 MED ORDER — CYCLOBENZAPRINE HCL 10 MG PO TABS: 10.0000 mg | ORAL_TABLET | Freq: Three times a day (TID) | ORAL | 1 refills | Status: DC | PRN

## 2021-07-20 MED ORDER — FENTANYL CITRATE (PF) 100 MCG/2ML IJ SOLN
INTRAMUSCULAR | Status: AC
  Filled 2021-07-20: qty 2

## 2021-07-20 MED ORDER — PROPOFOL 10 MG/ML IV BOLUS
INTRAVENOUS | Status: AC
  Filled 2021-07-20: qty 20

## 2021-07-20 MED ORDER — DEXAMETHASONE SODIUM PHOSPHATE 10 MG/ML IJ SOLN
INTRAMUSCULAR | Status: AC
  Filled 2021-07-20: qty 1

## 2021-07-20 MED ORDER — LIDOCAINE 2% (20 MG/ML) 5 ML SYRINGE
INTRAMUSCULAR | Status: AC
  Filled 2021-07-20: qty 5

## 2021-07-20 MED ORDER — PHENYLEPHRINE HCL (PRESSORS) 10 MG/ML IV SOLN
INTRAVENOUS | Status: AC
  Filled 2021-07-20: qty 1

## 2021-07-20 MED ORDER — MIDAZOLAM HCL 2 MG/2ML IJ SOLN: 1.0000 mg | INTRAMUSCULAR | Status: DC

## 2021-07-20 MED ORDER — ROCURONIUM BROMIDE 10 MG/ML (PF) SYRINGE
PREFILLED_SYRINGE | INTRAVENOUS | Status: DC | PRN
  Administered 2021-07-20: 60 mg via INTRAVENOUS

## 2021-07-20 MED ORDER — HYDROMORPHONE HCL 2 MG PO TABS: 2.0000 mg | ORAL_TABLET | ORAL | 0 refills | Status: DC | PRN

## 2021-07-20 MED ORDER — PHENYLEPHRINE HCL-NACL 20-0.9 MG/250ML-% IV SOLN
INTRAVENOUS | Status: DC | PRN
  Administered 2021-07-20: 50 ug/min via INTRAVENOUS

## 2021-07-20 MED ORDER — TRANEXAMIC ACID-NACL 1000-0.7 MG/100ML-% IV SOLN
1000.0000 mg | INTRAVENOUS | Status: AC
  Administered 2021-07-20: 1000 mg via INTRAVENOUS
  Filled 2021-07-20: qty 100

## 2021-07-20 MED ORDER — OXYCODONE HCL 5 MG/5ML PO SOLN: 5.0000 mg | Freq: Once | ORAL | Status: DC | PRN

## 2021-07-20 MED ORDER — FENTANYL CITRATE (PF) 100 MCG/2ML IJ SOLN
INTRAMUSCULAR | Status: DC | PRN
  Administered 2021-07-20: 100 ug via INTRAVENOUS

## 2021-07-20 MED ORDER — OXYCODONE HCL 5 MG PO TABS
5.0000 mg | ORAL_TABLET | Freq: Once | ORAL | Status: DC | PRN
Start: 2021-07-20 — End: 2021-07-20

## 2021-07-20 MED ORDER — PHENYLEPHRINE 40 MCG/ML (10ML) SYRINGE FOR IV PUSH (FOR BLOOD PRESSURE SUPPORT)
PREFILLED_SYRINGE | INTRAVENOUS | Status: DC | PRN
  Administered 2021-07-20: 120 ug via INTRAVENOUS
  Administered 2021-07-20: 200 ug via INTRAVENOUS

## 2021-07-20 MED ORDER — ONDANSETRON HCL 4 MG/2ML IJ SOLN: 4.0000 mg | Freq: Once | INTRAMUSCULAR | Status: DC | PRN

## 2021-07-20 MED ORDER — ORAL CARE MOUTH RINSE: 15.0000 mL | Freq: Once | OROMUCOSAL | Status: AC

## 2021-07-20 MED ORDER — BUPIVACAINE LIPOSOME 1.3 % IJ SUSP
INTRAMUSCULAR | Status: DC | PRN
  Administered 2021-07-20: 10 mL via PERINEURAL

## 2021-07-20 MED ORDER — LACTATED RINGERS IV BOLUS
250.0000 mL | Freq: Once | INTRAVENOUS | Status: AC
Start: 2021-07-20 — End: 2021-07-20
  Administered 2021-07-20: 250 mL via INTRAVENOUS

## 2021-07-20 MED ORDER — TRANEXAMIC ACID 1000 MG/10ML IV SOLN: 1000.0000 mg | INTRAVENOUS | Status: DC

## 2021-07-20 MED ORDER — PROPOFOL 10 MG/ML IV BOLUS
INTRAVENOUS | Status: DC | PRN
  Administered 2021-07-20: 120 mg via INTRAVENOUS

## 2021-07-20 MED ORDER — CHLORHEXIDINE GLUCONATE 0.12 % MT SOLN
15.0000 mL | Freq: Once | OROMUCOSAL | Status: AC
  Administered 2021-07-20: 15 mL via OROMUCOSAL

## 2021-07-20 MED ORDER — ONDANSETRON HCL 4 MG PO TABS: 4.0000 mg | ORAL_TABLET | Freq: Three times a day (TID) | ORAL | 0 refills | Status: DC | PRN

## 2021-07-20 MED ORDER — BUPIVACAINE-EPINEPHRINE (PF) 0.5% -1:200000 IJ SOLN
INTRAMUSCULAR | Status: DC | PRN
  Administered 2021-07-20: 20 mL via PERINEURAL

## 2021-07-20 MED ORDER — VANCOMYCIN HCL 1000 MG IV SOLR
INTRAVENOUS | Status: DC | PRN
  Administered 2021-07-20: 1000 mg

## 2021-07-20 MED ORDER — SUGAMMADEX SODIUM 200 MG/2ML IV SOLN
INTRAVENOUS | Status: DC | PRN
  Administered 2021-07-20: 200 mg via INTRAVENOUS

## 2021-07-20 MED ORDER — LACTATED RINGERS IV SOLN: INTRAVENOUS | Status: DC

## 2021-07-20 MED ORDER — LACTATED RINGERS IV BOLUS
500.0000 mL | Freq: Once | INTRAVENOUS | Status: AC
  Administered 2021-07-20: 500 mL via INTRAVENOUS

## 2021-07-20 MED ORDER — EPHEDRINE SULFATE-NACL 50-0.9 MG/10ML-% IV SOSY
PREFILLED_SYRINGE | INTRAVENOUS | Status: DC | PRN
  Administered 2021-07-20: 15 mg via INTRAVENOUS

## 2021-07-20 MED ORDER — AMOXICILLIN-POT CLAVULANATE 875-125 MG PO TABS: 1.0000 | ORAL_TABLET | Freq: Two times a day (BID) | ORAL | 0 refills | Status: DC

## 2021-07-20 MED ORDER — 0.9 % SODIUM CHLORIDE (POUR BTL) OPTIME
TOPICAL | Status: DC | PRN
  Administered 2021-07-20: 650 mL

## 2021-07-20 MED ORDER — DEXAMETHASONE SODIUM PHOSPHATE 10 MG/ML IJ SOLN
INTRAMUSCULAR | Status: DC | PRN
  Administered 2021-07-20: 10 mg via INTRAVENOUS

## 2021-07-20 MED ORDER — HYDROMORPHONE HCL 1 MG/ML IJ SOLN: 0.2500 mg | INTRAMUSCULAR | Status: DC | PRN

## 2021-07-20 MED ORDER — FENTANYL CITRATE PF 50 MCG/ML IJ SOSY: 50.0000 ug | PREFILLED_SYRINGE | INTRAMUSCULAR | Status: DC

## 2021-07-20 MED ORDER — MIDAZOLAM HCL 2 MG/2ML IJ SOLN
INTRAMUSCULAR | Status: AC
  Administered 2021-07-20: 2 mg via INTRAVENOUS
  Filled 2021-07-20: qty 2

## 2021-07-20 MED ORDER — VANCOMYCIN HCL 1000 MG IV SOLR
INTRAVENOUS | Status: AC
  Filled 2021-07-20: qty 20

## 2021-07-20 MED ORDER — ONDANSETRON HCL 4 MG/2ML IJ SOLN
INTRAMUSCULAR | Status: AC
  Filled 2021-07-20: qty 2

## 2021-07-20 SURGICAL SUPPLY — 64 items
BAG COUNTER SPONGE SURGICOUNT (BAG) ×2 IMPLANT
BAG ZIPLOCK 12X15 (MISCELLANEOUS) ×2 IMPLANT
BLADE SAW SGTL 83.5X18.5 (BLADE) ×2 IMPLANT
COOLER ICEMAN CLASSIC (MISCELLANEOUS) ×2 IMPLANT
COVER BACK TABLE 60X90IN (DRAPES) ×2 IMPLANT
COVER SURGICAL LIGHT HANDLE (MISCELLANEOUS) ×2 IMPLANT
CUP SUT UNIV REVERS 36 NEUTRAL (Cup) ×2 IMPLANT
DERMABOND ADVANCED (GAUZE/BANDAGES/DRESSINGS) ×1
DERMABOND ADVANCED .7 DNX12 (GAUZE/BANDAGES/DRESSINGS) ×1 IMPLANT
DRAPE INCISE IOBAN 66X45 STRL (DRAPES) IMPLANT
DRAPE ORTHO SPLIT 77X108 STRL (DRAPES) ×4
DRAPE SHEET LG 3/4 BI-LAMINATE (DRAPES) ×2 IMPLANT
DRAPE SURG 17X11 SM STRL (DRAPES) ×2 IMPLANT
DRAPE SURG ORHT 6 SPLT 77X108 (DRAPES) ×2 IMPLANT
DRAPE TOP 10253 STERILE (DRAPES) ×2 IMPLANT
DRAPE U-SHAPE 47X51 STRL (DRAPES) ×2 IMPLANT
DRESSING AQUACEL AG SP 3.5X6 (GAUZE/BANDAGES/DRESSINGS) ×1 IMPLANT
DRSG AQUACEL AG ADV 3.5X10 (GAUZE/BANDAGES/DRESSINGS) IMPLANT
DRSG AQUACEL AG SP 3.5X6 (GAUZE/BANDAGES/DRESSINGS) ×2
DURAPREP 26ML APPLICATOR (WOUND CARE) ×2 IMPLANT
ELECT BLADE TIP CTD 4 INCH (ELECTRODE) ×2 IMPLANT
ELECT REM PT RETURN 15FT ADLT (MISCELLANEOUS) ×2 IMPLANT
FACESHIELD WRAPAROUND (MASK) ×8 IMPLANT
GLENOID UNI REV MOD 24 +2 LAT (Joint) ×2 IMPLANT
GLENOSPHERE 36 +4 LAT/24 (Joint) ×2 IMPLANT
GLOVE SRG 8 PF TXTR STRL LF DI (GLOVE) ×1 IMPLANT
GLOVE SURG ENC MOIS LTX SZ7 (GLOVE) ×2 IMPLANT
GLOVE SURG ENC MOIS LTX SZ7.5 (GLOVE) ×2 IMPLANT
GLOVE SURG UNDER POLY LF SZ7 (GLOVE) ×2 IMPLANT
GLOVE SURG UNDER POLY LF SZ8 (GLOVE) ×2
GOWN STRL REUS W/TWL LRG LVL3 (GOWN DISPOSABLE) ×8 IMPLANT
KIT BASIN OR (CUSTOM PROCEDURE TRAY) ×2 IMPLANT
KIT TURNOVER KIT A (KITS) ×2 IMPLANT
LINER HUMERAL 36 +3MM SM (Shoulder) ×2 IMPLANT
MANIFOLD NEPTUNE II (INSTRUMENTS) ×2 IMPLANT
NEEDLE TAPERED W/ NITINOL LOOP (MISCELLANEOUS) ×2 IMPLANT
NS IRRIG 1000ML POUR BTL (IV SOLUTION) ×2 IMPLANT
PACK SHOULDER (CUSTOM PROCEDURE TRAY) ×2 IMPLANT
PAD ARMBOARD 7.5X6 YLW CONV (MISCELLANEOUS) ×2 IMPLANT
PAD COLD SHLDR WRAP-ON (PAD) ×2 IMPLANT
PIN NITINOL TARGETER 2.8 (PIN) IMPLANT
PIN SET MODULAR GLENOID SYSTEM (PIN) ×2 IMPLANT
RESTRAINT HEAD UNIVERSAL NS (MISCELLANEOUS) ×2 IMPLANT
SCREW CENTRAL MOD 30MM (Screw) ×2 IMPLANT
SCREW PERI LOCK 5.5X16 (Screw) ×2 IMPLANT
SCREW PERIPHERAL 5.5X20 LOCK (Screw) ×4 IMPLANT
SCREW PERIPHERAL 5.5X28 LOCK (Screw) ×2 IMPLANT
SLING ARM FOAM STRAP LRG (SOFTGOODS) IMPLANT
SLING ARM FOAM STRAP MED (SOFTGOODS) ×2 IMPLANT
SPONGE T-LAP 18X18 ~~LOC~~+RFID (SPONGE) IMPLANT
STEM HUMERAL UNI REVERS SZ6 (Stem) ×2 IMPLANT
SUCTION FRAZIER HANDLE 12FR (TUBING) ×2
SUCTION TUBE FRAZIER 12FR DISP (TUBING) ×1 IMPLANT
SUT FIBERWIRE #2 38 T-5 BLUE (SUTURE)
SUT MNCRL AB 3-0 PS2 18 (SUTURE) ×2 IMPLANT
SUT MON AB 2-0 CT1 36 (SUTURE) ×2 IMPLANT
SUT VIC AB 1 CT1 36 (SUTURE) ×2 IMPLANT
SUTURE FIBERWR #2 38 T-5 BLUE (SUTURE) IMPLANT
SUTURE TAPE 1.3 40 TPR END (SUTURE) ×2 IMPLANT
SUTURETAPE 1.3 40 TPR END (SUTURE) ×4
TOWEL OR 17X26 10 PK STRL BLUE (TOWEL DISPOSABLE) ×2 IMPLANT
TOWEL OR NON WOVEN STRL DISP B (DISPOSABLE) ×2 IMPLANT
WATER STERILE IRR 1000ML POUR (IV SOLUTION) ×4 IMPLANT
YANKAUER SUCT BULB TIP 10FT TU (MISCELLANEOUS) ×2 IMPLANT

## 2021-07-20 NOTE — Op Note (Signed)
07/20/2021  2:16 PM  PATIENT:   [name]  70 y.o. female  PRE-OPERATIVE DIAGNOSIS:  Right shoulder rotator cuff tear arthropathy  POST-OPERATIVE DIAGNOSIS: Same  PROCEDURE: Right reverse shoulder arthroplasty utilizing a press-fit size 6 Arthrex stem with a neutral metaphysis, +3 polyethylene insert, 36/+4 glenosphere on a small/+2 baseplate  SURGEON:  Zameria Vogl, Metta Clines M.D.  ASSISTANTS: Jenetta Loges, PA-C  ANESTHESIA:   General endotracheal and interscalene block with Exparel  EBL: 100 cc  SPECIMEN: None  Drains: None   PATIENT DISPOSITION:  PACU - hemodynamically stable.    PLAN OF CARE: Discharge to home after PACU  Brief history:  Patient is a 70 year old female well-known to our practice after a previous left shoulder reverse arthroplasty performed a number of years ago, presents now with chronic and progressively increasing right shoulder pain related to severe rotator cuff tear arthropathy.  Due to her increasing functional imitations and failure to respond to prolonged attempts at conservative management, she is brought to the operating this time for planned right shoulder reverse arthroplasty.  Preoperatively, I counseled the patient regarding treatment options and risks versus benefits thereof.  Possible surgical complications were all reviewed including potential for bleeding, infection, neurovascular injury, persistent pain, loss of motion, anesthetic complication, failure of the implant, and possible need for additional surgery. They understand and accept and agrees with our planned procedure.   Procedure in detail:  After undergoing routine preop evaluation the patient received prophylactic antibiotics and interscalene block with Exparel was established in the holding area by the anesthesia department.  Patient was subsequently placed supine on the operating table and underwent the smooth induction of a general endotracheal anesthesia.  Placed into the  beachchair position and appropriately padded and protected.  The right shoulder girdle region was sterilely prepped and draped in standard fashion.  Timeout was called.  A deltopectoral approach was made to the right shoulder through an approximately 7 cm incision.  Skin flaps were elevated dissection carried deeply and the deltopectoral interval was developed from proximal to distal with the vein taken laterally.  Adhesions were divided beneath the deltoid and the conjoined tendon was mobilized and retracted medially and the upper centimeter the pectoralis major was tenotomized for exposure.  There had been a previous rupture of the long head biceps tendon.  The subscapularis was divided from the lesser tuberosity using a peel technique and then was tagged with a pair of suture tape sutures.  Capsular attachments were then divided from the anterior and infra margins of the humeral neck allowing delivery of the humeral head through the wound.  An extra medullary guide was then used to perform our humeral head resection which we performed at approximately 20 degrees retroversion.  A metal cap was then placed over the cut proximal humeral surface we then exposed the glenoid with the appropriate retractors.  A circumferential labral resection was completed and once visualization of the entire glenoid was achieved a guidepin was then directed into the center of the glenoid with an approximately 5 degree inferior tilt and the glenoid was then reamed with the central and peripheral reamers to a stable subchondral bony bed and all debris was carefully removed.  Preparation completed with the central drill and tap and a 30 mm lag screw was selected.  Our baseplate was then assembled with vancomycin powder applied to the threads of the lag screw the baseplate was inserted with excellent purchase and fixation.  The peripheral locking screws were all then placed  using standard technique.  A 36/+4 glenosphere was then impacted  onto the baseplate and the central locking screw was placed.  We returned our attention to the proximal humerus where the canal was opened hand reaming to size 6 and ultimately broaching to a size 6 at approximately 20 degrees retroversion.  A neutral reaming guide was then used to prepare the metaphysis.  A trial implant was placed showing excellent motion stability and soft tissue balance.  The trial implant was removed.  The final implant was assembled.  Vancomycin powder placed into the humeral canal and our final implant was then impacted into position with excellent fit and fixation.  A trial reduction at this point showed again good motion stability and soft tissue balance with a +3 poly-.  Our trial was removed the final polyp was then impacted onto the implant after had been cleaned and dried.  A final reduction then again showed excellent motion stability and soft tissue balance.  This point we then confirmed proper mobilization of the subscapularis and was then repaired back to the eyelets on the collar the implant using the previously placed suture tape sutures.  The arm easily achieved 45 degrees of external rotation without excessive tension on the subscap repair.  The wound was again copiously irrigated.  Hemostasis was obtained.  The balance of the vancomycin powder was then spread liberally throughout the deep soft tissue planes.  The deltopectoral interval was reapproximated with a series of figure-of-eight number Vicryl sutures.  2-0 Vicryl used for the subcu layer and intracuticular 3-0 Monocryl for the skin followed by Dermabond and Aquacel dressing.  Right arm was then placed in a sling the patient was awakened, extubated, and taken to the recovery room in stable condition.  Jenetta Loges, PA-C was utilized as an Environmental consultant throughout this case, essential for help with positioning the patient, positioning extremity, tissue manipulation, implantation of the prosthesis, suture management, wound  closure, and intraoperative decision-making.  Marin Shutter MD   Contact # [phone]

## 2021-07-20 NOTE — Anesthesia Procedure Notes (Signed)
Procedure Name: Intubation Date/Time: 07/20/2021 1:06 PM Performed by: Gerald Leitz, CRNA Pre-anesthesia Checklist: Patient identified, Patient being monitored, Timeout performed, Emergency Drugs available and Suction available Patient Re-evaluated:Patient Re-evaluated prior to induction Oxygen Delivery Method: Circle system utilized Preoxygenation: Pre-oxygenation with 100% oxygen Induction Type: IV induction Ventilation: Mask ventilation without difficulty Laryngoscope Size: Mac and 3 Grade View: Grade I Tube type: Oral Tube size: 7.0 mm Number of attempts: 1 Placement Confirmation: ETT inserted through vocal cords under direct vision, positive ETCO2 and breath sounds checked- equal and bilateral Secured at: 21 cm Tube secured with: Tape Dental Injury: Teeth and Oropharynx as per pre-operative assessment

## 2021-07-20 NOTE — H&P (Signed)
Cassidy Barber    Chief Complaint: Right shoulder rotator cuff tear arthropathy HPI: The patient is a 70 y.o. female with chronic and progressively increasing right shoulder pain related to severe rotator cuff tear arthropathy.  Due to her increasing functional imitations and failure to respond to prolonged attempts at conservative management, she is brought to the operating this time for planned right shoulder reverse arthroplasty  Past Medical History:  Diagnosis Date   Abdominal wall hernia    Incarcerated status post surgical repair 2019 - [facility]   Anemia of chronic disease    Atypical nevus 01/21/2018   atypical neoplasm- Left scalp-ant (txpbx + MOHS), atypical neoplasm- Left scalp post- (txpbx + MOHS)   Basal cell carcinoma    Colon cancer ([place])    Colon surgery 2005 and 2012   History of pulmonary hypertension    Pre liver transplant   Hypertension    Hypothyroidism    Lynch syndrome    Osteopenia    Primary biliary cirrhosis ([place])    Status post liver transplantation - follows at [place] (squamous cell carcinoma) of skin 02/23/2020   Right Upper Chest(moderate) (MOH's)   SCCA (squamous cell carcinoma) of skin 04/07/2020   Left Top Leg (Keratoacanthoma) treatment after biopsy   SCCA (squamous cell carcinoma) of skin 04/07/2020   Left Foot Dorsal (in situ) treatment after biopsy   SCCA (squamous cell carcinoma) of skin 03/27/2021   Right Upper Back (Keratoacanthoma) (excision) (clear)   SCCA (squamous cell carcinoma) of skin 06/13/2021   Right Shoulder - anterior (moderately differentiated) (tx p bx)   SCCA (squamous cell carcinoma) of skin 06/13/2021   Right Thigh - anterior (well differentiated) (tx p bx)   SCCA (squamous cell carcinoma) of skin 06/13/2021   Right Lower Leg - anterior (well differentiated) (tx p bx)   Squamous cell carcinoma of skin 04/22/2018   KA-Right mid chest (txpbx), KA-left elbow crease (txpbx), insitu-Right mid chest inf. (exc)   Squamous cell  carcinoma of skin 05/20/2018   well diff-Left upper shin (txpbx), well diff-Right lower forearm (txpbx), well diff-Right upper shin (txpbx)   Squamous cell carcinoma of skin 06/11/2018   Scc + margin-Right mid chest inferior    Squamous cell carcinoma of skin 06/27/2018   well diff-Left mid thigh(txpbx), well diff-Left inner thigh (txpbx),insitu-Right cheek (txpbx),well diff-right inner heel (txpbx)   Squamous cell carcinoma of skin 09/16/2018   well diff-left shoulder (txpbx), well diff-Right chin (txpbx), well diff-right chest lateral (CX35FU)   Squamous cell carcinoma of skin 10/01/2018   Right outer lower shin (Txpbx)   Squamous cell carcinoma of skin 04/03/2019   well diff-Right center chest (MOHS), in situ-Right ear   Squamous cell carcinoma of skin 08/05/2019   in situ-Left calf (txpbx), in situ-left bicep (txpbx), well diff-Left chest,inf(txpbx), in situ-Right chest inf-(txpbx)   Squamous cell carcinoma of skin 11/19/2019   KA-left top leg (txpbx), modify-Riight forehead-(mohs), in situ-right hand (txpbx), in situ-Right forearm (txpbx), well diff-Right chest (txpbx), well diff-chin (txpbx)   Squamous cell carcinoma of skin 01/06/2020   KA- Left top leg   Squamous cell carcinoma of skin 05/12/2013   bowens-middle of chest (CX35FU)   Squamous cell carcinoma of skin 05/18/2015   well diff-Left upper arm (CX35FU + Exc),KA-right chest(txpbx), in situ-Left shin (txpbx), well diff-Right cheek (CX35FU), KA-Left post scalp (CX35FU)   Squamous cell carcinoma of skin 08/09/2015   KA-Left post scalp ((MOHS), in situ- mid chest (Txpbx +exc), in situ-Left upper arm  inferior (txpbx)   Squamous cell carcinoma of skin 10/13/2015   Left upper arm-clear   Squamous cell carcinoma of skin 03/09/2016   mod diff-mid chest (txpbx+ exc), mod diff-Right chest (txpbx+exc), well diff-right cheek-(txpbx),well diff-Left hand-(txpbx), in situ-Left upper arm (txpbx), well diff-Right cheek -(txpbx), well  diff-Right crease arm (txpbx)    Squamous cell carcinoma of skin 05/24/2016   well diff-Right nasal crease-(MOHS)   Squamous cell carcinoma of skin 08/02/2016   KA-Left chest med (txpbx)   Squamous cell carcinoma of skin 08/30/2016   in situ-Left outer zygoma (txpbx)   Squamous cell carcinoma of skin 12/06/2016   well diff-Left chest sup, Left shoudler, insitu- right post scalp   Squamous cell carcinoma of skin 02/14/2017   well diff-Left forearm (EXC),in situ-RIght ant neck   Squamous cell carcinoma of skin 06/14/2017   in situ-Right forearm (txpbx), in situ-Right chest (txpbx), well diff-left chest (txpbx), well diff-anterior neck- (txpbx)   Squamous cell carcinoma of skin 08/07/2017   well diff-Left upper shoulder (txpbx), sup and invasive-Left temple (txpbx), well diff-Right upper shin (txpbx), in situ-Right clavicle (txpbx)   Squamous cell carcinoma of skin 10/17/2017   well diff-ant. neck (MOHS), in situ-Right chest, inf (txpbx)   Squamous cell carcinoma of skin 01/21/2018   well diff- Right chest,ulnar (txpbx), well diff- right upper chest (txpbx), in situ-Right ant. crown (txpbx)   Squamous cell carcinoma of skin 08/02/2020   well diff-left lower leg-inferior (Txpbx)   Squamous cell carcinoma of skin 08/02/2020   well diff-right lower leg-mid (txpbx)   Squamous cell carcinoma of skin 08/02/2020   well diff-left chest upper   Squamous cell carcinoma of skin 08/02/2020   well diff-mid parietal scalp (MOHS)   Squamous cell carcinoma of skin 08/02/2020   well diff-right foot inner(txpbx)   Squamous cell carcinoma of skin 08/02/2020   well diff- left lower leg medial (txpbx)   Squamous cell carcinoma of skin 08/02/2020   well diff-left lower leg anterior (txpbx)   Squamous cell carcinoma of skin 08/02/2020   well diff-left lower leg medial (txpbx)   Squamous cell carcinoma of skin 08/02/2020   well diff-right forearm-posterior (txpbx)    Past Surgical History:  Procedure  Laterality Date   ABDOMINAL HERNIA REPAIR     Patient's states that she has had 8- 9 hernia surgeries   ABDOMINAL HYSTERECTOMY     BIOPSY  02/15/2021   Procedure: BIOPSY;  Surgeon: Rogene Houston, MD;  [facility];  Service: Endoscopy;;   CATARACT EXTRACTION W/PHACO Right 01/15/2020   Procedure: CATARACT EXTRACTION PHACO AND INTRAOCULAR LENS PLACEMENT ([place]);  Surgeon: Baruch Goldmann, MD;  Location: [facility];  Service: Ophthalmology;  Laterality: Right;  CDE: 7.89   CATARACT EXTRACTION W/PHACO Left 01/29/2020   Procedure: CATARACT EXTRACTION PHACO AND INTRAOCULAR LENS PLACEMENT ([facility]) (CDE: 6.33);  Surgeon: Baruch Goldmann, MD;  Location: [facility];  Service: Ophthalmology;  Laterality: Left;   CHOLECYSTECTOMY  2007   COLON SURGERY  2008   Done at [facility]   COLONOSCOPY     Done at [facility]   ESOPHAGOGASTRODUODENOSCOPY N/A 08/21/2018   Procedure: ESOPHAGOGASTRODUODENOSCOPY (EGD);  Surgeon: Rogene Houston, MD;  [facility];  Service: Endoscopy;  Laterality: N/A;   ESOPHAGOGASTRODUODENOSCOPY (EGD) WITH PROPOFOL N/A 12/16/2019   Procedure: ESOPHAGOGASTRODUODENOSCOPY (EGD) WITH PROPOFOL;  Surgeon: Rogene Houston, MD;  [facility];  Service: Endoscopy;  Laterality: N/A;   EYE SURGERY     lasix   FLEXIBLE SIGMOIDOSCOPY N/A  10/20/2015   Procedure: FLEXIBLE SIGMOIDOSCOPY;  Surgeon: Rogene Houston, MD;  [facility];  Service: Endoscopy;  Laterality: N/A;  84 - Dr Laural Golden has meeting until 1:00   FLEXIBLE SIGMOIDOSCOPY N/A 07/11/2016   Procedure: FLEXIBLE SIGMOIDOSCOPY;  Surgeon: Rogene Houston, MD;  [facility];  Service: Endoscopy;  Laterality: N/A;  [place] N/A 08/09/2017   Procedure: FLEXIBLE SIGMOIDOSCOPY;  Surgeon: Rogene Houston, MD;  [facility];  Service: Endoscopy;  Laterality: N/A;  1:00   FLEXIBLE SIGMOIDOSCOPY N/A 08/21/2018   Procedure: FLEXIBLE SIGMOIDOSCOPY;  Surgeon: Rogene Houston, MD;   [facility];  Service: Endoscopy;  Laterality: N/A;   FLEXIBLE SIGMOIDOSCOPY N/A 12/16/2019   Procedure: FLEXIBLE SIGMOIDOSCOPY wirh Propofol;  Surgeon: Rogene Houston, MD;  [facility];  Service: Endoscopy;  Laterality: N/A;  7:30   FLEXIBLE SIGMOIDOSCOPY N/A 02/15/2021   Procedure: FLEXIBLE SIGMOIDOSCOPY WITH PROPOFOL;  Surgeon: Rogene Houston, MD;  [facility];  Service: Endoscopy;  Laterality: N/A;  am   FRACTURE SURGERY     right wrist metal plate   LIVER TRANSPLANT  11/19/2013   POLYPECTOMY  08/09/2017   Procedure: POLYPECTOMY;  Surgeon: Rogene Houston, MD;  [facility];  Service: Endoscopy;;  colon small bowel   REVERSE SHOULDER ARTHROPLASTY Left 07/17/2018   Procedure: LEFT REVERSE SHOULDER ARTHROPLASTY;  Surgeon: Justice Britain, MD;  Location: [facility];  Service: Orthopedics;  Laterality: Left;  114mn   SHOULDER CLOSED REDUCTION Left 09/27/2019   Procedure: CLOSED REDUCTION SHOULDER;  Surgeon: OParalee Cancel MD;  [facility];  Service: Orthopedics;  Laterality: Left;   SPLENECTOMY  2006   TOTAL SHOULDER REVISION Left 11/12/2019   Procedure: Revision Left Reverse Shoulder Arthroplasty with poly exchange SDD;  Surgeon: SJustice Britain MD;  Location[facility];  Service: Orthopedics;  Laterality: Left;  1253m -SDDC   TYMPANOSTOMY TUBE PLACEMENT     UPPER GASTROINTESTINAL ENDOSCOPY     [facility]  Family History  Problem Relation Age of Onset   Prostate cancer Father    Colon cancer Father    Colon cancer Sister    Lung cancer Sister    Healthy Son    Alcohol abuse Brother    Allergic rhinitis Neg Hx    Asthma Neg Hx    Eczema Neg Hx    Urticaria Neg Hx     Social History:  reports that she has never smoked. She has never used smokeless tobacco. She reports that she does not drink alcohol and does not use drugs.   Medications Prior to Admission  Medication Sig Dispense Refill   acetaminophen (TYLENOL) 500 MG tablet  Take 1,000 mg by mouth every 6 (six) hours as needed (for pain.).      alendronate (FOSAMAX) 70 MG tablet TAKE 1 TABLET WEEKLY (TAKE WITH 8OZ OF WATER 30 MINUTES BEFORE BREAKFAST) (Patient taking differently: Take 70 mg by mouth every Thursday. (TAKE WITH 8OZ OF WATER 30 MINUTES BEFORE BREAKFAST)) 12 tablet 2   aspirin EC 81 MG tablet Take 81 mg by mouth daily.     Biotin 1000 MCG tablet Take 1,000 mcg by mouth 3 (three) times daily.     Calcium Citrate-Vitamin D (CALCIUM CITRATE + D3 PO) Take 600 mg by mouth 2 (two) times daily.     cephALEXin (KEFLEX) 500 MG capsule Take 1 capsule (500 mg total) by mouth 2 (two) times  daily. 14 capsule 0   cetirizine (ZYRTEC) 10 MG tablet TAKE 1 TO 2 TABLETS TWICE DAILY (Patient taking differently: Take 10 mg by mouth daily as needed for allergies.) 120 tablet 5   clotrimazole-betamethasone (LOTRISONE) cream Apply 1 application topically 2 (two) times daily. (Patient taking differently: Apply 1 application topically daily as needed (Rash).) 30 g 0   Cyanocobalamin (VITAMIN B-12) 5000 MCG TBDP Take 5,000 mcg by mouth 2 (two) times a week. Every Monday and Wednesday.     famotidine (PEPCID) 20 MG tablet Take 20 mg by mouth daily. 90 tablet 3   fluorouracil (EFUDEX) 5 % cream Apply 1 application topically daily as needed (cancer spots).   0   gabapentin (NEURONTIN) 100 MG capsule Take 1 capsule (100 mg total) by mouth daily. 90 capsule 2   levothyroxine (SYNTHROID) 112 MCG tablet Take 1 tablet (112 mcg total) by mouth daily. 90 tablet 4   loperamide (IMODIUM) 2 MG capsule Take 2 capsules (4 mg total) by mouth 3 (three) times daily as needed for diarrhea or loose stools. 200 capsule 0   losartan (COZAAR) 25 MG tablet Take 1 tablet (25 mg total) by mouth daily. 90 tablet 4   metoprolol succinate (TOPROL XL) 25 MG 24 hr tablet Take 1 tablet (25 mg total) by mouth daily. 90 tablet 4   NIACINAMIDE-ZINC-FOLIC ACID PO Take 2 tablets by mouth 2 (two) times daily.      pantoprazole (PROTONIX) 40 MG tablet Take 1 tablet (40 mg total) by mouth 2 (two) times daily. 180 tablet 2   SSD 1 % cream Apply 1 application topically daily as needed (Radiation).     ursodiol (ACTIGALL) 250 MG tablet Take 250 mg by mouth 2 (two) times daily.      Vitamin D, Ergocalciferol, (DRISDOL) 1.25 MG (50000 UNIT) CAPS capsule Take 1 capsule (50,000 Units total) by mouth once a week. 12 capsule 3   vitamin E 180 MG (400 UNITS) capsule Take 400 Units by mouth daily.     ZORTRESS 0.5 MG TABS Take 4 tablets (2 mg total) by mouth 2 (two) times daily. 180 tablet 2   ALPRAZolam (XANAX) 0.5 MG tablet Take 1 tablet (0.5 mg total) by mouth at bedtime. 90 tablet 1   mupirocin ointment (BACTROBAN) 2 % Apply 1 application topically daily as needed (Cancer spots).       Physical Exam: Right shoulder demonstrates painful and guarded motion as noted at her recent office visits.  She is otherwise neurovascular intact.  Plain radiographs as well as MRI scan confirms changes consistent with chronic rotator cuff tear arthropathy.  Vitals  Temp:  [97.5 F (36.4 C)] 97.5 F (36.4 C) (09/08 1025) Pulse Rate:  [61] 61 (09/08 1025) Resp:  [16] 16 (09/08 1025) BP: (151)/(82) 151/82 (09/08 1025) SpO2:  [99 %] 99 % (09/08 1025) Weight:  [63.8 kg] 63.8 kg (09/08 1032)  Assessment/Plan  Impression: Right shoulder rotator cuff tear arthropathy  Plan of Action: Procedure(s): REVERSE SHOULDER ARTHROPLASTY  Edit Ricciardelli M Aarya Quebedeaux 07/20/2021, 11:59 AM Contact # [phone]

## 2021-07-20 NOTE — Progress Notes (Signed)
AssistedDr. Royce Macadamia with right, ultrasound guided, interscalene  block. Side rails up, monitors on throughout procedure. See vital signs in flow sheet. Tolerated Procedure well.

## 2021-07-20 NOTE — Anesthesia Procedure Notes (Signed)
Anesthesia Regional Block: Interscalene brachial plexus block   Pre-Anesthetic Checklist: , timeout performed,  Correct Patient, Correct Site, Correct Laterality,  Correct Procedure, Correct Position, site marked,  Risks and benefits discussed,  Surgical consent,  Pre-op evaluation,  At surgeon's request and post-op pain management  Laterality: Right  Prep: chloraprep       Needles:  Injection technique: Single-shot  Needle Type: Echogenic Stimulator Needle     Needle Length: 10cm  Needle Gauge: 21   Needle insertion depth: 6 cm   Additional Needles:   Procedures:,,,, ultrasound used (permanent image in chart),,   Motor weakness within 5 minutes.  Narrative:  Start time: 07/20/2021 11:56 AM End time: 07/20/2021 12:01 PM Injection made incrementally with aspirations every 5 mL.  Performed by: Personally  Anesthesiologist: Josephine Igo, MD  Additional Notes: Timeout performed. Patient sedated. Relevant anatomy ID'd using [place]. Incremental 2-52m injection of LA with frequent aspiration. Patient tolerated procedure well.    Right Interscalene Block

## 2021-07-20 NOTE — Transfer of Care (Signed)
Immediate Anesthesia Transfer of Care Note  Patient: Cassidy Barber  Procedure(s) Performed: Procedure(s): REVERSE SHOULDER ARTHROPLASTY (Right)  Patient Location: PACU  Anesthesia Type:General  Level of Consciousness: Alert, Awake, Oriented  Airway & Oxygen Therapy: Patient Spontanous Breathing  Post-op Assessment: Report given to RN  Post vital signs: Reviewed and stable  Last Vitals:  Vitals:   07/20/21 1200 07/20/21 1205  BP: (!) 143/75 128/69  Pulse: 72 (!) 31  Resp: 11 15  Temp:    SpO2: 123XX123 (!) 123456    Complications: No apparent anesthesia complications

## 2021-07-20 NOTE — Discharge Instructions (Signed)
 Kevin M. Supple, M.D., F.A.A.O.S. Orthopaedic Surgery Specializing in Arthroscopic and Reconstructive Surgery of the Shoulder [phone] [address] - [place], [place] [address] - Fax [phone]   POST-OP TOTAL SHOULDER REPLACEMENT INSTRUCTIONS  1. Follow up in the office for your first post-op appointment 10-14 days from the date of your surgery. If you do not already have a scheduled appointment, our office will contact you to schedule.  2. The bandage over your incision is waterproof. You may begin showering with this dressing on. You may leave this dressing on until first follow up appointment within 2 weeks. We prefer you leave this dressing in place until follow up however after 5-7 days if you are having itching or skin irritation and would like to remove it you may do so. Go slow and tug at the borders gently to break the bond the dressing has with the skin. At this point if there is no drainage it is okay to go without a bandage or you may cover it with a light guaze and tape. You can also expect significant bruising around your shoulder that will drift down your arm and into your chest wall. This is very normal and should resolve over several days.   3. Wear your sling/immobilizer at all times except to perform the exercises below or to occasionally let your arm dangle by your side to stretch your elbow. You also need to sleep in your sling immobilizer until instructed otherwise. It is ok to remove your sling if you are sitting in a controlled environment and allow your arm to rest in a position of comfort by your side or on your lap with pillows to give your neck and skin a break from the sling. You may remove it to allow arm to dangle by side to shower. If you are up walking around and when you go to sleep at night you need to wear it.  4. Range of motion to your elbow, wrist, and hand are encouraged 3-5 times daily. Exercise to your hand and fingers helps to reduce  swelling you may experience.   5. Prescriptions for a pain medication and a muscle relaxant are provided for you. It is recommended that if you are experiencing pain that you pain medication alone is not controlling, add the muscle relaxant along with the pain medication which can give additional pain relief. The first 1-2 days is generally the most severe of your pain and then should gradually decrease. As your pain lessens it is recommended that you decrease your use of the pain medications to an "as needed basis'" only and to always comply with the recommended dosages of the pain medications.  6. Pain medications can produce constipation along with their use. If you experience this, the use of an over the counter stool softener or laxative daily is recommended.   7. For additional questions or concerns, please do not hesitate to call the office. If after hours there is an answering service to forward your concerns to the physician on call.  8.Pain control following an exparel block  To help control your post-operative pain you received a nerve block  performed with Exparel which is a long acting anesthetic (numbing agent) which can provide pain relief and sensations of numbness (and relief of pain) in the operative shoulder and arm for up to 3 days. Sometimes it provides mixed relief, meaning you may still have numbness in certain areas of the arm but can still be able to   move  parts of that arm, hand, and fingers. We recommend that your prescribed pain medications  be used as needed. We do not feel it is necessary to "pre medicate" and "stay ahead" of pain.  Taking narcotic pain medications when you are not having any pain can lead to unnecessary and potentially dangerous side effects.    9. Use the ice machine as much as possible in the first 5-7 days from surgery, then you can wean its use to as needed. The ice typically needs to be replaced every 6 hours, instead of ice you can actually freeze  water bottles to put in the cooler and then fill water around them to avoid having to purchase ice. You can have spare water bottles freezing to allow you to rotate them once they have melted. Try to have a thin shirt or light cloth or towel under the ice wrap to protect your skin.   FOR ADDITIONAL INFO ON ICE MACHINE AND INSTRUCTIONS GO TO THE WEBSITE AT  https://[email or web address].com/products/donjoy/[email or web address]-iceman-[email or web address]  10.  We recommend that you avoid any dental work or cleaning in the first 3 months following your joint replacement. This is to help minimize the possibility of infection from the bacteria in your mouth that enters your bloodstream during dental work. We also recommend that you take an antibiotic prior to your dental work for the first year after your shoulder replacement to further help reduce that risk. Please simply contact our office for antibiotics to be sent to your pharmacy prior to dental work.  11. Dental Antibiotics:  In most cases prophylactic antibiotics for Dental procdeures after total joint surgery are not necessary.  Exceptions are as follows:  1. History of prior total joint infection  2. Severely immunocompromised (Organ Transplant, cancer chemotherapy, Rheumatoid biologic meds such as Humera)  3. Poorly controlled diabetes (A1C &gt; 8.0, blood glucose over 200)  If you have one of these conditions, contact your surgeon for an antibiotic prescription, prior to your dental procedure.   POST-OP EXERCISES  Pendulum Exercises  Perform pendulum exercises while standing and bending at the waist. Support your uninvolved arm on a table or chair and allow your operated arm to hang freely. Make sure to do these exercises passively - not using you shoulder muscles. These exercises can be performed once your nerve block effects have worn off.  Repeat 20 times. Do 3 sessions per day.     

## 2021-07-20 NOTE — Evaluation (Signed)
Occupational Therapy Evaluation Patient Details Name: Cassidy Barber MRN: [id number] DOB: 03/18/1951 Today's Date: 07/20/2021    History of Present Illness s/p rTSA   Clinical Impression   Cassidy Barber is a 70 year old woman s/p shoulder replacement without functional use of right dominant upper extremity secondary to effects of surgery and interscalene block and shoulder precautions. Therapist provided education and instruction to patient and spouse in regards to exercises, precautions, positioning, donning upper extremity clothing and bathing while maintaining shoulder precautions. They are familiar with precautions from prior surgeries and how to use ice cuff and cooler. Patient needed assistance to donn shirt, underwear, and pants secondary to numb and lack of AROM from block. Patient has assistance from husband at home. Patient to follow up with MD for further therapy needs.      Follow Up Recommendations  Follow surgeon's recommendation for DC plan and follow-up therapies    Equipment Recommendations  None recommended by OT    Recommendations for Other Services       Precautions / Restrictions Precautions Precautions: Shoulder Type of Shoulder Precautions: If sitting in controlled environment, ok to come out of sling to give neck a break. Please sleep in it to protect until follow up in office.     OK to use operative arm for feeding, hygiene and ADLs.   Ok to instruct Pendulums and lap slides as exercises. Ok to use operative arm within the following parameters for ADL purposes     New ROM (8/18)   Ok for PROM, AAROM, AROM within pain tolerance and within the following ROM   ER 20   ABD 45   FE 60 Shoulder Interventions: Shoulder sling/immobilizer;At all times;Off for dressing/bathing/exercises Precaution Booklet Issued:  (handouts\) Required Braces or Orthoses: Sling Restrictions Weight Bearing Restrictions: Yes RUE Weight Bearing: Non weight bearing      Mobility Bed  Mobility                    Transfers                      Balance                                           ADL either performed or assessed with clinical judgement   ADL                                               Vision         Perception     Praxis      Pertinent Vitals/Pain Pain Assessment: No/denies pain     Hand Dominance     Extremity/Trunk Assessment Upper Extremity Assessment Upper Extremity Assessment: RUE deficits/detail RUE Deficits / Details: impaired secondary to effects of interscalene block RUE Sensation: WNL RUE Coordination: WNL   Lower Extremity Assessment Lower Extremity Assessment: Overall WFL for tasks assessed   Cervical / Trunk Assessment Cervical / Trunk Assessment: Normal   Communication     Cognition Arousal/Alertness: Awake/alert Behavior During Therapy: WFL for tasks assessed/performed Overall Cognitive Status: Within Functional Limits for tasks assessed  General Comments       Exercises     Shoulder Instructions Shoulder Instructions Donning/doffing shirt without moving shoulder: Patient able to independently direct caregiver Method for sponge bathing under operated UE: Independent Donning/doffing sling/immobilizer: Patient able to independently direct caregiver Correct positioning of sling/immobilizer: Patient able to independently direct caregiver Pendulum exercises (written home exercise program): Independent ROM for elbow, wrist and digits of operated UE: Independent Sling wearing schedule (on at all times/off for ADL's): Independent Proper positioning of operated UE when showering: Independent Dressing change: Independent Positioning of UE while sleeping: [place]                                          Prior Functioning/Environment                   OT Problem  List: Decreased strength;Decreased range of motion;Impaired UE functional use      OT Treatment/Interventions:      OT Goals(Current goals can be found in the care plan section) Acute Rehab OT Goals OT Goal Formulation: All assessment and education complete, DC therapy  OT Frequency:     Barriers to D/C:            Co-evaluation              AM-PAC OT "6 Clicks" Daily Activity     Outcome Measure Help from another person eating meals?: None Help from another person taking care of personal grooming?: None Help from another person toileting, which includes using toliet, bedpan, or urinal?: A Little Help from another person bathing (including washing, rinsing, drying)?: None Help from another person to put on and taking off regular upper body clothing?: A Little Help from another person to put on and taking off regular lower body clothing?: A Little 6 Click Score: 21   End of Session Nurse Communication:  (Ot education cmoplete)  Activity Tolerance: Patient tolerated treatment well Patient left: in chair;with call bell/phone within reach  OT Visit Diagnosis: Muscle weakness (generalized) (M62.81)                Time: [id number] OT Time Calculation (min): 15 min Charges:  OT General Charges $OT Visit: 1 Visit OT Evaluation $OT Eval Low Complexity: 1 Low  Tristram Milian, OTR/L [place]  Office [phone] Pager: [phone]   Lenward Chancellor 07/20/2021, 3:55 PM

## 2021-07-20 NOTE — Anesthesia Preprocedure Evaluation (Addendum)
Anesthesia Evaluation  Patient identified by MRN, date of birth, ID band Patient awake    Reviewed: Allergy & Precautions, NPO status , Patient's Chart, lab work & pertinent test results  Airway Mallampati: II  TM Distance: >3 FB Neck ROM: Full    Dental  (+) Missing, Partial Upper, Dental Advisory Given,    Pulmonary neg pulmonary ROS,    Pulmonary exam normal breath sounds clear to auscultation       Cardiovascular hypertension, Normal cardiovascular exam Rhythm:Regular Rate:Normal  Hx/o Pulmonary HTN   Neuro/Psych Seizures -, Well Controlled,  PSYCHIATRIC DISORDERS Anxiety    GI/Hepatic GERD  Medicated and Controlled,(+) Cirrhosis     substance abuse  , Hepatitis -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 primary biliary cirrhosis S/P liver transplant On immunosuppression agents Hx/o benzodiazepine dependence   Endo/Other  Hypothyroidism   Renal/GU Renal disease  negative genitourinary   Musculoskeletal  (+) Arthritis , Right shoulder arthropathy   Abdominal   Peds  Hematology  (+) anemia ,   Anesthesia Other Findings   Reproductive/Obstetrics                            Anesthesia Physical Anesthesia Plan  ASA: 3  Anesthesia Plan: General   Post-op Pain Management:  Regional for Post-op pain   Induction: Intravenous  PONV Risk Score and Plan: 4 or greater and Treatment may vary due to age or medical condition and Ondansetron  Airway Management Planned: Oral ETT  Additional Equipment:   Intra-op Plan:   Post-operative Plan: Extubation in OR  Informed Consent: I have reviewed the patients History and Physical, chart, labs and discussed the procedure including the risks, benefits and alternatives for the proposed anesthesia with the patient or authorized representative who has indicated his/her understanding and acceptance.     Dental advisory given  Plan Discussed with: CRNA and  Anesthesiologist  Anesthesia Plan Comments:         Anesthesia Quick Evaluation

## 2021-07-20 NOTE — Telephone Encounter (Signed)
Augmentin Prescription sent to pharmacy

## 2021-07-22 NOTE — Anesthesia Postprocedure Evaluation (Signed)
Anesthesia Post Note  Patient: Cassidy Barber  Procedure(s) Performed: REVERSE SHOULDER ARTHROPLASTY (Right: Shoulder)     Patient location during evaluation: PACU Anesthesia Type: General Level of consciousness: awake Pain management: pain level controlled Vital Signs Assessment: post-procedure vital signs reviewed and stable Respiratory status: spontaneous breathing Cardiovascular status: stable Postop Assessment: no apparent nausea or vomiting Anesthetic complications: no   No notable events documented.  Last Vitals:  Vitals:   07/20/21 1515 07/20/21 1526  BP: (!) 158/81 (!) 153/91  Pulse: 72 73  Resp: 17 14  Temp: (!) 36.3 C   SpO2: 95% 94%    Last Pain:  Vitals:   07/20/21 1526  TempSrc:   PainSc: 0-No pain                 Huston Foley

## 2021-07-26 ENCOUNTER — Encounter (HOSPITAL_COMMUNITY): Payer: Self-pay | Admitting: Orthopedic Surgery

## 2021-07-29 DIAGNOSIS — Z944 Liver transplant status: Secondary | ICD-10-CM | POA: Diagnosis not present

## 2021-07-31 DIAGNOSIS — Z4789 Encounter for other orthopedic aftercare: Secondary | ICD-10-CM | POA: Diagnosis not present

## 2021-08-01 ENCOUNTER — Other Ambulatory Visit: Payer: Self-pay | Admitting: Family

## 2021-08-01 DIAGNOSIS — L509 Urticaria, unspecified: Secondary | ICD-10-CM

## 2021-08-10 DIAGNOSIS — Z944 Liver transplant status: Secondary | ICD-10-CM | POA: Diagnosis not present

## 2021-08-10 DIAGNOSIS — D849 Immunodeficiency, unspecified: Secondary | ICD-10-CM | POA: Diagnosis not present

## 2021-08-14 ENCOUNTER — Ambulatory Visit: Payer: Medicare Other

## 2021-08-14 DIAGNOSIS — C44722 Squamous cell carcinoma of skin of right lower limb, including hip: Secondary | ICD-10-CM | POA: Diagnosis not present

## 2021-08-14 DIAGNOSIS — L989 Disorder of the skin and subcutaneous tissue, unspecified: Secondary | ICD-10-CM | POA: Diagnosis not present

## 2021-08-15 ENCOUNTER — Ambulatory Visit: Payer: Medicare Other

## 2021-08-17 DIAGNOSIS — C44722 Squamous cell carcinoma of skin of right lower limb, including hip: Secondary | ICD-10-CM | POA: Diagnosis not present

## 2021-08-18 DIAGNOSIS — Z944 Liver transplant status: Secondary | ICD-10-CM | POA: Diagnosis not present

## 2021-08-23 ENCOUNTER — Other Ambulatory Visit: Payer: Self-pay

## 2021-08-23 ENCOUNTER — Ambulatory Visit: Payer: Medicare Other | Attending: Orthopedic Surgery

## 2021-08-23 ENCOUNTER — Ambulatory Visit (INDEPENDENT_AMBULATORY_CARE_PROVIDER_SITE_OTHER): Payer: Medicare Other

## 2021-08-23 DIAGNOSIS — Z23 Encounter for immunization: Secondary | ICD-10-CM

## 2021-08-23 DIAGNOSIS — M6281 Muscle weakness (generalized): Secondary | ICD-10-CM | POA: Insufficient documentation

## 2021-08-23 DIAGNOSIS — Z780 Asymptomatic menopausal state: Secondary | ICD-10-CM | POA: Diagnosis not present

## 2021-08-23 DIAGNOSIS — M25611 Stiffness of right shoulder, not elsewhere classified: Secondary | ICD-10-CM | POA: Diagnosis not present

## 2021-08-23 NOTE — Therapy (Signed)
[facility] [place], [place], [address] Phone: [phone]   Fax:  [phone]  Physical Therapy Evaluation  Patient Details  Name: Cassidy Barber MRN: [id number] Date of Birth: 11-Aug-1951 Referring Provider (PT): Supple   Encounter Date: 08/23/2021   PT End of Session - 08/23/21 0938     Visit Number 1    Number of Visits 13    Date for PT Re-Evaluation 10/04/21    PT Start Time 0945    PT Stop Time 1022    PT Time Calculation (min) 37 min    Activity Tolerance Patient tolerated treatment well;No increased pain    Behavior During Therapy [facility] for tasks assessed/performed             Past Medical History:  Diagnosis Date   Abdominal wall hernia    Incarcerated status post surgical repair 2019 - [facility]   Anemia of chronic disease    Atypical nevus 01/21/2018   atypical neoplasm- Left scalp-ant (txpbx + MOHS), atypical neoplasm- Left scalp post- (txpbx + MOHS)   Basal cell carcinoma    Colon cancer ([place])    Colon surgery 2005 and 2012   History of pulmonary hypertension    Pre liver transplant   Hypertension    Hypothyroidism    Lynch syndrome    Osteopenia    Primary biliary cirrhosis ([place])    Status post liver transplantation - follows at [facility] (squamous cell carcinoma) of skin 02/23/2020   Right Upper Chest(moderate) (MOH's)   SCCA (squamous cell carcinoma) of skin 04/07/2020   Left Top Leg (Keratoacanthoma) treatment after biopsy   SCCA (squamous cell carcinoma) of skin 04/07/2020   Left Foot Dorsal (in situ) treatment after biopsy   SCCA (squamous cell carcinoma) of skin 03/27/2021   Right Upper Back (Keratoacanthoma) (excision) (clear)   SCCA (squamous cell carcinoma) of skin 06/13/2021   Right Shoulder - anterior (moderately differentiated) (tx p bx)   SCCA (squamous cell carcinoma) of skin 06/13/2021   Right Thigh - anterior (well differentiated) (tx p bx)   SCCA (squamous cell carcinoma) of  skin 06/13/2021   Right Lower Leg - anterior (well differentiated) (tx p bx)   Squamous cell carcinoma of skin 04/22/2018   KA-Right mid chest (txpbx), KA-left elbow crease (txpbx), insitu-Right mid chest inf. (exc)   Squamous cell carcinoma of skin 05/20/2018   well diff-Left upper shin (txpbx), well diff-Right lower forearm (txpbx), well diff-Right upper shin (txpbx)   Squamous cell carcinoma of skin 06/11/2018   Scc + margin-Right mid chest inferior    Squamous cell carcinoma of skin 06/27/2018   well diff-Left mid thigh(txpbx), well diff-Left inner thigh (txpbx),insitu-Right cheek (txpbx),well diff-right inner heel (txpbx)   Squamous cell carcinoma of skin 09/16/2018   well diff-left shoulder (txpbx), well diff-Right chin (txpbx), well diff-right chest lateral (CX35FU)   Squamous cell carcinoma of skin 10/01/2018   Right outer lower shin (Txpbx)   Squamous cell carcinoma of skin 04/03/2019   well diff-Right center chest (MOHS), in situ-Right ear   Squamous cell carcinoma of skin 08/05/2019   in situ-Left calf (txpbx), in situ-left bicep (txpbx), well diff-Left chest,inf(txpbx), in situ-Right chest inf-(txpbx)   Squamous cell carcinoma of skin 11/19/2019   KA-left top leg (txpbx), modify-Riight forehead-(mohs), in situ-right hand (txpbx), in situ-Right forearm (txpbx), well diff-Right chest (txpbx), well diff-chin (txpbx)   Squamous cell carcinoma of skin 01/06/2020   KA- Left top leg   Squamous cell  carcinoma of skin 05/12/2013   bowens-middle of chest (CX35FU)   Squamous cell carcinoma of skin 05/18/2015   well diff-Left upper arm (CX35FU + Exc),KA-right chest(txpbx), in situ-Left shin (txpbx), well diff-Right cheek (CX35FU), KA-Left post scalp (CX35FU)   Squamous cell carcinoma of skin 08/09/2015   KA-Left post scalp ((MOHS), in situ- mid chest (Txpbx +exc), in situ-Left upper arm inferior (txpbx)   Squamous cell carcinoma of skin 10/13/2015   Left upper arm-clear   Squamous cell  carcinoma of skin 03/09/2016   mod diff-mid chest (txpbx+ exc), mod diff-Right chest (txpbx+exc), well diff-right cheek-(txpbx),well diff-Left hand-(txpbx), in situ-Left upper arm (txpbx), well diff-Right cheek -(txpbx), well diff-Right crease arm (txpbx)    Squamous cell carcinoma of skin 05/24/2016   well diff-Right nasal crease-(MOHS)   Squamous cell carcinoma of skin 08/02/2016   KA-Left chest med (txpbx)   Squamous cell carcinoma of skin 08/30/2016   in situ-Left outer zygoma (txpbx)   Squamous cell carcinoma of skin 12/06/2016   well diff-Left chest sup, Left shoudler, insitu- right post scalp   Squamous cell carcinoma of skin 02/14/2017   well diff-Left forearm (EXC),in situ-RIght ant neck   Squamous cell carcinoma of skin 06/14/2017   in situ-Right forearm (txpbx), in situ-Right chest (txpbx), well diff-left chest (txpbx), well diff-anterior neck- (txpbx)   Squamous cell carcinoma of skin 08/07/2017   well diff-Left upper shoulder (txpbx), sup and invasive-Left temple (txpbx), well diff-Right upper shin (txpbx), in situ-Right clavicle (txpbx)   Squamous cell carcinoma of skin 10/17/2017   well diff-ant. neck (MOHS), in situ-Right chest, inf (txpbx)   Squamous cell carcinoma of skin 01/21/2018   well diff- Right chest,ulnar (txpbx), well diff- right upper chest (txpbx), in situ-Right ant. crown (txpbx)   Squamous cell carcinoma of skin 08/02/2020   well diff-left lower leg-inferior (Txpbx)   Squamous cell carcinoma of skin 08/02/2020   well diff-right lower leg-mid (txpbx)   Squamous cell carcinoma of skin 08/02/2020   well diff-left chest upper   Squamous cell carcinoma of skin 08/02/2020   well diff-mid parietal scalp (MOHS)   Squamous cell carcinoma of skin 08/02/2020   well diff-right foot inner(txpbx)   Squamous cell carcinoma of skin 08/02/2020   well diff- left lower leg medial (txpbx)   Squamous cell carcinoma of skin 08/02/2020   well diff-left lower leg anterior  (txpbx)   Squamous cell carcinoma of skin 08/02/2020   well diff-left lower leg medial (txpbx)   Squamous cell carcinoma of skin 08/02/2020   well diff-right forearm-posterior (txpbx)    Past Surgical History:  Procedure Laterality Date   ABDOMINAL HERNIA REPAIR     Patient's states that she has had 8- 9 hernia surgeries   ABDOMINAL HYSTERECTOMY     BIOPSY  02/15/2021   Procedure: BIOPSY;  Surgeon: Rogene Houston, MD;  [facility];  Service: Endoscopy;;   CATARACT EXTRACTION W/PHACO Right 01/15/2020   Procedure: CATARACT EXTRACTION PHACO AND INTRAOCULAR LENS PLACEMENT ([place]);  Surgeon: Baruch Goldmann, MD;  Location: [facility];  Service: Ophthalmology;  Laterality: Right;  CDE: 7.89   CATARACT EXTRACTION W/PHACO Left 01/29/2020   Procedure: CATARACT EXTRACTION PHACO AND INTRAOCULAR LENS PLACEMENT (I[facility]) (CDE: 6.33);  Surgeon: Baruch Goldmann, MD;  Location: [facility];  Service: Ophthalmology;  Laterality: Left;   CHOLECYSTECTOMY  2007   COLON SURGERY  2008   Done at [facility]   COLONOSCOPY     Done at [facility]   ESOPHAGOGASTRODUODENOSCOPY N/A 08/21/2018   Procedure: ESOPHAGOGASTRODUODENOSCOPY (  EGD);  Surgeon: Rogene Houston, MD;  [facility];  Service: Endoscopy;  Laterality: N/A;   ESOPHAGOGASTRODUODENOSCOPY (EGD) WITH PROPOFOL N/A 12/16/2019   Procedure: ESOPHAGOGASTRODUODENOSCOPY (EGD) WITH PROPOFOL;  Surgeon: Rogene Houston, MD;  [facility];  Service: Endoscopy;  Laterality: N/A;   EYE SURGERY     lasix   FLEXIBLE SIGMOIDOSCOPY N/A 10/20/2015   Procedure: FLEXIBLE SIGMOIDOSCOPY;  Surgeon: Rogene Houston, MD;  [facility];  Service: Endoscopy;  Laterality: N/A;  40 - Dr Laural Golden has meeting until 1:00   FLEXIBLE SIGMOIDOSCOPY N/A 07/11/2016   Procedure: FLEXIBLE SIGMOIDOSCOPY;  Surgeon: Rogene Houston, MD;  [facility];  Service: Endoscopy;  Laterality: N/A;  [place] N/A 08/09/2017   Procedure: FLEXIBLE  SIGMOIDOSCOPY;  Surgeon: Rogene Houston, MD;  [facility];  Service: Endoscopy;  Laterality: N/A;  1:00   FLEXIBLE SIGMOIDOSCOPY N/A 08/21/2018   Procedure: FLEXIBLE SIGMOIDOSCOPY;  Surgeon: Rogene Houston, MD;  [facility];  Service: Endoscopy;  Laterality: N/A;   FLEXIBLE SIGMOIDOSCOPY N/A 12/16/2019   Procedure: FLEXIBLE SIGMOIDOSCOPY wirh Propofol;  Surgeon: Rogene Houston, MD;  [facility];  Service: Endoscopy;  Laterality: N/A;  7:30   FLEXIBLE SIGMOIDOSCOPY N/A 02/15/2021   Procedure: FLEXIBLE SIGMOIDOSCOPY WITH PROPOFOL;  Surgeon: Rogene Houston, MD;  [facility];  Service: Endoscopy;  Laterality: N/A;  am   FRACTURE SURGERY     right wrist metal plate   LIVER TRANSPLANT  11/19/2013   POLYPECTOMY  08/09/2017   Procedure: POLYPECTOMY;  Surgeon: Rogene Houston, MD;  [facility];  Service: Endoscopy;;  colon small bowel   REVERSE SHOULDER ARTHROPLASTY Left 07/17/2018   Procedure: LEFT REVERSE SHOULDER ARTHROPLASTY;  Surgeon: Justice Britain, MD;  Location: [facility];  Service: Orthopedics;  Laterality: Left;  156min   REVERSE SHOULDER ARTHROPLASTY Right 07/20/2021   Procedure: REVERSE SHOULDER ARTHROPLASTY;  Surgeon: Justice Britain, MD;  Location: [facility];  Service: Orthopedics;  Laterality: Right;   SHOULDER CLOSED REDUCTION Left 09/27/2019   Procedure: CLOSED REDUCTION SHOULDER;  Surgeon: Paralee Cancel, MD;  Location: [facility];  Service: Orthopedics;  Laterality: Left;   SPLENECTOMY  2006   TOTAL SHOULDER REVISION Left 11/12/2019   Procedure: Revision Left Reverse Shoulder Arthroplasty with poly exchange SDD;  Surgeon: Justice Britain, MD;  Location: [facility];  Service: Orthopedics;  Laterality: Left;  181min -SDDC   TYMPANOSTOMY TUBE PLACEMENT     UPPER GASTROINTESTINAL ENDOSCOPY     Done at [facility]    There were no vitals filed for this visit.    Subjective Assessment - 08/23/21 0945     Subjective Patient reports that she had  a reverse total shoulder replacement on 9/8. She notes that she has begun to use it for activities such putting her hair up. However, she has to lean forward to perform this activity. She reports that she is right handed and has been unable to do most of her daily activities. She notes that she is going out of town on the week of 10/24.    Pertinent History Reverse TSA (L: 2021) (R: 9/8), history of skin cancer    Limitations Lifting;Writing    Patient Stated Goals make crafts, reaching overhead,    Currently in Pain? Yes    Pain Score 0-No pain    Pain Location Shoulder    Pain Orientation Right    Pain Type Surgical pain  Pain Onset More than a month ago    Pain Frequency Rarely    Aggravating Factors  "overdoing it"    Pain Relieving Factors laying down and resting arm    Effect of Pain on Daily Activities unable to perform most daily activities                [facility] PT Assessment - 08/23/21 0001       Assessment   Medical Diagnosis Right Reverse TSA    Referring Provider (PT) Supple    Onset Date/Surgical Date 07/20/21    Hand Dominance Right    Next MD Visit 08/28/21    Prior Therapy Yes      Precautions   Precautions Shoulder    Type of Shoulder Precautions Reverse TSA    Precaution Comments See Protocol    Required Braces or Orthoses --      Restrictions   Weight Bearing Restrictions Yes    RUE Weight Bearing Non weight bearing      Balance Screen   Has the patient fallen in the past 6 months No    Has the patient had a decrease in activity level because of a fear of falling?  Yes    Is the patient reluctant to leave their home because of a fear of falling?  No      Home Ecologist residence      Prior Function   Level of Independence Independent    Vocation Retired    Leisure Copy   Overall Cognitive Status Within Functional Limits for tasks assessed    Attention Focused    Focused Attention Appears  intact    Memory Appears intact    Awareness Appears intact    Problem Solving Appears intact      Observation/Other Assessments   Focus on Therapeutic Outcomes (FOTO)  Completed      Sensation   Light Touch Appears Intact      ROM / Strength   AROM / PROM / Strength AROM;PROM      AROM   AROM Assessment Site Shoulder    Right/Left Shoulder Right;Left    Right Shoulder Flexion 74 Degrees    Right Shoulder ABduction 65 Degrees    Left Shoulder Flexion 125 Degrees    Left Shoulder ABduction 115 Degrees    Left Shoulder Internal Rotation --   abdomen   Left Shoulder External Rotation 77 Degrees      PROM   PROM Assessment Site Shoulder    Right/Left Shoulder Right    Right Shoulder Flexion 125 Degrees    Right Shoulder ABduction 115 Degrees      Palpation   Palpation comment biceps (tender to palpation)                        Objective measurements completed on examination: See above findings.       [facility] Adult PT Treatment/Exercise - 08/23/21 0001       Exercises   Exercises Shoulder      Shoulder Exercises: Seated   Retraction 20 reps;Both    Other Seated Exercises Bicep curl   20 reps; RUE   Other Seated Exercises Towel gripping   20 reps; RUE                    PT Education - 08/23/21 1039     Education Details HEP, POC  Person(s) Educated Patient    Methods Explanation    Comprehension Verbalized understanding              PT Short Term Goals - 08/23/21 1050       PT SHORT TERM GOAL #1   Title Patient will be able to demonstrate at least 60 degrees of shoulder flexion for improved function reaching.    Time 3    Period Weeks    Status New    Target Date 09/13/21      PT SHORT TERM GOAL #2   Title Patient will be able to demonstrate at least 60 degrees of shoulder abduction for improved function reaching for her daily activities.    Time 3    Period Weeks    Status New    Target Date 09/13/21                PT Long Term Goals - 08/23/21 1054       PT LONG TERM GOAL #1   Title Patient will be independent with her HEP.    Time 6    Period Weeks    Status New    Target Date 10/04/21      PT LONG TERM GOAL #2   Title Patient will be able to demonstrate at least 120 degrees of active shoulder flexion for improved function reaching overhead.    Time 6    Period Weeks    Status New    Target Date 10/04/21      PT LONG TERM GOAL #3   Title Patient will be able to demonstrate at least 120 degrees of active shoulder abduction for improved function reaching.    Time 6    Period Weeks    Status New    Target Date 10/04/21                    Plan - 08/23/21 5638     Clinical Impression Statement Patient is a 70 year old female presented to physical therapy with right shoulder stiffness and weakness following a right reverse total shoulder arthroplasty on 07/20/21. She presented with low pain severity and irritability today. However, her progress may be slightly limited initially as she was told to hold therapy initially following skin surgery. Recommend that she continue with her plan of care to address her remaining impairments to return to her prior level of function.    Personal Factors and Comorbidities Other;Time since onset of injury/illness/exacerbation    Examination-Activity Limitations Bathing;Reach Overhead;Carry;Dressing;Hygiene/Grooming;Lift    Examination-Participation Restrictions Community Activity;Shop;Yard Work    Stability/Clinical Decision Making Stable/Uncomplicated    Clinical Decision Making Low    Rehab Potential Good    PT Frequency 2x / week   3x per week on the week of 10/17   PT Duration 6 weeks    PT Treatment/Interventions Electrical Stimulation;Neuromuscular re-education;Therapeutic exercise;Therapeutic activities;Patient/family education;Manual techniques;Energy conservation;Passive range of motion;Scar mobilization;Taping    PT Next Visit Plan  DOS: 07/20/21; see protocol; isometrics    PT Home Exercise Plan scapular retraction, bicep curl, and towel squeeze    Consulted and Agree with Plan of Care Patient             Patient will benefit from skilled therapeutic intervention in order to improve the following deficits and impairments:  Decreased range of motion, Impaired UE functional use, Pain, Decreased strength  Visit Diagnosis: Stiffness of right shoulder, not elsewhere classified - Plan: PT plan of care cert/re-cert  Muscle weakness (generalized) - Plan: PT plan of care cert/re-cert     Problem List Patient Active Problem List   Diagnosis Date Noted   Elevated alkaline phosphatase level 02/02/2021   S/P reverse total shoulder arthroplasty, left 11/12/2019   AKI (acute kidney injury) ([place]) 10/05/2019   Anterior dislocation of left shoulder 09/26/2019   Hx of colonic polyps 09/04/2019   Chest pain 09/04/2019   Closed fracture of shaft of right ulna 07/08/2019   Fracture of shaft of ulna 07/03/2019   Benzodiazepine dependence ([place]) 04/13/2019   Controlled substance agreement signed 04/13/2019   Urticaria 09/08/2018   S/p reverse total shoulder arthroplasty 07/17/2018   History of colon cancer 06/12/2018   History of colonic polyps 06/12/2018   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   Gastroesophageal reflux disease 06/12/2018   Rotator cuff tear arthropathy 05/28/2018   Hypertension 04/08/2018   Chronic diarrhea 06/26/2017   Iron deficiency anemia 06/26/2017   GAD (generalized anxiety disorder) 08/28/2016   Vitamin D deficiency 08/28/2016   Insomnia 08/28/2016   Immunosuppression ([place]) 12/02/2015   Liver transplant recipient [facility]) 11/30/2015   Seizures ([place]) 09/27/2015   Hx of liver transplant ([place]) 12/08/2014   Colon cancer ([place]) 12/08/2014   Adenocarcinoma in a polyp ([place]) 09/17/2014   Portal vein thrombosis 11/19/2013   Hepatic encephalopathy 10/26/2013   Personal history of colonic polyps 12/27/2012    Hypothyroidism 03/24/2012   Ventral hernia, recurrent 03/24/2012   Hernia 09/04/2011   Other secondary pulmonary hypertension ([place]) 08/16/2011    Darlin Coco, PT 08/23/2021, 11:03 AM  [facility], [place], [address] Phone: [phone]   Fax:  [phone]  Name: Cassidy Barber MRN: [id number] Date of Birth: [birth year]

## 2021-08-25 ENCOUNTER — Ambulatory Visit: Payer: Medicare Other

## 2021-08-25 ENCOUNTER — Other Ambulatory Visit: Payer: Self-pay

## 2021-08-25 DIAGNOSIS — M810 Age-related osteoporosis without current pathological fracture: Secondary | ICD-10-CM | POA: Diagnosis not present

## 2021-08-25 DIAGNOSIS — Z780 Asymptomatic menopausal state: Secondary | ICD-10-CM | POA: Diagnosis not present

## 2021-08-25 DIAGNOSIS — M25611 Stiffness of right shoulder, not elsewhere classified: Secondary | ICD-10-CM | POA: Diagnosis not present

## 2021-08-25 DIAGNOSIS — M6281 Muscle weakness (generalized): Secondary | ICD-10-CM | POA: Diagnosis not present

## 2021-08-25 NOTE — Therapy (Signed)
[facility] [place], [place], [address] Phone: [phone]   Fax:  [phone]  Physical Therapy Treatment  Patient Details  Name: Cassidy Barber MRN: [id number] Date of Birth: August 28, 1951 Referring Provider (PT): Supple   Encounter Date: 08/25/2021   PT End of Session - 08/25/21 0814     Visit Number 2    Number of Visits 13    Date for PT Re-Evaluation 10/04/21    PT Start Time 0810    PT Stop Time 0858    PT Time Calculation (min) 48 min    Activity Tolerance Patient tolerated treatment well;No increased pain    Behavior During Therapy [facility] for tasks assessed/performed             Past Medical History:  Diagnosis Date   Abdominal wall hernia    Incarcerated status post surgical repair 2019 - [facility]   Anemia of chronic disease    Atypical nevus 01/21/2018   atypical neoplasm- Left scalp-ant (txpbx + MOHS), atypical neoplasm- Left scalp post- (txpbx + MOHS)   Basal cell carcinoma    Colon cancer ([place])    Colon surgery 2005 and 2012   History of pulmonary hypertension    Pre liver transplant   Hypertension    Hypothyroidism    Lynch syndrome    Osteopenia    Primary biliary cirrhosis ([place])    Status post liver transplantation - follows at [facility] (squamous cell carcinoma) of skin 02/23/2020   Right Upper Chest(moderate) (MOH's)   SCCA (squamous cell carcinoma) of skin 04/07/2020   Left Top Leg (Keratoacanthoma) treatment after biopsy   SCCA (squamous cell carcinoma) of skin 04/07/2020   Left Foot Dorsal (in situ) treatment after biopsy   SCCA (squamous cell carcinoma) of skin 03/27/2021   Right Upper Back (Keratoacanthoma) (excision) (clear)   SCCA (squamous cell carcinoma) of skin 06/13/2021   Right Shoulder - anterior (moderately differentiated) (tx p bx)   SCCA (squamous cell carcinoma) of skin 06/13/2021   Right Thigh - anterior (well differentiated) (tx p bx)   SCCA (squamous cell carcinoma) of  skin 06/13/2021   Right Lower Leg - anterior (well differentiated) (tx p bx)   Squamous cell carcinoma of skin 04/22/2018   KA-Right mid chest (txpbx), KA-left elbow crease (txpbx), insitu-Right mid chest inf. (exc)   Squamous cell carcinoma of skin 05/20/2018   well diff-Left upper shin (txpbx), well diff-Right lower forearm (txpbx), well diff-Right upper shin (txpbx)   Squamous cell carcinoma of skin 06/11/2018   Scc + margin-Right mid chest inferior    Squamous cell carcinoma of skin 06/27/2018   well diff-Left mid thigh(txpbx), well diff-Left inner thigh (txpbx),insitu-Right cheek (txpbx),well diff-right inner heel (txpbx)   Squamous cell carcinoma of skin 09/16/2018   well diff-left shoulder (txpbx), well diff-Right chin (txpbx), well diff-right chest lateral (CX35FU)   Squamous cell carcinoma of skin 10/01/2018   Right outer lower shin (Txpbx)   Squamous cell carcinoma of skin 04/03/2019   well diff-Right center chest (MOHS), in situ-Right ear   Squamous cell carcinoma of skin 08/05/2019   in situ-Left calf (txpbx), in situ-left bicep (txpbx), well diff-Left chest,inf(txpbx), in situ-Right chest inf-(txpbx)   Squamous cell carcinoma of skin 11/19/2019   KA-left top leg (txpbx), modify-Riight forehead-(mohs), in situ-right hand (txpbx), in situ-Right forearm (txpbx), well diff-Right chest (txpbx), well diff-chin (txpbx)   Squamous cell carcinoma of skin 01/06/2020   KA- Left top leg   Squamous cell  carcinoma of skin 05/12/2013   bowens-middle of chest (CX35FU)   Squamous cell carcinoma of skin 05/18/2015   well diff-Left upper arm (CX35FU + Exc),KA-right chest(txpbx), in situ-Left shin (txpbx), well diff-Right cheek (CX35FU), KA-Left post scalp (CX35FU)   Squamous cell carcinoma of skin 08/09/2015   KA-Left post scalp ((MOHS), in situ- mid chest (Txpbx +exc), in situ-Left upper arm inferior (txpbx)   Squamous cell carcinoma of skin 10/13/2015   Left upper arm-clear   Squamous cell  carcinoma of skin 03/09/2016   mod diff-mid chest (txpbx+ exc), mod diff-Right chest (txpbx+exc), well diff-right cheek-(txpbx),well diff-Left hand-(txpbx), in situ-Left upper arm (txpbx), well diff-Right cheek -(txpbx), well diff-Right crease arm (txpbx)    Squamous cell carcinoma of skin 05/24/2016   well diff-Right nasal crease-(MOHS)   Squamous cell carcinoma of skin 08/02/2016   KA-Left chest med (txpbx)   Squamous cell carcinoma of skin 08/30/2016   in situ-Left outer zygoma (txpbx)   Squamous cell carcinoma of skin 12/06/2016   well diff-Left chest sup, Left shoudler, insitu- right post scalp   Squamous cell carcinoma of skin 02/14/2017   well diff-Left forearm (EXC),in situ-RIght ant neck   Squamous cell carcinoma of skin 06/14/2017   in situ-Right forearm (txpbx), in situ-Right chest (txpbx), well diff-left chest (txpbx), well diff-anterior neck- (txpbx)   Squamous cell carcinoma of skin 08/07/2017   well diff-Left upper shoulder (txpbx), sup and invasive-Left temple (txpbx), well diff-Right upper shin (txpbx), in situ-Right clavicle (txpbx)   Squamous cell carcinoma of skin 10/17/2017   well diff-ant. neck (MOHS), in situ-Right chest, inf (txpbx)   Squamous cell carcinoma of skin 01/21/2018   well diff- Right chest,ulnar (txpbx), well diff- right upper chest (txpbx), in situ-Right ant. crown (txpbx)   Squamous cell carcinoma of skin 08/02/2020   well diff-left lower leg-inferior (Txpbx)   Squamous cell carcinoma of skin 08/02/2020   well diff-right lower leg-mid (txpbx)   Squamous cell carcinoma of skin 08/02/2020   well diff-left chest upper   Squamous cell carcinoma of skin 08/02/2020   well diff-mid parietal scalp (MOHS)   Squamous cell carcinoma of skin 08/02/2020   well diff-right foot inner(txpbx)   Squamous cell carcinoma of skin 08/02/2020   well diff- left lower leg medial (txpbx)   Squamous cell carcinoma of skin 08/02/2020   well diff-left lower leg anterior  (txpbx)   Squamous cell carcinoma of skin 08/02/2020   well diff-left lower leg medial (txpbx)   Squamous cell carcinoma of skin 08/02/2020   well diff-right forearm-posterior (txpbx)    Past Surgical History:  Procedure Laterality Date   ABDOMINAL HERNIA REPAIR     Patient's states that she has had 8- 9 hernia surgeries   ABDOMINAL HYSTERECTOMY     BIOPSY  02/15/2021   Procedure: BIOPSY;  Surgeon: Rogene Houston, MD;  [facility];  Service: Endoscopy;;   CATARACT EXTRACTION W/PHACO Right 01/15/2020   Procedure: CATARACT EXTRACTION PHACO AND INTRAOCULAR LENS PLACEMENT ([place]);  Surgeon: Baruch Goldmann, MD;  Location: [facility];  Service: Ophthalmology;  Laterality: Right;  CDE: 7.89   CATARACT EXTRACTION W/PHACO Left 01/29/2020   Procedure: CATARACT EXTRACTION PHACO AND INTRAOCULAR LENS PLACEMENT (I[facility]) (CDE: 6.33);  Surgeon: Baruch Goldmann, MD;  Location: [facility];  Service: Ophthalmology;  Laterality: Left;   CHOLECYSTECTOMY  2007   COLON SURGERY  2008   [facility] at [facility]   COLONOSCOPY     Done at [facility]   ESOPHAGOGASTRODUODENOSCOPY N/A 08/21/2018   Procedure: ESOPHAGOGASTRODUODENOSCOPY (  EGD);  Surgeon: Rogene Houston, MD;  [facility];  Service: Endoscopy;  Laterality: N/A;   ESOPHAGOGASTRODUODENOSCOPY (EGD) WITH PROPOFOL N/A 12/16/2019   Procedure: ESOPHAGOGASTRODUODENOSCOPY (EGD) WITH PROPOFOL;  Surgeon: Rogene Houston, MD;  [facility];  Service: Endoscopy;  Laterality: N/A;   EYE SURGERY     lasix   FLEXIBLE SIGMOIDOSCOPY N/A 10/20/2015   Procedure: FLEXIBLE SIGMOIDOSCOPY;  Surgeon: Rogene Houston, MD;  [facility];  Service: Endoscopy;  Laterality: N/A;  17 - Dr Laural Golden has meeting until 1:00   FLEXIBLE SIGMOIDOSCOPY N/A 07/11/2016   Procedure: FLEXIBLE SIGMOIDOSCOPY;  Surgeon: Rogene Houston, MD;  [facility];  Service: Endoscopy;  Laterality: N/A;  [place] N/A 08/09/2017   Procedure: FLEXIBLE  SIGMOIDOSCOPY;  Surgeon: Rogene Houston, MD;  [facility];  Service: Endoscopy;  Laterality: N/A;  1:00   FLEXIBLE SIGMOIDOSCOPY N/A 08/21/2018   Procedure: FLEXIBLE SIGMOIDOSCOPY;  Surgeon: Rogene Houston, MD;  [facility];  Service: Endoscopy;  Laterality: N/A;   FLEXIBLE SIGMOIDOSCOPY N/A 12/16/2019   Procedure: FLEXIBLE SIGMOIDOSCOPY wirh Propofol;  Surgeon: Rogene Houston, MD;  [facility];  Service: Endoscopy;  Laterality: N/A;  7:30   FLEXIBLE SIGMOIDOSCOPY N/A 02/15/2021   Procedure: FLEXIBLE SIGMOIDOSCOPY WITH PROPOFOL;  Surgeon: Rogene Houston, MD;  [facility];  Service: Endoscopy;  Laterality: N/A;  am   FRACTURE SURGERY     right wrist metal plate   LIVER TRANSPLANT  11/19/2013   POLYPECTOMY  08/09/2017   Procedure: POLYPECTOMY;  Surgeon: Rogene Houston, MD;  [facility];  Service: Endoscopy;;  colon small bowel   REVERSE SHOULDER ARTHROPLASTY Left 07/17/2018   Procedure: LEFT REVERSE SHOULDER ARTHROPLASTY;  Surgeon: Justice Britain, MD;  Location: [place];  Service: Orthopedics;  Laterality: Left;  159min   REVERSE SHOULDER ARTHROPLASTY Right 07/20/2021   Procedure: REVERSE SHOULDER ARTHROPLASTY;  Surgeon: Justice Britain, MD;  [facility];  Service: Orthopedics;  Laterality: Right;   SHOULDER CLOSED REDUCTION Left 09/27/2019   Procedure: CLOSED REDUCTION SHOULDER;  Surgeon: Paralee Cancel, MD;  Location[facility];  Service: Orthopedics;  Laterality: Left;   SPLENECTOMY  2006   TOTAL SHOULDER REVISION Left 11/12/2019   Procedure: Revision Left Reverse Shoulder Arthroplasty with poly exchange SDD;  Surgeon: Justice Britain, MD;  [facility];  Service: Orthopedics;  Laterality: Left;  139min -SDDC   TYMPANOSTOMY TUBE PLACEMENT     UPPER GASTROINTESTINAL ENDOSCOPY     Done at [facility]    There were no vitals filed for this visit.   Subjective Assessment - 08/25/21 0811     Subjective Pt completed COVID screen  prior to arriving.  Pt declined any pain upon arrival.  Some mild soreness with certain activities at home.    Pertinent History Reverse TSA (L: 2021) (R: 9/8), history of skin cancer    Limitations Lifting;Writing    Patient Stated Goals make crafts, reaching overhead,    Currently in Pain? No/denies                               [facility] Adult PT Treatment/Exercise - 08/25/21 0813       Exercises   Exercises Shoulder;Wrist      Shoulder Exercises: Supine   Other Supine Exercises Towel squeezes 2 sets x 15 reps      Shoulder  Exercises: Seated   Retraction Both;20 reps    Retraction Limitations Cues for posture    External Rotation Strengthening;Right   2 sets x 15 reps   External Rotation Weight (lbs) 1    Flexion Right;10 reps   5 secs hold   Abduction Strengthening;Right;10 reps   5 sec hold   Other Seated Exercises Shoulder shrugs 20 reps    Other Seated Exercises Biceps curls 2 sets 15 reps 1#      Shoulder Exercises: Pulleys   Flexion 2 minutes    ABduction 2 minutes      Shoulder Exercises: Isometric Strengthening   Flexion Other (comment)   Standing 2 sets x 10 reps 3 sec hold   Extension Other (comment)   Standing 2 sets x 10 reps 3 sec hold   External Rotation Other (comment)   Standing 2 sets x 10 reps 3 sec hold     Wrist Exercises   Wrist Flexion Right    Bar Weights/Barbell (Wrist Flexion) 1 lb    Wrist Flexion Limitations 2 sets x 15    Wrist Extension Right    Bar Weights/Barbell (Wrist Extension) 1 lb    Wrist Extension Limitations 2 sets x 15      Manual Therapy   Manual Therapy Soft tissue mobilization    Manual therapy comments R bicep for muscle soreness and tightness.                       PT Short Term Goals - 08/25/21 0931       PT SHORT TERM GOAL #1   Title Patient will be able to demonstrate at least 60 degrees of shoulder flexion for improved function reaching.    Time 3    Period Weeks    Status  On-going    Target Date 09/13/21      PT SHORT TERM GOAL #2   Title Patient will be able to demonstrate at least 60 degrees of shoulder abduction for improved function reaching for her daily activities.    Time 3    Period Weeks    Status On-going    Target Date 09/13/21               PT Long Term Goals - 08/25/21 0931       PT LONG TERM GOAL #1   Title Patient will be independent with her HEP.    Time 6    Period Weeks    Status On-going      PT LONG TERM GOAL #2   Title Patient will be able to demonstrate at least 120 degrees of active shoulder flexion for improved function reaching overhead.    Time 6    Period Weeks    Status On-going      PT LONG TERM GOAL #3   Title Patient will be able to demonstrate at least 120 degrees of active shoulder abduction for improved function reaching.    Time 6    Period Weeks    Status On-going      PT LONG TERM GOAL #4   Status On-going      PT LONG TERM GOAL #5   Status On-going                   Plan - 08/25/21 0814     Clinical Impression Statement Pt arrived to therapy with know pain in R shoulder.  Pt with mild tightness near proximal end of  right bicep, some relief with STM at compleetion of treamtnet session.  Added right shoulder isometrics and shoulder shrugs to HEP.  Pt requiring significant cues to avoid compensatory movements with seated shoulder flexion and abduction, requiring tactile cues to correct.  Pt would benefit with continuation of her plan of care to address her remaining impairtments to return to her PLOF.    Personal Factors and Comorbidities Other;Time since onset of injury/illness/exacerbation    Examination-Activity Limitations Bathing;Reach Overhead;Carry;Dressing;Hygiene/Grooming;Lift    Examination-Participation Restrictions Community Activity;Shop;Yard Work    Stability/Clinical Decision Making Stable/Uncomplicated    Rehab Potential Good    PT Frequency 2x / week   3x per week on the  week of 10/17   PT Duration 6 weeks    PT Treatment/Interventions Electrical Stimulation;Neuromuscular re-education;Therapeutic exercise;Therapeutic activities;Patient/family education;Manual techniques;Energy conservation;Passive range of motion;Scar mobilization;Taping    PT Next Visit Plan DOS: 07/20/21; see protocol; isometrics    PT Home Exercise Plan scapular retraction, bicep curl, and towel squeeze    Consulted and Agree with Plan of Care Patient             Patient will benefit from skilled therapeutic intervention in order to improve the following deficits and impairments:  Decreased range of motion, Impaired UE functional use, Pain, Decreased strength  Visit Diagnosis: Stiffness of right shoulder, not elsewhere classified     Problem List Patient Active Problem List   Diagnosis Date Noted   Elevated alkaline phosphatase level 02/02/2021   S/P reverse total shoulder arthroplasty, left 11/12/2019   AKI (acute kidney injury) ([place]) 10/05/2019   Anterior dislocation of left shoulder 09/26/2019   Hx of colonic polyps 09/04/2019   Chest pain 09/04/2019   Closed fracture of shaft of right ulna 07/08/2019   Fracture of shaft of ulna 07/03/2019   Benzodiazepine dependence ([place]) 04/13/2019   Controlled substance agreement signed 04/13/2019   Urticaria 09/08/2018   S/p reverse total shoulder arthroplasty 07/17/2018   History of colon cancer 06/12/2018   History of colonic polyps 06/12/2018   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   Gastroesophageal reflux disease 06/12/2018   Rotator cuff tear arthropathy 05/28/2018   Hypertension 04/08/2018   Chronic diarrhea 06/26/2017   Iron deficiency anemia 06/26/2017   GAD (generalized anxiety disorder) 08/28/2016   Vitamin D deficiency 08/28/2016   Insomnia 08/28/2016   Immunosuppression ([place]) 12/02/2015   Liver transplant recipient [facility]) 11/30/2015   Seizures ([place]) 09/27/2015   Hx of liver transplant ([place]) 12/08/2014   Colon cancer ([place])  12/08/2014   Adenocarcinoma in a polyp ([place]) 09/17/2014   Portal vein thrombosis 11/19/2013   Hepatic encephalopathy 10/26/2013   Personal history of colonic polyps 12/27/2012   Hypothyroidism 03/24/2012   Ventral hernia, recurrent 03/24/2012   Hernia 09/04/2011   Other secondary pulmonary hypertension ([place]) 08/16/2011    Kathrynn Ducking, PTA 08/25/2021, 9:33 AM  [facility] [place], [place], [address] Phone: [phone]   Fax:  [phone]  Name: Cassidy Barber MRN: [id number] Date of Birth: 1951-11-10

## 2021-08-29 ENCOUNTER — Ambulatory Visit (INDEPENDENT_AMBULATORY_CARE_PROVIDER_SITE_OTHER): Payer: Medicare Other | Admitting: Nurse Practitioner

## 2021-08-29 ENCOUNTER — Other Ambulatory Visit: Payer: Self-pay

## 2021-08-29 ENCOUNTER — Ambulatory Visit: Payer: Medicare Other

## 2021-08-29 ENCOUNTER — Encounter: Payer: Self-pay | Admitting: Nurse Practitioner

## 2021-08-29 VITALS — BP 128/74 | HR 80 | Temp 97.8°F | Ht 61.0 in | Wt 144.0 lb

## 2021-08-29 DIAGNOSIS — M6281 Muscle weakness (generalized): Secondary | ICD-10-CM

## 2021-08-29 DIAGNOSIS — R300 Dysuria: Secondary | ICD-10-CM | POA: Diagnosis not present

## 2021-08-29 DIAGNOSIS — M25611 Stiffness of right shoulder, not elsewhere classified: Secondary | ICD-10-CM

## 2021-08-29 LAB — URINALYSIS, ROUTINE W REFLEX MICROSCOPIC
Bilirubin, UA: NEGATIVE
Glucose, UA: NEGATIVE
Nitrite, UA: NEGATIVE
Specific Gravity, UA: 1.02 (ref 1.005–1.030)
Urobilinogen, Ur: 0.2 mg/dL (ref 0.2–1.0)
pH, UA: 5.5 (ref 5.0–7.5)

## 2021-08-29 LAB — MICROSCOPIC EXAMINATION
RBC, Urine: >30 /hpf — AB (ref 0–2)
Renal Epithel, UA: NONE SEEN /hpf
WBC, UA: >30 /hpf — AB (ref 0–5)

## 2021-08-29 MED ORDER — CEPHALEXIN 500 MG PO CAPS: 500.0000 mg | ORAL_CAPSULE | Freq: Two times a day (BID) | ORAL | 0 refills | Status: DC

## 2021-08-29 NOTE — Therapy (Signed)
[facility] [place], [place], [address] Phone: [phone]   Fax:  [phone]  Physical Therapy Treatment  Patient Details  Name: Cassidy Barber MRN: [id number] Date of Birth: 02/27/1951 Referring Provider (PT): Supple   Encounter Date: 08/29/2021   PT End of Session - 08/29/21 0814     Visit Number 3    Number of Visits 13    Date for PT Re-Evaluation 10/04/21    PT Start Time 0816    PT Stop Time 0859    PT Time Calculation (min) 43 min    Activity Tolerance Patient tolerated treatment well;No increased pain    Behavior During Therapy [facility] for tasks assessed/performed             Past Medical History:  Diagnosis Date   Abdominal wall hernia    Incarcerated status post surgical repair 2019 - [facility]   Anemia of chronic disease    Atypical nevus 01/21/2018   atypical neoplasm- Left scalp-ant (txpbx + MOHS), atypical neoplasm- Left scalp post- (txpbx + MOHS)   Basal cell carcinoma    Colon cancer ([place])    Colon surgery 2005 and 2012   History of pulmonary hypertension    Pre liver transplant   Hypertension    Hypothyroidism    Lynch syndrome    Osteopenia    Primary biliary cirrhosis ([place])    Status post liver transplantation - follows at [facility] (squamous cell carcinoma) of skin 02/23/2020   Right Upper Chest(moderate) (MOH's)   SCCA (squamous cell carcinoma) of skin 04/07/2020   Left Top Leg (Keratoacanthoma) treatment after biopsy   SCCA (squamous cell carcinoma) of skin 04/07/2020   Left Foot Dorsal (in situ) treatment after biopsy   SCCA (squamous cell carcinoma) of skin 03/27/2021   Right Upper Back (Keratoacanthoma) (excision) (clear)   SCCA (squamous cell carcinoma) of skin 06/13/2021   Right Shoulder - anterior (moderately differentiated) (tx p bx)   SCCA (squamous cell carcinoma) of skin 06/13/2021   Right Thigh - anterior (well differentiated) (tx p bx)   SCCA (squamous cell carcinoma) of  skin 06/13/2021   Right Lower Leg - anterior (well differentiated) (tx p bx)   Squamous cell carcinoma of skin 04/22/2018   KA-Right mid chest (txpbx), KA-left elbow crease (txpbx), insitu-Right mid chest inf. (exc)   Squamous cell carcinoma of skin 05/20/2018   well diff-Left upper shin (txpbx), well diff-Right lower forearm (txpbx), well diff-Right upper shin (txpbx)   Squamous cell carcinoma of skin 06/11/2018   Scc + margin-Right mid chest inferior    Squamous cell carcinoma of skin 06/27/2018   well diff-Left mid thigh(txpbx), well diff-Left inner thigh (txpbx),insitu-Right cheek (txpbx),well diff-right inner heel (txpbx)   Squamous cell carcinoma of skin 09/16/2018   well diff-left shoulder (txpbx), well diff-Right chin (txpbx), well diff-right chest lateral (CX35FU)   Squamous cell carcinoma of skin 10/01/2018   Right outer lower shin (Txpbx)   Squamous cell carcinoma of skin 04/03/2019   well diff-Right center chest (MOHS), in situ-Right ear   Squamous cell carcinoma of skin 08/05/2019   in situ-Left calf (txpbx), in situ-left bicep (txpbx), well diff-Left chest,inf(txpbx), in situ-Right chest inf-(txpbx)   Squamous cell carcinoma of skin 11/19/2019   KA-left top leg (txpbx), modify-Riight forehead-(mohs), in situ-right hand (txpbx), in situ-Right forearm (txpbx), well diff-Right chest (txpbx), well diff-chin (txpbx)   Squamous cell carcinoma of skin 01/06/2020   KA- Left top leg   Squamous cell  carcinoma of skin 05/12/2013   bowens-middle of chest (CX35FU)   Squamous cell carcinoma of skin 05/18/2015   well diff-Left upper arm (CX35FU + Exc),KA-right chest(txpbx), in situ-Left shin (txpbx), well diff-Right cheek (CX35FU), KA-Left post scalp (CX35FU)   Squamous cell carcinoma of skin 08/09/2015   KA-Left post scalp ((MOHS), in situ- mid chest (Txpbx +exc), in situ-Left upper arm inferior (txpbx)   Squamous cell carcinoma of skin 10/13/2015   Left upper arm-clear   Squamous cell  carcinoma of skin 03/09/2016   mod diff-mid chest (txpbx+ exc), mod diff-Right chest (txpbx+exc), well diff-right cheek-(txpbx),well diff-Left hand-(txpbx), in situ-Left upper arm (txpbx), well diff-Right cheek -(txpbx), well diff-Right crease arm (txpbx)    Squamous cell carcinoma of skin 05/24/2016   well diff-Right nasal crease-(MOHS)   Squamous cell carcinoma of skin 08/02/2016   KA-Left chest med (txpbx)   Squamous cell carcinoma of skin 08/30/2016   in situ-Left outer zygoma (txpbx)   Squamous cell carcinoma of skin 12/06/2016   well diff-Left chest sup, Left shoudler, insitu- right post scalp   Squamous cell carcinoma of skin 02/14/2017   well diff-Left forearm (EXC),in situ-RIght ant neck   Squamous cell carcinoma of skin 06/14/2017   in situ-Right forearm (txpbx), in situ-Right chest (txpbx), well diff-left chest (txpbx), well diff-anterior neck- (txpbx)   Squamous cell carcinoma of skin 08/07/2017   well diff-Left upper shoulder (txpbx), sup and invasive-Left temple (txpbx), well diff-Right upper shin (txpbx), in situ-Right clavicle (txpbx)   Squamous cell carcinoma of skin 10/17/2017   well diff-ant. neck (MOHS), in situ-Right chest, inf (txpbx)   Squamous cell carcinoma of skin 01/21/2018   well diff- Right chest,ulnar (txpbx), well diff- right upper chest (txpbx), in situ-Right ant. crown (txpbx)   Squamous cell carcinoma of skin 08/02/2020   well diff-left lower leg-inferior (Txpbx)   Squamous cell carcinoma of skin 08/02/2020   well diff-right lower leg-mid (txpbx)   Squamous cell carcinoma of skin 08/02/2020   well diff-left chest upper   Squamous cell carcinoma of skin 08/02/2020   well diff-mid parietal scalp (MOHS)   Squamous cell carcinoma of skin 08/02/2020   well diff-right foot inner(txpbx)   Squamous cell carcinoma of skin 08/02/2020   well diff- left lower leg medial (txpbx)   Squamous cell carcinoma of skin 08/02/2020   well diff-left lower leg anterior  (txpbx)   Squamous cell carcinoma of skin 08/02/2020   well diff-left lower leg medial (txpbx)   Squamous cell carcinoma of skin 08/02/2020   well diff-right forearm-posterior (txpbx)    Past Surgical History:  Procedure Laterality Date   ABDOMINAL HERNIA REPAIR     Patient's states that she has had 8- 9 hernia surgeries   ABDOMINAL HYSTERECTOMY     BIOPSY  02/15/2021   Procedure: BIOPSY;  Surgeon: Rogene Houston, MD;  [facility];  Service: Endoscopy;;   CATARACT EXTRACTION W/PHACO Right 01/15/2020   Procedure: CATARACT EXTRACTION PHACO AND INTRAOCULAR LENS PLACEMENT ([place]);  Surgeon: Baruch Goldmann, MD;  Location: [facility];  Service: Ophthalmology;  Laterality: Right;  CDE: 7.89   CATARACT EXTRACTION W/PHACO Left 01/29/2020   Procedure: CATARACT EXTRACTION PHACO AND INTRAOCULAR LENS PLACEMENT (I[facility]) (CDE: 6.33);  Surgeon: Baruch Goldmann, MD;  Location: [facility];  Service: Ophthalmology;  Laterality: Left;   CHOLECYSTECTOMY  2007   COLON SURGERY  2008   [facility] at [facility]   COLONOSCOPY     Done at [facility]   ESOPHAGOGASTRODUODENOSCOPY N/A 08/21/2018   Procedure: ESOPHAGOGASTRODUODENOSCOPY (  EGD);  Surgeon: Rogene Houston, MD;  [facility];  Service: Endoscopy;  Laterality: N/A;   ESOPHAGOGASTRODUODENOSCOPY (EGD) WITH PROPOFOL N/A 12/16/2019   Procedure: ESOPHAGOGASTRODUODENOSCOPY (EGD) WITH PROPOFOL;  Surgeon: Rogene Houston, MD;  [facility];  Service: Endoscopy;  Laterality: N/A;   EYE SURGERY     lasix   FLEXIBLE SIGMOIDOSCOPY N/A 10/20/2015   Procedure: FLEXIBLE SIGMOIDOSCOPY;  Surgeon: Rogene Houston, MD;  [facility];  Service: Endoscopy;  Laterality: N/A;  55 - Dr Laural Golden has meeting until 1:00   FLEXIBLE SIGMOIDOSCOPY N/A 07/11/2016   Procedure: FLEXIBLE SIGMOIDOSCOPY;  Surgeon: Rogene Houston, MD;  [facility];  Service: Endoscopy;  Laterality: N/A;  [place] N/A 08/09/2017   Procedure: FLEXIBLE  SIGMOIDOSCOPY;  Surgeon: Rogene Houston, MD;  [facility];  Service: Endoscopy;  Laterality: N/A;  1:00   FLEXIBLE SIGMOIDOSCOPY N/A 08/21/2018   Procedure: FLEXIBLE SIGMOIDOSCOPY;  Surgeon: Rogene Houston, MD;  [facility];  Service: Endoscopy;  Laterality: N/A;   FLEXIBLE SIGMOIDOSCOPY N/A 12/16/2019   Procedure: FLEXIBLE SIGMOIDOSCOPY wirh Propofol;  Surgeon: Rogene Houston, MD;  [facility];  Service: Endoscopy;  Laterality: N/A;  7:30   FLEXIBLE SIGMOIDOSCOPY N/A 02/15/2021   Procedure: FLEXIBLE SIGMOIDOSCOPY WITH PROPOFOL;  Surgeon: Rogene Houston, MD;  [facility];  Service: Endoscopy;  Laterality: N/A;  am   FRACTURE SURGERY     right wrist metal plate   LIVER TRANSPLANT  11/19/2013   POLYPECTOMY  08/09/2017   Procedure: POLYPECTOMY;  Surgeon: Rogene Houston, MD;  [facility];  Service: Endoscopy;;  colon small bowel   REVERSE SHOULDER ARTHROPLASTY Left 07/17/2018   Procedure: LEFT REVERSE SHOULDER ARTHROPLASTY;  Surgeon: Justice Britain, MD;  Location: [facility];  Service: Orthopedics;  Laterality: Left;  145min   REVERSE SHOULDER ARTHROPLASTY Right 07/20/2021   Procedure: REVERSE SHOULDER ARTHROPLASTY;  Surgeon: Justice Britain, MD;  Location: [facility];  Service: Orthopedics;  Laterality: Right;   SHOULDER CLOSED REDUCTION Left 09/27/2019   Procedure: CLOSED REDUCTION SHOULDER;  Surgeon: Paralee Cancel, MD;  Location[facility];  Service: Orthopedics;  Laterality: Left;   SPLENECTOMY  2006   TOTAL SHOULDER REVISION Left 11/12/2019   Procedure: Revision Left Reverse Shoulder Arthroplasty with poly exchange SDD;  Surgeon: Justice Britain, MD;  Location[facility];  Service: Orthopedics;  Laterality: Left;  14min -SDDC   TYMPANOSTOMY TUBE PLACEMENT     UPPER GASTROINTESTINAL ENDOSCOPY     Done at [facility]    There were no vitals filed for this visit.   Subjective Assessment - 08/29/21 0814     Subjective Pt completed COVID screen  prior to arriving.  Pt declined any pain upon arrival.  Patient reported that she felt really good after her last appointment. She is not having any pain today.    Pertinent History Reverse TSA (L: 2021) (R: 07/20/21), history of skin cancer    Limitations Lifting;Writing    Patient Stated Goals make crafts, reaching overhead,    Currently in Pain? No/denies                               [facility] PT Treatment/Exercise - 08/29/21 0001       Shoulder Exercises: Pulleys   Flexion 2 minutes    ABduction 2 minutes      Shoulder Exercises:  ROM/Strengthening   Nustep L1 x 10 minutes   for right shoulder AAROM   Lat Pull 15 reps   2 sets; blue XTS   Pendulum 2 minutes   AP     Shoulder Exercises: Isometric Strengthening   Extension Other (comment)   2x10 5 second hold   ABduction Other (comment)   2x10 5 second hold   ADduction Other (comment)   2x10 5 second hold                      PT Short Term Goals - 08/25/21 0931       PT SHORT TERM GOAL #1   Title Patient will be able to demonstrate at least 60 degrees of shoulder flexion for improved function reaching.    Time 3    Period Weeks    Status On-going    Target Date 09/13/21      PT SHORT TERM GOAL #2   Title Patient will be able to demonstrate at least 60 degrees of shoulder abduction for improved function reaching for her daily activities.    Time 3    Period Weeks    Status On-going    Target Date 09/13/21               PT Long Term Goals - 08/25/21 0931       PT LONG TERM GOAL #1   Title Patient will be independent with her HEP.    Time 6    Period Weeks    Status On-going      PT LONG TERM GOAL #2   Title Patient will be able to demonstrate at least 120 degrees of active shoulder flexion for improved function reaching overhead.    Time 6    Period Weeks    Status On-going      PT LONG TERM GOAL #3   Title Patient will be able to demonstrate at least 120 degrees of  active shoulder abduction for improved function reaching.    Time 6    Period Weeks    Status On-going      PT LONG TERM GOAL #4   Status On-going      PT LONG TERM GOAL #5   Status On-going                   Plan - 08/29/21 0815     Clinical Impression Statement Patient was progressed with multiple new interventions for improved isometric shoulder strength with moderate difficulty and fatigue. She required minimal cuing with isometric abduction to prevent shoulder internal rotation to facilitate deltoid engagement. She experienced the most difficulty with isometric abduction as evidenced by her need for a prolonged rest break after this intervention. However, she did not experience any significant pain or discomfort with any of today's interventions. She reported that her shoulder felt tired upon the conclusion of treatment. She continues to require skilled physical therapy to address her remaining impairments to safely return to her prior level of function.    Personal Factors and Comorbidities Other;Time since onset of injury/illness/exacerbation    Examination-Activity Limitations Bathing;Reach Overhead;Carry;Dressing;Hygiene/Grooming;Lift    Examination-Participation Restrictions Community Activity;Shop;Yard Work    Stability/Clinical Decision Making Stable/Uncomplicated    Rehab Potential Good    PT Frequency 2x / week   3x per week on the week of 10/17   PT Duration 6 weeks    PT Treatment/Interventions Electrical Stimulation;Neuromuscular re-education;Therapeutic exercise;Therapeutic activities;Patient/family education;Manual techniques;Energy conservation;Passive range of motion;Scar  mobilization;Taping;Vasopneumatic Device    PT Next Visit Plan DOS: 07/20/21; see protocol; isometrics    PT Home Exercise Plan scapular retraction, bicep curl, and towel squeeze    Consulted and Agree with Plan of Care Patient             Patient will benefit from skilled therapeutic  intervention in order to improve the following deficits and impairments:  Decreased range of motion, Impaired UE functional use, Pain, Decreased strength  Visit Diagnosis: Stiffness of right shoulder, not elsewhere classified  Muscle weakness (generalized)     Problem List Patient Active Problem List   Diagnosis Date Noted   Elevated alkaline phosphatase level 02/02/2021   S/P reverse total shoulder arthroplasty, left 11/12/2019   AKI (acute kidney injury) ([place]) 10/05/2019   Anterior dislocation of left shoulder 09/26/2019   Hx of colonic polyps 09/04/2019   Chest pain 09/04/2019   Closed fracture of shaft of right ulna 07/08/2019   Fracture of shaft of ulna 07/03/2019   Benzodiazepine dependence ([place]) 04/13/2019   Controlled substance agreement signed 04/13/2019   Urticaria 09/08/2018   S/p reverse total shoulder arthroplasty 07/17/2018   History of colon cancer 06/12/2018   History of colonic polyps 06/12/2018   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   Gastroesophageal reflux disease 06/12/2018   Rotator cuff tear arthropathy 05/28/2018   Hypertension 04/08/2018   Chronic diarrhea 06/26/2017   Iron deficiency anemia 06/26/2017   GAD (generalized anxiety disorder) 08/28/2016   Vitamin D deficiency 08/28/2016   Insomnia 08/28/2016   Immunosuppression ([place]) 12/02/2015   Liver transplant recipient [facility]) 11/30/2015   Seizures ([place]) 09/27/2015   Hx of liver transplant ([place]) 12/08/2014   Colon cancer ([place]) 12/08/2014   Adenocarcinoma in a polyp ([place]) 09/17/2014   Portal vein thrombosis 11/19/2013   Hepatic encephalopathy 10/26/2013   Personal history of colonic polyps 12/27/2012   Hypothyroidism 03/24/2012   Ventral hernia, recurrent 03/24/2012   Hernia 09/04/2011   Other secondary pulmonary hypertension ([place]) 08/16/2011    Darlin Coco, PT 08/29/2021, 9:06 AM  [facility] [address] [place], [place], [address] Phone: [phone]    Fax:  [phone]  Name: Cassidy Barber MRN: [id number] Date of Birth: 1951-04-10

## 2021-08-29 NOTE — Patient Instructions (Signed)
Dysuria ?Dysuria is pain or discomfort during urination. The pain or discomfort may be felt in the part of the body that drains urine from the bladder (urethra) or in the surrounding tissue of the genitals. The pain may also be felt in the groin area, lower abdomen, or lower back. ?You may have to urinate frequently or have the sudden feeling that you have to urinate (urgency). Dysuria can affect anyone, but it is more common in females. Dysuria can be caused by many different things, including: ?Urinary tract infection. ?Kidney stones or bladder stones. ?Certain STIs (sexually transmitted infections), such as chlamydia. ?Dehydration. ?Inflammation of the tissues of the vagina. ?Use of certain medicines. ?Use of certain soaps or scented products that cause irritation. ?Follow these instructions at home: ?Medicines ?Take over-the-counter and prescription medicines only as told by your health care provider. ?If you were prescribed an antibiotic medicine, take it as told by your health care provider. Do not stop taking the antibiotic even if you start to feel better. ?Eating and drinking ? ?Drink enough fluid to keep your urine pale yellow. ?Avoid caffeinated beverages, tea, and alcohol. These beverages can irritate the bladder and make dysuria worse. In males, alcohol may irritate the prostate. ?General instructions ?Watch your condition for any changes. ?Urinate often. Avoid holding urine for long periods of time. ?If you are female, you should wipe from front to back after urinating or having a bowel movement. Use each piece of toilet paper only once. ?Empty your bladder after sex. ?Keep all follow-up visits. This is important. ?If you had any tests done to find the cause of dysuria, it is up to you to get your test results. Ask your health care provider, or the department that is doing the test, when your results will be ready. ?Contact a health care provider if: ?You have a fever. ?You develop pain in your back or  sides. ?You have nausea or vomiting. ?You have blood in your urine. ?You are not urinating as often as you usually do. ?Get help right away if: ?Your pain is severe and not relieved with medicines. ?You cannot eat or drink without vomiting. ?You are confused. ?You have a rapid heartbeat while resting. ?You have shaking or chills. ?You feel extremely weak. ?Summary ?Dysuria is pain or discomfort while urinating. Many different conditions can lead to dysuria. ?If you have dysuria, you may have to urinate frequently or have the sudden feeling that you have to urinate (urgency). ?Watch your condition for any changes. Keep all follow-up visits. ?Make sure that you urinate often and drink enough fluid to keep your urine pale yellow. ?This information is not intended to replace advice given to you by your health care provider. Make sure you discuss any questions you have with your health care provider. ?Document Revised: 06/10/2020 Document Reviewed: 06/10/2020 ?Elsevier Patient Education ? 2022 [employer] ? ?

## 2021-08-29 NOTE — Progress Notes (Signed)
Acute Office Visit  Subjective:    Patient ID: Cassidy Barber, female    DOB: 27-Apr-1951, 70 y.o.   MRN: [id number]  Chief Complaint  Patient presents with   Dysuria    Dysuria  This is a new problem. The current episode started yesterday (in the past few days). The problem has been gradually worsening. The quality of the pain is described as burning. The pain is mild. There has been no fever. Pertinent negatives include no chills, flank pain, nausea or vomiting. She has tried nothing for the symptoms.   Past Medical History:  Diagnosis Date   Abdominal wall hernia    Incarcerated status post surgical repair 2019 - [facility]   Anemia of chronic disease    Atypical nevus 01/21/2018   atypical neoplasm- Left scalp-ant (txpbx + MOHS), atypical neoplasm- Left scalp post- (txpbx + MOHS)   Basal cell carcinoma    Colon cancer ([place])    Colon surgery 2005 and 2012   History of pulmonary hypertension    Pre liver transplant   Hypertension    Hypothyroidism    Lynch syndrome    Osteopenia    Primary biliary cirrhosis ([place])    Status post liver transplantation - follows at [facility] (squamous cell carcinoma) of skin 02/23/2020   Right Upper Chest(moderate) (MOH's)   SCCA (squamous cell carcinoma) of skin 04/07/2020   Left Top Leg (Keratoacanthoma) treatment after biopsy   SCCA (squamous cell carcinoma) of skin 04/07/2020   Left Foot Dorsal (in situ) treatment after biopsy   SCCA (squamous cell carcinoma) of skin 03/27/2021   Right Upper Back (Keratoacanthoma) (excision) (clear)   SCCA (squamous cell carcinoma) of skin 06/13/2021   Right Shoulder - anterior (moderately differentiated) (tx p bx)   SCCA (squamous cell carcinoma) of skin 06/13/2021   Right Thigh - anterior (well differentiated) (tx p bx)   SCCA (squamous cell carcinoma) of skin 06/13/2021   Right Lower Leg - anterior (well differentiated) (tx p bx)   Squamous cell carcinoma of skin 04/22/2018   KA-Right mid chest  (txpbx), KA-left elbow crease (txpbx), insitu-Right mid chest inf. (exc)   Squamous cell carcinoma of skin 05/20/2018   well diff-Left upper shin (txpbx), well diff-Right lower forearm (txpbx), well diff-Right upper shin (txpbx)   Squamous cell carcinoma of skin 06/11/2018   Scc + margin-Right mid chest inferior    Squamous cell carcinoma of skin 06/27/2018   well diff-Left mid thigh(txpbx), well diff-Left inner thigh (txpbx),insitu-Right cheek (txpbx),well diff-right inner heel (txpbx)   Squamous cell carcinoma of skin 09/16/2018   well diff-left shoulder (txpbx), well diff-Right chin (txpbx), well diff-right chest lateral (CX35FU)   Squamous cell carcinoma of skin 10/01/2018   Right outer lower shin (Txpbx)   Squamous cell carcinoma of skin 04/03/2019   well diff-Right center chest (MOHS), in situ-Right ear   Squamous cell carcinoma of skin 08/05/2019   in situ-Left calf (txpbx), in situ-left bicep (txpbx), well diff-Left chest,inf(txpbx), in situ-Right chest inf-(txpbx)   Squamous cell carcinoma of skin 11/19/2019   KA-left top leg (txpbx), modify-Riight forehead-(mohs), in situ-right hand (txpbx), in situ-Right forearm (txpbx), well diff-Right chest (txpbx), well diff-chin (txpbx)   Squamous cell carcinoma of skin 01/06/2020   KA- Left top leg   Squamous cell carcinoma of skin 05/12/2013   bowens-middle of chest (CX35FU)   Squamous cell carcinoma of skin 05/18/2015   well diff-Left upper arm (CX35FU + Exc),KA-right chest(txpbx), in situ-Left shin (txpbx), well diff-Right  cheek (CX35FU), KA-Left post scalp (CX35FU)   Squamous cell carcinoma of skin 08/09/2015   KA-Left post scalp ((MOHS), in situ- mid chest (Txpbx +exc), in situ-Left upper arm inferior (txpbx)   Squamous cell carcinoma of skin 10/13/2015   Left upper arm-clear   Squamous cell carcinoma of skin 03/09/2016   mod diff-mid chest (txpbx+ exc), mod diff-Right chest (txpbx+exc), well diff-right cheek-(txpbx),well diff-Left  hand-(txpbx), in situ-Left upper arm (txpbx), well diff-Right cheek -(txpbx), well diff-Right crease arm (txpbx)    Squamous cell carcinoma of skin 05/24/2016   well diff-Right nasal crease-(MOHS)   Squamous cell carcinoma of skin 08/02/2016   KA-Left chest med (txpbx)   Squamous cell carcinoma of skin 08/30/2016   in situ-Left outer zygoma (txpbx)   Squamous cell carcinoma of skin 12/06/2016   well diff-Left chest sup, Left shoudler, insitu- right post scalp   Squamous cell carcinoma of skin 02/14/2017   well diff-Left forearm (EXC),in situ-RIght ant neck   Squamous cell carcinoma of skin 06/14/2017   in situ-Right forearm (txpbx), in situ-Right chest (txpbx), well diff-left chest (txpbx), well diff-anterior neck- (txpbx)   Squamous cell carcinoma of skin 08/07/2017   well diff-Left upper shoulder (txpbx), sup and invasive-Left temple (txpbx), well diff-Right upper shin (txpbx), in situ-Right clavicle (txpbx)   Squamous cell carcinoma of skin 10/17/2017   well diff-ant. neck (MOHS), in situ-Right chest, inf (txpbx)   Squamous cell carcinoma of skin 01/21/2018   well diff- Right chest,ulnar (txpbx), well diff- right upper chest (txpbx), in situ-Right ant. crown (txpbx)   Squamous cell carcinoma of skin 08/02/2020   well diff-left lower leg-inferior (Txpbx)   Squamous cell carcinoma of skin 08/02/2020   well diff-right lower leg-mid (txpbx)   Squamous cell carcinoma of skin 08/02/2020   well diff-left chest upper   Squamous cell carcinoma of skin 08/02/2020   well diff-mid parietal scalp (MOHS)   Squamous cell carcinoma of skin 08/02/2020   well diff-right foot inner(txpbx)   Squamous cell carcinoma of skin 08/02/2020   well diff- left lower leg medial (txpbx)   Squamous cell carcinoma of skin 08/02/2020   well diff-left lower leg anterior (txpbx)   Squamous cell carcinoma of skin 08/02/2020   well diff-left lower leg medial (txpbx)   Squamous cell carcinoma of skin 08/02/2020    well diff-right forearm-posterior (txpbx)    Past Surgical History:  Procedure Laterality Date   ABDOMINAL HERNIA REPAIR     Patient's states that she has had 8- 9 hernia surgeries   ABDOMINAL HYSTERECTOMY     BIOPSY  02/15/2021   Procedure: BIOPSY;  Surgeon: Rogene Houston, MD;  [facility];  Service: Endoscopy;;   CATARACT EXTRACTION W/PHACO Right 01/15/2020   Procedure: CATARACT EXTRACTION PHACO AND INTRAOCULAR LENS PLACEMENT ([place]);  Surgeon: Baruch Goldmann, MD;  Location: [facility];  Service: Ophthalmology;  Laterality: Right;  CDE: 7.89   CATARACT EXTRACTION W/PHACO Left 01/29/2020   Procedure: CATARACT EXTRACTION PHACO AND INTRAOCULAR LENS PLACEMENT (I[facility]) (CDE: 6.33);  Surgeon: Baruch Goldmann, MD;  Location: [facility];  Service: Ophthalmology;  Laterality: Left;   CHOLECYSTECTOMY  2007   COLON SURGERY  2008   [facility]   COLONOSCOPY     Done at [facility]   ESOPHAGOGASTRODUODENOSCOPY N/A 08/21/2018   Procedure: ESOPHAGOGASTRODUODENOSCOPY (EGD);  Surgeon: Rogene Houston, MD;  [facility];  Service: Endoscopy;  Laterality: N/A;   ESOPHAGOGASTRODUODENOSCOPY (EGD) WITH PROPOFOL N/A 12/16/2019   Procedure: ESOPHAGOGASTRODUODENOSCOPY (EGD) WITH PROPOFOL;  Surgeon: Rogene Houston, MD;  [facility];  Service: Endoscopy;  Laterality: N/A;   EYE SURGERY     lasix   FLEXIBLE SIGMOIDOSCOPY N/A 10/20/2015   Procedure: FLEXIBLE SIGMOIDOSCOPY;  Surgeon: Rogene Houston, MD;  [facility];  Service: Endoscopy;  Laterality: N/A;  52 - Dr Laural Golden has meeting until 1:00   FLEXIBLE SIGMOIDOSCOPY N/A 07/11/2016   Procedure: FLEXIBLE SIGMOIDOSCOPY;  Surgeon: Rogene Houston, MD;  [facility];  Service: Endoscopy;  Laterality: N/A;  [place] N/A 08/09/2017   Procedure: FLEXIBLE SIGMOIDOSCOPY;  Surgeon: Rogene Houston, MD;  [facility];  Service: Endoscopy;  Laterality: N/A;  1:00   FLEXIBLE SIGMOIDOSCOPY N/A  08/21/2018   Procedure: FLEXIBLE SIGMOIDOSCOPY;  Surgeon: Rogene Houston, MD;  [facility];  Service: Endoscopy;  Laterality: N/A;   FLEXIBLE SIGMOIDOSCOPY N/A 12/16/2019   Procedure: FLEXIBLE SIGMOIDOSCOPY wirh Propofol;  Surgeon: Rogene Houston, MD;  [facility];  Service: Endoscopy;  Laterality: N/A;  7:30   FLEXIBLE SIGMOIDOSCOPY N/A 02/15/2021   Procedure: FLEXIBLE SIGMOIDOSCOPY WITH PROPOFOL;  Surgeon: Rogene Houston, MD;  [facility];  Service: Endoscopy;  Laterality: N/A;  am   FRACTURE SURGERY     right wrist metal plate   LIVER TRANSPLANT  11/19/2013   POLYPECTOMY  08/09/2017   Procedure: POLYPECTOMY;  Surgeon: Rogene Houston, MD;  [facility];  Service: Endoscopy;;  colon small bowel   REVERSE SHOULDER ARTHROPLASTY Left 07/17/2018   Procedure: LEFT REVERSE SHOULDER ARTHROPLASTY;  Surgeon: Justice Britain, MD;  Location: [place];  Service: Orthopedics;  Laterality: Left;  176min   REVERSE SHOULDER ARTHROPLASTY Right 07/20/2021   Procedure: REVERSE SHOULDER ARTHROPLASTY;  Surgeon: Justice Britain, MD;  [facility];  Service: Orthopedics;  Laterality: Right;   SHOULDER CLOSED REDUCTION Left 09/27/2019   Procedure: CLOSED REDUCTION SHOULDER;  Surgeon: Paralee Cancel, MD;  Location[facility];  Service: Orthopedics;  Laterality: Left;   SPLENECTOMY  2006   TOTAL SHOULDER REVISION Left 11/12/2019   Procedure: Revision Left Reverse Shoulder Arthroplasty with poly exchange SDD;  Surgeon: Justice Britain, MD;  Location[facility];  Service: Orthopedics;  Laterality: Left;  171min -SDDC   TYMPANOSTOMY TUBE PLACEMENT     UPPER GASTROINTESTINAL ENDOSCOPY     Done at [facility]    Family History  Problem Relation Age of Onset   Prostate cancer Father    Colon cancer Father    Colon cancer Sister    Lung cancer Sister    Healthy Son    Alcohol abuse Brother    Allergic rhinitis Neg Hx    Asthma Neg Hx    Eczema Neg Hx    Urticaria Neg Hx      Social History   Socioeconomic History   Marital status: Married    Spouse name: Johnny   Number of children: 1   Years of education: 12   Highest education level: High school graduate  Occupational History   Occupation: Disability    Employer: [employer]    Comment: Multimedia programmer  Tobacco Use   Smoking status: Never   Smokeless tobacco: Never  Vaping Use   Vaping Use: Never used  Substance and Sexual Activity   Alcohol use: No    Alcohol/week: 0.0 standard drinks   Drug use: No   Sexual activity: Yes    Birth control/protection: None  Other Topics Concern  Not on file  Social History Narrative   Patient lives in a two story home with her husband. She has an adult son. She is retired from being an Web designer for 30 years.    Social Determinants of Health   Financial Resource Strain: Low Risk    Difficulty of Paying Living Expenses: Not hard at all  Food Insecurity: No Food Insecurity   Worried About Charity fundraiser in the Last Year: Never true   [place] in the Last Year: Never true  Transportation Needs: No Transportation Needs   Lack of Transportation (Medical): No   Lack of Transportation (Non-Medical): No  Physical Activity: Insufficiently Active   Days of Exercise per Week: 7 days   Minutes of Exercise per Session: 20 min  Stress: No Stress Concern Present   Feeling of Stress : Not at all  Social Connections: Socially Integrated   Frequency of Communication with Friends and Family: More than three times a week   Frequency of Social Gatherings with Friends and Family: More than three times a week   Attends Religious Services: More than 4 times per year   Active Member of [employer] or Organizations: Yes   Attends Music therapist: More than 4 times per year   Marital Status: Married  Human resources officer Violence: Not At Risk   Fear of Current or Ex-Partner: No   Emotionally Abused: No   Physically Abused: No    Sexually Abused: No    Outpatient Medications Prior to Visit  Medication Sig Dispense Refill   acetaminophen (TYLENOL) 500 MG tablet Take 1,000 mg by mouth every 6 (six) hours as needed (for pain.).      alendronate (FOSAMAX) 70 MG tablet TAKE 1 TABLET WEEKLY (TAKE WITH 8OZ OF WATER 30 MINUTES BEFORE BREAKFAST) (Patient taking differently: Take 70 mg by mouth every Thursday. (TAKE WITH 8OZ OF WATER 30 MINUTES BEFORE BREAKFAST)) 12 tablet 2   ALPRAZolam (XANAX) 0.5 MG tablet Take 1 tablet (0.5 mg total) by mouth at bedtime. 90 tablet 1   aspirin EC 81 MG tablet Take 81 mg by mouth daily.     Biotin 1000 MCG tablet Take 1,000 mcg by mouth 3 (three) times daily.     Calcium Citrate-Vitamin D (CALCIUM CITRATE + D3 PO) Take 600 mg by mouth 2 (two) times daily.     cetirizine (ZYRTEC) 10 MG tablet TAKE 1 TO 2 TABLETS TWICE DAILY (Patient taking differently: Take 10 mg by mouth daily as needed for allergies.) 120 tablet 5   clotrimazole-betamethasone (LOTRISONE) cream Apply 1 application topically 2 (two) times daily. (Patient taking differently: Apply 1 application topically daily as needed (Rash).) 30 g 0   Cyanocobalamin (VITAMIN B-12) 5000 MCG TBDP Take 5,000 mcg by mouth 2 (two) times a week. Every Monday and Wednesday.     cyclobenzaprine (FLEXERIL) 10 MG tablet Take 1 tablet (10 mg total) by mouth 3 (three) times daily as needed for muscle spasms. 30 tablet 1   famotidine (PEPCID) 20 MG tablet TAKE ONE (1) TABLET BY MOUTH TWO (2) TIMES DAILY 60 tablet 5   fluorouracil (EFUDEX) 5 % cream Apply 1 application topically daily as needed (cancer spots).   0   gabapentin (NEURONTIN) 100 MG capsule Take 1 capsule (100 mg total) by mouth daily. 90 capsule 2   levothyroxine (SYNTHROID) 112 MCG tablet Take 1 tablet (112 mcg total) by mouth daily. 90 tablet 4   loperamide (IMODIUM) 2 MG capsule Take  2 capsules (4 mg total) by mouth 3 (three) times daily as needed for diarrhea or loose stools. 200 capsule 0    losartan (COZAAR) 25 MG tablet Take 1 tablet (25 mg total) by mouth daily. 90 tablet 4   metoprolol succinate (TOPROL XL) 25 MG 24 hr tablet Take 1 tablet (25 mg total) by mouth daily. 90 tablet 4   mupirocin ointment (BACTROBAN) 2 % Apply 1 application topically daily as needed (Cancer spots).     NIACINAMIDE-ZINC-FOLIC ACID PO Take 2 tablets by mouth 2 (two) times daily.     pantoprazole (PROTONIX) 40 MG tablet Take 1 tablet (40 mg total) by mouth 2 (two) times daily. 180 tablet 2   SSD 1 % cream Apply 1 application topically daily as needed (Radiation).     ursodiol (ACTIGALL) 250 MG tablet Take 250 mg by mouth 2 (two) times daily.      Vitamin D, Ergocalciferol, (DRISDOL) 1.25 MG (50000 UNIT) CAPS capsule Take 1 capsule (50,000 Units total) by mouth once a week. 12 capsule 3   vitamin E 180 MG (400 UNITS) capsule Take 400 Units by mouth daily.     ZORTRESS 0.5 MG TABS Take 4 tablets (2 mg total) by mouth 2 (two) times daily. 180 tablet 2   cephALEXin (KEFLEX) 500 MG capsule Take 1 capsule (500 mg total) by mouth 2 (two) times daily. 14 capsule 0   HYDROmorphone (DILAUDID) 2 MG tablet Take 1 tablet (2 mg total) by mouth every 4 (four) hours as needed. 30 tablet 0   ondansetron (ZOFRAN) 4 MG tablet Take 1 tablet (4 mg total) by mouth every 8 (eight) hours as needed for nausea or vomiting. 10 tablet 0   No facility-administered medications prior to visit.    Allergies  Allergen Reactions   Ciprofloxacin Itching   Codeine Nausea Only    Review of Systems  Constitutional:  Negative for chills.  HENT: Negative.    Gastrointestinal:  Negative for abdominal distention, abdominal pain, nausea and vomiting.  Genitourinary:  Positive for dysuria. Negative for flank pain.  Skin:  Negative for rash.  All other systems reviewed and are negative.     Objective:    Physical Exam Vitals and nursing note reviewed.  Constitutional:      Appearance: Normal appearance.  HENT:     Head:  Normocephalic.     Nose: Nose normal.     Mouth/Throat:     Mouth: Mucous membranes are moist.     Pharynx: Oropharynx is clear.  Eyes:     Conjunctiva/sclera: Conjunctivae normal.  Cardiovascular:     Rate and Rhythm: Normal rate and regular rhythm.     Pulses: Normal pulses.     Heart sounds: Normal heart sounds.  Pulmonary:     Effort: Pulmonary effort is normal.     Breath sounds: Normal breath sounds.  Abdominal:     General: Bowel sounds are normal.     Tenderness: There is no abdominal tenderness. There is no right CVA tenderness or left CVA tenderness.  Skin:    General: Skin is warm.     Findings: No rash.  Neurological:     Mental Status: She is alert and oriented to person, place, and time.  Psychiatric:        Behavior: Behavior normal.    BP 128/74   Pulse 80   Temp 97.8 F (36.6 C) (Temporal)   Ht 5\' 1"  (1.549 m)   Wt 144 lb (65.3 kg)  BMI 27.21 kg/m  Wt Readings from Last 3 Encounters:  08/29/21 144 lb (65.3 kg)  07/20/21 140 lb 9.6 oz (63.8 kg)  07/06/21 140 lb 9.6 oz (63.8 kg)    Health Maintenance Due  Topic Date Due   COVID-19 Vaccine (4 - Booster for Moderna series) 10/05/2020    There are no preventive care reminders to display for this patient.   Lab Results  Component Value Date   TSH 1.580 01/13/2021   Lab Results  Component Value Date   WBC 7.6 07/03/2021   HGB 10.8 (L) 07/03/2021   HCT 35.5 (L) 07/03/2021   MCV 98.3 07/03/2021   PLT 341 07/03/2021   Lab Results  Component Value Date   NA 140 07/03/2021   K 3.9 07/03/2021   CO2 28 07/03/2021   GLUCOSE 76 07/03/2021   BUN 17 07/03/2021   CREATININE 0.82 07/03/2021   BILITOT 0.4 05/04/2021   ALKPHOS 111 05/04/2021   AST 20 05/04/2021   ALT 13 05/04/2021   PROT 6.7 05/04/2021   ALBUMIN 3.1 (L) 05/04/2021   CALCIUM 8.7 (L) 07/03/2021   ANIONGAP 6 07/03/2021   Lab Results  Component Value Date   CHOL 156 07/05/2014   Lab Results  Component Value Date   HDL 60  07/05/2014   Lab Results  Component Value Date   LDLCALC 73 07/05/2014   Lab Results  Component Value Date   TRIG 116 07/05/2014   Lab Results  Component Value Date   CHOLHDL 2.6 07/05/2014   No results found for: HGBA1C     Assessment & Plan:   Problem List Items Addressed This Visit       Other   Dysuria - Primary    Symptoms not well controlled, completed urinalysis- positive for Leu and moderate bacteria, urine cultures completed results pending.  Keflex 500 mg tablet by mouth daily, increase hydration, tylenol/ibuprohhen for pain or fever. Education provided printed hand out given.  RX sent to pharmacy  Follow up with unresolved symptoms      Relevant Medications   cephALEXin (KEFLEX) 500 MG capsule   Other Relevant Orders   Urinalysis, Routine w reflex microscopic   CULTURE, URINE COMPREHENSIVE     Meds ordered this encounter  Medications   cephALEXin (KEFLEX) 500 MG capsule    Sig: Take 1 capsule (500 mg total) by mouth 2 (two) times daily.    Dispense:  14 capsule    Refill:  0    Order Specific Question:   Supervising Provider    Answer:   Claretta Fraise [[id number]]     Ivy Lynn, NP

## 2021-08-29 NOTE — Assessment & Plan Note (Signed)
Symptoms not well controlled, completed urinalysis- positive for Leu and moderate bacteria, urine cultures completed results pending.  Keflex 500 mg tablet by mouth daily, increase hydration, tylenol/ibuprohhen for pain or fever. Education provided printed hand out given.  RX sent to pharmacy  Follow up with unresolved symptoms

## 2021-08-31 ENCOUNTER — Encounter: Payer: Self-pay | Admitting: Physical Therapy

## 2021-08-31 ENCOUNTER — Ambulatory Visit: Payer: Medicare Other | Admitting: Physical Therapy

## 2021-08-31 ENCOUNTER — Encounter: Payer: Medicare Other | Admitting: Physician Assistant

## 2021-08-31 ENCOUNTER — Other Ambulatory Visit: Payer: Self-pay

## 2021-08-31 DIAGNOSIS — M25611 Stiffness of right shoulder, not elsewhere classified: Secondary | ICD-10-CM

## 2021-08-31 DIAGNOSIS — M6281 Muscle weakness (generalized): Secondary | ICD-10-CM | POA: Diagnosis not present

## 2021-08-31 NOTE — Therapy (Signed)
[facility] [place], [place], [address] Phone: [phone]   Fax:  [phone]  Physical Therapy Treatment  Patient Details  Name: Cassidy Barber MRN: [id number] Date of Birth: 14-Sep-1951 Referring Provider (PT): Supple   Encounter Date: 08/31/2021   PT End of Session - 08/31/21 0824     Visit Number 4    Number of Visits 13    Date for PT Re-Evaluation 10/04/21    PT Start Time 0819    PT Stop Time 0903    PT Time Calculation (min) 44 min    Activity Tolerance Patient tolerated treatment well    Behavior During Therapy [facility] for tasks assessed/performed             Past Medical History:  Diagnosis Date   Abdominal wall hernia    Incarcerated status post surgical repair 2019 - [facility]   Anemia of chronic disease    Atypical nevus 01/21/2018   atypical neoplasm- Left scalp-ant (txpbx + MOHS), atypical neoplasm- Left scalp post- (txpbx + MOHS)   Basal cell carcinoma    Colon cancer ([place])    Colon surgery 2005 and 2012   History of pulmonary hypertension    Pre liver transplant   Hypertension    Hypothyroidism    Lynch syndrome    Osteopenia    Primary biliary cirrhosis ([place])    Status post liver transplantation - follows at [place] (squamous cell carcinoma) of skin 02/23/2020   Right Upper Chest(moderate) (MOH's)   SCCA (squamous cell carcinoma) of skin 04/07/2020   Left Top Leg (Keratoacanthoma) treatment after biopsy   SCCA (squamous cell carcinoma) of skin 04/07/2020   Left Foot Dorsal (in situ) treatment after biopsy   SCCA (squamous cell carcinoma) of skin 03/27/2021   Right Upper Back (Keratoacanthoma) (excision) (clear)   SCCA (squamous cell carcinoma) of skin 06/13/2021   Right Shoulder - anterior (moderately differentiated) (tx p bx)   SCCA (squamous cell carcinoma) of skin 06/13/2021   Right Thigh - anterior (well differentiated) (tx p bx)   SCCA (squamous cell carcinoma) of skin 06/13/2021    Right Lower Leg - anterior (well differentiated) (tx p bx)   Squamous cell carcinoma of skin 04/22/2018   KA-Right mid chest (txpbx), KA-left elbow crease (txpbx), insitu-Right mid chest inf. (exc)   Squamous cell carcinoma of skin 05/20/2018   well diff-Left upper shin (txpbx), well diff-Right lower forearm (txpbx), well diff-Right upper shin (txpbx)   Squamous cell carcinoma of skin 06/11/2018   Scc + margin-Right mid chest inferior    Squamous cell carcinoma of skin 06/27/2018   well diff-Left mid thigh(txpbx), well diff-Left inner thigh (txpbx),insitu-Right cheek (txpbx),well diff-right inner heel (txpbx)   Squamous cell carcinoma of skin 09/16/2018   well diff-left shoulder (txpbx), well diff-Right chin (txpbx), well diff-right chest lateral (CX35FU)   Squamous cell carcinoma of skin 10/01/2018   Right outer lower shin (Txpbx)   Squamous cell carcinoma of skin 04/03/2019   well diff-Right center chest (MOHS), in situ-Right ear   Squamous cell carcinoma of skin 08/05/2019   in situ-Left calf (txpbx), in situ-left bicep (txpbx), well diff-Left chest,inf(txpbx), in situ-Right chest inf-(txpbx)   Squamous cell carcinoma of skin 11/19/2019   KA-left top leg (txpbx), modify-Riight forehead-(mohs), in situ-right hand (txpbx), in situ-Right forearm (txpbx), well diff-Right chest (txpbx), well diff-chin (txpbx)   Squamous cell carcinoma of skin 01/06/2020   KA- Left top leg   Squamous cell carcinoma of  skin 05/12/2013   bowens-middle of chest (CX35FU)   Squamous cell carcinoma of skin 05/18/2015   well diff-Left upper arm (CX35FU + Exc),KA-right chest(txpbx), in situ-Left shin (txpbx), well diff-Right cheek (CX35FU), KA-Left post scalp (CX35FU)   Squamous cell carcinoma of skin 08/09/2015   KA-Left post scalp ((MOHS), in situ- mid chest (Txpbx +exc), in situ-Left upper arm inferior (txpbx)   Squamous cell carcinoma of skin 10/13/2015   Left upper arm-clear   Squamous cell carcinoma of skin  03/09/2016   mod diff-mid chest (txpbx+ exc), mod diff-Right chest (txpbx+exc), well diff-right cheek-(txpbx),well diff-Left hand-(txpbx), in situ-Left upper arm (txpbx), well diff-Right cheek -(txpbx), well diff-Right crease arm (txpbx)    Squamous cell carcinoma of skin 05/24/2016   well diff-Right nasal crease-(MOHS)   Squamous cell carcinoma of skin 08/02/2016   KA-Left chest med (txpbx)   Squamous cell carcinoma of skin 08/30/2016   in situ-Left outer zygoma (txpbx)   Squamous cell carcinoma of skin 12/06/2016   well diff-Left chest sup, Left shoudler, insitu- right post scalp   Squamous cell carcinoma of skin 02/14/2017   well diff-Left forearm (EXC),in situ-RIght ant neck   Squamous cell carcinoma of skin 06/14/2017   in situ-Right forearm (txpbx), in situ-Right chest (txpbx), well diff-left chest (txpbx), well diff-anterior neck- (txpbx)   Squamous cell carcinoma of skin 08/07/2017   well diff-Left upper shoulder (txpbx), sup and invasive-Left temple (txpbx), well diff-Right upper shin (txpbx), in situ-Right clavicle (txpbx)   Squamous cell carcinoma of skin 10/17/2017   well diff-ant. neck (MOHS), in situ-Right chest, inf (txpbx)   Squamous cell carcinoma of skin 01/21/2018   well diff- Right chest,ulnar (txpbx), well diff- right upper chest (txpbx), in situ-Right ant. crown (txpbx)   Squamous cell carcinoma of skin 08/02/2020   well diff-left lower leg-inferior (Txpbx)   Squamous cell carcinoma of skin 08/02/2020   well diff-right lower leg-mid (txpbx)   Squamous cell carcinoma of skin 08/02/2020   well diff-left chest upper   Squamous cell carcinoma of skin 08/02/2020   well diff-mid parietal scalp (MOHS)   Squamous cell carcinoma of skin 08/02/2020   well diff-right foot inner(txpbx)   Squamous cell carcinoma of skin 08/02/2020   well diff- left lower leg medial (txpbx)   Squamous cell carcinoma of skin 08/02/2020   well diff-left lower leg anterior (txpbx)   Squamous  cell carcinoma of skin 08/02/2020   well diff-left lower leg medial (txpbx)   Squamous cell carcinoma of skin 08/02/2020   well diff-right forearm-posterior (txpbx)    Past Surgical History:  Procedure Laterality Date   ABDOMINAL HERNIA REPAIR     Patient's states that she has had 8- 9 hernia surgeries   ABDOMINAL HYSTERECTOMY     BIOPSY  02/15/2021   Procedure: BIOPSY;  Surgeon: Rogene Houston, MD;  [facility];  Service: Endoscopy;;   CATARACT EXTRACTION W/PHACO Right 01/15/2020   Procedure: CATARACT EXTRACTION PHACO AND INTRAOCULAR LENS PLACEMENT ([place]);  Surgeon: Baruch Goldmann, MD;  Location: [facility];  Service: Ophthalmology;  Laterality: Right;  CDE: 7.89   CATARACT EXTRACTION W/PHACO Left 01/29/2020   Procedure: CATARACT EXTRACTION PHACO AND INTRAOCULAR LENS PLACEMENT (IOC) (CDE: 6.33);  Surgeon: Baruch Goldmann, MD;  Location: [facility];  Service: Ophthalmology;  Laterality: Left;   CHOLECYSTECTOMY  2007   COLON SURGERY  2008   [facility]   COLONOSCOPY     Done at [facility]   ESOPHAGOGASTRODUODENOSCOPY N/A 08/21/2018   Procedure: ESOPHAGOGASTRODUODENOSCOPY (EGD);  Surgeon: Rogene Houston, MD;  [facility];  Service: Endoscopy;  Laterality: N/A;   ESOPHAGOGASTRODUODENOSCOPY (EGD) WITH PROPOFOL N/A 12/16/2019   Procedure: ESOPHAGOGASTRODUODENOSCOPY (EGD) WITH PROPOFOL;  Surgeon: Rogene Houston, MD;  [facility];  Service: Endoscopy;  Laterality: N/A;   EYE SURGERY     lasix   FLEXIBLE SIGMOIDOSCOPY N/A 10/20/2015   Procedure: FLEXIBLE SIGMOIDOSCOPY;  Surgeon: Rogene Houston, MD;  [facility];  Service: Endoscopy;  Laterality: N/A;  81 - Dr Laural Golden has meeting until 1:00   FLEXIBLE SIGMOIDOSCOPY N/A 07/11/2016   Procedure: FLEXIBLE SIGMOIDOSCOPY;  Surgeon: Rogene Houston, MD;  [facility];  Service: Endoscopy;  Laterality: N/A;  [place] N/A 08/09/2017   Procedure: FLEXIBLE SIGMOIDOSCOPY;  Surgeon:  Rogene Houston, MD;  [facility];  Service: Endoscopy;  Laterality: N/A;  1:00   FLEXIBLE SIGMOIDOSCOPY N/A 08/21/2018   Procedure: FLEXIBLE SIGMOIDOSCOPY;  Surgeon: Rogene Houston, MD;  [facility];  Service: Endoscopy;  Laterality: N/A;   FLEXIBLE SIGMOIDOSCOPY N/A 12/16/2019   Procedure: FLEXIBLE SIGMOIDOSCOPY wirh Propofol;  Surgeon: Rogene Houston, MD;  [facility];  Service: Endoscopy;  Laterality: N/A;  7:30   FLEXIBLE SIGMOIDOSCOPY N/A 02/15/2021   Procedure: FLEXIBLE SIGMOIDOSCOPY WITH PROPOFOL;  Surgeon: Rogene Houston, MD;  [facility];  Service: Endoscopy;  Laterality: N/A;  am   FRACTURE SURGERY     right wrist metal plate   LIVER TRANSPLANT  11/19/2013   POLYPECTOMY  08/09/2017   Procedure: POLYPECTOMY;  Surgeon: Rogene Houston, MD;  [facility];  Service: Endoscopy;;  colon small bowel   REVERSE SHOULDER ARTHROPLASTY Left 07/17/2018   Procedure: LEFT REVERSE SHOULDER ARTHROPLASTY;  Surgeon: Justice Britain, MD;  Location: [place];  Service: Orthopedics;  Laterality: Left;  148min   REVERSE SHOULDER ARTHROPLASTY Right 07/20/2021   Procedure: REVERSE SHOULDER ARTHROPLASTY;  Surgeon: Justice Britain, MD;  [facility];  Service: Orthopedics;  Laterality: Right;   SHOULDER CLOSED REDUCTION Left 09/27/2019   Procedure: CLOSED REDUCTION SHOULDER;  Surgeon: Paralee Cancel, MD;  [facility];  Service: Orthopedics;  Laterality: Left;   SPLENECTOMY  2006   TOTAL SHOULDER REVISION Left 11/12/2019   Procedure: Revision Left Reverse Shoulder Arthroplasty with poly exchange SDD;  Surgeon: Justice Britain, MD;  [facility];  Service: Orthopedics;  Laterality: Left;  184min -SDDC   TYMPANOSTOMY TUBE PLACEMENT     UPPER GASTROINTESTINAL ENDOSCOPY     Done at [facility]    There were no vitals filed for this visit.   Subjective Assessment - 08/31/21 0820     Subjective Patient reports that her shoulder was very sore after  the last treatment.    Pertinent History Reverse TSA (L: 2021) (R: 07/20/21), history of skin cancer    Limitations Lifting;Writing    Patient Stated Goals make crafts, reaching overhead,    Currently in Pain? Yes    Pain Score 3     Pain Location Shoulder    Pain Orientation Right    Pain Descriptors / Indicators Sore    Pain Type Surgical pain    Pain Onset More than a month ago    Pain Frequency Intermittent                [facility] PT Assessment - 08/31/21 0001       Assessment   Medical Diagnosis Right Reverse TSA  Referring Provider (PT) Supple    Onset Date/Surgical Date 07/20/21    Hand Dominance Right    Prior Therapy Yes      Precautions   Precautions Shoulder    Type of Shoulder Precautions Reverse TSA    Precaution Comments See Protocol                           [facility] Adult PT Treatment/Exercise - 08/31/21 0001       Shoulder Exercises: Supine   Protraction AAROM;Both;20 reps    External Rotation AAROM;Right;20 reps    Flexion AAROM;Both;20 reps      Shoulder Exercises: Standing   Other Standing Exercises Wall wash in ER x20 reps      Shoulder Exercises: Pulleys   Flexion 5 minutes      Shoulder Exercises: ROM/Strengthening   Nustep L5 x11 min      Shoulder Exercises: Isometric Strengthening   Flexion 5X5"    External Rotation 5X5"    Internal Rotation 5X5"    ABduction 5X5"      Manual Therapy   Manual Therapy Passive ROM    Passive ROM PROM of R shoulder into flex, ER with gentle holds at end range                       PT Short Term Goals - 08/25/21 0931       PT SHORT TERM GOAL #1   Title Patient will be able to demonstrate at least 60 degrees of shoulder flexion for improved function reaching.    Time 3    Period Weeks    Status On-going    Target Date 09/13/21      PT SHORT TERM GOAL #2   Title Patient will be able to demonstrate at least 60 degrees of shoulder abduction for improved function  reaching for her daily activities.    Time 3    Period Weeks    Status On-going    Target Date 09/13/21               PT Long Term Goals - 08/25/21 0931       PT LONG TERM GOAL #1   Title Patient will be independent with her HEP.    Time 6    Period Weeks    Status On-going      PT LONG TERM GOAL #2   Title Patient will be able to demonstrate at least 120 degrees of active shoulder flexion for improved function reaching overhead.    Time 6    Period Weeks    Status On-going      PT LONG TERM GOAL #3   Title Patient will be able to demonstrate at least 120 degrees of active shoulder abduction for improved function reaching.    Time 6    Period Weeks    Status On-going      PT LONG TERM GOAL #4   Status On-going      PT LONG TERM GOAL #5   Status On-going                   Plan - 08/31/21 0905     Clinical Impression Statement Patient presented in clinic with min-mod soreness of R shoulder. Patient limited with any AROM of R shoulder due to weakness and pain. Patient required intermittant multimodal cueing for isometric training to correct technique and position to allow for appropriate  deltoid involvement. Patient reporting more discomfort with AAROM ER and PROM ER as well. Increased soreness and fatigue reported by patient following end of treatment.    Personal Factors and Comorbidities Other;Time since onset of injury/illness/exacerbation    Examination-Activity Limitations Bathing;Reach Overhead;Carry;Dressing;Hygiene/Grooming;Lift    Examination-Participation Restrictions Community Activity;Shop;Yard Work    Stability/Clinical Decision Making Stable/Uncomplicated    Rehab Potential Good    PT Frequency 2x / week    PT Duration 6 weeks    PT Treatment/Interventions Electrical Stimulation;Neuromuscular re-education;Therapeutic exercise;Therapeutic activities;Patient/family education;Manual techniques;Energy conservation;Passive range of motion;Scar  mobilization;Taping;Vasopneumatic Device    PT Next Visit Plan DOS: 07/20/21; see protocol; isometrics    PT Home Exercise Plan scapular retraction, bicep curl, and towel squeeze    Consulted and Agree with Plan of Care Patient             Patient will benefit from skilled therapeutic intervention in order to improve the following deficits and impairments:  Decreased range of motion, Impaired UE functional use, Pain, Decreased strength  Visit Diagnosis: Stiffness of right shoulder, not elsewhere classified  Muscle weakness (generalized)     Problem List Patient Active Problem List   Diagnosis Date Noted   Dysuria 08/29/2021   Elevated alkaline phosphatase level 02/02/2021   S/P reverse total shoulder arthroplasty, left 11/12/2019   AKI (acute kidney injury) ([place]) 10/05/2019   Anterior dislocation of left shoulder 09/26/2019   Hx of colonic polyps 09/04/2019   Chest pain 09/04/2019   Closed fracture of shaft of right ulna 07/08/2019   Fracture of shaft of ulna 07/03/2019   Benzodiazepine dependence ([place]) 04/13/2019   Controlled substance agreement signed 04/13/2019   Urticaria 09/08/2018   S/p reverse total shoulder arthroplasty 07/17/2018   History of colon cancer 06/12/2018   History of colonic polyps 06/12/2018   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   Gastroesophageal reflux disease 06/12/2018   Rotator cuff tear arthropathy 05/28/2018   Hypertension 04/08/2018   Chronic diarrhea 06/26/2017   Iron deficiency anemia 06/26/2017   GAD (generalized anxiety disorder) 08/28/2016   Vitamin D deficiency 08/28/2016   Insomnia 08/28/2016   Immunosuppression ([place]) 12/02/2015   Liver transplant recipient [facility]) 11/30/2015   Seizures ([place]) 09/27/2015   Hx of liver transplant ([place]) 12/08/2014   Colon cancer ([place]) 12/08/2014   Adenocarcinoma in a polyp ([place]) 09/17/2014   Portal vein thrombosis 11/19/2013   Hepatic encephalopathy 10/26/2013   Personal history of colonic polyps 12/27/2012    Hypothyroidism 03/24/2012   Ventral hernia, recurrent 03/24/2012   Hernia 09/04/2011   Other secondary pulmonary hypertension ([place]) 08/16/2011    Standley Brooking, PTA 08/31/2021, 9:10 AM  [facility] [place], [place], [address] Phone: [phone]   Fax:  [phone]  Name: Cassidy Barber MRN: [id number] Date of Birth: 26-Nov-1950

## 2021-09-05 ENCOUNTER — Other Ambulatory Visit: Payer: Self-pay | Admitting: Nurse Practitioner

## 2021-09-05 DIAGNOSIS — R8279 Other abnormal findings on microbiological examination of urine: Secondary | ICD-10-CM | POA: Insufficient documentation

## 2021-09-05 MED ORDER — AMOXICILLIN-POT CLAVULANATE 875-125 MG PO TABS: 1.0000 | ORAL_TABLET | Freq: Two times a day (BID) | ORAL | 0 refills | Status: DC

## 2021-09-07 ENCOUNTER — Ambulatory Visit: Payer: Medicare Other

## 2021-09-08 ENCOUNTER — Other Ambulatory Visit: Payer: Self-pay

## 2021-09-08 ENCOUNTER — Ambulatory Visit: Payer: Medicare Other

## 2021-09-08 DIAGNOSIS — M25611 Stiffness of right shoulder, not elsewhere classified: Secondary | ICD-10-CM

## 2021-09-08 DIAGNOSIS — M6281 Muscle weakness (generalized): Secondary | ICD-10-CM | POA: Diagnosis not present

## 2021-09-08 LAB — CULTURE, URINE COMPREHENSIVE

## 2021-09-08 NOTE — Therapy (Signed)
[facility] [place], [place], [address] Phone: [phone]   Fax:  [phone]  Physical Therapy Treatment  Patient Details  Name: Cassidy Barber MRN: [id number] Date of Birth: 27-Aug-1951 Referring Provider (PT): Supple   Encounter Date: 09/08/2021   PT End of Session - 09/08/21 0815     Visit Number 5    Number of Visits 13    Date for PT Re-Evaluation 10/04/21    PT Start Time 0816    PT Stop Time 0909    PT Time Calculation (min) 53 min    Activity Tolerance Patient tolerated treatment well    Behavior During Therapy [facility] for tasks assessed/performed             Past Medical History:  Diagnosis Date   Abdominal wall hernia    Incarcerated status post surgical repair 2019 - [facility]   Anemia of chronic disease    Atypical nevus 01/21/2018   atypical neoplasm- Left scalp-ant (txpbx + MOHS), atypical neoplasm- Left scalp post- (txpbx + MOHS)   Basal cell carcinoma    Colon cancer ([place])    Colon surgery 2005 and 2012   History of pulmonary hypertension    Pre liver transplant   Hypertension    Hypothyroidism    Lynch syndrome    Osteopenia    Primary biliary cirrhosis ([place])    Status post liver transplantation - follows at [facility] (squamous cell carcinoma) of skin 02/23/2020   Right Upper Chest(moderate) (MOH's)   SCCA (squamous cell carcinoma) of skin 04/07/2020   Left Top Leg (Keratoacanthoma) treatment after biopsy   SCCA (squamous cell carcinoma) of skin 04/07/2020   Left Foot Dorsal (in situ) treatment after biopsy   SCCA (squamous cell carcinoma) of skin 03/27/2021   Right Upper Back (Keratoacanthoma) (excision) (clear)   SCCA (squamous cell carcinoma) of skin 06/13/2021   Right Shoulder - anterior (moderately differentiated) (tx p bx)   SCCA (squamous cell carcinoma) of skin 06/13/2021   Right Thigh - anterior (well differentiated) (tx p bx)   SCCA (squamous cell carcinoma) of skin 06/13/2021    Right Lower Leg - anterior (well differentiated) (tx p bx)   Squamous cell carcinoma of skin 04/22/2018   KA-Right mid chest (txpbx), KA-left elbow crease (txpbx), insitu-Right mid chest inf. (exc)   Squamous cell carcinoma of skin 05/20/2018   well diff-Left upper shin (txpbx), well diff-Right lower forearm (txpbx), well diff-Right upper shin (txpbx)   Squamous cell carcinoma of skin 06/11/2018   Scc + margin-Right mid chest inferior    Squamous cell carcinoma of skin 06/27/2018   well diff-Left mid thigh(txpbx), well diff-Left inner thigh (txpbx),insitu-Right cheek (txpbx),well diff-right inner heel (txpbx)   Squamous cell carcinoma of skin 09/16/2018   well diff-left shoulder (txpbx), well diff-Right chin (txpbx), well diff-right chest lateral (CX35FU)   Squamous cell carcinoma of skin 10/01/2018   Right outer lower shin (Txpbx)   Squamous cell carcinoma of skin 04/03/2019   well diff-Right center chest (MOHS), in situ-Right ear   Squamous cell carcinoma of skin 08/05/2019   in situ-Left calf (txpbx), in situ-left bicep (txpbx), well diff-Left chest,inf(txpbx), in situ-Right chest inf-(txpbx)   Squamous cell carcinoma of skin 11/19/2019   KA-left top leg (txpbx), modify-Riight forehead-(mohs), in situ-right hand (txpbx), in situ-Right forearm (txpbx), well diff-Right chest (txpbx), well diff-chin (txpbx)   Squamous cell carcinoma of skin 01/06/2020   KA- Left top leg   Squamous cell carcinoma of  skin 05/12/2013   bowens-middle of chest (CX35FU)   Squamous cell carcinoma of skin 05/18/2015   well diff-Left upper arm (CX35FU + Exc),KA-right chest(txpbx), in situ-Left shin (txpbx), well diff-Right cheek (CX35FU), KA-Left post scalp (CX35FU)   Squamous cell carcinoma of skin 08/09/2015   KA-Left post scalp ((MOHS), in situ- mid chest (Txpbx +exc), in situ-Left upper arm inferior (txpbx)   Squamous cell carcinoma of skin 10/13/2015   Left upper arm-clear   Squamous cell carcinoma of skin  03/09/2016   mod diff-mid chest (txpbx+ exc), mod diff-Right chest (txpbx+exc), well diff-right cheek-(txpbx),well diff-Left hand-(txpbx), in situ-Left upper arm (txpbx), well diff-Right cheek -(txpbx), well diff-Right crease arm (txpbx)    Squamous cell carcinoma of skin 05/24/2016   well diff-Right nasal crease-(MOHS)   Squamous cell carcinoma of skin 08/02/2016   KA-Left chest med (txpbx)   Squamous cell carcinoma of skin 08/30/2016   in situ-Left outer zygoma (txpbx)   Squamous cell carcinoma of skin 12/06/2016   well diff-Left chest sup, Left shoudler, insitu- right post scalp   Squamous cell carcinoma of skin 02/14/2017   well diff-Left forearm (EXC),in situ-RIght ant neck   Squamous cell carcinoma of skin 06/14/2017   in situ-Right forearm (txpbx), in situ-Right chest (txpbx), well diff-left chest (txpbx), well diff-anterior neck- (txpbx)   Squamous cell carcinoma of skin 08/07/2017   well diff-Left upper shoulder (txpbx), sup and invasive-Left temple (txpbx), well diff-Right upper shin (txpbx), in situ-Right clavicle (txpbx)   Squamous cell carcinoma of skin 10/17/2017   well diff-ant. neck (MOHS), in situ-Right chest, inf (txpbx)   Squamous cell carcinoma of skin 01/21/2018   well diff- Right chest,ulnar (txpbx), well diff- right upper chest (txpbx), in situ-Right ant. crown (txpbx)   Squamous cell carcinoma of skin 08/02/2020   well diff-left lower leg-inferior (Txpbx)   Squamous cell carcinoma of skin 08/02/2020   well diff-right lower leg-mid (txpbx)   Squamous cell carcinoma of skin 08/02/2020   well diff-left chest upper   Squamous cell carcinoma of skin 08/02/2020   well diff-mid parietal scalp (MOHS)   Squamous cell carcinoma of skin 08/02/2020   well diff-right foot inner(txpbx)   Squamous cell carcinoma of skin 08/02/2020   well diff- left lower leg medial (txpbx)   Squamous cell carcinoma of skin 08/02/2020   well diff-left lower leg anterior (txpbx)   Squamous  cell carcinoma of skin 08/02/2020   well diff-left lower leg medial (txpbx)   Squamous cell carcinoma of skin 08/02/2020   well diff-right forearm-posterior (txpbx)    Past Surgical History:  Procedure Laterality Date   ABDOMINAL HERNIA REPAIR     Patient's states that she has had 8- 9 hernia surgeries   ABDOMINAL HYSTERECTOMY     BIOPSY  02/15/2021   Procedure: BIOPSY;  Surgeon: Rogene Houston, MD;  [facility];  Service: Endoscopy;;   CATARACT EXTRACTION W/PHACO Right 01/15/2020   Procedure: CATARACT EXTRACTION PHACO AND INTRAOCULAR LENS PLACEMENT ([place]);  Surgeon: Baruch Goldmann, MD;  Location: [facility];  Service: Ophthalmology;  Laterality: Right;  CDE: 7.89   CATARACT EXTRACTION W/PHACO Left 01/29/2020   Procedure: CATARACT EXTRACTION PHACO AND INTRAOCULAR LENS PLACEMENT (I[facility]) (CDE: 6.33);  Surgeon: Baruch Goldmann, MD;  Location: [facility];  Service: Ophthalmology;  Laterality: Left;   CHOLECYSTECTOMY  2007   COLON SURGERY  2008   Done at [facility]   COLONOSCOPY     Done at [facility]   ESOPHAGOGASTRODUODENOSCOPY N/A 08/21/2018   Procedure: ESOPHAGOGASTRODUODENOSCOPY (EGD);  Surgeon: Rogene Houston, MD;  [facility];  Service: Endoscopy;  Laterality: N/A;   ESOPHAGOGASTRODUODENOSCOPY (EGD) WITH PROPOFOL N/A 12/16/2019   Procedure: ESOPHAGOGASTRODUODENOSCOPY (EGD) WITH PROPOFOL;  Surgeon: Rogene Houston, MD;  [facility];  Service: Endoscopy;  Laterality: N/A;   EYE SURGERY     lasix   FLEXIBLE SIGMOIDOSCOPY N/A 10/20/2015   Procedure: FLEXIBLE SIGMOIDOSCOPY;  Surgeon: Rogene Houston, MD;  [facility];  Service: Endoscopy;  Laterality: N/A;  29 - Dr Laural Golden has meeting until 1:00   FLEXIBLE SIGMOIDOSCOPY N/A 07/11/2016   Procedure: FLEXIBLE SIGMOIDOSCOPY;  Surgeon: Rogene Houston, MD;  [facility];  Service: Endoscopy;  Laterality: N/A;  [place] N/A 08/09/2017   Procedure: FLEXIBLE SIGMOIDOSCOPY;  Surgeon:  Rogene Houston, MD;  [facility];  Service: Endoscopy;  Laterality: N/A;  1:00   FLEXIBLE SIGMOIDOSCOPY N/A 08/21/2018   Procedure: FLEXIBLE SIGMOIDOSCOPY;  Surgeon: Rogene Houston, MD;  [facility];  Service: Endoscopy;  Laterality: N/A;   FLEXIBLE SIGMOIDOSCOPY N/A 12/16/2019   Procedure: FLEXIBLE SIGMOIDOSCOPY wirh Propofol;  Surgeon: Rogene Houston, MD;  [facility];  Service: Endoscopy;  Laterality: N/A;  7:30   FLEXIBLE SIGMOIDOSCOPY N/A 02/15/2021   Procedure: FLEXIBLE SIGMOIDOSCOPY WITH PROPOFOL;  Surgeon: Rogene Houston, MD;  [facility];  Service: Endoscopy;  Laterality: N/A;  am   FRACTURE SURGERY     right wrist metal plate   LIVER TRANSPLANT  11/19/2013   POLYPECTOMY  08/09/2017   Procedure: POLYPECTOMY;  Surgeon: Rogene Houston, MD;  [facility];  Service: Endoscopy;;  colon small bowel   REVERSE SHOULDER ARTHROPLASTY Left 07/17/2018   Procedure: LEFT REVERSE SHOULDER ARTHROPLASTY;  Surgeon: Justice Britain, MD;  Location: [place];  Service: Orthopedics;  Laterality: Left;  177min   REVERSE SHOULDER ARTHROPLASTY Right 07/20/2021   Procedure: REVERSE SHOULDER ARTHROPLASTY;  Surgeon: Justice Britain, MD;  [facility];  Service: Orthopedics;  Laterality: Right;   SHOULDER CLOSED REDUCTION Left 09/27/2019   Procedure: CLOSED REDUCTION SHOULDER;  Surgeon: Paralee Cancel, MD;  Location[facility];  Service: Orthopedics;  Laterality: Left;   SPLENECTOMY  2006   TOTAL SHOULDER REVISION Left 11/12/2019   Procedure: Revision Left Reverse Shoulder Arthroplasty with poly exchange SDD;  Surgeon: Justice Britain, MD;  [facility];  Service: Orthopedics;  Laterality: Left;  179min -SDDC   TYMPANOSTOMY TUBE PLACEMENT     UPPER GASTROINTESTINAL ENDOSCOPY     Done at [facility]    There were no vitals filed for this visit.   Subjective Assessment - 09/08/21 0814     Subjective Patient reports that her shoulder was really sore and  tired yesterday after her last appointment.    Pertinent History Reverse TSA (L: 2021) (R: 07/20/21), history of skin cancer    Limitations Lifting;Writing    Patient Stated Goals make crafts, reaching overhead,    Currently in Pain? Yes    Pain Score 5     Pain Location Shoulder    Pain Orientation Right    Pain Descriptors / Indicators Sore    Pain Type Surgical pain    Pain Onset More than a month ago                               [facility] Adult PT Treatment/Exercise - 09/08/21 0001  Elbow Exercises   Elbow Flexion AROM;Right;20 reps;Standing      Shoulder Exercises: Seated   Retraction AROM;Both;Other (comment)   30 reps     Shoulder Exercises: ROM/Strengthening   Nustep L5 x 12 minutes    [place] Seated; 3 minutes      Modalities   Modalities Vasopneumatic      Vasopneumatic   Number Minutes Vasopneumatic  12 minutes    Vasopnuematic Location  Shoulder    Vasopneumatic Pressure Low    Vasopneumatic Temperature  34      Manual Therapy   Manual Therapy Passive ROM    Passive ROM flexion, abduction, and scaption, to tolerance                       PT Short Term Goals - 08/25/21 0931       PT SHORT TERM GOAL #1   Title Patient will be able to demonstrate at least 60 degrees of shoulder flexion for improved function reaching.    Time 3    Period Weeks    Status On-going    Target Date 09/13/21      PT SHORT TERM GOAL #2   Title Patient will be able to demonstrate at least 60 degrees of shoulder abduction for improved function reaching for her daily activities.    Time 3    Period Weeks    Status On-going    Target Date 09/13/21               PT Long Term Goals - 08/25/21 0931       PT LONG TERM GOAL #1   Title Patient will be independent with her HEP.    Time 6    Period Weeks    Status On-going      PT LONG TERM GOAL #2   Title Patient will be able to demonstrate at least 120 degrees of active shoulder  flexion for improved function reaching overhead.    Time 6    Period Weeks    Status On-going      PT LONG TERM GOAL #3   Title Patient will be able to demonstrate at least 120 degrees of active shoulder abduction for improved function reaching.    Time 6    Period Weeks    Status On-going      PT LONG TERM GOAL #4   Status On-going      PT LONG TERM GOAL #5   Status On-going                   Plan - 09/08/21 0815     Clinical Impression Statement Patient presented to treatment reporting increased right shoulder soreness after her last appointment. As a result, treatment focused on familiar interventions for imprroved right shoulder mobility. She noted the most significant relief with AAROM with the Nustep today. Manual therapy focused on improved shoulder flexion, scaption, and abduction through the use of passive range of motion. She reported that her shoulder felt good upon the conclusion of treatment. She continues to require skilled physical therapy to address her remaining impairments to return    Personal Factors and Comorbidities Other;Time since onset of injury/illness/exacerbation    Examination-Activity Limitations Bathing;Reach Overhead;Carry;Dressing;Hygiene/Grooming;Lift    Examination-Participation Restrictions Community Activity;Shop;Yard Work    Stability/Clinical Decision Making Stable/Uncomplicated    Rehab Potential Good    PT Frequency 2x / week    PT Duration 6 weeks    PT Treatment/Interventions  Electrical Stimulation;Neuromuscular re-education;Therapeutic exercise;Therapeutic activities;Patient/family education;Manual techniques;Energy conservation;Passive range of motion;Scar mobilization;Taping;Vasopneumatic Device    PT Next Visit Plan DOS: 07/20/21; see protocol; isometrics    PT Home Exercise Plan scapular retraction, bicep curl, and towel squeeze    Consulted and Agree with Plan of Care Patient             Patient will benefit from skilled  therapeutic intervention in order to improve the following deficits and impairments:  Decreased range of motion, Impaired UE functional use, Pain, Decreased strength  Visit Diagnosis: Stiffness of right shoulder, not elsewhere classified  Muscle weakness (generalized)     Problem List Patient Active Problem List   Diagnosis Date Noted   Positive urine culture 09/05/2021   Dysuria 08/29/2021   Elevated alkaline phosphatase level 02/02/2021   S/P reverse total shoulder arthroplasty, left 11/12/2019   AKI (acute kidney injury) ([place]) 10/05/2019   Anterior dislocation of left shoulder 09/26/2019   Hx of colonic polyps 09/04/2019   Chest pain 09/04/2019   Closed fracture of shaft of right ulna 07/08/2019   Fracture of shaft of ulna 07/03/2019   Benzodiazepine dependence ([place]) 04/13/2019   Controlled substance agreement signed 04/13/2019   Urticaria 09/08/2018   S/p reverse total shoulder arthroplasty 07/17/2018   History of colon cancer 06/12/2018   History of colonic polyps 06/12/2018   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   Gastroesophageal reflux disease 06/12/2018   Rotator cuff tear arthropathy 05/28/2018   Hypertension 04/08/2018   Chronic diarrhea 06/26/2017   Iron deficiency anemia 06/26/2017   GAD (generalized anxiety disorder) 08/28/2016   Vitamin D deficiency 08/28/2016   Insomnia 08/28/2016   Immunosuppression ([place]) 12/02/2015   Liver transplant recipient [facility]) 11/30/2015   Seizures ([place]) 09/27/2015   Hx of liver transplant ([place]) 12/08/2014   Colon cancer ([place]) 12/08/2014   Adenocarcinoma in a polyp ([place]) 09/17/2014   Portal vein thrombosis 11/19/2013   Hepatic encephalopathy 10/26/2013   Personal history of colonic polyps 12/27/2012   Hypothyroidism 03/24/2012   Ventral hernia, recurrent 03/24/2012   Hernia 09/04/2011   Other secondary pulmonary hypertension ([place]) 08/16/2011    Darlin Coco, PT 09/08/2021, 12:27 PM  [facility] [address] [place], [place], [address] Phone: [phone]   Fax:  [phone]  Name: Cassidy Barber MRN: [id number] Date of Birth: [birth year]

## 2021-09-12 ENCOUNTER — Other Ambulatory Visit: Payer: Self-pay

## 2021-09-12 ENCOUNTER — Ambulatory Visit: Payer: Medicare Other | Attending: Orthopedic Surgery

## 2021-09-12 DIAGNOSIS — M25611 Stiffness of right shoulder, not elsewhere classified: Secondary | ICD-10-CM | POA: Diagnosis not present

## 2021-09-12 DIAGNOSIS — M6281 Muscle weakness (generalized): Secondary | ICD-10-CM | POA: Diagnosis not present

## 2021-09-12 NOTE — Therapy (Signed)
[facility] [place], [place], [address] Phone: [phone]   Fax:  [phone]  Physical Therapy Treatment  Patient Details  Name: Cassidy Barber MRN: [id number] Date of Birth: 22-Sep-1951 Referring Provider (PT): Supple   Encounter Date: 09/12/2021   PT End of Session - 09/12/21 3244     Visit Number 6    Number of Visits 13    Date for PT Re-Evaluation 10/04/21    PT Start Time 0817    PT Stop Time 0901    PT Time Calculation (min) 44 min    Activity Tolerance Patient tolerated treatment well    Behavior During Therapy [facility] for tasks assessed/performed             Past Medical History:  Diagnosis Date   Abdominal wall hernia    Incarcerated status post surgical repair 2019 - [facility]   Anemia of chronic disease    Atypical nevus 01/21/2018   atypical neoplasm- Left scalp-ant (txpbx + MOHS), atypical neoplasm- Left scalp post- (txpbx + MOHS)   Basal cell carcinoma    Colon cancer ([place])    Colon surgery 2005 and 2012   History of pulmonary hypertension    Pre liver transplant   Hypertension    Hypothyroidism    Lynch syndrome    Osteopenia    Primary biliary cirrhosis ([place])    Status post liver transplantation - follows at [place] (squamous cell carcinoma) of skin 02/23/2020   Right Upper Chest(moderate) (MOH's)   SCCA (squamous cell carcinoma) of skin 04/07/2020   Left Top Leg (Keratoacanthoma) treatment after biopsy   SCCA (squamous cell carcinoma) of skin 04/07/2020   Left Foot Dorsal (in situ) treatment after biopsy   SCCA (squamous cell carcinoma) of skin 03/27/2021   Right Upper Back (Keratoacanthoma) (excision) (clear)   SCCA (squamous cell carcinoma) of skin 06/13/2021   Right Shoulder - anterior (moderately differentiated) (tx p bx)   SCCA (squamous cell carcinoma) of skin 06/13/2021   Right Thigh - anterior (well differentiated) (tx p bx)   SCCA (squamous cell carcinoma) of skin 06/13/2021    Right Lower Leg - anterior (well differentiated) (tx p bx)   Squamous cell carcinoma of skin 04/22/2018   KA-Right mid chest (txpbx), KA-left elbow crease (txpbx), insitu-Right mid chest inf. (exc)   Squamous cell carcinoma of skin 05/20/2018   well diff-Left upper shin (txpbx), well diff-Right lower forearm (txpbx), well diff-Right upper shin (txpbx)   Squamous cell carcinoma of skin 06/11/2018   Scc + margin-Right mid chest inferior    Squamous cell carcinoma of skin 06/27/2018   well diff-Left mid thigh(txpbx), well diff-Left inner thigh (txpbx),insitu-Right cheek (txpbx),well diff-right inner heel (txpbx)   Squamous cell carcinoma of skin 09/16/2018   well diff-left shoulder (txpbx), well diff-Right chin (txpbx), well diff-right chest lateral (CX35FU)   Squamous cell carcinoma of skin 10/01/2018   Right outer lower shin (Txpbx)   Squamous cell carcinoma of skin 04/03/2019   well diff-Right center chest (MOHS), in situ-Right ear   Squamous cell carcinoma of skin 08/05/2019   in situ-Left calf (txpbx), in situ-left bicep (txpbx), well diff-Left chest,inf(txpbx), in situ-Right chest inf-(txpbx)   Squamous cell carcinoma of skin 11/19/2019   KA-left top leg (txpbx), modify-Riight forehead-(mohs), in situ-right hand (txpbx), in situ-Right forearm (txpbx), well diff-Right chest (txpbx), well diff-chin (txpbx)   Squamous cell carcinoma of skin 01/06/2020   KA- Left top leg   Squamous cell carcinoma of  skin 05/12/2013   bowens-middle of chest (CX35FU)   Squamous cell carcinoma of skin 05/18/2015   well diff-Left upper arm (CX35FU + Exc),KA-right chest(txpbx), in situ-Left shin (txpbx), well diff-Right cheek (CX35FU), KA-Left post scalp (CX35FU)   Squamous cell carcinoma of skin 08/09/2015   KA-Left post scalp ((MOHS), in situ- mid chest (Txpbx +exc), in situ-Left upper arm inferior (txpbx)   Squamous cell carcinoma of skin 10/13/2015   Left upper arm-clear   Squamous cell carcinoma of skin  03/09/2016   mod diff-mid chest (txpbx+ exc), mod diff-Right chest (txpbx+exc), well diff-right cheek-(txpbx),well diff-Left hand-(txpbx), in situ-Left upper arm (txpbx), well diff-Right cheek -(txpbx), well diff-Right crease arm (txpbx)    Squamous cell carcinoma of skin 05/24/2016   well diff-Right nasal crease-(MOHS)   Squamous cell carcinoma of skin 08/02/2016   KA-Left chest med (txpbx)   Squamous cell carcinoma of skin 08/30/2016   in situ-Left outer zygoma (txpbx)   Squamous cell carcinoma of skin 12/06/2016   well diff-Left chest sup, Left shoudler, insitu- right post scalp   Squamous cell carcinoma of skin 02/14/2017   well diff-Left forearm (EXC),in situ-RIght ant neck   Squamous cell carcinoma of skin 06/14/2017   in situ-Right forearm (txpbx), in situ-Right chest (txpbx), well diff-left chest (txpbx), well diff-anterior neck- (txpbx)   Squamous cell carcinoma of skin 08/07/2017   well diff-Left upper shoulder (txpbx), sup and invasive-Left temple (txpbx), well diff-Right upper shin (txpbx), in situ-Right clavicle (txpbx)   Squamous cell carcinoma of skin 10/17/2017   well diff-ant. neck (MOHS), in situ-Right chest, inf (txpbx)   Squamous cell carcinoma of skin 01/21/2018   well diff- Right chest,ulnar (txpbx), well diff- right upper chest (txpbx), in situ-Right ant. crown (txpbx)   Squamous cell carcinoma of skin 08/02/2020   well diff-left lower leg-inferior (Txpbx)   Squamous cell carcinoma of skin 08/02/2020   well diff-right lower leg-mid (txpbx)   Squamous cell carcinoma of skin 08/02/2020   well diff-left chest upper   Squamous cell carcinoma of skin 08/02/2020   well diff-mid parietal scalp (MOHS)   Squamous cell carcinoma of skin 08/02/2020   well diff-right foot inner(txpbx)   Squamous cell carcinoma of skin 08/02/2020   well diff- left lower leg medial (txpbx)   Squamous cell carcinoma of skin 08/02/2020   well diff-left lower leg anterior (txpbx)   Squamous  cell carcinoma of skin 08/02/2020   well diff-left lower leg medial (txpbx)   Squamous cell carcinoma of skin 08/02/2020   well diff-right forearm-posterior (txpbx)    Past Surgical History:  Procedure Laterality Date   ABDOMINAL HERNIA REPAIR     Patient's states that she has had 8- 9 hernia surgeries   ABDOMINAL HYSTERECTOMY     BIOPSY  02/15/2021   Procedure: BIOPSY;  Surgeon: Rogene Houston, MD;  [facility];  Service: Endoscopy;;   CATARACT EXTRACTION W/PHACO Right 01/15/2020   Procedure: CATARACT EXTRACTION PHACO AND INTRAOCULAR LENS PLACEMENT ([place]);  Surgeon: Baruch Goldmann, MD;  Location: [facility];  Service: Ophthalmology;  Laterality: Right;  CDE: 7.89   CATARACT EXTRACTION W/PHACO Left 01/29/2020   Procedure: CATARACT EXTRACTION PHACO AND INTRAOCULAR LENS PLACEMENT (IOC) (CDE: 6.33);  Surgeon: Baruch Goldmann, MD;  Location: [facility];  Service: Ophthalmology;  Laterality: Left;   CHOLECYSTECTOMY  2007   COLON SURGERY  2008   Done at [facility]   COLONOSCOPY     Done at [facility]   ESOPHAGOGASTRODUODENOSCOPY N/A 08/21/2018   Procedure: ESOPHAGOGASTRODUODENOSCOPY (EGD);  Surgeon: Rogene Houston, MD;  [facility];  Service: Endoscopy;  Laterality: N/A;   ESOPHAGOGASTRODUODENOSCOPY (EGD) WITH PROPOFOL N/A 12/16/2019   Procedure: ESOPHAGOGASTRODUODENOSCOPY (EGD) WITH PROPOFOL;  Surgeon: Rogene Houston, MD;  [facility];  Service: Endoscopy;  Laterality: N/A;   EYE SURGERY     lasix   FLEXIBLE SIGMOIDOSCOPY N/A 10/20/2015   Procedure: FLEXIBLE SIGMOIDOSCOPY;  Surgeon: Rogene Houston, MD;  [facility];  Service: Endoscopy;  Laterality: N/A;  22 - Dr Laural Golden has meeting until 1:00   FLEXIBLE SIGMOIDOSCOPY N/A 07/11/2016   Procedure: FLEXIBLE SIGMOIDOSCOPY;  Surgeon: Rogene Houston, MD;  [facility];  Service: Endoscopy;  Laterality: N/A;  Saline N/A 08/09/2017   Procedure: FLEXIBLE SIGMOIDOSCOPY;  Surgeon:  Rogene Houston, MD;  [facility];  Service: Endoscopy;  Laterality: N/A;  1:00   FLEXIBLE SIGMOIDOSCOPY N/A 08/21/2018   Procedure: FLEXIBLE SIGMOIDOSCOPY;  Surgeon: Rogene Houston, MD;  [facility];  Service: Endoscopy;  Laterality: N/A;   FLEXIBLE SIGMOIDOSCOPY N/A 12/16/2019   Procedure: FLEXIBLE SIGMOIDOSCOPY wirh Propofol;  Surgeon: Rogene Houston, MD;  [facility];  Service: Endoscopy;  Laterality: N/A;  7:30   FLEXIBLE SIGMOIDOSCOPY N/A 02/15/2021   Procedure: FLEXIBLE SIGMOIDOSCOPY WITH PROPOFOL;  Surgeon: Rogene Houston, MD;  [facility];  Service: Endoscopy;  Laterality: N/A;  am   FRACTURE SURGERY     right wrist metal plate   LIVER TRANSPLANT  11/19/2013   POLYPECTOMY  08/09/2017   Procedure: POLYPECTOMY;  Surgeon: Rogene Houston, MD;  [facility];  Service: Endoscopy;;  colon small bowel   REVERSE SHOULDER ARTHROPLASTY Left 07/17/2018   Procedure: LEFT REVERSE SHOULDER ARTHROPLASTY;  Surgeon: Justice Britain, MD;  Location: [place];  Service: Orthopedics;  Laterality: Left;  150min   REVERSE SHOULDER ARTHROPLASTY Right 07/20/2021   Procedure: REVERSE SHOULDER ARTHROPLASTY;  Surgeon: Justice Britain, MD;  [facility];  Service: Orthopedics;  Laterality: Right;   SHOULDER CLOSED REDUCTION Left 09/27/2019   Procedure: CLOSED REDUCTION SHOULDER;  Surgeon: Paralee Cancel, MD;  [facility];  Service: Orthopedics;  Laterality: Left;   SPLENECTOMY  2006   TOTAL SHOULDER REVISION Left 11/12/2019   Procedure: Revision Left Reverse Shoulder Arthroplasty with poly exchange SDD;  Surgeon: Justice Britain, MD;  [facility];  Service: Orthopedics;  Laterality: Left;  189min -SDDC   TYMPANOSTOMY TUBE PLACEMENT     UPPER GASTROINTESTINAL ENDOSCOPY     Done at [facility]    There were no vitals filed for this visit.   Subjective Assessment - 09/12/21 0818     Subjective Patient reports that both her shoulders are sore today.  She notes that she had to use ice twice yesterday because her shoulder was so sore.    Pertinent History Reverse TSA (L: 2021) (R: 07/20/21), history of skin cancer    Limitations Lifting;Writing    Patient Stated Goals make crafts, reaching overhead,    Currently in Pain? Yes    Pain Score 6     Pain Location Shoulder    Pain Orientation Right;Left    Pain Descriptors / Indicators Sore    Pain Type Surgical pain    Pain Onset More than a month ago    Pain Frequency Constant                [facility] PT Assessment - 09/12/21 0001  Observation/Other Assessments   Focus on Therapeutic Outcomes (FOTO)  Completed   54.45     AROM   Right Shoulder Flexion 86 Degrees    Right Shoulder ABduction 84 Degrees                           [facility] Adult PT Treatment/Exercise - 09/12/21 0001       Shoulder Exercises: Seated   Extension Both;Strengthening;20 reps   2 sets; green XTS to neutral     Shoulder Exercises: Standing   External Rotation AAROM;Right;20 reps    Other Standing Exercises Finger ladder   135 degrees shoulder flexion; #24     Shoulder Exercises: Pulleys   Flexion 2 minutes    ABduction 3 minutes      Shoulder Exercises: Therapy Ball   Flexion 25 reps;Both      Shoulder Exercises: ROM/Strengthening   [place] Seated; 3 minutes                     PT Education - 09/12/21 1102     Education Details Progress with therapy, AROM improvements,    Person(s) Educated Patient    Methods Explanation    Comprehension Verbalized understanding              PT Short Term Goals - 09/12/21 0841       PT SHORT TERM GOAL #1   Title Patient will be able to demonstrate at least 60 degrees of shoulder flexion for improved function reaching.    Baseline 86    Time 3    Period Weeks    Status Achieved    Target Date 09/13/21      PT SHORT TERM GOAL #2   Title Patient will be able to demonstrate at least 60 degrees of shoulder abduction  for improved function reaching for her daily activities.    Baseline 84    Time 3    Period Weeks    Status Achieved    Target Date 09/13/21               PT Long Term Goals - 09/12/21 0843       PT LONG TERM GOAL #1   Title Patient will be independent with her HEP.    Time 6    Period Weeks    Status On-going      PT LONG TERM GOAL #2   Title Patient will be able to demonstrate at least 120 degrees of active shoulder flexion for improved function reaching overhead.    Baseline 86    Time 6    Period Weeks    Status On-going      PT LONG TERM GOAL #3   Title Patient will be able to demonstrate at least 120 degrees of active shoulder abduction for improved function reaching.    Baseline 84    Time 6    Period Weeks    Status On-going      PT LONG TERM GOAL #4   Status On-going      PT LONG TERM GOAL #5   Status On-going                   Plan - 09/12/21 0823     Clinical Impression Statement Treatment was initiated with active assisted range of motion interventions for improved shoulder mobility prior to her active range of motion being assessed. She was able to achieve 86  degrees of flexion and 84 degrees of abduction. Weakness and fatigue were her primary limitations as she was able to achieve 135 degrees of AAROM after a seated rest break between interventions. She reported feeling alright upon the conclusion of treatment. She continues to require skilled physical therapy to address her remaining impairments to return to her prior level of function.    Personal Factors and Comorbidities Other;Time since onset of injury/illness/exacerbation    Examination-Activity Limitations Bathing;Reach Overhead;Carry;Dressing;Hygiene/Grooming;Lift    Examination-Participation Restrictions Community Activity;Shop;Yard Work    Stability/Clinical Decision Making Stable/Uncomplicated    Rehab Potential Good    PT Frequency 2x / week    PT Duration 6 weeks    PT  Treatment/Interventions Electrical Stimulation;Neuromuscular re-education;Therapeutic exercise;Therapeutic activities;Patient/family education;Manual techniques;Energy conservation;Passive range of motion;Scar mobilization;Taping;Vasopneumatic Device    PT Next Visit Plan DOS: 07/20/21; see protocol; isometrics    PT Home Exercise Plan scapular retraction, bicep curl, and towel squeeze    Consulted and Agree with Plan of Care Patient             Patient will benefit from skilled therapeutic intervention in order to improve the following deficits and impairments:  Decreased range of motion, Impaired UE functional use, Pain, Decreased strength  Visit Diagnosis: Stiffness of right shoulder, not elsewhere classified  Muscle weakness (generalized)     Problem List Patient Active Problem List   Diagnosis Date Noted   Positive urine culture 09/05/2021   Dysuria 08/29/2021   Elevated alkaline phosphatase level 02/02/2021   S/P reverse total shoulder arthroplasty, left 11/12/2019   AKI (acute kidney injury) ([place]) 10/05/2019   Anterior dislocation of left shoulder 09/26/2019   Hx of colonic polyps 09/04/2019   Chest pain 09/04/2019   Closed fracture of shaft of right ulna 07/08/2019   Fracture of shaft of ulna 07/03/2019   Benzodiazepine dependence ([place]) 04/13/2019   Controlled substance agreement signed 04/13/2019   Urticaria 09/08/2018   S/p reverse total shoulder arthroplasty 07/17/2018   History of colon cancer 06/12/2018   History of colonic polyps 06/12/2018   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   Gastroesophageal reflux disease 06/12/2018   Rotator cuff tear arthropathy 05/28/2018   Hypertension 04/08/2018   Chronic diarrhea 06/26/2017   Iron deficiency anemia 06/26/2017   GAD (generalized anxiety disorder) 08/28/2016   Vitamin D deficiency 08/28/2016   Insomnia 08/28/2016   Immunosuppression ([place]) 12/02/2015   Liver transplant recipient [facility]) 11/30/2015   Seizures ([place])  09/27/2015   Hx of liver transplant ([place]) 12/08/2014   Colon cancer ([place]) 12/08/2014   Adenocarcinoma in a polyp ([place]) 09/17/2014   Portal vein thrombosis 11/19/2013   Hepatic encephalopathy 10/26/2013   Personal history of colonic polyps 12/27/2012   Hypothyroidism 03/24/2012   Ventral hernia, recurrent 03/24/2012   Hernia 09/04/2011   Other secondary pulmonary hypertension ([place]) 08/16/2011    Darlin Coco, PT 09/12/2021, 11:04 AM  [facility] [place], [place], [address] Phone: [phone]   Fax:  [phone]  Name: RIDHIMA GOLBERG MRN: [id number] Date of Birth: [birth year]

## 2021-09-13 ENCOUNTER — Inpatient Hospital Stay (HOSPITAL_COMMUNITY): Payer: Medicare Other

## 2021-09-14 ENCOUNTER — Ambulatory Visit: Payer: Medicare Other

## 2021-09-14 ENCOUNTER — Other Ambulatory Visit: Payer: Self-pay

## 2021-09-14 ENCOUNTER — Inpatient Hospital Stay (HOSPITAL_COMMUNITY): Payer: Medicare Other | Attending: Hematology

## 2021-09-14 DIAGNOSIS — D508 Other iron deficiency anemias: Secondary | ICD-10-CM

## 2021-09-14 DIAGNOSIS — M25611 Stiffness of right shoulder, not elsewhere classified: Secondary | ICD-10-CM

## 2021-09-14 DIAGNOSIS — Z944 Liver transplant status: Secondary | ICD-10-CM | POA: Diagnosis not present

## 2021-09-14 DIAGNOSIS — D509 Iron deficiency anemia, unspecified: Secondary | ICD-10-CM | POA: Diagnosis present

## 2021-09-14 DIAGNOSIS — M6281 Muscle weakness (generalized): Secondary | ICD-10-CM | POA: Diagnosis not present

## 2021-09-14 DIAGNOSIS — Z79899 Other long term (current) drug therapy: Secondary | ICD-10-CM | POA: Diagnosis not present

## 2021-09-14 LAB — CBC WITH DIFFERENTIAL/PLATELET
Abs Immature Granulocytes: 0.02 10*3/uL (ref 0.00–0.07)
Basophils Absolute: 0.1 10*3/uL (ref 0.0–0.1)
Basophils Relative: 1 %
Eosinophils Absolute: 0.2 10*3/uL (ref 0.0–0.5)
Eosinophils Relative: 3 %
HCT: 31.3 % — ABNORMAL LOW (ref 36.0–46.0)
Hemoglobin: 9.2 g/dL — ABNORMAL LOW (ref 12.0–15.0)
Immature Granulocytes: 0 %
Lymphocytes Relative: 20 %
Lymphs Abs: 1.3 10*3/uL (ref 0.7–4.0)
MCH: 27.3 pg (ref 26.0–34.0)
MCHC: 29.4 g/dL — ABNORMAL LOW (ref 30.0–36.0)
MCV: 92.9 fL (ref 80.0–100.0)
Monocytes Absolute: 0.7 10*3/uL (ref 0.1–1.0)
Monocytes Relative: 11 %
Neutro Abs: 4.4 10*3/uL (ref 1.7–7.7)
Neutrophils Relative %: 65 %
Platelets: 401 10*3/uL — ABNORMAL HIGH (ref 150–400)
RBC: 3.37 MIL/uL — ABNORMAL LOW (ref 3.87–5.11)
RDW: 15.5 % (ref 11.5–15.5)
WBC: 6.7 10*3/uL (ref 4.0–10.5)
nRBC: 0 % (ref 0.0–0.2)

## 2021-09-14 LAB — IRON AND TIBC
Iron: 16 ug/dL — ABNORMAL LOW (ref 28–170)
Saturation Ratios: 5 % — ABNORMAL LOW (ref 10.4–31.8)
TIBC: 334 ug/dL (ref 250–450)
UIBC: 318 ug/dL

## 2021-09-14 LAB — VITAMIN B12: Vitamin B-12: 577 pg/mL (ref 180–914)

## 2021-09-14 LAB — FERRITIN: Ferritin: 22 ng/mL (ref 11–307)

## 2021-09-14 NOTE — Therapy (Signed)
[facility] [place], [place], [address] Phone: [phone]   Fax:  [phone]  Physical Therapy Treatment  Patient Details  Name: Cassidy Barber MRN: [id number] Date of Birth: [birth year] Referring Provider (PT): Supple   Encounter Date: 09/14/2021   PT End of Session - 09/14/21 0842     Visit Number 7    Number of Visits 13    Date for PT Re-Evaluation 10/04/21    PT Start Time 0816    PT Stop Time 0858    PT Time Calculation (min) 42 min    Activity Tolerance Patient tolerated treatment well    Behavior During Therapy [facility] for tasks assessed/performed             Past Medical History:  Diagnosis Date   Abdominal wall hernia    Incarcerated status post surgical repair 2019 - [facility]   Anemia of chronic disease    Atypical nevus 01/21/2018   atypical neoplasm- Left scalp-ant (txpbx + MOHS), atypical neoplasm- Left scalp post- (txpbx + MOHS)   Basal cell carcinoma    Colon cancer ([place])    Colon surgery 2005 and 2012   History of pulmonary hypertension    Pre liver transplant   Hypertension    Hypothyroidism    Lynch syndrome    Osteopenia    Primary biliary cirrhosis ([place])    Status post liver transplantation - follows at [place] (squamous cell carcinoma) of skin 02/23/2020   Right Upper Chest(moderate) (MOH's)   SCCA (squamous cell carcinoma) of skin 04/07/2020   Left Top Leg (Keratoacanthoma) treatment after biopsy   SCCA (squamous cell carcinoma) of skin 04/07/2020   Left Foot Dorsal (in situ) treatment after biopsy   SCCA (squamous cell carcinoma) of skin 03/27/2021   Right Upper Back (Keratoacanthoma) (excision) (clear)   SCCA (squamous cell carcinoma) of skin 06/13/2021   Right Shoulder - anterior (moderately differentiated) (tx p bx)   SCCA (squamous cell carcinoma) of skin 06/13/2021   Right Thigh - anterior (well differentiated) (tx p bx)   SCCA (squamous cell carcinoma) of skin 06/13/2021    Right Lower Leg - anterior (well differentiated) (tx p bx)   Squamous cell carcinoma of skin 04/22/2018   KA-Right mid chest (txpbx), KA-left elbow crease (txpbx), insitu-Right mid chest inf. (exc)   Squamous cell carcinoma of skin 05/20/2018   well diff-Left upper shin (txpbx), well diff-Right lower forearm (txpbx), well diff-Right upper shin (txpbx)   Squamous cell carcinoma of skin 06/11/2018   Scc + margin-Right mid chest inferior    Squamous cell carcinoma of skin 06/27/2018   well diff-Left mid thigh(txpbx), well diff-Left inner thigh (txpbx),insitu-Right cheek (txpbx),well diff-right inner heel (txpbx)   Squamous cell carcinoma of skin 09/16/2018   well diff-left shoulder (txpbx), well diff-Right chin (txpbx), well diff-right chest lateral (CX35FU)   Squamous cell carcinoma of skin 10/01/2018   Right outer lower shin (Txpbx)   Squamous cell carcinoma of skin 04/03/2019   well diff-Right center chest (MOHS), in situ-Right ear   Squamous cell carcinoma of skin 08/05/2019   in situ-Left calf (txpbx), in situ-left bicep (txpbx), well diff-Left chest,inf(txpbx), in situ-Right chest inf-(txpbx)   Squamous cell carcinoma of skin 11/19/2019   KA-left top leg (txpbx), modify-Riight forehead-(mohs), in situ-right hand (txpbx), in situ-Right forearm (txpbx), well diff-Right chest (txpbx), well diff-chin (txpbx)   Squamous cell carcinoma of skin 01/06/2020   KA- Left top leg   Squamous cell carcinoma of  skin 05/12/2013   bowens-middle of chest (CX35FU)   Squamous cell carcinoma of skin 05/18/2015   well diff-Left upper arm (CX35FU + Exc),KA-right chest(txpbx), in situ-Left shin (txpbx), well diff-Right cheek (CX35FU), KA-Left post scalp (CX35FU)   Squamous cell carcinoma of skin 08/09/2015   KA-Left post scalp ((MOHS), in situ- mid chest (Txpbx +exc), in situ-Left upper arm inferior (txpbx)   Squamous cell carcinoma of skin 10/13/2015   Left upper arm-clear   Squamous cell carcinoma of skin  03/09/2016   mod diff-mid chest (txpbx+ exc), mod diff-Right chest (txpbx+exc), well diff-right cheek-(txpbx),well diff-Left hand-(txpbx), in situ-Left upper arm (txpbx), well diff-Right cheek -(txpbx), well diff-Right crease arm (txpbx)    Squamous cell carcinoma of skin 05/24/2016   well diff-Right nasal crease-(MOHS)   Squamous cell carcinoma of skin 08/02/2016   KA-Left chest med (txpbx)   Squamous cell carcinoma of skin 08/30/2016   in situ-Left outer zygoma (txpbx)   Squamous cell carcinoma of skin 12/06/2016   well diff-Left chest sup, Left shoudler, insitu- right post scalp   Squamous cell carcinoma of skin 02/14/2017   well diff-Left forearm (EXC),in situ-RIght ant neck   Squamous cell carcinoma of skin 06/14/2017   in situ-Right forearm (txpbx), in situ-Right chest (txpbx), well diff-left chest (txpbx), well diff-anterior neck- (txpbx)   Squamous cell carcinoma of skin 08/07/2017   well diff-Left upper shoulder (txpbx), sup and invasive-Left temple (txpbx), well diff-Right upper shin (txpbx), in situ-Right clavicle (txpbx)   Squamous cell carcinoma of skin 10/17/2017   well diff-ant. neck (MOHS), in situ-Right chest, inf (txpbx)   Squamous cell carcinoma of skin 01/21/2018   well diff- Right chest,ulnar (txpbx), well diff- right upper chest (txpbx), in situ-Right ant. crown (txpbx)   Squamous cell carcinoma of skin 08/02/2020   well diff-left lower leg-inferior (Txpbx)   Squamous cell carcinoma of skin 08/02/2020   well diff-right lower leg-mid (txpbx)   Squamous cell carcinoma of skin 08/02/2020   well diff-left chest upper   Squamous cell carcinoma of skin 08/02/2020   well diff-mid parietal scalp (MOHS)   Squamous cell carcinoma of skin 08/02/2020   well diff-right foot inner(txpbx)   Squamous cell carcinoma of skin 08/02/2020   well diff- left lower leg medial (txpbx)   Squamous cell carcinoma of skin 08/02/2020   well diff-left lower leg anterior (txpbx)   Squamous  cell carcinoma of skin 08/02/2020   well diff-left lower leg medial (txpbx)   Squamous cell carcinoma of skin 08/02/2020   well diff-right forearm-posterior (txpbx)    Past Surgical History:  Procedure Laterality Date   ABDOMINAL HERNIA REPAIR     Patient's states that she has had 8- 9 hernia surgeries   ABDOMINAL HYSTERECTOMY     BIOPSY  02/15/2021   Procedure: BIOPSY;  Surgeon: Rogene Houston, MD;  [facility];  Service: Endoscopy;;   CATARACT EXTRACTION W/PHACO Right 01/15/2020   Procedure: CATARACT EXTRACTION PHACO AND INTRAOCULAR LENS PLACEMENT ([place]);  Surgeon: Baruch Goldmann, MD;  Location: [facility];  Service: Ophthalmology;  Laterality: Right;  CDE: 7.89   CATARACT EXTRACTION W/PHACO Left 01/29/2020   Procedure: CATARACT EXTRACTION PHACO AND INTRAOCULAR LENS PLACEMENT (IOC) (CDE: 6.33);  Surgeon: Baruch Goldmann, MD;  Location: [facility];  Service: Ophthalmology;  Laterality: Left;   CHOLECYSTECTOMY  2007   COLON SURGERY  2008   [facility]   COLONOSCOPY     Done at [facility]   ESOPHAGOGASTRODUODENOSCOPY N/A 08/21/2018   Procedure: ESOPHAGOGASTRODUODENOSCOPY (EGD);  Surgeon: Rogene Houston, MD;  [facility];  Service: Endoscopy;  Laterality: N/A;   ESOPHAGOGASTRODUODENOSCOPY (EGD) WITH PROPOFOL N/A 12/16/2019   Procedure: ESOPHAGOGASTRODUODENOSCOPY (EGD) WITH PROPOFOL;  Surgeon: Rogene Houston, MD;  [facility];  Service: Endoscopy;  Laterality: N/A;   EYE SURGERY     lasix   FLEXIBLE SIGMOIDOSCOPY N/A 10/20/2015   Procedure: FLEXIBLE SIGMOIDOSCOPY;  Surgeon: Rogene Houston, MD;  [facility];  Service: Endoscopy;  Laterality: N/A;  44 - Dr Laural Golden has meeting until 1:00   FLEXIBLE SIGMOIDOSCOPY N/A 07/11/2016   Procedure: FLEXIBLE SIGMOIDOSCOPY;  Surgeon: Rogene Houston, MD;  [facility];  Service: Endoscopy;  Laterality: N/A;  [place] N/A 08/09/2017   Procedure: FLEXIBLE SIGMOIDOSCOPY;  Surgeon:  Rogene Houston, MD;  [facility];  Service: Endoscopy;  Laterality: N/A;  1:00   FLEXIBLE SIGMOIDOSCOPY N/A 08/21/2018   Procedure: FLEXIBLE SIGMOIDOSCOPY;  Surgeon: Rogene Houston, MD;  [facility];  Service: Endoscopy;  Laterality: N/A;   FLEXIBLE SIGMOIDOSCOPY N/A 12/16/2019   Procedure: FLEXIBLE SIGMOIDOSCOPY wirh Propofol;  Surgeon: Rogene Houston, MD;  [facility];  Service: Endoscopy;  Laterality: N/A;  7:30   FLEXIBLE SIGMOIDOSCOPY N/A 02/15/2021   Procedure: FLEXIBLE SIGMOIDOSCOPY WITH PROPOFOL;  Surgeon: Rogene Houston, MD;  [facility];  Service: Endoscopy;  Laterality: N/A;  am   FRACTURE SURGERY     right wrist metal plate   LIVER TRANSPLANT  11/19/2013   POLYPECTOMY  08/09/2017   Procedure: POLYPECTOMY;  Surgeon: Rogene Houston, MD;  [facility];  Service: Endoscopy;;  colon small bowel   REVERSE SHOULDER ARTHROPLASTY Left 07/17/2018   Procedure: LEFT REVERSE SHOULDER ARTHROPLASTY;  Surgeon: Justice Britain, MD;  Location: [place];  Service: Orthopedics;  Laterality: Left;  11min   REVERSE SHOULDER ARTHROPLASTY Right 07/20/2021   Procedure: REVERSE SHOULDER ARTHROPLASTY;  Surgeon: Justice Britain, MD;  [facility];  Service: Orthopedics;  Laterality: Right;   SHOULDER CLOSED REDUCTION Left 09/27/2019   Procedure: CLOSED REDUCTION SHOULDER;  Surgeon: Paralee Cancel, MD;  [facility];  Service: Orthopedics;  Laterality: Left;   SPLENECTOMY  2006   TOTAL SHOULDER REVISION Left 11/12/2019   Procedure: Revision Left Reverse Shoulder Arthroplasty with poly exchange SDD;  Surgeon: Justice Britain, MD;  [facility];  Service: Orthopedics;  Laterality: Left;  168min -SDDC   TYMPANOSTOMY TUBE PLACEMENT     UPPER GASTROINTESTINAL ENDOSCOPY     Done at [facility]    There were no vitals filed for this visit.   Subjective Assessment - 09/14/21 0817     Subjective Patient reports that her shoulder is a little sore  today. However, it is not as sore as normal. She feels that her iron is low, but she is going to find out today.    Pertinent History Reverse TSA (L: 2021) (R: 07/20/21), history of skin cancer    Limitations Lifting;Writing    Patient Stated Goals make crafts, reaching overhead,    Currently in Pain? Yes    Pain Score 3     Pain Location Shoulder    Pain Orientation Right    Pain Descriptors / Indicators Sore    Pain Type Surgical pain    Pain Onset More than a month ago  [facility] Adult PT Treatment/Exercise - 09/14/21 0001       Shoulder Exercises: Supine   External Rotation AAROM;Right   with cane; 2 minutes   Flexion AAROM;Both   3 minutes   Other Supine Exercises Cane press up   2 minutes     Shoulder Exercises: Prone   Internal Rotation Both;AROM   2 minutes     Shoulder Exercises: Standing   Extension Strengthening;Both   Green XTS; 2x15     Shoulder Exercises: ROM/Strengthening   Nustep L5 x 12 minutes    Ranger Standing; 2 minutes   moderate soreness   Wall Pushups Other (comment)   Wall taps to opposite UE; 1.5 minutes                      PT Short Term Goals - 09/12/21 0841       PT SHORT TERM GOAL #1   Title Patient will be able to demonstrate at least 60 degrees of shoulder flexion for improved function reaching.    Baseline 86    Time 3    Period Weeks    Status Achieved    Target Date 09/13/21      PT SHORT TERM GOAL #2   Title Patient will be able to demonstrate at least 60 degrees of shoulder abduction for improved function reaching for her daily activities.    Baseline 84    Time 3    Period Weeks    Status Achieved    Target Date 09/13/21               PT Long Term Goals - 09/12/21 0843       PT LONG TERM GOAL #1   Title Patient will be independent with her HEP.    Time 6    Period Weeks    Status On-going      PT LONG TERM GOAL #2   Title Patient will be able to  demonstrate at least 120 degrees of active shoulder flexion for improved function reaching overhead.    Baseline 86    Time 6    Period Weeks    Status On-going      PT LONG TERM GOAL #3   Title Patient will be able to demonstrate at least 120 degrees of active shoulder abduction for improved function reaching.    Baseline 84    Time 6    Period Weeks    Status On-going      PT LONG TERM GOAL #4   Status On-going      PT LONG TERM GOAL #5   Status On-going                   Plan - 09/14/21 0843     Clinical Impression Statement Patient was progressed with multiple interventions for improved shoulder mobility and strength. She required minimal cuing with today's interventions for proper pacing to facilitate proper biomechanics as she attempted to rush through these interventions. She experienced a moderate increase in right shoulder discomfort with the upper extremity ranger in standing. However, this discomfort was reduced once the intervention was stopped. She was attempted to be introduced to resisted internal rotation in neutral, but she was unable to perform this intervention due to weakness. She reported that her shoulder felt good upon the conclusion of treatment. She continues to require skilled physical therapy to safely return to her prior level of function.    Personal Factors and Comorbidities  Other;Time since onset of injury/illness/exacerbation    Examination-Activity Limitations Bathing;Reach Overhead;Carry;Dressing;Hygiene/Grooming;Lift    Examination-Participation Restrictions Community Activity;Shop;Yard Work    Stability/Clinical Decision Making Stable/Uncomplicated    Rehab Potential Good    PT Frequency 2x / week    PT Duration 6 weeks    PT Treatment/Interventions Electrical Stimulation;Neuromuscular re-education;Therapeutic exercise;Therapeutic activities;Patient/family education;Manual techniques;Energy conservation;Passive range of motion;Scar  mobilization;Taping;Vasopneumatic Device    PT Next Visit Plan DOS: 07/20/21; see protocol    PT Home Exercise Plan scapular retraction, bicep curl, and towel squeeze    Consulted and Agree with Plan of Care Patient             Patient will benefit from skilled therapeutic intervention in order to improve the following deficits and impairments:  Decreased range of motion, Impaired UE functional use, Pain, Decreased strength  Visit Diagnosis: Stiffness of right shoulder, not elsewhere classified  Muscle weakness (generalized)     Problem List Patient Active Problem List   Diagnosis Date Noted   Positive urine culture 09/05/2021   Dysuria 08/29/2021   Elevated alkaline phosphatase level 02/02/2021   S/P reverse total shoulder arthroplasty, left 11/12/2019   AKI (acute kidney injury) ([place]) 10/05/2019   Anterior dislocation of left shoulder 09/26/2019   Hx of colonic polyps 09/04/2019   Chest pain 09/04/2019   Closed fracture of shaft of right ulna 07/08/2019   Fracture of shaft of ulna 07/03/2019   Benzodiazepine dependence ([place]) 04/13/2019   Controlled substance agreement signed 04/13/2019   Urticaria 09/08/2018   S/p reverse total shoulder arthroplasty 07/17/2018   History of colon cancer 06/12/2018   History of colonic polyps 06/12/2018   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   Gastroesophageal reflux disease 06/12/2018   Rotator cuff tear arthropathy 05/28/2018   Hypertension 04/08/2018   Chronic diarrhea 06/26/2017   Iron deficiency anemia 06/26/2017   GAD (generalized anxiety disorder) 08/28/2016   Vitamin D deficiency 08/28/2016   Insomnia 08/28/2016   Immunosuppression ([place]) 12/02/2015   Liver transplant recipient [facility]) 11/30/2015   Seizures ([place]) 09/27/2015   Hx of liver transplant ([place]) 12/08/2014   Colon cancer ([place]) 12/08/2014   Adenocarcinoma in a polyp ([place]) 09/17/2014   Portal vein thrombosis 11/19/2013   Hepatic encephalopathy 10/26/2013   Personal history of  colonic polyps 12/27/2012   Hypothyroidism 03/24/2012   Ventral hernia, recurrent 03/24/2012   Hernia 09/04/2011   Other secondary pulmonary hypertension ([place]) 08/16/2011    Darlin Coco, PT 09/14/2021, 10:16 AM  [facility] [place], [place], [address] Phone: [phone]   Fax:  [phone]  Name: Cassidy Barber MRN: [id number] Date of Birth: [birth year]

## 2021-09-18 ENCOUNTER — Other Ambulatory Visit: Payer: Self-pay

## 2021-09-18 ENCOUNTER — Ambulatory Visit: Payer: Medicare Other

## 2021-09-18 DIAGNOSIS — M25611 Stiffness of right shoulder, not elsewhere classified: Secondary | ICD-10-CM | POA: Diagnosis not present

## 2021-09-18 DIAGNOSIS — M6281 Muscle weakness (generalized): Secondary | ICD-10-CM | POA: Diagnosis not present

## 2021-09-18 LAB — METHYLMALONIC ACID, SERUM: Methylmalonic Acid, Quantitative: 201 nmol/L (ref 0–378)

## 2021-09-18 NOTE — Therapy (Signed)
[facility] [place], [place], [address] Phone: [phone]   Fax:  [phone]  Physical Therapy Treatment  Patient Details  Name: Cassidy Barber MRN: [id number] Date of Birth: 03/15/1951 Referring Provider (PT): Supple   Encounter Date: 09/18/2021   PT End of Session - 09/18/21 0905     Visit Number 8    Number of Visits 13    Date for PT Re-Evaluation 10/04/21    PT Start Time 0900    PT Stop Time 0945    PT Time Calculation (min) 45 min    Activity Tolerance Patient tolerated treatment well    Behavior During Therapy [facility] for tasks assessed/performed             Past Medical History:  Diagnosis Date   Abdominal wall hernia    Incarcerated status post surgical repair 2019 - [facility]   Anemia of chronic disease    Atypical nevus 01/21/2018   atypical neoplasm- Left scalp-ant (txpbx + MOHS), atypical neoplasm- Left scalp post- (txpbx + MOHS)   Basal cell carcinoma    Colon cancer ([place])    Colon surgery 2005 and 2012   History of pulmonary hypertension    Pre liver transplant   Hypertension    Hypothyroidism    Lynch syndrome    Osteopenia    Primary biliary cirrhosis ([place])    Status post liver transplantation - follows at [facility] (squamous cell carcinoma) of skin 02/23/2020   Right Upper Chest(moderate) (MOH's)   SCCA (squamous cell carcinoma) of skin 04/07/2020   Left Top Leg (Keratoacanthoma) treatment after biopsy   SCCA (squamous cell carcinoma) of skin 04/07/2020   Left Foot Dorsal (in situ) treatment after biopsy   SCCA (squamous cell carcinoma) of skin 03/27/2021   Right Upper Back (Keratoacanthoma) (excision) (clear)   SCCA (squamous cell carcinoma) of skin 06/13/2021   Right Shoulder - anterior (moderately differentiated) (tx p bx)   SCCA (squamous cell carcinoma) of skin 06/13/2021   Right Thigh - anterior (well differentiated) (tx p bx)   SCCA (squamous cell carcinoma) of skin 06/13/2021    Right Lower Leg - anterior (well differentiated) (tx p bx)   Squamous cell carcinoma of skin 04/22/2018   KA-Right mid chest (txpbx), KA-left elbow crease (txpbx), insitu-Right mid chest inf. (exc)   Squamous cell carcinoma of skin 05/20/2018   well diff-Left upper shin (txpbx), well diff-Right lower forearm (txpbx), well diff-Right upper shin (txpbx)   Squamous cell carcinoma of skin 06/11/2018   Scc + margin-Right mid chest inferior    Squamous cell carcinoma of skin 06/27/2018   well diff-Left mid thigh(txpbx), well diff-Left inner thigh (txpbx),insitu-Right cheek (txpbx),well diff-right inner heel (txpbx)   Squamous cell carcinoma of skin 09/16/2018   well diff-left shoulder (txpbx), well diff-Right chin (txpbx), well diff-right chest lateral (CX35FU)   Squamous cell carcinoma of skin 10/01/2018   Right outer lower shin (Txpbx)   Squamous cell carcinoma of skin 04/03/2019   well diff-Right center chest (MOHS), in situ-Right ear   Squamous cell carcinoma of skin 08/05/2019   in situ-Left calf (txpbx), in situ-left bicep (txpbx), well diff-Left chest,inf(txpbx), in situ-Right chest inf-(txpbx)   Squamous cell carcinoma of skin 11/19/2019   KA-left top leg (txpbx), modify-Riight forehead-(mohs), in situ-right hand (txpbx), in situ-Right forearm (txpbx), well diff-Right chest (txpbx), well diff-chin (txpbx)   Squamous cell carcinoma of skin 01/06/2020   KA- Left top leg   Squamous cell carcinoma of  skin 05/12/2013   bowens-middle of chest (CX35FU)   Squamous cell carcinoma of skin 05/18/2015   well diff-Left upper arm (CX35FU + Exc),KA-right chest(txpbx), in situ-Left shin (txpbx), well diff-Right cheek (CX35FU), KA-Left post scalp (CX35FU)   Squamous cell carcinoma of skin 08/09/2015   KA-Left post scalp ((MOHS), in situ- mid chest (Txpbx +exc), in situ-Left upper arm inferior (txpbx)   Squamous cell carcinoma of skin 10/13/2015   Left upper arm-clear   Squamous cell carcinoma of skin  03/09/2016   mod diff-mid chest (txpbx+ exc), mod diff-Right chest (txpbx+exc), well diff-right cheek-(txpbx),well diff-Left hand-(txpbx), in situ-Left upper arm (txpbx), well diff-Right cheek -(txpbx), well diff-Right crease arm (txpbx)    Squamous cell carcinoma of skin 05/24/2016   well diff-Right nasal crease-(MOHS)   Squamous cell carcinoma of skin 08/02/2016   KA-Left chest med (txpbx)   Squamous cell carcinoma of skin 08/30/2016   in situ-Left outer zygoma (txpbx)   Squamous cell carcinoma of skin 12/06/2016   well diff-Left chest sup, Left shoudler, insitu- right post scalp   Squamous cell carcinoma of skin 02/14/2017   well diff-Left forearm (EXC),in situ-RIght ant neck   Squamous cell carcinoma of skin 06/14/2017   in situ-Right forearm (txpbx), in situ-Right chest (txpbx), well diff-left chest (txpbx), well diff-anterior neck- (txpbx)   Squamous cell carcinoma of skin 08/07/2017   well diff-Left upper shoulder (txpbx), sup and invasive-Left temple (txpbx), well diff-Right upper shin (txpbx), in situ-Right clavicle (txpbx)   Squamous cell carcinoma of skin 10/17/2017   well diff-ant. neck (MOHS), in situ-Right chest, inf (txpbx)   Squamous cell carcinoma of skin 01/21/2018   well diff- Right chest,ulnar (txpbx), well diff- right upper chest (txpbx), in situ-Right ant. crown (txpbx)   Squamous cell carcinoma of skin 08/02/2020   well diff-left lower leg-inferior (Txpbx)   Squamous cell carcinoma of skin 08/02/2020   well diff-right lower leg-mid (txpbx)   Squamous cell carcinoma of skin 08/02/2020   well diff-left chest upper   Squamous cell carcinoma of skin 08/02/2020   well diff-mid parietal scalp (MOHS)   Squamous cell carcinoma of skin 08/02/2020   well diff-right foot inner(txpbx)   Squamous cell carcinoma of skin 08/02/2020   well diff- left lower leg medial (txpbx)   Squamous cell carcinoma of skin 08/02/2020   well diff-left lower leg anterior (txpbx)   Squamous  cell carcinoma of skin 08/02/2020   well diff-left lower leg medial (txpbx)   Squamous cell carcinoma of skin 08/02/2020   well diff-right forearm-posterior (txpbx)    Past Surgical History:  Procedure Laterality Date   ABDOMINAL HERNIA REPAIR     Patient's states that she has had 8- 9 hernia surgeries   ABDOMINAL HYSTERECTOMY     BIOPSY  02/15/2021   Procedure: BIOPSY;  Surgeon: Rogene Houston, MD;  [facility];  Service: Endoscopy;;   CATARACT EXTRACTION W/PHACO Right 01/15/2020   Procedure: CATARACT EXTRACTION PHACO AND INTRAOCULAR LENS PLACEMENT ([place]);  Surgeon: Baruch Goldmann, MD;  Location: [facility];  Service: Ophthalmology;  Laterality: Right;  CDE: 7.89   CATARACT EXTRACTION W/PHACO Left 01/29/2020   Procedure: CATARACT EXTRACTION PHACO AND INTRAOCULAR LENS PLACEMENT ([facility]) (CDE: 6.33);  Surgeon: Baruch Goldmann, MD;  Location: [facility];  Service: Ophthalmology;  Laterality: Left;   CHOLECYSTECTOMY  2007   COLON SURGERY  2008   Done at [facility]   COLONOSCOPY     Done at [facility]   ESOPHAGOGASTRODUODENOSCOPY N/A 08/21/2018   Procedure: ESOPHAGOGASTRODUODENOSCOPY (EGD);  Surgeon: Rogene Houston, MD;  [facility];  Service: Endoscopy;  Laterality: N/A;   ESOPHAGOGASTRODUODENOSCOPY (EGD) WITH PROPOFOL N/A 12/16/2019   Procedure: ESOPHAGOGASTRODUODENOSCOPY (EGD) WITH PROPOFOL;  Surgeon: Rogene Houston, MD;  [facility];  Service: Endoscopy;  Laterality: N/A;   EYE SURGERY     lasix   FLEXIBLE SIGMOIDOSCOPY N/A 10/20/2015   Procedure: FLEXIBLE SIGMOIDOSCOPY;  Surgeon: Rogene Houston, MD;  [facility];  Service: Endoscopy;  Laterality: N/A;  39 - Dr Laural Golden has meeting until 1:00   FLEXIBLE SIGMOIDOSCOPY N/A 07/11/2016   Procedure: FLEXIBLE SIGMOIDOSCOPY;  Surgeon: Rogene Houston, MD;  [facility];  Service: Endoscopy;  Laterality: N/A;  [facility] N/A 08/09/2017   Procedure: FLEXIBLE SIGMOIDOSCOPY;  Surgeon:  Rogene Houston, MD;  [facility];  Service: Endoscopy;  Laterality: N/A;  1:00   FLEXIBLE SIGMOIDOSCOPY N/A 08/21/2018   Procedure: FLEXIBLE SIGMOIDOSCOPY;  Surgeon: Rogene Houston, MD;  [facility];  Service: Endoscopy;  Laterality: N/A;   FLEXIBLE SIGMOIDOSCOPY N/A 12/16/2019   Procedure: FLEXIBLE SIGMOIDOSCOPY wirh Propofol;  Surgeon: Rogene Houston, MD;  [facility];  Service: Endoscopy;  Laterality: N/A;  7:30   FLEXIBLE SIGMOIDOSCOPY N/A 02/15/2021   Procedure: FLEXIBLE SIGMOIDOSCOPY WITH PROPOFOL;  Surgeon: Rogene Houston, MD;  [facility];  Service: Endoscopy;  Laterality: N/A;  am   FRACTURE SURGERY     right wrist metal plate   LIVER TRANSPLANT  11/19/2013   POLYPECTOMY  08/09/2017   Procedure: POLYPECTOMY;  Surgeon: Rogene Houston, MD;  [facility];  Service: Endoscopy;;  colon small bowel   REVERSE SHOULDER ARTHROPLASTY Left 07/17/2018   Procedure: LEFT REVERSE SHOULDER ARTHROPLASTY;  Surgeon: Justice Britain, MD;  Location: [facility];  Service: Orthopedics;  Laterality: Left;  157min   REVERSE SHOULDER ARTHROPLASTY Right 07/20/2021   Procedure: REVERSE SHOULDER ARTHROPLASTY;  Surgeon: Justice Britain, MD;  [facility];  Service: Orthopedics;  Laterality: Right;   SHOULDER CLOSED REDUCTION Left 09/27/2019   Procedure: CLOSED REDUCTION SHOULDER;  Surgeon: Paralee Cancel, MD;  [facility];  Service: Orthopedics;  Laterality: Left;   SPLENECTOMY  2006   TOTAL SHOULDER REVISION Left 11/12/2019   Procedure: Revision Left Reverse Shoulder Arthroplasty with poly exchange SDD;  Surgeon: Justice Britain, MD;  Location[facility];  Service: Orthopedics;  Laterality: Left;  127min -SDDC   TYMPANOSTOMY TUBE PLACEMENT     UPPER GASTROINTESTINAL ENDOSCOPY     Done at [facility]    There were no vitals filed for this visit.   Subjective Assessment - 09/18/21 0904     Subjective Pt denies any pain upon arriving for today's treatment  session. Pt reports that she is feeling very tired due to needing iron infusion late this week.    Pertinent History Reverse TSA (L: 2021) (R: 07/20/21), history of skin cancer    Limitations Lifting;Writing    Patient Stated Goals make crafts, reaching overhead,    Currently in Pain? No/denies    Pain Onset More than a month ago                               [facility] Adult PT Treatment/Exercise - 09/18/21 0001       Exercises   Exercises Shoulder      Shoulder Exercises: Supine   Protraction AROM;Right;15 reps  Flexion AROM;Right;10 reps   5 sec hold   Other Supine Exercises ABCs      Shoulder Exercises: Seated   Retraction AROM;Both;20 reps   Cues for posture   Other Seated Exercises Shoulder shrugs 20 reps B      Shoulder Exercises: Sidelying   External Rotation AROM;Right;15 reps   Cues to keep elbow at side   ABduction AROM;Right;15 reps   Cues for thumb up     Shoulder Exercises: Pulleys   Flexion 3 minutes    ABduction 3 minutes      Shoulder Exercises: ROM/Strengthening   Nustep Lvl 5 x 15 mins    Ranger Standing; 2 mins flex, 2 mins left/right    Wall Pushups Other (comment)   Wall taps to opposite UE; 2 mins     Shoulder Exercises: Isometric Strengthening   Flexion --   Supin     Shoulder Exercises: Stretch   Corner Stretch 2 reps;30 seconds   Cues for technique                      PT Short Term Goals - 09/12/21 0841       PT SHORT TERM GOAL #1   Title Patient will be able to demonstrate at least 60 degrees of shoulder flexion for improved function reaching.    Baseline 86    Time 3    Period Weeks    Status Achieved    Target Date 09/13/21      PT SHORT TERM GOAL #2   Title Patient will be able to demonstrate at least 60 degrees of shoulder abduction for improved function reaching for her daily activities.    Baseline 84    Time 3    Period Weeks    Status Achieved    Target Date 09/13/21               PT  Long Term Goals - 09/12/21 0843       PT LONG TERM GOAL #1   Title Patient will be independent with her HEP.    Time 6    Period Weeks    Status On-going      PT LONG TERM GOAL #2   Title Patient will be able to demonstrate at least 120 degrees of active shoulder flexion for improved function reaching overhead.    Baseline 86    Time 6    Period Weeks    Status On-going      PT LONG TERM GOAL #3   Title Patient will be able to demonstrate at least 120 degrees of active shoulder abduction for improved function reaching.    Baseline 84    Time 6    Period Weeks    Status On-going      PT LONG TERM GOAL #4   Status On-going      PT LONG TERM GOAL #5   Status On-going                   Plan - 09/18/21 0906     Clinical Impression Statement Pt progressed with AROM of right shoulder per protocol.  Pt requiring cues for technique and posture with various exercises during today's session.  Pt with discomfort during ranger in standing, but able to tolerate 2 mins of activity.  Introduced Warehouse manager to patient for anterior shoulder tightness.  Pt requiring cues to remain withing pain free ROM and to illicit stretch, but not pain.  Pt tends to push too hard with stretches.  Pt denied any pain at completion of today's treatment session, but does endorse fatigue.    Personal Factors and Comorbidities Other;Time since onset of injury/illness/exacerbation    Examination-Activity Limitations Bathing;Reach Overhead;Carry;Dressing;Hygiene/Grooming;Lift    Examination-Participation Restrictions Community Activity;Shop;Yard Work    Stability/Clinical Decision Making Stable/Uncomplicated    Rehab Potential Good    PT Frequency 2x / week    PT Duration 6 weeks    PT Treatment/Interventions Electrical Stimulation;Neuromuscular re-education;Therapeutic exercise;Therapeutic activities;Patient/family education;Manual techniques;Energy conservation;Passive range of motion;Scar  mobilization;Taping;Vasopneumatic Device    PT Next Visit Plan DOS: 07/20/21; see protocol    PT Home Exercise Plan scapular retraction, bicep curl, and towel squeeze    Consulted and Agree with Plan of Care Patient             Patient will benefit from skilled therapeutic intervention in order to improve the following deficits and impairments:  Decreased range of motion, Impaired UE functional use, Pain, Decreased strength  Visit Diagnosis: Stiffness of right shoulder, not elsewhere classified     Problem List Patient Active Problem List   Diagnosis Date Noted   Positive urine culture 09/05/2021   Dysuria 08/29/2021   Elevated alkaline phosphatase level 02/02/2021   S/P reverse total shoulder arthroplasty, left 11/12/2019   AKI (acute kidney injury) ([place]) 10/05/2019   Anterior dislocation of left shoulder 09/26/2019   Hx of colonic polyps 09/04/2019   Chest pain 09/04/2019   Closed fracture of shaft of right ulna 07/08/2019   Fracture of shaft of ulna 07/03/2019   Benzodiazepine dependence ([place]) 04/13/2019   Controlled substance agreement signed 04/13/2019   Urticaria 09/08/2018   S/p reverse total shoulder arthroplasty 07/17/2018   History of colon cancer 06/12/2018   History of colonic polyps 06/12/2018   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   Gastroesophageal reflux disease 06/12/2018   Rotator cuff tear arthropathy 05/28/2018   Hypertension 04/08/2018   Chronic diarrhea 06/26/2017   Iron deficiency anemia 06/26/2017   GAD (generalized anxiety disorder) 08/28/2016   Vitamin D deficiency 08/28/2016   Insomnia 08/28/2016   Immunosuppression ([place]) 12/02/2015   Liver transplant recipient [facility]) 11/30/2015   Seizures ([place]) 09/27/2015   Hx of liver transplant ([place]) 12/08/2014   Colon cancer ([place]) 12/08/2014   Adenocarcinoma in a polyp ([place]) 09/17/2014   Portal vein thrombosis 11/19/2013   Hepatic encephalopathy 10/26/2013   Personal history of colonic polyps 12/27/2012    Hypothyroidism 03/24/2012   Ventral hernia, recurrent 03/24/2012   Hernia 09/04/2011   Other secondary pulmonary hypertension ([place]) 08/16/2011    Kathrynn Ducking, PTA 09/18/2021, 9:54 AM  [facility] [place], [place], [address] Phone: [phone]   Fax:  [phone]  Name: Cassidy Barber MRN: [id number] Date of Birth: Aug 09, 1951

## 2021-09-19 NOTE — Progress Notes (Signed)
Virtual Visit via Telephone Note [facility]  I connected with Cassidy Barber  on 09/20/21  at  12:21 PM by telephone and verified that I am speaking with the correct person using two identifiers.  Location: Patient: Home Provider: [facility]   I discussed the limitations, risks, security and privacy concerns of performing an evaluation and management service by telephone and the availability of in person appointments. I also discussed with the patient that there may be a patient responsible charge related to this service. The patient expressed understanding and agreed to proceed.   HISTORY OF PRESENT ILLNESS: [name] follows at our clinic for her iron deficiency anemia.  She was last seen in office by Tarri Abernethy PA-C on 05/12/2021.  She received IV iron (Venofer 300 mg x 3) from 05/26/2021 through 06/05/2021).  At today's visit, she reports feeling fairly well apart from ongoing fatigue.  She denies any recent hospitalizations, surgeries, or changes in her baseline health status.  She initially felt better after IV iron, but has begun to feel increasingly fatigued again, reports that she usually feels this way before she needs IV iron.  She is still able to complete necessary tasks, but feels much more worn out than before.  She continues to have chronically dark stool, but she denies any obvious melena or hematochezia.  No epistaxis or other sources of blood loss. No chest pain, dyspnea on exertion, lightheadedness, or syncope. She has chronic intermittent diarrhea related to her history of colectomy with reanastomosis.  She reports 50% energy and 100% appetite.  She is maintaining stable weight at this time.    OBSERVATIONS/OBJECTIVE: Review of Systems  Constitutional:  Positive for malaise/fatigue. Negative for chills, diaphoresis, fever and weight loss.  Respiratory:  Negative for cough and shortness of breath.   Cardiovascular:  Negative  for chest pain and palpitations.  Gastrointestinal:  Positive for diarrhea. Negative for abdominal pain, blood in stool, melena, nausea and vomiting.  Neurological:  Negative for dizziness and headaches.  Psychiatric/Behavioral:  The patient has insomnia.     PHYSICAL EXAM (per limitations of virtual telephone visit): The patient is alert and oriented x 3, exhibiting adequate mentation, good mood, and ability to speak in full sentences and execute sound judgement.   ASSESSMENT & PLAN: 1.  Iron deficiency anemia: - Unable to tolerate oral iron. - Secondary to history of colon cancer s/p abdominal colectomy.  Malabsorption. - She had a sigmoidoscopy/EGD on 12/16/2019 which appeared normal.  Gastric antral vascular ectasia without bleeding.  Healed esophagitis. - Sigmoidoscopy (02/15/2021): Two nonbleeding erosions at ileocolonic anastomosis, small external hemorrhoids - SPEP negative in 2019 - She last received IV iron (Venofer 300 mg x 3) from 05/26/2021 through 06/05/2021). - Currently symptomatic with fatigue - Denies any current signs or symptoms of bleeding such as epistaxis, rectal hemorrhage, or melena. - Most recent labs (09/14/2021): Hgb 9.2/MCV 92.9, platelets 401, ferritin 22, iron saturation 5% with serum iron 16.  B12 and methylmalonic acid normal. - PLAN: Recommend another course of IV Venofer 300 mg x 4 doses, RTC in 3 months for labs and follow-up   2.  Liver Transplant: -Had this completed at [facility] in January 2015.  -She is on immunosuppression with everolimus.   3.  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 -She is followed by dermatology and has had several  areas treated recently with topical creams.   4.  History of colon cancer: -Status post abdominal colectomy on 09/17/2011 with simultaneous total abdominal hysterectomy and bilateral  salpingo-oophorectomy. -Found to have Lynch syndrome. -Per recommendations she is to have an EGD every 2 to 3 years and annual colonoscopy for review of her residual colon tissue at the anastomosis. - Sigmoidoscopy (02/15/2021): Two nonbleeding erosions at ileocolonic anastomosis, small external hemorrhoids   FOLLOW UP INSTRUCTIONS: IV Venofer 300 mg x 4 doses Labs in 3 months Phone visit after labs    I discussed the assessment and treatment plan with the patient. The patient was provided an opportunity to ask questions and all were answered. The patient agreed with the plan and demonstrated an understanding of the instructions.   The patient was advised to call back or seek an in-person evaluation if the symptoms worsen or if the condition fails to improve as anticipated.  I provided 21 minutes of non-face-to-face time during this encounter.   Harriett Rush, PA-C 09/20/21 1:34 PM

## 2021-09-20 ENCOUNTER — Other Ambulatory Visit: Payer: Self-pay

## 2021-09-20 ENCOUNTER — Inpatient Hospital Stay (HOSPITAL_BASED_OUTPATIENT_CLINIC_OR_DEPARTMENT_OTHER): Payer: Medicare Other | Admitting: Physician Assistant

## 2021-09-20 DIAGNOSIS — C4440 Unspecified malignant neoplasm of skin of scalp and neck: Secondary | ICD-10-CM

## 2021-09-20 DIAGNOSIS — D509 Iron deficiency anemia, unspecified: Secondary | ICD-10-CM | POA: Diagnosis not present

## 2021-09-20 DIAGNOSIS — Z944 Liver transplant status: Secondary | ICD-10-CM

## 2021-09-20 DIAGNOSIS — Z8500 Personal history of malignant neoplasm of unspecified digestive organ: Secondary | ICD-10-CM | POA: Diagnosis not present

## 2021-09-25 ENCOUNTER — Other Ambulatory Visit: Payer: Self-pay

## 2021-09-25 ENCOUNTER — Ambulatory Visit: Payer: Medicare Other

## 2021-09-25 DIAGNOSIS — M25611 Stiffness of right shoulder, not elsewhere classified: Secondary | ICD-10-CM | POA: Diagnosis not present

## 2021-09-25 DIAGNOSIS — M6281 Muscle weakness (generalized): Secondary | ICD-10-CM

## 2021-09-25 NOTE — Therapy (Signed)
[facility] [place], [place], [address] Phone: [phone]   Fax:  [phone]  Physical Therapy Treatment  Patient Details  Name: Cassidy Barber MRN: [id number] Date of Birth: October 22, 1951 Referring Provider (PT): Supple   Encounter Date: 09/25/2021   PT End of Session - 09/25/21 0904     Visit Number 9    Number of Visits 13    Date for PT Re-Evaluation 10/04/21    PT Start Time 0900    Activity Tolerance Patient tolerated treatment well    Behavior During Therapy [facility] for tasks assessed/performed             Past Medical History:  Diagnosis Date   Abdominal wall hernia    Incarcerated status post surgical repair 2019 - [facility]   Anemia of chronic disease    Atypical nevus 01/21/2018   atypical neoplasm- Left scalp-ant (txpbx + MOHS), atypical neoplasm- Left scalp post- (txpbx + MOHS)   Basal cell carcinoma    Colon cancer ([place])    Colon surgery 2005 and 2012   History of pulmonary hypertension    Pre liver transplant   Hypertension    Hypothyroidism    Lynch syndrome    Osteopenia    Primary biliary cirrhosis ([place])    Status post liver transplantation - follows at [facility] (squamous cell carcinoma) of skin 02/23/2020   Right Upper Chest(moderate) (MOH's)   SCCA (squamous cell carcinoma) of skin 04/07/2020   Left Top Leg (Keratoacanthoma) treatment after biopsy   SCCA (squamous cell carcinoma) of skin 04/07/2020   Left Foot Dorsal (in situ) treatment after biopsy   SCCA (squamous cell carcinoma) of skin 03/27/2021   Right Upper Back (Keratoacanthoma) (excision) (clear)   SCCA (squamous cell carcinoma) of skin 06/13/2021   Right Shoulder - anterior (moderately differentiated) (tx p bx)   SCCA (squamous cell carcinoma) of skin 06/13/2021   Right Thigh - anterior (well differentiated) (tx p bx)   SCCA (squamous cell carcinoma) of skin 06/13/2021   Right Lower Leg - anterior (well differentiated) (tx p bx)    Squamous cell carcinoma of skin 04/22/2018   KA-Right mid chest (txpbx), KA-left elbow crease (txpbx), insitu-Right mid chest inf. (exc)   Squamous cell carcinoma of skin 05/20/2018   well diff-Left upper shin (txpbx), well diff-Right lower forearm (txpbx), well diff-Right upper shin (txpbx)   Squamous cell carcinoma of skin 06/11/2018   Scc + margin-Right mid chest inferior    Squamous cell carcinoma of skin 06/27/2018   well diff-Left mid thigh(txpbx), well diff-Left inner thigh (txpbx),insitu-Right cheek (txpbx),well diff-right inner heel (txpbx)   Squamous cell carcinoma of skin 09/16/2018   well diff-left shoulder (txpbx), well diff-Right chin (txpbx), well diff-right chest lateral (CX35FU)   Squamous cell carcinoma of skin 10/01/2018   Right outer lower shin (Txpbx)   Squamous cell carcinoma of skin 04/03/2019   well diff-Right center chest (MOHS), in situ-Right ear   Squamous cell carcinoma of skin 08/05/2019   in situ-Left calf (txpbx), in situ-left bicep (txpbx), well diff-Left chest,inf(txpbx), in situ-Right chest inf-(txpbx)   Squamous cell carcinoma of skin 11/19/2019   KA-left top leg (txpbx), modify-Riight forehead-(mohs), in situ-right hand (txpbx), in situ-Right forearm (txpbx), well diff-Right chest (txpbx), well diff-chin (txpbx)   Squamous cell carcinoma of skin 01/06/2020   KA- Left top leg   Squamous cell carcinoma of skin 05/12/2013   bowens-middle of chest (CX35FU)   Squamous cell carcinoma of skin 05/18/2015  well diff-Left upper arm (CX35FU + Exc),KA-right chest(txpbx), in situ-Left shin (txpbx), well diff-Right cheek (CX35FU), KA-Left post scalp (CX35FU)   Squamous cell carcinoma of skin 08/09/2015   KA-Left post scalp ((MOHS), in situ- mid chest (Txpbx +exc), in situ-Left upper arm inferior (txpbx)   Squamous cell carcinoma of skin 10/13/2015   Left upper arm-clear   Squamous cell carcinoma of skin 03/09/2016   mod diff-mid chest (txpbx+ exc), mod  diff-Right chest (txpbx+exc), well diff-right cheek-(txpbx),well diff-Left hand-(txpbx), in situ-Left upper arm (txpbx), well diff-Right cheek -(txpbx), well diff-Right crease arm (txpbx)    Squamous cell carcinoma of skin 05/24/2016   well diff-Right nasal crease-(MOHS)   Squamous cell carcinoma of skin 08/02/2016   KA-Left chest med (txpbx)   Squamous cell carcinoma of skin 08/30/2016   in situ-Left outer zygoma (txpbx)   Squamous cell carcinoma of skin 12/06/2016   well diff-Left chest sup, Left shoudler, insitu- right post scalp   Squamous cell carcinoma of skin 02/14/2017   well diff-Left forearm (EXC),in situ-RIght ant neck   Squamous cell carcinoma of skin 06/14/2017   in situ-Right forearm (txpbx), in situ-Right chest (txpbx), well diff-left chest (txpbx), well diff-anterior neck- (txpbx)   Squamous cell carcinoma of skin 08/07/2017   well diff-Left upper shoulder (txpbx), sup and invasive-Left temple (txpbx), well diff-Right upper shin (txpbx), in situ-Right clavicle (txpbx)   Squamous cell carcinoma of skin 10/17/2017   well diff-ant. neck (MOHS), in situ-Right chest, inf (txpbx)   Squamous cell carcinoma of skin 01/21/2018   well diff- Right chest,ulnar (txpbx), well diff- right upper chest (txpbx), in situ-Right ant. crown (txpbx)   Squamous cell carcinoma of skin 08/02/2020   well diff-left lower leg-inferior (Txpbx)   Squamous cell carcinoma of skin 08/02/2020   well diff-right lower leg-mid (txpbx)   Squamous cell carcinoma of skin 08/02/2020   well diff-left chest upper   Squamous cell carcinoma of skin 08/02/2020   well diff-mid parietal scalp (MOHS)   Squamous cell carcinoma of skin 08/02/2020   well diff-right foot inner(txpbx)   Squamous cell carcinoma of skin 08/02/2020   well diff- left lower leg medial (txpbx)   Squamous cell carcinoma of skin 08/02/2020   well diff-left lower leg anterior (txpbx)   Squamous cell carcinoma of skin 08/02/2020   well diff-left  lower leg medial (txpbx)   Squamous cell carcinoma of skin 08/02/2020   well diff-right forearm-posterior (txpbx)    Past Surgical History:  Procedure Laterality Date   ABDOMINAL HERNIA REPAIR     Patient's states that she has had 8- 9 hernia surgeries   ABDOMINAL HYSTERECTOMY     BIOPSY  02/15/2021   Procedure: BIOPSY;  Surgeon: Rogene Houston, MD;  [facility];  Service: Endoscopy;;   CATARACT EXTRACTION W/PHACO Right 01/15/2020   Procedure: CATARACT EXTRACTION PHACO AND INTRAOCULAR LENS PLACEMENT ([place]);  Surgeon: Baruch Goldmann, MD;  Location: [facility];  Service: Ophthalmology;  Laterality: Right;  CDE: 7.89   CATARACT EXTRACTION W/PHACO Left 01/29/2020   Procedure: CATARACT EXTRACTION PHACO AND INTRAOCULAR LENS PLACEMENT (IOC) (CDE: 6.33);  Surgeon: Baruch Goldmann, MD;  Location: [facility];  Service: Ophthalmology;  Laterality: Left;   CHOLECYSTECTOMY  2007   COLON SURGERY  2008   Done at [facility]   COLONOSCOPY     Done at [facility]   ESOPHAGOGASTRODUODENOSCOPY N/A 08/21/2018   Procedure: ESOPHAGOGASTRODUODENOSCOPY (EGD);  Surgeon: Rogene Houston, MD;  [facility];  Service: Endoscopy;  Laterality: N/A;  ESOPHAGOGASTRODUODENOSCOPY (EGD) WITH PROPOFOL N/A 12/16/2019   Procedure: ESOPHAGOGASTRODUODENOSCOPY (EGD) WITH PROPOFOL;  Surgeon: Rogene Houston, MD;  [facility];  Service: Endoscopy;  Laterality: N/A;   EYE SURGERY     lasix   FLEXIBLE SIGMOIDOSCOPY N/A 10/20/2015   Procedure: FLEXIBLE SIGMOIDOSCOPY;  Surgeon: Rogene Houston, MD;  [facility];  Service: Endoscopy;  Laterality: N/A;  16 - Dr Laural Golden has meeting until 1:00   FLEXIBLE SIGMOIDOSCOPY N/A 07/11/2016   Procedure: FLEXIBLE SIGMOIDOSCOPY;  Surgeon: Rogene Houston, MD;  [facility];  Service: Endoscopy;  Laterality: N/A;  [place] N/A 08/09/2017   Procedure: FLEXIBLE SIGMOIDOSCOPY;  Surgeon: Rogene Houston, MD;  [facility];   Service: Endoscopy;  Laterality: N/A;  1:00   FLEXIBLE SIGMOIDOSCOPY N/A 08/21/2018   Procedure: FLEXIBLE SIGMOIDOSCOPY;  Surgeon: Rogene Houston, MD;  [facility];  Service: Endoscopy;  Laterality: N/A;   FLEXIBLE SIGMOIDOSCOPY N/A 12/16/2019   Procedure: FLEXIBLE SIGMOIDOSCOPY wirh Propofol;  Surgeon: Rogene Houston, MD;  [facility];  Service: Endoscopy;  Laterality: N/A;  7:30   FLEXIBLE SIGMOIDOSCOPY N/A 02/15/2021   Procedure: FLEXIBLE SIGMOIDOSCOPY WITH PROPOFOL;  Surgeon: Rogene Houston, MD;  [facility];  Service: Endoscopy;  Laterality: N/A;  am   FRACTURE SURGERY     right wrist metal plate   LIVER TRANSPLANT  11/19/2013   POLYPECTOMY  08/09/2017   Procedure: POLYPECTOMY;  Surgeon: Rogene Houston, MD;  [facility];  Service: Endoscopy;;  colon small bowel   REVERSE SHOULDER ARTHROPLASTY Left 07/17/2018   Procedure: LEFT REVERSE SHOULDER ARTHROPLASTY;  Surgeon: Justice Britain, MD;  Location: [place] [facility];  Service: Orthopedics;  Laterality: Left;  140min   REVERSE SHOULDER ARTHROPLASTY Right 07/20/2021   Procedure: REVERSE SHOULDER ARTHROPLASTY;  Surgeon: Justice Britain, MD;  Location[facility];  Service: Orthopedics;  Laterality: Right;   SHOULDER CLOSED REDUCTION Left 09/27/2019   Procedure: CLOSED REDUCTION SHOULDER;  Surgeon: Paralee Cancel, MD;  Location[facility];  Service: Orthopedics;  Laterality: Left;   SPLENECTOMY  2006   TOTAL SHOULDER REVISION Left 11/12/2019   Procedure: Revision Left Reverse Shoulder Arthroplasty with poly exchange SDD;  Surgeon: Justice Britain, MD;  Location[facility];  Service: Orthopedics;  Laterality: Left;  192min -SDDC   TYMPANOSTOMY TUBE PLACEMENT     UPPER GASTROINTESTINAL ENDOSCOPY     Done at [facility]    There were no vitals filed for this visit.   Subjective Assessment - 09/25/21 0903     Subjective Pt denies any pain upon arriving for today's treatment session. Pt denies any pain today, but endorses  fatigue.  Pt scheduled to get iron infusion on Wednesday.    Pertinent History Reverse TSA (L: 2021) (R: 07/20/21), history of skin cancer    Limitations Lifting;Writing    Patient Stated Goals make crafts, reaching overhead,    Currently in Pain? No/denies    Pain Onset More than a month ago                               [facility] Adult PT Treatment/Exercise - 09/25/21 0001       Shoulder Exercises: Supine   Protraction AROM;Right;15 reps    Flexion AROM;Right   Cues to stay within pain free ROM   Other Supine Exercises ABCS x 1      Shoulder Exercises:  Sidelying   External Rotation AROM;Right;15 reps    Theraband Level (Shoulder External Rotation) --   Cues for elbow to side   ABduction AROM;Right;15 reps   Cues for thumb up     Shoulder Exercises: Pulleys   Flexion 3 minutes    ABduction 3 minutes      Shoulder Exercises: ROM/Strengthening   Nustep Lvl 5 x 15 mins    Ranger Standing; 2 mins flex, 2 mins left/right      Manual Therapy   Manual Therapy Passive ROM;Soft tissue mobilization    Manual therapy comments Right anterior deloid and bicep to decrease pain and tone    Passive ROM PROM to R shoulder into flexion and ER with gentle holds at end range                       PT Short Term Goals - 09/12/21 0841       PT SHORT TERM GOAL #1   Title Patient will be able to demonstrate at least 60 degrees of shoulder flexion for improved function reaching.    Baseline 86    Time 3    Period Weeks    Status Achieved    Target Date 09/13/21      PT SHORT TERM GOAL #2   Title Patient will be able to demonstrate at least 60 degrees of shoulder abduction for improved function reaching for her daily activities.    Baseline 84    Time 3    Period Weeks    Status Achieved    Target Date 09/13/21               PT Long Term Goals - 09/12/21 0843       PT LONG TERM GOAL #1   Title Patient will be independent with her HEP.    Time 6     Period Weeks    Status On-going      PT LONG TERM GOAL #2   Title Patient will be able to demonstrate at least 120 degrees of active shoulder flexion for improved function reaching overhead.    Baseline 86    Time 6    Period Weeks    Status On-going      PT LONG TERM GOAL #3   Title Patient will be able to demonstrate at least 120 degrees of active shoulder abduction for improved function reaching.    Baseline 84    Time 6    Period Weeks    Status On-going      PT LONG TERM GOAL #4   Status On-going      PT LONG TERM GOAL #5   Status On-going                   Plan - 09/25/21 0905     Clinical Impression Statement Pt arrives for today's treatment session denying any pain.  Pt continues to report fatigue and is getting a iron infusion on Wednesday.  Pt with discomfort during left/right motion on ranger in standing, but able to tolerate flexion without issue.  Pt able to tolerate increase in reps with supine AROM of right shoulder.  PROM performed to right shoulder with gentle hold at end range.  Pt encouraged to continue wall walks and stretch at home as previously instructed.  Pt also instructed to continue isometric flexion and ER to assit with right shoulder weakness.  STW/M performed to right anterior deltoid and bicep for  pain and tone.  Pt reported mild tenderness at completion of treatment, but denied any real pain.    Personal Factors and Comorbidities Other;Time since onset of injury/illness/exacerbation    Examination-Activity Limitations Bathing;Reach Overhead;Carry;Dressing;Hygiene/Grooming;Lift    Examination-Participation Restrictions Community Activity;Shop;Yard Work    Stability/Clinical Decision Making Stable/Uncomplicated    Rehab Potential Good    PT Frequency 2x / week    PT Duration 6 weeks    PT Treatment/Interventions Electrical Stimulation;Neuromuscular re-education;Therapeutic exercise;Therapeutic activities;Patient/family education;Manual  techniques;Energy conservation;Passive range of motion;Scar mobilization;Taping;Vasopneumatic Device    PT Next Visit Plan DOS: 07/20/21; see protocol    PT Home Exercise Plan scapular retraction, bicep curl, and towel squeeze    Consulted and Agree with Plan of Care Patient             Patient will benefit from skilled therapeutic intervention in order to improve the following deficits and impairments:  Decreased range of motion, Impaired UE functional use, Pain, Decreased strength  Visit Diagnosis: Stiffness of right shoulder, not elsewhere classified  Muscle weakness (generalized)     Problem List Patient Active Problem List   Diagnosis Date Noted   Positive urine culture 09/05/2021   Dysuria 08/29/2021   Elevated alkaline phosphatase level 02/02/2021   S/P reverse total shoulder arthroplasty, left 11/12/2019   AKI (acute kidney injury) ([place]) 10/05/2019   Anterior dislocation of left shoulder 09/26/2019   Hx of colonic polyps 09/04/2019   Chest pain 09/04/2019   Closed fracture of shaft of right ulna 07/08/2019   Fracture of shaft of ulna 07/03/2019   Benzodiazepine dependence ([place]) 04/13/2019   Controlled substance agreement signed 04/13/2019   Urticaria 09/08/2018   S/p reverse total shoulder arthroplasty 07/17/2018   History of colon cancer 06/12/2018   History of colonic polyps 06/12/2018   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   Gastroesophageal reflux disease 06/12/2018   Rotator cuff tear arthropathy 05/28/2018   Hypertension 04/08/2018   Chronic diarrhea 06/26/2017   Iron deficiency anemia 06/26/2017   GAD (generalized anxiety disorder) 08/28/2016   Vitamin D deficiency 08/28/2016   Insomnia 08/28/2016   Immunosuppression ([place]) 12/02/2015   Liver transplant recipient [facility]) 11/30/2015   Seizures ([place]) 09/27/2015   Hx of liver transplant ([place]) 12/08/2014   Colon cancer ([place]) 12/08/2014   Adenocarcinoma in a polyp ([place]) 09/17/2014   Portal vein thrombosis 11/19/2013    Hepatic encephalopathy 10/26/2013   Personal history of colonic polyps 12/27/2012   Hypothyroidism 03/24/2012   Ventral hernia, recurrent 03/24/2012   Hernia 09/04/2011   Other secondary pulmonary hypertension ([place]) 08/16/2011    Kathrynn Ducking, PTA 09/25/2021, 10:06 AM  [facility] [place], [place], [address] Phone: [phone]   Fax:  [phone]  Name: Cassidy Barber MRN: [id number] Date of Birth: 19-Feb-1951

## 2021-09-27 ENCOUNTER — Encounter (HOSPITAL_COMMUNITY): Payer: Self-pay

## 2021-09-27 ENCOUNTER — Other Ambulatory Visit: Payer: Self-pay

## 2021-09-27 ENCOUNTER — Inpatient Hospital Stay (HOSPITAL_COMMUNITY): Payer: Medicare Other

## 2021-09-27 ENCOUNTER — Encounter: Payer: Medicare Other | Admitting: Physician Assistant

## 2021-09-27 VITALS — BP 134/85 | HR 65 | Temp 97.8°F | Resp 18

## 2021-09-27 DIAGNOSIS — Z944 Liver transplant status: Secondary | ICD-10-CM | POA: Diagnosis not present

## 2021-09-27 DIAGNOSIS — D508 Other iron deficiency anemias: Secondary | ICD-10-CM

## 2021-09-27 DIAGNOSIS — D509 Iron deficiency anemia, unspecified: Secondary | ICD-10-CM | POA: Diagnosis not present

## 2021-09-27 DIAGNOSIS — Z79899 Other long term (current) drug therapy: Secondary | ICD-10-CM | POA: Diagnosis not present

## 2021-09-27 MED ORDER — SODIUM CHLORIDE 0.9 % IV SOLN
Freq: Once | INTRAVENOUS | Status: AC
Start: 2021-09-27 — End: 2021-09-27

## 2021-09-27 MED ORDER — ACETAMINOPHEN 325 MG PO TABS: 650.0000 mg | ORAL_TABLET | Freq: Once | ORAL | Status: DC

## 2021-09-27 MED ORDER — LORATADINE 10 MG PO TABS
10.0000 mg | ORAL_TABLET | Freq: Once | ORAL | Status: AC
  Administered 2021-09-27: 10 mg via ORAL
  Filled 2021-09-27: qty 1

## 2021-09-27 MED ORDER — SODIUM CHLORIDE 0.9 % IV SOLN
300.0000 mg | Freq: Once | INTRAVENOUS | Status: AC
  Administered 2021-09-27: 300 mg via INTRAVENOUS
  Filled 2021-09-27: qty 300

## 2021-09-27 NOTE — Progress Notes (Signed)
Pt here for venofer 300 mg today.  Pt took tylenol at 0730 before arriving to clinic today.  No complaints or pain to note today.  Tolerated treatment well today without incidence.  Stable during and after treatment.  Pt refused to wait 30 minute wait time per protocol.  AVS reviewed. Vital signs stale prior to discharge.  Discharged in stable condition ambulatory.

## 2021-09-27 NOTE — Patient Instructions (Signed)
[facility]  Discharge Instructions: Thank you for choosing [facility] to provide your oncology and hematology care.  If you have a lab appointment with the [facility], please come in thru the Main Entrance and check in at the main information desk.  Wear comfortable clothing and clothing appropriate for easy access to any Portacath or PICC line.   We strive to give you quality time with your provider. You may need to reschedule your appointment if you arrive late (15 or more minutes).  Arriving late affects you and other patients whose appointments are after yours.  Also, if you miss three or more appointments without notifying the office, you may be dismissed from the clinic at the provider's discretion.      For prescription refill requests, have your pharmacy contact our office and allow 72 hours for refills to be completed.    Today you received the following chemotherapy and/or immunotherapy agents venofer 300 mg      Follow up as scheduled To help prevent nausea and vomiting after your treatment, we encourage you to take your nausea medication as directed.  BELOW ARE SYMPTOMS THAT SHOULD BE REPORTED IMMEDIATELY: *FEVER GREATER THAN 100.4 F (38 C) OR HIGHER *CHILLS OR SWEATING *NAUSEA AND VOMITING THAT IS NOT CONTROLLED WITH YOUR NAUSEA MEDICATION *UNUSUAL SHORTNESS OF BREATH *UNUSUAL BRUISING OR BLEEDING *URINARY PROBLEMS (pain or burning when urinating, or frequent urination) *BOWEL PROBLEMS (unusual diarrhea, constipation, pain near the anus) TENDERNESS IN MOUTH AND THROAT WITH OR WITHOUT PRESENCE OF ULCERS (sore throat, sores in mouth, or a toothache) UNUSUAL RASH, SWELLING OR PAIN  UNUSUAL VAGINAL DISCHARGE OR ITCHING   Items with * indicate a potential emergency and should be followed up as soon as possible or go to the Emergency Department if any problems should occur.  Please show the CHEMOTHERAPY ALERT CARD or IMMUNOTHERAPY ALERT CARD at  check-in to the Emergency Department and triage nurse.  Should you have questions after your visit or need to cancel or reschedule your appointment, please contact [facility] [phone]  and follow the prompts.  Office hours are 8:00 a.m. to 4:30 p.m. Monday - Friday. Please note that voicemails left after 4:00 p.m. may not be returned until the following business day.  We are closed weekends and major holidays. You have access to a nurse at all times for urgent questions. Please call the main number to the clinic [phone] and follow the prompts.  For any non-urgent questions, you may also contact your provider using MyChart. We now offer e-Visits for anyone 60 and older to request care online for non-urgent symptoms. For details visit [email or web address].   Also download the MyChart app! Go to the app store, search "MyChart", open the app, select [facility], and log in with your MyChart username and password.  Due to Covid, a mask is required upon entering the hospital/clinic. If you do not have a mask, one will be given to you upon arrival. For doctor visits, patients may have 1 support person aged 46 or older with them. For treatment visits, patients cannot have  Iron Sucrose Injection What is this medication? IRON SUCROSE (EYE ern SOO krose) treats low levels of iron (iron deficiency anemia) in people with kidney disease. Iron is a mineral that plays an important role in making red blood cells, which carry oxygen from your lungs to the rest of your body. This medicine may be used for other purposes; ask your health care provider  or pharmacist if you have questions. COMMON BRAND NAME(S): Venofer What should I tell my care team before I take this medication? They need to know if you have any of these conditions: Anemia not caused by low iron levels Heart disease High levels of iron in the blood Kidney disease Liver disease An unusual or allergic reaction to iron, other  medications, foods, dyes, or preservatives Pregnant or trying to get pregnant Breast-feeding How should I use this medication? This medication is for infusion into a vein. It is given in a hospital or clinic setting. Talk to your care team about the use of this medication in children. While this medication may be prescribed for children as young as 2 years for selected conditions, precautions do apply. Overdosage: If you think you have taken too much of this medicine contact a poison control center or emergency room at once. NOTE: This medicine is only for you. Do not share this medicine with others. What if I miss a dose? It is important not to miss your dose. Call your care team if you are unable to keep an appointment. What may interact with this medication? Do not take this medication with any of the following: Deferoxamine Dimercaprol Other iron products This medication may also interact with the following: Chloramphenicol Deferasirox This list may not describe all possible interactions. Give your health care provider a list of all the medicines, herbs, non-prescription drugs, or dietary supplements you use. Also tell them if you smoke, drink alcohol, or use illegal drugs. Some items may interact with your medicine. What should I watch for while using this medication? Visit your care team regularly. Tell your care team if your symptoms do not start to get better or if they get worse. You may need blood work done while you are taking this medication. You may need to follow a special diet. Talk to your care team. Foods that contain iron include: whole grains/cereals, dried fruits, beans, or peas, leafy green vegetables, and organ meats (liver, kidney). What side effects may I notice from receiving this medication? Side effects that you should report to your care team as soon as possible: Allergic reactions--skin rash, itching, hives, swelling of the face, lips, tongue, or throat Low blood  pressure--dizziness, feeling faint or lightheaded, blurry vision Shortness of breath Side effects that usually do not require medical attention (report to your care team if they continue or are bothersome): Flushing Headache Joint pain Muscle pain Nausea Pain, redness, or irritation at injection site This list may not describe all possible side effects. Call your doctor for medical advice about side effects. You may report side effects to FDA at 1-800-FDA-1088. Where should I keep my medication? This medication is given in a hospital or clinic and will not be stored at home. NOTE: This sheet is a summary. It may not cover all possible information. If you have questions about this medicine, talk to your doctor, pharmacist, or health care provider.  2022 Elsevier/Gold Standard (2021-03-24 00:00:00)

## 2021-09-28 ENCOUNTER — Ambulatory Visit: Payer: Medicare Other

## 2021-09-28 ENCOUNTER — Other Ambulatory Visit: Payer: Self-pay

## 2021-09-28 DIAGNOSIS — M6281 Muscle weakness (generalized): Secondary | ICD-10-CM | POA: Diagnosis not present

## 2021-09-28 DIAGNOSIS — M25611 Stiffness of right shoulder, not elsewhere classified: Secondary | ICD-10-CM

## 2021-09-28 NOTE — Therapy (Addendum)
[facility] [place], [place], [address] Phone: [phone]   Fax:  [phone]  Physical Therapy Treatment  Patient Details  Name: Cassidy Barber MRN: [id number] Date of Birth: 1951-08-04 Referring Provider (PT): Supple   Encounter Date: 09/28/2021   PT End of Session - 09/28/21 0910     Visit Number 10    Number of Visits 13    Date for PT Re-Evaluation 10/04/21    PT Start Time 0900    PT Stop Time 0947    PT Time Calculation (min) 47 min    Activity Tolerance Patient tolerated treatment well    Behavior During Therapy [facility] for tasks assessed/performed             Past Medical History:  Diagnosis Date   Abdominal wall hernia    Incarcerated status post surgical repair 2019 - [facility]   Anemia of chronic disease    Atypical nevus 01/21/2018   atypical neoplasm- Left scalp-ant (txpbx + MOHS), atypical neoplasm- Left scalp post- (txpbx + MOHS)   Basal cell carcinoma    Colon cancer ([place])    Colon surgery 2005 and 2012   History of pulmonary hypertension    Pre liver transplant   Hypertension    Hypothyroidism    Lynch syndrome    Osteopenia    Primary biliary cirrhosis ([place])    Status post liver transplantation - follows at [place] (squamous cell carcinoma) of skin 02/23/2020   Right Upper Chest(moderate) (MOH's)   SCCA (squamous cell carcinoma) of skin 04/07/2020   Left Top Leg (Keratoacanthoma) treatment after biopsy   SCCA (squamous cell carcinoma) of skin 04/07/2020   Left Foot Dorsal (in situ) treatment after biopsy   SCCA (squamous cell carcinoma) of skin 03/27/2021   Right Upper Back (Keratoacanthoma) (excision) (clear)   SCCA (squamous cell carcinoma) of skin 06/13/2021   Right Shoulder - anterior (moderately differentiated) (tx p bx)   SCCA (squamous cell carcinoma) of skin 06/13/2021   Right Thigh - anterior (well differentiated) (tx p bx)   SCCA (squamous cell carcinoma) of skin 06/13/2021    Right Lower Leg - anterior (well differentiated) (tx p bx)   Squamous cell carcinoma of skin 04/22/2018   KA-Right mid chest (txpbx), KA-left elbow crease (txpbx), insitu-Right mid chest inf. (exc)   Squamous cell carcinoma of skin 05/20/2018   well diff-Left upper shin (txpbx), well diff-Right lower forearm (txpbx), well diff-Right upper shin (txpbx)   Squamous cell carcinoma of skin 06/11/2018   Scc + margin-Right mid chest inferior    Squamous cell carcinoma of skin 06/27/2018   well diff-Left mid thigh(txpbx), well diff-Left inner thigh (txpbx),insitu-Right cheek (txpbx),well diff-right inner heel (txpbx)   Squamous cell carcinoma of skin 09/16/2018   well diff-left shoulder (txpbx), well diff-Right chin (txpbx), well diff-right chest lateral (CX35FU)   Squamous cell carcinoma of skin 10/01/2018   Right outer lower shin (Txpbx)   Squamous cell carcinoma of skin 04/03/2019   well diff-Right center chest (MOHS), in situ-Right ear   Squamous cell carcinoma of skin 08/05/2019   in situ-Left calf (txpbx), in situ-left bicep (txpbx), well diff-Left chest,inf(txpbx), in situ-Right chest inf-(txpbx)   Squamous cell carcinoma of skin 11/19/2019   KA-left top leg (txpbx), modify-Riight forehead-(mohs), in situ-right hand (txpbx), in situ-Right forearm (txpbx), well diff-Right chest (txpbx), well diff-chin (txpbx)   Squamous cell carcinoma of skin 01/06/2020   KA- Left top leg   Squamous cell carcinoma of  skin 05/12/2013   bowens-middle of chest (CX35FU)   Squamous cell carcinoma of skin 05/18/2015   well diff-Left upper arm (CX35FU + Exc),KA-right chest(txpbx), in situ-Left shin (txpbx), well diff-Right cheek (CX35FU), KA-Left post scalp (CX35FU)   Squamous cell carcinoma of skin 08/09/2015   KA-Left post scalp ((MOHS), in situ- mid chest (Txpbx +exc), in situ-Left upper arm inferior (txpbx)   Squamous cell carcinoma of skin 10/13/2015   Left upper arm-clear   Squamous cell carcinoma of  skin 03/09/2016   mod diff-mid chest (txpbx+ exc), mod diff-Right chest (txpbx+exc), well diff-right cheek-(txpbx),well diff-Left hand-(txpbx), in situ-Left upper arm (txpbx), well diff-Right cheek -(txpbx), well diff-Right crease arm (txpbx)    Squamous cell carcinoma of skin 05/24/2016   well diff-Right nasal crease-(MOHS)   Squamous cell carcinoma of skin 08/02/2016   KA-Left chest med (txpbx)   Squamous cell carcinoma of skin 08/30/2016   in situ-Left outer zygoma (txpbx)   Squamous cell carcinoma of skin 12/06/2016   well diff-Left chest sup, Left shoudler, insitu- right post scalp   Squamous cell carcinoma of skin 02/14/2017   well diff-Left forearm (EXC),in situ-RIght ant neck   Squamous cell carcinoma of skin 06/14/2017   in situ-Right forearm (txpbx), in situ-Right chest (txpbx), well diff-left chest (txpbx), well diff-anterior neck- (txpbx)   Squamous cell carcinoma of skin 08/07/2017   well diff-Left upper shoulder (txpbx), sup and invasive-Left temple (txpbx), well diff-Right upper shin (txpbx), in situ-Right clavicle (txpbx)   Squamous cell carcinoma of skin 10/17/2017   well diff-ant. neck (MOHS), in situ-Right chest, inf (txpbx)   Squamous cell carcinoma of skin 01/21/2018   well diff- Right chest,ulnar (txpbx), well diff- right upper chest (txpbx), in situ-Right ant. crown (txpbx)   Squamous cell carcinoma of skin 08/02/2020   well diff-left lower leg-inferior (Txpbx)   Squamous cell carcinoma of skin 08/02/2020   well diff-right lower leg-mid (txpbx)   Squamous cell carcinoma of skin 08/02/2020   well diff-left chest upper   Squamous cell carcinoma of skin 08/02/2020   well diff-mid parietal scalp (MOHS)   Squamous cell carcinoma of skin 08/02/2020   well diff-right foot inner(txpbx)   Squamous cell carcinoma of skin 08/02/2020   well diff- left lower leg medial (txpbx)   Squamous cell carcinoma of skin 08/02/2020   well diff-left lower leg anterior (txpbx)    Squamous cell carcinoma of skin 08/02/2020   well diff-left lower leg medial (txpbx)   Squamous cell carcinoma of skin 08/02/2020   well diff-right forearm-posterior (txpbx)    Past Surgical History:  Procedure Laterality Date   ABDOMINAL HERNIA REPAIR     Patient's states that she has had 8- 9 hernia surgeries   ABDOMINAL HYSTERECTOMY     BIOPSY  02/15/2021   Procedure: BIOPSY;  Surgeon: Rogene Houston, MD;  [facility];  Service: Endoscopy;;   CATARACT EXTRACTION W/PHACO Right 01/15/2020   Procedure: CATARACT EXTRACTION PHACO AND INTRAOCULAR LENS PLACEMENT ([place]);  Surgeon: Baruch Goldmann, MD;  Location: [facility];  Service: Ophthalmology;  Laterality: Right;  CDE: 7.89   CATARACT EXTRACTION W/PHACO Left 01/29/2020   Procedure: CATARACT EXTRACTION PHACO AND INTRAOCULAR LENS PLACEMENT (IOC) (CDE: 6.33);  Surgeon: Baruch Goldmann, MD;  Location: [facility];  Service: Ophthalmology;  Laterality: Left;   CHOLECYSTECTOMY  2007   COLON SURGERY  2008   [facility] at [facility]   COLONOSCOPY     [facility] at [facility]   ESOPHAGOGASTRODUODENOSCOPY N/A 08/21/2018   Procedure: ESOPHAGOGASTRODUODENOSCOPY (EGD);  Surgeon: Rogene Houston, MD;  [facility];  Service: Endoscopy;  Laterality: N/A;   ESOPHAGOGASTRODUODENOSCOPY (EGD) WITH PROPOFOL N/A 12/16/2019   Procedure: ESOPHAGOGASTRODUODENOSCOPY (EGD) WITH PROPOFOL;  Surgeon: Rogene Houston, MD;  [facility];  Service: Endoscopy;  Laterality: N/A;   EYE SURGERY     lasix   FLEXIBLE SIGMOIDOSCOPY N/A 10/20/2015   Procedure: FLEXIBLE SIGMOIDOSCOPY;  Surgeon: Rogene Houston, MD;  [facility];  Service: Endoscopy;  Laterality: N/A;  84 - Dr Laural Golden has meeting until 1:00   FLEXIBLE SIGMOIDOSCOPY N/A 07/11/2016   Procedure: FLEXIBLE SIGMOIDOSCOPY;  Surgeon: Rogene Houston, MD;  [facility];  Service: Endoscopy;  Laterality: N/A;  [place] N/A 08/09/2017   Procedure: FLEXIBLE SIGMOIDOSCOPY;   Surgeon: Rogene Houston, MD;  [facility];  Service: Endoscopy;  Laterality: N/A;  1:00   FLEXIBLE SIGMOIDOSCOPY N/A 08/21/2018   Procedure: FLEXIBLE SIGMOIDOSCOPY;  Surgeon: Rogene Houston, MD;  [facility];  Service: Endoscopy;  Laterality: N/A;   FLEXIBLE SIGMOIDOSCOPY N/A 12/16/2019   Procedure: FLEXIBLE SIGMOIDOSCOPY wirh Propofol;  Surgeon: Rogene Houston, MD;  [facility];  Service: Endoscopy;  Laterality: N/A;  7:30   FLEXIBLE SIGMOIDOSCOPY N/A 02/15/2021   Procedure: FLEXIBLE SIGMOIDOSCOPY WITH PROPOFOL;  Surgeon: Rogene Houston, MD;  [facility];  Service: Endoscopy;  Laterality: N/A;  am   FRACTURE SURGERY     right wrist metal plate   LIVER TRANSPLANT  11/19/2013   POLYPECTOMY  08/09/2017   Procedure: POLYPECTOMY;  Surgeon: Rogene Houston, MD;  [facility];  Service: Endoscopy;;  colon small bowel   REVERSE SHOULDER ARTHROPLASTY Left 07/17/2018   Procedure: LEFT REVERSE SHOULDER ARTHROPLASTY;  Surgeon: Justice Britain, MD;  Location: [place];  Service: Orthopedics;  Laterality: Left;  166min   REVERSE SHOULDER ARTHROPLASTY Right 07/20/2021   Procedure: REVERSE SHOULDER ARTHROPLASTY;  Surgeon: Justice Britain, MD;  [facility];  Service: Orthopedics;  Laterality: Right;   SHOULDER CLOSED REDUCTION Left 09/27/2019   Procedure: CLOSED REDUCTION SHOULDER;  Surgeon: Paralee Cancel, MD;  Location[facility];  Service: Orthopedics;  Laterality: Left;   SPLENECTOMY  2006   TOTAL SHOULDER REVISION Left 11/12/2019   Procedure: Revision Left Reverse Shoulder Arthroplasty with poly exchange SDD;  Surgeon: Justice Britain, MD;  [facility];  Service: Orthopedics;  Laterality: Left;  125min -SDDC   TYMPANOSTOMY TUBE PLACEMENT     UPPER GASTROINTESTINAL ENDOSCOPY     Done at [facility]    There were no vitals filed for this visit.   Subjective Assessment - 09/28/21 0907     Subjective Pt denies any pain upon arriving for today's  treatment session. Pt reports 6/10 right shoulder pain upon arriving for today's treatment session.  Pt reports increased soreness since STW during last treatment session.    Pertinent History Reverse TSA (L: 2021) (R: 07/20/21), history of skin cancer    Limitations Lifting;Writing    Patient Stated Goals make crafts, reaching overhead,    Currently in Pain? Yes    Pain Score 6     Pain Location Shoulder    Pain Onset More than a month ago                [facility] PT Assessment - 09/28/21 0001       Observation/Other Assessments   Focus on Therapeutic Outcomes (FOTO)  Completed; 66 11/17 10th visit  [facility] Adult PT Treatment/Exercise - 09/28/21 0001       Shoulder Exercises: Seated   Extension Both;Strengthening;15 reps;Theraband    Theraband Level (Shoulder Extension) Level 1 (Yellow)    Extension Limitations Cues for posture    Row Strengthening;15 reps;Theraband    Theraband Level (Shoulder Row) Level 1 (Yellow)    Row Limitations Cues for posture    External Rotation Strengthening;15 reps;Theraband    Theraband Level (Shoulder External Rotation) Level 1 (Yellow)    External Rotation Limitations Isometric with walk outs    Internal Rotation Strengthening;15 reps;Theraband    Theraband Level (Shoulder Internal Rotation) Level 1 (Yellow)    Internal Rotation Limitations Isometric walkouts; cues for technique      Shoulder Exercises: Standing   Other Standing Exercises Finger ladder   10 reps     Shoulder Exercises: Pulleys   Flexion 3 minutes    ABduction 3 minutes      Shoulder Exercises: ROM/Strengthening   Nustep Lvl 5 x 15 mins                       PT Short Term Goals - 09/12/21 0841       PT SHORT TERM GOAL #1   Title Patient will be able to demonstrate at least 60 degrees of shoulder flexion for improved function reaching.    Baseline 86    Time 3    Period Weeks    Status Achieved    Target Date  09/13/21      PT SHORT TERM GOAL #2   Title Patient will be able to demonstrate at least 60 degrees of shoulder abduction for improved function reaching for her daily activities.    Baseline 84    Time 3    Period Weeks    Status Achieved    Target Date 09/13/21               PT Long Term Goals - 09/28/21 0910       PT LONG TERM GOAL #1   Title Patient will be independent with her HEP.    Time 6    Period Weeks    Status Achieved      PT LONG TERM GOAL #2   Title Patient will be able to demonstrate at least 120 degrees of active shoulder flexion for improved function reaching overhead.    Baseline 86; 138 degrees 11/17    Time 6    Period Weeks    Status Achieved      PT LONG TERM GOAL #3   Title Patient will be able to demonstrate at least 120 degrees of active shoulder abduction for improved function reaching.    Baseline 84; 125 degrees 11/17    Time 6    Period Weeks    Status Achieved      PT LONG TERM GOAL #4   Status --      PT LONG TERM GOAL #5   Status --                   Plan - 09/28/21 0910     Clinical Impression Statement Pt arrives for today's treatment session reporting 6/10 right shoulder pain/soreness.  Pt states that she has been sore since the last treatment due to STW.  Pt is making good progress towards her goals.  Pt able to perform 138 degrees of active flexion and 125 degrees of active abduction with her right arm.  Pt's  FOTO score increased to 66 during today's treatment session.  Pt instructed in seated theraband exercises, requiring mod cues for proper technique and posture.  Pt's main complaint is her weakness in ER/IR directions.  Pt would continue to benefit from current plan of care to address limitation and assist with return to prior level of function.    Personal Factors and Comorbidities Other;Time since onset of injury/illness/exacerbation    Examination-Activity Limitations Bathing;Reach  Overhead;Carry;Dressing;Hygiene/Grooming;Lift    Examination-Participation Restrictions Community Activity;Shop;Yard Work    Stability/Clinical Decision Making Stable/Uncomplicated    Rehab Potential Good    PT Frequency 2x / week    PT Duration 6 weeks    PT Treatment/Interventions Electrical Stimulation;Neuromuscular re-education;Therapeutic exercise;Therapeutic activities;Patient/family education;Manual techniques;Energy conservation;Passive range of motion;Scar mobilization;Taping;Vasopneumatic Device    PT Next Visit Plan DOS: 07/20/21; see protocol    PT Home Exercise Plan scapular retraction, bicep curl, and towel squeeze    Consulted and Agree with Plan of Care Patient             Patient will benefit from skilled therapeutic intervention in order to improve the following deficits and impairments:  Decreased range of motion, Impaired UE functional use, Pain, Decreased strength  Visit Diagnosis: Stiffness of right shoulder, not elsewhere classified  Muscle weakness (generalized)     Problem List Patient Active Problem List   Diagnosis Date Noted   Positive urine culture 09/05/2021   Dysuria 08/29/2021   Elevated alkaline phosphatase level 02/02/2021   S/P reverse total shoulder arthroplasty, left 11/12/2019   AKI (acute kidney injury) ([place]) 10/05/2019   Anterior dislocation of left shoulder 09/26/2019   Hx of colonic polyps 09/04/2019   Chest pain 09/04/2019   Closed fracture of shaft of right ulna 07/08/2019   Fracture of shaft of ulna 07/03/2019   Benzodiazepine dependence ([place]) 04/13/2019   Controlled substance agreement signed 04/13/2019   Urticaria 09/08/2018   S/p reverse total shoulder arthroplasty 07/17/2018   History of colon cancer 06/12/2018   History of colonic polyps 06/12/2018   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   Gastroesophageal reflux disease 06/12/2018   Rotator cuff tear arthropathy 05/28/2018   Hypertension 04/08/2018   Chronic diarrhea 06/26/2017    Iron deficiency anemia 06/26/2017   GAD (generalized anxiety disorder) 08/28/2016   Vitamin D deficiency 08/28/2016   Insomnia 08/28/2016   Immunosuppression ([place]) 12/02/2015   Liver transplant recipient [facility]) 11/30/2015   Seizures ([place]) 09/27/2015   Hx of liver transplant ([place]) 12/08/2014   Colon cancer ([place]) 12/08/2014   Adenocarcinoma in a polyp ([place]) 09/17/2014   Portal vein thrombosis 11/19/2013   Hepatic encephalopathy 10/26/2013   Personal history of colonic polyps 12/27/2012   Hypothyroidism 03/24/2012   Ventral hernia, recurrent 03/24/2012   Hernia 09/04/2011   Other secondary pulmonary hypertension ([place]) 08/16/2011    Kathrynn Ducking, PTA 09/28/2021, 9:54 AM  [facility] [place], [place], [address] Phone: [phone]   Fax:  [phone]  Name: Cassidy Barber MRN: [id number] Date of Birth: 10/14/1951  Progress Note Reporting Period 08/23/21 to 09/28/21  See note below for Objective Data and Assessment of Progress/Goals.    Patient is making good progress with her goals with skilled physical therapy. She was able to meet all of her initial goals, but notes that she continues to have significant difficulty with any resisted/weighted internal and external rotation. Recommend that she continue with her current plan of care to address her remaining impairments to return to her prior level  of function.   Jacqulynn Cadet, PT, DPT

## 2021-10-02 ENCOUNTER — Other Ambulatory Visit: Payer: Self-pay

## 2021-10-02 ENCOUNTER — Ambulatory Visit: Payer: Medicare Other

## 2021-10-02 DIAGNOSIS — M25611 Stiffness of right shoulder, not elsewhere classified: Secondary | ICD-10-CM

## 2021-10-02 DIAGNOSIS — M6281 Muscle weakness (generalized): Secondary | ICD-10-CM | POA: Diagnosis not present

## 2021-10-02 NOTE — Therapy (Addendum)
[facility] [place], [place], [address] Phone: [phone]   Fax:  [phone]  Physical Therapy Treatment  Patient Details  Name: DONDRA RHETT MRN: [id number] Date of Birth: Mar 18, 1951 Referring Provider (PT): Supple   Encounter Date: 10/02/2021   PT End of Session - 10/02/21 0911     Visit Number 11    Number of Visits 13    Date for PT Re-Evaluation 10/04/21    PT Start Time 0900    PT Stop Time 0946    PT Time Calculation (min) 46 min    Activity Tolerance Patient tolerated treatment well    Behavior During Therapy [facility] for tasks assessed/performed             Past Medical History:  Diagnosis Date   Abdominal wall hernia    Incarcerated status post surgical repair 2019 - [facility]   Anemia of chronic disease    Atypical nevus 01/21/2018   atypical neoplasm- Left scalp-ant (txpbx + MOHS), atypical neoplasm- Left scalp post- (txpbx + MOHS)   Basal cell carcinoma    Colon cancer ([place])    Colon surgery 2005 and 2012   History of pulmonary hypertension    Pre liver transplant   Hypertension    Hypothyroidism    Lynch syndrome    Osteopenia    Primary biliary cirrhosis ([place])    Status post liver transplantation - follows at [place] (squamous cell carcinoma) of skin 02/23/2020   Right Upper Chest(moderate) (MOH's)   SCCA (squamous cell carcinoma) of skin 04/07/2020   Left Top Leg (Keratoacanthoma) treatment after biopsy   SCCA (squamous cell carcinoma) of skin 04/07/2020   Left Foot Dorsal (in situ) treatment after biopsy   SCCA (squamous cell carcinoma) of skin 03/27/2021   Right Upper Back (Keratoacanthoma) (excision) (clear)   SCCA (squamous cell carcinoma) of skin 06/13/2021   Right Shoulder - anterior (moderately differentiated) (tx p bx)   SCCA (squamous cell carcinoma) of skin 06/13/2021   Right Thigh - anterior (well differentiated) (tx p bx)   SCCA (squamous cell carcinoma) of skin 06/13/2021    Right Lower Leg - anterior (well differentiated) (tx p bx)   Squamous cell carcinoma of skin 04/22/2018   KA-Right mid chest (txpbx), KA-left elbow crease (txpbx), insitu-Right mid chest inf. (exc)   Squamous cell carcinoma of skin 05/20/2018   well diff-Left upper shin (txpbx), well diff-Right lower forearm (txpbx), well diff-Right upper shin (txpbx)   Squamous cell carcinoma of skin 06/11/2018   Scc + margin-Right mid chest inferior    Squamous cell carcinoma of skin 06/27/2018   well diff-Left mid thigh(txpbx), well diff-Left inner thigh (txpbx),insitu-Right cheek (txpbx),well diff-right inner heel (txpbx)   Squamous cell carcinoma of skin 09/16/2018   well diff-left shoulder (txpbx), well diff-Right chin (txpbx), well diff-right chest lateral (CX35FU)   Squamous cell carcinoma of skin 10/01/2018   Right outer lower shin (Txpbx)   Squamous cell carcinoma of skin 04/03/2019   well diff-Right center chest (MOHS), in situ-Right ear   Squamous cell carcinoma of skin 08/05/2019   in situ-Left calf (txpbx), in situ-left bicep (txpbx), well diff-Left chest,inf(txpbx), in situ-Right chest inf-(txpbx)   Squamous cell carcinoma of skin 11/19/2019   KA-left top leg (txpbx), modify-Riight forehead-(mohs), in situ-right hand (txpbx), in situ-Right forearm (txpbx), well diff-Right chest (txpbx), well diff-chin (txpbx)   Squamous cell carcinoma of skin 01/06/2020   KA- Left top leg   Squamous cell carcinoma of  skin 05/12/2013   bowens-middle of chest (CX35FU)   Squamous cell carcinoma of skin 05/18/2015   well diff-Left upper arm (CX35FU + Exc),KA-right chest(txpbx), in situ-Left shin (txpbx), well diff-Right cheek (CX35FU), KA-Left post scalp (CX35FU)   Squamous cell carcinoma of skin 08/09/2015   KA-Left post scalp ((MOHS), in situ- mid chest (Txpbx +exc), in situ-Left upper arm inferior (txpbx)   Squamous cell carcinoma of skin 10/13/2015   Left upper arm-clear   Squamous cell carcinoma of  skin 03/09/2016   mod diff-mid chest (txpbx+ exc), mod diff-Right chest (txpbx+exc), well diff-right cheek-(txpbx),well diff-Left hand-(txpbx), in situ-Left upper arm (txpbx), well diff-Right cheek -(txpbx), well diff-Right crease arm (txpbx)    Squamous cell carcinoma of skin 05/24/2016   well diff-Right nasal crease-(MOHS)   Squamous cell carcinoma of skin 08/02/2016   KA-Left chest med (txpbx)   Squamous cell carcinoma of skin 08/30/2016   in situ-Left outer zygoma (txpbx)   Squamous cell carcinoma of skin 12/06/2016   well diff-Left chest sup, Left shoudler, insitu- right post scalp   Squamous cell carcinoma of skin 02/14/2017   well diff-Left forearm (EXC),in situ-RIght ant neck   Squamous cell carcinoma of skin 06/14/2017   in situ-Right forearm (txpbx), in situ-Right chest (txpbx), well diff-left chest (txpbx), well diff-anterior neck- (txpbx)   Squamous cell carcinoma of skin 08/07/2017   well diff-Left upper shoulder (txpbx), sup and invasive-Left temple (txpbx), well diff-Right upper shin (txpbx), in situ-Right clavicle (txpbx)   Squamous cell carcinoma of skin 10/17/2017   well diff-ant. neck (MOHS), in situ-Right chest, inf (txpbx)   Squamous cell carcinoma of skin 01/21/2018   well diff- Right chest,ulnar (txpbx), well diff- right upper chest (txpbx), in situ-Right ant. crown (txpbx)   Squamous cell carcinoma of skin 08/02/2020   well diff-left lower leg-inferior (Txpbx)   Squamous cell carcinoma of skin 08/02/2020   well diff-right lower leg-mid (txpbx)   Squamous cell carcinoma of skin 08/02/2020   well diff-left chest upper   Squamous cell carcinoma of skin 08/02/2020   well diff-mid parietal scalp (MOHS)   Squamous cell carcinoma of skin 08/02/2020   well diff-right foot inner(txpbx)   Squamous cell carcinoma of skin 08/02/2020   well diff- left lower leg medial (txpbx)   Squamous cell carcinoma of skin 08/02/2020   well diff-left lower leg anterior (txpbx)    Squamous cell carcinoma of skin 08/02/2020   well diff-left lower leg medial (txpbx)   Squamous cell carcinoma of skin 08/02/2020   well diff-right forearm-posterior (txpbx)    Past Surgical History:  Procedure Laterality Date   ABDOMINAL HERNIA REPAIR     Patient's states that she has had 8- 9 hernia surgeries   ABDOMINAL HYSTERECTOMY     BIOPSY  02/15/2021   Procedure: BIOPSY;  Surgeon: Rogene Houston, MD;  [facility];  Service: Endoscopy;;   CATARACT EXTRACTION W/PHACO Right 01/15/2020   Procedure: CATARACT EXTRACTION PHACO AND INTRAOCULAR LENS PLACEMENT ([place]);  Surgeon: Baruch Goldmann, MD;  Location: [facility];  Service: Ophthalmology;  Laterality: Right;  CDE: 7.89   CATARACT EXTRACTION W/PHACO Left 01/29/2020   Procedure: CATARACT EXTRACTION PHACO AND INTRAOCULAR LENS PLACEMENT (IOC) (CDE: 6.33);  Surgeon: Baruch Goldmann, MD;  Location: [facility];  Service: Ophthalmology;  Laterality: Left;   CHOLECYSTECTOMY  2007   COLON SURGERY  2008   Done at [facility]   COLONOSCOPY     [facility] at [facility]   ESOPHAGOGASTRODUODENOSCOPY N/A 08/21/2018   Procedure: ESOPHAGOGASTRODUODENOSCOPY (EGD);  Surgeon: Rogene Houston, MD;  [facility];  Service: Endoscopy;  Laterality: N/A;   ESOPHAGOGASTRODUODENOSCOPY (EGD) WITH PROPOFOL N/A 12/16/2019   Procedure: ESOPHAGOGASTRODUODENOSCOPY (EGD) WITH PROPOFOL;  Surgeon: Rogene Houston, MD;  [facility];  Service: Endoscopy;  Laterality: N/A;   EYE SURGERY     lasix   FLEXIBLE SIGMOIDOSCOPY N/A 10/20/2015   Procedure: FLEXIBLE SIGMOIDOSCOPY;  Surgeon: Rogene Houston, MD;  [facility];  Service: Endoscopy;  Laterality: N/A;  64 - Dr Laural Golden has meeting until 1:00   FLEXIBLE SIGMOIDOSCOPY N/A 07/11/2016   Procedure: FLEXIBLE SIGMOIDOSCOPY;  Surgeon: Rogene Houston, MD;  [facility];  Service: Endoscopy;  Laterality: N/A;  [place] N/A 08/09/2017   Procedure: FLEXIBLE SIGMOIDOSCOPY;   Surgeon: Rogene Houston, MD;  [facility];  Service: Endoscopy;  Laterality: N/A;  1:00   FLEXIBLE SIGMOIDOSCOPY N/A 08/21/2018   Procedure: FLEXIBLE SIGMOIDOSCOPY;  Surgeon: Rogene Houston, MD;  [facility];  Service: Endoscopy;  Laterality: N/A;   FLEXIBLE SIGMOIDOSCOPY N/A 12/16/2019   Procedure: FLEXIBLE SIGMOIDOSCOPY wirh Propofol;  Surgeon: Rogene Houston, MD;  [facility];  Service: Endoscopy;  Laterality: N/A;  7:30   FLEXIBLE SIGMOIDOSCOPY N/A 02/15/2021   Procedure: FLEXIBLE SIGMOIDOSCOPY WITH PROPOFOL;  Surgeon: Rogene Houston, MD;  [facility];  Service: Endoscopy;  Laterality: N/A;  am   FRACTURE SURGERY     right wrist metal plate   LIVER TRANSPLANT  11/19/2013   POLYPECTOMY  08/09/2017   Procedure: POLYPECTOMY;  Surgeon: Rogene Houston, MD;  [facility];  Service: Endoscopy;;  colon small bowel   REVERSE SHOULDER ARTHROPLASTY Left 07/17/2018   Procedure: LEFT REVERSE SHOULDER ARTHROPLASTY;  Surgeon: Justice Britain, MD;  Location: [place];  Service: Orthopedics;  Laterality: Left;  145mn   REVERSE SHOULDER ARTHROPLASTY Right 07/20/2021   Procedure: REVERSE SHOULDER ARTHROPLASTY;  Surgeon: SJustice Britain MD;  [facility];  Service: Orthopedics;  Laterality: Right;   SHOULDER CLOSED REDUCTION Left 09/27/2019   Procedure: CLOSED REDUCTION SHOULDER;  Surgeon: OParalee Cancel MD;  [facility];  Service: Orthopedics;  Laterality: Left;   SPLENECTOMY  2006   TOTAL SHOULDER REVISION Left 11/12/2019   Procedure: Revision Left Reverse Shoulder Arthroplasty with poly exchange SDD;  Surgeon: SJustice Britain MD;  [facility];  Service: Orthopedics;  Laterality: Left;  1254m -SDDC   TYMPANOSTOMY TUBE PLACEMENT     UPPER GASTROINTESTINAL ENDOSCOPY     Done at [facility]    There were no vitals filed for this visit.   Subjective Assessment - 10/02/21 0911     Subjective Pt denies any pain upon arriving for today's  treatment session.  Pt states that she has been performing her tband exercises at home.    Pertinent History Reverse TSA (L: 2021) (R: 07/20/21), history of skin cancer    Limitations Lifting;Writing    Patient Stated Goals make crafts, reaching overhead,    Currently in Pain? No/denies    Pain Onset More than a month ago                               [facility] 10[facility]21 0001       Shoulder Exercises: Seated   Extension Both;Strengthening;15 reps;Theraband    Theraband Level (Shoulder Extension) Level 2 (Red)    Extension Limitations  Cues for posture    Row Strengthening;15 reps;Theraband    Theraband Level (Shoulder Row) Level 2 (Red)    Flexion Right;15 reps    Other Seated Exercises Bicep curls; 3 way, 2#, 15 reps      Shoulder Exercises: ROM/Strengthening   Nustep Lvl 5 x 15 mins                       PT Short Term Goals - 09/12/21 0841       PT SHORT TERM GOAL #1   Title Patient will be able to demonstrate at least 60 degrees of shoulder flexion for improved function reaching.    Baseline 86    Time 3    Period Weeks    Status Achieved    Target Date 09/13/21      PT SHORT TERM GOAL #2   Title Patient will be able to demonstrate at least 60 degrees of shoulder abduction for improved function reaching for her daily activities.    Baseline 84    Time 3    Period Weeks    Status Achieved    Target Date 09/13/21               PT Long Term Goals - 09/28/21 0910       PT LONG TERM GOAL #1   Title Patient will be independent with her HEP.    Time 6    Period Weeks    Status Achieved      PT LONG TERM GOAL #2   Title Patient will be able to demonstrate at least 120 degrees of active shoulder flexion for improved function reaching overhead.    Baseline 86; 138 degrees 11/17    Time 6    Period Weeks    Status Achieved      PT LONG TERM GOAL #3   Title Patient will be able to demonstrate at least  120 degrees of active shoulder abduction for improved function reaching.    Baseline 84; 125 degrees 11/17    Time 6    Period Weeks    Status Achieved      PT LONG TERM GOAL #4   Title Patient will be able to demonstrate at least 3+/5 strength with internal and external rotation.    Time 2    Period Weeks    Status New    Target Date 10/13/21      PT LONG TERM GOAL #5   Status --                   Plan - 10/02/21 0912     Clinical Impression Statement Pt arrives for today's treatment session denying any pain.  Pt states that she is going to the surgeon a week from today and is hoping that she will be released from therapy at that time.  Pt most limited by strength deficits at this time, especially in the ER/IR directions.    Personal Factors and Comorbidities Other;Time since onset of injury/illness/exacerbation    Examination-Activity Limitations Bathing;Reach Overhead;Carry;Dressing;Hygiene/Grooming;Lift    Examination-Participation Restrictions Community Activity;Shop;Yard Work    Stability/Clinical Decision Making Stable/Uncomplicated    Rehab Potential Good    PT Frequency 2x / week    PT Duration 6 weeks    PT Treatment/Interventions Electrical Stimulation;Neuromuscular re-education;Therapeutic exercise;Therapeutic activities;Patient/family education;Manual techniques;Energy conservation;Passive range of motion;Scar mobilization;Taping;Vasopneumatic Device    PT Next Visit Plan DOS: 07/20/21; see protocol    PT Home Exercise  Plan scapular retraction, bicep curl, and towel squeeze    Consulted and Agree with Plan of Care Patient             Patient will benefit from skilled therapeutic intervention in order to improve the following deficits and impairments:  Decreased range of motion, Impaired UE functional use, Pain, Decreased strength  Visit Diagnosis: Stiffness of right shoulder, not elsewhere classified     Problem List Patient Active Problem List    Diagnosis Date Noted   Positive urine culture 09/05/2021   Dysuria 08/29/2021   Elevated alkaline phosphatase level 02/02/2021   S/P reverse total shoulder arthroplasty, left 11/12/2019   AKI (acute kidney injury) ([place]) 10/05/2019   Anterior dislocation of left shoulder 09/26/2019   Hx of colonic polyps 09/04/2019   Chest pain 09/04/2019   Closed fracture of shaft of right ulna 07/08/2019   Fracture of shaft of ulna 07/03/2019   Benzodiazepine dependence ([place]) 04/13/2019   Controlled substance agreement signed 04/13/2019   Urticaria 09/08/2018   S/p reverse total shoulder arthroplasty 07/17/2018   History of colon cancer 06/12/2018   History of colonic polyps 06/12/2018   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   Gastroesophageal reflux disease 06/12/2018   Rotator cuff tear arthropathy 05/28/2018   Hypertension 04/08/2018   Chronic diarrhea 06/26/2017   Iron deficiency anemia 06/26/2017   GAD (generalized anxiety disorder) 08/28/2016   Vitamin D deficiency 08/28/2016   Insomnia 08/28/2016   Immunosuppression ([place]) 12/02/2015   Liver transplant recipient [facility]) 11/30/2015   Seizures ([place]) 09/27/2015   Hx of liver transplant ([place]) 12/08/2014   Colon cancer ([place]) 12/08/2014   Adenocarcinoma in a polyp ([place]) 09/17/2014   Portal vein thrombosis 11/19/2013   Hepatic encephalopathy 10/26/2013   Personal history of colonic polyps 12/27/2012   Hypothyroidism 03/24/2012   Ventral hernia, recurrent 03/24/2012   Hernia 09/04/2011   Other secondary pulmonary hypertension ([place]) 08/16/2011    Kathrynn Ducking, PTA 10/02/2021, 9:58 AM  [facility] [place], [place], [address] Phone: [phone]   Fax:  [phone]  Name: NEHEMIE CASSERLY MRN: [id number] Date of Birth: 09-Oct-1951  PHYSICAL THERAPY DISCHARGE SUMMARY  Visits from Start of Care: 11  Current functional level related to goals / functional outcomes: Patient was able to meet  most of her goals for physical therapy. She is being discharged at this time as she has not attended a therapy appointment since 10/02/21.   Remaining deficits: weakness   Education / Equipment: HEP   Patient agrees to discharge. Patient goals were partially met. Patient is being discharged due to not returning since the last visit.   Jacqulynn Cadet, PT, DPT

## 2021-10-03 ENCOUNTER — Other Ambulatory Visit: Payer: Self-pay | Admitting: Family

## 2021-10-03 DIAGNOSIS — Z79899 Other long term (current) drug therapy: Secondary | ICD-10-CM

## 2021-10-03 DIAGNOSIS — F1320 Sedative, hypnotic or anxiolytic dependence, uncomplicated: Secondary | ICD-10-CM

## 2021-10-03 DIAGNOSIS — F411 Generalized anxiety disorder: Secondary | ICD-10-CM

## 2021-10-04 ENCOUNTER — Other Ambulatory Visit: Payer: Self-pay

## 2021-10-04 ENCOUNTER — Encounter (HOSPITAL_COMMUNITY): Payer: Self-pay

## 2021-10-04 ENCOUNTER — Inpatient Hospital Stay (HOSPITAL_COMMUNITY): Payer: Medicare Other

## 2021-10-04 VITALS — BP 138/72 | HR 70 | Temp 97.8°F | Resp 18

## 2021-10-04 DIAGNOSIS — Z79899 Other long term (current) drug therapy: Secondary | ICD-10-CM | POA: Diagnosis not present

## 2021-10-04 DIAGNOSIS — D509 Iron deficiency anemia, unspecified: Secondary | ICD-10-CM | POA: Diagnosis not present

## 2021-10-04 DIAGNOSIS — Z944 Liver transplant status: Secondary | ICD-10-CM | POA: Diagnosis not present

## 2021-10-04 DIAGNOSIS — D508 Other iron deficiency anemias: Secondary | ICD-10-CM

## 2021-10-04 MED ORDER — SODIUM CHLORIDE 0.9 % IV SOLN
300.0000 mg | Freq: Once | INTRAVENOUS | Status: AC
  Administered 2021-10-04: 300 mg via INTRAVENOUS
  Filled 2021-10-04: qty 300

## 2021-10-04 MED ORDER — SODIUM CHLORIDE 0.9 % IV SOLN
Freq: Once | INTRAVENOUS | Status: AC
Start: 2021-10-04 — End: 2021-10-04

## 2021-10-04 NOTE — Patient Instructions (Signed)
[facility]  Discharge Instructions: Thank you for choosing [facility] to provide your oncology and hematology care.  If you have a lab appointment with the Cancer Center, please come in thru the Main Entrance and check in at the main information desk.  Wear comfortable clothing and clothing appropriate for easy access to any Portacath or PICC line.   We strive to give you quality time with your provider. You may need to reschedule your appointment if you arrive late (15 or more minutes).  Arriving late affects you and other patients whose appointments are after yours.  Also, if you miss three or more appointments without notifying the office, you may be dismissed from the clinic at the provider's discretion.      For prescription refill requests, have your pharmacy contact our office and allow 72 hours for refills to be completed.    Today you received the following: Venofer, return as scheduled.   To help prevent nausea and vomiting after your treatment, we encourage you to take your nausea medication as directed.  BELOW ARE SYMPTOMS THAT SHOULD BE REPORTED IMMEDIATELY: *FEVER GREATER THAN 100.4 F (38 C) OR HIGHER *CHILLS OR SWEATING *NAUSEA AND VOMITING THAT IS NOT CONTROLLED WITH YOUR NAUSEA MEDICATION *UNUSUAL SHORTNESS OF BREATH *UNUSUAL BRUISING OR BLEEDING *URINARY PROBLEMS (pain or burning when urinating, or frequent urination) *BOWEL PROBLEMS (unusual diarrhea, constipation, pain near the anus) TENDERNESS IN MOUTH AND THROAT WITH OR WITHOUT PRESENCE OF ULCERS (sore throat, sores in mouth, or a toothache) UNUSUAL RASH, SWELLING OR PAIN  UNUSUAL VAGINAL DISCHARGE OR ITCHING   Items with * indicate a potential emergency and should be followed up as soon as possible or go to the Emergency Department if any problems should occur.  Please show the CHEMOTHERAPY ALERT CARD or IMMUNOTHERAPY ALERT CARD at check-in to the Emergency Department and triage  nurse.  Should you have questions after your visit or need to cancel or reschedule your appointment, please contact [facility] [phone]  and follow the prompts.  Office hours are 8:00 a.m. to 4:30 p.m. Monday - Friday. Please note that voicemails left after 4:00 p.m. may not be returned until the following business day.  We are closed weekends and major holidays. You have access to a nurse at all times for urgent questions. Please call the main number to the clinic [phone] and follow the prompts.  For any non-urgent questions, you may also contact your provider using MyChart. We now offer e-Visits for anyone 18 and older to request care online for non-urgent symptoms. For details visit [email or web address].   Also download the MyChart app! Go to the app store, search "MyChart", open the app, select [facility], and log in with your MyChart username and password.  Due to Covid, a mask is required upon entering the hospital/clinic. If you do not have a mask, one will be given to you upon arrival. For doctor visits, patients may have 1 support person aged 18 or older with them. For treatment visits, patients cannot have anyone with them due to current Covid guidelines and our immunocompromised population.  

## 2021-10-04 NOTE — Progress Notes (Signed)
Patient presents today for iron infusion. Patient took tylenol and Claritin this morning at home. Patient tolerated iron infusion with no complaints voiced. Peripheral IV site clean and dry with good blood return noted before and after infusion. Band aid applied. Patient refused to stay 30 min wait time per protocol. Patient declined AVS. VSS with discharge and left in satisfactory condition with no s/s of distress noted.

## 2021-10-09 ENCOUNTER — Ambulatory Visit: Payer: Medicare Other

## 2021-10-09 DIAGNOSIS — M25511 Pain in right shoulder: Secondary | ICD-10-CM | POA: Diagnosis not present

## 2021-10-13 ENCOUNTER — Other Ambulatory Visit: Payer: Self-pay

## 2021-10-13 ENCOUNTER — Encounter (HOSPITAL_COMMUNITY): Payer: Self-pay

## 2021-10-13 ENCOUNTER — Inpatient Hospital Stay (HOSPITAL_COMMUNITY): Payer: Medicare Other | Attending: Hematology

## 2021-10-13 VITALS — BP 127/80 | HR 60 | Temp 97.4°F | Resp 18 | Ht 63.0 in | Wt 147.2 lb

## 2021-10-13 DIAGNOSIS — D509 Iron deficiency anemia, unspecified: Secondary | ICD-10-CM | POA: Insufficient documentation

## 2021-10-13 DIAGNOSIS — D508 Other iron deficiency anemias: Secondary | ICD-10-CM

## 2021-10-13 MED ORDER — SODIUM CHLORIDE 0.9 % IV SOLN
Freq: Once | INTRAVENOUS | Status: AC
Start: 2021-10-13 — End: 2021-10-13

## 2021-10-13 MED ORDER — SODIUM CHLORIDE 0.9 % IV SOLN
300.0000 mg | Freq: Once | INTRAVENOUS | Status: AC
  Administered 2021-10-13: 300 mg via INTRAVENOUS
  Filled 2021-10-13: qty 300

## 2021-10-13 NOTE — Progress Notes (Signed)
Patient presents today for Venofer 300 mgs. Vital signs stable. Patient has no complaints of any changes since her last visit. Patient takes pre-meds prior to arrival at 07:30 am. Tylenol 650 mg PO and Claritin 10 mgs PO.   Venofer 300 mgs given today per MD orders. Tolerated infusion without adverse affects. Vital signs stable. No complaints at this time. Discharged from clinic ambulatory in stable condition. Alert and oriented x 3. F/U with [facility] as scheduled.

## 2021-10-20 ENCOUNTER — Ambulatory Visit (HOSPITAL_COMMUNITY): Payer: Medicare Other

## 2021-10-23 ENCOUNTER — Encounter (HOSPITAL_COMMUNITY): Payer: Self-pay

## 2021-10-23 ENCOUNTER — Other Ambulatory Visit: Payer: Self-pay

## 2021-10-23 ENCOUNTER — Inpatient Hospital Stay (HOSPITAL_COMMUNITY): Payer: Medicare Other

## 2021-10-23 VITALS — BP 114/59 | HR 76 | Temp 96.7°F | Resp 18

## 2021-10-23 DIAGNOSIS — D509 Iron deficiency anemia, unspecified: Secondary | ICD-10-CM | POA: Diagnosis not present

## 2021-10-23 DIAGNOSIS — D508 Other iron deficiency anemias: Secondary | ICD-10-CM

## 2021-10-23 MED ORDER — SODIUM CHLORIDE 0.9 % IV SOLN
300.0000 mg | Freq: Once | INTRAVENOUS | Status: AC
  Administered 2021-10-23: 300 mg via INTRAVENOUS
  Filled 2021-10-23: qty 300

## 2021-10-23 MED ORDER — SODIUM CHLORIDE 0.9 % IV SOLN
Freq: Once | INTRAVENOUS | Status: AC
Start: 2021-10-23 — End: 2021-10-23

## 2021-10-23 NOTE — Progress Notes (Signed)
Patient presents today for Venofer 300 mg iron infusion. MAR reviewed. Vital signs stable. Patient has complaints of fatigue not improving with her iron infusions and is requesting to talk with [facility] PA.   Patient took Tylenol and Claritin prior to arrival at 09:25 am.   Venofer given today per MD orders. Tolerated infusion without adverse affects. Vital signs stable. No complaints at this time. Patient advised to be monitored 30 minutes post infusion and patient declined. Understanding verbalized by patient of policy. Understanding verbalized. Patient teaching performed. Discharged from clinic ambulatory in stable condition. Alert and oriented x 3. F/U with [facility] as scheduled.

## 2021-10-23 NOTE — Patient Instructions (Signed)
[facility]  Discharge Instructions: °Thank you for choosing [facility] to provide your oncology and hematology care.  °If you have a lab appointment with the Cancer Center, please come in thru the Main Entrance and check in at the main information desk. ° °Wear comfortable clothing and clothing appropriate for easy access to any Portacath or PICC line.  ° °We strive to give you quality time with your provider. You may need to reschedule your appointment if you arrive late (15 or more minutes).  Arriving late affects you and other patients whose appointments are after yours.  Also, if you miss three or more appointments without notifying the office, you may be dismissed from the clinic at the provider’s discretion.    °  °For prescription refill requests, have your pharmacy contact our office and allow 72 hours for refills to be completed.   ° °Today you received the following : Venofer 300 mg.     °  °To help prevent nausea and vomiting after your treatment, we encourage you to take your nausea medication as directed. ° °BELOW ARE SYMPTOMS THAT SHOULD BE REPORTED IMMEDIATELY: °*FEVER GREATER THAN 100.4 F (38 °C) OR HIGHER °*CHILLS OR SWEATING °*NAUSEA AND VOMITING THAT IS NOT CONTROLLED WITH YOUR NAUSEA MEDICATION °*UNUSUAL SHORTNESS OF BREATH °*UNUSUAL BRUISING OR BLEEDING °*URINARY PROBLEMS (pain or burning when urinating, or frequent urination) °*BOWEL PROBLEMS (unusual diarrhea, constipation, pain near the anus) °TENDERNESS IN MOUTH AND THROAT WITH OR WITHOUT PRESENCE OF ULCERS (sore throat, sores in mouth, or a toothache) °UNUSUAL RASH, SWELLING OR PAIN  °UNUSUAL VAGINAL DISCHARGE OR ITCHING  ° °Items with * indicate a potential emergency and should be followed up as soon as possible or go to the Emergency Department if any problems should occur. ° °Please show the CHEMOTHERAPY ALERT CARD or IMMUNOTHERAPY ALERT CARD at check-in to the Emergency Department and triage nurse. ° °Should  you have questions after your visit or need to cancel or reschedule your appointment, please contact [facility] [phone]  and follow the prompts.  Office hours are 8:00 a.m. to 4:30 p.m. Monday - Friday. Please note that voicemails left after 4:00 p.m. may not be returned until the following business day.  We are closed weekends and major holidays. You have access to a nurse at all times for urgent questions. Please call the main number to the clinic [phone] and follow the prompts. ° °For any non-urgent questions, you may also contact your provider using MyChart. We now offer e-Visits for anyone 18 and older to request care online for non-urgent symptoms. For details visit [email or web address]. °  °Also download the MyChart app! Go to the app store, search "MyChart", open the app, select , and log in with your MyChart username and password. ° °Due to Covid, a mask is required upon entering the hospital/clinic. If you do not have a mask, one will be given to you upon arrival. For doctor visits, patients may have 1 support person aged 18 or older with them. For treatment visits, patients cannot have anyone with them due to current Covid guidelines and our immunocompromised population.  °

## 2021-10-24 ENCOUNTER — Other Ambulatory Visit: Payer: Self-pay | Admitting: Family

## 2021-10-25 ENCOUNTER — Ambulatory Visit: Payer: Medicare Other | Admitting: Family

## 2021-10-26 DIAGNOSIS — Z944 Liver transplant status: Secondary | ICD-10-CM | POA: Diagnosis not present

## 2021-11-07 ENCOUNTER — Other Ambulatory Visit: Payer: Self-pay

## 2021-11-07 ENCOUNTER — Ambulatory Visit (INDEPENDENT_AMBULATORY_CARE_PROVIDER_SITE_OTHER): Payer: Medicare Other | Admitting: Internal Medicine

## 2021-11-07 ENCOUNTER — Encounter (INDEPENDENT_AMBULATORY_CARE_PROVIDER_SITE_OTHER): Payer: Self-pay | Admitting: Internal Medicine

## 2021-11-07 DIAGNOSIS — D509 Iron deficiency anemia, unspecified: Secondary | ICD-10-CM

## 2021-11-07 DIAGNOSIS — Z944 Liver transplant status: Secondary | ICD-10-CM

## 2021-11-07 DIAGNOSIS — K219 Gastro-esophageal reflux disease without esophagitis: Secondary | ICD-10-CM | POA: Diagnosis not present

## 2021-11-07 DIAGNOSIS — L509 Urticaria, unspecified: Secondary | ICD-10-CM

## 2021-11-07 DIAGNOSIS — Z85038 Personal history of other malignant neoplasm of large intestine: Secondary | ICD-10-CM | POA: Diagnosis not present

## 2021-11-07 DIAGNOSIS — R1013 Epigastric pain: Secondary | ICD-10-CM | POA: Diagnosis not present

## 2021-11-07 DIAGNOSIS — E039 Hypothyroidism, unspecified: Secondary | ICD-10-CM

## 2021-11-07 MED ORDER — PANTOPRAZOLE SODIUM 40 MG PO TBEC: 40.0000 mg | DELAYED_RELEASE_TABLET | ORAL | 3 refills | Status: DC

## 2021-11-07 MED ORDER — FAMOTIDINE 20 MG PO TABS: 20.0000 mg | ORAL_TABLET | Freq: Two times a day (BID) | ORAL | 5 refills | Status: DC | PRN

## 2021-11-07 MED ORDER — FAMOTIDINE 20 MG PO TABS
20.0000 mg | ORAL_TABLET | Freq: Two times a day (BID) | ORAL | 5 refills | Status: DC
Start: 2021-11-07 — End: 2022-06-08

## 2021-11-07 MED ORDER — LOPERAMIDE HCL 2 MG PO CAPS: 4.0000 mg | ORAL_CAPSULE | Freq: Three times a day (TID) | ORAL | 0 refills | Status: DC | PRN

## 2021-11-07 NOTE — Progress Notes (Signed)
Presenting complaint;  Follow for chronic GERD, history of colon carcinoma. History of live donor liver transplant for PBC Iron deficiency anemia.  Database and subjective:  Patient is 70 year old Caucasian female who is here for scheduled visit.  She was last seen 6 months ago.  She was last seen for acute diarrhea.  Work-up was negative and she responded to antibiotic therapy. He has a history of PBC for which she underwent live donor liver transplant in January 2015 at [facility] in [place] and now being followed by Dr. Laurier Nancy of [facility].  Patient says she is due for blood work.   She also has history of colon carcinoma and determined to have Lynch syndrome.  She had subtotal colectomy in 2005.  She has been getting yearly flexible sigmoidoscopies. She also has history of iron deficiency anemia.  He had EGD and flexible sigmoidoscopy last year.  She has been receiving iron infusion. She also has chronic GERD maintained on double dose PPI. Patient is also famotidine which was prescribed for urticaria.  Patient says she is doing well other than she feels tired and gets exhausted with mild physical activity.  She is receiving periodic iron infusion.  She had low hemoglobin 8 weeks ago.  Last iron infusion was 3 weeks ago.  She denies melena or rectal bleeding hematuria or vaginal bleeding.  EGD in February last year revealed mild gastric antral vascular ectasia felt not to be the source of bleeding. She remains with an average of 4 bowel movements which occur every morning and she seemed to do fine rest of the day.  Stool consistency is between loose and soft.  Rarely she may have formed stool.  She is not having epigastric pain.  She rarely has heartburn. She has had multiple skin cancers and she is using 5-FU topically as needed but not currently.  Current Medications: Outpatient Encounter Medications as of 11/07/2021  Medication Sig   acetaminophen (TYLENOL) 500 MG tablet Take  1,000 mg by mouth every 6 (six) hours as needed (for pain.).    alendronate (FOSAMAX) 70 MG tablet TAKE 1 TABLET EVERY WEEK   ALPRAZolam (XANAX) 0.5 MG tablet Take 1 tablet (0.5 mg total) by mouth at bedtime.   aspirin EC 81 MG tablet Take 81 mg by mouth daily.   Biotin 1000 MCG tablet Take 1,000 mcg by mouth 3 (three) times daily.   Calcium Citrate-Vitamin D (CALCIUM CITRATE + D3 PO) Take 600 mg by mouth 2 (two) times daily.   cetirizine (ZYRTEC) 10 MG tablet TAKE 1 TO 2 TABLETS TWICE DAILY (Patient taking differently: Take 10 mg by mouth daily as needed for allergies.)   clotrimazole-betamethasone (LOTRISONE) cream Apply 1 application topically 2 (two) times daily. (Patient taking differently: Apply 1 application topically daily as needed (Rash).)   Cyanocobalamin (VITAMIN B-12) 5000 MCG TBDP Take 5,000 mcg by mouth 2 (two) times a week. Every Monday and Wednesday.   cyclobenzaprine (FLEXERIL) 10 MG tablet Take 1 tablet (10 mg total) by mouth 3 (three) times daily as needed for muscle spasms.   fluorouracil (EFUDEX) 5 % cream Apply 1 application topically daily as needed (cancer spots).    gabapentin (NEURONTIN) 100 MG capsule Take 1 capsule (100 mg total) by mouth daily.   levothyroxine (SYNTHROID) 112 MCG tablet Take 1 tablet (112 mcg total) by mouth daily.   losartan (COZAAR) 25 MG tablet Take 1 tablet (25 mg total) by mouth daily.   metoprolol succinate (TOPROL XL) 25 MG 24 hr  tablet Take 1 tablet (25 mg total) by mouth daily.   mupirocin ointment (BACTROBAN) 2 % Apply 1 application topically daily as needed (Cancer spots).   NIACINAMIDE-ZINC-FOLIC ACID PO Take 2 tablets by mouth 2 (two) times daily.   ursodiol (ACTIGALL) 250 MG tablet Take 250 mg by mouth 2 (two) times daily.    Vitamin D, Ergocalciferol, (DRISDOL) 1.25 MG (50000 UNIT) CAPS capsule Take 1 capsule (50,000 Units total) by mouth once a week.   vitamin E 180 MG (400 UNITS) capsule Take 400 Units by mouth daily.   ZORTRESS 0.5  MG TABS Take 4 tablets (2 mg total) by mouth 2 (two) times daily.   [DISCONTINUED] famotidine (PEPCID) 20 MG tablet TAKE ONE (1) TABLET BY MOUTH TWO (2) TIMES DAILY   [DISCONTINUED] loperamide (IMODIUM) 2 MG capsule Take 2 capsules (4 mg total) by mouth 3 (three) times daily as needed for diarrhea or loose stools.   [DISCONTINUED] pantoprazole (PROTONIX) 40 MG tablet Take 1 tablet (40 mg total) by mouth 2 (two) times daily.   famotidine (PEPCID) 20 MG tablet Take 1 tablet (20 mg total) by mouth 2 (two) times daily as needed for heartburn or indigestion.   famotidine (PEPCID) 20 MG tablet Take 1 tablet (20 mg total) by mouth 2 (two) times daily.   loperamide (IMODIUM) 2 MG capsule Take 2 capsules (4 mg total) by mouth 3 (three) times daily as needed for diarrhea or loose stools.   pantoprazole (PROTONIX) 40 MG tablet Take 1 tablet (40 mg total) by mouth every morning.   [DISCONTINUED] amoxicillin-clavulanate (AUGMENTIN) 875-125 MG tablet Take 1 tablet by mouth 2 (two) times daily.   [DISCONTINUED] famotidine (PEPCID) 20 MG tablet Take 1 tablet (20 mg total) by mouth 2 (two) times daily as needed for heartburn or indigestion.   [DISCONTINUED] SSD 1 % cream Apply 1 application topically daily as needed (Radiation).   No facility-administered encounter medications on file as of 11/07/2021.     Objective: Blood pressure 110/73, pulse 69, temperature (!) 97.5 F (36.4 C), temperature source Oral, height 5\' 3"  (1.6 m), weight 145 lb 4.8 oz (65.9 kg). Patient is alert and in no acute distress. She has central alopecia. Conjunctiva is pink. Sclera is nonicteric Oropharyngeal mucosa is normal. No neck masses or thyromegaly noted. Cardiac exam with regular rhythm normal S1 and S2. No murmur or gallop noted. Lungs are clear to auscultation. Abdomen vertical and horizontal long abdominal scars.  Abdomen is somewhat asymmetrical with prominence in right mid abdomen.  On palpation is soft and nontender  with organomegaly or masses. No LE edema or clubbing noted.  Labs/studies Results:  Lab data from 04/12/2021([facility]). Glucose 98 BUN 17 and creatinine 1.05 Serum sodium 138, potassium 5.5, chloride 101, CO2 23 Serum calcium 9.0 Bilirubin 0.2, AP 2-3, AST 29 and ALT 18 Total protein 7.1 with albumin of 3.9.  Lab data from 09/14/2021 WBC 6.7 H&H 9.2 and 31.3 Platelet count 401K  Serum iron 16, TIBC 334 and saturation 5% Serum ferritin 22 Serum B12 577  Assessment:  #1.  Iron deficiency anemia.  She is receiving periodic iron infusion.  No history of GI blood loss.  She is up-to-date as far as surveillance for colon carcinoma.  EGD in February last year revealed mild gastric antral vascular ectasia without stigmata of bleeding.  I suspect this may be the source of bleeding but more likely it is impaired iron absorption since she has been on rather aggressive acid suppression with double dose  PPI.  She is also on famotidine which she takes for urticaria and therefore will not be discontinued.  #2.  History of colon carcinoma.  She also carries gene for Lynch syndrome.  She has subtotal colectomy in 2005.  She only has distal sigmoid colon and rectum in situ.  Last sigmoidoscopy was in April 2022 and she would be due for another exam in April next year.  #3.  Chronic GERD.  Symptoms are very well controlled with current therapy.  Given history of iron deficiency anemia it would be worthwhile to decrease PPI dose to once a day and see how she does.  Decrease in acid suppression may help improve iron absorption.  #4.  Patient is status post LDLT for primary biliary cirrhosis in January 2015.  She remains in remission.  She has a history of anastomotic biliary stricture treated with biliary stenting.  She has had mildly elevated alkaline phosphatase and therefore started on Urso.  She is due for her blood work including tacrolimus levels.  She is getting periodic blood work via [facility].  She is being  followed by Dr. Laurier Nancy.  #5.  Extreme fatigue.  Fatigue does not appear to be improving with iron infusion.  It remains to be seen if anemia has corrected.  Will need to check TSH to make sure she is an appropriate replacement dose. Fatigue may be secondary to her medications.   Plan:  Hemoccult x3. Patient will go to the lab for CBC with differential, comprehensive chemistry panel, serum magnesium, tacrolimus level as well as TSH. If Hemoccults are positive would consider esophagogastroduodenoscopy with APC ablation of gastric antral vascular ectasia along with civilians flexible sigmoidoscopy for which she would be due in April 2023. Office visit in 6 months.

## 2021-11-07 NOTE — Patient Instructions (Addendum)
Hemoccult x3. Physician will call with results of blood test when completed.

## 2021-11-10 ENCOUNTER — Other Ambulatory Visit (INDEPENDENT_AMBULATORY_CARE_PROVIDER_SITE_OTHER): Payer: Self-pay | Admitting: Internal Medicine

## 2021-11-10 DIAGNOSIS — Z944 Liver transplant status: Secondary | ICD-10-CM

## 2021-11-10 LAB — TACROLIMUS LEVEL: Tacrolimus Lvl: <0.5 ng/mL — ABNORMAL LOW (ref 2.0–20.0)

## 2021-11-10 LAB — MAGNESIUM: Magnesium: 2.2 mg/dL (ref 1.6–2.3)

## 2021-11-10 LAB — CBC WITH DIFFERENTIAL/PLATELET
Basophils Absolute: 0.1 10*3/uL (ref 0.0–0.2)
Basos: 2 %
EOS (ABSOLUTE): 0.1 10*3/uL (ref 0.0–0.4)
Eos: 2 %
Hematocrit: 36.6 % (ref 34.0–46.6)
Hemoglobin: 11.7 g/dL (ref 11.1–15.9)
Immature Grans (Abs): 0 10*3/uL (ref 0.0–0.1)
Immature Granulocytes: 0 %
Lymphocytes Absolute: 1.7 10*3/uL (ref 0.7–3.1)
Lymphs: 28 %
MCH: 27.7 pg (ref 26.6–33.0)
MCHC: 32 g/dL (ref 31.5–35.7)
MCV: 87 fL (ref 79–97)
Monocytes Absolute: 0.8 10*3/uL (ref 0.1–0.9)
Monocytes: 13 %
Neutrophils Absolute: 3.3 10*3/uL (ref 1.4–7.0)
Neutrophils: 55 %
Platelets: 384 10*3/uL (ref 150–450)
RBC: 4.23 x10E6/uL (ref 3.77–5.28)
RDW: 16.3 % — ABNORMAL HIGH (ref 11.7–15.4)
WBC: 6 10*3/uL (ref 3.4–10.8)

## 2021-11-10 LAB — COMPREHENSIVE METABOLIC PANEL
ALT: 15 IU/L (ref 0–32)
AST: 25 IU/L (ref 0–40)
Albumin/Globulin Ratio: 1.4 (ref 1.2–2.2)
Albumin: 4 g/dL (ref 3.8–4.8)
Alkaline Phosphatase: 182 IU/L — ABNORMAL HIGH (ref 44–121)
BUN/Creatinine Ratio: 16 (ref 12–28)
BUN: 16 mg/dL (ref 8–27)
Bilirubin Total: <0.2 mg/dL (ref 0.0–1.2)
CO2: 22 mmol/L (ref 20–29)
Calcium: 8.8 mg/dL (ref 8.7–10.3)
Chloride: 103 mmol/L (ref 96–106)
Creatinine, Ser: 0.98 mg/dL (ref 0.57–1.00)
Globulin, Total: 2.8 g/dL (ref 1.5–4.5)
Glucose: 91 mg/dL (ref 70–99)
Potassium: 4.9 mmol/L (ref 3.5–5.2)
Sodium: 140 mmol/L (ref 134–144)
Total Protein: 6.8 g/dL (ref 6.0–8.5)
eGFR: 62 mL/min/{1.73_m2} (ref >59)

## 2021-11-10 LAB — TSH: TSH: 1.29 u[IU]/mL (ref 0.450–4.500)

## 2021-11-14 ENCOUNTER — Ambulatory Visit (INDEPENDENT_AMBULATORY_CARE_PROVIDER_SITE_OTHER): Payer: Medicare Other | Admitting: Physician Assistant

## 2021-11-14 ENCOUNTER — Encounter: Payer: Self-pay | Admitting: Physician Assistant

## 2021-11-14 ENCOUNTER — Other Ambulatory Visit: Payer: Self-pay

## 2021-11-14 DIAGNOSIS — C44521 Squamous cell carcinoma of skin of breast: Secondary | ICD-10-CM

## 2021-11-14 DIAGNOSIS — D485 Neoplasm of uncertain behavior of skin: Secondary | ICD-10-CM

## 2021-11-14 DIAGNOSIS — C44622 Squamous cell carcinoma of skin of right upper limb, including shoulder: Secondary | ICD-10-CM | POA: Diagnosis not present

## 2021-11-14 DIAGNOSIS — C44329 Squamous cell carcinoma of skin of other parts of face: Secondary | ICD-10-CM

## 2021-11-14 DIAGNOSIS — Z944 Liver transplant status: Secondary | ICD-10-CM | POA: Diagnosis not present

## 2021-11-14 NOTE — Patient Instructions (Signed)
Ask Liver Doctor about starting Soriatane (Acitretin)    Biopsy, Surgery (Curettage) & Surgery (Excision) Aftercare Instructions  1. Okay to remove bandage in 24 hours  2. Wash area with soap and water  3. Apply Vaseline to area twice daily until healed (Not Neosporin)  4. Okay to cover with a Band-Aid to decrease the chance of infection or prevent irritation from clothing; also it's okay to uncover lesion at home.  5. Suture instructions: return to our office in 7-10 or 10-14 days for a nurse visit for suture removal. Variable healing with sutures, if pain or itching occurs call our office. It's okay to shower or bathe 24 hours after sutures are given.  6. The following risks may occur after a biopsy, curettage or excision: bleeding, scarring, discoloration, recurrence, infection (redness, yellow drainage, pain or swelling).  7. For questions, concerns and results call our office at [place] before 4pm & Friday before 3pm. Biopsy results will be available in 1 week.

## 2021-11-15 ENCOUNTER — Telehealth (INDEPENDENT_AMBULATORY_CARE_PROVIDER_SITE_OTHER): Payer: Self-pay | Admitting: *Deleted

## 2021-11-15 ENCOUNTER — Encounter: Payer: Self-pay | Admitting: Physician Assistant

## 2021-11-15 NOTE — Telephone Encounter (Signed)
° °  Diagnosis: IDA   Result(s)   Card 1:  11/12/21- Positive    Card 2: 11/13/21 Positive   Card 3: 11/14/21 Positive    Completed by: Abigail Butts [name] lpn   HEMOCCULT SENSA DEVELOPER: LOT#: [id number]   EXPIRATION DATE: 6/24   HEMOCCULT SENSA CARD: LOT#: [id number]  EXPIRATION DATE: 2/24   CARD CONTROL RESULTS: POSITIVE:  + NEGATIVE: -    ADDITIONAL COMMENTS:   Patient seen 12/27 and copied Dr. Olevia Perches plan below from that visit.  Hemoccult x3. If Hemoccults are positive would consider esophagogastroduodenoscopy with APC ablation of gastric antral vascular ectasia along with civilians flexible sigmoidoscopy for which she would be due in April 2023.

## 2021-11-15 NOTE — Progress Notes (Signed)
Follow-Up Visit   Subjective  Cassidy Barber is a 71 y.o. female who presents for the following: Skin Problem (Patient here today for multiple lesions x months. Per patient she has a lesion on her right arm, lesion on her right breast, non healing lesion on her right lower leg, and a few lesions on her left leg. ). She hasn't been in 6 months because her hemoglobin has been really low and  has been having blood transfusions.    The following portions of the chart were reviewed this encounter and updated as appropriate:  Tobacco   Allergies   Meds   Problems   Med Hx   Surg Hx   Fam Hx       Objective  Well appearing patient in no apparent distress; mood and affect are within normal limits.  A full examination was performed including scalp, head, eyes, ears, nose, lips, neck, chest, axillae, abdomen, back, buttocks, bilateral upper extremities, bilateral lower extremities, hands, feet, fingers, toes, fingernails, and toenails. All findings within normal limits unless otherwise noted below.  Right Breast       Left Parotid Area       Right Forearm - Posterior Ulcerated pink plaque        Assessment & Plan  Neoplasm of uncertain behavior of skin (2) Right Breast  Skin / nail biopsy Type of biopsy: tangential   Informed consent: discussed and consent obtained   Timeout: patient name, date of birth, surgical site, and procedure verified   Procedure prep:  Patient was prepped and draped in usual sterile fashion (Non sterile) Prep type:  Chlorhexidine Anesthesia: the lesion was anesthetized in a standard fashion   Anesthetic:  1% lidocaine w/ epinephrine 1-100,000 local infiltration Instrument used: flexible razor blade   Hemostasis achieved with: aluminum chloride and electrodesiccation   Outcome: patient tolerated procedure well   Post-procedure details: sterile dressing applied and wound care instructions given   Dressing type: bandage and petrolatum    Specimen  2 - Surgical pathology Differential Diagnosis: R/O BCC vs SCC  Check Margins: Yes  Left Parotid Area  Skin / nail biopsy Type of biopsy: tangential   Informed consent: discussed and consent obtained   Timeout: patient name, date of birth, surgical site, and procedure verified   Procedure prep:  Patient was prepped and draped in usual sterile fashion (Non sterile) Prep type:  Chlorhexidine Anesthesia: the lesion was anesthetized in a standard fashion   Anesthetic:  1% lidocaine w/ epinephrine 1-100,000 local infiltration Instrument used: flexible razor blade   Hemostasis achieved with: aluminum chloride   Outcome: patient tolerated procedure well   Post-procedure details: sterile dressing applied and wound care instructions given   Dressing type: bandage and petrolatum    Specimen 3 - Surgical pathology Differential Diagnosis: R/O BCC vs SCC - MOH's  Check Margins: Yes  SCC (squamous cell carcinoma), arm, right Right Forearm - Posterior  Skin excision  Total excision diameter (cm):  7.2 Informed consent: discussed and consent obtained   Timeout: patient name, date of birth, surgical site, and procedure verified   Anesthesia: the lesion was anesthetized in a standard fashion   Anesthetic:  1% lidocaine w/ epinephrine 1-100,000 local infiltration Instrument used: #15 blade   Hemostasis achieved with: pressure and electrodesiccation   Outcome: patient tolerated procedure well with no complications   Post-procedure details: sterile dressing applied and wound care instructions given   Dressing type: bandage, petrolatum and pressure dressing    Skin  repair Complexity:  Simple Final length (cm):  7.2 Informed consent: discussed and consent obtained   Timeout: patient name, date of birth, surgical site, and procedure verified   Procedure prep:  Patient was prepped and draped in usual sterile fashion (non sterile) Prep type:  Chlorhexidine Anesthesia: the lesion was  anesthetized in a standard fashion   Undermining: edges undermined   Fine/surface layer approximation (top stitches):  Suture size:  3-0 Suture type: Vicryl Rapide (coated polyglactin 910) and nylon   Suture type comment:  Nylon Stitches: simple running   Suture removal (days):  14 Hemostasis achieved with: suture, pressure and electrodesiccation Outcome: patient tolerated procedure well with no complications   Post-procedure details: sterile dressing applied and wound care instructions given   Post-procedure details comment:  Non sterile pressure  Dressing type: bandage and petrolatum   Additional details:  3-0 Vicryl x 5 4-0 Ethilon x 1 running  Specimen 1 - Surgical pathology Differential Diagnosis: R/O BCC vs SCC - excision treated after biopsy  Check Margins: Yes Superior margin stained    I, Velmer Woelfel, PA-C, have reviewed all documentation's for this visit.  The documentation on 11/28/21 for the exam, diagnosis, procedures and orders are all accurate and complete.

## 2021-11-17 LAB — EVEROLIMUS: Everolimus: 5.9 ng/mL (ref 3.0–8.0)

## 2021-11-17 NOTE — Telephone Encounter (Signed)
Per dr Laural Golden schedule - esophagogastroduodenoscopy with APC ablation of gastric antral vascular ectasia along with civilians flexible sigmoidoscopy. Left message to return call to discuss with pt.

## 2021-11-17 NOTE — Telephone Encounter (Signed)
Patient notified she needs procedures and would like to go ahead and schedule both for same day. Cannot do feb 23rd due to an appt at [facility]

## 2021-11-21 ENCOUNTER — Telehealth: Payer: Self-pay | Admitting: *Deleted

## 2021-11-21 NOTE — Telephone Encounter (Signed)
-----   Message from [name], [place] sent at 11/21/2021  2:18 PM EST ----- 2 excise, 3 mohs

## 2021-11-21 NOTE — Telephone Encounter (Signed)
Path to patient. Mohs referral sent to the skin surgery center. Patient has follow up in our office for more treatment.

## 2021-11-28 ENCOUNTER — Ambulatory Visit (INDEPENDENT_AMBULATORY_CARE_PROVIDER_SITE_OTHER): Payer: Medicare Other | Admitting: Physician Assistant

## 2021-11-28 ENCOUNTER — Encounter: Payer: Self-pay | Admitting: Physician Assistant

## 2021-11-28 ENCOUNTER — Other Ambulatory Visit: Payer: Self-pay

## 2021-11-28 DIAGNOSIS — C44729 Squamous cell carcinoma of skin of left lower limb, including hip: Secondary | ICD-10-CM | POA: Diagnosis not present

## 2021-11-28 DIAGNOSIS — D0471 Carcinoma in situ of skin of right lower limb, including hip: Secondary | ICD-10-CM

## 2021-11-28 DIAGNOSIS — L570 Actinic keratosis: Secondary | ICD-10-CM

## 2021-11-28 DIAGNOSIS — D485 Neoplasm of uncertain behavior of skin: Secondary | ICD-10-CM

## 2021-11-28 DIAGNOSIS — C44722 Squamous cell carcinoma of skin of right lower limb, including hip: Secondary | ICD-10-CM

## 2021-11-28 DIAGNOSIS — C44622 Squamous cell carcinoma of skin of right upper limb, including shoulder: Secondary | ICD-10-CM

## 2021-11-28 MED ORDER — MUPIROCIN 2 % EX OINT: 1.0000 "application " | TOPICAL_OINTMENT | Freq: Every day | CUTANEOUS | 3 refills | Status: AC | PRN

## 2021-11-28 NOTE — Patient Instructions (Signed)

## 2021-11-30 ENCOUNTER — Encounter: Payer: Self-pay | Admitting: Physician Assistant

## 2021-11-30 NOTE — Progress Notes (Signed)
Follow-Up Visit   Subjective  Cassidy Barber is a 71 y.o. female who presents for the following: Follow-up (Possible chest exc per patient). She has numerous small crusts that have popped up since we have seen her. They are painful.  Her excision site has healed well.    The following portions of the chart were reviewed this encounter and updated as appropriate:  Tobacco   Allergies   Meds   Problems   Med Hx   Surg Hx   Fam Hx       Objective  Well appearing patient in no apparent distress; mood and affect are within normal limits.  A full examination was performed including scalp, head, eyes, ears, nose, lips, neck, chest, axillae, abdomen, back, buttocks, bilateral upper extremities, bilateral lower extremities, hands, feet, fingers, toes, fingernails, and toenails. All findings within normal limits unless otherwise noted below.  Left Forearm - Posterior, Right Hand - Posterior (6) Erythematous patches with gritty scale.  Right Side Lower Leg Superior Volcano growth on pink base        Right Shin Volcano growth on pink base        Right Side Lower Leg Inferior Volcano growth on pink base        Right Medial Shin Volcano growth on pink base        Left Lower Outer Leg Hyperkeratotic scale with pink base        Right Shoulder - Anterior Hyperkeratotic scale with pink base         Assessment & Plan  AK (actinic keratosis) (7) Right Hand - Posterior (6); Left Forearm - Posterior  Destruction of lesion - Left Forearm - Posterior, Right Hand - Posterior Complexity: simple   Destruction method: cryotherapy   Informed consent: discussed and consent obtained   Timeout:  patient name, date of birth, surgical site, and procedure verified Lesion destroyed using liquid nitrogen: Yes   Region frozen until ice ball extended beyond lesion: Yes   Cryotherapy cycles:  3 Outcome: patient tolerated procedure well with no complications   Post-procedure  details: wound care instructions given    Carcinoma in situ of skin of right lower extremity including hip (2) Right Side Lower Leg Superior  Skin / nail biopsy Type of biopsy: tangential   Informed consent: discussed and consent obtained   Timeout: patient name, date of birth, surgical site, and procedure verified   Anesthesia: the lesion was anesthetized in a standard fashion   Anesthetic:  1% lidocaine w/ epinephrine 1-100,000 local infiltration Instrument used: flexible razor blade   Hemostasis achieved with: aluminum chloride and electrodesiccation   Outcome: patient tolerated procedure well   Post-procedure details: sterile dressing applied and wound care instructions given   Dressing type: pressure dressing and petrolatum    Destruction of lesion Complexity: simple   Destruction method: electrodesiccation and curettage   Informed consent: discussed and consent obtained   Timeout:  patient name, date of birth, surgical site, and procedure verified Anesthesia: the lesion was anesthetized in a standard fashion   Anesthetic:  1% lidocaine w/ epinephrine 1-100,000 local infiltration Curettage performed in three different directions: Yes   Electrodesiccation performed over the curetted area: Yes   Curettage cycles:  3 Margin per side (cm):  0.1 Final wound size (cm):  1.6 Hemostasis achieved with:  aluminum chloride and electrodesiccation Outcome: patient tolerated procedure well with no complications   Post-procedure details: sterile dressing applied and wound care instructions given   Dressing type:  petrolatum and pressure dressing   Additional details:  Wound innoculated with 5 fluorouracil solution.  Specimen 1 - Surgical pathology Differential Diagnosis: R/O BCC vs SCC - treatment after biopsy  Check Margins: yes  Right Shin  Skin / nail biopsy Type of biopsy: tangential   Informed consent: discussed and consent obtained   Timeout: patient name, date of birth,  surgical site, and procedure verified   Procedure prep:  Patient was prepped and draped in usual sterile fashion (Non sterile) Prep type:  Chlorhexidine Anesthesia: the lesion was anesthetized in a standard fashion   Anesthetic:  1% lidocaine w/ epinephrine 1-100,000 local infiltration Instrument used: flexible razor blade   Outcome: patient tolerated procedure well   Post-procedure details: sterile dressing applied and wound care instructions given   Dressing type: pressure dressing and petrolatum    Destruction of lesion Complexity: simple   Destruction method: electrodesiccation and curettage   Informed consent: discussed and consent obtained   Timeout:  patient name, date of birth, surgical site, and procedure verified Anesthesia: the lesion was anesthetized in a standard fashion   Anesthetic:  1% lidocaine w/ epinephrine 1-100,000 local infiltration Curettage performed in three different directions: Yes   Electrodesiccation performed over the curetted area: Yes   Curettage cycles:  3 Margin per side (cm):  0.1 Final wound size (cm):  1.8 Hemostasis achieved with:  aluminum chloride Outcome: patient tolerated procedure well with no complications   Post-procedure details: sterile dressing applied and wound care instructions given   Dressing type: pressure dressing and petrolatum   Additional details:  Wound inoculated with 5% fluorouracil solution  Specimen 4 - Surgical pathology Differential Diagnosis: R/O BCC vs SCC - treatment after biopsy  Check Margins: yes  SCC (squamous cell carcinoma), leg, right (2) Right Side Lower Leg Inferior  Skin / nail biopsy Type of biopsy: tangential   Informed consent: discussed and consent obtained   Timeout: patient name, date of birth, surgical site, and procedure verified   Anesthesia: the lesion was anesthetized in a standard fashion   Anesthetic:  1% lidocaine w/ epinephrine 1-100,000 local infiltration Instrument used: flexible  razor blade   Hemostasis achieved with: aluminum chloride and electrodesiccation   Outcome: patient tolerated procedure well   Post-procedure details: sterile dressing applied and wound care instructions given   Dressing type: pressure dressing    Destruction of lesion Complexity: simple   Destruction method: electrodesiccation and curettage   Informed consent: discussed and consent obtained   Timeout:  patient name, date of birth, surgical site, and procedure verified Anesthesia: the lesion was anesthetized in a standard fashion   Anesthetic:  1% lidocaine w/ epinephrine 1-100,000 local infiltration Curettage performed in three different directions: Yes   Electrodesiccation performed over the curetted area: Yes   Curettage cycles:  3 Margin per side (cm):  0.1 Final wound size (cm):  2.2 Hemostasis achieved with:  aluminum chloride and electrodesiccation Outcome: patient tolerated procedure well with no complications   Additional details:  Wound innoculated with 5 fluorouracil solution.  mupirocin ointment (BACTROBAN) 2 % Apply 1 application topically daily as needed (Cancer spots).  Specimen 2 - Surgical pathology Differential Diagnosis: R/O BCC vs SCC - treatment after biopsy  Check Margins: Yes  Right Medial Shin  Skin / nail biopsy Type of biopsy: tangential   Informed consent: discussed and consent obtained   Timeout: patient name, date of birth, surgical site, and procedure verified   Anesthesia: the lesion was anesthetized in a standard fashion  Anesthetic:  1% lidocaine w/ epinephrine 1-100,000 local infiltration Instrument used: flexible razor blade   Hemostasis achieved with: aluminum chloride and electrodesiccation   Outcome: patient tolerated procedure well   Post-procedure details: sterile dressing applied and wound care instructions given   Dressing type: pressure dressing    Destruction of lesion Complexity: simple   Destruction method:  electrodesiccation and curettage   Informed consent: discussed and consent obtained   Timeout:  patient name, date of birth, surgical site, and procedure verified Anesthesia: the lesion was anesthetized in a standard fashion   Anesthetic:  1% lidocaine w/ epinephrine 1-100,000 local infiltration Curettage performed in three different directions: Yes   Electrodesiccation performed over the curetted area: Yes   Curettage cycles:  3 Margin per side (cm):  0.1 Final wound size (cm):  1.1 Hemostasis achieved with:  aluminum chloride and electrodesiccation Outcome: patient tolerated procedure well with no complications   Post-procedure details: sterile dressing applied and wound care instructions given   Dressing type: petrolatum and pressure dressing   Additional details:  Wound innoculated with 5 fluorouracil solution.  mupirocin ointment (BACTROBAN) 2 % Apply 1 application topically daily as needed (Cancer spots).  Specimen 3 - Surgical pathology Differential Diagnosis: R/O BCC vs SCC - treatment after biopsy  Check Margins: yes  SCC (squamous cell carcinoma), leg, left Left Lower Outer Leg  Skin / nail biopsy Type of biopsy: tangential   Informed consent: discussed and consent obtained   Timeout: patient name, date of birth, surgical site, and procedure verified   Procedure prep:  Patient was prepped and draped in usual sterile fashion (Non sterile) Prep type:  Chlorhexidine Anesthesia: the lesion was anesthetized in a standard fashion   Anesthetic:  1% lidocaine w/ epinephrine 1-100,000 local infiltration Instrument used: flexible razor blade   Hemostasis achieved with: aluminum chloride   Outcome: patient tolerated procedure well   Post-procedure details: sterile dressing applied and wound care instructions given   Dressing type: pressure dressing    Destruction of lesion Complexity: simple   Destruction method: electrodesiccation and curettage   Informed consent:  discussed and consent obtained   Timeout:  patient name, date of birth, surgical site, and procedure verified Anesthesia: the lesion was anesthetized in a standard fashion   Anesthetic:  1% lidocaine w/ epinephrine 1-100,000 local infiltration Curettage performed in three different directions: Yes   Electrodesiccation performed over the curetted area: Yes   Curettage cycles:  3 Margin per side (cm):  0.1 Final wound size (cm):  1 Hemostasis achieved with:  aluminum chloride and electrodesiccation Outcome: patient tolerated procedure well with no complications   Post-procedure details: sterile dressing applied   Dressing type: petrolatum and pressure dressing   Additional details:  Wound innoculated with 5 fluorouracil solution.  Specimen 5 - Surgical pathology Differential Diagnosis: R/O BCC vs SCC - treatment after biopsy  Check Margins: yes  SCC (squamous cell carcinoma), shoulder, right Right Shoulder - Anterior  Skin / nail biopsy Type of biopsy: tangential   Informed consent: discussed and consent obtained   Timeout: patient name, date of birth, surgical site, and procedure verified   Anesthesia: the lesion was anesthetized in a standard fashion   Anesthetic:  1% lidocaine w/ epinephrine 1-100,000 local infiltration Instrument used: flexible razor blade   Hemostasis achieved with: aluminum chloride and electrodesiccation   Outcome: patient tolerated procedure well   Post-procedure details: sterile dressing applied and wound care instructions given   Dressing type: bandage and petrolatum  Destruction of lesion Complexity: simple   Destruction method: electrodesiccation and curettage   Informed consent: discussed and consent obtained   Timeout:  patient name, date of birth, surgical site, and procedure verified Anesthesia: the lesion was anesthetized in a standard fashion   Anesthetic:  1% lidocaine w/ epinephrine 1-100,000 local infiltration Curettage performed in  three different directions: Yes   Electrodesiccation performed over the curetted area: Yes   Curettage cycles:  3 Margin per side (cm):  0.1 Final wound size (cm):  1 Hemostasis achieved with:  aluminum chloride and electrodesiccation Outcome: patient tolerated procedure well with no complications   Additional details:  Wound innoculated with 5 fluorouracil solution.  Specimen 6 - Surgical pathology Differential Diagnosis: R/O BCC vs SCC - treatment after biopsy  Check Margins: yes    I, Kripa Foskey, PA-C, have reviewed all documentation's for this visit.  The documentation on 11/30/21 for the exam, diagnosis, procedures and orders are all accurate and complete.

## 2021-12-07 ENCOUNTER — Other Ambulatory Visit (INDEPENDENT_AMBULATORY_CARE_PROVIDER_SITE_OTHER): Payer: Self-pay

## 2021-12-07 DIAGNOSIS — K31819 Angiodysplasia of stomach and duodenum without bleeding: Secondary | ICD-10-CM

## 2021-12-08 ENCOUNTER — Encounter (INDEPENDENT_AMBULATORY_CARE_PROVIDER_SITE_OTHER): Payer: Self-pay

## 2021-12-12 ENCOUNTER — Inpatient Hospital Stay (HOSPITAL_COMMUNITY): Payer: Medicare Other | Attending: Hematology

## 2021-12-12 ENCOUNTER — Encounter (HOSPITAL_COMMUNITY): Payer: Self-pay

## 2021-12-13 NOTE — Patient Instructions (Signed)
Cassidy Barber  12/13/2021     @PREFPERIOPPHARMACY @   Your procedure is scheduled on 12/20/2021.  Report to [name] at 7:40 A.M.  Call this number if you have problems the morning of surgery:  [phone]   Remember:  Do not eat or drink after midnight.     Take these medicines the morning of surgery with A SIP OF WATER : Xanax, Pepcid, Metoprolol, Synthroid, Flexeril, Protonix, gabapentin, Zortress and Actigal    Do not wear jewelry, make-up or nail polish.  Do not wear lotions, powders, or perfumes, or deodorant.  Do not shave 48 hours prior to surgery.  Men may shave face and neck.  Do not bring valuables to the hospital.  [facility] is not responsible for any belongings or valuables.  Contacts, dentures or bridgework may not be worn into surgery.  Leave your suitcase in the car.  After surgery it may be brought to your room.  For patients admitted to the hospital, discharge time will be determined by your treatment team.  Patients discharged the day of surgery will not be allowed to drive home.   Name and phone number of your driver:   Family Special instructions:  N/A  Please read over the following fact sheets that you were given. Care and Recovery After Surgery  Flexible Sigmoidoscopy Flexible sigmoidoscopy is a procedure to check the health of your lower colon (sigmoid colon). The procedure is done using a short, flexible tube, called a sigmoidoscope, that has a small camera with a light on the end. The sigmoidoscope is inserted into the opening between the buttocks (anus) and passed through the rectum into the sigmoid colon. The camera on the scope sends images of the sigmoid colon and rectum to a TV monitor in the exam room. You may need this procedure if you have or have had: Changes in your bowel habits. Blood in your stool. Pain in your abdomen. Unexplained weight loss. You may also need this test if your health care provider wants to check for abnormal  growths in your rectum or sigmoid colon. Tell a health care provider about: Any allergies you have. All medicines you are taking, including vitamins, herbs, eye drops, creams, and over-the-counter medicines. Any problems you or family members have had with anesthetic medicines. Any blood disorders you have. Any surgeries you have had. Any medical conditions you have. Whether you are pregnant or may be pregnant. What are the risks? Generally, this is a safe procedure. However, problems may occur, including: Bleeding. A tear (perforation) through your rectum or colon. This is rare. Infection. This is rare. What happens before the procedure? Eating and drinking restrictions Follow instructions from your health care provider about eating or drinking restrictions. These instructions may include the following: Starting 24 hours before the procedure: Follow a clear liquid diet. This diet may include: Gelatin-based desserts. Clear liquids, such as water, clear juice, clear broth or bouillon, clear soft drinks or sports drinks, or coffee or tea without milk or cream. Avoid drinking liquids that contain red or purple dye 24 hours before the procedure. Do not eat solid foods. In the 2 hours before the procedure, or within the time period that your health care provider recommends, do not eat or drink anything. Bowel prep You will need to do a bowel prep the night before or the morning of the procedure. Follow instructions exactly as given by your health care provider. This is important. The bowel prep empties the colon of  stool so that your health care provider can have a clear view of your bowel during the procedure. The bowel prep may include one or more of the following: An enema. This is liquid medicine injected into your rectum. A suppository. This is a solid medicine inserted into your rectum. Laxative medicines. These will help you have a bowel movement. Laxatives come in different forms and  may be prescribed as: A pill that you swallow. A powder that you add to water and drink. A liquid laxative that you drink over a scheduled amount of time. Medicines Ask your health care provider about: Changing or stopping your regular medicines. This is especially important if you are taking iron supplements, diabetes medicines, or blood thinners. Taking medicines such as aspirin and ibuprofen. These medicines can thin your blood. Do not take these medicines unless your health care provider tells you to take them. Taking over-the-counter medicines, vitamins, herbs, and supplements. General instructions Ask your health care provider what steps will be taken to prevent infection. If you will be given a medicine to help you relax (sedative): Plan to have someone take you home from the hospital or clinic. Plan to have someone with you for 24 hours. What happens during the procedure?  An IV may be inserted into one of your veins. You may be given a sedative. You will lie on your side on the exam table. You may be asked to change positions during the procedure. The sigmoidoscope will have oil or gel applied to it (will be lubricated) and gently inserted into your anus. Air will be injected into your colon, and the sigmoidoscope will be moved through your rectum and into your sigmoid colon. You may feel some pressure or cramping. Images will appear on a monitor in the exam room. The images will be checked for any abnormal findings. A small tissue sample may be removed to be looked at later under a microscope (biopsy). If any small growths (polyps) are found, they may be removed and checked for cancer cells. The scope will be slowly removed. The procedure may vary among health care providers and hospitals. What happens after the procedure? Your blood pressure, heart rate, breathing rate, and blood oxygen level will be monitored until you leave the hospital or clinic. If you were given a sedative  during the procedure, it can affect you for several hours. Do not drive or operate machinery until your health care provider says that it is safe. It is up to you to get the results of your procedure. Ask your health care provider, or the department that is doing the procedure, when your results will be ready. Summary A flexible sigmoidoscopy is a procedure that looks at the lower part of the colon, known as the sigmoid colon. A flexible tube with a camera and light on the end is inserted into the opening between the buttocks, or anus, and then passed through the rectum into the sigmoid colon. The camera sends images of the sigmoid colon and rectum to a TV monitor. Follow instructions from your health care provider about eating and drinking before the procedure. Follow instructions from your health care provider for doing a bowel prep before the procedure. The bowel prep empties the colon of stool so that your health care provider can have a clear view of your bowel during the procedure. This information is not intended to replace advice given to you by your health care provider. Make sure you discuss any questions you have with your  health care provider. Document Revised: 10/26/2019 Document Reviewed: 10/26/2019 Elsevier Patient Education  2022 [employer]. Upper Endoscopy, Adult Upper endoscopy is a procedure to look inside the upper GI (gastrointestinal) tract. The upper GI tract is made up of: The part of the body that moves food from your mouth to your stomach (esophagus). The stomach. The first part of your small intestine (duodenum). This procedure is also called esophagogastroduodenoscopy (EGD) or gastroscopy. In this procedure, your health care provider passes a thin, flexible tube (endoscope) through your mouth and down your esophagus into your stomach. A small camera is attached to the end of the tube. Images from the camera appear on a monitor in the exam room. During this procedure,  your health care provider may also remove a small piece of tissue to be sent to a lab and examined under a microscope (biopsy). Your health care provider may do an upper endoscopy to diagnose cancers of the upper GI tract. You may also have this procedure to find the cause of other conditions, such as: Stomach pain. Heartburn. Pain or problems when swallowing. Nausea and vomiting. Stomach bleeding. Stomach ulcers. Tell a health care provider about: Any allergies you have. All medicines you are taking, including vitamins, herbs, eye drops, creams, and over-the-counter medicines. Any problems you or family members have had with anesthetic medicines. Any blood disorders you have. Any surgeries you have had. Any medical conditions you have. Whether you are pregnant or may be pregnant. What are the risks? Generally, this is a safe procedure. However, problems may occur, including: Infection. Bleeding. Allergic reactions to medicines. A tear or hole (perforation) in the esophagus, stomach, or duodenum. What happens before the procedure? Staying hydrated Follow instructions from your health care provider about hydration, which may include: Up to 2 hours before the procedure - you may continue to drink clear liquids, such as water, clear fruit juice, black coffee, and plain tea.  Eating and drinking restrictions Follow instructions from your health care provider about eating and drinking, which may include: 8 hours before the procedure - stop eating heavy meals or foods, such as meat, fried foods, or fatty foods. 6 hours before the procedure - stop eating light meals or foods, such as toast or cereal. 6 hours before the procedure - stop drinking milk or drinks that contain milk. 2 hours before the procedure - stop drinking clear liquids. Medicines Ask your health care provider about: Changing or stopping your regular medicines. This is especially important if you are taking diabetes  medicines or blood thinners. Taking medicines such as aspirin and ibuprofen. These medicines can thin your blood. Do not take these medicines unless your health care provider tells you to take them. Taking over-the-counter medicines, vitamins, herbs, and supplements. General instructions Plan to have someone take you home from the hospital or clinic. If you will be going home right after the procedure, plan to have someone with you for 24 hours. Ask your health care provider what steps will be taken to help prevent infection. What happens during the procedure?  An IV will be inserted into one of your veins. You may be given one or more of the following: A medicine to help you relax (sedative). A medicine to numb the throat (local anesthetic). You will lie on your left side on an exam table. Your health care provider will pass the endoscope through your mouth and down your esophagus. Your health care provider will use the scope to check the inside of your  esophagus, stomach, and duodenum. Biopsies may be taken. The endoscope will be removed. The procedure may vary among health care providers and hospitals. What happens after the procedure? Your blood pressure, heart rate, breathing rate, and blood oxygen level will be monitored until you leave the hospital or clinic. Do not drive for 24 hours if you were given a sedative during your procedure. When your throat is no longer numb, you may be given some fluids to drink. It is up to you to get the results of your procedure. Ask your health care provider, or the department that is doing the procedure, when your results will be ready. Summary Upper endoscopy is a procedure to look inside the upper GI tract. During the procedure, an IV will be inserted into one of your veins. You may be given a medicine to help you relax. A medicine will be used to numb your throat. The endoscope will be passed through your mouth and down your esophagus. This  information is not intended to replace advice given to you by your health care provider. Make sure you discuss any questions you have with your health care provider. Document Revised: 04/23/2018 Document Reviewed: 03/31/2018 Elsevier Patient Education  [place].

## 2021-12-14 ENCOUNTER — Encounter (HOSPITAL_COMMUNITY): Payer: Self-pay

## 2021-12-14 ENCOUNTER — Other Ambulatory Visit: Payer: Self-pay

## 2021-12-14 ENCOUNTER — Encounter (HOSPITAL_COMMUNITY)
Admission: RE | Admit: 2021-12-14 | Discharge: 2021-12-14 | Disposition: A | Discharge status: Home / Self Care | Payer: Medicare Other | Source: Ambulatory Visit | Attending: Internal Medicine | Admitting: Internal Medicine

## 2021-12-14 ENCOUNTER — Inpatient Hospital Stay (HOSPITAL_COMMUNITY): Payer: Medicare Other | Attending: Hematology

## 2021-12-14 DIAGNOSIS — D509 Iron deficiency anemia, unspecified: Secondary | ICD-10-CM

## 2021-12-14 DIAGNOSIS — D500 Iron deficiency anemia secondary to blood loss (chronic): Secondary | ICD-10-CM | POA: Insufficient documentation

## 2021-12-14 DIAGNOSIS — K922 Gastrointestinal hemorrhage, unspecified: Secondary | ICD-10-CM | POA: Insufficient documentation

## 2021-12-14 DIAGNOSIS — Z944 Liver transplant status: Secondary | ICD-10-CM | POA: Insufficient documentation

## 2021-12-14 DIAGNOSIS — Z85038 Personal history of other malignant neoplasm of large intestine: Secondary | ICD-10-CM | POA: Diagnosis not present

## 2021-12-14 LAB — CBC WITH DIFFERENTIAL/PLATELET
Abs Immature Granulocytes: 0.02 10*3/uL (ref 0.00–0.07)
Basophils Absolute: 0.1 10*3/uL (ref 0.0–0.1)
Basophils Relative: 1 %
Eosinophils Absolute: 0.3 10*3/uL (ref 0.0–0.5)
Eosinophils Relative: 4 %
HCT: 40.5 % (ref 36.0–46.0)
Hemoglobin: 11.9 g/dL — ABNORMAL LOW (ref 12.0–15.0)
Immature Granulocytes: 0 %
Lymphocytes Relative: 24 %
Lymphs Abs: 1.6 10*3/uL (ref 0.7–4.0)
MCH: 26.9 pg (ref 26.0–34.0)
MCHC: 29.4 g/dL — ABNORMAL LOW (ref 30.0–36.0)
MCV: 91.4 fL (ref 80.0–100.0)
Monocytes Absolute: 0.9 10*3/uL (ref 0.1–1.0)
Monocytes Relative: 13 %
Neutro Abs: 3.7 10*3/uL (ref 1.7–7.7)
Neutrophils Relative %: 58 %
Platelets: 304 10*3/uL (ref 150–400)
RBC: 4.43 MIL/uL (ref 3.87–5.11)
RDW: 15.9 % — ABNORMAL HIGH (ref 11.5–15.5)
WBC: 6.5 10*3/uL (ref 4.0–10.5)
nRBC: 0 % (ref 0.0–0.2)

## 2021-12-14 LAB — IRON AND TIBC
Iron: 32 ug/dL (ref 28–170)
Saturation Ratios: 10 % — ABNORMAL LOW (ref 10.4–31.8)
TIBC: 320 ug/dL (ref 250–450)
UIBC: 288 ug/dL

## 2021-12-14 LAB — FERRITIN: Ferritin: 88 ng/mL (ref 11–307)

## 2021-12-15 ENCOUNTER — Inpatient Hospital Stay (HOSPITAL_COMMUNITY): Payer: Medicare Other

## 2021-12-18 DIAGNOSIS — Z944 Liver transplant status: Secondary | ICD-10-CM | POA: Diagnosis not present

## 2021-12-20 ENCOUNTER — Encounter (HOSPITAL_COMMUNITY)
Admission: RE | Disposition: A | Discharge status: Home / Self Care | Payer: Self-pay | Source: Ambulatory Visit | Attending: Internal Medicine

## 2021-12-20 ENCOUNTER — Ambulatory Visit (HOSPITAL_COMMUNITY): Payer: Medicare Other | Admitting: Anesthesiology

## 2021-12-20 ENCOUNTER — Ambulatory Visit (HOSPITAL_COMMUNITY)
Admission: RE | Admit: 2021-12-20 | Discharge: 2021-12-20 | Disposition: A | Discharge status: Home / Self Care | Payer: Medicare Other | Source: Ambulatory Visit | Attending: Internal Medicine | Admitting: Internal Medicine

## 2021-12-20 ENCOUNTER — Other Ambulatory Visit: Payer: Self-pay

## 2021-12-20 ENCOUNTER — Encounter (HOSPITAL_COMMUNITY): Payer: Self-pay | Admitting: Internal Medicine

## 2021-12-20 DIAGNOSIS — D500 Iron deficiency anemia secondary to blood loss (chronic): Secondary | ICD-10-CM | POA: Insufficient documentation

## 2021-12-20 DIAGNOSIS — K317 Polyp of stomach and duodenum: Secondary | ICD-10-CM | POA: Insufficient documentation

## 2021-12-20 DIAGNOSIS — R569 Unspecified convulsions: Secondary | ICD-10-CM | POA: Diagnosis not present

## 2021-12-20 DIAGNOSIS — Z980 Intestinal bypass and anastomosis status: Secondary | ICD-10-CM | POA: Diagnosis not present

## 2021-12-20 DIAGNOSIS — Z85038 Personal history of other malignant neoplasm of large intestine: Secondary | ICD-10-CM | POA: Diagnosis not present

## 2021-12-20 DIAGNOSIS — Z944 Liver transplant status: Secondary | ICD-10-CM | POA: Insufficient documentation

## 2021-12-20 DIAGNOSIS — K31819 Angiodysplasia of stomach and duodenum without bleeding: Secondary | ICD-10-CM | POA: Diagnosis not present

## 2021-12-20 DIAGNOSIS — R300 Dysuria: Secondary | ICD-10-CM

## 2021-12-20 DIAGNOSIS — K219 Gastro-esophageal reflux disease without esophagitis: Secondary | ICD-10-CM | POA: Insufficient documentation

## 2021-12-20 DIAGNOSIS — K633 Ulcer of intestine: Secondary | ICD-10-CM | POA: Insufficient documentation

## 2021-12-20 DIAGNOSIS — R195 Other fecal abnormalities: Secondary | ICD-10-CM | POA: Insufficient documentation

## 2021-12-20 DIAGNOSIS — F419 Anxiety disorder, unspecified: Secondary | ICD-10-CM | POA: Diagnosis not present

## 2021-12-20 DIAGNOSIS — K3189 Other diseases of stomach and duodenum: Secondary | ICD-10-CM | POA: Insufficient documentation

## 2021-12-20 DIAGNOSIS — K6289 Other specified diseases of anus and rectum: Secondary | ICD-10-CM | POA: Diagnosis not present

## 2021-12-20 DIAGNOSIS — K626 Ulcer of anus and rectum: Secondary | ICD-10-CM | POA: Diagnosis not present

## 2021-12-20 DIAGNOSIS — K743 Primary biliary cirrhosis: Secondary | ICD-10-CM | POA: Insufficient documentation

## 2021-12-20 DIAGNOSIS — D509 Iron deficiency anemia, unspecified: Secondary | ICD-10-CM | POA: Diagnosis not present

## 2021-12-20 DIAGNOSIS — I10 Essential (primary) hypertension: Secondary | ICD-10-CM | POA: Diagnosis not present

## 2021-12-20 DIAGNOSIS — K644 Residual hemorrhoidal skin tags: Secondary | ICD-10-CM | POA: Diagnosis not present

## 2021-12-20 DIAGNOSIS — D132 Benign neoplasm of duodenum: Secondary | ICD-10-CM | POA: Insufficient documentation

## 2021-12-20 DIAGNOSIS — Z1509 Genetic susceptibility to other malignant neoplasm: Secondary | ICD-10-CM | POA: Insufficient documentation

## 2021-12-20 HISTORY — PX: FLEXIBLE SIGMOIDOSCOPY: SHX5431

## 2021-12-20 HISTORY — PX: HOT HEMOSTASIS: SHX5433

## 2021-12-20 HISTORY — PX: ESOPHAGOGASTRODUODENOSCOPY (EGD) WITH PROPOFOL: SHX5813

## 2021-12-20 HISTORY — PX: BIOPSY: SHX5522

## 2021-12-20 SURGERY — ESOPHAGOGASTRODUODENOSCOPY (EGD) WITH PROPOFOL
Anesthesia: General

## 2021-12-20 MED ORDER — PROPOFOL 500 MG/50ML IV EMUL
INTRAVENOUS | Status: DC | PRN
  Administered 2021-12-20: 150 ug/kg/min via INTRAVENOUS

## 2021-12-20 MED ORDER — LIDOCAINE HCL 1 % IJ SOLN
INTRAMUSCULAR | Status: DC | PRN
  Administered 2021-12-20: 50 mg via INTRADERMAL

## 2021-12-20 MED ORDER — PROPOFOL 10 MG/ML IV BOLUS
INTRAVENOUS | Status: DC | PRN
  Administered 2021-12-20: 30 mg via INTRAVENOUS
  Administered 2021-12-20: 40 mg via INTRAVENOUS
  Administered 2021-12-20: 50 mg via INTRAVENOUS
  Administered 2021-12-20: 5 mg via INTRAVENOUS

## 2021-12-20 MED ORDER — LACTATED RINGERS IV SOLN: INTRAVENOUS | Status: DC

## 2021-12-20 NOTE — Discharge Instructions (Signed)
Resume aspirin on 12/23/2021 Resume other medications and diet as before. No driving for 24 hours. Physician will call with biopsy results.

## 2021-12-20 NOTE — Anesthesia Postprocedure Evaluation (Signed)
Anesthesia Post Note  Patient: Cassidy Barber  Procedure(s) Performed: ESOPHAGOGASTRODUODENOSCOPY (EGD) WITH PROPOFOL HOT HEMOSTASIS (ARGON PLASMA COAGULATION/BICAP) FLEXIBLE SIGMOIDOSCOPY BIOPSY  Patient location during evaluation: Short Stay Anesthesia Type: General Level of consciousness: awake and alert Pain management: pain level controlled Vital Signs Assessment: post-procedure vital signs reviewed and stable Respiratory status: spontaneous breathing Cardiovascular status: blood pressure returned to baseline and stable Postop Assessment: no apparent nausea or vomiting Anesthetic complications: no   No notable events documented.   Last Vitals:  Vitals:   12/20/21 0822  BP: 136/81  Pulse: 74  Resp: 13  Temp: 36.6 C  SpO2: 99%    Last Pain:  Vitals:   12/20/21 0924  TempSrc:   PainSc: 0-No pain                 Adrien Shankar

## 2021-12-20 NOTE — Transfer of Care (Signed)
Immediate Anesthesia Transfer of Care Note  Patient: Cassidy Barber  Procedure(s) Performed: ESOPHAGOGASTRODUODENOSCOPY (EGD) WITH PROPOFOL HOT HEMOSTASIS (ARGON PLASMA COAGULATION/BICAP) FLEXIBLE SIGMOIDOSCOPY BIOPSY  Patient Location: Short Stay  Anesthesia Type:General  Level of Consciousness: awake  Airway & Oxygen Therapy: Patient Spontanous Breathing  Post-op Assessment: Report given to RN  Post vital signs: Reviewed  Last Vitals:  Vitals Value Taken Time  BP    Temp    Pulse    Resp    SpO2      Last Pain:  Vitals:   12/20/21 0924  TempSrc:   PainSc: 0-No pain         Complications: No notable events documented.

## 2021-12-20 NOTE — H&P (Signed)
Cassidy Barber is an 71 y.o. female.   Chief Complaint: Patient is here for esophagogastroduodenoscopy and flexible sigmoidoscopy. HPI: Patient is 71 year old Caucasian female with multiple medical problems including history of colon carcinoma and she carries gene for Lynch syndrome as well as primary biliary cholangitis status post live donor liver transplant 8 years ago who also has chronic GERD maintained on PPI with satisfactory control of her symptoms.  She has iron deficiency anemia rectal and requires iron infusion periodically.  She was seen in the office couple months ago and Hemoccults were done and all 3 were positive.  No history of melena or rectal bleeding.  On her last EGD she did have mild gastric antral vascular ectasia.  If GAVE is is felt to be source of bleeding these lesions will be ablated with APC.  She remains with 3-4 bowel movements per day.  Last sigmoidoscopy was in April 2022.  Past Medical History:  Diagnosis Date   Abdominal wall hernia    Incarcerated status post surgical repair 2019 - [facility]   Anemia of chronic disease    Atypical nevus 01/21/2018   atypical neoplasm- Left scalp-ant (txpbx + MOHS), atypical neoplasm- Left scalp post- (txpbx + MOHS)   Basal cell carcinoma    Colon cancer ([place])    Colon surgery 2005 and 2012   History of pulmonary hypertension    Pre liver transplant   Hypertension    Hypothyroidism    Lynch syndrome    Osteopenia    Primary biliary cirrhosis ([place])    Status post liver transplantation - follows at [place] (squamous cell carcinoma) of skin 02/23/2020   Right Upper Chest(moderate) (MOH's)   SCCA (squamous cell carcinoma) of skin 04/07/2020   Left Top Leg (Keratoacanthoma) treatment after biopsy   SCCA (squamous cell carcinoma) of skin 04/07/2020   Left Foot Dorsal (in situ) treatment after biopsy   SCCA (squamous cell carcinoma) of skin 03/27/2021   Right Upper Back (Keratoacanthoma) (excision) (clear)   SCCA (squamous cell  carcinoma) of skin 06/13/2021   Right Shoulder - anterior (moderately differentiated) (tx p bx)   SCCA (squamous cell carcinoma) of skin 06/13/2021   Right Thigh - anterior (well differentiated) (tx p bx)   SCCA (squamous cell carcinoma) of skin 06/13/2021   Right Lower Leg - anterior (well differentiated) (tx p bx)   Squamous cell carcinoma of skin 04/22/2018   KA-Right mid chest (txpbx), KA-left elbow crease (txpbx), insitu-Right mid chest inf. (exc)   Squamous cell carcinoma of skin 05/20/2018   well diff-Left upper shin (txpbx), well diff-Right lower forearm (txpbx), well diff-Right upper shin (txpbx)   Squamous cell carcinoma of skin 06/11/2018   Scc + margin-Right mid chest inferior    Squamous cell carcinoma of skin 06/27/2018   well diff-Left mid thigh(txpbx), well diff-Left inner thigh (txpbx),insitu-Right cheek (txpbx),well diff-right inner heel (txpbx)   Squamous cell carcinoma of skin 09/16/2018   well diff-left shoulder (txpbx), well diff-Right chin (txpbx), well diff-right chest lateral (CX35FU)   Squamous cell carcinoma of skin 10/01/2018   Right outer lower shin (Txpbx)   Squamous cell carcinoma of skin 04/03/2019   well diff-Right center chest (MOHS), in situ-Right ear   Squamous cell carcinoma of skin 08/05/2019   in situ-Left calf (txpbx), in situ-left bicep (txpbx), well diff-Left chest,inf(txpbx), in situ-Right chest inf-(txpbx)   Squamous cell carcinoma of skin 11/19/2019   KA-left top leg (txpbx), modify-Riight forehead-(mohs), in situ-right hand (txpbx), in situ-Right forearm (txpbx),  well diff-Right chest (txpbx), well diff-chin (txpbx)   Squamous cell carcinoma of skin 01/06/2020   KA- Left top leg   Squamous cell carcinoma of skin 05/12/2013   bowens-middle of chest (CX35FU)   Squamous cell carcinoma of skin 05/18/2015   well diff-Left upper arm (CX35FU + Exc),KA-right chest(txpbx), in situ-Left shin (txpbx), well diff-Right cheek (CX35FU), KA-Left post scalp  (CX35FU)   Squamous cell carcinoma of skin 08/09/2015   KA-Left post scalp ((MOHS), in situ- mid chest (Txpbx +exc), in situ-Left upper arm inferior (txpbx)   Squamous cell carcinoma of skin 10/13/2015   Left upper arm-clear   Squamous cell carcinoma of skin 03/09/2016   mod diff-mid chest (txpbx+ exc), mod diff-Right chest (txpbx+exc), well diff-right cheek-(txpbx),well diff-Left hand-(txpbx), in situ-Left upper arm (txpbx), well diff-Right cheek -(txpbx), well diff-Right crease arm (txpbx)    Squamous cell carcinoma of skin 05/24/2016   well diff-Right nasal crease-(MOHS)   Squamous cell carcinoma of skin 08/02/2016   KA-Left chest med (txpbx)   Squamous cell carcinoma of skin 08/30/2016   in situ-Left outer zygoma (txpbx)   Squamous cell carcinoma of skin 12/06/2016   well diff-Left chest sup, Left shoudler, insitu- right post scalp   Squamous cell carcinoma of skin 02/14/2017   well diff-Left forearm (EXC),in situ-RIght ant neck   Squamous cell carcinoma of skin 06/14/2017   in situ-Right forearm (txpbx), in situ-Right chest (txpbx), well diff-left chest (txpbx), well diff-anterior neck- (txpbx)   Squamous cell carcinoma of skin 08/07/2017   well diff-Left upper shoulder (txpbx), sup and invasive-Left temple (txpbx), well diff-Right upper shin (txpbx), in situ-Right clavicle (txpbx)   Squamous cell carcinoma of skin 10/17/2017   well diff-ant. neck (MOHS), in situ-Right chest, inf (txpbx)   Squamous cell carcinoma of skin 01/21/2018   well diff- Right chest,ulnar (txpbx), well diff- right upper chest (txpbx), in situ-Right ant. crown (txpbx)   Squamous cell carcinoma of skin 08/02/2020   well diff-left lower leg-inferior (Txpbx)   Squamous cell carcinoma of skin 08/02/2020   well diff-right lower leg-mid (txpbx)   Squamous cell carcinoma of skin 08/02/2020   well diff-left chest upper   Squamous cell carcinoma of skin 08/02/2020   well diff-mid parietal scalp (MOHS)   Squamous  cell carcinoma of skin 08/02/2020   well diff-right foot inner(txpbx)   Squamous cell carcinoma of skin 08/02/2020   well diff- left lower leg medial (txpbx)   Squamous cell carcinoma of skin 08/02/2020   well diff-left lower leg anterior (txpbx)   Squamous cell carcinoma of skin 08/02/2020   well diff-left lower leg medial (txpbx)   Squamous cell carcinoma of skin 08/02/2020   well diff-right forearm-posterior (txpbx)    Past Surgical History:  Procedure Laterality Date   ABDOMINAL HERNIA REPAIR     Patient's states that she has had 8- 9 hernia surgeries   ABDOMINAL HYSTERECTOMY     BIOPSY  02/15/2021   Procedure: BIOPSY;  Surgeon: Rogene Houston, MD;  [facility];  Service: Endoscopy;;   CATARACT EXTRACTION W/PHACO Right 01/15/2020   Procedure: CATARACT EXTRACTION PHACO AND INTRAOCULAR LENS PLACEMENT ([place]);  Surgeon: Baruch Goldmann, MD;  Location: [facility];  Service: Ophthalmology;  Laterality: Right;  CDE: 7.89   CATARACT EXTRACTION W/PHACO Left 01/29/2020   Procedure: CATARACT EXTRACTION PHACO AND INTRAOCULAR LENS PLACEMENT ([facility]) (CDE: 6.33);  Surgeon: Baruch Goldmann, MD;  Location: [facility];  Service: Ophthalmology;  Laterality: Left;   CHOLECYSTECTOMY  2007   COLON SURGERY  2008   [facility] at [facility]   COLONOSCOPY     Done at [facility]   ESOPHAGOGASTRODUODENOSCOPY N/A 08/21/2018   Procedure: ESOPHAGOGASTRODUODENOSCOPY (EGD);  Surgeon: Rogene Houston, MD;  [facility];  Service: Endoscopy;  Laterality: N/A;   ESOPHAGOGASTRODUODENOSCOPY (EGD) WITH PROPOFOL N/A 12/16/2019   Procedure: ESOPHAGOGASTRODUODENOSCOPY (EGD) WITH PROPOFOL;  Surgeon: Rogene Houston, MD;  [facility];  Service: Endoscopy;  Laterality: N/A;   EYE SURGERY     lasix   FLEXIBLE SIGMOIDOSCOPY N/A 10/20/2015   Procedure: FLEXIBLE SIGMOIDOSCOPY;  Surgeon: Rogene Houston, MD;  [facility];  Service: Endoscopy;  Laterality: N/A;  98 - Dr Laural Golden has meeting until 1:00    FLEXIBLE SIGMOIDOSCOPY N/A 07/11/2016   Procedure: FLEXIBLE SIGMOIDOSCOPY;  Surgeon: Rogene Houston, MD;  [facility];  Service: Endoscopy;  Laterality: N/A;  Kelly N/A 08/09/2017   Procedure: FLEXIBLE SIGMOIDOSCOPY;  Surgeon: Rogene Houston, MD;  [facility];  Service: Endoscopy;  Laterality: N/A;  1:00   FLEXIBLE SIGMOIDOSCOPY N/A 08/21/2018   Procedure: FLEXIBLE SIGMOIDOSCOPY;  Surgeon: Rogene Houston, MD;  [facility];  Service: Endoscopy;  Laterality: N/A;   FLEXIBLE SIGMOIDOSCOPY N/A 12/16/2019   Procedure: FLEXIBLE SIGMOIDOSCOPY wirh Propofol;  Surgeon: Rogene Houston, MD;  [facility];  Service: Endoscopy;  Laterality: N/A;  7:30   FLEXIBLE SIGMOIDOSCOPY N/A 02/15/2021   Procedure: FLEXIBLE SIGMOIDOSCOPY WITH PROPOFOL;  Surgeon: Rogene Houston, MD;  [facility];  Service: Endoscopy;  Laterality: N/A;  am   FRACTURE SURGERY     right wrist metal plate   LIVER TRANSPLANT  11/19/2013   POLYPECTOMY  08/09/2017   Procedure: POLYPECTOMY;  Surgeon: Rogene Houston, MD;  [facility];  Service: Endoscopy;;  colon small bowel   REVERSE SHOULDER ARTHROPLASTY Left 07/17/2018   Procedure: LEFT REVERSE SHOULDER ARTHROPLASTY;  Surgeon: Justice Britain, MD;  Location: [place];  Service: Orthopedics;  Laterality: Left;  137min   REVERSE SHOULDER ARTHROPLASTY Right 07/20/2021   Procedure: REVERSE SHOULDER ARTHROPLASTY;  Surgeon: Justice Britain, MD;  [facility];  Service: Orthopedics;  Laterality: Right;   SHOULDER CLOSED REDUCTION Left 09/27/2019   Procedure: CLOSED REDUCTION SHOULDER;  Surgeon: Paralee Cancel, MD;  Location[facility];  Service: Orthopedics;  Laterality: Left;   SPLENECTOMY  2006   TOTAL SHOULDER REVISION Left 11/12/2019   Procedure: Revision Left Reverse Shoulder Arthroplasty with poly exchange SDD;  Surgeon: Justice Britain, MD;  Location: [facility];  Service: Orthopedics;  Laterality:  Left;  174min -SDDC   TYMPANOSTOMY TUBE PLACEMENT     UPPER GASTROINTESTINAL ENDOSCOPY     Done at [facility]    Family History  Problem Relation Age of Onset   Prostate cancer Father    Colon cancer Father    Colon cancer Sister    Lung cancer Sister    Healthy Son    Alcohol abuse Brother    Allergic rhinitis Neg Hx    Asthma Neg Hx    Eczema Neg Hx    Urticaria Neg Hx    Social History:  reports that she has never smoked. She has never used smokeless tobacco. She reports that she does not drink alcohol and does not use drugs.  Allergies:  Allergies  Allergen Reactions   Ciprofloxacin Itching   Codeine Nausea Only    Medications Prior to Admission  Medication Sig Dispense Refill   acetaminophen (  TYLENOL) 500 MG tablet Take 1,000 mg by mouth every 6 (six) hours as needed (for pain.).      alendronate (FOSAMAX) 70 MG tablet TAKE 1 TABLET EVERY WEEK 12 tablet 2   ALPRAZolam (XANAX) 0.5 MG tablet Take 1 tablet (0.5 mg total) by mouth at bedtime. 90 tablet 1   aspirin EC 81 MG tablet Take 81 mg by mouth daily.     Biotin 1000 MCG tablet Take 1,000 mcg by mouth 3 (three) times daily.     Biotin w/ Vitamins C & E (HAIR SKIN & NAILS GUMMIES PO) Take 2 tablets by mouth daily.     Calcium Citrate-Vitamin D (CALCIUM CITRATE + D3 PO) Take 1 tablet by mouth 2 (two) times daily.     cetirizine (ZYRTEC) 10 MG tablet TAKE 1 TO 2 TABLETS TWICE DAILY (Patient taking differently: Take 10 mg by mouth daily as needed for allergies.) 120 tablet 5   clotrimazole-betamethasone (LOTRISONE) cream Apply 1 application topically 2 (two) times daily. (Patient taking differently: Apply 1 application topically daily as needed (Rash).) 30 g 0   Cyanocobalamin (VITAMIN B-12) 5000 MCG TBDP Take 5,000 mcg by mouth 2 (two) times a week.     cyclobenzaprine (FLEXERIL) 10 MG tablet Take 1 tablet (10 mg total) by mouth 3 (three) times daily as needed for muscle spasms. 30 tablet 1   famotidine (PEPCID) 20 MG tablet Take  1 tablet (20 mg total) by mouth 2 (two) times daily. 60 tablet 5   fluorouracil (EFUDEX) 5 % cream Apply 1 application topically daily as needed (cancer spots).   0   gabapentin (NEURONTIN) 100 MG capsule Take 1 capsule (100 mg total) by mouth daily. 90 capsule 2   levothyroxine (SYNTHROID) 112 MCG tablet Take 1 tablet (112 mcg total) by mouth daily. 90 tablet 4   losartan (COZAAR) 25 MG tablet Take 1 tablet (25 mg total) by mouth daily. 90 tablet 4   metoprolol succinate (TOPROL XL) 25 MG 24 hr tablet Take 1 tablet (25 mg total) by mouth daily. 90 tablet 4   Multiple Vitamins-Minerals (WOMENS MULTIVITAMIN) TABS Take 1 tablet by mouth daily.     mupirocin ointment (BACTROBAN) 2 % Apply 1 application topically daily as needed (Cancer spots). 22 g 3   NIACINAMIDE-ZINC-FOLIC ACID PO Take 2 tablets by mouth 2 (two) times daily.     pantoprazole (PROTONIX) 40 MG tablet Take 1 tablet (40 mg total) by mouth every morning. 90 tablet 3   ursodiol (ACTIGALL) 250 MG tablet Take 250 mg by mouth 2 (two) times daily.      Vitamin D, Ergocalciferol, (DRISDOL) 1.25 MG (50000 UNIT) CAPS capsule Take 1 capsule (50,000 Units total) by mouth once a week. 12 capsule 3   vitamin E 180 MG (400 UNITS) capsule Take 400 Units by mouth daily.     ZORTRESS 0.5 MG TABS Take 4 tablets (2 mg total) by mouth 2 (two) times daily. 180 tablet 2    No results found for this or any previous visit (from the past 48 hour(s)). No results found.  Review of Systems  Blood pressure 136/81, pulse 74, temperature 97.8 F (36.6 C), temperature source Oral, resp. rate 13, height 5\' 3"  (1.6 m), weight 65.8 kg, SpO2 99 %. Physical Exam HENT:     Mouth/Throat:     Mouth: Mucous membranes are moist.     Pharynx: Oropharynx is clear.  Eyes:     General: No scleral icterus.    Conjunctiva/sclera:  Conjunctivae normal.  Cardiovascular:     Rate and Rhythm: Normal rate and regular rhythm.     Heart sounds: Normal heart sounds. No murmur  heard. Pulmonary:     Effort: Pulmonary effort is normal.     Breath sounds: Normal breath sounds.  Abdominal:     Comments: Abdomen is symmetrical with extensive scarring.  On palpation it is soft and nontender with organomegaly or masses.  Musculoskeletal:        General: No swelling.     Cervical back: Neck supple.  Lymphadenopathy:     Cervical: No cervical adenopathy.  Skin:    General: Skin is warm and dry.  Neurological:     Mental Status: She is alert.     Assessment/Plan  Iron deficiency anemia. Heme positive stools. History of colon cancer and Lynch syndrome. Diagnostic esophagogastroduodenoscopy and flexible sigmoidoscopy.  Hildred Laser, MD 12/20/2021, 9:09 AM

## 2021-12-20 NOTE — Op Note (Signed)
[facility] Patient Name: Cassidy Barber Procedure Date: 12/20/2021 8:58 AM MRN: [id number] Date of Birth: [birth year] Attending MD: Hildred Laser , MD CSN: [id number] Age: 71 Admit Type: Outpatient Procedure:                Upper GI endoscopy Indications:              Iron deficiency anemia secondary to chronic blood                            loss Providers:                Hildred Laser, MD, Rosina Lowenstein, RN, Hughie Closs                            RN, RN, Randa Spike, Technician Referring MD:             [name], FNP Medicines:                Propofol per Anesthesia Complications:            No immediate complications. Estimated Blood Loss:     Estimated blood loss was minimal. Procedure:                Pre-Anesthesia Assessment:                           - Prior to the procedure, a History and Physical                            was performed, and patient medications and                            allergies were reviewed. The patient's tolerance of                            previous anesthesia was also reviewed. The risks                            and benefits of the procedure and the sedation                            options and risks were discussed with the patient.                            All questions were answered, and informed consent                            was obtained. Prior Anticoagulants: The patient has                            taken no previous anticoagulant or antiplatelet                            agents except for aspirin. ASA Grade Assessment:  III - A patient with severe systemic disease. After                            reviewing the risks and benefits, the patient was                            deemed in satisfactory condition to undergo the                            procedure.                           After obtaining informed consent, the endoscope was                            passed under direct vision.  Throughout the                            procedure, the patient's blood pressure, pulse, and                            oxygen saturations were monitored continuously. The                            [id number]-H190 ([id number]) scope was introduced through the                            mouth, and advanced to the second part of duodenum.                            The upper GI endoscopy was accomplished without                            difficulty. The patient tolerated the procedure                            well. Scope In: 9:28:45 AM Scope Out: 9:40:23 AM Total Procedure Duration: 0 hours 11 minutes 38 seconds  Findings:      The hypopharynx was normal.      The examined esophagus was normal.      The Z-line was regular and was found 40 cm from the incisors.      Moderate gastric antral vascular ectasia without bleeding was present in       the gastric antrum and in the prepyloric region of the stomach.       Coagulation for bleeding prevention using argon plasma was successful.      The exam of the stomach was otherwise normal.      A single 10 mm semi-sessile polyp with no bleeding was found in the       duodenal bulb. Biopsies were taken with a cold forceps for histology.       The pathology specimen was placed into Bottle Number 1.      Mildly erythematous mucosa and with no stigmata of bleeding was found in       the second portion of the duodenum. Impression:               -  Normal hypopharynx.                           - Normal esophagus.                           - Z-line regular, 40 cm from the incisors.                           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                            Treated with argon plasma coagulation (APC). At                            least 80% the lesions were ablated.                           - A single duodenal polyp. Biopsied.                           - Erythematous duodenopathy.                           comment: Polyp biopsied  for histology. If it is an                            adenoma it would be snared at a later date. Moderate Sedation:      Per Anesthesia Care Recommendation:           - Patient has a contact number available for                            emergencies. The signs and symptoms of potential                            delayed complications were discussed with the                            patient. Return to normal activities tomorrow.                            Written discharge instructions were provided to the                            patient.                           - Resume previous diet today.                           - Continue present medications.                           - Await pathology results.                           -  Repeat upper endoscopy in 3 months. Procedure Code(s):        --- Professional ---                           [phone], Esophagogastroduodenoscopy, flexible,                            transoral; with biopsy, single or multiple Diagnosis Code(s):        --- Professional ---                           K31.819, Angiodysplasia of stomach and duodenum                            without bleeding                           K31.7, Polyp of stomach and duodenum                           K31.89, Other diseases of stomach and duodenum                           D50.0, Iron deficiency anemia secondary to blood                            loss (chronic) CPT copyright 2019 [employer]. All rights reserved. The codes documented in this report are preliminary and upon coder review may  be revised to meet current compliance requirements. Hildred Laser, MD Hildred Laser, MD 12/20/2021 10:04:20 AM This report has been signed electronically. Number of Addenda: 0

## 2021-12-20 NOTE — Op Note (Signed)
[facility] Patient Name: Cassidy Barber Procedure Date: 12/20/2021 9:43 AM MRN: [id number] Date of Birth: 1951/03/25 Attending MD: Hildred Laser , MD CSN: [id number] Age: 71 Admit Type: Outpatient Procedure:                Flexible Sigmoidoscopy Indications:              Gastrointestinal occult blood loss Providers:                Hildred Laser, MD, Rosina Lowenstein, RN, Hughie Closs                            RN, RN, Randa Spike, Technician Referring MD:             [name], FNP Medicines:                Propofol per Anesthesia Complications:            No immediate complications. Estimated Blood Loss:     Estimated blood loss was minimal. Procedure:                Pre-Anesthesia Assessment:                           - Prior to the procedure, a History and Physical                            was performed, and patient medications and                            allergies were reviewed. The patient's tolerance of                            previous anesthesia was also reviewed. The risks                            and benefits of the procedure and the sedation                            options and risks were discussed with the patient.                            All questions were answered, and informed consent                            was obtained. Prior Anticoagulants: The patient has                            taken no previous anticoagulant or antiplatelet                            agents except for aspirin. ASA Grade Assessment:                            III - A patient with severe systemic disease. After  reviewing the risks and benefits, the patient was                            deemed in satisfactory condition to undergo the                            procedure.                           After obtaining informed consent, the scope was                            passed under direct vision. The PCF-HQ190L                             ([id number]) scope was introduced through the anus and                            advanced to the the ileo-sigmoid anastomosis. The                            flexible sigmoidoscopy was accomplished without                            difficulty. The patient tolerated the procedure                            well. The quality of the bowel preparation was                            excellent. Scope In: 9:44:54 AM Scope Out: 9:52:47 AM Total Procedure Duration: 0 hours 7 minutes 53 seconds  Findings:      The perianal and digital rectal examinations were normal.      The ileum appeared normal.      There was evidence of a prior end-to-end ileo-colonic anastomosis at 20       cm proximal to the anus. This was patent and was characterized by       healthy appearing mucosa. The anastomosis was traversed.      The proximal rectum, mid rectum and distal sigmoid colon appeared normal.      Nonbleeding ulcerated mucosa with no stigmata of recent bleeding were       present in the distal rectum. Biopsies were taken with a cold forceps       for histology. The pathology specimen was placed into Bottle Number 2.      External hemorrhoids were found during retroflexion. The hemorrhoids       were small.      Anal papilla(e) were hypertrophied. Impression:               - The terminal ileum is normal.                           - Patent end-to-end ileo-colonic anastomosis,                            characterized by healthy  appearing mucosa.                           - The proximal rectum, mid rectum and distal                            sigmoid colon are normal.                           - Mucosal ulceration. Biopsied.                           - External hemorrhoids.                           - Anal papilla(e) were hypertrophied. Moderate Sedation:      Per Anesthesia Care Recommendation:           - Discharge patient to home (with spouse).                           - Continue present medications.                            - Await pathology results.                           - Repeat flexible sigmoidoscopy in 1 year for                            surveillance. Procedure Code(s):        --- Professional ---                           [id number], Sigmoidoscopy, flexible; with biopsy, single                            or multiple Diagnosis Code(s):        --- Professional ---                           K64.4, Residual hemorrhoidal skin tags                           Z98.0, Intestinal bypass and anastomosis status                           K63.3, Ulcer of intestine                           K62.89, Other specified diseases of anus and rectum                           R19.5, Other fecal abnormalities CPT copyright 2019 American Medical Association. All rights reserved. The codes documented in this report are preliminary and upon coder review may  be revised to meet current compliance requirements. Hildred Laser, MD Hildred Laser, MD 12/20/2021 10:10:02 AM This report has been signed electronically. Number of Addenda: 0

## 2021-12-20 NOTE — Anesthesia Preprocedure Evaluation (Addendum)
Anesthesia Evaluation  Patient identified by MRN, date of birth, ID band Patient awake    Reviewed: Allergy & Precautions, H&P , NPO status , Patient's Chart, lab work & pertinent test results, reviewed documented beta blocker date and time   Airway Mallampati: II  TM Distance: >3 FB Neck ROM: full    Dental no notable dental hx.    Pulmonary neg pulmonary ROS,    Pulmonary exam normal breath sounds clear to auscultation       Cardiovascular Exercise Tolerance: Good hypertension, negative cardio ROS   Rhythm:regular Rate:Normal     Neuro/Psych Seizures -, Well Controlled,  PSYCHIATRIC DISORDERS Anxiety    GI/Hepatic Neg liver ROS, GERD  Medicated,  Endo/Other  negative endocrine ROS  Renal/GU negative Renal ROS  negative genitourinary   Musculoskeletal   Abdominal   Peds  Hematology  (+) Blood dyscrasia, anemia ,   Anesthesia Other Findings S/p Liver TX   Reproductive/Obstetrics negative OB ROS                             Anesthesia Physical Anesthesia Plan  ASA: 3  Anesthesia Plan: General   Post-op Pain Management:    Induction:   PONV Risk Score and Plan: Propofol infusion  Airway Management Planned:   Additional Equipment:   Intra-op Plan:   Post-operative Plan:   Informed Consent: I have reviewed the patients History and Physical, chart, labs and discussed the procedure including the risks, benefits and alternatives for the proposed anesthesia with the patient or authorized representative who has indicated his/her understanding and acceptance.     Dental Advisory Given  Plan Discussed with: CRNA  Anesthesia Plan Comments:         Anesthesia Quick Evaluation

## 2021-12-21 LAB — SURGICAL PATHOLOGY

## 2021-12-21 NOTE — Progress Notes (Deleted)
NO SHOW - Called x 3 and left voice messages

## 2021-12-22 ENCOUNTER — Encounter (HOSPITAL_COMMUNITY): Payer: Self-pay | Admitting: Internal Medicine

## 2021-12-22 ENCOUNTER — Other Ambulatory Visit: Payer: Self-pay

## 2021-12-22 ENCOUNTER — Encounter (HOSPITAL_COMMUNITY): Payer: Self-pay

## 2021-12-22 ENCOUNTER — Inpatient Hospital Stay (HOSPITAL_COMMUNITY): Payer: Medicare Other | Admitting: Physician Assistant

## 2021-12-27 ENCOUNTER — Other Ambulatory Visit: Payer: Self-pay

## 2021-12-27 ENCOUNTER — Ambulatory Visit (INDEPENDENT_AMBULATORY_CARE_PROVIDER_SITE_OTHER): Payer: Medicare Other | Admitting: Physician Assistant

## 2021-12-27 ENCOUNTER — Other Ambulatory Visit (INDEPENDENT_AMBULATORY_CARE_PROVIDER_SITE_OTHER): Payer: Self-pay

## 2021-12-27 ENCOUNTER — Encounter (INDEPENDENT_AMBULATORY_CARE_PROVIDER_SITE_OTHER): Payer: Self-pay

## 2021-12-27 DIAGNOSIS — L570 Actinic keratosis: Secondary | ICD-10-CM

## 2021-12-27 DIAGNOSIS — C44329 Squamous cell carcinoma of skin of other parts of face: Secondary | ICD-10-CM | POA: Diagnosis not present

## 2021-12-27 DIAGNOSIS — C44729 Squamous cell carcinoma of skin of left lower limb, including hip: Secondary | ICD-10-CM | POA: Diagnosis not present

## 2021-12-27 DIAGNOSIS — C44622 Squamous cell carcinoma of skin of right upper limb, including shoulder: Secondary | ICD-10-CM | POA: Diagnosis not present

## 2021-12-27 DIAGNOSIS — K31819 Angiodysplasia of stomach and duodenum without bleeding: Secondary | ICD-10-CM

## 2021-12-27 DIAGNOSIS — D489 Neoplasm of uncertain behavior, unspecified: Secondary | ICD-10-CM

## 2021-12-27 DIAGNOSIS — C44722 Squamous cell carcinoma of skin of right lower limb, including hip: Secondary | ICD-10-CM

## 2021-12-27 DIAGNOSIS — C44721 Squamous cell carcinoma of skin of unspecified lower limb, including hip: Secondary | ICD-10-CM | POA: Diagnosis not present

## 2021-12-27 DIAGNOSIS — C44320 Squamous cell carcinoma of skin of unspecified parts of face: Secondary | ICD-10-CM

## 2021-12-27 DIAGNOSIS — D485 Neoplasm of uncertain behavior of skin: Secondary | ICD-10-CM

## 2021-12-27 DIAGNOSIS — C44629 Squamous cell carcinoma of skin of left upper limb, including shoulder: Secondary | ICD-10-CM

## 2021-12-27 NOTE — Patient Instructions (Signed)

## 2021-12-28 ENCOUNTER — Telehealth (INDEPENDENT_AMBULATORY_CARE_PROVIDER_SITE_OTHER): Payer: Self-pay | Admitting: *Deleted

## 2021-12-28 ENCOUNTER — Other Ambulatory Visit (INDEPENDENT_AMBULATORY_CARE_PROVIDER_SITE_OTHER): Payer: Self-pay | Admitting: *Deleted

## 2021-12-28 ENCOUNTER — Encounter (INDEPENDENT_AMBULATORY_CARE_PROVIDER_SITE_OTHER): Payer: Self-pay

## 2021-12-28 DIAGNOSIS — Z944 Liver transplant status: Secondary | ICD-10-CM

## 2021-12-28 DIAGNOSIS — Z85038 Personal history of other malignant neoplasm of large intestine: Secondary | ICD-10-CM

## 2021-12-28 DIAGNOSIS — N179 Acute kidney failure, unspecified: Secondary | ICD-10-CM

## 2021-12-28 DIAGNOSIS — D509 Iron deficiency anemia, unspecified: Secondary | ICD-10-CM

## 2021-12-28 NOTE — Progress Notes (Signed)
Virtual Visit via Telephone Note [facility]  I connected with Cassidy Barber  on 12/29/21 at  11:46 AM by telephone and verified that I am speaking with the correct person using two identifiers.  Location: Patient: Home Provider: [facility]   I discussed the limitations, risks, security and privacy concerns of performing an evaluation and management service by telephone and the availability of in person appointments. I also discussed with the patient that there may be a patient responsible charge related to this service. The patient expressed understanding and agreed to proceed.   HISTORY OF PRESENT ILLNESS: Cassidy Barber follows at our clinic for her iron deficiency anemia.  She was last evaluated via telemedicine visit by Tarri Abernethy PA-C on 09/20/2021.  She received IV Venofer 300 mg x 4 from 09/27/2021 through 10/23/2021.     At today's visit, she reports feeling fairly well apart from ongoing fatigue.  She denies any recent hospitalizations, surgeries, or changes in her baseline health status.  She has ongoing fatigue that is not significantly improved by IV iron treatments.  She has ongoing loose, dark bowel movements, which she states have been chronic ever since her colectomy.  No gross hematochezia, epistaxis, or other sources of blood loss. No pica, restless legs, chest pain, dyspnea on exertion, lightheadedness, or syncope.    She reports  65% energy and  100% appetite.  She is maintaining stable weight at this time.    OBSERVATIONS/OBJECTIVE: Review of Systems  Constitutional:  Positive for malaise/fatigue. Negative for chills, diaphoresis, fever and weight loss.  Respiratory:  Negative for cough and shortness of breath.   Cardiovascular:  Negative for chest pain and palpitations.  Gastrointestinal:  Negative for abdominal pain, blood in stool, melena, nausea and vomiting.  Neurological:  Negative for dizziness and headaches.     PHYSICAL EXAM (per limitations of virtual telephone visit): The patient is alert and oriented x 3, exhibiting adequate mentation, good mood, and ability to speak in full sentences and execute sound judgement.   ASSESSMENT & PLAN: 1.  Iron deficiency anemia from occult GI blood loss: - Unable to tolerate oral iron. - Secondary to history of colon cancer s/p abdominal colectomy.  Malabsorption. - Most recent EGD (12/20/2021): Gastric antral vascular ectasia without bleeding, treated with argon plasma coagulation with ablation of at least 80% of the lesions - Most recent sigmoidoscopy (12/20/2021): Mucosal ulceration in distal rectum, end-to-end ileocolonic anastomosis characterized by healthy-appearing mucosa - SPEP negative in 2019 - She last received IV iron (Venofer 300 mg x 4) from 09/27/2021 through 10/23/2021 - Currently symptomatic with fatigue, which is chronic  - Denies any current signs or symptoms of bleeding such as epistaxis, rectal hemorrhage, or melena.   - Most recent labs (12/14/2021): Hgb 11.9/MCV 91.4, ferritin 88, iron saturation 10% - PLAN: Recommend IV iron with Venofer 300 mg x 3 doses - Repeat labs and RTC in 3 months for follow-up  - PHONE VISIT - Continue follow-up with GI (Dr. Laural Golden) for management of any GI blood loss   2.  Liver Transplant: -Had this completed at [facility] in January 2015.  -She is on immunosuppression with everolimus.   3.  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 -She is followed by dermatology and has had several areas treated recently with topical creams.   4.  History of colon cancer: -  Status post abdominal colectomy on 09/17/2011 with simultaneous total abdominal hysterectomy and bilateral salpingo-oophorectomy. -Found to have Lynch syndrome. -Per recommendations she is to have an EGD every 2 to 3 years and annual  colonoscopy for review of her residual colon tissue at the anastomosis. - Sigmoidoscopy (02/15/2021): Two nonbleeding erosions at ileocolonic anastomosis, small external hemorrhoids   FOLLOW UP INSTRUCTIONS: IV Venofer 300 mg x 3 doses Labs and PHONE visit in 3 months    I discussed the assessment and treatment plan with the patient. The patient was provided an opportunity to ask questions and all were answered. The patient agreed with the plan and demonstrated an understanding of the instructions.   The patient was advised to call back or seek an in-person evaluation if the symptoms worsen or if the condition fails to improve as anticipated.  I provided 14 minutes of non-face-to-face time during this encounter.   Harriett Rush, PA-C 12/29/21 1:13 PM

## 2021-12-28 NOTE — Telephone Encounter (Signed)
Ok per dr Laural Golden. Orders put in and pt notified.

## 2021-12-28 NOTE — Telephone Encounter (Signed)
Pt asked if Dr. Laural Golden would order labs that [facility] wants her to have before her appt next week. She states [facility] has trouble getting her orders to [facility] in [place].   CMP CBC Everolimus Magnessium  Pt would like at [facility] [place].   [phone]

## 2021-12-29 ENCOUNTER — Other Ambulatory Visit: Payer: Self-pay

## 2021-12-29 ENCOUNTER — Inpatient Hospital Stay (HOSPITAL_BASED_OUTPATIENT_CLINIC_OR_DEPARTMENT_OTHER): Payer: Medicare Other | Admitting: Physician Assistant

## 2021-12-29 DIAGNOSIS — D509 Iron deficiency anemia, unspecified: Secondary | ICD-10-CM

## 2021-12-29 DIAGNOSIS — Z944 Liver transplant status: Secondary | ICD-10-CM | POA: Diagnosis not present

## 2021-12-29 DIAGNOSIS — Z85038 Personal history of other malignant neoplasm of large intestine: Secondary | ICD-10-CM | POA: Diagnosis not present

## 2021-12-29 DIAGNOSIS — N179 Acute kidney failure, unspecified: Secondary | ICD-10-CM | POA: Diagnosis not present

## 2022-01-01 ENCOUNTER — Other Ambulatory Visit (INDEPENDENT_AMBULATORY_CARE_PROVIDER_SITE_OTHER): Payer: Self-pay | Admitting: *Deleted

## 2022-01-01 DIAGNOSIS — E875 Hyperkalemia: Secondary | ICD-10-CM

## 2022-01-01 LAB — COMPREHENSIVE METABOLIC PANEL
ALT: 14 IU/L (ref 0–32)
AST: 26 IU/L (ref 0–40)
Albumin/Globulin Ratio: 1.4 (ref 1.2–2.2)
Albumin: 4 g/dL (ref 3.8–4.8)
Alkaline Phosphatase: 173 IU/L — ABNORMAL HIGH (ref 44–121)
BUN/Creatinine Ratio: 16 (ref 12–28)
BUN: 13 mg/dL (ref 8–27)
Bilirubin Total: <0.2 mg/dL (ref 0.0–1.2)
CO2: 26 mmol/L (ref 20–29)
Calcium: 9.2 mg/dL (ref 8.7–10.3)
Chloride: 102 mmol/L (ref 96–106)
Creatinine, Ser: 0.83 mg/dL (ref 0.57–1.00)
Globulin, Total: 2.9 g/dL (ref 1.5–4.5)
Glucose: 95 mg/dL (ref 70–99)
Potassium: 5.3 mmol/L — ABNORMAL HIGH (ref 3.5–5.2)
Sodium: 139 mmol/L (ref 134–144)
Total Protein: 6.9 g/dL (ref 6.0–8.5)
eGFR: 76 mL/min/{1.73_m2} (ref >59)

## 2022-01-01 LAB — CBC WITH DIFFERENTIAL/PLATELET
Basophils Absolute: 0.1 10*3/uL (ref 0.0–0.2)
Basos: 3 %
EOS (ABSOLUTE): 0.4 10*3/uL (ref 0.0–0.4)
Eos: 7 %
Hematocrit: 37.4 % (ref 34.0–46.6)
Hemoglobin: 11.7 g/dL (ref 11.1–15.9)
Immature Grans (Abs): 0 10*3/uL (ref 0.0–0.1)
Immature Granulocytes: 0 %
Lymphocytes Absolute: 1.7 10*3/uL (ref 0.7–3.1)
Lymphs: 33 %
MCH: 27.1 pg (ref 26.6–33.0)
MCHC: 31.3 g/dL — ABNORMAL LOW (ref 31.5–35.7)
MCV: 87 fL (ref 79–97)
Monocytes Absolute: 0.9 10*3/uL (ref 0.1–0.9)
Monocytes: 17 %
Neutrophils Absolute: 2.1 10*3/uL (ref 1.4–7.0)
Neutrophils: 40 %
Platelets: 358 10*3/uL (ref 150–450)
RBC: 4.32 x10E6/uL (ref 3.77–5.28)
RDW: 14.8 % (ref 11.7–15.4)
WBC: 5.3 10*3/uL (ref 3.4–10.8)

## 2022-01-01 LAB — MAGNESIUM: Magnesium: 1.9 mg/dL (ref 1.6–2.3)

## 2022-01-01 LAB — EVEROLIMUS: Everolimus: 5.9 ng/mL (ref 3.0–8.0)

## 2022-01-02 ENCOUNTER — Other Ambulatory Visit: Payer: Self-pay

## 2022-01-02 ENCOUNTER — Inpatient Hospital Stay (HOSPITAL_COMMUNITY): Payer: Medicare Other

## 2022-01-02 ENCOUNTER — Encounter (HOSPITAL_COMMUNITY): Payer: Self-pay

## 2022-01-02 VITALS — BP 138/67 | HR 65 | Temp 97.8°F | Resp 18 | Ht 63.0 in | Wt 146.6 lb

## 2022-01-02 DIAGNOSIS — K922 Gastrointestinal hemorrhage, unspecified: Secondary | ICD-10-CM | POA: Diagnosis not present

## 2022-01-02 DIAGNOSIS — D508 Other iron deficiency anemias: Secondary | ICD-10-CM

## 2022-01-02 DIAGNOSIS — D500 Iron deficiency anemia secondary to blood loss (chronic): Secondary | ICD-10-CM | POA: Diagnosis not present

## 2022-01-02 DIAGNOSIS — Z85038 Personal history of other malignant neoplasm of large intestine: Secondary | ICD-10-CM | POA: Diagnosis not present

## 2022-01-02 DIAGNOSIS — Z944 Liver transplant status: Secondary | ICD-10-CM | POA: Diagnosis not present

## 2022-01-02 MED ORDER — SODIUM CHLORIDE 0.9 % IV SOLN
300.0000 mg | Freq: Once | INTRAVENOUS | Status: AC
  Administered 2022-01-02: 300 mg via INTRAVENOUS
  Filled 2022-01-02: qty 300

## 2022-01-02 MED ORDER — SODIUM CHLORIDE 0.9 % IV SOLN: Freq: Once | INTRAVENOUS | Status: AC

## 2022-01-02 NOTE — Patient Instructions (Signed)
[facility]  Discharge Instructions: Thank you for choosing [facility] to provide your oncology and hematology care.  If you have a lab appointment with the [facility], please come in thru the Main Entrance and check in at the main information desk.  Wear comfortable clothing and clothing appropriate for easy access to any Portacath or PICC line.   We strive to give you quality time with your provider. You may need to reschedule your appointment if you arrive late (15 or more minutes).  Arriving late affects you and other patients whose appointments are after yours.  Also, if you miss three or more appointments without notifying the office, you may be dismissed from the clinic at the providers discretion.      For prescription refill requests, have your pharmacy contact our office and allow 72 hours for refills to be completed.    Today you received the following venofer, return as scheduled.   To help prevent nausea and vomiting after your treatment, we encourage you to take your nausea medication as directed.  BELOW ARE SYMPTOMS THAT SHOULD BE REPORTED IMMEDIATELY: *FEVER GREATER THAN 100.4 F (38 C) OR HIGHER *CHILLS OR SWEATING *NAUSEA AND VOMITING THAT IS NOT CONTROLLED WITH YOUR NAUSEA MEDICATION *UNUSUAL SHORTNESS OF BREATH *UNUSUAL BRUISING OR BLEEDING *URINARY PROBLEMS (pain or burning when urinating, or frequent urination) *BOWEL PROBLEMS (unusual diarrhea, constipation, pain near the anus) TENDERNESS IN MOUTH AND THROAT WITH OR WITHOUT PRESENCE OF ULCERS (sore throat, sores in mouth, or a toothache) UNUSUAL RASH, SWELLING OR PAIN  UNUSUAL VAGINAL DISCHARGE OR ITCHING   Items with * indicate a potential emergency and should be followed up as soon as possible or go to the Emergency Department if any problems should occur.  Please show the CHEMOTHERAPY ALERT CARD or IMMUNOTHERAPY ALERT CARD at check-in to the Emergency Department and triage  nurse.  Should you have questions after your visit or need to cancel or reschedule your appointment, please contact [facility] [phone]  and follow the prompts.  Office hours are 8:00 a.m. to 4:30 p.m. Monday - Friday. Please note that voicemails left after 4:00 p.m. may not be returned until the following business day.  We are closed weekends and major holidays. You have access to a nurse at all times for urgent questions. Please call the main number to the clinic [phone] and follow the prompts.  For any non-urgent questions, you may also contact your provider using MyChart. We now offer e-Visits for anyone 43 and older to request care online for non-urgent symptoms. For details visit [email or web address].   Also download the MyChart app! Go to the app store, search "MyChart", open the app, select [facility], and log in with your MyChart username and password.  Due to Covid, a mask is required upon entering the hospital/clinic. If you do not have a mask, one will be given to you upon arrival. For doctor visits, patients may have 1 support person aged 13 or older with them. For treatment visits, patients cannot have anyone with them due to current Covid guidelines and our immunocompromised population.

## 2022-01-02 NOTE — Progress Notes (Signed)
Patient presents today for venofer infusion patient reports taking pre-medications at 8:00 this morning before coming to clinic. Patient tolerated iron infusion with no complaints voiced. Patient refuses to wait suggested 30 min wait time post iron infusion. Peripheral IV site clean and dry with good blood return noted before and after infusion. Band aid applied. VSS with discharge and left in satisfactory condition with no s/s of distress noted.

## 2022-01-03 ENCOUNTER — Telehealth: Payer: Self-pay | Admitting: *Deleted

## 2022-01-03 NOTE — Telephone Encounter (Signed)
Left patient a message with results. She has appointment already scheduled to treat chest lesion.

## 2022-01-03 NOTE — Telephone Encounter (Signed)
-----   Message from Lavonna Monarch, MD sent at 01/03/2022 10:19 AM EST ----- End of the day for 4, and 5

## 2022-01-04 DIAGNOSIS — D849 Immunodeficiency, unspecified: Secondary | ICD-10-CM | POA: Diagnosis not present

## 2022-01-04 DIAGNOSIS — K31819 Angiodysplasia of stomach and duodenum without bleeding: Secondary | ICD-10-CM | POA: Diagnosis not present

## 2022-01-04 DIAGNOSIS — Z944 Liver transplant status: Secondary | ICD-10-CM | POA: Diagnosis not present

## 2022-01-04 DIAGNOSIS — Z85828 Personal history of other malignant neoplasm of skin: Secondary | ICD-10-CM | POA: Diagnosis not present

## 2022-01-04 DIAGNOSIS — Z1509 Genetic susceptibility to other malignant neoplasm: Secondary | ICD-10-CM | POA: Diagnosis not present

## 2022-01-08 ENCOUNTER — Telehealth: Payer: Self-pay | Admitting: Physician Assistant

## 2022-01-08 DIAGNOSIS — Z79899 Other long term (current) drug therapy: Secondary | ICD-10-CM

## 2022-01-08 NOTE — Telephone Encounter (Signed)
Patient left message on office voice mail that she wanted to speak with a nurse about the prescription for Soriatane.  Patient stated that her doctor at [facility] has given approval for her to use Soriatane.

## 2022-01-08 NOTE — Telephone Encounter (Signed)
Cassidy Barber reviewed [facility] office visit in care everywhere- verbal given by [facility] Barber- ok to start soriatane 25mg  # 30 after we get lipid results. Order for lipid panel sent to [facility]- patient states she will go by lab tomorrow to have that drawn and will call us back for results.

## 2022-01-09 ENCOUNTER — Ambulatory Visit (HOSPITAL_COMMUNITY): Payer: Medicare Other

## 2022-01-09 ENCOUNTER — Inpatient Hospital Stay (HOSPITAL_COMMUNITY): Payer: Medicare Other

## 2022-01-09 VITALS — BP 143/62 | HR 66 | Temp 97.9°F | Resp 18

## 2022-01-09 DIAGNOSIS — D508 Other iron deficiency anemias: Secondary | ICD-10-CM

## 2022-01-09 DIAGNOSIS — K922 Gastrointestinal hemorrhage, unspecified: Secondary | ICD-10-CM | POA: Diagnosis not present

## 2022-01-09 DIAGNOSIS — Z85038 Personal history of other malignant neoplasm of large intestine: Secondary | ICD-10-CM | POA: Diagnosis not present

## 2022-01-09 DIAGNOSIS — Z79899 Other long term (current) drug therapy: Secondary | ICD-10-CM | POA: Diagnosis not present

## 2022-01-09 DIAGNOSIS — Z944 Liver transplant status: Secondary | ICD-10-CM | POA: Diagnosis not present

## 2022-01-09 DIAGNOSIS — D500 Iron deficiency anemia secondary to blood loss (chronic): Secondary | ICD-10-CM | POA: Diagnosis not present

## 2022-01-09 LAB — LIPID PANEL
Cholesterol: 174 mg/dL (ref <200)
HDL: 68 mg/dL (ref >=50)
LDL Cholesterol (Calc): 82 mg/dL (calc)
Non-HDL Cholesterol (Calc): 106 mg/dL (calc) (ref <130)
Total CHOL/HDL Ratio: 2.6 (calc) (ref <5.0)
Triglycerides: 138 mg/dL (ref <150)

## 2022-01-09 MED ORDER — ACETAMINOPHEN 325 MG PO TABS
650.0000 mg | ORAL_TABLET | Freq: Once | ORAL | Status: AC
  Administered 2022-01-09: 650 mg via ORAL
  Filled 2022-01-09: qty 2

## 2022-01-09 MED ORDER — SODIUM CHLORIDE 0.9 % IV SOLN: Freq: Once | INTRAVENOUS | Status: AC

## 2022-01-09 MED ORDER — SODIUM CHLORIDE 0.9 % IV SOLN
300.0000 mg | Freq: Once | INTRAVENOUS | Status: AC
  Administered 2022-01-09: 300 mg via INTRAVENOUS
  Filled 2022-01-09: qty 300

## 2022-01-09 MED ORDER — LORATADINE 10 MG PO TABS
10.0000 mg | ORAL_TABLET | Freq: Once | ORAL | Status: AC
  Administered 2022-01-09: 10 mg via ORAL
  Filled 2022-01-09: qty 1

## 2022-01-09 NOTE — Progress Notes (Signed)
Patient presents today for iron infusion.  Patient is in satisfactory condition with no new complaints voiced.  Vital signs are stable.  We will proceed with treatment per MD orders.   Patient tolerated treatment well with no complaints voiced.  Patient refused to wait the suggested 30 minute post iron wait time.  Patient left ambulatory in stable condition.  Vital signs stable at discharge.  Follow up as scheduled.

## 2022-01-09 NOTE — Progress Notes (Signed)
Chaplain engaged in an initial visit with Saba who recounted her healthcare journey.  She shared about how much her life has changed within the last 8 years from having a liver transplant, most of her colon removed, becoming severely anemic, and obtaining a recent diagnosis.  [name] talked about the ways she has been able to retain some sense of normalcy and happiness around all of the changes happening to her body.  She takes on the motto of the energizer bunny to "take a lickin' and keep on tickin.'"  She noted that she used to be very emotional and would feel herself worn down by very simple things but now she is able to internalize the things happening in her life much differently.  She is able to keep going and pressing on.    [name] also talked about her support system which includes her husband, son, and grandson.  Her son is the one who gave her a liver and Cortasia recognizes that as a miracle.  She also voiced that her husband is very attentive to her needs.  Chaplain provided space for [name] to share her story and the ways her life has changed drastically.  Chaplain offered listening as she talked about her life's philosophy and family.      01/09/22 1100  Clinical Encounter Type  Visited With Patient  Visit Type Initial;Spiritual support

## 2022-01-09 NOTE — Patient Instructions (Signed)
[facility]  Discharge Instructions: Thank you for choosing [facility] to provide your oncology and hematology care.  If you have a lab appointment with the Cancer Center, please come in thru the Main Entrance and check in at the main information desk.  Wear comfortable clothing and clothing appropriate for easy access to any Portacath or PICC line.   We strive to give you quality time with your provider. You may need to reschedule your appointment if you arrive late (15 or more minutes).  Arriving late affects you and other patients whose appointments are after yours.  Also, if you miss three or more appointments without notifying the office, you may be dismissed from the clinic at the provider's discretion.      For prescription refill requests, have your pharmacy contact our office and allow 72 hours for refills to be completed.        To help prevent nausea and vomiting after your treatment, we encourage you to take your nausea medication as directed.  BELOW ARE SYMPTOMS THAT SHOULD BE REPORTED IMMEDIATELY: *FEVER GREATER THAN 100.4 F (38 C) OR HIGHER *CHILLS OR SWEATING *NAUSEA AND VOMITING THAT IS NOT CONTROLLED WITH YOUR NAUSEA MEDICATION *UNUSUAL SHORTNESS OF BREATH *UNUSUAL BRUISING OR BLEEDING *URINARY PROBLEMS (pain or burning when urinating, or frequent urination) *BOWEL PROBLEMS (unusual diarrhea, constipation, pain near the anus) TENDERNESS IN MOUTH AND THROAT WITH OR WITHOUT PRESENCE OF ULCERS (sore throat, sores in mouth, or a toothache) UNUSUAL RASH, SWELLING OR PAIN  UNUSUAL VAGINAL DISCHARGE OR ITCHING   Items with * indicate a potential emergency and should be followed up as soon as possible or go to the Emergency Department if any problems should occur.  Please show the CHEMOTHERAPY ALERT CARD or IMMUNOTHERAPY ALERT CARD at check-in to the Emergency Department and triage nurse.  Should you have questions after your visit or need to cancel  or reschedule your appointment, please contact [facility] [phone]  and follow the prompts.  Office hours are 8:00 a.m. to 4:30 p.m. Monday - Friday. Please note that voicemails left after 4:00 p.m. may not be returned until the following business day.  We are closed weekends and major holidays. You have access to a nurse at all times for urgent questions. Please call the main number to the clinic [phone] and follow the prompts.  For any non-urgent questions, you may also contact your provider using MyChart. We now offer e-Visits for anyone 18 and older to request care online for non-urgent symptoms. For details visit [email or web address].   Also download the MyChart app! Go to the app store, search "MyChart", open the app, select [facility], and log in with your MyChart username and password.  Due to Covid, a mask is required upon entering the hospital/clinic. If you do not have a mask, one will be given to you upon arrival. For doctor visits, patients may have 1 support person aged 18 or older with them. For treatment visits, patients cannot have anyone with them due to current Covid guidelines and our immunocompromised population.  

## 2022-01-10 NOTE — Telephone Encounter (Signed)
Error

## 2022-01-11 ENCOUNTER — Ambulatory Visit (HOSPITAL_COMMUNITY): Payer: Medicare Other

## 2022-01-11 ENCOUNTER — Encounter: Payer: Medicare Other | Admitting: Physician Assistant

## 2022-01-11 ENCOUNTER — Telehealth: Payer: Self-pay

## 2022-01-11 DIAGNOSIS — C44329 Squamous cell carcinoma of skin of other parts of face: Secondary | ICD-10-CM | POA: Diagnosis not present

## 2022-01-11 MED ORDER — ACITRETIN 25 MG PO CAPS: 25.0000 mg | ORAL_CAPSULE | Freq: Every day | ORAL | 1 refills | Status: DC

## 2022-01-11 NOTE — Telephone Encounter (Signed)
-----   Message from [name], [place] sent at 01/10/2022  7:53 AM EST ----- ?Please call in Acetretin 25mg  1 po qd #[id number] ?

## 2022-01-11 NOTE — Telephone Encounter (Signed)
Phone call to patient with [facility] recommendations. Patient aware.  ?

## 2022-01-12 ENCOUNTER — Encounter: Payer: Self-pay | Admitting: Family

## 2022-01-12 ENCOUNTER — Ambulatory Visit (INDEPENDENT_AMBULATORY_CARE_PROVIDER_SITE_OTHER): Payer: Medicare Other | Admitting: Family

## 2022-01-12 VITALS — BP 114/76 | HR 82 | Temp 98.1°F | Ht 63.0 in | Wt 145.0 lb

## 2022-01-12 DIAGNOSIS — K219 Gastro-esophageal reflux disease without esophagitis: Secondary | ICD-10-CM

## 2022-01-12 DIAGNOSIS — E559 Vitamin D deficiency, unspecified: Secondary | ICD-10-CM

## 2022-01-12 DIAGNOSIS — Z79899 Other long term (current) drug therapy: Secondary | ICD-10-CM

## 2022-01-12 DIAGNOSIS — F1320 Sedative, hypnotic or anxiolytic dependence, uncomplicated: Secondary | ICD-10-CM

## 2022-01-12 DIAGNOSIS — Z944 Liver transplant status: Secondary | ICD-10-CM

## 2022-01-12 DIAGNOSIS — E039 Hypothyroidism, unspecified: Secondary | ICD-10-CM | POA: Diagnosis not present

## 2022-01-12 DIAGNOSIS — L509 Urticaria, unspecified: Secondary | ICD-10-CM

## 2022-01-12 DIAGNOSIS — I10 Essential (primary) hypertension: Secondary | ICD-10-CM

## 2022-01-12 DIAGNOSIS — G4700 Insomnia, unspecified: Secondary | ICD-10-CM

## 2022-01-12 DIAGNOSIS — D849 Immunodeficiency, unspecified: Secondary | ICD-10-CM

## 2022-01-12 DIAGNOSIS — F411 Generalized anxiety disorder: Secondary | ICD-10-CM

## 2022-01-12 MED ORDER — CETIRIZINE HCL 10 MG PO TABS: 10.0000 mg | ORAL_TABLET | Freq: Every day | ORAL | 3 refills | Status: DC | PRN

## 2022-01-12 MED ORDER — ALENDRONATE SODIUM 70 MG PO TABS: ORAL_TABLET | ORAL | 2 refills | Status: DC

## 2022-01-12 MED ORDER — ALPRAZOLAM 0.5 MG PO TABS: 0.5000 mg | ORAL_TABLET | Freq: Every day | ORAL | 1 refills | Status: DC

## 2022-01-12 NOTE — Progress Notes (Signed)
? ?Subjective:  ? ? Patient ID: Cassidy Barber, female    DOB: [birth year], 71 y.o.   MRN: [id number] ? ?No chief complaint on file. ? ?PT presents to the office today for chronic follow up. She is followed by [facility] for hx of liver transplant. She had an incarcerated ventral hernia with fluid in the hernia sac 04/05/18. She is scheduled for EGG on 02/14/22. She has been diagnosed with GAVE.  ? ?She is followed by Hemologists every 3 months for iron deficiency anemia.    ? ?She had a left reverse shoulder 07/17/18 and then had to redone in 09/27/19. She is scheduled for reverse shoulder arthroplasty on 07/20/21 on her right shoulder.  ?  ?She currently has skin cancer on her scalp and is completed 30 radiation treatments then had 20 radiation treatments on another.  She is followed by dermatologists for recurrent skin cancer. ?Hypertension ?This is a chronic problem. The current episode started more than 1 year ago. The problem has been resolved since onset. The problem is controlled. Associated symptoms include anxiety, malaise/fatigue and peripheral edema. Pertinent negatives include no palpitations or shortness of breath. Risk factors for coronary artery disease include dyslipidemia and obesity. The current treatment provides moderate improvement. Identifiable causes of hypertension include a thyroid problem.  ?Gastroesophageal Reflux ?She complains of belching, heartburn and a hoarse voice. She reports no stridor. This is a chronic problem. The current episode started more than 1 year ago. The problem occurs occasionally. Associated symptoms include fatigue. She has tried a PPI for the symptoms. The treatment provided moderate relief.  ?Thyroid Problem ?Presents for follow-up visit. Symptoms include anxiety, diarrhea, fatigue and hoarse voice. Patient reports no depressed mood or palpitations. The symptoms have been stable.  ?Anxiety ?Presents for follow-up visit. Symptoms include nervous/anxious behavior. Patient  reports no depressed mood, excessive worry, irritability, palpitations or shortness of breath.  ? ? ? ? ? ?Review of Systems  ?Constitutional:  Positive for fatigue and malaise/fatigue. Negative for irritability.  ?HENT:  Positive for hoarse voice.   ?Respiratory:  Negative for shortness of breath.   ?Cardiovascular:  Negative for palpitations.  ?Gastrointestinal:  Positive for diarrhea and heartburn.  ?Psychiatric/Behavioral:  The patient is nervous/anxious.   ?All other systems reviewed and are negative. ? ?   ?Objective:  ? Physical Exam ?Vitals reviewed.  ?Constitutional:   ?   General: She is not in acute distress. ?   Appearance: She is well-developed.  ?HENT:  ?   Head: Normocephalic and atraumatic.  ?   Right Ear: Tympanic membrane normal.  ?   Left Ear: Tympanic membrane normal.  ?Eyes:  ?   Pupils: Pupils are equal, round, and reactive to light.  ?Neck:  ?   Thyroid: No thyromegaly.  ?Cardiovascular:  ?   Rate and Rhythm: Normal rate and regular rhythm.  ?   Heart sounds: Normal heart sounds. No murmur heard. ?Pulmonary:  ?   Effort: Pulmonary effort is normal. No respiratory distress.  ?   Breath sounds: Normal breath sounds. No wheezing.  ?Abdominal:  ?   General: Bowel sounds are normal. There is no distension.  ?   Palpations: Abdomen is soft.  ?   Tenderness: There is no abdominal tenderness.  ?Musculoskeletal:     ?   General: No tenderness. Normal range of motion.  ?   Cervical back: Normal range of motion and neck supple.  ?Skin: ?   General: Skin is warm and dry.  ?Neurological:  ?  Mental Status: She is alert and oriented to person, place, and time.  ?   Cranial Nerves: No cranial nerve deficit.  ?   Deep Tendon Reflexes: Reflexes are normal and symmetric.  ?Psychiatric:     ?   Behavior: Behavior normal.     ?   Thought Content: Thought content normal.     ?   Judgment: Judgment normal.  ? ? ? ? ?BP 114/76   Pulse 82   Temp 98.1 ?F (36.7 ?C) (Temporal)   Ht 5\' 3"  (1.6 m)   Wt 145 lb (65.8  kg)   BMI 25.69 kg/m?  ? ?   ?Assessment & Plan:  ?DERIANA VANDERHOEF comes in today with chief complaint of Medical Management of Chronic Issues ? ? ?Diagnosis and orders addressed: ? ?1. Primary hypertension ? ?2. Gastroesophageal reflux disease, unspecified whether esophagitis present ? ?3. Hypothyroidism, unspecified type ? ?4. Vitamin D deficiency ? ?5. Insomnia, unspecified type ? ?6. Immunosuppression ([place]) ? ?7. Hx of liver transplant ([place]) ? ?8. GAD (generalized anxiety disorder) ?- ALPRAZolam (XANAX) 0.5 MG tablet; Take 1 tablet (0.5 mg total) by mouth at bedtime.  Dispense: 90 tablet; Refill: 1 ? ?9. Controlled substance agreement signed ?- ALPRAZolam (XANAX) 0.5 MG tablet; Take 1 tablet (0.5 mg total) by mouth at bedtime.  Dispense: 90 tablet; Refill: 1 ? ?10. Benzodiazepine dependence ([place]) ?- ALPRAZolam (XANAX) 0.5 MG tablet; Take 1 tablet (0.5 mg total) by mouth at bedtime.  Dispense: 90 tablet; Refill: 1 ? ?11. Urticaria ?- cetirizine (ZYRTEC) 10 MG tablet; Take 1 tablet (10 mg total) by mouth daily as needed for allergies.  Dispense: 90 tablet; Refill: 3 ? ? ?Labs pending ?Patient reviewed in [place] controlled database, no flags noted. Contract and drug screen are up to date.  07/11/21 ?Health Maintenance reviewed ?Diet and exercise encouraged ? ?Follow up plan: ?6 months  ? ? ?[name], FNP ? ? ? ?

## 2022-01-12 NOTE — Patient Instructions (Signed)
Health Maintenance After Age 71 After age 71, you are at a higher risk for certain long-term diseases and infections as well as injuries from falls. Falls are a major cause of broken bones and head injuries in people who are older than age 71. Getting regular preventive care can help to keep you healthy and well. Preventive care includes getting regular testing and making lifestyle changes as recommended by your health care provider. Talk with your health care provider about: Which screenings and tests you should have. A screening is a test that checks for a disease when you have no symptoms. A diet and exercise plan that is right for you. What should I know about screenings and tests to prevent falls? Screening and testing are the best ways to find a health problem early. Early diagnosis and treatment give you the best chance of managing medical conditions that are common after age 71. Certain conditions and lifestyle choices may make you more likely to have a fall. Your health care provider may recommend: Regular vision checks. Poor vision and conditions such as cataracts can make you more likely to have a fall. If you wear glasses, make sure to get your prescription updated if your vision changes. Medicine review. Work with your health care provider to regularly review all of the medicines you are taking, including over-the-counter medicines. Ask your health care provider about any side effects that may make you more likely to have a fall. Tell your health care provider if any medicines that you take make you feel dizzy or sleepy. Strength and balance checks. Your health care provider may recommend certain tests to check your strength and balance while standing, walking, or changing positions. Foot health exam. Foot pain and numbness, as well as not wearing proper footwear, can make you more likely to have a fall. Screenings, including: Osteoporosis screening. Osteoporosis is a condition that causes  the bones to get weaker and break more easily. Blood pressure screening. Blood pressure changes and medicines to control blood pressure can make you feel dizzy. Depression screening. You may be more likely to have a fall if you have a fear of falling, feel depressed, or feel unable to do activities that you used to do. Alcohol use screening. Using too much alcohol can affect your balance and may make you more likely to have a fall. Follow these instructions at home: Lifestyle Do not drink alcohol if: Your health care provider tells you not to drink. If you drink alcohol: Limit how much you have to: 0-1 drink a day for women. 0-2 drinks a day for men. Know how much alcohol is in your drink. In the [place] one drink equals one 12 oz bottle of beer (355 mL), one 5 oz glass of wine (148 mL), or one 1 oz glass of hard liquor (44 mL). Do not use any products that contain nicotine or tobacco. These products include cigarettes, chewing tobacco, and vaping devices, such as e-cigarettes. If you need help quitting, ask your health care provider. Activity  Follow a regular exercise program to stay fit. This will help you maintain your balance. Ask your health care provider what types of exercise are appropriate for you. If you need a cane or walker, use it as recommended by your health care provider. Wear supportive shoes that have nonskid soles. Safety  Remove any tripping hazards, such as rugs, cords, and clutter. Install safety equipment such as grab bars in bathrooms and safety rails on stairs. Keep rooms and walkways   well-lit. General instructions Talk with your health care provider about your risks for falling. Tell your health care provider if: You fall. Be sure to tell your health care provider about all falls, even ones that seem minor. You feel dizzy, tiredness (fatigue), or off-balance. Take over-the-counter and prescription medicines only as told by your health care provider. These include  supplements. Eat a healthy diet and maintain a healthy weight. A healthy diet includes low-fat dairy products, low-fat (lean) meats, and fiber from whole grains, beans, and lots of fruits and vegetables. Stay current with your vaccines. Schedule regular health, dental, and eye exams. Summary Having a healthy lifestyle and getting preventive care can help to protect your health and wellness after age 71. Screening and testing are the best way to find a health problem early and help you avoid having a fall. Early diagnosis and treatment give you the best chance for managing medical conditions that are more common for people who are older than age 71. Falls are a major cause of broken bones and head injuries in people who are older than age 71. Take precautions to prevent a fall at home. Work with your health care provider to learn what changes you can make to improve your health and wellness and to prevent falls. This information is not intended to replace advice given to you by your health care provider. Make sure you discuss any questions you have with your health care provider. Document Revised: 03/20/2021 Document Reviewed: 03/20/2021 Elsevier Patient Education  2022 Elsevier Inc.  

## 2022-01-14 ENCOUNTER — Encounter: Payer: Self-pay | Admitting: Physician Assistant

## 2022-01-18 DIAGNOSIS — Z944 Liver transplant status: Secondary | ICD-10-CM | POA: Diagnosis not present

## 2022-01-19 ENCOUNTER — Inpatient Hospital Stay (HOSPITAL_COMMUNITY): Payer: Medicare Other | Attending: Hematology

## 2022-01-19 ENCOUNTER — Other Ambulatory Visit: Payer: Self-pay

## 2022-01-19 VITALS — BP 139/65 | HR 70 | Temp 96.9°F | Resp 18

## 2022-01-19 DIAGNOSIS — D500 Iron deficiency anemia secondary to blood loss (chronic): Secondary | ICD-10-CM | POA: Insufficient documentation

## 2022-01-19 DIAGNOSIS — E875 Hyperkalemia: Secondary | ICD-10-CM | POA: Diagnosis not present

## 2022-01-19 DIAGNOSIS — K922 Gastrointestinal hemorrhage, unspecified: Secondary | ICD-10-CM | POA: Diagnosis not present

## 2022-01-19 DIAGNOSIS — D508 Other iron deficiency anemias: Secondary | ICD-10-CM

## 2022-01-19 MED ORDER — SODIUM CHLORIDE 0.9 % IV SOLN
300.0000 mg | Freq: Once | INTRAVENOUS | Status: AC
  Administered 2022-01-19: 300 mg via INTRAVENOUS
  Filled 2022-01-19: qty 300

## 2022-01-19 MED ORDER — SODIUM CHLORIDE 0.9 % IV SOLN: Freq: Once | INTRAVENOUS | Status: AC

## 2022-01-19 MED ORDER — ACETAMINOPHEN 325 MG PO TABS: 650.0000 mg | ORAL_TABLET | Freq: Once | ORAL | Status: DC

## 2022-01-19 MED ORDER — LORATADINE 10 MG PO TABS: 10.0000 mg | ORAL_TABLET | Freq: Once | ORAL | Status: DC

## 2022-01-19 NOTE — Progress Notes (Signed)
Patient presents today for Venofer infusion per providers order.  Vital signs WNL.  Patient has no new complaints at this time.  Patient took her premedications at home. ? ?Peripheral IV started and blood return noted pre and post infusion. ? ?Venofer infusion given today per MD orders.  Tolerated infusion without adverse affects.  Vital signs stable.  No complaints at this time. Patient refused her thirty minute post infusion wait time. Discharge from clinic ambulatory in stable condition.  Alert and oriented X 3.  Follow up with [facility] as scheduled.  ?

## 2022-01-19 NOTE — Patient Instructions (Signed)
[facility]  Discharge Instructions: Thank you for choosing [facility] to provide your oncology and hematology care.  If you have a lab appointment with the Cancer Center, please come in thru the Main Entrance and check in at the main information desk.  Wear comfortable clothing and clothing appropriate for easy access to any Portacath or PICC line.   We strive to give you quality time with your provider. You may need to reschedule your appointment if you arrive late (15 or more minutes).  Arriving late affects you and other patients whose appointments are after yours.  Also, if you miss three or more appointments without notifying the office, you may be dismissed from the clinic at the provider's discretion.      For prescription refill requests, have your pharmacy contact our office and allow 72 hours for refills to be completed.    Today you received the following chemotherapy and/or immunotherapy agents Venofer      To help prevent nausea and vomiting after your treatment, we encourage you to take your nausea medication as directed.  BELOW ARE SYMPTOMS THAT SHOULD BE REPORTED IMMEDIATELY: *FEVER GREATER THAN 100.4 F (38 C) OR HIGHER *CHILLS OR SWEATING *NAUSEA AND VOMITING THAT IS NOT CONTROLLED WITH YOUR NAUSEA MEDICATION *UNUSUAL SHORTNESS OF BREATH *UNUSUAL BRUISING OR BLEEDING *URINARY PROBLEMS (pain or burning when urinating, or frequent urination) *BOWEL PROBLEMS (unusual diarrhea, constipation, pain near the anus) TENDERNESS IN MOUTH AND THROAT WITH OR WITHOUT PRESENCE OF ULCERS (sore throat, sores in mouth, or a toothache) UNUSUAL RASH, SWELLING OR PAIN  UNUSUAL VAGINAL DISCHARGE OR ITCHING   Items with * indicate a potential emergency and should be followed up as soon as possible or go to the Emergency Department if any problems should occur.  Please show the CHEMOTHERAPY ALERT CARD or IMMUNOTHERAPY ALERT CARD at check-in to the Emergency  Department and triage nurse.  Should you have questions after your visit or need to cancel or reschedule your appointment, please contact [facility] [phone]  and follow the prompts.  Office hours are 8:00 a.m. to 4:30 p.m. Monday - Friday. Please note that voicemails left after 4:00 p.m. may not be returned until the following business day.  We are closed weekends and major holidays. You have access to a nurse at all times for urgent questions. Please call the main number to the clinic [phone] and follow the prompts.  For any non-urgent questions, you may also contact your provider using MyChart. We now offer e-Visits for anyone 18 and older to request care online for non-urgent symptoms. For details visit [email or web address].   Also download the MyChart app! Go to the app store, search "MyChart", open the app, select [facility], and log in with your MyChart username and password.  Due to Covid, a mask is required upon entering the hospital/clinic. If you do not have a mask, one will be given to you upon arrival. For doctor visits, patients may have 1 support person aged 18 or older with them. For treatment visits, patients cannot have anyone with them due to current Covid guidelines and our immunocompromised population.  

## 2022-01-20 LAB — BASIC METABOLIC PANEL
BUN/Creatinine Ratio: 12 (ref 12–28)
BUN: 11 mg/dL (ref 8–27)
CO2: 24 mmol/L (ref 20–29)
Calcium: 8.9 mg/dL (ref 8.7–10.3)
Chloride: 101 mmol/L (ref 96–106)
Creatinine, Ser: 0.94 mg/dL (ref 0.57–1.00)
Glucose: 98 mg/dL (ref 70–99)
Potassium: 4.1 mmol/L (ref 3.5–5.2)
Sodium: 136 mmol/L (ref 134–144)
eGFR: 65 mL/min/{1.73_m2} (ref >59)

## 2022-01-23 ENCOUNTER — Other Ambulatory Visit (INDEPENDENT_AMBULATORY_CARE_PROVIDER_SITE_OTHER): Payer: Self-pay

## 2022-01-24 ENCOUNTER — Other Ambulatory Visit: Payer: Self-pay | Admitting: Physician Assistant

## 2022-01-24 ENCOUNTER — Other Ambulatory Visit: Payer: Self-pay

## 2022-01-24 ENCOUNTER — Ambulatory Visit (INDEPENDENT_AMBULATORY_CARE_PROVIDER_SITE_OTHER): Payer: Medicare Other | Admitting: Physician Assistant

## 2022-01-24 DIAGNOSIS — L270 Generalized skin eruption due to drugs and medicaments taken internally: Secondary | ICD-10-CM

## 2022-01-24 DIAGNOSIS — T50905A Adverse effect of unspecified drugs, medicaments and biological substances, initial encounter: Secondary | ICD-10-CM | POA: Diagnosis not present

## 2022-01-24 DIAGNOSIS — C44521 Squamous cell carcinoma of skin of breast: Secondary | ICD-10-CM | POA: Diagnosis not present

## 2022-01-24 DIAGNOSIS — C44529 Squamous cell carcinoma of skin of other part of trunk: Secondary | ICD-10-CM

## 2022-01-24 MED ORDER — CLOBETASOL PROP EMOLLIENT BASE 0.05 % EX CREA: 1.0000 "application " | TOPICAL_CREAM | Freq: Two times a day (BID) | CUTANEOUS | 1 refills | Status: AC

## 2022-01-24 NOTE — Patient Instructions (Signed)

## 2022-01-26 DIAGNOSIS — L270 Generalized skin eruption due to drugs and medicaments taken internally: Secondary | ICD-10-CM | POA: Diagnosis not present

## 2022-01-26 DIAGNOSIS — Z79899 Other long term (current) drug therapy: Secondary | ICD-10-CM | POA: Diagnosis not present

## 2022-01-27 LAB — LIPID PANEL
Cholesterol: 183 mg/dL (ref <200)
HDL: 71 mg/dL (ref >=50)
LDL Cholesterol (Calc): 84 mg/dL (calc)
Non-HDL Cholesterol (Calc): 112 mg/dL (calc) (ref <130)
Total CHOL/HDL Ratio: 2.6 (calc) (ref <5.0)
Triglycerides: 190 mg/dL — ABNORMAL HIGH (ref <150)

## 2022-01-29 ENCOUNTER — Encounter: Payer: Self-pay | Admitting: Physician Assistant

## 2022-02-06 ENCOUNTER — Encounter: Payer: Self-pay | Admitting: Physician Assistant

## 2022-02-06 ENCOUNTER — Other Ambulatory Visit: Payer: Self-pay

## 2022-02-06 ENCOUNTER — Ambulatory Visit: Payer: Medicare Other | Admitting: Physician Assistant

## 2022-02-06 DIAGNOSIS — C44329 Squamous cell carcinoma of skin of other parts of face: Secondary | ICD-10-CM

## 2022-02-06 DIAGNOSIS — L988 Other specified disorders of the skin and subcutaneous tissue: Secondary | ICD-10-CM | POA: Diagnosis not present

## 2022-02-06 DIAGNOSIS — C44622 Squamous cell carcinoma of skin of right upper limb, including shoulder: Secondary | ICD-10-CM

## 2022-02-06 DIAGNOSIS — D485 Neoplasm of uncertain behavior of skin: Secondary | ICD-10-CM

## 2022-02-06 DIAGNOSIS — C4442 Squamous cell carcinoma of skin of scalp and neck: Secondary | ICD-10-CM | POA: Diagnosis not present

## 2022-02-06 DIAGNOSIS — Z85828 Personal history of other malignant neoplasm of skin: Secondary | ICD-10-CM

## 2022-02-06 DIAGNOSIS — C44320 Squamous cell carcinoma of skin of unspecified parts of face: Secondary | ICD-10-CM

## 2022-02-06 NOTE — Patient Instructions (Signed)

## 2022-02-09 ENCOUNTER — Encounter (HOSPITAL_COMMUNITY)
Admission: RE | Admit: 2022-02-09 | Discharge: 2022-02-09 | Disposition: A | Discharge status: Home / Self Care | Payer: Medicare Other | Source: Ambulatory Visit | Attending: Internal Medicine | Admitting: Internal Medicine

## 2022-02-09 VITALS — BP 144/74 | HR 73 | Temp 96.9°F | Resp 18 | Ht 64.0 in | Wt 144.0 lb

## 2022-02-09 DIAGNOSIS — D509 Iron deficiency anemia, unspecified: Secondary | ICD-10-CM | POA: Insufficient documentation

## 2022-02-09 DIAGNOSIS — Z01812 Encounter for preprocedural laboratory examination: Secondary | ICD-10-CM | POA: Insufficient documentation

## 2022-02-09 DIAGNOSIS — Z944 Liver transplant status: Secondary | ICD-10-CM | POA: Insufficient documentation

## 2022-02-09 DIAGNOSIS — K31819 Angiodysplasia of stomach and duodenum without bleeding: Secondary | ICD-10-CM

## 2022-02-09 LAB — CBC WITH DIFFERENTIAL/PLATELET
Abs Immature Granulocytes: 0.02 10*3/uL (ref 0.00–0.07)
Basophils Absolute: 0.1 10*3/uL (ref 0.0–0.1)
Basophils Relative: 2 %
Eosinophils Absolute: 0.2 10*3/uL (ref 0.0–0.5)
Eosinophils Relative: 4 %
HCT: 40.8 % (ref 36.0–46.0)
Hemoglobin: 12.5 g/dL (ref 12.0–15.0)
Immature Granulocytes: 0 %
Lymphocytes Relative: 27 %
Lymphs Abs: 1.7 10*3/uL (ref 0.7–4.0)
MCH: 28.2 pg (ref 26.0–34.0)
MCHC: 30.6 g/dL (ref 30.0–36.0)
MCV: 91.9 fL (ref 80.0–100.0)
Monocytes Absolute: 1 10*3/uL (ref 0.1–1.0)
Monocytes Relative: 16 %
Neutro Abs: 3.1 10*3/uL (ref 1.7–7.7)
Neutrophils Relative %: 51 %
Platelets: 301 10*3/uL (ref 150–400)
RBC: 4.44 MIL/uL (ref 3.87–5.11)
RDW: 15.4 % (ref 11.5–15.5)
WBC: 6.1 10*3/uL (ref 4.0–10.5)
nRBC: 0 % (ref 0.0–0.2)

## 2022-02-09 LAB — PROTIME-INR
INR: 0.9 (ref 0.8–1.2)
Prothrombin Time: 12.3 seconds (ref 11.4–15.2)

## 2022-02-09 NOTE — Patient Instructions (Signed)
? ? ? ? ? ? Cassidy Barber ? 02/09/2022  ?  ? '@PREFPERIOPPHARMACY'$ @ ? ? Your procedure is scheduled on  02/14/2022. ? ? Report to [name] at  0600  A.M. ? ? Call this number if you have problems the morning of surgery: ? [phone] ? ? Remember: ? Follow the diet instructions given to you by the office. ?  ? Take these medicines the morning of surgery with A SIP OF WATER  ? ?pepcid, gabapentin, levothyroxine, metoprolol, flexeril(if needed), protonix, zortress. ?  ? Do not wear jewelry, make-up or nail polish. ? Do not wear lotions, powders, or perfumes, or deodorant. ? Do not shave 48 hours prior to surgery.  Men may shave face and neck. ? Do not bring valuables to the hospital. ? [facility] is not responsible for any belongings or valuables. ? ?Contacts, dentures or bridgework may not be worn into surgery.  Leave your suitcase in the car.  After surgery it may be brought to your room. ? ?For patients admitted to the hospital, discharge time will be determined by your treatment team. ? ?Patients discharged the day of surgery will not be allowed to drive home and must have someone with them for 24 hours.  ? ? ?Special instructions:   DO NOT smoke tobacco or vape for 24 hours before your procedure. ? ?Please read over the following fact sheets that you were given. ?Anesthesia Post-op Instructions and Care and Recovery After Surgery ?  ? ? ? Upper Endoscopy, Adult, Care After ?This sheet gives you information about how to care for yourself after your procedure. Your health care provider may also give you more specific instructions. If you have problems or questions, contact your health care provider. ?What can I expect after the procedure? ?After the procedure, it is common to have: ?A sore throat. ?Mild stomach pain or discomfort. ?Bloating. ?Nausea. ?Follow these instructions at home: ? ?Follow instructions from your health care provider about what to eat or drink after your procedure. ?Return to your normal  activities as told by your health care provider. Ask your health care provider what activities are safe for you. ?Take over-the-counter and prescription medicines only as told by your health care provider. ?If you were given a sedative during the procedure, it can affect you for several hours. Do not drive or operate machinery until your health care provider says that it is safe. ?Keep all follow-up visits as told by your health care provider. This is important. ?Contact a health care provider if you have: ?A sore throat that lasts longer than one day. ?Trouble swallowing. ?Get help right away if: ?You vomit blood or your vomit looks like coffee grounds. ?You have: ?A fever. ?Bloody, black, or tarry stools. ?A severe sore throat or you cannot swallow. ?Difficulty breathing. ?Severe pain in your chest or abdomen. ?Summary ?After the procedure, it is common to have a sore throat, mild stomach discomfort, bloating, and nausea. ?If you were given a sedative during the procedure, it can affect you for several hours. Do not drive or operate machinery until your health care provider says that it is safe. ?Follow instructions from your health care provider about what to eat or drink after your procedure. ?Return to your normal activities as told by your health care provider. ?This information is not intended to replace advice given to you by your health care provider. Make sure you discuss any questions you have with your health care provider. ?Document Revised: 09/04/2019 Document  Reviewed: 03/31/2018 ?Elsevier Patient Education ? [place]. ?Monitored Anesthesia Care, Care After ?This sheet gives you information about how to care for yourself after your procedure. Your health care provider may also give you more specific instructions. If you have problems or questions, contact your health care provider. ?What can I expect after the procedure? ?After the procedure, it is common to have: ?Tiredness. ?Forgetfulness  about what happened after the procedure. ?Impaired judgment for important decisions. ?Nausea or vomiting. ?Some difficulty with balance. ?Follow these instructions at home: ?For the time period you were told by your health care provider: ?  ?Rest as needed. ?Do not participate in activities where you could fall or become injured. ?Do not drive or use machinery. ?Do not drink alcohol. ?Do not take sleeping pills or medicines that cause drowsiness. ?Do not make important decisions or sign legal documents. ?Do not take care of children on your own. ?Eating and drinking ?Follow the diet that is recommended by your health care provider. ?Drink enough fluid to keep your urine pale yellow. ?If you vomit: ?Drink water, juice, or soup when you can drink without vomiting. ?Make sure you have little or no nausea before eating solid foods. ?General instructions ?Have a responsible adult stay with you for the time you are told. It is important to have someone help care for you until you are awake and alert. ?Take over-the-counter and prescription medicines only as told by your health care provider. ?If you have sleep apnea, surgery and certain medicines can increase your risk for breathing problems. Follow instructions from your health care provider about wearing your sleep device: ?Anytime you are sleeping, including during daytime naps. ?While taking prescription pain medicines, sleeping medicines, or medicines that make you drowsy. ?Avoid smoking. ?Keep all follow-up visits as told by your health care provider. This is important. ?Contact a health care provider if: ?You keep feeling nauseous or you keep vomiting. ?You feel light-headed. ?You are still sleepy or having trouble with balance after 24 hours. ?You develop a rash. ?You have a fever. ?You have redness or swelling around the IV site. ?Get help right away if: ?You have trouble breathing. ?You have new-onset confusion at home. ?Summary ?For several hours after your  procedure, you may feel tired. You may also be forgetful and have poor judgment. ?Have a responsible adult stay with you for the time you are told. It is important to have someone help care for you until you are awake and alert. ?Rest as told. Do not drive or operate machinery. Do not drink alcohol or take sleeping pills. ?Get help right away if you have trouble breathing, or if you suddenly become confused. ?This information is not intended to replace advice given to you by your health care provider. Make sure you discuss any questions you have with your health care provider. ?Document Revised: 07/14/2020 Document Reviewed: 10/01/2019 ?Elsevier Patient Education ? [place]. ? ?

## 2022-02-13 DIAGNOSIS — Z944 Liver transplant status: Secondary | ICD-10-CM | POA: Diagnosis not present

## 2022-02-14 ENCOUNTER — Ambulatory Visit (HOSPITAL_COMMUNITY)
Admission: RE | Admit: 2022-02-14 | Discharge: 2022-02-14 | Disposition: A | Discharge status: Home / Self Care | Payer: Medicare Other | Source: Ambulatory Visit | Attending: Internal Medicine | Admitting: Internal Medicine

## 2022-02-14 ENCOUNTER — Ambulatory Visit (HOSPITAL_BASED_OUTPATIENT_CLINIC_OR_DEPARTMENT_OTHER): Payer: Medicare Other | Admitting: Anesthesiology

## 2022-02-14 ENCOUNTER — Encounter (HOSPITAL_COMMUNITY): Payer: Self-pay | Admitting: Internal Medicine

## 2022-02-14 ENCOUNTER — Ambulatory Visit (HOSPITAL_COMMUNITY): Payer: Medicare Other | Admitting: Anesthesiology

## 2022-02-14 ENCOUNTER — Encounter (HOSPITAL_COMMUNITY)
Admission: RE | Disposition: A | Discharge status: Home / Self Care | Payer: Self-pay | Source: Ambulatory Visit | Attending: Internal Medicine

## 2022-02-14 DIAGNOSIS — Z85038 Personal history of other malignant neoplasm of large intestine: Secondary | ICD-10-CM | POA: Diagnosis not present

## 2022-02-14 DIAGNOSIS — Z1509 Genetic susceptibility to other malignant neoplasm: Secondary | ICD-10-CM | POA: Insufficient documentation

## 2022-02-14 DIAGNOSIS — R569 Unspecified convulsions: Secondary | ICD-10-CM | POA: Diagnosis not present

## 2022-02-14 DIAGNOSIS — I10 Essential (primary) hypertension: Secondary | ICD-10-CM | POA: Insufficient documentation

## 2022-02-14 DIAGNOSIS — Z79899 Other long term (current) drug therapy: Secondary | ICD-10-CM | POA: Diagnosis not present

## 2022-02-14 DIAGNOSIS — K269 Duodenal ulcer, unspecified as acute or chronic, without hemorrhage or perforation: Secondary | ICD-10-CM | POA: Diagnosis not present

## 2022-02-14 DIAGNOSIS — K31811 Angiodysplasia of stomach and duodenum with bleeding: Secondary | ICD-10-CM

## 2022-02-14 DIAGNOSIS — K219 Gastro-esophageal reflux disease without esophagitis: Secondary | ICD-10-CM | POA: Insufficient documentation

## 2022-02-14 DIAGNOSIS — Z9049 Acquired absence of other specified parts of digestive tract: Secondary | ICD-10-CM | POA: Insufficient documentation

## 2022-02-14 DIAGNOSIS — K31819 Angiodysplasia of stomach and duodenum without bleeding: Secondary | ICD-10-CM

## 2022-02-14 DIAGNOSIS — F419 Anxiety disorder, unspecified: Secondary | ICD-10-CM | POA: Insufficient documentation

## 2022-02-14 DIAGNOSIS — K317 Polyp of stomach and duodenum: Secondary | ICD-10-CM

## 2022-02-14 DIAGNOSIS — Z944 Liver transplant status: Secondary | ICD-10-CM | POA: Diagnosis not present

## 2022-02-14 DIAGNOSIS — D132 Benign neoplasm of duodenum: Secondary | ICD-10-CM | POA: Diagnosis not present

## 2022-02-14 DIAGNOSIS — D500 Iron deficiency anemia secondary to blood loss (chronic): Secondary | ICD-10-CM | POA: Insufficient documentation

## 2022-02-14 DIAGNOSIS — E039 Hypothyroidism, unspecified: Secondary | ICD-10-CM | POA: Insufficient documentation

## 2022-02-14 HISTORY — PX: ESOPHAGOGASTRODUODENOSCOPY (EGD) WITH PROPOFOL: SHX5813

## 2022-02-14 HISTORY — PX: POLYPECTOMY: SHX5525

## 2022-02-14 HISTORY — PX: GI RADIOFREQUENCY ABLATION: SHX6807

## 2022-02-14 SURGERY — ESOPHAGOGASTRODUODENOSCOPY (EGD) WITH PROPOFOL
Anesthesia: General

## 2022-02-14 MED ORDER — LACTATED RINGERS IV SOLN: INTRAVENOUS | Status: DC

## 2022-02-14 MED ORDER — PROPOFOL 10 MG/ML IV BOLUS
INTRAVENOUS | Status: DC | PRN
  Administered 2022-02-14: 80 mg via INTRAVENOUS

## 2022-02-14 MED ORDER — GLUCAGON HCL RDNA (DIAGNOSTIC) 1 MG IJ SOLR
INTRAMUSCULAR | Status: DC | PRN
  Administered 2022-02-14: .25 mg via INTRAVENOUS

## 2022-02-14 MED ORDER — LIDOCAINE 2% (20 MG/ML) 5 ML SYRINGE
INTRAMUSCULAR | Status: DC | PRN
  Administered 2022-02-14: 50 mg via INTRAVENOUS

## 2022-02-14 MED ORDER — PROPOFOL 500 MG/50ML IV EMUL
INTRAVENOUS | Status: DC | PRN
  Administered 2022-02-14: 200 ug/kg/min via INTRAVENOUS

## 2022-02-14 NOTE — H&P (Signed)
Cassidy Barber is an 71 y.o. female.   ?Chief Complaint: Patient is here for esophagogastroduodenoscopy with duodenal polypectomy and APC of gastric antral vascular ectasia. ?HPI: Patient is a 71 year old Caucasian female with multiple medical problems including history of LDLT(liver transplant) Lynch syndrome and history of colon cancer who has had subtotal colectomy was found to have iron deficiency anemia heme positive stool.  She has been undergoing yearly flexible sigmoidoscopy and no lesions were found.  Esophagogastroduodenoscopy in February 2023 revealed gastric antral vascular ectasia which were ablated with APC and she was also found of duodenal polyp.  Biopsy revealed adenoma.  She is returning for repeat therapeutic procedure. ?Her hemoglobin is up to 14.5 g.  She is having 3-4 bowel movements per day which is her average.  She denies nausea vomiting melena or rectal bleeding.  Heartburn is well controlled with PPI. ?She had blood work on 02/09/2022.  Her INR was was 0.9, H&H 12.5 and 40.8.  Platelet count 301K. ? ?Past Medical History:  ?Diagnosis Date  ? Abdominal wall hernia   ? Incarcerated status post surgical repair 2019 - [facility]  ? Anemia of chronic disease   ? Atypical nevus 01/21/2018  ? atypical neoplasm- Left scalp-ant (txpbx + MOHS), atypical neoplasm- Left scalp post- (txpbx + MOHS)  ? Basal cell carcinoma   ? Colon cancer ([place])   ? Colon surgery 2005 and 2012  ? History of pulmonary hypertension   ? Pre liver transplant  ? Hypertension   ? Hypothyroidism   ? Lynch syndrome   ? Osteopenia   ? Primary biliary cirrhosis ([place])   ? Status post liver transplantation - follows at [facility]  ? SCCA (squamous cell carcinoma) of skin 02/23/2020  ? Right Upper Chest(moderate) (MOH's)  ? SCCA (squamous cell carcinoma) of skin 04/07/2020  ? Left Top Leg (Keratoacanthoma) treatment after biopsy  ? SCCA (squamous cell carcinoma) of skin 04/07/2020  ? Left Foot Dorsal (in situ) treatment after biopsy  ? SCCA  (squamous cell carcinoma) of skin 03/27/2021  ? Right Upper Back (Keratoacanthoma) (excision) (clear)  ? SCCA (squamous cell carcinoma) of skin 06/13/2021  ? Right Shoulder - anterior (moderately differentiated) (tx p bx)  ? SCCA (squamous cell carcinoma) of skin 06/13/2021  ? Right Thigh - anterior (well differentiated) (tx p bx)  ? SCCA (squamous cell carcinoma) of skin 06/13/2021  ? Right Lower Leg - anterior (well differentiated) (tx p bx)  ? Squamous cell carcinoma of skin 04/22/2018  ? KA-Right mid chest (txpbx), KA-left elbow crease (txpbx), insitu-Right mid chest inf. (exc)  ? Squamous cell carcinoma of skin 05/20/2018  ? well diff-Left upper shin (txpbx), well diff-Right lower forearm (txpbx), well diff-Right upper shin (txpbx)  ? Squamous cell carcinoma of skin 06/11/2018  ? Scc + margin-Right mid chest inferior   ? Squamous cell carcinoma of skin 06/27/2018  ? well diff-Left mid thigh(txpbx), well diff-Left inner thigh (txpbx),insitu-Right cheek (txpbx),well diff-right inner heel (txpbx)  ? Squamous cell carcinoma of skin 09/16/2018  ? well diff-left shoulder (txpbx), well diff-Right chin (txpbx), well diff-right chest lateral (CX35FU)  ? Squamous cell carcinoma of skin 10/01/2018  ? Right outer lower shin (Txpbx)  ? Squamous cell carcinoma of skin 04/03/2019  ? well diff-Right center chest (MOHS), in situ-Right ear  ? Squamous cell carcinoma of skin 08/05/2019  ? in situ-Left calf (txpbx), in situ-left bicep (txpbx), well diff-Left chest,inf(txpbx), in situ-Right chest inf-(txpbx)  ? Squamous cell carcinoma of skin 11/19/2019  ? KA-left top  leg (txpbx), modify-Riight forehead-(mohs), in situ-right hand (txpbx), in situ-Right forearm (txpbx), well diff-Right chest (txpbx), well diff-chin (txpbx)  ? Squamous cell carcinoma of skin 01/06/2020  ? KA- Left top leg  ? Squamous cell carcinoma of skin 05/12/2013  ? bowens-middle of chest (CX35FU)  ? Squamous cell carcinoma of skin 05/18/2015  ? well diff-Left  upper arm (CX35FU + Exc),KA-right chest(txpbx), in situ-Left shin (txpbx), well diff-Right cheek (CX35FU), KA-Left post scalp (CX35FU)  ? Squamous cell carcinoma of skin 08/09/2015  ? KA-Left post scalp ((MOHS), in situ- mid chest (Txpbx +exc), in situ-Left upper arm inferior (txpbx)  ? Squamous cell carcinoma of skin 10/13/2015  ? Left upper arm-clear  ? Squamous cell carcinoma of skin 03/09/2016  ? mod diff-mid chest (txpbx+ exc), mod diff-Right chest (txpbx+exc), well diff-right cheek-(txpbx),well diff-Left hand-(txpbx), in situ-Left upper arm (txpbx), well diff-Right cheek -(txpbx), well diff-Right crease arm (txpbx)   ? Squamous cell carcinoma of skin 05/24/2016  ? well diff-Right nasal crease-(MOHS)  ? Squamous cell carcinoma of skin 08/02/2016  ? KA-Left chest med (txpbx)  ? Squamous cell carcinoma of skin 08/30/2016  ? in situ-Left outer zygoma (txpbx)  ? Squamous cell carcinoma of skin 12/06/2016  ? well diff-Left chest sup, Left shoudler, insitu- right post scalp  ? Squamous cell carcinoma of skin 02/14/2017  ? well diff-Left forearm (EXC),in situ-RIght ant neck  ? Squamous cell carcinoma of skin 06/14/2017  ? in situ-Right forearm (txpbx), in situ-Right chest (txpbx), well diff-left chest (txpbx), well diff-anterior neck- (txpbx)  ? Squamous cell carcinoma of skin 08/07/2017  ? well diff-Left upper shoulder (txpbx), sup and invasive-Left temple (txpbx), well diff-Right upper shin (txpbx), in situ-Right clavicle (txpbx)  ? Squamous cell carcinoma of skin 10/17/2017  ? well diff-ant. neck (MOHS), in situ-Right chest, inf (txpbx)  ? Squamous cell carcinoma of skin 01/21/2018  ? well diff- Right chest,ulnar (txpbx), well diff- right upper chest (txpbx), in situ-Right ant. crown (txpbx)  ? Squamous cell carcinoma of skin 08/02/2020  ? well diff-left lower leg-inferior (Txpbx)  ? Squamous cell carcinoma of skin 08/02/2020  ? well diff-right lower leg-mid (txpbx)  ? Squamous cell carcinoma of skin 08/02/2020  ?  well diff-left chest upper  ? Squamous cell carcinoma of skin 08/02/2020  ? well diff-mid parietal scalp (MOHS)  ? Squamous cell carcinoma of skin 08/02/2020  ? well diff-right foot inner(txpbx)  ? Squamous cell carcinoma of skin 08/02/2020  ? well diff- left lower leg medial (txpbx)  ? Squamous cell carcinoma of skin 08/02/2020  ? well diff-left lower leg anterior (txpbx)  ? Squamous cell carcinoma of skin 08/02/2020  ? well diff-left lower leg medial (txpbx)  ? Squamous cell carcinoma of skin 08/02/2020  ? well diff-right forearm-posterior (txpbx)  ? ? ?Past Surgical History:  ?Procedure Laterality Date  ? ABDOMINAL HERNIA REPAIR    ? Patient's states that she has had 8- 9 hernia surgeries  ? ABDOMINAL HYSTERECTOMY    ? BIOPSY  02/15/2021  ? Procedure: BIOPSY;  Surgeon: Rogene Houston, MD;  [facility]: [facility];  Service: Endoscopy;;  ? BIOPSY  12/20/2021  ? Procedure: BIOPSY;  Surgeon: Rogene Houston, MD;  [facility]: [facility];  Service: Endoscopy;;  ? CATARACT EXTRACTION W/PHACO Right 01/15/2020  ? Procedure: CATARACT EXTRACTION PHACO AND INTRAOCULAR LENS PLACEMENT (IOC);  Surgeon: Baruch Goldmann, MD;  Location: [facility];  Service: Ophthalmology;  Laterality: Right;  CDE: 7.89  ? CATARACT EXTRACTION W/PHACO Left 01/29/2020  ? Procedure:  CATARACT EXTRACTION PHACO AND INTRAOCULAR LENS PLACEMENT (IOC) (CDE: 6.33);  Surgeon: Baruch Goldmann, MD;  Location: [facility];  Service: Ophthalmology;  Laterality: Left;  ? CHOLECYSTECTOMY  2007  ? COLON SURGERY  2008  ? Done at [facility]  ? COLONOSCOPY    ? Done at [facility]  ? ESOPHAGOGASTRODUODENOSCOPY N/A 08/21/2018  ? Procedure: ESOPHAGOGASTRODUODENOSCOPY (EGD);  Surgeon: Rogene Houston, MD;  [facility];  Service: Endoscopy;  Laterality: N/A;  ? ESOPHAGOGASTRODUODENOSCOPY (EGD) WITH PROPOFOL N/A 12/16/2019  ? Procedure: ESOPHAGOGASTRODUODENOSCOPY (EGD) WITH PROPOFOL;  Surgeon: Rogene Houston, MD;  [facility];  Service: Endoscopy;  Laterality: N/A;  ?  ESOPHAGOGASTRODUODENOSCOPY (EGD) WITH PROPOFOL N/A 12/20/2021  ? Procedure: ESOPHAGOGASTRODUODENOSCOPY (EGD) WITH PROPOFOL;  Surgeon: Rogene Houston, MD;  [facility];  Service: Endoscopy;  Lateral

## 2022-02-14 NOTE — Op Note (Signed)
[facility] ?Patient Name: Cassidy Barber ?Procedure Date: 02/14/2022 7:27 AM ?MRN: [id number] ?Date of Birth: 29-Jun-1951 ?Attending MD: Hildred Laser , MD ?CSN: [id number] ?Age: 71 ?Admit Type: Outpatient ?Procedure:                Upper GI endoscopy ?Indications:              Therapeutic procedure, Iron deficiency anemia  ?                          secondary to chronic blood loss ?Providers:                Hildred Laser, MD, [place] Page, Raphael Gibney,  ?                          Technician ?Referring MD:             Theador Hawthorne. Hawks. FNP ?Medicines:                Propofol per Anesthesia ?Complications:            No immediate complications. ?Estimated Blood Loss:     Estimated blood loss: none. ?Procedure:                Pre-Anesthesia Assessment: ?                          - Prior to the procedure, a History and Physical  ?                          was performed, and patient medications and  ?                          allergies were reviewed. The patient's tolerance of  ?                          previous anesthesia was also reviewed. The risks  ?                          and benefits of the procedure and the sedation  ?                          options and risks were discussed with the patient.  ?                          All questions were answered, and informed consent  ?                          was obtained. Prior Anticoagulants: The patient has  ?                          taken no previous anticoagulant or antiplatelet  ?                          agents. ASA Grade Assessment: III - A patient with  ?  severe systemic disease. After reviewing the risks  ?                          and benefits, the patient was deemed in  ?                          satisfactory condition to undergo the procedure. ?                          - Prior to the procedure, a History and Physical  ?                          was performed, and patient medications and  ?                          allergies were  reviewed. The patient's tolerance of  ?                          previous anesthesia was also reviewed. The risks  ?                          and benefits of the procedure and the sedation  ?                          options and risks were discussed with the patient.  ?                          All questions were answered, and informed consent  ?                          was obtained. Prior Anticoagulants: The patient has  ?                          taken no previous anticoagulant or antiplatelet  ?                          agents. ASA Grade Assessment: III - A patient with  ?                          severe systemic disease. After reviewing the risks  ?                          and benefits, the patient was deemed in  ?                          satisfactory condition to undergo the procedure. ?                          After obtaining informed consent, the endoscope was  ?                          passed under direct vision. Throughout the  ?  procedure, the patient's blood pressure, pulse, and  ?                          oxygen saturations were monitored continuously. The  ?                          GIF-H190 ([id number]) scope was introduced through the  ?                          mouth, and advanced to the second part of duodenum.  ?                          The upper GI endoscopy was accomplished without  ?                          difficulty. The patient tolerated the procedure  ?                          well. ?Scope In: 7:29:09 AM ?Scope Out: 7:47:16 AM ?Total Procedure Duration: 0 hours 18 minutes 7 seconds  ?Findings: ?     The hypopharynx was normal. ?     The examined esophagus was normal. ?     The Z-line was regular and was found 39 cm from the incisors. ?     Moderate gastric antral vascular ectasia with bleeding was present in  ?     the gastric antrum and in the prepyloric region of the stomach.  ?     Coagulation for bleeding prevention using argon plasma was successful. ?      The exam of the stomach was otherwise normal. ?     A single 12 mm semi-sessile polyp with no bleeding was found in the  ?     duodenal bulb. Area was successfully injected with 5 mL [place] for  ?     lesion assessment, and this injection appeared to lift the lesion  ?     adequately. The polyp was removed with a hot snare. Resection and  ?     retrieval were complete. The pathology specimen was placed into Bottle  ?     Number 1. ?     The second portion of the duodenum was normal. ?Impression:               - Normal hypopharynx. ?                          - Normal esophagus. ?                          - Z-line regular, 39 cm from the incisors. ?                          - Gastric antral vascular ectasia with bleeding.  ?                          Treated with argon plasma coagulation (APC). ?                          -  A single duodenal polyp. Resected and retrieved.  ?                          Injected. ?                          - Normal second portion of the duodenum. Biopsied. ?Moderate Sedation: ?     Per Anesthesia Care ?Recommendation:           - Patient has a contact number available for  ?                          emergencies. The signs and symptoms of potential  ?                          delayed complications were discussed with the  ?                          patient. Return to normal activities tomorrow.  ?                          Written discharge instructions were provided to the  ?                          patient. ?                          - Full liquid diet today. ?                          - Continue present medications. ?                          - No aspirin, ibuprofen, naproxen, or other  ?                          non-steroidal anti-inflammatory drugs for 7 days. ?                          - No aspirin, ibuprofen, naproxen, or other  ?                          non-steroidal anti-inflammatory drugs for 7 days. ?                          - Await pathology results. ?                           - Repeat upper endoscopy in 3 months. ?Procedure Code(s):        --- Professional --- ?                          [id number], Esophagogastroduodenoscopy, flexible,  ?                          transoral; with removal of tumor(s), polyp(s), or  ?  other lesion(s) by snare technique ?Diagnosis Code(s):        --- Professional --- ?                          V03.500, Angiodysplasia of stomach and duodenum  ?                          with bleeding ?                          K31.7, Polyp of stomach and duodenum ?                          D50.0, Iron deficiency anemia secondary to blood  ?                          loss (chronic) ?CPT copyright 2019 American Medical Association. All rights reserved. ?The codes documented in this report are preliminary and upon coder review may  ?be revised to meet current compliance requirements. ?Hildred Laser, MD ?Hildred Laser, MD ?02/14/2022 7:59:29 AM ?This report has been signed electronically. ?Number of Addenda: 0 ?

## 2022-02-14 NOTE — Anesthesia Preprocedure Evaluation (Signed)
Anesthesia Evaluation  ?Patient identified by MRN, date of birth, ID band ?Patient awake ? ? ? ?Reviewed: ?Allergy & Precautions, NPO status , Patient's Chart, lab work & pertinent test results ? ?Airway ?Mallampati: II ? ?TM Distance: >3 FB ?Neck ROM: Full ? ? ? Dental ? ?(+) Dental Advisory Given, Missing ?  ?Pulmonary ?neg pulmonary ROS,  ?  ?Pulmonary exam normal ?breath sounds clear to auscultation ? ? ? ? ? ? Cardiovascular ?hypertension, Pt. on medications ?Normal cardiovascular exam ?Rhythm:Regular Rate:Normal ? ? ?  ?Neuro/Psych ?Seizures -, Well Controlled,  PSYCHIATRIC DISORDERS Anxiety   ? GI/Hepatic ?GERD  Medicated and Controlled,(+) Cirrhosis  (s/p liver transplant) ?  ?  ? , Hepatitis -  ?Endo/Other  ?Hypothyroidism  ? Renal/GU ?Renal disease  ?negative genitourinary ?  ?Musculoskeletal ?negative musculoskeletal ROS ?(+)  ? Abdominal ?  ?Peds ?negative pediatric ROS ?(+)  Hematology ? ?(+) Blood dyscrasia, anemia ,   ?Anesthesia Other Findings ? ? Reproductive/Obstetrics ?negative OB ROS ? ?  ? ? ? ? ? ? ? ? ? ? ? ? ? ?  ?  ? ? ? ? ? ? ? ?Anesthesia Physical ?Anesthesia Plan ? ?ASA: 3 ? ?Anesthesia Plan: General  ? ?Post-op Pain Management: Minimal or no pain anticipated  ? ?Induction: Intravenous ? ?PONV Risk Score and Plan: TIVA ? ?Airway Management Planned: Nasal Cannula and Natural Airway ? ?Additional Equipment:  ? ?Intra-op Plan:  ? ?Post-operative Plan:  ? ?Informed Consent: I have reviewed the patients History and Physical, chart, labs and discussed the procedure including the risks, benefits and alternatives for the proposed anesthesia with the patient or authorized representative who has indicated his/her understanding and acceptance.  ? ? ? ?Dental advisory given ? ?Plan Discussed with: CRNA and Surgeon ? ?Anesthesia Plan Comments:   ? ? ? ? ? ? ?Anesthesia Quick Evaluation ? ?

## 2022-02-14 NOTE — Anesthesia Postprocedure Evaluation (Signed)
Anesthesia Post Note ? ?Patient: Cassidy Barber ? ?Procedure(s) Performed: ESOPHAGOGASTRODUODENOSCOPY (EGD) WITH PROPOFOL ?POLYPECTOMY ?GI RADIOFREQUENCY ABLATION ? ?Patient location during evaluation: Phase II ?Anesthesia Type: General ?Level of consciousness: awake and alert and oriented ?Pain management: pain level controlled ?Vital Signs Assessment: post-procedure vital signs reviewed and stable ?Respiratory status: spontaneous breathing, nonlabored ventilation and respiratory function stable ?Cardiovascular status: blood pressure returned to baseline and stable ?Postop Assessment: no apparent nausea or vomiting ?Anesthetic complications: no ? ? ?No notable events documented. ? ? ?Last Vitals:  ?Vitals:  ? 02/14/22 0644 02/14/22 0753  ?BP: (!) 142/77 115/68  ?Pulse: 67   ?Resp: 18 14  ?Temp: (!) 36.4 ?C 36.5 ?C  ?SpO2: 99% 96%  ?  ?Last Pain:  ?Vitals:  ? 02/14/22 0753  ?TempSrc: Axillary  ?PainSc: 0-No pain  ? ? ?  ?  ?  ?  ?  ?  ? ?Marvelous Bouwens C [name] ? ? ? ? ?

## 2022-02-14 NOTE — Discharge Instructions (Addendum)
No aspirin or NSAIDs for 1 week. ?Resume scheduled medications as before. ?Full liquids today and usual diet starting tomorrow morning. ?No driving for 24 hours. ?Physician will call with biopsy results. ?

## 2022-02-14 NOTE — Transfer of Care (Signed)
Immediate Anesthesia Transfer of Care Note ? ?Patient: Cassidy Barber ? ?Procedure(s) Performed: ESOPHAGOGASTRODUODENOSCOPY (EGD) WITH PROPOFOL ?POLYPECTOMY ?GI RADIOFREQUENCY ABLATION ? ?Patient Location: Short Stay ? ?Anesthesia Type:MAC ? ?Level of Consciousness: awake, alert , oriented and patient cooperative ? ?Airway & Oxygen Therapy: Patient Spontanous Breathing ? ?Post-op Assessment: Report given to RN, Post -op Vital signs reviewed and stable and Patient moving all extremities ? ?Post vital signs: Reviewed and stable ? ?Last Vitals:  ?Vitals Value Taken Time  ?BP    ?Temp    ?Pulse    ?Resp    ?SpO2    ? ? ?Last Pain:  ?Vitals:  ? 02/14/22 0725  ?TempSrc:   ?PainSc: 0-No pain  ?   ? ?Patients Stated Pain Goal: 5 (02/14/22 3785) ? ?Complications: No notable events documented. ?

## 2022-02-15 ENCOUNTER — Other Ambulatory Visit: Payer: Self-pay | Admitting: Family

## 2022-02-15 DIAGNOSIS — M5432 Sciatica, left side: Secondary | ICD-10-CM

## 2022-02-15 LAB — SURGICAL PATHOLOGY

## 2022-02-20 ENCOUNTER — Ambulatory Visit: Payer: Medicare Other | Admitting: Physician Assistant

## 2022-02-20 ENCOUNTER — Encounter (HOSPITAL_COMMUNITY): Payer: Self-pay | Admitting: Internal Medicine

## 2022-02-20 DIAGNOSIS — D044 Carcinoma in situ of skin of scalp and neck: Secondary | ICD-10-CM | POA: Diagnosis not present

## 2022-02-20 DIAGNOSIS — D0462 Carcinoma in situ of skin of left upper limb, including shoulder: Secondary | ICD-10-CM | POA: Diagnosis not present

## 2022-02-20 DIAGNOSIS — D485 Neoplasm of uncertain behavior of skin: Secondary | ICD-10-CM

## 2022-02-20 DIAGNOSIS — D045 Carcinoma in situ of skin of trunk: Secondary | ICD-10-CM

## 2022-02-20 NOTE — Progress Notes (Signed)
NTS Suture removal, No s/s of infection, path to pt. 

## 2022-02-20 NOTE — Patient Instructions (Addendum)
Release:  ?[id number] ? ? ?Biopsy, Surgery (Curettage) & Surgery (Excision) Aftercare Instructions ? ?1. Okay to remove bandage in 24 hours ? ?2. Wash area with soap and water ? ?3. Apply Vaseline to area twice daily until healed (Not Neosporin) ? ?4. Okay to cover with a Band-Aid to decrease the chance of infection or prevent irritation from clothing; also it's okay to uncover lesion at home. ? ?5. Suture instructions: return to our office in 7-10 or 10-14 days for a nurse visit for suture removal. Variable healing with sutures, if pain or itching occurs call our office. It's okay to shower or bathe 24 hours after sutures are given. ? ?6. The following risks may occur after a biopsy, curettage or excision: bleeding, scarring, discoloration, recurrence, infection (redness, yellow drainage, pain or swelling). ? ?7. For questions, concerns and results call our office at [facility] before 4pm & Friday before 3pm. Biopsy results will be available in 1 week. ? ?

## 2022-02-22 ENCOUNTER — Encounter: Payer: Medicare Other | Admitting: Physician Assistant

## 2022-02-26 ENCOUNTER — Encounter: Payer: Self-pay | Admitting: Physician Assistant

## 2022-02-26 NOTE — Progress Notes (Signed)
? ?Follow-Up Visit ?  ?Subjective  ?Cassidy Barber is a 71 y.o. female who presents for the following: Procedure (Patient here today for 2 week follow up per patient she now has a new lesion on her scalp in her previous MOH's surgical site). ? ? ?The following portions of the chart were reviewed this encounter and updated as appropriate:  Tobacco  Allergies  Meds  Problems  Med Hx  Surg Hx  Fam Hx   ?  ? ?Objective  ?Well appearing patient in no apparent distress; mood and affect are within normal limits. ? ?A full examination was performed including scalp, head, eyes, ears, nose, lips, neck, chest, axillae, abdomen, back, buttocks, bilateral upper extremities, bilateral lower extremities, hands, feet, fingers, toes, fingernails, and toenails. All findings within normal limits unless otherwise noted below. ? ?Mid Parietal Scalp ?Thick brown scale within the area of radiation. ? ? ? ? ? ? ?Left Temple ?Volcano growth on pink base  ? ? ? ? ?Right Forearm Distal ?Volcano growth on pink base  ? ? ? ? ? ?Assessment & Plan  ?SCC (squamous cell carcinoma), scalp/neck ?Mid Parietal Scalp ? ?Skin / nail biopsy ?Type of biopsy: tangential   ?Informed consent: discussed and consent obtained   ?Timeout: patient name, date of birth, surgical site, and procedure verified   ?Procedure prep:  Patient was prepped and draped in usual sterile fashion (Non sterile) ?Prep type:  Chlorhexidine ?Anesthesia: the lesion was anesthetized in a standard fashion   ?Anesthetic:  1% lidocaine w/ epinephrine 1-100,000 local infiltration ?Instrument used: flexible razor blade   ?Hemostasis achieved with: aluminum chloride and electrodesiccation   ?Outcome: patient tolerated procedure well   ?Post-procedure details: sterile dressing applied and wound care instructions given   ?Dressing type: bandage and petrolatum   ? ?Destruction of lesion ?Complexity: simple   ?Destruction method: electrodesiccation and curettage   ?Informed consent:  discussed and consent obtained   ?Timeout:  patient name, date of birth, surgical site, and procedure verified ?Anesthesia: the lesion was anesthetized in a standard fashion   ?Anesthetic:  1% lidocaine w/ epinephrine 1-100,000 local infiltration ?Curettage performed in three different directions: Yes   ?Electrodesiccation performed over the curetted area: Yes   ?Curettage cycles:  3 ?Margin per side (cm):  0.1 ?Final wound size (cm):  0.9 ?Hemostasis achieved with:  aluminum chloride ?Outcome: patient tolerated procedure well with no complications   ?Post-procedure details: sterile dressing applied and wound care instructions given   ?Dressing type: petrolatum   ? ?Specimen 1 - Surgical pathology ?Differential Diagnosis: R/O BCC vs SCC- treated after biopsy ? ?[phone] ? ?Check Margins: Yes ? ?SCC (squamous cell carcinoma), face ?Left Temple ? ?Skin / nail biopsy ?Type of biopsy: punch   ?Informed consent: discussed and consent obtained   ?Timeout: patient name, date of birth, surgical site, and procedure verified   ?Procedure prep:  Patient was prepped and draped in usual sterile fashion (Non sterile) ?Prep type:  Chlorhexidine ?Anesthesia: the lesion was anesthetized in a standard fashion   ?Anesthetic:  1% lidocaine w/ epinephrine 1-100,000 local infiltration ?Punch size:  8 mm ?Suture size:  5-0 ?Suture type: nylon   ?Suture removal (days):  13 ?Hemostasis achieved with: suture   ?Outcome: patient tolerated procedure well   ?Post-procedure details: sterile dressing applied and wound care instructions given   ?Dressing type: bandage and petrolatum   ?Additional details:  5-0 x 2 ? ?Specimen 2 - Surgical pathology ?Differential Diagnosis: R/O BCC vs SCC -  punch  ? ?[id number] ? ?Check Margins: Yes ? ?SCC (squamous cell carcinoma), arm, right ?Right Forearm Distal ? ?Skin / nail biopsy ?Type of biopsy: punch   ?Informed consent: discussed and consent obtained   ?Timeout: patient name, date of birth, surgical  site, and procedure verified   ?Procedure prep:  Patient was prepped and draped in usual sterile fashion (Non sterile) ?Prep type:  Chlorhexidine ?Anesthesia: the lesion was anesthetized in a standard fashion   ?Anesthetic:  1% lidocaine w/ epinephrine 1-100,000 local infiltration ?Punch size:  8 mm ?Suture size:  4-0 ?Suture type: nylon   ?Suture removal (days):  13 ?Hemostasis achieved with: suture   ?Outcome: patient tolerated procedure well   ?Additional details:  4-0 Nylon x 2 ? ?Specimen 3 - Surgical pathology ?Differential Diagnosis: R/O BCC vs SCC - punch ? ?[id number] ? ?Check Margins: Yes ? ? ? ?I, Kiasha Bellin, PA-C, have reviewed all documentation's for this visit.  The documentation on 02/26/22 for the exam, diagnosis, procedures and orders are all accurate and complete. ?

## 2022-02-27 NOTE — Progress Notes (Signed)
? ?Follow-Up Visit ?  ?Subjective  ?Cassidy Barber is a 71 y.o. female who presents for the following: Skin Problem (Here for suture removal & lesions checked). ? ? ?The following portions of the chart were reviewed this encounter and updated as appropriate:  Tobacco  Allergies  Meds  Problems  Med Hx  Surg Hx  Fam Hx   ?  ? ?Objective  ?Well appearing patient in no apparent distress; mood and affect are within normal limits. ? ?A full examination was performed including scalp, head, eyes, ears, nose, lips, neck, chest, axillae, abdomen, back, buttocks, bilateral upper extremities, bilateral lower extremities, hands, feet, fingers, toes, fingernails, and toenails. All findings within normal limits unless otherwise noted below. ? ?Superior Left Forearm - Posterior ?Volcano growth on pink base  ? ? ? ? ? ? ?Medial Left Forearm - Posterior ?Volcano growth on pink base  ? ? ? ? ? ? ?Lateral Left Forearm - Posterior ?Hyperkeratotic scale with pink base  ? ? ? ? ? ? ?Left Supraclavicular Area ?Hyperkeratotic scale with pink base  ? ? ? ? ? ? ? ?Assessment & Plan  ?Neoplasm of uncertain behavior of skin (4) ?Superior Left Forearm - Posterior ? ?Skin / nail biopsy ?Type of biopsy: tangential   ?Informed consent: discussed and consent obtained   ?Timeout: patient name, date of birth, surgical site, and procedure verified   ?Procedure prep:  Patient was prepped and draped in usual sterile fashion (Non sterile) ?Prep type:  Chlorhexidine ?Anesthesia: the lesion was anesthetized in a standard fashion   ?Anesthetic:  1% lidocaine w/ epinephrine 1-100,000 local infiltration ?Instrument used: flexible razor blade   ?Hemostasis achieved with: aluminum chloride and electrodesiccation   ?Outcome: patient tolerated procedure well   ?Post-procedure details: sterile dressing applied and wound care instructions given   ?Dressing type: petrolatum and bandage   ? ?Destruction of lesion ?Complexity: simple   ?Destruction method:  electrodesiccation and curettage   ?Informed consent: discussed and consent obtained   ?Timeout:  patient name, date of birth, surgical site, and procedure verified ?Anesthesia: the lesion was anesthetized in a standard fashion   ?Anesthetic:  1% lidocaine w/ epinephrine 1-100,000 local infiltration ?Curettage performed in three different directions: Yes   ?Electrodesiccation performed over the curetted area: Yes   ?Curettage cycles:  3 ?Margin per side (cm):  0.1 ?Final wound size (cm):  1.1 ?Hemostasis achieved with:  aluminum chloride and electrodesiccation ?Outcome: patient tolerated procedure well with no complications   ?Post-procedure details: sterile dressing applied and wound care instructions given   ?Dressing type: bandage and petrolatum   ? ?Specimen 2 - Surgical pathology ?Differential Diagnosis: R/O BCC vs SCC - treated after biopsy ? ?Check Margins: Yes ? ?Medial Left Forearm - Posterior ? ?Skin / nail biopsy ?Type of biopsy: punch   ?Informed consent: discussed and consent obtained   ?Timeout: patient name, date of birth, surgical site, and procedure verified   ?Procedure prep:  Patient was prepped and draped in usual sterile fashion (Non sterile) ?Prep type:  Chlorhexidine ?Anesthesia: the lesion was anesthetized in a standard fashion   ?Anesthetic:  1% lidocaine w/ epinephrine 1-100,000 local infiltration ?Punch size:  8 mm ?Suture size:  4-0 ?Suture type: nylon   ?Suture removal (days):  14 ?Hemostasis achieved with: suture   ?Outcome: patient tolerated procedure well   ?Post-procedure details: sterile dressing applied and wound care instructions given   ?Dressing type: bandage and petrolatum   ?Additional details:  4-0 Nylon x  2  ? ?Specimen 3 - Surgical pathology ?Differential Diagnosis: R/O BCC vs SCC - punch ? ?Check Margins: No ? ?Lateral Left Forearm - Posterior ? ?Skin / nail biopsy ?Type of biopsy: punch   ?Informed consent: discussed and consent obtained   ?Timeout: patient name, date of  birth, surgical site, and procedure verified   ?Procedure prep:  Patient was prepped and draped in usual sterile fashion (Non sterile) ?Prep type:  Chlorhexidine ?Anesthesia: the lesion was anesthetized in a standard fashion   ?Anesthetic:  1% lidocaine w/ epinephrine 1-100,000 local infiltration ?Punch size:  8 mm ?Suture size:  4-0 ?Suture type: nylon   ?Suture removal (days):  14 ?Hemostasis achieved with: suture   ?Outcome: patient tolerated procedure well   ?Post-procedure details: sterile dressing applied and wound care instructions given   ?Dressing type: bandage and petrolatum   ?Additional details:  4-0 Nylon x 2  ? ?Specimen 4 - Surgical pathology ?Differential Diagnosis: R/O BCC vs SCC - punch ? ?Check Margins: No ? ?Left Supraclavicular Area ? ?Skin / nail biopsy ?Type of biopsy: tangential   ?Informed consent: discussed and consent obtained   ?Timeout: patient name, date of birth, surgical site, and procedure verified   ?Procedure prep:  Patient was prepped and draped in usual sterile fashion (Non sterile) ?Prep type:  Chlorhexidine ?Anesthesia: the lesion was anesthetized in a standard fashion   ?Anesthetic:  1% lidocaine w/ epinephrine 1-100,000 local infiltration ?Instrument used: flexible razor blade   ?Hemostasis achieved with: aluminum chloride and electrodesiccation   ?Outcome: patient tolerated procedure well   ?Post-procedure details: sterile dressing applied and wound care instructions given   ?Dressing type: bandage and petrolatum   ? ?Destruction of lesion ?Complexity: simple   ?Destruction method: electrodesiccation and curettage   ?Informed consent: discussed and consent obtained   ?Timeout:  patient name, date of birth, surgical site, and procedure verified ?Anesthesia: the lesion was anesthetized in a standard fashion   ?Anesthetic:  1% lidocaine w/ epinephrine 1-100,000 local infiltration ?Curettage performed in three different directions: Yes   ?Electrodesiccation performed over the  curetted area: Yes   ?Curettage cycles:  3 ?Margin per side (cm):  0.1 ?Final wound size (cm):  3 ?Hemostasis achieved with:  aluminum chloride and electrodesiccation ?Outcome: patient tolerated procedure well with no complications   ?Post-procedure details: sterile dressing applied and wound care instructions given   ?Dressing type: bandage and petrolatum   ? ?Specimen 1 - Surgical pathology ?Differential Diagnosis: R/O BCC vs SCC - treated after biopsy ? ?Check Margins: No ? ? ? ?I, Aaron Bostwick, PA-C, have reviewed all documentation's for this visit.  The documentation on 02/27/22 for the exam, diagnosis, procedures and orders are all accurate and complete. ?

## 2022-03-01 DIAGNOSIS — Z1231 Encounter for screening mammogram for malignant neoplasm of breast: Secondary | ICD-10-CM | POA: Diagnosis not present

## 2022-03-05 ENCOUNTER — Other Ambulatory Visit: Payer: Self-pay | Admitting: Physician Assistant

## 2022-03-06 ENCOUNTER — Ambulatory Visit: Payer: Medicare Other | Admitting: Physician Assistant

## 2022-03-06 ENCOUNTER — Encounter: Payer: Self-pay | Admitting: Physician Assistant

## 2022-03-06 DIAGNOSIS — D045 Carcinoma in situ of skin of trunk: Secondary | ICD-10-CM

## 2022-03-06 DIAGNOSIS — L578 Other skin changes due to chronic exposure to nonionizing radiation: Secondary | ICD-10-CM

## 2022-03-06 DIAGNOSIS — Z5181 Encounter for therapeutic drug level monitoring: Secondary | ICD-10-CM

## 2022-03-06 DIAGNOSIS — D485 Neoplasm of uncertain behavior of skin: Secondary | ICD-10-CM

## 2022-03-06 DIAGNOSIS — C44320 Squamous cell carcinoma of skin of unspecified parts of face: Secondary | ICD-10-CM

## 2022-03-06 DIAGNOSIS — Z944 Liver transplant status: Secondary | ICD-10-CM

## 2022-03-06 DIAGNOSIS — C44729 Squamous cell carcinoma of skin of left lower limb, including hip: Secondary | ICD-10-CM | POA: Diagnosis not present

## 2022-03-06 DIAGNOSIS — C44329 Squamous cell carcinoma of skin of other parts of face: Secondary | ICD-10-CM | POA: Diagnosis not present

## 2022-03-06 DIAGNOSIS — Z79899 Other long term (current) drug therapy: Secondary | ICD-10-CM

## 2022-03-06 DIAGNOSIS — D0472 Carcinoma in situ of skin of left lower limb, including hip: Secondary | ICD-10-CM

## 2022-03-06 NOTE — Patient Instructions (Signed)

## 2022-03-06 NOTE — Progress Notes (Addendum)
? ?Follow-Up Visit ?  ?Subjective  ?Cassidy Barber is a 71 y.o. female who presents for the following: Follow-up (2 week follow up. ). ? ? ?The following portions of the chart were reviewed this encounter and updated as appropriate:  Tobacco  Allergies  Meds  Problems  Med Hx  Surg Hx  Fam Hx   ?  ? ?Objective  ?Well appearing patient in no apparent distress; mood and affect are within normal limits. ? ?A full examination was performed including scalp, head, eyes, ears, nose, lips, neck, chest, axillae, abdomen, back, buttocks, bilateral upper extremities, bilateral lower extremities, hands, feet, fingers, toes, fingernails, and toenails. All findings within normal limits unless otherwise noted below. ? ?Left Lower Leg - Anterior inferior ?Volcano growth on pink base  ? ? ? ? ? ? ?Left Temple ?Skin toned nodule ? ? ? ? ?Right Breast ?Thick pink plaque ? ? ? ? ? ? ?Left Lower Leg - Anterior superior ?Volcano growth on pink base  ? ? ? ? ? ? ?Left Lower Leg - Anterior medial ?Volcano growth on pink base  ? ? ? ? ? ? ?Chest - Medial [facility]), Left Forearm - Anterior, Right Forearm - Anterior, Right Lower Leg - Anterior ?Diffuse sun damage ? ? ?Assessment & Plan  ?Drug therapy ? ?Related Procedures ?Lipid Panel ?CMP ? ?SCC (squamous cell carcinoma), leg, left ?Left Lower Leg - Anterior inferior ? ?Skin / nail biopsy ?Type of biopsy: tangential   ?Informed consent: discussed and consent obtained   ?Timeout: patient name, date of birth, surgical site, and procedure verified   ?Anesthesia: the lesion was anesthetized in a standard fashion   ?Anesthetic:  1% lidocaine w/ epinephrine 1-100,000 local infiltration ?Instrument used: flexible razor blade   ?Hemostasis achieved with: ferric subsulfate   ?Outcome: patient tolerated procedure well   ?Post-procedure details: wound care instructions given   ? ?Destruction of lesion ?Complexity: simple   ?Destruction method: electrodesiccation and curettage   ?Informed consent:  discussed and consent obtained   ?Timeout:  patient name, date of birth, surgical site, and procedure verified ?Anesthesia: the lesion was anesthetized in a standard fashion   ?Anesthetic:  1% lidocaine w/ epinephrine 1-100,000 local infiltration ?Curettage performed in three different directions: Yes   ?Electrodesiccation performed over the curetted area: Yes   ?Curettage cycles:  3 ?Margin per side (cm):  0.1 ?Final wound size (cm):  0.9 ?Hemostasis achieved with:  aluminum chloride ?Outcome: patient tolerated procedure well with no complications   ?Post-procedure details: wound care instructions given   ? ?Specimen 3 - Surgical pathology ?Differential Diagnosis: bcc vs scc ?txpbx ?Check Margins: No ? ?SCC (squamous cell carcinoma), face ?Left Temple ? ?Skin / nail biopsy ?Type of biopsy: punch   ?Informed consent: discussed and consent obtained   ?Timeout: patient name, date of birth, surgical site, and procedure verified   ?Procedure prep:  Patient was prepped and draped in usual sterile fashion (Non sterile) ?Prep type:  Chlorhexidine ?Anesthesia: the lesion was anesthetized in a standard fashion   ?Anesthetic:  1% lidocaine w/ epinephrine 1-100,000 local infiltration ?Punch size:  5 mm ?Suture size:  5-0 ?Suture type: nylon   ?Suture removal (days):  8 ?Hemostasis achieved with: suture and ferric subsulfate   ?Outcome: patient tolerated procedure well   ?Post-procedure details: wound care instructions given   ?Additional details:  5-0 nylon x 2 ?1.5 cm ? ?Specimen 4 - Surgical pathology ?Differential Diagnosis: bcc vs scc ?[phone] ?Check Margins: No ? ?Carcinoma  in situ of skin of trunk ?Right Breast ? ?Skin / nail biopsy ?Type of biopsy: tangential   ?Informed consent: discussed and consent obtained   ?Timeout: patient name, date of birth, surgical site, and procedure verified   ?Anesthesia: the lesion was anesthetized in a standard fashion   ?Anesthetic:  1% lidocaine w/ epinephrine 1-100,000 local  infiltration ?Instrument used: flexible razor blade   ?Hemostasis achieved with: ferric subsulfate   ?Outcome: patient tolerated procedure well   ?Post-procedure details: wound care instructions given   ? ?Destruction of lesion ?Complexity: simple   ?Destruction method: electrodesiccation and curettage   ?Informed consent: discussed and consent obtained   ?Timeout:  patient name, date of birth, surgical site, and procedure verified ?Anesthesia: the lesion was anesthetized in a standard fashion   ?Anesthetic:  1% lidocaine w/ epinephrine 1-100,000 local infiltration ?Curettage performed in three different directions: Yes   ?Electrodesiccation performed over the curetted area: Yes   ?Curettage cycles:  3 ?Margin per side (cm):  0.1 ?Final wound size (cm):  2 ?Hemostasis achieved with:  aluminum chloride ?Outcome: patient tolerated procedure well with no complications   ?Post-procedure details: wound care instructions given   ? ?Specimen 5 - Surgical pathology ?Differential Diagnosis: bcc vs scc ?Txpbx ?X 2 specimen in bottle ?Check Margins: No ? ?Carcinoma in situ of skin of left lower extremity including hip (2) ?Left Lower Leg - Anterior superior ? ?Skin / nail biopsy ?Type of biopsy: tangential   ?Informed consent: discussed and consent obtained   ?Timeout: patient name, date of birth, surgical site, and procedure verified   ?Anesthesia: the lesion was anesthetized in a standard fashion   ?Anesthetic:  1% lidocaine w/ epinephrine 1-100,000 local infiltration ?Instrument used: flexible razor blade   ?Hemostasis achieved with: ferric subsulfate   ?Outcome: patient tolerated procedure well   ?Post-procedure details: wound care instructions given   ? ?Destruction of lesion ?Complexity: simple   ?Destruction method: electrodesiccation and curettage   ?Informed consent: discussed and consent obtained   ?Timeout:  patient name, date of birth, surgical site, and procedure verified ?Anesthesia: the lesion was anesthetized  in a standard fashion   ?Anesthetic:  1% lidocaine w/ epinephrine 1-100,000 local infiltration ?Curettage performed in three different directions: Yes   ?Electrodesiccation performed over the curetted area: Yes   ?Curettage cycles:  3 ?Margin per side (cm):  0.1 ?Final wound size (cm):  1.1 ?Hemostasis achieved with:  aluminum chloride ?Outcome: patient tolerated procedure well with no complications   ?Post-procedure details: wound care instructions given   ? ?Specimen 1 - Surgical pathology ?Differential Diagnosis: bcc vs scc ?txpbx ?Check Margins: No ? ?Left Lower Leg - Anterior medial ? ?Skin / nail biopsy ?Type of biopsy: tangential   ?Informed consent: discussed and consent obtained   ?Timeout: patient name, date of birth, surgical site, and procedure verified   ?Anesthesia: the lesion was anesthetized in a standard fashion   ?Anesthetic:  1% lidocaine w/ epinephrine 1-100,000 local infiltration ?Instrument used: flexible razor blade   ?Hemostasis achieved with: ferric subsulfate   ?Outcome: patient tolerated procedure well   ?Post-procedure details: wound care instructions given   ? ?Destruction of lesion ?Complexity: simple   ?Destruction method: electrodesiccation and curettage   ?Informed consent: discussed and consent obtained   ?Timeout:  patient name, date of birth, surgical site, and procedure verified ?Anesthesia: the lesion was anesthetized in a standard fashion   ?Anesthetic:  1% lidocaine w/ epinephrine 1-100,000 local infiltration ?Curettage performed in three different  directions: Yes   ?Electrodesiccation performed over the curetted area: Yes   ?Curettage cycles:  3 ?Margin per side (cm):  0.1 ?Final wound size (cm):  0.9 ?Hemostasis achieved with:  aluminum chloride ?Outcome: patient tolerated procedure well with no complications   ?Post-procedure details: wound care instructions given   ? ?Specimen 2 - Surgical pathology ?Differential Diagnosis: bcc vs scc ?txpbx ?Check Margins: No ? ?Actinic  skin damage (4) ?Left Forearm - Anterior; Right Forearm - Anterior; Right Lower Leg - Anterior; Chest - Medial [facility]) ? ?Continue Acetretin. Will check fasting triglyceride labs.  ? ?Related Procedures ?Lipid Pa

## 2022-03-10 DIAGNOSIS — Z944 Liver transplant status: Secondary | ICD-10-CM | POA: Diagnosis not present

## 2022-03-13 ENCOUNTER — Ambulatory Visit (INDEPENDENT_AMBULATORY_CARE_PROVIDER_SITE_OTHER): Payer: Medicare Other | Admitting: *Deleted

## 2022-03-13 DIAGNOSIS — Z4802 Encounter for removal of sutures: Secondary | ICD-10-CM

## 2022-03-13 NOTE — Progress Notes (Signed)
Here for nts for suture removal - left temple x 2- no sign or symptoms of infection- pathology to patient ( already has [facility] appointment scheduled).  ?

## 2022-03-20 ENCOUNTER — Encounter: Payer: Self-pay | Admitting: Physician Assistant

## 2022-03-22 NOTE — Addendum Note (Signed)
Addended by: Robyne Askew R on: 03/22/2022 12:13 PM ? ? Modules accepted: Level of Service ? ?

## 2022-03-23 ENCOUNTER — Other Ambulatory Visit: Payer: Self-pay | Admitting: Physician Assistant

## 2022-03-23 DIAGNOSIS — Z79899 Other long term (current) drug therapy: Secondary | ICD-10-CM | POA: Diagnosis not present

## 2022-03-23 DIAGNOSIS — Z944 Liver transplant status: Secondary | ICD-10-CM | POA: Diagnosis not present

## 2022-03-24 LAB — LIPID PANEL W/O CHOL/HDL RATIO
Cholesterol, Total: 196 mg/dL (ref 100–199)
HDL: 76 mg/dL (ref >39)
LDL Chol Calc (NIH): 96 mg/dL (ref 0–99)
Triglycerides: 142 mg/dL (ref 0–149)
VLDL Cholesterol Cal: 24 mg/dL (ref 5–40)

## 2022-03-26 ENCOUNTER — Telehealth: Payer: Self-pay

## 2022-03-26 NOTE — Telephone Encounter (Signed)
Phone call to patient with her lab results. Patient aware of results.  ?

## 2022-03-26 NOTE — Telephone Encounter (Signed)
-----   Message from [name], [place] sent at 03/26/2022  1:29 PM EDT ----- ?Labs look good. Recheck 8 weeks ?

## 2022-03-28 ENCOUNTER — Ambulatory Visit (INDEPENDENT_AMBULATORY_CARE_PROVIDER_SITE_OTHER): Payer: Medicare Other | Admitting: Physician Assistant

## 2022-03-28 ENCOUNTER — Encounter: Payer: Self-pay | Admitting: Physician Assistant

## 2022-03-28 DIAGNOSIS — Z1283 Encounter for screening for malignant neoplasm of skin: Secondary | ICD-10-CM | POA: Diagnosis not present

## 2022-03-28 DIAGNOSIS — Z4789 Encounter for other orthopedic aftercare: Secondary | ICD-10-CM | POA: Diagnosis not present

## 2022-03-28 DIAGNOSIS — Z85828 Personal history of other malignant neoplasm of skin: Secondary | ICD-10-CM

## 2022-03-28 MED ORDER — ACITRETIN 25 MG PO CAPS: 25.0000 mg | ORAL_CAPSULE | Freq: Every day | ORAL | 4 refills | Status: DC

## 2022-03-29 ENCOUNTER — Inpatient Hospital Stay (HOSPITAL_COMMUNITY): Payer: Medicare Other

## 2022-03-29 ENCOUNTER — Encounter: Payer: Self-pay | Admitting: Physician Assistant

## 2022-03-29 ENCOUNTER — Inpatient Hospital Stay (HOSPITAL_COMMUNITY): Payer: Medicare Other | Attending: Hematology

## 2022-03-29 DIAGNOSIS — Z85038 Personal history of other malignant neoplasm of large intestine: Secondary | ICD-10-CM | POA: Insufficient documentation

## 2022-03-29 DIAGNOSIS — Z944 Liver transplant status: Secondary | ICD-10-CM | POA: Diagnosis not present

## 2022-03-29 DIAGNOSIS — D509 Iron deficiency anemia, unspecified: Secondary | ICD-10-CM | POA: Diagnosis not present

## 2022-03-29 DIAGNOSIS — R5383 Other fatigue: Secondary | ICD-10-CM | POA: Insufficient documentation

## 2022-03-29 LAB — CBC WITH DIFFERENTIAL/PLATELET
Abs Immature Granulocytes: 0.01 10*3/uL (ref 0.00–0.07)
Basophils Absolute: 0.1 10*3/uL (ref 0.0–0.1)
Basophils Relative: 2 %
Eosinophils Absolute: 0.3 10*3/uL (ref 0.0–0.5)
Eosinophils Relative: 5 %
HCT: 41.7 % (ref 36.0–46.0)
Hemoglobin: 12.3 g/dL (ref 12.0–15.0)
Immature Granulocytes: 0 %
Lymphocytes Relative: 26 %
Lymphs Abs: 1.5 10*3/uL (ref 0.7–4.0)
MCH: 26.9 pg (ref 26.0–34.0)
MCHC: 29.5 g/dL — ABNORMAL LOW (ref 30.0–36.0)
MCV: 91 fL (ref 80.0–100.0)
Monocytes Absolute: 0.9 10*3/uL (ref 0.1–1.0)
Monocytes Relative: 16 %
Neutro Abs: 3.1 10*3/uL (ref 1.7–7.7)
Neutrophils Relative %: 51 %
Platelets: 315 10*3/uL (ref 150–400)
RBC: 4.58 MIL/uL (ref 3.87–5.11)
RDW: 16.8 % — ABNORMAL HIGH (ref 11.5–15.5)
WBC: 5.9 10*3/uL (ref 4.0–10.5)
nRBC: 0 % (ref 0.0–0.2)

## 2022-03-29 LAB — IRON AND TIBC
Iron: 74 ug/dL (ref 28–170)
Saturation Ratios: 26 % (ref 10.4–31.8)
TIBC: 285 ug/dL (ref 250–450)
UIBC: 211 ug/dL

## 2022-03-29 LAB — FERRITIN: Ferritin: 134 ng/mL (ref 11–307)

## 2022-03-29 NOTE — Progress Notes (Signed)
   Follow-Up Visit   Subjective  Cassidy Barber is a 71 y.o. female who presents for the following: Procedure (Here to treat right breast. Well differentiated). She is peeling and her skin is getting red. It does not hurt and she has no symptoms.    The following portions of the chart were reviewed this encounter and updated as appropriate:  Tobacco  Allergies  Meds  Problems  Med Hx  Surg Hx  Fam Hx      Objective  Well appearing patient in no apparent distress; mood and affect are within normal limits.  A full examination was performed including scalp, head, eyes, ears, nose, lips, neck, chest, axillae, abdomen, back, buttocks, bilateral upper extremities, bilateral lower extremities, hands, feet, fingers, toes, fingernails, and toenails. All findings within normal limits unless otherwise noted below.  Left Arm, Left Lower Leg - Anterior, Right Arm, Right Lower Leg - Anterior Erythema and peeling.           Right Breast Scaly pink plaque.    Assessment & Plan  Drug eruption Left Lower Leg - Anterior; Right Lower Leg - Anterior; Left Arm; Right Arm  Discontinue for 1 month.   Lipid Panel - Left Arm, Left Lower Leg - Anterior, Right Arm, Right Lower Leg - Anterior  Clobetasol Prop Emollient Base (CLOBETASOL PROPIONATE E) 0.05 % emollient cream - Left Arm, Left Lower Leg - Anterior, Right Arm, Right Lower Leg - Anterior Apply 1 application. topically 2 (two) times daily.  Squamous cell carcinoma of skin of chest Right Breast  Skin excision  Total excision diameter (cm):  7.4 Informed consent: discussed and consent obtained   Timeout: patient name, date of birth, surgical site, and procedure verified   Anesthesia: the lesion was anesthetized in a standard fashion   Anesthetic:  1% lidocaine w/ epinephrine 1-100,000 local infiltration Instrument used: #15 blade   Hemostasis achieved with: pressure and electrodesiccation   Outcome: patient tolerated procedure  well with no complications   Post-procedure details: sterile dressing applied and wound care instructions given   Dressing type: bandage, petrolatum and pressure dressing    Skin repair Complexity:  Simple Final length (cm):  7.4 Informed consent: discussed and consent obtained   Timeout: patient name, date of birth, surgical site, and procedure verified   Procedure prep:  Patient was prepped and draped in usual sterile fashion (non sterile) Prep type:  Chlorhexidine Anesthesia: the lesion was anesthetized in a standard fashion   Undermining: edges undermined   Fine/surface layer approximation (top stitches):  Suture size:  4-0 Suture type: Vicryl Rapide (coated polyglactin 910) and nylon   Suture type comment:  Nylon Stitches: simple running   Suture removal (days):  12 Hemostasis achieved with: suture, pressure and electrodesiccation Outcome: patient tolerated procedure well with no complications   Post-procedure details: wound care instructions given   Post-procedure details comment:  Non sterile pressure  Additional details:  4-0 vicryl x 6 4-0 Ethilon x running suture  Specimen 1 - Surgical pathology Differential Diagnosis: well Diff SCC Inferior margin Check Margins: Yes    I, Lavaris Sexson, PA-C, have reviewed all documentation's for this visit.  The documentation on 03/29/22 for the exam, diagnosis, procedures and orders are all accurate and complete.

## 2022-03-29 NOTE — Progress Notes (Signed)
   Follow-Up Visit   Subjective  Cassidy Barber is a 71 y.o. female who presents for the following: Follow-up (Patient here today for 1 month follow up. Per patient she has a lesion on er left upper chest x few weeks. Per patient she did pick it this morning. ).   The following portions of the chart were reviewed this encounter and updated as appropriate:  Tobacco  Allergies  Meds  Problems  Med Hx  Surg Hx  Fam Hx      Objective  Well appearing patient in no apparent distress; mood and affect are within normal limits.  A focused examination was performed including waist up and legs. Relevant physical exam findings are noted in the Assessment and Plan.  All scars clear. No atypical nevi.     Chest (Upper Torso, Anterior), Left Arm, Right Arm Scars clear. She has a lot of xerosis and peeling skin. Labs were all WNL's. No problems with the medication.    Assessment & Plan  Encounter for screening for malignant neoplasm of skin  Monthly visits.   Personal history of squamous cell carcinoma of skin (3) Chest (Upper Torso, Anterior); Left Arm; Right Arm  acitretin (SORIATANE) 25 MG capsule - Chest (Upper Torso, Anterior), Left Arm, Right Arm Take 1 capsule (25 mg total) by mouth daily.    I, Flynt Breeze, PA-C, have reviewed all documentation's for this visit.  The documentation on 03/29/22 for the exam, diagnosis, procedures and orders are all accurate and complete.

## 2022-04-04 DIAGNOSIS — Z944 Liver transplant status: Secondary | ICD-10-CM | POA: Diagnosis not present

## 2022-04-04 NOTE — Progress Notes (Signed)
Virtual Visit via Telephone Note [facility]  I connected with Cassidy Barber  on 04/05/22 at 2:20 PM by telephone and verified that I am speaking with the correct person using two identifiers.  Location: Patient: Home Provider: [facility]   I discussed the limitations, risks, security and privacy concerns of performing an evaluation and management service by telephone and the availability of in person appointments. I also discussed with the patient that there may be a patient responsible charge related to this service. The patient expressed understanding and agreed to proceed.  REASON FOR VISIT: Iron deficiency anemia  CURRENT THERAPY: Intermittent IV iron  INTERVAL HISTORY: Cassidy Barber is contacted today for follow-up of iron deficiency anemia.  She was last evaluated via telemedicine visit by Tarri Abernethy PA-C on 12/29/2021.    At today's visit, she reports feeling fair.  She continues to have fatigue with energy that "comes and goes," currently at about 40%.  She continues to have ongoing loose, dark bowel movements which have been chronic ever since her colectomy.  This is unchanged after her most recent EGD with APC of her GAVE on 02/14/2022.  No gross hematochezia, epistaxis, or other source of blood loss.  No pica, restless legs, chest pain, dyspnea on exertion, lightheadedness, or syncope.  She reports 40% energy and 100% appetite.  She is maintaining a stable weight at this time.    OBSERVATIONS/OBJECTIVE: Review of Systems  Constitutional:  Positive for malaise/fatigue. Negative for chills, diaphoresis, fever and weight loss.  Respiratory:  Negative for cough and shortness of breath.   Cardiovascular:  Negative for chest pain and palpitations.  Gastrointestinal:  Negative for abdominal pain, blood in stool, melena, nausea and vomiting.  Neurological:  Positive for dizziness. Negative for headaches.    PHYSICAL EXAM (per limitations of  virtual telephone visit): The patient is alert and oriented x 3, exhibiting adequate mentation, good mood, and ability to speak in full sentences and execute sound judgement.   ASSESSMENT & PLAN: 1.  Iron deficiency anemia from occult GI blood loss: - Unable to tolerate oral iron. - Secondary to history of colon cancer s/p abdominal colectomy.  Malabsorption. - EGD (12/20/2021): Gastric antral vascular ectasia without bleeding, treated with argon plasma coagulation with ablation of at least 80% of the lesions - Repeat EGD (02/14/2022): Gastric antral vascular ectasia (GAVE) with bleeding, treated with argon plasma coagulation (APC) - Most recent sigmoidoscopy (12/20/2021): Mucosal ulceration in distal rectum, end-to-end ileocolonic anastomosis characterized by healthy-appearing mucosa - SPEP negative in 2019 - She last received IV iron (Venofer 300 mg x 3) from 01/02/2022 through 01/19/2022 - Currently symptomatic with fatigue, which is chronic    - Dark stools ever since her colectomy.  Denies any epistaxis or rectal hemorrhage   - Most recent labs (03/29/2022): Hgb 12.3/MCV 91.0, ferritin 134, iron saturation 26% - PLAN: No indication for IV iron at this time. - Repeat labs and RTC in 3 months for follow-up  - PHONE VISIT - Continue follow-up with GI (Dr. Laural Golden) for management of any GI blood loss   2.  Liver Transplant: -Had this completed at [facility] in January 2015.  -She is on immunosuppression with everolimus.   3.  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 -She is followed by dermatology and has had several areas treated recently with topical creams.  4.  History of colon cancer: -Status post abdominal colectomy on 09/17/2011 with simultaneous total abdominal hysterectomy and bilateral salpingo-oophorectomy. -Found to have Lynch syndrome. -Per recommendations she is to  have an EGD every 2 to 3 years and annual colonoscopy for review of her residual colon tissue at the anastomosis. - Sigmoidoscopy (02/15/2021): Two nonbleeding erosions at ileocolonic anastomosis, small external hemorrhoids    I discussed the assessment and treatment plan with the patient. The patient was provided an opportunity to ask questions and all were answered. The patient agreed with the plan and demonstrated an understanding of the instructions.   The patient was advised to call back or seek an in-person evaluation if the symptoms worsen or if the condition fails to improve as anticipated.  I provided 12 minutes of non-face-to-face time during this encounter.   Harriett Rush, PA-C 04/05/2022 2:32 PM

## 2022-04-05 ENCOUNTER — Inpatient Hospital Stay (HOSPITAL_BASED_OUTPATIENT_CLINIC_OR_DEPARTMENT_OTHER): Payer: Medicare Other | Admitting: Physician Assistant

## 2022-04-05 DIAGNOSIS — D500 Iron deficiency anemia secondary to blood loss (chronic): Secondary | ICD-10-CM | POA: Diagnosis not present

## 2022-04-05 NOTE — Addendum Note (Signed)
Addended by: Candis Musa on: 04/05/2022 03:56 PM   Modules accepted: Orders

## 2022-04-10 ENCOUNTER — Other Ambulatory Visit: Payer: Self-pay | Admitting: Family

## 2022-04-10 DIAGNOSIS — F1320 Sedative, hypnotic or anxiolytic dependence, uncomplicated: Secondary | ICD-10-CM

## 2022-04-10 DIAGNOSIS — F411 Generalized anxiety disorder: Secondary | ICD-10-CM

## 2022-04-10 DIAGNOSIS — M5432 Sciatica, left side: Secondary | ICD-10-CM

## 2022-04-10 DIAGNOSIS — Z79899 Other long term (current) drug therapy: Secondary | ICD-10-CM

## 2022-04-11 DIAGNOSIS — C44329 Squamous cell carcinoma of skin of other parts of face: Secondary | ICD-10-CM | POA: Diagnosis not present

## 2022-04-18 DIAGNOSIS — Z944 Liver transplant status: Secondary | ICD-10-CM | POA: Diagnosis not present

## 2022-04-18 DIAGNOSIS — D849 Immunodeficiency, unspecified: Secondary | ICD-10-CM | POA: Diagnosis not present

## 2022-04-25 ENCOUNTER — Other Ambulatory Visit: Payer: Self-pay | Admitting: Family

## 2022-04-25 DIAGNOSIS — M5432 Sciatica, left side: Secondary | ICD-10-CM

## 2022-04-26 ENCOUNTER — Ambulatory Visit: Payer: Medicare Other | Admitting: *Deleted

## 2022-04-26 ENCOUNTER — Ambulatory Visit: Payer: Medicare Other

## 2022-04-26 DIAGNOSIS — Z85828 Personal history of other malignant neoplasm of skin: Secondary | ICD-10-CM

## 2022-04-26 MED ORDER — SILVER SULFADIAZINE 1 % EX CREA: 1.0000 | TOPICAL_CREAM | Freq: Every day | CUTANEOUS | 1 refills | Status: DC

## 2022-04-26 NOTE — Patient Instructions (Signed)
Lidocaine over the counter topical for the nose before surgery

## 2022-05-02 ENCOUNTER — Encounter (INDEPENDENT_AMBULATORY_CARE_PROVIDER_SITE_OTHER): Payer: Self-pay | Admitting: *Deleted

## 2022-05-02 DIAGNOSIS — Z944 Liver transplant status: Secondary | ICD-10-CM | POA: Diagnosis not present

## 2022-05-03 DIAGNOSIS — D485 Neoplasm of uncertain behavior of skin: Secondary | ICD-10-CM | POA: Diagnosis not present

## 2022-05-03 DIAGNOSIS — C44622 Squamous cell carcinoma of skin of right upper limb, including shoulder: Secondary | ICD-10-CM | POA: Diagnosis not present

## 2022-05-03 DIAGNOSIS — C44329 Squamous cell carcinoma of skin of other parts of face: Secondary | ICD-10-CM | POA: Diagnosis not present

## 2022-05-17 ENCOUNTER — Encounter: Payer: Medicare Other | Admitting: Physician Assistant

## 2022-05-21 ENCOUNTER — Encounter (INDEPENDENT_AMBULATORY_CARE_PROVIDER_SITE_OTHER): Payer: Self-pay

## 2022-05-21 ENCOUNTER — Ambulatory Visit (INDEPENDENT_AMBULATORY_CARE_PROVIDER_SITE_OTHER): Payer: Medicare Other | Admitting: Gastroenterology

## 2022-05-21 ENCOUNTER — Encounter (INDEPENDENT_AMBULATORY_CARE_PROVIDER_SITE_OTHER): Payer: Self-pay | Admitting: Gastroenterology

## 2022-05-21 ENCOUNTER — Other Ambulatory Visit (INDEPENDENT_AMBULATORY_CARE_PROVIDER_SITE_OTHER): Payer: Self-pay

## 2022-05-21 VITALS — BP 120/75 | HR 78 | Temp 98.1°F | Ht 64.0 in | Wt 145.7 lb

## 2022-05-21 DIAGNOSIS — Z1509 Genetic susceptibility to other malignant neoplasm: Secondary | ICD-10-CM

## 2022-05-21 DIAGNOSIS — Z944 Liver transplant status: Secondary | ICD-10-CM

## 2022-05-21 DIAGNOSIS — K31819 Angiodysplasia of stomach and duodenum without bleeding: Secondary | ICD-10-CM | POA: Diagnosis not present

## 2022-05-21 NOTE — Patient Instructions (Signed)
Schedule EGD Continue liver transplant follow up with Dr. Merrilee Jansky at [facility]

## 2022-05-21 NOTE — Progress Notes (Unsigned)
[name], M.D. Gastroenterology & Hepatology [facility] [address] [place], [place] [address]  Primary Care Physician: Junie Spencer, FNP [address] [place] [address]  I will communicate my assessment and recommendations to the referring MD via EMR.  Problems: GAVE PBC status post liver transplant  History of Present Illness: Cassidy Barber is a 71 y.o. female with past medical history of PBC status post liver transplant, Lynch syndrome s/p proctocolectomy, hypertension, hypothyroidism, history of colon cancer, multiple skin cancers, who presents for follow up of ***  The patient was last seen on 11/07/2021. At that time, the patient was evaluated for fatigue.  Had her CBC checked that showed mild anemia with hemoglobin of 11.7.EGD was performed on 12/20/2021 which showed presence of moderate GAVE which was ablated with APC.  There was a 10 mm polyp in the duodenal bulb which was biopsied and there was presence of erythema in the second portion of the duodenum.  Pathology was positive for a tubular adenoma.  Flexible sigmoidoscopy on the same day showed presence of healthy anastomosis at 20 cm from the anus.  There was ulcerated mucosa in the distal rectum with pathology reports consistent with ulceration and ulcers slough.  She was advised to have a repeat flexible sigmoidoscopy in 1 year.  The patient underwent repeat EGD on 02/14/2022 which showed presence of persistent GAVE which was ablated with APC.  A 12 mm polyp was removed via endoscopic mucosal resection with a hot snare.  Pathology was consistent with a tubular adenoma with focal erosion.  Patient was advised to have a repeat EGD in 3 months.  Most recent labs from 03/29/2022 showed a ferritin of 134, iron of 74, TIBC of 25, saturation of 26%, hemoglobin of 12.3.  She also underwent blood work-up on 03/23/2022 and had her CMP checked which showed an alkaline  phosphatase of 159, AST of 28, ALT of 14, total bilirubin 0.2, albumin 3.9.  Her everolimus level was 13.7.  Patient follows with Dr. Sharlene Motts at [facility] for follow up for her liver transplant. She had living donor donation from her son - she was transplanted 9 years ago at [facility]. She is due for repeat blood workup in 1 month. She is taking ursodiol 250 mg BID and also on everolimus.  She follows closely with demartology due to multiple skin cancers.  She has around 6 Bms per day without melena or hematochezia, with no change sin her consistency.  The patient denies having any nausea, vomiting, fever, chills, hematochezia, melena, hematemesis, abdominal distention, abdominal pain, diarrhea, jaundice, pruritus or weight loss.  Last EGD: Last Colonoscopy:  Past Medical History: Past Medical History:  Diagnosis Date   Abdominal wall hernia    Incarcerated status post surgical repair 2019 - [facility]   Anemia of chronic disease    Atypical nevus 01/21/2018   atypical neoplasm- Left scalp-ant (txpbx + MOHS), atypical neoplasm- Left scalp post- (txpbx + MOHS)   Basal cell carcinoma    Colon cancer (HCC)    Colon surgery 2005 and 2012   History of pulmonary hypertension    Pre liver transplant   Hypertension    Hypothyroidism    Lynch syndrome    Osteopenia    Primary biliary cirrhosis (HCC)    Status post liver transplantation - follows at [facility]   SCCA (squamous cell carcinoma) of skin 02/23/2020   Right Upper Chest(moderate) (MOH's)   SCCA (squamous cell carcinoma) of skin  04/07/2020   Left Top Leg (Keratoacanthoma) treatment after biopsy   SCCA (squamous cell carcinoma) of skin 04/07/2020   Left Foot Dorsal (in situ) treatment after biopsy   SCCA (squamous cell carcinoma) of skin 03/27/2021   Right Upper Back (Keratoacanthoma) (excision) (clear)   SCCA (squamous cell carcinoma) of skin 06/13/2021   Right Shoulder - anterior (moderately differentiated) (tx p bx)   SCCA (squamous cell carcinoma)  of skin 06/13/2021   Right Thigh - anterior (well differentiated) (tx p bx)   SCCA (squamous cell carcinoma) of skin 06/13/2021   Right Lower Leg - anterior (well differentiated) (tx p bx)   Squamous cell carcinoma of skin 04/22/2018   KA-Right mid chest (txpbx), KA-left elbow crease (txpbx), insitu-Right mid chest inf. (exc)   Squamous cell carcinoma of skin 05/20/2018   well diff-Left upper shin (txpbx), well diff-Right lower forearm (txpbx), well diff-Right upper shin (txpbx)   Squamous cell carcinoma of skin 06/11/2018   Scc + margin-Right mid chest inferior    Squamous cell carcinoma of skin 06/27/2018   well diff-Left mid thigh(txpbx), well diff-Left inner thigh (txpbx),insitu-Right cheek (txpbx),well diff-right inner heel (txpbx)   Squamous cell carcinoma of skin 09/16/2018   well diff-left shoulder (txpbx), well diff-Right chin (txpbx), well diff-right chest lateral (CX35FU)   Squamous cell carcinoma of skin 10/01/2018   Right outer lower shin (Txpbx)   Squamous cell carcinoma of skin 04/03/2019   well diff-Right center chest (MOHS), in situ-Right ear   Squamous cell carcinoma of skin 08/05/2019   in situ-Left calf (txpbx), in situ-left bicep (txpbx), well diff-Left chest,inf(txpbx), in situ-Right chest inf-(txpbx)   Squamous cell carcinoma of skin 11/19/2019   KA-left top leg (txpbx), modify-Riight forehead-(mohs), in situ-right hand (txpbx), in situ-Right forearm (txpbx), well diff-Right chest (txpbx), well diff-chin (txpbx)   Squamous cell carcinoma of skin 01/06/2020   KA- Left top leg   Squamous cell carcinoma of skin 05/12/2013   bowens-middle of chest (CX35FU)   Squamous cell carcinoma of skin 05/18/2015   well diff-Left upper arm (CX35FU + Exc),KA-right chest(txpbx), in situ-Left shin (txpbx), well diff-Right cheek (CX35FU), KA-Left post scalp (CX35FU)   Squamous cell carcinoma of skin 08/09/2015   KA-Left post scalp ((MOHS), in situ- mid chest (Txpbx +exc), in situ-Left  upper arm inferior (txpbx)   Squamous cell carcinoma of skin 10/13/2015   Left upper arm-clear   Squamous cell carcinoma of skin 03/09/2016   mod diff-mid chest (txpbx+ exc), mod diff-Right chest (txpbx+exc), well diff-right cheek-(txpbx),well diff-Left hand-(txpbx), in situ-Left upper arm (txpbx), well diff-Right cheek -(txpbx), well diff-Right crease arm (txpbx)    Squamous cell carcinoma of skin 05/24/2016   well diff-Right nasal crease-(MOHS)   Squamous cell carcinoma of skin 08/02/2016   KA-Left chest med (txpbx)   Squamous cell carcinoma of skin 08/30/2016   in situ-Left outer zygoma (txpbx)   Squamous cell carcinoma of skin 12/06/2016   well diff-Left chest sup, Left shoudler, insitu- right post scalp   Squamous cell carcinoma of skin 02/14/2017   well diff-Left forearm (EXC),in situ-RIght ant neck   Squamous cell carcinoma of skin 06/14/2017   in situ-Right forearm (txpbx), in situ-Right chest (txpbx), well diff-left chest (txpbx), well diff-anterior neck- (txpbx)   Squamous cell carcinoma of skin 08/07/2017   well diff-Left upper shoulder (txpbx), sup and invasive-Left temple (txpbx), well diff-Right upper shin (txpbx), in situ-Right clavicle (txpbx)   Squamous cell carcinoma of skin 10/17/2017   well diff-ant. neck (MOHS), in situ-Right chest, inf (  txpbx)   Squamous cell carcinoma of skin 01/21/2018   well diff- Right chest,ulnar (txpbx), well diff- right upper chest (txpbx), in situ-Right ant. crown (txpbx)   Squamous cell carcinoma of skin 08/02/2020   well diff-left lower leg-inferior (Txpbx)   Squamous cell carcinoma of skin 08/02/2020   well diff-right lower leg-mid (txpbx)   Squamous cell carcinoma of skin 08/02/2020   well diff-left chest upper   Squamous cell carcinoma of skin 08/02/2020   well diff-mid parietal scalp (MOHS)   Squamous cell carcinoma of skin 08/02/2020   well diff-right foot inner(txpbx)   Squamous cell carcinoma of skin 08/02/2020   well diff-  left lower leg medial (txpbx)   Squamous cell carcinoma of skin 08/02/2020   well diff-left lower leg anterior (txpbx)   Squamous cell carcinoma of skin 08/02/2020   well diff-left lower leg medial (txpbx)   Squamous cell carcinoma of skin 08/02/2020   well diff-right forearm-posterior (txpbx)    Past Surgical History: Past Surgical History:  Procedure Laterality Date   ABDOMINAL HERNIA REPAIR     Patient's states that she has had 8- 9 hernia surgeries   ABDOMINAL HYSTERECTOMY     BIOPSY  02/15/2021   Procedure: BIOPSY;  Surgeon: Malissa Hippo, MD;  [facility];  Service: Endoscopy;;   BIOPSY  12/20/2021   Procedure: BIOPSY;  Surgeon: Malissa Hippo, MD;  [facility];  Service: Endoscopy;;   CATARACT EXTRACTION W/PHACO Right 01/15/2020   Procedure: CATARACT EXTRACTION PHACO AND INTRAOCULAR LENS PLACEMENT (IOC);  Surgeon: Fabio Pierce, MD;  Location: [facility];  Service: Ophthalmology;  Laterality: Right;  CDE: 7.89   CATARACT EXTRACTION W/PHACO Left 01/29/2020   Procedure: CATARACT EXTRACTION PHACO AND INTRAOCULAR LENS PLACEMENT (IOC) (CDE: 6.33);  Surgeon: Fabio Pierce, MD;  Location: [facility];  Service: Ophthalmology;  Laterality: Left;   CHOLECYSTECTOMY  2007   COLON SURGERY  2008   Done at [facility]   COLONOSCOPY     Done at [facility]   ESOPHAGOGASTRODUODENOSCOPY N/A 08/21/2018   Procedure: ESOPHAGOGASTRODUODENOSCOPY (EGD);  Surgeon: Malissa Hippo, MD;  [facility];  Service: Endoscopy;  Laterality: N/A;   ESOPHAGOGASTRODUODENOSCOPY (EGD) WITH PROPOFOL N/A 12/16/2019   Procedure: ESOPHAGOGASTRODUODENOSCOPY (EGD) WITH PROPOFOL;  Surgeon: Malissa Hippo, MD;  [facility];  Service: Endoscopy;  Laterality: N/A;   ESOPHAGOGASTRODUODENOSCOPY (EGD) WITH PROPOFOL N/A 12/20/2021   Procedure: ESOPHAGOGASTRODUODENOSCOPY (EGD) WITH PROPOFOL;  Surgeon: Malissa Hippo, MD;  [facility];  Service: Endoscopy;  Laterality: N/A;  9:05    ESOPHAGOGASTRODUODENOSCOPY (EGD) WITH PROPOFOL N/A 02/14/2022   Procedure: ESOPHAGOGASTRODUODENOSCOPY (EGD) WITH PROPOFOL;  Surgeon: Malissa Hippo, MD;  [facility];  Service: Endoscopy;  Laterality: N/A;  730   EYE SURGERY     lasix   FLEXIBLE SIGMOIDOSCOPY N/A 10/20/2015   Procedure: FLEXIBLE SIGMOIDOSCOPY;  Surgeon: Malissa Hippo, MD;  [facility];  Service: Endoscopy;  Laterality: N/A;  41 - Dr Karilyn Cota has meeting until 1:00   FLEXIBLE SIGMOIDOSCOPY N/A 07/11/2016   Procedure: FLEXIBLE SIGMOIDOSCOPY;  Surgeon: Malissa Hippo, MD;  [facility];  Service: Endoscopy;  Laterality: N/A;  1200   FLEXIBLE SIGMOIDOSCOPY N/A 08/09/2017   Procedure: FLEXIBLE SIGMOIDOSCOPY;  Surgeon: Malissa Hippo, MD;  [facility];  Service: Endoscopy;  Laterality: N/A;  1:00   FLEXIBLE SIGMOIDOSCOPY N/A 08/21/2018   Procedure: FLEXIBLE SIGMOIDOSCOPY;  Surgeon: Malissa Hippo, MD;  [facility];  Service: Endoscopy;  Laterality: N/A;   FLEXIBLE SIGMOIDOSCOPY N/A 12/16/2019   Procedure: FLEXIBLE SIGMOIDOSCOPY wirh Propofol;  Surgeon: Malissa Hippo, MD;  [facility];  Service: Endoscopy;  Laterality: N/A;  7:30   FLEXIBLE SIGMOIDOSCOPY N/A 02/15/2021   Procedure: FLEXIBLE SIGMOIDOSCOPY WITH PROPOFOL;  Surgeon: Malissa Hippo, MD;  [facility];  Service: Endoscopy;  Laterality: N/A;  am   FLEXIBLE SIGMOIDOSCOPY N/A 12/20/2021   Procedure: FLEXIBLE SIGMOIDOSCOPY;  Surgeon: Malissa Hippo, MD;  [facility];  Service: Endoscopy;  Laterality: N/A;   FRACTURE SURGERY     right wrist metal plate   GI RADIOFREQUENCY ABLATION N/A 02/14/2022   Procedure: GI RADIOFREQUENCY ABLATION;  Surgeon: Malissa Hippo, MD;  [facility];  Service: Endoscopy;  Laterality: N/A;   HOT HEMOSTASIS N/A 12/20/2021   Procedure: HOT HEMOSTASIS (ARGON PLASMA COAGULATION/BICAP);  Surgeon: Malissa Hippo, MD;  [facility];  Service: Endoscopy;  Laterality: N/A;   LIVER TRANSPLANT  11/19/2013   POLYPECTOMY  08/09/2017   Procedure: POLYPECTOMY;  Surgeon: Malissa Hippo, MD;  [facility];  Service: Endoscopy;;  colon small bowel   POLYPECTOMY N/A 02/14/2022   Procedure: POLYPECTOMY;  Surgeon: Malissa Hippo, MD;  [facility];  Service: Endoscopy;  Laterality: N/A;   REVERSE SHOULDER ARTHROPLASTY Left 07/17/2018   Procedure: LEFT REVERSE SHOULDER ARTHROPLASTY;  Surgeon: Francena Hanly, MD;  Location: [facility];  Service: Orthopedics;  Laterality: Left;    REVERSE SHOULDER ARTHROPLASTY Right 07/20/2021   Procedure: REVERSE SHOULDER ARTHROPLASTY;  Surgeon: Francena Hanly, MD;  Location: [facility];  Service: Orthopedics;  Laterality: Right;   SHOULDER CLOSED REDUCTION Left 09/27/2019   Procedure: CLOSED REDUCTION SHOULDER;  Surgeon: Durene Romans, MD;  Location: [facility];  Service: Orthopedics;  Laterality: Left;   SPLENECTOMY  2006   TOTAL SHOULDER REVISION Left 11/12/2019   Procedure: Revision Left Reverse Shoulder Arthroplasty with poly exchange SDD;  Surgeon: Francena Hanly, MD;  Location: [facility];  Service: Orthopedics;  Laterality: Left;  -SDDC   TYMPANOSTOMY TUBE PLACEMENT     UPPER GASTROINTESTINAL ENDOSCOPY     Done at [facility]    Family History: Family History  Problem Relation Age of Onset   Prostate cancer Father    Colon cancer Father    Colon cancer Sister    Lung cancer Sister    Healthy Son    Alcohol abuse Brother    Allergic rhinitis Neg Hx    Asthma Neg Hx    Eczema Neg Hx    Urticaria Neg Hx     Social History: Social History   Tobacco Use  Smoking Status Never  Smokeless Tobacco Never   Social History   Substance and Sexual Activity  Alcohol Use No   Alcohol/week: 0.0 standard drinks of alcohol   Social History   Substance and Sexual Activity  Drug Use No    Allergies: Allergies  Allergen Reactions   Ciprofloxacin Itching   Codeine  Nausea Only    Medications: Current Outpatient Medications  Medication Sig Dispense Refill   acetaminophen (TYLENOL) 500 MG tablet Take 1,000 mg by mouth every 6 (six) hours as needed (for pain.).      acitretin (SORIATANE) 25 MG capsule Take 1 capsule (25 mg total) by mouth daily. 30 capsule 4   alendronate (FOSAMAX) 70 MG tablet TAKE 1 TABLET EVERY WEEK (Patient taking differently: Take 70 mg by mouth  every Thursday. TAKE 1 TABLET EVERY WEEK) 12 tablet 2   ALPRAZolam (XANAX) 0.5 MG tablet Take 1 tablet (0.5 mg total) by mouth at bedtime. 90 tablet 1   Ascorbic Acid (VITAMIN C) 500 MG CHEW Chew 1,000 mg by mouth daily.     Biotin 1000 MCG tablet Take 1,000 mcg by mouth 3 (three) times daily.     Biotin w/ Vitamins C & E (HAIR SKIN & NAILS GUMMIES PO) Take 2 tablets by mouth daily.     Calcium Citrate-Vitamin D (CALCIUM CITRATE + D3 PO) Take 1 tablet by mouth 2 (two) times daily.     cetirizine (ZYRTEC) 10 MG tablet Take 1 tablet (10 mg total) by mouth daily as needed for allergies. 90 tablet 3   clobetasol cream (TEMOVATE) 0.05 % Apply 1 application. topically 2 (two) times daily.     Clobetasol Prop Emollient Base (CLOBETASOL PROPIONATE E) 0.05 % emollient cream Apply 1 application. topically 2 (two) times daily. 180 g 1   clotrimazole-betamethasone (LOTRISONE) cream Apply 1 application topically 2 (two) times daily. (Patient taking differently: Apply 1 application  topically daily as needed (Rash).) 30 g 0   Cyanocobalamin (VITAMIN B-12) 5000 MCG TBDP Take 5,000 mcg by mouth 2 (two) times a week.     cyclobenzaprine (FLEXERIL) 10 MG tablet Take 1 tablet (10 mg total) by mouth 3 (three) times daily as needed for muscle spasms. 30 tablet 1   famotidine (PEPCID) 20 MG tablet Take 1 tablet (20 mg total) by mouth 2 (two) times daily. 60 tablet 5   fluorouracil (EFUDEX) 5 % cream Apply 1 application topically daily as needed (cancer spots).   0   gabapentin (NEURONTIN) 100 MG capsule TAKE ONE  CAPSULE BY MOUTH DAILY 90 capsule 0   levothyroxine (SYNTHROID) 112 MCG tablet Take 1 tablet (112 mcg total) by mouth daily. 90 tablet 4   losartan (COZAAR) 25 MG tablet Take 1 tablet (25 mg total) by mouth daily. 90 tablet 4   metoprolol succinate (TOPROL XL) 25 MG 24 hr tablet Take 1 tablet (25 mg total) by mouth daily. 90 tablet 4   mupirocin ointment (BACTROBAN) 2 % Apply 1 application topically daily as needed (Cancer spots). 22 g 3   NIACINAMIDE-ZINC-FOLIC ACID PO Take 2 tablets by mouth 2 (two) times daily.     pantoprazole (PROTONIX) 40 MG tablet Take 1 tablet (40 mg total) by mouth every morning. 90 tablet 3   silver sulfADIAZINE (SILVADENE) 1 % cream Apply 1 Application topically daily. Apply to large surface area once to twice daily 400 g 1   ursodiol (ACTIGALL) 250 MG tablet Take 250 mg by mouth 2 (two) times daily.      Vitamin D, Ergocalciferol, (DRISDOL) 1.25 MG (50000 UNIT) CAPS capsule TAKE 1 CAPSULE BY MOUTH ONCE A WEEK 12 capsule 3   vitamin E 180 MG (400 UNITS) capsule Take 400 Units by mouth daily.     ZORTRESS 0.5 MG TABS Take 4 tablets (2 mg total) by mouth 2 (two) times daily. 180 tablet 2   No current facility-administered medications for this visit.    Review of Systems: GENERAL: negative for malaise, night sweats HEENT: No changes in hearing or vision, no nose bleeds or other nasal problems. NECK: Negative for lumps, goiter, pain and significant neck swelling RESPIRATORY: Negative for cough, wheezing CARDIOVASCULAR: Negative for chest pain, leg swelling, palpitations, orthopnea GI: SEE HPI MUSCULOSKELETAL: Negative for joint pain or swelling, back pain, and muscle pain. SKIN:  Negative for lesions, rash PSYCH: Negative for sleep disturbance, mood disorder and recent psychosocial stressors. HEMATOLOGY Negative for prolonged bleeding, bruising easily, and swollen nodes. ENDOCRINE: Negative for cold or heat intolerance, polyuria, polydipsia and goiter. NEURO:  negative for tremor, gait imbalance, syncope and seizures. The remainder of the review of systems is noncontributory.   Physical Exam: BP 120/75 (BP Location: Left Arm, Patient Position: Sitting, Cuff Size: Small)   Pulse 78   Temp 98.1 F (36.7 C) (Oral)   Ht 5\' 4"  (1.626 m)   Wt 145 lb 11.2 oz (66.1 kg)   BMI 25.01 kg/m  GENERAL: The patient is AO x3, in no acute distress. HEENT: Head is normocephalic and atraumatic. EOMI are intact. Mouth is well hydrated and without lesions. NECK: Supple. No masses LUNGS: Clear to auscultation. No presence of rhonchi/wheezing/rales. Adequate chest expansion HEART: RRR, normal s1 and s2. ABDOMEN: Soft, nontender, no guarding, no peritoneal signs, and nondistended. BS +. No masses. RECTAL EXAM: no external lesions, normal tone, no masses, brown stool without blood.*** Chaperone: EXTREMITIES: Without any cyanosis, clubbing, rash, lesions or edema. NEUROLOGIC: AOx3, no focal motor deficit. SKIN: no jaundice, no rashes  Imaging/Labs: as above  I personally reviewed and interpreted the available labs, imaging and endoscopic files.  Impression and Plan: KENNDRA COMMINS is a 71 y.o. female coming for follow up of ***   All questions were answered.      Dolores Frame, MD Gastroenterology and Hepatology [facility]

## 2022-05-21 NOTE — H&P (View-Only) (Signed)
Cassidy Barber, M.D. Gastroenterology & Hepatology [facility] [address] [place], [place] [address]  Primary Care Physician: [name], [place] [address]  I will communicate my assessment and recommendations to the referring MD via EMR.  Problems: GAVE PBC status post liver transplant  History of Present Illness: Cassidy Barber is a 71 y.o. female with past medical history of PBC status post liver transplant, GAVE complicated by iron deficiency anemia, Lynch syndrome s/p proctocolectomy, hypertension, hypothyroidism, history of colon cancer, multiple skin cancers, who presents for follow up of GAVE and PBC.  The patient was last seen on 11/07/2021. At that time, the patient was evaluated for fatigue.  Had her CBC checked that showed mild anemia with hemoglobin of 11.7.EGD was performed on 12/20/2021 which showed presence of moderate GAVE which was ablated with APC.  There was a 10 mm polyp in the duodenal bulb which was biopsied and there was presence of erythema in the second portion of the duodenum.  Pathology was positive for a tubular adenoma.  Flexible sigmoidoscopy on the same day showed presence of healthy anastomosis at 20 cm from the anus.  There was ulcerated mucosa in the distal rectum with pathology reports consistent with ulceration and ulcers slough.  She was advised to have a repeat flexible sigmoidoscopy in 1 year.  The patient underwent repeat EGD on 02/14/2022 for management of iron deficiency anemia which showed presence of persistent GAVE which was ablated with APC.  A 12 mm polyp was removed via endoscopic mucosal resection with a hot snare.  Pathology was consistent with a tubular adenoma with focal erosion.  Patient was advised to have a repeat EGD in 3 months.  Most recent labs from 03/29/2022 showed a ferritin of 134, iron of 74, TIBC of 25, saturation of 26%, hemoglobin of 12.3.  She  also underwent blood work-up on 03/23/2022 and had her CMP checked which showed an alkaline phosphatase of 159, AST of 28, ALT of 14, total bilirubin 0.2, albumin 3.9.  Her everolimus level was 13.7.  Patient follows with Dr. Merrilee Jansky at [facility] for follow up for her liver transplant. She had living donor donation from her son - she was transplanted 9 years ago at [facility]. She is due for repeat blood workup in 1 month. She is taking ursodiol 250 mg BID and also on everolimus.  She follows closely with demartology due to multiple skin cancers.  She has around 6 Bms per day without melena or hematochezia, with no changes in consistency.  The patient denies having any nausea, vomiting, fever, chills, hematochezia, melena, hematemesis, abdominal distention, abdominal pain, jaundice, pruritus or weight loss.  Last EGD: As above Last flexible sigmoidoscopy: As above, recommend a repeat in 1 year  Past Medical History: Past Medical History:  Diagnosis Date   Abdominal wall hernia    Incarcerated status post surgical repair 2019 - [facility]   Anemia of chronic disease    Atypical nevus 01/21/2018   atypical neoplasm- Left scalp-ant (txpbx + MOHS), atypical neoplasm- Left scalp post- (txpbx + MOHS)   Basal cell carcinoma    Colon cancer ([place])    Colon surgery 2005 and 2012   History of pulmonary hypertension    Pre liver transplant   Hypertension    Hypothyroidism    Lynch syndrome    Osteopenia    Primary biliary cirrhosis (HCC)    Status post liver transplantation - follows at [facility]  SCCA (squamous cell carcinoma) of skin 02/23/2020   Right Upper Chest(moderate) (MOH's)   SCCA (squamous cell carcinoma) of skin 04/07/2020   Left Top Leg (Keratoacanthoma) treatment after biopsy   SCCA (squamous cell carcinoma) of skin 04/07/2020   Left Foot Dorsal (in situ) treatment after biopsy   SCCA (squamous cell carcinoma) of skin 03/27/2021   Right Upper Back (Keratoacanthoma) (excision) (clear)   SCCA (squamous  cell carcinoma) of skin 06/13/2021   Right Shoulder - anterior (moderately differentiated) (tx p bx)   SCCA (squamous cell carcinoma) of skin 06/13/2021   Right Thigh - anterior (well differentiated) (tx p bx)   SCCA (squamous cell carcinoma) of skin 06/13/2021   Right Lower Leg - anterior (well differentiated) (tx p bx)   Squamous cell carcinoma of skin 04/22/2018   KA-Right mid chest (txpbx), KA-left elbow crease (txpbx), insitu-Right mid chest inf. (exc)   Squamous cell carcinoma of skin 05/20/2018   well diff-Left upper shin (txpbx), well diff-Right lower forearm (txpbx), well diff-Right upper shin (txpbx)   Squamous cell carcinoma of skin 06/11/2018   Scc + margin-Right mid chest inferior    Squamous cell carcinoma of skin 06/27/2018   well diff-Left mid thigh(txpbx), well diff-Left inner thigh (txpbx),insitu-Right cheek (txpbx),well diff-right inner heel (txpbx)   Squamous cell carcinoma of skin 09/16/2018   well diff-left shoulder (txpbx), well diff-Right chin (txpbx), well diff-right chest lateral (CX35FU)   Squamous cell carcinoma of skin 10/01/2018   Right outer lower shin (Txpbx)   Squamous cell carcinoma of skin 04/03/2019   well diff-Right center chest (MOHS), in situ-Right ear   Squamous cell carcinoma of skin 08/05/2019   in situ-Left calf (txpbx), in situ-left bicep (txpbx), well diff-Left chest,inf(txpbx), in situ-Right chest inf-(txpbx)   Squamous cell carcinoma of skin 11/19/2019   KA-left top leg (txpbx), modify-Riight forehead-(mohs), in situ-right hand (txpbx), in situ-Right forearm (txpbx), well diff-Right chest (txpbx), well diff-chin (txpbx)   Squamous cell carcinoma of skin 01/06/2020   KA- Left top leg   Squamous cell carcinoma of skin 05/12/2013   bowens-middle of chest (CX35FU)   Squamous cell carcinoma of skin 05/18/2015   well diff-Left upper arm (CX35FU + Exc),KA-right chest(txpbx), in situ-Left shin (txpbx), well diff-Right cheek (CX35FU), KA-Left post  scalp (CX35FU)   Squamous cell carcinoma of skin 08/09/2015   KA-Left post scalp ((MOHS), in situ- mid chest (Txpbx +exc), in situ-Left upper arm inferior (txpbx)   Squamous cell carcinoma of skin 10/13/2015   Left upper arm-clear   Squamous cell carcinoma of skin 03/09/2016   mod diff-mid chest (txpbx+ exc), mod diff-Right chest (txpbx+exc), well diff-right cheek-(txpbx),well diff-Left hand-(txpbx), in situ-Left upper arm (txpbx), well diff-Right cheek -(txpbx), well diff-Right crease arm (txpbx)    Squamous cell carcinoma of skin 05/24/2016   well diff-Right nasal crease-(MOHS)   Squamous cell carcinoma of skin 08/02/2016   KA-Left chest med (txpbx)   Squamous cell carcinoma of skin 08/30/2016   in situ-Left outer zygoma (txpbx)   Squamous cell carcinoma of skin 12/06/2016   well diff-Left chest sup, Left shoudler, insitu- right post scalp   Squamous cell carcinoma of skin 02/14/2017   well diff-Left forearm (EXC),in situ-RIght ant neck   Squamous cell carcinoma of skin 06/14/2017   in situ-Right forearm (txpbx), in situ-Right chest (txpbx), well diff-left chest (txpbx), well diff-anterior neck- (txpbx)   Squamous cell carcinoma of skin 08/07/2017   well diff-Left upper shoulder (txpbx), sup and invasive-Left temple (txpbx), well diff-Right upper shin (txpbx), in  situ-Right clavicle (txpbx)   Squamous cell carcinoma of skin 10/17/2017   well diff-ant. neck (MOHS), in situ-Right chest, inf (txpbx)   Squamous cell carcinoma of skin 01/21/2018   well diff- Right chest,ulnar (txpbx), well diff- right upper chest (txpbx), in situ-Right ant. crown (txpbx)   Squamous cell carcinoma of skin 08/02/2020   well diff-left lower leg-inferior (Txpbx)   Squamous cell carcinoma of skin 08/02/2020   well diff-right lower leg-mid (txpbx)   Squamous cell carcinoma of skin 08/02/2020   well diff-left chest upper   Squamous cell carcinoma of skin 08/02/2020   well diff-mid parietal scalp (MOHS)    Squamous cell carcinoma of skin 08/02/2020   well diff-right foot inner(txpbx)   Squamous cell carcinoma of skin 08/02/2020   well diff- left lower leg medial (txpbx)   Squamous cell carcinoma of skin 08/02/2020   well diff-left lower leg anterior (txpbx)   Squamous cell carcinoma of skin 08/02/2020   well diff-left lower leg medial (txpbx)   Squamous cell carcinoma of skin 08/02/2020   well diff-right forearm-posterior (txpbx)    Past Surgical History: Past Surgical History:  Procedure Laterality Date   ABDOMINAL HERNIA REPAIR     Patient's states that she has had 8- 9 hernia surgeries   ABDOMINAL HYSTERECTOMY     BIOPSY  02/15/2021   Procedure: BIOPSY;  Surgeon: Rogene Houston, MD;  [facility];  Service: Endoscopy;;   BIOPSY  12/20/2021   Procedure: BIOPSY;  Surgeon: Rogene Houston, MD;  [facility];  Service: Endoscopy;;   CATARACT EXTRACTION W/PHACO Right 01/15/2020   Procedure: CATARACT EXTRACTION PHACO AND INTRAOCULAR LENS PLACEMENT ([place]);  Surgeon: Baruch Goldmann, MD;  [facility]: [facility];  Service: Ophthalmology;  Laterality: Right;  CDE: 7.89   CATARACT EXTRACTION W/PHACO Left 01/29/2020   Procedure: CATARACT EXTRACTION PHACO AND INTRAOCULAR LENS PLACEMENT (IOC) (CDE: 6.33);  Surgeon: Baruch Goldmann, MD;  Location: [facility];  Service: Ophthalmology;  Laterality: Left;   CHOLECYSTECTOMY  2007   COLON SURGERY  2008   [facility] at [facility]   COLONOSCOPY     [facility] at [facility]   ESOPHAGOGASTRODUODENOSCOPY N/A 08/21/2018   Procedure: ESOPHAGOGASTRODUODENOSCOPY (EGD);  Surgeon: Rogene Houston, MD;  [facility];  Service: Endoscopy;  Laterality: N/A;   ESOPHAGOGASTRODUODENOSCOPY (EGD) WITH PROPOFOL N/A 12/16/2019   Procedure: ESOPHAGOGASTRODUODENOSCOPY (EGD) WITH PROPOFOL;  Surgeon: Rogene Houston, MD;  [facility];  Service: Endoscopy;  Laterality: N/A;   ESOPHAGOGASTRODUODENOSCOPY (EGD) WITH PROPOFOL N/A 12/20/2021   Procedure:  ESOPHAGOGASTRODUODENOSCOPY (EGD) WITH PROPOFOL;  Surgeon: Rogene Houston, MD;  [facility];  Service: Endoscopy;  Laterality: N/A;  9:05   ESOPHAGOGASTRODUODENOSCOPY (EGD) WITH PROPOFOL N/A 02/14/2022   Procedure: ESOPHAGOGASTRODUODENOSCOPY (EGD) WITH PROPOFOL;  Surgeon: Rogene Houston, MD;  [facility];  Service: Endoscopy;  Laterality: N/A;  730   EYE SURGERY     lasix   FLEXIBLE SIGMOIDOSCOPY N/A 10/20/2015   Procedure: FLEXIBLE SIGMOIDOSCOPY;  Surgeon: Rogene Houston, MD;  [facility];  Service: Endoscopy;  Laterality: N/A;  2 - Dr Laural Golden has meeting until 1:00   FLEXIBLE SIGMOIDOSCOPY N/A 07/11/2016   Procedure: FLEXIBLE SIGMOIDOSCOPY;  Surgeon: Rogene Houston, MD;  [facility];  Service: Endoscopy;  Laterality: N/A;  [place] N/A 08/09/2017   Procedure: FLEXIBLE SIGMOIDOSCOPY;  Surgeon: Rogene Houston, MD;  [facility];  Service: Endoscopy;  Laterality: N/A;  1:00  FLEXIBLE SIGMOIDOSCOPY N/A 08/21/2018   Procedure: FLEXIBLE SIGMOIDOSCOPY;  Surgeon: Rogene Houston, MD;  [facility];  Service: Endoscopy;  Laterality: N/A;   FLEXIBLE SIGMOIDOSCOPY N/A 12/16/2019   Procedure: FLEXIBLE SIGMOIDOSCOPY wirh Propofol;  Surgeon: Rogene Houston, MD;  [facility];  Service: Endoscopy;  Laterality: N/A;  7:30   FLEXIBLE SIGMOIDOSCOPY N/A 02/15/2021   Procedure: FLEXIBLE SIGMOIDOSCOPY WITH PROPOFOL;  Surgeon: Rogene Houston, MD;  [facility];  Service: Endoscopy;  Laterality: N/A;  am   FLEXIBLE SIGMOIDOSCOPY N/A 12/20/2021   Procedure: FLEXIBLE SIGMOIDOSCOPY;  Surgeon: Rogene Houston, MD;  [facility];  Service: Endoscopy;  Laterality: N/A;   FRACTURE SURGERY     right wrist metal plate   GI RADIOFREQUENCY ABLATION N/A 02/14/2022   Procedure: GI RADIOFREQUENCY ABLATION;  Surgeon: Rogene Houston, MD;  [facility];  Service: Endoscopy;  Laterality:  N/A;   HOT HEMOSTASIS N/A 12/20/2021   Procedure: HOT HEMOSTASIS (ARGON PLASMA COAGULATION/BICAP);  Surgeon: Rogene Houston, MD;  [facility];  Service: Endoscopy;  Laterality: N/A;   LIVER TRANSPLANT  11/19/2013   POLYPECTOMY  08/09/2017   Procedure: POLYPECTOMY;  Surgeon: Rogene Houston, MD;  [facility];  Service: Endoscopy;;  colon small bowel   POLYPECTOMY N/A 02/14/2022   Procedure: POLYPECTOMY;  Surgeon: Rogene Houston, MD;  [facility];  Service: Endoscopy;  Laterality: N/A;   REVERSE SHOULDER ARTHROPLASTY Left 07/17/2018   Procedure: LEFT REVERSE SHOULDER ARTHROPLASTY;  Surgeon: Justice Britain, MD;  Location: [place];  Service: Orthopedics;  Laterality: Left;  153mn   REVERSE SHOULDER ARTHROPLASTY Right 07/20/2021   Procedure: REVERSE SHOULDER ARTHROPLASTY;  Surgeon: SJustice Britain MD;  Location: [facility];  Service: Orthopedics;  Laterality: Right;   SHOULDER CLOSED REDUCTION Left 09/27/2019   Procedure: CLOSED REDUCTION SHOULDER;  Surgeon: OParalee Cancel MD;  Location: [facility];  Service: Orthopedics;  Laterality: Left;   SPLENECTOMY  2006   TOTAL SHOULDER REVISION Left 11/12/2019   Procedure: Revision Left Reverse Shoulder Arthroplasty with poly exchange SDD;  Surgeon: SJustice Britain MD;  Location: [facility];  Service: Orthopedics;  Laterality: Left;  123m -SDDC   TYMPANOSTOMY TUBE PLACEMENT     UPPER GASTROINTESTINAL ENDOSCOPY     [facility]  Family History: Family History  Problem Relation Age of Onset   Prostate cancer Father    Colon cancer Father    Colon cancer Sister    Lung cancer Sister    Healthy Son    Alcohol abuse Brother    Allergic rhinitis Neg Hx    Asthma Neg Hx    Eczema Neg Hx    Urticaria Neg Hx     Social History: Social History   Tobacco Use  Smoking Status Never  Smokeless Tobacco Never   Social History   Substance and Sexual Activity  Alcohol Use No   Alcohol/week: 0.0 standard drinks of alcohol    Social History   Substance and Sexual Activity  Drug Use No    Allergies: Allergies  Allergen Reactions   Ciprofloxacin Itching   Codeine Nausea Only    Medications: Current Outpatient Medications  Medication Sig Dispense Refill   acetaminophen (TYLENOL) 500 MG tablet Take 1,000 mg by mouth every 6 (six) hours as needed (for pain.).      acitretin (SORIATANE) 25 MG capsule Take 1 capsule (25 mg total) by mouth daily. 30 capsule 4  alendronate (FOSAMAX) 70 MG tablet TAKE 1 TABLET EVERY WEEK (Patient taking differently: Take 70 mg by mouth every Thursday. TAKE 1 TABLET EVERY WEEK) 12 tablet 2   ALPRAZolam (XANAX) 0.5 MG tablet Take 1 tablet (0.5 mg total) by mouth at bedtime. 90 tablet 1   Ascorbic Acid (VITAMIN C) 500 MG CHEW Chew 1,000 mg by mouth daily.     Biotin 1000 MCG tablet Take 1,000 mcg by mouth 3 (three) times daily.     Biotin w/ Vitamins C & E (HAIR SKIN & NAILS GUMMIES PO) Take 2 tablets by mouth daily.     Calcium Citrate-Vitamin D (CALCIUM CITRATE + D3 PO) Take 1 tablet by mouth 2 (two) times daily.     cetirizine (ZYRTEC) 10 MG tablet Take 1 tablet (10 mg total) by mouth daily as needed for allergies. 90 tablet 3   clobetasol cream (TEMOVATE) 1.91 % Apply 1 application. topically 2 (two) times daily.     Clobetasol Prop Emollient Base (CLOBETASOL PROPIONATE E) 0.05 % emollient cream Apply 1 application. topically 2 (two) times daily. 180 g 1   clotrimazole-betamethasone (LOTRISONE) cream Apply 1 application topically 2 (two) times daily. (Patient taking differently: Apply 1 application  topically daily as needed (Rash).) 30 g 0   Cyanocobalamin (VITAMIN B-12) 5000 MCG TBDP Take 5,000 mcg by mouth 2 (two) times a week.     cyclobenzaprine (FLEXERIL) 10 MG tablet Take 1 tablet (10 mg total) by mouth 3 (three) times daily as needed for muscle spasms. 30 tablet 1   famotidine (PEPCID) 20 MG tablet Take 1 tablet (20 mg total) by mouth 2 (two) times daily. 60 tablet 5    fluorouracil (EFUDEX) 5 % cream Apply 1 application topically daily as needed (cancer spots).   0   gabapentin (NEURONTIN) 100 MG capsule TAKE ONE CAPSULE BY MOUTH DAILY 90 capsule 0   levothyroxine (SYNTHROID) 112 MCG tablet Take 1 tablet (112 mcg total) by mouth daily. 90 tablet 4   losartan (COZAAR) 25 MG tablet Take 1 tablet (25 mg total) by mouth daily. 90 tablet 4   metoprolol succinate (TOPROL XL) 25 MG 24 hr tablet Take 1 tablet (25 mg total) by mouth daily. 90 tablet 4   mupirocin ointment (BACTROBAN) 2 % Apply 1 application topically daily as needed (Cancer spots). 22 g 3   NIACINAMIDE-ZINC-FOLIC ACID PO Take 2 tablets by mouth 2 (two) times daily.     pantoprazole (PROTONIX) 40 MG tablet Take 1 tablet (40 mg total) by mouth every morning. 90 tablet 3   silver sulfADIAZINE (SILVADENE) 1 % cream Apply 1 Application topically daily. Apply to large surface area once to twice daily 400 g 1   ursodiol (ACTIGALL) 250 MG tablet Take 250 mg by mouth 2 (two) times daily.      Vitamin D, Ergocalciferol, (DRISDOL) 1.25 MG (50000 UNIT) CAPS capsule TAKE 1 CAPSULE BY MOUTH ONCE A WEEK 12 capsule 3   vitamin E 180 MG (400 UNITS) capsule Take 400 Units by mouth daily.     ZORTRESS 0.5 MG TABS Take 4 tablets (2 mg total) by mouth 2 (two) times daily. 180 tablet 2   No current facility-administered medications for this visit.    Review of Systems: GENERAL: negative for malaise, night sweats HEENT: No changes in hearing or vision, no nose bleeds or other nasal problems. NECK: Negative for lumps, goiter, pain and significant neck swelling RESPIRATORY: Negative for cough, wheezing CARDIOVASCULAR: Negative for chest pain, leg swelling,  palpitations, orthopnea GI: SEE HPI MUSCULOSKELETAL: Negative for joint pain or swelling, back pain, and muscle pain. SKIN: Negative for lesions, rash PSYCH: Negative for sleep disturbance, mood disorder and recent psychosocial stressors. HEMATOLOGY Negative for  prolonged bleeding, bruising easily, and swollen nodes. ENDOCRINE: Negative for cold or heat intolerance, polyuria, polydipsia and goiter. NEURO: negative for tremor, gait imbalance, syncope and seizures. The remainder of the review of systems is noncontributory.   Physical Exam: BP 120/75 (BP Location: Left Arm, Patient Position: Sitting, Cuff Size: Small)   Pulse 78   Temp 98.1 F (36.7 C) (Oral)   Ht '5\' 4"'$  (1.626 m)   Wt 145 lb 11.2 oz (66.1 kg)   BMI 25.01 kg/m  GENERAL: The patient is AO x3, in no acute distress. HEENT: Head is normocephalic and atraumatic. EOMI are intact. Mouth is well hydrated and without lesions. NECK: Supple. No masses LUNGS: Clear to auscultation. No presence of rhonchi/wheezing/rales. Adequate chest expansion HEART: RRR, normal s1 and s2. ABDOMEN: Soft, nontender, no guarding, no peritoneal signs, and nondistended. BS +. No masses. EXTREMITIES: Without any cyanosis, clubbing, rash, lesions or edema. NEUROLOGIC: AOx3, no focal motor deficit. SKIN: no jaundice, no rashes  Imaging/Labs: as above  I personally reviewed and interpreted the available labs, imaging and endoscopic files.  Impression and Plan: Cassidy Barber is a 71 y.o. female with past medical history of PBC status post liver transplant, GAVE complicated by iron deficiency anemia, Lynch syndrome s/p proctocolectomy, hypertension, hypothyroidism, history of colon cancer, multiple skin cancers, who presents for follow up of GAVE and PBC.  The patient had presence of anemia and GAVE in the past which has required endoscopic management with improvement of her anemia and iron stores.  It is very likely GAVE has much more improved but we will repeat an EGD as recommended by Dr. Laural Golden the past to determine if there has been complete resolution of GAVE.  Regarding her liver transplant, she has followed at [facility] and has presented adequate function of her graft was taken.  Current immunosuppressive  treatment.  She should follow at [place] with Dr. Lavone Neri regarding further adjustments of her medication.  Finally, we discussed her Lynch syndrome further.  She needs to have a repeat sigmoidoscopy a year from her last procedure and she should follow-up with her dermatologist closely.  - Schedule EGD - Continue liver transplant follow up with Dr. Merrilee Jansky at [facility]  All questions were answered.      Harvel Quale, MD Gastroenterology and Hepatology [facility]

## 2022-05-23 DIAGNOSIS — C44391 Other specified malignant neoplasm of skin of nose: Secondary | ICD-10-CM | POA: Diagnosis not present

## 2022-05-28 ENCOUNTER — Ambulatory Visit: Payer: Medicare Other | Admitting: Family

## 2022-05-29 NOTE — Patient Instructions (Signed)
Cassidy Barber  05/29/2022     '@PREFPERIOPPHARMACY'$ @   Your procedure is scheduled on  06/04/2022.   Report to [name] at  1130  A.M.   Call this number if you have problems the morning of surgery:  [phone]   Remember:  Follow the diet instructions given to you by the office.     Take these medicines the morning of surgery with A SIP OF WATER          soriatane, zyrtec, flexeril(if needed), pepcid, metoprolol, protonix, actigall, zortress.     Do not wear jewelry, make-up or nail polish.  Do not wear lotions, powders, or perfumes, or deodorant.  Do not shave 48 hours prior to surgery.  Men may shave face and neck.  Do not bring valuables to the hospital.  [facility] is not responsible for any belongings or valuables.  Contacts, dentures or bridgework may not be worn into surgery.  Leave your suitcase in the car.  After surgery it may be brought to your room.  For patients admitted to the hospital, discharge time will be determined by your treatment team.  Patients discharged the day of surgery will not be allowed to drive home and must have someone with them for 24 hours.    Special instructions:   DO NOT smoke tobacco or vape for 24 hours before your procedure.  Please read over the following fact sheets that you were given. Anesthesia Post-op Instructions and Care and Recovery After Surgery      Upper Endoscopy, Adult, Care After This sheet gives you information about how to care for yourself after your procedure. Your health care provider may also give you more specific instructions. If you have problems or questions, contact your health care provider. What can I expect after the procedure? After the procedure, it is common to have: A sore throat. Mild stomach pain or discomfort. Bloating. Nausea. Follow these instructions at home:  Follow instructions from your health care provider about what to eat or drink after your procedure. Return to  your normal activities as told by your health care provider. Ask your health care provider what activities are safe for you. Take over-the-counter and prescription medicines only as told by your health care provider. If you were given a sedative during the procedure, it can affect you for several hours. Do not drive or operate machinery until your health care provider says that it is safe. Keep all follow-up visits as told by your health care provider. This is important. Contact a health care provider if you have: A sore throat that lasts longer than one day. Trouble swallowing. Get help right away if: You vomit blood or your vomit looks like coffee grounds. You have: A fever. Bloody, black, or tarry stools. A severe sore throat or you cannot swallow. Difficulty breathing. Severe pain in your chest or abdomen. Summary After the procedure, it is common to have a sore throat, mild stomach discomfort, bloating, and nausea. If you were given a sedative during the procedure, it can affect you for several hours. Do not drive or operate machinery until your health care provider says that it is safe. Follow instructions from your health care provider about what to eat or drink after your procedure. Return to your normal activities as told by your health care provider. This information is not intended to replace advice given to you by your health care provider. Make sure you discuss any questions you  have with your health care provider. Document Revised: 09/04/2019 Document Reviewed: 03/31/2018 Elsevier Patient Education  [place] After This sheet gives you information about how to care for yourself after your procedure. Your health care provider may also give you more specific instructions. If you have problems or questions, contact your health care provider. What can I expect after the procedure? After the procedure, it is common to  have: Tiredness. Forgetfulness about what happened after the procedure. Impaired judgment for important decisions. Nausea or vomiting. Some difficulty with balance. Follow these instructions at home: For the time period you were told by your health care provider:     Rest as needed. Do not participate in activities where you could fall or become injured. Do not drive or use machinery. Do not drink alcohol. Do not take sleeping pills or medicines that cause drowsiness. Do not make important decisions or sign legal documents. Do not take care of children on your own. Eating and drinking Follow the diet that is recommended by your health care provider. Drink enough fluid to keep your urine pale yellow. If you vomit: Drink water, juice, or soup when you can drink without vomiting. Make sure you have little or no nausea before eating solid foods. General instructions Have a responsible adult stay with you for the time you are told. It is important to have someone help care for you until you are awake and alert. Take over-the-counter and prescription medicines only as told by your health care provider. If you have sleep apnea, surgery and certain medicines can increase your risk for breathing problems. Follow instructions from your health care provider about wearing your sleep device: Anytime you are sleeping, including during daytime naps. While taking prescription pain medicines, sleeping medicines, or medicines that make you drowsy. Avoid smoking. Keep all follow-up visits as told by your health care provider. This is important. Contact a health care provider if: You keep feeling nauseous or you keep vomiting. You feel light-headed. You are still sleepy or having trouble with balance after 24 hours. You develop a rash. You have a fever. You have redness or swelling around the IV site. Get help right away if: You have trouble breathing. You have new-onset confusion at  home. Summary For several hours after your procedure, you may feel tired. You may also be forgetful and have poor judgment. Have a responsible adult stay with you for the time you are told. It is important to have someone help care for you until you are awake and alert. Rest as told. Do not drive or operate machinery. Do not drink alcohol or take sleeping pills. Get help right away if you have trouble breathing, or if you suddenly become confused. This information is not intended to replace advice given to you by your health care provider. Make sure you discuss any questions you have with your health care provider. Document Revised: 10/03/2021 Document Reviewed: 10/01/2019 Elsevier Patient Education  [place].

## 2022-05-30 ENCOUNTER — Other Ambulatory Visit (HOSPITAL_COMMUNITY): Payer: Medicare Other

## 2022-05-30 ENCOUNTER — Other Ambulatory Visit (HOSPITAL_COMMUNITY): Payer: Self-pay | Admitting: *Deleted

## 2022-05-30 ENCOUNTER — Encounter (HOSPITAL_COMMUNITY): Payer: Self-pay | Admitting: *Deleted

## 2022-05-30 DIAGNOSIS — D500 Iron deficiency anemia secondary to blood loss (chronic): Secondary | ICD-10-CM

## 2022-05-30 DIAGNOSIS — Z944 Liver transplant status: Secondary | ICD-10-CM | POA: Diagnosis not present

## 2022-05-30 NOTE — Progress Notes (Signed)
Lab orders for CBC, Iron Panel and Ferr faxed to [facility] [facility] location @ [phone].

## 2022-05-31 ENCOUNTER — Encounter: Payer: Medicare Other | Admitting: Physician Assistant

## 2022-05-31 ENCOUNTER — Encounter (HOSPITAL_COMMUNITY): Payer: Medicare Other

## 2022-06-01 ENCOUNTER — Encounter (HOSPITAL_COMMUNITY)
Admission: RE | Admit: 2022-06-01 | Discharge: 2022-06-01 | Disposition: A | Discharge status: Home / Self Care | Payer: Medicare Other | Source: Ambulatory Visit | Attending: Gastroenterology | Admitting: Gastroenterology

## 2022-06-01 ENCOUNTER — Encounter (HOSPITAL_COMMUNITY): Payer: Self-pay

## 2022-06-01 VITALS — BP 153/71 | HR 77 | Temp 98.1°F | Resp 18 | Ht 64.0 in | Wt 145.7 lb

## 2022-06-01 DIAGNOSIS — Z01810 Encounter for preprocedural cardiovascular examination: Secondary | ICD-10-CM | POA: Insufficient documentation

## 2022-06-01 DIAGNOSIS — I10 Essential (primary) hypertension: Secondary | ICD-10-CM | POA: Insufficient documentation

## 2022-06-01 DIAGNOSIS — Z944 Liver transplant status: Secondary | ICD-10-CM | POA: Diagnosis not present

## 2022-06-01 DIAGNOSIS — D849 Immunodeficiency, unspecified: Secondary | ICD-10-CM | POA: Diagnosis not present

## 2022-06-04 ENCOUNTER — Ambulatory Visit (HOSPITAL_BASED_OUTPATIENT_CLINIC_OR_DEPARTMENT_OTHER): Payer: Medicare Other | Admitting: Anesthesiology

## 2022-06-04 ENCOUNTER — Encounter (HOSPITAL_COMMUNITY)
Admission: RE | Disposition: A | Discharge status: Home / Self Care | Payer: Self-pay | Source: Ambulatory Visit | Attending: Gastroenterology

## 2022-06-04 ENCOUNTER — Ambulatory Visit (HOSPITAL_COMMUNITY)
Admission: RE | Admit: 2022-06-04 | Discharge: 2022-06-04 | Disposition: A | Discharge status: Home / Self Care | Payer: Medicare Other | Source: Ambulatory Visit | Attending: Gastroenterology | Admitting: Gastroenterology

## 2022-06-04 ENCOUNTER — Other Ambulatory Visit: Payer: Self-pay

## 2022-06-04 ENCOUNTER — Ambulatory Visit (HOSPITAL_COMMUNITY): Payer: Medicare Other | Admitting: Anesthesiology

## 2022-06-04 ENCOUNTER — Encounter (HOSPITAL_COMMUNITY): Payer: Self-pay | Admitting: Gastroenterology

## 2022-06-04 DIAGNOSIS — K31819 Angiodysplasia of stomach and duodenum without bleeding: Secondary | ICD-10-CM

## 2022-06-04 DIAGNOSIS — Z1509 Genetic susceptibility to other malignant neoplasm: Secondary | ICD-10-CM | POA: Diagnosis not present

## 2022-06-04 DIAGNOSIS — K766 Portal hypertension: Secondary | ICD-10-CM | POA: Diagnosis not present

## 2022-06-04 DIAGNOSIS — Z944 Liver transplant status: Secondary | ICD-10-CM | POA: Diagnosis not present

## 2022-06-04 DIAGNOSIS — K219 Gastro-esophageal reflux disease without esophagitis: Secondary | ICD-10-CM

## 2022-06-04 DIAGNOSIS — K3189 Other diseases of stomach and duodenum: Secondary | ICD-10-CM

## 2022-06-04 DIAGNOSIS — I10 Essential (primary) hypertension: Secondary | ICD-10-CM | POA: Diagnosis not present

## 2022-06-04 DIAGNOSIS — K7460 Unspecified cirrhosis of liver: Secondary | ICD-10-CM | POA: Diagnosis not present

## 2022-06-04 DIAGNOSIS — Z9049 Acquired absence of other specified parts of digestive tract: Secondary | ICD-10-CM | POA: Insufficient documentation

## 2022-06-04 DIAGNOSIS — D638 Anemia in other chronic diseases classified elsewhere: Secondary | ICD-10-CM | POA: Insufficient documentation

## 2022-06-04 DIAGNOSIS — E039 Hypothyroidism, unspecified: Secondary | ICD-10-CM | POA: Diagnosis not present

## 2022-06-04 DIAGNOSIS — Z85828 Personal history of other malignant neoplasm of skin: Secondary | ICD-10-CM | POA: Diagnosis not present

## 2022-06-04 DIAGNOSIS — R1013 Epigastric pain: Secondary | ICD-10-CM

## 2022-06-04 DIAGNOSIS — Z85038 Personal history of other malignant neoplasm of large intestine: Secondary | ICD-10-CM | POA: Insufficient documentation

## 2022-06-04 DIAGNOSIS — D509 Iron deficiency anemia, unspecified: Secondary | ICD-10-CM | POA: Diagnosis not present

## 2022-06-04 HISTORY — PX: HOT HEMOSTASIS: SHX5433

## 2022-06-04 HISTORY — PX: ESOPHAGOGASTRODUODENOSCOPY (EGD) WITH PROPOFOL: SHX5813

## 2022-06-04 SURGERY — ESOPHAGOGASTRODUODENOSCOPY (EGD) WITH PROPOFOL
Anesthesia: General

## 2022-06-04 MED ORDER — PROPOFOL 10 MG/ML IV BOLUS
INTRAVENOUS | Status: DC | PRN
  Administered 2022-06-04: 20 mg via INTRAVENOUS
  Administered 2022-06-04: 40 mg via INTRAVENOUS
  Administered 2022-06-04 (×2): 20 mg via INTRAVENOUS
  Administered 2022-06-04: 50 mg via INTRAVENOUS
  Administered 2022-06-04: 20 mg via INTRAVENOUS
  Administered 2022-06-04: 10 mg via INTRAVENOUS
  Administered 2022-06-04 (×2): 20 mg via INTRAVENOUS

## 2022-06-04 MED ORDER — LACTATED RINGERS IV SOLN
INTRAVENOUS | Status: DC
Start: 2022-06-04 — End: 2022-06-04

## 2022-06-04 MED ORDER — LIDOCAINE HCL (CARDIAC) PF 100 MG/5ML IV SOSY
PREFILLED_SYRINGE | INTRAVENOUS | Status: DC | PRN
  Administered 2022-06-04: 50 mg via INTRAVENOUS

## 2022-06-04 MED ORDER — PANTOPRAZOLE SODIUM 40 MG PO TBEC: 40.0000 mg | DELAYED_RELEASE_TABLET | Freq: Two times a day (BID) | ORAL | 0 refills | Status: DC

## 2022-06-04 MED ORDER — GLUCAGON HCL RDNA (DIAGNOSTIC) 1 MG IJ SOLR
INTRAMUSCULAR | Status: DC | PRN
  Administered 2022-06-04: .25 mg via INTRAVENOUS

## 2022-06-04 NOTE — Transfer of Care (Signed)
Immediate Anesthesia Transfer of Care Note  Patient: Cassidy Barber  Procedure(s) Performed: ESOPHAGOGASTRODUODENOSCOPY (EGD) WITH PROPOFOL HOT HEMOSTASIS (ARGON PLASMA COAGULATION/BICAP)  Patient Location: Short Stay  Anesthesia Type:MAC  Level of Consciousness: awake, alert  and oriented  Airway & Oxygen Therapy: Patient Spontanous Breathing  Post-op Assessment: Report given to RN and Post -op Vital signs reviewed and stable  Post vital signs: Reviewed and stable  Last Vitals:  Vitals Value Taken Time  BP 125/68 06/04/22 1135  Temp 36.3 C 06/04/22 1135  Pulse 69 06/04/22 1135  Resp 20 06/04/22 1135  SpO2 98 % 06/04/22 1135    Last Pain:  Vitals:   06/04/22 1135  TempSrc: Oral  PainSc: 0-No pain      Patients Stated Pain Goal: 5 (95/18/84 1660)  Complications: No notable events documented.

## 2022-06-04 NOTE — Discharge Instructions (Signed)
You are being discharged to home.  Resume your previous diet.  Take Protonix (pantoprazole) 40 mg by mouth twice a day for three months.  RTC in 3 months, will recheck iron stores and hemoglobin at that time.

## 2022-06-04 NOTE — Interval H&P Note (Signed)
History and Physical Interval Note:  06/04/2022 10:04 AM  Cassidy Barber  has presented today for surgery, with the diagnosis of GAVE.  The various methods of treatment have been discussed with the patient and family. After consideration of risks, benefits and other options for treatment, the patient has consented to  Procedure(s) with comments: ESOPHAGOGASTRODUODENOSCOPY (EGD) WITH PROPOFOL (N/A) - 145 as a surgical intervention.  The patient's history has been reviewed, patient examined, no change in status, stable for surgery.  I have reviewed the patient's chart and labs.  Questions were answered to the patient's satisfaction.     [name]

## 2022-06-04 NOTE — Op Note (Signed)
[facility] Patient Name: Cassidy Barber Procedure Date: 06/04/2022 10:48 AM MRN: [id number] Date of Birth: 04-18-1951 Attending MD: Maylon Peppers ,  CSN: [id number] Age: 71 Admit Type: Outpatient Procedure:                Upper GI endoscopy Indications:              Watermelon stomach (GAVE syndrome) Providers:                Maylon Peppers, Janeece Riggers, RN, Charlsie Quest                            Theda Sers RN, RN, Aram Candela Referring MD:              Medicines:                Monitored Anesthesia Care Complications:            No immediate complications. Estimated Blood Loss:     Estimated blood loss: none. Procedure:                Pre-Anesthesia Assessment:                           - Prior to the procedure, a History and Physical                            was performed, and patient medications, allergies                            and sensitivities were reviewed. The patient's                            tolerance of previous anesthesia was reviewed.                           - The risks and benefits of the procedure and the                            sedation options and risks were discussed with the                            patient. All questions were answered and informed                            consent was obtained.                           - ASA Grade Assessment: III - A patient with severe                            systemic disease.                           After obtaining informed consent, the endoscope was                            passed under direct vision. Throughout  the                            procedure, the patient's blood pressure, pulse, and                            oxygen saturations were monitored continuously. The                            GIF-H190 ([id number]) scope was introduced through the                            mouth, and advanced to the second part of duodenum.                            The upper GI endoscopy was accomplished without                             difficulty. The patient tolerated the procedure                            well. Scope In: 11:06:51 AM Scope Out: 11:29:32 AM Total Procedure Duration: 0 hours 22 minutes 41 seconds  Findings:      The examined esophagus was normal. No gastric varices were observed upon       careful inspection inforward and retroflexed view      Mild portal hypertensive gastropathy was found in the entire examined       stomach.      Moderate gastric antral vascular ectasia without bleeding was present in       the entire examined stomach. Coagulation for bleeding prevention using       argon plasma at 0.3 liters/minute and 20 watts was successful.      The examined duodenum was normal. Impression:               - Normal esophagus.                           - Portal hypertensive gastropathy.                           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                            Treated with argon plasma coagulation (APC).                           - Normal examined duodenum.                           - No specimens collected. Moderate Sedation:      Per Anesthesia Care Recommendation:           - Discharge patient to home (ambulatory).                           - Resume previous diet.                           -  Use Protonix (pantoprazole) 40 mg PO BID for 3                            months.                           - RTC in 3 months, will recheck iron stores and                            hemoglobin at that time. Procedure Code(s):        --- Professional ---                           [phone], Esophagogastroduodenoscopy, flexible,                            transoral; with control of bleeding, any method Diagnosis Code(s):        --- Professional ---                           K76.6, Portal hypertension                           K31.89, Other diseases of stomach and duodenum                           K31.819, Angiodysplasia of stomach and duodenum                             without bleeding CPT copyright 2019 American Medical Association. All rights reserved. The codes documented in this report are preliminary and upon coder review may  be revised to meet current compliance requirements. Maylon Peppers, MD Maylon Peppers,  06/04/2022 11:39:40 AM This report has been signed electronically. Number of Addenda: 0

## 2022-06-04 NOTE — Anesthesia Preprocedure Evaluation (Signed)
Anesthesia Evaluation  Patient identified by MRN, date of birth, ID band Patient awake    Reviewed: Allergy & Precautions, H&P , NPO status , Patient's Chart, lab work & pertinent test results, reviewed documented beta blocker date and time   Airway Mallampati: II  TM Distance: >3 FB Neck ROM: full    Dental no notable dental hx.    Pulmonary neg pulmonary ROS,    Pulmonary exam normal breath sounds clear to auscultation       Cardiovascular Exercise Tolerance: Good hypertension, negative cardio ROS   Rhythm:regular Rate:Normal     Neuro/Psych Seizures -, Well Controlled,  PSYCHIATRIC DISORDERS Anxiety    GI/Hepatic GERD  Medicated,(+) Cirrhosis  (s/p Liver TX)      ,   Endo/Other  Hypothyroidism   Renal/GU negative Renal ROS  negative genitourinary   Musculoskeletal   Abdominal   Peds  Hematology  (+) Blood dyscrasia, anemia ,   Anesthesia Other Findings S/p Liver TX   Reproductive/Obstetrics negative OB ROS                             Anesthesia Physical  Anesthesia Plan  ASA: 3  Anesthesia Plan: General   Post-op Pain Management:    Induction:   PONV Risk Score and Plan: Propofol infusion  Airway Management Planned:   Additional Equipment:   Intra-op Plan:   Post-operative Plan:   Informed Consent: I have reviewed the patients History and Physical, chart, labs and discussed the procedure including the risks, benefits and alternatives for the proposed anesthesia with the patient or authorized representative who has indicated his/her understanding and acceptance.     Dental Advisory Given  Plan Discussed with: CRNA  Anesthesia Plan Comments:         Anesthesia Quick Evaluation

## 2022-06-07 ENCOUNTER — Encounter (HOSPITAL_COMMUNITY): Payer: Self-pay | Admitting: Gastroenterology

## 2022-06-07 ENCOUNTER — Ambulatory Visit (INDEPENDENT_AMBULATORY_CARE_PROVIDER_SITE_OTHER): Payer: Medicare Other | Admitting: Physician Assistant

## 2022-06-07 DIAGNOSIS — D0472 Carcinoma in situ of skin of left lower limb, including hip: Secondary | ICD-10-CM

## 2022-06-07 DIAGNOSIS — Z85828 Personal history of other malignant neoplasm of skin: Secondary | ICD-10-CM

## 2022-06-07 DIAGNOSIS — D485 Neoplasm of uncertain behavior of skin: Secondary | ICD-10-CM | POA: Diagnosis not present

## 2022-06-07 DIAGNOSIS — L988 Other specified disorders of the skin and subcutaneous tissue: Secondary | ICD-10-CM | POA: Diagnosis not present

## 2022-06-07 MED ORDER — ACITRETIN 25 MG PO CAPS: 25.0000 mg | ORAL_CAPSULE | Freq: Every day | ORAL | 11 refills | Status: DC

## 2022-06-07 NOTE — Patient Instructions (Signed)

## 2022-06-07 NOTE — Anesthesia Postprocedure Evaluation (Signed)
Anesthesia Post Note  Patient: SINAYA MINOGUE  Procedure(s) Performed: ESOPHAGOGASTRODUODENOSCOPY (EGD) WITH PROPOFOL HOT HEMOSTASIS (ARGON PLASMA COAGULATION/BICAP)  Patient location during evaluation: Phase II Anesthesia Type: General Level of consciousness: awake Pain management: pain level controlled Vital Signs Assessment: post-procedure vital signs reviewed and stable Respiratory status: spontaneous breathing and respiratory function stable Cardiovascular status: blood pressure returned to baseline and stable Postop Assessment: no headache and no apparent nausea or vomiting Anesthetic complications: no Comments: Late entry   No notable events documented.   Last Vitals:  Vitals:   06/04/22 0929 06/04/22 1135  BP: (!) 144/98 125/68  Pulse: 68 69  Resp: 16 20  Temp: 36.5 C (!) 36.3 C  SpO2: 100% 98%    Last Pain:  Vitals:   06/04/22 1135  TempSrc: Oral  PainSc: 0-No pain                 Louann Sjogren

## 2022-06-08 ENCOUNTER — Ambulatory Visit (INDEPENDENT_AMBULATORY_CARE_PROVIDER_SITE_OTHER): Payer: Medicare Other | Admitting: Family

## 2022-06-08 ENCOUNTER — Encounter: Payer: Self-pay | Admitting: Family

## 2022-06-08 VITALS — BP 105/68 | HR 77 | Temp 98.0°F | Ht 64.0 in | Wt 143.2 lb

## 2022-06-08 DIAGNOSIS — Z79899 Other long term (current) drug therapy: Secondary | ICD-10-CM | POA: Diagnosis not present

## 2022-06-08 DIAGNOSIS — F1320 Sedative, hypnotic or anxiolytic dependence, uncomplicated: Secondary | ICD-10-CM | POA: Diagnosis not present

## 2022-06-08 DIAGNOSIS — L509 Urticaria, unspecified: Secondary | ICD-10-CM

## 2022-06-08 DIAGNOSIS — K31819 Angiodysplasia of stomach and duodenum without bleeding: Secondary | ICD-10-CM

## 2022-06-08 DIAGNOSIS — E039 Hypothyroidism, unspecified: Secondary | ICD-10-CM

## 2022-06-08 DIAGNOSIS — M5432 Sciatica, left side: Secondary | ICD-10-CM

## 2022-06-08 DIAGNOSIS — R1013 Epigastric pain: Secondary | ICD-10-CM

## 2022-06-08 DIAGNOSIS — G4700 Insomnia, unspecified: Secondary | ICD-10-CM

## 2022-06-08 DIAGNOSIS — D692 Other nonthrombocytopenic purpura: Secondary | ICD-10-CM

## 2022-06-08 DIAGNOSIS — D509 Iron deficiency anemia, unspecified: Secondary | ICD-10-CM

## 2022-06-08 DIAGNOSIS — Z944 Liver transplant status: Secondary | ICD-10-CM

## 2022-06-08 DIAGNOSIS — K219 Gastro-esophageal reflux disease without esophagitis: Secondary | ICD-10-CM

## 2022-06-08 DIAGNOSIS — I10 Essential (primary) hypertension: Secondary | ICD-10-CM

## 2022-06-08 DIAGNOSIS — Z1509 Genetic susceptibility to other malignant neoplasm: Secondary | ICD-10-CM

## 2022-06-08 DIAGNOSIS — F411 Generalized anxiety disorder: Secondary | ICD-10-CM

## 2022-06-08 DIAGNOSIS — E7849 Other hyperlipidemia: Secondary | ICD-10-CM | POA: Diagnosis not present

## 2022-06-08 MED ORDER — PANTOPRAZOLE SODIUM 40 MG PO TBEC: 40.0000 mg | DELAYED_RELEASE_TABLET | Freq: Two times a day (BID) | ORAL | 0 refills | Status: DC

## 2022-06-08 MED ORDER — CETIRIZINE HCL 10 MG PO TABS: 10.0000 mg | ORAL_TABLET | Freq: Every day | ORAL | 3 refills | Status: DC | PRN

## 2022-06-08 MED ORDER — LEVOTHYROXINE SODIUM 112 MCG PO TABS: 112.0000 ug | ORAL_TABLET | Freq: Every day | ORAL | 4 refills | Status: DC

## 2022-06-08 MED ORDER — CYCLOBENZAPRINE HCL 10 MG PO TABS
10.0000 mg | ORAL_TABLET | Freq: Three times a day (TID) | ORAL | 1 refills | Status: DC | PRN
Start: 2022-06-08 — End: 2023-02-19

## 2022-06-08 MED ORDER — ALPRAZOLAM 0.5 MG PO TABS
0.5000 mg | ORAL_TABLET | Freq: Every day | ORAL | 1 refills | Status: DC
Start: 2022-06-08 — End: 2022-09-25

## 2022-06-08 MED ORDER — GABAPENTIN 100 MG PO CAPS: 100.0000 mg | ORAL_CAPSULE | Freq: Every day | ORAL | 0 refills | Status: DC

## 2022-06-08 MED ORDER — METOPROLOL SUCCINATE ER 25 MG PO TB24: 25.0000 mg | ORAL_TABLET | Freq: Every day | ORAL | 4 refills | Status: DC

## 2022-06-08 MED ORDER — LOSARTAN POTASSIUM 25 MG PO TABS: 25.0000 mg | ORAL_TABLET | Freq: Every day | ORAL | 4 refills | Status: DC

## 2022-06-08 MED ORDER — FAMOTIDINE 20 MG PO TABS
20.0000 mg | ORAL_TABLET | Freq: Two times a day (BID) | ORAL | 5 refills | Status: DC
Start: 2022-06-08 — End: 2023-07-18

## 2022-06-08 NOTE — Progress Notes (Signed)
Subjective:    Patient ID: Cassidy Barber, female    DOB: 12-02-1950, 71 y.o.   MRN: [id number]  Chief Complaint  Patient presents with   Medical Management of Chronic Issues    Patient states she has no energy    PT presents to the office today for chronic follow up. She is followed by [facility] for hx of liver transplant. She had an incarcerated ventral hernia with fluid in the hernia sac 04/05/18. She is scheduled for EGG on 02/14/22. She has been diagnosed with GAVE.    She is followed by Hemologists every 3 months for iron deficiency anemia.      She had a left reverse shoulder 07/17/18 and then had to redone in 09/27/19. She is scheduled for reverse shoulder arthroplasty on 07/20/21 on her right shoulder.    She currently has skin cancer on her scalp and is completed 30 radiation treatments then had 20 radiation treatments on another.  She is followed by dermatologists for recurrent skin cancer. Has purpura senilis. Stable.   She has Lynch syndrome.  Hypertension This is a chronic problem. The current episode started more than 1 year ago. The problem has been resolved since onset. Associated symptoms include anxiety and malaise/fatigue. Pertinent negatives include no peripheral edema or shortness of breath. Risk factors for coronary artery disease include dyslipidemia. The current treatment provides moderate improvement. There is no history of heart failure. Identifiable causes of hypertension include a thyroid problem.  Gastroesophageal Reflux She complains of belching, heartburn and a hoarse voice. This is a chronic problem. The current episode started more than 1 year ago. The problem occurs occasionally. Associated symptoms include fatigue. She has tried a PPI for the symptoms. The treatment provided moderate relief.  Thyroid Problem Presents for follow-up visit. Symptoms include anxiety, diarrhea, dry skin, fatigue and hoarse voice. Patient reports no constipation. The symptoms have been  worsening. There is no history of heart failure.  Anemia Presents for follow-up visit. Symptoms include malaise/fatigue. There has been no confusion. There is no history of heart failure.  Insomnia Primary symptoms: difficulty falling asleep, frequent awakening, malaise/fatigue.   The current episode started more than one year. The onset quality is gradual. The problem occurs intermittently.  Anxiety Presents for follow-up visit. Symptoms include excessive worry, insomnia and nervous/anxious behavior. Patient reports no confusion or shortness of breath. Symptoms occur occasionally. The severity of symptoms is mild.   Her past medical history is significant for anemia.      Review of Systems  Constitutional:  Positive for fatigue and malaise/fatigue.  HENT:  Positive for hoarse voice.   Respiratory:  Negative for shortness of breath.   Gastrointestinal:  Positive for diarrhea and heartburn. Negative for constipation.  Psychiatric/Behavioral:  Negative for confusion. The patient is nervous/anxious and has insomnia.   All other systems reviewed and are negative.      Objective:   Physical Exam Vitals reviewed.  Constitutional:      General: She is not in acute distress.    Appearance: She is well-developed.  HENT:     Head: Normocephalic and atraumatic.     Right Ear: Tympanic membrane normal.     Left Ear: Tympanic membrane normal.  Eyes:     Pupils: Pupils are equal, round, and reactive to light.  Neck:     Thyroid: No thyromegaly.  Cardiovascular:     Rate and Rhythm: Normal rate and regular rhythm.     Heart sounds: Normal heart sounds. No  murmur heard. Pulmonary:     Effort: Pulmonary effort is normal. No respiratory distress.     Breath sounds: Normal breath sounds. No wheezing.  Abdominal:     General: Bowel sounds are normal. There is no distension.     Palpations: Abdomen is soft.     Tenderness: There is no abdominal tenderness.  Musculoskeletal:         General: No tenderness. Normal range of motion.     Cervical back: Normal range of motion and neck supple.  Skin:    General: Skin is warm and dry.     Findings: Rash present.  Neurological:     Mental Status: She is alert and oriented to person, place, and time.     Cranial Nerves: No cranial nerve deficit.     Deep Tendon Reflexes: Reflexes are normal and symmetric.  Psychiatric:        Behavior: Behavior normal.        Thought Content: Thought content normal.        Judgment: Judgment normal.       BP 105/68   Pulse 77   Temp 98 F (36.7 C) (Temporal)   Ht _0  (1.626 m)   Wt 143 lb 3.2 oz (65 kg)   SpO2 98%   BMI 24.58 kg/m      Assessment & Plan:  Cassidy Barber comes in today with chief complaint of Medical Management of Chronic Issues (Patient states she has no energy )   Diagnosis and orders addressed:  1. GAD (generalized anxiety disorder) - ALPRAZolam (XANAX) 0.5 MG tablet; Take 1 tablet (0.5 mg total) by mouth at bedtime.  Dispense: 90 tablet; Refill: 1 - ToxASSURE Select 13 (MW), Urine - CMP14+EGFR  2. Controlled substance agreement signed - ALPRAZolam (XANAX) 0.5 MG tablet; Take 1 tablet (0.5 mg total) by mouth at bedtime.  Dispense: 90 tablet; Refill: 1 - ToxASSURE Select 13 (MW), Urine - CMP14+EGFR  3. Benzodiazepine dependence (HCC) - ALPRAZolam (XANAX) 0.5 MG tablet; Take 1 tablet (0.5 mg total) by mouth at bedtime.  Dispense: 90 tablet; Refill: 1 - CMP14+EGFR  4. Epigastric pain - pantoprazole (PROTONIX) 40 MG tablet; Take 1 tablet (40 mg total) by mouth 2 (two) times daily.  Dispense: 180 tablet; Refill: 0 - CMP14+EGFR  5. Gastroesophageal reflux disease, unspecified whether esophagitis present - pantoprazole (PROTONIX) 40 MG tablet; Take 1 tablet (40 mg total) by mouth 2 (two) times daily.  Dispense: 180 tablet; Refill: 0 - CMP14+EGFR  6. Essential hypertension, benign - metoprolol succinate (TOPROL XL) 25 MG 24 hr tablet; Take 1 tablet  (25 mg total) by mouth daily.  Dispense: 90 tablet; Refill: 4 - losartan (COZAAR) 25 MG tablet; Take 1 tablet (25 mg total) by mouth daily.  Dispense: 90 tablet; Refill: 4 - CMP14+EGFR  7. Left sciatic nerve pain - gabapentin (NEURONTIN) 100 MG capsule; Take 1 capsule (100 mg total) by mouth daily.  Dispense: 90 capsule; Refill: 0 - CMP14+EGFR  8. Urticaria - cetirizine (ZYRTEC) 10 MG tablet; Take 1 tablet (10 mg total) by mouth daily as needed for allergies.  Dispense: 90 tablet; Refill: 3 - famotidine (PEPCID) 20 MG tablet; Take 1 tablet (20 mg total) by mouth 2 (two) times daily.  Dispense: 60 tablet; Refill: 5 - CMP14+EGFR  9. Primary hypertension - CMP14+EGFR  10. GAVE (gastric antral vascular ectasia) - CMP14+EGFR  11. Hypothyroidism, unspecified type - Thyroid Panel With TSH - CMP14+EGFR  12. Hx of liver transplant ([place]) -  CMP14+EGFR  13. Insomnia, unspecified type - CMP14+EGFR  14. Iron deficiency anemia, unspecified iron deficiency anemia type - CMP14+EGFR  15. Lynch syndrome - CMP14+EGFR  16. Purpura senilis ([place]) - CMP14+EGFR  17. Other hyperlipidemia - Lipid panel   Labs pending Patient reviewed in [place] controlled database, no flags noted. Contract and drug screen up dated today.  Health Maintenance reviewed Diet and exercise encouraged  Follow up plan: 3 months    [name], FNP

## 2022-06-08 NOTE — Patient Instructions (Signed)
Fatigue If you have fatigue, you feel tired all the time and have a lack of energy or a lack of motivation. Fatigue may make it difficult to start or complete tasks because of exhaustion. Occasional or mild fatigue is often a normal response to activity or life. However, long-term (chronic) or extreme fatigue may be a symptom of a medical condition such as: Depression. Not having enough red blood cells or hemoglobin in the blood (anemia). A problem with a small gland located in the lower front part of the neck (thyroid disorder). Rheumatologic conditions. These are problems related to the body's defense system (immune system). Infections, especially certain viral infections. Fatigue can also lead to negative health outcomes over time. Follow these instructions at home: Medicines Take over-the-counter and prescription medicines only as told by your health care provider. Take a multivitamin if told by your health care provider. Do not use herbal or dietary supplements unless they are approved by your health care provider. Eating and drinking  Avoid heavy meals in the evening. Eat a well-balanced diet, which includes lean proteins, whole grains, plenty of fruits and vegetables, and low-fat dairy products. Avoid eating or drinking too many products with caffeine in them. Avoid alcohol. Drink enough fluid to keep your urine pale yellow. Activity  Exercise regularly, as told by your health care provider. Use or practice techniques to help you relax, such as yoga, tai chi, meditation, or massage therapy. Lifestyle Change situations that cause you stress. Try to keep your work and personal schedules in balance. Do not use recreational or illegal drugs. General instructions Monitor your fatigue for any changes. Go to bed and get up at the same time every day. Avoid fatigue by pacing yourself during the day and getting enough sleep at night. Maintain a healthy weight. Contact a health care  provider if: Your fatigue does not get better. You have a fever. You suddenly lose or gain weight. You have headaches. You have trouble falling asleep or sleeping through the night. You feel angry, guilty, anxious, or sad. You have swelling in your legs or another part of your body. Get help right away if: You feel confused, feel like you might faint, or faint. Your vision is blurry or you have a severe headache. You have severe pain in your abdomen, your back, or the area between your waist and hips (pelvis). You have chest pain, shortness of breath, or an irregular or fast heartbeat. You are unable to urinate, or you urinate less than normal. You have abnormal bleeding from the rectum, nose, lungs, nipples, or, if you are female, the vagina. You vomit blood. You have thoughts about hurting yourself or others. These symptoms may be an emergency. Get help right away. Call 911. Do not wait to see if the symptoms will go away. Do not drive yourself to the hospital. Get help right away if you feel like you may hurt yourself or others, or have thoughts about taking your own life. Go to your nearest emergency room or: Call 911. Call the [facility] at [phone] or 988. This is open 24 hours a day. Text the Crisis Text Line at 979-823-0385. Summary If you have fatigue, you feel tired all the time and have a lack of energy or a lack of motivation. Fatigue may make it difficult to start or complete tasks because of exhaustion. Long-term (chronic) or extreme fatigue may be a symptom of a medical condition. Exercise regularly, as told by your health care provider.  Change situations that cause you stress. Try to keep your work and personal schedules in balance. This information is not intended to replace advice given to you by your health care provider. Make sure you discuss any questions you have with your health care provider. Document Revised: 08/21/2021 Document  Reviewed: 08/21/2021 Elsevier Patient Education  2023 [employer]  

## 2022-06-09 LAB — CMP14+EGFR
ALT: 16 IU/L (ref 0–32)
AST: 28 IU/L (ref 0–40)
Albumin/Globulin Ratio: 1.2 (ref 1.2–2.2)
Albumin: 4 g/dL (ref 3.8–4.8)
Alkaline Phosphatase: 163 IU/L — ABNORMAL HIGH (ref 44–121)
BUN/Creatinine Ratio: 19 (ref 12–28)
BUN: 18 mg/dL (ref 8–27)
Bilirubin Total: <0.2 mg/dL (ref 0.0–1.2)
CO2: 21 mmol/L (ref 20–29)
Calcium: 9.3 mg/dL (ref 8.7–10.3)
Chloride: 98 mmol/L (ref 96–106)
Creatinine, Ser: 0.93 mg/dL (ref 0.57–1.00)
Globulin, Total: 3.4 g/dL (ref 1.5–4.5)
Glucose: 102 mg/dL — ABNORMAL HIGH (ref 70–99)
Potassium: 4.4 mmol/L (ref 3.5–5.2)
Sodium: 137 mmol/L (ref 134–144)
Total Protein: 7.4 g/dL (ref 6.0–8.5)
eGFR: 66 mL/min/{1.73_m2} (ref >59)

## 2022-06-09 LAB — LIPID PANEL
Chol/HDL Ratio: 2.9 ratio (ref 0.0–4.4)
Cholesterol, Total: 209 mg/dL — ABNORMAL HIGH (ref 100–199)
HDL: 73 mg/dL (ref >39)
LDL Chol Calc (NIH): 112 mg/dL — ABNORMAL HIGH (ref 0–99)
Triglycerides: 137 mg/dL (ref 0–149)
VLDL Cholesterol Cal: 24 mg/dL (ref 5–40)

## 2022-06-09 LAB — THYROID PANEL WITH TSH
Free Thyroxine Index: 3.1 (ref 1.2–4.9)
T3 Uptake Ratio: 31 % (ref 24–39)
T4, Total: 9.9 ug/dL (ref 4.5–12.0)
TSH: 3.01 u[IU]/mL (ref 0.450–4.500)

## 2022-06-13 ENCOUNTER — Other Ambulatory Visit (HOSPITAL_COMMUNITY): Payer: Self-pay | Admitting: Physician Assistant

## 2022-06-13 DIAGNOSIS — D500 Iron deficiency anemia secondary to blood loss (chronic): Secondary | ICD-10-CM | POA: Diagnosis not present

## 2022-06-14 ENCOUNTER — Encounter: Payer: Medicare Other | Admitting: Physician Assistant

## 2022-06-14 LAB — CBC WITH DIFFERENTIAL/PLATELET
Basophils Absolute: 0.1 10*3/uL (ref 0.0–0.2)
Basos: 2 %
EOS (ABSOLUTE): 0.2 10*3/uL (ref 0.0–0.4)
Eos: 3 %
Hematocrit: 36.9 % (ref 34.0–46.6)
Hemoglobin: 12.1 g/dL (ref 11.1–15.9)
Immature Grans (Abs): 0 10*3/uL (ref 0.0–0.1)
Immature Granulocytes: 0 %
Lymphocytes Absolute: 1.6 10*3/uL (ref 0.7–3.1)
Lymphs: 27 %
MCH: 28.7 pg (ref 26.6–33.0)
MCHC: 32.8 g/dL (ref 31.5–35.7)
MCV: 88 fL (ref 79–97)
Monocytes Absolute: 1 10*3/uL — ABNORMAL HIGH (ref 0.1–0.9)
Monocytes: 17 %
Neutrophils Absolute: 3.1 10*3/uL (ref 1.4–7.0)
Neutrophils: 51 %
Platelets: 314 10*3/uL (ref 150–450)
RBC: 4.21 x10E6/uL (ref 3.77–5.28)
RDW: 13.1 % (ref 11.7–15.4)
WBC: 6 10*3/uL (ref 3.4–10.8)

## 2022-06-14 LAB — IRON AND TIBC
Iron Saturation: 18 % (ref 15–55)
Iron: 46 ug/dL (ref 27–139)
Total Iron Binding Capacity: 260 ug/dL (ref 250–450)
UIBC: 214 ug/dL (ref 118–369)

## 2022-06-14 LAB — FERRITIN: Ferritin: 127 ng/mL (ref 15–150)

## 2022-06-14 LAB — TOXASSURE SELECT 13 (MW), URINE

## 2022-06-21 ENCOUNTER — Encounter: Payer: Self-pay | Admitting: Physician Assistant

## 2022-06-21 NOTE — Progress Notes (Signed)
   Follow-Up Visit   Subjective  Cassidy Barber is a 71 y.o. female who presents for the following: Follow-up (Patient here today for non healing wound on her left lower leg, per patient the wound is painful. ).   The following portions of the chart were reviewed this encounter and updated as appropriate:  Tobacco  Allergies  Meds  Problems  Med Hx  Surg Hx  Fam Hx      Objective  Well appearing patient in no apparent distress; mood and affect are within normal limits.  A full examination was performed including scalp, head, eyes, ears, nose, lips, neck, chest, axillae, abdomen, back, buttocks, bilateral upper extremities, bilateral lower extremities, hands, feet, fingers, toes, fingernails, and toenails. All findings within normal limits unless otherwise noted below.  Left Lower Leg - Anterior Ulcerated scale with pink base         Assessment & Plan  Neoplasm of uncertain behavior of skin (2) Left Lower Leg - Anterior  Skin / nail biopsy Type of biopsy: punch   Informed consent: discussed and consent obtained   Timeout: patient name, date of birth, surgical site, and procedure verified   Procedure prep:  Patient was prepped and draped in usual sterile fashion (Non sterile) Prep type:  Chlorhexidine Anesthesia: the lesion was anesthetized in a standard fashion   Anesthetic:  1% lidocaine w/ epinephrine 1-100,000 local infiltration Punch size:  8 mm Suture removal (days):  14 Hemostasis achieved with: suture   Outcome: patient tolerated procedure well    Specimen 1 - Surgical pathology Differential Diagnosis: bcc vs scc - punch   Check Margins: yes  Left Lower Leg - Anterior  Skin excision  Total excision diameter (cm):  4.5 Informed consent: discussed and consent obtained   Timeout: patient name, date of birth, surgical site, and procedure verified   Anesthesia: the lesion was anesthetized in a standard fashion   Anesthetic:  1% lidocaine w/ epinephrine  1-100,000 local infiltration Instrument used: #15 blade   Hemostasis achieved with: pressure and electrodesiccation   Outcome: patient tolerated procedure well with no complications   Post-procedure details: sterile dressing applied and wound care instructions given   Dressing type: bandage, petrolatum and pressure dressing    Skin repair Complexity:  Simple Final length (cm):  4.5 Informed consent: discussed and consent obtained   Timeout: patient name, date of birth, surgical site, and procedure verified   Procedure prep:  Patient was prepped and draped in usual sterile fashion (non sterile) Prep type:  Chlorhexidine Anesthesia: the lesion was anesthetized in a standard fashion   Undermining: edges undermined   Fine/surface layer approximation (top stitches):  Suture size:  3-0 Suture type: Vicryl Rapide (coated polyglactin 910)   Suture type comment:  Nylon Stitches: simple running   Hemostasis achieved with: suture, pressure and electrodesiccation Outcome: patient tolerated procedure well with no complications   Post-procedure details: wound care instructions given   Post-procedure details comment:  Non sterile pressure  Additional details:  3-0 Vicryl x 3  3-0 Prolene x 6  Specimen 2 - Surgical pathology Differential Diagnosis: R/O BCC vs SCC - excision  Check Margins: yes  Personal history of squamous cell carcinoma of skin  Related Medications acitretin (SORIATANE) 25 MG capsule Take 1 capsule (25 mg total) by mouth daily.    I, Marshella Tello, PA-C, have reviewed all documentation's for this visit.  The documentation on 06/21/22 for the exam, diagnosis, procedures and orders are all accurate and complete.

## 2022-06-26 DIAGNOSIS — Z944 Liver transplant status: Secondary | ICD-10-CM | POA: Diagnosis not present

## 2022-06-28 ENCOUNTER — Encounter: Payer: Medicare Other | Admitting: Physician Assistant

## 2022-07-02 ENCOUNTER — Ambulatory Visit (INDEPENDENT_AMBULATORY_CARE_PROVIDER_SITE_OTHER): Payer: Medicare Other

## 2022-07-02 VITALS — Wt 143.0 lb

## 2022-07-02 DIAGNOSIS — Z0000 Encounter for general adult medical examination without abnormal findings: Secondary | ICD-10-CM

## 2022-07-02 NOTE — Patient Instructions (Signed)
Cassidy Barber , Thank you for taking time to come for your Medicare Wellness Visit. I appreciate your ongoing commitment to your health goals. Please review the following plan we discussed and let me know if I can assist you in the future.   Screening recommendations/referrals: Colonoscopy: Sigmoidoscopy done 12/20/2021 - Repeat in 5 years  Mammogram: Done 03/01/2022 - Repeat annually  Bone Density: Done 08/25/2021 - Repeat every 2 years  Recommended yearly ophthalmology/optometry visit for glaucoma screening and checkup Recommended yearly dental visit for hygiene and checkup  Vaccinations: Influenza vaccine: Done 08/23/2021 - Repeat annually  Pneumococcal vaccine: Done 07/13/2012 & 07/26/2017 Tdap vaccine: Done 07/01/2019 - Repeat in 10 years  Shingles vaccine: Done 04/21/2018 & 06/23/2018   Covid-19:Done 01/07/2020, 02/05/2020, 07/13/2020  Advanced directives: Advance directive discussed with you today. Even though you declined this today, please call our office should you change your mind, and we can give you the proper paperwork for you to fill out.   Conditions/risks identified: Aim for 30 minutes of exercise or brisk walking, 6-8 glasses of water, and 5 servings of fruits and vegetables each day.   Next appointment: Follow up in one year for your annual wellness visit    Preventive Care 65 Years and Older, Female Preventive care refers to lifestyle choices and visits with your health care provider that can promote health and wellness. What does preventive care include? A yearly physical exam. This is also called an annual well check. Dental exams once or twice a year. Routine eye exams. Ask your health care provider how often you should have your eyes checked. Personal lifestyle choices, including: Daily care of your teeth and gums. Regular physical activity. Eating a healthy diet. Avoiding tobacco and drug use. Limiting alcohol use. Practicing safe sex. Taking low-dose aspirin every  day. Taking vitamin and mineral supplements as recommended by your health care provider. What happens during an annual well check? The services and screenings done by your health care provider during your annual well check will depend on your age, overall health, lifestyle risk factors, and family history of disease. Counseling  Your health care provider may ask you questions about your: Alcohol use. Tobacco use. Drug use. Emotional well-being. Home and relationship well-being. Sexual activity. Eating habits. History of falls. Memory and ability to understand (cognition). Work and work Statistician. Reproductive health. Screening  You may have the following tests or measurements: Height, weight, and BMI. Blood pressure. Lipid and cholesterol levels. These may be checked every 5 years, or more frequently if you are over 71 years old. Skin check. Lung cancer screening. You may have this screening every year starting at age 80 if you have a 30-pack-year history of smoking and currently smoke or have quit within the past 15 years. Fecal occult blood test (FOBT) of the stool. You may have this test every year starting at age 54. Flexible sigmoidoscopy or colonoscopy. You may have a sigmoidoscopy every 5 years or a colonoscopy every 10 years starting at age 50. Hepatitis C blood test. Hepatitis B blood test. Sexually transmitted disease (STD) testing. Diabetes screening. This is done by checking your blood sugar (glucose) after you have not eaten for a while (fasting). You may have this done every 1-3 years. Bone density scan. This is done to screen for osteoporosis. You may have this done starting at age 59. Mammogram. This may be done every 1-2 years. Talk to your health care provider about how often you should have regular mammograms. Talk with your  health care provider about your test results, treatment options, and if necessary, the need for more tests. Vaccines  Your health care  provider may recommend certain vaccines, such as: Influenza vaccine. This is recommended every year. Tetanus, diphtheria, and acellular pertussis (Tdap, Td) vaccine. You may need a Td booster every 10 years. Zoster vaccine. You may need this after age 47. Pneumococcal 13-valent conjugate (PCV13) vaccine. One dose is recommended after age 77. Pneumococcal polysaccharide (PPSV23) vaccine. One dose is recommended after age 71. Talk to your health care provider about which screenings and vaccines you need and how often you need them. This information is not intended to replace advice given to you by your health care provider. Make sure you discuss any questions you have with your health care provider. Document Released: 11/25/2015 Document Revised: 07/18/2016 Document Reviewed: 08/30/2015 Elsevier Interactive Patient Education  2017 [employer] Prevention in the Home Falls can cause injuries. They can happen to people of all ages. There are many things you can do to make your home safe and to help prevent falls. What can I do on the outside of my home? Regularly fix the edges of walkways and driveways and fix any cracks. Remove anything that might make you trip as you walk through a door, such as a raised step or threshold. Trim any bushes or trees on the path to your home. Use bright outdoor lighting. Clear any walking paths of anything that might make someone trip, such as rocks or tools. Regularly check to see if handrails are loose or broken. Make sure that both sides of any steps have handrails. Any raised decks and porches should have guardrails on the edges. Have any leaves, snow, or ice cleared regularly. Use sand or salt on walking paths during winter. Clean up any spills in your garage right away. This includes oil or grease spills. What can I do in the bathroom? Use night lights. Install grab bars by the toilet and in the tub and shower. Do not use towel bars as grab  bars. Use non-skid mats or decals in the tub or shower. If you need to sit down in the shower, use a plastic, non-slip stool. Keep the floor dry. Clean up any water that spills on the floor as soon as it happens. Remove soap buildup in the tub or shower regularly. Attach bath mats securely with double-sided non-slip rug tape. Do not have throw rugs and other things on the floor that can make you trip. What can I do in the bedroom? Use night lights. Make sure that you have a light by your bed that is easy to reach. Do not use any sheets or blankets that are too big for your bed. They should not hang down onto the floor. Have a firm chair that has side arms. You can use this for support while you get dressed. Do not have throw rugs and other things on the floor that can make you trip. What can I do in the kitchen? Clean up any spills right away. Avoid walking on wet floors. Keep items that you use a lot in easy-to-reach places. If you need to reach something above you, use a strong step stool that has a grab bar. Keep electrical cords out of the way. Do not use floor polish or wax that makes floors slippery. If you must use wax, use non-skid floor wax. Do not have throw rugs and other things on the floor that can make you trip. What  can I do with my stairs? Do not leave any items on the stairs. Make sure that there are handrails on both sides of the stairs and use them. Fix handrails that are broken or loose. Make sure that handrails are as long as the stairways. Check any carpeting to make sure that it is firmly attached to the stairs. Fix any carpet that is loose or worn. Avoid having throw rugs at the top or bottom of the stairs. If you do have throw rugs, attach them to the floor with carpet tape. Make sure that you have a light switch at the top of the stairs and the bottom of the stairs. If you do not have them, ask someone to add them for you. What else can I do to help prevent  falls? Wear shoes that: Do not have high heels. Have rubber bottoms. Are comfortable and fit you well. Are closed at the toe. Do not wear sandals. If you use a stepladder: Make sure that it is fully opened. Do not climb a closed stepladder. Make sure that both sides of the stepladder are locked into place. Ask someone to hold it for you, if possible. Clearly mark and make sure that you can see: Any grab bars or handrails. First and last steps. Where the edge of each step is. Use tools that help you move around (mobility aids) if they are needed. These include: Canes. Walkers. Scooters. Crutches. Turn on the lights when you go into a dark area. Replace any light bulbs as soon as they burn out. Set up your furniture so you have a clear path. Avoid moving your furniture around. If any of your floors are uneven, fix them. If there are any pets around you, be aware of where they are. Review your medicines with your doctor. Some medicines can make you feel dizzy. This can increase your chance of falling. Ask your doctor what other things that you can do to help prevent falls. This information is not intended to replace advice given to you by your health care provider. Make sure you discuss any questions you have with your health care provider. Document Released: 08/25/2009 Document Revised: 04/05/2016 Document Reviewed: 12/03/2014 Elsevier Interactive Patient Education  2017 [employer].

## 2022-07-02 NOTE — Progress Notes (Signed)
Subjective:   Cassidy Barber is a 71 y.o. female who presents for Medicare Annual (Subsequent) preventive examination.  Virtual Visit via Telephone Note  I connected with  Cassidy Barber on 07/02/22 at  9:00 AM EDT by telephone and verified that I am speaking with the correct person using two identifiers.  Location: Patient: Home Provider: [facility] Persons participating in the virtual visit: patient/Nurse Health Advisor   I discussed the limitations, risks, security and privacy concerns of performing an evaluation and management service by telephone and the availability of in person appointments. The patient expressed understanding and agreed to proceed.  Interactive audio and video telecommunications were attempted between this nurse and patient, however failed, due to patient having technical difficulties OR patient did not have access to video capability.  We continued and completed visit with audio only.  Some vital signs may be absent or patient reported.   Monik Lins E Hugh Kamara, LPN   Review of Systems     Cardiac Risk Factors include: advanced age (>22mn, >>50women);hypertension;sedentary lifestyle     Objective:    Today's Vitals   07/02/22 0904  Weight: 143 lb (64.9 kg)   Body mass index is 24.55 kg/m.     07/02/2022    9:09 AM 06/04/2022    9:26 AM 06/01/2022   10:49 AM 04/05/2022    2:13 PM 02/14/2022    6:29 AM 01/19/2022    9:30 AM 01/09/2022    9:35 AM  Advanced Directives  Does Patient Have a Medical Advance Directive? No No No No No No No  Would patient like information on creating a medical advance directive? No - Patient declined No - Patient declined No - Patient declined No - Patient declined No - Patient declined No - Patient declined No - Patient declined    Current Medications (verified) Outpatient Encounter Medications as of 07/02/2022  Medication Sig   acetaminophen (TYLENOL) 500 MG tablet Take 1,000 mg by mouth every 6 (six) hours as needed (for pain.).     alendronate (FOSAMAX) 70 MG tablet TAKE 1 TABLET EVERY WEEK (Patient taking differently: Take 70 mg by mouth every Thursday. TAKE 1 TABLET EVERY WEEK)   ALPRAZolam (XANAX) 0.5 MG tablet Take 1 tablet (0.5 mg total) by mouth at bedtime.   Ascorbic Acid (VITAMIN C) 500 MG CHEW Chew 1,000 mg by mouth daily.   Biotin 1000 MCG tablet Take 1,000 mcg by mouth in the morning and at bedtime.   Biotin w/ Vitamins C & E (HAIR SKIN & NAILS GUMMIES PO) Take 1 tablet by mouth in the morning and at bedtime.   Calcium Citrate-Vitamin D (CALCIUM CITRATE + D3 PO) Take 1 tablet by mouth 2 (two) times daily.   cetirizine (ZYRTEC) 10 MG tablet Take 1 tablet (10 mg total) by mouth daily as needed for allergies.   Clobetasol Prop Emollient Base (CLOBETASOL PROPIONATE E) 0.05 % emollient cream Apply 1 application. topically 2 (two) times daily.   clotrimazole-betamethasone (LOTRISONE) cream Apply 1 application topically 2 (two) times daily. (Patient taking differently: Apply 1 application  topically daily as needed (Rash).)   Cyanocobalamin (VITAMIN B-12) 5000 MCG TBDP Take 5,000 mcg by mouth 2 (two) times a week.   cyclobenzaprine (FLEXERIL) 10 MG tablet Take 1 tablet (10 mg total) by mouth 3 (three) times daily as needed for muscle spasms.   famotidine (PEPCID) 20 MG tablet Take 1 tablet (20 mg total) by mouth 2 (two) times daily.   fluorouracil (EFUDEX) 5 %  cream Apply 1 application topically daily as needed (cancer spots).    gabapentin (NEURONTIN) 100 MG capsule Take 1 capsule (100 mg total) by mouth daily.   levothyroxine (SYNTHROID) 112 MCG tablet Take 1 tablet (112 mcg total) by mouth daily.   losartan (COZAAR) 25 MG tablet Take 1 tablet (25 mg total) by mouth daily.   metoprolol succinate (TOPROL XL) 25 MG 24 hr tablet Take 1 tablet (25 mg total) by mouth daily.   Multiple Vitamins-Minerals (MULTIVITAMIN WITH MINERALS) tablet Take 1 tablet by mouth daily.   mupirocin ointment (BACTROBAN) 2 % Apply 1 application  topically daily as needed (Cancer spots).   NIACINAMIDE-ZINC-FOLIC ACID PO Take 2 tablets by mouth 2 (two) times daily.   pantoprazole (PROTONIX) 40 MG tablet Take 1 tablet (40 mg total) by mouth 2 (two) times daily.   silver sulfADIAZINE (SILVADENE) 1 % cream Apply 1 Application topically daily. Apply to large surface area once to twice daily (Patient taking differently: Apply 1 Application topically daily as needed (skin irritation). Apply to large surface area once to twice daily)   ursodiol (ACTIGALL) 250 MG tablet Take 250 mg by mouth 2 (two) times daily.    Vitamin D, Ergocalciferol, (DRISDOL) 1.25 MG (50000 UNIT) CAPS capsule TAKE 1 CAPSULE BY MOUTH ONCE A WEEK (Patient taking differently: Take 50,000 Units by mouth every Tuesday.)   vitamin E 180 MG (400 UNITS) capsule Take 400 Units by mouth daily.   ZORTRESS 0.5 MG TABS Take 4 tablets (2 mg total) by mouth 2 (two) times daily.   [DISCONTINUED] acitretin (SORIATANE) 25 MG capsule Take 1 capsule (25 mg total) by mouth daily.   No facility-administered encounter medications on file as of 07/02/2022.    Allergies (verified) Ciprofloxacin and Codeine   History: Past Medical History:  Diagnosis Date   Abdominal wall hernia    Incarcerated status post surgical repair 2019 - [facility]   Anemia of chronic disease    Atypical nevus 01/21/2018   atypical neoplasm- Left scalp-ant (txpbx + MOHS), atypical neoplasm- Left scalp post- (txpbx + MOHS)   Basal cell carcinoma    Colon cancer ([place])    Colon surgery 2005 and 2012   History of pulmonary hypertension    Pre liver transplant   Hypertension    Hypothyroidism    Lynch syndrome    Osteopenia    Primary biliary cirrhosis ([place])    Status post liver transplantation - follows at [facility]   SCCA (squamous cell carcinoma) of skin 02/23/2020   Right Upper Chest(moderate) (MOH's)   SCCA (squamous cell carcinoma) of skin 04/07/2020   Left Top Leg (Keratoacanthoma) treatment after biopsy   SCCA  (squamous cell carcinoma) of skin 04/07/2020   Left Foot Dorsal (in situ) treatment after biopsy   SCCA (squamous cell carcinoma) of skin 03/27/2021   Right Upper Back (Keratoacanthoma) (excision) (clear)   SCCA (squamous cell carcinoma) of skin 06/13/2021   Right Shoulder - anterior (moderately differentiated) (tx p bx)   SCCA (squamous cell carcinoma) of skin 06/13/2021   Right Thigh - anterior (well differentiated) (tx p bx)   SCCA (squamous cell carcinoma) of skin 06/13/2021   Right Lower Leg - anterior (well differentiated) (tx p bx)   Squamous cell carcinoma of skin 04/22/2018   KA-Right mid chest (txpbx), KA-left elbow crease (txpbx), insitu-Right mid chest inf. (exc)   Squamous cell carcinoma of skin 05/20/2018   well diff-Left upper shin (txpbx), well diff-Right lower forearm (txpbx), well diff-Right upper shin (txpbx)  Squamous cell carcinoma of skin 06/11/2018   Scc + margin-Right mid chest inferior    Squamous cell carcinoma of skin 06/27/2018   well diff-Left mid thigh(txpbx), well diff-Left inner thigh (txpbx),insitu-Right cheek (txpbx),well diff-right inner heel (txpbx)   Squamous cell carcinoma of skin 09/16/2018   well diff-left shoulder (txpbx), well diff-Right chin (txpbx), well diff-right chest lateral (CX35FU)   Squamous cell carcinoma of skin 10/01/2018   Right outer lower shin (Txpbx)   Squamous cell carcinoma of skin 04/03/2019   well diff-Right center chest (MOHS), in situ-Right ear   Squamous cell carcinoma of skin 08/05/2019   in situ-Left calf (txpbx), in situ-left bicep (txpbx), well diff-Left chest,inf(txpbx), in situ-Right chest inf-(txpbx)   Squamous cell carcinoma of skin 11/19/2019   KA-left top leg (txpbx), modify-Riight forehead-(mohs), in situ-right hand (txpbx), in situ-Right forearm (txpbx), well diff-Right chest (txpbx), well diff-chin (txpbx)   Squamous cell carcinoma of skin 01/06/2020   KA- Left top leg   Squamous cell carcinoma of skin  05/12/2013   bowens-middle of chest (CX35FU)   Squamous cell carcinoma of skin 05/18/2015   well diff-Left upper arm (CX35FU + Exc),KA-right chest(txpbx), in situ-Left shin (txpbx), well diff-Right cheek (CX35FU), KA-Left post scalp (CX35FU)   Squamous cell carcinoma of skin 08/09/2015   KA-Left post scalp ((MOHS), in situ- mid chest (Txpbx +exc), in situ-Left upper arm inferior (txpbx)   Squamous cell carcinoma of skin 10/13/2015   Left upper arm-clear   Squamous cell carcinoma of skin 03/09/2016   mod diff-mid chest (txpbx+ exc), mod diff-Right chest (txpbx+exc), well diff-right cheek-(txpbx),well diff-Left hand-(txpbx), in situ-Left upper arm (txpbx), well diff-Right cheek -(txpbx), well diff-Right crease arm (txpbx)    Squamous cell carcinoma of skin 05/24/2016   well diff-Right nasal crease-(MOHS)   Squamous cell carcinoma of skin 08/02/2016   KA-Left chest med (txpbx)   Squamous cell carcinoma of skin 08/30/2016   in situ-Left outer zygoma (txpbx)   Squamous cell carcinoma of skin 12/06/2016   well diff-Left chest sup, Left shoudler, insitu- right post scalp   Squamous cell carcinoma of skin 02/14/2017   well diff-Left forearm (EXC),in situ-RIght ant neck   Squamous cell carcinoma of skin 06/14/2017   in situ-Right forearm (txpbx), in situ-Right chest (txpbx), well diff-left chest (txpbx), well diff-anterior neck- (txpbx)   Squamous cell carcinoma of skin 08/07/2017   well diff-Left upper shoulder (txpbx), sup and invasive-Left temple (txpbx), well diff-Right upper shin (txpbx), in situ-Right clavicle (txpbx)   Squamous cell carcinoma of skin 10/17/2017   well diff-ant. neck (MOHS), in situ-Right chest, inf (txpbx)   Squamous cell carcinoma of skin 01/21/2018   well diff- Right chest,ulnar (txpbx), well diff- right upper chest (txpbx), in situ-Right ant. crown (txpbx)   Squamous cell carcinoma of skin 08/02/2020   well diff-left lower leg-inferior (Txpbx)   Squamous cell carcinoma  of skin 08/02/2020   well diff-right lower leg-mid (txpbx)   Squamous cell carcinoma of skin 08/02/2020   well diff-left chest upper   Squamous cell carcinoma of skin 08/02/2020   well diff-mid parietal scalp (MOHS)   Squamous cell carcinoma of skin 08/02/2020   well diff-right foot inner(txpbx)   Squamous cell carcinoma of skin 08/02/2020   well diff- left lower leg medial (txpbx)   Squamous cell carcinoma of skin 08/02/2020   well diff-left lower leg anterior (txpbx)   Squamous cell carcinoma of skin 08/02/2020   well diff-left lower leg medial (txpbx)   Squamous cell carcinoma of skin 08/02/2020  well diff-right forearm-posterior (txpbx)   Past Surgical History:  Procedure Laterality Date   ABDOMINAL HERNIA REPAIR     Patient's states that she has had 8- 9 hernia surgeries   ABDOMINAL HYSTERECTOMY     BIOPSY  02/15/2021   Procedure: BIOPSY;  Surgeon: Rogene Houston, MD;  [facility];  Service: Endoscopy;;   BIOPSY  12/20/2021   Procedure: BIOPSY;  Surgeon: Rogene Houston, MD;  [facility];  Service: Endoscopy;;   CATARACT EXTRACTION W/PHACO Right 01/15/2020   Procedure: CATARACT EXTRACTION PHACO AND INTRAOCULAR LENS PLACEMENT ([place]);  Surgeon: Baruch Goldmann, MD;  [facility]: [facility];  Service: Ophthalmology;  Laterality: Right;  CDE: 7.89   CATARACT EXTRACTION W/PHACO Left 01/29/2020   Procedure: CATARACT EXTRACTION PHACO AND INTRAOCULAR LENS PLACEMENT (IOC) (CDE: 6.33);  Surgeon: Baruch Goldmann, MD;  [facility]: [facility];  Service: Ophthalmology;  Laterality: Left;   CHOLECYSTECTOMY  2007   COLON SURGERY  2008   [facility] at [facility]   COLONOSCOPY     Done at [facility]   ESOPHAGOGASTRODUODENOSCOPY N/A 08/21/2018   Procedure: ESOPHAGOGASTRODUODENOSCOPY (EGD);  Surgeon: Rogene Houston, MD;  [facility];  Service: Endoscopy;  Laterality: N/A;   ESOPHAGOGASTRODUODENOSCOPY (EGD) WITH PROPOFOL N/A 12/16/2019   Procedure: ESOPHAGOGASTRODUODENOSCOPY (EGD) WITH  PROPOFOL;  Surgeon: Rogene Houston, MD;  [facility];  Service: Endoscopy;  Laterality: N/A;   ESOPHAGOGASTRODUODENOSCOPY (EGD) WITH PROPOFOL N/A 12/20/2021   Procedure: ESOPHAGOGASTRODUODENOSCOPY (EGD) WITH PROPOFOL;  Surgeon: Rogene Houston, MD;  [facility];  Service: Endoscopy;  Laterality: N/A;  9:05   ESOPHAGOGASTRODUODENOSCOPY (EGD) WITH PROPOFOL N/A 02/14/2022   Procedure: ESOPHAGOGASTRODUODENOSCOPY (EGD) WITH PROPOFOL;  Surgeon: Rogene Houston, MD;  [facility];  Service: Endoscopy;  Laterality: N/A;  730   ESOPHAGOGASTRODUODENOSCOPY (EGD) WITH PROPOFOL N/A 06/04/2022   Procedure: ESOPHAGOGASTRODUODENOSCOPY (EGD) WITH PROPOFOL;  Surgeon: Harvel Quale, MD;  [facility];  Service: Gastroenterology;  Laterality: N/A;  145   EYE SURGERY     lasix   FLEXIBLE SIGMOIDOSCOPY N/A 10/20/2015   Procedure: FLEXIBLE SIGMOIDOSCOPY;  Surgeon: Rogene Houston, MD;  [facility];  Service: Endoscopy;  Laterality: N/A;  69 - Dr Laural Golden has meeting until 1:00   FLEXIBLE SIGMOIDOSCOPY N/A 07/11/2016   Procedure: FLEXIBLE SIGMOIDOSCOPY;  Surgeon: Rogene Houston, MD;  [facility];  Service: Endoscopy;  Laterality: N/A;  [place] N/A 08/09/2017   Procedure: FLEXIBLE SIGMOIDOSCOPY;  Surgeon: Rogene Houston, MD;  [facility];  Service: Endoscopy;  Laterality: N/A;  1:00   FLEXIBLE SIGMOIDOSCOPY N/A 08/21/2018   Procedure: FLEXIBLE SIGMOIDOSCOPY;  Surgeon: Rogene Houston, MD;  [facility];  Service: Endoscopy;  Laterality: N/A;   FLEXIBLE SIGMOIDOSCOPY N/A 12/16/2019   Procedure: FLEXIBLE SIGMOIDOSCOPY wirh Propofol;  Surgeon: Rogene Houston, MD;  [facility];  Service: Endoscopy;  Laterality: N/A;  7:30   FLEXIBLE SIGMOIDOSCOPY N/A 02/15/2021   Procedure: FLEXIBLE SIGMOIDOSCOPY WITH PROPOFOL;  Surgeon: Rogene Houston, MD;  [facility];  Service:  Endoscopy;  Laterality: N/A;  am   FLEXIBLE SIGMOIDOSCOPY N/A 12/20/2021   Procedure: FLEXIBLE SIGMOIDOSCOPY;  Surgeon: Rogene Houston, MD;  [facility];  Service: Endoscopy;  Laterality: N/A;   FRACTURE SURGERY     right wrist metal plate   GI RADIOFREQUENCY ABLATION N/A 02/14/2022   Procedure: GI RADIOFREQUENCY ABLATION;  Surgeon: Rogene Houston,  MD;  [facility];  Service: Endoscopy;  Laterality: N/A;   HOT HEMOSTASIS N/A 12/20/2021   Procedure: HOT HEMOSTASIS (ARGON PLASMA COAGULATION/BICAP);  Surgeon: Rogene Houston, MD;  [facility];  Service: Endoscopy;  Laterality: N/A;   HOT HEMOSTASIS  06/04/2022   Procedure: HOT HEMOSTASIS (ARGON PLASMA COAGULATION/BICAP);  Surgeon: Montez Morita, Quillian Quince, MD;  [facility];  Service: Gastroenterology;;   LIVER TRANSPLANT  11/19/2013   POLYPECTOMY  08/09/2017   Procedure: POLYPECTOMY;  Surgeon: Rogene Houston, MD;  [facility];  Service: Endoscopy;;  colon small bowel   POLYPECTOMY N/A 02/14/2022   Procedure: POLYPECTOMY;  Surgeon: Rogene Houston, MD;  [facility];  Service: Endoscopy;  Laterality: N/A;   REVERSE SHOULDER ARTHROPLASTY Left 07/17/2018   Procedure: LEFT REVERSE SHOULDER ARTHROPLASTY;  Surgeon: Justice Britain, MD;  Location: [place];  Service: Orthopedics;  Laterality: Left;  110mn   REVERSE SHOULDER ARTHROPLASTY Right 07/20/2021   Procedure: REVERSE SHOULDER ARTHROPLASTY;  Surgeon: SJustice Britain MD;  [facility];  Service: Orthopedics;  Laterality: Right;   SHOULDER CLOSED REDUCTION Left 09/27/2019   Procedure: CLOSED REDUCTION SHOULDER;  Surgeon: OParalee Cancel MD;  [facility];  Service: Orthopedics;  Laterality: Left;   SPLENECTOMY  2006   TOTAL SHOULDER REVISION Left 11/12/2019   Procedure: Revision Left Reverse Shoulder Arthroplasty with poly exchange SDD;  Surgeon: SJustice Britain MD;  [facility];  Service: Orthopedics;  Laterality: Left;   1225m -SDDC   TYMPANOSTOMY TUBE PLACEMENT     UPPER GASTROINTESTINAL ENDOSCOPY     [facility]  Problem Relation Age of Onset   Prostate cancer Father    Colon cancer Father    Colon cancer Sister    Lung cancer Sister    Healthy Son    Alcohol abuse Brother    Allergic rhinitis Neg Hx    Asthma Neg Hx    Eczema Neg Hx    Urticaria Neg Hx    Social History   Socioeconomic History   Marital status: Married    Spouse name: Johnny   Number of children: 1   Years of education: 12   Highest education level: High school graduate  Occupational History   Occupation: Disability    Employer: [employer]    Comment: AdMultimedia programmerTobacco Use   Smoking status: Never   Smokeless tobacco: Never  Vaping Use   Vaping Use: Never used  Substance and Sexual Activity   Alcohol use: No    Alcohol/week: 0.0 standard drinks of alcohol   Drug use: No   Sexual activity: Yes    Birth control/protection: None  Other Topics Concern   Not on file  Social History Narrative   Patient lives in a two story home with her husband. She has an adult son. 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    Social Determinants of Health   Financial Resource Strain: Low Risk  (07/02/2022)   Overall Financial Resource Strain (CARDIA)    Difficulty of Paying Living Expenses: Not hard at all  Food Insecurity: No Food Insecurity (07/02/2022)   Hunger Vital Sign    Worried About Running Out of Food in the Last Year: Never true    Ran Out of Food in the Last Year: Never true  Transportation Needs: No Transportation Needs (07/02/2022)   PRAPARE - TrHydrologistMedical): No  Lack of Transportation (Non-Medical): No  Physical Activity: Insufficiently Active (07/02/2022)   Exercise Vital Sign    Days of Exercise per Week: 7 days    Minutes of Exercise per Session: 10 min  Stress: No Stress Concern Present (07/02/2022)   [place]    Feeling of Stress : Not at all  Social Connections: [place] (07/02/2022)   Social Connection and Isolation Panel [NHANES]    Frequency of Communication with Friends and Family: More than three times a week    Frequency of Social Gatherings with Friends and Family: More than three times a week    Attends Religious Services: More than 4 times per year    Active Member of [employer] or Organizations: Yes    Attends Music therapist: More than 4 times per year    Marital Status: Married    Tobacco Counseling Counseling given: Not Answered   Clinical Intake:  Pre-visit preparation completed: Yes  Pain : No/denies pain     BMI - recorded: 24.55 Nutritional Status: BMI of 19-24  Normal Nutritional Risks: None Diabetes: No  How often do you need to have someone help you when you read instructions, pamphlets, or other written materials from your doctor or pharmacy?: 1 - Never  Diabetic? no  Interpreter Needed?: No  Information entered by :: Magdalene Tardiff, LPN   Activities of Daily Living    07/02/2022    9:08 AM 06/01/2022   10:51 AM  In your present state of health, do you have any difficulty performing the following activities:  Hearing? 0   Vision? 0   Difficulty concentrating or making decisions? 0   Walking or climbing stairs? 0   Dressing or bathing? 0   Doing errands, shopping? 0 0  Preparing Food and eating ? N   Using the Toilet? N   In the past six months, have you accidently leaked urine? N   Do you have problems with loss of bowel control? Y   Comment she does not have a colon   Managing your Medications? N   Managing your Finances? N   Housekeeping or managing your Housekeeping? N     Patient Care Team: [name], FNP as PCP - General (Family Medicine) Satira Sark, MD as PCP - Cardiology (Cardiology) Eustace Moore, MD as Consulting Physician  (Neurosurgery) [name], PA-C as Physician Assistant (Dermatology) Lavonna Monarch, MD as Consulting Physician (Dermatology) Weldon Inches, MD as Referring Physician (Gastroenterology) Rogene Houston, MD as Consulting Physician (Gastroenterology) Justice Britain, MD as Consulting Physician (Orthopedic Surgery)  Indicate any recent Medical Services you may have received from other than [facility] providers in the past year (date may be approximate).     Assessment:   This is a routine wellness examination for Kaena.  Hearing/Vision screen Hearing Screening - Comments:: Denies hearing difficulties   Vision Screening - Comments:: Denies vision difficulties - up to date with routine eye exams with [facility]  Dietary issues and exercise activities discussed: Current Exercise Habits: The patient does not participate in regular exercise at present, Exercise limited by: None identified   Goals Addressed             This Visit's Progress    Exercise 150 min/wk Moderate Activity   Not on track    Prevent falls   On track      Depression Screen    07/02/2022  9:07 AM 06/08/2022   11:11 AM 07/06/2021   10:56 AM 06/29/2021    9:12 AM 01/06/2021    8:43 AM 10/10/2020   11:27 AM 06/28/2020   11:01 AM  PHQ 2/9 Scores  PHQ - 2 Score 0 0 0 0 0 0 0  PHQ- 9 Score 1 1 0        Fall Risk    07/02/2022    9:05 AM 01/12/2022    3:22 PM 06/29/2021    9:14 AM 01/06/2021    8:43 AM 10/10/2020   11:27 AM  [place] in the past year? 0 0 0 0 0  Number falls in past yr: 0  0    Injury with Fall? 0  0    Risk for fall due to : No Fall Risks  Impaired vision;Medication side effect    Follow up Falls prevention discussed  Falls prevention discussed      FALL RISK PREVENTION PERTAINING TO THE HOME:  Any stairs in or around the home? Yes  If so, are there any without handrails? No  Home free of loose throw rugs in walkways, pet beds, electrical cords, etc? Yes  Adequate  lighting in your home to reduce risk of falls? Yes   ASSISTIVE DEVICES UTILIZED TO PREVENT FALLS:  Life alert? No  Use of a cane, walker or w/c? No  Grab bars in the bathroom? No  Shower chair or bench in shower? Yes  Elevated toilet seat or a handicapped toilet? No   TIMED UP AND GO:  Was the test performed? No . Telephonic visit  Cognitive Function:    12/12/2017   10:29 AM  MMSE - Mini Mental State Exam  Orientation to time 5  Orientation to Place 5  Registration 3  Attention/ Calculation 5  Recall 3  Language- name 2 objects 2  Language- repeat 1  Language- follow 3 step command 3  Language- read & follow direction 1  Write a sentence 1  Copy design 0  Total score 29        07/02/2022    9:09 AM 06/29/2021    9:17 AM 06/27/2020    9:32 AM 06/23/2019    8:38 AM  6CIT Screen  What Year? 0 points 0 points 0 points 0 points  What month? 0 points 0 points 0 points 0 points  What time? 0 points 0 points 0 points 0 points  Count back from 20 0 points 0 points 2 points 0 points  Months in reverse 0 points 0 points 2 points 0 points  Repeat phrase 2 points 4 points 0 points 2 points  Total Score 2 points 4 points 4 points 2 points    Immunizations Immunization History  Administered Date(s) Administered   Fluad Quad(high Dose 65+) 08/21/2019, 08/25/2020, 08/23/2021   Hepatitis A, Adult 06/28/2020, 01/02/2021   Hepatitis B, adult 02/28/2004, 03/27/2004, 08/21/2004   HiB (PRP-T) 01/15/2003   Influenza Split 08/06/2011, 09/18/2012, 09/13/2015   Influenza, High Dose Seasonal PF 07/18/2018, 08/05/2019   Influenza,inj,Quad PF,6+ Mos 08/07/2010, 07/31/2013, 08/16/2014, 09/01/2015   Influenza-Unspecified 07/13/2013, 09/01/2015, 08/27/2016, 07/26/2017   Meningococcal Polysaccharide 01/15/2003   Moderna Sars-Covid-2 Vaccination 01/07/2020, 02/05/2020, 07/13/2020   Pneumococcal Conjugate-13 09/01/2015, 07/26/2017   Pneumococcal Polysaccharide-23 01/15/2003, 08/06/2011,  07/13/2017   Pneumococcal-Unspecified 07/13/2012   Tdap 08/28/2016, 03/31/2018, 07/01/2019   Zoster Recombinat (Shingrix) 04/21/2018, 06/23/2018    TDAP status: Up to date  Flu Vaccine status: Up to date  Pneumococcal vaccine  status: Up to date  Covid-19 vaccine status: Completed vaccines  Qualifies for Shingles Vaccine? Yes   Zostavax completed Yes   Shingrix Completed?: Yes  Screening Tests Health Maintenance  Topic Date Due   COVID-19 Vaccine (4 - Moderna risk series) 09/07/2020   INFLUENZA VACCINE  06/12/2022   DEXA SCAN  08/26/2023   MAMMOGRAM  03/01/2024   COLONOSCOPY (Pts 45-75yr Insurance coverage will need to be confirmed)  08/12/2024   COLON CANCER SCREENING 5 YEAR SIGMOIDOSCOPY  12/20/2026   TETANUS/TDAP  06/30/2029   Pneumonia Vaccine 71 Years old  Completed   Hepatitis C Screening  Completed   Zoster Vaccines- Shingrix  Completed   HPV VACCINES  Aged Out    Health Maintenance  Health Maintenance Due  Topic Date Due   COVID-19 Vaccine (4 - Moderna risk series) 09/07/2020   INFLUENZA VACCINE  06/12/2022    Colorectal cancer screening: Type of screening: Sigmoidoscopy. Completed 12/20/2021. Repeat every 5 years  Mammogram status: Completed 03/01/2022. Repeat every year  Bone Density status: Completed 08/25/2021. Results reflect: Bone density results: OSTEOPOROSIS. Repeat every 2 years.  Lung Cancer Screening: (Low Dose CT Chest recommended if Age 11107-80years, 30 pack-year currently smoking OR have quit w/in 15years.) does not qualify.   Additional Screening:  Hepatitis C Screening: does qualify; Completed 08/31/2013  Vision Screening: Recommended annual ophthalmology exams for early detection of glaucoma and other disorders of the eye. Is the patient up to date with their annual eye exam?  Yes  Who is the provider or what is the name of the office in which the patient attends annual eye exams? [place] is not established with a provider,  would they like to be referred to a provider to establish care? No .   Dental Screening: Recommended annual dental exams for proper oral hygiene  Community Resource Referral / Chronic Care Management: CRR required this visit?  No   CCM required this visit?  No      Plan:     I have personally reviewed and noted the following in the patient's chart:   Medical and social history Use of alcohol, tobacco or illicit drugs  Current medications and supplements including opioid prescriptions. Patient is not currently taking opioid prescriptions. Functional ability and status Nutritional status Physical activity Advanced directives List of other physicians Hospitalizations, surgeries, and ER visits in previous 12 months Vitals Screenings to include cognitive, depression, and falls Referrals and appointments  In addition, I have reviewed and discussed with patient certain preventive protocols, quality metrics, and best practice recommendations. A written personalized care plan for preventive services as well as general preventive health recommendations were provided to patient.     ASandrea Hammond LPN   [id number]  Nurse Notes: She had liver transplant in 2015 - [facility] They have always gotten her Everolimus (her anti-rejection drug) covered so she has never paid for it - she is very worried about what will happen with that now - She is calling them today and will let u[place]ore[place]now what is said.

## 2022-07-05 NOTE — Progress Notes (Signed)
Virtual Visit via Telephone Note [facility]  I connected with Cassidy Barber  on 07/06/22 at  2:11 PM by telephone and verified that I am speaking with the correct person using two identifiers.  Location: Patient: Home Provider: [facility]   I discussed the limitations, risks, security and privacy concerns of performing an evaluation and management service by telephone and the availability of in person appointments. I also discussed with the patient that there may be a patient responsible charge related to this service. The patient expressed understanding and agreed to proceed.  REASON FOR VISIT: Iron deficiency anemia   CURRENT THERAPY: Intermittent IV iron  INTERVAL HISTORY: Cassidy Barber 71 year old female) is contacted today for follow-up of her iron deficiency anemia.  She was last evaluated via telemedicine visit by Tarri Abernethy PA-C on 04/05/2022.  At today's visit, she reports feeling well.   She continues to have fatigue with energy that "comes and goes," currently at 100%.  She continues to have ongoing loose, dark bowel movements which have been chronic ever since her colectomy.  This is unchanged, despite most recent EGD/APC treatment of GAVE on 06/04/2022.  No gross hematochezia, epistaxis, or other source of blood loss.  No pica, restless legs, chest pain, dyspnea on exertion, lightheadedness, or syncope.  She reports 100 % energy and 100% appetite.  She is maintaining a stable weight at this time.    OBSERVATIONS/OBJECTIVE: Review of Systems  Constitutional:  Negative for chills, diaphoresis, fever, malaise/fatigue and weight loss.  Respiratory:  Negative for cough and shortness of breath.   Cardiovascular:  Negative for chest pain and palpitations.  Gastrointestinal:  Negative for abdominal pain, blood in stool, melena, nausea and vomiting.  Neurological:  Negative for dizziness and headaches.     PHYSICAL EXAM (per limitations of  virtual telephone visit): The patient is alert and oriented x 3, exhibiting adequate mentation, good mood, and ability to speak in full sentences and execute sound judgement.   ASSESSMENT & PLAN: 1.  Iron deficiency anemia from occult GI blood loss: - Unable to tolerate oral iron. - Secondary to history of colon cancer s/p abdominal colectomy, malabsorption, and chronic blood loss from GAVE -  Most recent EGD (06/04/2022) showed GAVE without bleeding, treated with APC. - Most recent sigmoidoscopy (12/20/2021): Mucosal ulceration in distal rectum, end-to-end ileocolonic anastomosis characterized by healthy-appearing mucosa - SPEP negative in 2019 - She last received IV iron (Venofer 300 mg x 3) from 01/02/2022 through 01/19/2022 - Currently symptomatic with intermittent fatigue, which is chronic    - Dark stools ever since her colectomy.  Denies any epistaxis or rectal hemorrhage   - Most recent labs (06/19/2022): Hgb 12.1/MCV 88, ferritin 127, iron saturation 18% - PLAN: No indication for IV iron at this time. - Repeat labs and RTC in 3 months for follow-up  - same-day OFFICE VISIT - Continue follow-up with GI (Dr. Jenetta Downer) for management of any GI blood loss   2.  Liver Transplant: -Had this completed at [facility] in January 2015.  -She is on immunosuppression with everolimus.   3.  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 -She is followed by dermatology and has had several areas treated recently with topical creams.   4.  History of colon cancer: -Status post abdominal colectomy on 09/17/2011 with simultaneous total abdominal hysterectomy and bilateral salpingo-oophorectomy. -Found  to have Lynch syndrome. -Per recommendations she is to have an EGD every 2 to 3 years and annual colonoscopy for review of her residual colon tissue at the anastomosis. - Sigmoidoscopy  (02/15/2021): Two nonbleeding erosions at ileocolonic anastomosis, small external hemorrhoids    FOLLOW UP INSTRUCTIONS: Same-day labs and office visit in 3 months    I discussed the assessment and treatment plan with the patient. The patient was provided an opportunity to ask questions and all were answered. The patient agreed with the plan and demonstrated an understanding of the instructions.   The patient was advised to call back or seek an in-person evaluation if the symptoms worsen or if the condition fails to improve as anticipated.  I provided 12 minutes of non-face-to-face time during this encounter.   Harriett Rush, PA-C 07/06/22 4:49 PM

## 2022-07-06 ENCOUNTER — Inpatient Hospital Stay: Payer: Medicare Other | Attending: Physician Assistant | Admitting: Physician Assistant

## 2022-07-06 DIAGNOSIS — D500 Iron deficiency anemia secondary to blood loss (chronic): Secondary | ICD-10-CM

## 2022-07-09 ENCOUNTER — Ambulatory Visit (INDEPENDENT_AMBULATORY_CARE_PROVIDER_SITE_OTHER): Payer: Medicare Other | Admitting: Family Medicine

## 2022-07-09 ENCOUNTER — Encounter: Payer: Self-pay | Admitting: Family Medicine

## 2022-07-09 VITALS — BP 133/82 | HR 86 | Temp 98.3°F | Ht 64.0 in | Wt 144.1 lb

## 2022-07-09 DIAGNOSIS — J069 Acute upper respiratory infection, unspecified: Secondary | ICD-10-CM

## 2022-07-09 MED ORDER — HYDROCODONE BIT-HOMATROP MBR 5-1.5 MG/5ML PO SOLN: 5.0000 mL | Freq: Three times a day (TID) | ORAL | 0 refills | Status: DC | PRN

## 2022-07-09 NOTE — Progress Notes (Signed)
Acute Office Visit  Subjective:     Patient ID: Cassidy Barber, female    DOB: 1951/03/06, 71 y.o.   MRN: [id number]  Chief Complaint  Patient presents with   Cough    URI  This is a new problem. Episode onset: 3 days ago. The problem has been gradually worsening. There has been no fever. Associated symptoms include congestion, coughing, headaches, sinus pain and a sore throat. Pertinent negatives include no abdominal pain, chest pain, diarrhea, dysuria, ear pain, joint pain, joint swelling, nausea, neck pain, plugged ear sensation, rash, swollen glands, vomiting or wheezing. She has tried acetaminophen, antihistamine and decongestant for the symptoms. The treatment provided mild relief.   She had a negative home Covid test last night.   Review of Systems  Constitutional:  Negative for chills, diaphoresis, fever and malaise/fatigue.  HENT:  Positive for congestion, sinus pain and sore throat. Negative for ear discharge, ear pain and nosebleeds.   Respiratory:  Positive for cough. Negative for sputum production, shortness of breath, wheezing and stridor.   Cardiovascular:  Negative for chest pain.  Gastrointestinal:  Negative for abdominal pain, diarrhea, nausea and vomiting.  Genitourinary:  Negative for dysuria.  Musculoskeletal:  Negative for joint pain, myalgias and neck pain.  Skin:  Negative for rash.  Neurological:  Positive for headaches. Negative for weakness.        Objective:    BP 133/82   Pulse 86   Temp 98.3 F (36.8 C) (Temporal)   Ht '5\' 4"'$  (1.626 m)   Wt 144 lb 2 oz (65.4 kg)   SpO2 96%   BMI 24.74 kg/m    Physical Exam Vitals and nursing note reviewed.  Constitutional:      General: She is not in acute distress.    Appearance: She is not ill-appearing, toxic-appearing or diaphoretic.  HENT:     Head: Normocephalic and atraumatic.     Right Ear: Tympanic membrane, ear canal and external ear normal.     Left Ear: Tympanic membrane, ear canal and  external ear normal.     Nose: Congestion present.     Mouth/Throat:     Mouth: Mucous membranes are moist.     Pharynx: Posterior oropharyngeal erythema present. No oropharyngeal exudate.  Eyes:     General:        Right eye: No discharge.        Left eye: No discharge.     Pupils: Pupils are equal, round, and reactive to light.  Cardiovascular:     Rate and Rhythm: Normal rate and regular rhythm.     Heart sounds: Normal heart sounds. No murmur heard. Pulmonary:     Effort: Pulmonary effort is normal. No respiratory distress.     Breath sounds: Normal breath sounds.  Abdominal:     General: Bowel sounds are normal. There is no distension.     Palpations: Abdomen is soft.     Tenderness: There is no abdominal tenderness. There is no guarding or rebound.  Musculoskeletal:     Cervical back: Neck supple.     Right lower leg: No edema.     Left lower leg: No edema.  Lymphadenopathy:     Cervical: No cervical adenopathy.  Skin:    General: Skin is warm and dry.  Neurological:     General: No focal deficit present.     Mental Status: She is alert and oriented to person, place, and time.  Psychiatric:  Mood and Affect: Mood normal.        Behavior: Behavior normal.     No results found for any visits on 07/09/22.      Assessment & Plan:   Kajah was seen today for cough.  Diagnoses and all orders for this visit:  Viral URI with cough Covid/flu pending. Quarantine until result. Discussed symptomatic care and return precautions.  -     HYDROcodone bit-homatropine (HYCODAN) 5-1.5 MG/5ML syrup; Take 5 mLs by mouth every 8 (eight) hours as needed for cough. -     COVID-19, Flu A+B and RSV  The patient indicates understanding of these issues and agrees with the plan.  Gwenlyn Perking, FNP

## 2022-07-09 NOTE — Patient Instructions (Signed)

## 2022-07-10 LAB — COVID-19, FLU A+B AND RSV
Influenza A, NAA: NOT DETECTED
Influenza B, NAA: NOT DETECTED
RSV, NAA: NOT DETECTED
SARS-CoV-2, NAA: NOT DETECTED

## 2022-07-11 ENCOUNTER — Telehealth: Payer: Self-pay | Admitting: Family

## 2022-07-11 DIAGNOSIS — J069 Acute upper respiratory infection, unspecified: Secondary | ICD-10-CM

## 2022-07-11 NOTE — Telephone Encounter (Signed)
  Incoming Patient Call  07/11/2022  What symptoms do you have? Coughing and coughing up phlegm and fever  How long have you been sick? Since last Friday  Have you been seen for this problem? 8-28 with Tiffany  If your provider decides to give you a prescription, which pharmacy would you like for it to be sent to? [facility]   Patient informed that this information will be sent to the clinical staff for review and that they should receive a follow up call.

## 2022-07-11 NOTE — Telephone Encounter (Signed)
Please review and advise.

## 2022-07-12 ENCOUNTER — Encounter: Payer: Medicare Other | Admitting: Physician Assistant

## 2022-07-12 MED ORDER — AZITHROMYCIN 250 MG PO TABS: ORAL_TABLET | ORAL | 0 refills | Status: DC

## 2022-07-12 NOTE — Telephone Encounter (Signed)
Zpak sent in 

## 2022-07-12 NOTE — Telephone Encounter (Signed)
Patient aware.

## 2022-07-13 ENCOUNTER — Telehealth: Payer: Self-pay | Admitting: Family

## 2022-07-13 ENCOUNTER — Other Ambulatory Visit: Payer: Self-pay | Admitting: Family Medicine

## 2022-07-13 DIAGNOSIS — J069 Acute upper respiratory infection, unspecified: Secondary | ICD-10-CM

## 2022-07-13 NOTE — Telephone Encounter (Signed)
Refer to previous message - patient was mad aware a z pack was sent in.

## 2022-07-13 NOTE — Telephone Encounter (Signed)
I sent in a zpak yesterday when she called.

## 2022-07-13 NOTE — Telephone Encounter (Signed)
Patient aware she can not get a refill of the cough medication that was sent in on 8/28- she states she is no better from that visit- cough, sore throat, head congestion and loss of voice. Please advise

## 2022-07-13 NOTE — Telephone Encounter (Signed)
PCP = please decline as this is controlled  Just seen 8/28.

## 2022-07-24 ENCOUNTER — Other Ambulatory Visit: Payer: Self-pay | Admitting: Family

## 2022-07-24 DIAGNOSIS — M5432 Sciatica, left side: Secondary | ICD-10-CM

## 2022-07-24 NOTE — Telephone Encounter (Signed)
Last office visit 06/08/22 Last refill 06/08/22, #90, no refills

## 2022-07-25 DIAGNOSIS — Z944 Liver transplant status: Secondary | ICD-10-CM | POA: Diagnosis not present

## 2022-07-25 DIAGNOSIS — D849 Immunodeficiency, unspecified: Secondary | ICD-10-CM | POA: Diagnosis not present

## 2022-07-27 ENCOUNTER — Encounter: Payer: Self-pay | Admitting: Family

## 2022-07-27 ENCOUNTER — Telehealth (INDEPENDENT_AMBULATORY_CARE_PROVIDER_SITE_OTHER): Payer: Medicare Other | Admitting: Family

## 2022-07-27 DIAGNOSIS — J209 Acute bronchitis, unspecified: Secondary | ICD-10-CM | POA: Diagnosis not present

## 2022-07-27 MED ORDER — BENZONATATE 200 MG PO CAPS: 200.0000 mg | ORAL_CAPSULE | Freq: Three times a day (TID) | ORAL | 1 refills | Status: DC | PRN

## 2022-07-27 MED ORDER — PREDNISONE 10 MG (21) PO TBPK: ORAL_TABLET | ORAL | 0 refills | Status: DC

## 2022-07-27 MED ORDER — HYDROCODONE BIT-HOMATROP MBR 5-1.5 MG/5ML PO SOLN: ORAL | 0 refills | Status: DC

## 2022-07-27 NOTE — Patient Instructions (Signed)

## 2022-07-27 NOTE — Progress Notes (Signed)
Virtual Visit Consent   Cassidy Barber, you are scheduled for a virtual visit with a [facility] provider today. Just as with appointments in the office, your consent must be obtained to participate. Your consent will be active for this visit and any virtual visit you may have with one of our providers in the next 365 days. If you have a MyChart account, a copy of this consent can be sent to you electronically.  As this is a virtual visit, video technology does not allow for your provider to perform a traditional examination. This may limit your provider's ability to fully assess your condition. If your provider identifies any concerns that need to be evaluated in person or the need to arrange testing (such as labs, EKG, etc.), we will make arrangements to do so. Although advances in technology are sophisticated, we cannot ensure that it will always work on either your end or our end. If the connection with a video visit is poor, the visit may have to be switched to a telephone visit. With either a video or telephone visit, we are not always able to ensure that we have a secure connection.  By engaging in this virtual visit, you consent to the provision of healthcare and authorize for your insurance to be billed (if applicable) for the services provided during this visit. Depending on your insurance coverage, you may receive a charge related to this service.  I need to obtain your verbal consent now. Are you willing to proceed with your visit today? Cassidy Barber has provided verbal consent on 07/27/2022 for a virtual visit (video or telephone). Cassidy Dun, FNP  Date: 07/27/2022 10:10 AM  Virtual Visit via Video Note   I, Cassidy Barber, connected with  Cassidy Barber  ([id number], [birth year]) on 07/27/22 at 10:10 AM EDT by a video-enabled telemedicine application and verified that I am speaking with the correct person using two identifiers.  Location: Patient: Virtual Visit Location Patient:  Home Provider: Virtual Visit Location Provider: Office/Clinic   I discussed the limitations of evaluation and management by telemedicine and the availability of in person appointments. The patient expressed understanding and agreed to proceed.    History of Present Illness: Cassidy Barber is a 71 y.o. who identifies as a female who was assigned female at birth, and is being seen today for cough that started two weeks ago. Has been taking cough medications. COVID, FLU, and RSV negative.   HPI: Cough This is a new problem. The current episode started 1 to 4 weeks ago. The problem has been gradually worsening. The problem occurs every few minutes. The cough is Productive of sputum. Associated symptoms include nasal congestion and rhinorrhea. Pertinent negatives include no chills, ear congestion, ear pain, fever, headaches, shortness of breath or wheezing. She has tried rest for the symptoms. The treatment provided mild relief.    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   AKI (acute kidney injury) ([place]) 10/05/2019   Anterior dislocation of left shoulder 09/26/2019   Hx of colonic polyps 09/04/2019   Chest pain 09/04/2019   Closed fracture of shaft of right ulna 07/08/2019   Fracture of shaft of ulna 07/03/2019   Benzodiazepine dependence ([place]) 04/13/2019   Controlled substance agreement signed 04/13/2019  S/p reverse total shoulder arthroplasty 07/17/2018   History of colon cancer 06/12/2018   History of colonic polyps 06/12/2018   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   Gastroesophageal reflux disease 06/12/2018   Rotator cuff tear arthropathy 05/28/2018   Hypertension 04/08/2018   Chronic diarrhea 06/26/2017   Iron deficiency anemia 06/26/2017    GAD (generalized anxiety disorder) 08/28/2016   Vitamin D deficiency 08/28/2016   Insomnia 08/28/2016   Immunosuppression ([place]) 12/02/2015   Seizures ([place]) 09/27/2015   Hx of liver transplant ([place]) 12/08/2014   Colon cancer ([place]) 12/08/2014   Portal vein thrombosis 11/19/2013   Hepatic encephalopathy ([place]) 10/26/2013   Personal history of colonic polyps 12/27/2012   Hypothyroidism 03/24/2012   Ventral hernia, recurrent 03/24/2012   Hernia 09/04/2011   Other secondary pulmonary hypertension ([place]) 08/16/2011    Allergies:  Allergies  Allergen Reactions   Ciprofloxacin Itching   Codeine Nausea Only   Medications:  Current Outpatient Medications:    acetaminophen (TYLENOL) 500 MG tablet, Take 1,000 mg by mouth every 6 (six) hours as needed (for pain.). , Disp: , Rfl:    alendronate (FOSAMAX) 70 MG tablet, TAKE 1 TABLET EVERY WEEK (Patient taking differently: Take 70 mg by mouth every Thursday. TAKE 1 TABLET EVERY WEEK), Disp: 12 tablet, Rfl: 2   ALPRAZolam (XANAX) 0.5 MG tablet, Take 1 tablet (0.5 mg total) by mouth at bedtime., Disp: 90 tablet, Rfl: 1   Ascorbic Acid (VITAMIN C) 500 MG CHEW, Chew 1,000 mg by mouth daily., Disp: , Rfl:    benzonatate (TESSALON) 200 MG capsule, Take 1 capsule (200 mg total) by mouth 3 (three) times daily as needed., Disp: 90 capsule, Rfl: 1   Biotin 1000 MCG tablet, Take 1,000 mcg by mouth in the morning and at bedtime., Disp: , Rfl:    Biotin w/ Vitamins C & E (HAIR SKIN & NAILS GUMMIES PO), Take 1 tablet by mouth in the morning and at bedtime., Disp: , Rfl:    Calcium Citrate-Vitamin D (CALCIUM CITRATE + D3 PO), Take 1 tablet by mouth 2 (two) times daily., Disp: , Rfl:    cetirizine (ZYRTEC) 10 MG tablet, Take 1 tablet (10 mg total) by mouth daily as needed for allergies., Disp: 90 tablet, Rfl: 3   Clobetasol Prop Emollient Base (CLOBETASOL PROPIONATE E) 0.05 % emollient cream, Apply 1 application. topically 2 (two) times daily., Disp: 180 g, Rfl: 1    clotrimazole-betamethasone (LOTRISONE) cream, Apply 1 application topically 2 (two) times daily. (Patient taking differently: Apply 1 application  topically daily as needed (Rash).), Disp: 30 g, Rfl: 0   Cyanocobalamin (VITAMIN B-12) 5000 MCG TBDP, Take 5,000 mcg by mouth 2 (two) times a week., Disp: , Rfl:    cyclobenzaprine (FLEXERIL) 10 MG tablet, Take 1 tablet (10 mg total) by mouth 3 (three) times daily as needed for muscle spasms., Disp: 30 tablet, Rfl: 1   famotidine (PEPCID) 20 MG tablet, Take 1 tablet (20 mg total) by mouth 2 (two) times daily., Disp: 60 tablet, Rfl: 5   fluorouracil (EFUDEX) 5 % cream, Apply 1 application topically daily as needed (cancer spots). , Disp: , Rfl: 0   gabapentin (NEURONTIN) 100 MG capsule, TAKE ONE CAPSULE BY MOUTH DAILY, Disp: 90 capsule, Rfl: 0   HYDROcodone bit-homatropine (HYCODAN) 5-1.5 MG/5ML syrup, TAKE 1 TEASPOONFUL BY MOUTH EVERY 8 HOURS AS NEEDED FOR COUGH, Disp: 473 mL, Rfl: 0   levothyroxine (SYNTHROID) 112 MCG tablet, Take 1 tablet (112 mcg total) by mouth daily., Disp: 90 tablet,  Rfl: 4   losartan (COZAAR) 25 MG tablet, Take 1 tablet (25 mg total) by mouth daily., Disp: 90 tablet, Rfl: 4   metoprolol succinate (TOPROL XL) 25 MG 24 hr tablet, Take 1 tablet (25 mg total) by mouth daily., Disp: 90 tablet, Rfl: 4   Multiple Vitamins-Minerals (MULTIVITAMIN WITH MINERALS) tablet, Take 1 tablet by mouth daily., Disp: , Rfl:    mupirocin ointment (BACTROBAN) 2 %, Apply 1 application topically daily as needed (Cancer spots)., Disp: 22 g, Rfl: 3   NIACINAMIDE-ZINC-FOLIC ACID PO, Take 2 tablets by mouth 2 (two) times daily., Disp: , Rfl:    pantoprazole (PROTONIX) 40 MG tablet, Take 1 tablet (40 mg total) by mouth 2 (two) times daily., Disp: 180 tablet, Rfl: 0   predniSONE (STERAPRED UNI-PAK 21 TAB) 10 MG (21) TBPK tablet, Use as directed, Disp: 21 tablet, Rfl: 0   silver sulfADIAZINE (SILVADENE) 1 % cream, Apply 1 Application topically daily. Apply to large  surface area once to twice daily (Patient taking differently: Apply 1 Application topically daily as needed (skin irritation). Apply to large surface area once to twice daily), Disp: 400 g, Rfl: 1   ursodiol (ACTIGALL) 250 MG tablet, Take 250 mg by mouth 2 (two) times daily. , Disp: , Rfl:    Vitamin D, Ergocalciferol, (DRISDOL) 1.25 MG (50000 UNIT) CAPS capsule, TAKE 1 CAPSULE BY MOUTH ONCE A WEEK (Patient taking differently: Take 50,000 Units by mouth every Tuesday.), Disp: 12 capsule, Rfl: 3   vitamin E 180 MG (400 UNITS) capsule, Take 400 Units by mouth daily., Disp: , Rfl:    ZORTRESS 0.5 MG TABS, Take 4 tablets (2 mg total) by mouth 2 (two) times daily., Disp: 180 tablet, Rfl: 2  Observations/Objective: Patient is well-developed, well-nourished in no acute distress.  Resting comfortably  at home.  Head is normocephalic, atraumatic.  No labored breathing.  Speech is clear and coherent with logical content.  Patient is alert and oriented at baseline.  Nasal congestion and cough  Assessment and Plan: 1. Acute bronchitis, unspecified organism - predniSONE (STERAPRED UNI-PAK 21 TAB) 10 MG (21) TBPK tablet; Use as directed  Dispense: 21 tablet; Refill: 0 - HYDROcodone bit-homatropine (HYCODAN) 5-1.5 MG/5ML syrup; TAKE 1 TEASPOONFUL BY MOUTH EVERY 8 HOURS AS NEEDED FOR COUGH  Dispense: 473 mL; Refill: 0 - benzonatate (TESSALON) 200 MG capsule; Take 1 capsule (200 mg total) by mouth 3 (three) times daily as needed.  Dispense: 90 capsule; Refill: 1  - Take meds as prescribed - Use a cool mist humidifier  -Use saline nose sprays frequently -Force fluids -For any cough or congestion  Use plain Mucinex- regular strength or max strength is fine -For fever or aces or pains- take tylenol or ibuprofen. -Throat lozenges if help -Follow up if symptoms worsen or do not improve   Follow Up Instructions: I discussed the assessment and treatment plan with the patient. The patient was provided an  opportunity to ask questions and all were answered. The patient agreed with the plan and demonstrated an understanding of the instructions.  A copy of instructions were sent to the patient via MyChart unless otherwise noted below.    The patient was advised to call back or seek an in-person evaluation if the symptoms worsen or if the condition fails to improve as anticipated.  Time:  I spent 7 minutes with the patient via telehealth technology discussing the above problems/concerns.    Cassidy Dun, FNP

## 2022-08-01 DIAGNOSIS — C44729 Squamous cell carcinoma of skin of left lower limb, including hip: Secondary | ICD-10-CM | POA: Diagnosis not present

## 2022-08-13 DIAGNOSIS — D849 Immunodeficiency, unspecified: Secondary | ICD-10-CM | POA: Diagnosis not present

## 2022-08-13 DIAGNOSIS — Z944 Liver transplant status: Secondary | ICD-10-CM | POA: Diagnosis not present

## 2022-08-16 DIAGNOSIS — Z4801 Encounter for change or removal of surgical wound dressing: Secondary | ICD-10-CM | POA: Diagnosis not present

## 2022-08-16 DIAGNOSIS — D485 Neoplasm of uncertain behavior of skin: Secondary | ICD-10-CM | POA: Diagnosis not present

## 2022-08-20 DIAGNOSIS — Z944 Liver transplant status: Secondary | ICD-10-CM | POA: Diagnosis not present

## 2022-08-20 DIAGNOSIS — R748 Abnormal levels of other serum enzymes: Secondary | ICD-10-CM | POA: Diagnosis not present

## 2022-08-20 DIAGNOSIS — K862 Cyst of pancreas: Secondary | ICD-10-CM | POA: Diagnosis not present

## 2022-08-21 DIAGNOSIS — D485 Neoplasm of uncertain behavior of skin: Secondary | ICD-10-CM | POA: Diagnosis not present

## 2022-08-21 DIAGNOSIS — D0471 Carcinoma in situ of skin of right lower limb, including hip: Secondary | ICD-10-CM | POA: Diagnosis not present

## 2022-08-21 DIAGNOSIS — C44722 Squamous cell carcinoma of skin of right lower limb, including hip: Secondary | ICD-10-CM | POA: Diagnosis not present

## 2022-08-21 DIAGNOSIS — C44729 Squamous cell carcinoma of skin of left lower limb, including hip: Secondary | ICD-10-CM | POA: Diagnosis not present

## 2022-08-21 DIAGNOSIS — L570 Actinic keratosis: Secondary | ICD-10-CM | POA: Diagnosis not present

## 2022-08-29 DIAGNOSIS — Z944 Liver transplant status: Secondary | ICD-10-CM | POA: Diagnosis not present

## 2022-09-03 ENCOUNTER — Ambulatory Visit (INDEPENDENT_AMBULATORY_CARE_PROVIDER_SITE_OTHER): Payer: Medicare Other | Admitting: Gastroenterology

## 2022-09-04 DIAGNOSIS — D485 Neoplasm of uncertain behavior of skin: Secondary | ICD-10-CM | POA: Diagnosis not present

## 2022-09-04 DIAGNOSIS — C44629 Squamous cell carcinoma of skin of left upper limb, including shoulder: Secondary | ICD-10-CM | POA: Diagnosis not present

## 2022-09-05 DIAGNOSIS — Z944 Liver transplant status: Secondary | ICD-10-CM | POA: Diagnosis not present

## 2022-09-05 DIAGNOSIS — D849 Immunodeficiency, unspecified: Secondary | ICD-10-CM | POA: Diagnosis not present

## 2022-09-11 DIAGNOSIS — C44629 Squamous cell carcinoma of skin of left upper limb, including shoulder: Secondary | ICD-10-CM | POA: Diagnosis not present

## 2022-09-11 DIAGNOSIS — D485 Neoplasm of uncertain behavior of skin: Secondary | ICD-10-CM | POA: Diagnosis not present

## 2022-09-11 DIAGNOSIS — C4492 Squamous cell carcinoma of skin, unspecified: Secondary | ICD-10-CM | POA: Diagnosis not present

## 2022-09-17 ENCOUNTER — Telehealth (INDEPENDENT_AMBULATORY_CARE_PROVIDER_SITE_OTHER): Payer: Medicare Other | Admitting: Family

## 2022-09-17 ENCOUNTER — Encounter: Payer: Self-pay | Admitting: Family

## 2022-09-17 DIAGNOSIS — Z944 Liver transplant status: Secondary | ICD-10-CM | POA: Diagnosis not present

## 2022-09-17 DIAGNOSIS — J019 Acute sinusitis, unspecified: Secondary | ICD-10-CM

## 2022-09-17 MED ORDER — AMOXICILLIN-POT CLAVULANATE 875-125 MG PO TABS: 1.0000 | ORAL_TABLET | Freq: Two times a day (BID) | ORAL | 0 refills | Status: DC

## 2022-09-17 NOTE — Progress Notes (Signed)
Virtual Visit Consent   Cassidy Barber, you are scheduled for a virtual visit with a [facility] provider today. Just as with appointments in the office, your consent must be obtained to participate. Your consent will be active for this visit and any virtual visit you may have with one of our providers in the next 365 days. If you have a MyChart account, a copy of this consent can be sent to you electronically.  As this is a virtual visit, video technology does not allow for your provider to perform a traditional examination. This may limit your provider's ability to fully assess your condition. If your provider identifies any concerns that need to be evaluated in person or the need to arrange testing (such as labs, EKG, etc.), we will make arrangements to do so. Although advances in technology are sophisticated, we cannot ensure that it will always work on either your end or our end. If the connection with a video visit is poor, the visit may have to be switched to a telephone visit. With either a video or telephone visit, we are not always able to ensure that we have a secure connection.  By engaging in this virtual visit, you consent to the provision of healthcare and authorize for your insurance to be billed (if applicable) for the services provided during this visit. Depending on your insurance coverage, you may receive a charge related to this service.  I need to obtain your verbal consent now. Are you willing to proceed with your visit today? Cassidy Barber has provided verbal consent on 09/17/2022 for a virtual visit (video or telephone). Cassidy Dun, FNP  Date: 09/17/2022 1:12 PM  Virtual Visit via Video Note   I, Cassidy Barber, connected with  Cassidy Barber  ([id number], June 01, 1951) on 09/17/22 at  5:15 PM EST by a video-enabled telemedicine application and verified that I am speaking with the correct person using two identifiers.  Location: Patient: Virtual Visit Location Patient:  Home Provider: Virtual Visit Location Provider: Office/Clinic   I discussed the limitations of evaluation and management by telemedicine and the availability of in person appointments. The patient expressed understanding and agreed to proceed.    History of Present Illness: Cassidy Barber is a 71 y.o. who identifies as a female who was assigned female at birth, and is being seen today for cough and congestion. She did a home COVID test that was negative.   HPI: Sinusitis This is a new problem. The current episode started 1 to 4 weeks ago. The problem has been gradually worsening since onset. There has been no fever. Her pain is at a severity of 9/10. The pain is moderate. Associated symptoms include congestion, coughing, ear pain, sinus pressure and a sore throat. Pertinent negatives include no chills, headaches, hoarse voice or shortness of breath. Past treatments include oral decongestants. The treatment provided mild relief.    Problems:  Patient Active Problem List   Diagnosis Date Noted   Purpura senilis ([place]) 06/08/2022   GAVE (gastric antral vascular ectasia) 05/21/2022   Positive urine culture 09/05/2021   Elevated alkaline phosphatase level 02/02/2021   S/P reverse total shoulder arthroplasty, left 11/12/2019   AKI (acute kidney injury) ([place]) 10/05/2019   Anterior dislocation of left shoulder 09/26/2019   Hx of colonic polyps 09/04/2019   Chest pain 09/04/2019   Closed fracture of shaft of right ulna 07/08/2019   Fracture of shaft of ulna 07/03/2019   Benzodiazepine dependence ([place]) 04/13/2019   Controlled  substance agreement signed 04/13/2019   S/p reverse total shoulder arthroplasty 07/17/2018   History of colon cancer 06/12/2018   History of colonic polyps 06/12/2018   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   Gastroesophageal reflux disease 06/12/2018   Rotator cuff tear arthropathy 05/28/2018   Hypertension 04/08/2018   Chronic diarrhea 06/26/2017   Iron deficiency anemia 06/26/2017    GAD (generalized anxiety disorder) 08/28/2016   Vitamin D deficiency 08/28/2016   Insomnia 08/28/2016   Immunosuppression ([place]) 12/02/2015   Seizures ([place]) 09/27/2015   Hx of liver transplant ([place]) 12/08/2014   Colon cancer ([place]) 12/08/2014   Portal vein thrombosis 11/19/2013   Hepatic encephalopathy ([place]) 10/26/2013   Personal history of colonic polyps 12/27/2012   Hypothyroidism 03/24/2012   Ventral hernia, recurrent 03/24/2012   Hernia 09/04/2011   Other secondary pulmonary hypertension ([place]) 08/16/2011    Allergies:  Allergies  Allergen Reactions   Ciprofloxacin Itching   Codeine Nausea Only   Medications:  Current Outpatient Medications:    amoxicillin-clavulanate (AUGMENTIN) 875-125 MG tablet, Take 1 tablet by mouth 2 (two) times daily., Disp: 14 tablet, Rfl: 0   acetaminophen (TYLENOL) 500 MG tablet, Take 1,000 mg by mouth every 6 (six) hours as needed (for pain.). , Disp: , Rfl:    alendronate (FOSAMAX) 70 MG tablet, TAKE 1 TABLET EVERY WEEK (Patient taking differently: Take 70 mg by mouth every Thursday. TAKE 1 TABLET EVERY WEEK), Disp: 12 tablet, Rfl: 2   ALPRAZolam (XANAX) 0.5 MG tablet, Take 1 tablet (0.5 mg total) by mouth at bedtime., Disp: 90 tablet, Rfl: 1   Ascorbic Acid (VITAMIN C) 500 MG CHEW, Chew 1,000 mg by mouth daily., Disp: , Rfl:    benzonatate (TESSALON) 200 MG capsule, Take 1 capsule (200 mg total) by mouth 3 (three) times daily as needed., Disp: 90 capsule, Rfl: 1   Biotin 1000 MCG tablet, Take 1,000 mcg by mouth in the morning and at bedtime., Disp: , Rfl:    Biotin w/ Vitamins C & E (HAIR SKIN & NAILS GUMMIES PO), Take 1 tablet by mouth in the morning and at bedtime., Disp: , Rfl:    Calcium Citrate-Vitamin D (CALCIUM CITRATE + D3 PO), Take 1 tablet by mouth 2 (two) times daily., Disp: , Rfl:    cetirizine (ZYRTEC) 10 MG tablet, Take 1 tablet (10 mg total) by mouth daily as needed for allergies., Disp: 90 tablet, Rfl: 3   Clobetasol Prop Emollient  Base (CLOBETASOL PROPIONATE E) 0.05 % emollient cream, Apply 1 application. topically 2 (two) times daily., Disp: 180 g, Rfl: 1   clotrimazole-betamethasone (LOTRISONE) cream, Apply 1 application topically 2 (two) times daily. (Patient taking differently: Apply 1 application  topically daily as needed (Rash).), Disp: 30 g, Rfl: 0   Cyanocobalamin (VITAMIN B-12) 5000 MCG TBDP, Take 5,000 mcg by mouth 2 (two) times a week., Disp: , Rfl:    cyclobenzaprine (FLEXERIL) 10 MG tablet, Take 1 tablet (10 mg total) by mouth 3 (three) times daily as needed for muscle spasms., Disp: 30 tablet, Rfl: 1   famotidine (PEPCID) 20 MG tablet, Take 1 tablet (20 mg total) by mouth 2 (two) times daily., Disp: 60 tablet, Rfl: 5   fluorouracil (EFUDEX) 5 % cream, Apply 1 application topically daily as needed (cancer spots). , Disp: , Rfl: 0   gabapentin (NEURONTIN) 100 MG capsule, TAKE ONE CAPSULE BY MOUTH DAILY, Disp: 90 capsule, Rfl: 0   HYDROcodone bit-homatropine (HYCODAN) 5-1.5 MG/5ML syrup, TAKE 1 TEASPOONFUL BY MOUTH EVERY 8 HOURS AS  NEEDED FOR COUGH, Disp: 473 mL, Rfl: 0   levothyroxine (SYNTHROID) 112 MCG tablet, Take 1 tablet (112 mcg total) by mouth daily., Disp: 90 tablet, Rfl: 4   losartan (COZAAR) 25 MG tablet, Take 1 tablet (25 mg total) by mouth daily., Disp: 90 tablet, Rfl: 4   metoprolol succinate (TOPROL XL) 25 MG 24 hr tablet, Take 1 tablet (25 mg total) by mouth daily., Disp: 90 tablet, Rfl: 4   Multiple Vitamins-Minerals (MULTIVITAMIN WITH MINERALS) tablet, Take 1 tablet by mouth daily., Disp: , Rfl:    mupirocin ointment (BACTROBAN) 2 %, Apply 1 application topically daily as needed (Cancer spots)., Disp: 22 g, Rfl: 3   NIACINAMIDE-ZINC-FOLIC ACID PO, Take 2 tablets by mouth 2 (two) times daily., Disp: , Rfl:    pantoprazole (PROTONIX) 40 MG tablet, Take 1 tablet (40 mg total) by mouth 2 (two) times daily., Disp: 180 tablet, Rfl: 0   predniSONE (STERAPRED UNI-PAK 21 TAB) 10 MG (21) TBPK tablet, Use as  directed, Disp: 21 tablet, Rfl: 0   silver sulfADIAZINE (SILVADENE) 1 % cream, Apply 1 Application topically daily. Apply to large surface area once to twice daily (Patient taking differently: Apply 1 Application topically daily as needed (skin irritation). Apply to large surface area once to twice daily), Disp: 400 g, Rfl: 1   ursodiol (ACTIGALL) 250 MG tablet, Take 250 mg by mouth 2 (two) times daily. , Disp: , Rfl:    Vitamin D, Ergocalciferol, (DRISDOL) 1.25 MG (50000 UNIT) CAPS capsule, TAKE 1 CAPSULE BY MOUTH ONCE A WEEK (Patient taking differently: Take 50,000 Units by mouth every Tuesday.), Disp: 12 capsule, Rfl: 3   vitamin E 180 MG (400 UNITS) capsule, Take 400 Units by mouth daily., Disp: , Rfl:    ZORTRESS 0.5 MG TABS, Take 4 tablets (2 mg total) by mouth 2 (two) times daily., Disp: 180 tablet, Rfl: 2  Observations/Objective: Patient is well-developed, well-nourished in no acute distress.  Resting comfortably  at home.  Head is normocephalic, atraumatic.  No labored breathing.  Speech is clear and coherent with logical content.  Patient is alert and oriented at baseline.  Nasal congestion   Assessment and Plan: 1. Acute sinusitis, recurrence not specified, unspecified location - amoxicillin-clavulanate (AUGMENTIN) 875-125 MG tablet; Take 1 tablet by mouth 2 (two) times daily.  Dispense: 14 tablet; Refill: 0  - Take meds as prescribed - Use a cool mist humidifier  -Use saline nose sprays frequently -Force fluids -For any cough or congestion  Use plain Mucinex- regular strength or max strength is fine -For fever or aces or pains- take tylenol or ibuprofen. -Throat lozenges if help -Follow up if symptoms worsen or do not improve   Follow Up Instructions: I discussed the assessment and treatment plan with the patient. The patient was provided an opportunity to ask questions and all were answered. The patient agreed with the plan and demonstrated an understanding of the  instructions.  A copy of instructions were sent to the patient via MyChart unless otherwise noted below.     The patient was advised to call back or seek an in-person evaluation if the symptoms worsen or if the condition fails to improve as anticipated.  Time:  I spent 12 minutes with the patient via telehealth technology discussing the above problems/concerns.    Cassidy Dun, FNP

## 2022-09-24 ENCOUNTER — Other Ambulatory Visit: Payer: Self-pay | Admitting: Family

## 2022-09-24 DIAGNOSIS — M5432 Sciatica, left side: Secondary | ICD-10-CM

## 2022-09-25 ENCOUNTER — Encounter: Payer: Self-pay | Admitting: Family

## 2022-09-25 ENCOUNTER — Ambulatory Visit (INDEPENDENT_AMBULATORY_CARE_PROVIDER_SITE_OTHER): Payer: Medicare Other | Admitting: Family

## 2022-09-25 VITALS — BP 131/89 | HR 84 | Temp 97.5°F | Ht 64.0 in | Wt 141.0 lb

## 2022-09-25 DIAGNOSIS — H938X3 Other specified disorders of ear, bilateral: Secondary | ICD-10-CM

## 2022-09-25 DIAGNOSIS — E039 Hypothyroidism, unspecified: Secondary | ICD-10-CM

## 2022-09-25 DIAGNOSIS — Z79899 Other long term (current) drug therapy: Secondary | ICD-10-CM | POA: Diagnosis not present

## 2022-09-25 DIAGNOSIS — F411 Generalized anxiety disorder: Secondary | ICD-10-CM | POA: Diagnosis not present

## 2022-09-25 DIAGNOSIS — D692 Other nonthrombocytopenic purpura: Secondary | ICD-10-CM

## 2022-09-25 DIAGNOSIS — F1320 Sedative, hypnotic or anxiolytic dependence, uncomplicated: Secondary | ICD-10-CM | POA: Diagnosis not present

## 2022-09-25 DIAGNOSIS — G4700 Insomnia, unspecified: Secondary | ICD-10-CM

## 2022-09-25 DIAGNOSIS — Z944 Liver transplant status: Secondary | ICD-10-CM

## 2022-09-25 DIAGNOSIS — Z1509 Genetic susceptibility to other malignant neoplasm: Secondary | ICD-10-CM

## 2022-09-25 DIAGNOSIS — H6993 Unspecified Eustachian tube disorder, bilateral: Secondary | ICD-10-CM

## 2022-09-25 DIAGNOSIS — I10 Essential (primary) hypertension: Secondary | ICD-10-CM

## 2022-09-25 DIAGNOSIS — H6123 Impacted cerumen, bilateral: Secondary | ICD-10-CM

## 2022-09-25 DIAGNOSIS — K219 Gastro-esophageal reflux disease without esophagitis: Secondary | ICD-10-CM

## 2022-09-25 DIAGNOSIS — D509 Iron deficiency anemia, unspecified: Secondary | ICD-10-CM

## 2022-09-25 MED ORDER — FLUTICASONE PROPIONATE 50 MCG/ACT NA SUSP: 2.0000 | Freq: Every day | NASAL | 6 refills | Status: DC

## 2022-09-25 MED ORDER — ALPRAZOLAM 0.5 MG PO TABS: 0.5000 mg | ORAL_TABLET | Freq: Every day | ORAL | 1 refills | Status: DC

## 2022-09-25 NOTE — Progress Notes (Addendum)
Subjective:    Patient ID: Cassidy Barber, female    DOB: 1951-02-23, 71 y.o.   MRN: [id number]  Chief Complaint  Patient presents with   ears stopped up    Medical Management of Chronic Issues   PT presents to the office today for ear fullness and chronic follow up. She is followed by [facility] for hx of liver transplant. She had an incarcerated ventral hernia with fluid in the hernia sac 04/05/18. [name] She has been diagnosed with GAVE.    She is followed by Hemologists every 3 months for iron deficiency anemia.      She had a left reverse shoulder 07/17/18 and then had to redone in 09/27/19. She is scheduled for reverse shoulder arthroplasty on 07/20/21 on her right shoulder.    She currently has skin cancer on her scalp and is completed 30 radiation treatments then had 20 radiation treatments on another.  She is followed by dermatologists for recurrent skin cancer. Has purpura senilis. Stable.    She has Lynch syndrome.  Ear Fullness  There is pain in both ears. This is a recurrent problem. The current episode started 1 to 4 weeks ago. The problem has been waxing and waning. The pain is at a severity of 0/10. The patient is experiencing no pain. Associated symptoms include hearing loss. Pertinent negatives include no abdominal pain, coughing, ear discharge, rhinorrhea or sore throat. She has tried antibiotics for the symptoms. The treatment provided no relief.  Hypertension This is a chronic problem. The current episode started more than 1 year ago. The problem has been resolved since onset. The problem is controlled. Associated symptoms include anxiety, malaise/fatigue and peripheral edema. Pertinent negatives include no chest pain or shortness of breath. Risk factors for coronary artery disease include dyslipidemia and obesity. The current treatment provides moderate improvement. There is no history of heart failure. Identifiable causes of hypertension include a thyroid problem.  Gastroesophageal  Reflux She complains of belching, heartburn and a hoarse voice. She reports no abdominal pain, no chest pain, no coughing, no nausea or no sore throat. This is a chronic problem. The current episode started more than 1 year ago. Associated symptoms include fatigue. She has tried a PPI for the symptoms. The treatment provided moderate relief.  Thyroid Problem Presents for follow-up visit. Symptoms include anxiety, dry skin, fatigue and hoarse voice. The symptoms have been stable. There is no history of heart failure.  Anemia Presents for follow-up visit. Symptoms include malaise/fatigue. There has been no abdominal pain or light-headedness. There is no history of heart failure.  Insomnia Primary symptoms: difficulty falling asleep, frequent awakening, malaise/fatigue.   The current episode started more than one year. The onset quality is gradual. The problem occurs intermittently.  Anxiety Presents for follow-up visit. Symptoms include excessive worry, insomnia, irritability, nervous/anxious behavior and restlessness. Patient reports no chest pain, nausea or shortness of breath. Symptoms occur most days. The severity of symptoms is mild.   Her past medical history is significant for anemia.      Review of Systems  Constitutional:  Positive for fatigue, irritability and malaise/fatigue.  HENT:  Positive for hearing loss and hoarse voice. Negative for ear discharge, rhinorrhea and sore throat.   Respiratory:  Negative for cough and shortness of breath.   Cardiovascular:  Negative for chest pain.  Gastrointestinal:  Positive for heartburn. Negative for abdominal pain and nausea.  Neurological:  Negative for light-headedness.  Psychiatric/Behavioral:  The patient is nervous/anxious and has insomnia.  All other systems reviewed and are negative.      Objective:   Physical Exam Vitals reviewed.  Constitutional:      General: She is not in acute distress.    Appearance: She is  well-developed.  HENT:     Head: Normocephalic and atraumatic.     Right Ear: Tympanic membrane normal. There is impacted cerumen.     Left Ear: Tympanic membrane normal.  Eyes:     Pupils: Pupils are equal, round, and reactive to light.  Neck:     Thyroid: No thyromegaly.  Cardiovascular:     Rate and Rhythm: Normal rate and regular rhythm.     Heart sounds: Normal heart sounds. No murmur heard. Pulmonary:     Effort: Pulmonary effort is normal. No respiratory distress.     Breath sounds: Normal breath sounds. No wheezing.  Abdominal:     General: Bowel sounds are normal. There is no distension.     Palpations: Abdomen is soft.     Tenderness: There is no abdominal tenderness.  Musculoskeletal:        General: No tenderness. Normal range of motion.     Cervical back: Normal range of motion and neck supple.  Skin:    General: Skin is warm and dry.  Neurological:     Mental Status: She is alert and oriented to person, place, and time.     Cranial Nerves: No cranial nerve deficit.     Deep Tendon Reflexes: Reflexes are normal and symmetric.  Psychiatric:        Behavior: Behavior normal.        Thought Content: Thought content normal.        Judgment: Judgment normal.    Bilateral ears washed with warm water and peroxide. Pt tolerated well. TM WNL   BP 131/89   Pulse 84   Temp (!) 97.5 F (36.4 C) (Temporal)   Ht _0  (1.626 m)   Wt 141 lb (64 kg)   BMI 24.20 kg/m      Assessment & Plan:  Cassidy Barber comes in today with chief complaint of ears stopped up  and Medical Management of Chronic Issues   Diagnosis and orders addressed:  1. GAD (generalized anxiety disorder) - ALPRAZolam (XANAX) 0.5 MG tablet; Take 1 tablet (0.5 mg total) by mouth at bedtime.  Dispense: 90 tablet; Refill: 1 - CMP14+EGFR - CBC with Differential/Platelet  2. Benzodiazepine dependence ([facility]) - ALPRAZolam (XANAX) 0.5 MG tablet; Take 1 tablet (0.5 mg total) by mouth at bedtime.   Dispense: 90 tablet; Refill: 1 - CMP14+EGFR - CBC with Differential/Platelet  3. Controlled substance agreement signed - ALPRAZolam (XANAX) 0.5 MG tablet; Take 1 tablet (0.5 mg total) by mouth at bedtime.  Dispense: 90 tablet; Refill: 1 - CMP14+EGFR - CBC with Differential/Platelet  4. Hypothyroidism, unspecified type - CMP14+EGFR - CBC with Differential/Platelet - TSH  5. Hx of liver transplant ([place]) - CMP14+EGFR - CBC with Differential/Platelet - Everolimus  6. Insomnia, unspecified type - CMP14+EGFR - CBC with Differential/Platelet  7. Iron deficiency anemia, unspecified iron deficiency anemia type - CMP14+EGFR - CBC with Differential/Platelet  8. Gastroesophageal reflux disease, unspecified whether esophagitis present - CMP14+EGFR - CBC with Differential/Platelet  9. Lynch syndrome - CMP14+EGFR - CBC with Differential/Platelet  10. Primary hypertension - CMP14+EGFR - CBC with Differential/Platelet  11. Purpura senilis (HCC) - CMP14+EGFR - CBC with Differential/Platelet  12. Sensation of fullness in both ears  13. Bilateral impacted cerumen   14.  Dysfunction of both eustachian tubes Zyrtec, flonase, and nasal decongestant    Labs pending Health Maintenance reviewed Diet and exercise encouraged  Follow up plan: 6 months    [name], FNP

## 2022-09-25 NOTE — Patient Instructions (Signed)
Health Maintenance After Age 71 After age 71, you are at a higher risk for certain long-term diseases and infections as well as injuries from falls. Falls are a major cause of broken bones and head injuries in people who are older than age 71. Getting regular preventive care can help to keep you healthy and well. Preventive care includes getting regular testing and making lifestyle changes as recommended by your health care provider. Talk with your health care provider about: Which screenings and tests you should have. A screening is a test that checks for a disease when you have no symptoms. A diet and exercise plan that is right for you. What should I know about screenings and tests to prevent falls? Screening and testing are the best ways to find a health problem early. Early diagnosis and treatment give you the best chance of managing medical conditions that are common after age 71. Certain conditions and lifestyle choices may make you more likely to have a fall. Your health care provider may recommend: Regular vision checks. Poor vision and conditions such as cataracts can make you more likely to have a fall. If you wear glasses, make sure to get your prescription updated if your vision changes. Medicine review. Work with your health care provider to regularly review all of the medicines you are taking, including over-the-counter medicines. Ask your health care provider about any side effects that may make you more likely to have a fall. Tell your health care provider if any medicines that you take make you feel dizzy or sleepy. Strength and balance checks. Your health care provider may recommend certain tests to check your strength and balance while standing, walking, or changing positions. Foot health exam. Foot pain and numbness, as well as not wearing proper footwear, can make you more likely to have a fall. Screenings, including: Osteoporosis screening. Osteoporosis is a condition that causes  the bones to get weaker and break more easily. Blood pressure screening. Blood pressure changes and medicines to control blood pressure can make you feel dizzy. Depression screening. You may be more likely to have a fall if you have a fear of falling, feel depressed, or feel unable to do activities that you used to do. Alcohol use screening. Using too much alcohol can affect your balance and may make you more likely to have a fall. Follow these instructions at home: Lifestyle Do not drink alcohol if: Your health care provider tells you not to drink. If you drink alcohol: Limit how much you have to: 0-1 drink a day for women. 0-2 drinks a day for men. Know how much alcohol is in your drink. In the [place] one drink equals one 12 oz bottle of beer (355 mL), one 5 oz glass of wine (148 mL), or one 1 oz glass of hard liquor (44 mL). Do not use any products that contain nicotine or tobacco. These products include cigarettes, chewing tobacco, and vaping devices, such as e-cigarettes. If you need help quitting, ask your health care provider. Activity  Follow a regular exercise program to stay fit. This will help you maintain your balance. Ask your health care provider what types of exercise are appropriate for you. If you need a cane or walker, use it as recommended by your health care provider. Wear supportive shoes that have nonskid soles. Safety  Remove any tripping hazards, such as rugs, cords, and clutter. Install safety equipment such as grab bars in bathrooms and safety rails on stairs. Keep rooms and walkways   well-lit. General instructions Talk with your health care provider about your risks for falling. Tell your health care provider if: You fall. Be sure to tell your health care provider about all falls, even ones that seem minor. You feel dizzy, tiredness (fatigue), or off-balance. Take over-the-counter and prescription medicines only as told by your health care provider. These include  supplements. Eat a healthy diet and maintain a healthy weight. A healthy diet includes low-fat dairy products, low-fat (lean) meats, and fiber from whole grains, beans, and lots of fruits and vegetables. Stay current with your vaccines. Schedule regular health, dental, and eye exams. Summary Having a healthy lifestyle and getting preventive care can help to protect your health and wellness after age 71. Screening and testing are the best way to find a health problem early and help you avoid having a fall. Early diagnosis and treatment give you the best chance for managing medical conditions that are more common for people who are older than age 71. Falls are a major cause of broken bones and head injuries in people who are older than age 71. Take precautions to prevent a fall at home. Work with your health care provider to learn what changes you can make to improve your health and wellness and to prevent falls. This information is not intended to replace advice given to you by your health care provider. Make sure you discuss any questions you have with your health care provider. Document Revised: 03/20/2021 Document Reviewed: 03/20/2021 Elsevier Patient Education  2023 [employer]  

## 2022-09-27 LAB — TSH: TSH: 1.68 u[IU]/mL (ref 0.450–4.500)

## 2022-09-27 LAB — CBC WITH DIFFERENTIAL/PLATELET
Basophils Absolute: 0.1 10*3/uL (ref 0.0–0.2)
Basos: 2 %
EOS (ABSOLUTE): 0.2 10*3/uL (ref 0.0–0.4)
Eos: 3 %
Hematocrit: 42 % (ref 34.0–46.6)
Hemoglobin: 13.4 g/dL (ref 11.1–15.9)
Immature Grans (Abs): 0 10*3/uL (ref 0.0–0.1)
Immature Granulocytes: 0 %
Lymphocytes Absolute: 1.6 10*3/uL (ref 0.7–3.1)
Lymphs: 32 %
MCH: 27.1 pg (ref 26.6–33.0)
MCHC: 31.9 g/dL (ref 31.5–35.7)
MCV: 85 fL (ref 79–97)
Monocytes Absolute: 0.7 10*3/uL (ref 0.1–0.9)
Monocytes: 15 %
Neutrophils Absolute: 2.4 10*3/uL (ref 1.4–7.0)
Neutrophils: 48 %
Platelets: 380 10*3/uL (ref 150–450)
RBC: 4.94 x10E6/uL (ref 3.77–5.28)
RDW: 14.1 % (ref 11.7–15.4)
WBC: 4.9 10*3/uL (ref 3.4–10.8)

## 2022-09-27 LAB — CMP14+EGFR
ALT: 15 IU/L (ref 0–32)
AST: 27 IU/L (ref 0–40)
Albumin/Globulin Ratio: 1.3 (ref 1.2–2.2)
Albumin: 4 g/dL (ref 3.8–4.8)
Alkaline Phosphatase: 150 IU/L — ABNORMAL HIGH (ref 44–121)
BUN/Creatinine Ratio: 12 (ref 12–28)
BUN: 11 mg/dL (ref 8–27)
Bilirubin Total: 0.3 mg/dL (ref 0.0–1.2)
CO2: 24 mmol/L (ref 20–29)
Calcium: 9.5 mg/dL (ref 8.7–10.3)
Chloride: 101 mmol/L (ref 96–106)
Creatinine, Ser: 0.91 mg/dL (ref 0.57–1.00)
Globulin, Total: 3.1 g/dL (ref 1.5–4.5)
Glucose: 96 mg/dL (ref 70–99)
Potassium: 5.1 mmol/L (ref 3.5–5.2)
Sodium: 140 mmol/L (ref 134–144)
Total Protein: 7.1 g/dL (ref 6.0–8.5)
eGFR: 67 mL/min/{1.73_m2} (ref >59)

## 2022-09-27 LAB — EVEROLIMUS: Everolimus: 5.3 ng/mL (ref 3.0–8.0)

## 2022-10-08 NOTE — Progress Notes (Unsigned)
[place], [place] [address]   CLINIC:  Medical Oncology/Hematology  PCP:  [name], FNP [address] [place] [address] [phone]   REASON FOR VISIT: Iron deficiency anemia   CURRENT THERAPY: Intermittent IV iron   INTERVAL HISTORY: [name] 71 year old female) is contacted today for follow-up of her iron deficiency anemia.  She was last evaluated via telemedicine visit by Tarri Abernethy PA-C on 07/06/2022.   At today's visit, she reports feeling more fatigued than usual.  She continues to have fatigue with energy that "comes and goes," currently at 50%.  She continues to have ongoing loose, dark bowel movements which have been chronic ever since her colectomy.  This is unchanged, despite most recent EGD/APC treatment of GAVE on 06/04/2022.  No gross hematochezia, epistaxis, or other source of blood loss.  She been having some mild dyspnea on exertion.  No pica, restless legs, chest pain, lightheadedness, or syncope.  She reports 50% energy and 100% appetite.  She is maintaining a stable weight at this time.    REVIEW OF SYSTEMS:  Review of Systems  Constitutional:  Positive for fatigue. Negative for appetite change, chills, diaphoresis, fever and unexpected weight change.  HENT:   Negative for lump/mass and nosebleeds.   Eyes:  Negative for eye problems.  Respiratory:  Positive for shortness of breath (mild, with exertion). Negative for cough and hemoptysis.   Cardiovascular:  Negative for chest pain, leg swelling and palpitations.  Gastrointestinal:  Positive for diarrhea. Negative for abdominal pain, blood in stool, constipation, nausea and vomiting.  Genitourinary:  Negative for hematuria.   Skin: Negative.   Neurological:  Negative for dizziness, headaches and light-headedness.  Hematological:  Does not bruise/bleed easily.  Psychiatric/Behavioral:  Positive for sleep disturbance.       PAST  MEDICAL/SURGICAL HISTORY:  Past Medical History:  Diagnosis Date   Abdominal wall hernia    Incarcerated status post surgical repair 2019 - [facility]   Anemia of chronic disease    Atypical nevus 01/21/2018   atypical neoplasm- Left scalp-ant (txpbx + MOHS), atypical neoplasm- Left scalp post- (txpbx + MOHS)   Basal cell carcinoma    Colon cancer ([facility])    Colon surgery 2005 and 2012   History of pulmonary hypertension    Pre liver transplant   Hypertension    Hypothyroidism    Lynch syndrome    Osteopenia    Primary biliary cirrhosis (HCC)    Status post liver transplantation - follows at [facility]   SCCA (squamous cell carcinoma) of skin 02/23/2020   Right Upper Chest(moderate) (MOH's)   SCCA (squamous cell carcinoma) of skin 04/07/2020   Left Top Leg (Keratoacanthoma) treatment after biopsy   SCCA (squamous cell carcinoma) of skin 04/07/2020   Left Foot Dorsal (in situ) treatment after biopsy   SCCA (squamous cell carcinoma) of skin 03/27/2021   Right Upper Back (Keratoacanthoma) (excision) (clear)   SCCA (squamous cell carcinoma) of skin 06/13/2021   Right Shoulder - anterior (moderately differentiated) (tx p bx)   SCCA (squamous cell carcinoma) of skin 06/13/2021   Right Thigh - anterior (well differentiated) (tx p bx)   SCCA (squamous cell carcinoma) of skin 06/13/2021   Right Lower Leg - anterior (well differentiated) (tx p bx)   Squamous cell carcinoma of skin 04/22/2018   KA-Right mid chest (txpbx), KA-left elbow crease (txpbx), insitu-Right mid chest inf. (exc)   Squamous cell carcinoma of skin 05/20/2018  well diff-Left upper shin (txpbx), well diff-Right lower forearm (txpbx), well diff-Right upper shin (txpbx)   Squamous cell carcinoma of skin 06/11/2018   Scc + margin-Right mid chest inferior    Squamous cell carcinoma of skin 06/27/2018   well diff-Left mid thigh(txpbx), well diff-Left inner thigh (txpbx),insitu-Right cheek (txpbx),well diff-right inner heel (txpbx)    Squamous cell carcinoma of skin 09/16/2018   well diff-left shoulder (txpbx), well diff-Right chin (txpbx), well diff-right chest lateral (CX35FU)   Squamous cell carcinoma of skin 10/01/2018   Right outer lower shin (Txpbx)   Squamous cell carcinoma of skin 04/03/2019   well diff-Right center chest (MOHS), in situ-Right ear   Squamous cell carcinoma of skin 08/05/2019   in situ-Left calf (txpbx), in situ-left bicep (txpbx), well diff-Left chest,inf(txpbx), in situ-Right chest inf-(txpbx)   Squamous cell carcinoma of skin 11/19/2019   KA-left top leg (txpbx), modify-Riight forehead-(mohs), in situ-right hand (txpbx), in situ-Right forearm (txpbx), well diff-Right chest (txpbx), well diff-chin (txpbx)   Squamous cell carcinoma of skin 01/06/2020   KA- Left top leg   Squamous cell carcinoma of skin 05/12/2013   bowens-middle of chest (CX35FU)   Squamous cell carcinoma of skin 05/18/2015   well diff-Left upper arm (CX35FU + Exc),KA-right chest(txpbx), in situ-Left shin (txpbx), well diff-Right cheek (CX35FU), KA-Left post scalp (CX35FU)   Squamous cell carcinoma of skin 08/09/2015   KA-Left post scalp ((MOHS), in situ- mid chest (Txpbx +exc), in situ-Left upper arm inferior (txpbx)   Squamous cell carcinoma of skin 10/13/2015   Left upper arm-clear   Squamous cell carcinoma of skin 03/09/2016   mod diff-mid chest (txpbx+ exc), mod diff-Right chest (txpbx+exc), well diff-right cheek-(txpbx),well diff-Left hand-(txpbx), in situ-Left upper arm (txpbx), well diff-Right cheek -(txpbx), well diff-Right crease arm (txpbx)    Squamous cell carcinoma of skin 05/24/2016   well diff-Right nasal crease-(MOHS)   Squamous cell carcinoma of skin 08/02/2016   KA-Left chest med (txpbx)   Squamous cell carcinoma of skin 08/30/2016   in situ-Left outer zygoma (txpbx)   Squamous cell carcinoma of skin 12/06/2016   well diff-Left chest sup, Left shoudler, insitu- right post scalp   Squamous cell carcinoma of  skin 02/14/2017   well diff-Left forearm (EXC),in situ-RIght ant neck   Squamous cell carcinoma of skin 06/14/2017   in situ-Right forearm (txpbx), in situ-Right chest (txpbx), well diff-left chest (txpbx), well diff-anterior neck- (txpbx)   Squamous cell carcinoma of skin 08/07/2017   well diff-Left upper shoulder (txpbx), sup and invasive-Left temple (txpbx), well diff-Right upper shin (txpbx), in situ-Right clavicle (txpbx)   Squamous cell carcinoma of skin 10/17/2017   well diff-ant. neck (MOHS), in situ-Right chest, inf (txpbx)   Squamous cell carcinoma of skin 01/21/2018   well diff- Right chest,ulnar (txpbx), well diff- right upper chest (txpbx), in situ-Right ant. crown (txpbx)   Squamous cell carcinoma of skin 08/02/2020   well diff-left lower leg-inferior (Txpbx)   Squamous cell carcinoma of skin 08/02/2020   well diff-right lower leg-mid (txpbx)   Squamous cell carcinoma of skin 08/02/2020   well diff-left chest upper   Squamous cell carcinoma of skin 08/02/2020   well diff-mid parietal scalp (MOHS)   Squamous cell carcinoma of skin 08/02/2020   well diff-right foot inner(txpbx)   Squamous cell carcinoma of skin 08/02/2020   well diff- left lower leg medial (txpbx)   Squamous cell carcinoma of skin 08/02/2020   well diff-left lower leg anterior (txpbx)   Squamous cell carcinoma of skin  08/02/2020   well diff-left lower leg medial (txpbx)   Squamous cell carcinoma of skin 08/02/2020   well diff-right forearm-posterior (txpbx)   Past Surgical History:  Procedure Laterality Date   ABDOMINAL HERNIA REPAIR     Patient's states that she has had 8- 9 hernia surgeries   ABDOMINAL HYSTERECTOMY     BIOPSY  02/15/2021   Procedure: BIOPSY;  Surgeon: Rogene Houston, MD;  [facility];  Service: Endoscopy;;   BIOPSY  12/20/2021   Procedure: BIOPSY;  Surgeon: Rogene Houston, MD;  [facility];  Service: Endoscopy;;   CATARACT EXTRACTION W/PHACO Right  01/15/2020   Procedure: CATARACT EXTRACTION PHACO AND INTRAOCULAR LENS PLACEMENT ([place]);  Surgeon: Baruch Goldmann, MD;  [facility]: [facility];  Service: Ophthalmology;  Laterality: Right;  CDE: 7.89   CATARACT EXTRACTION W/PHACO Left 01/29/2020   Procedure: CATARACT EXTRACTION PHACO AND INTRAOCULAR LENS PLACEMENT (I[facility]) (CDE: 6.33);  Surgeon: Baruch Goldmann, MD;  [facility]: [facility];  Service: Ophthalmology;  Laterality: Left;   CHOLECYSTECTOMY  2007   COLON SURGERY  2008   [facility] at [facility]   COLONOSCOPY     Done at [facility]   ESOPHAGOGASTRODUODENOSCOPY N/A 08/21/2018   Procedure: ESOPHAGOGASTRODUODENOSCOPY (EGD);  Surgeon: Rogene Houston, MD;  [facility];  Service: Endoscopy;  Laterality: N/A;   ESOPHAGOGASTRODUODENOSCOPY (EGD) WITH PROPOFOL N/A 12/16/2019   Procedure: ESOPHAGOGASTRODUODENOSCOPY (EGD) WITH PROPOFOL;  Surgeon: Rogene Houston, MD;  [facility];  Service: Endoscopy;  Laterality: N/A;   ESOPHAGOGASTRODUODENOSCOPY (EGD) WITH PROPOFOL N/A 12/20/2021   Procedure: ESOPHAGOGASTRODUODENOSCOPY (EGD) WITH PROPOFOL;  Surgeon: Rogene Houston, MD;  [facility];  Service: Endoscopy;  Laterality: N/A;  9:05   ESOPHAGOGASTRODUODENOSCOPY (EGD) WITH PROPOFOL N/A 02/14/2022   Procedure: ESOPHAGOGASTRODUODENOSCOPY (EGD) WITH PROPOFOL;  Surgeon: Rogene Houston, MD;  [facility];  Service: Endoscopy;  Laterality: N/A;  730   ESOPHAGOGASTRODUODENOSCOPY (EGD) WITH PROPOFOL N/A 06/04/2022   Procedure: ESOPHAGOGASTRODUODENOSCOPY (EGD) WITH PROPOFOL;  Surgeon: Harvel Quale, MD;  [facility];  Service: Gastroenterology;  Laterality: N/A;  145   EYE SURGERY     lasix   FLEXIBLE SIGMOIDOSCOPY N/A 10/20/2015   Procedure: FLEXIBLE SIGMOIDOSCOPY;  Surgeon: Rogene Houston, MD;  [facility];  Service: Endoscopy;  Laterality: N/A;  12 - Dr Laural Golden has meeting until 1:00   FLEXIBLE SIGMOIDOSCOPY N/A 07/11/2016   Procedure: FLEXIBLE  SIGMOIDOSCOPY;  Surgeon: Rogene Houston, MD;  [facility];  Service: Endoscopy;  Laterality: N/A;  [place] N/A 08/09/2017   Procedure: FLEXIBLE SIGMOIDOSCOPY;  Surgeon: Rogene Houston, MD;  [facility];  Service: Endoscopy;  Laterality: N/A;  1:00   FLEXIBLE SIGMOIDOSCOPY N/A 08/21/2018   Procedure: FLEXIBLE SIGMOIDOSCOPY;  Surgeon: Rogene Houston, MD;  [facility];  Service: Endoscopy;  Laterality: N/A;   FLEXIBLE SIGMOIDOSCOPY N/A 12/16/2019   Procedure: FLEXIBLE SIGMOIDOSCOPY wirh Propofol;  Surgeon: Rogene Houston, MD;  [facility];  Service: Endoscopy;  Laterality: N/A;  7:30   FLEXIBLE SIGMOIDOSCOPY N/A 02/15/2021   Procedure: FLEXIBLE SIGMOIDOSCOPY WITH PROPOFOL;  Surgeon: Rogene Houston, MD;  [facility];  Service: Endoscopy;  Laterality: N/A;  am   FLEXIBLE SIGMOIDOSCOPY N/A 12/20/2021   Procedure: FLEXIBLE SIGMOIDOSCOPY;  Surgeon: Rogene Houston, MD;  [facility];  Service: Endoscopy;  Laterality: N/A;   FRACTURE SURGERY     right wrist metal  plate   GI RADIOFREQUENCY ABLATION N/A 02/14/2022   Procedure: GI RADIOFREQUENCY ABLATION;  Surgeon: Rogene Houston, MD;  [facility];  Service: Endoscopy;  Laterality: N/A;   HOT HEMOSTASIS N/A 12/20/2021   Procedure: HOT HEMOSTASIS (ARGON PLASMA COAGULATION/BICAP);  Surgeon: Rogene Houston, MD;  [facility];  Service: Endoscopy;  Laterality: N/A;   HOT HEMOSTASIS  06/04/2022   Procedure: HOT HEMOSTASIS (ARGON PLASMA COAGULATION/BICAP);  Surgeon: Montez Morita, Quillian Quince, MD;  [facility];  Service: Gastroenterology;;   LIVER TRANSPLANT  11/19/2013   POLYPECTOMY  08/09/2017   Procedure: POLYPECTOMY;  Surgeon: Rogene Houston, MD;  [facility];  Service: Endoscopy;;  colon small bowel   POLYPECTOMY N/A 02/14/2022   Procedure: POLYPECTOMY;  Surgeon: Rogene Houston, MD;  [facility];   Service: Endoscopy;  Laterality: N/A;   REVERSE SHOULDER ARTHROPLASTY Left 07/17/2018   Procedure: LEFT REVERSE SHOULDER ARTHROPLASTY;  Surgeon: Justice Britain, MD;  Location: [facility];  Service: Orthopedics;  Laterality: Left;  198mn   REVERSE SHOULDER ARTHROPLASTY Right 07/20/2021   Procedure: REVERSE SHOULDER ARTHROPLASTY;  Surgeon: SJustice Britain MD;  [facility];  Service: Orthopedics;  Laterality: Right;   SHOULDER CLOSED REDUCTION Left 09/27/2019   Procedure: CLOSED REDUCTION SHOULDER;  Surgeon: OParalee Cancel MD;  [facility];  Service: Orthopedics;  Laterality: Left;   SPLENECTOMY  2006   TOTAL SHOULDER REVISION Left 11/12/2019   Procedure: Revision Left Reverse Shoulder Arthroplasty with poly exchange SDD;  Surgeon: SJustice Britain MD;  [facility];  Service: Orthopedics;  Laterality: Left;  1235m -SDDC   TYMPANOSTOMY TUBE PLACEMENT     UPPER GASTROINTESTINAL ENDOSCOPY     [facility] [place]   SOCIAL HISTORY:  Social History   Socioeconomic History   Marital status: Married    Spouse name: Johnny   Number of children: 1   Years of education: 12   Highest education level: High school graduate  Occupational History   Occupation: Disability    Employer: [employer]    Comment: AdMultimedia programmerTobacco Use   Smoking status: Never   Smokeless tobacco: Never  Vaping Use   Vaping Use: Never used  Substance and Sexual Activity   Alcohol use: No    Alcohol/week: 0.0 standard drinks of alcohol   Drug use: No   Sexual activity: Yes    Birth control/protection: None  Other Topics Concern   Not on file  Social History Narrative   Patient lives in a two story home with her husband. She has an adult son. 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    Social Determinants of Health   Financial Resource Strain: Low Risk  (07/02/2022)   Overall Financial Resource Strain (CARDIA)    Difficulty of Paying Living Expenses: Not hard at all   Food Insecurity: No Food Insecurity (07/02/2022)   Hunger Vital Sign    Worried About Running Out of Food in the Last Year: Never true    Ran Out of Food in the Last Year: Never true  Transportation Needs: No Transportation Needs (07/02/2022)   PRAPARE - TrHydrologistMedical): No    Lack of Transportation (Non-Medical): No  Physical Activity: Insufficiently Active (07/02/2022)   Exercise Vital Sign    Days of Exercise per Week: 7 days    Minutes of Exercise per Session: 10 min  Stress: No Stress Concern Present (  07/02/2022)   [place]    Feeling of Stress : Not at all  Social Connections: [place] (07/02/2022)   Social Connection and Isolation Panel [NHANES]    Frequency of Communication with Friends and Family: More than three times a week    Frequency of Social Gatherings with Friends and Family: More than three times a week    Attends Religious Services: More than 4 times per year    Active Member of [employer] or Organizations: Yes    Attends Music therapist: More than 4 times per year    Marital Status: Married  Human resources officer Violence: Not At Risk (07/02/2022)   Humiliation, Afraid, Rape, and Kick questionnaire    Fear of Current or Ex-Partner: No    Emotionally Abused: No    Physically Abused: No    Sexually Abused: No    FAMILY HISTORY:  Family History  Problem Relation Age of Onset   Prostate cancer Father    Colon cancer Father    Colon cancer Sister    Lung cancer Sister    Healthy Son    Alcohol abuse Brother    Allergic rhinitis Neg Hx    Asthma Neg Hx    Eczema Neg Hx    Urticaria Neg Hx     CURRENT MEDICATIONS:  Outpatient Encounter Medications as of 10/09/2022  Medication Sig   acetaminophen (TYLENOL) 500 MG tablet Take 1,000 mg by mouth every 6 (six) hours as needed (for pain.).    alendronate (FOSAMAX) 70 MG tablet TAKE 1 TABLET EVERY  WEEK (Patient taking differently: Take 70 mg by mouth every Thursday. TAKE 1 TABLET EVERY WEEK)   ALPRAZolam (XANAX) 0.5 MG tablet Take 1 tablet (0.5 mg total) by mouth at bedtime.   Ascorbic Acid (VITAMIN C) 500 MG CHEW Chew 1,000 mg by mouth daily.   Biotin 1000 MCG tablet Take 1,000 mcg by mouth in the morning and at bedtime.   Biotin w/ Vitamins C & E (HAIR SKIN & NAILS GUMMIES PO) Take 1 tablet by mouth in the morning and at bedtime.   Calcium Citrate-Vitamin D (CALCIUM CITRATE + D3 PO) Take 1 tablet by mouth 2 (two) times daily.   cetirizine (ZYRTEC) 10 MG tablet Take 1 tablet (10 mg total) by mouth daily as needed for allergies.   Clobetasol Prop Emollient Base (CLOBETASOL PROPIONATE E) 0.05 % emollient cream Apply 1 application. topically 2 (two) times daily.   clotrimazole-betamethasone (LOTRISONE) cream Apply 1 application topically 2 (two) times daily. (Patient taking differently: Apply 1 application  topically daily as needed (Rash).)   Cyanocobalamin (VITAMIN B-12) 5000 MCG TBDP Take 5,000 mcg by mouth 2 (two) times a week.   cyclobenzaprine (FLEXERIL) 10 MG tablet Take 1 tablet (10 mg total) by mouth 3 (three) times daily as needed for muscle spasms.   famotidine (PEPCID) 20 MG tablet Take 1 tablet (20 mg total) by mouth 2 (two) times daily.   fluorouracil (EFUDEX) 5 % cream Apply 1 application topically daily as needed (cancer spots).    fluticasone (FLONASE) 50 MCG/ACT nasal spray Place 2 sprays into both nostrils daily.   gabapentin (NEURONTIN) 100 MG capsule TAKE ONE CAPSULE BY MOUTH DAILY   levothyroxine (SYNTHROID) 112 MCG tablet Take 1 tablet (112 mcg total) by mouth daily.   losartan (COZAAR) 25 MG tablet Take 1 tablet (25 mg total) by mouth daily.   metoprolol succinate (TOPROL XL) 25 MG 24 hr tablet  Take 1 tablet (25 mg total) by mouth daily.   Multiple Vitamins-Minerals (MULTIVITAMIN WITH MINERALS) tablet Take 1 tablet by mouth daily.   mupirocin ointment (BACTROBAN) 2 %  Apply 1 application topically daily as needed (Cancer spots).   NIACINAMIDE-ZINC-FOLIC ACID PO Take 2 tablets by mouth 2 (two) times daily.   pantoprazole (PROTONIX) 40 MG tablet Take 1 tablet (40 mg total) by mouth 2 (two) times daily.   silver sulfADIAZINE (SILVADENE) 1 % cream Apply 1 Application topically daily. Apply to large surface area once to twice daily (Patient taking differently: Apply 1 Application topically daily as needed (skin irritation). Apply to large surface area once to twice daily)   ursodiol (ACTIGALL) 250 MG tablet Take 250 mg by mouth 2 (two) times daily.    Vitamin D, Ergocalciferol, (DRISDOL) 1.25 MG (50000 UNIT) CAPS capsule TAKE 1 CAPSULE BY MOUTH ONCE A WEEK (Patient taking differently: Take 50,000 Units by mouth every Tuesday.)   vitamin E 180 MG (400 UNITS) capsule Take 400 Units by mouth daily.   ZORTRESS 0.5 MG TABS Take 4 tablets (2 mg total) by mouth 2 (two) times daily.   No facility-administered encounter medications on file as of 10/09/2022.    ALLERGIES:  Allergies  Allergen Reactions   Ciprofloxacin Itching   Codeine Nausea Only     PHYSICAL EXAM:  ECOG PERFORMANCE STATUS: 1 - Symptomatic but completely ambulatory  There were no vitals filed for this visit. There were no vitals filed for this visit. Physical Exam Constitutional:      Appearance: Normal appearance.  HENT:     Head: Normocephalic and atraumatic.     Mouth/Throat:     Mouth: Mucous membranes are moist.  Eyes:     Extraocular Movements: Extraocular movements intact.     Pupils: Pupils are equal, round, and reactive to light.  Cardiovascular:     Rate and Rhythm: Normal rate and regular rhythm.     Pulses: Normal pulses.     Heart sounds: Normal heart sounds.  Pulmonary:     Effort: Pulmonary effort is normal.     Breath sounds: Normal breath sounds.  Abdominal:     General: Bowel sounds are normal.     Palpations: Abdomen is soft.     Tenderness: There is no abdominal  tenderness.  Musculoskeletal:        General: No swelling.     Right lower leg: No edema.     Left lower leg: No edema.  Lymphadenopathy:     Cervical: No cervical adenopathy.  Skin:    General: Skin is warm and dry.  Neurological:     General: No focal deficit present.     Mental Status: She is alert and oriented to person, place, and time.  Psychiatric:        Mood and Affect: Mood normal.        Behavior: Behavior normal.      LABORATORY DATA:  I have reviewed the labs as listed.  CBC    Component Value Date/Time   WBC 4.9 09/25/2022 0952   WBC 5.9 03/29/2022 0910   RBC 4.94 09/25/2022 0952   RBC 4.58 03/29/2022 0910   HGB 13.4 09/25/2022 0952   HCT 42.0 09/25/2022 0952   PLT 380 09/25/2022 0952   MCV 85 09/25/2022 0952   MCH 27.1 09/25/2022 0952   MCH 26.9 03/29/2022 0910   MCHC 31.9 09/25/2022 0952   MCHC 29.5 (L) 03/29/2022 0910   RDW 14.1 09/25/2022  0952   LYMPHSABS 1.6 09/25/2022 0952   MONOABS 0.9 03/29/2022 0910   EOSABS 0.2 09/25/2022 0952   BASOSABS 0.1 09/25/2022 0952      Latest Ref Rng & Units 09/25/2022    9:52 AM 06/08/2022   12:39 PM 01/19/2022    8:34 AM  CMP  Glucose 70 - 99 mg/dL 96  102  98   BUN 8 - 27 mg/dL '11  18  11   '$ Creatinine 0.57 - 1.00 mg/dL 0.91  0.93  0.94   Sodium 134 - 144 mmol/L 140  137  136   Potassium 3.5 - 5.2 mmol/L 5.1  4.4  4.1   Chloride 96 - 106 mmol/L 101  98  101   CO2 20 - 29 mmol/L '24  21  24   '$ Calcium 8.7 - 10.3 mg/dL 9.5  9.3  8.9   Total Protein 6.0 - 8.5 g/dL 7.1  7.4    Total Bilirubin 0.0 - 1.2 mg/dL 0.3  <0.2    Alkaline Phos 44 - 121 IU/L 150  163    AST 0 - 40 IU/L 27  28    ALT 0 - 32 IU/L 15  16      DIAGNOSTIC IMAGING:  I have independently reviewed the relevant imaging and discussed with the patient.  ASSESSMENT & PLAN: 1.  Iron deficiency anemia from occult GI blood loss: - Unable to tolerate oral iron. - Secondary to history of colon cancer s/p abdominal colectomy, malabsorption, and  chronic blood loss from GAVE -  Most recent EGD (06/04/2022) showed GAVE without bleeding, treated with APC. - Most recent sigmoidoscopy (12/20/2021): Mucosal ulceration in distal rectum, end-to-end ileocolonic anastomosis characterized by healthy-appearing mucosa - SPEP negative in 2019 - She last received IV iron (Venofer 300 mg x 3) from 01/02/2022 through 01/19/2022 - Currently symptomatic with worsening fatigue and mild DOE - Dark stools ever since her colectomy.  Denies any epistaxis or rectal hemorrhage - Most recent labs (10/09/2022): Hgb 12.1/MCV 90.1, ferritin 48, iron saturation 25% - PLAN: Recommend Venofer 300 mg x 2 due to symptomatic iron deficiency. - Labs in 3 months, phone visit 1 week after  - Continue follow-up with GI (Dr. Jenetta Downer) for management of any GI blood loss   2.  Vitamin B12 deficiency - Taking Vitamin B12 tablet once daily - B12/MMA checked today, results pending - PLAN: Continue B12 supplements at home.  We will contact patient with results if any adjustments of her B12 dose is needed.  3.  Liver Transplant: - Had this completed at [facility] in January 2015.  - She is on immunosuppression with everolimus.   4.  Skin cancer on her scalp: - Secondary to chronic immunosuppression for liver transplant; lynch syndrome - She is status post 30 treatments of radiation to her scalp. - She is followed by dermatology and has had several areas treated recently with topical creams.   5.  History of colon cancer: - Status post abdominal colectomy on 09/17/2011 with simultaneous total abdominal hysterectomy and bilateral salpingo-oophorectomy. - Found to have Lynch syndrome. - Per recommendations she is to have an EGD every 2 to 3 years and annual colonoscopy for review of her residual colon tissue at the anastomosis. - Sigmoidoscopy (02/15/2021): Two nonbleeding erosions at ileocolonic anastomosis, small external hemorrhoids   PLAN SUMMARY: >> (Watch for B12/MMA results,  send a MyChart message to discuss) >> Venofer 300 mg x 2 >> Labs in 3 months (CBC/D, ferritin, iron/TIBC) >> PHONE  visit 1 week after labs   All questions were answered. The patient knows to call the clinic with any problems, questions or concerns.  Medical decision making: Moderate  Time spent on visit: I spent 20 minutes counseling the patient face to face. The total time spent in the appointment was 30 minutes and more than 50% was on counseling.   Harriett Rush, PA-C  10/09/2022 11:05 AM

## 2022-10-09 ENCOUNTER — Encounter: Payer: Self-pay | Admitting: Physician Assistant

## 2022-10-09 ENCOUNTER — Other Ambulatory Visit: Payer: Self-pay | Admitting: Family

## 2022-10-09 ENCOUNTER — Inpatient Hospital Stay: Payer: Medicare Other | Attending: Hematology

## 2022-10-09 ENCOUNTER — Inpatient Hospital Stay: Payer: Medicare Other | Admitting: Physician Assistant

## 2022-10-09 ENCOUNTER — Other Ambulatory Visit: Payer: Self-pay

## 2022-10-09 VITALS — BP 152/68 | HR 61 | Temp 96.1°F | Resp 18 | Ht 63.43 in | Wt 145.7 lb

## 2022-10-09 DIAGNOSIS — D638 Anemia in other chronic diseases classified elsewhere: Secondary | ICD-10-CM

## 2022-10-09 DIAGNOSIS — D508 Other iron deficiency anemias: Secondary | ICD-10-CM

## 2022-10-09 DIAGNOSIS — D500 Iron deficiency anemia secondary to blood loss (chronic): Secondary | ICD-10-CM

## 2022-10-09 DIAGNOSIS — E538 Deficiency of other specified B group vitamins: Secondary | ICD-10-CM | POA: Diagnosis not present

## 2022-10-09 DIAGNOSIS — K922 Gastrointestinal hemorrhage, unspecified: Secondary | ICD-10-CM | POA: Insufficient documentation

## 2022-10-09 DIAGNOSIS — Z944 Liver transplant status: Secondary | ICD-10-CM | POA: Insufficient documentation

## 2022-10-09 DIAGNOSIS — M5432 Sciatica, left side: Secondary | ICD-10-CM

## 2022-10-09 LAB — CBC WITH DIFFERENTIAL/PLATELET
Abs Immature Granulocytes: 0.01 10*3/uL (ref 0.00–0.07)
Basophils Absolute: 0.1 10*3/uL (ref 0.0–0.1)
Basophils Relative: 2 %
Eosinophils Absolute: 0.3 10*3/uL (ref 0.0–0.5)
Eosinophils Relative: 5 %
HCT: 39.2 % (ref 36.0–46.0)
Hemoglobin: 12.1 g/dL (ref 12.0–15.0)
Immature Granulocytes: 0 %
Lymphocytes Relative: 33 %
Lymphs Abs: 2 10*3/uL (ref 0.7–4.0)
MCH: 27.8 pg (ref 26.0–34.0)
MCHC: 30.9 g/dL (ref 30.0–36.0)
MCV: 90.1 fL (ref 80.0–100.0)
Monocytes Absolute: 1.1 10*3/uL — ABNORMAL HIGH (ref 0.1–1.0)
Monocytes Relative: 17 %
Neutro Abs: 2.7 10*3/uL (ref 1.7–7.7)
Neutrophils Relative %: 43 %
Platelets: 306 10*3/uL (ref 150–400)
RBC: 4.35 MIL/uL (ref 3.87–5.11)
RDW: 15.4 % (ref 11.5–15.5)
WBC: 6.1 10*3/uL (ref 4.0–10.5)
nRBC: 0 % (ref 0.0–0.2)

## 2022-10-09 LAB — IRON AND TIBC
Iron: 71 ug/dL (ref 28–170)
Saturation Ratios: 25 % (ref 10.4–31.8)
TIBC: 287 ug/dL (ref 250–450)
UIBC: 216 ug/dL

## 2022-10-09 LAB — VITAMIN B12: Vitamin B-12: 599 pg/mL (ref 180–914)

## 2022-10-09 LAB — FERRITIN: Ferritin: 48 ng/mL (ref 11–307)

## 2022-10-09 NOTE — Patient Instructions (Signed)
[facility] [place] **   You were seen today by Tarri Abernethy PA-C for your iron deficiency anemia.    IRON DEFICIENCY ANEMIA Your blood levels look good.  You are not anemic at this time. Your iron levels are lower than they should be.  This may be causing some of your fatigue and shortness of breath with exertion. We will schedule you for IV iron x 2 doses.  B12 DEFICIENCY We will check your vitamin B12 levels today and will send you a MyChart message to discuss the results. Please check the dose of your vitamin B12 at home so that you can let us know what you are taking.  LABS: Return in 3 months for repeat labs **PLEASE LET us KNOW if you have a lab appointment at your primary care doctor around the same time as your lab appointment with [place].  We can easily switch your lab orders to be drawn at your family doctor so that you do not need to have duplicate labs done.  FOLLOW-UP APPOINTMENT: Phone visit in 3 months, 1 week after labs.  ** Thank you for trusting me with your healthcare!  I strive to provide all of my patients with quality care at each visit.  If you receive a survey for this visit, I would be so grateful to you for taking the time to provide feedback.  Thank you in advance!  ~ Vishal Sandlin                   Dr. Derek Jack   &   Tarri Abernethy, PA-C   - - - - - - - - - - - - - - - - - -    Thank you for choosing [facility] to provide your oncology and hematology care.  To afford each patient quality time with our provider, please arrive at least 15 minutes before your scheduled appointment time.   If you have a lab appointment with the [facility] please come in thru the Main Entrance and check in at the main information desk.  You need to re-schedule your appointment should you arrive 10 or more minutes late.  We strive to give you quality time with  our providers, and arriving late affects you and other patients whose appointments are after yours.  Also, if you no show three or more times for appointments you may be dismissed from the clinic at the providers discretion.     Again, thank you for choosing [facility].  Our hope is that these requests will decrease the amount of time that you wait before being seen by our physicians.       _____________________________________________________________  Should you have questions after your visit to [facility], please contact our office at [phone] and follow the prompts.  Our office hours are 8:00 a.m. and 4:30 p.m. Monday - Friday.  Please note that voicemails left after 4:00 p.m. may not be returned until the following business day.  We are closed weekends and major holidays.  You do have access to a nurse 24-7, just call the main number to the clinic [phone] and do not press any options, hold on the line and a nurse will answer the phone.    For prescription refill requests, have your pharmacy contact our office and allow 72 hours.

## 2022-10-10 MED ORDER — GABAPENTIN 100 MG PO CAPS: 100.0000 mg | ORAL_CAPSULE | Freq: Every day | ORAL | 0 refills | Status: DC

## 2022-10-10 NOTE — Telephone Encounter (Signed)
Refill failed, resent

## 2022-10-10 NOTE — Addendum Note (Signed)
Addended by: Antonietta Barcelona D on: 10/10/2022 08:05 AM   Modules accepted: Orders

## 2022-10-11 LAB — METHYLMALONIC ACID, SERUM: Methylmalonic Acid, Quantitative: 184 nmol/L (ref 0–378)

## 2022-10-12 DIAGNOSIS — D485 Neoplasm of uncertain behavior of skin: Secondary | ICD-10-CM | POA: Diagnosis not present

## 2022-10-12 DIAGNOSIS — Z1509 Genetic susceptibility to other malignant neoplasm: Secondary | ICD-10-CM | POA: Diagnosis not present

## 2022-10-12 DIAGNOSIS — C44622 Squamous cell carcinoma of skin of right upper limb, including shoulder: Secondary | ICD-10-CM | POA: Diagnosis not present

## 2022-10-12 DIAGNOSIS — C44722 Squamous cell carcinoma of skin of right lower limb, including hip: Secondary | ICD-10-CM | POA: Diagnosis not present

## 2022-10-19 ENCOUNTER — Inpatient Hospital Stay: Payer: Medicare Other | Attending: Hematology

## 2022-10-19 DIAGNOSIS — D500 Iron deficiency anemia secondary to blood loss (chronic): Secondary | ICD-10-CM | POA: Insufficient documentation

## 2022-10-19 DIAGNOSIS — K922 Gastrointestinal hemorrhage, unspecified: Secondary | ICD-10-CM | POA: Insufficient documentation

## 2022-10-19 NOTE — Progress Notes (Signed)
Patient presents today for Venofer 300 mg iron infusion. Unable to obtain peripheral IV site. Patient rescheduled for Wednesdasy 10/24/2022 at 08:15 am . Reported to R. Pennington PA that patient was rescheduled for iron due to unable to obtain IV site.

## 2022-10-24 ENCOUNTER — Inpatient Hospital Stay: Payer: Medicare Other

## 2022-10-24 VITALS — BP 110/73 | HR 75 | Temp 96.2°F | Resp 18

## 2022-10-24 DIAGNOSIS — D508 Other iron deficiency anemias: Secondary | ICD-10-CM

## 2022-10-24 DIAGNOSIS — D500 Iron deficiency anemia secondary to blood loss (chronic): Secondary | ICD-10-CM | POA: Diagnosis not present

## 2022-10-24 DIAGNOSIS — K922 Gastrointestinal hemorrhage, unspecified: Secondary | ICD-10-CM | POA: Diagnosis not present

## 2022-10-24 MED ORDER — SODIUM CHLORIDE 0.9 % IV SOLN: Freq: Once | INTRAVENOUS | Status: AC

## 2022-10-24 MED ORDER — SODIUM CHLORIDE 0.9 % IV SOLN
300.0000 mg | Freq: Once | INTRAVENOUS | Status: AC
  Administered 2022-10-24: 300 mg via INTRAVENOUS
  Filled 2022-10-24: qty 300

## 2022-10-24 NOTE — Progress Notes (Signed)
Pt presents today for Venofer IV iron infusion per provider's order. Vital signs stable and pt voiced no new complaints at this time.  Pt took pre-meds Tylenol and Claritin at home prior to arrival. Peripheral IV started with good blood return pre and post infusion.  Venofer 300 mg  given today per MD orders. Tolerated infusion without adverse affects. Vital signs stable. No complaints at this time. Discharged from clinic ambulatory in stable condition. Alert and oriented x 3. F/U with [facility] as scheduled.   

## 2022-10-24 NOTE — Patient Instructions (Signed)
[facility]  Discharge Instructions: Thank you for choosing [facility] to provide your oncology and hematology care.  If you have a lab appointment with the Cancer Center, please come in thru the Main Entrance and check in at the main information desk.  Wear comfortable clothing and clothing appropriate for easy access to any Portacath or PICC line.   We strive to give you quality time with your provider. You may need to reschedule your appointment if you arrive late (15 or more minutes).  Arriving late affects you and other patients whose appointments are after yours.  Also, if you miss three or more appointments without notifying the office, you may be dismissed from the clinic at the provider's discretion.      For prescription refill requests, have your pharmacy contact our office and allow 72 hours for refills to be completed.    Today you received Venofer IV iron infusion.     BELOW ARE SYMPTOMS THAT SHOULD BE REPORTED IMMEDIATELY: *FEVER GREATER THAN 100.4 F (38 C) OR HIGHER *CHILLS OR SWEATING *NAUSEA AND VOMITING THAT IS NOT CONTROLLED WITH YOUR NAUSEA MEDICATION *UNUSUAL SHORTNESS OF BREATH *UNUSUAL BRUISING OR BLEEDING *URINARY PROBLEMS (pain or burning when urinating, or frequent urination) *BOWEL PROBLEMS (unusual diarrhea, constipation, pain near the anus) TENDERNESS IN MOUTH AND THROAT WITH OR WITHOUT PRESENCE OF ULCERS (sore throat, sores in mouth, or a toothache) UNUSUAL RASH, SWELLING OR PAIN  UNUSUAL VAGINAL DISCHARGE OR ITCHING   Items with * indicate a potential emergency and should be followed up as soon as possible or go to the Emergency Department if any problems should occur.  Please show the CHEMOTHERAPY ALERT CARD or IMMUNOTHERAPY ALERT CARD at check-in to the Emergency Department and triage nurse.  Should you have questions after your visit or need to cancel or reschedule your appointment, please contact [facility] [phone]  and follow the prompts.  Office hours are 8:00 a.m. to 4:30 p.m. Monday - Friday. Please note that voicemails left after 4:00 p.m. may not be returned until the following business day.  We are closed weekends and major holidays. You have access to a nurse at all times for urgent questions. Please call the main number to the clinic [phone] and follow the prompts.  For any non-urgent questions, you may also contact your provider using MyChart. We now offer e-Visits for anyone 18 and older to request care online for non-urgent symptoms. For details visit [email or web address].   Also download the MyChart app! Go to the app store, search "MyChart", open the app, select [facility], and log in with your MyChart username and password.  Masks are optional in the cancer centers. If you would like for your care team to wear a mask while they are taking care of you, please let them know. You may have one support person who is at least 71 years old accompany you for your appointments.  

## 2022-10-25 DIAGNOSIS — Z944 Liver transplant status: Secondary | ICD-10-CM | POA: Diagnosis not present

## 2022-11-02 ENCOUNTER — Inpatient Hospital Stay: Payer: Medicare Other

## 2022-11-02 VITALS — BP 149/66 | HR 70 | Temp 96.4°F | Resp 18

## 2022-11-02 DIAGNOSIS — D500 Iron deficiency anemia secondary to blood loss (chronic): Secondary | ICD-10-CM | POA: Diagnosis not present

## 2022-11-02 DIAGNOSIS — D508 Other iron deficiency anemias: Secondary | ICD-10-CM

## 2022-11-02 DIAGNOSIS — K922 Gastrointestinal hemorrhage, unspecified: Secondary | ICD-10-CM | POA: Diagnosis not present

## 2022-11-02 MED ORDER — SODIUM CHLORIDE 0.9 % IV SOLN
300.0000 mg | Freq: Once | INTRAVENOUS | Status: AC
  Administered 2022-11-02: 300 mg via INTRAVENOUS
  Filled 2022-11-02: qty 300

## 2022-11-02 MED ORDER — SODIUM CHLORIDE 0.9 % IV SOLN: Freq: Once | INTRAVENOUS | Status: AC

## 2022-11-02 NOTE — Progress Notes (Signed)
Patient presents today for Venofer 300 mg iron infusion. Patient took Tylenol 650 mg and Claritin 10 mg  at 07:45 am prior to arrival. Vital signs stable. Patient denies any significant changes since her last infusion. MAR reviewed and updated.   Venofer 300 mg given today per MD orders. Tolerated infusion without adverse affects. Vital signs stable. No complaints at this time. Discharged from clinic ambulatory in stable condition. Alert and oriented x 3. F/U with [facility] as scheduled.

## 2022-11-02 NOTE — Patient Instructions (Signed)
[facility]  Discharge Instructions: Thank you for choosing [facility] to provide your oncology and hematology care.  If you have a lab appointment with the Cancer Center, please come in thru the Main Entrance and check in at the main information desk.  Wear comfortable clothing and clothing appropriate for easy access to any Portacath or PICC line.   We strive to give you quality time with your provider. You may need to reschedule your appointment if you arrive late (15 or more minutes).  Arriving late affects you and other patients whose appointments are after yours.  Also, if you miss three or more appointments without notifying the office, you may be dismissed from the clinic at the provider's discretion.      For prescription refill requests, have your pharmacy contact our office and allow 72 hours for refills to be completed.    Today you received the following chemotherapy and/or immunotherapy agents Venofer 300 mg. Iron Sucrose Injection What is this medication? IRON SUCROSE (EYE ern SOO krose) treats low levels of iron (iron deficiency anemia) in people with kidney disease. Iron is a mineral that plays an important role in making red blood cells, which carry oxygen from your lungs to the rest of your body. This medicine may be used for other purposes; ask your health care provider or pharmacist if you have questions. COMMON BRAND NAME(S): Venofer What should I tell my care team before I take this medication? They need to know if you have any of these conditions: Anemia not caused by low iron levels Heart disease High levels of iron in the blood Kidney disease Liver disease An unusual or allergic reaction to iron, other medications, foods, dyes, or preservatives Pregnant or trying to get pregnant Breastfeeding How should I use this medication? This medication is for infusion into a vein. It is given in a hospital or clinic setting. Talk to your  care team about the use of this medication in children. While this medication may be prescribed for children as young as 2 years for selected conditions, precautions do apply. Overdosage: If you think you have taken too much of this medicine contact a poison control center or emergency room at once. NOTE: This medicine is only for you. Do not share this medicine with others. What if I miss a dose? Keep appointments for follow-up doses. It is important not to miss your dose. Call your care team if you are unable to keep an appointment. What may interact with this medication? Do not take this medication with any of the following: Deferoxamine Dimercaprol Other iron products This medication may also interact with the following: Chloramphenicol Deferasirox This list may not describe all possible interactions. Give your health care provider a list of all the medicines, herbs, non-prescription drugs, or dietary supplements you use. Also tell them if you smoke, drink alcohol, or use illegal drugs. Some items may interact with your medicine. What should I watch for while using this medication? Visit your care team regularly. Tell your care team if your symptoms do not start to get better or if they get worse. You may need blood work done while you are taking this medication. You may need to follow a special diet. Talk to your care team. Foods that contain iron include: whole grains/cereals, dried fruits, beans, or peas, leafy green vegetables, and organ meats (liver, kidney). What side effects may I notice from receiving this medication? Side effects that you should report to   your care team as soon as possible: Allergic reactions--skin rash, itching, hives, swelling of the face, lips, tongue, or throat Low blood pressure--dizziness, feeling faint or lightheaded, blurry vision Shortness of breath Side effects that usually do not require medical attention (report to your care team if they continue or are  bothersome): Flushing Headache Joint pain Muscle pain Nausea Pain, redness, or irritation at injection site This list may not describe all possible side effects. Call your doctor for medical advice about side effects. You may report side effects to FDA at 1-800-FDA-1088. Where should I keep my medication? This medication is given in a hospital or clinic and will not be stored at home. NOTE: This sheet is a summary. It may not cover all possible information. If you have questions about this medicine, talk to your doctor, pharmacist, or health care provider.  2023 Elsevier/Gold Standard (2021-02-09 00:00:00)       To help prevent nausea and vomiting after your treatment, we encourage you to take your nausea medication as directed.  BELOW ARE SYMPTOMS THAT SHOULD BE REPORTED IMMEDIATELY: *FEVER GREATER THAN 100.4 F (38 C) OR HIGHER *CHILLS OR SWEATING *NAUSEA AND VOMITING THAT IS NOT CONTROLLED WITH YOUR NAUSEA MEDICATION *UNUSUAL SHORTNESS OF BREATH *UNUSUAL BRUISING OR BLEEDING *URINARY PROBLEMS (pain or burning when urinating, or frequent urination) *BOWEL PROBLEMS (unusual diarrhea, constipation, pain near the anus) TENDERNESS IN MOUTH AND THROAT WITH OR WITHOUT PRESENCE OF ULCERS (sore throat, sores in mouth, or a toothache) UNUSUAL RASH, SWELLING OR PAIN  UNUSUAL VAGINAL DISCHARGE OR ITCHING   Items with * indicate a potential emergency and should be followed up as soon as possible or go to the Emergency Department if any problems should occur.  Please show the CHEMOTHERAPY ALERT CARD or IMMUNOTHERAPY ALERT CARD at check-in to the Emergency Department and triage nurse.  Should you have questions after your visit or need to cancel or reschedule your appointment, please contact [facility] [phone]  and follow the prompts.  Office hours are 8:00 a.m. to 4:30 p.m. Monday - Friday. Please note that voicemails left after 4:00 p.m. may not be returned until  the following business day.  We are closed weekends and major holidays. You have access to a nurse at all times for urgent questions. Please call the main number to the clinic [phone] and follow the prompts.  For any non-urgent questions, you may also contact your provider using MyChart. We now offer e-Visits for anyone 18 and older to request care online for non-urgent symptoms. For details visit [email or web address].   Also download the MyChart app! Go to the app store, search "MyChart", open the app, select [place], and log in with your MyChart username and password.  Masks are optional in the cancer centers. If you would like for your care team to wear a mask while they are taking care of you, please let them know. You may have one support person who is at least 71 years old accompany you for your appointments.  

## 2022-11-07 DIAGNOSIS — D849 Immunodeficiency, unspecified: Secondary | ICD-10-CM | POA: Diagnosis not present

## 2022-11-07 DIAGNOSIS — Z944 Liver transplant status: Secondary | ICD-10-CM | POA: Diagnosis not present

## 2022-11-08 ENCOUNTER — Ambulatory Visit (INDEPENDENT_AMBULATORY_CARE_PROVIDER_SITE_OTHER): Payer: Medicare Other | Admitting: Gastroenterology

## 2022-11-13 ENCOUNTER — Other Ambulatory Visit (INDEPENDENT_AMBULATORY_CARE_PROVIDER_SITE_OTHER): Payer: Self-pay | Admitting: Gastroenterology

## 2022-11-13 DIAGNOSIS — R1013 Epigastric pain: Secondary | ICD-10-CM

## 2022-11-13 DIAGNOSIS — K219 Gastro-esophageal reflux disease without esophagitis: Secondary | ICD-10-CM

## 2022-11-15 DIAGNOSIS — L570 Actinic keratosis: Secondary | ICD-10-CM | POA: Diagnosis not present

## 2022-11-15 DIAGNOSIS — D485 Neoplasm of uncertain behavior of skin: Secondary | ICD-10-CM | POA: Diagnosis not present

## 2022-11-15 DIAGNOSIS — Z1509 Genetic susceptibility to other malignant neoplasm: Secondary | ICD-10-CM | POA: Diagnosis not present

## 2022-11-15 DIAGNOSIS — C44529 Squamous cell carcinoma of skin of other part of trunk: Secondary | ICD-10-CM | POA: Diagnosis not present

## 2022-11-15 DIAGNOSIS — C44622 Squamous cell carcinoma of skin of right upper limb, including shoulder: Secondary | ICD-10-CM | POA: Diagnosis not present

## 2022-11-15 DIAGNOSIS — D0471 Carcinoma in situ of skin of right lower limb, including hip: Secondary | ICD-10-CM | POA: Diagnosis not present

## 2022-11-19 ENCOUNTER — Encounter: Payer: Self-pay | Admitting: Hematology

## 2022-11-26 ENCOUNTER — Encounter (INDEPENDENT_AMBULATORY_CARE_PROVIDER_SITE_OTHER): Payer: Self-pay | Admitting: Gastroenterology

## 2022-11-26 ENCOUNTER — Ambulatory Visit (INDEPENDENT_AMBULATORY_CARE_PROVIDER_SITE_OTHER): Payer: Medicare Other | Admitting: Gastroenterology

## 2022-11-26 ENCOUNTER — Encounter (INDEPENDENT_AMBULATORY_CARE_PROVIDER_SITE_OTHER): Payer: Self-pay | Admitting: *Deleted

## 2022-11-26 ENCOUNTER — Encounter: Payer: Self-pay | Admitting: Hematology

## 2022-11-26 VITALS — BP 145/80 | HR 60 | Temp 97.5°F | Ht 64.0 in | Wt 146.7 lb

## 2022-11-26 DIAGNOSIS — R748 Abnormal levels of other serum enzymes: Secondary | ICD-10-CM

## 2022-11-26 DIAGNOSIS — K31819 Angiodysplasia of stomach and duodenum without bleeding: Secondary | ICD-10-CM | POA: Diagnosis not present

## 2022-11-26 DIAGNOSIS — Z1509 Genetic susceptibility to other malignant neoplasm: Secondary | ICD-10-CM | POA: Diagnosis not present

## 2022-11-26 DIAGNOSIS — Z944 Liver transplant status: Secondary | ICD-10-CM

## 2022-11-26 NOTE — Patient Instructions (Addendum)
Ask liver transplant team if it would be OK to increase ursodiol to 900 mg every day for PBC Follow up with liver transplant team Proceed with previously ordered blood workup today Schedule flexible sigmoidoscopy in 02/2023 If low iron stores, will need to repeat EGD The patient was found to have elevated blood pressure when vital signs were checked in the office. The blood pressure was rechecked by the nursing staff and it was found be persistently elevated >140/90 mmHg. I personally advised to the patient to follow up closely with PCP for hypertension control.

## 2022-11-26 NOTE — Progress Notes (Signed)
Cassidy Barber, M.D. Gastroenterology & Hepatology [facility] [address] [place], [place] [address]  Primary Care Physician: [name], [place] [address]  I will communicate my assessment and recommendations to the referring MD via EMR.  Problems: GAVE PBC status post liver transplant Lynch syndrome   History of Present Illness: Cassidy Barber is a 72 y.o. female with past medical history of PBC status post liver transplant, GAVE complicated by iron deficiency anemia, Lynch syndrome s/p proctocolectomy, hypertension, hypothyroidism, history of colon cancer, multiple skin cancers, who presents for follow up of Lynch syndrome, GAVE and PBC.  The patient was last seen on 05/21/2022. At that time, the patient was scheduled for an EGD with finding described below.  Patient reports feeling well and denies any complaint although sometimes she feels a little fatigued.  The patient denies having any nausea, vomiting, fever, chills, hematochezia, melena, hematemesis, abdominal distention, abdominal pain, diarrhea, jaundice, pruritus or weight loss.  Patient follows with Dr. Merrilee Jansky at [facility] for follow up for her liver transplant. She had living donor donation from her son - she was transplanted 9 years ago at [facility]. She is due for repeat blood workup in 1 month. She is taking ursodiol 250 mg BID and also on everolimus.   Most recent blood workup from 09/25/2022 showed CBC with WBC 4.9, platelets 380, hemoglobin 13.4 CMP with alkaline phosphatase 150, AST 27, ALT 215, normal renal function and electrolytes.  Most recent abdominal imaging was not endoscopic mucosal resection MRCP on 08/20/2022 which showed cystic lesion in pancreatic body concerning for possible sidebranch IPMN, no other abnormalities in the liver.  Last EGD: 06/04/2022, normal esophagus, mild portal hypertensive gastropathy, moderate GAVE ablated with APC,  normal duodenum.  Last flex sig: 01/09/22 - healthy anastomosis at 20 cm from the anus. There was ulcerated mucosa in the distal rectum with pathology reports consistent with ulceration and ulcers slough. She was advised to have a repeat flexible sigmoidoscopy in 1 year.   Past Medical History: Past Medical History:  Diagnosis Date   Abdominal wall hernia    Incarcerated status post surgical repair 2019 - [facility]   Anemia of chronic disease    Atypical nevus 01/21/2018   atypical neoplasm- Left scalp-ant (txpbx + MOHS), atypical neoplasm- Left scalp post- (txpbx + MOHS)   Basal cell carcinoma    Colon cancer ([place])    Colon surgery 2005 and 2012   History of pulmonary hypertension    Pre liver transplant   Hypertension    Hypothyroidism    Lynch syndrome    Osteopenia    Primary biliary cirrhosis ([place])    Status post liver transplantation - follows at [facility]   SCCA (squamous cell carcinoma) of skin 02/23/2020   Right Upper Chest(moderate) (MOH's)   SCCA (squamous cell carcinoma) of skin 04/07/2020   Left Top Leg (Keratoacanthoma) treatment after biopsy   SCCA (squamous cell carcinoma) of skin 04/07/2020   Left Foot Dorsal (in situ) treatment after biopsy   SCCA (squamous cell carcinoma) of skin 03/27/2021   Right Upper Back (Keratoacanthoma) (excision) (clear)   SCCA (squamous cell carcinoma) of skin 06/13/2021   Right Shoulder - anterior (moderately differentiated) (tx p bx)   SCCA (squamous cell carcinoma) of skin 06/13/2021   Right Thigh - anterior (well differentiated) (tx p bx)   SCCA (squamous cell carcinoma) of skin 06/13/2021   Right Lower Leg - anterior (well differentiated) (tx p bx)  Squamous cell carcinoma of skin 04/22/2018   KA-Right mid chest (txpbx), KA-left elbow crease (txpbx), insitu-Right mid chest inf. (exc)   Squamous cell carcinoma of skin 05/20/2018   well diff-Left upper shin (txpbx), well diff-Right lower forearm (txpbx), well diff-Right upper shin (txpbx)    Squamous cell carcinoma of skin 06/11/2018   Scc + margin-Right mid chest inferior    Squamous cell carcinoma of skin 06/27/2018   well diff-Left mid thigh(txpbx), well diff-Left inner thigh (txpbx),insitu-Right cheek (txpbx),well diff-right inner heel (txpbx)   Squamous cell carcinoma of skin 09/16/2018   well diff-left shoulder (txpbx), well diff-Right chin (txpbx), well diff-right chest lateral (CX35FU)   Squamous cell carcinoma of skin 10/01/2018   Right outer lower shin (Txpbx)   Squamous cell carcinoma of skin 04/03/2019   well diff-Right center chest (MOHS), in situ-Right ear   Squamous cell carcinoma of skin 08/05/2019   in situ-Left calf (txpbx), in situ-left bicep (txpbx), well diff-Left chest,inf(txpbx), in situ-Right chest inf-(txpbx)   Squamous cell carcinoma of skin 11/19/2019   KA-left top leg (txpbx), modify-Riight forehead-(mohs), in situ-right hand (txpbx), in situ-Right forearm (txpbx), well diff-Right chest (txpbx), well diff-chin (txpbx)   Squamous cell carcinoma of skin 01/06/2020   KA- Left top leg   Squamous cell carcinoma of skin 05/12/2013   bowens-middle of chest (CX35FU)   Squamous cell carcinoma of skin 05/18/2015   well diff-Left upper arm (CX35FU + Exc),KA-right chest(txpbx), in situ-Left shin (txpbx), well diff-Right cheek (CX35FU), KA-Left post scalp (CX35FU)   Squamous cell carcinoma of skin 08/09/2015   KA-Left post scalp ((MOHS), in situ- mid chest (Txpbx +exc), in situ-Left upper arm inferior (txpbx)   Squamous cell carcinoma of skin 10/13/2015   Left upper arm-clear   Squamous cell carcinoma of skin 03/09/2016   mod diff-mid chest (txpbx+ exc), mod diff-Right chest (txpbx+exc), well diff-right cheek-(txpbx),well diff-Left hand-(txpbx), in situ-Left upper arm (txpbx), well diff-Right cheek -(txpbx), well diff-Right crease arm (txpbx)    Squamous cell carcinoma of skin 05/24/2016   well diff-Right nasal crease-(MOHS)   Squamous cell carcinoma of skin  08/02/2016   KA-Left chest med (txpbx)   Squamous cell carcinoma of skin 08/30/2016   in situ-Left outer zygoma (txpbx)   Squamous cell carcinoma of skin 12/06/2016   well diff-Left chest sup, Left shoudler, insitu- right post scalp   Squamous cell carcinoma of skin 02/14/2017   well diff-Left forearm (EXC),in situ-RIght ant neck   Squamous cell carcinoma of skin 06/14/2017   in situ-Right forearm (txpbx), in situ-Right chest (txpbx), well diff-left chest (txpbx), well diff-anterior neck- (txpbx)   Squamous cell carcinoma of skin 08/07/2017   well diff-Left upper shoulder (txpbx), sup and invasive-Left temple (txpbx), well diff-Right upper shin (txpbx), in situ-Right clavicle (txpbx)   Squamous cell carcinoma of skin 10/17/2017   well diff-ant. neck (MOHS), in situ-Right chest, inf (txpbx)   Squamous cell carcinoma of skin 01/21/2018   well diff- Right chest,ulnar (txpbx), well diff- right upper chest (txpbx), in situ-Right ant. crown (txpbx)   Squamous cell carcinoma of skin 08/02/2020   well diff-left lower leg-inferior (Txpbx)   Squamous cell carcinoma of skin 08/02/2020   well diff-right lower leg-mid (txpbx)   Squamous cell carcinoma of skin 08/02/2020   well diff-left chest upper   Squamous cell carcinoma of skin 08/02/2020   well diff-mid parietal scalp (MOHS)   Squamous cell carcinoma of skin 08/02/2020   well diff-right foot inner(txpbx)   Squamous cell carcinoma of skin 08/02/2020  well diff- left lower leg medial (txpbx)   Squamous cell carcinoma of skin 08/02/2020   well diff-left lower leg anterior (txpbx)   Squamous cell carcinoma of skin 08/02/2020   well diff-left lower leg medial (txpbx)   Squamous cell carcinoma of skin 08/02/2020   well diff-right forearm-posterior (txpbx)    Past Surgical History: Past Surgical History:  Procedure Laterality Date   ABDOMINAL HERNIA REPAIR     Patient's states that she has had 8- 9 hernia surgeries   ABDOMINAL HYSTERECTOMY      BIOPSY  02/15/2021   Procedure: BIOPSY;  Surgeon: Rogene Houston, MD;  [facility];  Service: Endoscopy;;   BIOPSY  12/20/2021   Procedure: BIOPSY;  Surgeon: Rogene Houston, MD;  [facility];  Service: Endoscopy;;   CATARACT EXTRACTION W/PHACO Right 01/15/2020   Procedure: CATARACT EXTRACTION PHACO AND INTRAOCULAR LENS PLACEMENT ([place]);  Surgeon: Baruch Goldmann, MD;  [facility]: [facility];  Service: Ophthalmology;  Laterality: Right;  CDE: 7.89   CATARACT EXTRACTION W/PHACO Left 01/29/2020   Procedure: CATARACT EXTRACTION PHACO AND INTRAOCULAR LENS PLACEMENT (IOC) (CDE: 6.33);  Surgeon: Baruch Goldmann, MD;  Location: [facility];  Service: Ophthalmology;  Laterality: Left;   CHOLECYSTECTOMY  2007   COLON SURGERY  2008   Done at [facility]   COLONOSCOPY     Done at [facility]   ESOPHAGOGASTRODUODENOSCOPY N/A 08/21/2018   Procedure: ESOPHAGOGASTRODUODENOSCOPY (EGD);  Surgeon: Rogene Houston, MD;  [facility];  Service: Endoscopy;  Laterality: N/A;   ESOPHAGOGASTRODUODENOSCOPY (EGD) WITH PROPOFOL N/A 12/16/2019   Procedure: ESOPHAGOGASTRODUODENOSCOPY (EGD) WITH PROPOFOL;  Surgeon: Rogene Houston, MD;  [facility];  Service: Endoscopy;  Laterality: N/A;   ESOPHAGOGASTRODUODENOSCOPY (EGD) WITH PROPOFOL N/A 12/20/2021   Procedure: ESOPHAGOGASTRODUODENOSCOPY (EGD) WITH PROPOFOL;  Surgeon: Rogene Houston, MD;  [facility];  Service: Endoscopy;  Laterality: N/A;  9:05   ESOPHAGOGASTRODUODENOSCOPY (EGD) WITH PROPOFOL N/A 02/14/2022   Procedure: ESOPHAGOGASTRODUODENOSCOPY (EGD) WITH PROPOFOL;  Surgeon: Rogene Houston, MD;  [facility];  Service: Endoscopy;  Laterality: N/A;  730   ESOPHAGOGASTRODUODENOSCOPY (EGD) WITH PROPOFOL N/A 06/04/2022   Procedure: ESOPHAGOGASTRODUODENOSCOPY (EGD) WITH PROPOFOL;  Surgeon: Harvel Quale, MD;  [facility];  Service: Gastroenterology;  Laterality: N/A;  145   EYE SURGERY     lasix    FLEXIBLE SIGMOIDOSCOPY N/A 10/20/2015   Procedure: FLEXIBLE SIGMOIDOSCOPY;  Surgeon: Rogene Houston, MD;  [facility];  Service: Endoscopy;  Laterality: N/A;  48 - Dr Laural Golden has meeting until 1:00   FLEXIBLE SIGMOIDOSCOPY N/A 07/11/2016   Procedure: FLEXIBLE SIGMOIDOSCOPY;  Surgeon: Rogene Houston, MD;  [facility];  Service: Endoscopy;  Laterality: N/A;  [place] N/A 08/09/2017   Procedure: FLEXIBLE SIGMOIDOSCOPY;  Surgeon: Rogene Houston, MD;  [facility];  Service: Endoscopy;  Laterality: N/A;  1:00   FLEXIBLE SIGMOIDOSCOPY N/A 08/21/2018   Procedure: FLEXIBLE SIGMOIDOSCOPY;  Surgeon: Rogene Houston, MD;  [facility];  Service: Endoscopy;  Laterality: N/A;   FLEXIBLE SIGMOIDOSCOPY N/A 12/16/2019   Procedure: FLEXIBLE SIGMOIDOSCOPY wirh Propofol;  Surgeon: Rogene Houston, MD;  [facility];  Service: Endoscopy;  Laterality: N/A;  7:30   FLEXIBLE SIGMOIDOSCOPY N/A 02/15/2021   Procedure: FLEXIBLE SIGMOIDOSCOPY WITH PROPOFOL;  Surgeon: Rogene Houston, MD;  [facility];  Service: Endoscopy;  Laterality: N/A;  am   FLEXIBLE SIGMOIDOSCOPY N/A  12/20/2021   Procedure: FLEXIBLE SIGMOIDOSCOPY;  Surgeon: Rogene Houston, MD;  [facility];  Service: Endoscopy;  Laterality: N/A;   FRACTURE SURGERY     right wrist metal plate   GI RADIOFREQUENCY ABLATION N/A 02/14/2022   Procedure: GI RADIOFREQUENCY ABLATION;  Surgeon: Rogene Houston, MD;  [facility];  Service: Endoscopy;  Laterality: N/A;   HOT HEMOSTASIS N/A 12/20/2021   Procedure: HOT HEMOSTASIS (ARGON PLASMA COAGULATION/BICAP);  Surgeon: Rogene Houston, MD;  [facility];  Service: Endoscopy;  Laterality: N/A;   HOT HEMOSTASIS  06/04/2022   Procedure: HOT HEMOSTASIS (ARGON PLASMA COAGULATION/BICAP);  Surgeon: Montez Morita, Quillian Quince, MD;  [facility];  Service: Gastroenterology;;   LIVER TRANSPLANT   11/19/2013   POLYPECTOMY  08/09/2017   Procedure: POLYPECTOMY;  Surgeon: Rogene Houston, MD;  [facility];  Service: Endoscopy;;  colon small bowel   POLYPECTOMY N/A 02/14/2022   Procedure: POLYPECTOMY;  Surgeon: Rogene Houston, MD;  [facility];  Service: Endoscopy;  Laterality: N/A;   REVERSE SHOULDER ARTHROPLASTY Left 07/17/2018   Procedure: LEFT REVERSE SHOULDER ARTHROPLASTY;  Surgeon: Justice Britain, MD;  Location: [place];  Service: Orthopedics;  Laterality: Left;  1101mn   REVERSE SHOULDER ARTHROPLASTY Right 07/20/2021   Procedure: REVERSE SHOULDER ARTHROPLASTY;  Surgeon: SJustice Britain MD;  [facility];  Service: Orthopedics;  Laterality: Right;   SHOULDER CLOSED REDUCTION Left 09/27/2019   Procedure: CLOSED REDUCTION SHOULDER;  Surgeon: OParalee Cancel MD;  Location: [facility];  Service: Orthopedics;  Laterality: Left;   SPLENECTOMY  2006   TOTAL SHOULDER REVISION Left 11/12/2019   Procedure: Revision Left Reverse Shoulder Arthroplasty with poly exchange SDD;  Surgeon: SJustice Britain MD;  Location: [facility];  Service: Orthopedics;  Laterality: Left;  1228m -SDDC   TYMPANOSTOMY TUBE PLACEMENT     UPPER GASTROINTESTINAL ENDOSCOPY     [facility]  Family History: Family History  Problem Relation Age of Onset   Prostate cancer Father    Colon cancer Father    Colon cancer Sister    Lung cancer Sister    Healthy Son    Alcohol abuse Brother    Allergic rhinitis Neg Hx    Asthma Neg Hx    Eczema Neg Hx    Urticaria Neg Hx     Social History: Social History   Tobacco Use  Smoking Status Never  Smokeless Tobacco Never   Social History   Substance and Sexual Activity  Alcohol Use No   Alcohol/week: 0.0 standard drinks of alcohol   Social History   Substance and Sexual Activity  Drug Use No    Allergies: Allergies  Allergen Reactions   Ciprofloxacin Itching   Codeine Nausea Only    Medications: Current Outpatient Medications   Medication Sig Dispense Refill   acetaminophen (TYLENOL) 500 MG tablet Take 1,000 mg by mouth every 6 (six) hours as needed (for pain.).      alendronate (FOSAMAX) 70 MG tablet TAKE 1 TABLET EVERY WEEK (Patient taking differently: Take 70 mg by mouth every Thursday. TAKE 1 TABLET EVERY WEEK) 12 tablet 2   ALPRAZolam (XANAX) 0.5 MG tablet Take 1 tablet (0.5 mg total) by mouth at bedtime. 90 tablet 1   Ascorbic Acid (VITAMIN C) 500 MG CHEW Chew 1,000 mg by mouth daily.     Biotin 1000 MCG tablet Take 1,000 mcg by mouth in the morning and at bedtime.  Biotin w/ Vitamins C & E (HAIR SKIN & NAILS GUMMIES PO) Take 1 tablet by mouth in the morning and at bedtime.     Calcium Citrate-Vitamin D (CALCIUM CITRATE + D3 PO) Take 1 tablet by mouth 2 (two) times daily.     cetirizine (ZYRTEC) 10 MG tablet Take 1 tablet (10 mg total) by mouth daily as needed for allergies. 90 tablet 3   Clobetasol Prop Emollient Base (CLOBETASOL PROPIONATE E) 0.05 % emollient cream Apply 1 application. topically 2 (two) times daily. 180 g 1   clotrimazole-betamethasone (LOTRISONE) cream Apply 1 application topically 2 (two) times daily. (Patient taking differently: Apply 1 application  topically daily as needed (Rash).) 30 g 0   Cyanocobalamin (VITAMIN B-12) 5000 MCG TBDP Take 5,000 mcg by mouth 2 (two) times a week.     cyclobenzaprine (FLEXERIL) 10 MG tablet Take 1 tablet (10 mg total) by mouth 3 (three) times daily as needed for muscle spasms. 30 tablet 1   famotidine (PEPCID) 20 MG tablet Take 1 tablet (20 mg total) by mouth 2 (two) times daily. 60 tablet 5   fluorouracil (EFUDEX) 5 % cream Apply 1 application topically daily as needed (cancer spots).   0   fluticasone (FLONASE) 50 MCG/ACT nasal spray Place 2 sprays into both nostrils daily. 16 g 6   gabapentin (NEURONTIN) 100 MG capsule Take 1 capsule (100 mg total) by mouth daily. 90 capsule 0   levothyroxine (SYNTHROID) 112 MCG tablet Take 1 tablet (112 mcg total) by  mouth daily. 90 tablet 4   losartan (COZAAR) 25 MG tablet Take 1 tablet (25 mg total) by mouth daily. 90 tablet 4   metoprolol succinate (TOPROL XL) 25 MG 24 hr tablet Take 1 tablet (25 mg total) by mouth daily. 90 tablet 4   Multiple Vitamins-Minerals (MULTIVITAMIN WITH MINERALS) tablet Take 1 tablet by mouth daily.     mupirocin ointment (BACTROBAN) 2 % Apply 1 application topically daily as needed (Cancer spots). 22 g 3   NIACINAMIDE-ZINC-FOLIC ACID PO Take 2 tablets by mouth 2 (two) times daily.     pantoprazole (PROTONIX) 40 MG tablet TAKE ONE (1) TABLET BY MOUTH TWO (2) TIMES DAILY 180 tablet 0   silver sulfADIAZINE (SILVADENE) 1 % cream Apply 1 Application topically daily. Apply to large surface area once to twice daily (Patient taking differently: Apply 1 Application topically daily as needed (skin irritation). Apply to large surface area once to twice daily) 400 g 1   ursodiol (ACTIGALL) 250 MG tablet Take 250 mg by mouth 2 (two) times daily.      Vitamin D, Ergocalciferol, (DRISDOL) 1.25 MG (50000 UNIT) CAPS capsule TAKE 1 CAPSULE BY MOUTH ONCE A WEEK (Patient taking differently: Take 50,000 Units by mouth every Tuesday.) 12 capsule 3   vitamin E 180 MG (400 UNITS) capsule Take 400 Units by mouth daily.     ZORTRESS 0.5 MG TABS Take 4 tablets (2 mg total) by mouth 2 (two) times daily. 180 tablet 2   No current facility-administered medications for this visit.    Review of Systems: GENERAL: negative for malaise, night sweats HEENT: No changes in hearing or vision, no nose bleeds or other nasal problems. NECK: Negative for lumps, goiter, pain and significant neck swelling RESPIRATORY: Negative for cough, wheezing CARDIOVASCULAR: Negative for chest pain, leg swelling, palpitations, orthopnea GI: SEE HPI MUSCULOSKELETAL: Negative for joint pain or swelling, back pain, and muscle pain. SKIN: Negative for lesions, rash PSYCH: Negative for sleep disturbance, mood  disorder and recent  psychosocial stressors. HEMATOLOGY Negative for prolonged bleeding, bruising easily, and swollen nodes. ENDOCRINE: Negative for cold or heat intolerance, polyuria, polydipsia and goiter. NEURO: negative for tremor, gait imbalance, syncope and seizures. The remainder of the review of systems is noncontributory.   Physical Exam: BP (!) 145/80 (BP Location: Left Arm, Patient Position: Sitting, Cuff Size: Small)   Pulse 60   Temp (!) 97.5 F (36.4 C) (Temporal)   Ht '5\' 4"'$  (1.626 m)   Wt 146 lb 11.2 oz (66.5 kg)   BMI 25.18 kg/m  GENERAL: The patient is AO x3, in no acute distress. HEENT: Head is normocephalic and atraumatic. EOMI are intact. Mouth is well hydrated and without lesions. NECK: Supple. No masses LUNGS: Clear to auscultation. No presence of rhonchi/wheezing/rales. Adequate chest expansion HEART: RRR, normal s1 and s2. ABDOMEN: Soft, nontender, no guarding, no peritoneal signs, and nondistended. BS +. No masses. EXTREMITIES: Without any cyanosis, clubbing, rash, lesions or edema. NEUROLOGIC: AOx3, no focal motor deficit. SKIN: no jaundice, no rashes  Imaging/Labs: as above  I personally reviewed and interpreted the available labs, imaging and endoscopic files.  Impression and Plan: Cassidy Barber is a 72 y.o. female with past medical history of PBC status post liver transplant, GAVE complicated by iron deficiency anemia, Lynch syndrome s/p proctocolectomy, hypertension, hypothyroidism, history of colon cancer, multiple skin cancers, who presents for follow up of Lynch syndrome, GAVE and PBC.  Regarding her PBC and GAVE, the patient has been asymptomatic and has not presented any complaints.  She was transplanted multiple years ago and has been followed by a liver transplant at [facility].  More recently she was found to have mild elevation of her alkaline phosphatase.  It is unclear if this is related to recurrent PBC in her transplant liver.  She is currently on ursodiol 250 mg twice  a day.  We discussed that her previous prescription was printed for 500 mg twice a day but she is currently taking only 1 pill twice daily.  I asked the patient to ask the liver transplant team if it was possible to increase her dosage to 900 mg every day (so she will be at a dosage of 12 to 15 mg/kg).  She should follow the rest of the treatment as advised by the liver transplant team.  In terms of her gait, she has not presented any clinical bleeding and her most recent hemoglobin was stable.  She received IV iron infusion recently.  May consider repeating an EGD if her repeat iron stores showed low stores.  She will notify us when she has this testing performed.  Finally, she should undergo repeat flexible sigmoidoscopy in April 2024 given her history of Lynch syndrome and is status post proctocolectomy.  The patient was found to have elevated blood pressure when vital signs were checked in the office. The blood pressure was rechecked by the nursing staff and it was found be persistently elevated >140/90 mmHg. I personally advised to the patient to follow up closely with PCP for hypertension control.  - Patient will ask liver transplant team if it would be OK to increase ursodiol to 900 mg every day for PBC - Follow up with liver transplant team - Proceed with CBC, CMP and iron stores check - Schedule flexible sigmoidoscopy in 02/2023 - If low iron stores, will need to repeat EGD  All questions were answered.      Cassidy Peppers, MD Gastroenterology and Hepatology [facility]

## 2022-11-27 ENCOUNTER — Telehealth (INDEPENDENT_AMBULATORY_CARE_PROVIDER_SITE_OTHER): Payer: Self-pay

## 2022-11-27 NOTE — Telephone Encounter (Signed)
Patient called to confirm her Cassidy Barber is 250 mg one in the am and one QHS.

## 2022-11-27 NOTE — Telephone Encounter (Signed)
Did she ask her transplant doctors if they agreed on increasing the dose to 900 mg every day? Thanks

## 2022-11-28 NOTE — Telephone Encounter (Signed)
I spoke with the patient she says she has sent a message to the transplant doc's and is awaiting a return call from them, she will let us know what they tell her.

## 2022-11-29 ENCOUNTER — Other Ambulatory Visit (INDEPENDENT_AMBULATORY_CARE_PROVIDER_SITE_OTHER): Payer: Self-pay | Admitting: Gastroenterology

## 2022-11-29 DIAGNOSIS — L570 Actinic keratosis: Secondary | ICD-10-CM | POA: Diagnosis not present

## 2022-11-29 DIAGNOSIS — K743 Primary biliary cirrhosis: Secondary | ICD-10-CM

## 2022-11-29 DIAGNOSIS — L578 Other skin changes due to chronic exposure to nonionizing radiation: Secondary | ICD-10-CM | POA: Diagnosis not present

## 2022-11-29 MED ORDER — URSODIOL 300 MG PO CAPS: 900.0000 mg | ORAL_CAPSULE | Freq: Every day | ORAL | 5 refills | Status: DC

## 2022-11-29 NOTE — Telephone Encounter (Signed)
Patient called back and she said she spoke with transplant doctor and she reports it is ok to take ursodiol '900mg'$ . She wanted a 30 day supply instead of 90 sent to the drug store in [place] due to cost of med she rather have 30 days.

## 2022-11-29 NOTE — Telephone Encounter (Signed)
Medication sent, she should stop the 250 and take 900 mg daily in AM. If having some intolerance to this dose, can split it 600 mg in AM and 300 at night. Thanks

## 2022-11-29 NOTE — Telephone Encounter (Signed)
Discussed with patient and she verbalized understanding.  

## 2022-12-04 ENCOUNTER — Telehealth: Payer: Self-pay | Admitting: *Deleted

## 2022-12-04 DIAGNOSIS — Z944 Liver transplant status: Secondary | ICD-10-CM | POA: Diagnosis not present

## 2022-12-04 DIAGNOSIS — H1013 Acute atopic conjunctivitis, bilateral: Secondary | ICD-10-CM | POA: Diagnosis not present

## 2022-12-04 NOTE — Telephone Encounter (Signed)
Placed in April recall to call patient and schedule

## 2022-12-04 NOTE — Telephone Encounter (Signed)
Received triage on pt. Per Dr. Jenetta Downer OV note 11/26/22 "Schedule flexible sigmoidoscopy in 02/2023". We are not schedule for April yet. Ann Please add for that recall. Thanks!  Referring MD/PCP: [name]  Procedure: flex sig  Reason/Indication:  recall  Has patient had this procedure before?  yes  If so, when, by whom and where?    Is there a family history of colon cancer?  yes  Who?  What age when diagnosed?    Is patient diabetic? If yes, Type 1 or Type 2   no      Does patient have prosthetic heart valve or mechanical valve?  no  Do you have a pacemaker/defibrillator?  no  Has patient ever had endocarditis/atrial fibrillation? no  Does patient use oxygen? no  Has patient had joint replacement within last 12 months?  no  Is patient constipated or do they take laxatives? no  Does patient have a history of alcohol/drug use?  no  Have you had a stroke/heart attack last 6 mths? no  Do you take medicine for weight loss?  no  For female patients,: have you had a hysterectomy yes                      are you post menopausal no                      do you still have your menstrual cycle no  Is patient on blood thinner such as Coumadin, Plavix and/or Aspirin? no  Medications:  Current Outpatient Medications on File Prior to Visit  Medication Sig Dispense Refill   acetaminophen (TYLENOL) 500 MG tablet Take 1,000 mg by mouth every 6 (six) hours as needed (for pain.).      alendronate (FOSAMAX) 70 MG tablet TAKE 1 TABLET EVERY WEEK (Patient taking differently: Take 70 mg by mouth every Thursday. TAKE 1 TABLET EVERY WEEK) 12 tablet 2   ALPRAZolam (XANAX) 0.5 MG tablet Take 1 tablet (0.5 mg total) by mouth at bedtime. 90 tablet 1   Ascorbic Acid (VITAMIN C) 500 MG CHEW Chew 1,000 mg by mouth daily.     Biotin 1000 MCG tablet Take 1,000 mcg by mouth in the morning and at bedtime.     Biotin w/ Vitamins C & E (HAIR SKIN & NAILS GUMMIES PO) Take 1 tablet by mouth in the  morning and at bedtime.     Calcium Citrate-Vitamin D (CALCIUM CITRATE + D3 PO) Take 1 tablet by mouth 2 (two) times daily.     cetirizine (ZYRTEC) 10 MG tablet Take 1 tablet (10 mg total) by mouth daily as needed for allergies. 90 tablet 3   Clobetasol Prop Emollient Base (CLOBETASOL PROPIONATE E) 0.05 % emollient cream Apply 1 application. topically 2 (two) times daily. 180 g 1   clotrimazole-betamethasone (LOTRISONE) cream Apply 1 application topically 2 (two) times daily. (Patient taking differently: Apply 1 application  topically daily as needed (Rash).) 30 g 0   Cyanocobalamin (VITAMIN B-12) 5000 MCG TBDP Take 5,000 mcg by mouth 2 (two) times a week.     cyclobenzaprine (FLEXERIL) 10 MG tablet Take 1 tablet (10 mg total) by mouth 3 (three) times daily as needed for muscle spasms. 30 tablet 1   famotidine (PEPCID) 20 MG tablet Take 1 tablet (20 mg total) by mouth 2 (two) times daily. 60 tablet 5   fluorouracil (EFUDEX) 5 % cream Apply 1 application topically daily as needed (cancer spots).  0   fluticasone (FLONASE) 50 MCG/ACT nasal spray Place 2 sprays into both nostrils daily. 16 g 6   gabapentin (NEURONTIN) 100 MG capsule Take 1 capsule (100 mg total) by mouth daily. 90 capsule 0   levothyroxine (SYNTHROID) 112 MCG tablet Take 1 tablet (112 mcg total) by mouth daily. 90 tablet 4   losartan (COZAAR) 25 MG tablet Take 1 tablet (25 mg total) by mouth daily. 90 tablet 4   metoprolol succinate (TOPROL XL) 25 MG 24 hr tablet Take 1 tablet (25 mg total) by mouth daily. 90 tablet 4   Multiple Vitamins-Minerals (MULTIVITAMIN WITH MINERALS) tablet Take 1 tablet by mouth daily.     mupirocin ointment (BACTROBAN) 2 % Apply 1 application topically daily as needed (Cancer spots). 22 g 3   NIACINAMIDE-ZINC-FOLIC ACID PO Take 2 tablets by mouth 2 (two) times daily.     pantoprazole (PROTONIX) 40 MG tablet TAKE ONE (1) TABLET BY MOUTH TWO (2) TIMES DAILY 180 tablet 0   silver sulfADIAZINE (SILVADENE) 1 %  cream Apply 1 Application topically daily. Apply to large surface area once to twice daily (Patient taking differently: Apply 1 Application topically daily as needed (skin irritation). Apply to large surface area once to twice daily) 400 g 1   ursodiol (ACTIGALL) 300 MG capsule Take 3 capsules (900 mg total) by mouth daily. 90 capsule 5   Vitamin D, Ergocalciferol, (DRISDOL) 1.25 MG (50000 UNIT) CAPS capsule TAKE 1 CAPSULE BY MOUTH ONCE A WEEK (Patient taking differently: Take 50,000 Units by mouth every Tuesday.) 12 capsule 3   vitamin E 180 MG (400 UNITS) capsule Take 400 Units by mouth daily.     ZORTRESS 0.5 MG TABS Take 4 tablets (2 mg total) by mouth 2 (two) times daily. 180 tablet 2   No current facility-administered medications on file prior to visit.     Allergies:  Allergies  Allergen Reactions   Ciprofloxacin Itching   Codeine Nausea Only     [facility] in [place]

## 2022-12-10 DIAGNOSIS — H524 Presbyopia: Secondary | ICD-10-CM | POA: Diagnosis not present

## 2022-12-18 DIAGNOSIS — R238 Other skin changes: Secondary | ICD-10-CM | POA: Diagnosis not present

## 2022-12-18 DIAGNOSIS — Z1509 Genetic susceptibility to other malignant neoplasm: Secondary | ICD-10-CM | POA: Diagnosis not present

## 2022-12-18 DIAGNOSIS — Z1283 Encounter for screening for malignant neoplasm of skin: Secondary | ICD-10-CM | POA: Diagnosis not present

## 2022-12-18 DIAGNOSIS — C44629 Squamous cell carcinoma of skin of left upper limb, including shoulder: Secondary | ICD-10-CM | POA: Diagnosis not present

## 2022-12-18 DIAGNOSIS — C44729 Squamous cell carcinoma of skin of left lower limb, including hip: Secondary | ICD-10-CM | POA: Diagnosis not present

## 2022-12-18 DIAGNOSIS — D485 Neoplasm of uncertain behavior of skin: Secondary | ICD-10-CM | POA: Diagnosis not present

## 2022-12-19 ENCOUNTER — Other Ambulatory Visit: Payer: Self-pay | Admitting: Family

## 2022-12-21 DIAGNOSIS — D849 Immunodeficiency, unspecified: Secondary | ICD-10-CM | POA: Diagnosis not present

## 2022-12-25 DIAGNOSIS — Z944 Liver transplant status: Secondary | ICD-10-CM | POA: Diagnosis not present

## 2022-12-31 ENCOUNTER — Encounter: Payer: Self-pay | Admitting: Family Medicine

## 2022-12-31 ENCOUNTER — Telehealth (INDEPENDENT_AMBULATORY_CARE_PROVIDER_SITE_OTHER): Payer: Medicare Other | Admitting: Family Medicine

## 2022-12-31 DIAGNOSIS — J0110 Acute frontal sinusitis, unspecified: Secondary | ICD-10-CM | POA: Diagnosis not present

## 2022-12-31 MED ORDER — PREDNISONE 10 MG (21) PO TBPK: ORAL_TABLET | ORAL | 0 refills | Status: DC

## 2022-12-31 MED ORDER — AMOXICILLIN-POT CLAVULANATE 875-125 MG PO TABS: 1.0000 | ORAL_TABLET | Freq: Two times a day (BID) | ORAL | 0 refills | Status: AC

## 2022-12-31 NOTE — Progress Notes (Signed)
   Virtual Visit via video Note   Due to COVID-19 pandemic this visit was conducted virtually. This visit type was conducted due to national recommendations for restrictions regarding the COVID-19 Pandemic (e.g. social distancing, sheltering in place) in an effort to limit this patient's exposure and mitigate transmission in our community. All issues noted in this document were discussed and addressed.  A physical exam was not performed with this format.  I connected with  Cassidy Barber  on 12/31/22 at 10:17 by video and verified that I am speaking with the correct person using two identifiers. Cassidy Barber is currently located at home and no one is currently with her during the visit. The provider, Gwenlyn Perking, FNP is located in their office at time of visit.  I discussed the limitations, risks, security and privacy concerns of performing an evaluation and management service by video  and the availability of in person appointments. I also discussed with the patient that there may be a patient responsible charge related to this service. The patient expressed understanding and agreed to proceed.  CC: sinusitis  History and Present Illness:  Sinus Pain: Patient complains of congestion, facial pain, post nasal drip, and frontal headache . Denies cough, fever, chills, night sweats or weight loss. Onset of symptoms was 3 weeks ago, unchanged since that time. She is drinking plenty of fluids.  Past history is significant for no history of pneumonia or bronchitis. Patient is non-smoker   ROS As per HPI.   Observations/Objective: Alert and oriented x 3. Non toxic appearing. Respirations unlabored. Able to speak in full sentences without difficulty. Normal mood and behavior.    Assessment and Plan: Cassidy Barber was seen today for sinusitis.  Diagnoses and all orders for this visit:  Acute non-recurrent frontal sinusitis Augmentin as below. Discussed symptomatic care and return precautions.  -      amoxicillin-clavulanate (AUGMENTIN) 875-125 MG tablet; Take 1 tablet by mouth 2 (two) times daily for 7 days. -     predniSONE (STERAPRED UNI-PAK 21 TAB) 10 MG (21) TBPK tablet; Use as directed on back of pill pack     Follow Up Instructions: As needed.     I discussed the assessment and treatment plan with the patient. The patient was provided an opportunity to ask questions and all were answered. The patient agreed with the plan and demonstrated an understanding of the instructions.   The patient was advised to call back or seek an in-person evaluation if the symptoms worsen or if the condition fails to improve as anticipated.  The above assessment and management plan was discussed with the patient. The patient verbalized understanding of and has agreed to the management plan. Patient is aware to call the clinic if symptoms persist or worsen. Patient is aware when to return to the clinic for a follow-up visit. Patient educated on when it is appropriate to go to the emergency department.   Time call ended: 1024  I provided 7 minutes of face-to-face time during this encounter.    Gwenlyn Perking, FNP

## 2023-01-01 NOTE — Addendum Note (Signed)
Addended by: Charlyne Petrin B on: 01/01/2023 09:05 AM   Modules accepted: Orders

## 2023-01-04 ENCOUNTER — Inpatient Hospital Stay: Payer: Medicare Other

## 2023-01-04 DIAGNOSIS — D638 Anemia in other chronic diseases classified elsewhere: Secondary | ICD-10-CM | POA: Diagnosis not present

## 2023-01-04 DIAGNOSIS — D508 Other iron deficiency anemias: Secondary | ICD-10-CM | POA: Diagnosis not present

## 2023-01-04 DIAGNOSIS — Z944 Liver transplant status: Secondary | ICD-10-CM | POA: Diagnosis not present

## 2023-01-09 NOTE — Progress Notes (Signed)
VIRTUAL VISIT via [place]   I connected with Cassidy Barber  on 01/10/23 at  1:09 PM by telephone and verified that I am speaking with the correct person using two identifiers.  Location: Patient: Home Provider: [facility]   I discussed the limitations, risks, security and privacy concerns of performing an evaluation and management service by telephone and the availability of in person appointments. I also discussed with the patient that there may be a patient responsible charge related to this service. The patient expressed understanding and agreed to proceed.  REASON FOR VISIT: Iron deficiency anemia   CURRENT THERAPY: Intermittent IV iron  INTERVAL HISTORY:  [name] is contacted today for follow-up of her iron deficiency anemia.  She was last seen in clinic by Tarri Abernethy PA-C on 10/09/2022.  She received Venofer 300 mg x 3 in December 2023, and reports feeling unchanged after IV iron.  She continues to have fatigue with energy that "comes and goes," currently at 50%.  She continues to have ongoing loose, dark bowel movements which have been chronic ever since her colectomy.  This is unchanged, despite most recent EGD/APC treatment of GAVE on 06/04/2022. No gross hematochezia, epistaxis, or other source of blood loss.  She been having some mild dyspnea on exertion.  No pica, restless legs, chest pain, lightheadedness, or syncope.  She reports 50% energy and 100% appetite.  She is maintaining a stable weight at this time.  REVIEW OF SYSTEMS:   Review of Systems  Constitutional:  Positive for malaise/fatigue. Negative for chills, diaphoresis, fever and weight loss.  Respiratory:  Negative for cough and shortness of breath.   Cardiovascular:  Negative for chest pain and palpitations.  Gastrointestinal:  Negative for abdominal pain, blood in stool, melena, nausea and vomiting.  Neurological:  Negative for dizziness and  headaches.     PHYSICAL EXAM: (per limitations of virtual telephone visit)  The patient is alert and oriented x 3, exhibiting adequate mentation, good mood, and ability to speak in full sentences and execute sound judgement.  ASSESSMENT & PLAN:  1.  Iron deficiency anemia from occult GI blood loss: - Unable to tolerate oral iron. - Secondary to history of colon cancer s/p abdominal colectomy, malabsorption, and chronic blood loss from GAVE -  Most recent EGD (06/04/2022) showed GAVE without bleeding, treated with APC. - Most recent sigmoidoscopy (12/20/2021): Mucosal ulceration in distal rectum, end-to-end ileocolonic anastomosis characterized by healthy-appearing mucosa - SPEP negative in 2019 - She last received IV iron (Venofer 300 mg x 3) in December 2023  - Currently symptomatic with worsening fatigue and mild DOE - Dark stools ever since her colectomy.  Denies any epistaxis or rectal hemorrhage - Most recent labs (01/04/2023 via [place]): Hgb 12.1/MCV 89, ferritin 212, iron saturation 32% - PLAN: No indication for IV iron at that time - Labs in 3 months, phone visit 1 week after  - Continue follow-up with GI (Dr. Jenetta Downer) for management of any GI blood loss   2.  Vitamin B12 deficiency - Taking Vitamin B12 tablet (2000 mcg) once daily - Labs from 10/09/2022 show normal B12 599, normal MMA 184 - PLAN: Continue B12 supplements at home.  We will recheck labs annually (Barber 2024)  3.  Liver Transplant: - Had this completed at [facility] in January 2015.  - She is on immunosuppression with everolimus.   4.  Skin cancer on her scalp: - Secondary to chronic immunosuppression  for liver transplant; lynch syndrome - She is status post 30 treatments of radiation to her scalp. - She is followed by dermatology and has had several areas treated recently with topical creams.   5.  History of colon cancer with [facility] SYNDROME: - Status post abdominal colectomy on 09/17/2011 with simultaneous  total abdominal hysterectomy and bilateral salpingo-oophorectomy. - Found to have Lynch syndrome. - Per recommendations she is to have an EGD every 2 to 3 years and annual colonoscopy for review of her residual colon tissue at the anastomosis. - Sigmoidoscopy (02/15/2021): Two nonbleeding erosions at ileocolonic anastomosis, small external hemorrhoids  PLAN SUMMARY: >> Labs in 3 months (to be drawn at [facility] via [facility]): CBC/D, ferritin, iron/TIBC >> PHONE visit in 3 months (1 to 2 days after labs)     I discussed the assessment and treatment plan with the patient. The patient was provided an opportunity to ask questions and all were answered. The patient agreed with the plan and demonstrated an understanding of the instructions.   The patient was advised to call back or seek an in-person evaluation if the symptoms worsen or if the condition fails to improve as anticipated.  I provided 18 minutes of non-face-to-face time during this encounter.  Harriett Rush, PA-C 01/10/23 1:37 PM

## 2023-01-10 ENCOUNTER — Inpatient Hospital Stay: Payer: Medicare Other | Attending: Hematology | Admitting: Physician Assistant

## 2023-01-10 ENCOUNTER — Ambulatory Visit (INDEPENDENT_AMBULATORY_CARE_PROVIDER_SITE_OTHER): Payer: Medicare Other | Admitting: Family

## 2023-01-10 ENCOUNTER — Encounter: Payer: Self-pay | Admitting: Physician Assistant

## 2023-01-10 ENCOUNTER — Encounter: Payer: Self-pay | Admitting: Family

## 2023-01-10 VITALS — Wt 144.0 lb

## 2023-01-10 VITALS — BP 126/77 | HR 66 | Temp 97.4°F | Ht 64.0 in | Wt 144.2 lb

## 2023-01-10 DIAGNOSIS — J019 Acute sinusitis, unspecified: Secondary | ICD-10-CM

## 2023-01-10 DIAGNOSIS — F411 Generalized anxiety disorder: Secondary | ICD-10-CM

## 2023-01-10 DIAGNOSIS — Z79899 Other long term (current) drug therapy: Secondary | ICD-10-CM | POA: Diagnosis not present

## 2023-01-10 DIAGNOSIS — Z0001 Encounter for general adult medical examination with abnormal findings: Secondary | ICD-10-CM | POA: Diagnosis not present

## 2023-01-10 DIAGNOSIS — D500 Iron deficiency anemia secondary to blood loss (chronic): Secondary | ICD-10-CM | POA: Diagnosis not present

## 2023-01-10 DIAGNOSIS — R5383 Other fatigue: Secondary | ICD-10-CM | POA: Diagnosis not present

## 2023-01-10 DIAGNOSIS — Z944 Liver transplant status: Secondary | ICD-10-CM | POA: Diagnosis not present

## 2023-01-10 DIAGNOSIS — D692 Other nonthrombocytopenic purpura: Secondary | ICD-10-CM

## 2023-01-10 DIAGNOSIS — E039 Hypothyroidism, unspecified: Secondary | ICD-10-CM

## 2023-01-10 DIAGNOSIS — F1320 Sedative, hypnotic or anxiolytic dependence, uncomplicated: Secondary | ICD-10-CM | POA: Diagnosis not present

## 2023-01-10 DIAGNOSIS — D509 Iron deficiency anemia, unspecified: Secondary | ICD-10-CM | POA: Diagnosis not present

## 2023-01-10 DIAGNOSIS — Z0000 Encounter for general adult medical examination without abnormal findings: Secondary | ICD-10-CM

## 2023-01-10 MED ORDER — ALPRAZOLAM 0.5 MG PO TABS: 0.5000 mg | ORAL_TABLET | Freq: Every day | ORAL | 1 refills | Status: DC

## 2023-01-10 MED ORDER — DOXYCYCLINE HYCLATE 100 MG PO TABS: 100.0000 mg | ORAL_TABLET | Freq: Two times a day (BID) | ORAL | 0 refills | Status: DC

## 2023-01-10 NOTE — Patient Instructions (Signed)
Health Maintenance After Age 72 After age 72, you are at a higher risk for certain long-term diseases and infections as well as injuries from falls. Falls are a major cause of broken bones and head injuries in people who are older than age 72. Getting regular preventive care can help to keep you healthy and well. Preventive care includes getting regular testing and making lifestyle changes as recommended by your health care provider. Talk with your health care provider about: Which screenings and tests you should have. A screening is a test that checks for a disease when you have no symptoms. A diet and exercise plan that is right for you. What should I know about screenings and tests to prevent falls? Screening and testing are the best ways to find a health problem early. Early diagnosis and treatment give you the best chance of managing medical conditions that are common after age 72. Certain conditions and lifestyle choices may make you more likely to have a fall. Your health care provider may recommend: Regular vision checks. Poor vision and conditions such as cataracts can make you more likely to have a fall. If you wear glasses, make sure to get your prescription updated if your vision changes. Medicine review. Work with your health care provider to regularly review all of the medicines you are taking, including over-the-counter medicines. Ask your health care provider about any side effects that may make you more likely to have a fall. Tell your health care provider if any medicines that you take make you feel dizzy or sleepy. Strength and balance checks. Your health care provider may recommend certain tests to check your strength and balance while standing, walking, or changing positions. Foot health exam. Foot pain and numbness, as well as not wearing proper footwear, can make you more likely to have a fall. Screenings, including: Osteoporosis screening. Osteoporosis is a condition that causes  the bones to get weaker and break more easily. Blood pressure screening. Blood pressure changes and medicines to control blood pressure can make you feel dizzy. Depression screening. You may be more likely to have a fall if you have a fear of falling, feel depressed, or feel unable to do activities that you used to do. Alcohol use screening. Using too much alcohol can affect your balance and may make you more likely to have a fall. Follow these instructions at home: Lifestyle Do not drink alcohol if: Your health care provider tells you not to drink. If you drink alcohol: Limit how much you have to: 0-1 drink a day for women. 0-2 drinks a day for men. Know how much alcohol is in your drink. In the [place] one drink equals one 12 oz bottle of beer (355 mL), one 5 oz glass of wine (148 mL), or one 1 oz glass of hard liquor (44 mL). Do not use any products that contain nicotine or tobacco. These products include cigarettes, chewing tobacco, and vaping devices, such as e-cigarettes. If you need help quitting, ask your health care provider. Activity  Follow a regular exercise program to stay fit. This will help you maintain your balance. Ask your health care provider what types of exercise are appropriate for you. If you need a cane or walker, use it as recommended by your health care provider. Wear supportive shoes that have nonskid soles. Safety  Remove any tripping hazards, such as rugs, cords, and clutter. Install safety equipment such as grab bars in bathrooms and safety rails on stairs. Keep rooms and walkways   well-lit. General instructions Talk with your health care provider about your risks for falling. Tell your health care provider if: You fall. Be sure to tell your health care provider about all falls, even ones that seem minor. You feel dizzy, tiredness (fatigue), or off-balance. Take over-the-counter and prescription medicines only as told by your health care provider. These include  supplements. Eat a healthy diet and maintain a healthy weight. A healthy diet includes low-fat dairy products, low-fat (lean) meats, and fiber from whole grains, beans, and lots of fruits and vegetables. Stay current with your vaccines. Schedule regular health, dental, and eye exams. Summary Having a healthy lifestyle and getting preventive care can help to protect your health and wellness after age 72. Screening and testing are the best way to find a health problem early and help you avoid having a fall. Early diagnosis and treatment give you the best chance for managing medical conditions that are more common for people who are older than age 72. Falls are a major cause of broken bones and head injuries in people who are older than age 72. Take precautions to prevent a fall at home. Work with your health care provider to learn what changes you can make to improve your health and wellness and to prevent falls. This information is not intended to replace advice given to you by your health care provider. Make sure you discuss any questions you have with your health care provider. Document Revised: 03/20/2021 Document Reviewed: 03/20/2021 Elsevier Patient Education  2023 Elsevier Inc.  

## 2023-01-10 NOTE — Progress Notes (Signed)
Subjective:    Patient ID: Cassidy Barber, female    DOB: [birth year], 72 y.o.   MRN: [id number]  Chief Complaint  Patient presents with   Medical Management of Chronic Issues   Cough   PT presents to the office today for CPE and Chronic follow up.  She is followed by [facility] for hx of liver transplant. She had an incarcerated ventral hernia with fluid in the hernia sac 04/05/18. She has been diagnosed with GAVE.    She is followed by Hemologists every 3 months for iron deficiency anemia.      She had a left reverse shoulder 07/17/18 and then had to redone in 09/27/19. She is scheduled for reverse shoulder arthroplasty on 07/20/21 on her right shoulder.    She currently has skin cancer on her scalp and is completed 30 radiation treatments then had 20 radiation treatments on another.  She is followed by dermatologists for recurrent squamous skin cancer on legs, arms, and face. Has purpura senilis. Stable.    She has Lynch syndrome.  Hypertension This is a chronic problem. The current episode started more than 1 year ago. The problem has been waxing and waning since onset. The problem is uncontrolled. Associated symptoms include headaches and malaise/fatigue. Pertinent negatives include no peripheral edema or shortness of breath. Risk factors for coronary artery disease include dyslipidemia, obesity and sedentary lifestyle. The current treatment provides moderate improvement.  Gastroesophageal Reflux She complains of belching, heartburn, a hoarse voice and a sore throat. This is a chronic problem. The current episode started more than 1 year ago. The problem occurs rarely. She has tried a PPI for the symptoms. The treatment provided moderate relief.  Sinusitis This is a new problem. The current episode started more than 1 month ago. The problem has been gradually worsening since onset. There has been no fever. Her pain is at a severity of 7/10. The pain is moderate. Associated symptoms include  congestion, headaches, a hoarse voice, sinus pressure, sneezing and a sore throat. Pertinent negatives include no ear pain or shortness of breath. Past treatments include antibiotics, acetaminophen and oral decongestants. The treatment provided mild relief.      Review of Systems  Constitutional:  Positive for malaise/fatigue.  HENT:  Positive for congestion, hoarse voice, sinus pressure, sneezing and sore throat. Negative for ear pain.   Respiratory:  Negative for shortness of breath.   Gastrointestinal:  Positive for heartburn.  Neurological:  Positive for headaches.  All other systems reviewed and are negative.      Objective:   Physical Exam Vitals reviewed.  Constitutional:      General: She is not in acute distress.    Appearance: She is well-developed.  HENT:     Head: Normocephalic and atraumatic.     Right Ear: Tympanic membrane normal.     Left Ear: Tympanic membrane normal.  Eyes:     Pupils: Pupils are equal, round, and reactive to light.  Neck:     Thyroid: No thyromegaly.  Cardiovascular:     Rate and Rhythm: Normal rate and regular rhythm.     Heart sounds: Normal heart sounds. No murmur heard. Pulmonary:     Effort: Pulmonary effort is normal. No respiratory distress.     Breath sounds: Normal breath sounds. No wheezing.  Abdominal:     General: Bowel sounds are normal. There is no distension.     Palpations: Abdomen is soft.     Tenderness: There is no abdominal tenderness.  Musculoskeletal:        General: No tenderness. Normal range of motion.     Cervical back: Normal range of motion and neck supple.  Skin:    General: Skin is warm and dry.  Neurological:     Mental Status: She is alert and oriented to person, place, and time.     Cranial Nerves: No cranial nerve deficit.     Deep Tendon Reflexes: Reflexes are normal and symmetric.  Psychiatric:        Behavior: Behavior normal.        Thought Content: Thought content normal.        Judgment:  Judgment normal.       BP 126/77   Pulse 66   Temp (!) 97.4 F (36.3 C) (Temporal)   Ht '5\' 4"'$  (1.626 m)   Wt 144 lb 3.2 oz (65.4 kg)   BMI 24.75 kg/m      Assessment & Plan:   IVYONA BRUSKY comes in today with chief complaint of Medical Management of Chronic Issues and Cough   Diagnosis and orders addressed:  1. GAD (generalized anxiety disorder) - ALPRAZolam (XANAX) 0.5 MG tablet; Take 1 tablet (0.5 mg total) by mouth at bedtime.  Dispense: 90 tablet; Refill: 1  2. Benzodiazepine dependence (HCC) - ALPRAZolam (XANAX) 0.5 MG tablet; Take 1 tablet (0.5 mg total) by mouth at bedtime.  Dispense: 90 tablet; Refill: 1  3. Controlled substance agreement signed - ALPRAZolam (XANAX) 0.5 MG tablet; Take 1 tablet (0.5 mg total) by mouth at bedtime.  Dispense: 90 tablet; Refill: 1  4. Annual physical exam - Lipid panel - TSH - Vitamin B12 - Iron, TIBC and Ferritin Panel  5. Hypothyroidism, unspecified type  6. Hx of liver transplant ([place]) - TSH  7. Purpura senilis ([place])  8. Other fatigue - TSH - Vitamin B12 - Iron, TIBC and Ferritin Panel  9. Iron deficiency anemia, unspecified iron deficiency anemia type - Iron, TIBC and Ferritin Panel  10. Acute sinusitis, recurrence not specified, unspecified location - Take meds as prescribed - Use a cool mist humidifier  -Use saline nose sprays frequently -Force fluids -For any cough or congestion  Use plain Mucinex- regular strength or max strength is fine -For fever or aces or pains- take tylenol or ibuprofen. -Throat lozenges if help -Follow up if symptoms worsen or do not improve  - doxycycline (VIBRA-TABS) 100 MG tablet; Take 1 tablet (100 mg total) by mouth 2 (two) times daily.  Dispense: 20 tablet; Refill: 0   Labs pending Health Maintenance reviewed Diet and exercise encouraged  Follow up plan: 6 months    [name], FNP

## 2023-01-11 ENCOUNTER — Telehealth: Payer: Medicare Other | Admitting: Physician Assistant

## 2023-01-11 LAB — LIPID PANEL
Chol/HDL Ratio: 2.7 ratio (ref 0.0–4.4)
Cholesterol, Total: 199 mg/dL (ref 100–199)
HDL: 75 mg/dL (ref >39)
LDL Chol Calc (NIH): 78 mg/dL (ref 0–99)
Triglycerides: 285 mg/dL — ABNORMAL HIGH (ref 0–149)
VLDL Cholesterol Cal: 46 mg/dL — ABNORMAL HIGH (ref 5–40)

## 2023-01-11 LAB — TSH: TSH: 1.82 u[IU]/mL (ref 0.450–4.500)

## 2023-01-11 LAB — VITAMIN B12: Vitamin B-12: 1563 pg/mL — ABNORMAL HIGH (ref 232–1245)

## 2023-01-17 NOTE — Telephone Encounter (Signed)
Thanks

## 2023-01-22 DIAGNOSIS — C4402 Squamous cell carcinoma of skin of lip: Secondary | ICD-10-CM | POA: Diagnosis not present

## 2023-01-22 DIAGNOSIS — D485 Neoplasm of uncertain behavior of skin: Secondary | ICD-10-CM | POA: Diagnosis not present

## 2023-01-22 DIAGNOSIS — C44629 Squamous cell carcinoma of skin of left upper limb, including shoulder: Secondary | ICD-10-CM | POA: Diagnosis not present

## 2023-01-22 DIAGNOSIS — C44729 Squamous cell carcinoma of skin of left lower limb, including hip: Secondary | ICD-10-CM | POA: Diagnosis not present

## 2023-01-22 DIAGNOSIS — Z1509 Genetic susceptibility to other malignant neoplasm: Secondary | ICD-10-CM | POA: Diagnosis not present

## 2023-01-22 NOTE — Telephone Encounter (Signed)
Room 3 Please set her up for EGD and flex sig . Diagnosis for EGD is GAVE and Iron def anemia. Does not need prep, only tap water enemas x2

## 2023-01-22 NOTE — Telephone Encounter (Signed)
Pt contacted and ready to schedule flex sig for April. Please advise what room. Pt also is wanting to know if we are going to do EGD and flex sig at same time. Pt states they usually do it at the same time. Please advise. Thank you  (Pt request Suprep.)

## 2023-01-23 NOTE — Telephone Encounter (Signed)
Pt contacted and flex sig/EGD scheduled for 02/26/23. Instructions will be mailed to patient. No prep per Dr.Castaneda, only tap water enema x2. Will contact pt with pre op appt.

## 2023-01-28 ENCOUNTER — Ambulatory Visit: Payer: Medicare Other | Admitting: Family

## 2023-01-28 DIAGNOSIS — L509 Urticaria, unspecified: Secondary | ICD-10-CM

## 2023-01-28 MED ORDER — CETIRIZINE HCL 10 MG PO TABS: 10.0000 mg | ORAL_TABLET | Freq: Every day | ORAL | 3 refills | Status: AC | PRN

## 2023-01-29 ENCOUNTER — Telehealth (INDEPENDENT_AMBULATORY_CARE_PROVIDER_SITE_OTHER): Payer: Self-pay | Admitting: *Deleted

## 2023-01-29 ENCOUNTER — Other Ambulatory Visit: Payer: Medicare Other

## 2023-01-29 ENCOUNTER — Encounter (INDEPENDENT_AMBULATORY_CARE_PROVIDER_SITE_OTHER): Payer: Self-pay | Admitting: *Deleted

## 2023-01-29 DIAGNOSIS — Z944 Liver transplant status: Secondary | ICD-10-CM | POA: Diagnosis not present

## 2023-01-29 NOTE — Telephone Encounter (Signed)
I don't think we do, she stated she had gotten a letter from them letting her know he was retiring and gave the name of the one taking his place and gave a number for her to call but she thought you might could take it over. I will let her know she will need to stay there. Thanks

## 2023-01-29 NOTE — Telephone Encounter (Signed)
This encounter was created in error - please disregard.

## 2023-01-29 NOTE — Telephone Encounter (Signed)
Yes, we can take care locally of her non transplant issues, but she should follow with the suggested provider at [facility] - she should call and make that appointment. Thanks

## 2023-01-29 NOTE — Telephone Encounter (Signed)
Patient aware.

## 2023-01-29 NOTE — Telephone Encounter (Signed)
Unfortunately, we do not manage the post transplant medicines in our clinic, so this will need to be managed by the transplant center at [facility]. Do we need to send a repeat referral to [facility] so one of their transplant providers sees her or can we ask them to transfer the care from dr. Merrilee Jansky to one of their providers? Thanks

## 2023-01-29 NOTE — Telephone Encounter (Signed)
Patient called in, she has been seeing Dr Laurier Nancy at [facility] since her liver transplant in 2015 at [facility] - Dr Merrilee Jansky is retiring and she is asking if you would be able to take over her care for the S/P liver transplant - please advise

## 2023-01-31 ENCOUNTER — Telehealth (INDEPENDENT_AMBULATORY_CARE_PROVIDER_SITE_OTHER): Payer: Self-pay | Admitting: Gastroenterology

## 2023-01-31 NOTE — Telephone Encounter (Signed)
Pt contacted with pre op appt information. Pre op 02/21/23 10:30 at [facility]

## 2023-02-13 DIAGNOSIS — Z944 Liver transplant status: Secondary | ICD-10-CM | POA: Diagnosis not present

## 2023-02-13 DIAGNOSIS — Z79899 Other long term (current) drug therapy: Secondary | ICD-10-CM | POA: Diagnosis not present

## 2023-02-13 DIAGNOSIS — T8649 Other complications of liver transplant: Secondary | ICD-10-CM | POA: Diagnosis not present

## 2023-02-13 DIAGNOSIS — Y830 Surgical operation with transplant of whole organ as the cause of abnormal reaction of the patient, or of later complication, without mention of misadventure at the time of the procedure: Secondary | ICD-10-CM | POA: Diagnosis not present

## 2023-02-13 DIAGNOSIS — K831 Obstruction of bile duct: Secondary | ICD-10-CM | POA: Diagnosis not present

## 2023-02-13 DIAGNOSIS — Z1509 Genetic susceptibility to other malignant neoplasm: Secondary | ICD-10-CM | POA: Diagnosis not present

## 2023-02-13 DIAGNOSIS — Z4823 Encounter for aftercare following liver transplant: Secondary | ICD-10-CM | POA: Diagnosis not present

## 2023-02-13 DIAGNOSIS — Z23 Encounter for immunization: Secondary | ICD-10-CM | POA: Diagnosis not present

## 2023-02-13 DIAGNOSIS — D849 Immunodeficiency, unspecified: Secondary | ICD-10-CM | POA: Diagnosis not present

## 2023-02-13 DIAGNOSIS — D84821 Immunodeficiency due to drugs: Secondary | ICD-10-CM | POA: Diagnosis not present

## 2023-02-14 DIAGNOSIS — K080 Exfoliation of teeth due to systemic causes: Secondary | ICD-10-CM | POA: Diagnosis not present

## 2023-02-21 ENCOUNTER — Encounter (HOSPITAL_COMMUNITY): Payer: Self-pay

## 2023-02-21 ENCOUNTER — Other Ambulatory Visit: Payer: Self-pay

## 2023-02-21 ENCOUNTER — Encounter (HOSPITAL_COMMUNITY)
Admission: RE | Admit: 2023-02-21 | Discharge: 2023-02-21 | Disposition: A | Discharge status: Home / Self Care | Payer: Medicare Other | Source: Ambulatory Visit | Attending: Gastroenterology | Admitting: Gastroenterology

## 2023-02-26 ENCOUNTER — Ambulatory Visit (HOSPITAL_COMMUNITY)
Admission: RE | Admit: 2023-02-26 | Discharge: 2023-02-26 | Disposition: A | Discharge status: Home / Self Care | Payer: Medicare Other | Attending: Gastroenterology | Admitting: Gastroenterology

## 2023-02-26 ENCOUNTER — Encounter (HOSPITAL_COMMUNITY): Payer: Self-pay | Admitting: Gastroenterology

## 2023-02-26 ENCOUNTER — Other Ambulatory Visit: Payer: Self-pay

## 2023-02-26 ENCOUNTER — Ambulatory Visit (HOSPITAL_BASED_OUTPATIENT_CLINIC_OR_DEPARTMENT_OTHER): Payer: Medicare Other | Admitting: Anesthesiology

## 2023-02-26 ENCOUNTER — Encounter (HOSPITAL_COMMUNITY)
Admission: RE | Disposition: A | Discharge status: Home / Self Care | Payer: Self-pay | Source: Home / Self Care | Attending: Gastroenterology

## 2023-02-26 ENCOUNTER — Ambulatory Visit (HOSPITAL_COMMUNITY): Payer: Medicare Other | Admitting: Anesthesiology

## 2023-02-26 DIAGNOSIS — Z79623 Long term (current) use of mammalian target of rapamycin (mtor) inhibitor: Secondary | ICD-10-CM | POA: Insufficient documentation

## 2023-02-26 DIAGNOSIS — Z79890 Hormone replacement therapy (postmenopausal): Secondary | ICD-10-CM | POA: Insufficient documentation

## 2023-02-26 DIAGNOSIS — K449 Diaphragmatic hernia without obstruction or gangrene: Secondary | ICD-10-CM | POA: Diagnosis not present

## 2023-02-26 DIAGNOSIS — K648 Other hemorrhoids: Secondary | ICD-10-CM | POA: Insufficient documentation

## 2023-02-26 DIAGNOSIS — I10 Essential (primary) hypertension: Secondary | ICD-10-CM | POA: Diagnosis not present

## 2023-02-26 DIAGNOSIS — D638 Anemia in other chronic diseases classified elsewhere: Secondary | ICD-10-CM

## 2023-02-26 DIAGNOSIS — D509 Iron deficiency anemia, unspecified: Secondary | ICD-10-CM | POA: Insufficient documentation

## 2023-02-26 DIAGNOSIS — K31819 Angiodysplasia of stomach and duodenum without bleeding: Secondary | ICD-10-CM

## 2023-02-26 DIAGNOSIS — Z944 Liver transplant status: Secondary | ICD-10-CM | POA: Diagnosis not present

## 2023-02-26 DIAGNOSIS — Z800 Family history of malignant neoplasm of digestive organs: Secondary | ICD-10-CM | POA: Diagnosis not present

## 2023-02-26 DIAGNOSIS — Z980 Intestinal bypass and anastomosis status: Secondary | ICD-10-CM | POA: Diagnosis not present

## 2023-02-26 DIAGNOSIS — Z85038 Personal history of other malignant neoplasm of large intestine: Secondary | ICD-10-CM

## 2023-02-26 DIAGNOSIS — M8580 Other specified disorders of bone density and structure, unspecified site: Secondary | ICD-10-CM | POA: Insufficient documentation

## 2023-02-26 DIAGNOSIS — Z79899 Other long term (current) drug therapy: Secondary | ICD-10-CM | POA: Insufficient documentation

## 2023-02-26 DIAGNOSIS — I272 Pulmonary hypertension, unspecified: Secondary | ICD-10-CM | POA: Diagnosis not present

## 2023-02-26 DIAGNOSIS — C189 Malignant neoplasm of colon, unspecified: Secondary | ICD-10-CM

## 2023-02-26 DIAGNOSIS — D630 Anemia in neoplastic disease: Secondary | ICD-10-CM

## 2023-02-26 DIAGNOSIS — Z1509 Genetic susceptibility to other malignant neoplasm: Secondary | ICD-10-CM | POA: Diagnosis not present

## 2023-02-26 DIAGNOSIS — E039 Hypothyroidism, unspecified: Secondary | ICD-10-CM | POA: Insufficient documentation

## 2023-02-26 DIAGNOSIS — Z85828 Personal history of other malignant neoplasm of skin: Secondary | ICD-10-CM | POA: Diagnosis not present

## 2023-02-26 HISTORY — PX: ESOPHAGOGASTRODUODENOSCOPY (EGD) WITH PROPOFOL: SHX5813

## 2023-02-26 HISTORY — PX: HOT HEMOSTASIS: SHX5433

## 2023-02-26 HISTORY — PX: FLEXIBLE SIGMOIDOSCOPY: SHX5431

## 2023-02-26 SURGERY — ESOPHAGOGASTRODUODENOSCOPY (EGD) WITH PROPOFOL
Anesthesia: General

## 2023-02-26 MED ORDER — LIDOCAINE HCL (CARDIAC) PF 100 MG/5ML IV SOSY
PREFILLED_SYRINGE | INTRAVENOUS | Status: DC | PRN
  Administered 2023-02-26: 50 mg via INTRAVENOUS

## 2023-02-26 MED ORDER — PROPOFOL 10 MG/ML IV BOLUS
INTRAVENOUS | Status: DC | PRN
  Administered 2023-02-26: 100 mg via INTRAVENOUS

## 2023-02-26 MED ORDER — GLUCAGON HCL RDNA (DIAGNOSTIC) 1 MG IJ SOLR
INTRAMUSCULAR | Status: DC | PRN
  Administered 2023-02-26: .5 mg via INTRAVENOUS

## 2023-02-26 MED ORDER — LACTATED RINGERS IV SOLN: INTRAVENOUS | Status: DC

## 2023-02-26 MED ORDER — PROPOFOL 500 MG/50ML IV EMUL
INTRAVENOUS | Status: DC | PRN
  Administered 2023-02-26: 150 ug/kg/min via INTRAVENOUS

## 2023-02-26 MED ORDER — GLUCAGON HCL RDNA (DIAGNOSTIC) 1 MG IJ SOLR
INTRAMUSCULAR | Status: AC
  Filled 2023-02-26: qty 1

## 2023-02-26 NOTE — Transfer of Care (Signed)
Immediate Anesthesia Transfer of Care Note  Patient: Cassidy Barber  Procedure(s) Performed: ESOPHAGOGASTRODUODENOSCOPY (EGD) WITH PROPOFOL FLEXIBLE SIGMOIDOSCOPY HOT HEMOSTASIS (ARGON PLASMA COAGULATION/BICAP)  Patient Location: Short Stay  Anesthesia Type:General  Level of Consciousness: drowsy  Airway & Oxygen Therapy: Patient Spontanous Breathing  Post-op Assessment: Report given to RN and Post -op Vital signs reviewed and stable  Post vital signs: Reviewed and stable  Last Vitals:  Vitals Value Taken Time  BP    Temp    Pulse    Resp    SpO2      Last Pain:  Vitals:   02/26/23 0901  TempSrc:   PainSc: 0-No pain      Patients Stated Pain Goal: 2 (02/26/23 0741)  Complications: No notable events documented.

## 2023-02-26 NOTE — Anesthesia Procedure Notes (Signed)
Date/Time: 02/26/2023 9:07 AM  Performed by: Julian Reil, CRNAPre-anesthesia Checklist: Patient identified, Emergency Drugs available, Suction available and Patient being monitored Patient Re-evaluated:Patient Re-evaluated prior to induction Oxygen Delivery Method: Nasal cannula Induction Type: IV induction Placement Confirmation: positive ETCO2

## 2023-02-26 NOTE — H&P (Signed)
Cassidy Barber is an 72 y.o. female.   Chief Complaint: Follow-up GAVE and Lynch syndrome HPI: Cassidy Barber is a 72 y.o. female with past medical history of PBC status post liver transplant, GAVE complicated by iron deficiency anemia, Lynch syndrome s/p proctocolectomy, hypertension, hypothyroidism, history of colon cancer, multiple skin cancers, who presents for follow up of Lynch syndrome, GAVE.  The patient denies having any nausea, vomiting, fever, chills, hematochezia, melena, hematemesis, abdominal distention, abdominal pain,  jaundice, pruritus or weight loss.  Has an average of 6-8 bowel movements per day.  Most recent hemoglobin was 14.1 on 01/29/2023.  Past Medical History:  Diagnosis Date   Abdominal wall hernia    Incarcerated status post surgical repair 2019 - [facility]   Anemia of chronic disease    Atypical nevus 01/21/2018   atypical neoplasm- Left scalp-ant (txpbx + MOHS), atypical neoplasm- Left scalp post- (txpbx + MOHS)   Basal cell carcinoma    Colon cancer    Colon surgery 2005 and 2012   History of pulmonary hypertension    Pre liver transplant   Hypertension    Hypothyroidism    Lynch syndrome    Osteopenia    Primary biliary cirrhosis    Status post liver transplantation - follows at [facility]   SCCA (squamous cell carcinoma) of skin 02/23/2020   Right Upper Chest(moderate) (MOH's)   SCCA (squamous cell carcinoma) of skin 04/07/2020   Left Top Leg (Keratoacanthoma) treatment after biopsy   SCCA (squamous cell carcinoma) of skin 04/07/2020   Left Foot Dorsal (in situ) treatment after biopsy   SCCA (squamous cell carcinoma) of skin 03/27/2021   Right Upper Back (Keratoacanthoma) (excision) (clear)   SCCA (squamous cell carcinoma) of skin 06/13/2021   Right Shoulder - anterior (moderately differentiated) (tx p bx)   SCCA (squamous cell carcinoma) of skin 06/13/2021   Right Thigh - anterior (well differentiated) (tx p bx)   SCCA (squamous cell carcinoma) of skin  06/13/2021   Right Lower Leg - anterior (well differentiated) (tx p bx)   Squamous cell carcinoma of skin 04/22/2018   KA-Right mid chest (txpbx), KA-left elbow crease (txpbx), insitu-Right mid chest inf. (exc)   Squamous cell carcinoma of skin 05/20/2018   well diff-Left upper shin (txpbx), well diff-Right lower forearm (txpbx), well diff-Right upper shin (txpbx)   Squamous cell carcinoma of skin 06/11/2018   Scc + margin-Right mid chest inferior    Squamous cell carcinoma of skin 06/27/2018   well diff-Left mid thigh(txpbx), well diff-Left inner thigh (txpbx),insitu-Right cheek (txpbx),well diff-right inner heel (txpbx)   Squamous cell carcinoma of skin 09/16/2018   well diff-left shoulder (txpbx), well diff-Right chin (txpbx), well diff-right chest lateral (CX35FU)   Squamous cell carcinoma of skin 10/01/2018   Right outer lower shin (Txpbx)   Squamous cell carcinoma of skin 04/03/2019   well diff-Right center chest (MOHS), in situ-Right ear   Squamous cell carcinoma of skin 08/05/2019   in situ-Left calf (txpbx), in situ-left bicep (txpbx), well diff-Left chest,inf(txpbx), in situ-Right chest inf-(txpbx)   Squamous cell carcinoma of skin 11/19/2019   KA-left top leg (txpbx), modify-Riight forehead-(mohs), in situ-right hand (txpbx), in situ-Right forearm (txpbx), well diff-Right chest (txpbx), well diff-chin (txpbx)   Squamous cell carcinoma of skin 01/06/2020   KA- Left top leg   Squamous cell carcinoma of skin 05/12/2013   bowens-middle of chest (CX35FU)   Squamous cell carcinoma of skin 05/18/2015   well diff-Left upper arm (CX35FU + Exc),KA-right chest(txpbx), in  situ-Left shin (txpbx), well diff-Right cheek (CX35FU), KA-Left post scalp (CX35FU)   Squamous cell carcinoma of skin 08/09/2015   KA-Left post scalp ((MOHS), in situ- mid chest (Txpbx +exc), in situ-Left upper arm inferior (txpbx)   Squamous cell carcinoma of skin 10/13/2015   Left upper arm-clear   Squamous cell  carcinoma of skin 03/09/2016   mod diff-mid chest (txpbx+ exc), mod diff-Right chest (txpbx+exc), well diff-right cheek-(txpbx),well diff-Left hand-(txpbx), in situ-Left upper arm (txpbx), well diff-Right cheek -(txpbx), well diff-Right crease arm (txpbx)    Squamous cell carcinoma of skin 05/24/2016   well diff-Right nasal crease-(MOHS)   Squamous cell carcinoma of skin 08/02/2016   KA-Left chest med (txpbx)   Squamous cell carcinoma of skin 08/30/2016   in situ-Left outer zygoma (txpbx)   Squamous cell carcinoma of skin 12/06/2016   well diff-Left chest sup, Left shoudler, insitu- right post scalp   Squamous cell carcinoma of skin 02/14/2017   well diff-Left forearm (EXC),in situ-RIght ant neck   Squamous cell carcinoma of skin 06/14/2017   in situ-Right forearm (txpbx), in situ-Right chest (txpbx), well diff-left chest (txpbx), well diff-anterior neck- (txpbx)   Squamous cell carcinoma of skin 08/07/2017   well diff-Left upper shoulder (txpbx), sup and invasive-Left temple (txpbx), well diff-Right upper shin (txpbx), in situ-Right clavicle (txpbx)   Squamous cell carcinoma of skin 10/17/2017   well diff-ant. neck (MOHS), in situ-Right chest, inf (txpbx)   Squamous cell carcinoma of skin 01/21/2018   well diff- Right chest,ulnar (txpbx), well diff- right upper chest (txpbx), in situ-Right ant. crown (txpbx)   Squamous cell carcinoma of skin 08/02/2020   well diff-left lower leg-inferior (Txpbx)   Squamous cell carcinoma of skin 08/02/2020   well diff-right lower leg-mid (txpbx)   Squamous cell carcinoma of skin 08/02/2020   well diff-left chest upper   Squamous cell carcinoma of skin 08/02/2020   well diff-mid parietal scalp (MOHS)   Squamous cell carcinoma of skin 08/02/2020   well diff-right foot inner(txpbx)   Squamous cell carcinoma of skin 08/02/2020   well diff- left lower leg medial (txpbx)   Squamous cell carcinoma of skin 08/02/2020   well diff-left lower leg anterior  (txpbx)   Squamous cell carcinoma of skin 08/02/2020   well diff-left lower leg medial (txpbx)   Squamous cell carcinoma of skin 08/02/2020   well diff-right forearm-posterior (txpbx)    Past Surgical History:  Procedure Laterality Date   ABDOMINAL HERNIA REPAIR     Patient's states that she has had 8- 9 hernia surgeries   ABDOMINAL HYSTERECTOMY     BIOPSY  02/15/2021   Procedure: BIOPSY;  Surgeon: Malissa Hippo, MD;  [facility]: [facility];  Service: Endoscopy;;   BIOPSY  12/20/2021   Procedure: BIOPSY;  Surgeon: Malissa Hippo, MD;  [facility]: [facility];  Service: Endoscopy;;   CATARACT EXTRACTION W/PHACO Right 01/15/2020   Procedure: CATARACT EXTRACTION PHACO AND INTRAOCULAR LENS PLACEMENT (IOC);  Surgeon: Fabio Pierce, MD;  Location: [facility];  Service: Ophthalmology;  Laterality: Right;  CDE: 7.89   CATARACT EXTRACTION W/PHACO Left 01/29/2020   Procedure: CATARACT EXTRACTION PHACO AND INTRAOCULAR LENS PLACEMENT (IOC) (CDE: 6.33);  Surgeon: Fabio Pierce, MD;  Location: [facility];  Service: Ophthalmology;  Laterality: Left;   CHOLECYSTECTOMY  2007   COLON SURGERY  2008   Done at [facility]   COLONOSCOPY     Done at [facility]   ESOPHAGOGASTRODUODENOSCOPY N/A 08/21/2018   Procedure: ESOPHAGOGASTRODUODENOSCOPY (EGD);  Surgeon: Malissa Hippo,  MD;  [facility];  Service: Endoscopy;  Laterality: N/A;   ESOPHAGOGASTRODUODENOSCOPY (EGD) WITH PROPOFOL N/A 12/16/2019   Procedure: ESOPHAGOGASTRODUODENOSCOPY (EGD) WITH PROPOFOL;  Surgeon: Malissa Hippo, MD;  [facility];  Service: Endoscopy;  Laterality: N/A;   ESOPHAGOGASTRODUODENOSCOPY (EGD) WITH PROPOFOL N/A 12/20/2021   Procedure: ESOPHAGOGASTRODUODENOSCOPY (EGD) WITH PROPOFOL;  Surgeon: Malissa Hippo, MD;  [facility];  Service: Endoscopy;  Laterality: N/A;  9:05   ESOPHAGOGASTRODUODENOSCOPY (EGD) WITH PROPOFOL N/A 02/14/2022   Procedure: ESOPHAGOGASTRODUODENOSCOPY (EGD) WITH PROPOFOL;  Surgeon: Malissa Hippo, MD;  [facility];  Service: Endoscopy;  Laterality: N/A;  730   ESOPHAGOGASTRODUODENOSCOPY (EGD) WITH PROPOFOL N/A 06/04/2022   Procedure: ESOPHAGOGASTRODUODENOSCOPY (EGD) WITH PROPOFOL;  Surgeon: Dolores Frame, MD;  [facility];  Service: Gastroenterology;  Laterality: N/A;  145   EYE SURGERY     lasix   FLEXIBLE SIGMOIDOSCOPY N/A 10/20/2015   Procedure: FLEXIBLE SIGMOIDOSCOPY;  Surgeon: Malissa Hippo, MD;  [facility];  Service: Endoscopy;  Laterality: N/A;  81 - Dr Karilyn Cota has meeting until 1:00   FLEXIBLE SIGMOIDOSCOPY N/A 07/11/2016   Procedure: FLEXIBLE SIGMOIDOSCOPY;  Surgeon: Malissa Hippo, MD;  [facility];  Service: Endoscopy;  Laterality: N/A;  1200   FLEXIBLE SIGMOIDOSCOPY N/A 08/09/2017   Procedure: FLEXIBLE SIGMOIDOSCOPY;  Surgeon: Malissa Hippo, MD;  [facility];  Service: Endoscopy;  Laterality: N/A;  1:00   FLEXIBLE SIGMOIDOSCOPY N/A 08/21/2018   Procedure: FLEXIBLE SIGMOIDOSCOPY;  Surgeon: Malissa Hippo, MD;  [facility];  Service: Endoscopy;  Laterality: N/A;   FLEXIBLE SIGMOIDOSCOPY N/A 12/16/2019   Procedure: FLEXIBLE SIGMOIDOSCOPY wirh Propofol;  Surgeon: Malissa Hippo, MD;  [facility];  Service: Endoscopy;  Laterality: N/A;  7:30   FLEXIBLE SIGMOIDOSCOPY N/A 02/15/2021   Procedure: FLEXIBLE SIGMOIDOSCOPY WITH PROPOFOL;  Surgeon: Malissa Hippo, MD;  [facility];  Service: Endoscopy;  Laterality: N/A;  am   FLEXIBLE SIGMOIDOSCOPY N/A 12/20/2021   Procedure: FLEXIBLE SIGMOIDOSCOPY;  Surgeon: Malissa Hippo, MD;  [facility];  Service: Endoscopy;  Laterality: N/A;   FRACTURE SURGERY     right wrist metal plate   GI RADIOFREQUENCY ABLATION N/A 02/14/2022   Procedure: GI RADIOFREQUENCY ABLATION;  Surgeon: Malissa Hippo, MD;  [facility];  Service: Endoscopy;  Laterality: N/A;   HOT HEMOSTASIS N/A 12/20/2021   Procedure:  HOT HEMOSTASIS (ARGON PLASMA COAGULATION/BICAP);  Surgeon: Malissa Hippo, MD;  [facility];  Service: Endoscopy;  Laterality: N/A;   HOT HEMOSTASIS  06/04/2022   Procedure: HOT HEMOSTASIS (ARGON PLASMA COAGULATION/BICAP);  Surgeon: Marguerita Merles, Reuel Boom, MD;  [facility]: [facility];  Service: Gastroenterology;;   LIVER TRANSPLANT  11/19/2013   POLYPECTOMY  08/09/2017   Procedure: POLYPECTOMY;  Surgeon: Malissa Hippo, MD;  [facility];  Service: Endoscopy;;  colon small bowel   POLYPECTOMY N/A 02/14/2022   Procedure: POLYPECTOMY;  Surgeon: Malissa Hippo, MD;  [facility];  Service: Endoscopy;  Laterality: N/A;   REVERSE SHOULDER ARTHROPLASTY Left 07/17/2018   Procedure: LEFT REVERSE SHOULDER ARTHROPLASTY;  Surgeon: Francena Hanly, MD;  Location: [facility];  Service: Orthopedics;  Laterality: Left;    REVERSE SHOULDER ARTHROPLASTY Right 07/20/2021   Procedure: REVERSE SHOULDER ARTHROPLASTY;  Surgeon: Francena Hanly, MD;  Location: [facility];  Service: Orthopedics;  Laterality: Right;   SHOULDER CLOSED REDUCTION Left 09/27/2019  Procedure: CLOSED REDUCTION SHOULDER;  Surgeon: Durene Romans, MD;  Location: [facility];  Service: Orthopedics;  Laterality: Left;   SPLENECTOMY  2006   TOTAL SHOULDER REVISION Left 11/12/2019   Procedure: Revision Left Reverse Shoulder Arthroplasty with poly exchange SDD;  Surgeon: Francena Hanly, MD;  Location: [facility];  Service: Orthopedics;  Laterality: Left;  -SDDC   TYMPANOSTOMY TUBE PLACEMENT     UPPER GASTROINTESTINAL ENDOSCOPY     Done at [facility]    Family History  Problem Relation Age of Onset   Prostate cancer Father    Colon cancer Father    Colon cancer Sister    Lung cancer Sister    Healthy Son    Alcohol abuse Brother    Allergic rhinitis Neg Hx    Asthma Neg Hx    Eczema Neg Hx    Urticaria Neg Hx    Social History:  reports that she has never smoked. She has never used smokeless tobacco. She reports  that she does not drink alcohol and does not use drugs.  Allergies:  Allergies  Allergen Reactions   Ciprofloxacin Itching   Codeine Nausea Only    Medications Prior to Admission  Medication Sig Dispense Refill   alendronate (FOSAMAX) 70 MG tablet TAKE 1 TABLET EVERY WEEK 12 tablet 2   ALPRAZolam (XANAX) 0.5 MG tablet Take 1 tablet (0.5 mg total) by mouth at bedtime. 90 tablet 1   Biotin 1000 MCG tablet Take 1,000 mcg by mouth 3 (three) times daily.     Biotin w/ Vitamins C & E (HAIR SKIN & NAILS GUMMIES PO) Take 2 tablets by mouth daily.     Calcium Carb-Cholecalciferol (CALCIUM 600 + D PO) Take 1 tablet by mouth 2 (two) times daily.     Cholecalciferol (VITAMIN D) 50 MCG (2000 UT) tablet Take 2,000 Units by mouth daily.     famotidine (PEPCID) 20 MG tablet Take 1 tablet (20 mg total) by mouth 2 (two) times daily. 60 tablet 5   gabapentin (NEURONTIN) 100 MG capsule Take 1 capsule (100 mg total) by mouth daily. 90 capsule 0   levothyroxine (SYNTHROID) 112 MCG tablet Take 1 tablet (112 mcg total) by mouth daily. 90 tablet 4   losartan (COZAAR) 25 MG tablet Take 1 tablet (25 mg total) by mouth daily. 90 tablet 4   metoprolol succinate (TOPROL XL) 25 MG 24 hr tablet Take 1 tablet (25 mg total) by mouth daily. 90 tablet 4   Multiple Vitamins-Minerals (MULTIVITAMIN WITH MINERALS) tablet Take 1 tablet by mouth daily.     mupirocin ointment (BACTROBAN) 2 % Apply 1 application topically daily as needed (Cancer spots). 22 g 3   pantoprazole (PROTONIX) 40 MG tablet TAKE ONE (1) TABLET BY MOUTH TWO (2) TIMES DAILY (Patient taking differently: Take 40 mg by mouth daily.) 180 tablet 0   Probiotic Product (PROBIOTIC PO) Take 1 capsule by mouth daily.     sirolimus (RAPAMUNE) 1 MG tablet Take 1 mg by mouth 2 (two) times daily.     ursodiol (ACTIGALL) 300 MG capsule Take 3 capsules (900 mg total) by mouth daily. 90 capsule 5   vitamin C (ASCORBIC ACID) 250 MG tablet Take 250 mg by mouth daily.      Vitamin D, Ergocalciferol, (DRISDOL) 1.25 MG (50000 UNIT) CAPS capsule TAKE 1 CAPSULE BY MOUTH ONCE A WEEK (Patient taking differently: Take 50,000 Units by mouth every Tuesday.) 12 capsule 3   vitamin E 180 MG (400 UNITS) capsule Take  400 Units by mouth daily.     ZORTRESS 0.5 MG TABS Take 4 tablets (2 mg total) by mouth 2 (two) times daily. (Patient taking differently: Take 1-1.3 mg by mouth See admin instructions. Take 1.5 in the morning and 1 mg at night) 180 tablet 2   acetaminophen (TYLENOL) 500 MG tablet Take 1,000 mg by mouth every 6 (six) hours as needed (for pain.).      cetirizine (ZYRTEC) 10 MG tablet Take 1 tablet (10 mg total) by mouth daily as needed for allergies. 90 tablet 3   Clobetasol Prop Emollient Base (CLOBETASOL PROPIONATE E) 0.05 % emollient cream Apply 1 application. topically 2 (two) times daily. (Patient taking differently: Apply 1 application  topically 2 (two) times daily as needed (rash).) 180 g 1   clotrimazole-betamethasone (LOTRISONE) cream Apply 1 application topically 2 (two) times daily. (Patient taking differently: Apply 1 application  topically daily as needed (Rash).) 30 g 0   doxycycline (VIBRA-TABS) 100 MG tablet Take 1 tablet (100 mg total) by mouth 2 (two) times daily. (Patient not taking: Reported on 02/19/2023) 20 tablet 0   fluorouracil (EFUDEX) 5 % cream Apply 1 application topically daily as needed (cancer spots).   0   fluticasone (FLONASE) 50 MCG/ACT nasal spray Place 2 sprays into both nostrils daily. (Patient taking differently: Place 2 sprays into both nostrils daily as needed for allergies.) 16 g 6   hydrocortisone cream 1 % Apply 1 Application topically 2 (two) times daily as needed for itching.     silver sulfADIAZINE (SILVADENE) 1 % cream Apply 1 Application topically daily. Apply to large surface area once to twice daily (Patient taking differently: Apply 1 Application topically daily as needed (skin irritation). Apply to large surface area once to  twice daily) 400 g 1    No results found. However, due to the size of the patient record, not all encounters were searched. Please check Results Review for a complete set of results. No results found.  Review of Systems  All other systems reviewed and are negative.   Blood pressure 98/74, pulse 67, temperature 97.7 F (36.5 C), temperature source Oral, resp. rate 18, height 5\' 3"  (1.6 m), weight 63 kg, SpO2 100 %. Physical Exam  GENERAL: The patient is AO x3, in no acute distress. HEENT: Head is normocephalic and atraumatic. EOMI are intact. Mouth is well hydrated and without lesions. NECK: Supple. No masses LUNGS: Clear to auscultation. No presence of rhonchi/wheezing/rales. Adequate chest expansion HEART: RRR, normal s1 and s2. ABDOMEN: Soft, nontender, no guarding, no peritoneal signs, and nondistended. BS +. No masses. EXTREMITIES: Without any cyanosis, clubbing, rash, lesions or edema. NEUROLOGIC: AOx3, no focal motor deficit. SKIN: no jaundice, no rashes  Assessment/Plan DAYANI WINBUSH is a 72 y.o. female with past medical history of PBC status post liver transplant, GAVE complicated by iron deficiency anemia, Lynch syndrome s/p proctocolectomy, hypertension, hypothyroidism, history of colon cancer, multiple skin cancers, who presents for follow up of Lynch syndrome, GAVE.  Will proceed with EGD and flexible sigmoidoscopy.  Dolores Frame, MD 02/26/2023, 8:08 AM

## 2023-02-26 NOTE — Anesthesia Postprocedure Evaluation (Signed)
Anesthesia Post Note  Patient: NARINE KURMAN  Procedure(s) Performed: ESOPHAGOGASTRODUODENOSCOPY (EGD) WITH PROPOFOL FLEXIBLE SIGMOIDOSCOPY HOT HEMOSTASIS (ARGON PLASMA COAGULATION/BICAP)  Patient location during evaluation: Phase II Anesthesia Type: General Level of consciousness: awake and alert and oriented Pain management: pain level controlled Vital Signs Assessment: post-procedure vital signs reviewed and stable Respiratory status: spontaneous breathing, nonlabored ventilation and respiratory function stable Cardiovascular status: blood pressure returned to baseline and stable Postop Assessment: no apparent nausea or vomiting Anesthetic complications: no  No notable events documented.   Last Vitals:  Vitals:   02/26/23 0741 02/26/23 0937  BP: 98/74 115/73  Pulse: 67 72  Resp: 18 (!) 21  Temp: 36.5 C 36.4 C  SpO2: 100% 99%    Last Pain:  Vitals:   02/26/23 0943  TempSrc:   PainSc: 0-No pain                 Andric Kerce C Ashla Murph

## 2023-02-26 NOTE — Op Note (Signed)
[facility] Patient Name: Cassidy Barber Procedure Date: 02/26/2023 8:54 AM MRN: [id number] Date of Birth: [birth year] Attending MD: [name] , , [id number] CSN: [id number] Age: 72 Admit Type: Outpatient Procedure:                Upper GI endoscopy Indications:              Hereditary nonpolyposis colorectal cancer (Lynch                            Syndrome), Watermelon stomach (GAVE syndrome) Providers:                [name], Jannett Celestine, RN, Angelica Ran,                            Pandora Leiter, Technician Referring MD:             [name] Medicines:                Monitored Anesthesia Care Complications:            No immediate complications. Estimated Blood Loss:     Estimated blood loss: none. Procedure:                Pre-Anesthesia Assessment:                           - Prior to the procedure, a History and Physical                            was performed, and patient medications, allergies                            and sensitivities were reviewed. The patient's                            tolerance of previous anesthesia was reviewed.                           - The risks and benefits of the procedure and the                            sedation options and risks were discussed with the                            patient. All questions were answered and informed                            consent was obtained.                           - ASA Grade Assessment: III - A patient with severe                            systemic disease.                           After obtaining informed consent, the endoscope  was                            passed under direct vision. Throughout the                            procedure, the patient's blood pressure, pulse, and                            oxygen saturations were monitored continuously. The                            GIF-H190 ([id number]) scope was introduced through the                            mouth, and  advanced to the second part of duodenum.                            The upper GI endoscopy was accomplished without                            difficulty. The patient tolerated the procedure                            well. Scope In: 9:07:30 AM Scope Out: 9:22:56 AM Total Procedure Duration: 0 hours 15 minutes 26 seconds  Findings:      A 2 cm hiatal hernia was present.      Moderate gastric antral vascular ectasia without bleeding was present in       the gastric antrum. Coagulation for bleeding prevention using argon       plasma at 0.3 liters/minute and 20 watts was successful. No other       abnormalities were seen in the mucosal lining.      The examined duodenum was normal. Impression:               - 2 cm hiatal hernia.                           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                            Treated with argon plasma coagulation (APC).                           - Normal examined duodenum.                           - No specimens collected. Moderate Sedation:      Per Anesthesia Care Recommendation:           - Discharge patient to home (ambulatory).                           - Resume previous diet.                           - Await pathology  results.                           - Repeat upper endoscopy in 1 year for surveillance. Procedure Code(s):        --- Professional ---                           [phone], Esophagogastroduodenoscopy, flexible,                            transoral; with control of bleeding, any method Diagnosis Code(s):        --- Professional ---                           K44.9, Diaphragmatic hernia without obstruction or                            gangrene                           K31.819, Angiodysplasia of stomach and duodenum                            without bleeding                           C18.9, Malignant neoplasm of colon, unspecified                           Z15.09, Genetic susceptibility to other malignant                             neoplasm CPT copyright 2022 [employer]. All rights reserved. The codes documented in this report are preliminary and upon coder review may  be revised to meet current compliance requirements. [name], MD [name],  02/26/2023 9:26:36 AM This report has been signed electronically. Number of Addenda: 0

## 2023-02-26 NOTE — Anesthesia Preprocedure Evaluation (Signed)
Anesthesia Evaluation  Patient identified by MRN, date of birth, ID band Patient awake    Reviewed: Allergy & Precautions, H&P , NPO status , Patient's Chart, lab work & pertinent test results, reviewed documented beta blocker date and time   Airway Mallampati: II  TM Distance: >3 FB Neck ROM: Full    Dental  (+) Missing, Dental Advisory Given   Pulmonary neg pulmonary ROS   Pulmonary exam normal breath sounds clear to auscultation       Cardiovascular Exercise Tolerance: Good hypertension, Pt. on medications and Pt. on home beta blockers + Peripheral Vascular Disease  Normal cardiovascular exam Rhythm:Regular Rate:Normal     Neuro/Psych Seizures -, Well Controlled,  PSYCHIATRIC DISORDERS Anxiety        GI/Hepatic ,GERD  Medicated,,(+) Cirrhosis  (s/p liver transplant)      , Hepatitis -  Endo/Other  Hypothyroidism    Renal/GU Renal disease  negative genitourinary   Musculoskeletal negative musculoskeletal ROS (+)    Abdominal   Peds negative pediatric ROS (+)  Hematology  (+) Blood dyscrasia, anemia   Anesthesia Other Findings   Reproductive/Obstetrics negative OB ROS                             Anesthesia Physical Anesthesia Plan  ASA: 3  Anesthesia Plan: General   Post-op Pain Management: Minimal or no pain anticipated   Induction: Intravenous  PONV Risk Score and Plan: Propofol infusion  Airway Management Planned: Nasal Cannula and Natural Airway  Additional Equipment:   Intra-op Plan:   Post-operative Plan:   Informed Consent: I have reviewed the patients History and Physical, chart, labs and discussed the procedure including the risks, benefits and alternatives for the proposed anesthesia with the patient or authorized representative who has indicated his/her understanding and acceptance.     Dental advisory given  Plan Discussed with: CRNA and  Surgeon  Anesthesia Plan Comments:        Anesthesia Quick Evaluation

## 2023-02-26 NOTE — Discharge Instructions (Signed)
You are being discharged to home.  Resume your previous diet.  We are waiting for your pathology results.  Your physician has recommended a repeat upper endoscopy in one year for surveillance.  Your physician has recommended a repeat flexible sigmoidoscopy in one year for surveillance.

## 2023-02-26 NOTE — Op Note (Signed)
[facility] Patient Name: Cassidy Barber Procedure Date: 02/26/2023 8:53 AM MRN: [id number] Date of Birth: August 13, 1951 Attending MD: [name] , , [id number] CSN: [id number] Age: 72 Admit Type: Outpatient Procedure:                Flexible Sigmoidoscopy Indications:               Providers:                [name], Jannett Celestine, RN, Angelica Ran,                            Pandora Leiter, Technician Referring MD:             [name] Medicines:                Propofol per Anesthesia Complications:            No immediate complications. Estimated Blood Loss:     Estimated blood loss: none. Procedure:                Pre-Anesthesia Assessment:                           - Prior to the procedure, a History and Physical                            was performed, and patient medications, allergies                            and sensitivities were reviewed. The patient's                            tolerance of previous anesthesia was reviewed.                           - The risks and benefits of the procedure and the                            sedation options and risks were discussed with the                            patient. All questions were answered and informed                            consent was obtained.                           - ASA Grade Assessment: III - A patient with severe                            systemic disease.                           After obtaining informed consent, the scope was                            passed under direct vision. The GIF-H190 ([id number])  scope was introduced through the anus and advanced                            to the the ileo-sigmoid anastomosis. The flexible                            sigmoidoscopy was accomplished without difficulty.                            The patient tolerated the procedure well. The                            quality of the bowel preparation was adequate. Scope In:  9:27:44 AM Scope Out: 9:34:59 AM Total Procedure Duration: 0 hours 7 minutes 15 seconds  Findings:      The perianal and digital rectal examinations were normal.      There was evidence of a prior end-to-side ileo-colonic anastomosis at 18       cm proximal to the anus. This was patent and was characterized by       healthy appearing mucosa. The anastomosis was traversed.      Non-bleeding internal hemorrhoids were found during retroflexion. The       hemorrhoids were small. Impression:               - Patent end-to-side ileo-colonic anastomosis,                            characterized by healthy appearing mucosa.                           - Non-bleeding internal hemorrhoids.                           - No specimens collected. Moderate Sedation:      Per Anesthesia Care Recommendation:           - Discharge patient to home (ambulatory).                           - Resume previous diet.                           - Repeat flexible sigmoidoscopy in 1 year for                            surveillance. Procedure Code(s):        --- Professional ---                           [phone], Sigmoidoscopy, flexible; diagnostic,                            including collection of specimen(s) by brushing or                            washing, when performed (separate procedure) Diagnosis Code(s):        --- Professional ---  Z98.0, Intestinal bypass and anastomosis status                           K64.8, Other hemorrhoids CPT copyright 2022 [employer]. All rights reserved. The codes documented in this report are preliminary and upon coder review may  be revised to meet current compliance requirements. [name], MD [name],  02/26/2023 9:39:55 AM This report has been signed electronically. Number of Addenda: 0

## 2023-02-27 DIAGNOSIS — D849 Immunodeficiency, unspecified: Secondary | ICD-10-CM | POA: Diagnosis not present

## 2023-02-27 DIAGNOSIS — Z944 Liver transplant status: Secondary | ICD-10-CM | POA: Diagnosis not present

## 2023-03-04 ENCOUNTER — Encounter (HOSPITAL_COMMUNITY): Payer: Self-pay | Admitting: Gastroenterology

## 2023-03-05 DIAGNOSIS — Z1231 Encounter for screening mammogram for malignant neoplasm of breast: Secondary | ICD-10-CM | POA: Diagnosis not present

## 2023-03-06 DIAGNOSIS — D485 Neoplasm of uncertain behavior of skin: Secondary | ICD-10-CM | POA: Diagnosis not present

## 2023-03-06 DIAGNOSIS — C4402 Squamous cell carcinoma of skin of lip: Secondary | ICD-10-CM | POA: Diagnosis not present

## 2023-03-06 DIAGNOSIS — Z1509 Genetic susceptibility to other malignant neoplasm: Secondary | ICD-10-CM | POA: Diagnosis not present

## 2023-03-06 DIAGNOSIS — C44329 Squamous cell carcinoma of skin of other parts of face: Secondary | ICD-10-CM | POA: Diagnosis not present

## 2023-03-11 ENCOUNTER — Other Ambulatory Visit: Payer: Medicare Other

## 2023-03-12 ENCOUNTER — Encounter: Payer: Self-pay | Admitting: Family

## 2023-03-14 DIAGNOSIS — Z944 Liver transplant status: Secondary | ICD-10-CM | POA: Diagnosis not present

## 2023-03-14 DIAGNOSIS — D849 Immunodeficiency, unspecified: Secondary | ICD-10-CM | POA: Diagnosis not present

## 2023-03-20 ENCOUNTER — Telehealth: Payer: Self-pay | Admitting: Family

## 2023-03-20 NOTE — Telephone Encounter (Signed)
Cassidy Barber scheduled their annual wellness visit. Appointment made for 07/04/2023.  Thank you,  Judeth Cornfield,  AMB Clinical Support [facility] AWV Program Direct Dial ??[phone]

## 2023-03-27 NOTE — Addendum Note (Signed)
Addended by: Geradine Girt B on: 03/27/2023 11:04 AM   Modules accepted: Orders

## 2023-03-29 ENCOUNTER — Encounter: Payer: Self-pay | Admitting: Plastic Surgery

## 2023-03-29 ENCOUNTER — Ambulatory Visit: Payer: Medicare Other | Admitting: Plastic Surgery

## 2023-03-29 VITALS — BP 122/76 | HR 90 | Ht 64.0 in | Wt 139.9 lb

## 2023-03-29 DIAGNOSIS — N179 Acute kidney failure, unspecified: Secondary | ICD-10-CM

## 2023-03-29 DIAGNOSIS — S0100XA Unspecified open wound of scalp, initial encounter: Secondary | ICD-10-CM | POA: Insufficient documentation

## 2023-03-29 DIAGNOSIS — I81 Portal vein thrombosis: Secondary | ICD-10-CM

## 2023-03-29 DIAGNOSIS — Z923 Personal history of irradiation: Secondary | ICD-10-CM

## 2023-03-29 DIAGNOSIS — M952 Other acquired deformity of head: Secondary | ICD-10-CM | POA: Diagnosis not present

## 2023-03-29 DIAGNOSIS — R569 Unspecified convulsions: Secondary | ICD-10-CM

## 2023-03-29 DIAGNOSIS — Z944 Liver transplant status: Secondary | ICD-10-CM

## 2023-03-29 DIAGNOSIS — S0103XA Puncture wound without foreign body of scalp, initial encounter: Secondary | ICD-10-CM

## 2023-03-29 DIAGNOSIS — Z85828 Personal history of other malignant neoplasm of skin: Secondary | ICD-10-CM | POA: Diagnosis not present

## 2023-03-29 DIAGNOSIS — Z1509 Genetic susceptibility to other malignant neoplasm: Secondary | ICD-10-CM

## 2023-03-29 DIAGNOSIS — D849 Immunodeficiency, unspecified: Secondary | ICD-10-CM | POA: Diagnosis not present

## 2023-03-29 NOTE — Progress Notes (Signed)
Patient ID: Cassidy Barber, female    DOB: February 07, 1951, 72 y.o.   MRN: [id number]   Chief Complaint  Patient presents with   consult   Skin Problem    The patient is a 72 year old female here for evaluation of her skull.  The patient underwent a liver transplant around 2015.  She is being managed at [facility].  She is on immunosuppression therapy.  She also had a skin cancer of her scalp that was excised and radiated and this has led to a 1 x 2 cm wound.  It appears that the skull is exposed and does not have periosteum over it.  She has about a 3 x 3 cm area of no hair where it looks like the radiation effect took place.  Past medical history is positive for squamous cell carcinoma of the skin, basal cell carcinoma of the skin, colon surgery, abdominal hernia,      Review of Systems  Constitutional: Negative.   HENT: Negative.    Eyes: Negative.   Respiratory: Negative.  Negative for chest tightness.   Cardiovascular: Negative.   Gastrointestinal: Negative.   Endocrine: Negative.   Genitourinary: Negative.     Past Medical History:  Diagnosis Date   Abdominal wall hernia    Incarcerated status post surgical repair 2019 - [facility]   Anemia of chronic disease    Atypical nevus 01/21/2018   atypical neoplasm- Left scalp-ant (txpbx + MOHS), atypical neoplasm- Left scalp post- (txpbx + MOHS)   Basal cell carcinoma    Colon cancer (HCC)    Colon surgery 2005 and 2012   History of pulmonary hypertension    Pre liver transplant   Hypertension    Hypothyroidism    Lynch syndrome    Osteopenia    Primary biliary cirrhosis (HCC)    Status post liver transplantation - follows at [facility]   SCCA (squamous cell carcinoma) of skin 02/23/2020   Right Upper Chest(moderate) (MOH's)   SCCA (squamous cell carcinoma) of skin 04/07/2020   Left Top Leg (Keratoacanthoma) treatment after biopsy   SCCA (squamous cell carcinoma) of skin 04/07/2020   Left Foot Dorsal (in situ) treatment after biopsy    SCCA (squamous cell carcinoma) of skin 03/27/2021   Right Upper Back (Keratoacanthoma) (excision) (clear)   SCCA (squamous cell carcinoma) of skin 06/13/2021   Right Shoulder - anterior (moderately differentiated) (tx p bx)   SCCA (squamous cell carcinoma) of skin 06/13/2021   Right Thigh - anterior (well differentiated) (tx p bx)   SCCA (squamous cell carcinoma) of skin 06/13/2021   Right Lower Leg - anterior (well differentiated) (tx p bx)   Squamous cell carcinoma of skin 04/22/2018   KA-Right mid chest (txpbx), KA-left elbow crease (txpbx), insitu-Right mid chest inf. (exc)   Squamous cell carcinoma of skin 05/20/2018   well diff-Left upper shin (txpbx), well diff-Right lower forearm (txpbx), well diff-Right upper shin (txpbx)   Squamous cell carcinoma of skin 06/11/2018   Scc + margin-Right mid chest inferior    Squamous cell carcinoma of skin 06/27/2018   well diff-Left mid thigh(txpbx), well diff-Left inner thigh (txpbx),insitu-Right cheek (txpbx),well diff-right inner heel (txpbx)   Squamous cell carcinoma of skin 09/16/2018   well diff-left shoulder (txpbx), well diff-Right chin (txpbx), well diff-right chest lateral (CX35FU)   Squamous cell carcinoma of skin 10/01/2018   Right outer lower shin (Txpbx)   Squamous cell carcinoma of skin 04/03/2019   well diff-Right center chest (MOHS), in situ-Right ear  Squamous cell carcinoma of skin 08/05/2019   in situ-Left calf (txpbx), in situ-left bicep (txpbx), well diff-Left chest,inf(txpbx), in situ-Right chest inf-(txpbx)   Squamous cell carcinoma of skin 11/19/2019   KA-left top leg (txpbx), modify-Riight forehead-(mohs), in situ-right hand (txpbx), in situ-Right forearm (txpbx), well diff-Right chest (txpbx), well diff-chin (txpbx)   Squamous cell carcinoma of skin 01/06/2020   KA- Left top leg   Squamous cell carcinoma of skin 05/12/2013   bowens-middle of chest (CX35FU)   Squamous cell carcinoma of skin 05/18/2015   well  diff-Left upper arm (CX35FU + Exc),KA-right chest(txpbx), in situ-Left shin (txpbx), well diff-Right cheek (CX35FU), KA-Left post scalp (CX35FU)   Squamous cell carcinoma of skin 08/09/2015   KA-Left post scalp ((MOHS), in situ- mid chest (Txpbx +exc), in situ-Left upper arm inferior (txpbx)   Squamous cell carcinoma of skin 10/13/2015   Left upper arm-clear   Squamous cell carcinoma of skin 03/09/2016   mod diff-mid chest (txpbx+ exc), mod diff-Right chest (txpbx+exc), well diff-right cheek-(txpbx),well diff-Left hand-(txpbx), in situ-Left upper arm (txpbx), well diff-Right cheek -(txpbx), well diff-Right crease arm (txpbx)    Squamous cell carcinoma of skin 05/24/2016   well diff-Right nasal crease-(MOHS)   Squamous cell carcinoma of skin 08/02/2016   KA-Left chest med (txpbx)   Squamous cell carcinoma of skin 08/30/2016   in situ-Left outer zygoma (txpbx)   Squamous cell carcinoma of skin 12/06/2016   well diff-Left chest sup, Left shoudler, insitu- right post scalp   Squamous cell carcinoma of skin 02/14/2017   well diff-Left forearm (EXC),in situ-RIght ant neck   Squamous cell carcinoma of skin 06/14/2017   in situ-Right forearm (txpbx), in situ-Right chest (txpbx), well diff-left chest (txpbx), well diff-anterior neck- (txpbx)   Squamous cell carcinoma of skin 08/07/2017   well diff-Left upper shoulder (txpbx), sup and invasive-Left temple (txpbx), well diff-Right upper shin (txpbx), in situ-Right clavicle (txpbx)   Squamous cell carcinoma of skin 10/17/2017   well diff-ant. neck (MOHS), in situ-Right chest, inf (txpbx)   Squamous cell carcinoma of skin 01/21/2018   well diff- Right chest,ulnar (txpbx), well diff- right upper chest (txpbx), in situ-Right ant. crown (txpbx)   Squamous cell carcinoma of skin 08/02/2020   well diff-left lower leg-inferior (Txpbx)   Squamous cell carcinoma of skin 08/02/2020   well diff-right lower leg-mid (txpbx)   Squamous cell carcinoma of skin  08/02/2020   well diff-left chest upper   Squamous cell carcinoma of skin 08/02/2020   well diff-mid parietal scalp (MOHS)   Squamous cell carcinoma of skin 08/02/2020   well diff-right foot inner(txpbx)   Squamous cell carcinoma of skin 08/02/2020   well diff- left lower leg medial (txpbx)   Squamous cell carcinoma of skin 08/02/2020   well diff-left lower leg anterior (txpbx)   Squamous cell carcinoma of skin 08/02/2020   well diff-left lower leg medial (txpbx)   Squamous cell carcinoma of skin 08/02/2020   well diff-right forearm-posterior (txpbx)    Past Surgical History:  Procedure Laterality Date   ABDOMINAL HERNIA REPAIR     Patient's states that she has had 8- 9 hernia surgeries   ABDOMINAL HYSTERECTOMY     BIOPSY  02/15/2021   Procedure: BIOPSY;  Surgeon: Malissa Hippo, MD;  [facility];  Service: Endoscopy;;   BIOPSY  12/20/2021   Procedure: BIOPSY;  Surgeon: Malissa Hippo, MD;  [facility];  Service: Endoscopy;;   CATARACT EXTRACTION W/PHACO Right 01/15/2020   Procedure: CATARACT  EXTRACTION PHACO AND INTRAOCULAR LENS PLACEMENT (IOC);  Surgeon: Fabio Pierce, MD;  Location: [facility];  Service: Ophthalmology;  Laterality: Right;  CDE: 7.89   CATARACT EXTRACTION W/PHACO Left 01/29/2020   Procedure: CATARACT EXTRACTION PHACO AND INTRAOCULAR LENS PLACEMENT (IOC) (CDE: 6.33);  Surgeon: Fabio Pierce, MD;  Location: [facility];  Service: Ophthalmology;  Laterality: Left;   CHOLECYSTECTOMY  2007   COLON SURGERY  2008   Done at [facility]   COLONOSCOPY     Done at [facility]   ESOPHAGOGASTRODUODENOSCOPY N/A 08/21/2018   Procedure: ESOPHAGOGASTRODUODENOSCOPY (EGD);  Surgeon: Malissa Hippo, MD;  [facility];  Service: Endoscopy;  Laterality: N/A;   ESOPHAGOGASTRODUODENOSCOPY (EGD) WITH PROPOFOL N/A 12/16/2019   Procedure: ESOPHAGOGASTRODUODENOSCOPY (EGD) WITH PROPOFOL;  Surgeon: Malissa Hippo, MD;  [facility];  Service: Endoscopy;   Laterality: N/A;   ESOPHAGOGASTRODUODENOSCOPY (EGD) WITH PROPOFOL N/A 12/20/2021   Procedure: ESOPHAGOGASTRODUODENOSCOPY (EGD) WITH PROPOFOL;  Surgeon: Malissa Hippo, MD;  [facility];  Service: Endoscopy;  Laterality: N/A;  9:05   ESOPHAGOGASTRODUODENOSCOPY (EGD) WITH PROPOFOL N/A 02/14/2022   Procedure: ESOPHAGOGASTRODUODENOSCOPY (EGD) WITH PROPOFOL;  Surgeon: Malissa Hippo, MD;  [facility];  Service: Endoscopy;  Laterality: N/A;  730   ESOPHAGOGASTRODUODENOSCOPY (EGD) WITH PROPOFOL N/A 06/04/2022   Procedure: ESOPHAGOGASTRODUODENOSCOPY (EGD) WITH PROPOFOL;  Surgeon: Dolores Frame, MD;  [facility];  Service: Gastroenterology;  Laterality: N/A;  145   ESOPHAGOGASTRODUODENOSCOPY (EGD) WITH PROPOFOL N/A 02/26/2023   Procedure: ESOPHAGOGASTRODUODENOSCOPY (EGD) WITH PROPOFOL;  Surgeon: Dolores Frame, MD;  [facility];  Service: Gastroenterology;  Laterality: N/A;  9:15AM;ASA 3   EYE SURGERY     lasix   FLEXIBLE SIGMOIDOSCOPY N/A 10/20/2015   Procedure: FLEXIBLE SIGMOIDOSCOPY;  Surgeon: Malissa Hippo, MD;  [facility];  Service: Endoscopy;  Laterality: N/A;  2 - Dr Karilyn Cota has meeting until 1:00   FLEXIBLE SIGMOIDOSCOPY N/A 07/11/2016   Procedure: FLEXIBLE SIGMOIDOSCOPY;  Surgeon: Malissa Hippo, MD;  [facility];  Service: Endoscopy;  Laterality: N/A;  1200   FLEXIBLE SIGMOIDOSCOPY N/A 08/09/2017   Procedure: FLEXIBLE SIGMOIDOSCOPY;  Surgeon: Malissa Hippo, MD;  [facility];  Service: Endoscopy;  Laterality: N/A;  1:00   FLEXIBLE SIGMOIDOSCOPY N/A 08/21/2018   Procedure: FLEXIBLE SIGMOIDOSCOPY;  Surgeon: Malissa Hippo, MD;  [facility];  Service: Endoscopy;  Laterality: N/A;   FLEXIBLE SIGMOIDOSCOPY N/A 12/16/2019   Procedure: FLEXIBLE SIGMOIDOSCOPY wirh Propofol;  Surgeon: Malissa Hippo, MD;  [facility];  Service: Endoscopy;  Laterality: N/A;  7:30    FLEXIBLE SIGMOIDOSCOPY N/A 02/15/2021   Procedure: FLEXIBLE SIGMOIDOSCOPY WITH PROPOFOL;  Surgeon: Malissa Hippo, MD;  [facility];  Service: Endoscopy;  Laterality: N/A;  am   FLEXIBLE SIGMOIDOSCOPY N/A 12/20/2021   Procedure: FLEXIBLE SIGMOIDOSCOPY;  Surgeon: Malissa Hippo, MD;  [facility];  Service: Endoscopy;  Laterality: N/A;   FLEXIBLE SIGMOIDOSCOPY N/A 02/26/2023   Procedure: FLEXIBLE SIGMOIDOSCOPY;  Surgeon: Dolores Frame, MD;  [facility];  Service: Gastroenterology;  Laterality: N/A;  9:15AM; ASA 3   FRACTURE SURGERY     right wrist metal plate   GI RADIOFREQUENCY ABLATION N/A 02/14/2022   Procedure: GI RADIOFREQUENCY ABLATION;  Surgeon: Malissa Hippo, MD;  [facility];  Service: Endoscopy;  Laterality: N/A;   HOT HEMOSTASIS N/A 12/20/2021   Procedure: HOT HEMOSTASIS (ARGON PLASMA COAGULATION/BICAP);  Surgeon:  Malissa Hippo, MD;  [facility];  Service: Endoscopy;  Laterality: N/A;   HOT HEMOSTASIS  06/04/2022   Procedure: HOT HEMOSTASIS (ARGON PLASMA COAGULATION/BICAP);  Surgeon: Marguerita Merles, Reuel Boom, MD;  [facility];  Service: Gastroenterology;;   HOT HEMOSTASIS  02/26/2023   Procedure: HOT HEMOSTASIS (ARGON PLASMA COAGULATION/BICAP);  Surgeon: Marguerita Merles, Reuel Boom, MD;  [facility];  Service: Gastroenterology;;   LIVER TRANSPLANT  11/19/2013   POLYPECTOMY  08/09/2017   Procedure: POLYPECTOMY;  Surgeon: Malissa Hippo, MD;  [facility];  Service: Endoscopy;;  colon small bowel   POLYPECTOMY N/A 02/14/2022   Procedure: POLYPECTOMY;  Surgeon: Malissa Hippo, MD;  [facility];  Service: Endoscopy;  Laterality: N/A;   REVERSE SHOULDER ARTHROPLASTY Left 07/17/2018   Procedure: LEFT REVERSE SHOULDER ARTHROPLASTY;  Surgeon: Francena Hanly, MD;  Location: [facility];  Service: Orthopedics;  Laterality: Left;    REVERSE SHOULDER ARTHROPLASTY Right  07/20/2021   Procedure: REVERSE SHOULDER ARTHROPLASTY;  Surgeon: Francena Hanly, MD;  Location: [facility];  Service: Orthopedics;  Laterality: Right;   SHOULDER CLOSED REDUCTION Left 09/27/2019   Procedure: CLOSED REDUCTION SHOULDER;  Surgeon: Durene Romans, MD;  Location: [facility];  Service: Orthopedics;  Laterality: Left;   SPLENECTOMY  2006   TOTAL SHOULDER REVISION Left 11/12/2019   Procedure: Revision Left Reverse Shoulder Arthroplasty with poly exchange SDD;  Surgeon: Francena Hanly, MD;  Location: [facility];  Service: Orthopedics;  Laterality: Left;  -SDDC   TYMPANOSTOMY TUBE PLACEMENT     UPPER GASTROINTESTINAL ENDOSCOPY     Done at [facility]      Current Outpatient Medications:    acetaminophen (TYLENOL) 500 MG tablet, Take 1,000 mg by mouth every 6 (six) hours as needed (for pain.). , Disp: , Rfl:    alendronate (FOSAMAX) 70 MG tablet, TAKE 1 TABLET EVERY WEEK, Disp: 12 tablet, Rfl: 2   ALPRAZolam (XANAX) 0.5 MG tablet, Take 1 tablet (0.5 mg total) by mouth at bedtime., Disp: 90 tablet, Rfl: 1   Biotin 1000 MCG tablet, Take 1,000 mcg by mouth 3 (three) times daily., Disp: , Rfl:    Biotin w/ Vitamins C & E (HAIR SKIN & NAILS GUMMIES PO), Take 2 tablets by mouth daily., Disp: , Rfl:    Calcium Carb-Cholecalciferol (CALCIUM 600 + D PO), Take 1 tablet by mouth 2 (two) times daily., Disp: , Rfl:    cetirizine (ZYRTEC) 10 MG tablet, Take 1 tablet (10 mg total) by mouth daily as needed for allergies., Disp: 90 tablet, Rfl: 3   Cholecalciferol (VITAMIN D) 50 MCG (2000 UT) tablet, Take 2,000 Units by mouth daily., Disp: , Rfl:    Clobetasol Prop Emollient Base (CLOBETASOL PROPIONATE E) 0.05 % emollient cream, Apply 1 application. topically 2 (two) times daily. (Patient taking differently: Apply 1 application  topically 2 (two) times daily as needed (rash).), Disp: 180 g, Rfl: 1   clotrimazole-betamethasone (LOTRISONE) cream, Apply 1 application topically 2 (two) times daily. (Patient taking  differently: Apply 1 application  topically daily as needed (Rash).), Disp: 30 g, Rfl: 0   famotidine (PEPCID) 20 MG tablet, Take 1 tablet (20 mg total) by mouth 2 (two) times daily., Disp: 60 tablet, Rfl: 5   fluorouracil (EFUDEX) 5 % cream, Apply 1 application topically daily as needed (cancer spots). , Disp: , Rfl: 0   fluticasone (FLONASE) 50 MCG/ACT nasal spray, Place 2 sprays into both nostrils daily. (Patient taking differently: Place 2 sprays  into both nostrils daily as needed for allergies.), Disp: 16 g, Rfl: 6   gabapentin (NEURONTIN) 100 MG capsule, Take 1 capsule (100 mg total) by mouth daily., Disp: 90 capsule, Rfl: 0   hydrocortisone cream 1 %, Apply 1 Application topically 2 (two) times daily as needed for itching., Disp: , Rfl:    levothyroxine (SYNTHROID) 112 MCG tablet, Take 1 tablet (112 mcg total) by mouth daily., Disp: 90 tablet, Rfl: 4   losartan (COZAAR) 25 MG tablet, Take 1 tablet (25 mg total) by mouth daily., Disp: 90 tablet, Rfl: 4   metoprolol succinate (TOPROL XL) 25 MG 24 hr tablet, Take 1 tablet (25 mg total) by mouth daily., Disp: 90 tablet, Rfl: 4   Multiple Vitamins-Minerals (MULTIVITAMIN WITH MINERALS) tablet, Take 1 tablet by mouth daily., Disp: , Rfl:    mupirocin ointment (BACTROBAN) 2 %, Apply 1 application topically daily as needed (Cancer spots)., Disp: 22 g, Rfl: 3   pantoprazole (PROTONIX) 40 MG tablet, TAKE ONE (1) TABLET BY MOUTH TWO (2) TIMES DAILY (Patient taking differently: Take 40 mg by mouth daily.), Disp: 180 tablet, Rfl: 0   Probiotic Product (PROBIOTIC PO), Take 1 capsule by mouth daily., Disp: , Rfl:    silver sulfADIAZINE (SILVADENE) 1 % cream, Apply 1 Application topically daily. Apply to large surface area once to twice daily (Patient taking differently: Apply 1 Application topically daily as needed (skin irritation). Apply to large surface area once to twice daily), Disp: 400 g, Rfl: 1   sirolimus (RAPAMUNE) 1 MG tablet, Take 1 mg by mouth 2  (two) times daily., Disp: , Rfl:    ursodiol (ACTIGALL) 300 MG capsule, Take 3 capsules (900 mg total) by mouth daily., Disp: 90 capsule, Rfl: 5   vitamin C (ASCORBIC ACID) 250 MG tablet, Take 250 mg by mouth daily., Disp: , Rfl:    Vitamin D, Ergocalciferol, (DRISDOL) 1.25 MG (50000 UNIT) CAPS capsule, TAKE 1 CAPSULE BY MOUTH ONCE A WEEK (Patient taking differently: Take 50,000 Units by mouth every Tuesday.), Disp: 12 capsule, Rfl: 3   vitamin E 180 MG (400 UNITS) capsule, Take 400 Units by mouth daily., Disp: , Rfl:    ZORTRESS 0.5 MG TABS, Take 4 tablets (2 mg total) by mouth 2 (two) times daily. (Patient taking differently: Take 1-1.3 mg by mouth See admin instructions. Take 1.5 in the morning and 1 mg at night), Disp: 180 tablet, Rfl: 2   doxycycline (VIBRA-TABS) 100 MG tablet, Take 1 tablet (100 mg total) by mouth 2 (two) times daily., Disp: 20 tablet, Rfl: 0   Objective:   Vitals:   03/29/23 1204  BP: 122/76  Pulse: 90  SpO2: 98%    Physical Exam Vitals reviewed.  Constitutional:      Appearance: Normal appearance.  HENT:     Head: Normocephalic.   Cardiovascular:     Rate and Rhythm: Normal rate.     Pulses: Normal pulses.  Musculoskeletal:        General: Tenderness and signs of injury present. No swelling.  Skin:    General: Skin is warm.     Capillary Refill: Capillary refill takes less than 2 seconds.     Findings: Lesion present.  Neurological:     Mental Status: She is alert and oriented to person, place, and time.  Psychiatric:        Mood and Affect: Mood normal.        Behavior: Behavior normal.        Thought  Content: Thought content normal.        Judgment: Judgment normal.     Assessment & Plan:  Portal vein thrombosis  Hx of liver transplant (HCC)  Lynch syndrome  Immunosuppression (HCC)  Seizures (HCC)  AKI (acute kidney injury) (HCC)  Puncture wound of scalp without foreign body, initial encounter  Patient understands that this is going  to be a process that is going to take some time due to the radiation damage.  I am recommending excision of the wound with placement of either myriad or ACell.  Pictures were obtained of the patient and placed in the chart with the patient's or guardian's permission.   Alena Bills Verlin Uher, DO

## 2023-04-01 ENCOUNTER — Other Ambulatory Visit: Payer: Self-pay

## 2023-04-01 ENCOUNTER — Inpatient Hospital Stay: Payer: Medicare Other | Attending: Hematology

## 2023-04-01 ENCOUNTER — Inpatient Hospital Stay: Payer: Medicare Other

## 2023-04-01 DIAGNOSIS — D500 Iron deficiency anemia secondary to blood loss (chronic): Secondary | ICD-10-CM | POA: Diagnosis not present

## 2023-04-01 DIAGNOSIS — D508 Other iron deficiency anemias: Secondary | ICD-10-CM

## 2023-04-01 DIAGNOSIS — D849 Immunodeficiency, unspecified: Secondary | ICD-10-CM

## 2023-04-01 DIAGNOSIS — K922 Gastrointestinal hemorrhage, unspecified: Secondary | ICD-10-CM | POA: Insufficient documentation

## 2023-04-01 DIAGNOSIS — D638 Anemia in other chronic diseases classified elsewhere: Secondary | ICD-10-CM

## 2023-04-01 DIAGNOSIS — Z944 Liver transplant status: Secondary | ICD-10-CM

## 2023-04-01 LAB — CBC WITH DIFFERENTIAL/PLATELET
Abs Immature Granulocytes: 0.01 10*3/uL (ref 0.00–0.07)
Basophils Absolute: 0.1 10*3/uL (ref 0.0–0.1)
Basophils Relative: 2 %
Eosinophils Absolute: 0.5 10*3/uL (ref 0.0–0.5)
Eosinophils Relative: 9 %
HCT: 44.2 % (ref 36.0–46.0)
Hemoglobin: 13.9 g/dL (ref 12.0–15.0)
Immature Granulocytes: 0 %
Lymphocytes Relative: 33 %
Lymphs Abs: 2 10*3/uL (ref 0.7–4.0)
MCH: 29.6 pg (ref 26.0–34.0)
MCHC: 31.4 g/dL (ref 30.0–36.0)
MCV: 94.2 fL (ref 80.0–100.0)
Monocytes Absolute: 0.8 10*3/uL (ref 0.1–1.0)
Monocytes Relative: 14 %
Neutro Abs: 2.6 10*3/uL (ref 1.7–7.7)
Neutrophils Relative %: 42 %
Platelets: 250 10*3/uL (ref 150–400)
RBC: 4.69 MIL/uL (ref 3.87–5.11)
RDW: 14.1 % (ref 11.5–15.5)
WBC: 6.1 10*3/uL (ref 4.0–10.5)
nRBC: 0 % (ref 0.0–0.2)

## 2023-04-01 LAB — COMPREHENSIVE METABOLIC PANEL
ALT: 33 U/L (ref 0–44)
AST: 40 U/L (ref 15–41)
Albumin: 3.5 g/dL (ref 3.5–5.0)
Alkaline Phosphatase: 325 U/L — ABNORMAL HIGH (ref 38–126)
Anion gap: 7 (ref 5–15)
BUN: 24 mg/dL — ABNORMAL HIGH (ref 8–23)
CO2: 28 mmol/L (ref 22–32)
Calcium: 9.4 mg/dL (ref 8.9–10.3)
Chloride: 99 mmol/L (ref 98–111)
Creatinine, Ser: 0.99 mg/dL (ref 0.44–1.00)
GFR, Estimated: >60 mL/min (ref >60)
Glucose, Bld: 104 mg/dL — ABNORMAL HIGH (ref 70–99)
Potassium: 5 mmol/L (ref 3.5–5.1)
Sodium: 134 mmol/L — ABNORMAL LOW (ref 135–145)
Total Bilirubin: 0.7 mg/dL (ref 0.3–1.2)
Total Protein: 7.5 g/dL (ref 6.5–8.1)

## 2023-04-01 LAB — IRON AND TIBC
Iron: 93 ug/dL (ref 28–170)
Saturation Ratios: 29 % (ref 10.4–31.8)
TIBC: 323 ug/dL (ref 250–450)
UIBC: 230 ug/dL

## 2023-04-01 LAB — FERRITIN: Ferritin: 77 ng/mL (ref 11–307)

## 2023-04-03 LAB — SIROLIMUS LEVEL: Sirolimus (Rapamycin): 16.7 ng/mL (ref 3.0–20.0)

## 2023-04-04 DIAGNOSIS — L570 Actinic keratosis: Secondary | ICD-10-CM | POA: Diagnosis not present

## 2023-04-04 DIAGNOSIS — B078 Other viral warts: Secondary | ICD-10-CM | POA: Diagnosis not present

## 2023-04-04 DIAGNOSIS — Z1509 Genetic susceptibility to other malignant neoplasm: Secondary | ICD-10-CM | POA: Diagnosis not present

## 2023-04-04 DIAGNOSIS — C4402 Squamous cell carcinoma of skin of lip: Secondary | ICD-10-CM | POA: Diagnosis not present

## 2023-04-04 DIAGNOSIS — D0472 Carcinoma in situ of skin of left lower limb, including hip: Secondary | ICD-10-CM | POA: Diagnosis not present

## 2023-04-04 DIAGNOSIS — D485 Neoplasm of uncertain behavior of skin: Secondary | ICD-10-CM | POA: Diagnosis not present

## 2023-04-04 DIAGNOSIS — L538 Other specified erythematous conditions: Secondary | ICD-10-CM | POA: Diagnosis not present

## 2023-04-06 ENCOUNTER — Other Ambulatory Visit: Payer: Self-pay | Admitting: Family

## 2023-04-06 DIAGNOSIS — M5432 Sciatica, left side: Secondary | ICD-10-CM

## 2023-04-10 DIAGNOSIS — D849 Immunodeficiency, unspecified: Secondary | ICD-10-CM | POA: Diagnosis not present

## 2023-04-10 DIAGNOSIS — Z944 Liver transplant status: Secondary | ICD-10-CM | POA: Diagnosis not present

## 2023-04-12 NOTE — Progress Notes (Unsigned)
VIRTUAL VISIT via TELEPHONE NOTE [facility]   I connected with Cassidy Barber  on 04/15/23 at 3:00 PM by telephone and verified that I am speaking with the correct person using two identifiers.  Location: Patient: Home Provider: [facility]   I discussed the limitations, risks, security and privacy concerns of performing an evaluation and management service by telephone and the availability of in person appointments. I also discussed with the patient that there may be a patient responsible charge related to this service. The patient expressed understanding and agreed to proceed.  REASON FOR VISIT: Iron deficiency anemia   CURRENT THERAPY: Intermittent IV iron  INTERVAL HISTORY:  [name] is contacted today for follow-up of her iron deficiency anemia.  She was last evaluated via telemedicine visit by Rojelio Brenner PA-C in clinic by Rojelio Brenner PA-C on 01/10/2023. She last received Venofer 300 mg x 3 in December 2023, and reports feeling unchanged after IV iron.  She continues to have fatigue with energy that "comes and goes," currently at about 40%.  She tires easily.  She continues to have ongoing loose, dark bowel movements which have been chronic ever since her colectomy.  This is unchanged, despite most recent EGD/APC treatment of GAVE on 06/04/2022.   No gross hematochezia, epistaxis, or other source of blood loss.  She been having some mild dyspnea on exertion.  No pica, restless legs, chest pain, lightheadedness, or syncope.  She reports 40% energy and 85% appetite.  She is maintaining a stable weight at this time.  REVIEW OF SYSTEMS:   Review of Systems  Constitutional:  Positive for malaise/fatigue. Negative for chills, diaphoresis, fever and weight loss.  Respiratory:  Negative for cough and shortness of breath.   Cardiovascular:  Negative for chest pain and palpitations.  Gastrointestinal:  Negative for abdominal pain, blood in  stool, melena, nausea and vomiting.  Musculoskeletal:  Positive for back pain.  Neurological:  Negative for dizziness and headaches.     PHYSICAL EXAM: (per limitations of virtual telephone visit)  The patient is alert and oriented x 3, exhibiting adequate mentation, good mood, and ability to speak in full sentences and execute sound judgement.  ASSESSMENT & PLAN:  1.  Iron deficiency anemia from occult GI blood loss: - Unable to tolerate oral iron. - Secondary to history of colon cancer s/p abdominal colectomy, malabsorption, and chronic blood loss from GAVE -  Most recent EGD (02/26/2023) showed GAVE without bleeding, treated with APC. - Most recent sigmoidoscopy (02/26/2023): Patent end-to-side ileocolonic anastomosis with healthy-appearing mucosa; nonbleeding internal hemorrhoids - SPEP negative in 2019 - She last received IV iron (Venofer 300 mg x 3) in December 2023  - Currently symptomatic with ongoing fatigue and mild DOE - no improvement in symptoms after IV iron - Dark stools ever since her colectomy.  Denies any epistaxis or rectal hemorrhage - Most recent labs (04/01/2023 via [facility]): Hgb 13.9/MCV 94.2, ferritin 77, iron saturation 29% - PLAN: We will hold off on any IV iron at this time.  Patient agrees. - Labs in 3 months, phone visit 1 week after  - Continue follow-up with GI (Dr. Levon Hedger) for management of any GI blood loss   2.  Vitamin B12 deficiency  -She was previously taking Vitamin B12 tablet (2000 mcg) once daily (until February 2024) - Labs from 01/10/2023 showed elevated B12 at 1563, therefore OTC supplement was stopped by her PCP.  - PLAN: We will recheck B12/MMA  at follow-up visit.  3.  Liver Transplant: - Had this completed at [facility] in January 2015.  - She is on immunosuppression with sirolimus.   4.  Skin cancer on her scalp: - Secondary to chronic immunosuppression for liver transplant; lynch syndrome - She is status post 30 treatments of radiation to her  scalp. - She is followed by dermatology and has had several areas treated recently with topical creams.   5.  History of colon cancer with [facility] SYNDROME: - Status post abdominal colectomy on 09/17/2011 with simultaneous total abdominal hysterectomy and bilateral salpingo-oophorectomy. - Found to have Lynch syndrome. - Per recommendations she is to have an EGD every 2 to 3 years and annual colonoscopy for review of her residual colon tissue at the anastomosis. - Sigmoidoscopy (02/15/2021): Two nonbleeding erosions at ileocolonic anastomosis, small external hemorrhoids  PLAN SUMMARY: >> Labs in 3 months =  CBC/D, ferritin, iron/TIBC, vitamin B12, MMA >> PHONE visit in 3 months (1 week after labs)     I discussed the assessment and treatment plan with the patient. The patient was provided an opportunity to ask questions and all were answered. The patient agreed with the plan and demonstrated an understanding of the instructions.   The patient was advised to call back or seek an in-person evaluation if the symptoms worsen or if the condition fails to improve as anticipated.  I provided 18 minutes of non-face-to-face time during this encounter.  Carnella Guadalajara, PA-C 04/15/2023 4:07 PM

## 2023-04-15 ENCOUNTER — Inpatient Hospital Stay: Payer: Medicare Other | Attending: Hematology | Admitting: Physician Assistant

## 2023-04-15 DIAGNOSIS — D500 Iron deficiency anemia secondary to blood loss (chronic): Secondary | ICD-10-CM | POA: Diagnosis not present

## 2023-04-17 ENCOUNTER — Other Ambulatory Visit: Payer: Self-pay | Admitting: Family

## 2023-04-19 ENCOUNTER — Telehealth: Payer: Self-pay | Admitting: Plastic Surgery

## 2023-04-19 NOTE — Telephone Encounter (Signed)
Provider change to Dr. Ulice Bold for auth# [id number] per Maximiano Coss.  Call ref# [id number] YQM#[id number]

## 2023-04-23 ENCOUNTER — Other Ambulatory Visit (INDEPENDENT_AMBULATORY_CARE_PROVIDER_SITE_OTHER): Payer: Self-pay | Admitting: Gastroenterology

## 2023-04-23 DIAGNOSIS — K743 Primary biliary cirrhosis: Secondary | ICD-10-CM

## 2023-04-23 NOTE — Telephone Encounter (Signed)
Last seen jan 2024

## 2023-04-26 ENCOUNTER — Telehealth: Payer: Self-pay | Admitting: Family

## 2023-04-26 DIAGNOSIS — D485 Neoplasm of uncertain behavior of skin: Secondary | ICD-10-CM | POA: Diagnosis not present

## 2023-04-26 DIAGNOSIS — C44622 Squamous cell carcinoma of skin of right upper limb, including shoulder: Secondary | ICD-10-CM | POA: Diagnosis not present

## 2023-04-26 DIAGNOSIS — C44629 Squamous cell carcinoma of skin of left upper limb, including shoulder: Secondary | ICD-10-CM | POA: Diagnosis not present

## 2023-04-26 DIAGNOSIS — Z1509 Genetic susceptibility to other malignant neoplasm: Secondary | ICD-10-CM | POA: Diagnosis not present

## 2023-04-26 NOTE — Telephone Encounter (Signed)
Called and spoke with patient she is going to call and see if [facility] do a MRI ordered from [facility].

## 2023-04-26 NOTE — Telephone Encounter (Signed)
Pt called requesting to speak to PCPs nurse regarding MRI.

## 2023-04-29 ENCOUNTER — Other Ambulatory Visit: Payer: Self-pay | Admitting: Transplant Hepatology

## 2023-04-29 ENCOUNTER — Encounter: Payer: Self-pay | Admitting: Transplant Hepatology

## 2023-04-29 DIAGNOSIS — Z944 Liver transplant status: Secondary | ICD-10-CM

## 2023-04-29 DIAGNOSIS — R748 Abnormal levels of other serum enzymes: Secondary | ICD-10-CM

## 2023-04-30 ENCOUNTER — Telehealth: Payer: Self-pay | Admitting: *Deleted

## 2023-04-30 DIAGNOSIS — D849 Immunodeficiency, unspecified: Secondary | ICD-10-CM | POA: Diagnosis not present

## 2023-04-30 DIAGNOSIS — Z944 Liver transplant status: Secondary | ICD-10-CM | POA: Diagnosis not present

## 2023-04-30 NOTE — Telephone Encounter (Signed)
Request for surgical clearance faxed to Jannifer Rodney, FNP with confirmation received. [phone]

## 2023-05-01 ENCOUNTER — Encounter: Payer: Self-pay | Admitting: Student

## 2023-05-01 ENCOUNTER — Ambulatory Visit (INDEPENDENT_AMBULATORY_CARE_PROVIDER_SITE_OTHER): Payer: Medicare Other | Admitting: Student

## 2023-05-01 VITALS — BP 127/78 | HR 68 | Ht 64.0 in | Wt 138.8 lb

## 2023-05-01 DIAGNOSIS — S0103XA Puncture wound without foreign body of scalp, initial encounter: Secondary | ICD-10-CM

## 2023-05-01 DIAGNOSIS — M952 Other acquired deformity of head: Secondary | ICD-10-CM

## 2023-05-01 MED ORDER — HYDROCODONE-ACETAMINOPHEN 5-325 MG PO TABS: 0.5000 | ORAL_TABLET | Freq: Four times a day (QID) | ORAL | 0 refills | Status: DC | PRN

## 2023-05-01 MED ORDER — ONDANSETRON HCL 4 MG PO TABS: 4.0000 mg | ORAL_TABLET | Freq: Three times a day (TID) | ORAL | 0 refills | Status: DC | PRN

## 2023-05-01 MED ORDER — CEPHALEXIN 500 MG PO CAPS: 500.0000 mg | ORAL_CAPSULE | Freq: Four times a day (QID) | ORAL | 0 refills | Status: AC

## 2023-05-01 NOTE — H&P (View-Only) (Signed)
   Patient ID: Cassidy Barber, female    DOB: 07/22/1951, 72 y.o.   MRN: [id number]  Chief Complaint  Patient presents with   Pre-op Exam      ICD-10-CM   1. Puncture wound of scalp without foreign body, initial encounter  S01.03XA        History of Present Illness: Cassidy Barber is a 72 y.o.  female  with a history of skin cancer.  She presents for preoperative evaluation for upcoming procedure, excision of scalp wound with myriad or ACell placement, scheduled for 05/13/2023 with Dr. Dillingham.  Patient presents to today's appointment with her husband at bedside.  The patient has not had problems with anesthesia.  Patient denies any cardiac issues.  She denies seeing a cardiologist.  She denies taking any blood thinners.  Patient reports she is not a smoker.  Patient denies taking any birth control or hormone replacement.  She denies any history of miscarriages.  She denies any personal or family history of blood clots or clotting diseases.  She denies any recent surgeries, traumas, infections or major medical events.  She denies any history of stroke or heart attack.  Patient denies any history of Crohn's disease or ulcerative colitis.  She denies any history of COPD or asthma.  She reports history of skin cancer and colon cancer.  She denies any varicosities to her lower extremities.  She denies any recent fevers, chills or changes in her health.  Patient reports she has history of iron deficiency anemia.  She states that she had a CBC completed yesterday, which showed her hemoglobin was 14.3.  Patient states that she used to go to pain management, but has not seen them in a very long time.  Summary of Previous Visit: Patient was seen for consult by Dr. Dillingham on 03/29/2023.  At this visit, patient presented for evaluation of her skull.  Patient reported she had skin cancer of her scalp that was excised and radiated which led to a 1 x 2 cm wound.  It appeared the skull was exposed and  did not have any periosteum over it.  There was about a 3 cm x 3 cm area of no hair where the radiation effect took place.  Patient also reported she underwent liver transplant around 2015 and is being managed at [facility] for this.  Patient is currently on immunosuppressive therapy.  Plan was to move forward with excision of the wound with placement of either myriad or ACell.  It was discussed with the patient that this would be a process that is going to take some time due to the radiation damage.  Job: Does not work at this time  PMH Significant for: Hypertension, GERD, colon cancer, cirrhosis, AKI, history of liver transplant   Past Medical History: Allergies: Allergies  Allergen Reactions   Ciprofloxacin Itching   Codeine Nausea Only    Current Medications:  Current Outpatient Medications:    acetaminophen (TYLENOL) 500 MG tablet, Take 1,000 mg by mouth every 6 (six) hours as needed (for pain.). , Disp: , Rfl:    alendronate (FOSAMAX) 70 MG tablet, TAKE 1 TABLET EVERY WEEK, Disp: 12 tablet, Rfl: 2   ALPRAZolam (XANAX) 0.5 MG tablet, Take 1 tablet (0.5 mg total) by mouth at bedtime., Disp: 90 tablet, Rfl: 1   Biotin 1000 MCG tablet, Take 1,000 mcg by mouth 3 (three) times daily., Disp: , Rfl:    Biotin w/ Vitamins C & E (HAIR SKIN & NAILS   GUMMIES PO), Take 2 tablets by mouth daily., Disp: , Rfl:    Calcium Carb-Cholecalciferol (CALCIUM 600 + D PO), Take 1 tablet by mouth 2 (two) times daily., Disp: , Rfl:    cetirizine (ZYRTEC) 10 MG tablet, Take 1 tablet (10 mg total) by mouth daily as needed for allergies., Disp: 90 tablet, Rfl: 3   Cholecalciferol (VITAMIN D) 50 MCG (2000 UT) tablet, Take 2,000 Units by mouth daily., Disp: , Rfl:    Clobetasol Prop Emollient Base (CLOBETASOL PROPIONATE E) 0.05 % emollient cream, Apply 1 application. topically 2 (two) times daily. (Patient taking differently: Apply 1 application  topically 2 (two) times daily as needed (rash).), Disp: 180 g, Rfl: 1    clotrimazole-betamethasone (LOTRISONE) cream, Apply 1 application topically 2 (two) times daily. (Patient taking differently: Apply 1 application  topically daily as needed (Rash).), Disp: 30 g, Rfl: 0   doxycycline (VIBRA-TABS) 100 MG tablet, Take 1 tablet (100 mg total) by mouth 2 (two) times daily., Disp: 20 tablet, Rfl: 0   famotidine (PEPCID) 20 MG tablet, Take 1 tablet (20 mg total) by mouth 2 (two) times daily., Disp: 60 tablet, Rfl: 5   fluorouracil (EFUDEX) 5 % cream, Apply 1 application topically daily as needed (cancer spots). , Disp: , Rfl: 0   fluticasone (FLONASE) 50 MCG/ACT nasal spray, Place 2 sprays into both nostrils daily. (Patient taking differently: Place 2 sprays into both nostrils daily as needed for allergies.), Disp: 16 g, Rfl: 6   gabapentin (NEURONTIN) 100 MG capsule, Take 1 capsule (100 mg total) by mouth daily., Disp: 90 capsule, Rfl: 0   hydrocortisone cream 1 %, Apply 1 Application topically 2 (two) times daily as needed for itching., Disp: , Rfl:    levothyroxine (SYNTHROID) 112 MCG tablet, TAKE ONE (1) TABLET BY MOUTH EVERY DAY, Disp: 90 tablet, Rfl: 2   losartan (COZAAR) 25 MG tablet, Take 1 tablet (25 mg total) by mouth daily., Disp: 90 tablet, Rfl: 4   metoprolol succinate (TOPROL XL) 25 MG 24 hr tablet, Take 1 tablet (25 mg total) by mouth daily., Disp: 90 tablet, Rfl: 4   Multiple Vitamins-Minerals (MULTIVITAMIN WITH MINERALS) tablet, Take 1 tablet by mouth daily., Disp: , Rfl:    mupirocin ointment (BACTROBAN) 2 %, Apply 1 application topically daily as needed (Cancer spots)., Disp: 22 g, Rfl: 3   pantoprazole (PROTONIX) 40 MG tablet, TAKE ONE (1) TABLET BY MOUTH TWO (2) TIMES DAILY (Patient taking differently: Take 40 mg by mouth daily.), Disp: 180 tablet, Rfl: 0   Probiotic Product (PROBIOTIC PO), Take 1 capsule by mouth daily., Disp: , Rfl:    silver sulfADIAZINE (SILVADENE) 1 % cream, Apply 1 Application topically daily. Apply to large surface area once to  twice daily (Patient taking differently: Apply 1 Application topically daily as needed (skin irritation). Apply to large surface area once to twice daily), Disp: 400 g, Rfl: 1   sirolimus (RAPAMUNE) 1 MG tablet, Take 1 mg by mouth 2 (two) times daily., Disp: , Rfl:    ursodiol (ACTIGALL) 300 MG capsule, TAKE 3 CAPSULES BY MOUTH DAILY, Disp: 90 capsule, Rfl: 5   vitamin C (ASCORBIC ACID) 250 MG tablet, Take 250 mg by mouth daily., Disp: , Rfl:    Vitamin D, Ergocalciferol, (DRISDOL) 1.25 MG (50000 UNIT) CAPS capsule, TAKE 1 CAPSULE BY MOUTH ONCE A WEEK, Disp: 12 capsule, Rfl: 0   vitamin E 180 MG (400 UNITS) capsule, Take 400 Units by mouth daily., Disp: , Rfl:      ZORTRESS 0.5 MG TABS, Take 4 tablets (2 mg total) by mouth 2 (two) times daily. (Patient taking differently: Take 1-1.3 mg by mouth See admin instructions. Take 1.5 in the morning and 1 mg at night), Disp: 180 tablet, Rfl: 2  Past Medical Problems: Past Medical History:  Diagnosis Date   Abdominal wall hernia    Incarcerated status post surgical repair 2019 - [facility]   Anemia of chronic disease    Atypical nevus 01/21/2018   atypical neoplasm- Left scalp-ant (txpbx + MOHS), atypical neoplasm- Left scalp post- (txpbx + MOHS)   Basal cell carcinoma    Colon cancer (HCC)    Colon surgery 2005 and 2012   History of pulmonary hypertension    Pre liver transplant   Hypertension    Hypothyroidism    Lynch syndrome    Osteopenia    Primary biliary cirrhosis (HCC)    Status post liver transplantation - follows at [facility]   SCCA (squamous cell carcinoma) of skin 02/23/2020   Right Upper Chest(moderate) (MOH's)   SCCA (squamous cell carcinoma) of skin 04/07/2020   Left Top Leg (Keratoacanthoma) treatment after biopsy   SCCA (squamous cell carcinoma) of skin 04/07/2020   Left Foot Dorsal (in situ) treatment after biopsy   SCCA (squamous cell carcinoma) of skin 03/27/2021   Right Upper Back (Keratoacanthoma) (excision) (clear)   SCCA  (squamous cell carcinoma) of skin 06/13/2021   Right Shoulder - anterior (moderately differentiated) (tx p bx)   SCCA (squamous cell carcinoma) of skin 06/13/2021   Right Thigh - anterior (well differentiated) (tx p bx)   SCCA (squamous cell carcinoma) of skin 06/13/2021   Right Lower Leg - anterior (well differentiated) (tx p bx)   Squamous cell carcinoma of skin 04/22/2018   KA-Right mid chest (txpbx), KA-left elbow crease (txpbx), insitu-Right mid chest inf. (exc)   Squamous cell carcinoma of skin 05/20/2018   well diff-Left upper shin (txpbx), well diff-Right lower forearm (txpbx), well diff-Right upper shin (txpbx)   Squamous cell carcinoma of skin 06/11/2018   Scc + margin-Right mid chest inferior    Squamous cell carcinoma of skin 06/27/2018   well diff-Left mid thigh(txpbx), well diff-Left inner thigh (txpbx),insitu-Right cheek (txpbx),well diff-right inner heel (txpbx)   Squamous cell carcinoma of skin 09/16/2018   well diff-left shoulder (txpbx), well diff-Right chin (txpbx), well diff-right chest lateral (CX35FU)   Squamous cell carcinoma of skin 10/01/2018   Right outer lower shin (Txpbx)   Squamous cell carcinoma of skin 04/03/2019   well diff-Right center chest (MOHS), in situ-Right ear   Squamous cell carcinoma of skin 08/05/2019   in situ-Left calf (txpbx), in situ-left bicep (txpbx), well diff-Left chest,inf(txpbx), in situ-Right chest inf-(txpbx)   Squamous cell carcinoma of skin 11/19/2019   KA-left top leg (txpbx), modify-Riight forehead-(mohs), in situ-right hand (txpbx), in situ-Right forearm (txpbx), well diff-Right chest (txpbx), well diff-chin (txpbx)   Squamous cell carcinoma of skin 01/06/2020   KA- Left top leg   Squamous cell carcinoma of skin 05/12/2013   bowens-middle of chest (CX35FU)   Squamous cell carcinoma of skin 05/18/2015   well diff-Left upper arm (CX35FU + Exc),KA-right chest(txpbx), in situ-Left shin (txpbx), well diff-Right cheek (CX35FU),  KA-Left post scalp (CX35FU)   Squamous cell carcinoma of skin 08/09/2015   KA-Left post scalp ((MOHS), in situ- mid chest (Txpbx +exc), in situ-Left upper arm inferior (txpbx)   Squamous cell carcinoma of skin 10/13/2015   Left upper arm-clear   Squamous cell carcinoma of skin   03/09/2016   mod diff-mid chest (txpbx+ exc), mod diff-Right chest (txpbx+exc), well diff-right cheek-(txpbx),well diff-Left hand-(txpbx), in situ-Left upper arm (txpbx), well diff-Right cheek -(txpbx), well diff-Right crease arm (txpbx)    Squamous cell carcinoma of skin 05/24/2016   well diff-Right nasal crease-(MOHS)   Squamous cell carcinoma of skin 08/02/2016   KA-Left chest med (txpbx)   Squamous cell carcinoma of skin 08/30/2016   in situ-Left outer zygoma (txpbx)   Squamous cell carcinoma of skin 12/06/2016   well diff-Left chest sup, Left shoudler, insitu- right post scalp   Squamous cell carcinoma of skin 02/14/2017   well diff-Left forearm (EXC),in situ-RIght ant neck   Squamous cell carcinoma of skin 06/14/2017   in situ-Right forearm (txpbx), in situ-Right chest (txpbx), well diff-left chest (txpbx), well diff-anterior neck- (txpbx)   Squamous cell carcinoma of skin 08/07/2017   well diff-Left upper shoulder (txpbx), sup and invasive-Left temple (txpbx), well diff-Right upper shin (txpbx), in situ-Right clavicle (txpbx)   Squamous cell carcinoma of skin 10/17/2017   well diff-ant. neck (MOHS), in situ-Right chest, inf (txpbx)   Squamous cell carcinoma of skin 01/21/2018   well diff- Right chest,ulnar (txpbx), well diff- right upper chest (txpbx), in situ-Right ant. crown (txpbx)   Squamous cell carcinoma of skin 08/02/2020   well diff-left lower leg-inferior (Txpbx)   Squamous cell carcinoma of skin 08/02/2020   well diff-right lower leg-mid (txpbx)   Squamous cell carcinoma of skin 08/02/2020   well diff-left chest upper   Squamous cell carcinoma of skin 08/02/2020   well diff-mid parietal scalp  (MOHS)   Squamous cell carcinoma of skin 08/02/2020   well diff-right foot inner(txpbx)   Squamous cell carcinoma of skin 08/02/2020   well diff- left lower leg medial (txpbx)   Squamous cell carcinoma of skin 08/02/2020   well diff-left lower leg anterior (txpbx)   Squamous cell carcinoma of skin 08/02/2020   well diff-left lower leg medial (txpbx)   Squamous cell carcinoma of skin 08/02/2020   well diff-right forearm-posterior (txpbx)    Past Surgical History: Past Surgical History:  Procedure Laterality Date   ABDOMINAL HERNIA REPAIR     Patient's states that she has had 8- 9 hernia surgeries   ABDOMINAL HYSTERECTOMY     BIOPSY  02/15/2021   Procedure: BIOPSY;  Surgeon: Rehman, Najeeb U, MD;  [facility];  Service: Endoscopy;;   BIOPSY  12/20/2021   Procedure: BIOPSY;  Surgeon: Rehman, Najeeb U, MD;  [facility];  Service: Endoscopy;;   CATARACT EXTRACTION W/PHACO Right 01/15/2020   Procedure: CATARACT EXTRACTION PHACO AND INTRAOCULAR LENS PLACEMENT (IOC);  Surgeon: Wrzosek, James, MD;  Location: [facility];  Service: Ophthalmology;  Laterality: Right;  CDE: 7.89   CATARACT EXTRACTION W/PHACO Left 01/29/2020   Procedure: CATARACT EXTRACTION PHACO AND INTRAOCULAR LENS PLACEMENT (IOC) (CDE: 6.33);  Surgeon: Wrzosek, James, MD;  Location: [facility];  Service: Ophthalmology;  Laterality: Left;   CHOLECYSTECTOMY  2007   COLON SURGERY  2008   Done at [facility]   COLONOSCOPY     Done at [facility]   ESOPHAGOGASTRODUODENOSCOPY N/A 08/21/2018   Procedure: ESOPHAGOGASTRODUODENOSCOPY (EGD);  Surgeon: Rehman, Najeeb U, MD;  [facility];  Service: Endoscopy;  Laterality: N/A;   ESOPHAGOGASTRODUODENOSCOPY (EGD) WITH PROPOFOL N/A 12/16/2019   Procedure: ESOPHAGOGASTRODUODENOSCOPY (EGD) WITH PROPOFOL;  Surgeon: Rehman, Najeeb U, MD;  [facility];  Service: Endoscopy;  Laterality: N/A;   ESOPHAGOGASTRODUODENOSCOPY (EGD) WITH PROPOFOL N/A 12/20/2021   Procedure:    ESOPHAGOGASTRODUODENOSCOPY (EGD) WITH PROPOFOL;  Surgeon: Rehman, Najeeb U, MD;  [facility];  Service: Endoscopy;  Laterality: N/A;  9:05   ESOPHAGOGASTRODUODENOSCOPY (EGD) WITH PROPOFOL N/A 02/14/2022   Procedure: ESOPHAGOGASTRODUODENOSCOPY (EGD) WITH PROPOFOL;  Surgeon: Rehman, Najeeb U, MD;  [facility];  Service: Endoscopy;  Laterality: N/A;  730   ESOPHAGOGASTRODUODENOSCOPY (EGD) WITH PROPOFOL N/A 06/04/2022   Procedure: ESOPHAGOGASTRODUODENOSCOPY (EGD) WITH PROPOFOL;  Surgeon: Castaneda Mayorga, Daniel, MD;  [facility];  Service: Gastroenterology;  Laterality: N/A;  145   ESOPHAGOGASTRODUODENOSCOPY (EGD) WITH PROPOFOL N/A 02/26/2023   Procedure: ESOPHAGOGASTRODUODENOSCOPY (EGD) WITH PROPOFOL;  Surgeon: Castaneda Mayorga, Daniel, MD;  [facility];  Service: Gastroenterology;  Laterality: N/A;  9:15AM;ASA 3   EYE SURGERY     lasix   FLEXIBLE SIGMOIDOSCOPY N/A 10/20/2015   Procedure: FLEXIBLE SIGMOIDOSCOPY;  Surgeon: Najeeb U Rehman, MD;  [facility];  Service: Endoscopy;  Laterality: N/A;  115 - Dr Rehman has meeting until 1:00   FLEXIBLE SIGMOIDOSCOPY N/A 07/11/2016   Procedure: FLEXIBLE SIGMOIDOSCOPY;  Surgeon: Najeeb U Rehman, MD;  [facility];  Service: Endoscopy;  Laterality: N/A;  1200   FLEXIBLE SIGMOIDOSCOPY N/A 08/09/2017   Procedure: FLEXIBLE SIGMOIDOSCOPY;  Surgeon: Rehman, Najeeb U, MD;  [facility];  Service: Endoscopy;  Laterality: N/A;  1:00   FLEXIBLE SIGMOIDOSCOPY N/A 08/21/2018   Procedure: FLEXIBLE SIGMOIDOSCOPY;  Surgeon: Rehman, Najeeb U, MD;  [facility];  Service: Endoscopy;  Laterality: N/A;   FLEXIBLE SIGMOIDOSCOPY N/A 12/16/2019   Procedure: FLEXIBLE SIGMOIDOSCOPY wirh Propofol;  Surgeon: Rehman, Najeeb U, MD;  [facility];  Service: Endoscopy;  Laterality: N/A;  7:30   FLEXIBLE SIGMOIDOSCOPY N/A 02/15/2021   Procedure: FLEXIBLE SIGMOIDOSCOPY WITH PROPOFOL;   Surgeon: Rehman, Najeeb U, MD;  [facility];  Service: Endoscopy;  Laterality: N/A;  am   FLEXIBLE SIGMOIDOSCOPY N/A 12/20/2021   Procedure: FLEXIBLE SIGMOIDOSCOPY;  Surgeon: Rehman, Najeeb U, MD;  [facility];  Service: Endoscopy;  Laterality: N/A;   FLEXIBLE SIGMOIDOSCOPY N/A 02/26/2023   Procedure: FLEXIBLE SIGMOIDOSCOPY;  Surgeon: Castaneda Mayorga, Daniel, MD;  [facility];  Service: Gastroenterology;  Laterality: N/A;  9:15AM; ASA 3   FRACTURE SURGERY     right wrist metal plate   GI RADIOFREQUENCY ABLATION N/A 02/14/2022   Procedure: GI RADIOFREQUENCY ABLATION;  Surgeon: Rehman, Najeeb U, MD;  [facility];  Service: Endoscopy;  Laterality: N/A;   HOT HEMOSTASIS N/A 12/20/2021   Procedure: HOT HEMOSTASIS (ARGON PLASMA COAGULATION/BICAP);  Surgeon: Rehman, Najeeb U, MD;  [facility];  Service: Endoscopy;  Laterality: N/A;   HOT HEMOSTASIS  06/04/2022   Procedure: HOT HEMOSTASIS (ARGON PLASMA COAGULATION/BICAP);  Surgeon: Castaneda Mayorga, Daniel, MD;  [facility];  Service: Gastroenterology;;   HOT HEMOSTASIS  02/26/2023   Procedure: HOT HEMOSTASIS (ARGON PLASMA COAGULATION/BICAP);  Surgeon: Castaneda Mayorga, Daniel, MD;  [facility];  Service: Gastroenterology;;   LIVER TRANSPLANT  11/19/2013   POLYPECTOMY  08/09/2017   Procedure: POLYPECTOMY;  Surgeon: Rehman, Najeeb U, MD;  [facility];  Service: Endoscopy;;  colon small bowel   POLYPECTOMY N/A 02/14/2022   Procedure: POLYPECTOMY;  Surgeon: Rehman, Najeeb U, MD;  [facility];  Service: Endoscopy;  Laterality: N/A;   REVERSE SHOULDER ARTHROPLASTY Left 07/17/2018   Procedure: LEFT REVERSE SHOULDER ARTHROPLASTY;  Surgeon: Supple, Kevin, MD;  Location: [facility];  Service: Orthopedics;    Laterality: Left;  120min   REVERSE SHOULDER ARTHROPLASTY Right 07/20/2021   Procedure: REVERSE SHOULDER ARTHROPLASTY;  Surgeon: Supple, Kevin, MD;  Location: [facility];  Service: Orthopedics;  Laterality: Right;   SHOULDER CLOSED REDUCTION Left 09/27/2019   Procedure: CLOSED REDUCTION SHOULDER;  Surgeon: Olin, Matthew, MD;  Location: [facility];  Service: Orthopedics;  Laterality: Left;   SPLENECTOMY  2006   TOTAL SHOULDER REVISION Left 11/12/2019   Procedure: Revision Left Reverse Shoulder Arthroplasty with poly exchange SDD;  Surgeon: Supple, Kevin, MD;  Location: [facility];  Service: Orthopedics;  Laterality: Left;  120min -SDDC   TYMPANOSTOMY TUBE PLACEMENT     UPPER GASTROINTESTINAL ENDOSCOPY     Done at [facility]    Social History: Social History   Socioeconomic History   Marital status: Married    Spouse name: Johnny   Number of children: 1   Years of education: 12   Highest education level: High school graduate  Occupational History   Occupation: Disability    Employer: [employer]    Comment: Adminstrative Assistant  Tobacco Use   Smoking status: Never   Smokeless tobacco: Never  Vaping Use   Vaping Use: Never used  Substance and Sexual Activity   Alcohol use: No    Alcohol/week: 0.0 standard drinks of alcohol   Drug use: No   Sexual activity: Yes    Birth control/protection: None  Other Topics Concern   Not on file  Social History Narrative   Patient lives in a two story home with her husband. She has an adult son. She is retired from being an administrative assistant for 30 years.    Social Determinants of Health   Financial Resource Strain: Low Risk  (07/02/2022)   Overall Financial Resource Strain (CARDIA)    Difficulty of Paying Living Expenses: Not hard at all  Food Insecurity: No Food Insecurity (07/02/2022)   Hunger Vital Sign    Worried About Running Out of Food in the Last Year: Never true    Ran Out of Food in the Last Year: Never true  Transportation Needs: No Transportation Needs (07/02/2022)   PRAPARE - Transportation    Lack of Transportation (Medical): No    Lack of Transportation (Non-Medical): No  Physical  Activity: Insufficiently Active (07/02/2022)   Exercise Vital Sign    Days of Exercise per Week: 7 days    Minutes of Exercise per Session: 10 min  Stress: No Stress Concern Present (07/02/2022)   [employer] - Occupational Stress Questionnaire    Feeling of Stress : Not at all  Social Connections: Socially Integrated (07/02/2022)   Social Connection and Isolation Panel [NHANES]    Frequency of Communication with Friends and Family: More than three times a week    Frequency of Social Gatherings with Friends and Family: More than three times a week    Attends Religious Services: More than 4 times per year    Active Member of Clubs or Organizations: Yes    Attends Club or Organization Meetings: More than 4 times per year    Marital Status: Married  Intimate Partner Violence: Not At Risk (07/02/2022)   Humiliation, Afraid, Rape, and Kick questionnaire    Fear of Current or Ex-Partner: No    Emotionally Abused: No    Physically Abused: No    Sexually Abused: No    Family History: Family History  Problem Relation Age of Onset   Prostate cancer Father    Colon cancer Father      Colon cancer Sister    Lung cancer Sister    Healthy Son    Alcohol abuse Brother    Allergic rhinitis Neg Hx    Asthma Neg Hx    Eczema Neg Hx    Urticaria Neg Hx     Review of Systems: Denies any recent fevers, chills or changes in her health  Physical Exam: Vital Signs BP 127/78 (BP Location: Left Arm, Patient Position: Sitting, Cuff Size: Large)   Pulse 68   Ht 5' 4" (1.626 m)   Wt 138 lb 12.8 oz (63 kg)   SpO2 97%   BMI 23.82 kg/m   Physical Exam  Constitutional:      General: Not in acute distress.    Appearance: Normal appearance. Not ill-appearing.  HENT:     Head: Normocephalic and atraumatic.  Eyes:     Pupils: Pupils are equal, round Neck:     Musculoskeletal: Normal range of motion.  Cardiovascular:     Rate and Rhythm: Normal rate Pulmonary:      Effort: Pulmonary effort is normal. No respiratory distress.  Musculoskeletal: Normal range of motion.  Skin:    General: Skin is warm and dry.     Findings: No erythema or rash.  Neurological:     Mental Status: Alert and oriented to person, place, and time. Mental status is at baseline.  Psychiatric:        Mood and Affect: Mood normal.        Behavior: Behavior normal.    Assessment/Plan: The patient is scheduled for excision of scalp wound with placement of myriad or ACell with Dr. Dillingham.  Risks, benefits, and alternatives of procedure discussed, questions answered and consent obtained.    Will obtain PCP clearance and clearance from patient's transplant hepatologist at [facility].  Smoking Status: Non-smoker; Counseling Given?  N/A  Caprini Score: 6; Risk Factors include: Age, history of malignancy, and length of planned surgery. Recommendation for mechanical prophylaxis. Encourage early ambulation.   Pictures obtained: @consult  Post-op Rx sent to pharmacy:  Norco, Zofran, Keflex  I instructed the patient to hold her Xanax, gabapentin and losartan the morning of surgery.  I instructed to hold any vitamins, multivitamins, supplements at least 1 week prior to surgery.  I discussed with the patient that her sirolimus medication may put her at increased risk of developing wound healing issues.  I discussed with her that we will see if we can get clearance from her provider at [facility] to hold this medication before and after surgery.  Patient expressed understanding and was in agreement with this.  Patient was provided with the General Surgical Risk consent document and Pain Medication Agreement prior to their appointment.  They had adequate time to read through the risk consent documents and Pain Medication Agreement. We also discussed them in person together during this preop appointment. All of their questions were answered to their satisfaction.  Recommended calling if they have any  further questions.  Risk consent form and Pain Medication Agreement to be scanned into patient's chart.  I discussed with the patient that given her history of being on immunosuppressant therapy as well as her history of radiation to her scalp, she may experience delayed wound healing.  Patient expressed understanding.   Electronically signed by: Ziv Welchel E Yehudah Standing, PA-C 05/01/2023 4:07 PM  

## 2023-05-01 NOTE — Progress Notes (Signed)
Patient ID: Cassidy Barber, female    DOB: September 21, 1951, 72 y.o.   MRN: [id number]  Chief Complaint  Patient presents with   Pre-op Exam      ICD-10-CM   1. Puncture wound of scalp without foreign body, initial encounter  S01.03XA        History of Present Illness: Cassidy Barber is a 72 y.o.  female  with a history of skin cancer.  She presents for preoperative evaluation for upcoming procedure, excision of scalp wound with myriad or ACell placement, scheduled for 05/13/2023 with Dr. Ulice Bold.  Patient presents to today's appointment with her husband at bedside.  The patient has not had problems with anesthesia.  Patient denies any cardiac issues.  She denies seeing a cardiologist.  She denies taking any blood thinners.  Patient reports she is not a smoker.  Patient denies taking any birth control or hormone replacement.  She denies any history of miscarriages.  She denies any personal or family history of blood clots or clotting diseases.  She denies any recent surgeries, traumas, infections or major medical events.  She denies any history of stroke or heart attack.  Patient denies any history of Crohn's disease or ulcerative colitis.  She denies any history of COPD or asthma.  She reports history of skin cancer and colon cancer.  She denies any varicosities to her lower extremities.  She denies any recent fevers, chills or changes in her health.  Patient reports she has history of iron deficiency anemia.  She states that she had a CBC completed yesterday, which showed her hemoglobin was 14.3.  Patient states that she used to go to pain management, but has not seen them in a very long time.  Summary of Previous Visit: Patient was seen for consult by Dr. Ulice Bold on 03/29/2023.  At this visit, patient presented for evaluation of her skull.  Patient reported she had skin cancer of her scalp that was excised and radiated which led to a 1 x 2 cm wound.  It appeared the skull was exposed and  did not have any periosteum over it.  There was about a 3 cm x 3 cm area of no hair where the radiation effect took place.  Patient also reported she underwent liver transplant around 2015 and is being managed at [facility] for this.  Patient is currently on immunosuppressive therapy.  Plan was to move forward with excision of the wound with placement of either myriad or ACell.  It was discussed with the patient that this would be a process that is going to take some time due to the radiation damage.  Job: Does not work at this time  PMH Significant for: Hypertension, GERD, colon cancer, cirrhosis, AKI, history of liver transplant   Past Medical History: Allergies: Allergies  Allergen Reactions   Ciprofloxacin Itching   Codeine Nausea Only    Current Medications:  Current Outpatient Medications:    acetaminophen (TYLENOL) 500 MG tablet, Take 1,000 mg by mouth every 6 (six) hours as needed (for pain.). , Disp: , Rfl:    alendronate (FOSAMAX) 70 MG tablet, TAKE 1 TABLET EVERY WEEK, Disp: 12 tablet, Rfl: 2   ALPRAZolam (XANAX) 0.5 MG tablet, Take 1 tablet (0.5 mg total) by mouth at bedtime., Disp: 90 tablet, Rfl: 1   Biotin 1000 MCG tablet, Take 1,000 mcg by mouth 3 (three) times daily., Disp: , Rfl:    Biotin w/ Vitamins C & E (HAIR SKIN & NAILS  GUMMIES PO), Take 2 tablets by mouth daily., Disp: , Rfl:    Calcium Carb-Cholecalciferol (CALCIUM 600 + D PO), Take 1 tablet by mouth 2 (two) times daily., Disp: , Rfl:    cetirizine (ZYRTEC) 10 MG tablet, Take 1 tablet (10 mg total) by mouth daily as needed for allergies., Disp: 90 tablet, Rfl: 3   Cholecalciferol (VITAMIN D) 50 MCG (2000 UT) tablet, Take 2,000 Units by mouth daily., Disp: , Rfl:    Clobetasol Prop Emollient Base (CLOBETASOL PROPIONATE E) 0.05 % emollient cream, Apply 1 application. topically 2 (two) times daily. (Patient taking differently: Apply 1 application  topically 2 (two) times daily as needed (rash).), Disp: 180 g, Rfl: 1    clotrimazole-betamethasone (LOTRISONE) cream, Apply 1 application topically 2 (two) times daily. (Patient taking differently: Apply 1 application  topically daily as needed (Rash).), Disp: 30 g, Rfl: 0   doxycycline (VIBRA-TABS) 100 MG tablet, Take 1 tablet (100 mg total) by mouth 2 (two) times daily., Disp: 20 tablet, Rfl: 0   famotidine (PEPCID) 20 MG tablet, Take 1 tablet (20 mg total) by mouth 2 (two) times daily., Disp: 60 tablet, Rfl: 5   fluorouracil (EFUDEX) 5 % cream, Apply 1 application topically daily as needed (cancer spots). , Disp: , Rfl: 0   fluticasone (FLONASE) 50 MCG/ACT nasal spray, Place 2 sprays into both nostrils daily. (Patient taking differently: Place 2 sprays into both nostrils daily as needed for allergies.), Disp: 16 g, Rfl: 6   gabapentin (NEURONTIN) 100 MG capsule, Take 1 capsule (100 mg total) by mouth daily., Disp: 90 capsule, Rfl: 0   hydrocortisone cream 1 %, Apply 1 Application topically 2 (two) times daily as needed for itching., Disp: , Rfl:    levothyroxine (SYNTHROID) 112 MCG tablet, TAKE ONE (1) TABLET BY MOUTH EVERY DAY, Disp: 90 tablet, Rfl: 2   losartan (COZAAR) 25 MG tablet, Take 1 tablet (25 mg total) by mouth daily., Disp: 90 tablet, Rfl: 4   metoprolol succinate (TOPROL XL) 25 MG 24 hr tablet, Take 1 tablet (25 mg total) by mouth daily., Disp: 90 tablet, Rfl: 4   Multiple Vitamins-Minerals (MULTIVITAMIN WITH MINERALS) tablet, Take 1 tablet by mouth daily., Disp: , Rfl:    mupirocin ointment (BACTROBAN) 2 %, Apply 1 application topically daily as needed (Cancer spots)., Disp: 22 g, Rfl: 3   pantoprazole (PROTONIX) 40 MG tablet, TAKE ONE (1) TABLET BY MOUTH TWO (2) TIMES DAILY (Patient taking differently: Take 40 mg by mouth daily.), Disp: 180 tablet, Rfl: 0   Probiotic Product (PROBIOTIC PO), Take 1 capsule by mouth daily., Disp: , Rfl:    silver sulfADIAZINE (SILVADENE) 1 % cream, Apply 1 Application topically daily. Apply to large surface area once to  twice daily (Patient taking differently: Apply 1 Application topically daily as needed (skin irritation). Apply to large surface area once to twice daily), Disp: 400 g, Rfl: 1   sirolimus (RAPAMUNE) 1 MG tablet, Take 1 mg by mouth 2 (two) times daily., Disp: , Rfl:    ursodiol (ACTIGALL) 300 MG capsule, TAKE 3 CAPSULES BY MOUTH DAILY, Disp: 90 capsule, Rfl: 5   vitamin C (ASCORBIC ACID) 250 MG tablet, Take 250 mg by mouth daily., Disp: , Rfl:    Vitamin D, Ergocalciferol, (DRISDOL) 1.25 MG (50000 UNIT) CAPS capsule, TAKE 1 CAPSULE BY MOUTH ONCE A WEEK, Disp: 12 capsule, Rfl: 0   vitamin E 180 MG (400 UNITS) capsule, Take 400 Units by mouth daily., Disp: , Rfl:  ZORTRESS 0.5 MG TABS, Take 4 tablets (2 mg total) by mouth 2 (two) times daily. (Patient taking differently: Take 1-1.3 mg by mouth See admin instructions. Take 1.5 in the morning and 1 mg at night), Disp: 180 tablet, Rfl: 2  Past Medical Problems: Past Medical History:  Diagnosis Date   Abdominal wall hernia    Incarcerated status post surgical repair 2019 - [facility]   Anemia of chronic disease    Atypical nevus 01/21/2018   atypical neoplasm- Left scalp-ant (txpbx + MOHS), atypical neoplasm- Left scalp post- (txpbx + MOHS)   Basal cell carcinoma    Colon cancer (HCC)    Colon surgery 2005 and 2012   History of pulmonary hypertension    Pre liver transplant   Hypertension    Hypothyroidism    Lynch syndrome    Osteopenia    Primary biliary cirrhosis (HCC)    Status post liver transplantation - follows at [facility]   SCCA (squamous cell carcinoma) of skin 02/23/2020   Right Upper Chest(moderate) (MOH's)   SCCA (squamous cell carcinoma) of skin 04/07/2020   Left Top Leg (Keratoacanthoma) treatment after biopsy   SCCA (squamous cell carcinoma) of skin 04/07/2020   Left Foot Dorsal (in situ) treatment after biopsy   SCCA (squamous cell carcinoma) of skin 03/27/2021   Right Upper Back (Keratoacanthoma) (excision) (clear)   SCCA  (squamous cell carcinoma) of skin 06/13/2021   Right Shoulder - anterior (moderately differentiated) (tx p bx)   SCCA (squamous cell carcinoma) of skin 06/13/2021   Right Thigh - anterior (well differentiated) (tx p bx)   SCCA (squamous cell carcinoma) of skin 06/13/2021   Right Lower Leg - anterior (well differentiated) (tx p bx)   Squamous cell carcinoma of skin 04/22/2018   KA-Right mid chest (txpbx), KA-left elbow crease (txpbx), insitu-Right mid chest inf. (exc)   Squamous cell carcinoma of skin 05/20/2018   well diff-Left upper shin (txpbx), well diff-Right lower forearm (txpbx), well diff-Right upper shin (txpbx)   Squamous cell carcinoma of skin 06/11/2018   Scc + margin-Right mid chest inferior    Squamous cell carcinoma of skin 06/27/2018   well diff-Left mid thigh(txpbx), well diff-Left inner thigh (txpbx),insitu-Right cheek (txpbx),well diff-right inner heel (txpbx)   Squamous cell carcinoma of skin 09/16/2018   well diff-left shoulder (txpbx), well diff-Right chin (txpbx), well diff-right chest lateral (CX35FU)   Squamous cell carcinoma of skin 10/01/2018   Right outer lower shin (Txpbx)   Squamous cell carcinoma of skin 04/03/2019   well diff-Right center chest (MOHS), in situ-Right ear   Squamous cell carcinoma of skin 08/05/2019   in situ-Left calf (txpbx), in situ-left bicep (txpbx), well diff-Left chest,inf(txpbx), in situ-Right chest inf-(txpbx)   Squamous cell carcinoma of skin 11/19/2019   KA-left top leg (txpbx), modify-Riight forehead-(mohs), in situ-right hand (txpbx), in situ-Right forearm (txpbx), well diff-Right chest (txpbx), well diff-chin (txpbx)   Squamous cell carcinoma of skin 01/06/2020   KA- Left top leg   Squamous cell carcinoma of skin 05/12/2013   bowens-middle of chest (CX35FU)   Squamous cell carcinoma of skin 05/18/2015   well diff-Left upper arm (CX35FU + Exc),KA-right chest(txpbx), in situ-Left shin (txpbx), well diff-Right cheek (CX35FU),  KA-Left post scalp (CX35FU)   Squamous cell carcinoma of skin 08/09/2015   KA-Left post scalp ((MOHS), in situ- mid chest (Txpbx +exc), in situ-Left upper arm inferior (txpbx)   Squamous cell carcinoma of skin 10/13/2015   Left upper arm-clear   Squamous cell carcinoma of skin  03/09/2016   mod diff-mid chest (txpbx+ exc), mod diff-Right chest (txpbx+exc), well diff-right cheek-(txpbx),well diff-Left hand-(txpbx), in situ-Left upper arm (txpbx), well diff-Right cheek -(txpbx), well diff-Right crease arm (txpbx)    Squamous cell carcinoma of skin 05/24/2016   well diff-Right nasal crease-(MOHS)   Squamous cell carcinoma of skin 08/02/2016   KA-Left chest med (txpbx)   Squamous cell carcinoma of skin 08/30/2016   in situ-Left outer zygoma (txpbx)   Squamous cell carcinoma of skin 12/06/2016   well diff-Left chest sup, Left shoudler, insitu- right post scalp   Squamous cell carcinoma of skin 02/14/2017   well diff-Left forearm (EXC),in situ-RIght ant neck   Squamous cell carcinoma of skin 06/14/2017   in situ-Right forearm (txpbx), in situ-Right chest (txpbx), well diff-left chest (txpbx), well diff-anterior neck- (txpbx)   Squamous cell carcinoma of skin 08/07/2017   well diff-Left upper shoulder (txpbx), sup and invasive-Left temple (txpbx), well diff-Right upper shin (txpbx), in situ-Right clavicle (txpbx)   Squamous cell carcinoma of skin 10/17/2017   well diff-ant. neck (MOHS), in situ-Right chest, inf (txpbx)   Squamous cell carcinoma of skin 01/21/2018   well diff- Right chest,ulnar (txpbx), well diff- right upper chest (txpbx), in situ-Right ant. crown (txpbx)   Squamous cell carcinoma of skin 08/02/2020   well diff-left lower leg-inferior (Txpbx)   Squamous cell carcinoma of skin 08/02/2020   well diff-right lower leg-mid (txpbx)   Squamous cell carcinoma of skin 08/02/2020   well diff-left chest upper   Squamous cell carcinoma of skin 08/02/2020   well diff-mid parietal scalp  (MOHS)   Squamous cell carcinoma of skin 08/02/2020   well diff-right foot inner(txpbx)   Squamous cell carcinoma of skin 08/02/2020   well diff- left lower leg medial (txpbx)   Squamous cell carcinoma of skin 08/02/2020   well diff-left lower leg anterior (txpbx)   Squamous cell carcinoma of skin 08/02/2020   well diff-left lower leg medial (txpbx)   Squamous cell carcinoma of skin 08/02/2020   well diff-right forearm-posterior (txpbx)    Past Surgical History: Past Surgical History:  Procedure Laterality Date   ABDOMINAL HERNIA REPAIR     Patient's states that she has had 8- 9 hernia surgeries   ABDOMINAL HYSTERECTOMY     BIOPSY  02/15/2021   Procedure: BIOPSY;  Surgeon: Malissa Hippo, MD;  [facility];  Service: Endoscopy;;   BIOPSY  12/20/2021   Procedure: BIOPSY;  Surgeon: Malissa Hippo, MD;  [facility];  Service: Endoscopy;;   CATARACT EXTRACTION W/PHACO Right 01/15/2020   Procedure: CATARACT EXTRACTION PHACO AND INTRAOCULAR LENS PLACEMENT (IOC);  Surgeon: Fabio Pierce, MD;  Location: [facility];  Service: Ophthalmology;  Laterality: Right;  CDE: 7.89   CATARACT EXTRACTION W/PHACO Left 01/29/2020   Procedure: CATARACT EXTRACTION PHACO AND INTRAOCULAR LENS PLACEMENT (IOC) (CDE: 6.33);  Surgeon: Fabio Pierce, MD;  Location: [facility];  Service: Ophthalmology;  Laterality: Left;   CHOLECYSTECTOMY  2007   COLON SURGERY  2008   Done at [facility]   COLONOSCOPY     Done at [facility]   ESOPHAGOGASTRODUODENOSCOPY N/A 08/21/2018   Procedure: ESOPHAGOGASTRODUODENOSCOPY (EGD);  Surgeon: Malissa Hippo, MD;  [facility];  Service: Endoscopy;  Laterality: N/A;   ESOPHAGOGASTRODUODENOSCOPY (EGD) WITH PROPOFOL N/A 12/16/2019   Procedure: ESOPHAGOGASTRODUODENOSCOPY (EGD) WITH PROPOFOL;  Surgeon: Malissa Hippo, MD;  [facility];  Service: Endoscopy;  Laterality: N/A;   ESOPHAGOGASTRODUODENOSCOPY (EGD) WITH PROPOFOL N/A 12/20/2021   Procedure:  ESOPHAGOGASTRODUODENOSCOPY (EGD) WITH PROPOFOL;  Surgeon: Malissa Hippo, MD;  [facility];  Service: Endoscopy;  Laterality: N/A;  9:05   ESOPHAGOGASTRODUODENOSCOPY (EGD) WITH PROPOFOL N/A 02/14/2022   Procedure: ESOPHAGOGASTRODUODENOSCOPY (EGD) WITH PROPOFOL;  Surgeon: Malissa Hippo, MD;  [facility];  Service: Endoscopy;  Laterality: N/A;  730   ESOPHAGOGASTRODUODENOSCOPY (EGD) WITH PROPOFOL N/A 06/04/2022   Procedure: ESOPHAGOGASTRODUODENOSCOPY (EGD) WITH PROPOFOL;  Surgeon: Dolores Frame, MD;  [facility];  Service: Gastroenterology;  Laterality: N/A;  145   ESOPHAGOGASTRODUODENOSCOPY (EGD) WITH PROPOFOL N/A 02/26/2023   Procedure: ESOPHAGOGASTRODUODENOSCOPY (EGD) WITH PROPOFOL;  Surgeon: Dolores Frame, MD;  [facility];  Service: Gastroenterology;  Laterality: N/A;  9:15AM;ASA 3   EYE SURGERY     lasix   FLEXIBLE SIGMOIDOSCOPY N/A 10/20/2015   Procedure: FLEXIBLE SIGMOIDOSCOPY;  Surgeon: Malissa Hippo, MD;  [facility];  Service: Endoscopy;  Laterality: N/A;  55 - Dr Karilyn Cota has meeting until 1:00   FLEXIBLE SIGMOIDOSCOPY N/A 07/11/2016   Procedure: FLEXIBLE SIGMOIDOSCOPY;  Surgeon: Malissa Hippo, MD;  [facility];  Service: Endoscopy;  Laterality: N/A;  1200   FLEXIBLE SIGMOIDOSCOPY N/A 08/09/2017   Procedure: FLEXIBLE SIGMOIDOSCOPY;  Surgeon: Malissa Hippo, MD;  [facility];  Service: Endoscopy;  Laterality: N/A;  1:00   FLEXIBLE SIGMOIDOSCOPY N/A 08/21/2018   Procedure: FLEXIBLE SIGMOIDOSCOPY;  Surgeon: Malissa Hippo, MD;  [facility];  Service: Endoscopy;  Laterality: N/A;   FLEXIBLE SIGMOIDOSCOPY N/A 12/16/2019   Procedure: FLEXIBLE SIGMOIDOSCOPY wirh Propofol;  Surgeon: Malissa Hippo, MD;  [facility];  Service: Endoscopy;  Laterality: N/A;  7:30   FLEXIBLE SIGMOIDOSCOPY N/A 02/15/2021   Procedure: FLEXIBLE SIGMOIDOSCOPY WITH PROPOFOL;   Surgeon: Malissa Hippo, MD;  [facility];  Service: Endoscopy;  Laterality: N/A;  am   FLEXIBLE SIGMOIDOSCOPY N/A 12/20/2021   Procedure: FLEXIBLE SIGMOIDOSCOPY;  Surgeon: Malissa Hippo, MD;  [facility];  Service: Endoscopy;  Laterality: N/A;   FLEXIBLE SIGMOIDOSCOPY N/A 02/26/2023   Procedure: FLEXIBLE SIGMOIDOSCOPY;  Surgeon: Dolores Frame, MD;  [facility];  Service: Gastroenterology;  Laterality: N/A;  9:15AM; ASA 3   FRACTURE SURGERY     right wrist metal plate   GI RADIOFREQUENCY ABLATION N/A 02/14/2022   Procedure: GI RADIOFREQUENCY ABLATION;  Surgeon: Malissa Hippo, MD;  [facility];  Service: Endoscopy;  Laterality: N/A;   HOT HEMOSTASIS N/A 12/20/2021   Procedure: HOT HEMOSTASIS (ARGON PLASMA COAGULATION/BICAP);  Surgeon: Malissa Hippo, MD;  [facility];  Service: Endoscopy;  Laterality: N/A;   HOT HEMOSTASIS  06/04/2022   Procedure: HOT HEMOSTASIS (ARGON PLASMA COAGULATION/BICAP);  Surgeon: Marguerita Merles, Reuel Boom, MD;  [facility];  Service: Gastroenterology;;   HOT HEMOSTASIS  02/26/2023   Procedure: HOT HEMOSTASIS (ARGON PLASMA COAGULATION/BICAP);  Surgeon: Marguerita Merles, Reuel Boom, MD;  [facility];  Service: Gastroenterology;;   LIVER TRANSPLANT  11/19/2013   POLYPECTOMY  08/09/2017   Procedure: POLYPECTOMY;  Surgeon: Malissa Hippo, MD;  [facility];  Service: Endoscopy;;  colon small bowel   POLYPECTOMY N/A 02/14/2022   Procedure: POLYPECTOMY;  Surgeon: Malissa Hippo, MD;  [facility];  Service: Endoscopy;  Laterality: N/A;   REVERSE SHOULDER ARTHROPLASTY Left 07/17/2018   Procedure: LEFT REVERSE SHOULDER ARTHROPLASTY;  Surgeon: Francena Hanly, MD;  Location: [facility];  Service: Orthopedics;  Laterality: Left;    REVERSE SHOULDER ARTHROPLASTY Right 07/20/2021   Procedure: REVERSE SHOULDER ARTHROPLASTY;  Surgeon: Francena Hanly, MD;  Location: [facility];  Service: Orthopedics;  Laterality: Right;   SHOULDER CLOSED REDUCTION Left 09/27/2019   Procedure: CLOSED REDUCTION SHOULDER;  Surgeon: Durene Romans, MD;  Location: [facility];  Service: Orthopedics;  Laterality: Left;   SPLENECTOMY  2006   TOTAL SHOULDER REVISION Left 11/12/2019   Procedure: Revision Left Reverse Shoulder Arthroplasty with poly exchange SDD;  Surgeon: Francena Hanly, MD;  Location: [facility];  Service: Orthopedics;  Laterality: Left;  -SDDC   TYMPANOSTOMY TUBE PLACEMENT     UPPER GASTROINTESTINAL ENDOSCOPY     Done at [facility]    Social History: Social History   Socioeconomic History   Marital status: Married    Spouse name: Johnny   Number of children: 1   Years of education: 12   Highest education level: High school graduate  Occupational History   Occupation: Disability    Employer: [employer]    Comment: Immunologist  Tobacco Use   Smoking status: Never   Smokeless tobacco: Never  Vaping Use   Vaping Use: Never used  Substance and Sexual Activity   Alcohol use: No    Alcohol/week: 0.0 standard drinks of alcohol   Drug use: No   Sexual activity: Yes    Birth control/protection: None  Other Topics Concern   Not on file  Social History Narrative   Patient lives in a two story home with her husband. She has an adult son. She is retired from being an Environmental health practitioner for 30 years.    Social Determinants of Health   Financial Resource Strain: Low Risk  (07/02/2022)   Overall Financial Resource Strain (CARDIA)    Difficulty of Paying Living Expenses: Not hard at all  Food Insecurity: No Food Insecurity (07/02/2022)   Hunger Vital Sign    Worried About Running Out of Food in the Last Year: Never true    Ran Out of Food in the Last Year: Never true  Transportation Needs: No Transportation Needs (07/02/2022)   PRAPARE - Administrator, Civil Service (Medical): No    Lack of Transportation (Non-Medical): No  Physical  Activity: Insufficiently Active (07/02/2022)   Exercise Vital Sign    Days of Exercise per Week: 7 days    Minutes of Exercise per Session: 10 min  Stress: No Stress Concern Present (07/02/2022)   [employer] of Occupational Health - Occupational Stress Questionnaire    Feeling of Stress : Not at all  Social Connections: Socially Integrated (07/02/2022)   Social Connection and Isolation Panel [NHANES]    Frequency of Communication with Friends and Family: More than three times a week    Frequency of Social Gatherings with Friends and Family: More than three times a week    Attends Religious Services: More than 4 times per year    Active Member of [employer] or Organizations: Yes    Attends Engineer, structural: More than 4 times per year    Marital Status: Married  Catering manager Violence: Not At Risk (07/02/2022)   Humiliation, Afraid, Rape, and Kick questionnaire    Fear of Current or Ex-Partner: No    Emotionally Abused: No    Physically Abused: No    Sexually Abused: No    Family History: Family History  Problem Relation Age of Onset   Prostate cancer Father    Colon cancer Father  Colon cancer Sister    Lung cancer Sister    Healthy Son    Alcohol abuse Brother    Allergic rhinitis Neg Hx    Asthma Neg Hx    Eczema Neg Hx    Urticaria Neg Hx     Review of Systems: Denies any recent fevers, chills or changes in her health  Physical Exam: Vital Signs BP 127/78 (BP Location: Left Arm, Patient Position: Sitting, Cuff Size: Large)   Pulse 68   Ht 5\' 4"  (1.626 m)   Wt 138 lb 12.8 oz (63 kg)   SpO2 97%   BMI 23.82 kg/m   Physical Exam  Constitutional:      General: Not in acute distress.    Appearance: Normal appearance. Not ill-appearing.  HENT:     Head: Normocephalic and atraumatic.  Eyes:     Pupils: Pupils are equal, round Neck:     Musculoskeletal: Normal range of motion.  Cardiovascular:     Rate and Rhythm: Normal rate Pulmonary:      Effort: Pulmonary effort is normal. No respiratory distress.  Musculoskeletal: Normal range of motion.  Skin:    General: Skin is warm and dry.     Findings: No erythema or rash.  Neurological:     Mental Status: Alert and oriented to person, place, and time. Mental status is at baseline.  Psychiatric:        Mood and Affect: Mood normal.        Behavior: Behavior normal.    Assessment/Plan: The patient is scheduled for excision of scalp wound with placement of myriad or ACell with Dr. Ulice Bold.  Risks, benefits, and alternatives of procedure discussed, questions answered and consent obtained.    Will obtain PCP clearance and clearance from patient's transplant hepatologist at [facility].  Smoking Status: Non-smoker; Counseling Given?  N/A  Caprini Score: 6; Risk Factors include: Age, history of malignancy, and length of planned surgery. Recommendation for mechanical prophylaxis. Encourage early ambulation.   Pictures obtained: @consult   Post-op Rx sent to pharmacy:  Norco, Zofran, Keflex  I instructed the patient to hold her Xanax, gabapentin and losartan the morning of surgery.  I instructed to hold any vitamins, multivitamins, supplements at least 1 week prior to surgery.  I discussed with the patient that her sirolimus medication may put her at increased risk of developing wound healing issues.  I discussed with her that we will see if we can get clearance from her provider at [facility] to hold this medication before and after surgery.  Patient expressed understanding and was in agreement with this.  Patient was provided with the General Surgical Risk consent document and Pain Medication Agreement prior to their appointment.  They had adequate time to read through the risk consent documents and Pain Medication Agreement. We also discussed them in person together during this preop appointment. All of their questions were answered to their satisfaction.  Recommended calling if they have any  further questions.  Risk consent form and Pain Medication Agreement to be scanned into patient's chart.  I discussed with the patient that given her history of being on immunosuppressant therapy as well as her history of radiation to her scalp, she may experience delayed wound healing.  Patient expressed understanding.   Electronically signed by: Laurena Spies, PA-C 05/01/2023 4:07 PM

## 2023-05-02 ENCOUNTER — Telehealth: Payer: Self-pay | Admitting: *Deleted

## 2023-05-02 NOTE — Telephone Encounter (Signed)
Request for surgical clearance and for pt to hold sirolimus for 2 weeks before surgery and for 1 month after surgery faxed to Dr. Johnnye Sima [phone] with confirmation received

## 2023-05-03 ENCOUNTER — Telehealth: Payer: Self-pay | Admitting: Plastic Surgery

## 2023-05-03 ENCOUNTER — Encounter (HOSPITAL_BASED_OUTPATIENT_CLINIC_OR_DEPARTMENT_OTHER): Payer: Self-pay | Admitting: Plastic Surgery

## 2023-05-03 ENCOUNTER — Other Ambulatory Visit: Payer: Self-pay

## 2023-05-03 NOTE — Telephone Encounter (Signed)
Pt. Left vm requesting to talk to Dr. Ulice Bold or a PA.  She would like to know what medications to stop taking before her Sx on 05/13/23.  Please call her at [phone]

## 2023-05-07 NOTE — Telephone Encounter (Signed)
I spoke with the patient.  She was calling in regards to her sirolimus.  I discussed with the patient that we have not yet received clearance to hold that medication, so she should continue as directed to take it until clearance is received.  Patient expressed understanding.

## 2023-05-07 NOTE — Telephone Encounter (Signed)
I called Dr. Eric Form office and spoke with Gaylyn Lambert, RN. She reports pt is being seen by Maricela Bo, NP and request to hold sirolimus should be faxed to her ATTN at [phone]. She also states she will message her about the clearance request. Fax sent with receipt pending

## 2023-05-07 NOTE — Telephone Encounter (Signed)
I called Jannifer Rodney, FNP's office. Confirmed they have received the request for surgical clearance. Staff reports it has been completed and they will fax it back to [place]

## 2023-05-07 NOTE — Telephone Encounter (Signed)
Surgical clearance received. Copy forwarded to Caroline More, PA-C and one sent to batch scan

## 2023-05-08 ENCOUNTER — Encounter: Payer: Self-pay | Admitting: Student

## 2023-05-08 NOTE — Telephone Encounter (Signed)
Call received from [facility], RN. She requested clearance form to be faxed to [phone] and asked for contact person for their provider to call regarding request to hold sirolimus. Contact number for Leander Rams provided.

## 2023-05-08 NOTE — H&P (View-Only) (Signed)
Surgical Clearance has been received from patient's PCP, Jannifer Rodney FNP,  for patient's upcoming surgery with Dr. Ulice Bold .

## 2023-05-08 NOTE — Progress Notes (Signed)
Surgical Clearance has been received from patient's PCP, Christy Hawks FNP,  for patient's upcoming surgery with Dr. Dillingham .    

## 2023-05-09 ENCOUNTER — Encounter: Payer: Self-pay | Admitting: Student

## 2023-05-09 DIAGNOSIS — D849 Immunodeficiency, unspecified: Secondary | ICD-10-CM | POA: Diagnosis not present

## 2023-05-09 DIAGNOSIS — Z944 Liver transplant status: Secondary | ICD-10-CM | POA: Diagnosis not present

## 2023-05-09 NOTE — H&P (View-Only) (Signed)
I spoke with patient's transplant hepatologist at [facility], Dr. April Wall, in regards to patient's sirolimus.  We discussed what was entailed in patient's upcoming surgery.  Dr. Daleen Squibb will plan to switch patient from sirolimus to Prograf (tacrolimus) which she states tends to not have as many adverse wound healing effects.  She plans to keep the patient on the tacrolimus for about a month after surgery and then patient will be switched back to sirolimus.  Dr. Daleen Squibb states that prescription for Prograf has already been prescribed and patient will be switching over to the Prograf in the next few days.

## 2023-05-09 NOTE — Progress Notes (Signed)
I spoke with patient's transplant hepatologist at [facility], Dr. April Wall, in regards to patient's sirolimus.  We discussed what was entailed in patient's upcoming surgery.  Dr. Wall will plan to switch patient from sirolimus to Prograf (tacrolimus) which she states tends to not have as many adverse wound healing effects.  She plans to keep the patient on the tacrolimus for about a month after surgery and then patient will be switched back to sirolimus.  Dr. Wall states that prescription for Prograf has already been prescribed and patient will be switching over to the Prograf in the next few days. 

## 2023-05-13 ENCOUNTER — Other Ambulatory Visit: Payer: Self-pay

## 2023-05-13 ENCOUNTER — Ambulatory Visit (HOSPITAL_BASED_OUTPATIENT_CLINIC_OR_DEPARTMENT_OTHER): Payer: Medicare Other | Admitting: Anesthesiology

## 2023-05-13 ENCOUNTER — Ambulatory Visit (HOSPITAL_BASED_OUTPATIENT_CLINIC_OR_DEPARTMENT_OTHER)
Admission: RE | Admit: 2023-05-13 | Discharge: 2023-05-13 | Disposition: A | Discharge status: Home / Self Care | Payer: Medicare Other | Attending: Plastic Surgery | Admitting: Plastic Surgery

## 2023-05-13 ENCOUNTER — Encounter (HOSPITAL_BASED_OUTPATIENT_CLINIC_OR_DEPARTMENT_OTHER)
Admission: RE | Disposition: A | Discharge status: Home / Self Care | Payer: Self-pay | Source: Home / Self Care | Attending: Plastic Surgery

## 2023-05-13 ENCOUNTER — Encounter (HOSPITAL_BASED_OUTPATIENT_CLINIC_OR_DEPARTMENT_OTHER): Payer: Self-pay | Admitting: Plastic Surgery

## 2023-05-13 DIAGNOSIS — Z08 Encounter for follow-up examination after completed treatment for malignant neoplasm: Secondary | ICD-10-CM | POA: Insufficient documentation

## 2023-05-13 DIAGNOSIS — S0100XA Unspecified open wound of scalp, initial encounter: Secondary | ICD-10-CM | POA: Diagnosis not present

## 2023-05-13 DIAGNOSIS — Z85828 Personal history of other malignant neoplasm of skin: Secondary | ICD-10-CM | POA: Diagnosis not present

## 2023-05-13 DIAGNOSIS — I10 Essential (primary) hypertension: Secondary | ICD-10-CM | POA: Insufficient documentation

## 2023-05-13 DIAGNOSIS — D509 Iron deficiency anemia, unspecified: Secondary | ICD-10-CM | POA: Diagnosis not present

## 2023-05-13 DIAGNOSIS — T8189XA Other complications of procedures, not elsewhere classified, initial encounter: Secondary | ICD-10-CM | POA: Diagnosis present

## 2023-05-13 DIAGNOSIS — K219 Gastro-esophageal reflux disease without esophagitis: Secondary | ICD-10-CM | POA: Insufficient documentation

## 2023-05-13 DIAGNOSIS — Z85038 Personal history of other malignant neoplasm of large intestine: Secondary | ICD-10-CM | POA: Insufficient documentation

## 2023-05-13 DIAGNOSIS — I739 Peripheral vascular disease, unspecified: Secondary | ICD-10-CM

## 2023-05-13 DIAGNOSIS — X58XXXA Exposure to other specified factors, initial encounter: Secondary | ICD-10-CM | POA: Diagnosis not present

## 2023-05-13 DIAGNOSIS — Z923 Personal history of irradiation: Secondary | ICD-10-CM | POA: Diagnosis not present

## 2023-05-13 DIAGNOSIS — K7460 Unspecified cirrhosis of liver: Secondary | ICD-10-CM | POA: Diagnosis not present

## 2023-05-13 DIAGNOSIS — Z944 Liver transplant status: Secondary | ICD-10-CM | POA: Insufficient documentation

## 2023-05-13 DIAGNOSIS — E039 Hypothyroidism, unspecified: Secondary | ICD-10-CM

## 2023-05-13 DIAGNOSIS — Z79623 Long term (current) use of mammalian target of rapamycin (mtor) inhibitor: Secondary | ICD-10-CM | POA: Diagnosis not present

## 2023-05-13 DIAGNOSIS — Z01818 Encounter for other preprocedural examination: Secondary | ICD-10-CM

## 2023-05-13 HISTORY — PX: IRRIGATION AND DEBRIDEMENT OF WOUND WITH SPLIT THICKNESS SKIN GRAFT: SHX5879

## 2023-05-13 SURGERY — IRRIGATION AND DEBRIDEMENT OF WOUND WITH SPLIT THICKNESS SKIN GRAFT
Anesthesia: General | Site: Scalp

## 2023-05-13 MED ORDER — EPHEDRINE SULFATE (PRESSORS) 50 MG/ML IJ SOLN
INTRAMUSCULAR | Status: DC | PRN
  Administered 2023-05-13: 15 mg via INTRAVENOUS

## 2023-05-13 MED ORDER — PROPOFOL 10 MG/ML IV BOLUS
INTRAVENOUS | Status: AC
  Filled 2023-05-13: qty 20

## 2023-05-13 MED ORDER — DEXAMETHASONE SODIUM PHOSPHATE 10 MG/ML IJ SOLN
INTRAMUSCULAR | Status: AC
  Filled 2023-05-13: qty 1

## 2023-05-13 MED ORDER — ACETAMINOPHEN 325 MG RE SUPP: 650.0000 mg | RECTAL | Status: DC | PRN

## 2023-05-13 MED ORDER — LIDOCAINE HCL (CARDIAC) PF 100 MG/5ML IV SOSY
PREFILLED_SYRINGE | INTRAVENOUS | Status: DC | PRN
  Administered 2023-05-13: 80 mg via INTRAVENOUS

## 2023-05-13 MED ORDER — SODIUM CHLORIDE 0.9% FLUSH: 3.0000 mL | Freq: Two times a day (BID) | INTRAVENOUS | Status: DC

## 2023-05-13 MED ORDER — ONDANSETRON HCL 4 MG/2ML IJ SOLN
INTRAMUSCULAR | Status: AC
  Filled 2023-05-13: qty 2

## 2023-05-13 MED ORDER — FENTANYL CITRATE (PF) 100 MCG/2ML IJ SOLN: INTRAMUSCULAR | Status: DC | PRN

## 2023-05-13 MED ORDER — FENTANYL CITRATE (PF) 100 MCG/2ML IJ SOLN
INTRAMUSCULAR | Status: DC | PRN
  Administered 2023-05-13 (×2): 50 ug via INTRAVENOUS

## 2023-05-13 MED ORDER — CEFAZOLIN SODIUM-DEXTROSE 2-4 GM/100ML-% IV SOLN
INTRAVENOUS | Status: AC
  Filled 2023-05-13: qty 100

## 2023-05-13 MED ORDER — ONDANSETRON HCL 4 MG/2ML IJ SOLN
INTRAMUSCULAR | Status: DC | PRN
  Administered 2023-05-13: 4 mg via INTRAVENOUS

## 2023-05-13 MED ORDER — LIDOCAINE HCL (CARDIAC) PF 100 MG/5ML IV SOSY: PREFILLED_SYRINGE | INTRAVENOUS | Status: DC | PRN

## 2023-05-13 MED ORDER — ROCURONIUM BROMIDE 10 MG/ML (PF) SYRINGE
PREFILLED_SYRINGE | INTRAVENOUS | Status: AC
  Filled 2023-05-13: qty 10

## 2023-05-13 MED ORDER — CEFAZOLIN SODIUM-DEXTROSE 2-3 GM-%(50ML) IV SOLR
INTRAVENOUS | Status: DC | PRN
  Administered 2023-05-13: 2 g via INTRAVENOUS

## 2023-05-13 MED ORDER — CEFAZOLIN SODIUM-DEXTROSE 2-3 GM-%(50ML) IV SOLR: INTRAVENOUS | Status: DC | PRN

## 2023-05-13 MED ORDER — FENTANYL CITRATE (PF) 100 MCG/2ML IJ SOLN: 25.0000 ug | INTRAMUSCULAR | Status: DC | PRN

## 2023-05-13 MED ORDER — FENTANYL CITRATE (PF) 100 MCG/2ML IJ SOLN
INTRAMUSCULAR | Status: AC
  Filled 2023-05-13: qty 2

## 2023-05-13 MED ORDER — CEFAZOLIN SODIUM-DEXTROSE 2-4 GM/100ML-% IV SOLN: 2.0000 g | INTRAVENOUS | Status: DC

## 2023-05-13 MED ORDER — OXYCODONE HCL 5 MG PO TABS: 5.0000 mg | ORAL_TABLET | ORAL | Status: DC | PRN

## 2023-05-13 MED ORDER — PROPOFOL 10 MG/ML IV BOLUS
INTRAVENOUS | Status: DC | PRN
  Administered 2023-05-13: 120 mg via INTRAVENOUS

## 2023-05-13 MED ORDER — SODIUM CHLORIDE 0.9% FLUSH: 3.0000 mL | INTRAVENOUS | Status: DC | PRN

## 2023-05-13 MED ORDER — SODIUM CHLORIDE 0.9 % IV SOLN: 250.0000 mL | INTRAVENOUS | Status: DC | PRN

## 2023-05-13 MED ORDER — LACTATED RINGERS IV SOLN: INTRAVENOUS | Status: DC | PRN

## 2023-05-13 MED ORDER — DEXAMETHASONE SODIUM PHOSPHATE 10 MG/ML IJ SOLN: INTRAMUSCULAR | Status: DC | PRN

## 2023-05-13 MED ORDER — DEXAMETHASONE SODIUM PHOSPHATE 10 MG/ML IJ SOLN
INTRAMUSCULAR | Status: DC | PRN
  Administered 2023-05-13: 10 mg via INTRAVENOUS

## 2023-05-13 MED ORDER — ONDANSETRON HCL 4 MG/2ML IJ SOLN: INTRAMUSCULAR | Status: DC | PRN

## 2023-05-13 MED ORDER — LIDOCAINE-EPINEPHRINE 1 %-1:100000 IJ SOLN
INTRAMUSCULAR | Status: DC | PRN
  Administered 2023-05-13: .5 mL

## 2023-05-13 MED ORDER — VASHE WOUND IRRIGATION OPTIME
TOPICAL | Status: DC | PRN
  Administered 2023-05-13: 34 [oz_av] via TOPICAL

## 2023-05-13 MED ORDER — MIDAZOLAM HCL 2 MG/2ML IJ SOLN: INTRAMUSCULAR | Status: DC | PRN

## 2023-05-13 MED ORDER — LACTATED RINGERS IV SOLN: INTRAVENOUS | Status: DC

## 2023-05-13 MED ORDER — ACETAMINOPHEN 500 MG PO TABS
ORAL_TABLET | ORAL | Status: AC
  Filled 2023-05-13: qty 2

## 2023-05-13 MED ORDER — CHLORHEXIDINE GLUCONATE CLOTH 2 % EX PADS: 6.0000 | MEDICATED_PAD | Freq: Once | CUTANEOUS | Status: DC

## 2023-05-13 MED ORDER — ACETAMINOPHEN 10 MG/ML IV SOLN: 1000.0000 mg | Freq: Once | INTRAVENOUS | Status: DC | PRN

## 2023-05-13 MED ORDER — PROPOFOL 10 MG/ML IV BOLUS: INTRAVENOUS | Status: DC | PRN

## 2023-05-13 MED ORDER — ACETAMINOPHEN 325 MG PO TABS: 650.0000 mg | ORAL_TABLET | ORAL | Status: DC | PRN

## 2023-05-13 SURGICAL SUPPLY — 89 items
ADH SKN CLS APL DERMABOND .7 (GAUZE/BANDAGES/DRESSINGS)
APL SKNCLS STERI-STRIP NONHPOA (GAUZE/BANDAGES/DRESSINGS)
BAG DECANTER FOR FLEXI CONT (MISCELLANEOUS) IMPLANT
BALL CTTN LRG ABS STRL LF (GAUZE/BANDAGES/DRESSINGS) ×2
BENZOIN TINCTURE PRP APPL 2/3 (GAUZE/BANDAGES/DRESSINGS) IMPLANT
BLADE CLIPPER SURG (BLADE) IMPLANT
BLADE DERMATOME SS (BLADE) IMPLANT
BLADE HEX COATED 2.75 (ELECTRODE) IMPLANT
BLADE SURG 10 STRL SS (BLADE) IMPLANT
BLADE SURG 15 STRL LF DISP TIS (BLADE) ×1 IMPLANT
BLADE SURG 15 STRL SS (BLADE) ×2
BNDG CMPR 5X3 KNIT ELC UNQ LF (GAUZE/BANDAGES/DRESSINGS)
BNDG CMPR 5X4 CHSV STRCH STRL (GAUZE/BANDAGES/DRESSINGS)
BNDG CMPR 5X4 KNIT ELC UNQ LF (GAUZE/BANDAGES/DRESSINGS)
BNDG CMPR 5X62 HK CLSR LF (GAUZE/BANDAGES/DRESSINGS)
BNDG CMPR 6"X 5 YARDS HK CLSR (GAUZE/BANDAGES/DRESSINGS)
BNDG COHESIVE 4X5 TAN STRL LF (GAUZE/BANDAGES/DRESSINGS) IMPLANT
BNDG ELASTIC 3INX 5YD STR LF (GAUZE/BANDAGES/DRESSINGS) IMPLANT
BNDG ELASTIC 4INX 5YD STR LF (GAUZE/BANDAGES/DRESSINGS) IMPLANT
BNDG ELASTIC 6INX 5YD STR LF (GAUZE/BANDAGES/DRESSINGS) IMPLANT
BNDG GAUZE DERMACEA FLUFF 4 (GAUZE/BANDAGES/DRESSINGS) IMPLANT
BNDG GZE DERMACEA 4 6PLY (GAUZE/BANDAGES/DRESSINGS)
BUR OVAL CARBIDE 4.0 (BURR) IMPLANT
CANISTER SUCT 1200ML W/VALVE (MISCELLANEOUS) IMPLANT
COTTONBALL LRG STERILE PKG (GAUZE/BANDAGES/DRESSINGS) ×2 IMPLANT
COVER BACK TABLE 60X90IN (DRAPES) ×1 IMPLANT
COVER MAYO STAND STRL (DRAPES) ×1 IMPLANT
DERMABOND ADVANCED .7 DNX12 (GAUZE/BANDAGES/DRESSINGS) IMPLANT
DERMACARRIERS GRAFT 1 TO 1.5 (DISPOSABLE)
DRAPE EXTREMITY T 121X128X90 (DISPOSABLE) IMPLANT
DRAPE INCISE IOBAN 66X45 STRL (DRAPES) IMPLANT
DRAPE LAPAROTOMY 100X72 PEDS (DRAPES) IMPLANT
DRAPE SURG 17X23 STRL (DRAPES) ×4 IMPLANT
DRAPE U-SHAPE 76X120 STRL (DRAPES) IMPLANT
DRESSING MEPILEX FLEX 4X4 (GAUZE/BANDAGES/DRESSINGS) IMPLANT
DRSG ADAPTIC 3X8 NADH LF (GAUZE/BANDAGES/DRESSINGS) IMPLANT
DRSG EMULSION OIL 3X3 NADH (GAUZE/BANDAGES/DRESSINGS) ×1 IMPLANT
DRSG MEPILEX FLEX 4X4 (GAUZE/BANDAGES/DRESSINGS) ×1
DRSG OPSITE 6X11 MED (GAUZE/BANDAGES/DRESSINGS) IMPLANT
DRSG TEGADERM 4X10 (GAUZE/BANDAGES/DRESSINGS) IMPLANT
ELECT NDL BLADE 2-5/6 (NEEDLE) IMPLANT
ELECT NEEDLE BLADE 2-5/6 (NEEDLE) ×1 IMPLANT
ELECT REM PT RETURN 9FT ADLT (ELECTROSURGICAL) ×1
ELECTRODE REM PT RTRN 9FT ADLT (ELECTROSURGICAL) IMPLANT
GAUZE 4X4 16PLY ~~LOC~~+RFID DBL (SPONGE) IMPLANT
GAUZE PAD ABD 8X10 STRL (GAUZE/BANDAGES/DRESSINGS) IMPLANT
GAUZE SPONGE 2X2 STRL 8-PLY (GAUZE/BANDAGES/DRESSINGS) IMPLANT
GAUZE SPONGE 4X4 12PLY STRL (GAUZE/BANDAGES/DRESSINGS) ×2 IMPLANT
GLOVE BIO SURGEON STRL SZ 6.5 (GLOVE) ×2 IMPLANT
GOWN STRL REUS W/ TWL LRG LVL3 (GOWN DISPOSABLE) ×1 IMPLANT
GOWN STRL REUS W/TWL LRG LVL3 (GOWN DISPOSABLE) ×1
GRAFT DERMACARRIERS 1 TO 1.5 (DISPOSABLE) IMPLANT
GRAFT MYRIAD 3 LAYER 5X5 (Graft) IMPLANT
NDL HYPO 27GX1-1/4 (NEEDLE) ×1 IMPLANT
NEEDLE HYPO 27GX1-1/4 (NEEDLE) ×1 IMPLANT
NS IRRIG 1000ML POUR BTL (IV SOLUTION) ×1 IMPLANT
PACK BASIN DAY SURGERY FS (CUSTOM PROCEDURE TRAY) ×1 IMPLANT
PAD CAST 3X4 CTTN HI CHSV (CAST SUPPLIES) IMPLANT
PAD CAST 4YDX4 CTTN HI CHSV (CAST SUPPLIES) IMPLANT
PADDING CAST ABS COTTON 4X4 ST (CAST SUPPLIES) IMPLANT
PADDING CAST COTTON 3X4 STRL (CAST SUPPLIES)
PADDING CAST COTTON 4X4 STRL (CAST SUPPLIES)
PENCIL SMOKE EVACUATOR (MISCELLANEOUS) IMPLANT
POWDER MYRIAD MORCLLS FINE 500 (Miscellaneous) IMPLANT
PWDR MYRIAD MORCELLS FINE 500 (Miscellaneous) ×1 IMPLANT
SHEET MEDIUM DRAPE 40X70 STRL (DRAPES) IMPLANT
SPIKE FLUID TRANSFER (MISCELLANEOUS) IMPLANT
SPONGE T-LAP 18X18 ~~LOC~~+RFID (SPONGE) ×1 IMPLANT
STAPLER VISISTAT 35W (STAPLE) IMPLANT
STOCKINETTE 4X48 STRL (DRAPES) IMPLANT
STOCKINETTE 6 STRL (DRAPES) IMPLANT
STOCKINETTE IMPERVIOUS LG (DRAPES) IMPLANT
STRIP CLOSURE SKIN 1/2X4 (GAUZE/BANDAGES/DRESSINGS) IMPLANT
SUCTION TUBE FRAZIER 10FR DISP (SUCTIONS) IMPLANT
SURGILUBE 2OZ TUBE FLIPTOP (MISCELLANEOUS) IMPLANT
SUT MON AB 5-0 PS2 18 (SUTURE) IMPLANT
SUT SILK 3 0 SH CR/8 (SUTURE) IMPLANT
SUT SILK 4 0 PS 2 (SUTURE) IMPLANT
SUT VIC AB 3-0 FS2 27 (SUTURE) IMPLANT
SUT VIC AB 4-0 PS2 18 (SUTURE) IMPLANT
SUT VIC AB 5-0 P-3 18X BRD (SUTURE) IMPLANT
SUT VIC AB 5-0 P3 18 (SUTURE) ×1
SYR BULB EAR ULCER 3OZ GRN STR (SYRINGE) ×1 IMPLANT
SYR CONTROL 10ML LL (SYRINGE) ×1 IMPLANT
TOWEL GREEN STERILE FF (TOWEL DISPOSABLE) ×1 IMPLANT
TRAY DSU PREP LF (CUSTOM PROCEDURE TRAY) ×1 IMPLANT
TUBE CONNECTING 20X1/4 (TUBING) IMPLANT
UNDERPAD 30X36 HEAVY ABSORB (UNDERPADS AND DIAPERS) IMPLANT
YANKAUER SUCT BULB TIP NO VENT (SUCTIONS) IMPLANT

## 2023-05-13 NOTE — Discharge Instructions (Addendum)
Wound Care   Guide to Wound Care  Proper wound care may reduce the risk of infection, improve healing rates, and limit scarring.  This is a general guide to help care for and manage wounds treated with product wound matrix.   Dressing Changes The frequency of dressing changes can vary based on which product was applied, the size of the wound, or the amount of wound drainage. Dressing inspections are recommended, at least weekly.   If you have a Wound VAC it will be changed in one week after the first time it is applied.  Then it will be changed once or twice a week.   If you don't have a Wound VAC, then place KY gel on the wound daily and cover with gauze.  Dressing Types Primary Dressing:  Non-adherent dressing goes directly over wounds being treated with the powder or sheet.  Secondary Dressing:  Secures the primary dressing in place and provides extra protection, compression, and absorption.  1. Wash Hands - To help decrease the risk of infection, caregivers should wash their hands for a minimum of 20 seconds and may use medical gloves.   2. Remove the Dressings - Avoid removing product from the wound by carefully removing the applicable dressing(s) at the time points recommended above, or as recommended by the treating physician.  Expected Color and Odor:  It is entirely normal for the wound to have an unpleasant odor and to form a caramel-colored gel as the product absorbs into the wound. It is important to leave this gel on the wound site.  3. Clean the Wound - Use clean water or saline to gently rinse around the wound surface and remove any excess discharge that may be present on the wound. Do not wipe off any of the caramel-colored gel on the wound.   What to look out for:  Large or increased amount of drainage   Surrounding skin has worsening redness or hot to touch   Increased pain in or around the wound   Flu-like symptoms, fatigue, decreased appetite, fever   Hard, crusty wound  surface with black or brown coloring  4. Apply New Dressings - Dressings should cover the entire wound and be suitable for maintaining a moist wound environment.  The non-adherent mesh dressing should be left in place.  New dressing should consist of KY Jelly to keep the wound moist and soft gauze secured with a wrap or tape.   Maintain a Hydrated Wound Area It is important to keep the wound area moist throughout the healing process. If the wound appears to be dry during dressing changes, select a dressing that will hydrate the wound and maintain that ideal moist environment. If you are unsure what to do, ask the treating physician.  Remodeling Process Every patient heals differently, and no two cases are the same. The size and location of the wound, product type and layering configurations, and general patient health all contribute to how quickly a wound will heal.  While many factors can influence the rate at which the product absorbs, the following can be used as a general guide.   THINGS TO DO: Refrain from smoking High protein diet with plenty of vegetables and some fruit  Limit simple processed carbohydrates and sugar Protect the wound from trauma Protect the dressing  powder       Sheet            Sorbact dressing   Post Anesthesia Home Care Instructions  Activity: Get plenty   of rest for the remainder of the day. A responsible individual must stay with you for 24 hours following the procedure.  For the next 24 hours, DO NOT: -Drive a car -Operate machinery -Drink alcoholic beverages -Take any medication unless instructed by your physician -Make any legal decisions or sign important papers.  Meals: Start with liquid foods such as gelatin or soup. Progress to regular foods as tolerated. Avoid greasy, spicy, heavy foods. If nausea and/or vomiting occur, drink only clear liquids until the nausea and/or vomiting subsides. Call your physician if vomiting continues.  Special  Instructions/Symptoms: Your throat may feel dry or sore from the anesthesia or the breathing tube placed in your throat during surgery. If this causes discomfort, gargle with warm salt water. The discomfort should disappear within 24 hours.  If you had a scopolamine patch placed behind your ear for the management of post- operative nausea and/or vomiting:  1. The medication in the patch is effective for 72 hours, after which it should be removed.  Wrap patch in a tissue and discard in the trash. Wash hands thoroughly with soap and water. 2. You may remove the patch earlier than 72 hours if you experience unpleasant side effects which may include dry mouth, dizziness or visual disturbances. 3. Avoid touching the patch. Wash your hands with soap and water after contact with the patch.     

## 2023-05-13 NOTE — Anesthesia Preprocedure Evaluation (Signed)
Anesthesia Evaluation  Patient identified by MRN, date of birth, ID band Patient awake    Reviewed: Allergy & Precautions, NPO status , Patient's Chart, lab work & pertinent test results  Airway Mallampati: II  TM Distance: >3 FB Neck ROM: Full    Dental no notable dental hx.    Pulmonary neg pulmonary ROS   Pulmonary exam normal        Cardiovascular hypertension, Pt. on medications and Pt. on home beta blockers + Peripheral Vascular Disease   Rhythm:Regular Rate:Normal     Neuro/Psych Seizures -,   Anxiety        GI/Hepatic ,GERD  Medicated,,(+) Hepatitis -  Endo/Other  Hypothyroidism    Renal/GU   negative genitourinary   Musculoskeletal negative musculoskeletal ROS (+)    Abdominal Normal abdominal exam  (+)   Peds  Hematology  (+) Blood dyscrasia, anemia Lab Results      Component                Value               Date                      WBC                      6.1                 04/01/2023                HGB                      13.9                04/01/2023                HCT                      44.2                04/01/2023                MCV                      94.2                04/01/2023                PLT                      250                 04/01/2023             Lab Results      Component                Value               Date                      NA                       134 (L)             04/01/2023                K  5.0                 04/01/2023                CO2                      28                  04/01/2023                GLUCOSE                  104 (H)             04/01/2023                BUN                      24 (H)              04/01/2023                CREATININE               0.99                04/01/2023                CALCIUM                  9.4                 04/01/2023                EGFR                     67                   09/25/2022                GFRNONAA                 >60                 04/01/2023              Anesthesia Other Findings   Reproductive/Obstetrics                             Anesthesia Physical Anesthesia Plan  ASA: 3  Anesthesia Plan: General   Post-op Pain Management:    Induction: Intravenous  PONV Risk Score and Plan: 3 and Ondansetron, Dexamethasone, Midazolam and Treatment may vary due to age or medical condition  Airway Management Planned: Mask and LMA  Additional Equipment: None  Intra-op Plan:   Post-operative Plan: Extubation in OR  Informed Consent: I have reviewed the patients History and Physical, chart, labs and discussed the procedure including the risks, benefits and alternatives for the proposed anesthesia with the patient or authorized representative who has indicated his/her understanding and acceptance.     Dental advisory given  Plan Discussed with: CRNA  Anesthesia Plan Comments:        Anesthesia Quick Evaluation

## 2023-05-13 NOTE — Anesthesia Procedure Notes (Signed)
Procedure Name: LMA Insertion Date/Time: 05/13/2023 11:40 AM  Performed by: Karen Kitchens, CRNAPre-anesthesia Checklist: Patient identified, Emergency Drugs available, Suction available and Patient being monitored Patient Re-evaluated:Patient Re-evaluated prior to induction Oxygen Delivery Method: Circle system utilized Preoxygenation: Pre-oxygenation with 100% oxygen Induction Type: IV induction Ventilation: Mask ventilation without difficulty LMA: LMA inserted LMA Size: 3.0 Number of attempts: 1 Airway Equipment and Method: Bite block Placement Confirmation: positive ETCO2, breath sounds checked- equal and bilateral and CO2 detector Tube secured with: Tape Dental Injury: Teeth and Oropharynx as per pre-operative assessment

## 2023-05-13 NOTE — Interval H&P Note (Signed)
History and Physical Interval Note:  05/13/2023 10:59 AM  Cassidy Barber  has presented today for surgery, with the diagnosis of scalp wound.  The various methods of treatment have been discussed with the patient and family. After consideration of risks, benefits and other options for treatment, the patient has consented to  Procedure(s): Excision of scalp wound with Myriad or Acell placement (N/A) APPLICATION OF SKIN SUBSTITUTE (N/A) as a surgical intervention.  The patient's history has been reviewed, patient examined, no change in status, stable for surgery.  I have reviewed the patient's chart and labs.  Questions were answered to the patient's satisfaction.     [name] S Sumire Halbleib   

## 2023-05-13 NOTE — Op Note (Signed)
DATE OF OPERATION: 05/13/2023  LOCATION: [facility]  PREOPERATIVE DIAGNOSIS: Scalp wound after cancer excision and radiation  POSTOPERATIVE DIAGNOSIS: Same  PROCEDURE: Excision of scalp wound skin and soft tissue 2 x 2.5 cm and bone 2 x 2 cm with placement of Myriad powder and sheet 500 mg and 5 x 5 cm.  SURGEON: Foster Simpson, DO  ASSISTANT: Evelena Leyden, PA  EBL: minimal  CONDITION: Stable  COMPLICATIONS: None  INDICATION: The patient, Cassidy Barber, is a 72 y.o. female born on [birth year], is here for treatment of a chronic wound after excision of skin cancer and radiation treatment.   PROCEDURE DETAILS:  The patient was seen prior to surgery and marked.  The IV antibiotics were given. The patient was taken to the operating room and given a general anesthetic. A standard time out was performed and all information was confirmed by those in the room. SCDs were placed.   The scalp was prepped and draped.  Local with epinephrine was injected at the site.  The #10 blade was used to excise the skin and soft tissue around the 2 x 2.5 cm wound.  The osteotome was used to excise the bone at the 2 x 2 cm area and get bleeding.  All of the myriad was applied and secured to the skin with the 5-0 Vicryl. Adaptic was placed and secured to the skin  KY and gauze was applied with a dressing. The patient was allowed to wake up and taken to recovery room in stable condition at the end of the case. The family was notified at the end of the case.   The advanced practice practitioner (APP) assisted throughout the case.  The APP was essential in retraction and counter traction when needed to make the case progress smoothly.  This retraction and assistance made it possible to see the tissue plans for the procedure.  The assistance was needed for blood control, tissue re-approximation and assisted with closure of the incision site.

## 2023-05-13 NOTE — Interval H&P Note (Signed)
History and Physical Interval Note:  05/13/2023 10:59 AM  Cassidy Barber  has presented today for surgery, with the diagnosis of scalp wound.  The various methods of treatment have been discussed with the patient and family. After consideration of risks, benefits and other options for treatment, the patient has consented to  Procedure(s): Excision of scalp wound with Myriad or Acell placement (N/A) APPLICATION OF SKIN SUBSTITUTE (N/A) as a surgical intervention.  The patient's history has been reviewed, patient examined, no change in status, stable for surgery.  I have reviewed the patient's chart and labs.  Questions were answered to the patient's satisfaction.     Alena Bills Lenon Kuennen

## 2023-05-13 NOTE — Interval H&P Note (Signed)
History and Physical Interval Note:  05/13/2023 10:59 AM  Cassidy Barber  has presented today for surgery, with the diagnosis of scalp wound.  The various methods of treatment have been discussed with the patient and family. After consideration of risks, benefits and other options for treatment, the patient has consented to  Procedure(s): Excision of scalp wound with Myriad or Acell placement (N/A) APPLICATION OF SKIN SUBSTITUTE (N/A) as a surgical intervention.  The patient's history has been reviewed, patient examined, no change in status, stable for surgery.  I have reviewed the patient's chart and labs.  Questions were answered to the patient's satisfaction.     [name]   

## 2023-05-13 NOTE — Transfer of Care (Signed)
Immediate Anesthesia Transfer of Care Note  Patient: Cassidy Barber  Procedure(s) Performed: Excision of scalp wound with Myriad or Acell placement (Scalp) APPLICATION OF SKIN SUBSTITUTE (Scalp)  Patient Location: PACU  Anesthesia Type:General  Level of Consciousness: awake, drowsy, and patient cooperative  Airway & Oxygen Therapy: Patient Spontanous Breathing and Patient connected to face mask oxygen  Post-op Assessment: Report given to RN and Post -op Vital signs reviewed and stable  Post vital signs: Reviewed and stable  Last Vitals:  Vitals Value Taken Time  BP    Temp    Pulse 75 05/13/23 1206  Resp    SpO2 100 % 05/13/23 1206  Vitals shown include unvalidated device data.  Last Pain:  Vitals:   05/13/23 0954  TempSrc: Oral  PainSc: 0-No pain      Patients Stated Pain Goal: 3 (05/13/23 0954)  Complications: No notable events documented.

## 2023-05-13 NOTE — Anesthesia Postprocedure Evaluation (Signed)
Anesthesia Post Note  Patient: Cassidy Barber  Procedure(s) Performed: Excision of scalp wound with Myriad or Acell placement (Scalp) APPLICATION OF SKIN SUBSTITUTE (Scalp)     Patient location during evaluation: PACU Anesthesia Type: General Level of consciousness: awake and alert Pain management: pain level controlled Vital Signs Assessment: post-procedure vital signs reviewed and stable Respiratory status: spontaneous breathing, nonlabored ventilation, respiratory function stable and patient connected to nasal cannula oxygen Cardiovascular status: blood pressure returned to baseline and stable Postop Assessment: no apparent nausea or vomiting Anesthetic complications: no   No notable events documented.  Last Vitals:  Vitals:   05/13/23 1223 05/13/23 1307  BP: (!) 141/74 (!) 148/71  Pulse: 80 76  Resp: (!) 22 16  Temp:  (!) 36.3 C  SpO2: 98% 97%    Last Pain:  Vitals:   05/13/23 1307  TempSrc:   PainSc: 0-No pain                 Earl Lites P Kadyn Chovan

## 2023-05-14 ENCOUNTER — Telehealth: Payer: Self-pay

## 2023-05-14 ENCOUNTER — Encounter (HOSPITAL_BASED_OUTPATIENT_CLINIC_OR_DEPARTMENT_OTHER): Payer: Self-pay | Admitting: Plastic Surgery

## 2023-05-14 NOTE — Telephone Encounter (Signed)
Patient verbalized wanting to speak to nurse regarding her dressings.  Patient verbalized she'll explain more with nurse.

## 2023-05-14 NOTE — Telephone Encounter (Signed)
Spoke with patient and discussed wound care instructions.

## 2023-05-21 ENCOUNTER — Ambulatory Visit (INDEPENDENT_AMBULATORY_CARE_PROVIDER_SITE_OTHER): Payer: Medicare Other | Admitting: Plastic Surgery

## 2023-05-21 DIAGNOSIS — Z9889 Other specified postprocedural states: Secondary | ICD-10-CM

## 2023-05-21 DIAGNOSIS — S0103XA Puncture wound without foreign body of scalp, initial encounter: Secondary | ICD-10-CM

## 2023-05-21 DIAGNOSIS — Z944 Liver transplant status: Secondary | ICD-10-CM | POA: Diagnosis not present

## 2023-05-21 DIAGNOSIS — D849 Immunodeficiency, unspecified: Secondary | ICD-10-CM | POA: Diagnosis not present

## 2023-05-21 NOTE — Progress Notes (Signed)
The patient is a 73 year old female here for follow-up after undergoing excision of skin lesion of her scalp July 1.  She had placement of the myriad powder and sheet.  She has been doing dressing changes with KY twice a day.  It is a little bit dry so would like her to go to 3 times a day.  At the next visit we will take the Adaptic off.  Pictures were obtained of the patient and placed in the chart with the patient's or guardian's permission.

## 2023-05-22 ENCOUNTER — Encounter: Payer: Medicare Other | Admitting: Plastic Surgery

## 2023-05-22 ENCOUNTER — Encounter: Payer: Self-pay | Admitting: Family Medicine

## 2023-05-22 ENCOUNTER — Ambulatory Visit (INDEPENDENT_AMBULATORY_CARE_PROVIDER_SITE_OTHER): Payer: Medicare Other | Admitting: Family Medicine

## 2023-05-22 VITALS — BP 116/69 | HR 74 | Temp 98.0°F | Ht 64.0 in | Wt 138.0 lb

## 2023-05-22 DIAGNOSIS — N898 Other specified noninflammatory disorders of vagina: Secondary | ICD-10-CM

## 2023-05-22 DIAGNOSIS — Z1331 Encounter for screening for depression: Secondary | ICD-10-CM | POA: Diagnosis not present

## 2023-05-22 DIAGNOSIS — R300 Dysuria: Secondary | ICD-10-CM

## 2023-05-22 DIAGNOSIS — L304 Erythema intertrigo: Secondary | ICD-10-CM | POA: Diagnosis not present

## 2023-05-22 LAB — URINALYSIS, ROUTINE W REFLEX MICROSCOPIC
Bilirubin, UA: NEGATIVE
Glucose, UA: NEGATIVE
Ketones, UA: NEGATIVE
Leukocytes,UA: NEGATIVE
Nitrite, UA: NEGATIVE
Protein,UA: NEGATIVE
Specific Gravity, UA: <1.005 — ABNORMAL LOW (ref 1.005–1.030)
Urobilinogen, Ur: 0.2 mg/dL (ref 0.2–1.0)
pH, UA: 6 (ref 5.0–7.5)

## 2023-05-22 LAB — MICROSCOPIC EXAMINATION
Bacteria, UA: NONE SEEN
Epithelial Cells (non renal): NONE SEEN /hpf (ref 0–10)
Renal Epithel, UA: NONE SEEN /hpf

## 2023-05-22 LAB — WET PREP FOR TRICH, YEAST, CLUE
Clue Cell Exam: NEGATIVE
Trichomonas Exam: NEGATIVE
Yeast Exam: NEGATIVE

## 2023-05-22 MED ORDER — KETOCONAZOLE 2 % EX CREA: 1.0000 | TOPICAL_CREAM | Freq: Two times a day (BID) | CUTANEOUS | 0 refills | Status: AC

## 2023-05-22 NOTE — Progress Notes (Signed)
Acute Office Visit  Subjective:  Patient ID: Cassidy Barber, female    DOB: [birth year], 72 y.o.   MRN: [id number]  Chief Complaint  Patient presents with   Acute Visit    Patient states he bottom is raw   HPI Patient is in today for pain in her "bottom-end"  States that it is raw. She states that it is itching and burning with urination. Endorses burning all the time in her pubic region. Reports that she took leftover moxicillin, 4 doses but it did not do anything. States she has no energy. States that she gives out really quickly. States that it seems like it is getting better. Denies rash. Endorses more frequent urination. States that she put baby powder and vaseline. She had a liver transplant is and is on prograf.   ROS As per HPI  Objective:  BP 116/69   Pulse 74   Temp 98 F (36.7 C)   Ht 5\' 4"  (1.626 m)   Wt 138 lb (62.6 kg)   SpO2 98%   BMI 23.69 kg/m   Physical Exam Constitutional:      General: She is awake. She is not in acute distress.    Appearance: Normal appearance. She is well-developed and well-groomed. She is not ill-appearing, toxic-appearing or diaphoretic.  Cardiovascular:     Rate and Rhythm: Normal rate.     Pulses: Normal pulses.          Radial pulses are 2+ on the right side and 2+ on the left side.       Posterior tibial pulses are 2+ on the right side and 2+ on the left side.     Heart sounds: Normal heart sounds. No murmur heard.    No gallop.  Pulmonary:     Effort: Pulmonary effort is normal. No respiratory distress.     Breath sounds: Normal breath sounds. No stridor. No wheezing, rhonchi or rales.  Abdominal:     Hernia: There is no hernia in the right inguinal area.  Genitourinary:    Exam position: Lithotomy position.     Pubic Area: No rash.      Tanner stage (genital): 5.     Labia:        Right: Rash present.      Comments: Declined chaperone  Erythematous, raised, confluent rash on bilateral labia and groin folds   Musculoskeletal:     Cervical back: Full passive range of motion without pain and neck supple.     Right lower leg: No edema.     Left lower leg: No edema.  Lymphadenopathy:     Lower Body: No right inguinal adenopathy.  Skin:    General: Skin is warm.     Capillary Refill: Capillary refill takes less than 2 seconds.  Neurological:     General: No focal deficit present.     Mental Status: She is alert, oriented to person, place, and time and easily aroused. Mental status is at baseline.     GCS: GCS eye subscore is 4. GCS verbal subscore is 5. GCS motor subscore is 6.     Motor: No weakness.  Psychiatric:        Attention and Perception: Attention and perception normal.        Mood and Affect: Mood and affect normal.        Speech: Speech normal.        Behavior: Behavior normal. Behavior is cooperative.  Thought Content: Thought content normal. Thought content does not include homicidal or suicidal ideation. Thought content does not include homicidal or suicidal plan.        Cognition and Memory: Cognition and memory normal.        Judgment: Judgment normal.       05/22/2023    9:51 AM 01/10/2023    8:29 AM 09/25/2022    9:06 AM  Depression screen PHQ 2/9  Decreased Interest 1 1 2   Down, Depressed, Hopeless 0 0 0  PHQ - 2 Score 1 1 2   Altered sleeping 2 0 0  Tired, decreased energy 3 2 2   Change in appetite 0 0 0  Feeling bad or failure about yourself  0 0 0  Trouble concentrating 0 0 0  Moving slowly or fidgety/restless 0 0 0  Suicidal thoughts 0 0 0  PHQ-9 Score 6 3 4   Difficult doing work/chores Not difficult at all Somewhat difficult Not difficult at all      05/22/2023    9:53 AM 07/09/2022   11:33 AM 06/08/2022   11:15 AM 07/06/2021   10:56 AM  GAD 7 : Generalized Anxiety Score  Nervous, Anxious, on Edge 0 0 0 0  Control/stop worrying 0 0 0 0  Worry too much - different things 0 0 0 0  Trouble relaxing 1 0 0 0  Restless 1 0 1 0  Easily annoyed or  irritable 0 0 0 0  Afraid - awful might happen 0 0 0 0  Total GAD 7 Score 2 0 1 0  Anxiety Difficulty  Not difficult at all Not difficult at all Not difficult at all   Assessment & Plan:  1. Vaginal itching Labs as below. Will communicate results to patient once available.  - WET PREP FOR TRICH, YEAST, CLUE  2. Dysuria Labs as below. Will communicate results to patient once available.  - Urinalysis, Routine w reflex microscopic - Urine Culture  3. Encounter for screening for depression Pt screened positive for depression today. States that she was not depressed answered questions of low energy. Pt offered nonpharmacologic and pharmacologic therapy. Pt declined initiating treatment at this time. Safety contract established today with patient in clinic. Denies intent to harm herself or others. Pt to notify provider if they would like to initiate treatment.   4. Intertrigo Will start medication as below. Provided patient with written and verbal information on medication and nonpharmacologic treatment of intertrigo.  - ketoconazole (NIZORAL) 2 % cream; Apply 1 Application topically 2 (two) times daily.  Dispense: 15 g; Refill: 0  The above assessment and management plan was discussed with the patient. The patient verbalized understanding of and has agreed to the management plan using shared-decision making. Patient is aware to call the clinic if they develop any new symptoms or if symptoms fail to improve or worsen. Patient is aware when to return to the clinic for a follow-up visit. Patient educated on when it is appropriate to go to the emergency department.   Return if symptoms worsen or fail to improve.  Neale Burly, DNP-FNP [facility] [address] [place], [place] [address] [phone]

## 2023-05-23 ENCOUNTER — Other Ambulatory Visit: Payer: Self-pay | Admitting: Family

## 2023-05-23 DIAGNOSIS — I10 Essential (primary) hypertension: Secondary | ICD-10-CM

## 2023-05-23 NOTE — Telephone Encounter (Signed)
[facility] NTBS in Aug for 6 mos FU RF sent to pharmacy 

## 2023-05-24 LAB — URINE CULTURE

## 2023-05-24 MED ORDER — CEPHALEXIN 500 MG PO CAPS
500.0000 mg | ORAL_CAPSULE | Freq: Two times a day (BID) | ORAL | 0 refills | Status: AC
Start: 2023-05-24 — End: 2023-05-31

## 2023-05-24 NOTE — Telephone Encounter (Signed)
Patient already had appt for 07-12-23 for 6 month

## 2023-05-24 NOTE — Progress Notes (Signed)
Positive for E. Coli. Will send in Keflex for patient to start. Sent to [facility].

## 2023-05-24 NOTE — Addendum Note (Signed)
Addended by: Neale Burly on: 05/24/2023 12:57 PM   Modules accepted: Orders

## 2023-05-25 LAB — URINE CULTURE

## 2023-05-27 NOTE — Progress Notes (Signed)
Keflex sent in for patient. Unsure if she started it. Can complete course of keflex since UTI is susceptible to it.

## 2023-05-28 ENCOUNTER — Encounter: Payer: Medicare Other | Admitting: Physician Assistant

## 2023-05-29 NOTE — Progress Notes (Signed)
Patient is a 72 year old female s/p excision of scalp wound with placement of myriad performed 05/13/2023 by Dr. Ulice Bold who presents to clinic for postoperative follow-up.  She was last seen here in clinic on 05/21/2023.  At that time, the Adaptic dressing change appear to be dry.  Recommended K-Y jelly application 3 times daily and plan to take Adaptic off at subsequent encounter.  Today, patient is doing OK. She states that her husband has been applying KY jelly on top of the bordered Mepilex dressing three times per day.  On exam, Mepilex dressing is removed. Dry gauze is fastened hard to the underlying Adaptic dressing. Removed gently after moistening with saline.  The Adaptic dressing is secured with sutures that remain intact. Appears hardened.  Underlying Myriad appears dark and dessicated. No surrounding erythema or concern for infection. No malodor.  Applied liberal amount of KY jelly directly over the Adaptic followed by moistened gauze and then dry gauze. She will secure the gauze with her hat. Emphasized the importance of removing all the gauze before applying KY jelly directly on top of the Adaptic.  Recommending 3-4 times daily.  Return in 10 days for reevaluation and removal/replacement of the Adaptic.  Discussed with Dr. Ulice Bold who agrees with plan.  Picture(s) obtained of the patient and placed in the chart were with the patient's or guardian's permission.

## 2023-05-30 ENCOUNTER — Encounter: Payer: Self-pay | Admitting: Transplant Hepatology

## 2023-05-31 ENCOUNTER — Ambulatory Visit (INDEPENDENT_AMBULATORY_CARE_PROVIDER_SITE_OTHER): Payer: Medicare Other | Admitting: Physician Assistant

## 2023-05-31 ENCOUNTER — Encounter: Payer: Self-pay | Admitting: Physician Assistant

## 2023-05-31 VITALS — BP 140/74 | HR 68

## 2023-05-31 DIAGNOSIS — Z9889 Other specified postprocedural states: Secondary | ICD-10-CM

## 2023-05-31 DIAGNOSIS — S0100XD Unspecified open wound of scalp, subsequent encounter: Secondary | ICD-10-CM

## 2023-06-01 ENCOUNTER — Other Ambulatory Visit: Payer: Medicare Other

## 2023-06-03 ENCOUNTER — Encounter: Payer: Medicare Other | Admitting: Student

## 2023-06-03 DIAGNOSIS — D849 Immunodeficiency, unspecified: Secondary | ICD-10-CM | POA: Diagnosis not present

## 2023-06-03 DIAGNOSIS — Z944 Liver transplant status: Secondary | ICD-10-CM | POA: Diagnosis not present

## 2023-06-03 NOTE — Progress Notes (Deleted)
Patient is a 72 year old female who underwent excision of scalp wound with placement of myriad with Dr. Ulice Bold on 05/13/2023.  She presents to the clinic for postoperative follow-up.  Patient was last seen in the clinic on 05/31/2023.  At this visit, patient reported she is doing okay.  She stated her husband had been applying K-Y jelly on top of the bordered Mepilex dressing 3 times per day.  On exam, Mepilex dressing was removed.  Dry gauze was fastened hard to the underlying Adaptic dressing.  It was removed gently after moistening with saline.  The Adaptic dressing was secured with sutures and remained intact.  Appeared to have harden.  The underlying myriad appeared to be dark and desiccated.  There is no surrounding erythema or concern for infection.  A liberal amount of K-Y jelly was applied directly over the Adaptic followed by moistened gauze and then dry gauze.  It was recommended she apply the K-Y jelly 3-4 times daily.  Today,

## 2023-06-06 ENCOUNTER — Encounter: Payer: Self-pay | Admitting: Transplant Hepatology

## 2023-06-10 ENCOUNTER — Ambulatory Visit (INDEPENDENT_AMBULATORY_CARE_PROVIDER_SITE_OTHER): Payer: Medicare Other | Admitting: Physician Assistant

## 2023-06-10 VITALS — BP 129/76 | HR 65

## 2023-06-10 DIAGNOSIS — Z9889 Other specified postprocedural states: Secondary | ICD-10-CM

## 2023-06-10 DIAGNOSIS — S0100XD Unspecified open wound of scalp, subsequent encounter: Secondary | ICD-10-CM

## 2023-06-10 NOTE — Progress Notes (Signed)
Patient is a 72 year old female s/p excision of scalp wound with placement of myriad performed 05/13/2023 by Dr. Ulice Bold who presents to clinic for postoperative follow-up.   She was last seen here in clinic on 05/31/2023.  At that time, she tells me that she and her husband had been applying K-Y jelly on top of the bordered Mepilex dressing.  As a result, the underlying myriad beneath Adaptic appeared desiccated and hard.  Discussed liberal amount of K-Y jelly over the Adaptic followed by moistened gauze 3-4 times daily in an effort to help moisten the myriad.  Will have her follow-up in 10 days for reevaluation and likely removal/replacement of the Adaptic which is secured with sutures.  Today, patient is doing well.  She states that her and her husband have been applying K-Y jelly over top of the Adaptic dressing 3-4 times per day followed by moistened gauze, ABD pad, and secured with hat.  She is hopeful that there has been some progress and she feels as though it has softened.  She is looking forward to being able to shampoo her hair again.  On exam, Adaptic dressing to secured with sutures is removed without complication or difficulty.  The underlying myriad she is still incorporating, some darkening around the periphery but improved compared to previous encounter.  Surrounding skin/tissue appears healthy.  No malodor or drainage.  The myriad she is secured with Vicryl sutures.  She is nearly 1 month postop, but will give her an additional week to allow for further incorporation prior to removal of the myriad sheet.  Unclear how much growth has occurred from below the sheet, but the exam today is benign and tissue looks healthy.  Recommend continued Adaptic and K-Y jelly dressing changes.  Picture(s) obtained of the patient and placed in the chart were with the patient's or guardian's permission.

## 2023-06-17 ENCOUNTER — Encounter: Payer: Medicare Other | Admitting: Student

## 2023-06-18 ENCOUNTER — Ambulatory Visit (INDEPENDENT_AMBULATORY_CARE_PROVIDER_SITE_OTHER): Payer: Medicare Other | Admitting: Physician Assistant

## 2023-06-18 VITALS — BP 99/66 | HR 67

## 2023-06-18 DIAGNOSIS — S0100XD Unspecified open wound of scalp, subsequent encounter: Secondary | ICD-10-CM

## 2023-06-18 NOTE — Progress Notes (Signed)
Patient is a 72 year old female s/p excision of scalp wound with placement of myriad performed 05/13/2023 by Dr. Ulice Bold who presents to clinic for postoperative follow-up.   She was last seen here in clinic on 06/10/2023.  At that time, I Adaptec dressing is removed without complication or difficulty.  Underlying myriad is still incorporating, some darkening noted around periphery but improved compared to previous encounter.  Surrounding skin and tissue appeared healthy.  Myriad remained secured with Vicryl sutures, but will remove at subsequent encounter.  Recommended continued Adaptic and K-Y jelly dressing changes.  Today, patient is doing well.  She states that her and her husband have been applying Adaptic and K-Y jelly dressing changes 2-3 times per day, as recommended at previous encounters.  She continues to deny any pain symptoms and is curious to see how it looks underneath the unincorporated myriad sheet.  On exam, the overlying.  Sheet that has not yet incorporated it remains secured with Vicryl sutures is removed without complication or difficulty.  Underneath, there is still a moderate amount of unincorporated myriad.  However, it feels as though it is filling in from underneath and there is evidence of some underlying granulation as seen in the photo at 5 o'clock position.  Surrounding skin appears healthy, no evidence concerning for infection.  Recommending continued Adaptic and K-Y jelly dressing changes.  Return in 3 weeks for reevaluation.  She understands that there will be some slough on her dressings in the meantime from any unincorporated myriad that we will slowly meltaway.  If she has any questions or concerns, she can certainly call the clinic.  She is eager to wash her hair.  Recommending that she use a waterproof bandage over the surgical site and have her hair washed at a salon by her friend who is a stylist and could potentially avoid the exposed area.   Picture(s) obtained  of the patient and placed in the chart were with the patient's or guardian's permission.

## 2023-06-25 DIAGNOSIS — D485 Neoplasm of uncertain behavior of skin: Secondary | ICD-10-CM | POA: Diagnosis not present

## 2023-06-25 DIAGNOSIS — Z1509 Genetic susceptibility to other malignant neoplasm: Secondary | ICD-10-CM | POA: Diagnosis not present

## 2023-06-25 DIAGNOSIS — D0471 Carcinoma in situ of skin of right lower limb, including hip: Secondary | ICD-10-CM | POA: Diagnosis not present

## 2023-06-25 DIAGNOSIS — C44722 Squamous cell carcinoma of skin of right lower limb, including hip: Secondary | ICD-10-CM | POA: Diagnosis not present

## 2023-06-26 DIAGNOSIS — Z944 Liver transplant status: Secondary | ICD-10-CM | POA: Diagnosis not present

## 2023-06-26 DIAGNOSIS — D849 Immunodeficiency, unspecified: Secondary | ICD-10-CM | POA: Diagnosis not present

## 2023-07-01 ENCOUNTER — Ambulatory Visit
Admission: RE | Admit: 2023-07-01 | Discharge: 2023-07-01 | Disposition: A | Discharge status: Home / Self Care | Payer: Medicare Other | Source: Ambulatory Visit | Attending: Transplant Hepatology | Admitting: Transplant Hepatology

## 2023-07-01 DIAGNOSIS — R748 Abnormal levels of other serum enzymes: Secondary | ICD-10-CM

## 2023-07-01 DIAGNOSIS — Z944 Liver transplant status: Secondary | ICD-10-CM

## 2023-07-01 MED ORDER — GADOPICLENOL 0.5 MMOL/ML IV SOLN
7.0000 mL | Freq: Once | INTRAVENOUS | Status: AC | PRN
  Administered 2023-07-01: 7 mL via INTRAVENOUS

## 2023-07-02 DIAGNOSIS — L570 Actinic keratosis: Secondary | ICD-10-CM | POA: Diagnosis not present

## 2023-07-04 ENCOUNTER — Ambulatory Visit (INDEPENDENT_AMBULATORY_CARE_PROVIDER_SITE_OTHER): Payer: Medicare Other

## 2023-07-04 ENCOUNTER — Ambulatory Visit (INDEPENDENT_AMBULATORY_CARE_PROVIDER_SITE_OTHER): Payer: Medicare Other | Admitting: Family Medicine

## 2023-07-04 ENCOUNTER — Encounter: Payer: Self-pay | Admitting: Family Medicine

## 2023-07-04 VITALS — Ht 64.0 in | Wt 140.0 lb

## 2023-07-04 VITALS — BP 103/67 | HR 69 | Temp 98.7°F | Ht 64.0 in | Wt 141.0 lb

## 2023-07-04 DIAGNOSIS — Z0000 Encounter for general adult medical examination without abnormal findings: Secondary | ICD-10-CM | POA: Diagnosis not present

## 2023-07-04 DIAGNOSIS — T8131XA Disruption of external operation (surgical) wound, not elsewhere classified, initial encounter: Secondary | ICD-10-CM

## 2023-07-04 MED ORDER — SULFAMETHOXAZOLE-TRIMETHOPRIM 800-160 MG PO TABS
1.0000 | ORAL_TABLET | Freq: Two times a day (BID) | ORAL | 0 refills | Status: DC
Start: 2023-07-04 — End: 2023-07-18

## 2023-07-04 NOTE — Progress Notes (Signed)
Acute Office Visit  Subjective:  Patient ID: Cassidy Barber, female    DOB: 1951/01/06, 72 y.o.   MRN: [id number]  Chief Complaint  Patient presents with   spot on stomach    With drainage and odor   HPI Patient is in today for incision site drainage. Reports that she had a liver transplant almost 10 years ago. Since then she has always seen what she believed was a stitch at that site. She had it evaluated at that time and was told it was okay. Approximately 6 months ago she noticed the site was draining yellow fluid and blood onto her shirts. She has started having to wear a bandage at the site daily. She reached out to her surgeon at [facility] and they recommended she receive a wound culture from PCP or dermatology. She has a history of multiple abdominal surgeries for colon resection, hernia repair, and liver transplant. Denies fever. Endorses fatigue and trouble sleeping. She has been putting Vaseline on the site. Has been avoiding pools and hot tubs this summer.   ROS As per HPI   Objective:  BP 103/67   Pulse 69   Temp 98.7 F (37.1 C)   Ht 5\' 4"  (1.626 m)   Wt 141 lb (64 kg)   SpO2 98%   BMI 24.20 kg/m   Physical Exam Constitutional:      General: She is awake. She is not in acute distress.    Appearance: Normal appearance. She is well-developed and well-groomed. She is not ill-appearing, toxic-appearing or diaphoretic.  Cardiovascular:     Rate and Rhythm: Normal rate and regular rhythm.     Pulses: Normal pulses.          Radial pulses are 2+ on the right side and 2+ on the left side.       Posterior tibial pulses are 2+ on the right side and 2+ on the left side.     Heart sounds: Normal heart sounds. No murmur heard.    No gallop.  Pulmonary:     Effort: Pulmonary effort is normal. No respiratory distress.     Breath sounds: Normal breath sounds. No stridor. No wheezing, rhonchi or rales.  Musculoskeletal:     Cervical back: Full passive range of motion without pain  and neck supple.     Right lower leg: No edema.     Left lower leg: No edema.  Skin:    General: Skin is warm.     Capillary Refill: Capillary refill takes less than 2 seconds.     Comments: Open, draining incision site at umbilicus. Mesh-like substance present that it not able to be removed with wiping. Draining sero-sanguinous fluid and white purulent drainage.    Neurological:     General: No focal deficit present.     Mental Status: She is alert, oriented to person, place, and time and easily aroused. Mental status is at baseline.     GCS: GCS eye subscore is 4. GCS verbal subscore is 5. GCS motor subscore is 6.     Motor: No weakness.  Psychiatric:        Attention and Perception: Attention and perception normal.        Mood and Affect: Mood and affect normal.        Speech: Speech normal.        Behavior: Behavior normal. Behavior is cooperative.        Thought Content: Thought content normal. Thought content does not include  homicidal or suicidal ideation. Thought content does not include homicidal or suicidal plan.        Cognition and Memory: Cognition and memory normal.        Judgment: Judgment normal.           07/04/2023   10:00 AM 07/04/2023    8:33 AM 05/22/2023    9:51 AM  Depression screen PHQ 2/9  Decreased Interest 1 0 1  Down, Depressed, Hopeless 0 0 0  PHQ - 2 Score 1 0 1  Altered sleeping 1 0 2  Tired, decreased energy 1 0 3  Change in appetite 0 0 0  Feeling bad or failure about yourself  0 0 0  Trouble concentrating 0 0 0  Moving slowly or fidgety/restless 0 0 0  Suicidal thoughts 0 0 0  PHQ-9 Score 3 0 6  Difficult doing work/chores Somewhat difficult Not difficult at all Not difficult at all      07/04/2023   10:01 AM 05/22/2023    9:53 AM 07/09/2022   11:33 AM 06/08/2022   11:15 AM  GAD 7 : Generalized Anxiety Score  Nervous, Anxious, on Edge 0 0 0 0  Control/stop worrying 0 0 0 0  Worry too much - different things 0 0 0 0  Trouble relaxing 0  1 0 0  Restless 0 1 0 1  Easily annoyed or irritable 0 0 0 0  Afraid - awful might happen 0 0 0 0  Total GAD 7 Score 0 2 0 1  Anxiety Difficulty Not difficult at all  Not difficult at all Not difficult at all   Assessment & Plan:  1. Open abdominal incision with drainage, initial encounter Labs as below. Will communicate results to patient once available. Will await results to determine next steps.  Will start empiric treatment as below with antibiotic as below. Reviewed medication dispense report.  - Anaerobic and Aerobic Culture - sulfamethoxazole-trimethoprim (BACTRIM DS) 800-160 MG tablet; Take 1 tablet by mouth 2 (two) times daily.  Dispense: 14 tablet; Refill: 0  The above assessment and management plan was discussed with the patient. The patient verbalized understanding of and has agreed to the management plan using shared-decision making. Patient is aware to call the clinic if they develop any new symptoms or if symptoms fail to improve or worsen. Patient is aware when to return to the clinic for a follow-up visit. Patient educated on when it is appropriate to go to the emergency department.   Return if symptoms worsen or fail to improve.  Neale Burly, DNP-FNP [facility] [address] [place], [place] [address] [phone]

## 2023-07-04 NOTE — Progress Notes (Signed)
Subjective:   Cassidy Barber is a 72 y.o. female who presents for Medicare Annual (Subsequent) preventive examination.  Visit Complete: Virtual  I connected with  Cassidy Barber on 07/04/23 by a audio enabled telemedicine application and verified that I am speaking with the correct person using two identifiers.  Patient Location: Home  Provider Location: Home Office  I discussed the limitations of evaluation and management by telemedicine. The patient expressed understanding and agreed to proceed.  Patient Medicare AWV questionnaire was completed by the patient on 07/04/2023; I have confirmed that all information answered by patient is correct and no changes since this date.  Review of Systems    Vital Signs: Unable to obtain new vitals due to this being a telehealth visit.        Objective:    Today's Vitals   07/04/23 0830  Weight: 140 lb (63.5 kg)  Height: 5\' 4"  (1.626 m)   Body mass index is 24.03 kg/m.     07/04/2023    8:35 AM 07/04/2023    8:34 AM 05/13/2023    9:53 AM 04/15/2023    2:35 PM 02/26/2023    7:30 AM 02/21/2023   11:32 AM 01/10/2023    9:44 AM  Advanced Directives  Does Patient Have a Medical Advance Directive? Yes Yes No No No No No  Type of Estate agent of [place];Living will Healthcare Power of [place];Living will       Copy of Healthcare Power of Attorney in Chart? No - copy requested No - copy requested       Would patient like information on creating a medical advance directive?   No - Patient declined No - Patient declined No - Patient declined No - Patient declined No - Patient declined    Current Medications (verified) Outpatient Encounter Medications as of 07/04/2023  Medication Sig   acetaminophen (TYLENOL) 500 MG tablet Take 1,000 mg by mouth every 6 (six) hours as needed (for pain.).    alendronate (FOSAMAX) 70 MG tablet TAKE 1 TABLET EVERY WEEK   ALPRAZolam (XANAX) 0.5 MG tablet Take 1 tablet (0.5 mg total) by mouth at  bedtime.   Biotin 1000 MCG tablet Take 1,000 mcg by mouth 3 (three) times daily.   Biotin w/ Vitamins C & E (HAIR SKIN & NAILS GUMMIES PO) Take 2 tablets by mouth daily.   Calcium Carb-Cholecalciferol (CALCIUM 600 + D PO) Take 1 tablet by mouth 2 (two) times daily.   cetirizine (ZYRTEC) 10 MG tablet Take 1 tablet (10 mg total) by mouth daily as needed for allergies.   Cholecalciferol (VITAMIN D) 50 MCG (2000 UT) tablet Take 2,000 Units by mouth daily.   Clobetasol Prop Emollient Base (CLOBETASOL PROPIONATE E) 0.05 % emollient cream Apply 1 application. topically 2 (two) times daily. (Patient taking differently: Apply 1 application  topically 2 (two) times daily as needed (rash).)   clotrimazole-betamethasone (LOTRISONE) cream Apply 1 application topically 2 (two) times daily. (Patient taking differently: Apply 1 application  topically daily as needed (Rash).)   famotidine (PEPCID) 20 MG tablet Take 1 tablet (20 mg total) by mouth 2 (two) times daily.   fluorouracil (EFUDEX) 5 % cream Apply 1 application topically daily as needed (cancer spots).    fluticasone (FLONASE) 50 MCG/ACT nasal spray Place 2 sprays into both nostrils daily. (Patient taking differently: Place 2 sprays into both nostrils daily as needed for allergies.)   gabapentin (NEURONTIN) 100 MG capsule Take 1 capsule (100 mg total)  by mouth daily.   hydrocortisone cream 1 % Apply 1 Application topically 2 (two) times daily as needed for itching.   ketoconazole (NIZORAL) 2 % cream Apply 1 Application topically 2 (two) times daily.   levothyroxine (SYNTHROID) 112 MCG tablet TAKE ONE (1) TABLET BY MOUTH EVERY DAY   losartan (COZAAR) 25 MG tablet Take 1 tablet (25 mg total) by mouth daily.   metoprolol succinate (TOPROL-XL) 25 MG 24 hr tablet TAKE ONE (1) TABLET BY MOUTH EVERY DAY   Multiple Vitamins-Minerals (MULTIVITAMIN WITH MINERALS) tablet Take 1 tablet by mouth daily.   mupirocin ointment (BACTROBAN) 2 % Apply 1 application topically  daily as needed (Cancer spots).   ondansetron (ZOFRAN) 4 MG tablet Take 1 tablet (4 mg total) by mouth every 8 (eight) hours as needed for up to 10 doses for nausea or vomiting.   pantoprazole (PROTONIX) 40 MG tablet TAKE ONE (1) TABLET BY MOUTH TWO (2) TIMES DAILY (Patient taking differently: Take 40 mg by mouth daily.)   Probiotic Product (PROBIOTIC PO) Take 1 capsule by mouth daily.   silver sulfADIAZINE (SILVADENE) 1 % cream Apply 1 Application topically daily. Apply to large surface area once to twice daily (Patient taking differently: Apply 1 Application topically daily as needed (skin irritation). Apply to large surface area once to twice daily)   sirolimus (RAPAMUNE) 1 MG tablet Take 1 mg by mouth 2 (two) times daily.   tacrolimus (PROGRAF) 0.5 MG capsule Take by mouth.   ursodiol (ACTIGALL) 300 MG capsule TAKE 3 CAPSULES BY MOUTH DAILY   vitamin C (ASCORBIC ACID) 250 MG tablet Take 250 mg by mouth daily.   Vitamin D, Ergocalciferol, (DRISDOL) 1.25 MG (50000 UNIT) CAPS capsule TAKE 1 CAPSULE BY MOUTH ONCE A WEEK   vitamin E 180 MG (400 UNITS) capsule Take 400 Units by mouth daily.   No facility-administered encounter medications on file as of 07/04/2023.    Allergies (verified) Ciprofloxacin and Codeine   History: Past Medical History:  Diagnosis Date   Abdominal wall hernia    Incarcerated status post surgical repair 2019 - [facility]   Anemia of chronic disease    Atypical nevus 01/21/2018   atypical neoplasm- Left scalp-ant (txpbx + MOHS), atypical neoplasm- Left scalp post- (txpbx + MOHS)   Basal cell carcinoma    Colon cancer (HCC)    Colon surgery 2005 and 2012   History of pulmonary hypertension    Pre liver transplant   Hypertension    Hypothyroidism    Lynch syndrome    Osteopenia    Primary biliary cirrhosis (HCC)    Status post liver transplantation - follows at [facility]   SCCA (squamous cell carcinoma) of skin 02/23/2020   Right Upper Chest(moderate) (MOH's)   SCCA  (squamous cell carcinoma) of skin 04/07/2020   Left Top Leg (Keratoacanthoma) treatment after biopsy   SCCA (squamous cell carcinoma) of skin 04/07/2020   Left Foot Dorsal (in situ) treatment after biopsy   SCCA (squamous cell carcinoma) of skin 03/27/2021   Right Upper Back (Keratoacanthoma) (excision) (clear)   SCCA (squamous cell carcinoma) of skin 06/13/2021   Right Shoulder - anterior (moderately differentiated) (tx p bx)   SCCA (squamous cell carcinoma) of skin 06/13/2021   Right Thigh - anterior (well differentiated) (tx p bx)   SCCA (squamous cell carcinoma) of skin 06/13/2021   Right Lower Leg - anterior (well differentiated) (tx p bx)   Squamous cell carcinoma of skin 04/22/2018   KA-Right mid chest (txpbx), KA-left  elbow crease (txpbx), insitu-Right mid chest inf. (exc)   Squamous cell carcinoma of skin 05/20/2018   well diff-Left upper shin (txpbx), well diff-Right lower forearm (txpbx), well diff-Right upper shin (txpbx)   Squamous cell carcinoma of skin 06/11/2018   Scc + margin-Right mid chest inferior    Squamous cell carcinoma of skin 06/27/2018   well diff-Left mid thigh(txpbx), well diff-Left inner thigh (txpbx),insitu-Right cheek (txpbx),well diff-right inner heel (txpbx)   Squamous cell carcinoma of skin 09/16/2018   well diff-left shoulder (txpbx), well diff-Right chin (txpbx), well diff-right chest lateral (CX35FU)   Squamous cell carcinoma of skin 10/01/2018   Right outer lower shin (Txpbx)   Squamous cell carcinoma of skin 04/03/2019   well diff-Right center chest (MOHS), in situ-Right ear   Squamous cell carcinoma of skin 08/05/2019   in situ-Left calf (txpbx), in situ-left bicep (txpbx), well diff-Left chest,inf(txpbx), in situ-Right chest inf-(txpbx)   Squamous cell carcinoma of skin 11/19/2019   KA-left top leg (txpbx), modify-Riight forehead-(mohs), in situ-right hand (txpbx), in situ-Right forearm (txpbx), well diff-Right chest (txpbx), well diff-chin  (txpbx)   Squamous cell carcinoma of skin 01/06/2020   KA- Left top leg   Squamous cell carcinoma of skin 05/12/2013   bowens-middle of chest (CX35FU)   Squamous cell carcinoma of skin 05/18/2015   well diff-Left upper arm (CX35FU + Exc),KA-right chest(txpbx), in situ-Left shin (txpbx), well diff-Right cheek (CX35FU), KA-Left post scalp (CX35FU)   Squamous cell carcinoma of skin 08/09/2015   KA-Left post scalp ((MOHS), in situ- mid chest (Txpbx +exc), in situ-Left upper arm inferior (txpbx)   Squamous cell carcinoma of skin 10/13/2015   Left upper arm-clear   Squamous cell carcinoma of skin 03/09/2016   mod diff-mid chest (txpbx+ exc), mod diff-Right chest (txpbx+exc), well diff-right cheek-(txpbx),well diff-Left hand-(txpbx), in situ-Left upper arm (txpbx), well diff-Right cheek -(txpbx), well diff-Right crease arm (txpbx)    Squamous cell carcinoma of skin 05/24/2016   well diff-Right nasal crease-(MOHS)   Squamous cell carcinoma of skin 08/02/2016   KA-Left chest med (txpbx)   Squamous cell carcinoma of skin 08/30/2016   in situ-Left outer zygoma (txpbx)   Squamous cell carcinoma of skin 12/06/2016   well diff-Left chest sup, Left shoudler, insitu- right post scalp   Squamous cell carcinoma of skin 02/14/2017   well diff-Left forearm (EXC),in situ-RIght ant neck   Squamous cell carcinoma of skin 06/14/2017   in situ-Right forearm (txpbx), in situ-Right chest (txpbx), well diff-left chest (txpbx), well diff-anterior neck- (txpbx)   Squamous cell carcinoma of skin 08/07/2017   well diff-Left upper shoulder (txpbx), sup and invasive-Left temple (txpbx), well diff-Right upper shin (txpbx), in situ-Right clavicle (txpbx)   Squamous cell carcinoma of skin 10/17/2017   well diff-ant. neck (MOHS), in situ-Right chest, inf (txpbx)   Squamous cell carcinoma of skin 01/21/2018   well diff- Right chest,ulnar (txpbx), well diff- right upper chest (txpbx), in situ-Right ant. crown (txpbx)    Squamous cell carcinoma of skin 08/02/2020   well diff-left lower leg-inferior (Txpbx)   Squamous cell carcinoma of skin 08/02/2020   well diff-right lower leg-mid (txpbx)   Squamous cell carcinoma of skin 08/02/2020   well diff-left chest upper   Squamous cell carcinoma of skin 08/02/2020   well diff-mid parietal scalp (MOHS)   Squamous cell carcinoma of skin 08/02/2020   well diff-right foot inner(txpbx)   Squamous cell carcinoma of skin 08/02/2020   well diff- left lower leg medial (txpbx)   Squamous cell carcinoma  of skin 08/02/2020   well diff-left lower leg anterior (txpbx)   Squamous cell carcinoma of skin 08/02/2020   well diff-left lower leg medial (txpbx)   Squamous cell carcinoma of skin 08/02/2020   well diff-right forearm-posterior (txpbx)   Past Surgical History:  Procedure Laterality Date   ABDOMINAL HERNIA REPAIR     Patient's states that she has had 8- 9 hernia surgeries   ABDOMINAL HYSTERECTOMY     BIOPSY  02/15/2021   Procedure: BIOPSY;  Surgeon: Malissa Hippo, MD;  [facility];  Service: Endoscopy;;   BIOPSY  12/20/2021   Procedure: BIOPSY;  Surgeon: Malissa Hippo, MD;  [facility];  Service: Endoscopy;;   CATARACT EXTRACTION W/PHACO Right 01/15/2020   Procedure: CATARACT EXTRACTION PHACO AND INTRAOCULAR LENS PLACEMENT (IOC);  Surgeon: Fabio Pierce, MD;  [facility]: [facility];  Service: Ophthalmology;  Laterality: Right;  CDE: 7.89   CATARACT EXTRACTION W/PHACO Left 01/29/2020   Procedure: CATARACT EXTRACTION PHACO AND INTRAOCULAR LENS PLACEMENT (IOC) (CDE: 6.33);  Surgeon: Fabio Pierce, MD;  Location: [facility];  Service: Ophthalmology;  Laterality: Left;   CHOLECYSTECTOMY  2007   COLON SURGERY  2008   [facility] at [facility]   COLONOSCOPY     Done at [facility]   ESOPHAGOGASTRODUODENOSCOPY N/A 08/21/2018   Procedure: ESOPHAGOGASTRODUODENOSCOPY (EGD);  Surgeon: Malissa Hippo, MD;  [facility];  Service: Endoscopy;  Laterality: N/A;    ESOPHAGOGASTRODUODENOSCOPY (EGD) WITH PROPOFOL N/A 12/16/2019   Procedure: ESOPHAGOGASTRODUODENOSCOPY (EGD) WITH PROPOFOL;  Surgeon: Malissa Hippo, MD;  [facility];  Service: Endoscopy;  Laterality: N/A;   ESOPHAGOGASTRODUODENOSCOPY (EGD) WITH PROPOFOL N/A 12/20/2021   Procedure: ESOPHAGOGASTRODUODENOSCOPY (EGD) WITH PROPOFOL;  Surgeon: Malissa Hippo, MD;  [facility];  Service: Endoscopy;  Laterality: N/A;  9:05   ESOPHAGOGASTRODUODENOSCOPY (EGD) WITH PROPOFOL N/A 02/14/2022   Procedure: ESOPHAGOGASTRODUODENOSCOPY (EGD) WITH PROPOFOL;  Surgeon: Malissa Hippo, MD;  [facility];  Service: Endoscopy;  Laterality: N/A;  730   ESOPHAGOGASTRODUODENOSCOPY (EGD) WITH PROPOFOL N/A 06/04/2022   Procedure: ESOPHAGOGASTRODUODENOSCOPY (EGD) WITH PROPOFOL;  Surgeon: Dolores Frame, MD;  [facility];  Service: Gastroenterology;  Laterality: N/A;  145   ESOPHAGOGASTRODUODENOSCOPY (EGD) WITH PROPOFOL N/A 02/26/2023   Procedure: ESOPHAGOGASTRODUODENOSCOPY (EGD) WITH PROPOFOL;  Surgeon: Dolores Frame, MD;  [facility];  Service: Gastroenterology;  Laterality: N/A;  9:15AM;ASA 3   EYE SURGERY     lasix   FLEXIBLE SIGMOIDOSCOPY N/A 10/20/2015   Procedure: FLEXIBLE SIGMOIDOSCOPY;  Surgeon: Malissa Hippo, MD;  [facility];  Service: Endoscopy;  Laterality: N/A;  66 - Dr Karilyn Cota has meeting until 1:00   FLEXIBLE SIGMOIDOSCOPY N/A 07/11/2016   Procedure: FLEXIBLE SIGMOIDOSCOPY;  Surgeon: Malissa Hippo, MD;  [facility];  Service: Endoscopy;  Laterality: N/A;  1200   FLEXIBLE SIGMOIDOSCOPY N/A 08/09/2017   Procedure: FLEXIBLE SIGMOIDOSCOPY;  Surgeon: Malissa Hippo, MD;  [facility];  Service: Endoscopy;  Laterality: N/A;  1:00   FLEXIBLE SIGMOIDOSCOPY N/A 08/21/2018   Procedure: FLEXIBLE SIGMOIDOSCOPY;  Surgeon: Malissa Hippo, MD;  [facility];  Service: Endoscopy;  Laterality:  N/A;   FLEXIBLE SIGMOIDOSCOPY N/A 12/16/2019   Procedure: FLEXIBLE SIGMOIDOSCOPY wirh Propofol;  Surgeon: Malissa Hippo, MD;  [facility];  Service: Endoscopy;  Laterality: N/A;  7:30   FLEXIBLE SIGMOIDOSCOPY N/A 02/15/2021   Procedure: FLEXIBLE SIGMOIDOSCOPY WITH PROPOFOL;  Surgeon: Malissa Hippo,  MD;  [facility];  Service: Endoscopy;  Laterality: N/A;  am   FLEXIBLE SIGMOIDOSCOPY N/A 12/20/2021   Procedure: FLEXIBLE SIGMOIDOSCOPY;  Surgeon: Malissa Hippo, MD;  [facility];  Service: Endoscopy;  Laterality: N/A;   FLEXIBLE SIGMOIDOSCOPY N/A 02/26/2023   Procedure: FLEXIBLE SIGMOIDOSCOPY;  Surgeon: Dolores Frame, MD;  [facility];  Service: Gastroenterology;  Laterality: N/A;  9:15AM; ASA 3   FRACTURE SURGERY     right wrist metal plate   GI RADIOFREQUENCY ABLATION N/A 02/14/2022   Procedure: GI RADIOFREQUENCY ABLATION;  Surgeon: Malissa Hippo, MD;  [facility];  Service: Endoscopy;  Laterality: N/A;   HOT HEMOSTASIS N/A 12/20/2021   Procedure: HOT HEMOSTASIS (ARGON PLASMA COAGULATION/BICAP);  Surgeon: Malissa Hippo, MD;  [facility];  Service: Endoscopy;  Laterality: N/A;   HOT HEMOSTASIS  06/04/2022   Procedure: HOT HEMOSTASIS (ARGON PLASMA COAGULATION/BICAP);  Surgeon: Marguerita Merles, Reuel Boom, MD;  [facility];  Service: Gastroenterology;;   HOT HEMOSTASIS  02/26/2023   Procedure: HOT HEMOSTASIS (ARGON PLASMA COAGULATION/BICAP);  Surgeon: Marguerita Merles, Reuel Boom, MD;  [facility];  Service: Gastroenterology;;   IRRIGATION AND DEBRIDEMENT OF WOUND WITH SPLIT THICKNESS SKIN GRAFT N/A 05/13/2023   Procedure: Excision of scalp wound with Myriad or Acell placement;  Surgeon: Peggye Form, DO;  Location: [facility];  Service: Plastics;  Laterality: N/A;   LIVER TRANSPLANT  11/19/2013   POLYPECTOMY  08/09/2017   Procedure: POLYPECTOMY;  Surgeon: Malissa Hippo, MD;  [facility];  Service: Endoscopy;;  colon small bowel   POLYPECTOMY N/A 02/14/2022   Procedure: POLYPECTOMY;  Surgeon: Malissa Hippo, MD;  [facility];  Service: Endoscopy;  Laterality: N/A;   REVERSE SHOULDER ARTHROPLASTY Left 07/17/2018   Procedure: LEFT REVERSE SHOULDER ARTHROPLASTY;  Surgeon: Francena Hanly, MD;  Location: [facility];  Service: Orthopedics;  Laterality: Left;    REVERSE SHOULDER ARTHROPLASTY Right 07/20/2021   Procedure: REVERSE SHOULDER ARTHROPLASTY;  Surgeon: Francena Hanly, MD;  Location: [facility];  Service: Orthopedics;  Laterality: Right;   SHOULDER CLOSED REDUCTION Left 09/27/2019   Procedure: CLOSED REDUCTION SHOULDER;  Surgeon: Durene Romans, MD;  Location: [facility];  Service: Orthopedics;  Laterality: Left;   SPLENECTOMY  2006   TOTAL SHOULDER REVISION Left 11/12/2019   Procedure: Revision Left Reverse Shoulder Arthroplasty with poly exchange SDD;  Surgeon: Francena Hanly, MD;  Location: [facility];  Service: Orthopedics;  Laterality: Left;  -SDDC   TYMPANOSTOMY TUBE PLACEMENT     UPPER GASTROINTESTINAL ENDOSCOPY     Done at [facility]   Family History  Problem Relation Age of Onset   Prostate cancer Father    Colon cancer Father    Colon cancer Sister    Lung cancer Sister    Healthy Son    Alcohol abuse Brother    Allergic rhinitis Neg Hx    Asthma Neg Hx    Eczema Neg Hx    Urticaria Neg Hx    Social History   Socioeconomic History   Marital status: Married    Spouse name: Johnny   Number of children: 1   Years of education: 12   Highest education level: 12th grade  Occupational History   Occupation: Disability    Employer: [employer]    Comment: Immunologist  Tobacco Use   Smoking status: Never   Smokeless tobacco: Never  Vaping Use  Vaping status: Never Used  Substance and Sexual Activity   Alcohol use: No    Alcohol/week: 0.0 standard drinks of alcohol   Drug use: No   Sexual activity: Yes     Birth control/protection: None  Other Topics Concern   Not on file  Social History Narrative   Patient lives in a two story home with her husband. She has an adult son. She is retired from being an Environmental health practitioner for 30 years.    Social Determinants of Health   Financial Resource Strain: Low Risk  (07/04/2023)   Overall Financial Resource Strain (CARDIA)    Difficulty of Paying Living Expenses: Not hard at all  Food Insecurity: No Food Insecurity (07/04/2023)   Hunger Vital Sign    Worried About Running Out of Food in the Last Year: Never true    Ran Out of Food in the Last Year: Never true  Transportation Needs: No Transportation Needs (07/04/2023)   PRAPARE - Administrator, Civil Service (Medical): No    Lack of Transportation (Non-Medical): No  Physical Activity: Insufficiently Active (07/04/2023)   Exercise Vital Sign    Days of Exercise per Week: 3 days    Minutes of Exercise per Session: 30 min  Stress: No Stress Concern Present (07/04/2023)   [employer] of Occupational Health - Occupational Stress Questionnaire    Feeling of Stress : Not at all  Social Connections: Socially Integrated (07/04/2023)   Social Connection and Isolation Panel [NHANES]    Frequency of Communication with Friends and Family: More than three times a week    Frequency of Social Gatherings with Friends and Family: More than three times a week    Attends Religious Services: More than 4 times per year    Active Member of [employer] or Organizations: Yes    Attends Engineer, structural: More than 4 times per year    Marital Status: Married    Tobacco Counseling Counseling given: Not Answered   Clinical Intake:  Pre-visit preparation completed: Yes  Pain : No/denies pain     Nutritional Risks: None Diabetes: No  How often do you need to have someone help you when you read instructions, pamphlets, or other written materials from your doctor or pharmacy?: 1 -  Never  Interpreter Needed?: No  Information entered by :: Renie Ora, LPN   Activities of Daily Living    06/30/2023    8:45 AM 05/13/2023    9:55 AM  In your present state of health, do you have any difficulty performing the following activities:  Hearing? 0 0  Vision? 0 0  Difficulty concentrating or making decisions? 0 0  Walking or climbing stairs? 0 0  Dressing or bathing? 0 0  Doing errands, shopping? 0   Preparing Food and eating ? N   Using the Toilet? N   In the past six months, have you accidently leaked urine? N   Do you have problems with loss of bowel control? N   Managing your Medications? N   Managing your Finances? N   Housekeeping or managing your Housekeeping? N     Patient Care Team: Junie Spencer, FNP as PCP - General (Family Medicine) Jonelle Sidle, MD as PCP - Cardiology (Cardiology) Arman Bogus, MD as Consulting Physician (Neurosurgery) Glyn Ade, PA-C as Physician Assistant (Dermatology) Janalyn Harder, MD (Inactive) as Consulting Physician (Dermatology) Royetta Car, MD as Referring Physician (Gastroenterology) Malissa Hippo, MD (Inactive) as Consulting  Physician (Gastroenterology) Francena Hanly, MD as Consulting Physician (Orthopedic Surgery)  Indicate any recent Medical Services you may have received from other than [facility] providers in the past year (date may be approximate).     Assessment:   This is a routine wellness examination for [place].  Hearing/Vision screen Vision Screening - Comments:: Wears rx glasses - up to date with routine eye exams with  Dr.Le   Dietary issues and exercise activities discussed:     Goals Addressed             This Visit's Progress    Prevent falls   On track      Depression Screen    07/04/2023    8:33 AM 05/22/2023    9:51 AM 01/10/2023    8:29 AM 09/25/2022    9:06 AM 07/09/2022   11:33 AM 07/02/2022    9:07 AM 06/08/2022   11:11 AM  PHQ 2/9 Scores  PHQ - 2 Score  0 1 1 2 1  0 0  PHQ- 9 Score 0 6 3 4 6 1 1     Fall Risk    07/04/2023    8:30 AM 06/30/2023    8:45 AM 05/22/2023    9:53 AM 01/10/2023    8:27 AM 09/25/2022    9:06 AM  Fall Risk   Falls in the past year? 0 0  0 0  Number falls in past yr: 0      Injury with Fall? 0      Risk for fall due to : No Fall Risks  No Fall Risks    Follow up Falls prevention discussed  Falls evaluation completed      MEDICARE RISK AT HOME: Medicare Risk at Home Any stairs in or around the home?: Yes If so, are there any without handrails?: No Home free of loose throw rugs in walkways, pet beds, electrical cords, etc?: Yes Adequate lighting in your home to reduce risk of falls?: Yes Life alert?: No Use of a cane, walker or w/c?: No Grab bars in the bathroom?: Yes Shower chair or bench in shower?: Yes Elevated toilet seat or a handicapped toilet?: Yes  TIMED UP AND GO:  Was the test performed?  No    Cognitive Function:    12/12/2017   10:29 AM  MMSE - Mini Mental State Exam  Orientation to time 5  Orientation to Place 5  Registration 3  Attention/ Calculation 5  Recall 3  Language- name 2 objects 2  Language- repeat 1  Language- follow 3 step command 3  Language- read & follow direction 1  Write a sentence 1  Copy design 0  Total score 29        07/04/2023    8:35 AM 07/02/2022    9:09 AM 06/29/2021    9:17 AM 06/27/2020    9:32 AM 06/23/2019    8:38 AM  6CIT Screen  What Year? 0 points 0 points 0 points 0 points 0 points  What month? 0 points 0 points 0 points 0 points 0 points  What time? 0 points 0 points 0 points 0 points 0 points  Count back from 20 0 points 0 points 0 points 2 points 0 points  Months in reverse 0 points 0 points 0 points 2 points 0 points  Repeat phrase 0 points 2 points 4 points 0 points 2 points  Total Score 0 points 2 points 4 points 4 points 2 points    Immunizations Immunization History  Administered Date(s) Administered   Fluad Quad(high Dose 65+)  08/21/2019, 08/25/2020, 08/23/2021   HIB (PRP-T) 01/15/2003   Hepatitis A, Adult 06/28/2020, 01/02/2021   Hepatitis B, ADULT 02/28/2004, 03/27/2004, 08/21/2004   Influenza Split 08/06/2011, 09/18/2012, 09/13/2015   Influenza, High Dose Seasonal PF 07/18/2018, 08/05/2019, 01/10/2023   Influenza,inj,Quad PF,6+ Mos 08/07/2010, 07/31/2013, 08/16/2014, 09/01/2015   Influenza-Unspecified 07/13/2013, 09/01/2015, 08/27/2016, 07/26/2017, 07/17/2018, 08/31/2019   Meningococcal polysaccharide vaccine (MPSV4) 01/15/2003   Moderna Sars-Covid-2 Vaccination 01/07/2020, 02/05/2020, 07/13/2020   PNEUMOCOCCAL CONJUGATE-20 02/13/2023   Pneumococcal Conjugate-13 09/01/2015, 07/26/2017   Pneumococcal Polysaccharide-23 01/15/2003, 08/06/2011, 07/13/2017   Pneumococcal-Unspecified 07/13/2012   Tdap 08/28/2016, 03/31/2018, 07/01/2019   Zoster Recombinant(Shingrix) 04/21/2018, 06/23/2018    TDAP status: Up to date  Flu Vaccine status: Up to date  Pneumococcal vaccine status: Up to date  Covid-19 vaccine status: Completed vaccines  Qualifies for Shingles Vaccine? Yes   Zostavax completed Yes   Shingrix Completed?: Yes  Screening Tests Health Maintenance  Topic Date Due   COVID-19 Vaccine (4 - 2023-24 season) 07/13/2022   INFLUENZA VACCINE  06/13/2023   DEXA SCAN  08/26/2023   Medicare Annual Wellness (AWV)  07/03/2024   Colonoscopy  08/12/2024   MAMMOGRAM  03/04/2025   COLON CANCER SCREENING 5 YEAR SIGMOIDOSCOPY  02/26/2028   DTaP/Tdap/Td (4 - Td or Tdap) 06/30/2029   Pneumonia Vaccine 56+ Years old  Completed   Hepatitis C Screening  Completed   Zoster Vaccines- Shingrix  Completed   HPV VACCINES  Aged Out    Health Maintenance  Health Maintenance Due  Topic Date Due   COVID-19 Vaccine (4 - 2023-24 season) 07/13/2022   INFLUENZA VACCINE  06/13/2023    Colorectal cancer screening: Type of screening: Colonoscopy. Completed 08/12/2014. Repeat every 10 years  Mammogram status: Completed  03/05/2023. Repeat every year  Bone Density status: Completed 08/25/2021. Results reflect: Bone density results: OSTEOPOROSIS. Repeat every 2 years.  Lung Cancer Screening: (Low Dose CT Chest recommended if Age 52-80 years, 20 pack-year currently smoking OR have quit w/in 15years.) does not qualify.   Lung Cancer Screening Referral: n/a  Additional Screening:  Hepatitis C Screening: does not qualify; Completed 08/31/2013  Vision Screening: Recommended annual ophthalmology exams for early detection of glaucoma and other disorders of the eye. Is the patient up to date with their annual eye exam?  Yes  Who is the provider or what is the name of the office in which the patient attends annual eye exams? Dr.Le  If pt is not established with a provider, would they like to be referred to a provider to establish care? No .   Dental Screening: Recommended annual dental exams for proper oral hygiene   Community Resource Referral / Chronic Care Management: CRR required this visit?  No   CCM required this visit?  No     Plan:     I have personally reviewed and noted the following in the patient's chart:   Medical and social history Use of alcohol, tobacco or illicit drugs  Current medications and supplements including opioid prescriptions. Patient is not currently taking opioid prescriptions. Functional ability and status Nutritional status Physical activity Advanced directives List of other physicians Hospitalizations, surgeries, and ER visits in previous 12 months Vitals Screenings to include cognitive, depression, and falls Referrals and appointments  In addition, I have reviewed and discussed with patient certain preventive protocols, quality metrics, and best practice recommendations. A written personalized care plan for preventive services as well as general preventive health recommendations were  provided to patient.     Lorrene Reid, LPN   [id number]   After Visit  Summary: (MyChart) Due to this being a telephonic visit, the after visit summary with patients personalized plan was offered to patient via MyChart   Nurse Notes: none

## 2023-07-04 NOTE — Patient Instructions (Signed)
Cassidy Barber , Thank you for taking time to come for your Medicare Wellness Visit. I appreciate your ongoing commitment to your health goals. Please review the following plan we discussed and let me know if I can assist you in the future.   Referrals/Orders/Follow-Ups/Clinician Recommendations: Aim for 30 minutes of exercise or brisk walking, 6-8 glasses of water, and 5 servings of fruits and vegetables each day.   This is a list of the screening recommended for you and due dates:  Health Maintenance  Topic Date Due   COVID-19 Vaccine (4 - 2023-24 season) 07/13/2022   Flu Shot  06/13/2023   DEXA scan (bone density measurement)  08/26/2023   Medicare Annual Wellness Visit  07/03/2024   Colon Cancer Screening  08/12/2024   Mammogram  03/04/2025   Colon Cancer Screening  02/26/2028   DTaP/Tdap/Td vaccine (4 - Td or Tdap) 06/30/2029   Pneumonia Vaccine  Completed   Hepatitis C Screening  Completed   Zoster (Shingles) Vaccine  Completed   HPV Vaccine  Aged Out    Advanced directives: (Copy Requested) Please bring a copy of your health care power of attorney and living will to the office to be added to your chart at your convenience.  Next Medicare Annual Wellness Visit scheduled for next year: Yes  Insert Preventive Care attachment Insert FALL PREVENTION attachment if needed

## 2023-07-09 ENCOUNTER — Encounter: Payer: Medicare Other | Admitting: Physician Assistant

## 2023-07-09 ENCOUNTER — Other Ambulatory Visit: Payer: Self-pay | Admitting: Family

## 2023-07-09 DIAGNOSIS — Z79899 Other long term (current) drug therapy: Secondary | ICD-10-CM

## 2023-07-09 DIAGNOSIS — F411 Generalized anxiety disorder: Secondary | ICD-10-CM

## 2023-07-09 DIAGNOSIS — F1320 Sedative, hypnotic or anxiolytic dependence, uncomplicated: Secondary | ICD-10-CM

## 2023-07-09 LAB — ANAEROBIC AND AEROBIC CULTURE

## 2023-07-09 MED ORDER — METRONIDAZOLE 500 MG PO TABS: 500.0000 mg | ORAL_TABLET | Freq: Three times a day (TID) | ORAL | 0 refills | Status: DC

## 2023-07-09 NOTE — Progress Notes (Signed)
Patient can continue Bactrim, but will add 10 day course of metronidazole to cover as well.

## 2023-07-09 NOTE — Addendum Note (Signed)
Addended by: Neale Burly on: 07/09/2023 12:46 PM   Modules accepted: Orders

## 2023-07-11 NOTE — Progress Notes (Signed)
Referring Provider Junie Spencer, FNP [address] [place],  [place] [address]   CC: No chief complaint on file.     Cassidy Barber is an 72 y.o. female.  HPI: Patient is a 72 year old female s/p excision of scalp wound with placement of myriad performed 05/13/2023 by Dr. Ulice Bold who presents to clinic for postoperative follow-up.   She was last seen here in clinic on 06/18/2023.  At that time, her and her husband had been applying K-Y jelly to the Adaptic 2-3 times per day.  On exam, the unincorporated overlying myriad sheet was removed without complication.  Underneath, there was a moderate amount of incompletely incorporated myriad morsels, but with evidence of underlying granulation at the 5 o'clock position.  Surrounding skin and tissue appeared healthy.  Recommended continued Adaptic and K-Y jelly dressing changes with return in 3 weeks for reevaluation.  She understood that there could be some increased slough on her dressings in the interim.  Reports that she has a friend who is a stylist and can assist with her hair washing.  Today, patient is doing well.  She states that her hairstylist friend has been helping her wash her hair without compromising the surgical site.  She is continuing to apply Adaptic followed by K-Y jelly and then a nonadherent Telfa pad.  This has been secured with a hat.  She denies any pain, fevers, redness, or any other symptoms or concerns.  She states that an overlying scab versus possible dried myriad fell off.   Allergies  Allergen Reactions   Ciprofloxacin Itching   Codeine Nausea Only    Outpatient Encounter Medications as of 07/16/2023  Medication Sig Note   acetaminophen (TYLENOL) 500 MG tablet Take 1,000 mg by mouth every 6 (six) hours as needed (for pain.).     alendronate (FOSAMAX) 70 MG tablet TAKE 1 TABLET EVERY WEEK 02/19/2023: thursdays   ALPRAZolam (XANAX) 0.5 MG tablet Take 1 tablet (0.5 mg total) by mouth at bedtime.    Biotin 1000 MCG  tablet Take 1,000 mcg by mouth 3 (three) times daily.    Biotin w/ Vitamins C & E (HAIR SKIN & NAILS GUMMIES PO) Take 2 tablets by mouth daily.    Calcium Carb-Cholecalciferol (CALCIUM 600 + D PO) Take 1 tablet by mouth 2 (two) times daily.    cetirizine (ZYRTEC) 10 MG tablet Take 1 tablet (10 mg total) by mouth daily as needed for allergies.    Cholecalciferol (VITAMIN D) 50 MCG (2000 UT) tablet Take 2,000 Units by mouth daily.    Clobetasol Prop Emollient Base (CLOBETASOL PROPIONATE E) 0.05 % emollient cream Apply 1 application. topically 2 (two) times daily. (Patient taking differently: Apply 1 application  topically 2 (two) times daily as needed (rash).)    clotrimazole-betamethasone (LOTRISONE) cream Apply 1 application topically 2 (two) times daily. (Patient taking differently: Apply 1 application  topically daily as needed (Rash).)    famotidine (PEPCID) 20 MG tablet Take 1 tablet (20 mg total) by mouth 2 (two) times daily.    fluorouracil (EFUDEX) 5 % cream Apply 1 application topically daily as needed (cancer spots).     fluticasone (FLONASE) 50 MCG/ACT nasal spray Place 2 sprays into both nostrils daily. (Patient taking differently: Place 2 sprays into both nostrils daily as needed for allergies.)    gabapentin (NEURONTIN) 100 MG capsule Take 1 capsule (100 mg total) by mouth daily.    hydrocortisone cream 1 % Apply 1 Application topically 2 (two) times  daily as needed for itching.    ketoconazole (NIZORAL) 2 % cream Apply 1 Application topically 2 (two) times daily.    levothyroxine (SYNTHROID) 112 MCG tablet TAKE ONE (1) TABLET BY MOUTH EVERY DAY    losartan (COZAAR) 25 MG tablet Take 1 tablet (25 mg total) by mouth daily.    metoprolol succinate (TOPROL-XL) 25 MG 24 hr tablet TAKE ONE (1) TABLET BY MOUTH EVERY DAY    metroNIDAZOLE (FLAGYL) 500 MG tablet Take 1 tablet (500 mg total) by mouth 3 (three) times daily for 10 days.    Multiple Vitamins-Minerals (MULTIVITAMIN WITH MINERALS)  tablet Take 1 tablet by mouth daily.    mupirocin ointment (BACTROBAN) 2 % Apply 1 application topically daily as needed (Cancer spots).    ondansetron (ZOFRAN) 4 MG tablet Take 1 tablet (4 mg total) by mouth every 8 (eight) hours as needed for up to 10 doses for nausea or vomiting.    pantoprazole (PROTONIX) 40 MG tablet TAKE ONE (1) TABLET BY MOUTH TWO (2) TIMES DAILY (Patient taking differently: Take 40 mg by mouth daily.)    Probiotic Product (PROBIOTIC PO) Take 1 capsule by mouth daily.    silver sulfADIAZINE (SILVADENE) 1 % cream Apply 1 Application topically daily. Apply to large surface area once to twice daily (Patient taking differently: Apply 1 Application topically daily as needed (skin irritation). Apply to large surface area once to twice daily)    sirolimus (RAPAMUNE) 1 MG tablet Take 1 mg by mouth 2 (two) times daily.    sulfamethoxazole-trimethoprim (BACTRIM DS) 800-160 MG tablet Take 1 tablet by mouth 2 (two) times daily.    tacrolimus (PROGRAF) 0.5 MG capsule Take by mouth.    ursodiol (ACTIGALL) 300 MG capsule TAKE 3 CAPSULES BY MOUTH DAILY    vitamin C (ASCORBIC ACID) 250 MG tablet Take 250 mg by mouth daily.    Vitamin D, Ergocalciferol, (DRISDOL) 1.25 MG (50000 UNIT) CAPS capsule TAKE 1 CAPSULE BY MOUTH ONCE A WEEK    vitamin E 180 MG (400 UNITS) capsule Take 400 Units by mouth daily.    No facility-administered encounter medications on file as of 07/16/2023.     Past Medical History:  Diagnosis Date   Abdominal wall hernia    Incarcerated status post surgical repair 2019 - [facility]   Anemia of chronic disease    Atypical nevus 01/21/2018   atypical neoplasm- Left scalp-ant (txpbx + MOHS), atypical neoplasm- Left scalp post- (txpbx + MOHS)   Basal cell carcinoma    Colon cancer (HCC)    Colon surgery 2005 and 2012   History of pulmonary hypertension    Pre liver transplant   Hypertension    Hypothyroidism    Lynch syndrome    Osteopenia    Primary biliary cirrhosis  (HCC)    Status post liver transplantation - follows at [facility]   SCCA (squamous cell carcinoma) of skin 02/23/2020   Right Upper Chest(moderate) (MOH's)   SCCA (squamous cell carcinoma) of skin 04/07/2020   Left Top Leg (Keratoacanthoma) treatment after biopsy   SCCA (squamous cell carcinoma) of skin 04/07/2020   Left Foot Dorsal (in situ) treatment after biopsy   SCCA (squamous cell carcinoma) of skin 03/27/2021   Right Upper Back (Keratoacanthoma) (excision) (clear)   SCCA (squamous cell carcinoma) of skin 06/13/2021   Right Shoulder - anterior (moderately differentiated) (tx p bx)   SCCA (squamous cell carcinoma) of skin 06/13/2021   Right Thigh - anterior (well differentiated) (tx p bx)  SCCA (squamous cell carcinoma) of skin 06/13/2021   Right Lower Leg - anterior (well differentiated) (tx p bx)   Squamous cell carcinoma of skin 04/22/2018   KA-Right mid chest (txpbx), KA-left elbow crease (txpbx), insitu-Right mid chest inf. (exc)   Squamous cell carcinoma of skin 05/20/2018   well diff-Left upper shin (txpbx), well diff-Right lower forearm (txpbx), well diff-Right upper shin (txpbx)   Squamous cell carcinoma of skin 06/11/2018   Scc + margin-Right mid chest inferior    Squamous cell carcinoma of skin 06/27/2018   well diff-Left mid thigh(txpbx), well diff-Left inner thigh (txpbx),insitu-Right cheek (txpbx),well diff-right inner heel (txpbx)   Squamous cell carcinoma of skin 09/16/2018   well diff-left shoulder (txpbx), well diff-Right chin (txpbx), well diff-right chest lateral (CX35FU)   Squamous cell carcinoma of skin 10/01/2018   Right outer lower shin (Txpbx)   Squamous cell carcinoma of skin 04/03/2019   well diff-Right center chest (MOHS), in situ-Right ear   Squamous cell carcinoma of skin 08/05/2019   in situ-Left calf (txpbx), in situ-left bicep (txpbx), well diff-Left chest,inf(txpbx), in situ-Right chest inf-(txpbx)   Squamous cell carcinoma of skin 11/19/2019    KA-left top leg (txpbx), modify-Riight forehead-(mohs), in situ-right hand (txpbx), in situ-Right forearm (txpbx), well diff-Right chest (txpbx), well diff-chin (txpbx)   Squamous cell carcinoma of skin 01/06/2020   KA- Left top leg   Squamous cell carcinoma of skin 05/12/2013   bowens-middle of chest (CX35FU)   Squamous cell carcinoma of skin 05/18/2015   well diff-Left upper arm (CX35FU + Exc),KA-right chest(txpbx), in situ-Left shin (txpbx), well diff-Right cheek (CX35FU), KA-Left post scalp (CX35FU)   Squamous cell carcinoma of skin 08/09/2015   KA-Left post scalp ((MOHS), in situ- mid chest (Txpbx +exc), in situ-Left upper arm inferior (txpbx)   Squamous cell carcinoma of skin 10/13/2015   Left upper arm-clear   Squamous cell carcinoma of skin 03/09/2016   mod diff-mid chest (txpbx+ exc), mod diff-Right chest (txpbx+exc), well diff-right cheek-(txpbx),well diff-Left hand-(txpbx), in situ-Left upper arm (txpbx), well diff-Right cheek -(txpbx), well diff-Right crease arm (txpbx)    Squamous cell carcinoma of skin 05/24/2016   well diff-Right nasal crease-(MOHS)   Squamous cell carcinoma of skin 08/02/2016   KA-Left chest med (txpbx)   Squamous cell carcinoma of skin 08/30/2016   in situ-Left outer zygoma (txpbx)   Squamous cell carcinoma of skin 12/06/2016   well diff-Left chest sup, Left shoudler, insitu- right post scalp   Squamous cell carcinoma of skin 02/14/2017   well diff-Left forearm (EXC),in situ-RIght ant neck   Squamous cell carcinoma of skin 06/14/2017   in situ-Right forearm (txpbx), in situ-Right chest (txpbx), well diff-left chest (txpbx), well diff-anterior neck- (txpbx)   Squamous cell carcinoma of skin 08/07/2017   well diff-Left upper shoulder (txpbx), sup and invasive-Left temple (txpbx), well diff-Right upper shin (txpbx), in situ-Right clavicle (txpbx)   Squamous cell carcinoma of skin 10/17/2017   well diff-ant. neck (MOHS), in situ-Right chest, inf (txpbx)    Squamous cell carcinoma of skin 01/21/2018   well diff- Right chest,ulnar (txpbx), well diff- right upper chest (txpbx), in situ-Right ant. crown (txpbx)   Squamous cell carcinoma of skin 08/02/2020   well diff-left lower leg-inferior (Txpbx)   Squamous cell carcinoma of skin 08/02/2020   well diff-right lower leg-mid (txpbx)   Squamous cell carcinoma of skin 08/02/2020   well diff-left chest upper   Squamous cell carcinoma of skin 08/02/2020   well diff-mid parietal scalp (MOHS)  Squamous cell carcinoma of skin 08/02/2020   well diff-right foot inner(txpbx)   Squamous cell carcinoma of skin 08/02/2020   well diff- left lower leg medial (txpbx)   Squamous cell carcinoma of skin 08/02/2020   well diff-left lower leg anterior (txpbx)   Squamous cell carcinoma of skin 08/02/2020   well diff-left lower leg medial (txpbx)   Squamous cell carcinoma of skin 08/02/2020   well diff-right forearm-posterior (txpbx)    Past Surgical History:  Procedure Laterality Date   ABDOMINAL HERNIA REPAIR     Patient's states that she has had 8- 9 hernia surgeries   ABDOMINAL HYSTERECTOMY     BIOPSY  02/15/2021   Procedure: BIOPSY;  Surgeon: Malissa Hippo, MD;  [facility];  Service: Endoscopy;;   BIOPSY  12/20/2021   Procedure: BIOPSY;  Surgeon: Malissa Hippo, MD;  [facility];  Service: Endoscopy;;   CATARACT EXTRACTION W/PHACO Right 01/15/2020   Procedure: CATARACT EXTRACTION PHACO AND INTRAOCULAR LENS PLACEMENT (IOC);  Surgeon: Fabio Pierce, MD;  Location: [facility];  Service: Ophthalmology;  Laterality: Right;  CDE: 7.89   CATARACT EXTRACTION W/PHACO Left 01/29/2020   Procedure: CATARACT EXTRACTION PHACO AND INTRAOCULAR LENS PLACEMENT (IOC) (CDE: 6.33);  Surgeon: Fabio Pierce, MD;  Location: [facility];  Service: Ophthalmology;  Laterality: Left;   CHOLECYSTECTOMY  2007   COLON SURGERY  2008   Done at [facility]   COLONOSCOPY     Done at [facility]   ESOPHAGOGASTRODUODENOSCOPY N/A  08/21/2018   Procedure: ESOPHAGOGASTRODUODENOSCOPY (EGD);  Surgeon: Malissa Hippo, MD;  [facility];  Service: Endoscopy;  Laterality: N/A;   ESOPHAGOGASTRODUODENOSCOPY (EGD) WITH PROPOFOL N/A 12/16/2019   Procedure: ESOPHAGOGASTRODUODENOSCOPY (EGD) WITH PROPOFOL;  Surgeon: Malissa Hippo, MD;  [facility];  Service: Endoscopy;  Laterality: N/A;   ESOPHAGOGASTRODUODENOSCOPY (EGD) WITH PROPOFOL N/A 12/20/2021   Procedure: ESOPHAGOGASTRODUODENOSCOPY (EGD) WITH PROPOFOL;  Surgeon: Malissa Hippo, MD;  [facility];  Service: Endoscopy;  Laterality: N/A;  9:05   ESOPHAGOGASTRODUODENOSCOPY (EGD) WITH PROPOFOL N/A 02/14/2022   Procedure: ESOPHAGOGASTRODUODENOSCOPY (EGD) WITH PROPOFOL;  Surgeon: Malissa Hippo, MD;  [facility];  Service: Endoscopy;  Laterality: N/A;  730   ESOPHAGOGASTRODUODENOSCOPY (EGD) WITH PROPOFOL N/A 06/04/2022   Procedure: ESOPHAGOGASTRODUODENOSCOPY (EGD) WITH PROPOFOL;  Surgeon: Dolores Frame, MD;  [facility];  Service: Gastroenterology;  Laterality: N/A;  145   ESOPHAGOGASTRODUODENOSCOPY (EGD) WITH PROPOFOL N/A 02/26/2023   Procedure: ESOPHAGOGASTRODUODENOSCOPY (EGD) WITH PROPOFOL;  Surgeon: Dolores Frame, MD;  [facility];  Service: Gastroenterology;  Laterality: N/A;  9:15AM;ASA 3   EYE SURGERY     lasix   FLEXIBLE SIGMOIDOSCOPY N/A 10/20/2015   Procedure: FLEXIBLE SIGMOIDOSCOPY;  Surgeon: Malissa Hippo, MD;  [facility];  Service: Endoscopy;  Laterality: N/A;  29 - Dr Karilyn Cota has meeting until 1:00   FLEXIBLE SIGMOIDOSCOPY N/A 07/11/2016   Procedure: FLEXIBLE SIGMOIDOSCOPY;  Surgeon: Malissa Hippo, MD;  [facility];  Service: Endoscopy;  Laterality: N/A;  1200   FLEXIBLE SIGMOIDOSCOPY N/A 08/09/2017   Procedure: FLEXIBLE SIGMOIDOSCOPY;  Surgeon: Malissa Hippo, MD;  [facility];  Service: Endoscopy;  Laterality: N/A;  1:00   FLEXIBLE  SIGMOIDOSCOPY N/A 08/21/2018   Procedure: FLEXIBLE SIGMOIDOSCOPY;  Surgeon: Malissa Hippo, MD;  [facility];  Service: Endoscopy;  Laterality: N/A;   FLEXIBLE SIGMOIDOSCOPY N/A 12/16/2019   Procedure: FLEXIBLE SIGMOIDOSCOPY wirh Propofol;  Surgeon:  Malissa Hippo, MD;  [facility];  Service: Endoscopy;  Laterality: N/A;  7:30   FLEXIBLE SIGMOIDOSCOPY N/A 02/15/2021   Procedure: FLEXIBLE SIGMOIDOSCOPY WITH PROPOFOL;  Surgeon: Malissa Hippo, MD;  [facility];  Service: Endoscopy;  Laterality: N/A;  am   FLEXIBLE SIGMOIDOSCOPY N/A 12/20/2021   Procedure: FLEXIBLE SIGMOIDOSCOPY;  Surgeon: Malissa Hippo, MD;  [facility];  Service: Endoscopy;  Laterality: N/A;   FLEXIBLE SIGMOIDOSCOPY N/A 02/26/2023   Procedure: FLEXIBLE SIGMOIDOSCOPY;  Surgeon: Dolores Frame, MD;  [facility];  Service: Gastroenterology;  Laterality: N/A;  9:15AM; ASA 3   FRACTURE SURGERY     right wrist metal plate   GI RADIOFREQUENCY ABLATION N/A 02/14/2022   Procedure: GI RADIOFREQUENCY ABLATION;  Surgeon: Malissa Hippo, MD;  [facility];  Service: Endoscopy;  Laterality: N/A;   HOT HEMOSTASIS N/A 12/20/2021   Procedure: HOT HEMOSTASIS (ARGON PLASMA COAGULATION/BICAP);  Surgeon: Malissa Hippo, MD;  [facility];  Service: Endoscopy;  Laterality: N/A;   HOT HEMOSTASIS  06/04/2022   Procedure: HOT HEMOSTASIS (ARGON PLASMA COAGULATION/BICAP);  Surgeon: Marguerita Merles, Reuel Boom, MD;  [facility];  Service: Gastroenterology;;   HOT HEMOSTASIS  02/26/2023   Procedure: HOT HEMOSTASIS (ARGON PLASMA COAGULATION/BICAP);  Surgeon: Marguerita Merles, Reuel Boom, MD;  [facility];  Service: Gastroenterology;;   IRRIGATION AND DEBRIDEMENT OF WOUND WITH SPLIT THICKNESS SKIN GRAFT N/A 05/13/2023   Procedure: Excision of scalp wound with Myriad or Acell placement;  Surgeon: Peggye Form, DO;  Location: [facility];  Service: Plastics;  Laterality: N/A;   LIVER TRANSPLANT  11/19/2013   POLYPECTOMY  08/09/2017   Procedure: POLYPECTOMY;  Surgeon: Malissa Hippo, MD;  [facility];  Service: Endoscopy;;  colon small bowel   POLYPECTOMY N/A 02/14/2022   Procedure: POLYPECTOMY;  Surgeon: Malissa Hippo, MD;  [facility];  Service: Endoscopy;  Laterality: N/A;   REVERSE SHOULDER ARTHROPLASTY Left 07/17/2018   Procedure: LEFT REVERSE SHOULDER ARTHROPLASTY;  Surgeon: Francena Hanly, MD;  Location: [facility];  Service: Orthopedics;  Laterality: Left;    REVERSE SHOULDER ARTHROPLASTY Right 07/20/2021   Procedure: REVERSE SHOULDER ARTHROPLASTY;  Surgeon: Francena Hanly, MD;  Location: [facility];  Service: Orthopedics;  Laterality: Right;   SHOULDER CLOSED REDUCTION Left 09/27/2019   Procedure: CLOSED REDUCTION SHOULDER;  Surgeon: Durene Romans, MD;  Location: [facility];  Service: Orthopedics;  Laterality: Left;   SPLENECTOMY  2006   TOTAL SHOULDER REVISION Left 11/12/2019   Procedure: Revision Left Reverse Shoulder Arthroplasty with poly exchange SDD;  Surgeon: Francena Hanly, MD;  Location: [facility];  Service: Orthopedics;  Laterality: Left;  -SDDC   TYMPANOSTOMY TUBE PLACEMENT     UPPER GASTROINTESTINAL ENDOSCOPY     Done at [facility]    Family History  Problem Relation Age of Onset   Prostate cancer Father    Colon cancer Father    Colon cancer Sister    Lung cancer Sister    Healthy Son    Alcohol abuse Brother    Allergic rhinitis Neg Hx    Asthma Neg Hx    Eczema Neg Hx    Urticaria Neg Hx     Social History   Social History Narrative   Patient lives in a two story home with her husband. She has an adult son. She is retired from being an Environmental health practitioner for 30 years.  Review of Systems General: Denies fevers or chills Skin: Denies bleeding or drainage  Physical Exam    07/04/2023    9:54 AM 07/04/2023    8:30 AM 06/18/2023   11:00 AM  Vitals  with BMI  Height 5\' 4"  5\' 4"    Weight 141 lbs 140 lbs   BMI 24.19 24.02   Systolic 103 -- 99  Diastolic 67 -- 66  Pulse 69  67    General:  No acute distress, nontoxic appearing  Respiratory: No increased work of breathing Neuro: Alert and oriented Psychiatric: Normal mood and affect  Skin: 2 x 1.75 cm area of depression at the surgical site.  There appears to be epithelialization, but approximately 0.25 cm depth.  Rolled borders.  No surrounding erythema or irritation.  No drainage.  No wounds noted.    Assessment/Plan  Scalp wound s/p myriad placement:  Patient is approximately 2 months postop from debridement of chronic scalp wound with bony exposure.  Myriad was placed.  At last visit, there appeared to still be some residual unincorporated myriad product with possible underlying granulation.  However, surprised today to see that there is new epithelialization.  It is depressed, approximately 0.25 - 0.5 centimeter depth.  This was relatively unexpected and instead suspected that she would instead granulate upwards prior to advancing epithelialization.  Will advise Vaseline followed by gauze and follow-up again in 1 month.  Will see if it fills up from the bottom.  Recommended she continue to exercise caution in interim.  Picture(s) obtained of the patient and placed in the chart were with the patient's or guardian's permission.   Evelena Leyden PA-C 07/11/2023, 4:14 PM

## 2023-07-12 ENCOUNTER — Ambulatory Visit: Payer: Medicare Other | Admitting: Family

## 2023-07-13 ENCOUNTER — Other Ambulatory Visit: Payer: Medicare Other

## 2023-07-16 ENCOUNTER — Ambulatory Visit: Payer: Medicare Other | Admitting: Physician Assistant

## 2023-07-16 DIAGNOSIS — S0100XD Unspecified open wound of scalp, subsequent encounter: Secondary | ICD-10-CM

## 2023-07-17 ENCOUNTER — Inpatient Hospital Stay: Payer: Medicare Other

## 2023-07-17 DIAGNOSIS — D849 Immunodeficiency, unspecified: Secondary | ICD-10-CM | POA: Diagnosis not present

## 2023-07-17 DIAGNOSIS — D500 Iron deficiency anemia secondary to blood loss (chronic): Secondary | ICD-10-CM | POA: Diagnosis not present

## 2023-07-17 DIAGNOSIS — Z944 Liver transplant status: Secondary | ICD-10-CM | POA: Diagnosis not present

## 2023-07-17 NOTE — Addendum Note (Signed)
Addended by: Cynda Acres A on: 07/17/2023 09:29 AM   Modules accepted: Orders

## 2023-07-18 ENCOUNTER — Ambulatory Visit (INDEPENDENT_AMBULATORY_CARE_PROVIDER_SITE_OTHER): Payer: Medicare Other | Admitting: Family

## 2023-07-18 ENCOUNTER — Encounter: Payer: Self-pay | Admitting: Family

## 2023-07-18 VITALS — BP 133/67 | HR 62 | Temp 97.9°F | Ht 64.0 in | Wt 139.6 lb

## 2023-07-18 DIAGNOSIS — F411 Generalized anxiety disorder: Secondary | ICD-10-CM

## 2023-07-18 DIAGNOSIS — M5432 Sciatica, left side: Secondary | ICD-10-CM

## 2023-07-18 DIAGNOSIS — E039 Hypothyroidism, unspecified: Secondary | ICD-10-CM

## 2023-07-18 DIAGNOSIS — K219 Gastro-esophageal reflux disease without esophagitis: Secondary | ICD-10-CM

## 2023-07-18 DIAGNOSIS — F1320 Sedative, hypnotic or anxiolytic dependence, uncomplicated: Secondary | ICD-10-CM

## 2023-07-18 DIAGNOSIS — L509 Urticaria, unspecified: Secondary | ICD-10-CM

## 2023-07-18 DIAGNOSIS — I10 Essential (primary) hypertension: Secondary | ICD-10-CM

## 2023-07-18 DIAGNOSIS — R1013 Epigastric pain: Secondary | ICD-10-CM

## 2023-07-18 DIAGNOSIS — Z79899 Other long term (current) drug therapy: Secondary | ICD-10-CM | POA: Diagnosis not present

## 2023-07-18 DIAGNOSIS — T8131XS Disruption of external operation (surgical) wound, not elsewhere classified, sequela: Secondary | ICD-10-CM

## 2023-07-18 DIAGNOSIS — H6993 Unspecified Eustachian tube disorder, bilateral: Secondary | ICD-10-CM

## 2023-07-18 MED ORDER — FLUTICASONE PROPIONATE 50 MCG/ACT NA SUSP
2.0000 | Freq: Every day | NASAL | 6 refills | Status: DC
Start: 2023-07-18 — End: 2024-09-10

## 2023-07-18 MED ORDER — ALENDRONATE SODIUM 70 MG PO TABS: ORAL_TABLET | ORAL | 2 refills | Status: DC

## 2023-07-18 MED ORDER — ONDANSETRON HCL 4 MG PO TABS: 4.0000 mg | ORAL_TABLET | Freq: Three times a day (TID) | ORAL | 1 refills | Status: DC | PRN

## 2023-07-18 MED ORDER — GABAPENTIN 100 MG PO CAPS
100.0000 mg | ORAL_CAPSULE | Freq: Every day | ORAL | 0 refills | Status: DC
Start: 2023-07-18 — End: 2023-09-27

## 2023-07-18 MED ORDER — METOPROLOL SUCCINATE ER 25 MG PO TB24: 25.0000 mg | ORAL_TABLET | Freq: Every day | ORAL | 1 refills | Status: DC

## 2023-07-18 MED ORDER — LOSARTAN POTASSIUM 25 MG PO TABS
25.0000 mg | ORAL_TABLET | Freq: Every day | ORAL | 4 refills | Status: AC
Start: 2023-07-18 — End: ?

## 2023-07-18 MED ORDER — ALPRAZOLAM 0.5 MG PO TABS: 0.5000 mg | ORAL_TABLET | Freq: Every day | ORAL | 1 refills | Status: DC

## 2023-07-18 MED ORDER — PANTOPRAZOLE SODIUM 40 MG PO TBEC: 40.0000 mg | DELAYED_RELEASE_TABLET | Freq: Every day | ORAL | 1 refills | Status: DC

## 2023-07-18 MED ORDER — FAMOTIDINE 20 MG PO TABS
20.0000 mg | ORAL_TABLET | Freq: Two times a day (BID) | ORAL | 5 refills | Status: DC
Start: 2023-07-18 — End: 2024-06-24

## 2023-07-18 NOTE — Progress Notes (Signed)
Subjective:    Patient ID: Cassidy Barber, female    DOB: 1951/08/19, 72 y.o.   MRN: [id number]  Chief Complaint  Patient presents with   Medical Management of Chronic Issues   PT presents to the office today for chronic follow up.  She is followed by [facility] for hx of liver transplant. She had an incarcerated ventral hernia with fluid in the hernia sac 04/05/18. She has been diagnosed with GAVE.    She is followed by Hemologists every 3 months for iron deficiency anemia.      She had a left reverse shoulder 07/17/18 and then had to redone in 09/27/19. She is scheduled for reverse shoulder arthroplasty on 07/20/21 on her right shoulder.    She currently has skin cancer on her scalp and is completed 30 radiation treatments then had 20 radiation treatments on another.  She is followed by dermatologists for recurrent squamous skin cancer on legs, arms, and face. Has purpura senilis. Stable.    She has Lynch syndrome.  She has an abdominal incision of drainage that started draining for weeks. She was seen last week in the office and had a positive culture for Bacteroides fragilis. She was given Flagyl, but made her feel horrible so she stopped after three days. She has a follow up at [facility] on 07/31/23.  Hypertension This is a chronic problem. The current episode started more than 1 year ago. The problem has been resolved since onset. The problem is controlled. Associated symptoms include anxiety and malaise/fatigue. Pertinent negatives include no peripheral edema or shortness of breath. Risk factors for coronary artery disease include dyslipidemia and sedentary lifestyle. The current treatment provides moderate improvement. Identifiable causes of hypertension include a thyroid problem.  Gastroesophageal Reflux She complains of belching, heartburn and a hoarse voice. This is a chronic problem. The current episode started more than 1 year ago. The problem occurs occasionally. Associated symptoms include  fatigue. Risk factors include obesity. She has tried a PPI for the symptoms. The treatment provided moderate relief.  Thyroid Problem Presents for follow-up visit. Symptoms include anxiety, diarrhea, dry skin, fatigue and hoarse voice. Patient reports no constipation. The symptoms have been stable.  Anemia Presents for follow-up visit. Symptoms include malaise/fatigue.  Insomnia Primary symptoms: difficulty falling asleep, malaise/fatigue.   The current episode started more than one year. The onset quality is gradual. The problem occurs intermittently. Past treatments include meditation. The treatment provided moderate relief.  Anxiety Presents for follow-up visit. Symptoms include excessive worry, insomnia, nervous/anxious behavior and restlessness. Patient reports no shortness of breath. Symptoms occur occasionally. The severity of symptoms is mild.   Her past medical history is significant for anemia.      Review of Systems  Constitutional:  Positive for fatigue and malaise/fatigue.  HENT:  Positive for hoarse voice.   Respiratory:  Negative for shortness of breath.   Gastrointestinal:  Positive for diarrhea and heartburn. Negative for constipation.  Psychiatric/Behavioral:  The patient is nervous/anxious and has insomnia.   All other systems reviewed and are negative.      Objective:   Physical Exam Vitals reviewed.  Constitutional:      General: She is not in acute distress.    Appearance: She is well-developed.  HENT:     Head: Normocephalic and atraumatic.  Eyes:     Pupils: Pupils are equal, round, and reactive to light.  Neck:     Thyroid: No thyromegaly.  Cardiovascular:     Rate and Rhythm: Normal  rate and regular rhythm.     Heart sounds: Normal heart sounds. No murmur heard. Pulmonary:     Effort: Pulmonary effort is normal. No respiratory distress.     Breath sounds: Normal breath sounds. No wheezing.  Abdominal:     General: Bowel sounds are normal. There  is no distension.     Palpations: Abdomen is soft.     Tenderness: There is no abdominal tenderness.  Musculoskeletal:        General: No tenderness. Normal range of motion.     Cervical back: Normal range of motion and neck supple.  Skin:    General: Skin is warm and dry.     Findings: Erythema present.          Comments: Incision on abdomen that is 1X0.8 cm that is crusted, purulent discharge   Neurological:     Mental Status: She is alert and oriented to person, place, and time.     Cranial Nerves: No cranial nerve deficit.     Deep Tendon Reflexes: Reflexes are normal and symmetric.  Psychiatric:        Behavior: Behavior normal.        Thought Content: Thought content normal.        Judgment: Judgment normal.       BP 133/67   Pulse 62   Temp 97.9 F (36.6 C) (Temporal)   Ht 5\' 4"  (1.626 m)   Wt 139 lb 9.6 oz (63.3 kg)   SpO2 96%   BMI 23.96 kg/m      Assessment & Plan:  Cassidy Barber comes in today with chief complaint of Medical Management of Chronic Issues   Diagnosis and orders addressed:  1. GAD (generalized anxiety disorder) - ALPRAZolam (XANAX) 0.5 MG tablet; Take 1 tablet (0.5 mg total) by mouth at bedtime.  Dispense: 90 tablet; Refill: 1  2. Benzodiazepine dependence (HCC) - ALPRAZolam (XANAX) 0.5 MG tablet; Take 1 tablet (0.5 mg total) by mouth at bedtime.  Dispense: 90 tablet; Refill: 1  3. Controlled substance agreement signed - ALPRAZolam (XANAX) 0.5 MG tablet; Take 1 tablet (0.5 mg total) by mouth at bedtime.  Dispense: 90 tablet; Refill: 1 - ToxASSURE Select 13 (MW), Urine  4. Urticaria - famotidine (PEPCID) 20 MG tablet; Take 1 tablet (20 mg total) by mouth 2 (two) times daily.  Dispense: 60 tablet; Refill: 5  5. Dysfunction of both eustachian tubes  - fluticasone (FLONASE) 50 MCG/ACT nasal spray; Place 2 sprays into both nostrils daily.  Dispense: 16 g; Refill: 6  6. Left sciatic nerve pain - gabapentin (NEURONTIN) 100 MG capsule; Take  1 capsule (100 mg total) by mouth daily.  Dispense: 90 capsule; Refill: 0  7. Essential hypertension, benign  - losartan (COZAAR) 25 MG tablet; Take 1 tablet (25 mg total) by mouth daily.  Dispense: 90 tablet; Refill: 4 - metoprolol succinate (TOPROL-XL) 25 MG 24 hr tablet; Take 1 tablet (25 mg total) by mouth daily.  Dispense: 90 tablet; Refill: 1  8. Epigastric pain - pantoprazole (PROTONIX) 40 MG tablet; Take 1 tablet (40 mg total) by mouth daily.  Dispense: 90 tablet; Refill: 1  9. Gastroesophageal reflux disease, unspecified whether esophagitis present - pantoprazole (PROTONIX) 40 MG tablet; Take 1 tablet (40 mg total) by mouth daily.  Dispense: 90 tablet; Refill: 1  10. Hypothyroidism, unspecified type  11. Primary hypertension   12. Open abdominal incision with drainage, sequela Long discussion with patient. Recommend she restart the Flagyl and start  zofran 30 mins prior. All other medications will be IV. I have placed a referral to ID to help set this up if patient can not tolerate Flagyl.  - Ambulatory referral to Infectious Disease - ondansetron (ZOFRAN) 4 MG tablet; Take 1 tablet (4 mg total) by mouth every 8 (eight) hours as needed for nausea or vomiting.  Dispense: 90 tablet; Refill: 1   Labs pending Continue Current medications  Patient reviewed in [place] controlled database, no flags noted. Contract and drug screen up dated today.  Health Maintenance reviewed Diet and exercise encouraged  Follow up plan: 3 months    Jannifer Rodney, FNP

## 2023-07-19 ENCOUNTER — Inpatient Hospital Stay: Payer: Medicare Other

## 2023-07-22 ENCOUNTER — Telehealth: Payer: Self-pay | Admitting: Family

## 2023-07-22 DIAGNOSIS — M9903 Segmental and somatic dysfunction of lumbar region: Secondary | ICD-10-CM | POA: Diagnosis not present

## 2023-07-22 DIAGNOSIS — S134XXA Sprain of ligaments of cervical spine, initial encounter: Secondary | ICD-10-CM | POA: Diagnosis not present

## 2023-07-22 DIAGNOSIS — M9902 Segmental and somatic dysfunction of thoracic region: Secondary | ICD-10-CM | POA: Diagnosis not present

## 2023-07-22 DIAGNOSIS — M9901 Segmental and somatic dysfunction of cervical region: Secondary | ICD-10-CM | POA: Diagnosis not present

## 2023-07-22 NOTE — Telephone Encounter (Signed)
SPOKE AND ANSWERED ALL QUESTIONS AND FAXED RESULTS TO [facility]

## 2023-07-22 NOTE — Telephone Encounter (Signed)
Pt called requesting to speak directly with Christys nurse. Has questions. Is receiving calls from [facility] and [facility].

## 2023-07-23 DIAGNOSIS — K862 Cyst of pancreas: Secondary | ICD-10-CM | POA: Diagnosis not present

## 2023-07-23 DIAGNOSIS — Z944 Liver transplant status: Secondary | ICD-10-CM | POA: Diagnosis not present

## 2023-07-23 DIAGNOSIS — Z9081 Acquired absence of spleen: Secondary | ICD-10-CM | POA: Diagnosis not present

## 2023-07-23 NOTE — Progress Notes (Unsigned)
VIRTUAL VISIT via TELEPHONE NOTE [facility]   I connected with Cassidy Barber  on 07/24/23 at 8:15 AM by telephone and verified that I am speaking with the correct person using two identifiers.  Location: Patient: Home Provider: [facility]   I discussed the limitations, risks, security and privacy concerns of performing an evaluation and management service by telephone and the availability of in person appointments. I also discussed with the patient that there may be a patient responsible charge related to this service. The patient expressed understanding and agreed to proceed.  REASON FOR VISIT: Iron deficiency anemia   CURRENT THERAPY: Intermittent IV iron  INTERVAL HISTORY:  [name] is contacted today for follow-up of her iron deficiency anemia.  She was last evaluated via telemedicine visit by Rojelio Brenner PA-C on 04/15/2023. She last received Venofer 300 mg x 3 in December 2023, and reports feeling unchanged after IV iron.  Since her last visit, she had surgical excision and repair of scalp wound by plastic surgery in July 2024.  She is currently being treated for wound dehiscence and infection related to abdominal surgery that occurred 10 years ago.  She is following closely with infectious disease and transplant team at [facility].  She continues to have fatigue with energy that "comes and goes," and reports that she tires easily.  She continues to have intermittent loose, dark bowel movements which have been chronic ever since her colectomy.  This is unchanged, despite most recent EGD/APC treatment of GAVE on 02/26/2023.   No gross hematochezia, epistaxis, or other source of blood loss.  She been having some mild dyspnea on exertion.  No pica, restless legs, chest pain, lightheadedness, or syncope.    She reports little to no energy and 100% appetite.  She is maintaining a stable weight at this time.  REVIEW OF SYSTEMS:   Review of Systems   Constitutional:  Positive for malaise/fatigue. Negative for chills, diaphoresis, fever and weight loss.  Respiratory:  Negative for cough and shortness of breath.   Cardiovascular:  Negative for chest pain and palpitations.  Gastrointestinal:  Positive for diarrhea. Negative for abdominal pain, blood in stool, melena, nausea and vomiting.  Musculoskeletal:  Positive for back pain.  Neurological:  Negative for dizziness and headaches.     PHYSICAL EXAM: (per limitations of virtual telephone visit)  The patient is alert and oriented x 3, exhibiting adequate mentation, good mood, and ability to speak in full sentences and execute sound judgement.  ASSESSMENT & PLAN:  1.  Iron deficiency anemia from occult GI blood loss: - Unable to tolerate oral iron. - Secondary to history of colon cancer s/p abdominal colectomy, malabsorption, and chronic blood loss from GAVE -  Most recent EGD (02/26/2023) showed GAVE without bleeding, treated with APC. - Most recent sigmoidoscopy (02/26/2023): Patent end-to-side ileocolonic anastomosis with healthy-appearing mucosa; nonbleeding internal hemorrhoids - SPEP negative in 2019 - She last received IV iron (Venofer 300 mg x 3) in December 2023  - Currently symptomatic with ongoing fatigue and mild DOE - no improvement in symptoms after IV iron - Dark stools ever since her colectomy.  Denies any epistaxis or rectal hemorrhage - Most recent labs (07/17/2023 via [facility]): Hgb 14.3/MCV 94, ferritin 1130, iron saturation 23% Previous labs (04/01/2023) showed ferritin 77 with iron saturation 29% - PLAN: Suspect elevated ferritin to be acute phase reactant in the setting of infection.  We will hold off on any IV iron at  this time, contraindicated during infection.  Patient agrees. - Labs in 3 months, phone visit 1 week after  - Continue follow-up with GI (Dr. Levon Hedger) for management of any GI blood loss   2.  Vitamin B12 deficiency  - She was previously taking Vitamin  B12 tablet (2000 mcg) once daily (until February 2024) - Labs from 01/10/2023 showed elevated B12 at 1563, therefore OTC supplement was stopped by her PCP. - Most recent labs (07/17/2023): Vitamin B12 1300, normal MMA 218 - PLAN: Will occasionally restart B12 supplement at this time.   Will recheck B12/MMA in 6 months (March 2025).  3.  Liver Transplant: - Had this completed at [facility] in January 2015.  - She is on immunosuppression with sirolimus.   4.  Skin cancer on her scalp: - Secondary to chronic immunosuppression for liver transplant; lynch syndrome - She is status post 30 treatments of radiation to her scalp. - She is followed by dermatology and has had several areas treated recently with topical creams. - Plastic surgery for excision of scalp wound on 05/13/2023   5.  History of colon cancer with [facility] SYNDROME: - Status post abdominal colectomy on 09/17/2011 with simultaneous total abdominal hysterectomy and bilateral salpingo-oophorectomy. - Found to have Lynch syndrome. - Per recommendations she is to have an EGD every 2 to 3 years and annual colonoscopy for review of her residual colon tissue at the anastomosis. - Sigmoidoscopy (02/15/2021): Two nonbleeding erosions at ileocolonic anastomosis, small external hemorrhoids  PLAN SUMMARY: >> Same-day labs (CBC/D, ferritin, iron/TIBC) + OFFICE visit in 3 months   ** Last OFFICE visit was in November 2023     I discussed the assessment and treatment plan with the patient. The patient was provided an opportunity to ask questions and all were answered. The patient agreed with the plan and demonstrated an understanding of the instructions.   The patient was advised to call back or seek an in-person evaluation if the symptoms worsen or if the condition fails to improve as anticipated.  I provided 21 minutes of non-face-to-face time during this encounter.  Carnella Guadalajara, PA-C 07/24/23 8:36 AM

## 2023-07-24 ENCOUNTER — Encounter: Payer: Self-pay | Admitting: Physician Assistant

## 2023-07-24 ENCOUNTER — Inpatient Hospital Stay: Payer: Medicare Other | Attending: Hematology | Admitting: Physician Assistant

## 2023-07-24 DIAGNOSIS — Z944 Liver transplant status: Secondary | ICD-10-CM

## 2023-07-24 DIAGNOSIS — K922 Gastrointestinal hemorrhage, unspecified: Secondary | ICD-10-CM | POA: Diagnosis not present

## 2023-07-24 DIAGNOSIS — D500 Iron deficiency anemia secondary to blood loss (chronic): Secondary | ICD-10-CM

## 2023-07-24 LAB — TOXASSURE SELECT 13 (MW), URINE

## 2023-07-25 DIAGNOSIS — D849 Immunodeficiency, unspecified: Secondary | ICD-10-CM | POA: Diagnosis not present

## 2023-07-26 DIAGNOSIS — M9903 Segmental and somatic dysfunction of lumbar region: Secondary | ICD-10-CM | POA: Diagnosis not present

## 2023-07-26 DIAGNOSIS — S134XXA Sprain of ligaments of cervical spine, initial encounter: Secondary | ICD-10-CM | POA: Diagnosis not present

## 2023-07-26 DIAGNOSIS — M9902 Segmental and somatic dysfunction of thoracic region: Secondary | ICD-10-CM | POA: Diagnosis not present

## 2023-07-26 DIAGNOSIS — M9901 Segmental and somatic dysfunction of cervical region: Secondary | ICD-10-CM | POA: Diagnosis not present

## 2023-07-29 DIAGNOSIS — S134XXA Sprain of ligaments of cervical spine, initial encounter: Secondary | ICD-10-CM | POA: Diagnosis not present

## 2023-07-29 DIAGNOSIS — M9902 Segmental and somatic dysfunction of thoracic region: Secondary | ICD-10-CM | POA: Diagnosis not present

## 2023-07-29 DIAGNOSIS — M9901 Segmental and somatic dysfunction of cervical region: Secondary | ICD-10-CM | POA: Diagnosis not present

## 2023-07-29 DIAGNOSIS — M9903 Segmental and somatic dysfunction of lumbar region: Secondary | ICD-10-CM | POA: Diagnosis not present

## 2023-07-31 DIAGNOSIS — R748 Abnormal levels of other serum enzymes: Secondary | ICD-10-CM | POA: Diagnosis not present

## 2023-07-31 DIAGNOSIS — Z944 Liver transplant status: Secondary | ICD-10-CM | POA: Diagnosis not present

## 2023-07-31 DIAGNOSIS — X58XXXA Exposure to other specified factors, initial encounter: Secondary | ICD-10-CM | POA: Diagnosis not present

## 2023-07-31 DIAGNOSIS — D849 Immunodeficiency, unspecified: Secondary | ICD-10-CM | POA: Diagnosis not present

## 2023-07-31 DIAGNOSIS — K7460 Unspecified cirrhosis of liver: Secondary | ICD-10-CM | POA: Diagnosis not present

## 2023-07-31 DIAGNOSIS — T8131XA Disruption of external operation (surgical) wound, not elsewhere classified, initial encounter: Secondary | ICD-10-CM | POA: Diagnosis not present

## 2023-08-01 DIAGNOSIS — M9901 Segmental and somatic dysfunction of cervical region: Secondary | ICD-10-CM | POA: Diagnosis not present

## 2023-08-01 DIAGNOSIS — M9903 Segmental and somatic dysfunction of lumbar region: Secondary | ICD-10-CM | POA: Diagnosis not present

## 2023-08-01 DIAGNOSIS — S134XXA Sprain of ligaments of cervical spine, initial encounter: Secondary | ICD-10-CM | POA: Diagnosis not present

## 2023-08-01 DIAGNOSIS — M9902 Segmental and somatic dysfunction of thoracic region: Secondary | ICD-10-CM | POA: Diagnosis not present

## 2023-08-05 ENCOUNTER — Other Ambulatory Visit: Payer: Self-pay | Admitting: Student

## 2023-08-05 DIAGNOSIS — T8131XA Disruption of external operation (surgical) wound, not elsewhere classified, initial encounter: Secondary | ICD-10-CM

## 2023-08-05 DIAGNOSIS — Z944 Liver transplant status: Secondary | ICD-10-CM

## 2023-08-06 ENCOUNTER — Ambulatory Visit: Payer: Medicare Other | Admitting: Internal Medicine

## 2023-08-07 DIAGNOSIS — M9903 Segmental and somatic dysfunction of lumbar region: Secondary | ICD-10-CM | POA: Diagnosis not present

## 2023-08-07 DIAGNOSIS — M9901 Segmental and somatic dysfunction of cervical region: Secondary | ICD-10-CM | POA: Diagnosis not present

## 2023-08-07 DIAGNOSIS — S134XXA Sprain of ligaments of cervical spine, initial encounter: Secondary | ICD-10-CM | POA: Diagnosis not present

## 2023-08-07 DIAGNOSIS — M9902 Segmental and somatic dysfunction of thoracic region: Secondary | ICD-10-CM | POA: Diagnosis not present

## 2023-08-08 DIAGNOSIS — Z944 Liver transplant status: Secondary | ICD-10-CM | POA: Diagnosis not present

## 2023-08-08 DIAGNOSIS — K080 Exfoliation of teeth due to systemic causes: Secondary | ICD-10-CM | POA: Diagnosis not present

## 2023-08-08 DIAGNOSIS — D849 Immunodeficiency, unspecified: Secondary | ICD-10-CM | POA: Diagnosis not present

## 2023-08-09 ENCOUNTER — Other Ambulatory Visit: Payer: Medicare Other

## 2023-08-12 NOTE — Progress Notes (Unsigned)
Referring Provider Junie Spencer, FNP [address] [place],  [place] [address]   CC:  Chief Complaint  Patient presents with   Wound Check      Cassidy Barber is an 72 y.o. female.  HPI: Patient is a 72 year old female s/p excision of scalp wound with placement of myriad performed 05/13/2023 by Dr. Ulice Bold who presents to clinic for postoperative follow-up.   She was last seen here in clinic 07/16/2023.  At that time, she was continuing with Adaptic and K-Y jelly followed by nonadherent Telfa pad.  This is secured with a hat.  Area of depression measured approximately 2 x 1.75 cm, 0.25 cm depth.  It appeared to of epithelialized, but with an area of depression.  Suspected that she would have granulated prior to epithelialization, but will continue with Vaseline gauze and follow-up in 1 month.  Today, she is doing well.  She reports that she is putting on Vaseline intermittently.  She is pleased with the outcome of her surgery and feels as though there has been some improvement in filling in since last encounter.  Denies any discomfort and feels as though she will no longer require further follow-up.  Patient reports that she has a week at home to help cover her area of baldness related to her radiation treatments.  Allergies  Allergen Reactions   Ciprofloxacin Itching   Codeine Nausea Only    Outpatient Encounter Medications as of 08/13/2023  Medication Sig   acetaminophen (TYLENOL) 500 MG tablet Take 1,000 mg by mouth every 6 (six) hours as needed (for pain.).    alendronate (FOSAMAX) 70 MG tablet TAKE 1 TABLET EVERY WEEK   ALPRAZolam (XANAX) 0.5 MG tablet Take 1 tablet (0.5 mg total) by mouth at bedtime.   Biotin 1000 MCG tablet Take 1,000 mcg by mouth 3 (three) times daily.   Biotin w/ Vitamins C & E (HAIR SKIN & NAILS GUMMIES PO) Take 2 tablets by mouth daily.   Calcium Carb-Cholecalciferol (CALCIUM 600 + D PO) Take 1 tablet by mouth 2 (two) times daily.   cetirizine  (ZYRTEC) 10 MG tablet Take 1 tablet (10 mg total) by mouth daily as needed for allergies.   Cholecalciferol (VITAMIN D) 50 MCG (2000 UT) tablet Take 2,000 Units by mouth daily.   Clobetasol Prop Emollient Base (CLOBETASOL PROPIONATE E) 0.05 % emollient cream Apply 1 application. topically 2 (two) times daily. (Patient taking differently: Apply 1 application  topically 2 (two) times daily as needed (rash).)   clotrimazole-betamethasone (LOTRISONE) cream Apply 1 application topically 2 (two) times daily. (Patient taking differently: Apply 1 application  topically daily as needed (Rash).)   famotidine (PEPCID) 20 MG tablet Take 1 tablet (20 mg total) by mouth 2 (two) times daily.   fluorouracil (EFUDEX) 5 % cream Apply 1 application topically daily as needed (cancer spots).    fluticasone (FLONASE) 50 MCG/ACT nasal spray Place 2 sprays into both nostrils daily.   gabapentin (NEURONTIN) 100 MG capsule Take 1 capsule (100 mg total) by mouth daily.   hydrocortisone cream 1 % Apply 1 Application topically 2 (two) times daily as needed for itching.   ketoconazole (NIZORAL) 2 % cream Apply 1 Application topically 2 (two) times daily.   levothyroxine (SYNTHROID) 112 MCG tablet TAKE ONE (1) TABLET BY MOUTH EVERY DAY   losartan (COZAAR) 25 MG tablet Take 1 tablet (25 mg total) by mouth daily.   metoprolol succinate (TOPROL-XL) 25 MG 24 hr tablet Take 1 tablet (25  mg total) by mouth daily.   metroNIDAZOLE (FLAGYL) 500 MG tablet Take 500 mg by mouth 3 (three) times daily.   Multiple Vitamins-Minerals (MULTIVITAMIN WITH MINERALS) tablet Take 1 tablet by mouth daily.   mupirocin ointment (BACTROBAN) 2 % Apply 1 application topically daily as needed (Cancer spots).   ondansetron (ZOFRAN) 4 MG tablet Take 1 tablet (4 mg total) by mouth every 8 (eight) hours as needed for nausea or vomiting.   pantoprazole (PROTONIX) 40 MG tablet Take 1 tablet (40 mg total) by mouth daily.   Probiotic Product (PROBIOTIC PO) Take 1  capsule by mouth daily.   silver sulfADIAZINE (SILVADENE) 1 % cream Apply 1 Application topically daily. Apply to large surface area once to twice daily (Patient taking differently: Apply 1 Application topically daily as needed (skin irritation). Apply to large surface area once to twice daily)   sirolimus (RAPAMUNE) 1 MG tablet Take 1 mg by mouth 2 (two) times daily.   tacrolimus (PROGRAF) 0.5 MG capsule Take by mouth.   ursodiol (ACTIGALL) 300 MG capsule TAKE 3 CAPSULES BY MOUTH DAILY   vitamin C (ASCORBIC ACID) 250 MG tablet Take 250 mg by mouth daily.   Vitamin D, Ergocalciferol, (DRISDOL) 1.25 MG (50000 UNIT) CAPS capsule TAKE 1 CAPSULE BY MOUTH ONCE A WEEK   vitamin E 180 MG (400 UNITS) capsule Take 400 Units by mouth daily.   No facility-administered encounter medications on file as of 08/13/2023.     Past Medical History:  Diagnosis Date   Abdominal wall hernia    Incarcerated status post surgical repair 2019 - [facility]   Anemia of chronic disease    Atypical nevus 01/21/2018   atypical neoplasm- Left scalp-ant (txpbx + MOHS), atypical neoplasm- Left scalp post- (txpbx + MOHS)   Basal cell carcinoma    Colon cancer (HCC)    Colon surgery 2005 and 2012   History of pulmonary hypertension    Pre liver transplant   Hypertension    Hypothyroidism    Lynch syndrome    Osteopenia    Primary biliary cirrhosis (HCC)    Status post liver transplantation - follows at [facility]   SCCA (squamous cell carcinoma) of skin 02/23/2020   Right Upper Chest(moderate) (MOH's)   SCCA (squamous cell carcinoma) of skin 04/07/2020   Left Top Leg (Keratoacanthoma) treatment after biopsy   SCCA (squamous cell carcinoma) of skin 04/07/2020   Left Foot Dorsal (in situ) treatment after biopsy   SCCA (squamous cell carcinoma) of skin 03/27/2021   Right Upper Back (Keratoacanthoma) (excision) (clear)   SCCA (squamous cell carcinoma) of skin 06/13/2021   Right Shoulder - anterior (moderately differentiated) (tx  p bx)   SCCA (squamous cell carcinoma) of skin 06/13/2021   Right Thigh - anterior (well differentiated) (tx p bx)   SCCA (squamous cell carcinoma) of skin 06/13/2021   Right Lower Leg - anterior (well differentiated) (tx p bx)   Squamous cell carcinoma of skin 04/22/2018   KA-Right mid chest (txpbx), KA-left elbow crease (txpbx), insitu-Right mid chest inf. (exc)   Squamous cell carcinoma of skin 05/20/2018   well diff-Left upper shin (txpbx), well diff-Right lower forearm (txpbx), well diff-Right upper shin (txpbx)   Squamous cell carcinoma of skin 06/11/2018   Scc + margin-Right mid chest inferior    Squamous cell carcinoma of skin 06/27/2018   well diff-Left mid thigh(txpbx), well diff-Left inner thigh (txpbx),insitu-Right cheek (txpbx),well diff-right inner heel (txpbx)   Squamous cell carcinoma of skin 09/16/2018   well  diff-left shoulder (txpbx), well diff-Right chin (txpbx), well diff-right chest lateral (CX35FU)   Squamous cell carcinoma of skin 10/01/2018   Right outer lower shin (Txpbx)   Squamous cell carcinoma of skin 04/03/2019   well diff-Right center chest (MOHS), in situ-Right ear   Squamous cell carcinoma of skin 08/05/2019   in situ-Left calf (txpbx), in situ-left bicep (txpbx), well diff-Left chest,inf(txpbx), in situ-Right chest inf-(txpbx)   Squamous cell carcinoma of skin 11/19/2019   KA-left top leg (txpbx), modify-Riight forehead-(mohs), in situ-right hand (txpbx), in situ-Right forearm (txpbx), well diff-Right chest (txpbx), well diff-chin (txpbx)   Squamous cell carcinoma of skin 01/06/2020   KA- Left top leg   Squamous cell carcinoma of skin 05/12/2013   bowens-middle of chest (CX35FU)   Squamous cell carcinoma of skin 05/18/2015   well diff-Left upper arm (CX35FU + Exc),KA-right chest(txpbx), in situ-Left shin (txpbx), well diff-Right cheek (CX35FU), KA-Left post scalp (CX35FU)   Squamous cell carcinoma of skin 08/09/2015   KA-Left post scalp ((MOHS), in  situ- mid chest (Txpbx +exc), in situ-Left upper arm inferior (txpbx)   Squamous cell carcinoma of skin 10/13/2015   Left upper arm-clear   Squamous cell carcinoma of skin 03/09/2016   mod diff-mid chest (txpbx+ exc), mod diff-Right chest (txpbx+exc), well diff-right cheek-(txpbx),well diff-Left hand-(txpbx), in situ-Left upper arm (txpbx), well diff-Right cheek -(txpbx), well diff-Right crease arm (txpbx)    Squamous cell carcinoma of skin 05/24/2016   well diff-Right nasal crease-(MOHS)   Squamous cell carcinoma of skin 08/02/2016   KA-Left chest med (txpbx)   Squamous cell carcinoma of skin 08/30/2016   in situ-Left outer zygoma (txpbx)   Squamous cell carcinoma of skin 12/06/2016   well diff-Left chest sup, Left shoudler, insitu- right post scalp   Squamous cell carcinoma of skin 02/14/2017   well diff-Left forearm (EXC),in situ-RIght ant neck   Squamous cell carcinoma of skin 06/14/2017   in situ-Right forearm (txpbx), in situ-Right chest (txpbx), well diff-left chest (txpbx), well diff-anterior neck- (txpbx)   Squamous cell carcinoma of skin 08/07/2017   well diff-Left upper shoulder (txpbx), sup and invasive-Left temple (txpbx), well diff-Right upper shin (txpbx), in situ-Right clavicle (txpbx)   Squamous cell carcinoma of skin 10/17/2017   well diff-ant. neck (MOHS), in situ-Right chest, inf (txpbx)   Squamous cell carcinoma of skin 01/21/2018   well diff- Right chest,ulnar (txpbx), well diff- right upper chest (txpbx), in situ-Right ant. crown (txpbx)   Squamous cell carcinoma of skin 08/02/2020   well diff-left lower leg-inferior (Txpbx)   Squamous cell carcinoma of skin 08/02/2020   well diff-right lower leg-mid (txpbx)   Squamous cell carcinoma of skin 08/02/2020   well diff-left chest upper   Squamous cell carcinoma of skin 08/02/2020   well diff-mid parietal scalp (MOHS)   Squamous cell carcinoma of skin 08/02/2020   well diff-right foot inner(txpbx)   Squamous cell  carcinoma of skin 08/02/2020   well diff- left lower leg medial (txpbx)   Squamous cell carcinoma of skin 08/02/2020   well diff-left lower leg anterior (txpbx)   Squamous cell carcinoma of skin 08/02/2020   well diff-left lower leg medial (txpbx)   Squamous cell carcinoma of skin 08/02/2020   well diff-right forearm-posterior (txpbx)    Past Surgical History:  Procedure Laterality Date   ABDOMINAL HERNIA REPAIR     Patient's states that she has had 8- 9 hernia surgeries   ABDOMINAL HYSTERECTOMY     BIOPSY  02/15/2021   Procedure: BIOPSY;  Surgeon: Malissa Hippo, MD;  [facility];  Service: Endoscopy;;   BIOPSY  12/20/2021   Procedure: BIOPSY;  Surgeon: Malissa Hippo, MD;  [facility];  Service: Endoscopy;;   CATARACT EXTRACTION W/PHACO Right 01/15/2020   Procedure: CATARACT EXTRACTION PHACO AND INTRAOCULAR LENS PLACEMENT (IOC);  Surgeon: Fabio Pierce, MD;  Location: [facility];  Service: Ophthalmology;  Laterality: Right;  CDE: 7.89   CATARACT EXTRACTION W/PHACO Left 01/29/2020   Procedure: CATARACT EXTRACTION PHACO AND INTRAOCULAR LENS PLACEMENT (IOC) (CDE: 6.33);  Surgeon: Fabio Pierce, MD;  Location: [facility];  Service: Ophthalmology;  Laterality: Left;   CHOLECYSTECTOMY  2007   COLON SURGERY  2008   Done at [facility]   COLONOSCOPY     Done at [facility]   ESOPHAGOGASTRODUODENOSCOPY N/A 08/21/2018   Procedure: ESOPHAGOGASTRODUODENOSCOPY (EGD);  Surgeon: Malissa Hippo, MD;  [facility];  Service: Endoscopy;  Laterality: N/A;   ESOPHAGOGASTRODUODENOSCOPY (EGD) WITH PROPOFOL N/A 12/16/2019   Procedure: ESOPHAGOGASTRODUODENOSCOPY (EGD) WITH PROPOFOL;  Surgeon: Malissa Hippo, MD;  [facility];  Service: Endoscopy;  Laterality: N/A;   ESOPHAGOGASTRODUODENOSCOPY (EGD) WITH PROPOFOL N/A 12/20/2021   Procedure: ESOPHAGOGASTRODUODENOSCOPY (EGD) WITH PROPOFOL;  Surgeon: Malissa Hippo, MD;  [facility];  Service: Endoscopy;  Laterality:  N/A;  9:05   ESOPHAGOGASTRODUODENOSCOPY (EGD) WITH PROPOFOL N/A 02/14/2022   Procedure: ESOPHAGOGASTRODUODENOSCOPY (EGD) WITH PROPOFOL;  Surgeon: Malissa Hippo, MD;  [facility];  Service: Endoscopy;  Laterality: N/A;  730   ESOPHAGOGASTRODUODENOSCOPY (EGD) WITH PROPOFOL N/A 06/04/2022   Procedure: ESOPHAGOGASTRODUODENOSCOPY (EGD) WITH PROPOFOL;  Surgeon: Dolores Frame, MD;  [facility];  Service: Gastroenterology;  Laterality: N/A;  145   ESOPHAGOGASTRODUODENOSCOPY (EGD) WITH PROPOFOL N/A 02/26/2023   Procedure: ESOPHAGOGASTRODUODENOSCOPY (EGD) WITH PROPOFOL;  Surgeon: Dolores Frame, MD;  [facility];  Service: Gastroenterology;  Laterality: N/A;  9:15AM;ASA 3   EYE SURGERY     lasix   FLEXIBLE SIGMOIDOSCOPY N/A 10/20/2015   Procedure: FLEXIBLE SIGMOIDOSCOPY;  Surgeon: Malissa Hippo, MD;  [facility];  Service: Endoscopy;  Laterality: N/A;  37 - Dr Karilyn Cota has meeting until 1:00   FLEXIBLE SIGMOIDOSCOPY N/A 07/11/2016   Procedure: FLEXIBLE SIGMOIDOSCOPY;  Surgeon: Malissa Hippo, MD;  [facility];  Service: Endoscopy;  Laterality: N/A;  1200   FLEXIBLE SIGMOIDOSCOPY N/A 08/09/2017   Procedure: FLEXIBLE SIGMOIDOSCOPY;  Surgeon: Malissa Hippo, MD;  [facility];  Service: Endoscopy;  Laterality: N/A;  1:00   FLEXIBLE SIGMOIDOSCOPY N/A 08/21/2018   Procedure: FLEXIBLE SIGMOIDOSCOPY;  Surgeon: Malissa Hippo, MD;  [facility];  Service: Endoscopy;  Laterality: N/A;   FLEXIBLE SIGMOIDOSCOPY N/A 12/16/2019   Procedure: FLEXIBLE SIGMOIDOSCOPY wirh Propofol;  Surgeon: Malissa Hippo, MD;  [facility];  Service: Endoscopy;  Laterality: N/A;  7:30   FLEXIBLE SIGMOIDOSCOPY N/A 02/15/2021   Procedure: FLEXIBLE SIGMOIDOSCOPY WITH PROPOFOL;  Surgeon: Malissa Hippo, MD;  [facility];  Service: Endoscopy;  Laterality: N/A;  am   FLEXIBLE SIGMOIDOSCOPY N/A 12/20/2021    Procedure: FLEXIBLE SIGMOIDOSCOPY;  Surgeon: Malissa Hippo, MD;  [facility];  Service: Endoscopy;  Laterality: N/A;   FLEXIBLE SIGMOIDOSCOPY N/A 02/26/2023   Procedure: FLEXIBLE SIGMOIDOSCOPY;  Surgeon: Dolores Frame, MD;  [facility];  Service: Gastroenterology;  Laterality: N/A;  9:15AM; ASA 3   FRACTURE SURGERY     right wrist metal  plate   GI RADIOFREQUENCY ABLATION N/A 02/14/2022   Procedure: GI RADIOFREQUENCY ABLATION;  Surgeon: Malissa Hippo, MD;  [facility];  Service: Endoscopy;  Laterality: N/A;   HOT HEMOSTASIS N/A 12/20/2021   Procedure: HOT HEMOSTASIS (ARGON PLASMA COAGULATION/BICAP);  Surgeon: Malissa Hippo, MD;  [facility];  Service: Endoscopy;  Laterality: N/A;   HOT HEMOSTASIS  06/04/2022   Procedure: HOT HEMOSTASIS (ARGON PLASMA COAGULATION/BICAP);  Surgeon: Marguerita Merles, Reuel Boom, MD;  [facility];  Service: Gastroenterology;;   HOT HEMOSTASIS  02/26/2023   Procedure: HOT HEMOSTASIS (ARGON PLASMA COAGULATION/BICAP);  Surgeon: Marguerita Merles, Reuel Boom, MD;  [facility];  Service: Gastroenterology;;   IRRIGATION AND DEBRIDEMENT OF WOUND WITH SPLIT THICKNESS SKIN GRAFT N/A 05/13/2023   Procedure: Excision of scalp wound with Myriad or Acell placement;  Surgeon: Peggye Form, DO;  Location: [facility];  Service: Plastics;  Laterality: N/A;   LIVER TRANSPLANT  11/19/2013   POLYPECTOMY  08/09/2017   Procedure: POLYPECTOMY;  Surgeon: Malissa Hippo, MD;  [facility];  Service: Endoscopy;;  colon small bowel   POLYPECTOMY N/A 02/14/2022   Procedure: POLYPECTOMY;  Surgeon: Malissa Hippo, MD;  [facility];  Service: Endoscopy;  Laterality: N/A;   REVERSE SHOULDER ARTHROPLASTY Left 07/17/2018   Procedure: LEFT REVERSE SHOULDER ARTHROPLASTY;  Surgeon: Francena Hanly, MD;  Location: [facility];  Service: Orthopedics;  Laterality: Left;    REVERSE  SHOULDER ARTHROPLASTY Right 07/20/2021   Procedure: REVERSE SHOULDER ARTHROPLASTY;  Surgeon: Francena Hanly, MD;  Location: [facility];  Service: Orthopedics;  Laterality: Right;   SHOULDER CLOSED REDUCTION Left 09/27/2019   Procedure: CLOSED REDUCTION SHOULDER;  Surgeon: Durene Romans, MD;  Location: [facility];  Service: Orthopedics;  Laterality: Left;   SPLENECTOMY  2006   TOTAL SHOULDER REVISION Left 11/12/2019   Procedure: Revision Left Reverse Shoulder Arthroplasty with poly exchange SDD;  Surgeon: Francena Hanly, MD;  Location: [facility];  Service: Orthopedics;  Laterality: Left;  -SDDC   TYMPANOSTOMY TUBE PLACEMENT     UPPER GASTROINTESTINAL ENDOSCOPY     Done at [facility]    Family History  Problem Relation Age of Onset   Prostate cancer Father    Colon cancer Father    Colon cancer Sister    Lung cancer Sister    Healthy Son    Alcohol abuse Brother    Allergic rhinitis Neg Hx    Asthma Neg Hx    Eczema Neg Hx    Urticaria Neg Hx     Social History   Social History Narrative   Patient lives in a two story home with her husband. She has an adult son. She is retired from being an Environmental health practitioner for 30 years.      Review of Systems General: Denies fevers or chills Skin: Denies any drainage or wounds  Physical Exam    07/18/2023    2:20 PM 07/04/2023    9:54 AM 07/04/2023    8:30 AM  Vitals with BMI  Height 5\' 4"  5\' 4"  5\' 4"   Weight 139 lbs 10 oz 141 lbs 140 lbs  BMI 23.95 24.19 24.02  Systolic 133 103 --  Diastolic 67 67 --  Pulse 62 69     General:  No acute distress, nontoxic appearing  Respiratory: No increased work of breathing Neuro: Alert and oriented Psychiatric: Normal mood and affect  Skin: 1.25 x 1 cm area of depression, approximately  0.2 cm depth.  Fully epithelialized.  Surrounding skin and tissue appears healthy.  Assessment/Plan  Scalp wound subsequent to [facility] Mohs excision and radiation:  Remarkably, she has healed nicely.  While patient feels  as though there has been some improvement since last encounter, no considerable change in depth of depression.  However, she is pleased.  She had exposed skull prior to surgical intervention 05/13/2023.  Now she has overlying tissue and epithelialization, no persistent wounds.  At this point, reasonable for her to follow-up only as needed.  She can call the clinic should she have any questions or concerns.  Picture(s) obtained of the patient and placed in the chart were with the patient's or guardian's permission.   Evelena Leyden PA-C 08/12/2023, 3:22 PM

## 2023-08-13 ENCOUNTER — Ambulatory Visit: Payer: Medicare Other | Admitting: Physician Assistant

## 2023-08-13 VITALS — BP 135/85 | HR 71

## 2023-08-13 DIAGNOSIS — L570 Actinic keratosis: Secondary | ICD-10-CM | POA: Diagnosis not present

## 2023-08-13 DIAGNOSIS — L578 Other skin changes due to chronic exposure to nonionizing radiation: Secondary | ICD-10-CM | POA: Diagnosis not present

## 2023-08-13 DIAGNOSIS — S0100XD Unspecified open wound of scalp, subsequent encounter: Secondary | ICD-10-CM

## 2023-08-13 DIAGNOSIS — M952 Other acquired deformity of head: Secondary | ICD-10-CM

## 2023-08-15 ENCOUNTER — Ambulatory Visit
Admission: RE | Admit: 2023-08-15 | Discharge: 2023-08-15 | Disposition: A | Discharge status: Home / Self Care | Payer: Medicare Other | Source: Ambulatory Visit | Attending: Student | Admitting: Student

## 2023-08-15 DIAGNOSIS — Z944 Liver transplant status: Secondary | ICD-10-CM

## 2023-08-15 DIAGNOSIS — T888XXA Other specified complications of surgical and medical care, not elsewhere classified, initial encounter: Secondary | ICD-10-CM | POA: Diagnosis not present

## 2023-08-15 DIAGNOSIS — T81321A Disruption or dehiscence of closure of internal operation (surgical) wound of abdominal wall muscle or fascia, initial encounter: Secondary | ICD-10-CM

## 2023-08-15 MED ORDER — IOPAMIDOL (ISOVUE-300) INJECTION 61%
200.0000 mL | Freq: Once | INTRAVENOUS | Status: AC | PRN
  Administered 2023-08-15: 80 mL via INTRAVENOUS

## 2023-08-19 DIAGNOSIS — D849 Immunodeficiency, unspecified: Secondary | ICD-10-CM | POA: Diagnosis not present

## 2023-08-19 DIAGNOSIS — Z944 Liver transplant status: Secondary | ICD-10-CM | POA: Diagnosis not present

## 2023-08-22 ENCOUNTER — Ambulatory Visit (INDEPENDENT_AMBULATORY_CARE_PROVIDER_SITE_OTHER): Payer: Medicare Other | Admitting: Gastroenterology

## 2023-08-22 ENCOUNTER — Encounter (INDEPENDENT_AMBULATORY_CARE_PROVIDER_SITE_OTHER): Payer: Self-pay | Admitting: Gastroenterology

## 2023-08-22 VITALS — BP 149/79 | HR 73 | Temp 97.8°F | Ht 64.0 in | Wt 142.9 lb

## 2023-08-22 DIAGNOSIS — Z944 Liver transplant status: Secondary | ICD-10-CM | POA: Diagnosis not present

## 2023-08-22 DIAGNOSIS — Z1509 Genetic susceptibility to other malignant neoplasm: Secondary | ICD-10-CM | POA: Diagnosis not present

## 2023-08-22 DIAGNOSIS — R0602 Shortness of breath: Secondary | ICD-10-CM | POA: Diagnosis not present

## 2023-08-22 DIAGNOSIS — K31819 Angiodysplasia of stomach and duodenum without bleeding: Secondary | ICD-10-CM | POA: Diagnosis not present

## 2023-08-22 DIAGNOSIS — K743 Primary biliary cirrhosis: Secondary | ICD-10-CM

## 2023-08-22 DIAGNOSIS — Z20822 Contact with and (suspected) exposure to covid-19: Secondary | ICD-10-CM | POA: Diagnosis not present

## 2023-08-22 DIAGNOSIS — R509 Fever, unspecified: Secondary | ICD-10-CM | POA: Diagnosis not present

## 2023-08-22 DIAGNOSIS — R5383 Other fatigue: Secondary | ICD-10-CM | POA: Diagnosis not present

## 2023-08-22 NOTE — Progress Notes (Signed)
Cassidy Barber, M.D. Gastroenterology & Hepatology [facility] Gastroenterology [address] [place], [place] [address]  Primary Care Physician: Carver Fila, PA-C [address] Medicine [place] [address]  I will communicate my assessment and recommendations to the referring MD via EMR.  Problems: GAVE PBC cirrhosis status post liver transplant Lynch syndrome - MLH1 mutation   History of Present Illness: Cassidy Barber is a 72 y.o. female with past medical history of PBC status post liver transplant, GAVE complicated by iron deficiency anemia, Lynch syndrome s/p proctocolectomy, hypertension, hypothyroidism, history of colon cancer, multiple skin cancers, who presents for follow up of Lynch syndrome, GAVE and PBC.  The patient was last seen on 11/26/2022.  She was advised to increase her URSO to 900 mg daily.  Patient underwent an EGD and a flexible sigmoidoscopy on 02/26/2023.  Flexible sigmoidoscopy showed patent end-to-side ileocolonic anastomosis at 18 cm.  Internal hemorrhoids.  EGD showed 2 cm hiatal hernia.  Had GAVE in the gastric antrum that was ablated.  Patient was recommended to have repeat EGD and flexible sigmoidoscopy in 1 year.  Since the last time she was seen in clinic, she had her transplant care switched to Dr. Laurine Blazer [facility].  She was seen by his doctor on 12/21/2022.  She was recommended to continue on everolimus 2 mg twice a day.  She was advised to follow-up in 1 year with their office. Patient reports she was stiwched to tacrolimus as she develoepd a skin lesion that need to be removed surgically and she had to be switched to other agent to allow proper wound healing.  Most recent labs from 08/19/2023 showed a CBC with WBC 6.9, hemoglobin 14.3, platelets 238, CMP with BUN 28, creatinine 0.9, alkaline phosphatase 243, AST 31, ALT 19, total bilirubin 0.3, albumin 3.8, potassium was 5.1 and sodium 137.  States that for the last 2 months  she has had drainage from a wound in the incision area of he transplant surgical area.  She reports that she has had some discharge from this area. She was seen by her PCP Jannifer Rodney who prescribed Bactrim for empiric coverage for possible infection. Due to this she was ordered a MRCP by the [facility] liver transplant team, which she had performed on 07/01/2023, which showed a Brode ventral hernia status post mesh repair, liver transplant was not found to have any abnormalities and bile ducts were measuring 0.8 cm.  As there was concern about a stitch abscess at the area of mesh, she was referred to [place] [facility], who has ordered a CT of the abdomen and pelvis with IV contrast.  Subsequent CT was  performed on 07/07/2023 but read is still pending.  She is supposed to follow-up with him regarding this.  The patient denies having any nausea, vomiting, fever, chills, hematochezia, melena, hematemesis, abdominal distention, abdominal pain, diarrhea, jaundice, pruritus or weight loss.  Last EGD: As above Last Colonoscopy: As above  Past Medical History: Past Medical History:  Diagnosis Date   Abdominal wall hernia    Incarcerated status post surgical repair 2019 - [facility]   Anemia of chronic disease    Atypical nevus 01/21/2018   atypical neoplasm- Left scalp-ant (txpbx + MOHS), atypical neoplasm- Left scalp post- (txpbx + MOHS)   Basal cell carcinoma    Colon cancer (HCC)    Colon surgery 2005 and 2012   History of pulmonary hypertension    Pre liver transplant   Hypertension  Hypothyroidism    Lynch syndrome    Osteopenia    Primary biliary cirrhosis (HCC)    Status post liver transplantation - follows at [facility]   SCCA (squamous cell carcinoma) of skin 02/23/2020   Right Upper Chest(moderate) (MOH's)   SCCA (squamous cell carcinoma) of skin 04/07/2020   Left Top Leg (Keratoacanthoma) treatment after biopsy   SCCA (squamous cell carcinoma) of skin 04/07/2020   Left Foot Dorsal (in situ)  treatment after biopsy   SCCA (squamous cell carcinoma) of skin 03/27/2021   Right Upper Back (Keratoacanthoma) (excision) (clear)   SCCA (squamous cell carcinoma) of skin 06/13/2021   Right Shoulder - anterior (moderately differentiated) (tx p bx)   SCCA (squamous cell carcinoma) of skin 06/13/2021   Right Thigh - anterior (well differentiated) (tx p bx)   SCCA (squamous cell carcinoma) of skin 06/13/2021   Right Lower Leg - anterior (well differentiated) (tx p bx)   Squamous cell carcinoma of skin 04/22/2018   KA-Right mid chest (txpbx), KA-left elbow crease (txpbx), insitu-Right mid chest inf. (exc)   Squamous cell carcinoma of skin 05/20/2018   well diff-Left upper shin (txpbx), well diff-Right lower forearm (txpbx), well diff-Right upper shin (txpbx)   Squamous cell carcinoma of skin 06/11/2018   Scc + margin-Right mid chest inferior    Squamous cell carcinoma of skin 06/27/2018   well diff-Left mid thigh(txpbx), well diff-Left inner thigh (txpbx),insitu-Right cheek (txpbx),well diff-right inner heel (txpbx)   Squamous cell carcinoma of skin 09/16/2018   well diff-left shoulder (txpbx), well diff-Right chin (txpbx), well diff-right chest lateral (CX35FU)   Squamous cell carcinoma of skin 10/01/2018   Right outer lower shin (Txpbx)   Squamous cell carcinoma of skin 04/03/2019   well diff-Right center chest (MOHS), in situ-Right ear   Squamous cell carcinoma of skin 08/05/2019   in situ-Left calf (txpbx), in situ-left bicep (txpbx), well diff-Left chest,inf(txpbx), in situ-Right chest inf-(txpbx)   Squamous cell carcinoma of skin 11/19/2019   KA-left top leg (txpbx), modify-Riight forehead-(mohs), in situ-right hand (txpbx), in situ-Right forearm (txpbx), well diff-Right chest (txpbx), well diff-chin (txpbx)   Squamous cell carcinoma of skin 01/06/2020   KA- Left top leg   Squamous cell carcinoma of skin 05/12/2013   bowens-middle of chest (CX35FU)   Squamous cell carcinoma of skin  05/18/2015   well diff-Left upper arm (CX35FU + Exc),KA-right chest(txpbx), in situ-Left shin (txpbx), well diff-Right cheek (CX35FU), KA-Left post scalp (CX35FU)   Squamous cell carcinoma of skin 08/09/2015   KA-Left post scalp ((MOHS), in situ- mid chest (Txpbx +exc), in situ-Left upper arm inferior (txpbx)   Squamous cell carcinoma of skin 10/13/2015   Left upper arm-clear   Squamous cell carcinoma of skin 03/09/2016   mod diff-mid chest (txpbx+ exc), mod diff-Right chest (txpbx+exc), well diff-right cheek-(txpbx),well diff-Left hand-(txpbx), in situ-Left upper arm (txpbx), well diff-Right cheek -(txpbx), well diff-Right crease arm (txpbx)    Squamous cell carcinoma of skin 05/24/2016   well diff-Right nasal crease-(MOHS)   Squamous cell carcinoma of skin 08/02/2016   KA-Left chest med (txpbx)   Squamous cell carcinoma of skin 08/30/2016   in situ-Left outer zygoma (txpbx)   Squamous cell carcinoma of skin 12/06/2016   well diff-Left chest sup, Left shoudler, insitu- right post scalp   Squamous cell carcinoma of skin 02/14/2017   well diff-Left forearm (EXC),in situ-RIght ant neck   Squamous cell carcinoma of skin 06/14/2017   in situ-Right forearm (txpbx), in situ-Right chest (txpbx), well diff-left chest (txpbx),  well diff-anterior neck- (txpbx)   Squamous cell carcinoma of skin 08/07/2017   well diff-Left upper shoulder (txpbx), sup and invasive-Left temple (txpbx), well diff-Right upper shin (txpbx), in situ-Right clavicle (txpbx)   Squamous cell carcinoma of skin 10/17/2017   well diff-ant. neck (MOHS), in situ-Right chest, inf (txpbx)   Squamous cell carcinoma of skin 01/21/2018   well diff- Right chest,ulnar (txpbx), well diff- right upper chest (txpbx), in situ-Right ant. crown (txpbx)   Squamous cell carcinoma of skin 08/02/2020   well diff-left lower leg-inferior (Txpbx)   Squamous cell carcinoma of skin 08/02/2020   well diff-right lower leg-mid (txpbx)   Squamous cell  carcinoma of skin 08/02/2020   well diff-left chest upper   Squamous cell carcinoma of skin 08/02/2020   well diff-mid parietal scalp (MOHS)   Squamous cell carcinoma of skin 08/02/2020   well diff-right foot inner(txpbx)   Squamous cell carcinoma of skin 08/02/2020   well diff- left lower leg medial (txpbx)   Squamous cell carcinoma of skin 08/02/2020   well diff-left lower leg anterior (txpbx)   Squamous cell carcinoma of skin 08/02/2020   well diff-left lower leg medial (txpbx)   Squamous cell carcinoma of skin 08/02/2020   well diff-right forearm-posterior (txpbx)    Past Surgical History: Past Surgical History:  Procedure Laterality Date   ABDOMINAL HERNIA REPAIR     Patient's states that she has had 8- 9 hernia surgeries   ABDOMINAL HYSTERECTOMY     BIOPSY  02/15/2021   Procedure: BIOPSY;  Surgeon: Malissa Hippo, MD;  [facility];  Service: Endoscopy;;   BIOPSY  12/20/2021   Procedure: BIOPSY;  Surgeon: Malissa Hippo, MD;  [facility];  Service: Endoscopy;;   CATARACT EXTRACTION W/PHACO Right 01/15/2020   Procedure: CATARACT EXTRACTION PHACO AND INTRAOCULAR LENS PLACEMENT (IOC);  Surgeon: Fabio Pierce, MD;  Location: [facility];  Service: Ophthalmology;  Laterality: Right;  CDE: 7.89   CATARACT EXTRACTION W/PHACO Left 01/29/2020   Procedure: CATARACT EXTRACTION PHACO AND INTRAOCULAR LENS PLACEMENT (IOC) (CDE: 6.33);  Surgeon: Fabio Pierce, MD;  Location: [facility];  Service: Ophthalmology;  Laterality: Left;   CHOLECYSTECTOMY  2007   COLON SURGERY  2008   Done at [facility]   COLONOSCOPY     Done at [facility]   ESOPHAGOGASTRODUODENOSCOPY N/A 08/21/2018   Procedure: ESOPHAGOGASTRODUODENOSCOPY (EGD);  Surgeon: Malissa Hippo, MD;  [facility];  Service: Endoscopy;  Laterality: N/A;   ESOPHAGOGASTRODUODENOSCOPY (EGD) WITH PROPOFOL N/A 12/16/2019   Procedure: ESOPHAGOGASTRODUODENOSCOPY (EGD) WITH PROPOFOL;  Surgeon: Malissa Hippo, MD;  [facility];  Service: Endoscopy;  Laterality: N/A;   ESOPHAGOGASTRODUODENOSCOPY (EGD) WITH PROPOFOL N/A 12/20/2021   Procedure: ESOPHAGOGASTRODUODENOSCOPY (EGD) WITH PROPOFOL;  Surgeon: Malissa Hippo, MD;  [facility];  Service: Endoscopy;  Laterality: N/A;  9:05   ESOPHAGOGASTRODUODENOSCOPY (EGD) WITH PROPOFOL N/A 02/14/2022   Procedure: ESOPHAGOGASTRODUODENOSCOPY (EGD) WITH PROPOFOL;  Surgeon: Malissa Hippo, MD;  [facility];  Service: Endoscopy;  Laterality: N/A;  730   ESOPHAGOGASTRODUODENOSCOPY (EGD) WITH PROPOFOL N/A 06/04/2022   Procedure: ESOPHAGOGASTRODUODENOSCOPY (EGD) WITH PROPOFOL;  Surgeon: Dolores Frame, MD;  [facility];  Service: Gastroenterology;  Laterality: N/A;  145   ESOPHAGOGASTRODUODENOSCOPY (EGD) WITH PROPOFOL N/A 02/26/2023   Procedure: ESOPHAGOGASTRODUODENOSCOPY (EGD) WITH PROPOFOL;  Surgeon: Dolores Frame, MD;  [facility];  Service: Gastroenterology;  Laterality: N/A;  9:15AM;ASA 3   EYE SURGERY  lasix   FLEXIBLE SIGMOIDOSCOPY N/A 10/20/2015   Procedure: FLEXIBLE SIGMOIDOSCOPY;  Surgeon: Malissa Hippo, MD;  [facility];  Service: Endoscopy;  Laterality: N/A;  67 - Dr Karilyn Cota has meeting until 1:00   FLEXIBLE SIGMOIDOSCOPY N/A 07/11/2016   Procedure: FLEXIBLE SIGMOIDOSCOPY;  Surgeon: Malissa Hippo, MD;  [facility];  Service: Endoscopy;  Laterality: N/A;  1200   FLEXIBLE SIGMOIDOSCOPY N/A 08/09/2017   Procedure: FLEXIBLE SIGMOIDOSCOPY;  Surgeon: Malissa Hippo, MD;  [facility];  Service: Endoscopy;  Laterality: N/A;  1:00   FLEXIBLE SIGMOIDOSCOPY N/A 08/21/2018   Procedure: FLEXIBLE SIGMOIDOSCOPY;  Surgeon: Malissa Hippo, MD;  [facility];  Service: Endoscopy;  Laterality: N/A;   FLEXIBLE SIGMOIDOSCOPY N/A 12/16/2019   Procedure: FLEXIBLE SIGMOIDOSCOPY wirh Propofol;  Surgeon: Malissa Hippo, MD;  [facility];  Service:  Endoscopy;  Laterality: N/A;  7:30   FLEXIBLE SIGMOIDOSCOPY N/A 02/15/2021   Procedure: FLEXIBLE SIGMOIDOSCOPY WITH PROPOFOL;  Surgeon: Malissa Hippo, MD;  [facility];  Service: Endoscopy;  Laterality: N/A;  am   FLEXIBLE SIGMOIDOSCOPY N/A 12/20/2021   Procedure: FLEXIBLE SIGMOIDOSCOPY;  Surgeon: Malissa Hippo, MD;  [facility];  Service: Endoscopy;  Laterality: N/A;   FLEXIBLE SIGMOIDOSCOPY N/A 02/26/2023   Procedure: FLEXIBLE SIGMOIDOSCOPY;  Surgeon: Dolores Frame, MD;  [facility];  Service: Gastroenterology;  Laterality: N/A;  9:15AM; ASA 3   FRACTURE SURGERY     right wrist metal plate   GI RADIOFREQUENCY ABLATION N/A 02/14/2022   Procedure: GI RADIOFREQUENCY ABLATION;  Surgeon: Malissa Hippo, MD;  [facility];  Service: Endoscopy;  Laterality: N/A;   HOT HEMOSTASIS N/A 12/20/2021   Procedure: HOT HEMOSTASIS (ARGON PLASMA COAGULATION/BICAP);  Surgeon: Malissa Hippo, MD;  [facility];  Service: Endoscopy;  Laterality: N/A;   HOT HEMOSTASIS  06/04/2022   Procedure: HOT HEMOSTASIS (ARGON PLASMA COAGULATION/BICAP);  Surgeon: Marguerita Merles, Reuel Boom, MD;  [facility];  Service: Gastroenterology;;   HOT HEMOSTASIS  02/26/2023   Procedure: HOT HEMOSTASIS (ARGON PLASMA COAGULATION/BICAP);  Surgeon: Marguerita Merles, Reuel Boom, MD;  [facility];  Service: Gastroenterology;;   IRRIGATION AND DEBRIDEMENT OF WOUND WITH SPLIT THICKNESS SKIN GRAFT N/A 05/13/2023   Procedure: Excision of scalp wound with Myriad or Acell placement;  Surgeon: Peggye Form, DO;  Location: [facility];  Service: Plastics;  Laterality: N/A;   LIVER TRANSPLANT  11/19/2013   POLYPECTOMY  08/09/2017   Procedure: POLYPECTOMY;  Surgeon: Malissa Hippo, MD;  [facility];  Service: Endoscopy;;  colon small bowel   POLYPECTOMY N/A 02/14/2022   Procedure: POLYPECTOMY;  Surgeon: Malissa Hippo, MD;   [facility];  Service: Endoscopy;  Laterality: N/A;   REVERSE SHOULDER ARTHROPLASTY Left 07/17/2018   Procedure: LEFT REVERSE SHOULDER ARTHROPLASTY;  Surgeon: Francena Hanly, MD;  [facility]: [facility];  Service: Orthopedics;  Laterality: Left;    REVERSE SHOULDER ARTHROPLASTY Right 07/20/2021   Procedure: REVERSE SHOULDER ARTHROPLASTY;  Surgeon: Francena Hanly, MD;  Location: [facility];  Service: Orthopedics;  Laterality: Right;   SHOULDER CLOSED REDUCTION Left 09/27/2019   Procedure: CLOSED REDUCTION SHOULDER;  Surgeon: Durene Romans, MD;  Location: [facility];  Service: Orthopedics;  Laterality: Left;   SPLENECTOMY  2006   TOTAL SHOULDER REVISION Left 11/12/2019   Procedure: Revision Left Reverse Shoulder Arthroplasty with poly exchange SDD;  Surgeon: Francena Hanly, MD;  Location: [facility];  Service: Orthopedics;  Laterality: Left;  -SDDC   TYMPANOSTOMY TUBE PLACEMENT     UPPER GASTROINTESTINAL ENDOSCOPY     Done at [facility]    Family History: Family History  Problem Relation Age of Onset   Prostate cancer Father    Colon cancer Father    Colon cancer Sister    Lung cancer Sister    Healthy Son    Alcohol abuse Brother    Allergic rhinitis Neg Hx    Asthma Neg Hx    Eczema Neg Hx    Urticaria Neg Hx     Social History: Social History   Tobacco Use  Smoking Status Never  Smokeless Tobacco Never   Social History   Substance and Sexual Activity  Alcohol Use No   Alcohol/week: 0.0 standard drinks of alcohol   Social History   Substance and Sexual Activity  Drug Use No    Allergies: Allergies  Allergen Reactions   Ciprofloxacin Itching   Codeine Nausea Only    Medications: Current Outpatient Medications  Medication Sig Dispense Refill   acetaminophen (TYLENOL) 500 MG tablet Take 1,000 mg by mouth every 6 (six) hours as needed (for pain.).      alendronate (FOSAMAX) 70 MG tablet TAKE 1 TABLET EVERY WEEK 12 tablet 2   ALPRAZolam (XANAX) 0.5 MG tablet Take 1  tablet (0.5 mg total) by mouth at bedtime. 90 tablet 1   Biotin w/ Vitamins C & E (HAIR SKIN & NAILS GUMMIES PO) Take 2 tablets by mouth daily.     Calcium Carb-Cholecalciferol (CALCIUM 600 + D PO) Take 1 tablet by mouth 2 (two) times daily.     cetirizine (ZYRTEC) 10 MG tablet Take 1 tablet (10 mg total) by mouth daily as needed for allergies. 90 tablet 3   Cholecalciferol (VITAMIN D) 50 MCG (2000 UT) tablet Take 2,000 Units by mouth daily.     Clobetasol Prop Emollient Base (CLOBETASOL PROPIONATE E) 0.05 % emollient cream Apply 1 application. topically 2 (two) times daily. (Patient taking differently: Apply 1 application  topically 2 (two) times daily as needed (rash).) 180 g 1   clotrimazole-betamethasone (LOTRISONE) cream Apply 1 application topically 2 (two) times daily. (Patient taking differently: Apply 1 application  topically daily as needed (Rash).) 30 g 0   famotidine (PEPCID) 20 MG tablet Take 1 tablet (20 mg total) by mouth 2 (two) times daily. 60 tablet 5   fluorouracil (EFUDEX) 5 % cream Apply 1 application topically daily as needed (cancer spots).   0   fluticasone (FLONASE) 50 MCG/ACT nasal spray Place 2 sprays into both nostrils daily. 16 g 6   gabapentin (NEURONTIN) 100 MG capsule Take 1 capsule (100 mg total) by mouth daily. 90 capsule 0   hydrocortisone cream 1 % Apply 1 Application topically 2 (two) times daily as needed for itching.     ketoconazole (NIZORAL) 2 % cream Apply 1 Application topically 2 (two) times daily. 15 g 0   levothyroxine (SYNTHROID) 112 MCG tablet TAKE ONE (1) TABLET BY MOUTH EVERY DAY 90 tablet 2   losartan (COZAAR) 25 MG tablet Take 1 tablet (25 mg total) by mouth daily. 90 tablet 4   metoprolol succinate (TOPROL-XL) 25 MG 24 hr tablet Take 1 tablet (25 mg total) by mouth daily. 90 tablet 1   Multiple Vitamins-Minerals (MULTIVITAMIN WITH MINERALS) tablet Take 1 tablet by mouth daily.     mupirocin ointment (BACTROBAN) 2 % Apply 1 application topically  daily as needed (Cancer spots). 22 g 3   ondansetron (ZOFRAN) 4 MG tablet Take 1 tablet (4 mg total) by mouth every 8 (eight) hours as needed for nausea or vomiting. 90 tablet 1   pantoprazole (PROTONIX) 40 MG tablet Take 1 tablet (40 mg total) by mouth daily. 90 tablet 1   Probiotic Product (PROBIOTIC PO) Take 1 capsule by mouth daily.     silver sulfADIAZINE (SILVADENE) 1 % cream Apply 1 Application topically daily. Apply to large surface area once to twice daily (Patient taking differently: Apply 1 Application topically daily as needed (skin irritation). Apply to large surface area once to twice daily) 400 g 1   tacrolimus (PROGRAF) 0.5 MG capsule Take 0.5 mg by mouth. 2 capsules in am and 3 capsules at night.     ursodiol (ACTIGALL) 300 MG capsule TAKE 3 CAPSULES BY MOUTH DAILY 90 capsule 5   vitamin C (ASCORBIC ACID) 250 MG tablet Take 250 mg by mouth daily.     Vitamin D, Ergocalciferol, (DRISDOL) 1.25 MG (50000 UNIT) CAPS capsule TAKE 1 CAPSULE BY MOUTH ONCE A WEEK 12 capsule 1   vitamin E 180 MG (400 UNITS) capsule Take 400 Units by mouth daily.     sirolimus (RAPAMUNE) 1 MG tablet Take 1 mg by mouth 2 (two) times daily. (Patient not taking: Reported on 08/22/2023)     No current facility-administered medications for this visit.    Review of Systems: GENERAL: negative for malaise, night sweats HEENT: No changes in hearing or vision, no nose bleeds or other nasal problems. NECK: Negative for lumps, goiter, pain and significant neck swelling RESPIRATORY: Negative for cough, wheezing CARDIOVASCULAR: Negative for chest pain, leg swelling, palpitations, orthopnea GI: SEE HPI MUSCULOSKELETAL: Negative for joint pain or swelling, back pain, and muscle pain. SKIN: Negative for lesions, rash PSYCH: Negative for sleep disturbance, mood disorder and recent psychosocial stressors. HEMATOLOGY Negative for prolonged bleeding, bruising easily, and swollen nodes. ENDOCRINE: Negative for cold or  heat intolerance, polyuria, polydipsia and goiter. NEURO: negative for tremor, gait imbalance, syncope and seizures. The remainder of the review of systems is noncontributory.   Physical Exam: BP (!) 149/79 (BP Location: Left Arm, Patient Position: Sitting, Cuff Size: Normal)   Pulse 73   Temp 97.8 F (36.6 C) (Temporal)   Ht 5\' 4"  (1.626 m)   Wt 142 lb 14.4 oz (64.8 kg)   BMI 24.53 kg/m  GENERAL: The patient is AO x3, in no acute distress. HEENT: Head is normocephalic and atraumatic. EOMI are intact. Mouth is well hydrated and without lesions. NECK: Supple. No masses LUNGS: Clear to auscultation. No presence of rhonchi/wheezing/rales. Adequate chest expansion HEART: RRR, normal s1 and s2. ABDOMEN: Soft, nontender, no guarding, no peritoneal signs, and nondistended. BS +. No masses.Has a small rounded excavated lesion in supraumbilical area without active drainage but with yellow contents. EXTREMITIES: Without any cyanosis, clubbing, rash, lesions or edema. NEUROLOGIC: AOx3, no focal motor deficit. SKIN: no jaundice, no rashes  Imaging/Labs: as above  I personally reviewed and interpreted the available labs, imaging and endoscopic files.  Impression and Plan: Cassidy Barber is a 72 y.o. female with past medical history of PBC status post liver transplant, GAVE complicated by iron deficiency anemia, Lynch syndrome s/p proctocolectomy, hypertension, hypothyroidism, history of colon cancer, multiple skin cancers, who presents for follow up of Lynch syndrome, GAVE and PBC.  Patient has a complex medical history of PBC that required liver transplant at [facility], currently being followed by  [facility] transplant hepatology.  She has been followed by transplant hepatology since then.  She has been doing relatively well and was transitioned to tacrolimus for immunosuppression.  Notably, she has been found to have fluctuation in her alkaline phosphatase for last few years.  Even though she is on  therapeutic dose of URSO, her alkaline phosphatase has remained above upper limit of normal.  I will try to reach transplant hepatology team to assess the possibility of starting Ocaliva 5 mg qday for management of possible recurrent PBC.  She will continue on same dose of ursodiol.  In terms of her Lynch syndrome, she will be due for repeat EGD and colonoscopy in a year for surveillance of malignancy risk.  She does not have a mutation that we will increase the risk of urinary cancer.  If she has any gynecologic symptoms, she should follow urgently with gynecology due to risk of malignancy.  She is currently following with dermatology as she has had a history of skin cancers.  Finally, regarding the drainage from her abdominal wall, she should follow closely with the surgeon at [facility] once the CT scan results are back.  - Continue ursodiol 900 mg every day - We will reach the transplant team at [facility] discussed the possibility of starting Ocaliva 5 mg for PBC - Schedule repeat EGD and colonoscopy in April 2025 - Follow with dermatology for skin lesions - Follow-up with general surgeon at [facility] regarding abdominal wall lesion and drainage, as well as CT scan results  All questions were answered.      Cassidy Blazing, MD Gastroenterology and Hepatology [facility]

## 2023-08-22 NOTE — Patient Instructions (Addendum)
Continue ursodiol 900 mg every day We will reach the transplant team at [facility] discussed the possibility of starting Ocaliva for [facility] Schedule repeat EGD and colonoscopy in April 2025 Follow-up with general surgeon at [facility] regarding abdominal wall lesion and drainage, as well as CT scan results

## 2023-08-26 DIAGNOSIS — R509 Fever, unspecified: Secondary | ICD-10-CM | POA: Diagnosis not present

## 2023-08-26 DIAGNOSIS — Z20822 Contact with and (suspected) exposure to covid-19: Secondary | ICD-10-CM | POA: Diagnosis not present

## 2023-08-26 DIAGNOSIS — R0602 Shortness of breath: Secondary | ICD-10-CM | POA: Diagnosis not present

## 2023-08-26 DIAGNOSIS — Z944 Liver transplant status: Secondary | ICD-10-CM | POA: Diagnosis not present

## 2023-08-26 DIAGNOSIS — R5383 Other fatigue: Secondary | ICD-10-CM | POA: Diagnosis not present

## 2023-08-26 DIAGNOSIS — Z0389 Encounter for observation for other suspected diseases and conditions ruled out: Secondary | ICD-10-CM | POA: Diagnosis not present

## 2023-08-29 DIAGNOSIS — M47816 Spondylosis without myelopathy or radiculopathy, lumbar region: Secondary | ICD-10-CM | POA: Diagnosis not present

## 2023-08-29 DIAGNOSIS — R509 Fever, unspecified: Secondary | ICD-10-CM | POA: Diagnosis not present

## 2023-08-29 DIAGNOSIS — M545 Low back pain, unspecified: Secondary | ICD-10-CM | POA: Diagnosis not present

## 2023-08-29 DIAGNOSIS — Z20822 Contact with and (suspected) exposure to covid-19: Secondary | ICD-10-CM | POA: Diagnosis not present

## 2023-08-29 DIAGNOSIS — R5383 Other fatigue: Secondary | ICD-10-CM | POA: Diagnosis not present

## 2023-08-29 DIAGNOSIS — R0602 Shortness of breath: Secondary | ICD-10-CM | POA: Diagnosis not present

## 2023-09-02 DIAGNOSIS — R5383 Other fatigue: Secondary | ICD-10-CM | POA: Diagnosis not present

## 2023-09-02 DIAGNOSIS — R0602 Shortness of breath: Secondary | ICD-10-CM | POA: Diagnosis not present

## 2023-09-02 DIAGNOSIS — R509 Fever, unspecified: Secondary | ICD-10-CM | POA: Diagnosis not present

## 2023-09-02 DIAGNOSIS — Z20822 Contact with and (suspected) exposure to covid-19: Secondary | ICD-10-CM | POA: Diagnosis not present

## 2023-09-05 DIAGNOSIS — M47896 Other spondylosis, lumbar region: Secondary | ICD-10-CM | POA: Diagnosis not present

## 2023-09-06 DIAGNOSIS — D849 Immunodeficiency, unspecified: Secondary | ICD-10-CM | POA: Diagnosis not present

## 2023-09-06 DIAGNOSIS — Z944 Liver transplant status: Secondary | ICD-10-CM | POA: Diagnosis not present

## 2023-09-07 DIAGNOSIS — R5383 Other fatigue: Secondary | ICD-10-CM | POA: Diagnosis not present

## 2023-09-07 DIAGNOSIS — R0602 Shortness of breath: Secondary | ICD-10-CM | POA: Diagnosis not present

## 2023-09-07 DIAGNOSIS — R509 Fever, unspecified: Secondary | ICD-10-CM | POA: Diagnosis not present

## 2023-09-07 DIAGNOSIS — Z20822 Contact with and (suspected) exposure to covid-19: Secondary | ICD-10-CM | POA: Diagnosis not present

## 2023-09-08 DIAGNOSIS — M47816 Spondylosis without myelopathy or radiculopathy, lumbar region: Secondary | ICD-10-CM | POA: Diagnosis not present

## 2023-09-11 DIAGNOSIS — R5383 Other fatigue: Secondary | ICD-10-CM | POA: Diagnosis not present

## 2023-09-11 DIAGNOSIS — R509 Fever, unspecified: Secondary | ICD-10-CM | POA: Diagnosis not present

## 2023-09-11 DIAGNOSIS — Z20822 Contact with and (suspected) exposure to covid-19: Secondary | ICD-10-CM | POA: Diagnosis not present

## 2023-09-11 DIAGNOSIS — R0602 Shortness of breath: Secondary | ICD-10-CM | POA: Diagnosis not present

## 2023-09-15 DIAGNOSIS — R509 Fever, unspecified: Secondary | ICD-10-CM | POA: Diagnosis not present

## 2023-09-15 DIAGNOSIS — R5383 Other fatigue: Secondary | ICD-10-CM | POA: Diagnosis not present

## 2023-09-15 DIAGNOSIS — Z20822 Contact with and (suspected) exposure to covid-19: Secondary | ICD-10-CM | POA: Diagnosis not present

## 2023-09-15 DIAGNOSIS — R0602 Shortness of breath: Secondary | ICD-10-CM | POA: Diagnosis not present

## 2023-09-16 DIAGNOSIS — Z944 Liver transplant status: Secondary | ICD-10-CM | POA: Diagnosis not present

## 2023-09-16 DIAGNOSIS — D849 Immunodeficiency, unspecified: Secondary | ICD-10-CM | POA: Diagnosis not present

## 2023-09-17 DIAGNOSIS — M47816 Spondylosis without myelopathy or radiculopathy, lumbar region: Secondary | ICD-10-CM | POA: Diagnosis not present

## 2023-09-18 DIAGNOSIS — Z85828 Personal history of other malignant neoplasm of skin: Secondary | ICD-10-CM | POA: Diagnosis not present

## 2023-09-18 DIAGNOSIS — Z79621 Long term (current) use of calcineurin inhibitor: Secondary | ICD-10-CM | POA: Diagnosis not present

## 2023-09-18 DIAGNOSIS — R748 Abnormal levels of other serum enzymes: Secondary | ICD-10-CM | POA: Diagnosis not present

## 2023-09-18 DIAGNOSIS — Z79899 Other long term (current) drug therapy: Secondary | ICD-10-CM | POA: Diagnosis not present

## 2023-09-18 DIAGNOSIS — Z944 Liver transplant status: Secondary | ICD-10-CM | POA: Diagnosis not present

## 2023-09-18 DIAGNOSIS — L299 Pruritus, unspecified: Secondary | ICD-10-CM | POA: Diagnosis not present

## 2023-09-18 DIAGNOSIS — Z23 Encounter for immunization: Secondary | ICD-10-CM | POA: Diagnosis not present

## 2023-09-18 DIAGNOSIS — K831 Obstruction of bile duct: Secondary | ICD-10-CM | POA: Diagnosis not present

## 2023-09-18 DIAGNOSIS — R1011 Right upper quadrant pain: Secondary | ICD-10-CM | POA: Diagnosis not present

## 2023-09-18 DIAGNOSIS — Z5181 Encounter for therapeutic drug level monitoring: Secondary | ICD-10-CM | POA: Diagnosis not present

## 2023-09-18 DIAGNOSIS — T8649 Other complications of liver transplant: Secondary | ICD-10-CM | POA: Diagnosis not present

## 2023-09-18 DIAGNOSIS — Z1509 Genetic susceptibility to other malignant neoplasm: Secondary | ICD-10-CM | POA: Diagnosis not present

## 2023-09-18 DIAGNOSIS — Z4823 Encounter for aftercare following liver transplant: Secondary | ICD-10-CM | POA: Diagnosis not present

## 2023-09-18 DIAGNOSIS — D84821 Immunodeficiency due to drugs: Secondary | ICD-10-CM | POA: Diagnosis not present

## 2023-09-18 DIAGNOSIS — E875 Hyperkalemia: Secondary | ICD-10-CM | POA: Diagnosis not present

## 2023-09-18 DIAGNOSIS — Z9049 Acquired absence of other specified parts of digestive tract: Secondary | ICD-10-CM | POA: Diagnosis not present

## 2023-09-18 DIAGNOSIS — D849 Immunodeficiency, unspecified: Secondary | ICD-10-CM | POA: Diagnosis not present

## 2023-09-18 DIAGNOSIS — Z85038 Personal history of other malignant neoplasm of large intestine: Secondary | ICD-10-CM | POA: Diagnosis not present

## 2023-09-19 DIAGNOSIS — R0602 Shortness of breath: Secondary | ICD-10-CM | POA: Diagnosis not present

## 2023-09-19 DIAGNOSIS — R5383 Other fatigue: Secondary | ICD-10-CM | POA: Diagnosis not present

## 2023-09-19 DIAGNOSIS — R509 Fever, unspecified: Secondary | ICD-10-CM | POA: Diagnosis not present

## 2023-09-19 DIAGNOSIS — Z20822 Contact with and (suspected) exposure to covid-19: Secondary | ICD-10-CM | POA: Diagnosis not present

## 2023-09-23 DIAGNOSIS — Z944 Liver transplant status: Secondary | ICD-10-CM | POA: Diagnosis not present

## 2023-09-23 DIAGNOSIS — D849 Immunodeficiency, unspecified: Secondary | ICD-10-CM | POA: Diagnosis not present

## 2023-09-24 DIAGNOSIS — L814 Other melanin hyperpigmentation: Secondary | ICD-10-CM | POA: Diagnosis not present

## 2023-09-24 DIAGNOSIS — L570 Actinic keratosis: Secondary | ICD-10-CM | POA: Diagnosis not present

## 2023-09-24 DIAGNOSIS — D229 Melanocytic nevi, unspecified: Secondary | ICD-10-CM | POA: Diagnosis not present

## 2023-09-24 DIAGNOSIS — D485 Neoplasm of uncertain behavior of skin: Secondary | ICD-10-CM | POA: Diagnosis not present

## 2023-09-24 DIAGNOSIS — D3701 Neoplasm of uncertain behavior of lip: Secondary | ICD-10-CM | POA: Diagnosis not present

## 2023-09-24 DIAGNOSIS — S81801A Unspecified open wound, right lower leg, initial encounter: Secondary | ICD-10-CM | POA: Diagnosis not present

## 2023-09-24 DIAGNOSIS — L821 Other seborrheic keratosis: Secondary | ICD-10-CM | POA: Diagnosis not present

## 2023-09-25 ENCOUNTER — Telehealth: Payer: Self-pay | Admitting: Family Medicine

## 2023-09-25 DIAGNOSIS — E039 Hypothyroidism, unspecified: Secondary | ICD-10-CM | POA: Diagnosis not present

## 2023-09-25 DIAGNOSIS — K831 Obstruction of bile duct: Secondary | ICD-10-CM | POA: Diagnosis not present

## 2023-09-25 DIAGNOSIS — I159 Secondary hypertension, unspecified: Secondary | ICD-10-CM | POA: Diagnosis not present

## 2023-09-25 DIAGNOSIS — Z01818 Encounter for other preprocedural examination: Secondary | ICD-10-CM | POA: Diagnosis not present

## 2023-09-25 DIAGNOSIS — M5432 Sciatica, left side: Secondary | ICD-10-CM

## 2023-09-25 NOTE — Telephone Encounter (Signed)
Copied from CRM [id number]. Topic: Appointments - Appointment Scheduling >> Sep 25, 2023  9:39 AM Cassidy Barber H wrote: Patient/patient representative is calling to schedule an appointment. Refer to attachments for appointment information.

## 2023-09-25 NOTE — Telephone Encounter (Signed)
Pt needed appt for dexa scan gave information to [facility] to schedule and informed pt appt time will show in [place]

## 2023-09-27 DIAGNOSIS — I119 Hypertensive heart disease without heart failure: Secondary | ICD-10-CM | POA: Diagnosis not present

## 2023-09-27 DIAGNOSIS — Z538 Procedure and treatment not carried out for other reasons: Secondary | ICD-10-CM | POA: Diagnosis not present

## 2023-09-27 DIAGNOSIS — E875 Hyperkalemia: Secondary | ICD-10-CM | POA: Diagnosis not present

## 2023-09-27 DIAGNOSIS — R7989 Other specified abnormal findings of blood chemistry: Secondary | ICD-10-CM | POA: Diagnosis not present

## 2023-09-27 DIAGNOSIS — Z8719 Personal history of other diseases of the digestive system: Secondary | ICD-10-CM | POA: Diagnosis not present

## 2023-09-27 DIAGNOSIS — Z79890 Hormone replacement therapy (postmenopausal): Secondary | ICD-10-CM | POA: Diagnosis not present

## 2023-09-27 DIAGNOSIS — Z944 Liver transplant status: Secondary | ICD-10-CM | POA: Diagnosis not present

## 2023-09-27 DIAGNOSIS — D8489 Other immunodeficiencies: Secondary | ICD-10-CM | POA: Diagnosis not present

## 2023-09-27 DIAGNOSIS — E039 Hypothyroidism, unspecified: Secondary | ICD-10-CM | POA: Diagnosis not present

## 2023-09-27 DIAGNOSIS — Z79899 Other long term (current) drug therapy: Secondary | ICD-10-CM | POA: Diagnosis not present

## 2023-09-27 DIAGNOSIS — K831 Obstruction of bile duct: Secondary | ICD-10-CM | POA: Diagnosis not present

## 2023-09-27 MED ORDER — GABAPENTIN 100 MG PO CAPS
100.0000 mg | ORAL_CAPSULE | Freq: Three times a day (TID) | ORAL | 3 refills | Status: DC | PRN
Start: 2023-09-27 — End: 2024-05-08

## 2023-09-29 DIAGNOSIS — C44729 Squamous cell carcinoma of skin of left lower limb, including hip: Secondary | ICD-10-CM | POA: Diagnosis not present

## 2023-09-29 DIAGNOSIS — C44329 Squamous cell carcinoma of skin of other parts of face: Secondary | ICD-10-CM | POA: Diagnosis not present

## 2023-09-29 DIAGNOSIS — C4402 Squamous cell carcinoma of skin of lip: Secondary | ICD-10-CM | POA: Diagnosis not present

## 2023-09-29 DIAGNOSIS — C44622 Squamous cell carcinoma of skin of right upper limb, including shoulder: Secondary | ICD-10-CM | POA: Diagnosis not present

## 2023-09-30 DIAGNOSIS — Z944 Liver transplant status: Secondary | ICD-10-CM | POA: Diagnosis not present

## 2023-09-30 DIAGNOSIS — D849 Immunodeficiency, unspecified: Secondary | ICD-10-CM | POA: Diagnosis not present

## 2023-10-03 DIAGNOSIS — Z881 Allergy status to other antibiotic agents status: Secondary | ICD-10-CM | POA: Diagnosis not present

## 2023-10-03 DIAGNOSIS — G40909 Epilepsy, unspecified, not intractable, without status epilepticus: Secondary | ICD-10-CM | POA: Diagnosis not present

## 2023-10-03 DIAGNOSIS — Z940 Kidney transplant status: Secondary | ICD-10-CM | POA: Diagnosis not present

## 2023-10-03 DIAGNOSIS — I2729 Other secondary pulmonary hypertension: Secondary | ICD-10-CM | POA: Diagnosis not present

## 2023-10-03 DIAGNOSIS — I159 Secondary hypertension, unspecified: Secondary | ICD-10-CM | POA: Diagnosis not present

## 2023-10-03 DIAGNOSIS — T81321A Disruption or dehiscence of closure of internal operation (surgical) wound of abdominal wall muscle or fascia, initial encounter: Secondary | ICD-10-CM | POA: Diagnosis not present

## 2023-10-03 DIAGNOSIS — Y838 Other surgical procedures as the cause of abnormal reaction of the patient, or of later complication, without mention of misadventure at the time of the procedure: Secondary | ICD-10-CM | POA: Diagnosis not present

## 2023-10-03 DIAGNOSIS — Z9889 Other specified postprocedural states: Secondary | ICD-10-CM | POA: Diagnosis not present

## 2023-10-03 DIAGNOSIS — E039 Hypothyroidism, unspecified: Secondary | ICD-10-CM | POA: Diagnosis not present

## 2023-10-03 DIAGNOSIS — Z79621 Long term (current) use of calcineurin inhibitor: Secondary | ICD-10-CM | POA: Diagnosis not present

## 2023-10-03 DIAGNOSIS — Z944 Liver transplant status: Secondary | ICD-10-CM | POA: Diagnosis not present

## 2023-10-03 DIAGNOSIS — Z885 Allergy status to narcotic agent status: Secondary | ICD-10-CM | POA: Diagnosis not present

## 2023-10-03 DIAGNOSIS — T8189XA Other complications of procedures, not elsewhere classified, initial encounter: Secondary | ICD-10-CM | POA: Diagnosis not present

## 2023-10-03 DIAGNOSIS — I10 Essential (primary) hypertension: Secondary | ICD-10-CM | POA: Diagnosis not present

## 2023-10-04 DIAGNOSIS — M47816 Spondylosis without myelopathy or radiculopathy, lumbar region: Secondary | ICD-10-CM | POA: Diagnosis not present

## 2023-10-07 DIAGNOSIS — D849 Immunodeficiency, unspecified: Secondary | ICD-10-CM | POA: Diagnosis not present

## 2023-10-07 DIAGNOSIS — Z944 Liver transplant status: Secondary | ICD-10-CM | POA: Diagnosis not present

## 2023-10-08 DIAGNOSIS — Z885 Allergy status to narcotic agent status: Secondary | ICD-10-CM | POA: Diagnosis not present

## 2023-10-08 DIAGNOSIS — K838 Other specified diseases of biliary tract: Secondary | ICD-10-CM | POA: Diagnosis not present

## 2023-10-08 DIAGNOSIS — K831 Obstruction of bile duct: Secondary | ICD-10-CM | POA: Diagnosis not present

## 2023-10-08 DIAGNOSIS — Z79899 Other long term (current) drug therapy: Secondary | ICD-10-CM | POA: Diagnosis not present

## 2023-10-08 DIAGNOSIS — R7989 Other specified abnormal findings of blood chemistry: Secondary | ICD-10-CM | POA: Diagnosis not present

## 2023-10-08 DIAGNOSIS — Z79890 Hormone replacement therapy (postmenopausal): Secondary | ICD-10-CM | POA: Diagnosis not present

## 2023-10-08 DIAGNOSIS — Z881 Allergy status to other antibiotic agents status: Secondary | ICD-10-CM | POA: Diagnosis not present

## 2023-10-08 DIAGNOSIS — Z4659 Encounter for fitting and adjustment of other gastrointestinal appliance and device: Secondary | ICD-10-CM | POA: Diagnosis not present

## 2023-10-08 DIAGNOSIS — E039 Hypothyroidism, unspecified: Secondary | ICD-10-CM | POA: Diagnosis not present

## 2023-10-08 DIAGNOSIS — R933 Abnormal findings on diagnostic imaging of other parts of digestive tract: Secondary | ICD-10-CM | POA: Diagnosis not present

## 2023-10-08 DIAGNOSIS — R935 Abnormal findings on diagnostic imaging of other abdominal regions, including retroperitoneum: Secondary | ICD-10-CM | POA: Diagnosis not present

## 2023-10-08 DIAGNOSIS — I10 Essential (primary) hypertension: Secondary | ICD-10-CM | POA: Diagnosis not present

## 2023-10-08 DIAGNOSIS — Z944 Liver transplant status: Secondary | ICD-10-CM | POA: Diagnosis not present

## 2023-10-08 DIAGNOSIS — R748 Abnormal levels of other serum enzymes: Secondary | ICD-10-CM | POA: Diagnosis not present

## 2023-10-15 ENCOUNTER — Other Ambulatory Visit: Payer: Medicare Other

## 2023-10-21 ENCOUNTER — Other Ambulatory Visit (INDEPENDENT_AMBULATORY_CARE_PROVIDER_SITE_OTHER): Payer: Self-pay

## 2023-10-21 DIAGNOSIS — M9903 Segmental and somatic dysfunction of lumbar region: Secondary | ICD-10-CM | POA: Diagnosis not present

## 2023-10-21 DIAGNOSIS — K743 Primary biliary cirrhosis: Secondary | ICD-10-CM

## 2023-10-21 DIAGNOSIS — M9902 Segmental and somatic dysfunction of thoracic region: Secondary | ICD-10-CM | POA: Diagnosis not present

## 2023-10-21 DIAGNOSIS — M9901 Segmental and somatic dysfunction of cervical region: Secondary | ICD-10-CM | POA: Diagnosis not present

## 2023-10-21 DIAGNOSIS — S134XXA Sprain of ligaments of cervical spine, initial encounter: Secondary | ICD-10-CM | POA: Diagnosis not present

## 2023-10-21 MED ORDER — URSODIOL 300 MG PO CAPS: 900.0000 mg | ORAL_CAPSULE | Freq: Every day | ORAL | 5 refills | Status: DC

## 2023-10-24 DIAGNOSIS — Z944 Liver transplant status: Secondary | ICD-10-CM | POA: Diagnosis not present

## 2023-10-24 DIAGNOSIS — D849 Immunodeficiency, unspecified: Secondary | ICD-10-CM | POA: Diagnosis not present

## 2023-10-29 DIAGNOSIS — M9902 Segmental and somatic dysfunction of thoracic region: Secondary | ICD-10-CM | POA: Diagnosis not present

## 2023-10-29 DIAGNOSIS — M9901 Segmental and somatic dysfunction of cervical region: Secondary | ICD-10-CM | POA: Diagnosis not present

## 2023-10-29 DIAGNOSIS — S134XXA Sprain of ligaments of cervical spine, initial encounter: Secondary | ICD-10-CM | POA: Diagnosis not present

## 2023-10-29 DIAGNOSIS — M9903 Segmental and somatic dysfunction of lumbar region: Secondary | ICD-10-CM | POA: Diagnosis not present

## 2023-10-30 ENCOUNTER — Inpatient Hospital Stay: Payer: Medicare Other | Admitting: Physician Assistant

## 2023-10-30 ENCOUNTER — Inpatient Hospital Stay: Payer: Medicare Other

## 2023-11-12 DIAGNOSIS — C4442 Squamous cell carcinoma of skin of scalp and neck: Secondary | ICD-10-CM | POA: Diagnosis not present

## 2023-11-12 DIAGNOSIS — Z1509 Genetic susceptibility to other malignant neoplasm: Secondary | ICD-10-CM | POA: Diagnosis not present

## 2023-11-12 DIAGNOSIS — C44729 Squamous cell carcinoma of skin of left lower limb, including hip: Secondary | ICD-10-CM | POA: Diagnosis not present

## 2023-11-12 DIAGNOSIS — D485 Neoplasm of uncertain behavior of skin: Secondary | ICD-10-CM | POA: Diagnosis not present

## 2023-11-18 NOTE — Progress Notes (Signed)
 [facility] [address] [place], [place] [address]   CLINIC:  Medical Oncology/Hematology  PCP:  [name] LABOR, FNP [address] [place] [address] [phone]   REASON FOR VISIT: Iron  deficiency anemia   CURRENT THERAPY: Intermittent IV iron   INTERVAL HISTORY:   Ms. Hellberg 73 y.o. female returns for routine follow-up of iron  deficiency anemia.  She was last evaluated via telemedicine visit by Pleasant Barefoot PA-C on 07/24/2023. She last received Venofer  300 mg x 3 in December 2023, and reported feeling unchanged after IV iron .   She denies any hospitalizations or major changes in health since her last visit.   She has baseline fatigue, but energy has been even worse lately.  She continues to have intermittent loose, dark bowel movements which have been chronic ever since her colectomy.   This is unchanged, despite most recent EGD/APC treatment of GAVE on 02/26/2023.  No gross hematochezia, epistaxis, or other source of blood loss.  She been having some mild dyspnea on exertion.  No pica, restless legs, chest pain, lightheadedness, or syncope.    She continues to have frequent Mohs surgery for removal of squamous cell skin cancer related to immunosupprssion from transplant.   She reports little to no energy and 100% appetite.  She is maintaining a stable weight at this time.  ASSESSMENT & PLAN:  1.  Iron  deficiency anemia from malabsorption and chronic GI blood loss:  - Unable to tolerate oral iron . - Secondary to history of colon cancer s/p abdominal colectomy, malabsorption, and chronic blood loss from GAVE - Most recent EGD (02/26/2023) showed GAVE without bleeding, treated with APC. - Most recent sigmoidoscopy (02/26/2023): Patent end-to-side ileocolonic anastomosis with healthy-appearing mucosa; nonbleeding internal hemorrhoids - SPEP negative in 2019 - She last received IV iron  (Venofer  300 mg x 3) in December 2023  - Reports worsening fatigue -  previously had no improvement in symptoms after IV iron  - Dark stools ever since her colectomy.  Denies any epistaxis or frank rectal hemorrhage. - Labs today (11/19/2023): Hgb 15.2/MCV 100.6, ferritin 28, iron  saturation 21% Prior labs from September 2024 showed elevated ferritin 1130 in the setting of acute infection - PLAN: Recommend IV Venofer  300 mg x 3 due to worsening fatigue with ferritin <100. - Labs in 6 months, phone visit 1 week after  - Continue follow-up with GI (Dr. Eartha) for management of any GI blood loss   2.  Vitamin B12 deficiency  - She was previously taking Vitamin B12 tablet (2000 mcg) once daily (until February 2024) - Labs from 01/10/2023 showed elevated B12 at 1563, therefore OTC supplement was stopped by her PCP. - Most recent labs (07/17/2023): Vitamin B12 1300, normal MMA 218 - PLAN: No indication to restart B12 supplement at this time.   Will recheck B12/MMA in 6 months.   3.  Liver Transplant: - Had this [facility] in January 2015.  - She is on immunosuppression with tacrolimus  (previously on sirolimus , but switched to tacrolimus  due to frequent skin surgeries)   4.  Skin cancer on her scalp: - Secondary to chronic immunosuppression for liver transplant; Lynch Syndrome - She is status post 30 treatments of radiation to her scalp. - She is followed by dermatology and has had several areas treated recently with topical creams. - Plastic surgery for excision of scalp wound on 05/13/2023 - Continues to follow closely with dermatology/Mohs specialist for squamous cell carcinoma   5.  History of colon cancer with  [facility] SYNDROME: - Status post abdominal colectomy on 09/17/2011 with simultaneous total abdominal hysterectomy and bilateral salpingo-oophorectomy. - Found to have Lynch syndrome. - Per recommendations she is to have an EGD every 2 to 3 years and annual colonoscopy for review of her residual colon tissue at the anastomosis. - Sigmoidoscopy  (02/15/2021): Two nonbleeding erosions at ileocolonic anastomosis, small external hemorrhoids   PLAN SUMMARY: >> Venofer  300 mg x 3 >> Labs in 6 months = CBC/D, ferritin, iron /TIBC, B12, MMA >> PHONE visit in 6 months (1 week after labs)    ** Last office visit on 11/19/2023    REVIEW OF SYSTEMS:   Review of Systems  Constitutional:  Negative for appetite change, chills, diaphoresis, fever and unexpected weight change.  HENT:   Negative for lump/mass and nosebleeds.   Eyes:  Negative for eye problems.  Respiratory:  Positive for shortness of breath (with exertion). Negative for cough and hemoptysis.   Cardiovascular:  Negative for chest pain, leg swelling and palpitations.  Gastrointestinal:  Positive for diarrhea. Negative for abdominal pain, blood in stool, constipation, nausea and vomiting.  Genitourinary:  Negative for hematuria.   Skin: Negative.   Neurological:  Negative for dizziness, headaches and light-headedness.  Hematological:  Does not bruise/bleed easily.     PHYSICAL EXAM:  ECOG PERFORMANCE STATUS: 1 - Symptomatic but completely ambulatory  Vitals:   11/19/23 1008  BP: (!) 153/80  Pulse: 64  Resp: 16  Temp: 97.9 F (36.6 C)  SpO2: 100%   Filed Weights   11/19/23 1008  Weight: 140 lb 9.6 oz (63.8 kg)   Physical Exam Constitutional:      Appearance: Normal appearance. She is normal weight.  Cardiovascular:     Heart sounds: Normal heart sounds.  Pulmonary:     Breath sounds: Normal breath sounds.  Neurological:     General: No focal deficit present.     Mental Status: Mental status is at baseline.  Psychiatric:        Behavior: Behavior normal. Behavior is cooperative.     PAST MEDICAL/SURGICAL HISTORY:  Past Medical History:  Diagnosis Date   Abdominal wall hernia    Incarcerated status post surgical repair 2019 - [facility]   Anemia of chronic disease    Atypical nevus 01/21/2018   atypical neoplasm- Left scalp-ant (txpbx + MOHS), atypical neoplasm-  Left scalp post- (txpbx + MOHS)   Basal cell carcinoma    Colon cancer (HCC)    Colon surgery 2005 and 2012   History of pulmonary hypertension    Pre liver transplant   Hypertension    Hypothyroidism    Lynch syndrome    Osteopenia    Primary biliary cirrhosis (HCC)    Status post liver transplantation - follows at [facility]   SCCA (squamous cell carcinoma) of skin 02/23/2020   Right Upper Chest(moderate) (MOH's)   SCCA (squamous cell carcinoma) of skin 04/07/2020   Left Top Leg (Keratoacanthoma) treatment after biopsy   SCCA (squamous cell carcinoma) of skin 04/07/2020   Left Foot Dorsal (in situ) treatment after biopsy   SCCA (squamous cell carcinoma) of skin 03/27/2021   Right Upper Back (Keratoacanthoma) (excision) (clear)   SCCA (squamous cell carcinoma) of skin 06/13/2021   Right Shoulder - anterior (moderately differentiated) (tx p bx)   SCCA (squamous cell carcinoma) of skin 06/13/2021   Right Thigh - anterior (well differentiated) (tx p bx)   SCCA (squamous cell carcinoma) of skin 06/13/2021   Right Lower Leg - anterior (well  differentiated) (tx p bx)   Squamous cell carcinoma of skin 04/22/2018   KA-Right mid chest (txpbx), KA-left elbow crease (txpbx), insitu-Right mid chest inf. (exc)   Squamous cell carcinoma of skin 05/20/2018   well diff-Left upper shin (txpbx), well diff-Right lower forearm (txpbx), well diff-Right upper shin (txpbx)   Squamous cell carcinoma of skin 06/11/2018   Scc + margin-Right mid chest inferior    Squamous cell carcinoma of skin 06/27/2018   well diff-Left mid thigh(txpbx), well diff-Left inner thigh (txpbx),insitu-Right cheek (txpbx),well diff-right inner heel (txpbx)   Squamous cell carcinoma of skin 09/16/2018   well diff-left shoulder (txpbx), well diff-Right chin (txpbx), well diff-right chest lateral (CX35FU)   Squamous cell carcinoma of skin 10/01/2018   Right outer lower shin (Txpbx)   Squamous cell carcinoma of skin 04/03/2019   well  diff-Right center chest (MOHS), in situ-Right ear   Squamous cell carcinoma of skin 08/05/2019   in situ-Left calf (txpbx), in situ-left bicep (txpbx), well diff-Left chest,inf(txpbx), in situ-Right chest inf-(txpbx)   Squamous cell carcinoma of skin 11/19/2019   KA-left top leg (txpbx), modify-Riight forehead-(mohs), in situ-right hand (txpbx), in situ-Right forearm (txpbx), well diff-Right chest (txpbx), well diff-chin (txpbx)   Squamous cell carcinoma of skin 01/06/2020   KA- Left top leg   Squamous cell carcinoma of skin 05/12/2013   bowens-middle of chest (CX35FU)   Squamous cell carcinoma of skin 05/18/2015   well diff-Left upper arm (CX35FU + Exc),KA-right chest(txpbx), in situ-Left shin (txpbx), well diff-Right cheek (CX35FU), KA-Left post scalp (CX35FU)   Squamous cell carcinoma of skin 08/09/2015   KA-Left post scalp ((MOHS), in situ- mid chest (Txpbx +exc), in situ-Left upper arm inferior (txpbx)   Squamous cell carcinoma of skin 10/13/2015   Left upper arm-clear   Squamous cell carcinoma of skin 03/09/2016   mod diff-mid chest (txpbx+ exc), mod diff-Right chest (txpbx+exc), well diff-right cheek-(txpbx),well diff-Left hand-(txpbx), in situ-Left upper arm (txpbx), well diff-Right cheek -(txpbx), well diff-Right crease arm (txpbx)    Squamous cell carcinoma of skin 05/24/2016   well diff-Right nasal crease-(MOHS)   Squamous cell carcinoma of skin 08/02/2016   KA-Left chest med (txpbx)   Squamous cell carcinoma of skin 08/30/2016   in situ-Left outer zygoma (txpbx)   Squamous cell carcinoma of skin 12/06/2016   well diff-Left chest sup, Left shoudler, insitu- right post scalp   Squamous cell carcinoma of skin 02/14/2017   well diff-Left forearm (EXC),in situ-RIght ant neck   Squamous cell carcinoma of skin 06/14/2017   in situ-Right forearm (txpbx), in situ-Right chest (txpbx), well diff-left chest (txpbx), well diff-anterior neck- (txpbx)   Squamous cell carcinoma of skin  08/07/2017   well diff-Left upper shoulder (txpbx), sup and invasive-Left temple (txpbx), well diff-Right upper shin (txpbx), in situ-Right clavicle (txpbx)   Squamous cell carcinoma of skin 10/17/2017   well diff-ant. neck (MOHS), in situ-Right chest, inf (txpbx)   Squamous cell carcinoma of skin 01/21/2018   well diff- Right chest,ulnar (txpbx), well diff- right upper chest (txpbx), in situ-Right ant. crown (txpbx)   Squamous cell carcinoma of skin 08/02/2020   well diff-left lower leg-inferior (Txpbx)   Squamous cell carcinoma of skin 08/02/2020   well diff-right lower leg-mid (txpbx)   Squamous cell carcinoma of skin 08/02/2020   well diff-left chest upper   Squamous cell carcinoma of skin 08/02/2020   well diff-mid parietal scalp (MOHS)   Squamous cell carcinoma of skin 08/02/2020   well diff-right foot inner(txpbx)   Squamous  cell carcinoma of skin 08/02/2020   well diff- left lower leg medial (txpbx)   Squamous cell carcinoma of skin 08/02/2020   well diff-left lower leg anterior (txpbx)   Squamous cell carcinoma of skin 08/02/2020   well diff-left lower leg medial (txpbx)   Squamous cell carcinoma of skin 08/02/2020   well diff-right forearm-posterior (txpbx)   Past Surgical History:  Procedure Laterality Date   ABDOMINAL HERNIA REPAIR     Patient's states that she has had 8- 9 hernia surgeries   ABDOMINAL HYSTERECTOMY     BIOPSY  02/15/2021   Procedure: BIOPSY;  Surgeon: Golda Claudis PENNER, MD;  [facility];  Service: Endoscopy;;   BIOPSY  12/20/2021   Procedure: BIOPSY;  Surgeon: Golda Claudis PENNER, MD;  [facility];  Service: Endoscopy;;   CATARACT EXTRACTION W/PHACO Right 01/15/2020   Procedure: CATARACT EXTRACTION PHACO AND INTRAOCULAR LENS PLACEMENT (IOC);  Surgeon: Harrie Agent, MD;  [facility]: [facility];  Service: Ophthalmology;  Laterality: Right;  CDE: 7.89   CATARACT EXTRACTION W/PHACO Left 01/29/2020   Procedure: CATARACT EXTRACTION PHACO AND  INTRAOCULAR LENS PLACEMENT (IOC) (CDE: 6.33);  Surgeon: Harrie Agent, MD;  Location: [facility];  Service: Ophthalmology;  Laterality: Left;   CHOLECYSTECTOMY  2007   COLON SURGERY  2008   [facility] at [facility]   COLONOSCOPY     [facility]   ESOPHAGOGASTRODUODENOSCOPY N/A 08/21/2018   Procedure: ESOPHAGOGASTRODUODENOSCOPY (EGD);  Surgeon: Golda Claudis PENNER, MD;  [facility];  Service: Endoscopy;  Laterality: N/A;   ESOPHAGOGASTRODUODENOSCOPY (EGD) WITH PROPOFOL  N/A 12/16/2019   Procedure: ESOPHAGOGASTRODUODENOSCOPY (EGD) WITH PROPOFOL ;  Surgeon: Golda Claudis PENNER, MD;  [facility];  Service: Endoscopy;  Laterality: N/A;   ESOPHAGOGASTRODUODENOSCOPY (EGD) WITH PROPOFOL  N/A 12/20/2021   Procedure: ESOPHAGOGASTRODUODENOSCOPY (EGD) WITH PROPOFOL ;  Surgeon: Golda Claudis PENNER, MD;  [facility];  Service: Endoscopy;  Laterality: N/A;  9:05   ESOPHAGOGASTRODUODENOSCOPY (EGD) WITH PROPOFOL  N/A 02/14/2022   Procedure: ESOPHAGOGASTRODUODENOSCOPY (EGD) WITH PROPOFOL ;  Surgeon: Golda Claudis PENNER, MD;  [facility];  Service: Endoscopy;  Laterality: N/A;  730   ESOPHAGOGASTRODUODENOSCOPY (EGD) WITH PROPOFOL  N/A 06/04/2022   Procedure: ESOPHAGOGASTRODUODENOSCOPY (EGD) WITH PROPOFOL ;  Surgeon: Eartha Angelia Sieving, MD;  [facility];  Service: Gastroenterology;  Laterality: N/A;  145   ESOPHAGOGASTRODUODENOSCOPY (EGD) WITH PROPOFOL  N/A 02/26/2023   Procedure: ESOPHAGOGASTRODUODENOSCOPY (EGD) WITH PROPOFOL ;  Surgeon: Eartha Angelia Sieving, MD;  [facility];  Service: Gastroenterology;  Laterality: N/A;  9:15AM;ASA 3   EYE SURGERY     lasix    FLEXIBLE SIGMOIDOSCOPY N/A 10/20/2015   Procedure: FLEXIBLE SIGMOIDOSCOPY;  Surgeon: Claudis PENNER Golda, MD;  [facility];  Service: Endoscopy;  Laterality: N/A;  33 - Dr Golda has meeting until 1:00   FLEXIBLE SIGMOIDOSCOPY N/A 07/11/2016   Procedure: FLEXIBLE SIGMOIDOSCOPY;  Surgeon: Claudis PENNER Golda,  MD;  [facility];  Service: Endoscopy;  Laterality: N/A;  1200   FLEXIBLE SIGMOIDOSCOPY N/A 08/09/2017   Procedure: FLEXIBLE SIGMOIDOSCOPY;  Surgeon: Golda Claudis PENNER, MD;  [facility];  Service: Endoscopy;  Laterality: N/A;  1:00   FLEXIBLE SIGMOIDOSCOPY N/A 08/21/2018   Procedure: FLEXIBLE SIGMOIDOSCOPY;  Surgeon: Golda Claudis PENNER, MD;  [facility];  Service: Endoscopy;  Laterality: N/A;   FLEXIBLE SIGMOIDOSCOPY N/A 12/16/2019   Procedure: FLEXIBLE SIGMOIDOSCOPY wirh Propofol ;  Surgeon: Golda Claudis PENNER, MD;  [facility];  Service: Endoscopy;  Laterality: N/A;  7:30   FLEXIBLE SIGMOIDOSCOPY N/A 02/15/2021   Procedure: FLEXIBLE SIGMOIDOSCOPY WITH PROPOFOL ;  Surgeon: Golda Claudis PENNER, MD;  [facility];  Service: Endoscopy;  Laterality: N/A;  am   FLEXIBLE SIGMOIDOSCOPY N/A 12/20/2021   Procedure: FLEXIBLE SIGMOIDOSCOPY;  Surgeon: Golda Claudis PENNER, MD;  [facility];  Service: Endoscopy;  Laterality: N/A;   FLEXIBLE SIGMOIDOSCOPY N/A 02/26/2023   Procedure: FLEXIBLE SIGMOIDOSCOPY;  Surgeon: Eartha Angelia Sieving, MD;  [facility];  Service: Gastroenterology;  Laterality: N/A;  9:15AM; ASA 3   FRACTURE SURGERY     right wrist metal plate   GI RADIOFREQUENCY ABLATION N/A 02/14/2022   Procedure: GI RADIOFREQUENCY ABLATION;  Surgeon: Golda Claudis PENNER, MD;  [facility];  Service: Endoscopy;  Laterality: N/A;   HOT HEMOSTASIS N/A 12/20/2021   Procedure: HOT HEMOSTASIS (ARGON PLASMA COAGULATION/BICAP);  Surgeon: Golda Claudis PENNER, MD;  [facility];  Service: Endoscopy;  Laterality: N/A;   HOT HEMOSTASIS  06/04/2022   Procedure: HOT HEMOSTASIS (ARGON PLASMA COAGULATION/BICAP);  Surgeon: Eartha Angelia, Sieving, MD;  [facility];  Service: Gastroenterology;;   HOT HEMOSTASIS  02/26/2023   Procedure: HOT HEMOSTASIS (ARGON PLASMA COAGULATION/BICAP);  Surgeon: Eartha Angelia, Sieving, MD;   [facility];  Service: Gastroenterology;;   IRRIGATION AND DEBRIDEMENT OF WOUND WITH SPLIT THICKNESS SKIN GRAFT N/A 05/13/2023   Procedure: Excision of scalp wound with Myriad or Acell placement;  Surgeon: Lowery Estefana RAMAN, DO;  Location: [facility];  Service: Plastics;  Laterality: N/A;   LIVER TRANSPLANT  11/19/2013   POLYPECTOMY  08/09/2017   Procedure: POLYPECTOMY;  Surgeon: Golda Claudis PENNER, MD;  [facility];  Service: Endoscopy;;  colon small bowel   POLYPECTOMY N/A 02/14/2022   Procedure: POLYPECTOMY;  Surgeon: Golda Claudis PENNER, MD;  [facility];  Service: Endoscopy;  Laterality: N/A;   REVERSE SHOULDER ARTHROPLASTY Left 07/17/2018   Procedure: LEFT REVERSE SHOULDER ARTHROPLASTY;  Surgeon: Melita Drivers, MD;  Location: [facility];  Service: Orthopedics;  Laterality: Left;    REVERSE SHOULDER ARTHROPLASTY Right 07/20/2021   Procedure: REVERSE SHOULDER ARTHROPLASTY;  Surgeon: Melita Drivers, MD;  Location: [facility];  Service: Orthopedics;  Laterality: Right;   SHOULDER CLOSED REDUCTION Left 09/27/2019   Procedure: CLOSED REDUCTION SHOULDER;  Surgeon: Ernie Cough, MD;  Location: [facility];  Service: Orthopedics;  Laterality: Left;   SPLENECTOMY  2006   TOTAL SHOULDER REVISION Left 11/12/2019   Procedure: Revision Left Reverse Shoulder Arthroplasty with poly exchange SDD;  Surgeon: Melita Drivers, MD;  Location: [facility];  Service: Orthopedics;  Laterality: Left;  -SDDC   TYMPANOSTOMY TUBE PLACEMENT     UPPER GASTROINTESTINAL ENDOSCOPY     [facility] at [facility]    SOCIAL HISTORY:  Social History   Socioeconomic History   Marital status: Married    Spouse name: Johnny   Number of children: 1   Years of education: 12   Highest education level: 12th grade  Occupational History   Occupation: Disability    Employer: [employer]    Comment: Immunologist  Tobacco Use   Smoking status: Never   Smokeless tobacco: Never  Vaping Use    Vaping status: Never Used  Substance and Sexual Activity   Alcohol use: No    Alcohol/week: 0.0 standard drinks of alcohol   Drug use: No   Sexual activity: Yes    Birth control/protection: None  Other Topics Concern   Not  on file  Social History Narrative   Patient lives in a two story home with her husband. She has an adult son. She is retired from being an environmental health practitioner for 30 years.    Social Drivers of Corporate Investment Banker Strain: Low Risk  (07/04/2023)   Overall Financial Resource Strain (CARDIA)    Difficulty of Paying Living Expenses: Not hard at all  Food Insecurity: No Food Insecurity (07/04/2023)   Hunger Vital Sign    Worried About Running Out of Food in the Last Year: Never true    Ran Out of Food in the Last Year: Never true  Transportation Needs: No Transportation Needs (07/04/2023)   PRAPARE - Administrator, Civil Service (Medical): No    Lack of Transportation (Non-Medical): No  Physical Activity: Insufficiently Active (07/04/2023)   Exercise Vital Sign    Days of Exercise per Week: 3 days    Minutes of Exercise per Session: 30 min  Stress: No Stress Concern Present (07/04/2023)   [employer] of Occupational Health - Occupational Stress Questionnaire    Feeling of Stress : Not at all  Social Connections: Socially Integrated (07/04/2023)   Social Connection and Isolation Panel [NHANES]    Frequency of Communication with Friends and Family: More than three times a week    Frequency of Social Gatherings with Friends and Family: More than three times a week    Attends Religious Services: More than 4 times per year    Active Member of [employer] or Organizations: Yes    Attends Engineer, Structural: More than 4 times per year    Marital Status: Married  Catering Manager Violence: Not At Risk (07/04/2023)   Humiliation, Afraid, Rape, and Kick questionnaire    Fear of Current or Ex-Partner: No    Emotionally Abused: No     Physically Abused: No    Sexually Abused: No    FAMILY HISTORY:  Family History  Problem Relation Age of Onset   Prostate cancer Father    Colon cancer Father    Colon cancer Sister    Lung cancer Sister    Healthy Son    Alcohol abuse Brother    Allergic rhinitis Neg Hx    Asthma Neg Hx    Eczema Neg Hx    Urticaria Neg Hx     CURRENT MEDICATIONS:  Outpatient Encounter Medications as of 11/19/2023  Medication Sig Note   acetaminophen  (TYLENOL ) 500 MG tablet Take 1,000 mg by mouth every 6 (six) hours as needed (for pain.).     alendronate  (FOSAMAX ) 70 MG tablet TAKE 1 TABLET EVERY WEEK    ALPRAZolam  (XANAX ) 0.5 MG tablet Take 1 tablet (0.5 mg total) by mouth at bedtime.    Biotin w/ Vitamins C & E (HAIR SKIN & NAILS GUMMIES PO) Take 2 tablets by mouth daily.    Calcium Carb-Cholecalciferol  (CALCIUM 600 + D PO) Take 1 tablet by mouth 2 (two) times daily.    cetirizine  (ZYRTEC ) 10 MG tablet Take 1 tablet (10 mg total) by mouth daily as needed for allergies.    Cholecalciferol  (VITAMIN D ) 50 MCG (2000 UT) tablet Take 2,000 Units by mouth daily.    Clobetasol  Prop Emollient Base (CLOBETASOL  PROPIONATE E) 0.05 % emollient cream Apply 1 application. topically 2 (two) times daily. (Patient taking differently: Apply 1 application  topically 2 (two) times daily as needed (rash).)    clotrimazole -betamethasone  (LOTRISONE ) cream Apply 1 application topically 2 (two) times daily. (  Patient taking differently: Apply 1 application  topically daily as needed (Rash).)    famotidine  (PEPCID ) 20 MG tablet Take 1 tablet (20 mg total) by mouth 2 (two) times daily.    fluorouracil (EFUDEX) 5 % cream Apply 1 application topically daily as needed (cancer spots).     fluticasone  (FLONASE ) 50 MCG/ACT nasal spray Place 2 sprays into both nostrils daily. 08/22/2023: prn   gabapentin  (NEURONTIN ) 100 MG capsule Take 1 capsule (100 mg total) by mouth 3 (three) times daily as needed.    HYDROcodone -acetaminophen   (NORCO/VICODIN) 5-325 MG tablet Take 1 tablet by mouth every 4 (four) hours as needed.    hydrocortisone cream 1 % Apply 1 Application topically 2 (two) times daily as needed for itching.    ketoconazole  (NIZORAL ) 2 % cream Apply 1 Application topically 2 (two) times daily.    levothyroxine  (SYNTHROID ) 112 MCG tablet TAKE ONE (1) TABLET BY MOUTH EVERY DAY    losartan  (COZAAR ) 25 MG tablet Take 1 tablet (25 mg total) by mouth daily.    metoprolol  succinate (TOPROL -XL) 25 MG 24 hr tablet Take 1 tablet (25 mg total) by mouth daily.    Multiple Vitamins-Minerals (MULTIVITAMIN WITH MINERALS) tablet Take 1 tablet by mouth daily.    mupirocin  ointment (BACTROBAN ) 2 % Apply 1 application topically daily as needed (Cancer spots).    ondansetron  (ZOFRAN ) 4 MG tablet Take 1 tablet (4 mg total) by mouth every 8 (eight) hours as needed for nausea or vomiting.    pantoprazole  (PROTONIX ) 40 MG tablet Take 1 tablet (40 mg total) by mouth daily.    Probiotic Product (PROBIOTIC PO) Take 1 capsule by mouth daily.    tacrolimus  (PROGRAF ) 0.5 MG capsule Take 0.5 mg by mouth. 2 capsules in am and 3 capsules at night.    ursodiol  (ACTIGALL ) 300 MG capsule Take 3 capsules (900 mg total) by mouth daily.    vitamin C (ASCORBIC ACID) 250 MG tablet Take 250 mg by mouth daily.    Vitamin D , Ergocalciferol , (DRISDOL ) 1.25 MG (50000 UNIT) CAPS capsule TAKE 1 CAPSULE BY MOUTH ONCE A WEEK    vitamin E 180 MG (400 UNITS) capsule Take 400 Units by mouth daily.    [DISCONTINUED] silver  sulfADIAZINE  (SILVADENE ) 1 % cream Apply 1 Application topically daily. Apply to large surface area once to twice daily (Patient taking differently: Apply 1 Application topically daily as needed (skin irritation). Apply to large surface area once to twice daily)    [DISCONTINUED] sirolimus  (RAPAMUNE ) 1 MG tablet Take 1 mg by mouth 2 (two) times daily. (Patient not taking: Reported on 08/22/2023)    No facility-administered encounter medications on  file as of 11/19/2023.    ALLERGIES:  Allergies  Allergen Reactions   Ciprofloxacin  Itching   Codeine Nausea Only    LABORATORY DATA:  I have reviewed the labs as listed.  CBC    Component Value Date/Time   WBC 7.4 11/19/2023 0921   RBC 4.71 11/19/2023 0921   HGB 15.2 (H) 11/19/2023 0921   HGB 13.4 09/25/2022 0952   HCT 47.4 (H) 11/19/2023 0921   HCT 42.0 09/25/2022 0952   PLT 261 11/19/2023 0921   PLT 380 09/25/2022 0952   MCV 100.6 (H) 11/19/2023 0921   MCV 85 09/25/2022 0952   MCH 32.3 11/19/2023 0921   MCHC 32.1 11/19/2023 0921   RDW 14.3 11/19/2023 0921   RDW 14.1 09/25/2022 0952   LYMPHSABS 2.8 11/19/2023 0921   LYMPHSABS 1.6 09/25/2022 0952   MONOABS  1.1 (H) 11/19/2023 0921   EOSABS 0.3 11/19/2023 0921   EOSABS 0.2 09/25/2022 0952   BASOSABS 0.1 11/19/2023 0921   BASOSABS 0.1 09/25/2022 0952      Latest Ref Rng & Units 11/19/2023    9:29 AM 04/01/2023    9:15 AM 09/25/2022    9:52 AM  CMP  Glucose 70 - 99 mg/dL 885  895  96   BUN 8 - 23 mg/dL 27  24  11    Creatinine 0.44 - 1.00 mg/dL 8.92  9.00  9.08   Sodium 135 - 145 mmol/L 134  134  140   Potassium 3.5 - 5.1 mmol/L 4.7  5.0  5.1   Chloride 98 - 111 mmol/L 101  99  101   CO2 22 - 32 mmol/L 27  28  24    Calcium 8.9 - 10.3 mg/dL 9.3  9.4  9.5   Total Protein 6.5 - 8.1 g/dL 8.0  7.5  7.1   Total Bilirubin 0.0 - 1.2 mg/dL 0.5  0.7  0.3   Alkaline Phos 38 - 126 U/L 206  325  150   AST 15 - 41 U/L 28  40  27   ALT 0 - 44 U/L 20  33  15     DIAGNOSTIC IMAGING:  I have independently reviewed the relevant imaging and discussed with the patient.   WRAP UP:  All questions were answered. The patient knows to call the clinic with any problems, questions or concerns.  Medical decision making: Moderate  Time spent on visit: I spent 20 minutes counseling the patient face to face. The total time spent in the appointment was 30 minutes and more than 50% was on counseling.  Pleasant CHRISTELLA Barefoot, PA-C  11/19/23  10:40 AM

## 2023-11-19 ENCOUNTER — Inpatient Hospital Stay: Payer: Medicare Other | Attending: Hematology | Admitting: Physician Assistant

## 2023-11-19 ENCOUNTER — Inpatient Hospital Stay: Payer: Medicare Other | Admitting: Physician Assistant

## 2023-11-19 VITALS — BP 153/80 | HR 64 | Temp 97.9°F | Resp 16 | Wt 140.6 lb

## 2023-11-19 DIAGNOSIS — D638 Anemia in other chronic diseases classified elsewhere: Secondary | ICD-10-CM

## 2023-11-19 DIAGNOSIS — E538 Deficiency of other specified B group vitamins: Secondary | ICD-10-CM | POA: Insufficient documentation

## 2023-11-19 DIAGNOSIS — D500 Iron deficiency anemia secondary to blood loss (chronic): Secondary | ICD-10-CM

## 2023-11-19 DIAGNOSIS — Z944 Liver transplant status: Secondary | ICD-10-CM | POA: Insufficient documentation

## 2023-11-19 DIAGNOSIS — Z1509 Genetic susceptibility to other malignant neoplasm: Secondary | ICD-10-CM | POA: Diagnosis not present

## 2023-11-19 DIAGNOSIS — D509 Iron deficiency anemia, unspecified: Secondary | ICD-10-CM | POA: Diagnosis not present

## 2023-11-19 LAB — COMPREHENSIVE METABOLIC PANEL
ALT: 20 U/L (ref 0–44)
AST: 28 U/L (ref 15–41)
Albumin: 3.7 g/dL (ref 3.5–5.0)
Alkaline Phosphatase: 206 U/L — ABNORMAL HIGH (ref 38–126)
Anion gap: 6 (ref 5–15)
BUN: 27 mg/dL — ABNORMAL HIGH (ref 8–23)
CO2: 27 mmol/L (ref 22–32)
Calcium: 9.3 mg/dL (ref 8.9–10.3)
Chloride: 101 mmol/L (ref 98–111)
Creatinine, Ser: 1.07 mg/dL — ABNORMAL HIGH (ref 0.44–1.00)
GFR, Estimated: 55 mL/min — ABNORMAL LOW (ref >60)
Glucose, Bld: 114 mg/dL — ABNORMAL HIGH (ref 70–99)
Potassium: 4.7 mmol/L (ref 3.5–5.1)
Sodium: 134 mmol/L — ABNORMAL LOW (ref 135–145)
Total Bilirubin: 0.5 mg/dL (ref 0.0–1.2)
Total Protein: 8 g/dL (ref 6.5–8.1)

## 2023-11-19 LAB — CBC WITH DIFFERENTIAL/PLATELET
Abs Immature Granulocytes: 0.02 10*3/uL (ref 0.00–0.07)
Basophils Absolute: 0.1 10*3/uL (ref 0.0–0.1)
Basophils Relative: 2 %
Eosinophils Absolute: 0.3 10*3/uL (ref 0.0–0.5)
Eosinophils Relative: 4 %
HCT: 47.4 % — ABNORMAL HIGH (ref 36.0–46.0)
Hemoglobin: 15.2 g/dL — ABNORMAL HIGH (ref 12.0–15.0)
Immature Granulocytes: 0 %
Lymphocytes Relative: 38 %
Lymphs Abs: 2.8 10*3/uL (ref 0.7–4.0)
MCH: 32.3 pg (ref 26.0–34.0)
MCHC: 32.1 g/dL (ref 30.0–36.0)
MCV: 100.6 fL — ABNORMAL HIGH (ref 80.0–100.0)
Monocytes Absolute: 1.1 10*3/uL — ABNORMAL HIGH (ref 0.1–1.0)
Monocytes Relative: 15 %
Neutro Abs: 3.1 10*3/uL (ref 1.7–7.7)
Neutrophils Relative %: 41 %
Platelets: 261 10*3/uL (ref 150–400)
RBC: 4.71 MIL/uL (ref 3.87–5.11)
RDW: 14.3 % (ref 11.5–15.5)
WBC: 7.4 10*3/uL (ref 4.0–10.5)
nRBC: 0 % (ref 0.0–0.2)

## 2023-11-19 LAB — IRON AND TIBC
Iron: 75 ug/dL (ref 28–170)
Saturation Ratios: 21 % (ref 10.4–31.8)
TIBC: 359 ug/dL (ref 250–450)
UIBC: 284 ug/dL

## 2023-11-19 LAB — FERRITIN: Ferritin: 28 ng/mL (ref 11–307)

## 2023-11-19 NOTE — Patient Instructions (Signed)
 [facility] at [facility] **VISIT SUMMARY & IMPORTANT INSTRUCTIONS **   You were seen today by Pleasant Barefoot PA-C for your follow-up visit.  - Your blood levels look good, but your iron  levels are low. - We will schedule you for IV iron  x 3 doses. - We will recheck labs and discussed with phone visit in 6 months.  **Some of your labs showed mild dehydration.  Make sure that you are drinking plenty of water !   ** Thank you for trusting me with your healthcare!  I strive to provide all of my patients with quality care at each visit.  If you receive a survey for this visit, I would be so grateful to you for taking the time to provide feedback.  Thank you in advance!  ~ Mounir Skipper                   Dr. Alean Stands   &   Pleasant Barefoot, PA-C   - - - - - - - - - - - - - - - - - -    Thank you for choosing [facility] at [facility] to provide your oncology and hematology care.  To afford each patient quality time with our provider, please arrive at least 15 minutes before your scheduled appointment time.   If you have a lab appointment with the Cancer Center please come in thru the Main Entrance and check in at the main information desk.  You need to re-schedule your appointment should you arrive 10 or more minutes late.  We strive to give you quality time with our providers, and arriving late affects you and other patients whose appointments are after yours.  Also, if you no show three or more times for appointments you may be dismissed from the clinic at the providers discretion.     Again, thank you for choosing [facility].  Our hope is that these requests will decrease the amount of time that you wait before being seen by our physicians.       _____________________________________________________________  Should you have questions after your visit to [facility], please contact our office at [phone]  and follow the prompts.  Our office hours are 8:00 a.m. and 4:30 p.m. Monday - Friday.  Please note that voicemails left after 4:00 p.m. may not be returned until the following business day.  We are closed weekends and major holidays.  You do have access to a nurse 24-7, just call the main number to the clinic [phone] and do not press any options, hold on the line and a nurse will answer the phone.    For prescription refill requests, have your pharmacy contact our office and allow 72 hours.

## 2023-11-21 ENCOUNTER — Inpatient Hospital Stay: Payer: Medicare Other

## 2023-11-21 LAB — TACROLIMUS LEVEL: Tacrolimus (FK506) - LabCorp: 6.1 ng/mL (ref 2.0–20.0)

## 2023-11-22 DIAGNOSIS — M47816 Spondylosis without myelopathy or radiculopathy, lumbar region: Secondary | ICD-10-CM | POA: Diagnosis not present

## 2023-11-22 DIAGNOSIS — M47896 Other spondylosis, lumbar region: Secondary | ICD-10-CM | POA: Diagnosis not present

## 2023-11-28 ENCOUNTER — Telehealth: Payer: Self-pay | Admitting: Plastic Surgery

## 2023-11-28 DIAGNOSIS — D485 Neoplasm of uncertain behavior of skin: Secondary | ICD-10-CM | POA: Diagnosis not present

## 2023-11-28 DIAGNOSIS — C44321 Squamous cell carcinoma of skin of nose: Secondary | ICD-10-CM | POA: Diagnosis not present

## 2023-11-28 DIAGNOSIS — C001 Malignant neoplasm of external lower lip: Secondary | ICD-10-CM | POA: Diagnosis not present

## 2023-11-28 DIAGNOSIS — Z1509 Genetic susceptibility to other malignant neoplasm: Secondary | ICD-10-CM | POA: Diagnosis not present

## 2023-11-28 DIAGNOSIS — C44729 Squamous cell carcinoma of skin of left lower limb, including hip: Secondary | ICD-10-CM | POA: Diagnosis not present

## 2023-11-28 MED FILL — Iron Sucrose Inj 20 MG/ML (Fe Equiv): INTRAVENOUS | Qty: 15 | Status: AC

## 2023-11-28 NOTE — Telephone Encounter (Signed)
Provider would like to change her medication back and make sure its cleared with you

## 2023-11-29 ENCOUNTER — Inpatient Hospital Stay: Payer: Medicare Other

## 2023-12-03 NOTE — Telephone Encounter (Signed)
Patient was most recently seen on 08/13/2023.  Per note review, wound had healed very well and there was epithelialization noted over the wound.  Patient was instructed to follow-up as needed.  I called the patient in regards to call that our office received and terms of resuming medications that she takes in regards to her history of liver transplant.  She denies any new issues or concerns with her scalp wound.  I discussed with the patient that she may resume taking any medications at this point that her transplant providers recommend.  Patient expressed understanding.  Dr. Ulice Bold was also in agreement with patient resuming her medications.  I did try calling the [facility].  I did leave a voicemail for the clinical nurse, Baxter Hire, to further discuss and left our clinic number and my cell phone number.

## 2023-12-06 ENCOUNTER — Other Ambulatory Visit: Payer: Medicare Other

## 2023-12-06 ENCOUNTER — Inpatient Hospital Stay: Payer: Medicare Other

## 2023-12-06 DIAGNOSIS — D849 Immunodeficiency, unspecified: Secondary | ICD-10-CM | POA: Diagnosis not present

## 2023-12-06 DIAGNOSIS — Z944 Liver transplant status: Secondary | ICD-10-CM | POA: Diagnosis not present

## 2023-12-10 DIAGNOSIS — M47896 Other spondylosis, lumbar region: Secondary | ICD-10-CM | POA: Diagnosis not present

## 2023-12-13 ENCOUNTER — Inpatient Hospital Stay: Payer: Medicare Other

## 2023-12-13 DIAGNOSIS — C44622 Squamous cell carcinoma of skin of right upper limb, including shoulder: Secondary | ICD-10-CM | POA: Diagnosis not present

## 2023-12-13 DIAGNOSIS — C44722 Squamous cell carcinoma of skin of right lower limb, including hip: Secondary | ICD-10-CM | POA: Diagnosis not present

## 2023-12-13 DIAGNOSIS — D485 Neoplasm of uncertain behavior of skin: Secondary | ICD-10-CM | POA: Diagnosis not present

## 2023-12-20 ENCOUNTER — Inpatient Hospital Stay: Payer: Medicare Other | Attending: Hematology

## 2023-12-20 VITALS — BP 120/69 | HR 75 | Temp 96.5°F | Resp 18

## 2023-12-20 DIAGNOSIS — D500 Iron deficiency anemia secondary to blood loss (chronic): Secondary | ICD-10-CM | POA: Diagnosis not present

## 2023-12-20 DIAGNOSIS — D508 Other iron deficiency anemias: Secondary | ICD-10-CM

## 2023-12-20 DIAGNOSIS — K922 Gastrointestinal hemorrhage, unspecified: Secondary | ICD-10-CM | POA: Diagnosis not present

## 2023-12-20 DIAGNOSIS — B998 Other infectious disease: Secondary | ICD-10-CM

## 2023-12-20 DIAGNOSIS — K909 Intestinal malabsorption, unspecified: Secondary | ICD-10-CM | POA: Insufficient documentation

## 2023-12-20 LAB — COMPREHENSIVE METABOLIC PANEL
ALT: 19 U/L (ref 0–44)
AST: 30 U/L (ref 15–41)
Albumin: 3.3 g/dL — ABNORMAL LOW (ref 3.5–5.0)
Alkaline Phosphatase: 175 U/L — ABNORMAL HIGH (ref 38–126)
Anion gap: 11 (ref 5–15)
BUN: 20 mg/dL (ref 8–23)
CO2: 26 mmol/L (ref 22–32)
Calcium: 9 mg/dL (ref 8.9–10.3)
Chloride: 102 mmol/L (ref 98–111)
Creatinine, Ser: 0.82 mg/dL (ref 0.44–1.00)
GFR, Estimated: >60 mL/min (ref >60)
Glucose, Bld: 109 mg/dL — ABNORMAL HIGH (ref 70–99)
Potassium: 3.9 mmol/L (ref 3.5–5.1)
Sodium: 139 mmol/L (ref 135–145)
Total Bilirubin: 0.4 mg/dL (ref 0.0–1.2)
Total Protein: 7.5 g/dL (ref 6.5–8.1)

## 2023-12-20 LAB — CBC WITH DIFFERENTIAL/PLATELET
Abs Immature Granulocytes: 0.01 10*3/uL (ref 0.00–0.07)
Basophils Absolute: 0.1 10*3/uL (ref 0.0–0.1)
Basophils Relative: 1 %
Eosinophils Absolute: 0.2 10*3/uL (ref 0.0–0.5)
Eosinophils Relative: 3 %
HCT: 44.1 % (ref 36.0–46.0)
Hemoglobin: 13.7 g/dL (ref 12.0–15.0)
Immature Granulocytes: 0 %
Lymphocytes Relative: 36 %
Lymphs Abs: 2.1 10*3/uL (ref 0.7–4.0)
MCH: 30.8 pg (ref 26.0–34.0)
MCHC: 31.1 g/dL (ref 30.0–36.0)
MCV: 99.1 fL (ref 80.0–100.0)
Monocytes Absolute: 1 10*3/uL (ref 0.1–1.0)
Monocytes Relative: 16 %
Neutro Abs: 2.6 10*3/uL (ref 1.7–7.7)
Neutrophils Relative %: 44 %
Platelets: 269 10*3/uL (ref 150–400)
RBC: 4.45 MIL/uL (ref 3.87–5.11)
RDW: 12.6 % (ref 11.5–15.5)
WBC: 5.9 10*3/uL (ref 4.0–10.5)
nRBC: 0 % (ref 0.0–0.2)

## 2023-12-20 MED ORDER — ACETAMINOPHEN 325 MG PO TABS
650.0000 mg | ORAL_TABLET | Freq: Once | ORAL | Status: AC
  Administered 2023-12-20: 650 mg via ORAL
  Filled 2023-12-20: qty 2

## 2023-12-20 MED ORDER — SODIUM CHLORIDE 0.9 % IV SOLN
INTRAVENOUS | Status: DC
Start: 2023-12-20 — End: 2023-12-20

## 2023-12-20 MED ORDER — SODIUM CHLORIDE 0.9 % IV SOLN
300.0000 mg | Freq: Once | INTRAVENOUS | Status: AC
  Administered 2023-12-20: 300 mg via INTRAVENOUS
  Filled 2023-12-20: qty 300
  Filled 2023-12-20: qty 15

## 2023-12-20 MED ORDER — CETIRIZINE HCL 10 MG PO TABS
10.0000 mg | ORAL_TABLET | Freq: Once | ORAL | Status: AC
  Administered 2023-12-20: 10 mg via ORAL
  Filled 2023-12-20: qty 1

## 2023-12-20 NOTE — Patient Instructions (Signed)
 [facility] [place] - [facility]  Discharge Instructions: Thank you for choosing [facility] to provide your oncology and hematology care.  If you have a lab appointment with the Cancer Center - please note that after April 8th, 2024, all labs will be drawn in the cancer center.  You do not have to check in or register with the main entrance as you have in the past but will complete your check-in in the cancer center.  Wear comfortable clothing and clothing appropriate for easy access to any Portacath or PICC line.   We strive to give you quality time with your provider. You may need to reschedule your appointment if you arrive late (15 or more minutes).  Arriving late affects you and other patients whose appointments are after yours.  Also, if you miss three or more appointments without notifying the office, you may be dismissed from the clinic at the provider's discretion.      For prescription refill requests, have your pharmacy contact our office and allow 72 hours for refills to be completed.    Today you received the following chemotherapy and/or immunotherapy agents Venofer      To help prevent nausea and vomiting after your treatment, we encourage you to take your nausea medication as directed.  BELOW ARE SYMPTOMS THAT SHOULD BE REPORTED IMMEDIATELY: *FEVER GREATER THAN 100.4 F (38 C) OR HIGHER *CHILLS OR SWEATING *NAUSEA AND VOMITING THAT IS NOT CONTROLLED WITH YOUR NAUSEA MEDICATION *UNUSUAL SHORTNESS OF BREATH *UNUSUAL BRUISING OR BLEEDING *URINARY PROBLEMS (pain or burning when urinating, or frequent urination) *BOWEL PROBLEMS (unusual diarrhea, constipation, pain near the anus) TENDERNESS IN MOUTH AND THROAT WITH OR WITHOUT PRESENCE OF ULCERS (sore throat, sores in mouth, or a toothache) UNUSUAL RASH, SWELLING OR PAIN  UNUSUAL VAGINAL DISCHARGE OR ITCHING   Items with * indicate a potential emergency and should be followed up  as soon as possible or go to the Emergency Department if any problems should occur.  Please show the CHEMOTHERAPY ALERT CARD or IMMUNOTHERAPY ALERT CARD at check-in to the Emergency Department and triage nurse.  Should you have questions after your visit or need to cancel or reschedule your appointment, please contact [facility] [place] - A DEPT OF Eligha Bridegroom [facility] [phone]  and follow the prompts.  Office hours are 8:00 a.m. to 4:30 p.m. Monday - Friday. Please note that voicemails left after 4:00 p.m. may not be returned until the following business day.  We are closed weekends and major holidays. You have access to a nurse at all times for urgent questions. Please call the main number to the clinic [phone] and follow the prompts.  For any non-urgent questions, you may also contact your provider using MyChart. We now offer e-Visits for anyone 66 and older to request care online for non-urgent symptoms. For details visit [email or web address].   Also download the MyChart app! Go to the app store, search "MyChart", open the app, select [place], and log in with your MyChart username and password.

## 2023-12-20 NOTE — Progress Notes (Signed)
 Patient presents today for Venofer infusion per providers order.  Vital signs WNL.  Patient has no new complaints at this time.  Peripheral IV started and blood return noted pre and post infusion.  Stable during infusion without adverse affects.  Vital signs stable.  No complaints at this time.  Discharge from clinic ambulatory in stable condition.  Alert and oriented X 3.  Follow up with [facility] as scheduled.

## 2023-12-23 LAB — SIROLIMUS LEVEL: Sirolimus (Rapamycin): 5.4 ng/mL (ref 3.0–20.0)

## 2023-12-24 ENCOUNTER — Encounter: Payer: Self-pay | Admitting: Hematology

## 2023-12-24 NOTE — Progress Notes (Signed)
Lab orders entered

## 2023-12-26 DIAGNOSIS — D485 Neoplasm of uncertain behavior of skin: Secondary | ICD-10-CM | POA: Diagnosis not present

## 2023-12-26 DIAGNOSIS — C44629 Squamous cell carcinoma of skin of left upper limb, including shoulder: Secondary | ICD-10-CM | POA: Diagnosis not present

## 2023-12-26 DIAGNOSIS — Z48817 Encounter for surgical aftercare following surgery on the skin and subcutaneous tissue: Secondary | ICD-10-CM | POA: Diagnosis not present

## 2023-12-27 ENCOUNTER — Inpatient Hospital Stay: Payer: Medicare Other

## 2023-12-27 VITALS — BP 145/66 | HR 66 | Temp 97.9°F | Resp 18

## 2023-12-27 DIAGNOSIS — K909 Intestinal malabsorption, unspecified: Secondary | ICD-10-CM | POA: Diagnosis not present

## 2023-12-27 DIAGNOSIS — D500 Iron deficiency anemia secondary to blood loss (chronic): Secondary | ICD-10-CM | POA: Diagnosis not present

## 2023-12-27 DIAGNOSIS — K922 Gastrointestinal hemorrhage, unspecified: Secondary | ICD-10-CM | POA: Diagnosis not present

## 2023-12-27 DIAGNOSIS — D508 Other iron deficiency anemias: Secondary | ICD-10-CM

## 2023-12-27 MED ORDER — IRON SUCROSE 20 MG/ML IV SOLN
300.0000 mg | Freq: Once | INTRAVENOUS | Status: AC
  Administered 2023-12-27: 300 mg via INTRAVENOUS
  Filled 2023-12-27: qty 300

## 2023-12-27 MED ORDER — SODIUM CHLORIDE 0.9 % IV SOLN: INTRAVENOUS | Status: DC

## 2023-12-27 NOTE — Progress Notes (Signed)
Patient presents today for Iron infusion, patient reports taking pre-meds at home. Patient tolerated iron infusion with no complaints voiced. Peripheral IV site clean and dry with good blood return noted before and after infusion. Band aid applied. VSS with discharge and left in satisfactory condition with no s/s of distress noted.

## 2023-12-27 NOTE — Patient Instructions (Signed)
[facility] [place] [facility]  Discharge Instructions: Thank you for choosing [facility] to provide your oncology and hematology care.  If you have a lab appointment with the Cancer Center - please note that after April 8th, 2024, all labs will be drawn in the cancer center.  You do not have to check in or register with the main entrance as you have in the past but will complete your check-in in the cancer center.  Wear comfortable clothing and clothing appropriate for easy access to any Portacath or PICC line.   We strive to give you quality time with your provider. You may need to reschedule your appointment if you arrive late (15 or more minutes).  Arriving late affects you and other patients whose appointments are after yours.  Also, if you miss three or more appointments without notifying the office, you may be dismissed from the clinic at the provider's discretion.      For prescription refill requests, have your pharmacy contact our office and allow 72 hours for refills to be completed.    Today you received the following Venofer, return as scheduled.   To help prevent nausea and vomiting after your treatment, we encourage you to take your nausea medication as directed.  BELOW ARE SYMPTOMS THAT SHOULD BE REPORTED IMMEDIATELY: *FEVER GREATER THAN 100.4 F (38 C) OR HIGHER *CHILLS OR SWEATING *NAUSEA AND VOMITING THAT IS NOT CONTROLLED WITH YOUR NAUSEA MEDICATION *UNUSUAL SHORTNESS OF BREATH *UNUSUAL BRUISING OR BLEEDING *URINARY PROBLEMS (pain or burning when urinating, or frequent urination) *BOWEL PROBLEMS (unusual diarrhea, constipation, pain near the anus) TENDERNESS IN MOUTH AND THROAT WITH OR WITHOUT PRESENCE OF ULCERS (sore throat, sores in mouth, or a toothache) UNUSUAL RASH, SWELLING OR PAIN  UNUSUAL VAGINAL DISCHARGE OR ITCHING   Items with * indicate a potential emergency and should be followed up as soon as possible or  go to the Emergency Department if any problems should occur.  Please show the CHEMOTHERAPY ALERT CARD or IMMUNOTHERAPY ALERT CARD at check-in to the Emergency Department and triage nurse.  Should you have questions after your visit or need to cancel or reschedule your appointment, please contact [facility] [place] - A DEPT OF Eligha Bridegroom [facility] [phone]  and follow the prompts.  Office hours are 8:00 a.m. to 4:30 p.m. Monday - Friday. Please note that voicemails left after 4:00 p.m. may not be returned until the following business day.  We are closed weekends and major holidays. You have access to a nurse at all times for urgent questions. Please call the main number to the clinic [phone] and follow the prompts.  For any non-urgent questions, you may also contact your provider using MyChart. We now offer e-Visits for anyone 81 and older to request care online for non-urgent symptoms. For details visit [email or web address].   Also download the MyChart app! Go to the app store, search "MyChart", open the app, select [place], and log in with your MyChart username and password.

## 2023-12-31 DIAGNOSIS — M779 Enthesopathy, unspecified: Secondary | ICD-10-CM | POA: Diagnosis not present

## 2023-12-31 DIAGNOSIS — M79672 Pain in left foot: Secondary | ICD-10-CM | POA: Diagnosis not present

## 2024-01-03 ENCOUNTER — Inpatient Hospital Stay: Payer: Medicare Other

## 2024-01-03 VITALS — BP 144/76 | HR 73 | Temp 97.8°F | Resp 17 | Wt 144.9 lb

## 2024-01-03 DIAGNOSIS — K909 Intestinal malabsorption, unspecified: Secondary | ICD-10-CM | POA: Diagnosis not present

## 2024-01-03 DIAGNOSIS — D500 Iron deficiency anemia secondary to blood loss (chronic): Secondary | ICD-10-CM | POA: Diagnosis not present

## 2024-01-03 DIAGNOSIS — D508 Other iron deficiency anemias: Secondary | ICD-10-CM

## 2024-01-03 DIAGNOSIS — K922 Gastrointestinal hemorrhage, unspecified: Secondary | ICD-10-CM | POA: Diagnosis not present

## 2024-01-03 MED ORDER — SODIUM CHLORIDE 0.9 % IV SOLN
300.0000 mg | Freq: Once | INTRAVENOUS | Status: AC
  Administered 2024-01-03: 300 mg via INTRAVENOUS
  Filled 2024-01-03: qty 300

## 2024-01-03 MED ORDER — SODIUM CHLORIDE 0.9 % IV SOLN: INTRAVENOUS | Status: DC

## 2024-01-03 NOTE — Patient Instructions (Signed)
 [facility] [place] [facility]  Discharge Instructions: Thank you for choosing [facility] to provide your oncology and hematology care.  If you have a lab appointment with the Cancer Center - please note that after April 8th, 2024, all labs will be drawn in the cancer center.  You do not have to check in or register with the main entrance as you have in the past but will complete your check-in in the cancer center.  Wear comfortable clothing and clothing appropriate for easy access to any Portacath or PICC line.   We strive to give you quality time with your provider. You may need to reschedule your appointment if you arrive late (15 or more minutes).  Arriving late affects you and other patients whose appointments are after yours.  Also, if you miss three or more appointments without notifying the office, you may be dismissed from the clinic at the provider's discretion.      For prescription refill requests, have your pharmacy contact our office and allow 72 hours for refills to be completed.    Today you received the following Venofer, return as scheduled.   To help prevent nausea and vomiting after your treatment, we encourage you to take your nausea medication as directed.  BELOW ARE SYMPTOMS THAT SHOULD BE REPORTED IMMEDIATELY: *FEVER GREATER THAN 100.4 F (38 C) OR HIGHER *CHILLS OR SWEATING *NAUSEA AND VOMITING THAT IS NOT CONTROLLED WITH YOUR NAUSEA MEDICATION *UNUSUAL SHORTNESS OF BREATH *UNUSUAL BRUISING OR BLEEDING *URINARY PROBLEMS (pain or burning when urinating, or frequent urination) *BOWEL PROBLEMS (unusual diarrhea, constipation, pain near the anus) TENDERNESS IN MOUTH AND THROAT WITH OR WITHOUT PRESENCE OF ULCERS (sore throat, sores in mouth, or a toothache) UNUSUAL RASH, SWELLING OR PAIN  UNUSUAL VAGINAL DISCHARGE OR ITCHING   Items with * indicate a potential emergency and should be followed up as soon as possible or  go to the Emergency Department if any problems should occur.  Please show the CHEMOTHERAPY ALERT CARD or IMMUNOTHERAPY ALERT CARD at check-in to the Emergency Department and triage nurse.  Should you have questions after your visit or need to cancel or reschedule your appointment, please contact [facility] [place] - A DEPT OF Eligha Bridegroom [facility] [phone]  and follow the prompts.  Office hours are 8:00 a.m. to 4:30 p.m. Monday - Friday. Please note that voicemails left after 4:00 p.m. may not be returned until the following business day.  We are closed weekends and major holidays. You have access to a nurse at all times for urgent questions. Please call the main number to the clinic [phone] and follow the prompts.  For any non-urgent questions, you may also contact your provider using MyChart. We now offer e-Visits for anyone 81 and older to request care online for non-urgent symptoms. For details visit [email or web address].   Also download the MyChart app! Go to the app store, search "MyChart", open the app, select [place], and log in with your MyChart username and password.

## 2024-01-03 NOTE — Progress Notes (Signed)
Patient presents today for Iron, reports taking pre-medications at home. Patient tolerated iron infusion with no complaints voiced. Peripheral IV site clean and dry with good blood return noted before and after infusion. Band aid applied. VSS with discharge and left in satisfactory condition with no s/s of distress noted.

## 2024-01-06 ENCOUNTER — Other Ambulatory Visit: Payer: Self-pay | Admitting: Family

## 2024-01-06 DIAGNOSIS — F1320 Sedative, hypnotic or anxiolytic dependence, uncomplicated: Secondary | ICD-10-CM

## 2024-01-06 DIAGNOSIS — Z79899 Other long term (current) drug therapy: Secondary | ICD-10-CM

## 2024-01-06 DIAGNOSIS — F411 Generalized anxiety disorder: Secondary | ICD-10-CM

## 2024-01-07 DIAGNOSIS — Z944 Liver transplant status: Secondary | ICD-10-CM | POA: Diagnosis not present

## 2024-01-07 DIAGNOSIS — D849 Immunodeficiency, unspecified: Secondary | ICD-10-CM | POA: Diagnosis not present

## 2024-01-08 DIAGNOSIS — R748 Abnormal levels of other serum enzymes: Secondary | ICD-10-CM | POA: Diagnosis not present

## 2024-01-08 DIAGNOSIS — E039 Hypothyroidism, unspecified: Secondary | ICD-10-CM | POA: Diagnosis not present

## 2024-01-08 DIAGNOSIS — Z79899 Other long term (current) drug therapy: Secondary | ICD-10-CM | POA: Diagnosis not present

## 2024-01-08 DIAGNOSIS — Z944 Liver transplant status: Secondary | ICD-10-CM | POA: Diagnosis not present

## 2024-01-08 DIAGNOSIS — K838 Other specified diseases of biliary tract: Secondary | ICD-10-CM | POA: Diagnosis not present

## 2024-01-08 DIAGNOSIS — Z79890 Hormone replacement therapy (postmenopausal): Secondary | ICD-10-CM | POA: Diagnosis not present

## 2024-01-08 DIAGNOSIS — K9189 Other postprocedural complications and disorders of digestive system: Secondary | ICD-10-CM | POA: Diagnosis not present

## 2024-01-08 DIAGNOSIS — Z9049 Acquired absence of other specified parts of digestive tract: Secondary | ICD-10-CM | POA: Diagnosis not present

## 2024-01-08 DIAGNOSIS — Z4659 Encounter for fitting and adjustment of other gastrointestinal appliance and device: Secondary | ICD-10-CM | POA: Diagnosis not present

## 2024-01-08 DIAGNOSIS — Z4689 Encounter for fitting and adjustment of other specified devices: Secondary | ICD-10-CM | POA: Diagnosis not present

## 2024-01-08 DIAGNOSIS — K831 Obstruction of bile duct: Secondary | ICD-10-CM | POA: Diagnosis not present

## 2024-01-08 DIAGNOSIS — Z9889 Other specified postprocedural states: Secondary | ICD-10-CM | POA: Diagnosis not present

## 2024-01-10 ENCOUNTER — Encounter (HOSPITAL_BASED_OUTPATIENT_CLINIC_OR_DEPARTMENT_OTHER): Payer: Self-pay | Admitting: Emergency Medicine

## 2024-01-10 ENCOUNTER — Emergency Department (HOSPITAL_BASED_OUTPATIENT_CLINIC_OR_DEPARTMENT_OTHER): Payer: Medicare Other

## 2024-01-10 ENCOUNTER — Emergency Department (HOSPITAL_BASED_OUTPATIENT_CLINIC_OR_DEPARTMENT_OTHER)
Admission: EM | Admit: 2024-01-10 | Discharge: 2024-01-10 | Disposition: A | Discharge status: Home / Self Care | Payer: Medicare Other | Attending: Emergency Medicine | Admitting: Emergency Medicine

## 2024-01-10 DIAGNOSIS — I10 Essential (primary) hypertension: Secondary | ICD-10-CM | POA: Insufficient documentation

## 2024-01-10 DIAGNOSIS — Z85828 Personal history of other malignant neoplasm of skin: Secondary | ICD-10-CM | POA: Diagnosis not present

## 2024-01-10 DIAGNOSIS — E039 Hypothyroidism, unspecified: Secondary | ICD-10-CM | POA: Insufficient documentation

## 2024-01-10 DIAGNOSIS — Z85038 Personal history of other malignant neoplasm of large intestine: Secondary | ICD-10-CM | POA: Diagnosis not present

## 2024-01-10 DIAGNOSIS — I6523 Occlusion and stenosis of bilateral carotid arteries: Secondary | ICD-10-CM | POA: Diagnosis not present

## 2024-01-10 DIAGNOSIS — Z79899 Other long term (current) drug therapy: Secondary | ICD-10-CM | POA: Insufficient documentation

## 2024-01-10 DIAGNOSIS — R519 Headache, unspecified: Secondary | ICD-10-CM | POA: Diagnosis not present

## 2024-01-10 DIAGNOSIS — G500 Trigeminal neuralgia: Secondary | ICD-10-CM | POA: Diagnosis not present

## 2024-01-10 LAB — BASIC METABOLIC PANEL
Anion gap: 9 (ref 5–15)
BUN: 15 mg/dL (ref 8–23)
CO2: 27 mmol/L (ref 22–32)
Calcium: 9.6 mg/dL (ref 8.9–10.3)
Chloride: 101 mmol/L (ref 98–111)
Creatinine, Ser: 0.8 mg/dL (ref 0.44–1.00)
GFR, Estimated: >60 mL/min (ref >60)
Glucose, Bld: 107 mg/dL — ABNORMAL HIGH (ref 70–99)
Potassium: 3.8 mmol/L (ref 3.5–5.1)
Sodium: 137 mmol/L (ref 135–145)

## 2024-01-10 LAB — HEPATIC FUNCTION PANEL
ALT: 69 U/L — ABNORMAL HIGH (ref 0–44)
AST: 63 U/L — ABNORMAL HIGH (ref 15–41)
Albumin: 4 g/dL (ref 3.5–5.0)
Alkaline Phosphatase: 310 U/L — ABNORMAL HIGH (ref 38–126)
Bilirubin, Direct: 0.4 mg/dL — ABNORMAL HIGH (ref 0.0–0.2)
Indirect Bilirubin: 0.7 mg/dL (ref 0.3–0.9)
Total Bilirubin: 1.1 mg/dL (ref 0.0–1.2)
Total Protein: 7.9 g/dL (ref 6.5–8.1)

## 2024-01-10 LAB — CBC WITH DIFFERENTIAL/PLATELET
Abs Immature Granulocytes: 0.04 10*3/uL (ref 0.00–0.07)
Basophils Absolute: 0.1 10*3/uL (ref 0.0–0.1)
Basophils Relative: 1 %
Eosinophils Absolute: 0.4 10*3/uL (ref 0.0–0.5)
Eosinophils Relative: 3 %
HCT: 42.2 % (ref 36.0–46.0)
Hemoglobin: 13.2 g/dL (ref 12.0–15.0)
Immature Granulocytes: 0 %
Lymphocytes Relative: 28 %
Lymphs Abs: 3.5 10*3/uL (ref 0.7–4.0)
MCH: 30.6 pg (ref 26.0–34.0)
MCHC: 31.3 g/dL (ref 30.0–36.0)
MCV: 97.9 fL (ref 80.0–100.0)
Monocytes Absolute: 1.4 10*3/uL — ABNORMAL HIGH (ref 0.1–1.0)
Monocytes Relative: 11 %
Neutro Abs: 6.9 10*3/uL (ref 1.7–7.7)
Neutrophils Relative %: 57 %
Platelets: 237 10*3/uL (ref 150–400)
RBC: 4.31 MIL/uL (ref 3.87–5.11)
RDW: 13.3 % (ref 11.5–15.5)
WBC: 12.3 10*3/uL — ABNORMAL HIGH (ref 4.0–10.5)
nRBC: 0 % (ref 0.0–0.2)

## 2024-01-10 LAB — SEDIMENTATION RATE: Sed Rate: 31 mm/h — ABNORMAL HIGH (ref 0–22)

## 2024-01-10 MED ORDER — OXYCODONE HCL 5 MG PO TABS
5.0000 mg | ORAL_TABLET | ORAL | Status: AC
  Administered 2024-01-10: 5 mg via ORAL
  Filled 2024-01-10: qty 1

## 2024-01-10 MED ORDER — FLUORESCEIN SODIUM 1 MG OP STRP
1.0000 | ORAL_STRIP | Freq: Once | OPHTHALMIC | Status: AC
  Administered 2024-01-10: 1 via OPHTHALMIC
  Filled 2024-01-10: qty 1

## 2024-01-10 MED ORDER — FENTANYL CITRATE PF 50 MCG/ML IJ SOSY
50.0000 ug | PREFILLED_SYRINGE | Freq: Once | INTRAMUSCULAR | Status: AC
  Administered 2024-01-10: 50 ug via INTRAVENOUS
  Filled 2024-01-10: qty 1

## 2024-01-10 MED ORDER — SUMATRIPTAN SUCCINATE 50 MG PO TABS: 50.0000 mg | ORAL_TABLET | ORAL | 0 refills | Status: AC | PRN

## 2024-01-10 MED ORDER — TETRACAINE HCL 0.5 % OP SOLN
2.0000 [drp] | Freq: Once | OPHTHALMIC | Status: AC
  Administered 2024-01-10: 2 [drp] via OPHTHALMIC
  Filled 2024-01-10: qty 4

## 2024-01-10 NOTE — ED Provider Notes (Incomplete)
 [place] EMERGENCY DEPARTMENT AT [facility] Provider Note   CSN: [id number] Arrival date & time: 01/10/24  1911     History {Add pertinent medical, surgical, social history, OB history to HPI:1} No chief complaint on file.   Cassidy Barber is a 73 y.o. female.  73 year old female history of liver transplant on sirolimus, hypertension, hypothyroidism, colon cancer status post colectomy, and skin cancer presents the emergency department with headache.  Patient reports that for the past week she has had an intermittent headache.  Says that over the past 2 days it worsened.  Describes it as a constant sensation with intermittent sharp pains around her right eye.  Denies any vision changes but does have severe photophobia that has been leaving her right eye closed.  No rashes.  No hearing loss or tinnitus.  Does not have a history of similar headaches.  Reports that cancer is in remission.  Denies any jaw claudication, fevers, muscle soreness or weakness.       Home Medications Prior to Admission medications   Medication Sig Start Date End Date Taking? Authorizing Provider  acetaminophen (TYLENOL) 500 MG tablet Take 1,000 mg by mouth every 6 (six) hours as needed (for pain.).     [provider]  alendronate (FOSAMAX) 70 MG tablet TAKE 1 TABLET EVERY WEEK 07/18/23   Jannifer Rodney A, FNP  ALPRAZolam Prudy Feeler) 0.5 MG tablet Take 1 tablet (0.5 mg total) by mouth at bedtime. 07/18/23   Junie Spencer, FNP  Biotin w/ Vitamins C & E (HAIR SKIN & NAILS GUMMIES PO) Take 2 tablets by mouth daily.    [provider]  Calcium Carb-Cholecalciferol (CALCIUM 600 + D PO) Take 1 tablet by mouth 2 (two) times daily.    [provider]  cetirizine (ZYRTEC) 10 MG tablet Take 1 tablet (10 mg total) by mouth daily as needed for allergies. 01/28/23   Jannifer Rodney A, FNP  Cholecalciferol (VITAMIN D) 50 MCG (2000 UT) tablet Take 2,000 Units by mouth daily.    [provider]  Clobetasol Prop Emollient Base (CLOBETASOL PROPIONATE E) 0.05 % emollient cream Apply 1 application. topically 2 (two) times daily. Patient taking differently: Apply 1 application  topically 2 (two) times daily as needed (rash). 01/24/22   Sheffield, Judye Bos, PA-C  clotrimazole-betamethasone (LOTRISONE) cream Apply 1 application topically 2 (two) times daily. Patient taking differently: Apply 1 application  topically daily as needed (Rash). 10/10/20   Daphine Deutscher, Mary-Margaret, FNP  famotidine (PEPCID) 20 MG tablet Take 1 tablet (20 mg total) by mouth 2 (two) times daily. 07/18/23   Jannifer Rodney A, FNP  fluorouracil (EFUDEX) 5 % cream Apply 1 application topically daily as needed (cancer spots).  06/11/18   [provider]  fluticasone (FLONASE) 50 MCG/ACT nasal spray Place 2 sprays into both nostrils daily. 07/18/23   Junie Spencer, FNP  gabapentin (NEURONTIN) 100 MG capsule Take 1 capsule (100 mg total) by mouth 3 (three) times daily as needed. 09/27/23   Junie Spencer, FNP  HYDROcodone-acetaminophen (NORCO/VICODIN) 5-325 MG tablet Take 1 tablet by mouth every 4 (four) hours as needed. 10/03/23   [provider]  hydrocortisone cream 1 % Apply 1 Application topically 2 (two) times daily as needed for itching.    [provider]  ketoconazole (NIZORAL) 2 % cream Apply 1 Application topically 2 (two) times daily. 05/22/23   Milian, Aleen Campi, FNP  levothyroxine (SYNTHROID) 112 MCG tablet TAKE ONE (1) TABLET BY MOUTH EVERY  DAY 04/17/23   Junie Spencer, FNP  losartan (COZAAR) 25 MG tablet Take 1 tablet (25 mg total) by mouth daily. 07/18/23   Jannifer Rodney A, FNP  metoprolol succinate (TOPROL-XL) 25 MG 24 hr tablet Take 1 tablet (25 mg total) by mouth daily. 07/18/23   Junie Spencer, FNP  Multiple Vitamins-Minerals (MULTIVITAMIN WITH MINERALS) tablet Take 1 tablet by mouth daily.    [provider]  mupirocin ointment (BACTROBAN) 2 % Apply 1  application topically daily as needed (Cancer spots). 11/28/21   Sheffield, Harvin Hazel R, PA-C  ondansetron (ZOFRAN) 4 MG tablet Take 1 tablet (4 mg total) by mouth every 8 (eight) hours as needed for nausea or vomiting. 07/18/23   Jannifer Rodney A, FNP  pantoprazole (PROTONIX) 40 MG tablet Take 1 tablet (40 mg total) by mouth daily. 07/18/23   Junie Spencer, FNP  Probiotic Product (PROBIOTIC PO) Take 1 capsule by mouth daily.    [provider]  tacrolimus (PROGRAF) 0.5 MG capsule Take 0.5 mg by mouth. 2 capsules in am and 3 capsules at night. 05/09/23 05/08/24  [provider]  ursodiol (ACTIGALL) 300 MG capsule Take 3 capsules (900 mg total) by mouth daily. 10/21/23   Dolores Frame, MD  vitamin C (ASCORBIC ACID) 250 MG tablet Take 250 mg by mouth daily.    [provider]  Vitamin D, Ergocalciferol, (DRISDOL) 1.25 MG (50000 UNIT) CAPS capsule TAKE 1 CAPSULE BY MOUTH ONCE A WEEK 07/09/23   Hawks, Christy A, FNP  vitamin E 180 MG (400 UNITS) capsule Take 400 Units by mouth daily.    [provider]      Allergies    Ciprofloxacin and Codeine    Review of Systems   Review of Systems  Physical Exam Updated Vital Signs BP (!) 150/84 (BP Location: Right Arm)   Pulse 91   Temp 99 F (37.2 C)   Resp 18   SpO2 96%  Physical Exam Vitals and nursing note reviewed.  Constitutional:      General: She is not in acute distress.    Appearance: She is well-developed.  HENT:     Head: Normocephalic and atraumatic.     Comments: No rashes or vesicular lesions noted     Right Ear: Tympanic membrane, ear canal and external ear normal.     Left Ear: Tympanic membrane, ear canal and external ear normal.     Ears:     Comments: No rashes or vesicular lesions noted    Nose: Nose normal.  Eyes:     Extraocular Movements: Extraocular movements intact.     Conjunctiva/sclera: Conjunctivae normal.     Pupils: Pupils are equal, round, and reactive to light.      Comments: Pupils 5 mm bilaterally.  No temporal tenderness to palpation bilaterally.  Fluorescein stain without focal uptake.  Eye pressure 10 mmHg bilaterally.  Bilateral Near: 20/20 Bilateral Distance: 20/40 R Near: 20/25 R Distance: 20/40 L Near: 20/30 L Distance: 20/40  Cardiovascular:     Rate and Rhythm: Normal rate and regular rhythm.     Heart sounds: No murmur heard. Pulmonary:     Effort: Pulmonary effort is normal. No respiratory distress.     Breath sounds: Normal breath sounds.  Musculoskeletal:     Cervical back: Normal range of motion and neck supple.     Right lower leg: No edema.     Left lower leg: No edema.  Skin:    General: Skin  is warm and dry.  Neurological:     Mental Status: She is alert and oriented to person, place, and time. Mental status is at baseline.     Comments: MENTAL STATUS: AAOx3 CRANIAL NERVES: II: Pupils equal and reactive 5 mm BL, no RAPD, no VF deficits III, IV, VI: EOM intact, no gaze preference or deviation, no nystagmus. V: normal sensation to light touch in V1, V2, and V3 segments bilaterally VII: no facial weakness or asymmetry, no nasolabial fold flattening VIII: normal hearing to speech and finger friction IX, X: normal palatal elevation, no uvular deviation XI: 5/5 head turn and 5/5 shoulder shrug bilaterally XII: midline tongue protrusion MOTOR: 5/5 strength in R shoulder flexion, elbow flexion and extension, and grip strength. 5/5 strength in L shoulder flexion, elbow flexion and extension, and grip strength.  5/5 strength in R hip and knee flexion, knee extension, ankle plantar and dorsiflexion. 5/5 strength in L hip and knee flexion, knee extension, ankle plantar and dorsiflexion. SENSORY: Normal sensation to light touch in all extremities COORD: Normal finger to nose and heel to shin, no tremor, no dysmetria  Psychiatric:        Mood and Affect: Mood normal.     ED Results / Procedures / Treatments   Labs (all labs ordered  are listed, but only abnormal results are displayed) Labs Reviewed  CBC WITH DIFFERENTIAL/PLATELET  BASIC METABOLIC PANEL  SEDIMENTATION RATE  HEPATIC FUNCTION PANEL    EKG None  Radiology No results found.  Procedures Procedures  {Document cardiac monitor, telemetry assessment procedure when appropriate:1}  Medications Ordered in ED Medications  fentaNYL (SUBLIMAZE) injection 50 mcg (has no administration in time range)  fluorescein ophthalmic strip 1 strip (1 strip Both Eyes Given 01/10/24 1957)  tetracaine (PONTOCAINE) 0.5 % ophthalmic solution 2 drop (2 drops Both Eyes Given 01/10/24 1956)    ED Course/ Medical Decision Making/ A&P   {   Click here for ABCD2, HEART and other calculatorsREFRESH Note before signing :1}                              Medical Decision Making Amount and/or Complexity of Data Reviewed Labs: ordered. Radiology: ordered.  Risk Prescription drug management.   ***  {Document critical care time when appropriate:1} {Document review of labs and clinical decision tools ie heart score, Chads2Vasc2 etc:1}  {Document your independent review of radiology images, and any outside records:1} {Document your discussion with family members, caretakers, and with consultants:1} {Document social determinants of health affecting pt's care:1} {Document your decision making why or why not admission, treatments were needed:1} Final Clinical Impression(s) / ED Diagnoses Final diagnoses:  None    Rx / DC Orders ED Discharge Orders     None

## 2024-01-10 NOTE — ED Notes (Signed)
 IV attempt unsuccessful.  Labs drawn at this time.

## 2024-01-10 NOTE — Discharge Instructions (Signed)
 You were seen for your headache in the emergency department. It may be from a condition called trigeminal neuralgia.   At home, please take tylenol and imitrex for your pain.    Check your MyChart online for the results of any tests that had not resulted by the time you left the emergency department.   Follow-up with your primary doctor in 2-3 days regarding your visit.    Return immediately to the emergency department if you experience any of the following: vision changes, or any other concerning symptoms.    Thank you for visiting our Emergency Department. It was a pleasure taking care of you today.

## 2024-01-10 NOTE — ED Triage Notes (Signed)
 Pain in right eye  and tinglining in  right side of face.  Has been on and off x a few weeks This episode which is worse today and getting worse

## 2024-01-13 ENCOUNTER — Ambulatory Visit: Payer: Medicare Other | Admitting: Family

## 2024-01-13 DIAGNOSIS — R7982 Elevated C-reactive protein (CRP): Secondary | ICD-10-CM | POA: Diagnosis not present

## 2024-01-13 DIAGNOSIS — Z881 Allergy status to other antibiotic agents status: Secondary | ICD-10-CM | POA: Diagnosis not present

## 2024-01-13 DIAGNOSIS — R519 Headache, unspecified: Secondary | ICD-10-CM | POA: Diagnosis not present

## 2024-01-13 DIAGNOSIS — B029 Zoster without complications: Secondary | ICD-10-CM | POA: Diagnosis not present

## 2024-01-13 DIAGNOSIS — E871 Hypo-osmolality and hyponatremia: Secondary | ICD-10-CM | POA: Diagnosis not present

## 2024-01-13 DIAGNOSIS — Z85828 Personal history of other malignant neoplasm of skin: Secondary | ICD-10-CM | POA: Diagnosis not present

## 2024-01-13 DIAGNOSIS — K219 Gastro-esophageal reflux disease without esophagitis: Secondary | ICD-10-CM | POA: Diagnosis not present

## 2024-01-13 DIAGNOSIS — G4700 Insomnia, unspecified: Secondary | ICD-10-CM | POA: Diagnosis not present

## 2024-01-13 DIAGNOSIS — B0230 Zoster ocular disease, unspecified: Secondary | ICD-10-CM | POA: Diagnosis not present

## 2024-01-13 DIAGNOSIS — D84821 Immunodeficiency due to drugs: Secondary | ICD-10-CM | POA: Diagnosis not present

## 2024-01-13 DIAGNOSIS — D849 Immunodeficiency, unspecified: Secondary | ICD-10-CM | POA: Diagnosis not present

## 2024-01-13 DIAGNOSIS — E039 Hypothyroidism, unspecified: Secondary | ICD-10-CM | POA: Diagnosis not present

## 2024-01-13 DIAGNOSIS — J309 Allergic rhinitis, unspecified: Secondary | ICD-10-CM | POA: Diagnosis not present

## 2024-01-13 DIAGNOSIS — S81809D Unspecified open wound, unspecified lower leg, subsequent encounter: Secondary | ICD-10-CM | POA: Diagnosis not present

## 2024-01-13 DIAGNOSIS — L089 Local infection of the skin and subcutaneous tissue, unspecified: Secondary | ICD-10-CM | POA: Diagnosis not present

## 2024-01-13 DIAGNOSIS — Z886 Allergy status to analgesic agent status: Secondary | ICD-10-CM | POA: Diagnosis not present

## 2024-01-13 DIAGNOSIS — Z79623 Long term (current) use of mammalian target of rapamycin (mtor) inhibitor: Secondary | ICD-10-CM | POA: Diagnosis not present

## 2024-01-13 DIAGNOSIS — Z1509 Genetic susceptibility to other malignant neoplasm: Secondary | ICD-10-CM | POA: Diagnosis not present

## 2024-01-13 DIAGNOSIS — I10 Essential (primary) hypertension: Secondary | ICD-10-CM | POA: Diagnosis not present

## 2024-01-13 DIAGNOSIS — L97912 Non-pressure chronic ulcer of unspecified part of right lower leg with fat layer exposed: Secondary | ICD-10-CM | POA: Diagnosis not present

## 2024-01-13 DIAGNOSIS — Z79899 Other long term (current) drug therapy: Secondary | ICD-10-CM | POA: Diagnosis not present

## 2024-01-13 DIAGNOSIS — R7401 Elevation of levels of liver transaminase levels: Secondary | ICD-10-CM | POA: Diagnosis not present

## 2024-01-13 DIAGNOSIS — M60861 Other myositis, right lower leg: Secondary | ICD-10-CM | POA: Diagnosis not present

## 2024-01-13 DIAGNOSIS — Z885 Allergy status to narcotic agent status: Secondary | ICD-10-CM | POA: Diagnosis not present

## 2024-01-13 DIAGNOSIS — M810 Age-related osteoporosis without current pathological fracture: Secondary | ICD-10-CM | POA: Diagnosis not present

## 2024-01-13 DIAGNOSIS — T8649 Other complications of liver transplant: Secondary | ICD-10-CM | POA: Diagnosis not present

## 2024-01-13 DIAGNOSIS — Z923 Personal history of irradiation: Secondary | ICD-10-CM | POA: Diagnosis not present

## 2024-01-13 DIAGNOSIS — Z944 Liver transplant status: Secondary | ICD-10-CM | POA: Diagnosis not present

## 2024-01-13 DIAGNOSIS — R600 Localized edema: Secondary | ICD-10-CM | POA: Diagnosis not present

## 2024-01-13 DIAGNOSIS — Z85038 Personal history of other malignant neoplasm of large intestine: Secondary | ICD-10-CM | POA: Diagnosis not present

## 2024-01-13 DIAGNOSIS — K831 Obstruction of bile duct: Secondary | ICD-10-CM | POA: Diagnosis not present

## 2024-01-13 DIAGNOSIS — Z7983 Long term (current) use of bisphosphonates: Secondary | ICD-10-CM | POA: Diagnosis not present

## 2024-01-13 DIAGNOSIS — Z789 Other specified health status: Secondary | ICD-10-CM | POA: Diagnosis not present

## 2024-01-15 ENCOUNTER — Encounter: Payer: Self-pay | Admitting: Family

## 2024-01-21 ENCOUNTER — Telehealth: Payer: Self-pay | Admitting: *Deleted

## 2024-01-21 NOTE — Transitions of Care (Post Inpatient/ED Visit) (Signed)
   01/21/2024  Name: Cassidy Barber MRN: [id number] DOB: 21-Nov-1950  Today's TOC FU Call Status: Today's TOC FU Call Status:: Unsuccessful Call (1st Attempt) Unsuccessful Call (1st Attempt) Date: 01/21/24  Attempted to reach the patient regarding the most recent Inpatient/ED visit.  Follow Up Plan: Additional outreach attempts will be made to reach the patient to complete the Transitions of Care (Post Inpatient/ED visit) call.   Irving Shows [facility], BSN RN Care Manager/ Transition of Care [place]/ [facility] [phone]

## 2024-01-22 ENCOUNTER — Telehealth: Payer: Self-pay | Admitting: *Deleted

## 2024-01-22 NOTE — Transitions of Care (Post Inpatient/ED Visit) (Signed)
   01/22/2024  Name: ALETTE KATAOKA MRN: [id number] DOB: [birth year]  Today's TOC FU Call Status: Today's TOC FU Call Status:: Unsuccessful Call (2nd Attempt) Unsuccessful Call (2nd Attempt) Date: 01/22/24  Attempted to reach the patient regarding the most recent Inpatient/ED visit.  Follow Up Plan: Additional outreach attempts will be made to reach the patient to complete the Transitions of Care (Post Inpatient/ED visit) call.   Gean Maidens BSN RN [facility] Health Care Management Coordinator [email or web address] Direct Dial: [phone]  Fax: [phone] Website: [email or web address]

## 2024-01-23 ENCOUNTER — Telehealth: Payer: Self-pay

## 2024-01-23 NOTE — Transitions of Care (Post Inpatient/ED Visit) (Signed)
   01/23/2024  Name: Cassidy Barber MRN: [id number] DOB: 09-Feb-1951  Today's TOC FU Call Status: Today's TOC FU Call Status:: Unsuccessful Call (3rd Attempt) Unsuccessful Call (3rd Attempt) Date: 01/23/24  Attempted to reach the patient regarding the most recent Inpatient/ED visit.  Note: Followed by [facility] [place] s/p liver transplant. PCP appointment scheduled for 01/28/24.   Follow Up Plan: No further outreach attempts will be made at this time. We have been unable to contact the patient.   Gabriel Cirri MSN, RN RN Case [employer] executive Health  [facility] Office Hours Wed/Thur  8:00 am-6:00 pm Direct Dial: [phone] Main Phone [phone]  Fax: [phone] [email or web address]

## 2024-01-24 ENCOUNTER — Inpatient Hospital Stay: Admitting: Family Medicine

## 2024-01-28 ENCOUNTER — Encounter: Payer: Self-pay | Admitting: Nurse Practitioner

## 2024-01-28 ENCOUNTER — Ambulatory Visit (INDEPENDENT_AMBULATORY_CARE_PROVIDER_SITE_OTHER): Admitting: Nurse Practitioner

## 2024-01-28 VITALS — BP 136/86 | HR 90 | Temp 98.4°F | Ht 64.0 in | Wt 139.2 lb

## 2024-01-28 DIAGNOSIS — Z09 Encounter for follow-up examination after completed treatment for conditions other than malignant neoplasm: Secondary | ICD-10-CM | POA: Diagnosis not present

## 2024-01-28 DIAGNOSIS — Z944 Liver transplant status: Secondary | ICD-10-CM | POA: Diagnosis not present

## 2024-01-28 DIAGNOSIS — B027 Disseminated zoster: Secondary | ICD-10-CM | POA: Diagnosis not present

## 2024-01-28 NOTE — Patient Instructions (Signed)
 Shingles  Shingles, or herpes zoster, is an infection. It gives you a skin rash and blisters. These infected areas may hurt a lot. Shingles only happens if: You've had chickenpox. You've been given a shot called a vaccine to protect you from getting chickenpox. Shingles is rare in this case. What are the causes? Shingles is caused by a germ called the varicella-zoster virus. This is the same germ that causes chickenpox. After you're exposed to the germ, it stays in your body but is dormant. This means it isn't active. Shingles happens if the germ becomes active again. This can happen years after you're first exposed to the germ. What increases the risk? You may be more likely to get shingles if: You're older than 73 years of age. You're under a lot of stress. You have a weak immune system. The immune system is your body's defense system. It may be weak if: You have human immunodeficiency virus (HIV). You have acquired immunodeficiency syndrome (AIDS). You have cancer. You take medicines that weaken your immune system. These include organ transplant medicines. What are the signs or symptoms? The first symptoms of shingles may be itching, tingling, or pain. Your skin may feel like it's burning. A few days or weeks later, you'll get a rash. Here's what you can expect: The rash is likely to be on one side of your body. The rash may be shaped like a belt or a band. Over time, it will turn into blisters filled with fluid. The blisters will break open and change into scabs. The scabs will dry up in about 2-3 weeks. You may also have: A fever. Chills. A headache. Nausea. How is this diagnosed? Shingles is diagnosed with a skin exam. A sample called a culture may be taken from one of your blisters and sent to a lab. This will show if you have shingles. How is this treated? The rash may last for several weeks. There's no cure for shingles, but your health care provider may give you medicines.  These medicines may: Help with pain. Help with itching. Help with irritation and swelling. Help you get better sooner. Help to prevent long-term problems. If the rash is on your face, you may need to see an eye doctor or an ear, nose, and throat (ENT) doctor. Follow these instructions at home: Medicines Take your medicines only as told by your provider. Put an anti-itch cream or numbing cream on the rash or blisters as told by your provider. Relieving itching and discomfort  To help with itching: Put cold, wet cloths called cold compresses on the rash or blisters. Take a cool bath. Try adding baking soda or dry oatmeal to the water. Do not bathe in hot water. Use calamine lotion on the rash or blisters. You can get this type of lotion at the store. Blister and rash care Keep your rash covered with a loose bandage. Wear loose clothes that don't rub on your rash. Take care of your rash as told by your provider. Make sure you: Wash your hands with soap and water for at least 20 seconds before and after you change your bandage. If you can't use soap and water, use hand sanitizer. Keep your rash and blisters clean by washing them with mild soap and cool water. Change your bandage. Check your rash every day for signs of infection. Check for: More redness, swelling, or pain. Fluid or blood. Warmth. Pus or a bad smell. Do not scratch your rash. Do not pick at your  blisters. To help you not scratch: Keep your fingernails clean and cut short. Try to wear gloves or mittens when you sleep. General instructions Rest. Wash your hands often with soap and water for at least 20 seconds. If you can't use soap and water, use hand sanitizer. Washing your hands lowers your chance of getting a skin infection. Your infection can cause chickenpox in others. If you have blisters that aren't scabs yet, stay away from: Babies. Pregnant people. Children who have eczema. Older people who have organ  transplants. People who have a long-term, or chronic, illness. Anyone who hasn't had chickenpox before. Anyone who hasn't gotten the chickenpox vaccine. How is this prevented? Vaccines are the best way to prevent you from getting chickenpox or shingles. Talk with your provider about getting these shots. Where to find more information Centers for Disease Control and Prevention (CDC): [employer].no Contact a health care provider if: Your pain doesn't get better with medicine. Your pain doesn't get better after the rash heals. You have any signs of infection around the rash. Your rash or blisters get worse. You have a fever or chills. Get help right away if: The rash is on your face or nose. You have pain in your face or by your eye. You lose feeling on one side of your face. You have trouble seeing. You have ear pain or ringing in your ear. This information is not intended to replace advice given to you by your health care provider. Make sure you discuss any questions you have with your health care provider. Document Revised: 08/01/2023 Document Reviewed: 12/14/2022 Elsevier Patient Education  2024 [employer].

## 2024-01-28 NOTE — Progress Notes (Signed)
 Subjective:    Patient ID: Cassidy Barber, female    DOB: 30-Sep-1951, 73 y.o.   MRN: [id number]   Chief Complaint: Hospital follow up  HPI Patient went to ed and was admitted for headache. She was dx with shingles around eye and cellulitis of lower ext. She was treated with valtrex and antibiotics in hospital. Discharged on 01/20/24. Since discharged she has been doing well. Still having pain, burning and itching on right side of face. Vision has not been affected. Patient Active Problem List   Diagnosis Date Noted   Open scalp wound 03/29/2023   Purpura senilis (HCC) 06/08/2022   GAVE (gastric antral vascular ectasia) 05/21/2022   Positive urine culture 09/05/2021   Elevated alkaline phosphatase level 02/02/2021   S/P reverse total shoulder arthroplasty, left 11/12/2019   AKI (acute kidney injury) (HCC) 10/05/2019   Anterior dislocation of left shoulder 09/26/2019   Hx of colonic polyps 09/04/2019   Chest pain 09/04/2019   Closed fracture of shaft of right ulna 07/08/2019   Fracture of shaft of ulna 07/03/2019   Benzodiazepine dependence (HCC) 04/13/2019   Controlled substance agreement signed 04/13/2019   S/p reverse total shoulder arthroplasty 07/17/2018   History of colon cancer 06/12/2018   History of colonic polyps 06/12/2018   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   Gastroesophageal reflux disease 06/12/2018   Rotator cuff tear arthropathy 05/28/2018   Hypertension 04/08/2018   Chronic diarrhea 06/26/2017   Iron deficiency anemia 06/26/2017   GAD (generalized anxiety disorder) 08/28/2016   Vitamin D deficiency 08/28/2016   Insomnia 08/28/2016   Immunosuppression (HCC) 12/02/2015   Seizures (HCC) 09/27/2015   Hx of liver transplant (HCC) 12/08/2014   Colon cancer (HCC) 12/08/2014   Portal vein thrombosis 11/19/2013   Hepatic encephalopathy (HCC) 10/26/2013   Cirrhosis (HCC) 01/23/2013   Incisional hernia 01/23/2013   History of colonic polyps 12/27/2012   Primary biliary  cholangitis (HCC) 03/24/2012   Hypothyroidism 03/24/2012   Ventral hernia, recurrent 03/24/2012   Hernia 09/04/2011   Other secondary pulmonary hypertension (HCC) 08/16/2011       Review of Systems  Constitutional:  Negative for diaphoresis.  Eyes:  Negative for pain.  Respiratory:  Negative for shortness of breath.   Cardiovascular:  Negative for chest pain, palpitations and leg swelling.  Gastrointestinal:  Negative for abdominal pain.  Endocrine: Negative for polydipsia.  Skin:  Negative for rash.  Neurological:  Negative for dizziness, weakness and headaches.  Hematological:  Does not bruise/bleed easily.  All other systems reviewed and are negative.      Objective:   Physical Exam Constitutional:      Appearance: Normal appearance.  Cardiovascular:     Rate and Rhythm: Normal rate and regular rhythm.     Heart sounds: Normal heart sounds.  Pulmonary:     Effort: Pulmonary effort is normal.     Breath sounds: Normal breath sounds.  Skin:    General: Skin is warm.     Comments: Cleared lesions around right eye- just remaining erythema   Neurological:     General: No focal deficit present.     Mental Status: She is alert and oriented to person, place, and time.  Psychiatric:        Mood and Affect: Mood normal.        Behavior: Behavior normal.    BP 136/86   Pulse 90   Temp 98.4 F (36.9 C) (Skin)   Ht 5\' 4"  (1.626 m)   Wt 139 lb  3.2 oz (63.1 kg)   BMI 23.89 kg/m         Assessment & Plan:   Cassidy Barber in today with chief complaint of No chief complaint on file.   1. Liver transplant recipient [facility]) (Primary) Labs pending  2. Disseminated herpes zoster Avoid rubbing or scratching area Cool compresses Needs shingles vaccine in 6 months  3. Hospital discharge follow-up Hospital records reviewed    The above assessment and management plan was discussed with the patient. The patient verbalized understanding of and has agreed to the  management plan. Patient is aware to call the clinic if symptoms persist or worsen. Patient is aware when to return to the clinic for a follow-up visit. Patient educated on when it is appropriate to go to the emergency department.   Mary-Margaret Daphine Deutscher, FNP

## 2024-01-30 LAB — CMP14+EGFR
ALT: 17 IU/L (ref 0–32)
AST: 31 IU/L (ref 0–40)
Albumin: 4.1 g/dL (ref 3.8–4.8)
Alkaline Phosphatase: 295 IU/L — ABNORMAL HIGH (ref 44–121)
BUN/Creatinine Ratio: 23 (ref 12–28)
BUN: 17 mg/dL (ref 8–27)
Bilirubin Total: 0.4 mg/dL (ref 0.0–1.2)
CO2: 21 mmol/L (ref 20–29)
Calcium: 9.6 mg/dL (ref 8.7–10.3)
Chloride: 100 mmol/L (ref 96–106)
Creatinine, Ser: 0.75 mg/dL (ref 0.57–1.00)
Globulin, Total: 3.5 g/dL (ref 1.5–4.5)
Glucose: 95 mg/dL (ref 70–99)
Potassium: 5.1 mmol/L (ref 3.5–5.2)
Sodium: 136 mmol/L (ref 134–144)
Total Protein: 7.6 g/dL (ref 6.0–8.5)
eGFR: 85 mL/min/{1.73_m2} (ref >59)

## 2024-01-30 LAB — CBC WITH DIFFERENTIAL/PLATELET
Basophils Absolute: 0.2 10*3/uL (ref 0.0–0.2)
Basos: 2 %
EOS (ABSOLUTE): 0.4 10*3/uL (ref 0.0–0.4)
Eos: 4 %
Hematocrit: 43.9 % (ref 34.0–46.6)
Hemoglobin: 14.3 g/dL (ref 11.1–15.9)
Immature Grans (Abs): 0 10*3/uL (ref 0.0–0.1)
Immature Granulocytes: 0 %
Lymphocytes Absolute: 3.2 10*3/uL — ABNORMAL HIGH (ref 0.7–3.1)
Lymphs: 35 %
MCH: 30.4 pg (ref 26.6–33.0)
MCHC: 32.6 g/dL (ref 31.5–35.7)
MCV: 93 fL (ref 79–97)
Monocytes Absolute: 0.9 10*3/uL (ref 0.1–0.9)
Monocytes: 10 %
Neutrophils Absolute: 4.6 10*3/uL (ref 1.4–7.0)
Neutrophils: 49 %
Platelets: 453 10*3/uL — ABNORMAL HIGH (ref 150–450)
RBC: 4.71 x10E6/uL (ref 3.77–5.28)
RDW: 12.7 % (ref 11.7–15.4)
WBC: 9.2 10*3/uL (ref 3.4–10.8)

## 2024-01-30 LAB — SIROLIMUS LEVEL: Sirolimus, Blood: 5.8 ng/mL (ref 3.0–20.0)

## 2024-01-31 ENCOUNTER — Encounter (INDEPENDENT_AMBULATORY_CARE_PROVIDER_SITE_OTHER): Payer: Self-pay | Admitting: *Deleted

## 2024-02-03 ENCOUNTER — Other Ambulatory Visit: Payer: Self-pay | Admitting: Family

## 2024-02-06 ENCOUNTER — Encounter: Payer: Self-pay | Admitting: Family

## 2024-02-06 ENCOUNTER — Telehealth (INDEPENDENT_AMBULATORY_CARE_PROVIDER_SITE_OTHER): Admitting: Family

## 2024-02-06 DIAGNOSIS — J019 Acute sinusitis, unspecified: Secondary | ICD-10-CM | POA: Diagnosis not present

## 2024-02-06 DIAGNOSIS — B0222 Postherpetic trigeminal neuralgia: Secondary | ICD-10-CM | POA: Diagnosis not present

## 2024-02-06 MED ORDER — VALACYCLOVIR HCL 1 G PO TABS: 1000.0000 mg | ORAL_TABLET | Freq: Two times a day (BID) | ORAL | 0 refills | Status: AC

## 2024-02-06 MED ORDER — DOXYCYCLINE HYCLATE 100 MG PO TABS: 100.0000 mg | ORAL_TABLET | Freq: Two times a day (BID) | ORAL | 0 refills | Status: DC

## 2024-02-06 MED ORDER — PROMETHAZINE-DM 6.25-15 MG/5ML PO SYRP: 5.0000 mL | ORAL_SOLUTION | Freq: Three times a day (TID) | ORAL | 0 refills | Status: DC | PRN

## 2024-02-06 NOTE — Progress Notes (Signed)
 Virtual Visit Consent   Cassidy Barber, you are scheduled for a virtual visit with a [facility] provider today. Just as with appointments in the office, your consent must be obtained to participate. Your consent will be active for this visit and any virtual visit you may have with one of our providers in the next 365 days. If you have a MyChart account, a copy of this consent can be sent to you electronically.  As this is a virtual visit, video technology does not allow for your provider to perform a traditional examination. This may limit your provider's ability to fully assess your condition. If your provider identifies any concerns that need to be evaluated in person or the need to arrange testing (such as labs, EKG, etc.), we will make arrangements to do so. Although advances in technology are sophisticated, we cannot ensure that it will always work on either your end or our end. If the connection with a video visit is poor, the visit may have to be switched to a telephone visit. With either a video or telephone visit, we are not always able to ensure that we have a secure connection.  By engaging in this virtual visit, you consent to the provision of healthcare and authorize for your insurance to be billed (if applicable) for the services provided during this visit. Depending on your insurance coverage, you may receive a charge related to this service.  I need to obtain your verbal consent now. Are you willing to proceed with your visit today? TERENCE GOOGE has provided verbal consent on 02/06/2024 for a virtual visit (video or telephone). Jannifer Rodney, FNP  Date: 02/06/2024 9:34 AM   Virtual Visit via Video Note   I, Jannifer Rodney, connected with  Cassidy Barber  ([id number], 1951-10-25) on 02/06/24 at  9:10 AM EDT by a video-enabled telemedicine application and verified that I am speaking with the correct person using two identifiers.  Location: Patient: Virtual Visit Location Patient:  Home Provider: Virtual Visit Location Provider: Home Office   I discussed the limitations of evaluation and management by telemedicine and the availability of in person appointments. The patient expressed understanding and agreed to proceed.    History of Present Illness: Cassidy Barber is a 73 y.o. who identifies as a female who was assigned female at birth, and is being seen today for URI symptoms that started over a week ago.  She has also had shingles for the last 5 weeks. She was hospitalized for a week. Reports she continues to have lesions on her eyebrows   HPI: Sinusitis This is a new problem. The current episode started 1 to 4 weeks ago. The problem has been gradually worsening since onset. Associated symptoms include congestion, coughing, headaches, a hoarse voice and sinus pressure. Pertinent negatives include no sore throat. Past treatments include oral decongestants and acetaminophen. The treatment provided mild relief.    Problems:  Patient Active Problem List   Diagnosis Date Noted   Open scalp wound 03/29/2023   Purpura senilis (HCC) 06/08/2022   GAVE (gastric antral vascular ectasia) 05/21/2022   Positive urine culture 09/05/2021   Elevated alkaline phosphatase level 02/02/2021   S/P reverse total shoulder arthroplasty, left 11/12/2019   AKI (acute kidney injury) (HCC) 10/05/2019   Anterior dislocation of left shoulder 09/26/2019   Hx of colonic polyps 09/04/2019   Chest pain 09/04/2019   Closed fracture of shaft of right ulna 07/08/2019   Fracture of shaft of ulna 07/03/2019  Benzodiazepine dependence (HCC) 04/13/2019   Controlled substance agreement signed 04/13/2019   S/p reverse total shoulder arthroplasty 07/17/2018   History of colon cancer 06/12/2018   History of colonic polyps 06/12/2018   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   Gastroesophageal reflux disease 06/12/2018   Rotator cuff tear arthropathy 05/28/2018   Hypertension 04/08/2018   Chronic diarrhea  06/26/2017   Iron deficiency anemia 06/26/2017   GAD (generalized anxiety disorder) 08/28/2016   Vitamin D deficiency 08/28/2016   Insomnia 08/28/2016   Immunosuppression (HCC) 12/02/2015   Seizures (HCC) 09/27/2015   Hx of liver transplant (HCC) 12/08/2014   Colon cancer (HCC) 12/08/2014   Portal vein thrombosis 11/19/2013   Hepatic encephalopathy (HCC) 10/26/2013   Cirrhosis (HCC) 01/23/2013   Incisional hernia 01/23/2013   History of colonic polyps 12/27/2012   Primary biliary cholangitis (HCC) 03/24/2012   Hypothyroidism 03/24/2012   Ventral hernia, recurrent 03/24/2012   Hernia 09/04/2011   Other secondary pulmonary hypertension (HCC) 08/16/2011    Allergies:  Allergies  Allergen Reactions   Ciprofloxacin Itching   Codeine Nausea Only   Medications:  Current Outpatient Medications:    doxycycline (VIBRA-TABS) 100 MG tablet, Take 1 tablet (100 mg total) by mouth 2 (two) times daily., Disp: 20 tablet, Rfl: 0   promethazine-dextromethorphan (PROMETHAZINE-DM) 6.25-15 MG/5ML syrup, Take 5 mLs by mouth 3 (three) times daily as needed for cough., Disp: 240 mL, Rfl: 0   valACYclovir (VALTREX) 1000 MG tablet, Take 1 tablet (1,000 mg total) by mouth 2 (two) times daily for 10 days., Disp: 20 tablet, Rfl: 0   acetaminophen (TYLENOL) 500 MG tablet, Take 1,000 mg by mouth every 6 (six) hours as needed (for pain.). , Disp: , Rfl:    alendronate (FOSAMAX) 70 MG tablet, TAKE 1 TABLET EVERY WEEK, Disp: 12 tablet, Rfl: 2   ALPRAZolam (XANAX) 0.5 MG tablet, Take 1 tablet (0.5 mg total) by mouth at bedtime., Disp: 90 tablet, Rfl: 1   Biotin w/ Vitamins C & E (HAIR SKIN & NAILS GUMMIES PO), Take 2 tablets by mouth daily., Disp: , Rfl:    Calcium Carb-Cholecalciferol (CALCIUM 600 + D PO), Take 1 tablet by mouth 2 (two) times daily., Disp: , Rfl:    cetirizine (ZYRTEC) 10 MG tablet, Take 1 tablet (10 mg total) by mouth daily as needed for allergies., Disp: 90 tablet, Rfl: 3   Cholecalciferol  (VITAMIN D) 50 MCG (2000 UT) tablet, Take 2,000 Units by mouth daily., Disp: , Rfl:    Clobetasol Prop Emollient Base (CLOBETASOL PROPIONATE E) 0.05 % emollient cream, Apply 1 application. topically 2 (two) times daily. (Patient taking differently: Apply 1 application  topically 2 (two) times daily as needed (rash).), Disp: 180 g, Rfl: 1   clotrimazole-betamethasone (LOTRISONE) cream, Apply 1 application topically 2 (two) times daily. (Patient taking differently: Apply 1 application  topically daily as needed (Rash).), Disp: 30 g, Rfl: 0   famotidine (PEPCID) 20 MG tablet, Take 1 tablet (20 mg total) by mouth 2 (two) times daily., Disp: 60 tablet, Rfl: 5   fluorouracil (EFUDEX) 5 % cream, Apply 1 application topically daily as needed (cancer spots). , Disp: , Rfl: 0   fluticasone (FLONASE) 50 MCG/ACT nasal spray, Place 2 sprays into both nostrils daily., Disp: 16 g, Rfl: 6   gabapentin (NEURONTIN) 100 MG capsule, Take 1 capsule (100 mg total) by mouth 3 (three) times daily as needed., Disp: 90 capsule, Rfl: 3   HYDROcodone-acetaminophen (NORCO/VICODIN) 5-325 MG tablet, Take 1 tablet by mouth every 4 (  four) hours as needed., Disp: , Rfl:    hydrocortisone cream 1 %, Apply 1 Application topically 2 (two) times daily as needed for itching., Disp: , Rfl:    ketoconazole (NIZORAL) 2 % cream, Apply 1 Application topically 2 (two) times daily., Disp: 15 g, Rfl: 0   levothyroxine (SYNTHROID) 112 MCG tablet, TAKE ONE (1) TABLET BY MOUTH EVERY DAY, Disp: 90 tablet, Rfl: 2   losartan (COZAAR) 25 MG tablet, Take 1 tablet (25 mg total) by mouth daily., Disp: 90 tablet, Rfl: 4   metoprolol succinate (TOPROL-XL) 25 MG 24 hr tablet, Take 1 tablet (25 mg total) by mouth daily., Disp: 90 tablet, Rfl: 1   Multiple Vitamins-Minerals (MULTIVITAMIN WITH MINERALS) tablet, Take 1 tablet by mouth daily., Disp: , Rfl:    mupirocin ointment (BACTROBAN) 2 %, Apply 1 application topically daily as needed (Cancer spots)., Disp: 22  g, Rfl: 3   ondansetron (ZOFRAN) 4 MG tablet, Take 1 tablet (4 mg total) by mouth every 8 (eight) hours as needed for nausea or vomiting., Disp: 90 tablet, Rfl: 1   pantoprazole (PROTONIX) 40 MG tablet, Take 1 tablet (40 mg total) by mouth daily., Disp: 90 tablet, Rfl: 1   Probiotic Product (PROBIOTIC PO), Take 1 capsule by mouth daily., Disp: , Rfl:    SUMAtriptan (IMITREX) 50 MG tablet, Take 1 tablet (50 mg total) by mouth every 2 (two) hours as needed for migraine. May repeat in 2 hours if headache persists or recurs., Disp: 10 tablet, Rfl: 0   tacrolimus (PROGRAF) 0.5 MG capsule, Take 0.5 mg by mouth. 2 capsules in am and 3 capsules at night., Disp: , Rfl:    ursodiol (ACTIGALL) 300 MG capsule, Take 3 capsules (900 mg total) by mouth daily., Disp: 90 capsule, Rfl: 5   vitamin C (ASCORBIC ACID) 250 MG tablet, Take 250 mg by mouth daily., Disp: , Rfl:    Vitamin D, Ergocalciferol, 50000 units CAPS, TAKE 1 CAPSULE BY MOUTH ONCE A WEEK, Disp: 12 capsule, Rfl: 1   vitamin E 180 MG (400 UNITS) capsule, Take 400 Units by mouth daily., Disp: , Rfl:   Observations/Objective: Patient is well-developed, well-nourished in no acute distress.  Resting comfortably  at home.  Head is normocephalic, atraumatic.  No labored breathing.  Speech is clear and coherent with logical content.  Patient is alert and oriented at baseline.  Right face swelling, skin lesion right forehead and eyebrow  Assessment and Plan: 1. Acute sinusitis, recurrence not specified, unspecified location (Primary) - doxycycline (VIBRA-TABS) 100 MG tablet; Take 1 tablet (100 mg total) by mouth 2 (two) times daily.  Dispense: 20 tablet; Refill: 0 - promethazine-dextromethorphan (PROMETHAZINE-DM) 6.25-15 MG/5ML syrup; Take 5 mLs by mouth 3 (three) times daily as needed for cough.  Dispense: 240 mL; Refill: 0  2. Trigeminal herpes zoster - valACYclovir (VALTREX) 1000 MG tablet; Take 1 tablet (1,000 mg total) by mouth 2 (two) times daily  for 10 days.  Dispense: 20 tablet; Refill: 0  - Take meds as prescribed - Use a cool mist humidifier  -Use saline nose sprays frequently -Force fluids -For any cough or congestion  Use plain Mucinex- regular strength or max strength is fine -For fever or aces or pains- take tylenol or ibuprofen. -Throat lozenges if help   Recommend her calling her ophthalmologists for follow up for shingles Start doxycyline for sinusitis  Will renew shingles given continue lesions that are not crusted   Follow up if symptoms worsen or do not improve  Follow Up Instructions: I discussed the assessment and treatment plan with the patient. The patient was provided an opportunity to ask questions and all were answered. The patient agreed with the plan and demonstrated an understanding of the instructions.  A copy of instructions were sent to the patient via MyChart unless otherwise noted below.    The patient was advised to call back or seek an in-person evaluation if the symptoms worsen or if the condition fails to improve as anticipated.    Jannifer Rodney, FNP

## 2024-02-13 DIAGNOSIS — D849 Immunodeficiency, unspecified: Secondary | ICD-10-CM | POA: Diagnosis not present

## 2024-02-13 DIAGNOSIS — Z944 Liver transplant status: Secondary | ICD-10-CM | POA: Diagnosis not present

## 2024-02-14 ENCOUNTER — Telehealth: Admitting: Physician Assistant

## 2024-02-14 DIAGNOSIS — B9689 Other specified bacterial agents as the cause of diseases classified elsewhere: Secondary | ICD-10-CM

## 2024-02-14 DIAGNOSIS — J019 Acute sinusitis, unspecified: Secondary | ICD-10-CM | POA: Diagnosis not present

## 2024-02-14 MED ORDER — AZITHROMYCIN 250 MG PO TABS: ORAL_TABLET | ORAL | 0 refills | Status: DC

## 2024-02-14 NOTE — Progress Notes (Signed)

## 2024-02-18 ENCOUNTER — Ambulatory Visit (INDEPENDENT_AMBULATORY_CARE_PROVIDER_SITE_OTHER): Admitting: Family

## 2024-02-18 ENCOUNTER — Encounter: Payer: Self-pay | Admitting: Family

## 2024-02-18 VITALS — BP 142/80 | HR 87 | Temp 98.0°F | Ht 64.0 in | Wt 141.6 lb

## 2024-02-18 DIAGNOSIS — J0101 Acute recurrent maxillary sinusitis: Secondary | ICD-10-CM | POA: Diagnosis not present

## 2024-02-18 DIAGNOSIS — B027 Disseminated zoster: Secondary | ICD-10-CM

## 2024-02-18 MED ORDER — PREDNISONE 10 MG PO TABS: ORAL_TABLET | ORAL | 0 refills | Status: DC

## 2024-02-18 MED ORDER — HYDROXYZINE PAMOATE 25 MG PO CAPS: 25.0000 mg | ORAL_CAPSULE | Freq: Four times a day (QID) | ORAL | 0 refills | Status: DC | PRN

## 2024-02-18 NOTE — Progress Notes (Signed)
 Subjective:    Patient ID: Cassidy Barber, female    DOB: 1951/06/16, 73 y.o.   MRN: [id number]  Chief Complaint  Patient presents with   Sinusitis   PT presents to the office today with recurrent sinusitis. She has taken Augmentin, Zpak, and doxycyline.   She had  shingles for the last 7 weeks. She was hospitalized for a week. Her lesions are improving, but having itching.  Sinusitis This is a recurrent problem. The current episode started more than 1 month ago. The problem has been waxing and waning since onset. There has been no fever. Her pain is at a severity of 3/10. The pain is mild. Associated symptoms include congestion, coughing, ear pain, headaches, a hoarse voice, shortness of breath, sinus pressure and sneezing. Pertinent negatives include no sore throat or swollen glands. Past treatments include oral decongestants and nasal decongestants. The treatment provided mild relief.      Review of Systems  HENT:  Positive for congestion, ear pain, hoarse voice, sinus pressure and sneezing. Negative for sore throat.   Respiratory:  Positive for cough and shortness of breath.   Neurological:  Positive for headaches.  All other systems reviewed and are negative.   Social History   Socioeconomic History   Marital status: Married    Spouse name: Johnny   Number of children: 1   Years of education: 12   Highest education level: 12th grade  Occupational History   Occupation: Disability    Employer: [employer]    Comment: Immunologist  Tobacco Use   Smoking status: Never   Smokeless tobacco: Never  Vaping Use   Vaping status: Never Used  Substance and Sexual Activity   Alcohol use: No    Alcohol/week: 0.0 standard drinks of alcohol   Drug use: No   Sexual activity: Yes    Birth control/protection: None  Other Topics Concern   Not on file  Social History Narrative   Patient lives in a two story home with her husband. She has an adult son. She is retired  from being an Environmental health practitioner for 30 years.    Social Drivers of Corporate investment banker Strain: Low Risk  (01/11/2024)   Overall Financial Resource Strain (CARDIA)    Difficulty of Paying Living Expenses: Not hard at all  Food Insecurity: No Food Insecurity (01/11/2024)   Hunger Vital Sign    Worried About Running Out of Food in the Last Year: Never true    Ran Out of Food in the Last Year: Never true  Transportation Needs: No Transportation Needs (01/11/2024)   PRAPARE - Administrator, Civil Service (Medical): No    Lack of Transportation (Non-Medical): No  Physical Activity: Insufficiently Active (01/11/2024)   Exercise Vital Sign    Days of Exercise per Week: 1 day    Minutes of Exercise per Session: 20 min  Stress: No Stress Concern Present (01/11/2024)   [employer] of Occupational Health - Occupational Stress Questionnaire    Feeling of Stress : Not at all  Social Connections: Socially Integrated (01/11/2024)   Social Connection and Isolation Panel [NHANES]    Frequency of Communication with Friends and Family: Once a week    Frequency of Social Gatherings with Friends and Family: Three times a week    Attends Religious Services: More than 4 times per year    Active Member of Clubs or Organizations: No    Attends Banker Meetings: More than  4 times per year    Marital Status: Married   Family History  Problem Relation Age of Onset   Prostate cancer Father    Colon cancer Father    Colon cancer Sister    Lung cancer Sister    Healthy Son    Alcohol abuse Brother    Allergic rhinitis Neg Hx    Asthma Neg Hx    Eczema Neg Hx    Urticaria Neg Hx         Objective:   Physical Exam Vitals reviewed.  Constitutional:      General: She is not in acute distress.    Appearance: She is well-developed.  HENT:     Head: Normocephalic and atraumatic.     Right Ear: There is impacted cerumen.     Left Ear: Tympanic membrane normal.      Nose: Congestion present.     Right Sinus: Maxillary sinus tenderness present.     Left Sinus: Maxillary sinus tenderness present.  Eyes:     Pupils: Pupils are equal, round, and reactive to light.  Neck:     Thyroid: No thyromegaly.  Cardiovascular:     Rate and Rhythm: Normal rate and regular rhythm.     Heart sounds: Normal heart sounds. No murmur heard. Pulmonary:     Effort: Pulmonary effort is normal. No respiratory distress.     Breath sounds: Normal breath sounds. No wheezing.  Abdominal:     General: Bowel sounds are normal. There is no distension.     Palpations: Abdomen is soft.     Tenderness: There is no abdominal tenderness.  Musculoskeletal:        General: No tenderness. Normal range of motion.     Cervical back: Normal range of motion and neck supple.  Skin:    General: Skin is warm and dry.  Neurological:     Mental Status: She is alert and oriented to person, place, and time.     Cranial Nerves: No cranial nerve deficit.     Deep Tendon Reflexes: Reflexes are normal and symmetric.  Psychiatric:        Behavior: Behavior normal.        Thought Content: Thought content normal.        Judgment: Judgment normal.       BP (!) 142/80   Pulse 87   Temp 98 F (36.7 C) (Temporal)   Ht 5\' 4"  (1.626 m)   Wt 141 lb 9.6 oz (64.2 kg)   SpO2 97%   BMI 24.31 kg/m      Assessment & Plan:  ARALY KAAS comes in today with chief complaint of Sinusitis   Diagnosis and orders addressed:  1. Acute recurrent maxillary sinusitis (Primary) - Take meds as prescribed - Use a cool mist humidifier  -Use saline nose sprays frequently -Force fluids -For any cough or congestion  Use plain Mucinex- regular strength or max strength is fine -For fever or aces or pains- take tylenol or ibuprofen. -Throat lozenges if help -Follow up if symptoms worsen or do not improve  - predniSONE (DELTASONE) 10 MG tablet; Days 1-4 take 4 tablets (40 mg) daily  Days 5-8 take 3 tablets  (30 mg) daily, Days 9-11 take 2 tablets (20 mg) daily, Days 12-14 take 1 tablet (10 mg) daily.  Dispense: 37 tablet; Refill: 0  2. Disseminated herpes zoster - hydrOXYzine (VISTARIL) 25 MG capsule; Take 1 capsule (25 mg total) by mouth every 6 (six) hours  as needed for itching.  Dispense: 30 capsule; Refill: 0   Prednisone Prescription sent to pharmacy  Vistaril as needed for itching  - Take meds as prescribed - Use a cool mist humidifier  -Use saline nose sprays frequently -Force fluids -For any cough or congestion  Use plain Mucinex- regular strength or max strength is fine -For fever or aces or pains- take tylenol or ibuprofen. -Throat lozenges if help -Follow up if symptoms worsen or do not improve    Jannifer Rodney, FNP

## 2024-02-18 NOTE — Patient Instructions (Signed)

## 2024-02-20 DIAGNOSIS — D849 Immunodeficiency, unspecified: Secondary | ICD-10-CM | POA: Diagnosis not present

## 2024-02-20 DIAGNOSIS — T8649 Other complications of liver transplant: Secondary | ICD-10-CM | POA: Diagnosis not present

## 2024-02-20 DIAGNOSIS — Z944 Liver transplant status: Secondary | ICD-10-CM | POA: Diagnosis not present

## 2024-02-20 DIAGNOSIS — K743 Primary biliary cirrhosis: Secondary | ICD-10-CM | POA: Diagnosis not present

## 2024-02-24 ENCOUNTER — Encounter (INDEPENDENT_AMBULATORY_CARE_PROVIDER_SITE_OTHER): Payer: Self-pay | Admitting: Gastroenterology

## 2024-02-24 ENCOUNTER — Ambulatory Visit (INDEPENDENT_AMBULATORY_CARE_PROVIDER_SITE_OTHER): Admitting: Gastroenterology

## 2024-02-24 VITALS — BP 153/78 | HR 76 | Temp 98.0°F | Ht 64.0 in | Wt 144.3 lb

## 2024-02-24 DIAGNOSIS — K31819 Angiodysplasia of stomach and duodenum without bleeding: Secondary | ICD-10-CM

## 2024-02-24 DIAGNOSIS — K743 Primary biliary cirrhosis: Secondary | ICD-10-CM | POA: Diagnosis not present

## 2024-02-24 DIAGNOSIS — Z944 Liver transplant status: Secondary | ICD-10-CM | POA: Diagnosis not present

## 2024-02-24 DIAGNOSIS — Z1509 Genetic susceptibility to other malignant neoplasm: Secondary | ICD-10-CM

## 2024-02-24 DIAGNOSIS — K9189 Other postprocedural complications and disorders of digestive system: Secondary | ICD-10-CM | POA: Diagnosis not present

## 2024-02-24 DIAGNOSIS — D509 Iron deficiency anemia, unspecified: Secondary | ICD-10-CM

## 2024-02-24 DIAGNOSIS — K831 Obstruction of bile duct: Secondary | ICD-10-CM

## 2024-02-24 NOTE — Patient Instructions (Addendum)
 Schedule EGD and colonoscopy Continue ursodiol 900 milligrams every day Follow-up with transplant hepatology and [facility] regarding liver disease and ERCP in May

## 2024-02-24 NOTE — Progress Notes (Signed)
 Cassidy Barber, M.D. Gastroenterology & Hepatology [facility] Gastroenterology [address] [place], [place] [address]  Primary Care Physician: Junie Spencer, FNP [address] [place] [address]  I will communicate my assessment and recommendations to the referring MD via EMR.  Problems: GAVE PBC cirrhosis status post liver transplant Lynch syndrome - MLH1 mutation   History of Present Illness: Cassidy Barber is a 73 y.o. female with past medical history of PBC status post liver transplant, GAVE complicated by iron deficiency anemia, Lynch syndrome s/p proctocolectomy, hypertension, hypothyroidism, history of colon cancer, multiple skin cancers, who presents for follow up of Lynch syndrome, GAVE and PBC.  The patient was last seen on 08/22/2023. At that time, the patient was continued on ursodiol 900 milligrams every day.  She was advised to follow closely with [facility] surgeon as there was concern regarding drainage from abdominal wall.  There was some discussion regarding possibly adding a Khalia for management of her PBC as she had some mild elevation of her alkaline phosphatase.  However, there is no enough data in posttransplant patients and this was not considered adequate.  Notably, elevation of alkaline phosphatase was attributed to anastomotic stricture.  Plan was to repeat EGD and colonoscopy in April 2025.  Notably, the patient had elevation of liver function tests.  She underwent ERCP on 10/08/2023 by Dr. Wyline Mood due to concern of anastomotic stricture.  A 10 mm stenosis was found in the anastomosis of the bile ducts with largest diameter of 10 mm, which was dilated with the balloon.  A 7 French by 11 cm stent was placed.  Subsequently, an endoscopic retrograde cholangiopancreatography was performed on 01/08/2024, found to have 13 mm stenosis at the anastomosis with largest diameter of 9 mm.  Anastomosis was dilated with a 4 mm balloon dilator  and at 10 French x 13 cm plastic stent was left in the bile duct.  Patient is scheduled to undergo an ERCP on 04/07/2024.  Most recent labs from 02/20/2024 showed a CBC with hemoglobin 12.7, WBC 7.5 and platelet count 336, CMP with alkaline phosphatase 169, total bilirubin 0.5, AST 25, ALT 17, albumin 2.9.  States she received IV iron a month ago.  Patient is currently being seen by [facility] Liver transplant team. She has been on sirolimus and URSO. May have a liver biopsy in the future.  The patient denies having any nausea, vomiting, fever, chills, hematochezia, melena, hematemesis, abdominal distention, abdominal pain, diarrhea, jaundice, pruritus or weight loss.  She reports that she was hospitalized for a week recently for facial shingles. Was treated in the hospital for a week. She has been on Valtrex. She is still having numbness in her scalp.  Last EGD: 02/26/2023  2 cm hiatal hernia. Had GAVE in the gastric antrum that was ablated.   Last Colonoscopy: 02/26/2023  patent end-to-side ileocolonic anastomosis at 18 cm. Internal hemorrhoids.   Past Medical History: Past Medical History:  Diagnosis Date   Abdominal wall hernia    Incarcerated status post surgical repair 2019 - [facility]   Anemia of chronic disease    Atypical nevus 01/21/2018   atypical neoplasm- Left scalp-ant (txpbx + MOHS), atypical neoplasm- Left scalp post- (txpbx + MOHS)   Basal cell carcinoma    Colon cancer (HCC)    Colon surgery 2005 and 2012   History of pulmonary hypertension    Pre liver transplant   Hypertension    Hypothyroidism    Lynch syndrome  Osteopenia    Primary biliary cirrhosis (HCC)    Status post liver transplantation - follows at [facility]   SCCA (squamous cell carcinoma) of skin 02/23/2020   Right Upper Chest(moderate) (MOH's)   SCCA (squamous cell carcinoma) of skin 04/07/2020   Left Top Leg (Keratoacanthoma) treatment after biopsy   SCCA (squamous cell carcinoma) of skin 04/07/2020   Left  Foot Dorsal (in situ) treatment after biopsy   SCCA (squamous cell carcinoma) of skin 03/27/2021   Right Upper Back (Keratoacanthoma) (excision) (clear)   SCCA (squamous cell carcinoma) of skin 06/13/2021   Right Shoulder - anterior (moderately differentiated) (tx p bx)   SCCA (squamous cell carcinoma) of skin 06/13/2021   Right Thigh - anterior (well differentiated) (tx p bx)   SCCA (squamous cell carcinoma) of skin 06/13/2021   Right Lower Leg - anterior (well differentiated) (tx p bx)   Squamous cell carcinoma of skin 04/22/2018   KA-Right mid chest (txpbx), KA-left elbow crease (txpbx), insitu-Right mid chest inf. (exc)   Squamous cell carcinoma of skin 05/20/2018   well diff-Left upper shin (txpbx), well diff-Right lower forearm (txpbx), well diff-Right upper shin (txpbx)   Squamous cell carcinoma of skin 06/11/2018   Scc + margin-Right mid chest inferior    Squamous cell carcinoma of skin 06/27/2018   well diff-Left mid thigh(txpbx), well diff-Left inner thigh (txpbx),insitu-Right cheek (txpbx),well diff-right inner heel (txpbx)   Squamous cell carcinoma of skin 09/16/2018   well diff-left shoulder (txpbx), well diff-Right chin (txpbx), well diff-right chest lateral (CX35FU)   Squamous cell carcinoma of skin 10/01/2018   Right outer lower shin (Txpbx)   Squamous cell carcinoma of skin 04/03/2019   well diff-Right center chest (MOHS), in situ-Right ear   Squamous cell carcinoma of skin 08/05/2019   in situ-Left calf (txpbx), in situ-left bicep (txpbx), well diff-Left chest,inf(txpbx), in situ-Right chest inf-(txpbx)   Squamous cell carcinoma of skin 11/19/2019   KA-left top leg (txpbx), modify-Riight forehead-(mohs), in situ-right hand (txpbx), in situ-Right forearm (txpbx), well diff-Right chest (txpbx), well diff-chin (txpbx)   Squamous cell carcinoma of skin 01/06/2020   KA- Left top leg   Squamous cell carcinoma of skin 05/12/2013   bowens-middle of chest (CX35FU)   Squamous  cell carcinoma of skin 05/18/2015   well diff-Left upper arm (CX35FU + Exc),KA-right chest(txpbx), in situ-Left shin (txpbx), well diff-Right cheek (CX35FU), KA-Left post scalp (CX35FU)   Squamous cell carcinoma of skin 08/09/2015   KA-Left post scalp ((MOHS), in situ- mid chest (Txpbx +exc), in situ-Left upper arm inferior (txpbx)   Squamous cell carcinoma of skin 10/13/2015   Left upper arm-clear   Squamous cell carcinoma of skin 03/09/2016   mod diff-mid chest (txpbx+ exc), mod diff-Right chest (txpbx+exc), well diff-right cheek-(txpbx),well diff-Left hand-(txpbx), in situ-Left upper arm (txpbx), well diff-Right cheek -(txpbx), well diff-Right crease arm (txpbx)    Squamous cell carcinoma of skin 05/24/2016   well diff-Right nasal crease-(MOHS)   Squamous cell carcinoma of skin 08/02/2016   KA-Left chest med (txpbx)   Squamous cell carcinoma of skin 08/30/2016   in situ-Left outer zygoma (txpbx)   Squamous cell carcinoma of skin 12/06/2016   well diff-Left chest sup, Left shoudler, insitu- right post scalp   Squamous cell carcinoma of skin 02/14/2017   well diff-Left forearm (EXC),in situ-RIght ant neck   Squamous cell carcinoma of skin 06/14/2017   in situ-Right forearm (txpbx), in situ-Right chest (txpbx), well diff-left chest (txpbx), well diff-anterior neck- (txpbx)   Squamous cell carcinoma  of skin 08/07/2017   well diff-Left upper shoulder (txpbx), sup and invasive-Left temple (txpbx), well diff-Right upper shin (txpbx), in situ-Right clavicle (txpbx)   Squamous cell carcinoma of skin 10/17/2017   well diff-ant. neck (MOHS), in situ-Right chest, inf (txpbx)   Squamous cell carcinoma of skin 01/21/2018   well diff- Right chest,ulnar (txpbx), well diff- right upper chest (txpbx), in situ-Right ant. crown (txpbx)   Squamous cell carcinoma of skin 08/02/2020   well diff-left lower leg-inferior (Txpbx)   Squamous cell carcinoma of skin 08/02/2020   well diff-right lower leg-mid  (txpbx)   Squamous cell carcinoma of skin 08/02/2020   well diff-left chest upper   Squamous cell carcinoma of skin 08/02/2020   well diff-mid parietal scalp (MOHS)   Squamous cell carcinoma of skin 08/02/2020   well diff-right foot inner(txpbx)   Squamous cell carcinoma of skin 08/02/2020   well diff- left lower leg medial (txpbx)   Squamous cell carcinoma of skin 08/02/2020   well diff-left lower leg anterior (txpbx)   Squamous cell carcinoma of skin 08/02/2020   well diff-left lower leg medial (txpbx)   Squamous cell carcinoma of skin 08/02/2020   well diff-right forearm-posterior (txpbx)    Past Surgical History: Past Surgical History:  Procedure Laterality Date   ABDOMINAL HERNIA REPAIR     Patient's states that she has had 8- 9 hernia surgeries   ABDOMINAL HYSTERECTOMY     BIOPSY  02/15/2021   Procedure: BIOPSY;  Surgeon: Malissa Hippo, MD;  [facility];  Service: Endoscopy;;   BIOPSY  12/20/2021   Procedure: BIOPSY;  Surgeon: Malissa Hippo, MD;  [facility];  Service: Endoscopy;;   CATARACT EXTRACTION W/PHACO Right 01/15/2020   Procedure: CATARACT EXTRACTION PHACO AND INTRAOCULAR LENS PLACEMENT (IOC);  Surgeon: Fabio Pierce, MD;  Location: [facility];  Service: Ophthalmology;  Laterality: Right;  CDE: 7.89   CATARACT EXTRACTION W/PHACO Left 01/29/2020   Procedure: CATARACT EXTRACTION PHACO AND INTRAOCULAR LENS PLACEMENT (IOC) (CDE: 6.33);  Surgeon: Fabio Pierce, MD;  Location: [facility];  Service: Ophthalmology;  Laterality: Left;   CHOLECYSTECTOMY  2007   COLON SURGERY  2008   Done at [facility]   COLONOSCOPY     Done at [facility]   ESOPHAGOGASTRODUODENOSCOPY N/A 08/21/2018   Procedure: ESOPHAGOGASTRODUODENOSCOPY (EGD);  Surgeon: Malissa Hippo, MD;  [facility];  Service: Endoscopy;  Laterality: N/A;   ESOPHAGOGASTRODUODENOSCOPY (EGD) WITH PROPOFOL N/A 12/16/2019   Procedure: ESOPHAGOGASTRODUODENOSCOPY (EGD) WITH PROPOFOL;  Surgeon: Malissa Hippo, MD;  [facility];  Service: Endoscopy;  Laterality: N/A;   ESOPHAGOGASTRODUODENOSCOPY (EGD) WITH PROPOFOL N/A 12/20/2021   Procedure: ESOPHAGOGASTRODUODENOSCOPY (EGD) WITH PROPOFOL;  Surgeon: Malissa Hippo, MD;  [facility];  Service: Endoscopy;  Laterality: N/A;  9:05   ESOPHAGOGASTRODUODENOSCOPY (EGD) WITH PROPOFOL N/A 02/14/2022   Procedure: ESOPHAGOGASTRODUODENOSCOPY (EGD) WITH PROPOFOL;  Surgeon: Malissa Hippo, MD;  [facility];  Service: Endoscopy;  Laterality: N/A;  730   ESOPHAGOGASTRODUODENOSCOPY (EGD) WITH PROPOFOL N/A 06/04/2022   Procedure: ESOPHAGOGASTRODUODENOSCOPY (EGD) WITH PROPOFOL;  Surgeon: Dolores Frame, MD;  [facility];  Service: Gastroenterology;  Laterality: N/A;  145   ESOPHAGOGASTRODUODENOSCOPY (EGD) WITH PROPOFOL N/A 02/26/2023   Procedure: ESOPHAGOGASTRODUODENOSCOPY (EGD) WITH PROPOFOL;  Surgeon: Dolores Frame, MD;  [facility];  Service: Gastroenterology;  Laterality: N/A;  9:15AM;ASA 3   EYE SURGERY     lasix   FLEXIBLE SIGMOIDOSCOPY N/A 10/20/2015  Procedure: FLEXIBLE SIGMOIDOSCOPY;  Surgeon: Ruby Corporal, MD;  [facility];  Service: Endoscopy;  Laterality: N/A;  41 - Dr Homero Luster has meeting until 1:00   FLEXIBLE SIGMOIDOSCOPY N/A 07/11/2016   Procedure: FLEXIBLE SIGMOIDOSCOPY;  Surgeon: Ruby Corporal, MD;  [facility];  Service: Endoscopy;  Laterality: N/A;  1200   FLEXIBLE SIGMOIDOSCOPY N/A 08/09/2017   Procedure: FLEXIBLE SIGMOIDOSCOPY;  Surgeon: Ruby Corporal, MD;  [facility];  Service: Endoscopy;  Laterality: N/A;  1:00   FLEXIBLE SIGMOIDOSCOPY N/A 08/21/2018   Procedure: FLEXIBLE SIGMOIDOSCOPY;  Surgeon: Ruby Corporal, MD;  [facility];  Service: Endoscopy;  Laterality: N/A;   FLEXIBLE SIGMOIDOSCOPY N/A 12/16/2019   Procedure: FLEXIBLE SIGMOIDOSCOPY wirh Propofol;  Surgeon: Ruby Corporal, MD;  [facility];  Service: Endoscopy;  Laterality: N/A;  7:30   FLEXIBLE SIGMOIDOSCOPY N/A 02/15/2021   Procedure: FLEXIBLE SIGMOIDOSCOPY WITH PROPOFOL;  Surgeon: Ruby Corporal, MD;  [facility];  Service: Endoscopy;  Laterality: N/A;  am   FLEXIBLE SIGMOIDOSCOPY N/A 12/20/2021   Procedure: FLEXIBLE SIGMOIDOSCOPY;  Surgeon: Ruby Corporal, MD;  [facility];  Service: Endoscopy;  Laterality: N/A;   FLEXIBLE SIGMOIDOSCOPY N/A 02/26/2023   Procedure: FLEXIBLE SIGMOIDOSCOPY;  Surgeon: Urban Garden, MD;  [facility];  Service: Gastroenterology;  Laterality: N/A;  9:15AM; ASA 3   FRACTURE SURGERY     right wrist metal plate   GI RADIOFREQUENCY ABLATION N/A 02/14/2022   Procedure: GI RADIOFREQUENCY ABLATION;  Surgeon: Ruby Corporal, MD;  [facility];  Service: Endoscopy;  Laterality: N/A;   HOT HEMOSTASIS N/A 12/20/2021   Procedure: HOT HEMOSTASIS (ARGON PLASMA COAGULATION/BICAP);  Surgeon: Ruby Corporal, MD;  [facility];  Service: Endoscopy;  Laterality: N/A;   HOT HEMOSTASIS  06/04/2022   Procedure: HOT HEMOSTASIS (ARGON PLASMA COAGULATION/BICAP);  Surgeon: Umberto Ganong, Bearl Limes, MD;  [facility];  Service: Gastroenterology;;   HOT HEMOSTASIS  02/26/2023   Procedure: HOT HEMOSTASIS (ARGON PLASMA COAGULATION/BICAP);  Surgeon: Umberto Ganong, Bearl Limes, MD;  [facility];  Service: Gastroenterology;;   IRRIGATION AND DEBRIDEMENT OF WOUND WITH SPLIT THICKNESS SKIN GRAFT N/A 05/13/2023   Procedure: Excision of scalp wound with Myriad or Acell placement;  Surgeon: Thornell Flirt, DO;  Location:  SURGERY CENTER;  Service: Plastics;  Laterality: N/A;   LIVER TRANSPLANT  11/19/2013   POLYPECTOMY  08/09/2017   Procedure: POLYPECTOMY;  Surgeon: Ruby Corporal, MD;  [facility];  Service: Endoscopy;;  colon small bowel   POLYPECTOMY N/A 02/14/2022   Procedure: POLYPECTOMY;  Surgeon:  Ruby Corporal, MD;  [facility];  Service: Endoscopy;  Laterality: N/A;   REVERSE SHOULDER ARTHROPLASTY Left 07/17/2018   Procedure: LEFT REVERSE SHOULDER ARTHROPLASTY;  Surgeon: Ellard Gunning, MD;  Location: [facility];  Service: Orthopedics;  Laterality: Left;    REVERSE SHOULDER ARTHROPLASTY Right 07/20/2021   Procedure: REVERSE SHOULDER ARTHROPLASTY;  Surgeon: Ellard Gunning, MD;  Location: [facility];  Service: Orthopedics;  Laterality: Right;   SHOULDER CLOSED REDUCTION Left 09/27/2019   Procedure: CLOSED REDUCTION SHOULDER;  Surgeon: Claiborne Crew, MD;  Location: [facility];  Service: Orthopedics;  Laterality: Left;   SPLENECTOMY  2006   TOTAL SHOULDER REVISION Left 11/12/2019   Procedure: Revision Left Reverse Shoulder Arthroplasty with poly exchange SDD;  Surgeon: Ellard Gunning, MD;  Location: [facility];  Service: Orthopedics;  Laterality:  Left;  -SDDC   TYMPANOSTOMY TUBE PLACEMENT     UPPER GASTROINTESTINAL ENDOSCOPY     Done at [facility]    Family History: Family History  Problem Relation Age of Onset   Prostate cancer Father    Colon cancer Father    Colon cancer Sister    Lung cancer Sister    Healthy Son    Alcohol abuse Brother    Allergic rhinitis Neg Hx    Asthma Neg Hx    Eczema Neg Hx    Urticaria Neg Hx     Social History: Social History   Tobacco Use  Smoking Status Never  Smokeless Tobacco Never   Social History   Substance and Sexual Activity  Alcohol Use No   Alcohol/week: 0.0 standard drinks of alcohol   Social History   Substance and Sexual Activity  Drug Use No    Allergies: Allergies  Allergen Reactions   Ciprofloxacin Itching   Codeine Nausea Only    Medications: Current Outpatient Medications  Medication Sig Dispense Refill   acetaminophen (TYLENOL) 500 MG tablet Take 1,000 mg by mouth every 6 (six) hours as needed (for pain.).      alendronate (FOSAMAX) 70 MG tablet TAKE 1 TABLET EVERY WEEK 12 tablet 2   ALPRAZolam  (XANAX) 0.5 MG tablet Take 1 tablet (0.5 mg total) by mouth at bedtime. 90 tablet 1   Biotin w/ Vitamins C & E (HAIR SKIN & NAILS GUMMIES PO) Take 2 tablets by mouth daily.     Calcium Carb-Cholecalciferol (CALCIUM 600 + D PO) Take 1 tablet by mouth 2 (two) times daily.     cetirizine (ZYRTEC) 10 MG tablet Take 1 tablet (10 mg total) by mouth daily as needed for allergies. 90 tablet 3   Cholecalciferol (VITAMIN D) 50 MCG (2000 UT) tablet Take 2,000 Units by mouth daily.     Clobetasol Prop Emollient Base (CLOBETASOL PROPIONATE E) 0.05 % emollient cream Apply 1 application. topically 2 (two) times daily. (Patient taking differently: Apply 1 application  topically 2 (two) times daily as needed (rash).) 180 g 1   clotrimazole-betamethasone (LOTRISONE) cream Apply 1 application topically 2 (two) times daily. (Patient taking differently: Apply 1 application  topically daily as needed (Rash).) 30 g 0   famotidine (PEPCID) 20 MG tablet Take 1 tablet (20 mg total) by mouth 2 (two) times daily. 60 tablet 5   fluorouracil (EFUDEX) 5 % cream Apply 1 application topically daily as needed (cancer spots).   0   fluticasone (FLONASE) 50 MCG/ACT nasal spray Place 2 sprays into both nostrils daily. 16 g 6   gabapentin (NEURONTIN) 100 MG capsule Take 1 capsule (100 mg total) by mouth 3 (three) times daily as needed. 90 capsule 3   hydrocortisone cream 1 % Apply 1 Application topically 2 (two) times daily as needed for itching.     hydrOXYzine (VISTARIL) 25 MG capsule Take 1 capsule (25 mg total) by mouth every 6 (six) hours as needed for itching. 30 capsule 0   ketoconazole (NIZORAL) 2 % cream Apply 1 Application topically 2 (two) times daily. 15 g 0   levothyroxine (SYNTHROID) 112 MCG tablet TAKE ONE (1) TABLET BY MOUTH EVERY DAY 90 tablet 2   losartan (COZAAR) 25 MG tablet Take 1 tablet (25 mg total) by mouth daily. 90 tablet 4   metoprolol succinate (TOPROL-XL) 25 MG 24 hr tablet Take 1 tablet (25 mg total) by  mouth daily. 90 tablet 1   Multiple Vitamins-Minerals (  MULTIVITAMIN WITH MINERALS) tablet Take 1 tablet by mouth daily.     mupirocin ointment (BACTROBAN) 2 % Apply 1 application topically daily as needed (Cancer spots). 22 g 3   ondansetron (ZOFRAN) 4 MG tablet Take 1 tablet (4 mg total) by mouth every 8 (eight) hours as needed for nausea or vomiting. 90 tablet 1   pantoprazole (PROTONIX) 40 MG tablet Take 1 tablet (40 mg total) by mouth daily. 90 tablet 1   predniSONE (DELTASONE) 10 MG tablet Days 1-4 take 4 tablets (40 mg) daily  Days 5-8 take 3 tablets (30 mg) daily, Days 9-11 take 2 tablets (20 mg) daily, Days 12-14 take 1 tablet (10 mg) daily. 37 tablet 0   Probiotic Product (PROBIOTIC PO) Take 1 capsule by mouth daily.     promethazine-dextromethorphan (PROMETHAZINE-DM) 6.25-15 MG/5ML syrup Take 5 mLs by mouth 3 (three) times daily as needed for cough. 240 mL 0   SUMAtriptan (IMITREX) 50 MG tablet Take 1 tablet (50 mg total) by mouth every 2 (two) hours as needed for migraine. May repeat in 2 hours if headache persists or recurs. 10 tablet 0   ursodiol (ACTIGALL) 300 MG capsule Take 3 capsules (900 mg total) by mouth daily. 90 capsule 5   vitamin C (ASCORBIC ACID) 250 MG tablet Take 250 mg by mouth daily.     Vitamin D, Ergocalciferol, 50000 units CAPS TAKE 1 CAPSULE BY MOUTH ONCE A WEEK 12 capsule 1   vitamin E 180 MG (400 UNITS) capsule Take 400 Units by mouth daily.     No current facility-administered medications for this visit.    Review of Systems: GENERAL: negative for malaise, night sweats HEENT: No changes in hearing or vision, no nose bleeds or other nasal problems. NECK: Negative for lumps, goiter, pain and significant neck swelling RESPIRATORY: Negative for cough, wheezing CARDIOVASCULAR: Negative for chest pain, leg swelling, palpitations, orthopnea GI: SEE HPI MUSCULOSKELETAL: Negative for joint pain or swelling, back pain, and muscle pain. SKIN: Negative for lesions,  rash PSYCH: Negative for sleep disturbance, mood disorder and recent psychosocial stressors. HEMATOLOGY Negative for prolonged bleeding, bruising easily, and swollen nodes. ENDOCRINE: Negative for cold or heat intolerance, polyuria, polydipsia and goiter. NEURO: negative for tremor, gait imbalance, syncope and seizures. The remainder of the review of systems is noncontributory.   Physical Exam: BP (!) 153/78 (BP Location: Left Arm, Patient Position: Sitting, Cuff Size: Normal)   Pulse 76   Temp 98 F (36.7 C) (Temporal)   Ht 5\' 4"  (1.626 m)   Wt 144 lb 4.8 oz (65.5 kg)   BMI 24.77 kg/m  GENERAL: The patient is AO x3, in no acute distress. HEENT: Head is normocephalic and atraumatic. EOMI are intact. Mouth is well hydrated and without lesions. NECK: Supple. No masses LUNGS: Clear to auscultation. No presence of rhonchi/wheezing/rales. Adequate chest expansion HEART: RRR, normal s1 and s2. ABDOMEN: Soft, nontender, no guarding, no peritoneal signs, and nondistended. BS +. No masses. EXTREMITIES: Without any cyanosis, clubbing, rash, lesions or edema. NEUROLOGIC: AOx3, no focal motor deficit. SKIN: no jaundice, no rashes  Imaging/Labs: as above  I personally reviewed and interpreted the available labs, imaging and endoscopic files.  Impression and Plan: Cassidy Barber is a 73 y.o. female with past medical history of PBC status post liver transplant, GAVE complicated by iron deficiency anemia, Lynch syndrome s/p proctocolectomy, hypertension, hypothyroidism, history of colon cancer, multiple skin cancers, who presents for follow up of Lynch syndrome, GAVE and PBC.  Regarding the  patient's Lynch syndrome, we have been surveying her upper and lower gastrointestinal tract with EGD and colonoscopy every year.  She has not presented any new symptoms.  We will schedule her for repeat procedures as she is due for this.  She is also following with dermatology regarding her skin cancers, which  are more common in patients with Lynch syndrome.  In terms of her PBC, this appears to be well-controlled with her current dose of URSO.  She had some elevation of her alkaline phosphatase in the past, but this was considered to be secondary to anastomotic stricture, which is being actively treated by advanced endoscopist at [facility] with dilation and stent placement of the biliary tract.  Finally, the patient has had stable hemoglobin and has not required any blood transfusions, but is still requiring intermittent IV iron infusions.  We will proceed with ablation of GAVE if present in the upcoming endoscopy.  -Schedule EGD and colonoscopy -Continue ursodiol 900 milligrams every day -Follow-up with transplant hepatology and [facility] advanced endoscopy -regarding liver disease and ERCP in May -Continue follow-up with dermatologist for skin cancer surveillance  All questions were answered.      Samantha Cress, MD Gastroenterology and Hepatology [facility] Gastroenterology

## 2024-02-24 NOTE — H&P (View-Only) (Signed)
 Cassidy Barber, M.D. Gastroenterology & Hepatology [facility] Gastroenterology [address] [place], [place] [address]  Primary Care Physician: Junie Spencer, FNP [address] [place] [address]  I will communicate my assessment and recommendations to the referring MD via EMR.  Problems: GAVE PBC cirrhosis status post liver transplant Lynch syndrome - MLH1 mutation   History of Present Illness: Cassidy Barber is a 73 y.o. female with past medical history of PBC status post liver transplant, GAVE complicated by iron deficiency anemia, Lynch syndrome s/p proctocolectomy, hypertension, hypothyroidism, history of colon cancer, multiple skin cancers, who presents for follow up of Lynch syndrome, GAVE and PBC.  The patient was last seen on 08/22/2023. At that time, the patient was continued on ursodiol 900 milligrams every day.  She was advised to follow closely with [facility] surgeon as there was concern regarding drainage from abdominal wall.  There was some discussion regarding possibly adding a Khalia for management of her PBC as she had some mild elevation of her alkaline phosphatase.  However, there is no enough data in posttransplant patients and this was not considered adequate.  Notably, elevation of alkaline phosphatase was attributed to anastomotic stricture.  Plan was to repeat EGD and colonoscopy in April 2025.  Notably, the patient had elevation of liver function tests.  She underwent ERCP on 10/08/2023 by Dr. Wyline Mood due to concern of anastomotic stricture.  A 10 mm stenosis was found in the anastomosis of the bile ducts with largest diameter of 10 mm, which was dilated with the balloon.  A 7 French by 11 cm stent was placed.  Subsequently, an endoscopic retrograde cholangiopancreatography was performed on 01/08/2024, found to have 13 mm stenosis at the anastomosis with largest diameter of 9 mm.  Anastomosis was dilated with a 4 mm balloon dilator  and at 10 French x 13 cm plastic stent was left in the bile duct.  Patient is scheduled to undergo an ERCP on 04/07/2024.  Most recent labs from 02/20/2024 showed a CBC with hemoglobin 12.7, WBC 7.5 and platelet count 336, CMP with alkaline phosphatase 169, total bilirubin 0.5, AST 25, ALT 17, albumin 2.9.  States she received IV iron a month ago.  Patient is currently being seen by [facility] Liver transplant team. She has been on sirolimus and URSO. May have a liver biopsy in the future.  The patient denies having any nausea, vomiting, fever, chills, hematochezia, melena, hematemesis, abdominal distention, abdominal pain, diarrhea, jaundice, pruritus or weight loss.  She reports that she was hospitalized for a week recently for facial shingles. Was treated in the hospital for a week. She has been on Valtrex. She is still having numbness in her scalp.  Last EGD: 02/26/2023  2 cm hiatal hernia. Had GAVE in the gastric antrum that was ablated.   Last Colonoscopy: 02/26/2023  patent end-to-side ileocolonic anastomosis at 18 cm. Internal hemorrhoids.   Past Medical History: Past Medical History:  Diagnosis Date   Abdominal wall hernia    Incarcerated status post surgical repair 2019 - [facility]   Anemia of chronic disease    Atypical nevus 01/21/2018   atypical neoplasm- Left scalp-ant (txpbx + MOHS), atypical neoplasm- Left scalp post- (txpbx + MOHS)   Basal cell carcinoma    Colon cancer (HCC)    Colon surgery 2005 and 2012   History of pulmonary hypertension    Pre liver transplant   Hypertension    Hypothyroidism    Lynch syndrome  Osteopenia    Primary biliary cirrhosis (HCC)    Status post liver transplantation - follows at [facility]   SCCA (squamous cell carcinoma) of skin 02/23/2020   Right Upper Chest(moderate) (MOH's)   SCCA (squamous cell carcinoma) of skin 04/07/2020   Left Top Leg (Keratoacanthoma) treatment after biopsy   SCCA (squamous cell carcinoma) of skin 04/07/2020   Left  Foot Dorsal (in situ) treatment after biopsy   SCCA (squamous cell carcinoma) of skin 03/27/2021   Right Upper Back (Keratoacanthoma) (excision) (clear)   SCCA (squamous cell carcinoma) of skin 06/13/2021   Right Shoulder - anterior (moderately differentiated) (tx p bx)   SCCA (squamous cell carcinoma) of skin 06/13/2021   Right Thigh - anterior (well differentiated) (tx p bx)   SCCA (squamous cell carcinoma) of skin 06/13/2021   Right Lower Leg - anterior (well differentiated) (tx p bx)   Squamous cell carcinoma of skin 04/22/2018   KA-Right mid chest (txpbx), KA-left elbow crease (txpbx), insitu-Right mid chest inf. (exc)   Squamous cell carcinoma of skin 05/20/2018   well diff-Left upper shin (txpbx), well diff-Right lower forearm (txpbx), well diff-Right upper shin (txpbx)   Squamous cell carcinoma of skin 06/11/2018   Scc + margin-Right mid chest inferior    Squamous cell carcinoma of skin 06/27/2018   well diff-Left mid thigh(txpbx), well diff-Left inner thigh (txpbx),insitu-Right cheek (txpbx),well diff-right inner heel (txpbx)   Squamous cell carcinoma of skin 09/16/2018   well diff-left shoulder (txpbx), well diff-Right chin (txpbx), well diff-right chest lateral (CX35FU)   Squamous cell carcinoma of skin 10/01/2018   Right outer lower shin (Txpbx)   Squamous cell carcinoma of skin 04/03/2019   well diff-Right center chest (MOHS), in situ-Right ear   Squamous cell carcinoma of skin 08/05/2019   in situ-Left calf (txpbx), in situ-left bicep (txpbx), well diff-Left chest,inf(txpbx), in situ-Right chest inf-(txpbx)   Squamous cell carcinoma of skin 11/19/2019   KA-left top leg (txpbx), modify-Riight forehead-(mohs), in situ-right hand (txpbx), in situ-Right forearm (txpbx), well diff-Right chest (txpbx), well diff-chin (txpbx)   Squamous cell carcinoma of skin 01/06/2020   KA- Left top leg   Squamous cell carcinoma of skin 05/12/2013   bowens-middle of chest (CX35FU)   Squamous  cell carcinoma of skin 05/18/2015   well diff-Left upper arm (CX35FU + Exc),KA-right chest(txpbx), in situ-Left shin (txpbx), well diff-Right cheek (CX35FU), KA-Left post scalp (CX35FU)   Squamous cell carcinoma of skin 08/09/2015   KA-Left post scalp ((MOHS), in situ- mid chest (Txpbx +exc), in situ-Left upper arm inferior (txpbx)   Squamous cell carcinoma of skin 10/13/2015   Left upper arm-clear   Squamous cell carcinoma of skin 03/09/2016   mod diff-mid chest (txpbx+ exc), mod diff-Right chest (txpbx+exc), well diff-right cheek-(txpbx),well diff-Left hand-(txpbx), in situ-Left upper arm (txpbx), well diff-Right cheek -(txpbx), well diff-Right crease arm (txpbx)    Squamous cell carcinoma of skin 05/24/2016   well diff-Right nasal crease-(MOHS)   Squamous cell carcinoma of skin 08/02/2016   KA-Left chest med (txpbx)   Squamous cell carcinoma of skin 08/30/2016   in situ-Left outer zygoma (txpbx)   Squamous cell carcinoma of skin 12/06/2016   well diff-Left chest sup, Left shoudler, insitu- right post scalp   Squamous cell carcinoma of skin 02/14/2017   well diff-Left forearm (EXC),in situ-RIght ant neck   Squamous cell carcinoma of skin 06/14/2017   in situ-Right forearm (txpbx), in situ-Right chest (txpbx), well diff-left chest (txpbx), well diff-anterior neck- (txpbx)   Squamous cell carcinoma  of skin 08/07/2017   well diff-Left upper shoulder (txpbx), sup and invasive-Left temple (txpbx), well diff-Right upper shin (txpbx), in situ-Right clavicle (txpbx)   Squamous cell carcinoma of skin 10/17/2017   well diff-ant. neck (MOHS), in situ-Right chest, inf (txpbx)   Squamous cell carcinoma of skin 01/21/2018   well diff- Right chest,ulnar (txpbx), well diff- right upper chest (txpbx), in situ-Right ant. crown (txpbx)   Squamous cell carcinoma of skin 08/02/2020   well diff-left lower leg-inferior (Txpbx)   Squamous cell carcinoma of skin 08/02/2020   well diff-right lower leg-mid  (txpbx)   Squamous cell carcinoma of skin 08/02/2020   well diff-left chest upper   Squamous cell carcinoma of skin 08/02/2020   well diff-mid parietal scalp (MOHS)   Squamous cell carcinoma of skin 08/02/2020   well diff-right foot inner(txpbx)   Squamous cell carcinoma of skin 08/02/2020   well diff- left lower leg medial (txpbx)   Squamous cell carcinoma of skin 08/02/2020   well diff-left lower leg anterior (txpbx)   Squamous cell carcinoma of skin 08/02/2020   well diff-left lower leg medial (txpbx)   Squamous cell carcinoma of skin 08/02/2020   well diff-right forearm-posterior (txpbx)    Past Surgical History: Past Surgical History:  Procedure Laterality Date   ABDOMINAL HERNIA REPAIR     Patient's states that she has had 8- 9 hernia surgeries   ABDOMINAL HYSTERECTOMY     BIOPSY  02/15/2021   Procedure: BIOPSY;  Surgeon: Malissa Hippo, MD;  [facility];  Service: Endoscopy;;   BIOPSY  12/20/2021   Procedure: BIOPSY;  Surgeon: Malissa Hippo, MD;  [facility];  Service: Endoscopy;;   CATARACT EXTRACTION W/PHACO Right 01/15/2020   Procedure: CATARACT EXTRACTION PHACO AND INTRAOCULAR LENS PLACEMENT (IOC);  Surgeon: Fabio Pierce, MD;  Location: [facility];  Service: Ophthalmology;  Laterality: Right;  CDE: 7.89   CATARACT EXTRACTION W/PHACO Left 01/29/2020   Procedure: CATARACT EXTRACTION PHACO AND INTRAOCULAR LENS PLACEMENT (IOC) (CDE: 6.33);  Surgeon: Fabio Pierce, MD;  Location: [facility];  Service: Ophthalmology;  Laterality: Left;   CHOLECYSTECTOMY  2007   COLON SURGERY  2008   Done at [facility]   COLONOSCOPY     Done at [facility]   ESOPHAGOGASTRODUODENOSCOPY N/A 08/21/2018   Procedure: ESOPHAGOGASTRODUODENOSCOPY (EGD);  Surgeon: Malissa Hippo, MD;  [facility];  Service: Endoscopy;  Laterality: N/A;   ESOPHAGOGASTRODUODENOSCOPY (EGD) WITH PROPOFOL N/A 12/16/2019   Procedure: ESOPHAGOGASTRODUODENOSCOPY (EGD) WITH PROPOFOL;  Surgeon: Malissa Hippo, MD;  [facility];  Service: Endoscopy;  Laterality: N/A;   ESOPHAGOGASTRODUODENOSCOPY (EGD) WITH PROPOFOL N/A 12/20/2021   Procedure: ESOPHAGOGASTRODUODENOSCOPY (EGD) WITH PROPOFOL;  Surgeon: Malissa Hippo, MD;  [facility];  Service: Endoscopy;  Laterality: N/A;  9:05   ESOPHAGOGASTRODUODENOSCOPY (EGD) WITH PROPOFOL N/A 02/14/2022   Procedure: ESOPHAGOGASTRODUODENOSCOPY (EGD) WITH PROPOFOL;  Surgeon: Malissa Hippo, MD;  [facility];  Service: Endoscopy;  Laterality: N/A;  730   ESOPHAGOGASTRODUODENOSCOPY (EGD) WITH PROPOFOL N/A 06/04/2022   Procedure: ESOPHAGOGASTRODUODENOSCOPY (EGD) WITH PROPOFOL;  Surgeon: Dolores Frame, MD;  [facility];  Service: Gastroenterology;  Laterality: N/A;  145   ESOPHAGOGASTRODUODENOSCOPY (EGD) WITH PROPOFOL N/A 02/26/2023   Procedure: ESOPHAGOGASTRODUODENOSCOPY (EGD) WITH PROPOFOL;  Surgeon: Dolores Frame, MD;  [facility];  Service: Gastroenterology;  Laterality: N/A;  9:15AM;ASA 3   EYE SURGERY     lasix   FLEXIBLE SIGMOIDOSCOPY N/A 10/20/2015  Procedure: FLEXIBLE SIGMOIDOSCOPY;  Surgeon: Ruby Corporal, MD;  [facility];  Service: Endoscopy;  Laterality: N/A;  41 - Dr Homero Luster has meeting until 1:00   FLEXIBLE SIGMOIDOSCOPY N/A 07/11/2016   Procedure: FLEXIBLE SIGMOIDOSCOPY;  Surgeon: Ruby Corporal, MD;  [facility];  Service: Endoscopy;  Laterality: N/A;  1200   FLEXIBLE SIGMOIDOSCOPY N/A 08/09/2017   Procedure: FLEXIBLE SIGMOIDOSCOPY;  Surgeon: Ruby Corporal, MD;  [facility];  Service: Endoscopy;  Laterality: N/A;  1:00   FLEXIBLE SIGMOIDOSCOPY N/A 08/21/2018   Procedure: FLEXIBLE SIGMOIDOSCOPY;  Surgeon: Ruby Corporal, MD;  [facility];  Service: Endoscopy;  Laterality: N/A;   FLEXIBLE SIGMOIDOSCOPY N/A 12/16/2019   Procedure: FLEXIBLE SIGMOIDOSCOPY wirh Propofol;  Surgeon: Ruby Corporal, MD;  [facility];  Service: Endoscopy;  Laterality: N/A;  7:30   FLEXIBLE SIGMOIDOSCOPY N/A 02/15/2021   Procedure: FLEXIBLE SIGMOIDOSCOPY WITH PROPOFOL;  Surgeon: Ruby Corporal, MD;  [facility];  Service: Endoscopy;  Laterality: N/A;  am   FLEXIBLE SIGMOIDOSCOPY N/A 12/20/2021   Procedure: FLEXIBLE SIGMOIDOSCOPY;  Surgeon: Ruby Corporal, MD;  [facility];  Service: Endoscopy;  Laterality: N/A;   FLEXIBLE SIGMOIDOSCOPY N/A 02/26/2023   Procedure: FLEXIBLE SIGMOIDOSCOPY;  Surgeon: Urban Garden, MD;  [facility];  Service: Gastroenterology;  Laterality: N/A;  9:15AM; ASA 3   FRACTURE SURGERY     right wrist metal plate   GI RADIOFREQUENCY ABLATION N/A 02/14/2022   Procedure: GI RADIOFREQUENCY ABLATION;  Surgeon: Ruby Corporal, MD;  [facility];  Service: Endoscopy;  Laterality: N/A;   HOT HEMOSTASIS N/A 12/20/2021   Procedure: HOT HEMOSTASIS (ARGON PLASMA COAGULATION/BICAP);  Surgeon: Ruby Corporal, MD;  [facility];  Service: Endoscopy;  Laterality: N/A;   HOT HEMOSTASIS  06/04/2022   Procedure: HOT HEMOSTASIS (ARGON PLASMA COAGULATION/BICAP);  Surgeon: Umberto Ganong, Bearl Limes, MD;  [facility];  Service: Gastroenterology;;   HOT HEMOSTASIS  02/26/2023   Procedure: HOT HEMOSTASIS (ARGON PLASMA COAGULATION/BICAP);  Surgeon: Umberto Ganong, Bearl Limes, MD;  [facility];  Service: Gastroenterology;;   IRRIGATION AND DEBRIDEMENT OF WOUND WITH SPLIT THICKNESS SKIN GRAFT N/A 05/13/2023   Procedure: Excision of scalp wound with Myriad or Acell placement;  Surgeon: Thornell Flirt, DO;  Location:  SURGERY CENTER;  Service: Plastics;  Laterality: N/A;   LIVER TRANSPLANT  11/19/2013   POLYPECTOMY  08/09/2017   Procedure: POLYPECTOMY;  Surgeon: Ruby Corporal, MD;  [facility];  Service: Endoscopy;;  colon small bowel   POLYPECTOMY N/A 02/14/2022   Procedure: POLYPECTOMY;  Surgeon:  Ruby Corporal, MD;  [facility];  Service: Endoscopy;  Laterality: N/A;   REVERSE SHOULDER ARTHROPLASTY Left 07/17/2018   Procedure: LEFT REVERSE SHOULDER ARTHROPLASTY;  Surgeon: Ellard Gunning, MD;  Location: [facility];  Service: Orthopedics;  Laterality: Left;    REVERSE SHOULDER ARTHROPLASTY Right 07/20/2021   Procedure: REVERSE SHOULDER ARTHROPLASTY;  Surgeon: Ellard Gunning, MD;  Location: [facility];  Service: Orthopedics;  Laterality: Right;   SHOULDER CLOSED REDUCTION Left 09/27/2019   Procedure: CLOSED REDUCTION SHOULDER;  Surgeon: Claiborne Crew, MD;  Location: [facility];  Service: Orthopedics;  Laterality: Left;   SPLENECTOMY  2006   TOTAL SHOULDER REVISION Left 11/12/2019   Procedure: Revision Left Reverse Shoulder Arthroplasty with poly exchange SDD;  Surgeon: Ellard Gunning, MD;  Location: [facility];  Service: Orthopedics;  Laterality:  Left;  -SDDC   TYMPANOSTOMY TUBE PLACEMENT     UPPER GASTROINTESTINAL ENDOSCOPY     Done at [facility]    Family History: Family History  Problem Relation Age of Onset   Prostate cancer Father    Colon cancer Father    Colon cancer Sister    Lung cancer Sister    Healthy Son    Alcohol abuse Brother    Allergic rhinitis Neg Hx    Asthma Neg Hx    Eczema Neg Hx    Urticaria Neg Hx     Social History: Social History   Tobacco Use  Smoking Status Never  Smokeless Tobacco Never   Social History   Substance and Sexual Activity  Alcohol Use No   Alcohol/week: 0.0 standard drinks of alcohol   Social History   Substance and Sexual Activity  Drug Use No    Allergies: Allergies  Allergen Reactions   Ciprofloxacin Itching   Codeine Nausea Only    Medications: Current Outpatient Medications  Medication Sig Dispense Refill   acetaminophen (TYLENOL) 500 MG tablet Take 1,000 mg by mouth every 6 (six) hours as needed (for pain.).      alendronate (FOSAMAX) 70 MG tablet TAKE 1 TABLET EVERY WEEK 12 tablet 2   ALPRAZolam  (XANAX) 0.5 MG tablet Take 1 tablet (0.5 mg total) by mouth at bedtime. 90 tablet 1   Biotin w/ Vitamins C & E (HAIR SKIN & NAILS GUMMIES PO) Take 2 tablets by mouth daily.     Calcium Carb-Cholecalciferol (CALCIUM 600 + D PO) Take 1 tablet by mouth 2 (two) times daily.     cetirizine (ZYRTEC) 10 MG tablet Take 1 tablet (10 mg total) by mouth daily as needed for allergies. 90 tablet 3   Cholecalciferol (VITAMIN D) 50 MCG (2000 UT) tablet Take 2,000 Units by mouth daily.     Clobetasol Prop Emollient Base (CLOBETASOL PROPIONATE E) 0.05 % emollient cream Apply 1 application. topically 2 (two) times daily. (Patient taking differently: Apply 1 application  topically 2 (two) times daily as needed (rash).) 180 g 1   clotrimazole-betamethasone (LOTRISONE) cream Apply 1 application topically 2 (two) times daily. (Patient taking differently: Apply 1 application  topically daily as needed (Rash).) 30 g 0   famotidine (PEPCID) 20 MG tablet Take 1 tablet (20 mg total) by mouth 2 (two) times daily. 60 tablet 5   fluorouracil (EFUDEX) 5 % cream Apply 1 application topically daily as needed (cancer spots).   0   fluticasone (FLONASE) 50 MCG/ACT nasal spray Place 2 sprays into both nostrils daily. 16 g 6   gabapentin (NEURONTIN) 100 MG capsule Take 1 capsule (100 mg total) by mouth 3 (three) times daily as needed. 90 capsule 3   hydrocortisone cream 1 % Apply 1 Application topically 2 (two) times daily as needed for itching.     hydrOXYzine (VISTARIL) 25 MG capsule Take 1 capsule (25 mg total) by mouth every 6 (six) hours as needed for itching. 30 capsule 0   ketoconazole (NIZORAL) 2 % cream Apply 1 Application topically 2 (two) times daily. 15 g 0   levothyroxine (SYNTHROID) 112 MCG tablet TAKE ONE (1) TABLET BY MOUTH EVERY DAY 90 tablet 2   losartan (COZAAR) 25 MG tablet Take 1 tablet (25 mg total) by mouth daily. 90 tablet 4   metoprolol succinate (TOPROL-XL) 25 MG 24 hr tablet Take 1 tablet (25 mg total) by  mouth daily. 90 tablet 1   Multiple Vitamins-Minerals (  MULTIVITAMIN WITH MINERALS) tablet Take 1 tablet by mouth daily.     mupirocin ointment (BACTROBAN) 2 % Apply 1 application topically daily as needed (Cancer spots). 22 g 3   ondansetron (ZOFRAN) 4 MG tablet Take 1 tablet (4 mg total) by mouth every 8 (eight) hours as needed for nausea or vomiting. 90 tablet 1   pantoprazole (PROTONIX) 40 MG tablet Take 1 tablet (40 mg total) by mouth daily. 90 tablet 1   predniSONE (DELTASONE) 10 MG tablet Days 1-4 take 4 tablets (40 mg) daily  Days 5-8 take 3 tablets (30 mg) daily, Days 9-11 take 2 tablets (20 mg) daily, Days 12-14 take 1 tablet (10 mg) daily. 37 tablet 0   Probiotic Product (PROBIOTIC PO) Take 1 capsule by mouth daily.     promethazine-dextromethorphan (PROMETHAZINE-DM) 6.25-15 MG/5ML syrup Take 5 mLs by mouth 3 (three) times daily as needed for cough. 240 mL 0   SUMAtriptan (IMITREX) 50 MG tablet Take 1 tablet (50 mg total) by mouth every 2 (two) hours as needed for migraine. May repeat in 2 hours if headache persists or recurs. 10 tablet 0   ursodiol (ACTIGALL) 300 MG capsule Take 3 capsules (900 mg total) by mouth daily. 90 capsule 5   vitamin C (ASCORBIC ACID) 250 MG tablet Take 250 mg by mouth daily.     Vitamin D, Ergocalciferol, 50000 units CAPS TAKE 1 CAPSULE BY MOUTH ONCE A WEEK 12 capsule 1   vitamin E 180 MG (400 UNITS) capsule Take 400 Units by mouth daily.     No current facility-administered medications for this visit.    Review of Systems: GENERAL: negative for malaise, night sweats HEENT: No changes in hearing or vision, no nose bleeds or other nasal problems. NECK: Negative for lumps, goiter, pain and significant neck swelling RESPIRATORY: Negative for cough, wheezing CARDIOVASCULAR: Negative for chest pain, leg swelling, palpitations, orthopnea GI: SEE HPI MUSCULOSKELETAL: Negative for joint pain or swelling, back pain, and muscle pain. SKIN: Negative for lesions,  rash PSYCH: Negative for sleep disturbance, mood disorder and recent psychosocial stressors. HEMATOLOGY Negative for prolonged bleeding, bruising easily, and swollen nodes. ENDOCRINE: Negative for cold or heat intolerance, polyuria, polydipsia and goiter. NEURO: negative for tremor, gait imbalance, syncope and seizures. The remainder of the review of systems is noncontributory.   Physical Exam: BP (!) 153/78 (BP Location: Left Arm, Patient Position: Sitting, Cuff Size: Normal)   Pulse 76   Temp 98 F (36.7 C) (Temporal)   Ht 5\' 4"  (1.626 m)   Wt 144 lb 4.8 oz (65.5 kg)   BMI 24.77 kg/m  GENERAL: The patient is AO x3, in no acute distress. HEENT: Head is normocephalic and atraumatic. EOMI are intact. Mouth is well hydrated and without lesions. NECK: Supple. No masses LUNGS: Clear to auscultation. No presence of rhonchi/wheezing/rales. Adequate chest expansion HEART: RRR, normal s1 and s2. ABDOMEN: Soft, nontender, no guarding, no peritoneal signs, and nondistended. BS +. No masses. EXTREMITIES: Without any cyanosis, clubbing, rash, lesions or edema. NEUROLOGIC: AOx3, no focal motor deficit. SKIN: no jaundice, no rashes  Imaging/Labs: as above  I personally reviewed and interpreted the available labs, imaging and endoscopic files.  Impression and Plan: YAHAIRA BRUSKI is a 73 y.o. female with past medical history of PBC status post liver transplant, GAVE complicated by iron deficiency anemia, Lynch syndrome s/p proctocolectomy, hypertension, hypothyroidism, history of colon cancer, multiple skin cancers, who presents for follow up of Lynch syndrome, GAVE and PBC.  Regarding the  patient's Lynch syndrome, we have been surveying her upper and lower gastrointestinal tract with EGD and colonoscopy every year.  She has not presented any new symptoms.  We will schedule her for repeat procedures as she is due for this.  She is also following with dermatology regarding her skin cancers, which  are more common in patients with Lynch syndrome.  In terms of her PBC, this appears to be well-controlled with her current dose of URSO.  She had some elevation of her alkaline phosphatase in the past, but this was considered to be secondary to anastomotic stricture, which is being actively treated by advanced endoscopist at [facility] with dilation and stent placement of the biliary tract.  Finally, the patient has had stable hemoglobin and has not required any blood transfusions, but is still requiring intermittent IV iron infusions.  We will proceed with ablation of GAVE if present in the upcoming endoscopy.  -Schedule EGD and colonoscopy -Continue ursodiol 900 milligrams every day -Follow-up with transplant hepatology and [facility] advanced endoscopy -regarding liver disease and ERCP in May -Continue follow-up with dermatologist for skin cancer surveillance  All questions were answered.      Samantha Cress, MD Gastroenterology and Hepatology [facility] Gastroenterology

## 2024-02-26 ENCOUNTER — Encounter (INDEPENDENT_AMBULATORY_CARE_PROVIDER_SITE_OTHER): Payer: Self-pay

## 2024-02-26 ENCOUNTER — Telehealth (INDEPENDENT_AMBULATORY_CARE_PROVIDER_SITE_OTHER): Payer: Self-pay | Admitting: Gastroenterology

## 2024-02-26 NOTE — Telephone Encounter (Signed)
 No pre cert needed for EGD/TCS per [employer]

## 2024-03-09 ENCOUNTER — Ambulatory Visit: Payer: Self-pay

## 2024-03-09 NOTE — Telephone Encounter (Signed)
 Copied from CRM [id number]. Topic: Clinical - Red Word Triage >> Mar 09, 2024 10:45 AM Leory Rands wrote: Red Word that prompted transfer to Nurse Triage: Patient is calling to report that she had Shingles January 13, 2024 right side numbness and unable to hear in ear due to stopped up. Seen 02/18/24, 02/14/24,02/06/24 Please advise    Chief Complaint: Recently has had shingles. Cannot hear from right ear. Symptoms: Above Frequency: 1-2 weeks Pertinent Negatives: Patient denies  Disposition: [] ED /[] Urgent Care (no appt availability in office) / [x] Appointment(In office/virtual)/ []  [facility]/ [] Home Care/ [] Refused Recommended Disposition /[] [facility]/ []  Follow-up with PCP Additional Notes: Agrees with appointment.  Reason for Disposition  Pain persisting > 1 month after rash disappears  Answer Assessment - Initial Assessment Questions 1. APPEARANCE of RASH: "Describe the rash."      Rash has cleared 2. LOCATION: "Where is the rash located?"      Right ear 3. ONSET: "When did the rash start?"      This month 4. ITCHING: "Does the rash itch?" If Yes, ask: "How bad is the itch?"  (Scale 1-10; or mild, moderate, severe)     no 5. PAIN: "Does the rash hurt?" If Yes, ask: "How bad is the pain?"  (Scale 0-10; or none, mild, moderate, severe)    - NONE (0): no pain    - MILD (1-3): doesn't interfere with normal activities     - MODERATE (4-7): interferes with normal activities or awakens from sleep     - SEVERE (8-10): excruciating pain, unable to do any normal activities     None 6. OTHER SYMPTOMS: "Do you have any other symptoms?" (e.g., fever)     Can't hear out of ear 7. PREGNANCY: "Is there any chance you are pregnant?" "When was your last menstrual period?"     no  Protocols used: Shingles (Zoster)-A-AH

## 2024-03-09 NOTE — Telephone Encounter (Signed)
 Fyi- has apt scheduled tomorrow

## 2024-03-10 ENCOUNTER — Encounter: Payer: Self-pay | Admitting: Family Medicine

## 2024-03-10 ENCOUNTER — Ambulatory Visit (INDEPENDENT_AMBULATORY_CARE_PROVIDER_SITE_OTHER)

## 2024-03-10 VITALS — BP 134/86 | HR 88 | Temp 98.3°F | Ht 64.0 in | Wt 143.0 lb

## 2024-03-10 DIAGNOSIS — H9193 Unspecified hearing loss, bilateral: Secondary | ICD-10-CM | POA: Diagnosis not present

## 2024-03-10 DIAGNOSIS — D849 Immunodeficiency, unspecified: Secondary | ICD-10-CM | POA: Diagnosis not present

## 2024-03-10 DIAGNOSIS — Z1231 Encounter for screening mammogram for malignant neoplasm of breast: Secondary | ICD-10-CM | POA: Diagnosis not present

## 2024-03-10 DIAGNOSIS — H7392 Unspecified disorder of tympanic membrane, left ear: Secondary | ICD-10-CM | POA: Diagnosis not present

## 2024-03-10 DIAGNOSIS — B0222 Postherpetic trigeminal neuralgia: Secondary | ICD-10-CM

## 2024-03-10 LAB — HM MAMMOGRAPHY

## 2024-03-10 NOTE — Progress Notes (Signed)
 Subjective: Cassidy Barber hearing right ear PCP: Cassidy Hem, FNP GEX:BMWU Cassidy Barber is a 73 y.o. female presenting to clinic today for:  1.  Decreased hearing Patient reports that she has had decreased hearing ever since she had shingles.  She was recently treated in the hospital at [facility] for this issue and has been continued on valacyclovir  for ongoing ophthalmologic issues.  She has not seen any ENT or neurology after this.  She worries because she wants to get her license this summer and is not sure if this is going to be a barrier.  Her hearing makes her feel like she is in a tube all the time   ROS: Per HPI  Allergies  Allergen Reactions   Ciprofloxacin  Itching   Codeine Nausea Only   Past Medical History:  Diagnosis Date   Abdominal wall hernia    Incarcerated status post surgical repair 2019 - [facility]   Anemia of chronic disease    Atypical nevus 01/21/2018   atypical neoplasm- Left scalp-ant (txpbx + MOHS), atypical neoplasm- Left scalp post- (txpbx + MOHS)   Basal cell carcinoma    Colon cancer (HCC)    Colon surgery 2005 and 2012   History of pulmonary hypertension    Pre liver transplant   Hypertension    Hypothyroidism    Lynch syndrome    Osteopenia    Primary biliary cirrhosis (HCC)    Status post liver transplantation - follows at [facility]   SCCA (squamous cell carcinoma) of skin 02/23/2020   Right Upper Chest(moderate) (MOH's)   SCCA (squamous cell carcinoma) of skin 04/07/2020   Left Top Leg (Keratoacanthoma) treatment after biopsy   SCCA (squamous cell carcinoma) of skin 04/07/2020   Left Foot Dorsal (in situ) treatment after biopsy   SCCA (squamous cell carcinoma) of skin 03/27/2021   Right Upper Back (Keratoacanthoma) (excision) (clear)   SCCA (squamous cell carcinoma) of skin 06/13/2021   Right Shoulder - anterior (moderately differentiated) (tx p bx)   SCCA (squamous cell carcinoma) of skin 06/13/2021   Right Thigh - anterior (well differentiated)  (tx p bx)   SCCA (squamous cell carcinoma) of skin 06/13/2021   Right Lower Leg - anterior (well differentiated) (tx p bx)   Squamous cell carcinoma of skin 04/22/2018   KA-Right mid chest (txpbx), KA-left elbow crease (txpbx), insitu-Right mid chest inf. (exc)   Squamous cell carcinoma of skin 05/20/2018   well diff-Left upper shin (txpbx), well diff-Right lower forearm (txpbx), well diff-Right upper shin (txpbx)   Squamous cell carcinoma of skin 06/11/2018   Scc + margin-Right mid chest inferior    Squamous cell carcinoma of skin 06/27/2018   well diff-Left mid thigh(txpbx), well diff-Left inner thigh (txpbx),insitu-Right cheek (txpbx),well diff-right inner heel (txpbx)   Squamous cell carcinoma of skin 09/16/2018   well diff-left shoulder (txpbx), well diff-Right chin (txpbx), well diff-right chest lateral (CX35FU)   Squamous cell carcinoma of skin 10/01/2018   Right outer lower shin (Txpbx)   Squamous cell carcinoma of skin 04/03/2019   well diff-Right center chest (MOHS), in situ-Right ear   Squamous cell carcinoma of skin 08/05/2019   in situ-Left calf (txpbx), in situ-left bicep (txpbx), well diff-Left chest,inf(txpbx), in situ-Right chest inf-(txpbx)   Squamous cell carcinoma of skin 11/19/2019   KA-left top leg (txpbx), modify-Riight forehead-(mohs), in situ-right hand (txpbx), in situ-Right forearm (txpbx), well diff-Right chest (txpbx), well diff-chin (txpbx)   Squamous cell carcinoma of skin 01/06/2020   KA- Left top leg  Squamous cell carcinoma of skin 05/12/2013   bowens-middle of chest (CX35FU)   Squamous cell carcinoma of skin 05/18/2015   well diff-Left upper arm (CX35FU + Exc),KA-right chest(txpbx), in situ-Left shin (txpbx), well diff-Right cheek (CX35FU), KA-Left post scalp (CX35FU)   Squamous cell carcinoma of skin 08/09/2015   KA-Left post scalp ((MOHS), in situ- mid chest (Txpbx +exc), in situ-Left upper arm inferior (txpbx)   Squamous cell carcinoma of skin  10/13/2015   Left upper arm-clear   Squamous cell carcinoma of skin 03/09/2016   mod diff-mid chest (txpbx+ exc), mod diff-Right chest (txpbx+exc), well diff-right cheek-(txpbx),well diff-Left hand-(txpbx), in situ-Left upper arm (txpbx), well diff-Right cheek -(txpbx), well diff-Right crease arm (txpbx)    Squamous cell carcinoma of skin 05/24/2016   well diff-Right nasal crease-(MOHS)   Squamous cell carcinoma of skin 08/02/2016   KA-Left chest med (txpbx)   Squamous cell carcinoma of skin 08/30/2016   in situ-Left outer zygoma (txpbx)   Squamous cell carcinoma of skin 12/06/2016   well diff-Left chest sup, Left shoudler, insitu- right post scalp   Squamous cell carcinoma of skin 02/14/2017   well diff-Left forearm (EXC),in situ-RIght ant neck   Squamous cell carcinoma of skin 06/14/2017   in situ-Right forearm (txpbx), in situ-Right chest (txpbx), well diff-left chest (txpbx), well diff-anterior neck- (txpbx)   Squamous cell carcinoma of skin 08/07/2017   well diff-Left upper shoulder (txpbx), sup and invasive-Left temple (txpbx), well diff-Right upper shin (txpbx), in situ-Right clavicle (txpbx)   Squamous cell carcinoma of skin 10/17/2017   well diff-ant. neck (MOHS), in situ-Right chest, inf (txpbx)   Squamous cell carcinoma of skin 01/21/2018   well diff- Right chest,ulnar (txpbx), well diff- right upper chest (txpbx), in situ-Right ant. crown (txpbx)   Squamous cell carcinoma of skin 08/02/2020   well diff-left lower leg-inferior (Txpbx)   Squamous cell carcinoma of skin 08/02/2020   well diff-right lower leg-mid (txpbx)   Squamous cell carcinoma of skin 08/02/2020   well diff-left chest upper   Squamous cell carcinoma of skin 08/02/2020   well diff-mid parietal scalp (MOHS)   Squamous cell carcinoma of skin 08/02/2020   well diff-right foot inner(txpbx)   Squamous cell carcinoma of skin 08/02/2020   well diff- left lower leg medial (txpbx)   Squamous cell carcinoma of skin  08/02/2020   well diff-left lower leg anterior (txpbx)   Squamous cell carcinoma of skin 08/02/2020   well diff-left lower leg medial (txpbx)   Squamous cell carcinoma of skin 08/02/2020   well diff-right forearm-posterior (txpbx)    Current Outpatient Medications:    acetaminophen  (TYLENOL ) 500 MG tablet, Take 1,000 mg by mouth every 6 (six) hours as needed (for pain.). , Disp: , Rfl:    alendronate  (FOSAMAX ) 70 MG tablet, TAKE 1 TABLET EVERY WEEK, Disp: 12 tablet, Rfl: 2   ALPRAZolam  (XANAX ) 0.5 MG tablet, Take 1 tablet (0.5 mg total) by mouth at bedtime., Disp: 90 tablet, Rfl: 1   Biotin w/ Vitamins C & E (HAIR SKIN & NAILS GUMMIES PO), Take 2 tablets by mouth daily., Disp: , Rfl:    Calcium Carb-Cholecalciferol  (CALCIUM 600 + D PO), Take 1 tablet by mouth 2 (two) times daily., Disp: , Rfl:    cetirizine  (ZYRTEC ) 10 MG tablet, Take 1 tablet (10 mg total) by mouth daily as needed for allergies., Disp: 90 tablet, Rfl: 3   Cholecalciferol  (VITAMIN D ) 50 MCG (2000 UT) tablet, Take 2,000 Units by mouth daily., Disp: , Rfl:  Clobetasol  Prop Emollient Base (CLOBETASOL  PROPIONATE E) 0.05 % emollient cream, Apply 1 application. topically 2 (two) times daily. (Patient taking differently: Apply 1 application  topically 2 (two) times daily as needed (rash).), Disp: 180 g, Rfl: 1   clotrimazole -betamethasone  (LOTRISONE ) cream, Apply 1 application topically 2 (two) times daily. (Patient taking differently: Apply 1 application  topically daily as needed (Rash).), Disp: 30 g, Rfl: 0   famotidine  (PEPCID ) 20 MG tablet, Take 1 tablet (20 mg total) by mouth 2 (two) times daily., Disp: 60 tablet, Rfl: 5   fluorouracil (EFUDEX) 5 % cream, Apply 1 application topically daily as needed (cancer spots). , Disp: , Rfl: 0   fluticasone  (FLONASE ) 50 MCG/ACT nasal spray, Place 2 sprays into both nostrils daily., Disp: 16 g, Rfl: 6   gabapentin  (NEURONTIN ) 100 MG capsule, Take 1 capsule (100 mg total) by mouth 3 (three)  times daily as needed., Disp: 90 capsule, Rfl: 3   hydrocortisone cream 1 %, Apply 1 Application topically 2 (two) times daily as needed for itching., Disp: , Rfl:    hydrOXYzine  (VISTARIL ) 25 MG capsule, Take 1 capsule (25 mg total) by mouth every 6 (six) hours as needed for itching., Disp: 30 capsule, Rfl: 0   ketoconazole  (NIZORAL ) 2 % cream, Apply 1 Application topically 2 (two) times daily., Disp: 15 g, Rfl: 0   levothyroxine  (SYNTHROID ) 112 MCG tablet, TAKE ONE (1) TABLET BY MOUTH EVERY DAY, Disp: 90 tablet, Rfl: 2   losartan  (COZAAR ) 25 MG tablet, Take 1 tablet (25 mg total) by mouth daily., Disp: 90 tablet, Rfl: 4   metoprolol  succinate (TOPROL -XL) 25 MG 24 hr tablet, Take 1 tablet (25 mg total) by mouth daily., Disp: 90 tablet, Rfl: 1   Multiple Vitamins-Minerals (MULTIVITAMIN WITH MINERALS) tablet, Take 1 tablet by mouth daily., Disp: , Rfl:    mupirocin  ointment (BACTROBAN ) 2 %, Apply 1 application topically daily as needed (Cancer spots)., Disp: 22 g, Rfl: 3   ondansetron  (ZOFRAN ) 4 MG tablet, Take 1 tablet (4 mg total) by mouth every 8 (eight) hours as needed for nausea or vomiting., Disp: 90 tablet, Rfl: 1   pantoprazole  (PROTONIX ) 40 MG tablet, Take 1 tablet (40 mg total) by mouth daily., Disp: 90 tablet, Rfl: 1   predniSONE  (DELTASONE ) 10 MG tablet, Days 1-4 take 4 tablets (40 mg) daily  Days 5-8 take 3 tablets (30 mg) daily, Days 9-11 take 2 tablets (20 mg) daily, Days 12-14 take 1 tablet (10 mg) daily., Disp: 37 tablet, Rfl: 0   Probiotic Product (PROBIOTIC PO), Take 1 capsule by mouth daily., Disp: , Rfl:    promethazine -dextromethorphan (PROMETHAZINE -DM) 6.25-15 MG/5ML syrup, Take 5 mLs by mouth 3 (three) times daily as needed for cough., Disp: 240 mL, Rfl: 0   SUMAtriptan  (IMITREX ) 50 MG tablet, Take 1 tablet (50 mg total) by mouth every 2 (two) hours as needed for migraine. May repeat in 2 hours if headache persists or recurs., Disp: 10 tablet, Rfl: 0   ursodiol  (ACTIGALL ) 300 MG  capsule, Take 3 capsules (900 mg total) by mouth daily., Disp: 90 capsule, Rfl: 5   vitamin C (ASCORBIC ACID) 250 MG tablet, Take 250 mg by mouth daily., Disp: , Rfl:    Vitamin D , Ergocalciferol , 50000 units CAPS, TAKE 1 CAPSULE BY MOUTH ONCE A WEEK, Disp: 12 capsule, Rfl: 1   vitamin E 180 MG (400 UNITS) capsule, Take 400 Units by mouth daily., Disp: , Rfl:  Social History   Socioeconomic History   Marital  status: Married    Spouse name: Cassidy Barber   Number of children: 1   Years of education: 12   Highest education level: 12th grade  Occupational History   Occupation: Disability    Employer: [employer]    Comment: Immunologist  Tobacco Use   Smoking status: Never   Smokeless tobacco: Never  Vaping Use   Vaping status: Never Used  Substance and Sexual Activity   Alcohol use: No    Alcohol/week: 0.0 standard drinks of alcohol   Drug use: No   Sexual activity: Yes    Birth control/protection: None  Other Topics Concern   Not on file  Social History Narrative   Patient lives in a two story home with her husband. She has an adult son. She is retired from being an Environmental health practitioner for 30 years.    Social Drivers of Corporate investment banker Strain: Low Risk  (01/11/2024)   Overall Financial Resource Strain (CARDIA)    Difficulty of Paying Living Expenses: Not hard at all  Food Insecurity: No Food Insecurity (01/11/2024)   Hunger Vital Sign    Worried About Running Out of Food in the Last Year: Never true    Ran Out of Food in the Last Year: Never true  Transportation Needs: No Transportation Needs (01/11/2024)   PRAPARE - Administrator, Civil Service (Medical): No    Lack of Transportation (Non-Medical): No  Physical Activity: Insufficiently Active (01/11/2024)   Exercise Vital Sign    Days of Exercise per Week: 1 day    Minutes of Exercise per Session: 20 min  Stress: No Stress Concern Present (01/11/2024)   Cassidy Barber of Occupational  Health - Occupational Stress Questionnaire    Feeling of Stress : Not at all  Social Connections: Socially Integrated (01/11/2024)   Social Connection and Isolation Panel [NHANES]    Frequency of Communication with Friends and Family: Once a week    Frequency of Social Gatherings with Friends and Family: Three times a week    Attends Religious Services: More than 4 times per year    Active Member of Clubs or Organizations: No    Attends Banker Meetings: More than 4 times per year    Marital Status: Married  Catering manager Violence: Not At Risk (07/04/2023)   Humiliation, Afraid, Rape, and Kick questionnaire    Fear of Current or Ex-Partner: No    Emotionally Abused: No    Physically Abused: No    Sexually Abused: No   Family History  Problem Relation Age of Onset   Prostate cancer Father    Colon cancer Father    Colon cancer Sister    Lung cancer Sister    Healthy Son    Alcohol abuse Brother    Allergic rhinitis Neg Hx    Asthma Neg Hx    Eczema Neg Hx    Urticaria Neg Hx     Objective: Office vital signs reviewed. BP 134/86   Pulse 88   Temp 98.3 F (36.8 C)   Ht 5\' 4"  (1.626 m)   Wt 143 lb (64.9 kg)   SpO2 97%   BMI 24.55 kg/m   Physical Examination:  General: Awake, alert, well nourished, No acute distress HEENT: Left TM with small, yellow rounded lesion at the base. ?  Otosclerosis.  Right TM obscured by cerumen  Assessment/ Plan: 73 y.o. female   Abnormal tympanic membrane of left ear - Plan: Ambulatory referral to  ENT  Bilateral hearing loss, unspecified hearing loss type - Plan: Ambulatory referral to ENT  Trigeminal herpes zoster - Plan: Ambulatory referral to ENT  Immunocompromised [facility]) - Plan: Ambulatory referral to ENT  ?  Otosclerosis of the left TM.  She had her hearing ears irrigated today.  I do question if perhaps she has had damage from the herpes zoster that has caused her ongoing hearing issues.  Referral to ENT to further  explore.  Keep follow up with her optometrist/ophthalmologist   Eliodoro Guerin, DO [facility] [phone]

## 2024-03-13 ENCOUNTER — Encounter (HOSPITAL_COMMUNITY)
Admission: RE | Admit: 2024-03-13 | Discharge: 2024-03-13 | Disposition: A | Discharge status: Home / Self Care | Source: Ambulatory Visit | Attending: Gastroenterology | Admitting: Gastroenterology

## 2024-03-16 ENCOUNTER — Other Ambulatory Visit: Payer: Self-pay | Admitting: Family

## 2024-03-16 ENCOUNTER — Telehealth: Payer: Self-pay

## 2024-03-16 DIAGNOSIS — I10 Essential (primary) hypertension: Secondary | ICD-10-CM

## 2024-03-16 NOTE — Telephone Encounter (Signed)
 Referral sent to: [facility] [address] - [place] [address] [phone]  I have also sent Patient a MyChart Message with Office contact information :)

## 2024-03-16 NOTE — Telephone Encounter (Signed)
 Okay sounds good, please send along to the referral team so they can check on the referral

## 2024-03-16 NOTE — Telephone Encounter (Signed)
 Copied from CRM [id number]. Topic: Clinical - Medical Advice >> Mar 16, 2024  9:34 AM [name] wrote: Reason for CRM:   Patient called in stating a referral was placed for her to see a ENT specialist but she had nopt received a call. Agent was given a 10 day possible turnaround timeframe from CAL which she relayed to the patient. Patient stated she wanted the doctor to know she is in the same position with being able to hear very little and stated she will call her previous ENT doctor to see if she can get an appointment; If she does she will inform the office.  Callback Number: [phone]

## 2024-03-17 ENCOUNTER — Other Ambulatory Visit: Payer: Self-pay

## 2024-03-17 ENCOUNTER — Encounter (INDEPENDENT_AMBULATORY_CARE_PROVIDER_SITE_OTHER): Payer: Self-pay | Admitting: *Deleted

## 2024-03-17 ENCOUNTER — Ambulatory Visit (HOSPITAL_BASED_OUTPATIENT_CLINIC_OR_DEPARTMENT_OTHER): Payer: Self-pay | Admitting: Certified Registered Nurse Anesthetist

## 2024-03-17 ENCOUNTER — Ambulatory Visit (HOSPITAL_COMMUNITY): Payer: Self-pay | Admitting: Certified Registered Nurse Anesthetist

## 2024-03-17 ENCOUNTER — Encounter (HOSPITAL_COMMUNITY): Payer: Self-pay | Admitting: Gastroenterology

## 2024-03-17 ENCOUNTER — Ambulatory Visit (HOSPITAL_COMMUNITY)
Admission: RE | Admit: 2024-03-17 | Discharge: 2024-03-17 | Disposition: A | Discharge status: Home / Self Care | Attending: Gastroenterology | Admitting: Gastroenterology

## 2024-03-17 ENCOUNTER — Encounter (HOSPITAL_COMMUNITY)
Admission: RE | Disposition: A | Discharge status: Home / Self Care | Payer: Self-pay | Source: Home / Self Care | Attending: Gastroenterology

## 2024-03-17 DIAGNOSIS — K319 Disease of stomach and duodenum, unspecified: Secondary | ICD-10-CM

## 2024-03-17 DIAGNOSIS — I10 Essential (primary) hypertension: Secondary | ICD-10-CM

## 2024-03-17 DIAGNOSIS — K449 Diaphragmatic hernia without obstruction or gangrene: Secondary | ICD-10-CM

## 2024-03-17 DIAGNOSIS — R569 Unspecified convulsions: Secondary | ICD-10-CM | POA: Diagnosis not present

## 2024-03-17 DIAGNOSIS — K9189 Other postprocedural complications and disorders of digestive system: Secondary | ICD-10-CM

## 2024-03-17 DIAGNOSIS — Z85038 Personal history of other malignant neoplasm of large intestine: Secondary | ICD-10-CM | POA: Insufficient documentation

## 2024-03-17 DIAGNOSIS — Z1211 Encounter for screening for malignant neoplasm of colon: Secondary | ICD-10-CM | POA: Insufficient documentation

## 2024-03-17 DIAGNOSIS — Z85828 Personal history of other malignant neoplasm of skin: Secondary | ICD-10-CM | POA: Insufficient documentation

## 2024-03-17 DIAGNOSIS — F419 Anxiety disorder, unspecified: Secondary | ICD-10-CM | POA: Diagnosis not present

## 2024-03-17 DIAGNOSIS — C189 Malignant neoplasm of colon, unspecified: Secondary | ICD-10-CM

## 2024-03-17 DIAGNOSIS — Z980 Intestinal bypass and anastomosis status: Secondary | ICD-10-CM | POA: Diagnosis not present

## 2024-03-17 DIAGNOSIS — Z944 Liver transplant status: Secondary | ICD-10-CM | POA: Insufficient documentation

## 2024-03-17 DIAGNOSIS — K3189 Other diseases of stomach and duodenum: Secondary | ICD-10-CM | POA: Diagnosis not present

## 2024-03-17 DIAGNOSIS — Z9049 Acquired absence of other specified parts of digestive tract: Secondary | ICD-10-CM | POA: Insufficient documentation

## 2024-03-17 DIAGNOSIS — K648 Other hemorrhoids: Secondary | ICD-10-CM | POA: Insufficient documentation

## 2024-03-17 DIAGNOSIS — E039 Hypothyroidism, unspecified: Secondary | ICD-10-CM

## 2024-03-17 DIAGNOSIS — K2950 Unspecified chronic gastritis without bleeding: Secondary | ICD-10-CM | POA: Insufficient documentation

## 2024-03-17 DIAGNOSIS — Z1509 Genetic susceptibility to other malignant neoplasm: Secondary | ICD-10-CM | POA: Insufficient documentation

## 2024-03-17 DIAGNOSIS — Z13810 Encounter for screening for upper gastrointestinal disorder: Secondary | ICD-10-CM | POA: Diagnosis not present

## 2024-03-17 DIAGNOSIS — K219 Gastro-esophageal reflux disease without esophagitis: Secondary | ICD-10-CM | POA: Diagnosis not present

## 2024-03-17 DIAGNOSIS — K6389 Other specified diseases of intestine: Secondary | ICD-10-CM | POA: Diagnosis not present

## 2024-03-17 HISTORY — PX: ESOPHAGOGASTRODUODENOSCOPY: SHX5428

## 2024-03-17 HISTORY — PX: COLONOSCOPY: SHX5424

## 2024-03-17 LAB — HM COLONOSCOPY

## 2024-03-17 SURGERY — COLONOSCOPY
Anesthesia: Monitor Anesthesia Care

## 2024-03-17 MED ORDER — STERILE WATER FOR IRRIGATION IR SOLN
Status: DC | PRN
  Administered 2024-03-17: 60 mL

## 2024-03-17 MED ORDER — GLYCOPYRROLATE PF 0.2 MG/ML IJ SOSY
PREFILLED_SYRINGE | INTRAMUSCULAR | Status: DC | PRN
Start: 2024-03-17 — End: 2024-03-17
  Administered 2024-03-17: .1 mg via INTRAVENOUS

## 2024-03-17 MED ORDER — LACTATED RINGERS IV SOLN: INTRAVENOUS | Status: DC

## 2024-03-17 MED ORDER — PROPOFOL 10 MG/ML IV BOLUS
INTRAVENOUS | Status: DC | PRN
  Administered 2024-03-17 (×2): 50 mg via INTRAVENOUS
  Administered 2024-03-17: 70 mg via INTRAVENOUS
  Administered 2024-03-17: 50 mg via INTRAVENOUS
  Administered 2024-03-17: 80 mg via INTRAVENOUS

## 2024-03-17 MED ORDER — GLYCOPYRROLATE PF 0.2 MG/ML IJ SOSY
PREFILLED_SYRINGE | INTRAMUSCULAR | Status: AC
  Filled 2024-03-17: qty 1

## 2024-03-17 MED ORDER — PROPOFOL 500 MG/50ML IV EMUL
INTRAVENOUS | Status: AC
  Filled 2024-03-17: qty 50

## 2024-03-17 NOTE — Interval H&P Note (Signed)
 History and Physical Interval Note:  03/17/2024 7:28 AM  Cassidy Barber  has presented today for surgery, with the diagnosis of [facility] SYNDROME;GAVE.  The various methods of treatment have been discussed with the patient and family. After consideration of risks, benefits and other options for treatment, the patient has consented to  Procedure(s) with comments: COLONOSCOPY (N/A) - 7:30A,;ASA 3 EGD (ESOPHAGOGASTRODUODENOSCOPY) (N/A) - 7:30AM;ASA 3 as a surgical intervention.  The patient's history has been reviewed, patient examined, no change in status, stable for surgery.  I have reviewed the patient's chart and labs.  Questions were answered to the patient's satisfaction.     Rose Hegner Castaneda Mayorga

## 2024-03-17 NOTE — Anesthesia Postprocedure Evaluation (Signed)
 Anesthesia Post Note  Patient: Cassidy Barber  Procedure(s) Performed: COLONOSCOPY EGD (ESOPHAGOGASTRODUODENOSCOPY)  Patient location during evaluation: Phase II Anesthesia Type: MAC Level of consciousness: awake Pain management: pain level controlled Vital Signs Assessment: post-procedure vital signs reviewed and stable Respiratory status: spontaneous breathing and respiratory function stable Cardiovascular status: blood pressure returned to baseline and stable Postop Assessment: no headache and no apparent nausea or vomiting Anesthetic complications: no Comments: Late entry   No notable events documented.   Last Vitals:  Vitals:   03/17/24 0702 03/17/24 0801  BP: 137/86 123/78  Pulse: 89 98  Resp: 14 16  Temp: 36.6 C 36.4 C  SpO2: 100% 100%    Last Pain:  Vitals:   03/17/24 0801  TempSrc: Axillary  PainSc: 0-No pain                 Coretha Dew

## 2024-03-17 NOTE — Op Note (Signed)
 [facility] Patient Name: Cassidy Barber Procedure Date: 03/17/2024 7:10 AM MRN: [id number] Date of Birth: [birth year] Attending MD: Samantha Cress , , [id number] CSN: [id number] Age: 73 Admit Type: Outpatient Procedure:                Colonoscopy Indications:              Lynch Syndrome Providers:                Samantha Cress, Vonna Guardian, Annell Barrow Referring MD:              Medicines:                Monitored Anesthesia Care Complications:            No immediate complications. Estimated Blood Loss:     Estimated blood loss: none. Procedure:                Pre-Anesthesia Assessment:                           - Prior to the procedure, a History and Physical                            was performed, and patient medications, allergies                            and sensitivities were reviewed. The patient's                            tolerance of previous anesthesia was reviewed.                           - The risks and benefits of the procedure and the                            sedation options and risks were discussed with the                            patient. All questions were answered and informed                            consent was obtained.                           - ASA Grade Assessment: III - A patient with severe                            systemic disease.                           After obtaining informed consent, the colonoscope                            was passed under direct vision. Throughout the                            procedure, the patient's blood pressure, pulse, and  oxygen saturations were monitored continuously. The                            [id number]-[id number] ([id number]) scope was introduced through                            the anus and advanced to the the terminal ileum.                            The colonoscopy was performed without difficulty.                            The patient tolerated the procedure well.  The                            quality of the bowel preparation was good. Scope In: 7:48:14 AM Scope Out: 7:57:18 AM Scope Withdrawal Time: 0 hours 7 minutes 46 seconds  Total Procedure Duration: 0 hours 9 minutes 4 seconds  Findings:      The perianal and digital rectal examinations were normal.      The neo-terminal ileum appeared normal.      There was evidence of a prior end-to-side ileo-colonic anastomosis in       the descending colon. This was patent and was characterized by       congestion. The anastomosis was traversed.      The retroflexed view of the distal rectum and anal verge was normal and       showed no anal or rectal abnormalities. Impression:               - The examined portion of the ileum was normal.                           - Patent end-to-side ileo-colonic anastomosis,                            characterized by congestion.                           - The distal rectum and anal verge are normal on                            retroflexion view.                           - No specimens collected. Moderate Sedation:      Per Anesthesia Care Recommendation:           - Discharge patient to home (ambulatory).                           - Resume previous diet.                           - Repeat colonoscopy in 2 years for surveillance. Procedure Code(s):        --- Professional ---  40981, Colonoscopy, flexible; diagnostic, including                            collection of specimen(s) by brushing or washing,                            when performed (separate procedure) Diagnosis Code(s):        --- Professional ---                           Z98.0, Intestinal bypass and anastomosis status                           Z15.09, Genetic susceptibility to other malignant                            neoplasm CPT copyright 2022 American Medical Association. All rights reserved. The codes documented in this report are preliminary and upon coder review  may  be revised to meet current compliance requirements. Samantha Cress, MD Samantha Cress,  03/17/2024 8:05:19 AM This report has been signed electronically. Number of Addenda: 0

## 2024-03-17 NOTE — Anesthesia Preprocedure Evaluation (Signed)
 Anesthesia Evaluation  Patient identified by MRN, date of birth, ID band Patient awake    Reviewed: Allergy & Precautions, H&P , NPO status , Patient's Chart, lab work & pertinent test results, reviewed documented beta blocker date and time   Airway Mallampati: II  TM Distance: >3 FB Neck ROM: full    Dental no notable dental hx.    Pulmonary neg pulmonary ROS   Pulmonary exam normal breath sounds clear to auscultation       Cardiovascular Exercise Tolerance: Good hypertension, + Peripheral Vascular Disease   Rhythm:regular Rate:Normal     Neuro/Psych Seizures -,  PSYCHIATRIC DISORDERS Anxiety        GI/Hepatic ,GERD  ,,(+) Hepatitis -  Endo/Other  Hypothyroidism    Renal/GU Renal disease  negative genitourinary   Musculoskeletal   Abdominal   Peds  Hematology  (+) Blood dyscrasia, anemia   Anesthesia Other Findings   Reproductive/Obstetrics negative OB ROS                             Anesthesia Physical Anesthesia Plan  ASA: 3  Anesthesia Plan: General   Post-op Pain Management:    Induction:   PONV Risk Score and Plan: Propofol  infusion  Airway Management Planned:   Additional Equipment:   Intra-op Plan:   Post-operative Plan:   Informed Consent: I have reviewed the patients History and Physical, chart, labs and discussed the procedure including the risks, benefits and alternatives for the proposed anesthesia with the patient or authorized representative who has indicated his/her understanding and acceptance.     Dental Advisory Given  Plan Discussed with: CRNA  Anesthesia Plan Comments:        Anesthesia Quick Evaluation

## 2024-03-17 NOTE — Discharge Instructions (Addendum)
 You are being discharged to home.  Resume your previous diet.  We are waiting for your pathology results.  Your physician has recommended a repeat upper endoscopy in two years for surveillance.  Your physician has recommended a repeat colonoscopy in two years for surveillance.

## 2024-03-17 NOTE — Transfer of Care (Signed)
 Immediate Anesthesia Transfer of Care Note  Patient: Cassidy Barber  Procedure(s) Performed: COLONOSCOPY EGD (ESOPHAGOGASTRODUODENOSCOPY)  Patient Location: PACU  Anesthesia Type:MAC  Level of Consciousness: awake and drowsy  Airway & Oxygen Therapy: Patient Spontanous Breathing  Post-op Assessment: Report given to RN, Post -op Vital signs reviewed and stable, and Patient able to stick tongue midline  Post vital signs: Reviewed and stable  Last Vitals:  Vitals Value Taken Time  BP 123/78 03/17/24 0801  Temp 36.4 C 03/17/24 0801  Pulse 98 03/17/24 0801  Resp 16 03/17/24 0801  SpO2 100 % 03/17/24 0801    Last Pain:  Vitals:   03/17/24 0801  TempSrc: Axillary  PainSc: 0-No pain         Complications: No notable events documented.

## 2024-03-17 NOTE — Op Note (Signed)
 [facility] Patient Name: Cassidy Barber Procedure Date: 03/17/2024 7:11 AM MRN: [id number] Date of Birth: [birth year] Attending MD: Samantha Cress , , [id number] CSN: [id number] Age: 73 Admit Type: Outpatient Procedure:                Upper GI endoscopy Indications:              Hereditary nonpolyposis colorectal cancer ([place]                            Syndrome) Providers:                Samantha Cress, Vonna Guardian, Annell Barrow Referring MD:              Medicines:                Monitored Anesthesia Care Complications:            No immediate complications. Estimated Blood Loss:     Estimated blood loss: none. Procedure:                Pre-Anesthesia Assessment:                           - Prior to the procedure, a History and Physical                            was performed, and patient medications, allergies                            and sensitivities were reviewed. The patient's                            tolerance of previous anesthesia was reviewed.                           - The risks and benefits of the procedure and the                            sedation options and risks were discussed with the                            patient. All questions were answered and informed                            consent was obtained.                           - ASA Grade Assessment: II - A patient with mild                            systemic disease.                           After obtaining informed consent, the endoscope was                            passed under direct vision. Throughout the  procedure, the patient's blood pressure, pulse, and                            oxygen saturations were monitored continuously. The                            [id number]-H190 ([id number]) scope was introduced through the                            mouth, and advanced to the second part of duodenum.                            The upper GI endoscopy was accomplished  without                            difficulty. The patient tolerated the procedure                            well. Scope In: 7:37:05 AM Scope Out: 7:43:16 AM Total Procedure Duration: 0 hours 6 minutes 11 seconds  Findings:      A 1 cm hiatal hernia was present.      The entire examined stomach was normal. Imaging was performed using       white light and narrow band imaging to visualize the mucosa. Biopsies       were taken with a cold forceps for histology.      The examined duodenum was normal. Impression:               - 1 cm hiatal hernia.                           - Normal stomach. Biopsied.                           - Normal examined duodenum. Moderate Sedation:      Per Anesthesia Care Recommendation:           - Discharge patient to home (ambulatory).                           - Resume previous diet.                           - Await pathology results.                           - Repeat upper endoscopy in 2 years for                            surveillance. Procedure Code(s):        --- Professional ---                           [phone], Esophagogastroduodenoscopy, flexible,                            transoral; with biopsy, single or multiple Diagnosis Code(s):        ---  Professional ---                           K44.9, Diaphragmatic hernia without obstruction or                            gangrene                           C18.9, Malignant neoplasm of colon, unspecified                           Z15.09, Genetic susceptibility to other malignant                            neoplasm CPT copyright 2022 [employer]. All rights reserved. The codes documented in this report are preliminary and upon coder review may  be revised to meet current compliance requirements. Samantha Cress, MD Samantha Cress,  03/17/2024 8:02:07 AM This report has been signed electronically. Number of Addenda: 0

## 2024-03-18 ENCOUNTER — Encounter (HOSPITAL_COMMUNITY): Payer: Self-pay | Admitting: Gastroenterology

## 2024-03-18 LAB — SURGICAL PATHOLOGY

## 2024-03-24 DIAGNOSIS — Z944 Liver transplant status: Secondary | ICD-10-CM | POA: Diagnosis not present

## 2024-03-24 DIAGNOSIS — D849 Immunodeficiency, unspecified: Secondary | ICD-10-CM | POA: Diagnosis not present

## 2024-03-30 DIAGNOSIS — Z85828 Personal history of other malignant neoplasm of skin: Secondary | ICD-10-CM | POA: Diagnosis not present

## 2024-03-30 DIAGNOSIS — C44629 Squamous cell carcinoma of skin of left upper limb, including shoulder: Secondary | ICD-10-CM | POA: Diagnosis not present

## 2024-03-30 DIAGNOSIS — Z944 Liver transplant status: Secondary | ICD-10-CM | POA: Diagnosis not present

## 2024-03-30 DIAGNOSIS — C44511 Basal cell carcinoma of skin of breast: Secondary | ICD-10-CM | POA: Diagnosis not present

## 2024-03-30 DIAGNOSIS — Z08 Encounter for follow-up examination after completed treatment for malignant neoplasm: Secondary | ICD-10-CM | POA: Diagnosis not present

## 2024-03-30 DIAGNOSIS — C4442 Squamous cell carcinoma of skin of scalp and neck: Secondary | ICD-10-CM | POA: Diagnosis not present

## 2024-03-30 DIAGNOSIS — Z1283 Encounter for screening for malignant neoplasm of skin: Secondary | ICD-10-CM | POA: Diagnosis not present

## 2024-03-30 DIAGNOSIS — C44521 Squamous cell carcinoma of skin of breast: Secondary | ICD-10-CM | POA: Diagnosis not present

## 2024-03-30 DIAGNOSIS — L570 Actinic keratosis: Secondary | ICD-10-CM | POA: Diagnosis not present

## 2024-04-03 DIAGNOSIS — H906 Mixed conductive and sensorineural hearing loss, bilateral: Secondary | ICD-10-CM | POA: Diagnosis not present

## 2024-04-03 DIAGNOSIS — B029 Zoster without complications: Secondary | ICD-10-CM | POA: Diagnosis not present

## 2024-04-03 DIAGNOSIS — H6991 Unspecified Eustachian tube disorder, right ear: Secondary | ICD-10-CM | POA: Diagnosis not present

## 2024-04-07 DIAGNOSIS — I10 Essential (primary) hypertension: Secondary | ICD-10-CM | POA: Diagnosis not present

## 2024-04-07 DIAGNOSIS — E039 Hypothyroidism, unspecified: Secondary | ICD-10-CM | POA: Diagnosis not present

## 2024-04-07 DIAGNOSIS — Z7952 Long term (current) use of systemic steroids: Secondary | ICD-10-CM | POA: Diagnosis not present

## 2024-04-07 DIAGNOSIS — Z85828 Personal history of other malignant neoplasm of skin: Secondary | ICD-10-CM | POA: Diagnosis not present

## 2024-04-07 DIAGNOSIS — Z881 Allergy status to other antibiotic agents status: Secondary | ICD-10-CM | POA: Diagnosis not present

## 2024-04-07 DIAGNOSIS — R569 Unspecified convulsions: Secondary | ICD-10-CM | POA: Diagnosis not present

## 2024-04-07 DIAGNOSIS — Z944 Liver transplant status: Secondary | ICD-10-CM | POA: Diagnosis not present

## 2024-04-07 DIAGNOSIS — Z9689 Presence of other specified functional implants: Secondary | ICD-10-CM | POA: Diagnosis not present

## 2024-04-07 DIAGNOSIS — T85520A Displacement of bile duct prosthesis, initial encounter: Secondary | ICD-10-CM | POA: Diagnosis not present

## 2024-04-07 DIAGNOSIS — Z4659 Encounter for fitting and adjustment of other gastrointestinal appliance and device: Secondary | ICD-10-CM | POA: Diagnosis not present

## 2024-04-07 DIAGNOSIS — K831 Obstruction of bile duct: Secondary | ICD-10-CM | POA: Diagnosis not present

## 2024-04-07 DIAGNOSIS — Z885 Allergy status to narcotic agent status: Secondary | ICD-10-CM | POA: Diagnosis not present

## 2024-04-07 DIAGNOSIS — Z79899 Other long term (current) drug therapy: Secondary | ICD-10-CM | POA: Diagnosis not present

## 2024-04-07 DIAGNOSIS — Z79623 Long term (current) use of mammalian target of rapamycin (mtor) inhibitor: Secondary | ICD-10-CM | POA: Diagnosis not present

## 2024-04-07 DIAGNOSIS — Z4689 Encounter for fitting and adjustment of other specified devices: Secondary | ICD-10-CM | POA: Diagnosis not present

## 2024-04-09 ENCOUNTER — Ambulatory Visit (INDEPENDENT_AMBULATORY_CARE_PROVIDER_SITE_OTHER): Payer: Self-pay | Admitting: Gastroenterology

## 2024-04-11 DIAGNOSIS — K080 Exfoliation of teeth due to systemic causes: Secondary | ICD-10-CM | POA: Diagnosis not present

## 2024-04-13 ENCOUNTER — Other Ambulatory Visit: Payer: Self-pay | Admitting: Family

## 2024-04-13 DIAGNOSIS — F1320 Sedative, hypnotic or anxiolytic dependence, uncomplicated: Secondary | ICD-10-CM

## 2024-04-13 DIAGNOSIS — Z79899 Other long term (current) drug therapy: Secondary | ICD-10-CM

## 2024-04-13 DIAGNOSIS — D849 Immunodeficiency, unspecified: Secondary | ICD-10-CM | POA: Diagnosis not present

## 2024-04-13 DIAGNOSIS — Z944 Liver transplant status: Secondary | ICD-10-CM | POA: Diagnosis not present

## 2024-04-13 DIAGNOSIS — F411 Generalized anxiety disorder: Secondary | ICD-10-CM

## 2024-04-13 NOTE — Telephone Encounter (Signed)
 Explained requested refill was for a controlled medication that can only be done during a visit, appt made for the earliest time 05/08/24. Pt voiced understanding.

## 2024-04-13 NOTE — Telephone Encounter (Signed)
 Copied from CRM [id number]. Topic: Clinical - Medication Refill >> Apr 13, 2024 11:10 AM Tiffany H wrote: Medication: ALPRAZolam  (XANAX ) 0.5 MG tablet [[id number]]  Has the patient contacted their pharmacy? Yes (Agent: If no, request that the patient contact the pharmacy for the refill. If patient does not wish to contact the pharmacy document the reason why and proceed with request.) (Agent: If yes, when and what did the pharmacy advise?)  This is the patient's preferred pharmacy:  [facility] [place] - [address] [place] [address] Phone: [phone] Fax: [phone]  Is this the correct pharmacy for this prescription? Yes If no, delete pharmacy and type the correct one.   Has the prescription been filled recently? Yes  Is the patient out of the medication? Yes  Has the patient been seen for an appointment in the last year OR does the patient have an upcoming appointment? Yes  Can we respond through MyChart? Yes  Agent: Please be advised that Rx refills may take up to 3 business days. We ask that you follow-up with your pharmacy.

## 2024-04-15 DIAGNOSIS — K080 Exfoliation of teeth due to systemic causes: Secondary | ICD-10-CM | POA: Diagnosis not present

## 2024-04-16 ENCOUNTER — Other Ambulatory Visit: Payer: Self-pay | Admitting: Family

## 2024-04-16 DIAGNOSIS — B027 Disseminated zoster: Secondary | ICD-10-CM

## 2024-04-20 ENCOUNTER — Encounter: Payer: Self-pay | Admitting: Dermatology

## 2024-04-23 ENCOUNTER — Ambulatory Visit: Admitting: Dermatology

## 2024-04-23 ENCOUNTER — Encounter: Payer: Self-pay | Admitting: Dermatology

## 2024-04-23 VITALS — BP 121/76 | HR 76 | Temp 99.3°F

## 2024-04-23 DIAGNOSIS — C4492 Squamous cell carcinoma of skin, unspecified: Secondary | ICD-10-CM

## 2024-04-23 DIAGNOSIS — L814 Other melanin hyperpigmentation: Secondary | ICD-10-CM

## 2024-04-23 DIAGNOSIS — C44629 Squamous cell carcinoma of skin of left upper limb, including shoulder: Secondary | ICD-10-CM

## 2024-04-23 DIAGNOSIS — L579 Skin changes due to chronic exposure to nonionizing radiation, unspecified: Secondary | ICD-10-CM

## 2024-04-23 NOTE — Progress Notes (Signed)
 Follow-Up Visit   Subjective  Cassidy Barber is a 73 y.o. female who presents for the following: Mohs of a Moderately Differentiated Squamous Cell Carcinoma on the left distal posterior upper arm, referred by Dr. Steen Eden.   The following portions of the chart were reviewed this encounter and updated as appropriate: medications, allergies, medical history  Review of Systems:  No other skin or systemic complaints except as noted in HPI or Assessment and Plan.  Objective  Well appearing patient in no apparent distress; mood and affect are within normal limits.  A focused examination was performed of the following areas: Left distal posterior upper arm Relevant physical exam findings are noted in the Assessment and Plan.   Left Distal Posterior Upper Arm Hyperkeratotic plaque   Assessment & Plan   SQUAMOUS CELL CARCINOMA OF SKIN Left Distal Posterior Upper Arm Mohs surgery  Consent obtained: written  Anticoagulation: Was the anticoagulation regimen changed prior to Mohs? No    Anesthesia: Anesthesia method: local infiltration Local anesthetic: lidocaine  1% WITH epi  Procedure Details: Timeout: pre-procedure verification complete Biopsy accession number: [id number] Biopsy lab: [facility] Date of biopsy: 03/30/2024 Pre-Op diagnosis: squamous cell carcinoma SCC subtype: moderately differentiated MohsAIQ Surgical site (if tumor spans multiple areas, please select predominant area): upper extremity Surgery side: left Surgical site (from skin exam): Left Distal Posterior Upper Arm Pre-operative length (cm): 1.8 Pre-operative width (cm): 2.1 Indications for Mohs surgery: anatomic location where tissue conservation is critical Previously treated? No    Micrographic Surgery Details: Post-operative length (cm): 2.7 Post-operative width (cm): 3.5 Number of Mohs stages: 1 Cumulative additional sections past 5 per stage: 0 Post surgery depth of defect: subcutaneous  fat  Stage 1    Tumor features identified on Mohs section: no tumor identified  Reconstruction: Was the defect reconstructed? Yes   Was reconstruction performed by the same Mohs surgeon? Yes   Setting of reconstruction: outpatient office When was reconstruction performed? same day  Opioids: Did the patient receive a prescription for opioid/narcotic related to Mohs surgery?: No    Antibiotics: Does patient meet AHA guidelines for endocarditis?: No   Does patient meet AHA guidelines for orthopedic prophylaxis?: No   Were antibiotics given on the day of surgery?: No   Did surgery breach mucosa, expose cartilage/bone, involve an area of lymphedema/inflamed/infected tissue? No    Skin repair Complexity:  Complex Final length (cm):  8.1 Informed consent: discussed and consent obtained   Timeout: patient name, date of birth, surgical site, and procedure verified   Procedure prep:  Patient was prepped and draped in usual sterile fashion Prep type:  Chlorhexidine  Anesthesia: the lesion was anesthetized in a standard fashion   Anesthetic:  1% lidocaine  w/ epinephrine  1-100,000 buffered w/ 8.4% NaHCO3 Reason for type of repair: reduce tension to allow closure   Undermining: area extensively undermined   Subcutaneous layers (deep stitches):  Suture size:  3-0 Suture type: PDS (polydioxanone)   Stitches:  Buried vertical mattress Fine/surface layer approximation (top stitches):  Suture type: cyanoacrylate tissue glue   Hemostasis achieved with: suture, pressure and electrodesiccation Outcome: patient tolerated procedure well with no complications   Post-procedure details: sterile dressing applied and wound care instructions given   Dressing type: pressure dressing (Steri Strips)     Return in about 13 days (around 05/06/2024) for Mohs on the left inferior lateral neck.  Tonnie Frederick, Surg Tech III, am acting as scribe for Deneise Finlay, MD.    04/23/2024  HISTORY  OF PRESENT  ILLNESS  Cassidy Barber is seen in consultation at the request of Dr. Steen Eden for biopsy-proven Moderately Differentiated Squamous Cell Carcinoma on the left upper arm. They note that the area has been present for about 4 months increasing in size with time.  There is no history of previous treatment.  Reports no other new or changing lesions and has no other complaints today.  Medications and allergies: see patient chart.  Review of systems: Reviewed 8 systems and notable for the above skin cancer.  All other systems reviewed are unremarkable/negative, unless noted in the HPI. Past medical history, surgical history, family history, social history were also reviewed and are noted in the chart/questionnaire.    PHYSICAL EXAMINATION  General: Well-appearing, in no acute distress, alert and oriented x 4. Vitals reviewed in chart (if available).   Skin: Exam reveals a 1.8 x 2.1 cm erythematous papule and biopsy scar on the left upper arm. There are rhytids, telangiectasias, and lentigines, consistent with photodamage.  Biopsy report(s) reviewed, confirming the diagnosis.   ASSESSMENT  1) Moderately Squamous Cell Carcinoma on the left upper arm 2) photodamage 3) solar lentigines   PLAN   1. Due to location, size, histology, or recurrence and the likelihood of subclinical extension as well as the need to conserve normal surrounding tissue, the patient was deemed acceptable for Mohs micrographic surgery (MMS).  The nature and purpose of the procedure, associated benefits and risks including recurrence and scarring, possible complications such as pain, infection, and bleeding, and alternative methods of treatment if appropriate were discussed with the patient during consent. The lesion location was verified by the patient, by reviewing previous notes, pathology reports, and by photographs as well as angulation measurements if available.  Informed consent was reviewed and signed by the patient, and  timeout was performed at 9:30 AM. See op note below.  2. For the photodamage and solar lentigines, sun protection discussed/information given on OTC sunscreens, and we recommend continued regular follow-up with primary dermatologist every 6 months or sooner for any growing, bleeding, or changing lesions. 3. Prognosis and future surveillance discussed. 4. Letter with treatment outcome sent to referring provider. 5. Pain acetaminophen   MOHS MICROGRAPHIC SURGERY AND RECONSTRUCTION  Initial size:   1.8 x 2.1 cm Surgical defect/wound size: 2.7 x 3.5 cm Anesthesia:    0.33% lidocaine  with 1:200,000 epinephrine  EBL:    <5 mL Complications:  None Repair type:   Complex SQ suture:   3-0 PDS Cutaneous suture:  Dermabond and Steristrips Final size of the repair: 8.1 cm  Stages: 1  STAGE I: Anesthesia achieved with 0.5% lidocaine  with 1:200,000 epinephrine . ChloraPrep applied. 1 section(s) excised using Mohs technique (this includes total peripheral and deep tissue margin excision and evaluation with frozen sections, excised and interpreted by the same physician). The tumor was first debulked and then excised with an approx. 2mm margin.  Hemostasis was achieved with electrocautery as needed.  The specimen was then oriented, subdivided/relaxed, inked, and processed using Mohs technique.    Frozen section analysis revealed a clear deep and peripheral margin.   Reconstruction  The surgical wound was then cleaned, prepped, and re-anesthetized as above. Wound edges were undermined extensively along at least one entire edge and at a distance equal to or greater than the width of the defect (see wound defect size above) in order to achieve closure and decrease wound tension and anatomic distortion. Redundant tissue repair including standing cone removal was performed. Hemostasis was achieved with electrocautery.  Subcutaneous and epidermal tissues were approximated with the above sutures. The surgical site  was then lightly scrubbed with sterile, saline-soaked gauze. Steri-strips were applied, and the area was then bandaged using Vaseline ointment, non-adherent gauze, gauze pads, and tape to provide an adequate pressure dressing. The patient tolerated the procedure well, was given detailed written and verbal wound care instructions, and was discharged in good condition.   The patient will follow-up: 4 weeks.     Documentation: I have reviewed the above documentation for accuracy and completeness, and I agree with the above.  Deneise Finlay, MD

## 2024-04-23 NOTE — Patient Instructions (Signed)

## 2024-04-29 ENCOUNTER — Encounter: Payer: Self-pay | Admitting: Dermatology

## 2024-05-01 ENCOUNTER — Other Ambulatory Visit: Payer: Self-pay | Admitting: Family

## 2024-05-06 ENCOUNTER — Encounter: Admitting: Dermatology

## 2024-05-06 ENCOUNTER — Ambulatory Visit: Admitting: Dermatology

## 2024-05-06 ENCOUNTER — Encounter: Payer: Self-pay | Admitting: Dermatology

## 2024-05-06 VITALS — BP 100/77 | HR 67 | Temp 98.6°F

## 2024-05-06 DIAGNOSIS — L905 Scar conditions and fibrosis of skin: Secondary | ICD-10-CM

## 2024-05-06 DIAGNOSIS — L579 Skin changes due to chronic exposure to nonionizing radiation, unspecified: Secondary | ICD-10-CM

## 2024-05-06 DIAGNOSIS — Z85828 Personal history of other malignant neoplasm of skin: Secondary | ICD-10-CM

## 2024-05-06 DIAGNOSIS — C4492 Squamous cell carcinoma of skin, unspecified: Secondary | ICD-10-CM

## 2024-05-06 DIAGNOSIS — L814 Other melanin hyperpigmentation: Secondary | ICD-10-CM | POA: Diagnosis not present

## 2024-05-06 DIAGNOSIS — C44521 Squamous cell carcinoma of skin of breast: Secondary | ICD-10-CM

## 2024-05-06 NOTE — Progress Notes (Signed)
 Follow-Up Visit   Subjective  Cassidy Barber is a 73 y.o. female who presents for the following: Mohs of Well Differentiated Squamous Cell Carcinoma of the right medial breast, referred by Dr. Livingston.  The following portions of the chart were reviewed this encounter and updated as appropriate: medications, allergies, medical history  Review of Systems:  No other skin or systemic complaints except as noted in HPI or Assessment and Plan.  Objective  Well appearing patient in no apparent distress; mood and affect are within normal limits.  A focused examination was performed of the following areas: Right medial breast Relevant physical exam findings are noted in the Assessment and Plan.   Right medial Breast Thick hyperkeratotic plaque   Assessment & Plan   SQUAMOUS CELL CARCINOMA OF SKIN Right medial Breast Mohs surgery  Consent obtained: written  Anticoagulation: Is the patient taking prescription anticoagulant and/or aspirin  prescribed/recommended by a physician? No   Was the anticoagulation regimen changed prior to Mohs? No    Anesthesia: Anesthesia method: local infiltration Local anesthetic: lidocaine  1% WITH epi  Procedure Details: Timeout: pre-procedure verification complete Procedure Prep: patient was prepped and draped in usual sterile fashion Prep type: chlorhexidine  Biopsy accession number: [id number] Biopsy lab: [facility] Frozen section biopsy performed: No   Specimen debulked: Yes   Pre-Op diagnosis: squamous cell carcinoma SCC subtype: well differentiated MohsAIQ Surgical site (if tumor spans multiple areas, please select predominant area): trunk (excluding nipple/areola) Surgery side: right Surgical site (from skin exam): Right medial Breast Pre-operative length (cm): 1.8 Pre-operative width (cm): 2 Indications for Mohs surgery: anatomic location where tissue conservation is critical and immunocompromised Previously treated? No    Micrographic Surgery  Details: Post-operative length (cm): 3.7 Post-operative width (cm): 3.1 Number of Mohs stages: 1 Cumulative additional sections past 5 per stage: 0 Post surgery depth of defect: subcutaneous fat Is this a complex case (associate members only): No    Stage 1    Tumor features identified on Mohs section: no tumor identified    Depth of tumor invasion after stage: subcutaneous fat    Perineural invasion: no perineural invasion  Patient tolerance of procedure: tolerated well, no immediate complications  Reconstruction: Was the defect reconstructed? Yes   Was reconstruction performed by the same Mohs surgeon? Yes   Setting of reconstruction: outpatient office When was reconstruction performed? same day Type of reconstruction: linear Linear reconstruction: complex Length of linear repair (cm): 8  Opioids: Did the patient receive a prescription for opioid/narcotic related to Mohs surgery?: No    Antibiotics: Does patient meet AHA guidelines for endocarditis?: No   Does patient meet AHA guidelines for orthopedic prophylaxis?: No   Were antibiotics given on the day of surgery?: No   Did surgery breach mucosa, expose cartilage/bone, involve an area of lymphedema/inflamed/infected tissue? No    Skin repair Complexity:  Complex Final length (cm):  8.3 Informed consent: discussed and consent obtained   Timeout: patient name, date of birth, surgical site, and procedure verified   Procedure prep:  Patient was prepped and draped in usual sterile fashion Prep type:  Chlorhexidine  Anesthesia: the lesion was anesthetized in a standard fashion   Anesthetic:  1% lidocaine  w/ epinephrine  1-100,000 buffered w/ 8.4% NaHCO3 Reason for type of repair: reduce tension to allow closure and preserve normal anatomy   Undermining: edges undermined   Subcutaneous layers (deep stitches):  Suture size:  3-0 Suture type: Monocryl (poliglecaprone 25)   Stitches:  Buried vertical mattress Fine/surface  layer approximation (  top stitches):  Suture type: cyanoacrylate tissue glue   Hemostasis achieved with: suture, pressure and electrodesiccation Outcome: patient tolerated procedure well with no complications   Post-procedure details: sterile dressing applied and wound care instructions given   Dressing type: bandage and pressure dressing    Scar s/p Mohs for a Moderately Differentiated Squamous Cell Carcinoma on the left arm, treated on 04/23/2024, repaired with linear closure - Reassured that wound has healed well - Discussed that scars take up to 12 months to mature from the date of surgery - Recommend SPF 30+ to scar daily to prevent purple color - OK to start scar massage at 4-6 weeks post-op - Can consider silicone based products for scar healing  HISTORY OF SQUAMOUS CELL CARCINOMA OF THE SKIN - No evidence of recurrence today - Recommend regular full body skin exams - Recommend daily broad spectrum sunscreen SPF 30+ to sun-exposed areas, reapply every 2 hours as needed.  - Call if any new or changing lesions are noted between office visits   Return in about 4 weeks (around 06/03/2024) for mohs follow up.  Cassidy Barber, Surg Tech III, am acting as scribe for RUFUS CHRISTELLA HOLY, MD.    05/06/2024  HISTORY OF PRESENT ILLNESS  Cassidy Barber is seen in consultation at the request of Dr. Livingston for biopsy-proven Well Differentiated Squamous Cell Carcinoma of the right breast. They note that the area has been present for about 4 months increasing in size with time.  There is no history of previous treatment.  Reports no other new or changing lesions and has no other complaints today.  Medications and allergies: see patient chart.  Review of systems: Reviewed 8 systems and notable for the above skin cancer.  All other systems reviewed are unremarkable/negative, unless noted in the HPI. Past medical history, surgical history, family history, social history were also reviewed and are noted  in the chart/questionnaire.    PHYSICAL EXAMINATION  General: Well-appearing, in no acute distress, alert and oriented x 4. Vitals reviewed in chart (if available).   Skin: Exam reveals a 1.8 x 2.0 cm erythematous papule and biopsy scar on the right breast. There are rhytids, telangiectasias, and lentigines, consistent with photodamage.   Biopsy report(s) reviewed, confirming the diagnosis.   ASSESSMENT  1) Well Differentiated Squamous Cell Carcinoma of the right breast 2) photodamage 3) solar lentigines   PLAN   1. Due to location, size, histology, or recurrence and the likelihood of subclinical extension as well as the need to conserve normal surrounding tissue, the patient was deemed acceptable for Mohs micrographic surgery (MMS).  The nature and purpose of the procedure, associated benefits and risks including recurrence and scarring, possible complications such as pain, infection, and bleeding, and alternative methods of treatment if appropriate were discussed with the patient during consent. The lesion location was verified by the patient, by reviewing previous notes, pathology reports, and by photographs as well as angulation measurements if available.  Informed consent was reviewed and signed by the patient, and timeout was performed at 9:00 AM. See op note below.  2. For the photodamage and solar lentigines, sun protection discussed/information given on OTC sunscreens, and we recommend continued regular follow-up with primary dermatologist every 6 months or sooner for any growing, bleeding, or changing lesions. 3. Prognosis and future surveillance discussed. 4. Letter with treatment outcome sent to referring provider. 5. Pain ibuprofen   MOHS MICROGRAPHIC SURGERY AND RECONSTRUCTION  Initial size:   1.8 x 2.0 cm Surgical  defect/wound size: 3.7 x 3.1 cm Anesthesia:    0.33% lidocaine  with 1:200,000 epinephrine  EBL:    <5 mL Complications:  None Repair type:   Complex SQ  suture:   3-0 Monocryl Cutaneous suture:  Cyanoacrylate and Steristrips Final size of the repair: 8.2 cm  Stages: 1  STAGE I: Anesthesia achieved with 0.5% lidocaine  with 1:200,000 epinephrine . ChloraPrep applied. 1 section(s) excised using Mohs technique (this includes total peripheral and deep tissue margin excision and evaluation with frozen sections, excised and interpreted by the same physician). The tumor was first debulked and then excised with an approx. 2mm margin.  Hemostasis was achieved with electrocautery as needed.  The specimen was then oriented, subdivided/relaxed, inked, and processed using Mohs technique.    Frozen section analysis revealed a clear deep and peripheral margin.   Reconstruction  The surgical wound was then cleaned, prepped, and re-anesthetized as above. Wound edges were undermined extensively along at least one entire edge and at a distance equal to or greater than the width of the defect (see wound defect size above) in order to achieve closure and decrease wound tension and anatomic distortion. Redundant tissue repair including standing cone removal was performed. Hemostasis was achieved with electrocautery. Subcutaneous and epidermal tissues were approximated with the above sutures. The surgical site was then lightly scrubbed with sterile, saline-soaked gauze. Steri-strips were applied, and the area was then bandaged using Vaseline ointment, non-adherent gauze, gauze pads, and tape to provide an adequate pressure dressing. The patient tolerated the procedure well, was given detailed written and verbal wound care instructions, and was discharged in good condition.   The patient will follow-up: 4 weeks.    Documentation: I have reviewed the above documentation for accuracy and completeness, and I agree with the above.  RUFUS CHRISTELLA HOLY, MD

## 2024-05-06 NOTE — Patient Instructions (Signed)

## 2024-05-08 ENCOUNTER — Ambulatory Visit (INDEPENDENT_AMBULATORY_CARE_PROVIDER_SITE_OTHER): Admitting: Family

## 2024-05-08 VITALS — BP 121/80 | HR 73 | Temp 98.0°F | Ht 64.0 in | Wt 149.0 lb

## 2024-05-08 DIAGNOSIS — K31819 Angiodysplasia of stomach and duodenum without bleeding: Secondary | ICD-10-CM

## 2024-05-08 DIAGNOSIS — F411 Generalized anxiety disorder: Secondary | ICD-10-CM

## 2024-05-08 DIAGNOSIS — Z79899 Other long term (current) drug therapy: Secondary | ICD-10-CM | POA: Diagnosis not present

## 2024-05-08 DIAGNOSIS — D692 Other nonthrombocytopenic purpura: Secondary | ICD-10-CM

## 2024-05-08 DIAGNOSIS — Z944 Liver transplant status: Secondary | ICD-10-CM

## 2024-05-08 DIAGNOSIS — Z0001 Encounter for general adult medical examination with abnormal findings: Secondary | ICD-10-CM

## 2024-05-08 DIAGNOSIS — D849 Immunodeficiency, unspecified: Secondary | ICD-10-CM

## 2024-05-08 DIAGNOSIS — F1320 Sedative, hypnotic or anxiolytic dependence, uncomplicated: Secondary | ICD-10-CM | POA: Diagnosis not present

## 2024-05-08 DIAGNOSIS — K529 Noninfective gastroenteritis and colitis, unspecified: Secondary | ICD-10-CM

## 2024-05-08 DIAGNOSIS — E039 Hypothyroidism, unspecified: Secondary | ICD-10-CM

## 2024-05-08 DIAGNOSIS — D509 Iron deficiency anemia, unspecified: Secondary | ICD-10-CM

## 2024-05-08 DIAGNOSIS — Z1509 Genetic susceptibility to other malignant neoplasm: Secondary | ICD-10-CM

## 2024-05-08 DIAGNOSIS — K219 Gastro-esophageal reflux disease without esophagitis: Secondary | ICD-10-CM

## 2024-05-08 DIAGNOSIS — M5432 Sciatica, left side: Secondary | ICD-10-CM

## 2024-05-08 DIAGNOSIS — G4700 Insomnia, unspecified: Secondary | ICD-10-CM

## 2024-05-08 DIAGNOSIS — Z0000 Encounter for general adult medical examination without abnormal findings: Secondary | ICD-10-CM

## 2024-05-08 DIAGNOSIS — B027 Disseminated zoster: Secondary | ICD-10-CM

## 2024-05-08 DIAGNOSIS — I10 Essential (primary) hypertension: Secondary | ICD-10-CM

## 2024-05-08 MED ORDER — ALPRAZOLAM 0.5 MG PO TABS: 0.5000 mg | ORAL_TABLET | Freq: Every day | ORAL | 1 refills | Status: DC

## 2024-05-08 MED ORDER — GABAPENTIN 100 MG PO CAPS: 100.0000 mg | ORAL_CAPSULE | Freq: Three times a day (TID) | ORAL | 3 refills | Status: DC | PRN

## 2024-05-08 MED ORDER — HYDROXYZINE PAMOATE 25 MG PO CAPS: 25.0000 mg | ORAL_CAPSULE | Freq: Three times a day (TID) | ORAL | 2 refills | Status: DC | PRN

## 2024-05-08 NOTE — Progress Notes (Addendum)
 Subjective:    Patient ID: Cassidy Barber, female    DOB: 04-25-1951, 73 y.o.   MRN: [id number]  Chief Complaint  Patient presents with   Medical Management of Chronic Issues   PT presents to the office today for CPE and chronic follow up.    She is followed by [facility] for hx of liver transplant. She had an incarcerated ventral hernia with fluid in the hernia sac 04/05/18. She has been diagnosed with GAVE.    She is followed by Hemologists every 3 months for iron  deficiency anemia.      She had a left reverse shoulder 07/17/18 and then had to redone in 09/27/19. She is scheduled for reverse shoulder arthroplasty on 07/20/21 on her right shoulder.    She currently has skin cancer on her scalp and is completed 30 radiation treatments then had 20 radiation treatments on another.  She is followed by dermatologists for recurrent squamous skin cancer on legs, arms, and face. Has purpura senilis. Stable.    She has Lynch syndrome.  She has had shingles since January 13 2024. Was hospitalized and has continued valtrex  and completed steroids.  Hypertension This is a chronic problem. The current episode started more than 1 year ago. The problem has been resolved since onset. The problem is controlled. Associated symptoms include anxiety and malaise/fatigue. Pertinent negatives include no peripheral edema (every now and then) or shortness of breath. Risk factors for coronary artery disease include dyslipidemia and sedentary lifestyle. The current treatment provides moderate improvement. Identifiable causes of hypertension include a thyroid  problem.  Gastroesophageal Reflux She complains of belching, heartburn and a hoarse voice. This is a chronic problem. The current episode started more than 1 year ago. The problem occurs occasionally. The symptoms are aggravated by certain foods. Associated symptoms include fatigue. Risk factors include obesity. She has tried a PPI for the symptoms. The treatment provided  moderate relief.  Thyroid  Problem Presents for follow-up visit. Symptoms include anxiety, diarrhea, dry skin, fatigue and hoarse voice. Patient reports no constipation. The symptoms have been stable.  Anemia Presents for follow-up visit. Symptoms include malaise/fatigue.  Insomnia Primary symptoms: difficulty falling asleep, malaise/fatigue.   The current episode started more than one year. The onset quality is gradual. The problem occurs intermittently. Past treatments include meditation. The treatment provided moderate relief.  Anxiety Presents for follow-up visit. Symptoms include excessive worry, insomnia, nervous/anxious behavior and restlessness. Patient reports no shortness of breath. Symptoms occur occasionally. The severity of symptoms is mild.        Review of Systems  Constitutional:  Positive for fatigue and malaise/fatigue.  HENT:  Positive for hoarse voice.   Respiratory:  Negative for shortness of breath.   Gastrointestinal:  Positive for diarrhea and heartburn. Negative for constipation.  Psychiatric/Behavioral:  The patient is nervous/anxious and has insomnia.   All other systems reviewed and are negative.  Family History  Problem Relation Age of Onset   Prostate cancer Father    Colon cancer Father    Colon cancer Sister    Lung cancer Sister    Healthy Son    Alcohol abuse Brother    Allergic rhinitis Neg Hx    Asthma Neg Hx    Eczema Neg Hx    Urticaria Neg Hx    Social History   Socioeconomic History   Marital status: Married    Spouse name: Johnny   Number of children: 1   Years of education: 12   Highest education level:  12th grade  Occupational History   Occupation: Disability    Employer: [employer]    Comment: Immunologist  Tobacco Use   Smoking status: Never   Smokeless tobacco: Never  Vaping Use   Vaping status: Never Used  Substance and Sexual Activity   Alcohol use: No    Alcohol/week: 0.0 standard drinks of alcohol    Drug use: No   Sexual activity: Yes    Birth control/protection: None  Other Topics Concern   Not on file  Social History Narrative   Patient lives in a two story home with her husband. She has an adult son. She is retired from being an Environmental health practitioner for 30 years.    Social Drivers of Corporate investment banker Strain: Low Risk  (05/08/2024)   Overall Financial Resource Strain (CARDIA)    Difficulty of Paying Living Expenses: Not hard at all  Food Insecurity: No Food Insecurity (05/08/2024)   Hunger Vital Sign    Worried About Running Out of Food in the Last Year: Never true    Ran Out of Food in the Last Year: Never true  Transportation Needs: No Transportation Needs (05/08/2024)   PRAPARE - Administrator, Civil Service (Medical): No    Lack of Transportation (Non-Medical): No  Physical Activity: Insufficiently Active (05/08/2024)   Exercise Vital Sign    Days of Exercise per Week: 1 day    Minutes of Exercise per Session: 30 min  Stress: No Stress Concern Present (05/08/2024)   [employer] of Occupational Health - Occupational Stress Questionnaire    Feeling of Stress: Only a little  Social Connections: Socially Integrated (05/08/2024)   Social Connection and Isolation Panel    Frequency of Communication with Friends and Family: Once a week    Frequency of Social Gatherings with Friends and Family: More than three times a week    Attends Religious Services: More than 4 times per year    Active Member of [employer] or Organizations: Yes    Attends Banker Meetings: 1 to 4 times per year    Marital Status: Married        Objective:   Physical Exam Vitals reviewed.  Constitutional:      General: She is not in acute distress.    Appearance: She is well-developed.  HENT:     Head: Normocephalic and atraumatic.   Eyes:     Pupils: Pupils are equal, round, and reactive to light.   Neck:     Thyroid : No thyromegaly.   Cardiovascular:      Rate and Rhythm: Normal rate and regular rhythm.     Heart sounds: Normal heart sounds. No murmur heard. Pulmonary:     Effort: Pulmonary effort is normal. No respiratory distress.     Breath sounds: Normal breath sounds. No wheezing.  Abdominal:     General: Bowel sounds are normal. There is no distension.     Palpations: Abdomen is soft.     Tenderness: There is no abdominal tenderness.   Musculoskeletal:        General: No tenderness. Normal range of motion.     Cervical back: Normal range of motion and neck supple.   Skin:      Neurological:     Mental Status: She is alert and oriented to person, place, and time.     Cranial Nerves: No cranial nerve deficit.     Deep Tendon Reflexes: Reflexes are normal and symmetric.  Psychiatric:        Behavior: Behavior normal.        Thought Content: Thought content normal.        Judgment: Judgment normal.       BP 121/80   Pulse 73   Temp 98 F (36.7 C) (Temporal)   Ht 5' 4 (1.626 m)   Wt 149 lb (67.6 kg)   SpO2 96%   BMI 25.58 kg/m      Assessment & Plan:  LAKYIA BEHE comes in today with chief complaint of Medical Management of Chronic Issues   Diagnosis and orders addressed:  1. GAD (generalized anxiety disorder) - ALPRAZolam  (XANAX ) 0.5 MG tablet; Take 1 tablet (0.5 mg total) by mouth at bedtime.  Dispense: 90 tablet; Refill: 1  2. Benzodiazepine dependence (HCC) - ALPRAZolam  (XANAX ) 0.5 MG tablet; Take 1 tablet (0.5 mg total) by mouth at bedtime.  Dispense: 90 tablet; Refill: 1  3. Controlled substance agreement signed - ALPRAZolam  (XANAX ) 0.5 MG tablet; Take 1 tablet (0.5 mg total) by mouth at bedtime.  Dispense: 90 tablet; Refill: 1  4. Hypothyroidism, unspecified type (Primary)  5. Hx of liver transplant (HCC)  6. Insomnia, unspecified type  7. Chronic diarrhea  8. Iron  deficiency anemia, unspecified iron  deficiency anemia type   9. Lynch syndrome  10. Gastroesophageal reflux disease,  unspecified whether esophagitis present  11. Immunosuppression (HCC)  12. Primary hypertension  13. GAVE (gastric antral vascular ectasia)   14. Purpura senilis (HCC)  15. Disseminated herpes zoster  - hydrOXYzine  (VISTARIL ) 25 MG capsule; Take 1 capsule (25 mg total) by mouth every 8 (eight) hours as needed.  Dispense: 90 capsule; Refill: 2  16. Left sciatic nerve pain - gabapentin  (NEURONTIN ) 100 MG capsule; Take 1 capsule (100 mg total) by mouth 3 (three) times daily as needed.  Dispense: 90 capsule; Refill: 3  17. Annual physical exam (Primary)   Labs reviewed from specialist  Continue Current medications  Patient reviewed in [place] controlled database, no flags noted. Contract and drug screen up dated today.  Keep follow up with specialists  Health Maintenance reviewed Diet and exercise encouraged  Follow up plan: 6 months    Bari Learn, FNP

## 2024-05-08 NOTE — Patient Instructions (Signed)
 Shingles  Shingles, or herpes zoster, is an infection. It gives you a skin rash and blisters. These infected areas may hurt a lot. Shingles only happens if: You've had chickenpox. You've been given a shot called a vaccine to protect you from getting chickenpox. Shingles is rare in this case. What are the causes? Shingles is caused by a germ called the varicella-zoster virus. This is the same germ that causes chickenpox. After you're exposed to the germ, it stays in your body but is dormant. This means it isn't active. Shingles happens if the germ becomes active again. This can happen years after you're first exposed to the germ. What increases the risk? You may be more likely to get shingles if: You're older than 73 years of age. You're under a lot of stress. You have a weak immune system. The immune system is your body's defense system. It may be weak if: You have human immunodeficiency virus (HIV). You have acquired immunodeficiency syndrome (AIDS). You have cancer. You take medicines that weaken your immune system. These include organ transplant medicines. What are the signs or symptoms? The first symptoms of shingles may be itching, tingling, or pain. Your skin may feel like it's burning. A few days or weeks later, you'll get a rash. Here's what you can expect: The rash is likely to be on one side of your body. The rash may be shaped like a belt or a band. Over time, it will turn into blisters filled with fluid. The blisters will break open and change into scabs. The scabs will dry up in about 2-3 weeks. You may also have: A fever. Chills. A headache. Nausea. How is this diagnosed? Shingles is diagnosed with a skin exam. A sample called a culture may be taken from one of your blisters and sent to a lab. This will show if you have shingles. How is this treated? The rash may last for several weeks. There's no cure for shingles, but your health care provider may give you medicines.  These medicines may: Help with pain. Help with itching. Help with irritation and swelling. Help you get better sooner. Help to prevent long-term problems. If the rash is on your face, you may need to see an eye doctor or an ear, nose, and throat (ENT) doctor. Follow these instructions at home: Medicines Take your medicines only as told by your provider. Put an anti-itch cream or numbing cream on the rash or blisters as told by your provider. Relieving itching and discomfort  To help with itching: Put cold, wet cloths called cold compresses on the rash or blisters. Take a cool bath. Try adding baking soda or dry oatmeal to the water. Do not bathe in hot water. Use calamine lotion on the rash or blisters. You can get this type of lotion at the store. Blister and rash care Keep your rash covered with a loose bandage. Wear loose clothes that don't rub on your rash. Take care of your rash as told by your provider. Make sure you: Wash your hands with soap and water for at least 20 seconds before and after you change your bandage. If you can't use soap and water, use hand sanitizer. Keep your rash and blisters clean by washing them with mild soap and cool water. Change your bandage. Check your rash every day for signs of infection. Check for: More redness, swelling, or pain. Fluid or blood. Warmth. Pus or a bad smell. Do not scratch your rash. Do not pick at your  blisters. To help you not scratch: Keep your fingernails clean and cut short. Try to wear gloves or mittens when you sleep. General instructions Rest. Wash your hands often with soap and water for at least 20 seconds. If you can't use soap and water, use hand sanitizer. Washing your hands lowers your chance of getting a skin infection. Your infection can cause chickenpox in others. If you have blisters that aren't scabs yet, stay away from: Babies. Pregnant people. Children who have eczema. Older people who have organ  transplants. People who have a long-term, or chronic, illness. Anyone who hasn't had chickenpox before. Anyone who hasn't gotten the chickenpox vaccine. How is this prevented? Vaccines are the best way to prevent you from getting chickenpox or shingles. Talk with your provider about getting these shots. Where to find more information Centers for Disease Control and Prevention (CDC): [employer].no Contact a health care provider if: Your pain doesn't get better with medicine. Your pain doesn't get better after the rash heals. You have any signs of infection around the rash. Your rash or blisters get worse. You have a fever or chills. Get help right away if: The rash is on your face or nose. You have pain in your face or by your eye. You lose feeling on one side of your face. You have trouble seeing. You have ear pain or ringing in your ear. This information is not intended to replace advice given to you by your health care provider. Make sure you discuss any questions you have with your health care provider. Document Revised: 08/01/2023 Document Reviewed: 12/14/2022 Elsevier Patient Education  2024 [employer].

## 2024-05-08 NOTE — Addendum Note (Signed)
 Addended by: LAVELL LYE A on: 05/08/2024 12:08 PM   Modules accepted: Level of Service

## 2024-05-11 ENCOUNTER — Inpatient Hospital Stay: Payer: Medicare Other

## 2024-05-12 ENCOUNTER — Other Ambulatory Visit (HOSPITAL_COMMUNITY)
Admission: RE | Admit: 2024-05-12 | Discharge: 2024-05-12 | Disposition: A | Discharge status: Home / Self Care | Source: Ambulatory Visit | Attending: Transplant Hepatology | Admitting: Transplant Hepatology

## 2024-05-12 ENCOUNTER — Inpatient Hospital Stay: Attending: Hematology

## 2024-05-12 ENCOUNTER — Other Ambulatory Visit (INDEPENDENT_AMBULATORY_CARE_PROVIDER_SITE_OTHER): Payer: Self-pay | Admitting: Gastroenterology

## 2024-05-12 DIAGNOSIS — E538 Deficiency of other specified B group vitamins: Secondary | ICD-10-CM | POA: Insufficient documentation

## 2024-05-12 DIAGNOSIS — D509 Iron deficiency anemia, unspecified: Secondary | ICD-10-CM | POA: Insufficient documentation

## 2024-05-12 DIAGNOSIS — D500 Iron deficiency anemia secondary to blood loss (chronic): Secondary | ICD-10-CM

## 2024-05-12 DIAGNOSIS — D849 Immunodeficiency, unspecified: Secondary | ICD-10-CM | POA: Insufficient documentation

## 2024-05-12 DIAGNOSIS — Z944 Liver transplant status: Secondary | ICD-10-CM | POA: Insufficient documentation

## 2024-05-12 DIAGNOSIS — K743 Primary biliary cirrhosis: Secondary | ICD-10-CM

## 2024-05-12 DIAGNOSIS — D638 Anemia in other chronic diseases classified elsewhere: Secondary | ICD-10-CM

## 2024-05-12 LAB — IRON AND TIBC
Iron: 66 ug/dL (ref 28–170)
Saturation Ratios: 23 % (ref 10.4–31.8)
TIBC: 283 ug/dL (ref 250–450)
UIBC: 217 ug/dL

## 2024-05-12 LAB — CBC WITH DIFFERENTIAL/PLATELET
Abs Immature Granulocytes: 0.02 10*3/uL (ref 0.00–0.07)
Basophils Absolute: 0.1 10*3/uL (ref 0.0–0.1)
Basophils Relative: 2 %
Eosinophils Absolute: 0.4 10*3/uL (ref 0.0–0.5)
Eosinophils Relative: 5 %
HCT: 43.3 % (ref 36.0–46.0)
Hemoglobin: 14 g/dL (ref 12.0–15.0)
Immature Granulocytes: 0 %
Lymphocytes Relative: 44 %
Lymphs Abs: 3.1 10*3/uL (ref 0.7–4.0)
MCH: 32.2 pg (ref 26.0–34.0)
MCHC: 32.3 g/dL (ref 30.0–36.0)
MCV: 99.5 fL (ref 80.0–100.0)
Monocytes Absolute: 1.1 10*3/uL — ABNORMAL HIGH (ref 0.1–1.0)
Monocytes Relative: 15 %
Neutro Abs: 2.4 10*3/uL (ref 1.7–7.7)
Neutrophils Relative %: 34 %
Platelets: 323 10*3/uL (ref 150–400)
RBC: 4.35 MIL/uL (ref 3.87–5.11)
RDW: 14 % (ref 11.5–15.5)
WBC: 7.1 10*3/uL (ref 4.0–10.5)
nRBC: 0 % (ref 0.0–0.2)

## 2024-05-12 LAB — VITAMIN B12: Vitamin B-12: 199 pg/mL (ref 180–914)

## 2024-05-12 LAB — FERRITIN: Ferritin: 129 ng/mL (ref 11–307)

## 2024-05-12 NOTE — Progress Notes (Unsigned)
 VIRTUAL VISIT via TELEPHONE NOTE [facility]   I connected with Cassidy Barber  on 05/13/24 at  8:22 AM by telephone and verified that I am speaking with the correct person using two identifiers.  Location: Patient: Home Provider: [facility]   I discussed the limitations, risks, security and privacy concerns of performing an evaluation and management service by telephone and the availability of in person appointments. I also discussed with the patient that there may be a patient responsible charge related to this service. The patient expressed understanding and agreed to proceed.    CURRENT THERAPY: Intermittent IV iron   INTERVAL HISTORY:   Cassidy Barber 73 y.o. female returns for routine follow-up of iron  deficiency anemia.  She was last seen by Cassidy Barefoot PA-C on 11/19/2023. She was hospitalized at [facility] in March 2025 for treatment of shingles (in transplant patient). Most recent IV iron  with Venofer  300 mg x 3 in February 2025, and reported feeling unchanged after IV iron . Underwent EGD and colonoscopy on 03/17/2024, which showed normal stomach and duodenum; normal ileum and patent end-to-side ileocolonic anastomosis characterized by congestion with distal rectum and anal verge normal. She reports baseline fatigue, with energy that comes and goes. She continues to have intermittent loose, dark bowel movements which have been chronic ever since her colectomy.  No gross hematochezia, epistaxis, or other source of blood loss. No pica, restless legs, chest pain, dyspnea, lightheadedness, or syncope.   She continues to have frequent Mohs surgery for removal of squamous cell skin cancer related to immunosupprssion from transplant. She reports 75% energy and 100% appetite. She is maintaining a stable weight at this time.  ASSESSMENT & PLAN:  1.  Iron  deficiency anemia from malabsorption and chronic GI blood loss:  - Unable to tolerate oral iron . - Secondary to  history of colon cancer s/p abdominal colectomy, malabsorption, and chronic blood loss from GAVE - Most recent EGD and colonoscopy on 03/17/2024, which showed normal stomach and duodenum; normal ileum and patent end-to-side ileocolonic anastomosis characterized by congestion with distal rectum and anal verge normal. - SPEP negative in 2019 - She last received IV iron  (Venofer  300 mg x 3) in 20 2025 - Reports intermittent fatigue - Dark stools ever since her colectomy.  Denies any epistaxis or frank rectal hemorrhage. - Labs (05/12/2024): Hgb 14.0/MCV 99.5, ferritin 129, iron  saturation 23% - PLAN: No IV iron  at this time. - Labs in 6 months, phone visit 1 week after  - Continue follow-up with GI (Dr. Eartha) for management of any GI blood loss   2.  Vitamin B12 deficiency  - She was previously taking Vitamin B12 tablet (2000 mcg) once daily (until February 2024) - Labs from 01/10/2023 showed elevated B12 at 1563, therefore OTC supplement was stopped by her PCP. - Most recent labs (05/12/2024): Vitamin B12 199, MMA  pending - PLAN: Recommend restarting vitamin B12 1000 mcg daily.   Will recheck B12/MMA in 6 months.   3.  Liver Transplant: - Had this completed at [facility] in January 2015.  - She is on immunosuppression with tacrolimus  (previously on sirolimus , but switched to tacrolimus  due to frequent skin surgeries)   4.  Skin cancer on her scalp: - Secondary to chronic immunosuppression for liver transplant; Lynch Syndrome - She is status post 30 treatments of radiation to her scalp. - She is followed by dermatology and has had several areas treated recently with topical creams. - Plastic surgery for excision of scalp  wound on 05/13/2023 - Continues to follow closely with dermatology/Mohs specialist for squamous cell carcinoma   5.  History of colon cancer with [facility] SYNDROME: - Status post abdominal colectomy on 09/17/2011 with simultaneous total abdominal hysterectomy and bilateral  salpingo-oophorectomy. - Found to have Lynch syndrome. - Per recommendations she is to have an EGD every 2 to 3 years and annual colonoscopy for review of her residual colon tissue at the anastomosis. - Sigmoidoscopy (02/15/2021): Two nonbleeding erosions at ileocolonic anastomosis, small external hemorrhoids   PLAN SUMMARY: >> Labs in 6 months = CBC/D, CMP, ferritin, iron /TIBC, B12, MMA >> OFFICE visit in 6 months (1 week after labs)    ** Last office visit on 11/19/2023     REVIEW OF SYSTEMS:   Review of Systems  Constitutional:  Positive for malaise/fatigue. Negative for chills, diaphoresis, fever and weight loss.  Respiratory:  Negative for cough and shortness of breath.   Cardiovascular:  Negative for chest pain and palpitations.  Gastrointestinal:  Negative for abdominal pain, blood in stool, melena, nausea and vomiting.  Neurological:  Negative for dizziness and headaches.     PHYSICAL EXAM: (per limitations of virtual telephone visit)  The patient is alert and oriented x 3, exhibiting adequate mentation, good mood, and ability to speak in full sentences and execute sound judgement.  WRAP UP:   I discussed the assessment and treatment plan with the patient. The patient was provided an opportunity to ask questions and all were answered. The patient agreed with the plan and demonstrated an understanding of the instructions.   The patient was advised to call back or seek an in-person evaluation if the symptoms worsen or if the condition fails to improve as anticipated.  I provided 22 minutes of non-face-to-face time during this encounter, including >10 minutes of medical discussion.  Cassidy CHRISTELLA Barefoot, PA-C 05/13/24 8:47 AM

## 2024-05-13 ENCOUNTER — Encounter: Payer: Self-pay | Admitting: Dermatology

## 2024-05-13 ENCOUNTER — Inpatient Hospital Stay (HOSPITAL_BASED_OUTPATIENT_CLINIC_OR_DEPARTMENT_OTHER): Payer: Medicare Other | Admitting: Physician Assistant

## 2024-05-13 DIAGNOSIS — D638 Anemia in other chronic diseases classified elsewhere: Secondary | ICD-10-CM

## 2024-05-13 DIAGNOSIS — K909 Intestinal malabsorption, unspecified: Secondary | ICD-10-CM | POA: Diagnosis not present

## 2024-05-13 DIAGNOSIS — K922 Gastrointestinal hemorrhage, unspecified: Secondary | ICD-10-CM | POA: Diagnosis not present

## 2024-05-13 DIAGNOSIS — E538 Deficiency of other specified B group vitamins: Secondary | ICD-10-CM | POA: Diagnosis not present

## 2024-05-13 DIAGNOSIS — Z944 Liver transplant status: Secondary | ICD-10-CM

## 2024-05-13 DIAGNOSIS — C4440 Unspecified malignant neoplasm of skin of scalp and neck: Secondary | ICD-10-CM

## 2024-05-13 DIAGNOSIS — Z1509 Genetic susceptibility to other malignant neoplasm: Secondary | ICD-10-CM

## 2024-05-13 DIAGNOSIS — D500 Iron deficiency anemia secondary to blood loss (chronic): Secondary | ICD-10-CM | POA: Diagnosis not present

## 2024-05-13 DIAGNOSIS — Z85038 Personal history of other malignant neoplasm of large intestine: Secondary | ICD-10-CM

## 2024-05-13 DIAGNOSIS — D849 Immunodeficiency, unspecified: Secondary | ICD-10-CM

## 2024-05-13 MED ORDER — VITAMIN B-12 1000 MCG PO TABS: 1000.0000 ug | ORAL_TABLET | Freq: Every day | ORAL | 3 refills | Status: AC

## 2024-05-14 LAB — SIROLIMUS LEVEL: Sirolimus (Rapamycin): 3.1 ng/mL (ref 3.0–20.0)

## 2024-05-17 LAB — METHYLMALONIC ACID, SERUM: Methylmalonic Acid, Quantitative: 213 nmol/L (ref 0–378)

## 2024-05-18 ENCOUNTER — Inpatient Hospital Stay: Payer: Medicare Other | Admitting: Physician Assistant

## 2024-05-18 ENCOUNTER — Other Ambulatory Visit: Payer: Self-pay | Admitting: Family

## 2024-05-18 DIAGNOSIS — D849 Immunodeficiency, unspecified: Secondary | ICD-10-CM | POA: Diagnosis not present

## 2024-05-18 DIAGNOSIS — Z944 Liver transplant status: Secondary | ICD-10-CM | POA: Diagnosis not present

## 2024-05-18 NOTE — Telephone Encounter (Signed)
 Last OV 05/08/24. Last RF 02/03/24. Next OV patient to follow up in 6 months

## 2024-05-21 ENCOUNTER — Institutional Professional Consult (permissible substitution) (INDEPENDENT_AMBULATORY_CARE_PROVIDER_SITE_OTHER): Admitting: Otolaryngology

## 2024-05-21 ENCOUNTER — Ambulatory Visit (INDEPENDENT_AMBULATORY_CARE_PROVIDER_SITE_OTHER): Admitting: Audiology

## 2024-05-26 DIAGNOSIS — D485 Neoplasm of uncertain behavior of skin: Secondary | ICD-10-CM | POA: Diagnosis not present

## 2024-05-26 DIAGNOSIS — D044 Carcinoma in situ of skin of scalp and neck: Secondary | ICD-10-CM | POA: Diagnosis not present

## 2024-05-26 DIAGNOSIS — L570 Actinic keratosis: Secondary | ICD-10-CM | POA: Diagnosis not present

## 2024-05-26 DIAGNOSIS — C4442 Squamous cell carcinoma of skin of scalp and neck: Secondary | ICD-10-CM | POA: Diagnosis not present

## 2024-05-26 DIAGNOSIS — S41102A Unspecified open wound of left upper arm, initial encounter: Secondary | ICD-10-CM | POA: Diagnosis not present

## 2024-06-04 ENCOUNTER — Ambulatory Visit: Admitting: Dermatology

## 2024-06-04 ENCOUNTER — Encounter: Payer: Self-pay | Admitting: Dermatology

## 2024-06-04 VITALS — BP 112/72 | HR 84

## 2024-06-04 DIAGNOSIS — L905 Scar conditions and fibrosis of skin: Secondary | ICD-10-CM

## 2024-06-04 DIAGNOSIS — Z85828 Personal history of other malignant neoplasm of skin: Secondary | ICD-10-CM | POA: Diagnosis not present

## 2024-06-04 DIAGNOSIS — C4492 Squamous cell carcinoma of skin, unspecified: Secondary | ICD-10-CM

## 2024-06-04 DIAGNOSIS — T8131XA Disruption of external operation (surgical) wound, not elsewhere classified, initial encounter: Secondary | ICD-10-CM | POA: Diagnosis not present

## 2024-06-04 DIAGNOSIS — S21001A Unspecified open wound of right breast, initial encounter: Secondary | ICD-10-CM

## 2024-06-04 DIAGNOSIS — T1490XD Injury, unspecified, subsequent encounter: Secondary | ICD-10-CM

## 2024-06-04 NOTE — Progress Notes (Unsigned)
   Follow Up Visit   Subjective  Cassidy Barber is a 73 y.o. female who presents for the following: follow up from Mohs surgery   The patient presents for follow up from Mohs surgery for a SCC on the right medial breast, treated on 05/06/24, repaired with linear repair. The patient has been bandaging the wound as directed. The endorse the following concerns: wound on arm opened up.   The following portions of the chart were reviewed this encounter and updated as appropriate: medications, allergies, medical history  Review of Systems:  No other skin or systemic complaints except as noted in HPI or Assessment and Plan.  Objective  Well appearing patient in no apparent distress; mood and affect are within normal limits.  A full examination was performed including scalp, head, face and right breast.  All findings within normal limits unless otherwise noted below.  Healing wound with mild erythema  Relevant physical exam findings are noted in the Assessment and Plan.       Assessment & Plan    Healing s/p Mohs for SCC, treated on right breast, repaired with linear closure. - Reassured that wound is healing well - No evidence of infection - No swelling, induration, purulence, dehiscence, or tenderness out of proportion to the clinical exam, see photo above - Discussed that scars take up to 12 months to mature from the date of surgery - Recommend SPF 30+ to scar daily to prevent purple color from UV exposure during scar maturation process - Discussed that erythema and raised appearance of scar will fade over the next 4-6 months - OK to start scar massage at 4-6 weeks post-op - Can consider silicone based products for scar healing starting at 6 weeks post-op - Ok to discontinue ointment daily to wound.  Scar with mild suture reaction and dehiscence s/p Mohs for a Moderately Differentiated Squamous Cell Carcinoma on the left arm, treated on 04/23/2024, repaired with linear closure -  Reassured that wound has healed well - Discussed that scars take up to 12 months to mature from the date of surgery - Recommend SPF 30+ to scar daily to prevent purple color - OK to start scar massage at 4-6 weeks post-op - Can consider silicone based products for scar healing -Continue ointment and bandage to the area till heals   HISTORY OF SQUAMOUS CELL CARCINOMA OF THE SKIN - No evidence of recurrence today - Recommend regular full body skin exams - Recommend daily broad spectrum sunscreen SPF 30+ to sun-exposed areas, reapply every 2 hours as needed.  - Call if any new or changing lesions are noted between office visits    Return if symptoms worsen or fail to improve.  I, Berwyn Lesches, Surg Tech III, am acting as scribe for RUFUS CHRISTELLA HOLY, MD.   Documentation: I have reviewed the above documentation for accuracy and completeness, and I agree with the above.  RUFUS CHRISTELLA HOLY, MD

## 2024-06-04 NOTE — Patient Instructions (Signed)
 Important Information  Due to recent changes in healthcare laws, you may see results of your pathology and/or laboratory studies on MyChart before the doctors have had a chance to review them. We understand that in some cases there may be results that are confusing or concerning to you. Please understand that not all results are received at the same time and often the doctors may need to interpret multiple results in order to provide you with the best plan of care or course of treatment. Therefore, we ask that you please give [place] 2 business days to thoroughly review all your results before contacting the office for clarification. Should we see a critical lab result, you will be contacted sooner.   If You Need Anything After Your Visit  If you have any questions or concerns for your doctor, please call our main line at [phone] If no one answers, please leave a voicemail as directed and we will return your call as soon as possible. Messages left after 4 pm will be answered the following business day.   You may also send [place] a message via MyChart. We typically respond to MyChart messages within 1-2 business days.  For prescription refills, please ask your pharmacy to contact our office. Our fax number is [phone].  If you have an urgent issue when the clinic is closed that cannot wait until the next business day, you can page your doctor at the number below.    Please note that while we do our best to be available for urgent issues outside of office hours, we are not available 24/7.   If you have an urgent issue and are unable to reach [place], you may choose to seek medical care at your doctor's office, retail clinic, urgent care center, or emergency room.  If you have a medical emergency, please immediately call 911 or go to the emergency department. In the event of inclement weather, please call our main line at [phone] for an update on the status of any delays or  closures.  Dermatology Medication Tips: Please keep the boxes that topical medications come in in order to help keep track of the instructions about where and how to use these. Pharmacies typically print the medication instructions only on the boxes and not directly on the medication tubes.   If your medication is too expensive, please contact our office at [phone] or send [place] a message through MyChart.   We are unable to tell what your co-pay for medications will be in advance as this is different depending on your insurance coverage. However, we may be able to find a substitute medication at lower cost or fill out paperwork to get insurance to cover a needed medication.   If a prior authorization is required to get your medication covered by your insurance company, please allow [place] 1-2 business days to complete this process.  Drug prices often vary depending on where the prescription is filled and some pharmacies may offer cheaper prices.  The website www.goodrx.com contains coupons for medications through different pharmacies. The prices here do not account for what the cost may be with help from insurance (it may be cheaper with your insurance), but the website can give you the price if you did not use any insurance.  - You can print the associated coupon and take it with your prescription to the pharmacy.  - You may also stop by our office during regular business hours and pick up a GoodRx coupon card.  - If  you need your prescription sent electronically to a different pharmacy, notify our office through [facility] or by phone at [phone]    Skin Education :   I counseled the patient regarding the following: Sun screen (SPF 30 or greater) should be applied during peak UV exposure (between 10am and 2pm) and reapplied after exercise or swimming.  The ABCDEs of melanoma were reviewed with the patient, and the importance of monthly self-examination of moles was emphasized.  Should any moles change in shape or color, or itch, bleed or burn, pt will contact our office for evaluation sooner then their interval appointment.  Plan: Sunscreen Recommendations I recommended a broad spectrum sunscreen with a SPF of 30 or higher. I explained that SPF 30 sunscreens block approximately 97 percent of the sun's harmful rays. Sunscreens should be applied at least 15 minutes prior to expected sun exposure and then every 2 hours after that as Joo as sun exposure continues. If swimming or exercising sunscreen should be reapplied every 45 minutes to an hour after getting wet or sweating. One ounce, or the equivalent of a shot glass full of sunscreen, is adequate to protect the skin not covered by a bathing suit. I also recommended a lip balm with a sunscreen as well. Sun protective clothing can be used in lieu of sunscreen but must be worn the entire time you are exposed to the sun's rays.

## 2024-06-15 DIAGNOSIS — D485 Neoplasm of uncertain behavior of skin: Secondary | ICD-10-CM | POA: Diagnosis not present

## 2024-06-15 DIAGNOSIS — C44321 Squamous cell carcinoma of skin of nose: Secondary | ICD-10-CM | POA: Diagnosis not present

## 2024-06-15 DIAGNOSIS — D0439 Carcinoma in situ of skin of other parts of face: Secondary | ICD-10-CM | POA: Diagnosis not present

## 2024-06-18 DIAGNOSIS — D849 Immunodeficiency, unspecified: Secondary | ICD-10-CM | POA: Diagnosis not present

## 2024-06-18 DIAGNOSIS — Z944 Liver transplant status: Secondary | ICD-10-CM | POA: Diagnosis not present

## 2024-06-24 ENCOUNTER — Other Ambulatory Visit: Payer: Self-pay | Admitting: Family

## 2024-06-24 DIAGNOSIS — L509 Urticaria, unspecified: Secondary | ICD-10-CM

## 2024-07-07 ENCOUNTER — Ambulatory Visit

## 2024-07-07 VITALS — BP 112/72 | HR 84 | Temp 97.1°F | Resp 18 | Ht 64.0 in | Wt 148.0 lb

## 2024-07-07 DIAGNOSIS — Z0000 Encounter for general adult medical examination without abnormal findings: Secondary | ICD-10-CM | POA: Diagnosis not present

## 2024-07-07 DIAGNOSIS — Z13820 Encounter for screening for osteoporosis: Secondary | ICD-10-CM

## 2024-07-07 NOTE — Patient Instructions (Signed)
 Ms. Shealy , Thank you for taking time out of your busy schedule to complete your Annual Wellness Visit with me. I enjoyed our conversation and look forward to speaking with you again next year. I, as well as your care team,  appreciate your ongoing commitment to your health goals. Please review the following plan we discussed and let me know if I can assist you in the future. Your Game plan/ To Do List    Referrals: If you haven't heard from the office you've been referred to, please reach out to them at the phone provided.   Follow up Visits: We will see or speak with you next year for your Next Medicare AWV with our clinical staff on 07/08/25 at 8:40a.m. Have you seen your provider in the last 6 months (3 months if uncontrolled diabetes)? Yes  Clinician Recommendations:  Aim for 30 minutes of exercise or brisk walking, 6-8 glasses of water , and 5 servings of fruits and vegetables each day.       This is a list of the screenings recommended for you:  Health Maintenance  Topic Date Due   COVID-19 Vaccine (4 - 2024-25 season) 07/14/2023   DEXA scan (bone density measurement)  08/26/2023   Medicare Annual Wellness Visit  07/03/2024   Flu Shot  06/12/2024   Mammogram  03/10/2026   Colon Cancer Screening  03/17/2026   Colon Cancer Screening  02/26/2028   DTaP/Tdap/Td vaccine (4 - Td or Tdap) 06/30/2029   Pneumococcal Vaccine for age over 28  Completed   Hepatitis C Screening  Completed   Zoster (Shingles) Vaccine  Completed   HPV Vaccine  Aged Out   Meningitis B Vaccine  Aged Out   Hepatitis B Vaccine  Discontinued    Advanced directives: (Declined) Advance directive discussed with you today. Even though you declined this today, please call our office should you change your mind, and we can give you the proper paperwork for you to fill out. Advance Care Planning is important because it:  [x]  Makes sure you receive the medical care that is consistent with your values, goals, and  preferences  [x]  It provides guidance to your family and loved ones and reduces their decisional burden about whether or not they are making the right decisions based on your wishes.  Follow the link provided in your after visit summary or read over the paperwork we have mailed to you to help you started getting your Advance Directives in place. If you need assistance in completing these, please reach out to us  so that we can help you!  See attachments for Preventive Care and Fall Prevention Tips.

## 2024-07-07 NOTE — Progress Notes (Signed)
 Subjective:   Cassidy Barber is a 73 y.o. who presents for a Medicare Wellness preventive visit.  As a reminder, Annual Wellness Visits don't include a physical exam, and some assessments may be limited, especially if this visit is performed virtually. We may recommend an in-person follow-up visit with your provider if needed.  Visit Complete: Virtual I connected with  Cassidy Barber on 07/07/24 by a audio enabled telemedicine application and verified that I am speaking with the correct person using two identifiers.  Patient Location: Home  Provider Location: Home Office  I discussed the limitations of evaluation and management by telemedicine. The patient expressed understanding and agreed to proceed.  Vital Signs: Because this visit was a virtual/telehealth visit, some criteria may be missing or patient reported. Any vitals not documented were not able to be obtained and vitals that have been documented are patient reported.  VideoDeclined- This patient declined Librarian, academic. Therefore the visit was completed with audio only.  Persons Participating in Visit: Patient.  AWV Questionnaire: Yes: Patient Medicare AWV questionnaire was completed by the patient on 07/06/24; I have confirmed that all information answered by patient is correct and no changes since this date.        Objective:    Today's Vitals   07/07/24 0845  BP: 112/72  Pulse: 84  Weight: 148 lb (67.1 kg)  Height: 5' 4 (1.626 m)   Body mass index is 25.4 kg/m.     07/07/2024    8:48 AM 05/13/2024    8:07 AM 03/17/2024    6:59 AM 01/10/2024    7:28 PM 01/03/2024   10:49 AM 12/27/2023   11:27 AM 12/20/2023    9:25 AM  Advanced Directives  Does Patient Have a Medical Advance Directive? No No No No No No No  Would patient like information on creating a medical advance directive?  No - Patient declined No - Patient declined No - Patient declined No - Patient declined No - Patient  declined No - Patient declined    Current Medications (verified) Outpatient Encounter Medications as of 07/07/2024  Medication Sig   acetaminophen  (TYLENOL ) 500 MG tablet Take 1,000 mg by mouth every 6 (six) hours as needed (for pain.).    alendronate  (FOSAMAX ) 70 MG tablet TAKE 1 TABLET BY MOUTH ONCE A WEEK   ALPRAZolam  (XANAX ) 0.5 MG tablet Take 1 tablet (0.5 mg total) by mouth at bedtime.   Biotin w/ Vitamins C & E (HAIR SKIN & NAILS GUMMIES PO) Take 2 tablets by mouth daily.   Calcium Carb-Cholecalciferol  (CALCIUM 600 + D PO) Take 1 tablet by mouth 2 (two) times daily.   cetirizine  (ZYRTEC ) 10 MG tablet Take 1 tablet (10 mg total) by mouth daily as needed for allergies.   Cholecalciferol  (VITAMIN D ) 50 MCG (2000 UT) tablet Take 2,000 Units by mouth daily.   Clobetasol  Prop Emollient Base (CLOBETASOL  PROPIONATE E) 0.05 % emollient cream Apply 1 application. topically 2 (two) times daily. (Patient taking differently: Apply 1 application  topically 2 (two) times daily as needed (rash).)   clotrimazole -betamethasone  (LOTRISONE ) cream Apply 1 application topically 2 (two) times daily. (Patient taking differently: Apply 1 application  topically daily as needed (Rash).)   cyanocobalamin  (VITAMIN B12) 1000 MCG tablet Take 1 tablet (1,000 mcg total) by mouth daily.   famotidine  (PEPCID ) 20 MG tablet TAKE ONE (1) TABLET BY MOUTH TWO (2) TIMES DAILY   fluorouracil (EFUDEX) 5 % cream Apply 1 application topically daily  as needed (cancer spots).    fluticasone  (FLONASE ) 50 MCG/ACT nasal spray Place 2 sprays into both nostrils daily.   gabapentin  (NEURONTIN ) 100 MG capsule Take 1 capsule (100 mg total) by mouth 3 (three) times daily as needed.   hydrocortisone cream 1 % Apply 1 Application topically 2 (two) times daily as needed for itching.   hydrOXYzine  (VISTARIL ) 25 MG capsule Take 1 capsule (25 mg total) by mouth every 8 (eight) hours as needed.   ketoconazole  (NIZORAL ) 2 % cream Apply 1 Application  topically 2 (two) times daily.   levothyroxine  (SYNTHROID ) 112 MCG tablet TAKE ONE (1) TABLET BY MOUTH EVERY DAY   losartan  (COZAAR ) 25 MG tablet Take 1 tablet (25 mg total) by mouth daily.   metoprolol  succinate (TOPROL -XL) 25 MG 24 hr tablet TAKE ONE (1) TABLET BY MOUTH EVERY DAY   Multiple Vitamins-Minerals (MULTIVITAMIN WITH MINERALS) tablet Take 1 tablet by mouth daily.   mupirocin  ointment (BACTROBAN ) 2 % Apply 1 application topically daily as needed (Cancer spots).   ondansetron  (ZOFRAN ) 4 MG tablet Take 1 tablet (4 mg total) by mouth every 8 (eight) hours as needed for nausea or vomiting.   pantoprazole  (PROTONIX ) 40 MG tablet Take 1 tablet (40 mg total) by mouth daily.   Probiotic Product (PROBIOTIC PO) Take 1 capsule by mouth daily.   SUMAtriptan  (IMITREX ) 50 MG tablet Take 1 tablet (50 mg total) by mouth every 2 (two) hours as needed for migraine. May repeat in 2 hours if headache persists or recurs.   ursodiol  (ACTIGALL ) 300 MG capsule TAKE 3 CAPSULES BY MOUTH DAILY   vitamin C (ASCORBIC ACID) 250 MG tablet Take 250 mg by mouth daily.   Vitamin D , Ergocalciferol , (DRISDOL ) 1.25 MG (50000 UNIT) CAPS capsule TAKE 1 CAPSULE BY MOUTH ONCE A WEEK   vitamin E 180 MG (400 UNITS) capsule Take 400 Units by mouth daily.   No facility-administered encounter medications on file as of 07/07/2024.    Allergies (verified) Ciprofloxacin  and Codeine   History: Past Medical History:  Diagnosis Date   Abdominal wall hernia    Incarcerated status post surgical repair 2019 - [facility]   Anemia of chronic disease    Atypical nevus 01/21/2018   atypical neoplasm- Left scalp-ant (txpbx + MOHS), atypical neoplasm- Left scalp post- (txpbx + MOHS)   Basal cell carcinoma    Colon cancer (HCC)    Colon surgery 2005 and 2012   History of pulmonary hypertension    Pre liver transplant   Hypertension    Hypothyroidism    Lynch syndrome    Osteopenia    Primary biliary cirrhosis (HCC)    Status post  liver transplantation - follows at [facility]   SCCA (squamous cell carcinoma) of skin 02/23/2020   Right Upper Chest(moderate) (MOH's)   SCCA (squamous cell carcinoma) of skin 04/07/2020   Left Top Leg (Keratoacanthoma) treatment after biopsy   SCCA (squamous cell carcinoma) of skin 04/07/2020   Left Foot Dorsal (in situ) treatment after biopsy   SCCA (squamous cell carcinoma) of skin 03/27/2021   Right Upper Back (Keratoacanthoma) (excision) (clear)   SCCA (squamous cell carcinoma) of skin 06/13/2021   Right Shoulder - anterior (moderately differentiated) (tx p bx)   SCCA (squamous cell carcinoma) of skin 06/13/2021   Right Thigh - anterior (well differentiated) (tx p bx)   SCCA (squamous cell carcinoma) of skin 06/13/2021   Right Lower Leg - anterior (well differentiated) (tx p bx)   Squamous cell carcinoma of skin  04/22/2018   KA-Right mid chest (txpbx), KA-left elbow crease (txpbx), insitu-Right mid chest inf. (exc)   Squamous cell carcinoma of skin 05/20/2018   well diff-Left upper shin (txpbx), well diff-Right lower forearm (txpbx), well diff-Right upper shin (txpbx)   Squamous cell carcinoma of skin 06/11/2018   Scc + margin-Right mid chest inferior    Squamous cell carcinoma of skin 06/27/2018   well diff-Left mid thigh(txpbx), well diff-Left inner thigh (txpbx),insitu-Right cheek (txpbx),well diff-right inner heel (txpbx)   Squamous cell carcinoma of skin 09/16/2018   well diff-left shoulder (txpbx), well diff-Right chin (txpbx), well diff-right chest lateral (CX35FU)   Squamous cell carcinoma of skin 10/01/2018   Right outer lower shin (Txpbx)   Squamous cell carcinoma of skin 04/03/2019   well diff-Right center chest (MOHS), in situ-Right ear   Squamous cell carcinoma of skin 08/05/2019   in situ-Left calf (txpbx), in situ-left bicep (txpbx), well diff-Left chest,inf(txpbx), in situ-Right chest inf-(txpbx)   Squamous cell carcinoma of skin 11/19/2019   KA-left top leg (txpbx),  modify-Riight forehead-(mohs), in situ-right hand (txpbx), in situ-Right forearm (txpbx), well diff-Right chest (txpbx), well diff-chin (txpbx)   Squamous cell carcinoma of skin 01/06/2020   KA- Left top leg   Squamous cell carcinoma of skin 05/12/2013   bowens-middle of chest (CX35FU)   Squamous cell carcinoma of skin 05/18/2015   well diff-Left upper arm (CX35FU + Exc),KA-right chest(txpbx), in situ-Left shin (txpbx), well diff-Right cheek (CX35FU), KA-Left post scalp (CX35FU)   Squamous cell carcinoma of skin 08/09/2015   KA-Left post scalp ((MOHS), in situ- mid chest (Txpbx +exc), in situ-Left upper arm inferior (txpbx)   Squamous cell carcinoma of skin 10/13/2015   Left upper arm-clear   Squamous cell carcinoma of skin 03/09/2016   mod diff-mid chest (txpbx+ exc), mod diff-Right chest (txpbx+exc), well diff-right cheek-(txpbx),well diff-Left hand-(txpbx), in situ-Left upper arm (txpbx), well diff-Right cheek -(txpbx), well diff-Right crease arm (txpbx)    Squamous cell carcinoma of skin 05/24/2016   well diff-Right nasal crease-(MOHS)   Squamous cell carcinoma of skin 08/02/2016   KA-Left chest med (txpbx)   Squamous cell carcinoma of skin 08/30/2016   in situ-Left outer zygoma (txpbx)   Squamous cell carcinoma of skin 12/06/2016   well diff-Left chest sup, Left shoudler, insitu- right post scalp   Squamous cell carcinoma of skin 02/14/2017   well diff-Left forearm (EXC),in situ-RIght ant neck   Squamous cell carcinoma of skin 06/14/2017   in situ-Right forearm (txpbx), in situ-Right chest (txpbx), well diff-left chest (txpbx), well diff-anterior neck- (txpbx)   Squamous cell carcinoma of skin 08/07/2017   well diff-Left upper shoulder (txpbx), sup and invasive-Left temple (txpbx), well diff-Right upper shin (txpbx), in situ-Right clavicle (txpbx)   Squamous cell carcinoma of skin 10/17/2017   well diff-ant. neck (MOHS), in situ-Right chest, inf (txpbx)   Squamous cell carcinoma of  skin 01/21/2018   well diff- Right chest,ulnar (txpbx), well diff- right upper chest (txpbx), in situ-Right ant. crown (txpbx)   Squamous cell carcinoma of skin 08/02/2020   well diff-left lower leg-inferior (Txpbx)   Squamous cell carcinoma of skin 08/02/2020   well diff-right lower leg-mid (txpbx)   Squamous cell carcinoma of skin 08/02/2020   well diff-left chest upper   Squamous cell carcinoma of skin 08/02/2020   well diff-mid parietal scalp (MOHS)   Squamous cell carcinoma of skin 08/02/2020   well diff-right foot inner(txpbx)   Squamous cell carcinoma of skin 08/02/2020   well diff- left lower  leg medial (txpbx)   Squamous cell carcinoma of skin 08/02/2020   well diff-left lower leg anterior (txpbx)   Squamous cell carcinoma of skin 08/02/2020   well diff-left lower leg medial (txpbx)   Squamous cell carcinoma of skin 08/02/2020   well diff-right forearm-posterior (txpbx)   Past Surgical History:  Procedure Laterality Date   ABDOMINAL HERNIA REPAIR     Patient's states that she has had 8- 9 hernia surgeries   ABDOMINAL HYSTERECTOMY     BIOPSY  02/15/2021   Procedure: BIOPSY;  Surgeon: Golda Claudis PENNER, MD;  [facility];  Service: Endoscopy;;   BIOPSY  12/20/2021   Procedure: BIOPSY;  Surgeon: Golda Claudis PENNER, MD;  [facility];  Service: Endoscopy;;   CATARACT EXTRACTION W/PHACO Right 01/15/2020   Procedure: CATARACT EXTRACTION PHACO AND INTRAOCULAR LENS PLACEMENT (IOC);  Surgeon: Harrie Agent, MD;  [facility]: [facility];  Service: Ophthalmology;  Laterality: Right;  CDE: 7.89   CATARACT EXTRACTION W/PHACO Left 01/29/2020   Procedure: CATARACT EXTRACTION PHACO AND INTRAOCULAR LENS PLACEMENT (IOC) (CDE: 6.33);  Surgeon: Harrie Agent, MD;  Location: [facility];  Service: Ophthalmology;  Laterality: Left;   CHOLECYSTECTOMY  2007   COLON SURGERY  2008   Done at [facility]   COLONOSCOPY     Done at [facility]   COLONOSCOPY N/A 03/17/2024   Procedure: COLONOSCOPY;  Surgeon:  Eartha Angelia Sieving, MD;  [facility];  Service: Gastroenterology;  Laterality: N/A;  7:30A,;ASA 3   ESOPHAGOGASTRODUODENOSCOPY N/A 08/21/2018   Procedure: ESOPHAGOGASTRODUODENOSCOPY (EGD);  Surgeon: Golda Claudis PENNER, MD;  [facility];  Service: Endoscopy;  Laterality: N/A;   ESOPHAGOGASTRODUODENOSCOPY N/A 03/17/2024   Procedure: EGD (ESOPHAGOGASTRODUODENOSCOPY);  Surgeon: Eartha Angelia, Sieving, MD;  [facility];  Service: Gastroenterology;  Laterality: N/A;  7:30AM;ASA 3   ESOPHAGOGASTRODUODENOSCOPY (EGD) WITH PROPOFOL  N/A 12/16/2019   Procedure: ESOPHAGOGASTRODUODENOSCOPY (EGD) WITH PROPOFOL ;  Surgeon: Golda Claudis PENNER, MD;  [facility];  Service: Endoscopy;  Laterality: N/A;   ESOPHAGOGASTRODUODENOSCOPY (EGD) WITH PROPOFOL  N/A 12/20/2021   Procedure: ESOPHAGOGASTRODUODENOSCOPY (EGD) WITH PROPOFOL ;  Surgeon: Golda Claudis PENNER, MD;  [facility];  Service: Endoscopy;  Laterality: N/A;  9:05   ESOPHAGOGASTRODUODENOSCOPY (EGD) WITH PROPOFOL  N/A 02/14/2022   Procedure: ESOPHAGOGASTRODUODENOSCOPY (EGD) WITH PROPOFOL ;  Surgeon: Golda Claudis PENNER, MD;  [facility];  Service: Endoscopy;  Laterality: N/A;  730   ESOPHAGOGASTRODUODENOSCOPY (EGD) WITH PROPOFOL  N/A 06/04/2022   Procedure: ESOPHAGOGASTRODUODENOSCOPY (EGD) WITH PROPOFOL ;  Surgeon: Eartha Angelia Sieving, MD;  [facility];  Service: Gastroenterology;  Laterality: N/A;  145   ESOPHAGOGASTRODUODENOSCOPY (EGD) WITH PROPOFOL  N/A 02/26/2023   Procedure: ESOPHAGOGASTRODUODENOSCOPY (EGD) WITH PROPOFOL ;  Surgeon: Eartha Angelia Sieving, MD;  [facility];  Service: Gastroenterology;  Laterality: N/A;  9:15AM;ASA 3   EYE SURGERY     lasix    FLEXIBLE SIGMOIDOSCOPY N/A 10/20/2015   Procedure: FLEXIBLE SIGMOIDOSCOPY;  Surgeon: Claudis PENNER Golda, MD;  [facility];  Service: Endoscopy;  Laterality: N/A;  77 - Dr Golda has meeting until 1:00    FLEXIBLE SIGMOIDOSCOPY N/A 07/11/2016   Procedure: FLEXIBLE SIGMOIDOSCOPY;  Surgeon: Claudis PENNER Golda, MD;  [facility];  Service: Endoscopy;  Laterality: N/A;  1200   FLEXIBLE SIGMOIDOSCOPY N/A 08/09/2017   Procedure: FLEXIBLE SIGMOIDOSCOPY;  Surgeon: Golda Claudis PENNER, MD;  [facility];  Service: Endoscopy;  Laterality: N/A;  1:00   FLEXIBLE SIGMOIDOSCOPY N/A 08/21/2018   Procedure: FLEXIBLE SIGMOIDOSCOPY;  Surgeon: Golda Claudis PENNER, MD;  [facility];  Service: Endoscopy;  Laterality: N/A;   FLEXIBLE SIGMOIDOSCOPY N/A 12/16/2019   Procedure: FLEXIBLE SIGMOIDOSCOPY wirh Propofol ;  Surgeon: Golda Claudis PENNER, MD;  [facility];  Service: Endoscopy;  Laterality: N/A;  7:30   FLEXIBLE SIGMOIDOSCOPY N/A 02/15/2021   Procedure: FLEXIBLE SIGMOIDOSCOPY WITH PROPOFOL ;  Surgeon: Golda Claudis PENNER, MD;  [facility];  Service: Endoscopy;  Laterality: N/A;  am   FLEXIBLE SIGMOIDOSCOPY N/A 12/20/2021   Procedure: FLEXIBLE SIGMOIDOSCOPY;  Surgeon: Golda Claudis PENNER, MD;  [facility];  Service: Endoscopy;  Laterality: N/A;   FLEXIBLE SIGMOIDOSCOPY N/A 02/26/2023   Procedure: FLEXIBLE SIGMOIDOSCOPY;  Surgeon: Eartha Angelia Sieving, MD;  [facility];  Service: Gastroenterology;  Laterality: N/A;  9:15AM; ASA 3   FRACTURE SURGERY     right wrist metal plate   GI RADIOFREQUENCY ABLATION N/A 02/14/2022   Procedure: GI RADIOFREQUENCY ABLATION;  Surgeon: Golda Claudis PENNER, MD;  [facility];  Service: Endoscopy;  Laterality: N/A;   HOT HEMOSTASIS N/A 12/20/2021   Procedure: HOT HEMOSTASIS (ARGON PLASMA COAGULATION/BICAP);  Surgeon: Golda Claudis PENNER, MD;  [facility];  Service: Endoscopy;  Laterality: N/A;   HOT HEMOSTASIS  06/04/2022   Procedure: HOT HEMOSTASIS (ARGON PLASMA COAGULATION/BICAP);  Surgeon: Eartha Angelia, Sieving, MD;  [facility];  Service: Gastroenterology;;   HOT HEMOSTASIS  02/26/2023    Procedure: HOT HEMOSTASIS (ARGON PLASMA COAGULATION/BICAP);  Surgeon: Eartha Angelia, Sieving, MD;  [facility];  Service: Gastroenterology;;   IRRIGATION AND DEBRIDEMENT OF WOUND WITH SPLIT THICKNESS SKIN GRAFT N/A 05/13/2023   Procedure: Excision of scalp wound with Myriad or Acell placement;  Surgeon: Lowery Estefana RAMAN, DO;  Location: [facility];  Service: Plastics;  Laterality: N/A;   LIVER TRANSPLANT  11/19/2013   POLYPECTOMY  08/09/2017   Procedure: POLYPECTOMY;  Surgeon: Golda Claudis PENNER, MD;  [facility];  Service: Endoscopy;;  colon small bowel   POLYPECTOMY N/A 02/14/2022   Procedure: POLYPECTOMY;  Surgeon: Golda Claudis PENNER, MD;  [facility];  Service: Endoscopy;  Laterality: N/A;   REVERSE SHOULDER ARTHROPLASTY Left 07/17/2018   Procedure: LEFT REVERSE SHOULDER ARTHROPLASTY;  Surgeon: Melita Drivers, MD;  Location: [facility];  Service: Orthopedics;  Laterality: Left;    REVERSE SHOULDER ARTHROPLASTY Right 07/20/2021   Procedure: REVERSE SHOULDER ARTHROPLASTY;  Surgeon: Melita Drivers, MD;  Location: [facility];  Service: Orthopedics;  Laterality: Right;   SHOULDER CLOSED REDUCTION Left 09/27/2019   Procedure: CLOSED REDUCTION SHOULDER;  Surgeon: Ernie Cough, MD;  Location: [facility];  Service: Orthopedics;  Laterality: Left;   SPLENECTOMY  2006   TOTAL SHOULDER REVISION Left 11/12/2019   Procedure: Revision Left Reverse Shoulder Arthroplasty with poly exchange SDD;  Surgeon: Melita Drivers, MD;  Location: [facility];  Service: Orthopedics;  Laterality: Left;  -SDDC   TYMPANOSTOMY TUBE PLACEMENT     UPPER GASTROINTESTINAL ENDOSCOPY     Done at [facility]   Family History  Problem Relation Age of Onset   Prostate cancer Father    Colon cancer Father    Colon cancer Sister    Lung cancer Sister    Healthy Son    Alcohol abuse Brother    Allergic rhinitis Neg Hx    Asthma Neg Hx    Eczema Neg Hx    Urticaria Neg Hx    Social History    Socioeconomic History  Marital status: Married    Spouse name: Johnny   Number of children: 1   Years of education: 12   Highest education level: 12th grade  Occupational History   Occupation: Disability    Employer: [employer]    Comment: Immunologist  Tobacco Use   Smoking status: Never   Smokeless tobacco: Never  Vaping Use   Vaping status: Never Used  Substance and Sexual Activity   Alcohol use: No   Drug use: No   Sexual activity: Not Currently    Birth control/protection: None  Other Topics Concern   Not on file  Social History Narrative   Patient lives in a two story home with her husband. She has an adult son. She is retired from being an Environmental health practitioner for 30 years.    Social Drivers of Corporate investment banker Strain: Low Risk  (05/08/2024)   Overall Financial Resource Strain (CARDIA)    Difficulty of Paying Living Expenses: Not hard at all  Food Insecurity: No Food Insecurity (05/08/2024)   Hunger Vital Sign    Worried About Running Out of Food in the Last Year: Never true    Ran Out of Food in the Last Year: Never true  Transportation Needs: No Transportation Needs (05/08/2024)   PRAPARE - Administrator, Civil Service (Medical): No    Lack of Transportation (Non-Medical): No  Physical Activity: Insufficiently Active (05/08/2024)   Exercise Vital Sign    Days of Exercise per Week: 1 day    Minutes of Exercise per Session: 30 min  Stress: No Stress Concern Present (05/08/2024)   [employer] of Occupational Health - Occupational Stress Questionnaire    Feeling of Stress: Only a little  Social Connections: Socially Integrated (05/08/2024)   Social Connection and Isolation Panel    Frequency of Communication with Friends and Family: Once a week    Frequency of Social Gatherings with Friends and Family: More than three times a week    Attends Religious Services: More than 4 times per year    Active Member of [employer] or  Organizations: Yes    Attends Banker Meetings: 1 to 4 times per year    Marital Status: Married    Tobacco Counseling Counseling given: Yes    Clinical Intake:  Pre-visit preparation completed: Yes  Pain : No/denies pain     BMI - recorded: 25.4 Nutritional Status: BMI 25 -29 Overweight Nutritional Risks: None Diabetes: No  No results found for: HGBA1C   How often do you need to have someone help you when you read instructions, pamphlets, or other written materials from your doctor or pharmacy?: 1 - Never  Interpreter Needed?: No  Information entered by :: alia t/cma   Activities of Daily Living     07/06/2024    8:18 PM  In your present state of health, do you have any difficulty performing the following activities:  Hearing? 0  Vision? 0  Difficulty concentrating or making decisions? 0  Walking or climbing stairs? 0  Dressing or bathing? 0  Doing errands, shopping? 0  Preparing Food and eating ? N  Using the Toilet? N  In the past six months, have you accidently leaked urine? N  Do you have problems with loss of bowel control? N  Managing your Medications? N  Managing your Finances? N  Housekeeping or managing your Housekeeping? N    Patient Care Team: [name] LABOR, FNP as PCP - General (Family  Medicine) Debera Jayson MATSU, MD as PCP - Cardiology (Cardiology) Joshua Alm Hamilton, MD as Consulting Physician (Neurosurgery) Sheffield, Andrez SAUNDERS, PA-C (Inactive) as Physician Assistant (Dermatology) Livingston Rigg, MD as Consulting Physician (Dermatology) Lars Lupita Ambrosia, MD as Referring Physician (Gastroenterology) Golda Claudis PENNER, MD (Inactive) as Consulting Physician (Gastroenterology) Melita Drivers, MD as Consulting Physician (Orthopedic Surgery)  I have updated your Care Teams any recent Medical Services you may have received from other providers in the past year.     Assessment:   This is a routine wellness examination for  Elanie.  Hearing/Vision screen Hearing Screening - Comments:: Pt denies hearing dif Vision Screening - Comments:: Pt denies vision dif/pt goes to [facility] in [place]/last ov 4/25   Goals Addressed   None    Depression Screen     07/07/2024    8:48 AM 05/13/2024    8:06 AM 05/08/2024   11:45 AM 07/04/2023   10:00 AM 07/04/2023    8:33 AM 05/22/2023    9:51 AM 01/10/2023    8:29 AM  PHQ 2/9 Scores  PHQ - 2 Score 0 0 0 1 0 1 1  PHQ- 9 Score 0 0 0 3 0 6 3    Fall Risk     07/06/2024    8:18 PM 05/08/2024   11:49 AM 07/04/2023   10:00 AM 07/04/2023    8:30 AM 06/30/2023    8:45 AM  Fall Risk   Falls in the past year? 0 0 0 0 0  Number falls in past yr:  0 0 0   Injury with Fall?  0 0 0   Risk for fall due to :  No Fall Risks History of fall(s) No Fall Risks   Follow up  Falls evaluation completed;Education provided Falls evaluation completed Falls prevention discussed     MEDICARE RISK AT HOME:  Medicare Risk at Home Any stairs in or around the home?: (Patient-Rptd) Yes If so, are there any without handrails?: (Patient-Rptd) Yes Home free of loose throw rugs in walkways, pet beds, electrical cords, etc?: (Patient-Rptd) Yes Adequate lighting in your home to reduce risk of falls?: (Patient-Rptd) Yes Life alert?: (Patient-Rptd) No Use of a cane, walker or w/c?: (Patient-Rptd) No Grab bars in the bathroom?: (Patient-Rptd) No Shower chair or bench in shower?: (Patient-Rptd) No Elevated toilet seat or a handicapped toilet?: (Patient-Rptd) No  TIMED UP AND GO:  Was the test performed?  no  Cognitive Function: 6CIT completed    12/12/2017   10:29 AM  MMSE - Mini Mental State Exam  Orientation to time 5   Orientation to Place 5   Registration 3   Attention/ Calculation 5   Recall 3   Language- name 2 objects 2   Language- repeat 1  Language- follow 3 step command 3   Language- read & follow direction 1   Write a sentence 1   Copy design 0   Total score 29      Data saved  with a previous flowsheet row definition        07/04/2023    8:35 AM 07/02/2022    9:09 AM 06/29/2021    9:17 AM 06/27/2020    9:32 AM 06/23/2019    8:38 AM  6CIT Screen  What Year? 0 points 0 points 0 points 0 points 0 points  What month? 0 points 0 points 0 points 0 points 0 points  What time? 0 points 0 points 0 points 0 points 0 points  Count back  from 20 0 points 0 points 0 points 2 points 0 points  Months in reverse 0 points 0 points 0 points 2 points 0 points  Repeat phrase 0 points 2 points 4 points 0 points 2 points  Total Score 0 points 2 points 4 points 4 points 2 points    Immunizations Immunization History  Administered Date(s) Administered   Fluad Quad(high Dose 65+) 08/21/2019, 08/25/2020, 08/23/2021   HIB (PRP-T) 01/15/2003   Hepatitis A, Adult 06/28/2020, 01/02/2021   Hepatitis B, ADULT 02/28/2004, 03/27/2004, 08/21/2004   INFLUENZA, HIGH DOSE SEASONAL PF 07/18/2018, 08/05/2019, 01/10/2023, 09/18/2023   Influenza Split 08/06/2011, 09/18/2012, 09/13/2015   Influenza,inj,Quad PF,6+ Mos 08/07/2010, 07/31/2013, 08/16/2014, 09/01/2015   Influenza-Unspecified 07/13/2013, 09/01/2015, 08/27/2016, 07/26/2017, 07/17/2018, 08/31/2019   Meningococcal polysaccharide vaccine (MPSV4) 01/15/2003   Moderna Sars-Covid-2 Vaccination 01/07/2020, 02/05/2020, 07/13/2020   PNEUMOCOCCAL CONJUGATE-20 02/13/2023   Pneumococcal Conjugate-13 09/01/2015, 07/26/2017   Pneumococcal Polysaccharide-23 01/15/2003, 08/06/2011, 07/13/2017   Pneumococcal-Unspecified 07/13/2012   Tdap 08/28/2016, 03/31/2018, 07/01/2019   Zoster Recombinant(Shingrix) 04/21/2018, 06/23/2018    Screening Tests Health Maintenance  Topic Date Due   COVID-19 Vaccine (4 - 2024-25 season) 07/14/2023   DEXA SCAN  08/26/2023   Medicare Annual Wellness (AWV)  07/03/2024   INFLUENZA VACCINE  06/12/2024   MAMMOGRAM  03/10/2026   Colonoscopy  03/17/2026   COLON CANCER SCREENING 5 YEAR SIGMOIDOSCOPY  02/26/2028    DTaP/Tdap/Td (4 - Td or Tdap) 06/30/2029   Pneumococcal Vaccine: 50+ Years  Completed   Hepatitis C Screening  Completed   Zoster Vaccines- Shingrix  Completed   HPV VACCINES  Aged Out   Meningococcal B Vaccine  Aged Out   Hepatitis B Vaccines 19-59 Average Risk  Discontinued    Health Maintenance  Health Maintenance Due  Topic Date Due   COVID-19 Vaccine (4 - 2024-25 season) 07/14/2023   DEXA SCAN  08/26/2023   Medicare Annual Wellness (AWV)  07/03/2024   INFLUENZA VACCINE  06/12/2024   Health Maintenance Items Addressed: DEXA ordered  Additional Screening:  Vision Screening: Recommended annual ophthalmology exams for early detection of glaucoma and other disorders of the eye. Would you like a referral to an eye doctor? No    Dental Screening: Recommended annual dental exams for proper oral hygiene  Community Resource Referral / Chronic Care Management: CRR required this visit?  No   CCM required this visit?  No   Plan:    I have personally reviewed and noted the following in the patient's chart:   Medical and social history Use of alcohol, tobacco or illicit drugs  Current medications and supplements including opioid prescriptions. Patient is not currently taking opioid prescriptions. Functional ability and status Nutritional status Physical activity Advanced directives List of other physicians Hospitalizations, surgeries, and ER visits in previous 12 months Vitals Screenings to include cognitive, depression, and falls Referrals and appointments  In addition, I have reviewed and discussed with patient certain preventive protocols, quality metrics, and best practice recommendations. A written personalized care plan for preventive services as well as general preventive health recommendations were provided to patient.   Ozie Ned, CMA   07/07/2024   After Visit Summary: (MyChart) Due to this being a telephonic visit, the after visit summary with patients  personalized plan was offered to patient via MyChart   Notes: Nothing significant to report at this time.

## 2024-07-17 ENCOUNTER — Telehealth: Payer: Self-pay

## 2024-07-17 ENCOUNTER — Ambulatory Visit (INDEPENDENT_AMBULATORY_CARE_PROVIDER_SITE_OTHER)

## 2024-07-17 DIAGNOSIS — Z13820 Encounter for screening for osteoporosis: Secondary | ICD-10-CM

## 2024-07-17 DIAGNOSIS — M810 Age-related osteoporosis without current pathological fracture: Secondary | ICD-10-CM | POA: Diagnosis not present

## 2024-07-17 DIAGNOSIS — Z780 Asymptomatic menopausal state: Secondary | ICD-10-CM | POA: Diagnosis not present

## 2024-07-17 NOTE — Telephone Encounter (Signed)
 Called and informed patient that she can come in today anytime before 3

## 2024-07-17 NOTE — Telephone Encounter (Signed)
 Copied from CRM [id number]. Topic: Appointments - Scheduling Inquiry for Clinic >> Jul 17, 2024  8:53 AM Emylou G wrote: Reason for CRM: Was told she was suppose to have bone density today.. I don't see it.. called cal - they adv will call her for the appt.

## 2024-07-20 ENCOUNTER — Ambulatory Visit: Payer: Self-pay | Admitting: Family

## 2024-07-20 DIAGNOSIS — Z944 Liver transplant status: Secondary | ICD-10-CM | POA: Diagnosis not present

## 2024-07-20 DIAGNOSIS — D849 Immunodeficiency, unspecified: Secondary | ICD-10-CM | POA: Diagnosis not present

## 2024-07-20 DIAGNOSIS — M810 Age-related osteoporosis without current pathological fracture: Secondary | ICD-10-CM

## 2024-07-21 ENCOUNTER — Telehealth: Payer: Self-pay

## 2024-07-21 NOTE — Progress Notes (Signed)
 Care Guide Pharmacy Note  07/21/2024 Name: ZAKKIYYA BARNO MRN: [id number] DOB: 1951/03/04  Referred By: [name] LABOR, FNP Reason for referral: Complex Care Management (Outreach to schedule with Pharm d )   SUSANNAH CARBIN is a 73 y.o. year old female who is a primary care patient of [name] LABOR, FNP.  [name] was referred to the pharmacist for assistance related to: osteoporosis  Successful contact was made with the patient to discuss pharmacy services including being ready for the pharmacist to call at least 5 minutes before the scheduled appointment time and to have medication bottles and any blood pressure readings ready for review. The patient agreed to meet with the pharmacist via telephone visit on (date/time).08/07/2024  Jeoffrey Buffalo , RMA     [facility], [facility] Guide  Direct Dial: [phone]  Website: [email or web address]

## 2024-07-21 NOTE — Progress Notes (Signed)
 Care Guide Pharmacy Note  07/21/2024 Name: CHERYLYN SUNDBY MRN: [id number] DOB: [birth year]  Referred By: [name] LABOR, FNP Reason for referral: Complex Care Management (Outreach to schedule with Pharm d )   ANKITA NEWCOMER is a 73 y.o. year old female who is a primary care patient of [name] LABOR, FNP.  [name] was referred to the pharmacist for assistance related to: osteoporosis  An unsuccessful telephone outreach was attempted today to contact the patient who was referred to the pharmacy team for assistance with medication management. Additional attempts will be made to contact the patient.  Jeoffrey Buffalo , RMA     [facility]  [facility], [facility] Guide  Direct Dial: [phone]  Website: [email or web address]

## 2024-07-30 DIAGNOSIS — C44321 Squamous cell carcinoma of skin of nose: Secondary | ICD-10-CM | POA: Diagnosis not present

## 2024-07-30 DIAGNOSIS — L570 Actinic keratosis: Secondary | ICD-10-CM | POA: Diagnosis not present

## 2024-08-03 ENCOUNTER — Other Ambulatory Visit: Payer: Self-pay | Admitting: Family

## 2024-08-03 DIAGNOSIS — F1320 Sedative, hypnotic or anxiolytic dependence, uncomplicated: Secondary | ICD-10-CM

## 2024-08-03 DIAGNOSIS — Z79899 Other long term (current) drug therapy: Secondary | ICD-10-CM

## 2024-08-03 DIAGNOSIS — F411 Generalized anxiety disorder: Secondary | ICD-10-CM

## 2024-08-05 ENCOUNTER — Other Ambulatory Visit: Payer: Self-pay

## 2024-08-05 DIAGNOSIS — E039 Hypothyroidism, unspecified: Secondary | ICD-10-CM

## 2024-08-05 NOTE — Progress Notes (Signed)
 tsh

## 2024-08-06 ENCOUNTER — Ambulatory Visit (INDEPENDENT_AMBULATORY_CARE_PROVIDER_SITE_OTHER): Admitting: *Deleted

## 2024-08-06 ENCOUNTER — Other Ambulatory Visit

## 2024-08-06 DIAGNOSIS — E039 Hypothyroidism, unspecified: Secondary | ICD-10-CM | POA: Diagnosis not present

## 2024-08-06 DIAGNOSIS — Z23 Encounter for immunization: Secondary | ICD-10-CM

## 2024-08-07 ENCOUNTER — Ambulatory Visit: Payer: Self-pay | Admitting: Family

## 2024-08-07 ENCOUNTER — Other Ambulatory Visit (INDEPENDENT_AMBULATORY_CARE_PROVIDER_SITE_OTHER)

## 2024-08-07 DIAGNOSIS — M818 Other osteoporosis without current pathological fracture: Secondary | ICD-10-CM

## 2024-08-07 LAB — TSH: TSH: 1.08 u[IU]/mL (ref 0.450–4.500)

## 2024-08-07 NOTE — Progress Notes (Signed)
 08/07/2024 Name: Cassidy Barber MRN: [id number] DOB: 1951-06-09  Chief Complaint  Patient presents with   Osteoporosis    Cassidy Barber is a 73 y.o. year old female who presented for a telephone visit.   They were referred to the pharmacist by their PCP for assistance in managing osteoporosis .   Subjective:  Care Team: Primary Care Provider: [name] LABOR, FNP ; Next Scheduled Visit: 11/09/24   Medication Access/Adherence  Current Pharmacy:  [facility] [place] [place] - [address] [place] [address] Phone: [phone] Fax: [phone]  Patient reports affordability concerns with their medications: prolia would be too costly Patient reports access/transportation concerns to their pharmacy: No  Patient reports adherence concerns with their medications:  No     Osteoporosis:  Does report dizziness at times, but no falls  Broken shoulders--2-3 yrs  Cracked ribs Healed well from both encounters S/p liver transplant ; was on high dose prednisone   Current medications: alendronate  since 219 Medications tried in the past:   Current supplements: vitamin D  and calcium  Current physical activity: tries but unable to continuously work out  Most recent DEXA: 07/20/24 CLINICAL DATA:  73 year old Female Postmenopausal. Osteoporosis   History of fragility fracture. Patient is or has been on glucocorticoid therapy. Patient is or has been on bone building therapies.   Exclusions: L1 due to significant degenerative changes.   COMPARISON:  08/23/2021.   FINDINGS: Scan quality: Good.   LUMBAR SPINE (L2-L4):  BMD (in g/cm2): 1.194  T-score: -0.2  Z-score: 1.5   Rate of change from previous exam: No significant rate of change from previous exam.   LEFT FEMORAL NECK:  BMD (in g/cm2): 0.647  T-score: -2.8  Z-score: -1.0   LEFT TOTAL HIP:  BMD (in g/cm2): 0.736  T-score: -2.2  Z-score: -0.6   RIGHT FEMORAL NECK:  BMD (in g/cm2):  0.778  T-score: -1.9  Z-score: -0.1   RIGHT TOTAL HIP:  BMD (in g/cm2): 0.756  T-score: -2.0  Z-score: -0.4   DUAL-FEMUR TOTAL MEAN:   Rate of change from previous exam: No significant rate of change from previous exam.   FRAX 10-YEAR PROBABILITY OF FRACTURE:   FRAX not reported as the lowest BMD is not in the osteopenia range.   IMPRESSION: Osteoporosis based on BMD.  Current medication access support: n/a   Objective:  No results found for: HGBA1C  Lab Results  Component Value Date   CREATININE 0.75 01/28/2024   BUN 17 01/28/2024   NA 136 01/28/2024   K 5.1 01/28/2024   CL 100 01/28/2024   CO2 21 01/28/2024    Lab Results  Component Value Date   CHOL 199 01/10/2023   HDL 75 01/10/2023   LDLCALC 78 01/10/2023   TRIG 285 (H) 01/10/2023   CHOLHDL 2.7 01/10/2023    Medications Reviewed Today     Reviewed by Billee Mliss BIRCH, [facility] (Pharmacist) on 08/07/24 at 1404  Med List Status: <None>   Medication Order Taking? Sig Documenting Provider Last Dose Status Informant  acetaminophen  (TYLENOL ) 500 MG tablet [id number]  Take 1,000 mg by mouth every 6 (six) hours as needed (for pain.).  [provider]  Active Self  alendronate  (FOSAMAX ) 70 MG tablet [id number]  TAKE 1 TABLET BY MOUTH ONCE A WEEK Hawks, Christy A, FNP  Active   ALPRAZolam  (XANAX ) 0.5 MG tablet [id number]  Take 1 tablet (0.5 mg total) by mouth at bedtime. Lavell, Richards  A, FNP  Active   Biotin w/ Vitamins C & E (HAIR SKIN & NAILS GUMMIES PO) [id number]  Take 2 tablets by mouth daily. [provider]  Active Self  Calcium Carb-Cholecalciferol  (CALCIUM 600 + D PO) [id number]  Take 1 tablet by mouth 2 (two) times daily. [provider]  Active Self  cetirizine  (ZYRTEC ) 10 MG tablet [id number]  Take 1 tablet (10 mg total) by mouth daily as needed for allergies. Lavell Lye A, FNP  Active Self  Cholecalciferol  (VITAMIN D ) 50 MCG (2000 UT) tablet [id number]  Take 2,000 Units by mouth  daily. [provider]  Active Self  Clobetasol  Prop Emollient Base (CLOBETASOL  PROPIONATE E) 0.05 % emollient cream [id number]  Apply 1 application. topically 2 (two) times daily.  Patient taking differently: Apply 1 application  topically 2 (two) times daily as needed (rash).   Sheffield, Kelli R, PA-C  Active Self  clotrimazole -betamethasone  (LOTRISONE ) cream [id number]  Apply 1 application topically 2 (two) times daily.  Patient taking differently: Apply 1 application  topically daily as needed (Rash).   Gladis, Mary-Margaret, FNP  Active Self  cyanocobalamin  (VITAMIN B12) 1000 MCG tablet [id number]  Take 1 tablet (1,000 mcg total) by mouth daily. Lamon Herter M, PA-C  Active   famotidine  (PEPCID ) 20 MG tablet [id number]  TAKE ONE (1) TABLET BY MOUTH TWO (2) TIMES DAILY Hawks, Christy A, FNP  Active   fluorouracil (EFUDEX) 5 % cream [id number]  Apply 1 application topically daily as needed (cancer spots).  [provider]  Active Self  fluticasone  (FLONASE ) 50 MCG/ACT nasal spray [id number]  Place 2 sprays into both nostrils daily. Lavell Lye LABOR, FNP  Active            Med Note PATTRICIA, CRYSTAL L   Thu Aug 22, 2023 11:13 AM) prn  gabapentin  (NEURONTIN ) 100 MG capsule [id number]  Take 1 capsule (100 mg total) by mouth 3 (three) times daily as needed. Lavell Lye A, FNP  Active   hydrocortisone cream 1 % [id number]  Apply 1 Application topically 2 (two) times daily as needed for itching. [provider]  Active Self  hydrOXYzine  (VISTARIL ) 25 MG capsule [id number]  Take 1 capsule (25 mg total) by mouth every 8 (eight) hours as needed. Lavell Lye A, FNP  Active   ketoconazole  (NIZORAL ) 2 % cream [id number]  Apply 1 Application topically 2 (two) times daily. Cathlene Marry Lenis, FNP  Active   levothyroxine  (SYNTHROID ) 112 MCG tablet [id number]  TAKE ONE (1) TABLET BY MOUTH EVERY DAY Hawks, Christy A, FNP  Active   losartan  (COZAAR ) 25 MG tablet [id number]  Take 1  tablet (25 mg total) by mouth daily. Lavell Lye A, FNP  Active   metoprolol  succinate (TOPROL -XL) 25 MG 24 hr tablet [id number]  TAKE ONE (1) TABLET BY MOUTH EVERY DAY Hawks, Christy A, FNP  Active   Multiple Vitamins-Minerals (MULTIVITAMIN WITH MINERALS) tablet [id number]  Take 1 tablet by mouth daily. [provider]  Active Self  mupirocin  ointment (BACTROBAN ) 2 % [id number]  Apply 1 application topically daily as needed (Cancer spots). Sheffield, Kelli R, PA-C  Active Self  ondansetron  (ZOFRAN ) 4 MG tablet [id number]  Take 1 tablet (4 mg total) by mouth every 8 (eight) hours as needed for nausea or vomiting. Lavell Lye A, FNP  Active   pantoprazole  (PROTONIX ) 40 MG tablet [id number]  Take 1 tablet (40 mg total) by mouth daily. Lavell Lye LABOR, FNP  Active   Probiotic Product (PROBIOTIC PO) [id number]  Take 1 capsule by mouth daily. [provider]  Active Self  SUMAtriptan  (IMITREX ) 50 MG tablet [id number]  Take 1 tablet (50 mg total) by mouth every 2 (two) hours as needed for migraine. May repeat in 2 hours if headache persists or recurs. Yolande Lamar BROCKS, MD  Active   ursodiol  (ACTIGALL ) 300 MG capsule [id number]  TAKE 3 CAPSULES BY MOUTH DAILY Castaneda Mayorga, Daniel, MD  Active   vitamin C (ASCORBIC ACID) 250 MG tablet [id number]  Take 250 mg by mouth daily. [provider]  Active Self  Vitamin D , Ergocalciferol , (DRISDOL ) 1.25 MG (50000 UNIT) CAPS capsule [id number]  TAKE 1 CAPSULE BY MOUTH ONCE A WEEK Hawks, Christy A, FNP  Active   vitamin E 180 MG (400 UNITS) capsule [id number]  Take 400 Units by mouth daily. [provider]  Active Self           Med Note KERRIN, MELISSA R   Fri May 25, 2022  1:19 PM)                Assessment/Plan:   Osteoporosis: - Ultimately would like to transition patient to prolia (evenity for  101yr then start prolia), however we are limited by cost -patient is on year 6 of bisphosphonates--will continue for now  and consider holiday after DEXA in 2027 - Reviewed recommendation for daily calcium intake of 1200 mg and vitamin D  intake of [id number] units - Recommended to choose calcium citrate formulation due to concurrent acid reflux medication - Reviewed benefits of weight bearing exercise - Recommend to increase physical activity     Follow Up Plan: repeat dexa in 2027  Fallou Hulbert Dattero Zamire Whitehurst, PharmD, BCACP, CPP Clinical Pharmacist, [facility]

## 2024-08-18 ENCOUNTER — Encounter: Payer: Self-pay | Admitting: Plastic Surgery

## 2024-08-18 ENCOUNTER — Ambulatory Visit: Admitting: Plastic Surgery

## 2024-08-18 VITALS — BP 114/74 | HR 77 | Ht 64.0 in | Wt 152.6 lb

## 2024-08-18 DIAGNOSIS — S0100XD Unspecified open wound of scalp, subsequent encounter: Secondary | ICD-10-CM

## 2024-08-18 DIAGNOSIS — Z923 Personal history of irradiation: Secondary | ICD-10-CM | POA: Diagnosis not present

## 2024-08-18 DIAGNOSIS — S0103XA Puncture wound without foreign body of scalp, initial encounter: Secondary | ICD-10-CM

## 2024-08-18 DIAGNOSIS — Z85828 Personal history of other malignant neoplasm of skin: Secondary | ICD-10-CM | POA: Diagnosis not present

## 2024-08-18 NOTE — Progress Notes (Signed)
 Patient ID: Cassidy Barber, female    DOB: 11-26-1950, 73 y.o.   MRN: [id number]   Chief Complaint  Patient presents with   Follow-up   Skin Problem    The patient is a 73 year old female here for evaluation of her scalp.  The patient was seen a year ago with open area on her scalp in the same place due to excision of skin cancer.  She is on immunosuppressive therapy for a liver transplant.  Her scalp was also radiated so she does not have skin in that area.  She has a history of colon surgery and a history of basal cell carcinoma of the skin as well.  She underwent debridement with myriad placement in July 2024.  She actually healed on her own after that.  She noticed a new wound in the area in the past month or so and on exam about half of it is open with bone exposed.  The open area is in total about 2 x 2 cm with bone exposure of 1 x 1 cm.    Review of Systems  Constitutional: Negative.   Eyes: Negative.   Respiratory: Negative.    Cardiovascular: Negative.   Gastrointestinal: Negative.   Endocrine: Negative.   Musculoskeletal: Negative.   Skin:  Positive for wound.    Past Medical History:  Diagnosis Date   Abdominal wall hernia    Incarcerated status post surgical repair 2019 - [facility]   Anemia of chronic disease    Atypical nevus 01/21/2018   atypical neoplasm- Left scalp-ant (txpbx + MOHS), atypical neoplasm- Left scalp post- (txpbx + MOHS)   Basal cell carcinoma    Colon cancer (HCC)    Colon surgery 2005 and 2012   History of pulmonary hypertension    Pre liver transplant   Hypertension    Hypothyroidism    Lynch syndrome    Osteopenia    Primary biliary cirrhosis (HCC)    Status post liver transplantation - follows at [facility]   SCCA (squamous cell carcinoma) of skin 02/23/2020   Right Upper Chest(moderate) (MOH's)   SCCA (squamous cell carcinoma) of skin 04/07/2020   Left Top Leg (Keratoacanthoma) treatment after biopsy   SCCA (squamous cell carcinoma) of skin  04/07/2020   Left Foot Dorsal (in situ) treatment after biopsy   SCCA (squamous cell carcinoma) of skin 03/27/2021   Right Upper Back (Keratoacanthoma) (excision) (clear)   SCCA (squamous cell carcinoma) of skin 06/13/2021   Right Shoulder - anterior (moderately differentiated) (tx p bx)   SCCA (squamous cell carcinoma) of skin 06/13/2021   Right Thigh - anterior (well differentiated) (tx p bx)   SCCA (squamous cell carcinoma) of skin 06/13/2021   Right Lower Leg - anterior (well differentiated) (tx p bx)   Squamous cell carcinoma of skin 04/22/2018   KA-Right mid chest (txpbx), KA-left elbow crease (txpbx), insitu-Right mid chest inf. (exc)   Squamous cell carcinoma of skin 05/20/2018   well diff-Left upper shin (txpbx), well diff-Right lower forearm (txpbx), well diff-Right upper shin (txpbx)   Squamous cell carcinoma of skin 06/11/2018   Scc + margin-Right mid chest inferior    Squamous cell carcinoma of skin 06/27/2018   well diff-Left mid thigh(txpbx), well diff-Left inner thigh (txpbx),insitu-Right cheek (txpbx),well diff-right inner heel (txpbx)   Squamous cell carcinoma of skin 09/16/2018   well diff-left shoulder (txpbx), well diff-Right chin (txpbx), well diff-right chest lateral (CX35FU)   Squamous cell carcinoma of skin 10/01/2018   Right  outer lower shin (Txpbx)   Squamous cell carcinoma of skin 04/03/2019   well diff-Right center chest (MOHS), in situ-Right ear   Squamous cell carcinoma of skin 08/05/2019   in situ-Left calf (txpbx), in situ-left bicep (txpbx), well diff-Left chest,inf(txpbx), in situ-Right chest inf-(txpbx)   Squamous cell carcinoma of skin 11/19/2019   KA-left top leg (txpbx), modify-Riight forehead-(mohs), in situ-right hand (txpbx), in situ-Right forearm (txpbx), well diff-Right chest (txpbx), well diff-chin (txpbx)   Squamous cell carcinoma of skin 01/06/2020   KA- Left top leg   Squamous cell carcinoma of skin 05/12/2013   bowens-middle of chest  (CX35FU)   Squamous cell carcinoma of skin 05/18/2015   well diff-Left upper arm (CX35FU + Exc),KA-right chest(txpbx), in situ-Left shin (txpbx), well diff-Right cheek (CX35FU), KA-Left post scalp (CX35FU)   Squamous cell carcinoma of skin 08/09/2015   KA-Left post scalp ((MOHS), in situ- mid chest (Txpbx +exc), in situ-Left upper arm inferior (txpbx)   Squamous cell carcinoma of skin 10/13/2015   Left upper arm-clear   Squamous cell carcinoma of skin 03/09/2016   mod diff-mid chest (txpbx+ exc), mod diff-Right chest (txpbx+exc), well diff-right cheek-(txpbx),well diff-Left hand-(txpbx), in situ-Left upper arm (txpbx), well diff-Right cheek -(txpbx), well diff-Right crease arm (txpbx)    Squamous cell carcinoma of skin 05/24/2016   well diff-Right nasal crease-(MOHS)   Squamous cell carcinoma of skin 08/02/2016   KA-Left chest med (txpbx)   Squamous cell carcinoma of skin 08/30/2016   in situ-Left outer zygoma (txpbx)   Squamous cell carcinoma of skin 12/06/2016   well diff-Left chest sup, Left shoudler, insitu- right post scalp   Squamous cell carcinoma of skin 02/14/2017   well diff-Left forearm (EXC),in situ-RIght ant neck   Squamous cell carcinoma of skin 06/14/2017   in situ-Right forearm (txpbx), in situ-Right chest (txpbx), well diff-left chest (txpbx), well diff-anterior neck- (txpbx)   Squamous cell carcinoma of skin 08/07/2017   well diff-Left upper shoulder (txpbx), sup and invasive-Left temple (txpbx), well diff-Right upper shin (txpbx), in situ-Right clavicle (txpbx)   Squamous cell carcinoma of skin 10/17/2017   well diff-ant. neck (MOHS), in situ-Right chest, inf (txpbx)   Squamous cell carcinoma of skin 01/21/2018   well diff- Right chest,ulnar (txpbx), well diff- right upper chest (txpbx), in situ-Right ant. crown (txpbx)   Squamous cell carcinoma of skin 08/02/2020   well diff-left lower leg-inferior (Txpbx)   Squamous cell carcinoma of skin 08/02/2020   well  diff-right lower leg-mid (txpbx)   Squamous cell carcinoma of skin 08/02/2020   well diff-left chest upper   Squamous cell carcinoma of skin 08/02/2020   well diff-mid parietal scalp (MOHS)   Squamous cell carcinoma of skin 08/02/2020   well diff-right foot inner(txpbx)   Squamous cell carcinoma of skin 08/02/2020   well diff- left lower leg medial (txpbx)   Squamous cell carcinoma of skin 08/02/2020   well diff-left lower leg anterior (txpbx)   Squamous cell carcinoma of skin 08/02/2020   well diff-left lower leg medial (txpbx)   Squamous cell carcinoma of skin 08/02/2020   well diff-right forearm-posterior (txpbx)    Past Surgical History:  Procedure Laterality Date   ABDOMINAL HERNIA REPAIR     Patient's states that she has had 8- 9 hernia surgeries   ABDOMINAL HYSTERECTOMY     BIOPSY  02/15/2021   Procedure: BIOPSY;  Surgeon: Golda Claudis PENNER, MD;  [facility];  Service: Endoscopy;;   BIOPSY  12/20/2021   Procedure: BIOPSY;  Surgeon: Golda Claudis PENNER, MD;  [facility];  Service: Endoscopy;;   CATARACT EXTRACTION W/PHACO Right 01/15/2020   Procedure: CATARACT EXTRACTION PHACO AND INTRAOCULAR LENS PLACEMENT (IOC);  Surgeon: Harrie Agent, MD;  Location: [facility];  Service: Ophthalmology;  Laterality: Right;  CDE: 7.89   CATARACT EXTRACTION W/PHACO Left 01/29/2020   Procedure: CATARACT EXTRACTION PHACO AND INTRAOCULAR LENS PLACEMENT (IOC) (CDE: 6.33);  Surgeon: Harrie Agent, MD;  Location: [facility];  Service: Ophthalmology;  Laterality: Left;   CHOLECYSTECTOMY  2007   COLON SURGERY  2008   [facility]   COLONOSCOPY     Done at [facility]: Lambert Rhonda W   COLONOSCOPY N/A 03/17/2024   Procedure: COLONOSCOPY;  Surgeon: Eartha Angelia Sieving, MD;  [facility];  Service: Gastroenterology;  Laterality: N/A;  7:30A,;ASA 3   ESOPHAGOGASTRODUODENOSCOPY N/A 08/21/2018   Procedure: ESOPHAGOGASTRODUODENOSCOPY (EGD);  Surgeon: Golda Claudis PENNER, MD;  [facility];   Service: Endoscopy;  Laterality: N/A;   ESOPHAGOGASTRODUODENOSCOPY N/A 03/17/2024   Procedure: EGD (ESOPHAGOGASTRODUODENOSCOPY);  Surgeon: Eartha Angelia, Sieving, MD;  [facility];  Service: Gastroenterology;  Laterality: N/A;  7:30AM;ASA 3   ESOPHAGOGASTRODUODENOSCOPY (EGD) WITH PROPOFOL  N/A 12/16/2019   Procedure: ESOPHAGOGASTRODUODENOSCOPY (EGD) WITH PROPOFOL ;  Surgeon: Golda Claudis PENNER, MD;  [facility];  Service: Endoscopy;  Laterality: N/A;   ESOPHAGOGASTRODUODENOSCOPY (EGD) WITH PROPOFOL  N/A 12/20/2021   Procedure: ESOPHAGOGASTRODUODENOSCOPY (EGD) WITH PROPOFOL ;  Surgeon: Golda Claudis PENNER, MD;  [facility];  Service: Endoscopy;  Laterality: N/A;  9:05   ESOPHAGOGASTRODUODENOSCOPY (EGD) WITH PROPOFOL  N/A 02/14/2022   Procedure: ESOPHAGOGASTRODUODENOSCOPY (EGD) WITH PROPOFOL ;  Surgeon: Golda Claudis PENNER, MD;  [facility];  Service: Endoscopy;  Laterality: N/A;  730   ESOPHAGOGASTRODUODENOSCOPY (EGD) WITH PROPOFOL  N/A 06/04/2022   Procedure: ESOPHAGOGASTRODUODENOSCOPY (EGD) WITH PROPOFOL ;  Surgeon: Eartha Angelia Sieving, MD;  [facility];  Service: Gastroenterology;  Laterality: N/A;  145   ESOPHAGOGASTRODUODENOSCOPY (EGD) WITH PROPOFOL  N/A 02/26/2023   Procedure: ESOPHAGOGASTRODUODENOSCOPY (EGD) WITH PROPOFOL ;  Surgeon: Eartha Angelia Sieving, MD;  [facility];  Service: Gastroenterology;  Laterality: N/A;  9:15AM;ASA 3   EYE SURGERY     lasix    FLEXIBLE SIGMOIDOSCOPY N/A 10/20/2015   Procedure: FLEXIBLE SIGMOIDOSCOPY;  Surgeon: Claudis PENNER Golda, MD;  [facility];  Service: Endoscopy;  Laterality: N/A;  67 - Dr Golda has meeting until 1:00   FLEXIBLE SIGMOIDOSCOPY N/A 07/11/2016   Procedure: FLEXIBLE SIGMOIDOSCOPY;  Surgeon: Claudis PENNER Golda, MD;  [facility];  Service: Endoscopy;  Laterality: N/A;  1200   FLEXIBLE SIGMOIDOSCOPY N/A 08/09/2017   Procedure: FLEXIBLE SIGMOIDOSCOPY;  Surgeon:  Golda Claudis PENNER, MD;  [facility];  Service: Endoscopy;  Laterality: N/A;  1:00   FLEXIBLE SIGMOIDOSCOPY N/A 08/21/2018   Procedure: FLEXIBLE SIGMOIDOSCOPY;  Surgeon: Golda Claudis PENNER, MD;  [facility];  Service: Endoscopy;  Laterality: N/A;   FLEXIBLE SIGMOIDOSCOPY N/A 12/16/2019   Procedure: FLEXIBLE SIGMOIDOSCOPY wirh Propofol ;  Surgeon: Golda Claudis PENNER, MD;  [facility];  Service: Endoscopy;  Laterality: N/A;  7:30   FLEXIBLE SIGMOIDOSCOPY N/A 02/15/2021   Procedure: FLEXIBLE SIGMOIDOSCOPY WITH PROPOFOL ;  Surgeon: Golda Claudis PENNER, MD;  [facility];  Service: Endoscopy;  Laterality: N/A;  am   FLEXIBLE SIGMOIDOSCOPY N/A 12/20/2021   Procedure: FLEXIBLE SIGMOIDOSCOPY;  Surgeon: Golda Claudis PENNER, MD;  [facility];  Service: Endoscopy;  Laterality: N/A;   FLEXIBLE SIGMOIDOSCOPY N/A 02/26/2023  Procedure: FLEXIBLE SIGMOIDOSCOPY;  Surgeon: Eartha Flavors, Toribio, MD;  [facility];  Service: Gastroenterology;  Laterality: N/A;  9:15AM; ASA 3   FRACTURE SURGERY     right wrist metal plate   GI RADIOFREQUENCY ABLATION N/A 02/14/2022   Procedure: GI RADIOFREQUENCY ABLATION;  Surgeon: Golda Claudis PENNER, MD;  [facility];  Service: Endoscopy;  Laterality: N/A;   HOT HEMOSTASIS N/A 12/20/2021   Procedure: HOT HEMOSTASIS (ARGON PLASMA COAGULATION/BICAP);  Surgeon: Golda Claudis PENNER, MD;  [facility];  Service: Endoscopy;  Laterality: N/A;   HOT HEMOSTASIS  06/04/2022   Procedure: HOT HEMOSTASIS (ARGON PLASMA COAGULATION/BICAP);  Surgeon: Eartha Flavors, Toribio, MD;  [facility];  Service: Gastroenterology;;   HOT HEMOSTASIS  02/26/2023   Procedure: HOT HEMOSTASIS (ARGON PLASMA COAGULATION/BICAP);  Surgeon: Eartha Flavors, Toribio, MD;  [facility];  Service: Gastroenterology;;   IRRIGATION AND DEBRIDEMENT OF WOUND WITH SPLIT THICKNESS SKIN GRAFT N/A 05/13/2023   Procedure: Excision of  scalp wound with Myriad or Acell placement;  Surgeon: Lowery Estefana RAMAN, DO;  Location: [facility];  Service: Plastics;  Laterality: N/A;   LIVER TRANSPLANT  11/19/2013   POLYPECTOMY  08/09/2017   Procedure: POLYPECTOMY;  Surgeon: Golda Claudis PENNER, MD;  [facility];  Service: Endoscopy;;  colon small bowel   POLYPECTOMY N/A 02/14/2022   Procedure: POLYPECTOMY;  Surgeon: Golda Claudis PENNER, MD;  [facility];  Service: Endoscopy;  Laterality: N/A;   REVERSE SHOULDER ARTHROPLASTY Left 07/17/2018   Procedure: LEFT REVERSE SHOULDER ARTHROPLASTY;  Surgeon: Melita Drivers, MD;  Location: [facility];  Service: Orthopedics;  Laterality: Left;    REVERSE SHOULDER ARTHROPLASTY Right 07/20/2021   Procedure: REVERSE SHOULDER ARTHROPLASTY;  Surgeon: Melita Drivers, MD;  Location: [facility];  Service: Orthopedics;  Laterality: Right;   SHOULDER CLOSED REDUCTION Left 09/27/2019   Procedure: CLOSED REDUCTION SHOULDER;  Surgeon: Ernie Cough, MD;  Location: [facility];  Service: Orthopedics;  Laterality: Left;   SPLENECTOMY  2006   TOTAL SHOULDER REVISION Left 11/12/2019   Procedure: Revision Left Reverse Shoulder Arthroplasty with poly exchange SDD;  Surgeon: Melita Drivers, MD;  Location: [facility];  Service: Orthopedics;  Laterality: Left;  -SDDC   TYMPANOSTOMY TUBE PLACEMENT     UPPER GASTROINTESTINAL ENDOSCOPY     Done at [facility]      Current Outpatient Medications:    acetaminophen  (TYLENOL ) 500 MG tablet, Take 1,000 mg by mouth every 6 (six) hours as needed (for pain.). , Disp: , Rfl:    alendronate  (FOSAMAX ) 70 MG tablet, TAKE 1 TABLET BY MOUTH ONCE A WEEK, Disp: 12 tablet, Rfl: 1   ALPRAZolam  (XANAX ) 0.5 MG tablet, Take 1 tablet (0.5 mg total) by mouth at bedtime., Disp: 90 tablet, Rfl: 1   Biotin w/ Vitamins C & E (HAIR SKIN & NAILS GUMMIES PO), Take 2 tablets by mouth daily., Disp: , Rfl:    Calcium Carb-Cholecalciferol  (CALCIUM 600 + D PO), Take 1 tablet by mouth 2  (two) times daily., Disp: , Rfl:    cetirizine  (ZYRTEC ) 10 MG tablet, Take 1 tablet (10 mg total) by mouth daily as needed for allergies., Disp: 90 tablet, Rfl: 3   Cholecalciferol  (VITAMIN D ) 50 MCG (2000 UT) tablet, Take 2,000 Units by mouth daily., Disp: , Rfl:    Clobetasol  Prop Emollient Base (CLOBETASOL  PROPIONATE E) 0.05 % emollient cream, Apply 1 application. topically 2 (two) times daily. (Patient taking differently: Apply 1 application  topically 2 (two) times daily as needed (rash).), Disp: 180 g, Rfl: 1   clotrimazole -betamethasone  (LOTRISONE ) cream, Apply 1 application topically 2 (two) times daily. (Patient taking differently: Apply 1 application  topically daily as needed (Rash).), Disp: 30 g, Rfl: 0   cyanocobalamin  (VITAMIN B12) 1000 MCG tablet, Take 1 tablet (1,000 mcg total) by mouth daily., Disp: 90 tablet, Rfl: 3   famotidine  (PEPCID ) 20 MG tablet, TAKE ONE (1) TABLET BY MOUTH TWO (2) TIMES DAILY, Disp: 60 tablet, Rfl: 5   fluorouracil (EFUDEX) 5 % cream, Apply 1 application topically daily as needed (cancer spots). , Disp: , Rfl: 0   fluticasone  (FLONASE ) 50 MCG/ACT nasal spray, Place 2 sprays into both nostrils daily., Disp: 16 g, Rfl: 6   gabapentin  (NEURONTIN ) 100 MG capsule, Take 1 capsule (100 mg total) by mouth 3 (three) times daily as needed., Disp: 90 capsule, Rfl: 3   hydrocortisone cream 1 %, Apply 1 Application topically 2 (two) times daily as needed for itching., Disp: , Rfl:    hydrOXYzine  (VISTARIL ) 25 MG capsule, Take 1 capsule (25 mg total) by mouth every 8 (eight) hours as needed., Disp: 90 capsule, Rfl: 2   ketoconazole  (NIZORAL ) 2 % cream, Apply 1 Application topically 2 (two) times daily., Disp: 15 g, Rfl: 0   levothyroxine  (SYNTHROID ) 112 MCG tablet, TAKE ONE (1) TABLET BY MOUTH EVERY DAY, Disp: 90 tablet, Rfl: 2   losartan  (COZAAR ) 25 MG tablet, Take 1 tablet (25 mg total) by mouth daily., Disp: 90 tablet, Rfl: 4   metoprolol  succinate (TOPROL -XL) 25 MG 24  hr tablet, TAKE ONE (1) TABLET BY MOUTH EVERY DAY, Disp: 90 tablet, Rfl: 1   Multiple Vitamins-Minerals (MULTIVITAMIN WITH MINERALS) tablet, Take 1 tablet by mouth daily., Disp: , Rfl:    mupirocin  ointment (BACTROBAN ) 2 %, Apply 1 application topically daily as needed (Cancer spots)., Disp: 22 g, Rfl: 3   ondansetron  (ZOFRAN ) 4 MG tablet, Take 1 tablet (4 mg total) by mouth every 8 (eight) hours as needed for nausea or vomiting., Disp: 90 tablet, Rfl: 1   pantoprazole  (PROTONIX ) 40 MG tablet, Take 1 tablet (40 mg total) by mouth daily., Disp: 90 tablet, Rfl: 1   Probiotic Product (PROBIOTIC PO), Take 1 capsule by mouth daily., Disp: , Rfl:    SUMAtriptan  (IMITREX ) 50 MG tablet, Take 1 tablet (50 mg total) by mouth every 2 (two) hours as needed for migraine. May repeat in 2 hours if headache persists or recurs., Disp: 10 tablet, Rfl: 0   ursodiol  (ACTIGALL ) 300 MG capsule, TAKE 3 CAPSULES BY MOUTH DAILY, Disp: 90 capsule, Rfl: 5   vitamin C (ASCORBIC ACID) 250 MG tablet, Take 250 mg by mouth daily., Disp: , Rfl:    Vitamin D , Ergocalciferol , (DRISDOL ) 1.25 MG (50000 UNIT) CAPS capsule, TAKE 1 CAPSULE BY MOUTH ONCE A WEEK, Disp: 12 capsule, Rfl: 1   vitamin E 180 MG (400 UNITS) capsule, Take 400 Units by mouth daily., Disp: , Rfl:    Objective:   Vitals:   08/18/24 1125  BP: 114/74  Pulse: 77  SpO2: 95%    Physical Exam Constitutional:      Appearance: Normal appearance.  HENT:     Head: Normocephalic.  Cardiovascular:     Rate and Rhythm: Normal rate.     Pulses: Normal pulses.  Abdominal:     Palpations: Abdomen is soft.  Skin:    Capillary Refill: Capillary refill takes less than 2 seconds.     Findings:  Bruising, erythema and lesion present.  Neurological:     Mental Status: She is alert and oriented to person, place, and time.  Psychiatric:        Mood and Affect: Mood normal.        Behavior: Behavior normal.        Thought Content: Thought content normal.         Judgment: Judgment normal.     Assessment & Plan:  Puncture wound of scalp without foreign body, initial encounter  Plan for operating room for debridement and placement of myriad of scalp wound.  Pictures were obtained of the patient and placed in the chart with the patient's or guardian's permission.   Estefana RAMAN Rovena Hearld, DO

## 2024-08-20 ENCOUNTER — Other Ambulatory Visit: Payer: Self-pay | Admitting: Family

## 2024-08-20 DIAGNOSIS — D485 Neoplasm of uncertain behavior of skin: Secondary | ICD-10-CM | POA: Diagnosis not present

## 2024-08-20 DIAGNOSIS — L565 Disseminated superficial actinic porokeratosis (DSAP): Secondary | ICD-10-CM | POA: Diagnosis not present

## 2024-08-20 DIAGNOSIS — D0471 Carcinoma in situ of skin of right lower limb, including hip: Secondary | ICD-10-CM | POA: Diagnosis not present

## 2024-08-20 DIAGNOSIS — C44229 Squamous cell carcinoma of skin of left ear and external auricular canal: Secondary | ICD-10-CM | POA: Diagnosis not present

## 2024-08-20 DIAGNOSIS — C44622 Squamous cell carcinoma of skin of right upper limb, including shoulder: Secondary | ICD-10-CM | POA: Diagnosis not present

## 2024-08-20 DIAGNOSIS — B027 Disseminated zoster: Secondary | ICD-10-CM

## 2024-08-20 DIAGNOSIS — C4359 Malignant melanoma of other part of trunk: Secondary | ICD-10-CM | POA: Diagnosis not present

## 2024-08-20 DIAGNOSIS — L570 Actinic keratosis: Secondary | ICD-10-CM | POA: Diagnosis not present

## 2024-08-20 DIAGNOSIS — D492 Neoplasm of unspecified behavior of bone, soft tissue, and skin: Secondary | ICD-10-CM | POA: Diagnosis not present

## 2024-08-24 DIAGNOSIS — Z944 Liver transplant status: Secondary | ICD-10-CM | POA: Diagnosis not present

## 2024-08-24 DIAGNOSIS — D849 Immunodeficiency, unspecified: Secondary | ICD-10-CM | POA: Diagnosis not present

## 2024-08-26 ENCOUNTER — Encounter (INDEPENDENT_AMBULATORY_CARE_PROVIDER_SITE_OTHER): Payer: Self-pay | Admitting: Gastroenterology

## 2024-08-27 DIAGNOSIS — D849 Immunodeficiency, unspecified: Secondary | ICD-10-CM | POA: Diagnosis not present

## 2024-08-27 DIAGNOSIS — Z5181 Encounter for therapeutic drug level monitoring: Secondary | ICD-10-CM | POA: Diagnosis not present

## 2024-08-27 DIAGNOSIS — Z944 Liver transplant status: Secondary | ICD-10-CM | POA: Diagnosis not present

## 2024-08-27 DIAGNOSIS — K743 Primary biliary cirrhosis: Secondary | ICD-10-CM | POA: Diagnosis not present

## 2024-09-03 DIAGNOSIS — D0471 Carcinoma in situ of skin of right lower limb, including hip: Secondary | ICD-10-CM | POA: Diagnosis not present

## 2024-09-03 DIAGNOSIS — D485 Neoplasm of uncertain behavior of skin: Secondary | ICD-10-CM | POA: Diagnosis not present

## 2024-09-03 DIAGNOSIS — L570 Actinic keratosis: Secondary | ICD-10-CM | POA: Diagnosis not present

## 2024-09-03 DIAGNOSIS — D1801 Hemangioma of skin and subcutaneous tissue: Secondary | ICD-10-CM | POA: Diagnosis not present

## 2024-09-03 DIAGNOSIS — L821 Other seborrheic keratosis: Secondary | ICD-10-CM | POA: Diagnosis not present

## 2024-09-03 DIAGNOSIS — C4359 Malignant melanoma of other part of trunk: Secondary | ICD-10-CM | POA: Diagnosis not present

## 2024-09-03 DIAGNOSIS — Z1283 Encounter for screening for malignant neoplasm of skin: Secondary | ICD-10-CM | POA: Diagnosis not present

## 2024-09-03 DIAGNOSIS — L814 Other melanin hyperpigmentation: Secondary | ICD-10-CM | POA: Diagnosis not present

## 2024-09-09 DIAGNOSIS — L089 Local infection of the skin and subcutaneous tissue, unspecified: Secondary | ICD-10-CM | POA: Diagnosis not present

## 2024-09-10 ENCOUNTER — Other Ambulatory Visit: Payer: Self-pay | Admitting: Family

## 2024-09-10 DIAGNOSIS — H6993 Unspecified Eustachian tube disorder, bilateral: Secondary | ICD-10-CM

## 2024-09-11 DIAGNOSIS — N059 Unspecified nephritic syndrome with unspecified morphologic changes: Secondary | ICD-10-CM | POA: Diagnosis not present

## 2024-09-11 DIAGNOSIS — Z944 Liver transplant status: Secondary | ICD-10-CM | POA: Diagnosis not present

## 2024-09-11 DIAGNOSIS — R748 Abnormal levels of other serum enzymes: Secondary | ICD-10-CM | POA: Diagnosis not present

## 2024-09-11 DIAGNOSIS — K759 Inflammatory liver disease, unspecified: Secondary | ICD-10-CM | POA: Diagnosis not present

## 2024-09-11 DIAGNOSIS — R7989 Other specified abnormal findings of blood chemistry: Secondary | ICD-10-CM | POA: Diagnosis not present

## 2024-09-16 ENCOUNTER — Telehealth: Payer: Self-pay | Admitting: Plastic Surgery

## 2024-09-16 DIAGNOSIS — Z79631 Long term (current) use of antimetabolite agent: Secondary | ICD-10-CM | POA: Diagnosis not present

## 2024-09-16 DIAGNOSIS — K838 Other specified diseases of biliary tract: Secondary | ICD-10-CM | POA: Diagnosis not present

## 2024-09-16 DIAGNOSIS — K831 Obstruction of bile duct: Secondary | ICD-10-CM | POA: Diagnosis not present

## 2024-09-16 DIAGNOSIS — Z881 Allergy status to other antibiotic agents status: Secondary | ICD-10-CM | POA: Diagnosis not present

## 2024-09-16 DIAGNOSIS — M810 Age-related osteoporosis without current pathological fracture: Secondary | ICD-10-CM | POA: Diagnosis not present

## 2024-09-16 DIAGNOSIS — Z5181 Encounter for therapeutic drug level monitoring: Secondary | ICD-10-CM | POA: Diagnosis not present

## 2024-09-16 DIAGNOSIS — Z85828 Personal history of other malignant neoplasm of skin: Secondary | ICD-10-CM | POA: Diagnosis not present

## 2024-09-16 DIAGNOSIS — Z79899 Other long term (current) drug therapy: Secondary | ICD-10-CM | POA: Diagnosis not present

## 2024-09-16 DIAGNOSIS — K839 Disease of biliary tract, unspecified: Secondary | ICD-10-CM | POA: Diagnosis not present

## 2024-09-16 DIAGNOSIS — R1012 Left upper quadrant pain: Secondary | ICD-10-CM | POA: Diagnosis not present

## 2024-09-16 DIAGNOSIS — D84821 Immunodeficiency due to drugs: Secondary | ICD-10-CM | POA: Diagnosis not present

## 2024-09-16 DIAGNOSIS — Z79890 Hormone replacement therapy (postmenopausal): Secondary | ICD-10-CM | POA: Diagnosis not present

## 2024-09-16 DIAGNOSIS — Z8619 Personal history of other infectious and parasitic diseases: Secondary | ICD-10-CM | POA: Diagnosis not present

## 2024-09-16 DIAGNOSIS — Z944 Liver transplant status: Secondary | ICD-10-CM | POA: Diagnosis not present

## 2024-09-16 DIAGNOSIS — Z7983 Long term (current) use of bisphosphonates: Secondary | ICD-10-CM | POA: Diagnosis not present

## 2024-09-16 DIAGNOSIS — Z4659 Encounter for fitting and adjustment of other gastrointestinal appliance and device: Secondary | ICD-10-CM | POA: Diagnosis not present

## 2024-09-16 DIAGNOSIS — Z792 Long term (current) use of antibiotics: Secondary | ICD-10-CM | POA: Diagnosis not present

## 2024-09-16 DIAGNOSIS — K219 Gastro-esophageal reflux disease without esophagitis: Secondary | ICD-10-CM | POA: Diagnosis not present

## 2024-09-16 DIAGNOSIS — Z885 Allergy status to narcotic agent status: Secondary | ICD-10-CM | POA: Diagnosis not present

## 2024-09-16 DIAGNOSIS — E039 Hypothyroidism, unspecified: Secondary | ICD-10-CM | POA: Diagnosis not present

## 2024-09-16 DIAGNOSIS — K832 Perforation of bile duct: Secondary | ICD-10-CM | POA: Diagnosis not present

## 2024-09-16 DIAGNOSIS — Z79623 Long term (current) use of mammalian target of rapamycin (mtor) inhibitor: Secondary | ICD-10-CM | POA: Diagnosis not present

## 2024-09-16 DIAGNOSIS — Z15060 Genetic susceptibility to colorectal cancer: Secondary | ICD-10-CM | POA: Diagnosis not present

## 2024-09-16 DIAGNOSIS — Z7401 Bed confinement status: Secondary | ICD-10-CM | POA: Diagnosis not present

## 2024-09-16 DIAGNOSIS — Z85820 Personal history of malignant melanoma of skin: Secondary | ICD-10-CM | POA: Diagnosis not present

## 2024-09-16 DIAGNOSIS — R109 Unspecified abdominal pain: Secondary | ICD-10-CM | POA: Diagnosis not present

## 2024-09-16 DIAGNOSIS — B0230 Zoster ocular disease, unspecified: Secondary | ICD-10-CM | POA: Diagnosis not present

## 2024-09-16 DIAGNOSIS — I10 Essential (primary) hypertension: Secondary | ICD-10-CM | POA: Diagnosis not present

## 2024-09-16 DIAGNOSIS — K9189 Other postprocedural complications and disorders of digestive system: Secondary | ICD-10-CM | POA: Diagnosis not present

## 2024-09-16 DIAGNOSIS — Z85038 Personal history of other malignant neoplasm of large intestine: Secondary | ICD-10-CM | POA: Diagnosis not present

## 2024-09-16 DIAGNOSIS — D849 Immunodeficiency, unspecified: Secondary | ICD-10-CM | POA: Diagnosis not present

## 2024-09-16 DIAGNOSIS — F5104 Psychophysiologic insomnia: Secondary | ICD-10-CM | POA: Diagnosis not present

## 2024-09-16 DIAGNOSIS — Z940 Kidney transplant status: Secondary | ICD-10-CM | POA: Diagnosis not present

## 2024-09-16 DIAGNOSIS — R188 Other ascites: Secondary | ICD-10-CM | POA: Diagnosis not present

## 2024-09-16 DIAGNOSIS — R1013 Epigastric pain: Secondary | ICD-10-CM | POA: Diagnosis not present

## 2024-09-16 DIAGNOSIS — K743 Primary biliary cirrhosis: Secondary | ICD-10-CM | POA: Diagnosis not present

## 2024-09-16 NOTE — Telephone Encounter (Signed)
 Experiencing lots of pain due to a biopsy she had recently

## 2024-09-16 NOTE — Telephone Encounter (Signed)
 Patient needs to RS surgery! :)

## 2024-09-18 ENCOUNTER — Encounter: Admitting: Student

## 2024-09-21 ENCOUNTER — Telehealth: Payer: Self-pay | Admitting: Plastic Surgery

## 2024-09-21 NOTE — Telephone Encounter (Signed)
 Patient is in hospital and I unsure if she can make it to a pre op at this time, and says that she may need to have surgery on her liver, please reach out to her and see if she needs to be reschedule

## 2024-09-24 ENCOUNTER — Telehealth: Payer: Self-pay | Admitting: *Deleted

## 2024-09-24 DIAGNOSIS — Z9181 History of falling: Secondary | ICD-10-CM | POA: Diagnosis not present

## 2024-09-24 DIAGNOSIS — Z1509 Genetic susceptibility to other malignant neoplasm: Secondary | ICD-10-CM | POA: Diagnosis not present

## 2024-09-24 DIAGNOSIS — M8580 Other specified disorders of bone density and structure, unspecified site: Secondary | ICD-10-CM | POA: Diagnosis not present

## 2024-09-24 DIAGNOSIS — Z944 Liver transplant status: Secondary | ICD-10-CM | POA: Diagnosis not present

## 2024-09-24 DIAGNOSIS — Z8719 Personal history of other diseases of the digestive system: Secondary | ICD-10-CM | POA: Diagnosis not present

## 2024-09-24 DIAGNOSIS — Z792 Long term (current) use of antibiotics: Secondary | ICD-10-CM | POA: Diagnosis not present

## 2024-09-24 DIAGNOSIS — K9181 Other intraoperative complications of digestive system: Secondary | ICD-10-CM | POA: Diagnosis not present

## 2024-09-24 DIAGNOSIS — C4492 Squamous cell carcinoma of skin, unspecified: Secondary | ICD-10-CM | POA: Diagnosis not present

## 2024-09-24 DIAGNOSIS — Z79899 Other long term (current) drug therapy: Secondary | ICD-10-CM | POA: Diagnosis not present

## 2024-09-24 DIAGNOSIS — Z9689 Presence of other specified functional implants: Secondary | ICD-10-CM | POA: Diagnosis not present

## 2024-09-24 DIAGNOSIS — D849 Immunodeficiency, unspecified: Secondary | ICD-10-CM | POA: Diagnosis not present

## 2024-09-24 DIAGNOSIS — K219 Gastro-esophageal reflux disease without esophagitis: Secondary | ICD-10-CM | POA: Diagnosis not present

## 2024-09-24 DIAGNOSIS — Z8619 Personal history of other infectious and parasitic diseases: Secondary | ICD-10-CM | POA: Diagnosis not present

## 2024-09-24 DIAGNOSIS — I10 Essential (primary) hypertension: Secondary | ICD-10-CM | POA: Diagnosis not present

## 2024-09-24 DIAGNOSIS — R569 Unspecified convulsions: Secondary | ICD-10-CM | POA: Diagnosis not present

## 2024-09-24 DIAGNOSIS — E039 Hypothyroidism, unspecified: Secondary | ICD-10-CM | POA: Diagnosis not present

## 2024-09-24 DIAGNOSIS — Z85828 Personal history of other malignant neoplasm of skin: Secondary | ICD-10-CM | POA: Diagnosis not present

## 2024-09-24 DIAGNOSIS — Z4803 Encounter for change or removal of drains: Secondary | ICD-10-CM | POA: Diagnosis not present

## 2024-09-24 DIAGNOSIS — M810 Age-related osteoporosis without current pathological fracture: Secondary | ICD-10-CM | POA: Diagnosis not present

## 2024-09-24 DIAGNOSIS — G4709 Other insomnia: Secondary | ICD-10-CM | POA: Diagnosis not present

## 2024-09-24 DIAGNOSIS — E875 Hyperkalemia: Secondary | ICD-10-CM | POA: Diagnosis not present

## 2024-09-24 DIAGNOSIS — Z9049 Acquired absence of other specified parts of digestive tract: Secondary | ICD-10-CM | POA: Diagnosis not present

## 2024-09-24 NOTE — Transitions of Care (Post Inpatient/ED Visit) (Signed)
   09/24/2024  Name: Cassidy Barber MRN: [id number] DOB: [birth year]  Today's TOC FU Call Status: Today's TOC FU Call Status:: Unsuccessful Call (1st Attempt) Unsuccessful Call (1st Attempt) Date: 09/24/24  Attempted to reach the patient regarding the most recent Inpatient/ED visit.  Follow Up Plan: Additional outreach attempts will be made to reach the patient to complete the Transitions of Care (Post Inpatient/ED visit) call.   Mliss Creed [facility], BSN RN Care Manager/ Transition of Care [place]/ [facility] [phone]

## 2024-09-25 ENCOUNTER — Telehealth: Payer: Self-pay | Admitting: *Deleted

## 2024-09-25 NOTE — Transitions of Care (Post Inpatient/ED Visit) (Signed)
 09/25/2024  Name: Cassidy Barber MRN: [id number] DOB: [birth year]  Today's TOC FU Call Status: Today's TOC FU Call Status:: Successful TOC FU Call Completed TOC FU Call Complete Date: 09/25/24  Patient's Name and Date of Birth confirmed. Name, DOB  Transition Care Management Follow-up Telephone Call Date of Discharge: 09/23/24 Discharge Facility: Other [facility]) Name of Other (Non[facility]) Discharge Facility: [facility] Type of Discharge: Inpatient Admission Primary Inpatient Discharge Diagnosis:: Abdominal fluid collection with ERCP- stent- drain placement in setting of prior liver transplant in 2015 How have you been since you were released from the hospital?: Better (I am doing fine; just getting out of the shower; I am independent.  I am followed so closely by the [facility] transplant team-- I don't need any more support than they can offer.  I talk to them sometimes daily, but at least once a week) Any questions or concerns?: No  Items Reviewed: Did you receive and understand the discharge instructions provided?: Yes (briefly reviewed with patient who verbalizes good understanding of same - outside hospital AVS) Medications obtained,verified, and reconciled?: No (Declined medication reconciliation/ review; denies newly Rx'd medications post-hospital discharge; reports self-manages medications and denies questions/ concerns around medications today) Medications Not Reviewed Reasons:: Other: (09/25/24: Patient declined medication review- not near medications; just getting out of the shower now confirms no questions or concerns around meds) Any new allergies since your discharge?: No Dietary orders reviewed?: Yes Type of Diet Ordered:: Regular Do you have support at home?: Yes People in Home [RPT]: spouse Name of Support/Comfort Primary Source: Reports independent in self-care activities; resides with supportive spouse- assists as/ if needed/ indicated  Medications  Reviewed Today: Medications Reviewed Today     Reviewed by Danielys Madry M, RN (Registered Nurse) on 09/25/24 at 1150  Med List Status: <None>   Medication Order Taking? Sig Documenting Provider Last Dose Status Informant  acetaminophen  (TYLENOL ) 500 MG tablet [id number]  Take 1,000 mg by mouth every 6 (six) hours as needed (for pain.).  [provider]  Active Self  alendronate  (FOSAMAX ) 70 MG tablet [id number]  TAKE 1 TABLET BY MOUTH ONCE A WEEK Hawks, Christy A, FNP  Active   ALPRAZolam  (XANAX ) 0.5 MG tablet [id number]  Take 1 tablet (0.5 mg total) by mouth at bedtime. [name] LABOR, FNP  Active   Biotin w/ Vitamins C & E (HAIR SKIN & NAILS GUMMIES PO) [id number]  Take 2 tablets by mouth daily. [provider]  Active Self  Calcium Carb-Cholecalciferol  (CALCIUM 600 + D PO) [id number]  Take 1 tablet by mouth 2 (two) times daily. [provider]  Active Self  cetirizine  (ZYRTEC ) 10 MG tablet [id number]  Take 1 tablet (10 mg total) by mouth daily as needed for allergies. [name] A, FNP  Active Self  Cholecalciferol  (VITAMIN D ) 50 MCG (2000 UT) tablet [id number]  Take 2,000 Units by mouth daily. [provider]  Active Self  Clobetasol  Prop Emollient Base (CLOBETASOL  PROPIONATE E) 0.05 % emollient cream [id number]  Apply 1 application. topically 2 (two) times daily.  Patient taking differently: Apply 1 application  topically 2 (two) times daily as needed (rash).   Sheffield, Kelli R, PA-C  Active Self  clotrimazole -betamethasone  (LOTRISONE ) cream [id number]  Apply 1 application topically 2 (two) times daily.  Patient taking differently: Apply 1 application  topically daily as needed (Rash).   Gladis, Mary-Margaret, FNP  Active Self  cyanocobalamin  (VITAMIN B12) 1000 MCG tablet [id number]  Take 1 tablet (1,000 mcg total) by  mouth daily. Lamon Herter M, PA-C  Active   famotidine  (PEPCID ) 20 MG tablet [id number]  TAKE ONE (1) TABLET BY MOUTH TWO (2) TIMES  DAILY Hawks, Christy A, FNP  Active   fluorouracil (EFUDEX) 5 % cream [id number]  Apply 1 application topically daily as needed (cancer spots).  [provider]  Active Self  fluticasone  (FLONASE ) 50 MCG/ACT nasal spray [id number]  USE 2 SPRAYS IN EACH NOSTRIL DAILY Hawks, Christy A, FNP  Active   gabapentin  (NEURONTIN ) 100 MG capsule [id number]  Take 1 capsule (100 mg total) by mouth 3 (three) times daily as needed. Lavell Lye A, FNP  Active   hydrocortisone cream 1 % [id number]  Apply 1 Application topically 2 (two) times daily as needed for itching. [provider]  Active Self  hydrOXYzine  (VISTARIL ) 25 MG capsule [id number]  TAKE 1 CAPSULE BY MOUTH EVERY 8 HOURS AS NEEDED Hawks, Christy A, FNP  Active   ketoconazole  (NIZORAL ) 2 % cream [id number]  Apply 1 Application topically 2 (two) times daily. Cathlene Marry Lenis, FNP  Active   levothyroxine  (SYNTHROID ) 112 MCG tablet [id number]  TAKE ONE (1) TABLET BY MOUTH EVERY DAY Hawks, Christy A, FNP  Active   losartan  (COZAAR ) 25 MG tablet [id number]  Take 1 tablet (25 mg total) by mouth daily. Lavell Lye A, FNP  Active   metoprolol  succinate (TOPROL -XL) 25 MG 24 hr tablet [id number]  TAKE ONE (1) TABLET BY MOUTH EVERY DAY Hawks, Christy A, FNP  Active   Multiple Vitamins-Minerals (MULTIVITAMIN WITH MINERALS) tablet [id number]  Take 1 tablet by mouth daily. [provider]  Active Self  mupirocin  ointment (BACTROBAN ) 2 % [id number]  Apply 1 application topically daily as needed (Cancer spots). Sheffield, Kelli R, PA-C  Active Self  ondansetron  (ZOFRAN ) 4 MG tablet [id number]  Take 1 tablet (4 mg total) by mouth every 8 (eight) hours as needed for nausea or vomiting. Lavell Lye A, FNP  Active   pantoprazole  (PROTONIX ) 40 MG tablet [id number]  Take 1 tablet (40 mg total) by mouth daily. Lavell Lye LABOR, FNP  Active   Probiotic Product (PROBIOTIC PO) [id number]  Take 1 capsule by mouth daily. [provider]  Active  Self  SUMAtriptan  (IMITREX ) 50 MG tablet [id number]  Take 1 tablet (50 mg total) by mouth every 2 (two) hours as needed for migraine. May repeat in 2 hours if headache persists or recurs. Yolande Lamar BROCKS, MD  Active   ursodiol  (ACTIGALL ) 300 MG capsule [id number]  TAKE 3 CAPSULES BY MOUTH DAILY Castaneda Mayorga, Daniel, MD  Active   vitamin C (ASCORBIC ACID) 250 MG tablet [id number]  Take 250 mg by mouth daily. [provider]  Active Self  Vitamin D , Ergocalciferol , (DRISDOL ) 1.25 MG (50000 UNIT) CAPS capsule [id number]  TAKE 1 CAPSULE BY MOUTH ONCE A WEEK Hawks, Christy A, FNP  Active   vitamin E 180 MG (400 UNITS) capsule [id number]  Take 400 Units by mouth daily. [provider]  Active Self           Med Note KERRIN, MELISSA R   Fri May 25, 2022  1:19 PM)             Home Care and Equipment/Supplies: Were Home Health Services Ordered?: Yes Name of Home Health Agency:: [facility]: RN and PT Has Agency set up a time to come to your home?: Yes First Home Health Visit Date: 09/24/24 Any new equipment or medical supplies ordered?: No  Functional Questionnaire: Do you need  assistance with bathing/showering or dressing?: No (family supervising as indicated post-hospital discharge; independent at baseline) Do you need assistance with meal preparation?: No Do you need assistance with eating?: No Do you have difficulty maintaining continence: No Do you need assistance with getting out of bed/getting out of a chair/moving?: No Do you have difficulty managing or taking your medications?: No  Follow up appointments reviewed: PCP Follow-up appointment confirmed?: Yes Date of PCP follow-up appointment?: 10/01/24 Follow-up Provider: PCP- Bari Lavell Driscilla Lionel Follow-up appointment confirmed?: Yes Date of Specialist follow-up appointment?: 10/12/24 Follow-Up Specialty Provider:: Transplant team- [facility] Do you need transportation to your follow-up appointment?: No Do  you understand care options if your condition(s) worsen?: Yes-patient verbalized understanding  SDOH Interventions Today    Flowsheet Row Most Recent Value  SDOH Interventions   Food Insecurity Interventions Intervention Not Indicated  Housing Interventions Intervention Not Indicated  Transportation Interventions Intervention Not Indicated  [drives self at baseline: family assisting as indicated post-hospital discharge 09/23/24]  Utilities Interventions Intervention Not Indicated   See TOC assessment tabs for additional assessment/ TOC intervention information  Patient declines need for ongoing/ further care management/ coordination outreach; declines enrollment in 30-day TOC program- declines taking my direct phone number should needs/ concerns arise post-TOC call   Pls call/ message for questions,  Rolly Magri Mckinney Karyme Mcconathy, RN, BSN, CCRN [employer] RN Care Manager  Transitions of Care  [facility] - [facility] [phone]: direct office

## 2024-09-29 ENCOUNTER — Ambulatory Visit: Payer: Self-pay

## 2024-09-29 DIAGNOSIS — R1012 Left upper quadrant pain: Secondary | ICD-10-CM | POA: Diagnosis not present

## 2024-09-29 DIAGNOSIS — Z15060 Genetic susceptibility to colorectal cancer: Secondary | ICD-10-CM | POA: Diagnosis not present

## 2024-09-29 DIAGNOSIS — E039 Hypothyroidism, unspecified: Secondary | ICD-10-CM | POA: Diagnosis not present

## 2024-09-29 DIAGNOSIS — K651 Peritoneal abscess: Secondary | ICD-10-CM | POA: Diagnosis not present

## 2024-09-29 DIAGNOSIS — R109 Unspecified abdominal pain: Secondary | ICD-10-CM | POA: Diagnosis not present

## 2024-09-29 DIAGNOSIS — K832 Perforation of bile duct: Secondary | ICD-10-CM | POA: Diagnosis not present

## 2024-09-29 DIAGNOSIS — Z9181 History of falling: Secondary | ICD-10-CM | POA: Diagnosis not present

## 2024-09-29 DIAGNOSIS — D84821 Immunodeficiency due to drugs: Secondary | ICD-10-CM | POA: Diagnosis not present

## 2024-09-29 DIAGNOSIS — Z1509 Genetic susceptibility to other malignant neoplasm: Secondary | ICD-10-CM | POA: Diagnosis not present

## 2024-09-29 DIAGNOSIS — Z8619 Personal history of other infectious and parasitic diseases: Secondary | ICD-10-CM | POA: Diagnosis not present

## 2024-09-29 DIAGNOSIS — K219 Gastro-esophageal reflux disease without esophagitis: Secondary | ICD-10-CM | POA: Diagnosis not present

## 2024-09-29 DIAGNOSIS — Z8719 Personal history of other diseases of the digestive system: Secondary | ICD-10-CM | POA: Diagnosis not present

## 2024-09-29 DIAGNOSIS — Z881 Allergy status to other antibiotic agents status: Secondary | ICD-10-CM | POA: Diagnosis not present

## 2024-09-29 DIAGNOSIS — K9189 Other postprocedural complications and disorders of digestive system: Secondary | ICD-10-CM | POA: Diagnosis not present

## 2024-09-29 DIAGNOSIS — T8611 Kidney transplant rejection: Secondary | ICD-10-CM | POA: Diagnosis not present

## 2024-09-29 DIAGNOSIS — Z85820 Personal history of malignant melanoma of skin: Secondary | ICD-10-CM | POA: Diagnosis not present

## 2024-09-29 DIAGNOSIS — Z90710 Acquired absence of both cervix and uterus: Secondary | ICD-10-CM | POA: Diagnosis not present

## 2024-09-29 DIAGNOSIS — R188 Other ascites: Secondary | ICD-10-CM | POA: Diagnosis not present

## 2024-09-29 DIAGNOSIS — B0230 Zoster ocular disease, unspecified: Secondary | ICD-10-CM | POA: Diagnosis not present

## 2024-09-29 DIAGNOSIS — Z85038 Personal history of other malignant neoplasm of large intestine: Secondary | ICD-10-CM | POA: Diagnosis not present

## 2024-09-29 DIAGNOSIS — E875 Hyperkalemia: Secondary | ICD-10-CM | POA: Diagnosis not present

## 2024-09-29 DIAGNOSIS — L0390 Cellulitis, unspecified: Secondary | ICD-10-CM | POA: Diagnosis not present

## 2024-09-29 DIAGNOSIS — N179 Acute kidney failure, unspecified: Secondary | ICD-10-CM | POA: Diagnosis not present

## 2024-09-29 DIAGNOSIS — F419 Anxiety disorder, unspecified: Secondary | ICD-10-CM | POA: Diagnosis not present

## 2024-09-29 DIAGNOSIS — T8143XA Infection following a procedure, organ and space surgical site, initial encounter: Secondary | ICD-10-CM | POA: Diagnosis not present

## 2024-09-29 DIAGNOSIS — Z79623 Long term (current) use of mammalian target of rapamycin (mtor) inhibitor: Secondary | ICD-10-CM | POA: Diagnosis not present

## 2024-09-29 DIAGNOSIS — Z792 Long term (current) use of antibiotics: Secondary | ICD-10-CM | POA: Diagnosis not present

## 2024-09-29 DIAGNOSIS — K9181 Other intraoperative complications of digestive system: Secondary | ICD-10-CM | POA: Diagnosis not present

## 2024-09-29 DIAGNOSIS — Z4803 Encounter for change or removal of drains: Secondary | ICD-10-CM | POA: Diagnosis not present

## 2024-09-29 DIAGNOSIS — Z9689 Presence of other specified functional implants: Secondary | ICD-10-CM | POA: Diagnosis not present

## 2024-09-29 DIAGNOSIS — Z79631 Long term (current) use of antimetabolite agent: Secondary | ICD-10-CM | POA: Diagnosis not present

## 2024-09-29 DIAGNOSIS — R222 Localized swelling, mass and lump, trunk: Secondary | ICD-10-CM | POA: Diagnosis not present

## 2024-09-29 DIAGNOSIS — L03311 Cellulitis of abdominal wall: Secondary | ICD-10-CM | POA: Diagnosis not present

## 2024-09-29 DIAGNOSIS — Z9049 Acquired absence of other specified parts of digestive tract: Secondary | ICD-10-CM | POA: Diagnosis not present

## 2024-09-29 DIAGNOSIS — Z79899 Other long term (current) drug therapy: Secondary | ICD-10-CM | POA: Diagnosis not present

## 2024-09-29 DIAGNOSIS — Z944 Liver transplant status: Secondary | ICD-10-CM | POA: Diagnosis not present

## 2024-09-29 DIAGNOSIS — M810 Age-related osteoporosis without current pathological fracture: Secondary | ICD-10-CM | POA: Diagnosis not present

## 2024-09-29 DIAGNOSIS — K838 Other specified diseases of biliary tract: Secondary | ICD-10-CM | POA: Diagnosis not present

## 2024-09-29 DIAGNOSIS — D849 Immunodeficiency, unspecified: Secondary | ICD-10-CM | POA: Diagnosis not present

## 2024-09-29 DIAGNOSIS — M8580 Other specified disorders of bone density and structure, unspecified site: Secondary | ICD-10-CM | POA: Diagnosis not present

## 2024-09-29 DIAGNOSIS — I10 Essential (primary) hypertension: Secondary | ICD-10-CM | POA: Diagnosis not present

## 2024-09-29 DIAGNOSIS — R569 Unspecified convulsions: Secondary | ICD-10-CM | POA: Diagnosis not present

## 2024-09-29 DIAGNOSIS — Z7983 Long term (current) use of bisphosphonates: Secondary | ICD-10-CM | POA: Diagnosis not present

## 2024-09-29 DIAGNOSIS — C4492 Squamous cell carcinoma of skin, unspecified: Secondary | ICD-10-CM | POA: Diagnosis not present

## 2024-09-29 DIAGNOSIS — Z85828 Personal history of other malignant neoplasm of skin: Secondary | ICD-10-CM | POA: Diagnosis not present

## 2024-09-29 DIAGNOSIS — Z885 Allergy status to narcotic agent status: Secondary | ICD-10-CM | POA: Diagnosis not present

## 2024-09-29 DIAGNOSIS — Z79890 Hormone replacement therapy (postmenopausal): Secondary | ICD-10-CM | POA: Diagnosis not present

## 2024-09-29 DIAGNOSIS — G4709 Other insomnia: Secondary | ICD-10-CM | POA: Diagnosis not present

## 2024-09-29 NOTE — Telephone Encounter (Signed)
 FYI Only or Action Required?: Action required by provider: recommended ED but unsure of.  Patient was last seen in primary care on 05/08/2024 by Cassidy Barber LABOR, FNP.  Called Nurse Triage reporting Abdominal Pain.  Symptoms began several days ago.  Interventions attempted: Prescription medications: See MAR and Rest, hydration, or home remedies.  Symptoms are: unchanged.  Triage Disposition: Go to ED Now (Notify PCP)  Patient/caregiver understands and will follow disposition?: Unsure  Copied from CRM [id number]. Topic: Clinical - Red Word Triage >> Sep 29, 2024  8:39 AM Cassidy Barber wrote: Red Word that prompted transfer to Nurse Triage: Cassidy Barber, a nurse with Cassidy Barber, called regarding the patient who was recently admitted to [facility] Barber for a bowel leak. The JP drain is not draining, and the patient is experiencing abdominal pain.The left side of the abdomen is hard Reason for Disposition  [1] SEVERE pain (e.g., excruciating) AND [2] present > 1 hour  Answer Assessment - Initial Assessment Questions Home Health RN for patient with a JP drain to the right side of abdomen-JP drain isn't draining anymore-flushes well but no drainage. Patient with increased left abdominal pain with swelling and warmth. Left side is discolored per the home health RN. Patient's abdominal pain not being managed by dilaudid  and tylenol  which started over the weekend. Left abdominal area is hard to the touch. Home Health RN trying to get in touch with liver clinic-was able to find a phone number for liver clinic for Cassidy Barber. Recommended patient to be seen in the ED due to symptoms. Home Health RN to call liver clinic.   1. LOCATION: Where does it hurt?      Left abdominal area 2. RADIATION: Does the pain shoot anywhere else? (e.g., chest, back)     no 3. ONSET: When did the pain begin? (e.g., minutes, hours or days ago)      Over the weekend-increased pain that isn't able to be controlled with  pain medication.  4. SUDDEN: Gradual or sudden onset?     gradual 5. PATTERN Does the pain come and go, or is it constant?     constant 6. SEVERITY: How bad is the pain?  (e.g., Scale 1-10; mild, moderate, or severe)     8 out of 10 7. RECURRENT SYMPTOM: Have you ever had this type of stomach pain before? If Yes, ask: When was the last time? and What happened that time?      yes 8. AGGRAVATING FACTORS: Does anything seem to cause this pain? (e.g., foods, stress, alcohol)     no 9. CARDIAC SYMPTOMS: Do you have any of the following symptoms: chest pain, difficulty breathing, sweating, nausea?     no 10. OTHER SYMPTOMS: Do you have any other symptoms? (e.g., back pain, diarrhea, fever, urination pain, vomiting)       Fever on Saturday  Protocols used: Abdominal Pain - Upper-A-AH

## 2024-10-01 ENCOUNTER — Ambulatory Visit

## 2024-10-01 ENCOUNTER — Inpatient Hospital Stay: Admitting: Family

## 2024-10-01 DIAGNOSIS — I10 Essential (primary) hypertension: Secondary | ICD-10-CM | POA: Diagnosis not present

## 2024-10-01 DIAGNOSIS — K219 Gastro-esophageal reflux disease without esophagitis: Secondary | ICD-10-CM

## 2024-10-01 DIAGNOSIS — Z4803 Encounter for change or removal of drains: Secondary | ICD-10-CM

## 2024-10-01 DIAGNOSIS — K9181 Other intraoperative complications of digestive system: Secondary | ICD-10-CM | POA: Diagnosis not present

## 2024-10-01 DIAGNOSIS — C4492 Squamous cell carcinoma of skin, unspecified: Secondary | ICD-10-CM

## 2024-10-01 DIAGNOSIS — R569 Unspecified convulsions: Secondary | ICD-10-CM

## 2024-10-01 DIAGNOSIS — D849 Immunodeficiency, unspecified: Secondary | ICD-10-CM

## 2024-10-01 DIAGNOSIS — M810 Age-related osteoporosis without current pathological fracture: Secondary | ICD-10-CM

## 2024-10-01 DIAGNOSIS — E875 Hyperkalemia: Secondary | ICD-10-CM

## 2024-10-01 DIAGNOSIS — G4709 Other insomnia: Secondary | ICD-10-CM

## 2024-10-01 DIAGNOSIS — E039 Hypothyroidism, unspecified: Secondary | ICD-10-CM

## 2024-10-01 DIAGNOSIS — M8580 Other specified disorders of bone density and structure, unspecified site: Secondary | ICD-10-CM

## 2024-10-06 ENCOUNTER — Encounter: Admitting: Student

## 2024-10-07 ENCOUNTER — Telehealth: Payer: Self-pay | Admitting: *Deleted

## 2024-10-07 ENCOUNTER — Telehealth: Payer: Self-pay

## 2024-10-07 NOTE — Telephone Encounter (Signed)
 Copied from CRM [id number]. Topic: Clinical - Home Health Verbal Orders >> Oct 07, 2024 10:33 AM Rosaria BRAVO wrote: Caller/Agency: Christy from [facility]  Will be resuming care on Saturday due to the holiday.  Best contact: [phone]

## 2024-10-07 NOTE — Transitions of Care (Post Inpatient/ED Visit) (Signed)
   10/07/2024  Name: Cassidy Barber MRN: [id number] DOB: May 24, 1951  Today's TOC FU Call Status: Today's TOC FU Call Status:: Unsuccessful Call (1st Attempt) Unsuccessful Call (1st Attempt) Date: 10/07/24  Attempted to reach the patient regarding the most recent Inpatient/ED visit.  Follow Up Plan: Additional outreach attempts will be made to reach the patient to complete the Transitions of Care (Post Inpatient/ED visit) call.   Mliss Creed [facility], BSN RN Care Manager/ Transition of Care [place]/ [facility] [phone]

## 2024-10-09 ENCOUNTER — Encounter: Admitting: Plastic Surgery

## 2024-10-10 DIAGNOSIS — Z79899 Other long term (current) drug therapy: Secondary | ICD-10-CM | POA: Diagnosis not present

## 2024-10-10 DIAGNOSIS — Z9181 History of falling: Secondary | ICD-10-CM | POA: Diagnosis not present

## 2024-10-10 DIAGNOSIS — Z9689 Presence of other specified functional implants: Secondary | ICD-10-CM | POA: Diagnosis not present

## 2024-10-10 DIAGNOSIS — C4492 Squamous cell carcinoma of skin, unspecified: Secondary | ICD-10-CM | POA: Diagnosis not present

## 2024-10-10 DIAGNOSIS — Z944 Liver transplant status: Secondary | ICD-10-CM | POA: Diagnosis not present

## 2024-10-10 DIAGNOSIS — Z8619 Personal history of other infectious and parasitic diseases: Secondary | ICD-10-CM | POA: Diagnosis not present

## 2024-10-10 DIAGNOSIS — Z4803 Encounter for change or removal of drains: Secondary | ICD-10-CM | POA: Diagnosis not present

## 2024-10-10 DIAGNOSIS — M810 Age-related osteoporosis without current pathological fracture: Secondary | ICD-10-CM | POA: Diagnosis not present

## 2024-10-10 DIAGNOSIS — E039 Hypothyroidism, unspecified: Secondary | ICD-10-CM | POA: Diagnosis not present

## 2024-10-10 DIAGNOSIS — Z9049 Acquired absence of other specified parts of digestive tract: Secondary | ICD-10-CM | POA: Diagnosis not present

## 2024-10-10 DIAGNOSIS — Z8719 Personal history of other diseases of the digestive system: Secondary | ICD-10-CM | POA: Diagnosis not present

## 2024-10-10 DIAGNOSIS — Z792 Long term (current) use of antibiotics: Secondary | ICD-10-CM | POA: Diagnosis not present

## 2024-10-10 DIAGNOSIS — Z85828 Personal history of other malignant neoplasm of skin: Secondary | ICD-10-CM | POA: Diagnosis not present

## 2024-10-10 DIAGNOSIS — R569 Unspecified convulsions: Secondary | ICD-10-CM | POA: Diagnosis not present

## 2024-10-10 DIAGNOSIS — E875 Hyperkalemia: Secondary | ICD-10-CM | POA: Diagnosis not present

## 2024-10-10 DIAGNOSIS — Z1509 Genetic susceptibility to other malignant neoplasm: Secondary | ICD-10-CM | POA: Diagnosis not present

## 2024-10-10 DIAGNOSIS — M8580 Other specified disorders of bone density and structure, unspecified site: Secondary | ICD-10-CM | POA: Diagnosis not present

## 2024-10-10 DIAGNOSIS — I10 Essential (primary) hypertension: Secondary | ICD-10-CM | POA: Diagnosis not present

## 2024-10-10 DIAGNOSIS — K9181 Other intraoperative complications of digestive system: Secondary | ICD-10-CM | POA: Diagnosis not present

## 2024-10-10 DIAGNOSIS — D849 Immunodeficiency, unspecified: Secondary | ICD-10-CM | POA: Diagnosis not present

## 2024-10-10 DIAGNOSIS — G4709 Other insomnia: Secondary | ICD-10-CM | POA: Diagnosis not present

## 2024-10-10 DIAGNOSIS — K219 Gastro-esophageal reflux disease without esophagitis: Secondary | ICD-10-CM | POA: Diagnosis not present

## 2024-10-12 ENCOUNTER — Telehealth: Payer: Self-pay

## 2024-10-12 DIAGNOSIS — R748 Abnormal levels of other serum enzymes: Secondary | ICD-10-CM | POA: Diagnosis not present

## 2024-10-12 DIAGNOSIS — D849 Immunodeficiency, unspecified: Secondary | ICD-10-CM | POA: Diagnosis not present

## 2024-10-12 DIAGNOSIS — Z944 Liver transplant status: Secondary | ICD-10-CM | POA: Diagnosis not present

## 2024-10-12 DIAGNOSIS — Z5181 Encounter for therapeutic drug level monitoring: Secondary | ICD-10-CM | POA: Diagnosis not present

## 2024-10-12 NOTE — Transitions of Care (Post Inpatient/ED Visit) (Signed)
   10/12/2024  Name: [name] MRN: [id number] DOB: 12/04/1950  Today's TOC FU Call Status: Today's TOC FU Call Status:: Unsuccessful Call (2nd Attempt) Unsuccessful Call (2nd Attempt) Date: 10/12/24  Attempted to reach the patient regarding the most recent Inpatient/ED visit.  Follow Up Plan: Additional outreach attempts will be made to reach the patient to complete the Transitions of Care (Post Inpatient/ED visit) call.   Alan Ee, RN, BSN, CEN [employer]- Transition of Care Team.  [facility] [phone]

## 2024-10-13 DIAGNOSIS — D849 Immunodeficiency, unspecified: Secondary | ICD-10-CM | POA: Diagnosis not present

## 2024-10-13 DIAGNOSIS — Z944 Liver transplant status: Secondary | ICD-10-CM | POA: Diagnosis not present

## 2024-10-14 DIAGNOSIS — I10 Essential (primary) hypertension: Secondary | ICD-10-CM | POA: Diagnosis not present

## 2024-10-14 DIAGNOSIS — K219 Gastro-esophageal reflux disease without esophagitis: Secondary | ICD-10-CM | POA: Diagnosis not present

## 2024-10-14 DIAGNOSIS — M8580 Other specified disorders of bone density and structure, unspecified site: Secondary | ICD-10-CM | POA: Diagnosis not present

## 2024-10-14 DIAGNOSIS — Z792 Long term (current) use of antibiotics: Secondary | ICD-10-CM | POA: Diagnosis not present

## 2024-10-14 DIAGNOSIS — Z79899 Other long term (current) drug therapy: Secondary | ICD-10-CM | POA: Diagnosis not present

## 2024-10-14 DIAGNOSIS — Z85828 Personal history of other malignant neoplasm of skin: Secondary | ICD-10-CM | POA: Diagnosis not present

## 2024-10-14 DIAGNOSIS — R569 Unspecified convulsions: Secondary | ICD-10-CM | POA: Diagnosis not present

## 2024-10-14 DIAGNOSIS — Z944 Liver transplant status: Secondary | ICD-10-CM | POA: Diagnosis not present

## 2024-10-14 DIAGNOSIS — Z4803 Encounter for change or removal of drains: Secondary | ICD-10-CM | POA: Diagnosis not present

## 2024-10-14 DIAGNOSIS — Z1509 Genetic susceptibility to other malignant neoplasm: Secondary | ICD-10-CM | POA: Diagnosis not present

## 2024-10-14 DIAGNOSIS — C4492 Squamous cell carcinoma of skin, unspecified: Secondary | ICD-10-CM | POA: Diagnosis not present

## 2024-10-14 DIAGNOSIS — K9181 Other intraoperative complications of digestive system: Secondary | ICD-10-CM | POA: Diagnosis not present

## 2024-10-14 DIAGNOSIS — E039 Hypothyroidism, unspecified: Secondary | ICD-10-CM | POA: Diagnosis not present

## 2024-10-14 DIAGNOSIS — Z9689 Presence of other specified functional implants: Secondary | ICD-10-CM | POA: Diagnosis not present

## 2024-10-14 DIAGNOSIS — Z9049 Acquired absence of other specified parts of digestive tract: Secondary | ICD-10-CM | POA: Diagnosis not present

## 2024-10-14 DIAGNOSIS — E875 Hyperkalemia: Secondary | ICD-10-CM | POA: Diagnosis not present

## 2024-10-14 DIAGNOSIS — D849 Immunodeficiency, unspecified: Secondary | ICD-10-CM | POA: Diagnosis not present

## 2024-10-14 DIAGNOSIS — M810 Age-related osteoporosis without current pathological fracture: Secondary | ICD-10-CM | POA: Diagnosis not present

## 2024-10-14 DIAGNOSIS — Z8619 Personal history of other infectious and parasitic diseases: Secondary | ICD-10-CM | POA: Diagnosis not present

## 2024-10-14 DIAGNOSIS — G4709 Other insomnia: Secondary | ICD-10-CM | POA: Diagnosis not present

## 2024-10-14 DIAGNOSIS — Z9181 History of falling: Secondary | ICD-10-CM | POA: Diagnosis not present

## 2024-10-14 DIAGNOSIS — Z8719 Personal history of other diseases of the digestive system: Secondary | ICD-10-CM | POA: Diagnosis not present

## 2024-10-15 ENCOUNTER — Other Ambulatory Visit: Payer: Self-pay | Admitting: Family

## 2024-10-15 DIAGNOSIS — I10 Essential (primary) hypertension: Secondary | ICD-10-CM

## 2024-10-16 ENCOUNTER — Telehealth: Payer: Self-pay

## 2024-10-16 DIAGNOSIS — Z944 Liver transplant status: Secondary | ICD-10-CM | POA: Diagnosis not present

## 2024-10-16 DIAGNOSIS — K838 Other specified diseases of biliary tract: Secondary | ICD-10-CM | POA: Diagnosis not present

## 2024-10-16 DIAGNOSIS — K9189 Other postprocedural complications and disorders of digestive system: Secondary | ICD-10-CM | POA: Diagnosis not present

## 2024-10-16 NOTE — Telephone Encounter (Signed)
 Copied from CRM #[id number]. Topic: Clinical - Medication Question >> Oct 16, 2024  9:46 AM Sophia H wrote: Reason for CRM: Spoke with [employer] Pharmacist who states the patient is unsure if she should be taking losartan  (COZAAR ) 25 MG tablet - was in hospital for a little while and was never told if she should continue or stop. Advised I did not see that hospital stay in the patients chart, please reach out to patient for clarification and then pharmacy so they know if should keep med on file or not [phone] [employer].

## 2024-10-16 NOTE — Telephone Encounter (Signed)
 Patient has not been taking losartan  for 2 months- blood pressure has been good. Told to keep diary of blood pressure at home and hold meds until sees PCP at end of December. Bring diary  of home blood pressures to appointment

## 2024-10-19 DIAGNOSIS — Z944 Liver transplant status: Secondary | ICD-10-CM | POA: Diagnosis not present

## 2024-10-19 DIAGNOSIS — D849 Immunodeficiency, unspecified: Secondary | ICD-10-CM | POA: Diagnosis not present

## 2024-10-20 DIAGNOSIS — Z8619 Personal history of other infectious and parasitic diseases: Secondary | ICD-10-CM | POA: Diagnosis not present

## 2024-10-20 DIAGNOSIS — K9181 Other intraoperative complications of digestive system: Secondary | ICD-10-CM | POA: Diagnosis not present

## 2024-10-20 DIAGNOSIS — G4709 Other insomnia: Secondary | ICD-10-CM | POA: Diagnosis not present

## 2024-10-20 DIAGNOSIS — Z9689 Presence of other specified functional implants: Secondary | ICD-10-CM | POA: Diagnosis not present

## 2024-10-20 DIAGNOSIS — Z85828 Personal history of other malignant neoplasm of skin: Secondary | ICD-10-CM | POA: Diagnosis not present

## 2024-10-20 DIAGNOSIS — C4492 Squamous cell carcinoma of skin, unspecified: Secondary | ICD-10-CM | POA: Diagnosis not present

## 2024-10-20 DIAGNOSIS — Z8719 Personal history of other diseases of the digestive system: Secondary | ICD-10-CM | POA: Diagnosis not present

## 2024-10-20 DIAGNOSIS — Z944 Liver transplant status: Secondary | ICD-10-CM | POA: Diagnosis not present

## 2024-10-20 DIAGNOSIS — Z79899 Other long term (current) drug therapy: Secondary | ICD-10-CM | POA: Diagnosis not present

## 2024-10-20 DIAGNOSIS — E875 Hyperkalemia: Secondary | ICD-10-CM | POA: Diagnosis not present

## 2024-10-20 DIAGNOSIS — I10 Essential (primary) hypertension: Secondary | ICD-10-CM | POA: Diagnosis not present

## 2024-10-20 DIAGNOSIS — E039 Hypothyroidism, unspecified: Secondary | ICD-10-CM | POA: Diagnosis not present

## 2024-10-20 DIAGNOSIS — Z9181 History of falling: Secondary | ICD-10-CM | POA: Diagnosis not present

## 2024-10-20 DIAGNOSIS — Z4803 Encounter for change or removal of drains: Secondary | ICD-10-CM | POA: Diagnosis not present

## 2024-10-20 DIAGNOSIS — Z1509 Genetic susceptibility to other malignant neoplasm: Secondary | ICD-10-CM | POA: Diagnosis not present

## 2024-10-20 DIAGNOSIS — M810 Age-related osteoporosis without current pathological fracture: Secondary | ICD-10-CM | POA: Diagnosis not present

## 2024-10-20 DIAGNOSIS — Z9049 Acquired absence of other specified parts of digestive tract: Secondary | ICD-10-CM | POA: Diagnosis not present

## 2024-10-20 DIAGNOSIS — Z792 Long term (current) use of antibiotics: Secondary | ICD-10-CM | POA: Diagnosis not present

## 2024-10-20 DIAGNOSIS — K219 Gastro-esophageal reflux disease without esophagitis: Secondary | ICD-10-CM | POA: Diagnosis not present

## 2024-10-20 DIAGNOSIS — M8580 Other specified disorders of bone density and structure, unspecified site: Secondary | ICD-10-CM | POA: Diagnosis not present

## 2024-10-20 DIAGNOSIS — R569 Unspecified convulsions: Secondary | ICD-10-CM | POA: Diagnosis not present

## 2024-10-20 DIAGNOSIS — D849 Immunodeficiency, unspecified: Secondary | ICD-10-CM | POA: Diagnosis not present

## 2024-10-21 DIAGNOSIS — L0390 Cellulitis, unspecified: Secondary | ICD-10-CM | POA: Diagnosis not present

## 2024-10-21 DIAGNOSIS — K9189 Other postprocedural complications and disorders of digestive system: Secondary | ICD-10-CM | POA: Diagnosis not present

## 2024-10-21 DIAGNOSIS — Z944 Liver transplant status: Secondary | ICD-10-CM | POA: Diagnosis not present

## 2024-10-21 DIAGNOSIS — K838 Other specified diseases of biliary tract: Secondary | ICD-10-CM | POA: Diagnosis not present

## 2024-10-23 ENCOUNTER — Encounter: Admitting: Student

## 2024-10-29 ENCOUNTER — Ambulatory Visit (HOSPITAL_BASED_OUTPATIENT_CLINIC_OR_DEPARTMENT_OTHER): Admit: 2024-10-29 | Admitting: Plastic Surgery

## 2024-10-29 ENCOUNTER — Encounter (HOSPITAL_BASED_OUTPATIENT_CLINIC_OR_DEPARTMENT_OTHER): Payer: Self-pay

## 2024-10-29 SURGERY — IRRIGATION AND DEBRIDEMENT WOUND
Anesthesia: Choice | Site: Scalp

## 2024-11-03 ENCOUNTER — Encounter: Admitting: Student

## 2024-11-04 ENCOUNTER — Encounter: Admitting: Physician Assistant

## 2024-11-09 ENCOUNTER — Encounter: Payer: Self-pay | Admitting: Family

## 2024-11-09 ENCOUNTER — Ambulatory Visit (INDEPENDENT_AMBULATORY_CARE_PROVIDER_SITE_OTHER): Payer: Self-pay | Admitting: Family

## 2024-11-09 VITALS — BP 113/69 | HR 74 | Temp 97.8°F | Ht 64.0 in | Wt 141.2 lb

## 2024-11-09 DIAGNOSIS — I10 Essential (primary) hypertension: Secondary | ICD-10-CM

## 2024-11-09 DIAGNOSIS — D692 Other nonthrombocytopenic purpura: Secondary | ICD-10-CM

## 2024-11-09 DIAGNOSIS — D509 Iron deficiency anemia, unspecified: Secondary | ICD-10-CM

## 2024-11-09 DIAGNOSIS — F411 Generalized anxiety disorder: Secondary | ICD-10-CM

## 2024-11-09 DIAGNOSIS — G4700 Insomnia, unspecified: Secondary | ICD-10-CM

## 2024-11-09 DIAGNOSIS — Z944 Liver transplant status: Secondary | ICD-10-CM | POA: Diagnosis not present

## 2024-11-09 DIAGNOSIS — Z79899 Other long term (current) drug therapy: Secondary | ICD-10-CM | POA: Diagnosis not present

## 2024-11-09 DIAGNOSIS — K219 Gastro-esophageal reflux disease without esophagitis: Secondary | ICD-10-CM

## 2024-11-09 DIAGNOSIS — K743 Primary biliary cirrhosis: Secondary | ICD-10-CM

## 2024-11-09 DIAGNOSIS — K7682 Hepatic encephalopathy: Secondary | ICD-10-CM

## 2024-11-09 DIAGNOSIS — F1320 Sedative, hypnotic or anxiolytic dependence, uncomplicated: Secondary | ICD-10-CM | POA: Diagnosis not present

## 2024-11-09 DIAGNOSIS — K31819 Angiodysplasia of stomach and duodenum without bleeding: Secondary | ICD-10-CM | POA: Diagnosis not present

## 2024-11-09 DIAGNOSIS — Z15068 Genetic susceptibility to other malignant neoplasm of digestive system: Secondary | ICD-10-CM

## 2024-11-09 DIAGNOSIS — E039 Hypothyroidism, unspecified: Secondary | ICD-10-CM

## 2024-11-09 MED ORDER — GABAPENTIN 300 MG PO CAPS: 300.0000 mg | ORAL_CAPSULE | Freq: Three times a day (TID) | ORAL | 3 refills | Status: AC

## 2024-11-09 MED ORDER — ALPRAZOLAM 0.5 MG PO TABS: 0.5000 mg | ORAL_TABLET | Freq: Every day | ORAL | 1 refills | Status: AC

## 2024-11-09 MED ORDER — ALENDRONATE SODIUM 70 MG PO TABS: 70.0000 mg | ORAL_TABLET | ORAL | 1 refills | Status: AC

## 2024-11-09 NOTE — Patient Instructions (Signed)
 Nerve Pain (Neuropathic Pain): What to Know Nerve pain, also called neuropathic pain, happens when nerves in your body are damaged. This type of pain can make you feel more pain than usual. Even a small touch can hurt a lot. Nerve pain can last for a long time (be chronic) and can be hard to treat. The pain can be different for each person. It might: Start suddenly or slowly. Come and go as the damaged nerves heal, or it may stay the same for years. Cause stress, trouble sleeping, and make life harder. What are the causes? Many things can cause nerve damage, such as: Metabolic problems like: Diabetes. This is the most common cause. Lack of vitamins like B12. Medicines and chemicals. Nerve damage can happen from medicines that kill cancer cells (chemotherapy) or from drinking too much alcohol. Any injury that cuts, crushes, or stretches a nerve. Compression. If a nerve gets trapped or compressed for too long, the blood supply to the nerve can be cut off. Blood vessel disease. This can cause pain by decreasing blood supply and oxygen to nerves. Autoimmune diseases like lupus or multiple sclerosis. These are diseases where the body's defense system (immune system) attacks its own nerves. Infections with germs, also called viruses, such as shingles. Diseases that are passed down through families. What increases the risk? You're more likely to get nerve pain if: You have diabetes. You smoke. You drink too much alcohol. You take certain medicines, like those for cancer or immune system problems. What are the signs or symptoms? The main symptom is pain. Nerve pain is often described as: Burning. Shock-like. Stinging. Hot or cold. Itching. Tingling. Prickling. How is this diagnosed? No single test can diagnose nerve pain. It's diagnosed based on: A physical exam and your symptoms. Your health care provider will ask you about your pain. You may be asked to use a pain scale to describe how  bad your pain is. Tests to find nerve damage, like: Nerve conduction studies and EMG to check how well nerves and muscles work. Skin biopsy, which is when a small piece of skin is removed for testing. This test looks for a nerve problem called small fiber neuropathy. Imaging tests, such as: X-rays. CT scan. MRI. How is this treated? Treatment can change over time. You might need to try different treatments or a mix of them. Options include: Treating the cause such as managing diabetes or fixing vitamin levels. Stopping medicines that can cause nerve pain. Taking medicines to relieve pain. These may include: Pain medicines. Anti-seizure medicines. Antidepressant medicines. Pain-relieving patches or creams that are put on painful areas of skin. A medicine to numb the area, which can be injected as a nerve block. Transcutaneous nerve stimulation. This uses electrical currents to block painful nerve signals. The treatment is painless. Other treatments, such as: Acupuncture. Meditation. Massage. Occupational or physical therapy. Pain management programs. Counseling. Follow these instructions at home: Medicines  Take your medicines only as told. You may need to take steps to help treat or prevent trouble pooping (constipation), such as: Taking medicines to help you poop. Eating foods high in fiber, like beans, whole grains, and fresh fruits and vegetables. Drinking more fluids as told. Ask your provider if it's safe to drive or use machines while taking your medicine. Lifestyle  Have a good support system at home. Join a chronic pain support group. Consider talking with a mental health care provider about how to cope with the pain. Do not smoke, vape, or  use nicotine or tobacco. Do not drink alcohol. General instructions Learn as much as you can about your condition. Work closely with all your providers to find the treatment plan that works best for you. Ask what things are  safe for you to do at home. Exercise as told. Keep all follow-up visits. Your provider will check if the treatments are working and change them if needed. Contact a health care provider if: Your pain treatments aren't working. You're having side effects from your medicines. You feel very tired, sad, or anxious. Get help right away if: You feel like you may hurt yourself or others. You have thoughts about taking your own life. You have other thoughts or feelings that worry you. These symptoms may be an emergency. Take one of these steps right away: Go to your nearest emergency room. Call 911. Contact the Suicide & Crisis Lifeline (24/7, free and confidential): Call or text 988. Chat online at chat.NewsActor.se. For Veterans and their loved ones: Call 988 and press 1. Text the [employer] at 848 299 4225. Chat online at ReservationsList.si. This information is not intended to replace advice given to you by your health care provider. Make sure you discuss any questions you have with your health care provider. Document Revised: 01/15/2024 Document Reviewed: 01/15/2024 Elsevier Patient Education  2025 [employer].

## 2024-11-09 NOTE — Progress Notes (Signed)
 "  Subjective:    Patient ID: Cassidy Barber, female    DOB: 31-Dec-1950, 73 y.o.   MRN: [id number]  Chief Complaint  Patient presents with   Medical Management of Chronic Issues   PT presents to the office today for chronic follow up.    She is followed by [facility] for hx of liver transplant. She had an incarcerated ventral hernia with fluid in the hernia sac 04/05/18. She has been diagnosed with GAVE.   She had a bile leak and was hospitalized multiple times since 09/11/24.    She is followed by Hemologists every 3 months for iron  deficiency anemia. ERCP on 11/03/24 that looked good at that time.    She had a left reverse shoulder 07/17/18 and then had to redone in 09/27/19. She is scheduled for reverse shoulder arthroplasty on 07/20/21 on her right shoulder.    She currently has skin cancer on her scalp and is completed 30 radiation treatments then had 20 radiation treatments on another.  She is followed by dermatologists for recurrent squamous skin cancer on legs, arms, and face. Has purpura senilis. Stable. Has two mohs surgeries planned.    She has Lynch syndrome. Hypertension This is a chronic problem. The current episode started more than 1 year ago. The problem has been resolved since onset. The problem is controlled. Associated symptoms include anxiety and malaise/fatigue. Pertinent negatives include no peripheral edema (every now and then) or shortness of breath. Risk factors for coronary artery disease include dyslipidemia, sedentary lifestyle and post-menopausal state. The current treatment provides moderate improvement. Identifiable causes of hypertension include a thyroid  problem.  Gastroesophageal Reflux She complains of belching, heartburn and a hoarse voice. This is a chronic problem. The current episode started more than 1 year ago. The problem occurs occasionally. The symptoms are aggravated by certain foods. Associated symptoms include fatigue. Risk factors include obesity. She  has tried a PPI for the symptoms. The treatment provided moderate relief.  Thyroid  Problem Presents for follow-up visit. Symptoms include anxiety, diarrhea, dry skin, fatigue and hoarse voice. Patient reports no constipation. The symptoms have been stable.  Anemia Presents for follow-up visit. Symptoms include malaise/fatigue.  Insomnia Primary symptoms: difficulty falling asleep, malaise/fatigue.   The current episode started more than one year. The onset quality is gradual. The problem occurs intermittently. Past treatments include meditation. The treatment provided moderate relief.  Anxiety Presents for follow-up visit. Symptoms include excessive worry, insomnia, nervous/anxious behavior and restlessness. Patient reports no shortness of breath. Symptoms occur occasionally. The severity of symptoms is mild.        Review of Systems  Constitutional:  Positive for fatigue and malaise/fatigue.  HENT:  Positive for hoarse voice.   Respiratory:  Negative for shortness of breath.   Gastrointestinal:  Positive for diarrhea and heartburn. Negative for constipation.  Psychiatric/Behavioral:  The patient is nervous/anxious and has insomnia.   All other systems reviewed and are negative.  Family History  Problem Relation Age of Onset   Prostate cancer Father    Colon cancer Father    Colon cancer Sister    Lung cancer Sister    Healthy Son    Alcohol abuse Brother    Allergic rhinitis Neg Hx    Asthma Neg Hx    Eczema Neg Hx    Urticaria Neg Hx    Social History   Socioeconomic History   Marital status: Married    Spouse name: Johnny   Number of children: 1   Years  of education: 12   Highest education level: 12th grade  Occupational History   Occupation: Disability    Employer: [employer]    Comment: Immunologist  Tobacco Use   Smoking status: Never   Smokeless tobacco: Never  Vaping Use   Vaping status: Never Used  Substance and Sexual Activity   Alcohol  use: No   Drug use: No   Sexual activity: Not Currently    Birth control/protection: None  Other Topics Concern   Not on file  Social History Narrative   Patient lives in a two story home with her husband. She has an adult son. She is retired from being an environmental health practitioner for 30 years.    Social Drivers of Health   Tobacco Use: Low Risk (11/09/2024)   Patient History    Smoking Tobacco Use: Never    Smokeless Tobacco Use: Never    Passive Exposure: Not on file  Financial Resource Strain: Low Risk (11/06/2024)   Overall Financial Resource Strain (CARDIA)    Difficulty of Paying Living Expenses: Not hard at all  Food Insecurity: No Food Insecurity (11/06/2024)   Epic    Worried About Radiation Protection Practitioner of Food in the Last Year: Never true    Ran Out of Food in the Last Year: Never true  Transportation Needs: No Transportation Needs (11/06/2024)   Epic    Lack of Transportation (Medical): No    Lack of Transportation (Non-Medical): No  Physical Activity: Insufficiently Active (11/06/2024)   Exercise Vital Sign    Days of Exercise per Week: 2 days    Minutes of Exercise per Session: 20 min  Stress: No Stress Concern Present (11/06/2024)   [employer] of Occupational Health - Occupational Stress Questionnaire    Feeling of Stress: Not at all  Social Connections: Moderately Integrated (11/06/2024)   Social Connection and Isolation Panel    Frequency of Communication with Friends and Family: More than three times a week    Frequency of Social Gatherings with Friends and Family: Three times a week    Attends Religious Services: More than 4 times per year    Active Member of Clubs or Organizations: No    Attends Banker Meetings: Not on file    Marital Status: Married  Depression (PHQ2-9): Low Risk (11/09/2024)   Depression (PHQ2-9)    PHQ-2 Score: 0  Alcohol Screen: Low Risk (05/08/2024)   Alcohol Screen    Last Alcohol Screening Score (AUDIT): 1  Housing:  Unknown (11/06/2024)   Epic    Unable to Pay for Housing in the Last Year: No    Number of Times Moved in the Last Year: Not on file    Homeless in the Last Year: No  Utilities: Not At Risk (09/25/2024)   Epic    Threatened with loss of utilities: No  Health Literacy: Adequate Health Literacy (07/04/2023)   B1300 Health Literacy    Frequency of need for help with medical instructions: Never        Objective:   Physical Exam Vitals reviewed.  Constitutional:      General: She is not in acute distress.    Appearance: She is well-developed.  HENT:     Head: Normocephalic and atraumatic.  Eyes:     Pupils: Pupils are equal, round, and reactive to light.  Neck:     Thyroid : No thyromegaly.  Cardiovascular:     Rate and Rhythm: Normal rate and regular rhythm.     Heart  sounds: Normal heart sounds. No murmur heard. Pulmonary:     Effort: Pulmonary effort is normal. No respiratory distress.     Breath sounds: Normal breath sounds. No wheezing.  Abdominal:     General: Bowel sounds are normal. There is no distension.     Palpations: Abdomen is soft.     Tenderness: There is no abdominal tenderness.  Musculoskeletal:        General: No tenderness. Normal range of motion.     Cervical back: Normal range of motion and neck supple.  Skin:     Neurological:     Mental Status: She is alert and oriented to person, place, and time.     Cranial Nerves: No cranial nerve deficit.     Deep Tendon Reflexes: Reflexes are normal and symmetric.  Psychiatric:        Behavior: Behavior normal.        Thought Content: Thought content normal.        Judgment: Judgment normal.       BP 113/69   Pulse 74   Temp 97.8 F (36.6 C) (Temporal)   Ht 5' 4 (1.626 m)   Wt 141 lb 3.2 oz (64 kg)   SpO2 99%   BMI 24.24 kg/m      Assessment & Plan:  MOZELLE REMLINGER comes in today with chief complaint of Medical Management of Chronic Issues   Diagnosis and orders addressed:  1. GAD  (generalized anxiety disorder) - ALPRAZolam  (XANAX ) 0.5 MG tablet; Take 1 tablet (0.5 mg total) by mouth at bedtime.  Dispense: 90 tablet; Refill: 1 - CMP14+EGFR  2. Benzodiazepine dependence (HCC) - ALPRAZolam  (XANAX ) 0.5 MG tablet; Take 1 tablet (0.5 mg total) by mouth at bedtime.  Dispense: 90 tablet; Refill: 1 - ToxASSURE Select 13 (MW), Urine - CMP14+EGFR  3. Controlled substance agreement signed - ALPRAZolam  (XANAX ) 0.5 MG tablet; Take 1 tablet (0.5 mg total) by mouth at bedtime.  Dispense: 90 tablet; Refill: 1 - ToxASSURE Select 13 (MW), Urine - CMP14+EGFR  4. Gastroesophageal reflux disease, unspecified whether esophagitis present (Primary) - CMP14+EGFR  5. GAVE (gastric antral vascular ectasia)  - CMP14+EGFR  6. Purpura senilis - CMP14+EGFR  7. Primary biliary cholangitis (HCC)  - CMP14+EGFR  8. Lynch syndrome - CMP14+EGFR  9. Iron  deficiency anemia, unspecified iron  deficiency anemia type - Anemia Profile B - CMP14+EGFR  10. Insomnia, unspecified type  - CMP14+EGFR  11. Hepatic encephalopathy (HCC) - CMP14+EGFR  12. Hypothyroidism, unspecified type - CMP14+EGFR - Thyroid  Panel With TSH  13. Hx of liver transplant (HCC) - CMP14+EGFR  14. Primary hypertension  - CMP14+EGFR   Labs reviewed from specialist  Continue Current medications  Patient reviewed in [place] controlled database, no flags noted. Contract and drug screen up dated today.  Keep follow up with specialists  Health Maintenance reviewed Diet and exercise encouraged  Follow up plan: 6 months    Bari Learn, FNP   "

## 2024-11-10 LAB — ANEMIA PROFILE B
Basophils Absolute: 0.1 x10E3/uL (ref 0.0–0.2)
Basos: 1 %
EOS (ABSOLUTE): 0.2 x10E3/uL (ref 0.0–0.4)
Eos: 3 %
Ferritin: 145 ng/mL (ref 15–150)
Folate: 3.9 ng/mL
Hematocrit: 42.3 % (ref 34.0–46.6)
Hemoglobin: 13 g/dL (ref 11.1–15.9)
Immature Grans (Abs): 0 x10E3/uL (ref 0.0–0.1)
Immature Granulocytes: 0 %
Iron Saturation: 18 % (ref 15–55)
Iron: 47 ug/dL (ref 27–139)
Lymphocytes Absolute: 2.9 x10E3/uL (ref 0.7–3.1)
Lymphs: 45 %
MCH: 28.4 pg (ref 26.6–33.0)
MCHC: 30.7 g/dL — ABNORMAL LOW (ref 31.5–35.7)
MCV: 92 fL (ref 79–97)
Monocytes Absolute: 0.8 x10E3/uL (ref 0.1–0.9)
Monocytes: 12 %
Neutrophils Absolute: 2.5 x10E3/uL (ref 1.4–7.0)
Neutrophils: 39 %
Platelets: 356 x10E3/uL (ref 150–450)
RBC: 4.58 x10E6/uL (ref 3.77–5.28)
RDW: 14.4 % (ref 11.7–15.4)
Retic Ct Pct: 1.5 % (ref 0.6–2.6)
Total Iron Binding Capacity: 261 ug/dL (ref 250–450)
UIBC: 214 ug/dL (ref 118–369)
Vitamin B-12: 1411 pg/mL — AB (ref 232–1245)
WBC: 6.4 x10E3/uL (ref 3.4–10.8)

## 2024-11-10 LAB — CMP14+EGFR
ALT: 16 IU/L (ref 0–32)
AST: 29 IU/L (ref 0–40)
Albumin: 3.7 g/dL — ABNORMAL LOW (ref 3.8–4.8)
Alkaline Phosphatase: 211 IU/L — ABNORMAL HIGH (ref 49–135)
BUN/Creatinine Ratio: 15 (ref 12–28)
BUN: 12 mg/dL (ref 8–27)
Bilirubin Total: 0.3 mg/dL (ref 0.0–1.2)
CO2: 22 mmol/L (ref 20–29)
Calcium: 9 mg/dL (ref 8.7–10.3)
Chloride: 101 mmol/L (ref 96–106)
Creatinine, Ser: 0.78 mg/dL (ref 0.57–1.00)
Globulin, Total: 3.5 g/dL (ref 1.5–4.5)
Glucose: 88 mg/dL (ref 70–99)
Potassium: 5.6 mmol/L — ABNORMAL HIGH (ref 3.5–5.2)
Sodium: 137 mmol/L (ref 134–144)
Total Protein: 7.2 g/dL (ref 6.0–8.5)
eGFR: 80 mL/min/1.73

## 2024-11-10 LAB — THYROID PANEL WITH TSH
Free Thyroxine Index: 3 (ref 1.2–4.9)
T3 Uptake Ratio: 36 % (ref 24–39)
T4, Total: 8.3 ug/dL (ref 4.5–12.0)
TSH: 3.5 u[IU]/mL (ref 0.450–4.500)

## 2024-11-11 ENCOUNTER — Inpatient Hospital Stay: Attending: Hematology

## 2024-11-11 ENCOUNTER — Other Ambulatory Visit: Payer: Self-pay | Admitting: Family

## 2024-11-11 DIAGNOSIS — D638 Anemia in other chronic diseases classified elsewhere: Secondary | ICD-10-CM

## 2024-11-11 DIAGNOSIS — D509 Iron deficiency anemia, unspecified: Secondary | ICD-10-CM | POA: Diagnosis present

## 2024-11-11 DIAGNOSIS — R1013 Epigastric pain: Secondary | ICD-10-CM

## 2024-11-11 DIAGNOSIS — E538 Deficiency of other specified B group vitamins: Secondary | ICD-10-CM | POA: Diagnosis not present

## 2024-11-11 DIAGNOSIS — D500 Iron deficiency anemia secondary to blood loss (chronic): Secondary | ICD-10-CM

## 2024-11-11 DIAGNOSIS — K219 Gastro-esophageal reflux disease without esophagitis: Secondary | ICD-10-CM

## 2024-11-11 DIAGNOSIS — B998 Other infectious disease: Secondary | ICD-10-CM

## 2024-11-11 LAB — TOXASSURE SELECT 13 (MW), URINE

## 2024-11-11 LAB — CBC WITH DIFFERENTIAL/PLATELET
Abs Immature Granulocytes: 0.03 K/uL (ref 0.00–0.07)
Basophils Absolute: 0.1 K/uL (ref 0.0–0.1)
Basophils Relative: 1 %
Eosinophils Absolute: 0.3 K/uL (ref 0.0–0.5)
Eosinophils Relative: 3 %
HCT: 42.3 % (ref 36.0–46.0)
Hemoglobin: 12.8 g/dL (ref 12.0–15.0)
Immature Granulocytes: 0 %
Lymphocytes Relative: 50 %
Lymphs Abs: 4.4 K/uL — ABNORMAL HIGH (ref 0.7–4.0)
MCH: 28.4 pg (ref 26.0–34.0)
MCHC: 30.3 g/dL (ref 30.0–36.0)
MCV: 94 fL (ref 80.0–100.0)
Monocytes Absolute: 1.1 K/uL — ABNORMAL HIGH (ref 0.1–1.0)
Monocytes Relative: 12 %
Neutro Abs: 3 K/uL (ref 1.7–7.7)
Neutrophils Relative %: 34 %
Platelets: 372 K/uL (ref 150–400)
RBC: 4.5 MIL/uL (ref 3.87–5.11)
RDW: 16.1 % — ABNORMAL HIGH (ref 11.5–15.5)
WBC: 8.9 K/uL (ref 4.0–10.5)
nRBC: 0 % (ref 0.0–0.2)

## 2024-11-11 LAB — IRON AND TIBC
Iron: 64 ug/dL (ref 28–170)
Saturation Ratios: 23 % (ref 10.4–31.8)
TIBC: 277 ug/dL (ref 250–450)
UIBC: 213 ug/dL

## 2024-11-11 LAB — FERRITIN: Ferritin: 154 ng/mL (ref 11–307)

## 2024-11-11 LAB — COMPREHENSIVE METABOLIC PANEL WITH GFR
ALT: 14 U/L (ref 0–44)
AST: 28 U/L (ref 15–41)
Albumin: 3.9 g/dL (ref 3.5–5.0)
Alkaline Phosphatase: 208 U/L — ABNORMAL HIGH (ref 38–126)
Anion gap: 15 (ref 5–15)
BUN: 14 mg/dL (ref 8–23)
CO2: 22 mmol/L (ref 22–32)
Calcium: 9 mg/dL (ref 8.9–10.3)
Chloride: 101 mmol/L (ref 98–111)
Creatinine, Ser: 0.8 mg/dL (ref 0.44–1.00)
GFR, Estimated: >60 mL/min
Glucose, Bld: 108 mg/dL — ABNORMAL HIGH (ref 70–99)
Potassium: 4.5 mmol/L (ref 3.5–5.1)
Sodium: 138 mmol/L (ref 135–145)
Total Bilirubin: 0.4 mg/dL (ref 0.0–1.2)
Total Protein: 8.1 g/dL (ref 6.5–8.1)

## 2024-11-11 LAB — VITAMIN B12: Vitamin B-12: 1203 pg/mL — ABNORMAL HIGH (ref 180–914)

## 2024-11-15 LAB — METHYLMALONIC ACID, SERUM: Methylmalonic Acid, Quantitative: 175 nmol/L (ref 0–378)

## 2024-11-16 ENCOUNTER — Telehealth: Payer: Self-pay | Admitting: Plastic Surgery

## 2024-11-16 NOTE — Telephone Encounter (Signed)
 Pt called and wants to sch surgery that she had to cancel back in November 2025.  Thank you.

## 2024-11-18 ENCOUNTER — Inpatient Hospital Stay

## 2024-11-18 ENCOUNTER — Inpatient Hospital Stay: Attending: Hematology | Admitting: Oncology

## 2024-11-18 VITALS — BP 135/72 | HR 85 | Temp 98.0°F | Resp 18 | Ht 64.0 in | Wt 143.0 lb

## 2024-11-18 DIAGNOSIS — D500 Iron deficiency anemia secondary to blood loss (chronic): Secondary | ICD-10-CM

## 2024-11-18 DIAGNOSIS — E538 Deficiency of other specified B group vitamins: Secondary | ICD-10-CM

## 2024-11-18 DIAGNOSIS — Z944 Liver transplant status: Secondary | ICD-10-CM

## 2024-11-18 DIAGNOSIS — C4440 Unspecified malignant neoplasm of skin of scalp and neck: Secondary | ICD-10-CM | POA: Diagnosis not present

## 2024-11-18 DIAGNOSIS — C189 Malignant neoplasm of colon, unspecified: Secondary | ICD-10-CM | POA: Diagnosis not present

## 2024-11-18 LAB — VITAMIN D 25 HYDROXY (VIT D DEFICIENCY, FRACTURES): Vit D, 25-Hydroxy: 54 ng/mL (ref 30–100)

## 2024-11-18 NOTE — Assessment & Plan Note (Addendum)
 History of liver transplant at [facility]. Has labs drawn with them every 2 to 3 weeks.

## 2024-11-18 NOTE — Assessment & Plan Note (Addendum)
 Patient receives intermittent IV iron .  Etiology of iron  deficiency is likely from malabsorption and slow GI bleed. Patient received 3 doses of IV Venofer  on 12/20/2023, 12/27/2023 and 01/03/2024. Most recent colonoscopy is from 03/17/2024 which showed normal stomach and duodenum, normal ileum and patent end-to-end ileocolonic anastomosis. She denies any bright red blood per rectum, melena or hematochezia. We reviewed most recent lab results which showed more or less stable iron  levels. Unable to tolerate oral iron . Does not feel like iron  made a huge difference at her last visit. No additional IV iron  needed at this time.

## 2024-11-18 NOTE — Assessment & Plan Note (Addendum)
 History of colon cancer and is positive for Lynch syndrome. She is followed by GI for this.

## 2024-11-18 NOTE — Progress Notes (Signed)
 "  [facility] OFFICE PROGRESS NOTE  Cassidy Barber LABOR, FNP  ASSESSMENT & PLAN:    Assessment & Plan Iron  deficiency anemia due to chronic blood loss Patient receives intermittent IV iron .  Etiology of iron  deficiency is likely from malabsorption and slow GI bleed. Patient received 3 doses of IV Venofer  on 12/20/2023, 12/27/2023 and 01/03/2024. Most recent colonoscopy is from 03/17/2024 which showed normal stomach and duodenum, normal ileum and patent end-to-end ileocolonic anastomosis. She denies any bright red blood per rectum, melena or hematochezia. We reviewed most recent lab results which showed more or less stable iron  levels. Unable to tolerate oral iron . Does not feel like iron  made a huge difference at her last visit. No additional IV iron  needed at this time.  Hx of liver transplant [facility]) History of liver transplant at [facility]. Has labs drawn with them every 2 to 3 weeks. Malignant neoplasm of colon, unspecified part of colon (HCC) History of colon cancer and is positive for Lynch syndrome. She is followed by GI for this. Skin cancer of scalp - Secondary to chronic immunosuppression for liver transplant; lynch syndrome - She is status post 30 treatments of radiation to her scalp. - She is followed by dermatology and has had several areas treated recently with topical creams. - Plastic surgery for excision of scalp wound on 05/13/2023 B12 deficiency Current B12 level is 1203 which is elevated.  MMA is normal. Recommend reducing B12 down to every other day if not 3 times per week to avoid significant elevation.  Orders Placed This Encounter  Procedures   Comprehensive metabolic panel    Standing Status:   Future    Expected Date:   05/19/2025    Expiration Date:   08/19/2025    Has the patient fasted?:   No    Release to patient:   Immediate   Vitamin B12    Standing Status:   Future    Expected Date:   05/19/2025    Expiration Date:   08/19/2025    Methylmalonic acid, serum    Standing Status:   Future    Expected Date:   05/19/2025    Expiration Date:   08/19/2025   CBC with Differential/Platelet    Standing Status:   Future    Expected Date:   05/19/2025    Expiration Date:   08/19/2025    Release to patient:   Immediate   Iron  and TIBC    Standing Status:   Future    Expected Date:   05/19/2025    Expiration Date:   08/19/2025    Release to patient:   Immediate   Ferritin    Standing Status:   Future    Expected Date:   05/19/2025    Expiration Date:   08/19/2025    Release to patient:   Immediate   Vitamin D  25 hydroxy    Standing Status:   Future    Number of Occurrences:   1    Expected Date:   11/18/2024    Expiration Date:   11/18/2025    INTERVAL HISTORY: Patient returns for follow-up for history of iron  deficiency anemia.  Patient receives intermittent IV iron  last given on 12/20/2023, 12/27/2023 and 01/03/2024 (300 mg IV Venofer ).  Patient underwent EGD and colonoscopy on 03/17/2024 which showed normal stomach and duodenum, normal ileum and patent end-to-end ileocolonic anastomosis.  She reports persistent baseline fatigue with energy levels that come and go.  In the bright red  blood per rectum, hematochezia or melena.  She has occasional intermittent loose dark stools.  She has frequent Mohs surgery for removal of squamous cell skin cancer related to immunosuppression from transplant.  Patient was admitted from 09/16/2024 through 09/23/2024 at [facility] for post liver biopsy biliary leak requiring JP drain placement.  She was hospitalized again from 09/29/2024 through 10/06/2024 at [facility] for biliary leak and liver biopsy.  New drain was placed by IR.  She was scheduled to follow-up with GI on 10/12/2024.  She still has a drain placed in her epigastric region and has follow-up frequently with [facility].  Patient had a biliary stent removed on 11/03/2024.  Reports appetite and energy levels are 100%.  Reports she is still trying to recover from her  hospital stay back in November.  Patient is curious about her vitamin D  level.  Reports that has not been drawn in quite some time.  Reports that somebody took her off her vitamin D  50,000 units once weekly and would like that level checked.  We reviewed CBC, CMP, B12, MMA, iron  panel, ferritin.  SUMMARY OF HEMATOLOGIC HISTORY: Oncology History  Iron  deficiency anemia   - Unable to tolerate oral iron . - Secondary to history of colon cancer s/p abdominal colectomy, malabsorption, and chronic blood loss from GAVE -  Most recent EGD (02/26/2023) showed GAVE without bleeding, treated with APC. - Most recent sigmoidoscopy (02/26/2023): Patent end-to-side ileocolonic anastomosis with healthy-appearing mucosa; nonbleeding internal hemorrhoids - SPEP negative in 2019 - She last received IV iron  (Venofer  300 mg x 3) in December 2023  - Currently symptomatic with ongoing fatigue and mild DOE - no improvement in symptoms after IV iron  - Dark stools ever since her colectomy.  Denies any epistaxis or rectal hemorrhage  CBC    Component Value Date/Time   WBC 8.9 11/11/2024 0907   RBC 4.50 11/11/2024 0907   HGB 12.8 11/11/2024 0907   HGB 13.0 11/09/2024 0902   HCT 42.3 11/11/2024 0907   HCT 42.3 11/09/2024 0902   PLT 372 11/11/2024 0907   PLT 356 11/09/2024 0902   MCV 94.0 11/11/2024 0907   MCV 92 11/09/2024 0902   MCH 28.4 11/11/2024 0907   MCHC 30.3 11/11/2024 0907   RDW 16.1 (H) 11/11/2024 0907   RDW 14.4 11/09/2024 0902   LYMPHSABS 4.4 (H) 11/11/2024 0907   LYMPHSABS 2.9 11/09/2024 0902   MONOABS 1.1 (H) 11/11/2024 0907   EOSABS 0.3 11/11/2024 0907   EOSABS 0.2 11/09/2024 0902   BASOSABS 0.1 11/11/2024 0907   BASOSABS 0.1 11/09/2024 0902       Latest Ref Rng & Units 11/11/2024    9:07 AM 11/09/2024    9:02 AM 01/28/2024   11:48 AM  CMP  Glucose 70 - 99 mg/dL 891  88  95   BUN 8 - 23 mg/dL 14  12  17    Creatinine 0.44 - 1.00 mg/dL 9.19  9.21  9.24   Sodium 135 - 145 mmol/L 138   137  136   Potassium 3.5 - 5.1 mmol/L 4.5  5.6  5.1   Chloride 98 - 111 mmol/L 101  101  100   CO2 22 - 32 mmol/L 22  22  21    Calcium 8.9 - 10.3 mg/dL 9.0  9.0  9.6   Total Protein 6.5 - 8.1 g/dL 8.1  7.2  7.6   Total Bilirubin 0.0 - 1.2 mg/dL 0.4  0.3  0.4   Alkaline Phos 38 - 126 U/L 208  211  295   AST 15 - 41 U/L 28  29  31    ALT 0 - 44 U/L 14  16  17       Lab Results  Component Value Date   FERRITIN 154 11/11/2024   VITAMINB12 1,203 (H) 11/11/2024    Vitals:   11/18/24 0923  BP: 135/72  Pulse: 85  Resp: 18  Temp: 98 F (36.7 C)  SpO2: 98%    Review of System:  Review of Systems  Constitutional:  Positive for malaise/fatigue.  Gastrointestinal:  Positive for diarrhea.    Physical Exam: Physical Exam Constitutional:      Appearance: Normal appearance.  HENT:     Head: Normocephalic and atraumatic.  Eyes:     Pupils: Pupils are equal, round, and reactive to light.  Cardiovascular:     Rate and Rhythm: Normal rate and regular rhythm.     Heart sounds: Normal heart sounds. No murmur heard. Pulmonary:     Effort: Pulmonary effort is normal.     Breath sounds: Normal breath sounds. No wheezing.  Abdominal:     General: Bowel sounds are normal. There is no distension.     Palpations: Abdomen is soft.     Tenderness: There is no abdominal tenderness.  Musculoskeletal:        General: Normal range of motion.     Cervical back: Normal range of motion.  Skin:    General: Skin is warm and dry.     Findings: No rash.  Neurological:     Mental Status: She is alert and oriented to person, place, and time.     Gait: Gait is intact.  Psychiatric:        Mood and Affect: Mood and affect normal.        Cognition and Memory: Memory normal.        Judgment: Judgment normal.      I spent 25 minutes dedicated to the care of this patient (face-to-face and non-face-to-face) on the date of the encounter to include what is described in the assessment and  plan.,  Delon Hope, NP 11/18/2024 12:43 PM "

## 2024-11-18 NOTE — Assessment & Plan Note (Addendum)
-   Secondary to chronic immunosuppression for liver transplant; lynch syndrome - She is status post 30 treatments of radiation to her scalp. - She is followed by dermatology and has had several areas treated recently with topical creams. - Plastic surgery for excision of scalp wound on 05/13/2023

## 2024-11-19 ENCOUNTER — Ambulatory Visit: Payer: Self-pay | Admitting: Family

## 2024-11-20 ENCOUNTER — Encounter: Admitting: Plastic Surgery

## 2024-11-27 ENCOUNTER — Telehealth (INDEPENDENT_AMBULATORY_CARE_PROVIDER_SITE_OTHER): Payer: Self-pay

## 2024-11-27 DIAGNOSIS — K743 Primary biliary cirrhosis: Secondary | ICD-10-CM

## 2024-11-27 MED ORDER — URSODIOL 300 MG PO CAPS: 900.0000 mg | ORAL_CAPSULE | Freq: Every day | ORAL | 5 refills | Status: AC

## 2024-11-27 NOTE — Telephone Encounter (Signed)
 Received a fax from [facility] in [place], they are asking for a refill on patient ursodiol  300 mg capsules.  Per Dr. Samuel last office visit note, dated 02/24/2024  -Continue ursodiol  900 milligrams every day.  Medication submitted to pharmacy.

## 2024-11-30 ENCOUNTER — Other Ambulatory Visit (INDEPENDENT_AMBULATORY_CARE_PROVIDER_SITE_OTHER): Payer: Self-pay | Admitting: Gastroenterology

## 2024-11-30 DIAGNOSIS — B028 Zoster with other complications: Secondary | ICD-10-CM

## 2024-11-30 DIAGNOSIS — K743 Primary biliary cirrhosis: Secondary | ICD-10-CM

## 2024-11-30 MED ORDER — URSODIOL 300 MG PO CAPS: 900.0000 mg | ORAL_CAPSULE | Freq: Every day | ORAL | 5 refills | Status: AC

## 2024-12-03 ENCOUNTER — Other Ambulatory Visit: Payer: Self-pay | Admitting: Family

## 2024-12-04 ENCOUNTER — Encounter: Admitting: Student

## 2024-12-04 ENCOUNTER — Telehealth: Payer: Self-pay | Admitting: Neurology

## 2024-12-04 NOTE — Telephone Encounter (Signed)
 error

## 2024-12-10 DIAGNOSIS — E538 Deficiency of other specified B group vitamins: Secondary | ICD-10-CM

## 2024-12-10 DIAGNOSIS — C4440 Unspecified malignant neoplasm of skin of scalp and neck: Secondary | ICD-10-CM

## 2024-12-10 DIAGNOSIS — C189 Malignant neoplasm of colon, unspecified: Secondary | ICD-10-CM

## 2024-12-10 DIAGNOSIS — Z944 Liver transplant status: Secondary | ICD-10-CM

## 2024-12-10 MED ORDER — VALACYCLOVIR HCL 500 MG PO TABS: 500.0000 mg | ORAL_TABLET | Freq: Two times a day (BID) | ORAL | 2 refills | Status: AC

## 2024-12-17 ENCOUNTER — Ambulatory Visit: Admitting: Student

## 2024-12-17 DIAGNOSIS — S0100XD Unspecified open wound of scalp, subsequent encounter: Secondary | ICD-10-CM

## 2024-12-17 MED ORDER — CEPHALEXIN 500 MG PO CAPS: 500.0000 mg | ORAL_CAPSULE | Freq: Four times a day (QID) | ORAL | 0 refills | Status: AC

## 2024-12-17 MED ORDER — ONDANSETRON HCL 4 MG PO TABS: 4.0000 mg | ORAL_TABLET | Freq: Three times a day (TID) | ORAL | 0 refills | Status: AC | PRN

## 2024-12-17 MED ORDER — HYDROCODONE-ACETAMINOPHEN 5-325 MG PO TABS: 1.0000 | ORAL_TABLET | Freq: Four times a day (QID) | ORAL | 0 refills | Status: AC | PRN

## 2024-12-17 NOTE — Progress Notes (Cosign Needed)
 "    Patient ID: Cassidy Barber, female    DOB: Mar 17, 1951, 74 y.o.   MRN: [id number]  Preoperative Appointment     ICD-10-CM   1. Open wound of scalp, unspecified open wound type, subsequent encounter  S01.00XD        History of Present Illness: Cassidy Barber is a 74 y.o.  female  with a history of scalp wound.  She presents for preoperative evaluation for upcoming procedure, excision of scalp wound with bur and myriad placement, scheduled for 12/31/2024 with Dr. Lowery.  The patient has not had problems with anesthesia.  The patient denies any history of cardiac disease.  She denies taking any blood thinners.  Patient reports she is not a smoker.  Patient denies taking any birth control or hormone replacement.  She denies any history of greater than 3 miscarriages.  She denies any personal family history of blood clots or clotting diseases.  She denies any recent surgeries, traumas or infections.  She denies any history of stroke or heart attack.  She denies any history of Crohn's disease or ulcerative colitis, COPD or asthma.  She does report history of cancer.  She denies any varicosities to her lower extremities.  She denies any recent fevers, chills or changes in her health.  Patient reports that she is currently taking sirolimus  for history of liver transplant.  She states that she was switched to tacrolimus  before her last surgery, she states that she does not want to be switched to this again.  We did discuss that she could have wound healing difficulties with the sirolimus .  Patient expressed understanding.  Summary of Previous Visit: Patient was last seen in the clinic by Dr. Lowery on 08/18/2024.  At this visit, it was noted that patient was seen a year prior with an open area on her scalp due to excision of skin cancer.  It was noted that patient was on immunosuppressive therapy for liver transplant, and her scalp was radiated and that she did not have skin in that area.  Patient  underwent debridement with myriad placement in July 2024.  Patient healed after that surgery.  At this visit though, patient did noticed a new wound in the area over the past month or so and about half of it was open with bone exposed.  In total it was about 2 x 2 cm and the bone exposure was 1 x 1 cm.  Plan was to go to the operating room for debridement and placement of myriad of the scalp wound.  Job: Does not work at this time  PMH Significant for: Hypertension, colon cancer, GERD, hypothyroidism, scalp wound, history of liver transplant   Past Medical History: Allergies: Allergies[1]  Current Medications: Current Medications[2]  Past Medical Problems: Past Medical History:  Diagnosis Date   Abdominal wall hernia    Incarcerated status post surgical repair 2019 - [facility]   Anemia of chronic disease    Atypical nevus 01/21/2018   atypical neoplasm- Left scalp-ant (txpbx + MOHS), atypical neoplasm- Left scalp post- (txpbx + MOHS)   Basal cell carcinoma    Colon cancer (HCC)    Colon surgery 2005 and 2012   History of pulmonary hypertension    Pre liver transplant   Hypertension    Hypothyroidism    Lynch syndrome    Osteopenia    Primary biliary cirrhosis (HCC)    Status post liver transplantation - follows at [facility]   SCCA (squamous cell carcinoma) of skin  02/23/2020   Right Upper Chest(moderate) (MOH's)   SCCA (squamous cell carcinoma) of skin 04/07/2020   Left Top Leg (Keratoacanthoma) treatment after biopsy   SCCA (squamous cell carcinoma) of skin 04/07/2020   Left Foot Dorsal (in situ) treatment after biopsy   SCCA (squamous cell carcinoma) of skin 03/27/2021   Right Upper Back (Keratoacanthoma) (excision) (clear)   SCCA (squamous cell carcinoma) of skin 06/13/2021   Right Shoulder - anterior (moderately differentiated) (tx p bx)   SCCA (squamous cell carcinoma) of skin 06/13/2021   Right Thigh - anterior (well differentiated) (tx p bx)   SCCA (squamous cell carcinoma)  of skin 06/13/2021   Right Lower Leg - anterior (well differentiated) (tx p bx)   Squamous cell carcinoma of skin 04/22/2018   KA-Right mid chest (txpbx), KA-left elbow crease (txpbx), insitu-Right mid chest inf. (exc)   Squamous cell carcinoma of skin 05/20/2018   well diff-Left upper shin (txpbx), well diff-Right lower forearm (txpbx), well diff-Right upper shin (txpbx)   Squamous cell carcinoma of skin 06/11/2018   Scc + margin-Right mid chest inferior    Squamous cell carcinoma of skin 06/27/2018   well diff-Left mid thigh(txpbx), well diff-Left inner thigh (txpbx),insitu-Right cheek (txpbx),well diff-right inner heel (txpbx)   Squamous cell carcinoma of skin 09/16/2018   well diff-left shoulder (txpbx), well diff-Right chin (txpbx), well diff-right chest lateral (CX35FU)   Squamous cell carcinoma of skin 10/01/2018   Right outer lower shin (Txpbx)   Squamous cell carcinoma of skin 04/03/2019   well diff-Right center chest (MOHS), in situ-Right ear   Squamous cell carcinoma of skin 08/05/2019   in situ-Left calf (txpbx), in situ-left bicep (txpbx), well diff-Left chest,inf(txpbx), in situ-Right chest inf-(txpbx)   Squamous cell carcinoma of skin 11/19/2019   KA-left top leg (txpbx), modify-Riight forehead-(mohs), in situ-right hand (txpbx), in situ-Right forearm (txpbx), well diff-Right chest (txpbx), well diff-chin (txpbx)   Squamous cell carcinoma of skin 01/06/2020   KA- Left top leg   Squamous cell carcinoma of skin 05/12/2013   bowens-middle of chest (CX35FU)   Squamous cell carcinoma of skin 05/18/2015   well diff-Left upper arm (CX35FU + Exc),KA-right chest(txpbx), in situ-Left shin (txpbx), well diff-Right cheek (CX35FU), KA-Left post scalp (CX35FU)   Squamous cell carcinoma of skin 08/09/2015   KA-Left post scalp ((MOHS), in situ- mid chest (Txpbx +exc), in situ-Left upper arm inferior (txpbx)   Squamous cell carcinoma of skin 10/13/2015   Left upper arm-clear   Squamous  cell carcinoma of skin 03/09/2016   mod diff-mid chest (txpbx+ exc), mod diff-Right chest (txpbx+exc), well diff-right cheek-(txpbx),well diff-Left hand-(txpbx), in situ-Left upper arm (txpbx), well diff-Right cheek -(txpbx), well diff-Right crease arm (txpbx)    Squamous cell carcinoma of skin 05/24/2016   well diff-Right nasal crease-(MOHS)   Squamous cell carcinoma of skin 08/02/2016   KA-Left chest med (txpbx)   Squamous cell carcinoma of skin 08/30/2016   in situ-Left outer zygoma (txpbx)   Squamous cell carcinoma of skin 12/06/2016   well diff-Left chest sup, Left shoudler, insitu- right post scalp   Squamous cell carcinoma of skin 02/14/2017   well diff-Left forearm (EXC),in situ-RIght ant neck   Squamous cell carcinoma of skin 06/14/2017   in situ-Right forearm (txpbx), in situ-Right chest (txpbx), well diff-left chest (txpbx), well diff-anterior neck- (txpbx)   Squamous cell carcinoma of skin 08/07/2017   well diff-Left upper shoulder (txpbx), sup and invasive-Left temple (txpbx), well diff-Right upper shin (txpbx), in situ-Right clavicle (txpbx)   Squamous  cell carcinoma of skin 10/17/2017   well diff-ant. neck (MOHS), in situ-Right chest, inf (txpbx)   Squamous cell carcinoma of skin 01/21/2018   well diff- Right chest,ulnar (txpbx), well diff- right upper chest (txpbx), in situ-Right ant. crown (txpbx)   Squamous cell carcinoma of skin 08/02/2020   well diff-left lower leg-inferior (Txpbx)   Squamous cell carcinoma of skin 08/02/2020   well diff-right lower leg-mid (txpbx)   Squamous cell carcinoma of skin 08/02/2020   well diff-left chest upper   Squamous cell carcinoma of skin 08/02/2020   well diff-mid parietal scalp (MOHS)   Squamous cell carcinoma of skin 08/02/2020   well diff-right foot inner(txpbx)   Squamous cell carcinoma of skin 08/02/2020   well diff- left lower leg medial (txpbx)   Squamous cell carcinoma of skin 08/02/2020   well diff-left lower leg anterior  (txpbx)   Squamous cell carcinoma of skin 08/02/2020   well diff-left lower leg medial (txpbx)   Squamous cell carcinoma of skin 08/02/2020   well diff-right forearm-posterior (txpbx)    Past Surgical History: Past Surgical History:  Procedure Laterality Date   ABDOMINAL HERNIA REPAIR     Patient's states that she has had 8- 9 hernia surgeries   ABDOMINAL HYSTERECTOMY     BIOPSY  02/15/2021   Procedure: BIOPSY;  Surgeon: Golda Claudis PENNER, MD;  [facility];  Service: Endoscopy;;   BIOPSY  12/20/2021   Procedure: BIOPSY;  Surgeon: Golda Claudis PENNER, MD;  [facility];  Service: Endoscopy;;   CATARACT EXTRACTION W/PHACO Right 01/15/2020   Procedure: CATARACT EXTRACTION PHACO AND INTRAOCULAR LENS PLACEMENT (IOC);  Surgeon: Harrie Agent, MD;  [facility]: [facility];  Service: Ophthalmology;  Laterality: Right;  CDE: 7.89   CATARACT EXTRACTION W/PHACO Left 01/29/2020   Procedure: CATARACT EXTRACTION PHACO AND INTRAOCULAR LENS PLACEMENT (IOC) (CDE: 6.33);  Surgeon: Harrie Agent, MD;  Location: [facility];  Service: Ophthalmology;  Laterality: Left;   CHOLECYSTECTOMY  2007   COLON SURGERY  2008   Done at [facility]   COLONOSCOPY     Done at [facility]   COLONOSCOPY N/A 03/17/2024   Procedure: COLONOSCOPY;  Surgeon: Eartha Angelia Sieving, MD;  [facility];  Service: Gastroenterology;  Laterality: N/A;  7:30A,;ASA 3   ESOPHAGOGASTRODUODENOSCOPY N/A 08/21/2018   Procedure: ESOPHAGOGASTRODUODENOSCOPY (EGD);  Surgeon: Golda Claudis PENNER, MD;  [facility];  Service: Endoscopy;  Laterality: N/A;   ESOPHAGOGASTRODUODENOSCOPY N/A 03/17/2024   Procedure: EGD (ESOPHAGOGASTRODUODENOSCOPY);  Surgeon: Eartha Angelia, Sieving, MD;  [facility];  Service: Gastroenterology;  Laterality: N/A;  7:30AM;ASA 3   ESOPHAGOGASTRODUODENOSCOPY (EGD) WITH PROPOFOL  N/A 12/16/2019   Procedure: ESOPHAGOGASTRODUODENOSCOPY (EGD) WITH PROPOFOL ;  Surgeon: Golda Claudis PENNER, MD;  [facility];  Service: Endoscopy;  Laterality: N/A;   ESOPHAGOGASTRODUODENOSCOPY (EGD) WITH PROPOFOL  N/A 12/20/2021   Procedure: ESOPHAGOGASTRODUODENOSCOPY (EGD) WITH PROPOFOL ;  Surgeon: Golda Claudis PENNER, MD;  [facility];  Service: Endoscopy;  Laterality: N/A;  9:05   ESOPHAGOGASTRODUODENOSCOPY (EGD) WITH PROPOFOL  N/A 02/14/2022   Procedure: ESOPHAGOGASTRODUODENOSCOPY (EGD) WITH PROPOFOL ;  Surgeon: Golda Claudis PENNER, MD;  [facility];  Service: Endoscopy;  Laterality: N/A;  730   ESOPHAGOGASTRODUODENOSCOPY (EGD) WITH PROPOFOL  N/A 06/04/2022   Procedure: ESOPHAGOGASTRODUODENOSCOPY (EGD) WITH PROPOFOL ;  Surgeon: Eartha Angelia Sieving, MD;  [facility];  Service: Gastroenterology;  Laterality: N/A;  145   ESOPHAGOGASTRODUODENOSCOPY (EGD) WITH PROPOFOL  N/A 02/26/2023   Procedure: ESOPHAGOGASTRODUODENOSCOPY (EGD) WITH PROPOFOL ;  Surgeon: Eartha Angelia,  Toribio, MD;  [facility];  Service: Gastroenterology;  Laterality: N/A;  9:15AM;ASA 3   EYE SURGERY     lasix    FLEXIBLE SIGMOIDOSCOPY N/A 10/20/2015   Procedure: FLEXIBLE SIGMOIDOSCOPY;  Surgeon: Claudis RAYMOND Rivet, MD;  [facility];  Service: Endoscopy;  Laterality: N/A;  58 - Dr Rivet has meeting until 1:00   FLEXIBLE SIGMOIDOSCOPY N/A 07/11/2016   Procedure: FLEXIBLE SIGMOIDOSCOPY;  Surgeon: Claudis RAYMOND Rivet, MD;  [facility];  Service: Endoscopy;  Laterality: N/A;  1200   FLEXIBLE SIGMOIDOSCOPY N/A 08/09/2017   Procedure: FLEXIBLE SIGMOIDOSCOPY;  Surgeon: Rivet Claudis RAYMOND, MD;  [facility];  Service: Endoscopy;  Laterality: N/A;  1:00   FLEXIBLE SIGMOIDOSCOPY N/A 08/21/2018   Procedure: FLEXIBLE SIGMOIDOSCOPY;  Surgeon: Rivet Claudis RAYMOND, MD;  [facility];  Service: Endoscopy;  Laterality: N/A;   FLEXIBLE SIGMOIDOSCOPY N/A 12/16/2019   Procedure: FLEXIBLE SIGMOIDOSCOPY wirh Propofol ;  Surgeon: Rivet Claudis RAYMOND, MD;  [facility];  Service:  Endoscopy;  Laterality: N/A;  7:30   FLEXIBLE SIGMOIDOSCOPY N/A 02/15/2021   Procedure: FLEXIBLE SIGMOIDOSCOPY WITH PROPOFOL ;  Surgeon: Rivet Claudis RAYMOND, MD;  [facility];  Service: Endoscopy;  Laterality: N/A;  am   FLEXIBLE SIGMOIDOSCOPY N/A 12/20/2021   Procedure: FLEXIBLE SIGMOIDOSCOPY;  Surgeon: Rivet Claudis RAYMOND, MD;  [facility];  Service: Endoscopy;  Laterality: N/A;   FLEXIBLE SIGMOIDOSCOPY N/A 02/26/2023   Procedure: FLEXIBLE SIGMOIDOSCOPY;  Surgeon: Eartha Angelia Toribio, MD;  [facility];  Service: Gastroenterology;  Laterality: N/A;  9:15AM; ASA 3   FRACTURE SURGERY     right wrist metal plate   GI RADIOFREQUENCY ABLATION N/A 02/14/2022   Procedure: GI RADIOFREQUENCY ABLATION;  Surgeon: Rivet Claudis RAYMOND, MD;  [facility];  Service: Endoscopy;  Laterality: N/A;   HOT HEMOSTASIS N/A 12/20/2021   Procedure: HOT HEMOSTASIS (ARGON PLASMA COAGULATION/BICAP);  Surgeon: Rivet Claudis RAYMOND, MD;  [facility];  Service: Endoscopy;  Laterality: N/A;   HOT HEMOSTASIS  06/04/2022   Procedure: HOT HEMOSTASIS (ARGON PLASMA COAGULATION/BICAP);  Surgeon: Eartha Angelia, Toribio, MD;  [facility];  Service: Gastroenterology;;   HOT HEMOSTASIS  02/26/2023   Procedure: HOT HEMOSTASIS (ARGON PLASMA COAGULATION/BICAP);  Surgeon: Eartha Angelia, Toribio, MD;  [facility];  Service: Gastroenterology;;   IRRIGATION AND DEBRIDEMENT OF WOUND WITH SPLIT THICKNESS SKIN GRAFT N/A 05/13/2023   Procedure: Excision of scalp wound with Myriad or Acell placement;  Surgeon: Lowery Estefana RAMAN, DO;  Location: [facility];  Service: Plastics;  Laterality: N/A;   LIVER TRANSPLANT  11/19/2013   POLYPECTOMY  08/09/2017   Procedure: POLYPECTOMY;  Surgeon: Rivet Claudis RAYMOND, MD;  [facility];  Service: Endoscopy;;  colon small bowel   POLYPECTOMY N/A 02/14/2022   Procedure: POLYPECTOMY;  Surgeon: Rivet Claudis RAYMOND, MD;   [facility];  Service: Endoscopy;  Laterality: N/A;   REVERSE SHOULDER ARTHROPLASTY Left 07/17/2018   Procedure: LEFT REVERSE SHOULDER ARTHROPLASTY;  Surgeon: Melita Drivers, MD;  Location: [facility];  Service: Orthopedics;  Laterality: Left;    REVERSE SHOULDER ARTHROPLASTY Right 07/20/2021   Procedure: REVERSE SHOULDER ARTHROPLASTY;  Surgeon: Melita Drivers, MD;  Location: [facility];  Service: Orthopedics;  Laterality: Right;   SHOULDER CLOSED REDUCTION Left 09/27/2019   Procedure: CLOSED REDUCTION SHOULDER;  Surgeon: Ernie Cough, MD;  Location: [facility];  Service: Orthopedics;  Laterality: Left;   SPLENECTOMY  2006  TOTAL SHOULDER REVISION Left 11/12/2019   Procedure: Revision Left Reverse Shoulder Arthroplasty with poly exchange SDD;  Surgeon: Melita Drivers, MD;  Location: [facility];  Service: Orthopedics;  Laterality: Left;  -SDDC   TYMPANOSTOMY TUBE PLACEMENT     UPPER GASTROINTESTINAL ENDOSCOPY     Done at [facility]    Social History: Social History   Socioeconomic History   Marital status: Married    Spouse name: Johnny   Number of children: 1   Years of education: 12   Highest education level: 12th grade  Occupational History   Occupation: Disability    Employer: [employer]    Comment: Immunologist  Tobacco Use   Smoking status: Never   Smokeless tobacco: Never  Vaping Use   Vaping status: Never Used  Substance and Sexual Activity   Alcohol use: No   Drug use: No   Sexual activity: Not Currently    Birth control/protection: None  Other Topics Concern   Not on file  Social History Narrative   Patient lives in a two story home with her husband. She has an adult son. She is retired from being an environmental health practitioner for 30 years.    Social Drivers of Health   Tobacco Use: Low Risk  (12/04/2024)   Received from [facility]   Patient History    Smoking Tobacco Use: Never    Smokeless Tobacco Use: Never    Passive  Exposure: Not on file  Financial Resource Strain: Low Risk (11/06/2024)   Overall Financial Resource Strain (CARDIA)    Difficulty of Paying Living Expenses: Not hard at all  Food Insecurity: No Food Insecurity (11/06/2024)   Epic    Worried About Radiation Protection Practitioner of Food in the Last Year: Never true    Ran Out of Food in the Last Year: Never true  Transportation Needs: No Transportation Needs (11/06/2024)   Epic    Lack of Transportation (Medical): No    Lack of Transportation (Non-Medical): No  Physical Activity: Insufficiently Active (11/06/2024)   Exercise Vital Sign    Days of Exercise per Week: 2 days    Minutes of Exercise per Session: 20 min  Stress: No Stress Concern Present (11/06/2024)   [employer] of Occupational Health - Occupational Stress Questionnaire    Feeling of Stress: Not at all  Social Connections: Moderately Integrated (11/06/2024)   Social Connection and Isolation Panel    Frequency of Communication with Friends and Family: More than three times a week    Frequency of Social Gatherings with Friends and Family: Three times a week    Attends Religious Services: More than 4 times per year    Active Member of Clubs or Organizations: No    Attends Banker Meetings: Not on file    Marital Status: Married  Intimate Partner Violence: Not At Risk (09/25/2024)   Epic    Fear of Current or Ex-Partner: No    Emotionally Abused: No    Physically Abused: No    Sexually Abused: No  Depression (PHQ2-9): Low Risk (11/18/2024)   Depression (PHQ2-9)    PHQ-2 Score: 0  Alcohol Screen: Low Risk (05/08/2024)   Alcohol Screen    Last Alcohol Screening Score (AUDIT): 1  Housing: Unknown (11/06/2024)   Epic    Unable to Pay for Housing in the Last Year: No    Number of Times Moved in the Last Year: Not on file    Homeless in the Last Year:  No  Utilities: Not At Risk (09/25/2024)   Epic    Threatened with loss of utilities: No  Health Literacy: Adequate Health  Literacy (07/04/2023)   B1300 Health Literacy    Frequency of need for help with medical instructions: Never    Family History: Family History  Problem Relation Age of Onset   Prostate cancer Father    Colon cancer Father    Colon cancer Sister    Lung cancer Sister    Healthy Son    Alcohol abuse Brother    Allergic rhinitis Neg Hx    Asthma Neg Hx    Eczema Neg Hx    Urticaria Neg Hx     Review of Systems: Denies any recent fevers, chills or changes in her health  Physical Exam: Patient is speaking in full and clear sentences    Assessment/Plan: The patient is scheduled for excision of scalp wound with bur and myriad placement with Dr. Lowery.  Risks, benefits, and alternatives of procedure discussed, questions answered and consent obtained.    Will send clearance to patient's PCP.  Clearance form was given to staff to fax off.  Smoking Status: Non-smoker; Counseling Given?  N/A  Caprini Score: 7; Risk Factors include: Age, BMI greater than 25, history of malignancy and length of planned surgery. Recommendation for mechanical and possible pharmacological prophylaxis. Encourage early ambulation.  Will discuss the possibility of postoperative Lovenox  with Dr. Lowery.  Pictures obtained: @consult   Post-op Rx sent to pharmacy: Norco, Keflex , Zofran -patient states that she has taken Norco in the past without any issue.  Patient was provided with the General Surgical Risk consent document and Pain Medication Agreement prior to their appointment.  They had adequate time to read through the risk consent documents and Pain Medication Agreement. We also discussed them together during this preop appointment. All of their questions were answered to their satisfaction.  Recommended calling if they have any further questions.  The consent was obtained with risks and complications reviewed which included bleeding, pain, scar, infection and the risk of anesthesia.  The patients  questions were answered to the patients expressed satisfaction.   The patient gave consent to have this visit done by telemedicine / virtual visit, two identifiers were used to identify patient. This is also consent for access the chart and treat the patient via this visit. The patient is located at home.  I, the provider, am at the office.  We spent 18 minutes together for the visit.  Joined by telephone.    Electronically signed by: Estefana FORBES Peck, PA-C 12/17/2024 5:20 PM      [1]  Allergies Allergen Reactions   Oxycodone  Nausea Only and Dermatitis   Ciprofloxacin  Itching   Codeine Nausea Only  [2]  Current Outpatient Medications:    cephALEXin  (KEFLEX ) 500 MG capsule, Take 1 capsule (500 mg total) by mouth 4 (four) times daily for 3 days., Disp: 12 capsule, Rfl: 0   HYDROcodone -acetaminophen  (NORCO/VICODIN) 5-325 MG tablet, Take 1 tablet by mouth every 6 (six) hours as needed for up to 10 doses for moderate pain (pain score 4-6)., Disp: 10 tablet, Rfl: 0   ondansetron  (ZOFRAN ) 4 MG tablet, Take 1 tablet (4 mg total) by mouth every 8 (eight) hours as needed for up to 10 doses for nausea or vomiting., Disp: 10 tablet, Rfl: 0   acetaminophen  (TYLENOL ) 500 MG tablet, Take 1,000 mg by mouth every 6 (six) hours as needed (for pain.). , Disp: , Rfl:  alendronate  (FOSAMAX ) 70 MG tablet, Take 1 tablet (70 mg total) by mouth once a week., Disp: 12 tablet, Rfl: 1   ALPRAZolam  (XANAX ) 0.5 MG tablet, Take 1 tablet (0.5 mg total) by mouth at bedtime., Disp: 90 tablet, Rfl: 1   Calcium Carb-Cholecalciferol  (CALCIUM 600 + D PO), Take 1 tablet by mouth 2 (two) times daily., Disp: , Rfl:    cetirizine  (ZYRTEC ) 10 MG tablet, Take 1 tablet (10 mg total) by mouth daily as needed for allergies., Disp: 90 tablet, Rfl: 3   Cholecalciferol  (VITAMIN D ) 50 MCG (2000 UT) tablet, Take 2,000 Units by mouth daily., Disp: , Rfl:    Clobetasol  Prop Emollient Base (CLOBETASOL  PROPIONATE E) 0.05 % emollient cream,  Apply 1 application. topically 2 (two) times daily. (Patient taking differently: Apply 1 application  topically 2 (two) times daily as needed (rash).), Disp: 180 g, Rfl: 1   clotrimazole -betamethasone  (LOTRISONE ) cream, Apply 1 application topically 2 (two) times daily. (Patient taking differently: Apply 1 application  topically daily as needed (Rash).), Disp: 30 g, Rfl: 0   cyanocobalamin  (VITAMIN B12) 1000 MCG tablet, Take 1 tablet (1,000 mcg total) by mouth daily., Disp: 90 tablet, Rfl: 3   famotidine  (PEPCID ) 20 MG tablet, TAKE ONE (1) TABLET BY MOUTH TWO (2) TIMES DAILY, Disp: 60 tablet, Rfl: 5   fluorouracil (EFUDEX) 5 % cream, Apply 1 application topically daily as needed (cancer spots). , Disp: , Rfl: 0   fluticasone  (FLONASE ) 50 MCG/ACT nasal spray, USE 2 SPRAYS IN EACH NOSTRIL DAILY, Disp: 16 g, Rfl: 6   gabapentin  (NEURONTIN ) 300 MG capsule, Take 1 capsule (300 mg total) by mouth 3 (three) times daily., Disp: 90 capsule, Rfl: 3   hydrocortisone cream 1 %, Apply 1 Application topically 2 (two) times daily as needed for itching., Disp: , Rfl:    hydrOXYzine  (VISTARIL ) 25 MG capsule, TAKE 1 CAPSULE BY MOUTH EVERY 8 HOURS AS NEEDED, Disp: 90 capsule, Rfl: 1   ketoconazole  (NIZORAL ) 2 % cream, Apply 1 Application topically 2 (two) times daily., Disp: 15 g, Rfl: 0   levothyroxine  (SYNTHROID ) 112 MCG tablet, TAKE ONE (1) TABLET BY MOUTH EVERY DAY, Disp: 90 tablet, Rfl: 3   losartan  (COZAAR ) 25 MG tablet, Take 1 tablet (25 mg total) by mouth daily. (Patient not taking: Reported on 11/18/2024), Disp: 90 tablet, Rfl: 4   metoprolol  succinate (TOPROL -XL) 25 MG 24 hr tablet, TAKE ONE (1) TABLET BY MOUTH EVERY DAY, Disp: 90 tablet, Rfl: 1   Multiple Vitamins-Minerals (MULTIVITAMIN WITH MINERALS) tablet, Take 1 tablet by mouth daily., Disp: , Rfl:    mupirocin  ointment (BACTROBAN ) 2 %, Apply 1 application topically daily as needed (Cancer spots)., Disp: 22 g, Rfl: 3   pantoprazole  (PROTONIX ) 40 MG tablet,  TAKE ONE (1) TABLET BY MOUTH EVERY DAY, Disp: 90 tablet, Rfl: 1   Probiotic Product (PROBIOTIC PO), Take 1 capsule by mouth daily., Disp: , Rfl:    SUMAtriptan  (IMITREX ) 50 MG tablet, Take 1 tablet (50 mg total) by mouth every 2 (two) hours as needed for migraine. May repeat in 2 hours if headache persists or recurs., Disp: 10 tablet, Rfl: 0   ursodiol  (ACTIGALL ) 300 MG capsule, Take 3 capsules (900 mg total) by mouth daily., Disp: 90 capsule, Rfl: 5   valACYclovir  (VALTREX ) 500 MG tablet, Take 1 tablet (500 mg total) by mouth 2 (two) times daily., Disp: 180 tablet, Rfl: 2   vitamin C (ASCORBIC ACID) 250 MG tablet, Take 250 mg by mouth daily., Disp: ,  Rfl:    Vitamin D , Ergocalciferol , (DRISDOL ) 1.25 MG (50000 UNIT) CAPS capsule, TAKE 1 CAPSULE BY MOUTH ONCE A WEEK, Disp: 12 capsule, Rfl: 1   vitamin E 180 MG (400 UNITS) capsule, Take 400 Units by mouth daily., Disp: , Rfl:   "

## 2024-12-31 ENCOUNTER — Encounter (HOSPITAL_BASED_OUTPATIENT_CLINIC_OR_DEPARTMENT_OTHER): Admission: RE | Payer: Self-pay | Source: Home / Self Care

## 2024-12-31 ENCOUNTER — Ambulatory Visit (HOSPITAL_BASED_OUTPATIENT_CLINIC_OR_DEPARTMENT_OTHER): Admission: RE | Admit: 2024-12-31 | Source: Home / Self Care | Admitting: Plastic Surgery

## 2024-12-31 SURGERY — IRRIGATION AND DEBRIDEMENT WOUND
Anesthesia: Choice | Site: Scalp

## 2025-01-08 ENCOUNTER — Encounter: Admitting: Student

## 2025-01-19 ENCOUNTER — Encounter: Admitting: Plastic Surgery

## 2025-02-05 ENCOUNTER — Encounter: Admitting: Student

## 2025-05-19 ENCOUNTER — Inpatient Hospital Stay

## 2025-05-26 ENCOUNTER — Inpatient Hospital Stay: Admitting: Oncology

## 2025-07-08 ENCOUNTER — Ambulatory Visit: Payer: Self-pay
